# Patient Record
Sex: Male | Born: 1955 | Race: Black or African American | Hispanic: No | Smoking: Current every day smoker
Health system: Southern US, Community
[De-identification: ages and names within clinical notes are randomized; demographics above are authoritative.]

## PROBLEM LIST (undated history)

## (undated) DIAGNOSIS — I639 Cerebral infarction, unspecified: Secondary | ICD-10-CM

## (undated) DIAGNOSIS — I10 Essential (primary) hypertension: Secondary | ICD-10-CM

## (undated) DIAGNOSIS — N182 Chronic kidney disease, stage 2 (mild): Secondary | ICD-10-CM

## (undated) DIAGNOSIS — F1910 Other psychoactive substance abuse, uncomplicated: Secondary | ICD-10-CM

## (undated) DIAGNOSIS — E118 Type 2 diabetes mellitus with unspecified complications: Secondary | ICD-10-CM

## (undated) DIAGNOSIS — I5042 Chronic combined systolic (congestive) and diastolic (congestive) heart failure: Secondary | ICD-10-CM

## (undated) DIAGNOSIS — I428 Other cardiomyopathies: Secondary | ICD-10-CM

## (undated) HISTORY — DX: Chronic kidney disease, stage 2 (mild): N18.2

## (undated) HISTORY — DX: Chronic combined systolic (congestive) and diastolic (congestive) heart failure: I50.42

## (undated) HISTORY — PX: NO PAST SURGERIES: SHX2092

## (undated) HISTORY — DX: Essential (primary) hypertension: I10

## (undated) HISTORY — DX: Other cardiomyopathies: I42.8

---

## 2013-06-17 ENCOUNTER — Inpatient Hospital Stay: Payer: Self-pay | Admitting: Internal Medicine

## 2013-06-17 LAB — CBC
HCT: 46.5 % (ref 40.0–52.0)
HGB: 15.6 g/dL (ref 13.0–18.0)
MCH: 33.3 pg (ref 26.0–34.0)
MCHC: 33.5 g/dL (ref 32.0–36.0)
MCV: 99 fL (ref 80–100)
Platelet: 313 10*3/uL (ref 150–440)
RBC: 4.69 10*6/uL (ref 4.40–5.90)
RDW: 13.3 % (ref 11.5–14.5)
WBC: 4.1 10*3/uL (ref 3.8–10.6)

## 2013-06-17 LAB — COMPREHENSIVE METABOLIC PANEL
ALBUMIN: 3.2 g/dL — AB (ref 3.4–5.0)
ALK PHOS: 118 U/L — AB
ALT: 28 U/L (ref 12–78)
Anion Gap: 6 — ABNORMAL LOW (ref 7–16)
BUN: 8 mg/dL (ref 7–18)
Bilirubin,Total: 0.4 mg/dL (ref 0.2–1.0)
CALCIUM: 9.1 mg/dL (ref 8.5–10.1)
CHLORIDE: 106 mmol/L (ref 98–107)
CO2: 27 mmol/L (ref 21–32)
CREATININE: 0.8 mg/dL (ref 0.60–1.30)
EGFR (Non-African Amer.): >60
Glucose: 191 mg/dL — ABNORMAL HIGH (ref 65–99)
Osmolality: 281 (ref 275–301)
Potassium: 3.9 mmol/L (ref 3.5–5.1)
SGOT(AST): 51 U/L — ABNORMAL HIGH (ref 15–37)
Sodium: 139 mmol/L (ref 136–145)
Total Protein: 7 g/dL (ref 6.4–8.2)

## 2013-06-17 LAB — CK TOTAL AND CKMB (NOT AT ARMC)
CK, TOTAL: 449 U/L — AB
CK, TOTAL: 411 U/L — AB
CK, Total: 407 U/L — ABNORMAL HIGH
CK-MB: 6.2 ng/mL — ABNORMAL HIGH (ref 0.5–3.6)
CK-MB: 6.9 ng/mL — AB (ref 0.5–3.6)
CK-MB: 6.8 ng/mL — ABNORMAL HIGH (ref 0.5–3.6)

## 2013-06-17 LAB — TROPONIN I
TROPONIN-I: 0.22 ng/mL — AB
Troponin-I: 0.23 ng/mL — ABNORMAL HIGH
Troponin-I: 0.22 ng/mL — ABNORMAL HIGH

## 2013-06-17 LAB — PRO B NATRIURETIC PEPTIDE: B-Type Natriuretic Peptide: 3417 pg/mL — ABNORMAL HIGH (ref 0–125)

## 2013-06-18 LAB — LIPID PANEL
CHOLESTEROL: 152 mg/dL (ref 0–200)
HDL Cholesterol: 80 mg/dL — ABNORMAL HIGH (ref 40–60)
Ldl Cholesterol, Calc: 59 mg/dL (ref 0–100)
TRIGLYCERIDES: 67 mg/dL (ref 0–200)
VLDL CHOLESTEROL, CALC: 13 mg/dL (ref 5–40)

## 2013-06-18 LAB — CBC WITH DIFFERENTIAL/PLATELET
Basophil #: 0.1 10*3/uL (ref 0.0–0.1)
Basophil %: 1.3 %
EOS ABS: 0.1 10*3/uL (ref 0.0–0.7)
EOS PCT: 1.4 %
HCT: 46.5 % (ref 40.0–52.0)
HGB: 15.7 g/dL (ref 13.0–18.0)
LYMPHS ABS: 1.5 10*3/uL (ref 1.0–3.6)
Lymphocyte %: 27.1 %
MCH: 33.3 pg (ref 26.0–34.0)
MCHC: 33.7 g/dL (ref 32.0–36.0)
MCV: 99 fL (ref 80–100)
MONO ABS: 0.9 x10 3/mm (ref 0.2–1.0)
Monocyte %: 15.7 %
Neutrophil #: 3.1 10*3/uL (ref 1.4–6.5)
Neutrophil %: 54.5 %
PLATELETS: 345 10*3/uL (ref 150–440)
RBC: 4.7 10*6/uL (ref 4.40–5.90)
RDW: 12.9 % (ref 11.5–14.5)
WBC: 5.6 10*3/uL (ref 3.8–10.6)

## 2013-06-18 LAB — BASIC METABOLIC PANEL
Anion Gap: 6 — ABNORMAL LOW (ref 7–16)
BUN: 16 mg/dL (ref 7–18)
CO2: 30 mmol/L (ref 21–32)
Calcium, Total: 9.5 mg/dL (ref 8.5–10.1)
Chloride: 104 mmol/L (ref 98–107)
Creatinine: 1.08 mg/dL (ref 0.60–1.30)
EGFR (Non-African Amer.): >60
Glucose: 44 mg/dL — ABNORMAL LOW (ref 65–99)
Osmolality: 278 (ref 275–301)
POTASSIUM: 3.2 mmol/L — AB (ref 3.5–5.1)
SODIUM: 140 mmol/L (ref 136–145)

## 2013-06-18 LAB — HEMOGLOBIN A1C: HEMOGLOBIN A1C: 11.9 % — AB (ref 4.2–6.3)

## 2013-06-19 LAB — BASIC METABOLIC PANEL
Anion Gap: 6 — ABNORMAL LOW (ref 7–16)
BUN: 17 mg/dL (ref 7–18)
Calcium, Total: 9 mg/dL (ref 8.5–10.1)
Chloride: 100 mmol/L (ref 98–107)
Co2: 30 mmol/L (ref 21–32)
Creatinine: 0.92 mg/dL (ref 0.60–1.30)
EGFR (African American): >60
Glucose: 138 mg/dL — ABNORMAL HIGH (ref 65–99)
Osmolality: 276 (ref 275–301)
Potassium: 3.9 mmol/L (ref 3.5–5.1)
SODIUM: 136 mmol/L (ref 136–145)

## 2013-06-20 LAB — BASIC METABOLIC PANEL
Anion Gap: 6 — ABNORMAL LOW (ref 7–16)
BUN: 15 mg/dL (ref 7–18)
CHLORIDE: 99 mmol/L (ref 98–107)
CO2: 30 mmol/L (ref 21–32)
CREATININE: 0.86 mg/dL (ref 0.60–1.30)
Calcium, Total: 9 mg/dL (ref 8.5–10.1)
EGFR (Non-African Amer.): >60
Glucose: 145 mg/dL — ABNORMAL HIGH (ref 65–99)
Osmolality: 274 (ref 275–301)
Potassium: 4.3 mmol/L (ref 3.5–5.1)
Sodium: 135 mmol/L — ABNORMAL LOW (ref 136–145)

## 2013-06-20 LAB — MAGNESIUM: Magnesium: 1.5 mg/dL — ABNORMAL LOW

## 2013-06-20 LAB — DIGOXIN LEVEL: Digoxin: 0.57 ng/mL

## 2014-04-27 NOTE — H&P (Signed)
PATIENT NAME:  Shawn [name], Shawn [name] MR#:  [id number] DATE OF BIRTH:  1955/09/06  DATE OF ADMISSION:  06/17/2013  REFERRING PHYSICIAN: Dr. Jasmine December.   FAMILY PHYSICIAN: , [employer].   REASON FOR ADMISSION: New onset congestive heart failure.   HISTORY OF PRESENT ILLNESS: The patient is a 59 year old male with a history of hyperlipidemia, benign hypertension, diabetes, alcohol and tobacco abuse, who presents to the Emergency Room with a 3 to 4 day history of worsening shortness of breath, cough orthopnea, and peripheral edema. In the Emergency Room, the patient was noted to be hypertensive. His EKG suggested lateral ischemia. Chest x-ray suggests pulmonary edema. His troponin is elevated. He denies any previous cardiac history. He does drink alcohol and had at least a pint of alcohol yesterday. Smoked up until this morning. He is now admitted for further evaluation.   PAST MEDICAL HISTORY: 1. Chronic obstructive pulmonary disease/tobacco abuse.  2. Type 2 diabetes, on insulin.  3. Benign hypertension.  4. Hyperlipidemia.  5. Alcohol abuse.   MEDICATIONS: 1. Aspirin 81 mg p.o. daily.  2. Novolin 70/30 insulin 30 units subcutaneous at bedtime.  3. Lipitor 10 mg p.o. daily.  4. Lisinopril 5 mg p.o. daily.   ALLERGIES: No known drug allergies.   SOCIAL HISTORY: The patient has a current history of both alcohol and tobacco abuse.   FAMILY HISTORY: Positive coronary artery disease, diabetes, hypertension, stroke. Negative for prostate or colon cancer.   REVIEW OF SYSTEMS:  CONSTITUTIONAL: No fever or change in weight.  EYES: No blurred or double vision. No glaucoma.  ENT: No tinnitus or hearing loss. No nasal discharge or bleeding. No difficulty swallowing.  RESPIRATORY: The patient has had cough, but denies wheezing or hemoptysis. No painful respirations.  CARDIOVASCULAR: No chest pain or palpitations. No syncope.  GASTROINTESTINAL: No nausea, vomiting, or diarrhea.  No abdominal pain. No change in bowel habits.  GENITOURINARY: No dysuria or hematuria. No incontinence.  ENDOCRINE: No polyuria or polydipsia. No heat or cold intolerance.  HEMATOLOGIC: The patient denies anemia, easy bruising, or bleeding.  LYMPHATIC: No swollen glands.  MUSCULOSKELETAL: The patient denies pain in his neck, back, shoulders, knees, or hips. No gout.  NEUROLOGIC: No numbness or migraines. Denies stroke or seizures.  PSYCHIATRIC: The patient denies anxiety, insomnia or depression.   PHYSICAL EXAMINATION: GENERAL: The patient is in mild respiratory distress.  VITAL SIGNS: Remarkable for a blood pressure of 153/119, heart rate 107, respiratory rate 22, temperature 98, sat 91% on 2 liters of oxygen.  HEENT: Normocephalic, atraumatic. Pupils equally round and reactive to light and accommodation. Extraocular movements are intact. Sclerae are anicteric. Conjunctivae are clear.  OROPHARYNX: Clear.  NECK: Supple without JVD. No adenopathy or thyromegaly is noted.  LUNGS: Revealed decreased breath sounds with basilar rales. No wheezes or rhonchi. No dullness. Respiratory effort is mildly increased.  CARDIAC: Rapid rate with a regular rhythm. Normal S1 and S2. No significant rubs, murmurs or gallops. PMI is nondisplaced. Chest wall is nontender.  ABDOMEN: Soft, nontender, with normoactive bowel sounds. No organomegaly or masses were appreciated. No hernias, bruits were noted.  EXTREMITIES: Revealed 1+ edema, without clubbing or cyanosis. Pulses were 2+ bilaterally.  SKIN: Warm and dry without rash or lesions.  NEUROLOGIC: Cranial nerves II through XII grossly intact. Deep tendon reflexes were symmetric. Motor and sensory exam is nonfocal.   PSYCHIATRIC: Revealed a patient who was alert and oriented to person, place, and time. He was cooperative and used good judgment.   LABORATORY DATA:  EKG revealed sinus tachycardia with lateral T wave inversion, consistent with ischemia. No ST  elevation was noted. Chest x-ray revealed COPD changes with pulmonary edema. His white count was 4.1 with a hemoglobin of 15.6. Glucose 191 with a BUN of 8, creatinine of 0.8 with a GFR of greater than 60. His BNP was 3417 with a troponin of 0.23.   ASSESSMENT: 1. Acute congestive heart failure, presumed systolic.  2. Elevated troponin consistent with non-ST elevation myocardial infarction.  3. Chronic obstructive pulmonary disease/tobacco abuse.  4. Alcohol abuse.  5. Tachycardia.  6. Benign hypertension.  7. Type 2 diabetes.  8. Hyperlipidemia.   PLAN: The patient will be admitted to telemetry as a full code with aspirin, Lovenox, topical nitrates, beta blocker therapy, and IV Lasix. We will continue his lisinopril. We will continue his insulin and add sliding scale insulin as needed. We will follow serial cardiac enzymes and obtain an echocardiogram. We will consult cardiology. CIWA protocol because of his alcohol abuse. Follow-up chest x-ray and labs in the morning including an A1c and a lipid panel. Wean oxygen as tolerated. Further treatment and evaluation will depend upon the patient's progress.   TOTAL TIME SPENT ON THIS PATIENT: 50 minutes.    ____________________________ Leonie Douglas Doy Hutching, MD jds:sg D: 06/17/2013 12:12:21 ET T: 06/17/2013 13:33:49 ET JOB#: [id number]  cc: Leonie Douglas. Doy Hutching, MD, <Dictator> Mallorey Odonell Lennice Sites MD ELECTRONICALLY SIGNED 06/17/2013 16:10

## 2014-04-27 NOTE — Discharge Summary (Signed)
PATIENT NAME:  Shawn [name], Shawn [name] MR#:  [id number] DATE OF BIRTH:  December 01, 1955  DATE OF ADMISSION:  06/17/2013 DATE OF DISCHARGE:  06/20/2013  PRIMARY CARE PHYSICIAN:  [facility].   CARDIOLOGIST: Dionisio David, MD  FINAL DIAGNOSES: 1.  Acute respiratory failure which resolved.  2.  Acute systolic congestive heart failure with severe dilated cardiomyopathy.  3.  Elevated troponin which is demand ischemia.  4.  Accelerated hypertension.  5.  Diabetes.  6.  Hypokalemia.  7.  Hypomagnesemia.  8.  Tobacco abuse.  9.  Alcohol abuse.   MEDICATIONS ON DISCHARGE: Include Lipitor 20 mg half tablet in the morning, aspirin 81 mg daily. One-a-Day multivitamin daily, lisinopril 20 mg daily, Novolin 70/30, 20 units subcutaneous injection twice a day; spironolactone 25 mg daily, digoxin 250 mcg daily, Coreg 6.25 mg twice a day, furosemide 40 mg daily, hydralazine 10 mg 3 times a day.   TREATMENT: Life vest as directed. May remove dressing for cardiac catheterization tomorrow and shower.   DIET: Low-sodium, carbohydrate-controlled diet, regular consistency.   ACTIVITY: As tolerated.   FOLLOWUP: With Dr. Chancy Milroy, cardiology, Friday at 10:00 a.m., 2 to 4 weeks at the [facility]: The patient was admitted 06/17/2013 and discharged 06/20/2013. Came in with new-onset congestive heart failure and was started on IV Lasix. Cardiology consultation was obtained and he was put on CIWA protocol.   LABORATORY, DIAGNOSTIC AND RADIOLOGICAL DATA DURING THE HOSPITAL COURSE: Included a chest x-ray that showed chronic obstructive pulmonary disease, bibasilar atelectasis and/or infiltrates, small bilateral pleural effusions, interstitial edema. CPK 449. Glucose 191, BUN 8, creatinine 0.80, sodium 139, potassium 3.9, chloride 106, CO2 of 27, calcium 9.1. Liver function tests: Alkaline phosphatase 118, ALT 28, AST 51, albumin low at 3.2. BNP 3417, troponin borderline at 0.23. White blood cell count 4.1,  hemoglobin and hematocrit 15.6 and 46.5, platelet count of 313. EKG sinus tachycardia, left atrial enlargement and nonspecific ST-T wave changes laterally. Troponin stayed borderline at 0.22 for the next few readings. Hemoglobin A1c 11.9. LDL 59, HDL 80, triglycerides 67. Repeat chest x-ray showed mild congestive heart failure, increasing size of bilateral pleural effusions, suspected underlying emphysema. Echocardiogram showed an ejection fraction of less than 20%, moderately dilated atrium. Cardiac catheterization: The patient has normal coronaries with severely dilated left ventricle and ejection fraction 10%, Magnesium upon discharge 1.5, potassium 3.5, creatinine 0.86, glucose in the morning 145.   HOSPITAL COURSE PER PROBLEM LIST:  1.  For the patient's acute respiratory failure, the patient was unable to adequately keep oxygen saturations in an acceptable level. The patient needed oxygen supplementation during the hospital course. Upon discharge, pulse oximetry normal range. No need for oxygen upon discharge. Acute respiratory failure had resolved.  2.  Acute systolic congestive heart failure with severe cardiomyopathy, dilated. The patient did have normal coronary arteries via cardiac catheterization. His severely dilated cardiomyopathy is likely secondary to alcohol. He was advised to stop drinking. The patient was diuresed with IV Lasix initially, switched over to oral Lasix. He was started on Coreg. The patient takes lisinopril at home, spironolactone and digoxin and hydralazine was added.  3.  Accelerated hypertension. The patient was started on quite a few new blood pressure medications. Blood pressure upon discharge in the morning was 129/69.  4.  Diabetes. I increased his 70/30 insulin to twice a day, 20 units. His hemoglobin A1c is elevated, poor control as outpatient.  5.  Initial hypokalemia. Since I titrated up his lisinopril  and added spironolactone, he will likely not need potassium  supplementation upon discharge.  6.  Hypomagnesemia, likely secondary to alcohol. This was supplemented prior to discharge.  7.  Tobacco abuse. Smoking cessation counseling was done on admission.  8.  Alcohol abuse. I told him that if he wants to survive he must stop the alcohol. A life vest was prescribed for him prior to going home which should have been fitted prior to going home.   TIME SPENT ON DISCHARGE: 35 minutes.   ____________________________ Tana Conch. Leslye Peer, MD rjw:cs D: 06/20/2013 16:56:53 ET T: 06/20/2013 18:12:30 ET JOB#: [id number]  cc: Tana Conch. Leslye Peer, MD, <Dictator> Dionisio David, MD [place] SIGNED 06/22/2013 16:17

## 2014-04-27 NOTE — Consult Note (Signed)
PATIENT NAME:  Shawn [name], Shawn [name] MR#:  [id number] DATE OF BIRTH:  November 22, 1955  DATE OF CONSULTATION:  06/17/2013  CONSULTING PHYSICIAN:  Dionisio David, MD  INDICATION FOR CONSULTATION: Elevated troponin and chest pain and congestive heart failure.   HISTORY OF PRESENT ILLNESS: This is a 59 year old African American male with a past medical history of hypertension, diabetes, hyperlipidemia, presented to the hospital with few days onset of shortness of breath, orthopnea, PND and swelling of the legs. He was having chest pain today also and, thus, was seen in the Emergency Room where his troponins were elevated. Thus, I was asked to evaluate the patient. The patient denies any chest pain right now. Appears to be very short of breath, sitting up using two pillows.   PAST MEDICAL HISTORY: History of chronic obstructive pulmonary disease, history of EtOH abuse, history of smoking 1 pack per day   SOCIAL HISTORY: He smokes about 1 pack per day as mentioned. Also has a history of drinking 1 pint of beer over the weekend. He is normally followed by Dr. Rosario Jacks.   PHYSICAL EXAMINATION: GENERAL: He is alert and oriented, in mild distress due to shortness of breath.  VITAL SIGNS: Showed respirations of 23. His blood pressure is 120/70. He is afebrile.  NECK:  Reveals 8 cm JVD.  LUNGS: There is crepitation at the bases.  HEART: Tachycardic. Normal S1, S2. No audible murmur.  ABDOMEN: Soft with no tenderness.  EXTREMITIES: 2+ pedal edema.   LABORATORIES AND STUDIES: EKG shows sinus tachycardia at 101 beats per minute, left atrial enlargement, nonspecific ST-T changes. His labs showed troponin of 0.23. BNP of 3417, BUN and creatinine are normal.   ASSESSMENT AND PLAN: The patient has a non-STEMI, congestive heart failure,  hypertension, diabetes, hyperlipidemia, presents now with new onset of congestive heart failure. We will get an echocardiogram. Also we will do a cardiac catheterization to evaluate   coronary artery disease. Agree with current management.   Thank you very much for the referral   ____________________________ Dionisio David, MD sak:sg D: 06/17/2013 12:31:53 ET T: 06/17/2013 13:15:53 ET JOB#: [id number]  cc: Dionisio David, MD, <Dictator> Dionisio David MD ELECTRONICALLY SIGNED 07/18/2013 9:21

## 2014-04-27 NOTE — Discharge Summary (Signed)
PATIENT NAME:  Shawn [name], Shawn [name] MR#:  [id number] DATE OF BIRTH:  [birth year]  DATE OF ADMISSION:  06/17/2013 DATE OF DISCHARGE:  06/20/2013  ADDENDUM   The patient was discharged 06/20/2013. I was informed by the care manager that the patient signed out against medical advice prior to the [facility] arriving. The patient understands the risk that the LifeVest is there to shock him if his heart goes into arrhythmia. The patient will follow up with Dr. Neoma Laming on Thursday at 10 a.m. to try to obtain the LifeVest. We are still waiting approval for the LifeVest from the [place], [place].   ____________________________ Tana Conch. Leslye Peer, MD rjw:gb D: 06/21/2013 15:22:02 ET T: 06/21/2013 23:49:16 ET JOB#: [id number]  cc: Tana Conch. Leslye Peer, MD, <Dictator> [facility] Dionisio David, MD Marisue Brooklyn MD ELECTRONICALLY SIGNED 06/22/2013 16:18

## 2015-07-31 ENCOUNTER — Other Ambulatory Visit: Payer: Self-pay | Admitting: Obstetrics and Gynecology

## 2015-07-31 DIAGNOSIS — I509 Heart failure, unspecified: Secondary | ICD-10-CM

## 2015-08-12 ENCOUNTER — Ambulatory Visit: Payer: Disability Insurance | Attending: Obstetrics and Gynecology

## 2015-08-12 DIAGNOSIS — Z029 Encounter for administrative examinations, unspecified: Secondary | ICD-10-CM | POA: Diagnosis not present

## 2015-08-12 DIAGNOSIS — I509 Heart failure, unspecified: Secondary | ICD-10-CM

## 2015-08-12 MED ORDER — ALBUTEROL SULFATE (2.5 MG/3ML) 0.083% IN NEBU
2.5000 mg | INHALATION_SOLUTION | Freq: Once | RESPIRATORY_TRACT | Status: AC
  Administered 2015-08-12: 2.5 mg via RESPIRATORY_TRACT
  Filled 2015-08-12: qty 3

## 2015-08-21 DIAGNOSIS — I509 Heart failure, unspecified: Secondary | ICD-10-CM

## 2017-01-06 ENCOUNTER — Emergency Department
Admission: EM | Admit: 2017-01-06 | Discharge: 2017-01-06 | Disposition: A | Discharge status: Home / Self Care | Payer: Non-veteran care | Attending: Emergency Medicine | Admitting: Emergency Medicine

## 2017-01-06 ENCOUNTER — Other Ambulatory Visit: Payer: Self-pay

## 2017-01-06 DIAGNOSIS — R112 Nausea with vomiting, unspecified: Secondary | ICD-10-CM | POA: Diagnosis present

## 2017-01-06 DIAGNOSIS — R42 Dizziness and giddiness: Secondary | ICD-10-CM | POA: Diagnosis not present

## 2017-01-06 DIAGNOSIS — W01198A Fall on same level from slipping, tripping and stumbling with subsequent striking against other object, initial encounter: Secondary | ICD-10-CM | POA: Insufficient documentation

## 2017-01-06 DIAGNOSIS — Y998 Other external cause status: Secondary | ICD-10-CM | POA: Diagnosis not present

## 2017-01-06 DIAGNOSIS — I509 Heart failure, unspecified: Secondary | ICD-10-CM | POA: Diagnosis not present

## 2017-01-06 DIAGNOSIS — R739 Hyperglycemia, unspecified: Secondary | ICD-10-CM | POA: Diagnosis not present

## 2017-01-06 DIAGNOSIS — E860 Dehydration: Secondary | ICD-10-CM | POA: Diagnosis not present

## 2017-01-06 DIAGNOSIS — E119 Type 2 diabetes mellitus without complications: Secondary | ICD-10-CM | POA: Insufficient documentation

## 2017-01-06 DIAGNOSIS — F17200 Nicotine dependence, unspecified, uncomplicated: Secondary | ICD-10-CM | POA: Diagnosis not present

## 2017-01-06 DIAGNOSIS — Y929 Unspecified place or not applicable: Secondary | ICD-10-CM | POA: Insufficient documentation

## 2017-01-06 DIAGNOSIS — Y9389 Activity, other specified: Secondary | ICD-10-CM | POA: Insufficient documentation

## 2017-01-06 LAB — URINALYSIS, COMPLETE (UACMP) WITH MICROSCOPIC
Bacteria, UA: NONE SEEN
Bilirubin Urine: NEGATIVE
HGB URINE DIPSTICK: NEGATIVE
KETONES UR: 20 mg/dL — AB
LEUKOCYTES UA: NEGATIVE
Nitrite: NEGATIVE
PH: 5 (ref 5.0–8.0)
Protein, ur: NEGATIVE mg/dL
RBC / HPF: NONE SEEN RBC/hpf (ref 0–5)
Specific Gravity, Urine: 1.023 (ref 1.005–1.030)

## 2017-01-06 LAB — CBC
HCT: 42.7 % (ref 40.0–52.0)
Hemoglobin: 14.8 g/dL (ref 13.0–18.0)
MCH: 32.5 pg (ref 26.0–34.0)
MCHC: 34.7 g/dL (ref 32.0–36.0)
MCV: 93.8 fL (ref 80.0–100.0)
Platelets: 165 10*3/uL (ref 150–440)
RBC: 4.55 MIL/uL (ref 4.40–5.90)
RDW: 13.3 % (ref 11.5–14.5)
WBC: 7.7 10*3/uL (ref 3.8–10.6)

## 2017-01-06 LAB — BASIC METABOLIC PANEL
Anion gap: 11 (ref 5–15)
Anion gap: 10 (ref 5–15)
BUN: 50 mg/dL — AB (ref 6–20)
BUN: 38 mg/dL — AB (ref 6–20)
CALCIUM: 10 mg/dL (ref 8.9–10.3)
CHLORIDE: 91 mmol/L — AB (ref 101–111)
CO2: 36 mmol/L — ABNORMAL HIGH (ref 22–32)
CO2: 33 mmol/L — ABNORMAL HIGH (ref 22–32)
CREATININE: 0.92 mg/dL (ref 0.61–1.24)
Calcium: 9.6 mg/dL (ref 8.9–10.3)
Chloride: 96 mmol/L — ABNORMAL LOW (ref 101–111)
Creatinine, Ser: 1.38 mg/dL — ABNORMAL HIGH (ref 0.61–1.24)
GFR calc Af Amer: >60 mL/min (ref >60)
GFR calc non Af Amer: 54 mL/min — ABNORMAL LOW (ref >60)
Glucose, Bld: 371 mg/dL — ABNORMAL HIGH (ref 65–99)
Glucose, Bld: 151 mg/dL — ABNORMAL HIGH (ref 65–99)
Potassium: 4.3 mmol/L (ref 3.5–5.1)
Potassium: 3.6 mmol/L (ref 3.5–5.1)
SODIUM: 138 mmol/L (ref 135–145)
SODIUM: 139 mmol/L (ref 135–145)

## 2017-01-06 LAB — GLUCOSE, CAPILLARY
GLUCOSE-CAPILLARY: 467 mg/dL — AB (ref 65–99)
GLUCOSE-CAPILLARY: 308 mg/dL — AB (ref 65–99)
Glucose-Capillary: 229 mg/dL — ABNORMAL HIGH (ref 65–99)

## 2017-01-06 LAB — TROPONIN I

## 2017-01-06 MED ORDER — SODIUM CHLORIDE 0.9 % IV BOLUS (SEPSIS)
500.0000 mL | Freq: Once | INTRAVENOUS | Status: AC
  Administered 2017-01-06: 500 mL via INTRAVENOUS

## 2017-01-06 NOTE — ED Provider Notes (Signed)
[facility] Emergency Department Provider Note  ____________________________________________  Time seen: Approximately 9:43 PM  I have reviewed the triage vital signs and the nursing notes.   HISTORY  Chief Complaint Hyperglycemia   HPI Shawn [name] is a 62 y.o. male with a history of CHF, CAD, hypertension, alcohol abuse, smoking, and diabeteswho presents for evaluation of nausea and vomiting. Patient reports that yesterday he felt very nauseated and had 5 or 6 episodes of nonbloody nonbilious emesis. No further episodes of vomiting today. His sugars have been elevated at home since yesterday. This morning he reports feeling very dizzy when he stood up and fell. He hit his head on the wall. No LOC. No headache. He is not on any blood thinners. He is feeling very dehydrated. No fever, no cough or congestion, no dysuria or hematuria, no shortness of breath, no chest pain, no diarrhea. Patient drinks on a daily basis and family thinks that he was vomiting because of alcohol abuse yesterday. Patient is followed at the [place].  Chief Complaint: dehydration, N/V Severity: severe Duration: one day Timing: all day Modifying factors: nothing makes it better or worse Associated signs/symptoms: lightheadedness, fall    Past Medical History:  Diagnosis Date  . CHF (congestive heart failure) ([place])   . Diabetes mellitus without complication ([place])      Prior to Admission medications   Not on File    Allergies Patient has no known allergies.  FH DM HTN Heart disease  Social History Social History   Tobacco Use  . Smoking status: Current Every Day Smoker  Substance Use Topics  . Alcohol use: Yes    Frequency: Never  . Drug use: Not on file    Review of Systems  Constitutional: Negative for fever. + lightheaded Eyes: Negative for visual changes. ENT: Negative for sore throat. Neck: No neck pain  Cardiovascular: Negative for chest  pain. Respiratory: Negative for shortness of breath. Gastrointestinal: Negative for abdominal pain, diarrhea. + N.V Genitourinary: Negative for dysuria. Musculoskeletal: Negative for back pain. Skin: Negative for rash. Neurological: Negative for headaches, weakness or numbness. Psych: No SI or HI  ____________________________________________   PHYSICAL EXAM:  VITAL SIGNS: ED Triage Vitals  Enc Vitals Group     BP 01/06/17 1515 122/64     Pulse Rate 01/06/17 1515 68     Resp 01/06/17 1515 16     Temp 01/06/17 1515 98.1 F (36.7 C)     Temp Source 01/06/17 1515 Oral     SpO2 01/06/17 1515 100 %     Weight 01/06/17 1515 107 lb (48.5 kg)     Height 01/06/17 1515 5\' 11"  (1.803 m)     Head Circumference --      Peak Flow --      Pain Score 01/06/17 1519 0     Pain Loc --      Pain Edu? --      Excl. in [place]? --     Constitutional: Alert and oriented. Well appearing and in no apparent distress. HEENT:      Head: Normocephalic and atraumatic with small abrasion on the R forehead      Eyes: Conjunctivae are normal. Sclera is non-icteric.       Mouth/Throat: Mucous membranes are moist.       Neck: Supple with no signs of meningismus. No c spine ttp Cardiovascular: Regular rate and rhythm. No murmurs, gallops, or rubs. 2+ symmetrical distal pulses are present in all extremities. No  JVD. Respiratory: Normal respiratory effort. Lungs are clear to auscultation bilaterally. No wheezes, crackles, or rhonchi.  Gastrointestinal: Soft, non tender, and non distended with positive bowel sounds. No rebound or guarding. Musculoskeletal: Nontender with normal range of motion in all extremities. No edema, cyanosis, or erythema of extremities. Neurologic: Normal speech and language. Face is symmetric. Moving all extremities. No gross focal neurologic deficits are appreciated. Skin: Skin is warm, dry and intact. No rash noted. Psychiatric: Mood and affect are normal. Speech and behavior are  normal.  ____________________________________________   LABS (all labs ordered are listed, but only abnormal results are displayed)  Labs Reviewed  BASIC METABOLIC PANEL - Abnormal; Notable for the following components:      Result Value   Chloride 91 (*)    CO2 36 (*)    Glucose, Bld 371 (*)    BUN 50 (*)    Creatinine, Ser 1.38 (*)    GFR calc non Af Amer 54 (*)    All other components within normal limits  URINALYSIS, COMPLETE (UACMP) WITH MICROSCOPIC - Abnormal; Notable for the following components:   Color, Urine YELLOW (*)    APPearance HAZY (*)    Glucose, UA >=500 (*)    Ketones, ur 20 (*)    Squamous Epithelial / LPF 0-5 (*)    All other components within normal limits  GLUCOSE, CAPILLARY - Abnormal; Notable for the following components:   Glucose-Capillary 467 (*)    All other components within normal limits  GLUCOSE, CAPILLARY - Abnormal; Notable for the following components:   Glucose-Capillary 308 (*)    All other components within normal limits  GLUCOSE, CAPILLARY - Abnormal; Notable for the following components:   Glucose-Capillary 229 (*)    All other components within normal limits  BASIC METABOLIC PANEL - Abnormal; Notable for the following components:   Chloride 96 (*)    CO2 33 (*)    Glucose, Bld 151 (*)    BUN 38 (*)    All other components within normal limits  CBC  TROPONIN I  CBG MONITORING, ED   ____________________________________________  EKG   ED ECG REPORT I, Rudene Re, the attending physician, personally viewed and interpreted this ECG.  Normal sinus rhythm, rate of 53, normal intervals, normal axis, no ST elevations or depressions, LVH. T-wave inversion seen in 2015 and no longer present ____________________________________________  RADIOLOGY  none ____________________________________________   PROCEDURES  Procedure(s) performed: None Procedures Critical Care performed:   None ____________________________________________   INITIAL IMPRESSION / ASSESSMENT AND PLAN / ED COURSE   62 y.o. male with a history of CHF, CAD, hypertension, alcohol abuse, smoking, and diabeteswho presents for evaluation of nausea and vomiting and elevated glucose in the setting of alcohol abuse. Patient fell this am, GCS 15, per [place] CT Head rule patient does not need CT head. Is not on blood thinners, has no headache, has had no alcohol for greater than 24 hours. Nausea and vomiting preceded the fall and head trauma. Has had no further episodes of vomiting after the head trauma today. Patient looks dry on exam. Labs show hyperglycemia with glucose of 371 and ketonuria, normal CO2 and normal anion gap. Patient has acute kidney injury when compared to labs from 2015. No recent labs done. Patient reports feeling back to baseline after 500 cc. Due to elevated creatinine and clinically patient still looks dry and will give another 500 cc and repeat BMP. Anticipate discharge home.    _________________________ 10:51 PM on 01/06/2017 -----------------------------------------  After 1 L bolus patient's GFR is back to normal, glucose of 151, no signs of DKA. Patient feels back to his baseline. No episodes of vomiting the emergency department. At this time patient is safe for discharge and outpatient follow-up. Discussed return precautions.    As part of my medical decision making, I reviewed the following data within the [facility] notes reviewed and incorporated, Labs reviewed , EKG interpreted , Old EKG reviewed, Notes from prior ED visits and [place] Controlled Substance Database    Pertinent labs & imaging results that were available during my care of the patient were reviewed by me and considered in my medical decision making (see chart for details).    ____________________________________________   FINAL CLINICAL IMPRESSION(S) / ED DIAGNOSES  Final diagnoses:   Hyperglycemia  Dehydration  Non-intractable vomiting with nausea, unspecified vomiting type      NEW MEDICATIONS STARTED DURING THIS VISIT:  ED Discharge Orders    None       Note:  This document was prepared using Dragon voice recognition software and may include unintentional dictation errors.    Rudene Re, MD 01/06/17 2252

## 2017-01-06 NOTE — ED Notes (Signed)
Patient complains of generally not feeling well and states his sugar was up

## 2017-01-06 NOTE — ED Triage Notes (Addendum)
Pt arrives to ED with c/o of multiple falls. Family member states that pt R hands has been shaking x 1 month. States weakness, decrease in eating. States CBG has been elevated at home, in 400's. Wife with husband. States increase in thirst. Wife also states pt has lost 14lbs in 2 weeks.

## 2017-04-27 ENCOUNTER — Inpatient Hospital Stay
Admission: EM | Admit: 2017-04-27 | Discharge: 2017-04-29 | DRG: 637 | Disposition: A | Discharge status: Home Health | Payer: Medicaid Other | Attending: Internal Medicine | Admitting: Internal Medicine

## 2017-04-27 ENCOUNTER — Emergency Department: Payer: Medicaid Other

## 2017-04-27 ENCOUNTER — Other Ambulatory Visit: Payer: Self-pay

## 2017-04-27 DIAGNOSIS — Z8249 Family history of ischemic heart disease and other diseases of the circulatory system: Secondary | ICD-10-CM | POA: Diagnosis not present

## 2017-04-27 DIAGNOSIS — R739 Hyperglycemia, unspecified: Secondary | ICD-10-CM

## 2017-04-27 DIAGNOSIS — N179 Acute kidney failure, unspecified: Secondary | ICD-10-CM | POA: Diagnosis present

## 2017-04-27 DIAGNOSIS — Z833 Family history of diabetes mellitus: Secondary | ICD-10-CM | POA: Diagnosis not present

## 2017-04-27 DIAGNOSIS — E871 Hypo-osmolality and hyponatremia: Secondary | ICD-10-CM | POA: Diagnosis present

## 2017-04-27 DIAGNOSIS — E875 Hyperkalemia: Secondary | ICD-10-CM | POA: Diagnosis present

## 2017-04-27 DIAGNOSIS — I429 Cardiomyopathy, unspecified: Secondary | ICD-10-CM | POA: Diagnosis not present

## 2017-04-27 DIAGNOSIS — Z8673 Personal history of transient ischemic attack (TIA), and cerebral infarction without residual deficits: Secondary | ICD-10-CM

## 2017-04-27 DIAGNOSIS — E43 Unspecified severe protein-calorie malnutrition: Secondary | ICD-10-CM | POA: Diagnosis present

## 2017-04-27 DIAGNOSIS — I110 Hypertensive heart disease with heart failure: Secondary | ICD-10-CM | POA: Diagnosis present

## 2017-04-27 DIAGNOSIS — F1010 Alcohol abuse, uncomplicated: Secondary | ICD-10-CM | POA: Diagnosis present

## 2017-04-27 DIAGNOSIS — Z681 Body mass index (BMI) 19 or less, adult: Secondary | ICD-10-CM

## 2017-04-27 DIAGNOSIS — I420 Dilated cardiomyopathy: Secondary | ICD-10-CM | POA: Diagnosis present

## 2017-04-27 DIAGNOSIS — E1111 Type 2 diabetes mellitus with ketoacidosis with coma: Principal | ICD-10-CM | POA: Diagnosis present

## 2017-04-27 DIAGNOSIS — I10 Essential (primary) hypertension: Secondary | ICD-10-CM | POA: Diagnosis not present

## 2017-04-27 DIAGNOSIS — R4781 Slurred speech: Secondary | ICD-10-CM | POA: Diagnosis present

## 2017-04-27 DIAGNOSIS — R4182 Altered mental status, unspecified: Secondary | ICD-10-CM | POA: Diagnosis not present

## 2017-04-27 DIAGNOSIS — F17210 Nicotine dependence, cigarettes, uncomplicated: Secondary | ICD-10-CM | POA: Diagnosis present

## 2017-04-27 DIAGNOSIS — D7589 Other specified diseases of blood and blood-forming organs: Secondary | ICD-10-CM | POA: Diagnosis present

## 2017-04-27 DIAGNOSIS — I5022 Chronic systolic (congestive) heart failure: Secondary | ICD-10-CM | POA: Diagnosis present

## 2017-04-27 DIAGNOSIS — F1910 Other psychoactive substance abuse, uncomplicated: Secondary | ICD-10-CM | POA: Diagnosis not present

## 2017-04-27 DIAGNOSIS — E860 Dehydration: Secondary | ICD-10-CM | POA: Diagnosis present

## 2017-04-27 DIAGNOSIS — R296 Repeated falls: Secondary | ICD-10-CM | POA: Diagnosis present

## 2017-04-27 DIAGNOSIS — E870 Hyperosmolality and hypernatremia: Secondary | ICD-10-CM | POA: Diagnosis present

## 2017-04-27 DIAGNOSIS — E1110 Type 2 diabetes mellitus with ketoacidosis without coma: Secondary | ICD-10-CM | POA: Diagnosis present

## 2017-04-27 HISTORY — DX: Cerebral infarction, unspecified: I63.9

## 2017-04-27 HISTORY — DX: Type 2 diabetes mellitus with unspecified complications: E11.8

## 2017-04-27 HISTORY — DX: Other psychoactive substance abuse, uncomplicated: F19.10

## 2017-04-27 LAB — URINE DRUG SCREEN, QUALITATIVE (ARMC ONLY)
Amphetamines, Ur Screen: NOT DETECTED
BARBITURATES, UR SCREEN: NOT DETECTED
Benzodiazepine, Ur Scrn: NOT DETECTED
CANNABINOID 50 NG, UR ~~LOC~~: NOT DETECTED
COCAINE METABOLITE, UR ~~LOC~~: NOT DETECTED
MDMA (Ecstasy)Ur Screen: NOT DETECTED
Methadone Scn, Ur: NOT DETECTED
OPIATE, UR SCREEN: NOT DETECTED
PHENCYCLIDINE (PCP) UR S: NOT DETECTED
TRICYCLIC, UR SCREEN: NOT DETECTED

## 2017-04-27 LAB — LACTIC ACID, PLASMA
Lactic Acid, Venous: 2.2 mmol/L (ref 0.5–1.9)
Lactic Acid, Venous: 2 mmol/L (ref 0.5–1.9)

## 2017-04-27 LAB — CBC WITH DIFFERENTIAL/PLATELET
Basophils Absolute: 0 10*3/uL (ref 0–0.1)
Basophils Relative: 0 %
EOS PCT: 0 %
Eosinophils Absolute: 0 10*3/uL (ref 0–0.7)
HCT: 45.1 % (ref 40.0–52.0)
Hemoglobin: 13.9 g/dL (ref 13.0–18.0)
LYMPHS ABS: 0.8 10*3/uL — AB (ref 1.0–3.6)
LYMPHS PCT: 12 %
MCH: 32.1 pg (ref 26.0–34.0)
MCHC: 30.7 g/dL — ABNORMAL LOW (ref 32.0–36.0)
MCV: 104.4 fL — AB (ref 80.0–100.0)
MONO ABS: 0.4 10*3/uL (ref 0.2–1.0)
Monocytes Relative: 6 %
Neutro Abs: 5.7 10*3/uL (ref 1.4–6.5)
Neutrophils Relative %: 82 %
PLATELETS: 333 10*3/uL (ref 150–440)
RBC: 4.32 MIL/uL — AB (ref 4.40–5.90)
RDW: 14.9 % — ABNORMAL HIGH (ref 11.5–14.5)
WBC: 7 10*3/uL (ref 3.8–10.6)

## 2017-04-27 LAB — GLUCOSE, CAPILLARY
Glucose-Capillary: >600 mg/dL (ref 65–99)
Glucose-Capillary: >600 mg/dL (ref 65–99)
Glucose-Capillary: >600 mg/dL (ref 65–99)
Glucose-Capillary: >600 mg/dL (ref 65–99)
Glucose-Capillary: >600 mg/dL (ref 65–99)
Glucose-Capillary: >600 mg/dL (ref 65–99)
Glucose-Capillary: >600 mg/dL (ref 65–99)
Glucose-Capillary: >600 mg/dL (ref 65–99)
Glucose-Capillary: >600 mg/dL (ref 65–99)
Glucose-Capillary: 592 mg/dL (ref 65–99)
Glucose-Capillary: 523 mg/dL (ref 65–99)
Glucose-Capillary: 403 mg/dL — ABNORMAL HIGH (ref 65–99)
Glucose-Capillary: 458 mg/dL — ABNORMAL HIGH (ref 65–99)
Glucose-Capillary: 409 mg/dL — ABNORMAL HIGH (ref 65–99)
Glucose-Capillary: 291 mg/dL — ABNORMAL HIGH (ref 65–99)
Glucose-Capillary: 197 mg/dL — ABNORMAL HIGH (ref 65–99)
Glucose-Capillary: 159 mg/dL — ABNORMAL HIGH (ref 65–99)
Glucose-Capillary: 96 mg/dL (ref 65–99)
Glucose-Capillary: 82 mg/dL (ref 65–99)
Glucose-Capillary: 188 mg/dL — ABNORMAL HIGH (ref 65–99)

## 2017-04-27 LAB — COMPREHENSIVE METABOLIC PANEL
ALBUMIN: 3.3 g/dL — AB (ref 3.5–5.0)
ALK PHOS: 111 U/L (ref 38–126)
ALK PHOS: 113 U/L (ref 38–126)
ALT: 32 U/L (ref 17–63)
ALT: 30 U/L (ref 17–63)
ANION GAP: 25 — AB (ref 5–15)
AST: 79 U/L — ABNORMAL HIGH (ref 15–41)
AST: 59 U/L — AB (ref 15–41)
Albumin: 3.2 g/dL — ABNORMAL LOW (ref 3.5–5.0)
Anion gap: 21 — ABNORMAL HIGH (ref 5–15)
BILIRUBIN TOTAL: 1.2 mg/dL (ref 0.3–1.2)
BILIRUBIN TOTAL: 1.4 mg/dL — AB (ref 0.3–1.2)
BUN: 84 mg/dL — AB (ref 6–20)
BUN: 78 mg/dL — AB (ref 6–20)
CALCIUM: 8.4 mg/dL — AB (ref 8.9–10.3)
CALCIUM: 8.6 mg/dL — AB (ref 8.9–10.3)
CO2: 17 mmol/L — ABNORMAL LOW (ref 22–32)
CO2: 18 mmol/L — ABNORMAL LOW (ref 22–32)
Chloride: 85 mmol/L — ABNORMAL LOW (ref 101–111)
Chloride: 93 mmol/L — ABNORMAL LOW (ref 101–111)
Creatinine, Ser: 2.05 mg/dL — ABNORMAL HIGH (ref 0.61–1.24)
Creatinine, Ser: 1.84 mg/dL — ABNORMAL HIGH (ref 0.61–1.24)
GFR calc Af Amer: 39 mL/min — ABNORMAL LOW (ref >60)
GFR calc Af Amer: 44 mL/min — ABNORMAL LOW (ref >60)
GFR calc non Af Amer: 38 mL/min — ABNORMAL LOW (ref >60)
GFR, EST NON AFRICAN AMERICAN: 34 mL/min — AB (ref >60)
GLUCOSE: 1180 mg/dL — AB (ref 65–99)
Glucose, Bld: 1367 mg/dL (ref 65–99)
POTASSIUM: 6.6 mmol/L — AB (ref 3.5–5.1)
Potassium: 6.6 mmol/L (ref 3.5–5.1)
Sodium: 127 mmol/L — ABNORMAL LOW (ref 135–145)
Sodium: 132 mmol/L — ABNORMAL LOW (ref 135–145)
TOTAL PROTEIN: 5.4 g/dL — AB (ref 6.5–8.1)
TOTAL PROTEIN: 5.4 g/dL — AB (ref 6.5–8.1)

## 2017-04-27 LAB — BASIC METABOLIC PANEL
Anion gap: 17 — ABNORMAL HIGH (ref 5–15)
Anion gap: 11 (ref 5–15)
Anion gap: 4 — ABNORMAL LOW (ref 5–15)
BUN: 73 mg/dL — ABNORMAL HIGH (ref 6–20)
BUN: 64 mg/dL — ABNORMAL HIGH (ref 6–20)
BUN: 52 mg/dL — ABNORMAL HIGH (ref 6–20)
CO2: 22 mmol/L (ref 22–32)
CO2: 27 mmol/L (ref 22–32)
CO2: 32 mmol/L (ref 22–32)
Calcium: 8.6 mg/dL — ABNORMAL LOW (ref 8.9–10.3)
Calcium: 9.1 mg/dL (ref 8.9–10.3)
Calcium: 9.2 mg/dL (ref 8.9–10.3)
Chloride: 99 mmol/L — ABNORMAL LOW (ref 101–111)
Chloride: 107 mmol/L (ref 101–111)
Chloride: 112 mmol/L — ABNORMAL HIGH (ref 101–111)
Creatinine, Ser: 1.92 mg/dL — ABNORMAL HIGH (ref 0.61–1.24)
Creatinine, Ser: 1.36 mg/dL — ABNORMAL HIGH (ref 0.61–1.24)
Creatinine, Ser: 0.9 mg/dL (ref 0.61–1.24)
GFR calc Af Amer: 42 mL/min — ABNORMAL LOW (ref >60)
GFR calc Af Amer: >60 mL/min (ref >60)
GFR calc Af Amer: >60 mL/min (ref >60)
GFR calc non Af Amer: 36 mL/min — ABNORMAL LOW (ref >60)
GFR calc non Af Amer: 55 mL/min — ABNORMAL LOW (ref >60)
GFR calc non Af Amer: >60 mL/min (ref >60)
Glucose, Bld: 946 mg/dL (ref 65–99)
Glucose, Bld: 532 mg/dL (ref 65–99)
Glucose, Bld: 174 mg/dL — ABNORMAL HIGH (ref 65–99)
Potassium: 4.6 mmol/L (ref 3.5–5.1)
Potassium: 4 mmol/L (ref 3.5–5.1)
Potassium: 3.6 mmol/L (ref 3.5–5.1)
Sodium: 138 mmol/L (ref 135–145)
Sodium: 145 mmol/L (ref 135–145)
Sodium: 148 mmol/L — ABNORMAL HIGH (ref 135–145)

## 2017-04-27 LAB — URINALYSIS, ROUTINE W REFLEX MICROSCOPIC
BILIRUBIN URINE: NEGATIVE
Bacteria, UA: NONE SEEN
HGB URINE DIPSTICK: NEGATIVE
Ketones, ur: 20 mg/dL — AB
LEUKOCYTES UA: NEGATIVE
NITRITE: NEGATIVE
Protein, ur: NEGATIVE mg/dL
SPECIFIC GRAVITY, URINE: 1.021 (ref 1.005–1.030)
SQUAMOUS EPITHELIAL / LPF: NONE SEEN (ref 0–5)
WBC, UA: NONE SEEN WBC/hpf (ref 0–5)
pH: 5 (ref 5.0–8.0)

## 2017-04-27 LAB — MAGNESIUM: Magnesium: 3 mg/dL — ABNORMAL HIGH (ref 1.7–2.4)

## 2017-04-27 LAB — BLOOD GAS, VENOUS
Acid-base deficit: 11.3 mmol/L — ABNORMAL HIGH (ref 0.0–2.0)
BICARBONATE: 16.8 mmol/L — AB (ref 20.0–28.0)
O2 Saturation: 74.9 %
PH VEN: 7.18 — AB (ref 7.250–7.430)
PO2 VEN: 51 mmHg — AB (ref 32.0–45.0)
Patient temperature: 37
pCO2, Ven: 45 mmHg (ref 44.0–60.0)

## 2017-04-27 LAB — BETA-HYDROXYBUTYRIC ACID: Beta-Hydroxybutyric Acid: >8 mmol/L — ABNORMAL HIGH (ref 0.05–0.27)

## 2017-04-27 LAB — ETHANOL: Alcohol, Ethyl (B): <10 mg/dL (ref <10)

## 2017-04-27 LAB — MRSA PCR SCREENING: MRSA BY PCR: NEGATIVE

## 2017-04-27 MED ORDER — SODIUM CHLORIDE 0.9 % IV BOLUS: 250.0000 mL | Freq: Once | INTRAVENOUS | Status: DC

## 2017-04-27 MED ORDER — SODIUM CHLORIDE 0.9 % IV SOLN
INTRAVENOUS | Status: DC
  Administered 2017-04-27: 15:00:00 via INTRAVENOUS
  Administered 2017-04-27: 125 mL/h via INTRAVENOUS

## 2017-04-27 MED ORDER — INSULIN ASPART 100 UNIT/ML ~~LOC~~ SOLN
0.0000 [IU] | Freq: Every day | SUBCUTANEOUS | Status: DC
  Filled 2017-04-27: qty 1

## 2017-04-27 MED ORDER — SODIUM CHLORIDE 0.9 % IV SOLN: INTRAVENOUS | Status: AC

## 2017-04-27 MED ORDER — DEXTROSE-NACL 5-0.45 % IV SOLN: INTRAVENOUS | Status: DC

## 2017-04-27 MED ORDER — LACTATED RINGERS IV BOLUS
1000.0000 mL | Freq: Once | INTRAVENOUS | Status: AC
  Administered 2017-04-27: 1000 mL via INTRAVENOUS

## 2017-04-27 MED ORDER — SODIUM CHLORIDE 0.9 % IV SOLN
INTRAVENOUS | Status: DC
  Administered 2017-04-27: 5.4 [IU]/h via INTRAVENOUS
  Administered 2017-04-27: 26.6 [IU]/h via INTRAVENOUS
  Filled 2017-04-27 (×3): qty 1

## 2017-04-27 MED ORDER — SODIUM CHLORIDE 0.9 % IV SOLN
INTRAVENOUS | Status: DC
  Administered 2017-04-27: 03:00:00 via INTRAVENOUS

## 2017-04-27 MED ORDER — SODIUM CHLORIDE 0.9 % IV BOLUS
1000.0000 mL | Freq: Once | INTRAVENOUS | Status: AC
  Administered 2017-04-27: 1000 mL via INTRAVENOUS

## 2017-04-27 MED ORDER — INSULIN GLARGINE 100 UNIT/ML ~~LOC~~ SOLN
15.0000 [IU] | Freq: Every day | SUBCUTANEOUS | Status: DC
  Administered 2017-04-27 – 2017-04-28 (×2): 15 [IU] via SUBCUTANEOUS
  Filled 2017-04-27 (×3): qty 0.15

## 2017-04-27 MED ORDER — POTASSIUM CHLORIDE 10 MEQ/100ML IV SOLN: 10.0000 meq | INTRAVENOUS | Status: DC

## 2017-04-27 MED ORDER — SODIUM CHLORIDE 0.9 % IV SOLN
INTRAVENOUS | Status: DC
  Filled 2017-04-27: qty 1

## 2017-04-27 MED ORDER — DEXTROSE IN LACTATED RINGERS 5 % IV SOLN
INTRAVENOUS | Status: DC
  Administered 2017-04-27: 19:00:00 via INTRAVENOUS

## 2017-04-27 MED ORDER — INSULIN ASPART 100 UNIT/ML ~~LOC~~ SOLN
0.0000 [IU] | Freq: Three times a day (TID) | SUBCUTANEOUS | Status: DC
  Administered 2017-04-28: 3 [IU] via SUBCUTANEOUS
  Administered 2017-04-28 – 2017-04-29 (×3): 2 [IU] via SUBCUTANEOUS
  Filled 2017-04-27 (×4): qty 1

## 2017-04-27 MED ORDER — HEPARIN SODIUM (PORCINE) 5000 UNIT/ML IJ SOLN
5000.0000 [IU] | Freq: Three times a day (TID) | INTRAMUSCULAR | Status: DC
  Administered 2017-04-27 – 2017-04-29 (×7): 5000 [IU] via SUBCUTANEOUS
  Filled 2017-04-27 (×7): qty 1

## 2017-04-27 NOTE — ED Provider Notes (Signed)
[facility] Emergency Department Provider Note  ____________________________________________   First MD Initiated Contact with Patient 04/27/17 0139     (approximate)  I have reviewed the triage vital signs and the nursing notes.   HISTORY  Chief Complaint Hyperglycemia  Level 5 caveat:  history/ROS limited by acute/critical illness  HPI REASON HELZER is a 62 y.o. male with history of insulin-dependent diabetes as well as CHF who presents by EMS for evaluation of high blood sugar as well as altered mental status/confusion.  He has fallen multiple times in the last couple of days but does not seem to have sustained a severe injury.  His wife says he will eat nothing but orange juice and milk.  He became very confused tonight and she was concerned about his relative unresponsiveness so called EMS.  No additional history is available at this time.  The patient is somnolent but will awaken to his name and light touch.  He denies nausea and vomiting and shortness of breath but does report that he has some generalized abdominal pain.  He is unable to provide any additional history or details.  Past Medical History:  Diagnosis Date  . CHF (congestive heart failure) ([place])   . Diabetes mellitus without complication ([place])     There are no active problems to display for this patient.   History reviewed. No pertinent surgical history.  Prior to Admission medications   Not on File    Allergies Patient has no known allergies.  No family history on file.  Social History Social History   Tobacco Use  . Smoking status: Current Every Day Smoker  . Smokeless tobacco: Never Used  Substance Use Topics  . Alcohol use: Not Currently    Frequency: Never  . Drug use: Not on file    Review of Systems Level 5 caveat:  history/ROS limited by acute/critical illness   ____________________________________________   PHYSICAL EXAM:  VITAL SIGNS: ED Triage  Vitals  Enc Vitals Group     BP 04/27/17 0126 (!) 80/49     Pulse Rate 04/27/17 0126 68     Resp 04/27/17 0126 (!) 22     Temp 04/27/17 0127 98.3 F (36.8 C)     Temp Source 04/27/17 0127 Oral     SpO2 04/27/17 0126 91 %     Weight 04/27/17 0129 52.6 kg (116 lb)     Height 04/27/17 0129 1.803 m (5\' 11" )     Head Circumference --      Peak Flow --      Pain Score 04/27/17 0129 0     Pain Loc --      Pain Edu? --      Excl. in [place]? --     Constitutional: Toxic appearance.  Answers questions Eyes: Conjunctivae are normal. PERRL. EOMI. Head: Atraumatic. Nose: No congestion/rhinnorhea.  Dried epistaxis but no evidence of acute injury Mouth/Throat: Mucous membranes are dry Neck: No stridor.  No meningeal signs.   Cardiovascular: Normal rate, regular rhythm. Good peripheral circulation. Grossly normal heart sounds. Respiratory: Increased respiratory rate but no apparent increased effort, lung sounds clear throughout Gastrointestinal: Soft with diffuse tenderness to palpation of the abdomen, no rebound nor guarding Musculoskeletal: No lower extremity tenderness nor edema. No gross deformities of extremities. Neurologic: Somewhat slurred speech, slow to respond, unable to participate in comprehensive neurological exam but moving all 4 extremities Skin:  Skin is warm, dry and intact. No rash noted.   ____________________________________________  LABS (all labs ordered are listed, but only abnormal results are displayed)  Labs Reviewed  GLUCOSE, CAPILLARY - Abnormal; Notable for the following components:      Result Value   Glucose-Capillary >600 (*)    All other components within normal limits  CBC WITH DIFFERENTIAL/PLATELET - Abnormal; Notable for the following components:   RBC 4.32 (*)    MCV 104.4 (*)    MCHC 30.7 (*)    RDW 14.9 (*)    Lymphs Abs 0.8 (*)    All other components within normal limits  LACTIC ACID, PLASMA - Abnormal; Notable for the following components:    Lactic Acid, Venous 2.2 (*)    All other components within normal limits  LACTIC ACID, PLASMA - Abnormal; Notable for the following components:   Lactic Acid, Venous 2.0 (*)    All other components within normal limits  BLOOD GAS, VENOUS - Abnormal; Notable for the following components:   pH, Ven 7.18 (*)    pO2, Ven 51.0 (*)    Bicarbonate 16.8 (*)    Acid-base deficit 11.3 (*)    All other components within normal limits  URINALYSIS, ROUTINE W REFLEX MICROSCOPIC - Abnormal; Notable for the following components:   Color, Urine STRAW (*)    APPearance CLEAR (*)    Glucose, UA >=500 (*)    Ketones, ur 20 (*)    All other components within normal limits  GLUCOSE, CAPILLARY - Abnormal; Notable for the following components:   Glucose-Capillary >600 (*)    All other components within normal limits  BETA-HYDROXYBUTYRIC ACID - Abnormal; Notable for the following components:   Beta-Hydroxybutyric Acid >8.00 (*)    All other components within normal limits  MAGNESIUM - Abnormal; Notable for the following components:   Magnesium 3.0 (*)    All other components within normal limits  COMPREHENSIVE METABOLIC PANEL - Abnormal; Notable for the following components:   Sodium 132 (*)    Potassium 6.6 (*)    Chloride 93 (*)    CO2 18 (*)    Glucose, Bld 1,180 (*)    BUN 78 (*)    Creatinine, Ser 1.84 (*)    Calcium 8.6 (*)    Total Protein 5.4 (*)    Albumin 3.3 (*)    AST 59 (*)    Total Bilirubin 1.4 (*)    GFR calc non Af Amer 38 (*)    GFR calc Af Amer 44 (*)    Anion gap 21 (*)    All other components within normal limits  GLUCOSE, CAPILLARY - Abnormal; Notable for the following components:   Glucose-Capillary >600 (*)    All other components within normal limits  COMPREHENSIVE METABOLIC PANEL - Abnormal; Notable for the following components:   Sodium 127 (*)    Potassium 6.6 (*)    Chloride 85 (*)    CO2 17 (*)    Glucose, Bld 1,367 (*)    BUN 84 (*)    Creatinine, Ser 2.05  (*)    Calcium 8.4 (*)    Total Protein 5.4 (*)    Albumin 3.2 (*)    AST 79 (*)    GFR calc non Af Amer 34 (*)    GFR calc Af Amer 39 (*)    Anion gap 25 (*)    All other components within normal limits  GLUCOSE, CAPILLARY - Abnormal; Notable for the following components:   Glucose-Capillary >600 (*)    All other components within normal  limits  GLUCOSE, CAPILLARY - Abnormal; Notable for the following components:   Glucose-Capillary >600 (*)    All other components within normal limits  GLUCOSE, CAPILLARY - Abnormal; Notable for the following components:   Glucose-Capillary >600 (*)    All other components within normal limits  GLUCOSE, CAPILLARY - Abnormal; Notable for the following components:   Glucose-Capillary >600 (*)    All other components within normal limits  GLUCOSE, CAPILLARY - Abnormal; Notable for the following components:   Glucose-Capillary >600 (*)    All other components within normal limits  MRSA PCR SCREENING  URINE CULTURE  URINE DRUG SCREEN, QUALITATIVE ([facility] ONLY)  ETHANOL  BASIC METABOLIC PANEL  BASIC METABOLIC PANEL  BASIC METABOLIC PANEL  HEMOGLOBIN A1C   ____________________________________________  EKG  ED ECG REPORT I, Hinda Kehr, the attending physician, personally viewed and interpreted this ECG.  Date: 04/27/2017 EKG Time: 1:25 AM Rate: 65 Rhythm: normal sinus rhythm QRS Axis: normal Intervals: normal ST/T Wave abnormalities: Non-specific ST segment / T-wave changes, but no evidence of acute ischemia. Narrative Interpretation: no evidence of acute ischemia   ____________________________________________  RADIOLOGY I, Hinda Kehr, personally viewed and evaluated these images (plain radiographs) as part of my medical decision making, as well as reviewing the written report by the radiologist.  ED MD interpretation: No active lung disease or evidence of pneumonia.  No acute findings on head or cervical spine CTs  Official  radiology report(s): Ct Head Wo Contrast  Result Date: 04/27/2017 CLINICAL DATA:  Difficulty walking and tremors over the past few days. Patient has not been eating. Multiple falls including 3 last evening. EXAM: CT HEAD WITHOUT CONTRAST CT CERVICAL SPINE WITHOUT CONTRAST TECHNIQUE: Multidetector CT imaging of the head and cervical spine was performed following the standard protocol without intravenous contrast. Multiplanar CT image reconstructions of the cervical spine were also generated. COMPARISON:  Report from 08/25/2003 FINDINGS: CT HEAD FINDINGS Brain: Chronic small bifrontal infarcts with encephalomalacia. Age related involutional changes of the brain. No hydrocephalus. No acute large vascular territory infarct, hemorrhage or midline shift. Chronic small vessel ischemic disease of periventricular white matter. No effacement of the basal cisterns or fourth ventricle. Unremarkable cerebellum and brainstem. Vascular: No hyperdense vessel or unexpected calcifications Skull: No acute skull fracture.  No suspicious osseous lesions. Sinuses/Orbits: Clear bilateral mastoids. Intact orbits and globes. No acute sinus disease. Other: None CT CERVICAL SPINE FINDINGS Alignment: Straightening of cervical lordosis which may be due to muscle spasm or patient positioning. Intact craniocervical relationship and atlantodental interval. Skull base and vertebrae: Intact skull base. No cervical spine fracture or listhesis. Soft tissues and spinal canal: No prevertebral fluid or swelling. No visible canal hematoma. Disc levels: Marked disc space narrowing at C4-5 with broad-based central disc bulge. No significant central canal stenosis. There is mild bilateral foraminal stenosis secondary to uncinate spurring. Upper chest: Emphysematous change at the apices with subpleural blebs. Irregular linear opacities at the lung apices more commonly associated with scarring and/or atelectasis. No apparent dominant mass. No effusion or  pneumothorax. Other: None IMPRESSION: 1. Chronic small bifrontal infarcts with encephalomalacia. 2. Chronic small vessel ischemic disease. No acute intracranial abnormality or skull fracture. 3. No acute cervical spine fracture or posttraumatic listhesis. Degenerative disc disease at C5-6 with broad-based central disc bulge and mild bilateral foraminal encroachment from uncinate spurs. Electronically Signed   By: Ashley Royalty M.D.   On: 04/27/2017 02:17   Ct Cervical Spine Wo Contrast  Result Date: 04/27/2017 CLINICAL DATA:  Difficulty  walking and tremors over the past few days. Patient has not been eating. Multiple falls including 3 last evening. EXAM: CT HEAD WITHOUT CONTRAST CT CERVICAL SPINE WITHOUT CONTRAST TECHNIQUE: Multidetector CT imaging of the head and cervical spine was performed following the standard protocol without intravenous contrast. Multiplanar CT image reconstructions of the cervical spine were also generated. COMPARISON:  Report from 08/25/2003 FINDINGS: CT HEAD FINDINGS Brain: Chronic small bifrontal infarcts with encephalomalacia. Age related involutional changes of the brain. No hydrocephalus. No acute large vascular territory infarct, hemorrhage or midline shift. Chronic small vessel ischemic disease of periventricular white matter. No effacement of the basal cisterns or fourth ventricle. Unremarkable cerebellum and brainstem. Vascular: No hyperdense vessel or unexpected calcifications Skull: No acute skull fracture.  No suspicious osseous lesions. Sinuses/Orbits: Clear bilateral mastoids. Intact orbits and globes. No acute sinus disease. Other: None CT CERVICAL SPINE FINDINGS Alignment: Straightening of cervical lordosis which may be due to muscle spasm or patient positioning. Intact craniocervical relationship and atlantodental interval. Skull base and vertebrae: Intact skull base. No cervical spine fracture or listhesis. Soft tissues and spinal canal: No prevertebral fluid or  swelling. No visible canal hematoma. Disc levels: Marked disc space narrowing at C4-5 with broad-based central disc bulge. No significant central canal stenosis. There is mild bilateral foraminal stenosis secondary to uncinate spurring. Upper chest: Emphysematous change at the apices with subpleural blebs. Irregular linear opacities at the lung apices more commonly associated with scarring and/or atelectasis. No apparent dominant mass. No effusion or pneumothorax. Other: None IMPRESSION: 1. Chronic small bifrontal infarcts with encephalomalacia. 2. Chronic small vessel ischemic disease. No acute intracranial abnormality or skull fracture. 3. No acute cervical spine fracture or posttraumatic listhesis. Degenerative disc disease at C5-6 with broad-based central disc bulge and mild bilateral foraminal encroachment from uncinate spurs. Electronically Signed   By: Ashley Royalty M.D.   On: 04/27/2017 02:17   Dg Chest Portable 1 View  Result Date: 04/27/2017 CLINICAL DATA:  Productive cough and weakness EXAM: PORTABLE CHEST 1 VIEW COMPARISON:  06/18/2013 FINDINGS: Shallow inspiration. Normal heart size and pulmonary vascularity. No focal airspace disease or consolidation in the lungs. No blunting of costophrenic angles. No pneumothorax. Mediastinal contours appear intact. IMPRESSION: No active disease. Electronically Signed   By: Lucienne Capers M.D.   On: 04/27/2017 02:01    ____________________________________________   PROCEDURES  Critical Care performed: Yes, see critical care procedure note(s)   Procedure(s) performed:   .Critical Care Performed by: Hinda Kehr, MD Authorized by: Hinda Kehr, MD   Critical care provider statement:    Critical care time (minutes):  45   Critical care time was exclusive of:  Separately billable procedures and treating other patients   Critical care was necessary to treat or prevent imminent or life-threatening deterioration of the following conditions:   Metabolic crisis (DKA)   Critical care was time spent personally by me on the following activities:  Development of treatment plan with patient or surrogate, discussions with consultants, evaluation of patient's response to treatment, examination of patient, obtaining history from patient or surrogate, ordering and performing treatments and interventions, ordering and review of laboratory studies, ordering and review of radiographic studies, pulse oximetry, re-evaluation of patient's condition and review of old charts     ____________________________________________   INITIAL IMPRESSION / [place] / ED COURSE  As part of my medical decision making, I reviewed the following data within the electronic MEDICAL RECORD NUMBER History obtained from family, Nursing notes reviewed and incorporated,  Labs reviewed , EKG interpreted , Old chart reviewed and Discussed with admitting physician     Differential diagnosis includes, but is not limited to, DKA/HHNS, CVA, acute intracranial bleeding, sepsis, ACS.  His initial presentation is definitely most consistent with DKA and he is hypotensive in spite of not having any tachycardia.  I think he is severely volume depleted with a fingerstick blood glucose which is on measurably high.  Given his altered mental status and report of some falls recently I will check an emergent head and C-spine CT scan as well as a chest x-ray to rule out intracranial bleeding or obvious CVA, but I believe this is a metabolic crisis.  He has had no infectious symptoms recently and I do not think he would benefit from empiric antibiotics.  I will start aggressive fluid resuscitation given not just the presumed diagnosis of DKA/HH NS but also because of his hypotension.  He is protecting his airway at this time and I do not feel he requires intubation but we will monitor him carefully.   Clinical Course as of Apr 28 426  Wed Apr 27, 2017  0227 CT Head Wo Contrast [CF]  0227  No evidence of acute findings on CT head nor C-spine  CT Cervical Spine Wo Contrast [CF]  0228 CBC reassuring  CBC with Differential(!) [CF]  0301 Unfortunately still do not have a CMP result.  I do not feel comfortable providing insulin until after I know what the patient's potassium level is.  It appears that there was some issue in the lab and I have asked his nurse to follow-up to make sure they have the samples needed to get the results so that I can continue treatment.  The patient is a [place] patient and will likely require transfer if possible to the [facility].   [CF]  [place] Still awaiting CMP.  Patient remains hypotensive but alert and stable.   [CF]  [phone] Lab results are still not been released but verbally I was told that the patient's potassium is 6.6, creatinine is 2, glucose is still being diluted, and anion gap is 25.  I am initiating insulin treatment and will contact the [facility] for transfer.   [CF]  0418 I updated the patient and wife and explained that he is critically ill with diabetic ketoacidosis.  I explained that for patient with [employer] benefits he would require transfer to the [place] for guarantee of full benefits, but the wife says that she wants him to stay here.  I clarified and she said that that would definitely be best for her and that is her preference.  My secretary is checking with the [facility] to see if they are on diversion but I am going to proceed with admission here.  Additionally, he is quite arguably unstable for transfer given how critically ill he is at this point and I think it is in his best interest to stay because he could have a critical decline during the transfer process.  I have paged the hospitalist service.   [CF]  0424 Talked with Dr. Marcille Blanco, discussed case, he will admit   [CF]  [phone] Verify that the [facility] is on diversion and we should proceed with admission at this facility   [CF]    Clinical Course User Index [CF] Hinda Kehr, MD   In summary, while in the  emergency department the patient received 2 L of normal saline, 1 L of lactated Ringer, and IV insulin apical to begin his  resuscitation and treatment.  His labs were notable for a potassium of 6.6 and a glucose of greater than 1300.  He was critically ill at the time of admission and transfer to the ICU, but no worse than prior and stable for ICU management.    ____________________________________________  FINAL CLINICAL IMPRESSION(S) / ED DIAGNOSES  Final diagnoses:  Diabetic ketoacidosis with coma associated with type 2 diabetes mellitus ([place])  Altered mental status, unspecified altered mental status type     MEDICATIONS GIVEN DURING THIS VISIT:  Medications  sodium chloride 0.9 % bolus 1,000 mL (0 mLs Intravenous Stopped 04/27/17 0230)    And  0.9 %  sodium chloride infusion (0 mL/hr Intravenous Stopping Infusion hung by another clincian 04/27/17 0529)  0.9 %  sodium chloride infusion (has no administration in time range)  0.9 %  sodium chloride infusion ( Intravenous Rate/Dose Verify 04/27/17 0600)  dextrose 5 %-0.45 % sodium chloride infusion (has no administration in time range)  insulin regular (NOVOLIN R,HUMULIN R) 100 Units in sodium chloride 0.9 % 100 mL (1 Units/mL) infusion (16.2 Units/hr Intravenous Rate/Dose Change 04/27/17 0948)  heparin injection 5,000 Units (5,000 Units Subcutaneous Given 04/27/17 0544)  sodium chloride 0.9 % bolus 250 mL (has no administration in time range)  sodium chloride 0.9 % bolus 1,000 mL (0 mLs Intravenous Stopped 04/27/17 0344)  lactated ringers bolus 1,000 mL (0 mLs Intravenous Stopped 04/27/17 0503)     ED Discharge Orders    None       Note:  This document was prepared using Dragon voice recognition software and may include unintentional dictation errors.    Hinda Kehr, MD 04/27/17 [phone]

## 2017-04-27 NOTE — Progress Notes (Signed)
Lab called with a critical K of 6.6. Hinton Dyer, NP notified. No new orders at this time.

## 2017-04-27 NOTE — ED Notes (Signed)
Pt wife Dub Mikes in rm, stating "last few days he has had shakes and difficulty walking, he hasn't been eating, all I can do it get him to drink orange juice." Wife reports pt has had mult falls including 3 last night. Pt has dried blood noted to his nose as well as abrasion to left knee. Wife states pt is an everyday smoker however has not smoked in the last 3 days as well. Wife states pt has been taking his insulin (novolog and lantus) as normal.

## 2017-04-27 NOTE — ED Notes (Addendum)
Lab called about CMP, states 20 more mins for results - EDP aware

## 2017-04-27 NOTE — Care Management (Signed)
RNCM spoke with patient's wife. ED attempted to get patient to [facility] however they did not have any beds.  Patient's wife runs some group homes and is waiting on relief in order to come back to the hospital.  Wife takes patient to [place] every 3 months to see PCP.  Patient gets all of his medications through [place] also.  Glucometer will need to be picked up also from [place]- wife agrees and will reach out to [place] for assistance.

## 2017-04-27 NOTE — Consult Note (Signed)
Name: Shawn [name] MRN: [id number] DOB: Aug 09, 1955    ADMISSION DATE:  04/27/2017 CONSULTATION DATE: 04/27/2017  REFERRING MD : Dr. Marcille Blanco  CHIEF COMPLAINT: Hyperglycemia   BRIEF PATIENT DESCRIPTION:  62 yo male admitted with multiple falls and DKA requiring insulin gtt   SIGNIFICANT EVENTS  04/24-Pt admitted to stepdown unit   STUDIES:  CT Head and Cervical Spine 04/24>>Chronic small bifrontal infarcts with encephalomalacia. Chronic small vessel ischemic disease. No acute intracranial abnormality or skull fracture. No acute cervical spine fracture or posttraumatic listhesis. Degenerative disc disease at C5-6 with broad-based central disc bulge and mild bilateral foraminal encroachment from uncinate spurs.  HISTORY OF PRESENT ILLNESS:   This is a 62 yo male with a PMH of Diabetes Mellitus and CHF.  He presented to [facility] ER via EMS on 04/24 from home with hyperglycemia, poor po intake, and lethargy over the last few days.  According to pts wife he had multiple falls on 04/23. The pt has been receiving his lantus and novolog as prescribed, however he remained hyperglycemic. In the ER lab results ruled pt in for DKA, therefore insulin gtt ordered.  CT Head and Cervical Spine negative for acute intracranial abnormality, skull fracture, and acute cervical spine fracture and CXR negative.  He was subsequently admitted to the stepdown unit by hospitalist team for further workup and treatment.   PAST MEDICAL HISTORY :   has a past medical history of CHF (congestive heart failure) ([place]) and Diabetes mellitus without complication ([place]).  has no past surgical history on file. Prior to Admission medications   Medication Sig Start Date End Date Taking? Authorizing Provider  insulin aspart (NOVOLOG) 100 UNIT/ML injection Inject 5 Units into the skin 3 (three) times daily with meals.   Yes [provider]  insulin glargine (LANTUS) 100 UNIT/ML injection Inject 15 Units into the skin at  bedtime.   Yes [provider]   No Known Allergies  FAMILY HISTORY:  family history is not on file. SOCIAL HISTORY:  reports that he has been smoking.  He has never used smokeless tobacco. He reports that he drank alcohol.  REVIEW OF SYSTEMS: Positives in BOLD Constitutional: poor po intake, fever, chills, weight loss, malaise/fatigue and diaphoresis.  HENT: Negative for hearing loss, ear pain, nosebleeds, congestion, sore throat, neck pain, tinnitus and ear discharge.   Eyes: Negative for blurred vision, double vision, photophobia, pain, discharge and redness.  Respiratory: Negative for cough, hemoptysis, sputum production, shortness of breath, wheezing and stridor.   Cardiovascular: Negative for chest pain, palpitations, orthopnea, claudication, leg swelling and PND.  Gastrointestinal: Negative for heartburn, nausea, vomiting, abdominal pain, diarrhea, constipation, blood in stool and melena.  Genitourinary: Negative for dysuria, urgency, frequency, hematuria and flank pain.  Musculoskeletal: myalgias, back pain, joint pain and falls.  Skin: Negative for itching and rash.  Neurological: Negative for dizziness, tingling, tremors, sensory change, speech change, focal weakness, seizures, loss of consciousness, weakness and headaches.  Endo/Heme/Allergies: Negative for environmental allergies and polydipsia. Does not bruise/bleed easily.  SUBJECTIVE:  No complaints at this time  VITAL SIGNS: Temp:  [98.3 F (36.8 C)] 98.3 F (36.8 C) (04/24 0127) Pulse Rate:  [66-78] 70 (04/24 0440) Resp:  [13-23] 22 (04/24 0440) BP: (77-96)/(49-60) 94/60 (04/24 0440) SpO2:  [91 %-100 %] 95 % (04/24 0440) Weight:  [52.6 kg (116 lb)] 52.6 kg (116 lb) (04/24 0129)  PHYSICAL EXAMINATION: General: well developed male resting in bed, NAD Neuro: lethargic, disoriented to time and situation, follows commands  HEENT: supple, no JVD Cardiovascular: nsr, rrr, no R/G  Lungs: diminished  throughout, even, non labored  Abdomen: +BS x4, soft, non tender, non distended Musculoskeletal: normal bulk and tone, no edema  Skin: intact no rashes or lesions   Recent Labs  Lab 04/27/17 0206  NA 127*  K 6.6*  CL 85*  CO2 17*  BUN 84*  CREATININE 2.05*  GLUCOSE 1,367*   Recent Labs  Lab 04/27/17 0132  HGB 13.9  HCT 45.1  WBC 7.0  PLT 333   Ct Head Wo Contrast  Result Date: 04/27/2017 CLINICAL DATA:  Difficulty walking and tremors over the past few days. Patient has not been eating. Multiple falls including 3 last evening. EXAM: CT HEAD WITHOUT CONTRAST CT CERVICAL SPINE WITHOUT CONTRAST TECHNIQUE: Multidetector CT imaging of the head and cervical spine was performed following the standard protocol without intravenous contrast. Multiplanar CT image reconstructions of the cervical spine were also generated. COMPARISON:  Report from 08/25/2003 FINDINGS: CT HEAD FINDINGS Brain: Chronic small bifrontal infarcts with encephalomalacia. Age related involutional changes of the brain. No hydrocephalus. No acute large vascular territory infarct, hemorrhage or midline shift. Chronic small vessel ischemic disease of periventricular white matter. No effacement of the basal cisterns or fourth ventricle. Unremarkable cerebellum and brainstem. Vascular: No hyperdense vessel or unexpected calcifications Skull: No acute skull fracture.  No suspicious osseous lesions. Sinuses/Orbits: Clear bilateral mastoids. Intact orbits and globes. No acute sinus disease. Other: None CT CERVICAL SPINE FINDINGS Alignment: Straightening of cervical lordosis which may be due to muscle spasm or patient positioning. Intact craniocervical relationship and atlantodental interval. Skull base and vertebrae: Intact skull base. No cervical spine fracture or listhesis. Soft tissues and spinal canal: No prevertebral fluid or swelling. No visible canal hematoma. Disc levels: Marked disc space narrowing at C4-5 with broad-based  central disc bulge. No significant central canal stenosis. There is mild bilateral foraminal stenosis secondary to uncinate spurring. Upper chest: Emphysematous change at the apices with subpleural blebs. Irregular linear opacities at the lung apices more commonly associated with scarring and/or atelectasis. No apparent dominant mass. No effusion or pneumothorax. Other: None IMPRESSION: 1. Chronic small bifrontal infarcts with encephalomalacia. 2. Chronic small vessel ischemic disease. No acute intracranial abnormality or skull fracture. 3. No acute cervical spine fracture or posttraumatic listhesis. Degenerative disc disease at C5-6 with broad-based central disc bulge and mild bilateral foraminal encroachment from uncinate spurs. Electronically Signed   By: Ashley Royalty M.D.   On: 04/27/2017 02:17   Ct Cervical Spine Wo Contrast  Result Date: 04/27/2017 CLINICAL DATA:  Difficulty walking and tremors over the past few days. Patient has not been eating. Multiple falls including 3 last evening. EXAM: CT HEAD WITHOUT CONTRAST CT CERVICAL SPINE WITHOUT CONTRAST TECHNIQUE: Multidetector CT imaging of the head and cervical spine was performed following the standard protocol without intravenous contrast. Multiplanar CT image reconstructions of the cervical spine were also generated. COMPARISON:  Report from 08/25/2003 FINDINGS: CT HEAD FINDINGS Brain: Chronic small bifrontal infarcts with encephalomalacia. Age related involutional changes of the brain. No hydrocephalus. No acute large vascular territory infarct, hemorrhage or midline shift. Chronic small vessel ischemic disease of periventricular white matter. No effacement of the basal cisterns or fourth ventricle. Unremarkable cerebellum and brainstem. Vascular: No hyperdense vessel or unexpected calcifications Skull: No acute skull fracture.  No suspicious osseous lesions. Sinuses/Orbits: Clear bilateral mastoids. Intact orbits and globes. No acute sinus disease.  Other: None CT CERVICAL SPINE FINDINGS Alignment: Straightening of cervical lordosis which  may be due to muscle spasm or patient positioning. Intact craniocervical relationship and atlantodental interval. Skull base and vertebrae: Intact skull base. No cervical spine fracture or listhesis. Soft tissues and spinal canal: No prevertebral fluid or swelling. No visible canal hematoma. Disc levels: Marked disc space narrowing at C4-5 with broad-based central disc bulge. No significant central canal stenosis. There is mild bilateral foraminal stenosis secondary to uncinate spurring. Upper chest: Emphysematous change at the apices with subpleural blebs. Irregular linear opacities at the lung apices more commonly associated with scarring and/or atelectasis. No apparent dominant mass. No effusion or pneumothorax. Other: None IMPRESSION: 1. Chronic small bifrontal infarcts with encephalomalacia. 2. Chronic small vessel ischemic disease. No acute intracranial abnormality or skull fracture. 3. No acute cervical spine fracture or posttraumatic listhesis. Degenerative disc disease at C5-6 with broad-based central disc bulge and mild bilateral foraminal encroachment from uncinate spurs. Electronically Signed   By: Ashley Royalty M.D.   On: 04/27/2017 02:17   Dg Chest Portable 1 View  Result Date: 04/27/2017 CLINICAL DATA:  Productive cough and weakness EXAM: PORTABLE CHEST 1 VIEW COMPARISON:  06/18/2013 FINDINGS: Shallow inspiration. Normal heart size and pulmonary vascularity. No focal airspace disease or consolidation in the lungs. No blunting of costophrenic angles. No pneumothorax. Mediastinal contours appear intact. IMPRESSION: No active disease. Electronically Signed   By: Lucienne Capers M.D.   On: 04/27/2017 02:01    ASSESSMENT / PLAN: Diabetic Ketoacidosis  Hyponatremia and Pseudohyperkalemia in setting of DKA  Acute renal failure secondary to volume depletion  Transaminitis  P: Continue insulin gtt until anion  gap closed and serum CO2 >20 BMP q4hrs and CBG q1hr while on insulin gtt  Continue iv fluids per DKA protocol  Trend CMP Replace electrolytes as indicated  Monitor UOP  Trend lactic acid Trend WBC and monitor fever curve Will check PCT  Follow urine culture  VTE px: subq heparin  Trend CBC Monitor for s/sx of bleeding  Transfuse for hgb <7   Marda Stalker, [place] Pager [phone] (please enter 7 digits) PCCM Consult Pager [phone] (please enter 7 digits)

## 2017-04-27 NOTE — ED Notes (Signed)
EDP aware of pt's symptoms, verbal for 1L fluid bolus

## 2017-04-27 NOTE — ED Triage Notes (Signed)
Per EMS, pt from home with reports of high blood sugar. Per EMS their glucometer read HIGH. EMS states pt's wife stated pt has not been eating as much lately and was lethargic so she gave him orange juice. Pt A&Ox3, disoriented to time. Pt's BP on arrival 82/55, blood sugar check reads high on arrival.

## 2017-04-27 NOTE — Progress Notes (Addendum)
Inpatient Diabetes Program Recommendations  AACE/ADA: New Consensus Statement on Inpatient Glycemic Control (2015)  Target Ranges:  Prepandial:   less than 140 mg/dL      Peak postprandial:   less than 180 mg/dL (1-2 hours)      Critically ill patients:  140 - 180 mg/dL   Results for Shawn [name], Shawn [name] (MRN [id number]) as of 04/27/2017 07:56  Ref. Range 04/27/2017 02:06 04/27/2017 05:29  Glucose Latest Ref Range: 65 - 99 mg/dL 1,367 [facility]) 1,180 [facility])  Results for Shawn [name], Shawn [name] (MRN [id number]) as of 04/27/2017 07:56  Ref. Range 04/27/2017 05:29  CO2 Latest Ref Range: 22 - 32 mmol/L 18 (L)  Results for Shawn [name], Shawn [name] (MRN [id number]) as of 04/27/2017 07:56  Ref. Range 04/27/2017 05:29  Anion gap Latest Ref Range: 5 - 15  21 (H)   Results for Shawn [name], Shawn [name] (MRN [id number]) as of 04/27/2017 07:56  Ref. Range 06/18/2013 04:27  Hemoglobin A1C Latest Ref Range: 4.2 - 6.3 % 11.9 (H)   Review of Glycemic Control  Diabetes history: DM Outpatient Diabetes medications: Lantus 15 units QHS, Novolog 5 units TID with meals Current orders for Inpatient glycemic control: IV insulin drip per DKA protocol  Inpatient Diabetes Program Recommendations:   Insulin - IV drip/GlucoStabilizer: Patient continues to require IV insulin at this time. IV insulin should be continued until glucose improved and acidosis cleared.  Insulin - Basal: Once acidosis is cleared and patient is ready to transtion from IV to SQ insulin, please consider ordering Lantus 15 units Q24H (based on 52 kg x 0.3 units). Correction (SSI): Once acidosis is cleared and patient is ready to transtion from IV to SQ insulin, please consider ordering CBGs Q4H with Novolog 0-9 units Q4H. HgbA1C: Last A1C in the chart 11.9% on 06/18/13.  Current A1C in process.  Thanks, Barnie Alderman, RN, MSN, CDE Diabetes Coordinator Inpatient Diabetes Program [phone] (Team Pager from 8am to 5pm)

## 2017-04-27 NOTE — ED Notes (Signed)
Patient transported to CT 

## 2017-04-27 NOTE — ED Notes (Signed)
Insulin drip arrived from pharmacy, this RN walking it with pt to ICU rm 14.

## 2017-04-27 NOTE — Progress Notes (Signed)
[facility]                                                                                                                                                                                  Patient Demographics   Shawn [name], is a 62 y.o. male, DOB - 03-05-1955, ERX:[id number]  Admit date - 04/27/2017   Admitting Physician Harrie Foreman, MD  Outpatient Primary MD for the patient is System, Pcp Not In   LOS - 0  Subjective: Patient admitted with DKA continues to be on insulin drip. Currently denies any symptom   Review of Systems:   CONSTITUTIONAL: No documented fever. No fatigue, weakness. No weight gain, no weight loss.  EYES: No blurry or double vision.  ENT: No tinnitus. No postnasal drip. No redness of the oropharynx.  RESPIRATORY: No cough, no wheeze, no hemoptysis. No dyspnea.  CARDIOVASCULAR: No chest pain. No orthopnea. No palpitations. No syncope.  GASTROINTESTINAL: No nausea, no vomiting or diarrhea. No abdominal pain. No melena or hematochezia.  GENITOURINARY: No dysuria or hematuria.  ENDOCRINE: No polyuria or nocturia. No heat or cold intolerance.  HEMATOLOGY: No anemia. No bruising. No bleeding.  INTEGUMENTARY: No rashes. No lesions.  MUSCULOSKELETAL: No arthritis. No swelling. No gout.  NEUROLOGIC: No numbness, tingling, or ataxia. No seizure-type activity.  PSYCHIATRIC: No anxiety. No insomnia. No ADD.    Vitals:   Vitals:   04/27/17 0600 04/27/17 0800 04/27/17 0900 04/27/17 1000  BP: (!) 87/56 (!) 86/56 (!) 89/59 96/62  Pulse: 68 67 65 63  Resp: 19  14 16   Temp:  97.9 F (36.6 C)    TempSrc:  Oral    SpO2: 94% 93% 94% 94%  Weight:      Height: 5\' 11"  (1.803 m)       Wt Readings from Last 3 Encounters:  04/27/17 52.6 kg (116 lb)  01/06/17 48.5 kg (107 lb)     Intake/Output Summary (Last 24 hours) at 04/27/2017 1451 Last data filed at 04/27/2017 0800 Gross per 24 hour  Intake 769.88 ml  Output 1100  ml  Net -330.12 ml    Physical Exam:   GENERAL: Pleasant-appearing in no apparent distress.  HEAD, EYES, EARS, NOSE AND THROAT: Atraumatic, normocephalic. Extraocular muscles are intact. Pupils equal and reactive to light. Sclerae anicteric. No conjunctival injection. No oro-pharyngeal erythema.  NECK: Supple. There is no jugular venous distention. No bruits, no lymphadenopathy, no thyromegaly.  HEART: Regular rate and rhythm,. No murmurs, no rubs, no clicks.  LUNGS: Clear to auscultation bilaterally. No rales or rhonchi. No wheezes.  ABDOMEN: Soft, flat, nontender, nondistended. Has good bowel sounds. No hepatosplenomegaly appreciated.  EXTREMITIES: No evidence of any cyanosis, clubbing, or peripheral edema.  +2 pedal and radial pulses bilaterally.  NEUROLOGIC: The patient is alert, awake, and oriented x3 with no focal motor or sensory deficits appreciated bilaterally.  SKIN: Moist and warm with no rashes appreciated.  Psych: Not anxious, depressed LN: No inguinal LN enlargement    Antibiotics   Anti-infectives (From admission, onward)   None      Medications   Scheduled Meds: . heparin  5,000 Units Subcutaneous Q8H   Continuous Infusions: . sodium chloride Stopped (04/27/17 0529)  . sodium chloride 125 mL/hr at 04/27/17 0800  . dextrose 5 % and 0.45% NaCl    . insulin (NOVOLIN-R) infusion 20.6 Units/hr (04/27/17 1401)  . sodium chloride     PRN Meds:.   Data Review:   Micro Results Recent Results (from the past 240 hour(s))  MRSA PCR Screening     Status: None   Collection Time: 04/27/17  5:32 AM  Result Value Ref Range Status   MRSA by PCR NEGATIVE NEGATIVE Final    Comment:        The GeneXpert MRSA Assay (FDA approved for NASAL specimens only), is one component of a comprehensive MRSA colonization surveillance program. It is not intended to diagnose MRSA infection nor to guide or monitor treatment for MRSA infections. Performed at [facility],  [address], [place], [place] [address]     Radiology Reports Ct Head Wo Contrast  Result Date: 04/27/2017 CLINICAL DATA:  Difficulty walking and tremors over the past few days. Patient has not been eating. Multiple falls including 3 last evening. EXAM: CT HEAD WITHOUT CONTRAST CT CERVICAL SPINE WITHOUT CONTRAST TECHNIQUE: Multidetector CT imaging of the head and cervical spine was performed following the standard protocol without intravenous contrast. Multiplanar CT image reconstructions of the cervical spine were also generated. COMPARISON:  Report from 08/25/2003 FINDINGS: CT HEAD FINDINGS Brain: Chronic small bifrontal infarcts with encephalomalacia. Age related involutional changes of the brain. No hydrocephalus. No acute large vascular territory infarct, hemorrhage or midline shift. Chronic small vessel ischemic disease of periventricular white matter. No effacement of the basal cisterns or fourth ventricle. Unremarkable cerebellum and brainstem. Vascular: No hyperdense vessel or unexpected calcifications Skull: No acute skull fracture.  No suspicious osseous lesions. Sinuses/Orbits: Clear bilateral mastoids. Intact orbits and globes. No acute sinus disease. Other: None CT CERVICAL SPINE FINDINGS Alignment: Straightening of cervical lordosis which may be due to muscle spasm or patient positioning. Intact craniocervical relationship and atlantodental interval. Skull base and vertebrae: Intact skull base. No cervical spine fracture or listhesis. Soft tissues and spinal canal: No prevertebral fluid or swelling. No visible canal hematoma. Disc levels: Marked disc space narrowing at C4-5 with broad-based central disc bulge. No significant central canal stenosis. There is mild bilateral foraminal stenosis secondary to uncinate spurring. Upper chest: Emphysematous change at the apices with subpleural blebs. Irregular linear opacities at the lung apices more commonly associated with scarring and/or  atelectasis. No apparent dominant mass. No effusion or pneumothorax. Other: None IMPRESSION: 1. Chronic small bifrontal infarcts with encephalomalacia. 2. Chronic small vessel ischemic disease. No acute intracranial abnormality or skull fracture. 3. No acute cervical spine fracture or posttraumatic listhesis. Degenerative disc disease at C5-6 with broad-based central disc bulge and mild bilateral foraminal encroachment from uncinate spurs. Electronically Signed   By: Ashley Royalty M.D.   On: 04/27/2017 02:17   Ct Cervical Spine Wo Contrast  Result Date: 04/27/2017 CLINICAL DATA:  Difficulty walking and  tremors over the past few days. Patient has not been eating. Multiple falls including 3 last evening. EXAM: CT HEAD WITHOUT CONTRAST CT CERVICAL SPINE WITHOUT CONTRAST TECHNIQUE: Multidetector CT imaging of the head and cervical spine was performed following the standard protocol without intravenous contrast. Multiplanar CT image reconstructions of the cervical spine were also generated. COMPARISON:  Report from 08/25/2003 FINDINGS: CT HEAD FINDINGS Brain: Chronic small bifrontal infarcts with encephalomalacia. Age related involutional changes of the brain. No hydrocephalus. No acute large vascular territory infarct, hemorrhage or midline shift. Chronic small vessel ischemic disease of periventricular white matter. No effacement of the basal cisterns or fourth ventricle. Unremarkable cerebellum and brainstem. Vascular: No hyperdense vessel or unexpected calcifications Skull: No acute skull fracture.  No suspicious osseous lesions. Sinuses/Orbits: Clear bilateral mastoids. Intact orbits and globes. No acute sinus disease. Other: None CT CERVICAL SPINE FINDINGS Alignment: Straightening of cervical lordosis which may be due to muscle spasm or patient positioning. Intact craniocervical relationship and atlantodental interval. Skull base and vertebrae: Intact skull base. No cervical spine fracture or listhesis. Soft  tissues and spinal canal: No prevertebral fluid or swelling. No visible canal hematoma. Disc levels: Marked disc space narrowing at C4-5 with broad-based central disc bulge. No significant central canal stenosis. There is mild bilateral foraminal stenosis secondary to uncinate spurring. Upper chest: Emphysematous change at the apices with subpleural blebs. Irregular linear opacities at the lung apices more commonly associated with scarring and/or atelectasis. No apparent dominant mass. No effusion or pneumothorax. Other: None IMPRESSION: 1. Chronic small bifrontal infarcts with encephalomalacia. 2. Chronic small vessel ischemic disease. No acute intracranial abnormality or skull fracture. 3. No acute cervical spine fracture or posttraumatic listhesis. Degenerative disc disease at C5-6 with broad-based central disc bulge and mild bilateral foraminal encroachment from uncinate spurs. Electronically Signed   By: Ashley Royalty M.D.   On: 04/27/2017 02:17   Dg Chest Portable 1 View  Result Date: 04/27/2017 CLINICAL DATA:  Productive cough and weakness EXAM: PORTABLE CHEST 1 VIEW COMPARISON:  06/18/2013 FINDINGS: Shallow inspiration. Normal heart size and pulmonary vascularity. No focal airspace disease or consolidation in the lungs. No blunting of costophrenic angles. No pneumothorax. Mediastinal contours appear intact. IMPRESSION: No active disease. Electronically Signed   By: Lucienne Capers M.D.   On: 04/27/2017 02:01     CBC Recent Labs  Lab 04/27/17 0132  WBC 7.0  HGB 13.9  HCT 45.1  PLT 333  MCV 104.4*  MCH 32.1  MCHC 30.7*  RDW 14.9*  LYMPHSABS 0.8*  MONOABS 0.4  EOSABS 0.0  BASOSABS 0.0    Chemistries  Recent Labs  Lab 04/26/17 0148 04/27/17 0206 04/27/17 0529 04/27/17 0919 04/27/17 1323  NA  --  127* 132* 138 145  K  --  6.6* 6.6* 4.6 4.0  CL  --  85* 93* 99* 107  CO2  --  17* 18* 22 27  GLUCOSE  --  1,367* 1,180* 946* 532*  BUN  --  84* 78* 73* 64*  CREATININE  --  2.05*  1.84* 1.92* 1.36*  CALCIUM  --  8.4* 8.6* 8.6* 9.1  MG 3.0*  --   --   --   --   AST  --  79* 59*  --   --   ALT  --  32 30  --   --   ALKPHOS  --  111 113  --   --   BILITOT  --  1.2 1.4*  --   --    ------------------------------------------------------------------------------------------------------------------  estimated creatinine clearance is 43 mL/min (A) (by C-G formula based on SCr of 1.36 mg/dL (H)). ------------------------------------------------------------------------------------------------------------------ No results for input(s): HGBA1C in the last 72 hours. ------------------------------------------------------------------------------------------------------------------ No results for input(s): CHOL, HDL, LDLCALC, TRIG, CHOLHDL, LDLDIRECT in the last 72 hours. ------------------------------------------------------------------------------------------------------------------ No results for input(s): TSH, T4TOTAL, T3FREE, THYROIDAB in the last 72 hours.  Invalid input(s): FREET3 ------------------------------------------------------------------------------------------------------------------ No results for input(s): VITAMINB12, FOLATE, FERRITIN, TIBC, IRON, RETICCTPCT in the last 72 hours.  Coagulation profile No results for input(s): INR, PROTIME in the last 168 hours.  No results for input(s): DDIMER in the last 72 hours.  Cardiac Enzymes No results for input(s): CKMB, TROPONINI, MYOGLOBIN in the last 168 hours.  Invalid input(s): CK ------------------------------------------------------------------------------------------------------------------ Invalid input(s): [place]   This is a 62 year old male admitted for DKA. 1.  DKA: In setting of severe alcohol intake Continue insulin drip aggressive IV fluids Start on longer acting insulin once DKA resolved 2.  Heart failure: Currently compensated continue to monitor no evidence of CHF 3.   Alcohol abuse: CIWA scale  4.  Acute kidney injury due to dehydration continue IV fluid  5.  GI prophylaxis: None       Code Status Orders  (From admission, onward)        Start     Ordered   04/27/17 0532  Full code  Continuous     04/27/17 0531    Code Status History    This patient has a current code status but no historical code status.    Advance Directive Documentation     Most Recent Value  Type of Advance Directive  Healthcare Power of Attorney, Living will  Pre-existing out of facility DNR order (yellow form or pink MOST form)  -  "MOST" Form in Place?  -           Consults  intisivist DVT Prophylaxis heparin  Lab Results  Component Value Date   PLT 333 04/27/2017     Time Spent in minutes 45 minutes  Greater than 50% of time spent in care coordination and counseling patient regarding the condition and plan of care.   Dustin Flock M.D on 04/27/2017 at 2:52 PM  Between 7am to 6pm - Pager - [phone]  After 6pm go to [email or web address] - Proofreader  [employer]   Office  [phone]

## 2017-04-27 NOTE — H&P (Signed)
Shawn [name] is an 62 y.o. male.   Chief Complaint: Hyperglycemia HPI: The patient with past medical history of diabetes and heart failure presents to the emergency department via EMS due to his wife's concern for high blood sugar.  For the last 4 days the patient has not been eating.  He wants to drink only orange juice and milk.  He has not had any nausea or vomiting.  The patient's wife states that they had been drinking liquor over the weekend.  Prior to that the patient had gone to his primary care doctor who had increased his insulin.  The wife reports that he rotates his insulin injection sites and that has been using it as directed.  She called EMS because the patient could not hold onto objects and appeared to be slowing and mildly confused.  Laboratory evaluation revealed glucose greater than 1300 as well as increased anion gap consistent with diabetic ketoacidosis.  The patient was started on insulin drip prior to the hospitalist service being called for admission.  Past Medical History:  Diagnosis Date  . CHF (congestive heart failure) ([place])   . Diabetes mellitus without complication ([place])     History reviewed. No pertinent surgical history.  No family history on file. Social History:  reports that he has been smoking.  He has never used smokeless tobacco. He reports that he drank alcohol. His drug history is not on file.  Allergies: No Known Allergies  Medications Prior to Admission  Medication Sig Dispense Refill  . insulin aspart (NOVOLOG) 100 UNIT/ML injection Inject 5 Units into the skin 3 (three) times daily with meals.    . insulin glargine (LANTUS) 100 UNIT/ML injection Inject 15 Units into the skin at bedtime.      Results for orders placed or performed during the hospital encounter of 04/27/17 (from the past 48 hour(s))  Beta-hydroxybutyric acid     Status: Abnormal   Collection Time: 04/26/17  1:48 AM  Result Value Ref Range   Beta-Hydroxybutyric Acid >8.00  (H) 0.05 - 0.27 mmol/L    Comment: RESULT CONFIRMED BY MANUAL DILUTION. JAG Performed at [facility], [address] [place], [place] [address]   Ethanol     Status: None   Collection Time: 04/26/17  1:48 AM  Result Value Ref Range   Alcohol, Ethyl (B) <10 <10 mg/dL    Comment:        LOWEST DETECTABLE LIMIT FOR SERUM ALCOHOL IS 10 mg/dL FOR MEDICAL PURPOSES ONLY Performed at [facility], [address] [place], [place] [address]   Magnesium     Status: Abnormal   Collection Time: 04/26/17  1:48 AM  Result Value Ref Range   Magnesium 3.0 (H) 1.7 - 2.4 mg/dL    Comment: Performed at [facility], [address] [place], [place] [address]  Glucose, capillary     Status: Abnormal   Collection Time: 04/27/17  1:22 AM  Result Value Ref Range   Glucose-Capillary >600 (HH) 65 - 99 mg/dL  CBC with Differential     Status: Abnormal   Collection Time: 04/27/17  1:32 AM  Result Value Ref Range   WBC 7.0 3.8 - 10.6 K/uL   RBC 4.32 (L) 4.40 - 5.90 MIL/uL   Hemoglobin 13.9 13.0 - 18.0 g/dL    Comment: RESULT REPEATED AND VERIFIED   HCT 45.1 40.0 - 52.0 %   MCV 104.4 (H) 80.0 - 100.0 fL   MCH 32.1 26.0 - 34.0 pg  MCHC 30.7 (L) 32.0 - 36.0 g/dL   RDW 14.9 (H) 11.5 - 14.5 %   Platelets 333 150 - 440 K/uL   Neutrophils Relative % 82 %   Neutro Abs 5.7 1.4 - 6.5 K/uL   Lymphocytes Relative 12 %   Lymphs Abs 0.8 (L) 1.0 - 3.6 K/uL   Monocytes Relative 6 %   Monocytes Absolute 0.4 0.2 - 1.0 K/uL   Eosinophils Relative 0 %   Eosinophils Absolute 0.0 0 - 0.7 K/uL   Basophils Relative 0 %   Basophils Absolute 0.0 0 - 0.1 K/uL    Comment: Performed at [facility], [place], [place] [address]  Lactic acid, plasma     Status: Abnormal   Collection Time: 04/27/17  1:48 AM  Result Value Ref Range   Lactic Acid, Venous 2.2 (HH) 0.5 - 1.9 mmol/L    Comment: CRITICAL RESULT CALLED TO, READ BACK BY AND VERIFIED WITH KASEY ROBERTS AT  0500 ON 04/27/17 [place]. Performed at [facility], [address] [place], [place] [address]   Blood gas, venous     Status: Abnormal   Collection Time: 04/27/17  1:48 AM  Result Value Ref Range   pH, Ven 7.18 (LL) 7.250 - 7.430    Comment: CRITICAL RESULT CALLED TO, READ BACK BY AND VERIFIED WITH: DR.FORBACH @ 0240  04/27/2017  JCG    pCO2, Ven 45 44.0 - 60.0 mmHg   pO2, Ven 51.0 (H) 32.0 - 45.0 mmHg   Bicarbonate 16.8 (L) 20.0 - 28.0 mmol/L   Acid-base deficit 11.3 (H) 0.0 - 2.0 mmol/L   O2 Saturation 74.9 %   Patient temperature 37.0    Collection site VENOUS    Sample type VENOUS     Comment: Performed at [facility], [address] [place], [place] [address]  Urinalysis, Routine w reflex microscopic     Status: Abnormal   Collection Time: 04/27/17  1:48 AM  Result Value Ref Range   Color, Urine STRAW (A) YELLOW   APPearance CLEAR (A) CLEAR   Specific Gravity, Urine 1.021 1.005 - 1.030   pH 5.0 5.0 - 8.0   Glucose, UA >=500 (A) NEGATIVE mg/dL   Hgb urine dipstick NEGATIVE NEGATIVE   Bilirubin Urine NEGATIVE NEGATIVE   Ketones, ur 20 (A) NEGATIVE mg/dL   Protein, ur NEGATIVE NEGATIVE mg/dL   Nitrite NEGATIVE NEGATIVE   Leukocytes, UA NEGATIVE NEGATIVE   WBC, UA NONE SEEN 0 - 5 WBC/hpf   Bacteria, UA NONE SEEN NONE SEEN   Squamous Epithelial / LPF NONE SEEN 0 - 5    Comment: Please note change in reference range.   Mucus PRESENT    Hyaline Casts, UA PRESENT     Comment: Performed at [facility], [address] [place], [place] [address]  Urine Drug Screen, Qualitative [facility] only)     Status: None   Collection Time: 04/27/17  1:48 AM  Result Value Ref Range   Tricyclic, Ur Screen NONE DETECTED NONE DETECTED   Amphetamines, Ur Screen NONE DETECTED NONE DETECTED   MDMA (Ecstasy)Ur Screen NONE DETECTED NONE DETECTED   Cocaine Metabolite,Ur [place] NONE DETECTED NONE DETECTED   Opiate, Ur Screen NONE DETECTED NONE DETECTED   Phencyclidine (PCP)  Ur S NONE DETECTED NONE DETECTED   Cannabinoid 50 Ng, Ur [place] NONE DETECTED NONE DETECTED   Barbiturates, Ur Screen NONE DETECTED NONE DETECTED   Benzodiazepine, Ur Scrn NONE DETECTED NONE DETECTED   Methadone  Scn, Ur NONE DETECTED NONE DETECTED    Comment: (NOTE) Tricyclics + metabolites, urine    Cutoff 1000 ng/mL Amphetamines + metabolites, urine  Cutoff 1000 ng/mL MDMA (Ecstasy), urine              Cutoff 500 ng/mL Cocaine Metabolite, urine          Cutoff 300 ng/mL Opiate + metabolites, urine        Cutoff 300 ng/mL Phencyclidine (PCP), urine         Cutoff 25 ng/mL Cannabinoid, urine                 Cutoff 50 ng/mL Barbiturates + metabolites, urine  Cutoff 200 ng/mL Benzodiazepine, urine              Cutoff 200 ng/mL Methadone, urine                   Cutoff 300 ng/mL The urine drug screen provides only a preliminary, unconfirmed analytical test result and should not be used for non-medical purposes. Clinical consideration and professional judgment should be applied to any positive drug screen result due to possible interfering substances. A more specific alternate chemical method must be used in order to obtain a confirmed analytical result. Gas chromatography / mass spectrometry (GC/MS) is the preferred confirmat ory method. Performed at [facility], [address] [place], [place] [address]   Comprehensive metabolic panel     Status: Abnormal   Collection Time: 04/27/17  2:06 AM  Result Value Ref Range   Sodium 127 (L) 135 - 145 mmol/L    Comment:  LYTES REPEATED. JAG   Potassium 6.6 (HH) 3.5 - 5.1 mmol/L    Comment: CRITICAL RESULT CALLED TO, READ BACK BY AND VERIFIED WITH KASEY ROBERTS ON 04/27/17 AT 0405 JAG    Chloride 85 (L) 101 - 111 mmol/L   CO2 17 (L) 22 - 32 mmol/L   Glucose, Bld 1,367 (HH) 65 - 99 mg/dL    Comment: CRITICAL RESULT CALLED TO, READ BACK BY AND VERIFIED WITH DR [facility] ON 04/27/17 AT 0425 JAG RESULTS CONFIRMED BY MANUAL  DILUTION CORRECTED ON 04/24 AT 0525: PREVIOUSLY REPORTED AS 1367 CRITICAL RESULT CALLED TO, READ BACK BY AND VERIFIED WITH DR [facility] ON 04/27/17 AT 0425 JAG    BUN 84 (H) 6 - 20 mg/dL   Creatinine, Ser 2.05 (H) 0.61 - 1.24 mg/dL   Calcium 8.4 (L) 8.9 - 10.3 mg/dL   Total Protein 5.4 (L) 6.5 - 8.1 g/dL   Albumin 3.2 (L) 3.5 - 5.0 g/dL   AST 79 (H) 15 - 41 U/L   ALT 32 17 - 63 U/L   Alkaline Phosphatase 111 38 - 126 U/L   Total Bilirubin 1.2 0.3 - 1.2 mg/dL   GFR calc non Af Amer 34 (L) >60 mL/min   GFR calc Af Amer 39 (L) >60 mL/min    Comment: (NOTE) The eGFR has been calculated using the CKD EPI equation. This calculation has not been validated in all clinical situations. eGFR's persistently <60 mL/min signify possible Chronic Kidney Disease.    Anion gap 25 (H) 5 - 15    Comment: Performed at [facility], [address] [place], [place] [address]  Glucose, capillary     Status: Abnormal   Collection Time: 04/27/17  2:20 AM  Result Value Ref Range   Glucose-Capillary >600 (HH) 65 - 99 mg/dL  Glucose, capillary     Status: Abnormal  Collection Time: 04/27/17  3:23 AM  Result Value Ref Range   Glucose-Capillary >600 (HH) 65 - 99 mg/dL  Glucose, capillary     Status: Abnormal   Collection Time: 04/27/17  5:17 AM  Result Value Ref Range   Glucose-Capillary >600 (HH) 65 - 99 mg/dL  Lactic acid, plasma     Status: Abnormal   Collection Time: 04/27/17  5:29 AM  Result Value Ref Range   Lactic Acid, Venous 2.0 (HH) 0.5 - 1.9 mmol/L    Comment: CRITICAL RESULT CALLED TO, READ BACK BY AND VERIFIED WITH  ANGELA BREHUN AT 3428 04/27/17 SDR Performed at [facility], [address] [place], [place] [address]   Comprehensive metabolic panel     Status: Abnormal   Collection Time: 04/27/17  5:29 AM  Result Value Ref Range   Sodium 132 (L) 135 - 145 mmol/L    Comment: ELECTROLYTES REPEATED SDR   Potassium 6.6 (HH) 3.5 - 5.1 mmol/L    Comment: CRITICAL RESULT  CALLED TO, READ BACK BY AND VERIFIED WITH  SABRINA ELLIOTT AT 0720 04/27/17 SDR    Chloride 93 (L) 101 - 111 mmol/L   CO2 18 (L) 22 - 32 mmol/L   Glucose, Bld 1,180 (HH) 65 - 99 mg/dL    Comment: RESULT CONFIRMED BY MANUAL DILUTION SDR CRITICAL RESULT CALLED TO, READ BACK BY AND VERIFIED WITH  SABRINA ELLIOTT AT 0720 04/27/17 SDR    BUN 78 (H) 6 - 20 mg/dL   Creatinine, Ser 1.84 (H) 0.61 - 1.24 mg/dL   Calcium 8.6 (L) 8.9 - 10.3 mg/dL   Total Protein 5.4 (L) 6.5 - 8.1 g/dL   Albumin 3.3 (L) 3.5 - 5.0 g/dL   AST 59 (H) 15 - 41 U/L   ALT 30 17 - 63 U/L   Alkaline Phosphatase 113 38 - 126 U/L   Total Bilirubin 1.4 (H) 0.3 - 1.2 mg/dL   GFR calc non Af Amer 38 (L) >60 mL/min   GFR calc Af Amer 44 (L) >60 mL/min    Comment: (NOTE) The eGFR has been calculated using the CKD EPI equation. This calculation has not been validated in all clinical situations. eGFR's persistently <60 mL/min signify possible Chronic Kidney Disease.    Anion gap 21 (H) 5 - 15    Comment: Performed at [facility], [address] [place], [place] [address]  MRSA PCR Screening     Status: None   Collection Time: 04/27/17  5:32 AM  Result Value Ref Range   MRSA by PCR NEGATIVE NEGATIVE    Comment:        The GeneXpert MRSA Assay (FDA approved for NASAL specimens only), is one component of a comprehensive MRSA colonization surveillance program. It is not intended to diagnose MRSA infection nor to guide or monitor treatment for MRSA infections. Performed at [facility], [address] [place],  [address]   Glucose, capillary     Status: Abnormal   Collection Time: 04/27/17  6:27 AM  Result Value Ref Range   Glucose-Capillary >600 (HH) 65 - 99 mg/dL   Comment 1 Notify RN   Glucose, capillary     Status: Abnormal   Collection Time: 04/27/17  7:35 AM  Result Value Ref Range   Glucose-Capillary >600 (HH) 65 - 99 mg/dL   Comment 1 Notify RN    Ct Head Wo Contrast  Result  Date: 04/27/2017 CLINICAL DATA:  Difficulty walking and tremors over the past few days.  Patient has not been eating. Multiple falls including 3 last evening. EXAM: CT HEAD WITHOUT CONTRAST CT CERVICAL SPINE WITHOUT CONTRAST TECHNIQUE: Multidetector CT imaging of the head and cervical spine was performed following the standard protocol without intravenous contrast. Multiplanar CT image reconstructions of the cervical spine were also generated. COMPARISON:  Report from 08/25/2003 FINDINGS: CT HEAD FINDINGS Brain: Chronic small bifrontal infarcts with encephalomalacia. Age related involutional changes of the brain. No hydrocephalus. No acute large vascular territory infarct, hemorrhage or midline shift. Chronic small vessel ischemic disease of periventricular white matter. No effacement of the basal cisterns or fourth ventricle. Unremarkable cerebellum and brainstem. Vascular: No hyperdense vessel or unexpected calcifications Skull: No acute skull fracture.  No suspicious osseous lesions. Sinuses/Orbits: Clear bilateral mastoids. Intact orbits and globes. No acute sinus disease. Other: None CT CERVICAL SPINE FINDINGS Alignment: Straightening of cervical lordosis which may be due to muscle spasm or patient positioning. Intact craniocervical relationship and atlantodental interval. Skull base and vertebrae: Intact skull base. No cervical spine fracture or listhesis. Soft tissues and spinal canal: No prevertebral fluid or swelling. No visible canal hematoma. Disc levels: Marked disc space narrowing at C4-5 with broad-based central disc bulge. No significant central canal stenosis. There is mild bilateral foraminal stenosis secondary to uncinate spurring. Upper chest: Emphysematous change at the apices with subpleural blebs. Irregular linear opacities at the lung apices more commonly associated with scarring and/or atelectasis. No apparent dominant mass. No effusion or pneumothorax. Other: None IMPRESSION: 1. Chronic small  bifrontal infarcts with encephalomalacia. 2. Chronic small vessel ischemic disease. No acute intracranial abnormality or skull fracture. 3. No acute cervical spine fracture or posttraumatic listhesis. Degenerative disc disease at C5-6 with broad-based central disc bulge and mild bilateral foraminal encroachment from uncinate spurs. Electronically Signed   By: Ashley Royalty M.D.   On: 04/27/2017 02:17   Ct Cervical Spine Wo Contrast  Result Date: 04/27/2017 CLINICAL DATA:  Difficulty walking and tremors over the past few days. Patient has not been eating. Multiple falls including 3 last evening. EXAM: CT HEAD WITHOUT CONTRAST CT CERVICAL SPINE WITHOUT CONTRAST TECHNIQUE: Multidetector CT imaging of the head and cervical spine was performed following the standard protocol without intravenous contrast. Multiplanar CT image reconstructions of the cervical spine were also generated. COMPARISON:  Report from 08/25/2003 FINDINGS: CT HEAD FINDINGS Brain: Chronic small bifrontal infarcts with encephalomalacia. Age related involutional changes of the brain. No hydrocephalus. No acute large vascular territory infarct, hemorrhage or midline shift. Chronic small vessel ischemic disease of periventricular white matter. No effacement of the basal cisterns or fourth ventricle. Unremarkable cerebellum and brainstem. Vascular: No hyperdense vessel or unexpected calcifications Skull: No acute skull fracture.  No suspicious osseous lesions. Sinuses/Orbits: Clear bilateral mastoids. Intact orbits and globes. No acute sinus disease. Other: None CT CERVICAL SPINE FINDINGS Alignment: Straightening of cervical lordosis which may be due to muscle spasm or patient positioning. Intact craniocervical relationship and atlantodental interval. Skull base and vertebrae: Intact skull base. No cervical spine fracture or listhesis. Soft tissues and spinal canal: No prevertebral fluid or swelling. No visible canal hematoma. Disc levels: Marked disc  space narrowing at C4-5 with broad-based central disc bulge. No significant central canal stenosis. There is mild bilateral foraminal stenosis secondary to uncinate spurring. Upper chest: Emphysematous change at the apices with subpleural blebs. Irregular linear opacities at the lung apices more commonly associated with scarring and/or atelectasis. No apparent dominant mass. No effusion or pneumothorax. Other: None IMPRESSION: 1. Chronic small bifrontal infarcts with  encephalomalacia. 2. Chronic small vessel ischemic disease. No acute intracranial abnormality or skull fracture. 3. No acute cervical spine fracture or posttraumatic listhesis. Degenerative disc disease at C5-6 with broad-based central disc bulge and mild bilateral foraminal encroachment from uncinate spurs. Electronically Signed   By: Ashley Royalty M.D.   On: 04/27/2017 02:17   Dg Chest Portable 1 View  Result Date: 04/27/2017 CLINICAL DATA:  Productive cough and weakness EXAM: PORTABLE CHEST 1 VIEW COMPARISON:  06/18/2013 FINDINGS: Shallow inspiration. Normal heart size and pulmonary vascularity. No focal airspace disease or consolidation in the lungs. No blunting of costophrenic angles. No pneumothorax. Mediastinal contours appear intact. IMPRESSION: No active disease. Electronically Signed   By: Lucienne Capers M.D.   On: 04/27/2017 02:01    Review of Systems  Unable to perform ROS: Medical condition    Blood pressure (!) 87/56, pulse 68, temperature 98.3 F (36.8 C), temperature source Oral, resp. rate 19, height 5' 11" (1.803 m), weight 52.6 kg (116 lb), SpO2 94 %. Physical Exam  Vitals reviewed. Constitutional: He is oriented to person, place, and time. He appears well-developed and well-nourished. No distress.  HENT:  Head: Normocephalic and atraumatic.  Mouth/Throat: Oropharynx is clear and moist.  Eyes: Pupils are equal, round, and reactive to light. Conjunctivae and EOM are normal. No scleral icterus.  Neck: Normal range of  motion. Neck supple. No tracheal deviation present. No thyromegaly present.  Cardiovascular: Normal rate and regular rhythm. Exam reveals no gallop and no friction rub.  No murmur heard. Respiratory: Effort normal and breath sounds normal. No respiratory distress. He has no wheezes.  GI: Soft. Bowel sounds are normal. He exhibits no distension. There is no tenderness.  Genitourinary:  Genitourinary Comments: Deferred  Musculoskeletal: Normal range of motion. He exhibits no edema.  Neurological: He is alert and oriented to person, place, and time. No cranial nerve deficit.  Skin: Skin is warm and dry. No rash noted. No erythema.  Psychiatric:  Patient does not contribute to his own history due to fatigue and/or disinterest.     Assessment/Plan This is a 62 year old male admitted for DKA. 1.  DKA: Continue insulin drip until anion gap closes.  Start basal insulin therapy at that time and allow the patient to eat.  Supplement dextrose once blood sugar is less than 250.  Likely precipitated by alcohol intake. 2.  Heart failure: The patient does not appear to be in exacerbation at this time.  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  The patient receives his care at the [facility] but he is unable to undergo transport at this time due to his critical illness as well as lack of open beds at the nearest [place] facility. 3.  Alcohol abuse: CIWA scale due to abstinence 4.  DVT prophylaxis: Heparin 5.  GI prophylaxis: None The patient is a full code.  Time spent on admission orders and critical care approximately 45 minutes.  Discussed with [facility] telemedicine  Harrie Foreman, MD 04/27/2017, 8:11 AM

## 2017-04-27 NOTE — ED Notes (Addendum)
Lab called about CMP, states 5 more minutes for results - EDP aware

## 2017-04-28 LAB — MAGNESIUM: MAGNESIUM: 2.5 mg/dL — AB (ref 1.7–2.4)

## 2017-04-28 LAB — BASIC METABOLIC PANEL
ANION GAP: 5 (ref 5–15)
BUN: 38 mg/dL — ABNORMAL HIGH (ref 6–20)
CHLORIDE: 110 mmol/L (ref 101–111)
CO2: 31 mmol/L (ref 22–32)
CREATININE: 0.88 mg/dL (ref 0.61–1.24)
Calcium: 9 mg/dL (ref 8.9–10.3)
GFR calc non Af Amer: >60 mL/min (ref >60)
Glucose, Bld: 151 mg/dL — ABNORMAL HIGH (ref 65–99)
POTASSIUM: 4 mmol/L (ref 3.5–5.1)
Sodium: 146 mmol/L — ABNORMAL HIGH (ref 135–145)

## 2017-04-28 LAB — URINE CULTURE
Culture: NO GROWTH
Special Requests: NORMAL

## 2017-04-28 LAB — GLUCOSE, CAPILLARY
Glucose-Capillary: 112 mg/dL — ABNORMAL HIGH (ref 65–99)
Glucose-Capillary: 197 mg/dL — ABNORMAL HIGH (ref 65–99)
Glucose-Capillary: 203 mg/dL — ABNORMAL HIGH (ref 65–99)
Glucose-Capillary: 224 mg/dL — ABNORMAL HIGH (ref 65–99)
Glucose-Capillary: 199 mg/dL — ABNORMAL HIGH (ref 65–99)

## 2017-04-28 LAB — HEMOGLOBIN A1C
HEMOGLOBIN A1C: 13.5 % — AB (ref 4.8–5.6)
Hgb A1c MFr Bld: 15.1 % — ABNORMAL HIGH (ref 4.8–5.6)
MEAN PLASMA GLUCOSE: 340.75 mg/dL
Mean Plasma Glucose: 387 mg/dL

## 2017-04-28 MED ORDER — VITAMIN C 500 MG PO TABS
250.0000 mg | ORAL_TABLET | Freq: Two times a day (BID) | ORAL | Status: DC
  Administered 2017-04-28 – 2017-04-29 (×2): 250 mg via ORAL
  Filled 2017-04-28 (×3): qty 0.5

## 2017-04-28 MED ORDER — LORAZEPAM 1 MG PO TABS: 1.0000 mg | ORAL_TABLET | Freq: Four times a day (QID) | ORAL | Status: DC | PRN

## 2017-04-28 MED ORDER — VITAMIN B-1 100 MG PO TABS
100.0000 mg | ORAL_TABLET | Freq: Every day | ORAL | Status: DC
  Administered 2017-04-28 – 2017-04-29 (×2): 100 mg via ORAL
  Filled 2017-04-28 (×2): qty 1

## 2017-04-28 MED ORDER — PREMIER PROTEIN SHAKE
11.0000 [oz_av] | Freq: Two times a day (BID) | ORAL | Status: DC
  Administered 2017-04-29: 11 [oz_av] via ORAL

## 2017-04-28 MED ORDER — CARVEDILOL 3.125 MG PO TABS
3.1250 mg | ORAL_TABLET | Freq: Two times a day (BID) | ORAL | Status: DC
  Administered 2017-04-28 – 2017-04-29 (×2): 3.125 mg via ORAL
  Filled 2017-04-28 (×2): qty 1

## 2017-04-28 MED ORDER — SODIUM CHLORIDE 0.45 % IV SOLN
INTRAVENOUS | Status: DC
  Administered 2017-04-28 – 2017-04-29 (×2): via INTRAVENOUS

## 2017-04-28 MED ORDER — FOLIC ACID 1 MG PO TABS
1.0000 mg | ORAL_TABLET | Freq: Every day | ORAL | Status: DC
  Administered 2017-04-28 – 2017-04-29 (×2): 1 mg via ORAL
  Filled 2017-04-28 (×2): qty 1

## 2017-04-28 MED ORDER — THIAMINE HCL 100 MG/ML IJ SOLN
100.0000 mg | Freq: Every day | INTRAMUSCULAR | Status: DC
  Administered 2017-04-29: 100 mg via INTRAVENOUS
  Filled 2017-04-28 (×2): qty 1

## 2017-04-28 MED ORDER — ADULT MULTIVITAMIN W/MINERALS CH
1.0000 | ORAL_TABLET | Freq: Every day | ORAL | Status: DC
  Administered 2017-04-28 – 2017-04-29 (×2): 1 via ORAL
  Filled 2017-04-28 (×2): qty 1

## 2017-04-28 MED ORDER — ASPIRIN 81 MG PO CHEW
81.0000 mg | CHEWABLE_TABLET | Freq: Every day | ORAL | Status: DC
  Administered 2017-04-28 – 2017-04-29 (×2): 81 mg via ORAL
  Filled 2017-04-28 (×2): qty 1

## 2017-04-28 MED ORDER — LISINOPRIL 5 MG PO TABS: 5.0000 mg | ORAL_TABLET | Freq: Every day | ORAL | Status: DC

## 2017-04-28 MED ORDER — LORAZEPAM 2 MG/ML IJ SOLN: 1.0000 mg | Freq: Four times a day (QID) | INTRAMUSCULAR | Status: DC | PRN

## 2017-04-28 NOTE — Plan of Care (Signed)

## 2017-04-28 NOTE — Progress Notes (Signed)
Inpatient Diabetes Program Recommendations  AACE/ADA: New Consensus Statement on Inpatient Glycemic Control (2015)  Target Ranges:  Prepandial:   less than 140 mg/dL      Peak postprandial:   less than 180 mg/dL (1-2 hours)      Critically ill patients:  140 - 180 mg/dL   Lab Results  Component Value Date   GLUCAP 197 (H) 04/28/2017   HGBA1C 13.5 (H) 04/28/2017    Review of Glycemic Control Results for CHRISTINO, MCGLINCHEY (MRN [id number]) as of 04/28/2017 14:34  Ref. Range 04/27/2017 21:53 04/28/2017 07:56 04/28/2017 11:45  Glucose-Capillary Latest Ref Range: 65 - 99 mg/dL 188 (H) 112 (H) 197 (H)    Diabetes history: Type 2 DM  Outpatient Diabetes medications: Lantus 15 units daily, Novolog 5 units tid with meals Current orders for Inpatient glycemic control: Lantus 15 units q HS, Novolog sensitive tid with meals and HS  Inpatient Diabetes Program Recommendations:  Patient much more alert today.  Spoke with patient and sisters at bedside regarding DM and A1C.  A1C is 13.5% indicating average blood sugars approximately 346 mg/dL.  Patient states that usually when he checks his blood sugar it is in the 100's however sometimes in the 300's.  He states that he does take his insulin however with further questioning he admits that sometimes he forgets.  Recommended that he write down doses when he takes them to help him remember when he does take his insulin. Of note he is using vial and syringe for insulin dosing.  May have better compliance with insulin pens?  Patient states that he wishes he could use insulin pens.  We discussed his diet and he admits that he eats but only small amounts.  I reminded patient that when blood sugars are high then his cells are not getting what they need which leads to weight loss.  Sisters asked about potential need for supplements.  Will place nutrition consult. He will need close follow up with his PCP and help at home with administration and reminders for insulin  doses.  Will follow.

## 2017-04-28 NOTE — Progress Notes (Addendum)
[place] at [facility]                                                                                                                                                                                  Patient Demographics   Shawn [name], is a 62 y.o. male, DOB - Aug 28, 1955, ZYS:[id number]  Admit date - 04/27/2017   Admitting Physician Harrie Foreman, MD  Outpatient Primary MD for the patient is System, Pcp Not In   LOS - 1  Subjective: Patient admitted with DKA Off insulin drip.  The patient feels weak and drowsy.   Review of Systems:   CONSTITUTIONAL: No documented fever.  Generalized weakness.   EYES: No blurry or double vision.  ENT: No tinnitus. No postnasal drip. RESPIRATORY: No cough, no wheeze, no hemoptysis. No dyspnea.  CARDIOVASCULAR: No chest pain. No orthopnea. No palpitations. No syncope.  GASTROINTESTINAL: No nausea, no vomiting or diarrhea. No abdominal pain. No melena or hematochezia.  GENITOURINARY: No dysuria or hematuria.  ENDOCRINE: No polyuria or nocturia. No heat or cold intolerance.  HEMATOLOGY: No anemia. No bruising. No bleeding.  INTEGUMENTARY: No rashes. No lesions.  MUSCULOSKELETAL: No arthritis. No swelling. No gout.  NEUROLOGIC: No numbness, tingling, or ataxia. No seizure-type activity.  PSYCHIATRIC: No anxiety. No insomnia. No ADD.    Vitals:   Vitals:   04/28/17 0300 04/28/17 0410 04/28/17 1321 04/28/17 1514  BP: 122/72 138/89 (!) 145/93   Pulse: 72 63 67 65  Resp: (!) 21 16 20    Temp:  93.2 F (37.1 C) (!) 97.5 F (36.4 C)   TempSrc:  Oral Oral   SpO2: 96% 99% 98% 95%  Weight:  123 lb 14.4 oz (56.2 kg)    Height:  5\' 11"  (1.803 m)      Wt Readings from Last 3 Encounters:  04/28/17 123 lb 14.4 oz (56.2 kg)  01/06/17 107 lb (48.5 kg)     Intake/Output Summary (Last 24 hours) at 04/28/2017 1516 Last data filed at 04/28/2017 1019 Gross per 24 hour  Intake 192.83 ml  Output 1300 ml  Net -1107.17  ml    Physical Exam:   GENERAL: Pleasant-appearing in no apparent distress.  Drowsy.  Severe malnutrition. HEAD, EYES, EARS, NOSE AND THROAT: Atraumatic, normocephalic. Extraocular muscles are intact. Pupils equal and reactive to light. Sclerae anicteric. No conjunctival injection. No oro-pharyngeal erythema.  NECK: Supple. There is no jugular venous distention. No bruits, no lymphadenopathy, no thyromegaly.  HEART: Regular rate and rhythm,. No murmurs, no rubs, no clicks.  LUNGS: Clear to auscultation bilaterally. No rales or rhonchi. No wheezes.  ABDOMEN: Soft, flat, nontender, nondistended. Has good bowel sounds.  No hepatosplenomegaly appreciated.  EXTREMITIES: No evidence of any cyanosis, clubbing, or peripheral edema.  +2 pedal and radial pulses bilaterally.  NEUROLOGIC: The patient is alert, awake, and oriented x3 with no focal motor or sensory deficits appreciated bilaterally.  SKIN: Moist and warm with no rashes appreciated.  Psych: Not anxious, depressed LN: No inguinal LN enlargement    Antibiotics   Anti-infectives (From admission, onward)   None      Medications   Scheduled Meds: . heparin  5,000 Units Subcutaneous Q8H  . insulin aspart  0-5 Units Subcutaneous QHS  . insulin aspart  0-9 Units Subcutaneous TID WC  . insulin glargine  15 Units Subcutaneous QHS   Continuous Infusions: . sodium chloride     PRN Meds:.   Data Review:   Micro Results Recent Results (from the past 240 hour(s))  Urine Culture     Status: None   Collection Time: 04/27/17  1:48 AM  Result Value Ref Range Status   Specimen Description   Final    URINE, RANDOM Performed at [facility], [address], [place], [place] [address]    Special Requests   Final    Normal Performed at [facility], [address] [place], [place] [address]    Culture   Final    NO GROWTH Performed at [facility], [place] [address], [place], [place] [address]    Report  Status 04/28/2017 FINAL  Final  MRSA PCR Screening     Status: None   Collection Time: 04/27/17  5:32 AM  Result Value Ref Range Status   MRSA by PCR NEGATIVE NEGATIVE Final    Comment:        The GeneXpert MRSA Assay (FDA approved for NASAL specimens only), is one component of a comprehensive MRSA colonization surveillance program. It is not intended to diagnose MRSA infection nor to guide or monitor treatment for MRSA infections. Performed at [facility], [address] [place] [address] [place], [place] [address]     Radiology Reports Ct Head Wo Contrast  Result Date: 04/27/2017 CLINICAL DATA:  Difficulty walking and tremors over the past few days. Patient has not been eating. Multiple falls including 3 last evening. EXAM: CT HEAD WITHOUT CONTRAST CT CERVICAL SPINE WITHOUT CONTRAST TECHNIQUE: Multidetector CT imaging of the head and cervical spine was performed following the standard protocol without intravenous contrast. Multiplanar CT image reconstructions of the cervical spine were also generated. COMPARISON:  Report from 08/25/2003 FINDINGS: CT HEAD FINDINGS Brain: Chronic small bifrontal infarcts with encephalomalacia. Age related involutional changes of the brain. No hydrocephalus. No acute large vascular territory infarct, hemorrhage or midline shift. Chronic small vessel ischemic disease of periventricular white matter. No effacement of the basal cisterns or fourth ventricle. Unremarkable cerebellum and brainstem. Vascular: No hyperdense vessel or unexpected calcifications Skull: No acute skull fracture.  No suspicious osseous lesions. Sinuses/Orbits: Clear bilateral mastoids. Intact orbits and globes. No acute sinus disease. Other: None CT CERVICAL SPINE FINDINGS Alignment: Straightening of cervical lordosis which may be due to muscle spasm or patient positioning. Intact craniocervical relationship and atlantodental interval. Skull base and vertebrae: Intact skull base. No cervical spine  fracture or listhesis. Soft tissues and spinal canal: No prevertebral fluid or swelling. No visible canal hematoma. Disc levels: Marked disc space narrowing at C4-5 with broad-based central disc bulge. No significant central canal stenosis. There is mild bilateral foraminal stenosis secondary to uncinate spurring. Upper chest: Emphysematous change at the apices with subpleural blebs. Irregular  linear opacities at the lung apices more commonly associated with scarring and/or atelectasis. No apparent dominant mass. No effusion or pneumothorax. Other: None IMPRESSION: 1. Chronic small bifrontal infarcts with encephalomalacia. 2. Chronic small vessel ischemic disease. No acute intracranial abnormality or skull fracture. 3. No acute cervical spine fracture or posttraumatic listhesis. Degenerative disc disease at C5-6 with broad-based central disc bulge and mild bilateral foraminal encroachment from uncinate spurs. Electronically Signed   By: Ashley Royalty M.D.   On: 04/27/2017 02:17   Ct Cervical Spine Wo Contrast  Result Date: 04/27/2017 CLINICAL DATA:  Difficulty walking and tremors over the past few days. Patient has not been eating. Multiple falls including 3 last evening. EXAM: CT HEAD WITHOUT CONTRAST CT CERVICAL SPINE WITHOUT CONTRAST TECHNIQUE: Multidetector CT imaging of the head and cervical spine was performed following the standard protocol without intravenous contrast. Multiplanar CT image reconstructions of the cervical spine were also generated. COMPARISON:  Report from 08/25/2003 FINDINGS: CT HEAD FINDINGS Brain: Chronic small bifrontal infarcts with encephalomalacia. Age related involutional changes of the brain. No hydrocephalus. No acute large vascular territory infarct, hemorrhage or midline shift. Chronic small vessel ischemic disease of periventricular white matter. No effacement of the basal cisterns or fourth ventricle. Unremarkable cerebellum and brainstem. Vascular: No hyperdense vessel or  unexpected calcifications Skull: No acute skull fracture.  No suspicious osseous lesions. Sinuses/Orbits: Clear bilateral mastoids. Intact orbits and globes. No acute sinus disease. Other: None CT CERVICAL SPINE FINDINGS Alignment: Straightening of cervical lordosis which may be due to muscle spasm or patient positioning. Intact craniocervical relationship and atlantodental interval. Skull base and vertebrae: Intact skull base. No cervical spine fracture or listhesis. Soft tissues and spinal canal: No prevertebral fluid or swelling. No visible canal hematoma. Disc levels: Marked disc space narrowing at C4-5 with broad-based central disc bulge. No significant central canal stenosis. There is mild bilateral foraminal stenosis secondary to uncinate spurring. Upper chest: Emphysematous change at the apices with subpleural blebs. Irregular linear opacities at the lung apices more commonly associated with scarring and/or atelectasis. No apparent dominant mass. No effusion or pneumothorax. Other: None IMPRESSION: 1. Chronic small bifrontal infarcts with encephalomalacia. 2. Chronic small vessel ischemic disease. No acute intracranial abnormality or skull fracture. 3. No acute cervical spine fracture or posttraumatic listhesis. Degenerative disc disease at C5-6 with broad-based central disc bulge and mild bilateral foraminal encroachment from uncinate spurs. Electronically Signed   By: Ashley Royalty M.D.   On: 04/27/2017 02:17   Dg Chest Portable 1 View  Result Date: 04/27/2017 CLINICAL DATA:  Productive cough and weakness EXAM: PORTABLE CHEST 1 VIEW COMPARISON:  06/18/2013 FINDINGS: Shallow inspiration. Normal heart size and pulmonary vascularity. No focal airspace disease or consolidation in the lungs. No blunting of costophrenic angles. No pneumothorax. Mediastinal contours appear intact. IMPRESSION: No active disease. Electronically Signed   By: Lucienne Capers M.D.   On: 04/27/2017 02:01     CBC Recent Labs   Lab 04/27/17 0132  WBC 7.0  HGB 13.9  HCT 45.1  PLT 333  MCV 104.4*  MCH 32.1  MCHC 30.7*  RDW 14.9*  LYMPHSABS 0.8*  MONOABS 0.4  EOSABS 0.0  BASOSABS 0.0    Chemistries  Recent Labs  Lab 04/26/17 0148  04/27/17 0206 04/27/17 0529 04/27/17 0919 04/27/17 1323 04/27/17 1736 04/28/17 0525  NA  --    < > 127* 132* 138 145 148* 146*  K  --    < > 6.6* 6.6* 4.6 4.0 3.6 4.0  CL  --    < > 85* 93* 99* 107 112* 110  CO2  --    < > 17* 18* 22 27 32 31  GLUCOSE  --    < > 1,367* 1,180* 946* 532* 174* 151*  BUN  --    < > 84* 78* 73* 64* 52* 38*  CREATININE  --    < > 2.05* 1.84* 1.92* 1.36* 0.90 0.88  CALCIUM  --    < > 8.4* 8.6* 8.6* 9.1 9.2 9.0  MG 3.0*  --   --   --   --   --   --  2.5*  AST  --   --  79* 59*  --   --   --   --   ALT  --   --  32 30  --   --   --   --   ALKPHOS  --   --  111 113  --   --   --   --   BILITOT  --   --  1.2 1.4*  --   --   --   --    < > = values in this interval not displayed.   ------------------------------------------------------------------------------------------------------------------ estimated creatinine clearance is 71 mL/min (by C-G formula based on SCr of 0.88 mg/dL). ------------------------------------------------------------------------------------------------------------------ Recent Labs    04/28/17 0526  HGBA1C 13.5*   ------------------------------------------------------------------------------------------------------------------ No results for input(s): CHOL, HDL, LDLCALC, TRIG, CHOLHDL, LDLDIRECT in the last 72 hours. ------------------------------------------------------------------------------------------------------------------ No results for input(s): TSH, T4TOTAL, T3FREE, THYROIDAB in the last 72 hours.  Invalid input(s): FREET3 ------------------------------------------------------------------------------------------------------------------ No results for input(s): VITAMINB12, FOLATE, FERRITIN, TIBC, IRON,  RETICCTPCT in the last 72 hours.  Coagulation profile No results for input(s): INR, PROTIME in the last 168 hours.  No results for input(s): DDIMER in the last 72 hours.  Cardiac Enzymes No results for input(s): CKMB, TROPONINI, MYOGLOBIN in the last 168 hours.  Invalid input(s): CK ------------------------------------------------------------------------------------------------------------------ Invalid input(s): Dawn   This is a 62 year old male admitted for DKA. 1.  DKA: In setting of severe alcohol intake Improved obese insulin drip and aggressive IV fluids Started Lantus 50 units at at bedtime, NovoLog 3 units AC and sliding scale. Sugars controlled.  Hemoglobin A1c 13. 5.  Hypernatremia.  Change to half-normal saline and follow BMP.  2.    Chronic systolic heart failure, EF 20% in 2015.   stable.  Not on any medication, cardiology consult.  Start Coreg and defer cardiology consult for ACE inhibitor or Entresto.  3.  Alcohol abuse: CIWA scale  4.  Acute kidney injury due to dehydration, improving with IV fluid.  Severe malnutrition.  Dietitian consult. Generalized weakness.  PT evaluation suggested home health and PT.    Code Status Orders  (From admission, onward)        Start     Ordered   04/27/17 0532  Full code  Continuous     04/27/17 0531    Code Status History    This patient has a current code status but no historical code status.    Advance Directive Documentation     Most Recent Value  Type of Advance Directive  Healthcare Power of Attorney, Living will  Pre-existing out of facility DNR order (yellow form or pink MOST form)  -  "MOST" Form in Place?  -           Consults  intisivist DVT Prophylaxis heparin  Lab Results  Component Value Date   PLT 333 04/27/2017     Time Spent in minutes 36 minutes  Greater than 50% of time spent in care coordination and counseling patient regarding the condition and plan of  care. Discussed with his wife.  Demetrios Loll M.D on 04/28/2017 at 3:16 PM  Between 7am to 6pm - Pager - [phone]  After 6pm go to [email or web address] - Proofreader  [employer]   Office  [phone]

## 2017-04-28 NOTE — Progress Notes (Signed)
Initial Nutrition Assessment  DOCUMENTATION CODES:   Severe malnutrition in context of chronic illness, Underweight  INTERVENTION:  Will downgrade diet to dysphagia 3 (mechanical soft) in setting of poor dentition and difficulty chewing.  Provide Premier Protein po BID, each supplement provides 160 kcal and 30 grams of protein.  Continue MVI daily, thiamine 100 mg daily, and folic acid 1 mg daily in setting of EtOH abuse.  Provide vitamin C 250 mg BID. Patient is at risk for deficiency.  Had long discussion with patient about diet. Encouraged intake of 3 balanced meals throughout the day. Discussed the plate method and encouraged patient to choose a good source of protein and a complex carbohydrate at each meal. Discussed that adequate calories and protein are needed for weight gain and that by taking insulin his body will be able to use the carbohydrates he eats for energy. Patient seems to understand now that not taking his insulin regularly was partially contributing to his weight loss. Discussed limiting added sugars (soda, candy, sweets) that can cause a spike in his blood sugars and also do not provide the same nutrition as a well-balanced meal.  NUTRITION DIAGNOSIS:   Severe Malnutrition related to chronic illness(CHF, EtOH abuse) as evidenced by severe fat depletion, severe muscle depletion.  GOAL:   Patient will meet greater than or equal to 90% of their needs  MONITOR:   PO intake, Supplement acceptance, Labs, Weight trends, Skin, I & O's  REASON FOR ASSESSMENT:   Consult, Other (Comment)(low BMI) Poor PO, Diet education  ASSESSMENT:   62 year old male with PMHx of DM, CHF, EtOH abuse who was admitted with DKA, hypernatremia, AKI due to dehydration.   -Per chart patient is an everyday smoker.  Met with patient at bedside. He reports he has had a poor appetite lately. He reports that when his blood sugars get too high he gets nauseas and experiences anorexia. He has  had diabetes for about 10 years now (since age 39). He reports initially he was just on metformin, but later he was started on insulin. His insulin dose was recently increased. He does not take insulin regularly. He also reports his blood sugar gets higher when he drinks alcohol. He typically only eats one meal per day. He may have fried chicken and green beans or pinto beans. Also occasionally has macaroni and cheese. He reports that he eats fairly small portions. He avoids milkshakes and other foods that are "too sweet" because they make his blood sugar go "sky high." He has some difficulty chewing in setting of poor dentition and needs soft/chopped foods. He denies any food allergies or intolerances. He is amenable to drinking Premier Protein to help meet his calorie/protein needs.  UBW was 150 lbs. Patient reports he has lost weight over time but is unsure of specific weight history. No weight history in chart to trend.  Meal Completion: pt only had bites of breakfast this morning  Medications reviewed and include: folic acid 1 mg daily, Novolog 0-9 units TID, Novolog 0-5 units QHS, Lantus 15 units QHS, MVI daily, thiamine 100 mg daily, 1/2 NS at 50 mL/hr.  Labs reviewed: CBG 82-409 past 24 hrs, Sodium 146 (trending down), BUN 38 (trending down), HgbA1c 13.5.  NUTRITION - FOCUSED PHYSICAL EXAM:    Most Recent Value  Orbital Region  Severe depletion  Upper Arm Region  Severe depletion  Thoracic and Lumbar Region  Severe depletion  Buccal Region  Severe depletion  Temple Region  Severe depletion  Clavicle Bone Region  Severe depletion  Clavicle and Acromion Bone Region  Severe depletion  Scapular Bone Region  Severe depletion  Dorsal Hand  Moderate depletion  Patellar Region  Severe depletion  Anterior Thigh Region  Severe depletion  Posterior Calf Region  Severe depletion  Edema (RD Assessment)  None  Hair  Reviewed  Eyes  Reviewed  Mouth  Reviewed [poor dentition]  Skin  Reviewed  [scattered ecchymosis]  Nails  Reviewed     Diet Order:  Diet Carb Modified Fluid consistency: Thin; Room service appropriate? Yes Diet - low sodium heart healthy  EDUCATION NEEDS:   Education needs have been addressed  Skin:  Skin Assessment: Reviewed RN Assessment(ecchymosis to arms, legs, shoulders)  Last BM:  PTA (04/24/2017 per chart)  Height:   Ht Readings from Last 1 Encounters:  04/28/17 5' 11"  (1.803 m)    Weight:   Wt Readings from Last 1 Encounters:  04/28/17 123 lb 14.4 oz (56.2 kg)    Ideal Body Weight:  78.2 kg  BMI:  Body mass index is 17.28 kg/m.  Estimated Nutritional Needs:   Kcal:  1680-1960 (MSJ x 1.2-1.4)  Protein:  84-95 grams (1.5-1.7 grams/kg)  Fluid:  1.6-1.9 L/day (30-35 mL/kg)  Willey Blade, MS, RD, LDN Office: [phone] Pager: [phone] After Hours/Weekend Pager: [phone]

## 2017-04-28 NOTE — Evaluation (Signed)
Physical Therapy Evaluation Patient Details Name: Shawn [name] MRN: [id number] DOB: 1955-02-10 Today's Date: 04/28/2017   History of Present Illness  Pt is a 62 yo M with past medical history of diabetes and heart failure presents to the emergency department via EMS due to his wife's concern for high blood sugar. For the last 4days the patient has not been eating. He wants to drink only orange juice and milk. He has not had any nausea or vomiting. The patient's wife states that they hadbeen drinking liquor over the weekend. Prior to that the patient had gone to his primary care doctor who had increased his insulin. The wife reports that he rotates his insulin injection sites and that has been using it as directed. She called EMS because the patient could not hold onto objects and appeared to be slowing and mildly confused. Laboratory evaluation revealed glucose greater than 1300 as well as increased anion gap consistent with diabetic ketoacidosis. The patient was started on insulin drip prior to the hospitalist service being called for admission.  Assessment includes: DKA in setting of severe alcohol intake, heart failure currently compensated, alcohol abuse on CIWA, and AKI due to dehydration.    Clinical Impression  Pt presents with deficits in strength, transfers, mobility, gait, balance, and activity tolerance.  Pt required extra time and effort but no physical assistance with sup to/from sit, rolling, and transfers.  Pt leaned heavily on RW during limited amb with flexed trunk and slow cadence with extensive cues to amb closer to the RW.  Pt is at risk for further functional decline and will benefit from HHPT services upon discharge to safely address above deficits for decreased caregiver assistance and eventual return to PLOF.      Follow Up Recommendations Home health PT;Supervision for mobility/OOB    Equipment Recommendations  Rolling walker with 5" wheels     Recommendations for Other Services       Precautions / Restrictions Precautions Precautions: Fall Restrictions Weight Bearing Restrictions: No      Mobility  Bed Mobility Overal bed mobility: Modified Independent             General bed mobility comments: Extra time and effort but no physical assistance required with sup to/from sit or rolling  Transfers Overall transfer level: Needs assistance Equipment used: Rolling walker (2 wheeled) Transfers: Sit to/from Stand Sit to Stand: Min guard         General transfer comment: Extra time and effort but no physical assistance required with transfers from various height surfaces  Ambulation/Gait Ambulation/Gait assistance: Min guard Ambulation Distance (Feet): 40 Feet Assistive device: Rolling walker (2 wheeled) Gait Pattern/deviations: Trunk flexed;Decreased step length - right;Decreased step length - left;Step-through pattern Gait velocity: Decreased   General Gait Details: Mod to max verbal cues for amb closer to RW with pt ambulating with trunk flexed and with slow, effortful cadence  Stairs            Wheelchair Mobility    Modified Rankin (Stroke Patients Only)       Balance Overall balance assessment: Needs assistance Sitting-balance support: Feet supported;No upper extremity supported Sitting balance-Leahy Scale: Good     Standing balance support: Bilateral upper extremity supported Standing balance-Leahy Scale: Fair                               Pertinent Vitals/Pain Pain Assessment: No/denies pain    Home Living Family/patient expects  to be discharged to:: Private residence Living Arrangements: Spouse/significant other Available Help at Discharge: Available PRN/intermittently;Family Type of Home: House Home Access: Ramped entrance     Home Layout: One level Home Equipment: [place] - single point      Prior Function Level of Independence: Independent         Comments:  Ind amb limited community distances with no fall history, Ind with ADLs     Hand Dominance   Dominant Hand: Right    Extremity/Trunk Assessment   Upper Extremity Assessment Upper Extremity Assessment: Generalized weakness    Lower Extremity Assessment Lower Extremity Assessment: Generalized weakness       Communication   Communication: No difficulties  Cognition Arousal/Alertness: Awake/alert Behavior During Therapy: Flat affect Overall Cognitive Status: Within Functional Limits for tasks assessed                                        General Comments      Exercises     Assessment/Plan    PT Assessment Patient needs continued PT services  PT Problem List Decreased strength;Decreased activity tolerance;Decreased balance;Decreased mobility;Decreased knowledge of use of DME       PT Treatment Interventions DME instruction;Gait training;Functional mobility training;Balance training;Therapeutic exercise;Therapeutic activities;Patient/family education    PT Goals (Current goals can be found in the Care Plan section)  Acute Rehab PT Goals Patient Stated Goal: To be stronger PT Goal Formulation: With patient Time For Goal Achievement: 05/11/17 Potential to Achieve Goals: Good    Frequency Min 2X/week   Barriers to discharge        Co-evaluation               AM-PAC PT "6 Clicks" Daily Activity  Outcome Measure Difficulty turning over in bed (including adjusting bedclothes, sheets and blankets)?: A Little Difficulty moving from lying on back to sitting on the side of the bed? : A Little Difficulty sitting down on and standing up from a chair with arms (e.g., wheelchair, bedside commode, etc,.)?: Unable Help needed moving to and from a bed to chair (including a wheelchair)?: A Little Help needed walking in hospital room?: A Little Help needed climbing 3-5 steps with a railing? : A Little 6 Click Score: 16    End of Session Equipment  Utilized During Treatment: Gait belt Activity Tolerance: Patient limited by fatigue Patient left: in chair;with chair alarm set;with nursing/sitter in room;with call bell/phone within reach Nurse Communication: Mobility status PT Visit Diagnosis: Difficulty in walking, not elsewhere classified (R26.2);Muscle weakness (generalized) (M62.81)    Time: 8242-3536 PT Time Calculation (min) (ACUTE ONLY): 33 min   Charges:   PT Evaluation $PT Eval Low Complexity: 1 Low     PT G Codes:        DRoyetta Asal PT, DPT 04/28/17, 3:21 PM

## 2017-04-29 ENCOUNTER — Inpatient Hospital Stay (HOSPITAL_COMMUNITY)
Admit: 2017-04-29 | Discharge: 2017-04-29 | Disposition: A | Discharge status: Home / Self Care | Payer: Medicaid Other | Attending: Physician Assistant | Admitting: Physician Assistant

## 2017-04-29 ENCOUNTER — Encounter: Payer: Self-pay | Admitting: Physician Assistant

## 2017-04-29 ENCOUNTER — Telehealth: Payer: Self-pay

## 2017-04-29 DIAGNOSIS — E1111 Type 2 diabetes mellitus with ketoacidosis with coma: Principal | ICD-10-CM

## 2017-04-29 DIAGNOSIS — I429 Cardiomyopathy, unspecified: Secondary | ICD-10-CM

## 2017-04-29 DIAGNOSIS — I10 Essential (primary) hypertension: Secondary | ICD-10-CM

## 2017-04-29 DIAGNOSIS — F1910 Other psychoactive substance abuse, uncomplicated: Secondary | ICD-10-CM

## 2017-04-29 DIAGNOSIS — I420 Dilated cardiomyopathy: Secondary | ICD-10-CM

## 2017-04-29 LAB — CBC
HCT: 38.4 % — ABNORMAL LOW (ref 40.0–52.0)
Hemoglobin: 13.2 g/dL (ref 13.0–18.0)
MCH: 32.1 pg (ref 26.0–34.0)
MCHC: 34.4 g/dL (ref 32.0–36.0)
MCV: 93.5 fL (ref 80.0–100.0)
PLATELETS: 216 10*3/uL (ref 150–440)
RBC: 4.11 MIL/uL — ABNORMAL LOW (ref 4.40–5.90)
RDW: 13.7 % (ref 11.5–14.5)
WBC: 6.3 10*3/uL (ref 3.8–10.6)

## 2017-04-29 LAB — BASIC METABOLIC PANEL
ANION GAP: 7 (ref 5–15)
BUN: 18 mg/dL (ref 6–20)
CHLORIDE: 99 mmol/L — AB (ref 101–111)
CO2: 30 mmol/L (ref 22–32)
Calcium: 8.6 mg/dL — ABNORMAL LOW (ref 8.9–10.3)
Creatinine, Ser: 0.64 mg/dL (ref 0.61–1.24)
GFR calc Af Amer: >60 mL/min (ref >60)
GFR calc non Af Amer: >60 mL/min (ref >60)
Glucose, Bld: 172 mg/dL — ABNORMAL HIGH (ref 65–99)
POTASSIUM: 3.8 mmol/L (ref 3.5–5.1)
Sodium: 136 mmol/L (ref 135–145)

## 2017-04-29 LAB — GLUCOSE, CAPILLARY
Glucose-Capillary: 173 mg/dL — ABNORMAL HIGH (ref 65–99)
Glucose-Capillary: 180 mg/dL — ABNORMAL HIGH (ref 65–99)

## 2017-04-29 LAB — ECHOCARDIOGRAM COMPLETE
Height: 71 in
WEIGHTICAEL: 1982.4 [oz_av]

## 2017-04-29 LAB — BRAIN NATRIURETIC PEPTIDE: B NATRIURETIC PEPTIDE 5: 317 pg/mL — AB (ref 0.0–100.0)

## 2017-04-29 LAB — TSH: TSH: 1.705 u[IU]/mL (ref 0.350–4.500)

## 2017-04-29 MED ORDER — CARVEDILOL 3.125 MG PO TABS: 3.1250 mg | ORAL_TABLET | Freq: Two times a day (BID) | ORAL | 1 refills | Status: DC

## 2017-04-29 MED ORDER — GUAIFENESIN-DM 100-10 MG/5ML PO SYRP
5.0000 mL | ORAL_SOLUTION | ORAL | Status: DC | PRN
  Filled 2017-04-29: qty 5

## 2017-04-29 MED ORDER — INSULIN GLARGINE 100 UNIT/ML ~~LOC~~ SOLN
18.0000 [IU] | Freq: Every day | SUBCUTANEOUS | Status: DC
  Filled 2017-04-29: qty 0.18

## 2017-04-29 MED ORDER — ADULT MULTIVITAMIN W/MINERALS CH: 1.0000 | ORAL_TABLET | Freq: Every day | ORAL | 1 refills | Status: DC

## 2017-04-29 MED ORDER — INSULIN ASPART 100 UNIT/ML ~~LOC~~ SOLN
3.0000 [IU] | Freq: Three times a day (TID) | SUBCUTANEOUS | Status: DC
  Administered 2017-04-29 (×2): 3 [IU] via SUBCUTANEOUS
  Filled 2017-04-29 (×2): qty 1

## 2017-04-29 MED ORDER — ASPIRIN 81 MG PO CHEW: 81.0000 mg | CHEWABLE_TABLET | Freq: Every day | ORAL | 1 refills | Status: DC

## 2017-04-29 MED ORDER — THIAMINE HCL 100 MG PO TABS: 100.0000 mg | ORAL_TABLET | Freq: Every day | ORAL | 0 refills | Status: DC

## 2017-04-29 MED ORDER — FOLIC ACID 1 MG PO TABS: 1.0000 mg | ORAL_TABLET | Freq: Every day | ORAL | 1 refills | Status: DC

## 2017-04-29 NOTE — Progress Notes (Signed)
Patient discharged with spouse, IV's removed, catheter in tact. Patient verbalized understanding of education, patient discharged with no complaints.

## 2017-04-29 NOTE — Telephone Encounter (Signed)
TCM....  Patient is being discharged   They saw R. Dunn   They are scheduled to see Dr. Rockey Situ on 5/10  They were seen for chronic systolic CHF/dilated CM  They need to be seen within 1 week  Pt added to wait list   Please call

## 2017-04-29 NOTE — Consult Note (Signed)
Cardiology Consultation:   Patient ID: Shawn [name]; [id number]; Dec 31, 1955   Admit date: 04/27/2017 Date of Consult: 04/29/2017  Primary Care Provider: System, Pcp Not In Primary Cardiologist: New to [facility] - consult by [facility]   Patient Profile:   Shawn [name] is a 62 y.o. male with a hx of chronic systolic CHF/dilated cardiomyopathy, poorly controlled IDDM with DKA, prior strokes, polysubstance abuse, and HTN with ongoing alcohol and tobacco abuse at 1 pack daily for 10 years who is being seen today for the evaluation of chronic systolic CHF at the request of Dr. Bridgett Larsson.  History of Present Illness:   Mr. Oquendo was previously evaluated by Dr. Humphrey Rolls in 06/2013 for elevated troponin with a peak of 0.22 and chest pain. Echo at that time showed an EF of < 20 as detailed below. Cardiac catheterization during that admission showed normal coronaries per discharge summary with severely dilated LV and EF of 10%. He was diuresed and started on Coreg and hydralazine, as well as continued on lisinopril, spironolactone and digoxin. He was apparently discharged with a LifeVest. He reports he had been followed by the [facility] health [facility], though cannot recall when he was last seen by them. He does not know if he has been taking any PO medications at home.   Patient was admitted with DKA with HGB A1c > 15 He has been started on an insulin gtt per IM. Rounding hospitalist on 4/25 placed consult for chronic systolic CHF given prior echo from 06/2013 admission when he was seen by outside cardiology group showed an EF of < 20%. No troponin or BNP has been checked. No TSH. Initial blood sugar of > 1300. Initial potassium of 6.6, now improved to 3.8. Magnesium 3.0-->2.5. UDS negative. Ethanol < 10. CBC from 4/24 stable, showing a macrocytosis. A1c 15.1. CXR negative. CT head with chronic bifrontal infarcts, C-spine not acute. EKG as below. Vitals stable. Weight 123 pounds. IM has started the patient on  Coreg. Low-grade fever this morning of 99. Upon his admission, he was only taking insulin per prior to admission medications. He denies any lower extremity swelling or abdominal distension. He has stable 2-pillow orthopnea. He has a cough productive of yellow sputum. No chest pain.   Past Medical History:  Diagnosis Date  . Chronic systolic CHF (congestive heart failure) ([place])    a. TTE 6/15: EF < 20%, mildly dilated LV, DD, mildly dilated LA, mod dilated RA, mild MR, mild to mod TR, mod increased posterior wall thickness, elevated LA and LVEDP  . Diabetes mellitus with complication ([place])   . Polysubstance abuse ([place])    a. etoh and tobacco  . Stroke [facility])     History reviewed. No pertinent surgical history.   Home Meds: Prior to Admission medications   Medication Sig Start Date End Date Taking? Authorizing Provider  insulin aspart (NOVOLOG) 100 UNIT/ML injection Inject 5 Units into the skin 3 (three) times daily with meals.   Yes [provider]  insulin glargine (LANTUS) 100 UNIT/ML injection Inject 15 Units into the skin at bedtime.   Yes [provider]    Inpatient Medications: Scheduled Meds: . aspirin  81 mg Oral Daily  . carvedilol  3.125 mg Oral BID WC  . folic acid  1 mg Oral Daily  . heparin  5,000 Units Subcutaneous Q8H  . insulin aspart  0-5 Units Subcutaneous QHS  . insulin aspart  0-9 Units Subcutaneous TID WC  . insulin aspart  3 Units Subcutaneous TID WC  . insulin glargine  18 Units Subcutaneous QHS  . multivitamin with minerals  1 tablet Oral Daily  . protein supplement shake  11 oz Oral BID BM  . thiamine  100 mg Oral Daily   Or  . thiamine  100 mg Intravenous Daily  . vitamin C  250 mg Oral BID   Continuous Infusions: . sodium chloride 50 mL/hr at 04/29/17 0516   PRN Meds: LORazepam **OR** LORazepam  Allergies:  No Known Allergies  Social History:   Social History   Socioeconomic History  . Marital status: Single    Spouse  name: Not on file  . Number of children: Not on file  . Years of education: Not on file  . Highest education level: Not on file  Occupational History  . Not on file  Social Needs  . Financial resource strain: Not on file  . Food insecurity:    Worry: Not on file    Inability: Not on file  . Transportation needs:    Medical: Not on file    Non-medical: Not on file  Tobacco Use  . Smoking status: Current Every Day Smoker  . Smokeless tobacco: Never Used  Substance and Sexual Activity  . Alcohol use: Not Currently    Frequency: Never  . Drug use: Not on file  . Sexual activity: Not on file  Lifestyle  . Physical activity:    Days per week: Not on file    Minutes per session: Not on file  . Stress: Not on file  Relationships  . Social connections:    Talks on phone: Not on file    Gets together: Not on file    Attends religious service: Not on file    Active member of club or organization: Not on file    Attends meetings of clubs or organizations: Not on file    Relationship status: Not on file  . Intimate partner violence:    Fear of current or ex partner: Not on file    Emotionally abused: Not on file    Physically abused: Not on file    Forced sexual activity: Not on file  Other Topics Concern  . Not on file  Social History Narrative  . Not on file     Family History:  Family History  Problem Relation Age of Onset  . Congestive Heart Failure Mother   . Diabetes Mother   . Congestive Heart Failure Brother     ROS:  Review of Systems  Constitutional: Positive for malaise/fatigue. Negative for chills, diaphoresis, fever and weight loss.  HENT: Negative for congestion.   Eyes: Negative for discharge and redness.  Respiratory: Positive for cough and sputum production. Negative for hemoptysis, shortness of breath and wheezing.        Yellow sputum  Cardiovascular: Negative for chest pain, palpitations, orthopnea, claudication, leg swelling and PND.    Gastrointestinal: Negative for abdominal pain, blood in stool, heartburn, melena, nausea and vomiting.  Genitourinary: Positive for frequency and urgency. Negative for hematuria.  Musculoskeletal: Negative for falls and myalgias.  Skin: Negative for rash.  Neurological: Positive for weakness. Negative for dizziness, tingling, tremors, sensory change, speech change, focal weakness and loss of consciousness.  Endo/Heme/Allergies: Does not bruise/bleed easily.  Psychiatric/Behavioral: Negative for substance abuse. The patient is not nervous/anxious.   All other systems reviewed and are negative.     Physical Exam/Data:   Vitals:   04/28/17 1321 04/28/17 1514 04/28/17 2020  04/29/17 0517  BP: (!) 145/93  (!) 141/94 138/90  Pulse: 67 65 64 66  Resp: 20  16 16   Temp: (!) 97.5 F (36.4 C)  98.5 F (36.9 C) 99 F (37.2 C)  TempSrc: Oral  Oral Oral  SpO2: 98% 95% 100% 99%  Weight:      Height:        Intake/Output Summary (Last 24 hours) at 04/29/2017 0746 Last data filed at 04/29/2017 3149 Gross per 24 hour  Intake 875 ml  Output 900 ml  Net -25 ml   Filed Weights   04/27/17 0129 04/28/17 0410  Weight: 116 lb (52.6 kg) 123 lb 14.4 oz (56.2 kg)   Body mass index is 17.28 kg/m.   Physical Exam: General: Frail appearing, in no acute distress. Head: Normocephalic, atraumatic, sclera non-icteric, no xanthomas, nares without discharge.  Neck: Negative for carotid bruits. JVD not elevated. Lungs: Clear bilaterally to auscultation without wheezes, rales, or rhonchi. Breathing is unlabored. Heart: RRR with S1 S2. No murmurs, rubs, or gallops appreciated. Abdomen: Soft, non-tender, non-distended with normoactive bowel sounds. No hepatomegaly. No rebound/guarding. No obvious abdominal masses. Msk:  Strength and tone appear normal for age. Extremities: No clubbing or cyanosis. No edema. Distal pedal pulses are 2+ and equal bilaterally. Neuro: Alert and oriented X 3. No facial asymmetry.  No focal deficit. Moves all extremities spontaneously. Psych:  Responds to questions appropriately with a normal affect.   EKG:  The EKG was personally reviewed and demonstrates: NSR, 68 bpm, LVH with early repolarization, possible prior anterior infarct, right atrial enlargement Telemetry:  Telemetry was personally reviewed and demonstrates: not on telemetry   Weights: Filed Weights   04/27/17 0129 04/28/17 0410  Weight: 116 lb (52.6 kg) 123 lb 14.4 oz (56.2 kg)    Relevant CV Studies: TTE 06/2013: Summary:   1. Left ventricular ejection fraction, by visual estimation, is <20%.   2. Elevated left atrial and left ventricular end-diastolic pressures.   3. Restrictive pattern of LV diastolic filling.   4. Mildly dilated left atrium.   5. Moderately dilated right atrium.   6. Mild mitral valve regurgitation.   7. Mildly increased left ventricular internal cavity size.   8. Mild to moderate tricuspid regurgitation.   9. Moderately increased left ventricular posterior wall thickness.   Laboratory Data:  Chemistry Recent Labs  Lab 04/27/17 1736 04/28/17 0525 04/29/17 0332  NA 148* 146* 136  K 3.6 4.0 3.8  CL 112* 110 99*  CO2 32 31 30  GLUCOSE 174* 151* 172*  BUN 52* 38* 18  CREATININE 0.90 0.88 0.64  CALCIUM 9.2 9.0 8.6*  GFRNONAA >60 >60 >60  GFRAA >60 >60 >60  ANIONGAP 4* 5 7    Recent Labs  Lab 04/27/17 0206 04/27/17 0529  PROT 5.4* 5.4*  ALBUMIN 3.2* 3.3*  AST 79* 59*  ALT 32 30  ALKPHOS 111 113  BILITOT 1.2 1.4*   Hematology Recent Labs  Lab 04/27/17 0132  WBC 7.0  RBC 4.32*  HGB 13.9  HCT 45.1  MCV 104.4*  MCH 32.1  MCHC 30.7*  RDW 14.9*  PLT 333   Cardiac EnzymesNo results for input(s): TROPONINI in the last 168 hours. No results for input(s): TROPIPOC in the last 168 hours.  BNPNo results for input(s): BNP, PROBNP in the last 168 hours.  DDimer No results for input(s): DDIMER in the last 168 hours.  Radiology/Studies:  Ct Head Wo  Contrast  Result Date: 04/27/2017 IMPRESSION: 1. Chronic small  bifrontal infarcts with encephalomalacia. 2. Chronic small vessel ischemic disease. No acute intracranial abnormality or skull fracture. 3. No acute cervical spine fracture or posttraumatic listhesis. Degenerative disc disease at C5-6 with broad-based central disc bulge and mild bilateral foraminal encroachment from uncinate spurs. Electronically Signed   By: Ashley Royalty M.D.   On: 04/27/2017 02:17   Ct Cervical Spine Wo Contrast  Result Date: 04/27/2017 IMPRESSION: 1. Chronic small bifrontal infarcts with encephalomalacia. 2. Chronic small vessel ischemic disease. No acute intracranial abnormality or skull fracture. 3. No acute cervical spine fracture or posttraumatic listhesis. Degenerative disc disease at C5-6 with broad-based central disc bulge and mild bilateral foraminal encroachment from uncinate spurs. Electronically Signed   By: Ashley Royalty M.D.   On: 04/27/2017 02:17   Dg Chest Portable 1 View  Result Date: 04/27/2017 IMPRESSION: No active disease. Electronically Signed   By: Lucienne Capers M.D.   On: 04/27/2017 02:01    Assessment and Plan:   1. Chronic systolic CHF/dilated CM: -Normal coronary arteries by [facility] in 06/2013 -He does not appear decompensated at this time -Check an echo, BNP, and TSH -Agree with addition of Coreg -Further evaluation/management pending echo -CHF education -Daily weights with strict Is and Os  2. Poorly controlled IDDM with DKA: -Per IM  3. HTN: -BP reasonably controlled -Continue Coreg as above  4. Hyperkalemia: -Improved -Monitor -Maintain K+ > 4.0  3. Polysubstance abuse: -Cessation advised    For questions or updates, please contact [place] Please consult [email or web address] for contact info under Cardiology/STEMI.   Signed, Christell Faith, PA-C [facility] Pager: [phone] 04/29/2017, 7:46 AM

## 2017-04-29 NOTE — Discharge Instructions (Signed)
Heart healthy and ADA diet. HHPT Alcohol cessation.

## 2017-04-29 NOTE — Progress Notes (Signed)
Inpatient Diabetes Program Recommendations  AACE/ADA: New Consensus Statement on Inpatient Glycemic Control (2015)  Target Ranges:  Prepandial:   less than 140 mg/dL      Peak postprandial:   less than 180 mg/dL (1-2 hours)      Critically ill patients:  140 - 180 mg/dL   Lab Results  Component Value Date   GLUCAP 180 (H) 04/29/2017   HGBA1C 13.5 (H) 04/28/2017    Review of Glycemic ControlResults for ILYAAS, MUSTO (MRN [id number]) as of 04/29/2017 15:14  Ref. Range 04/28/2017 16:37 04/28/2017 20:22 04/28/2017 22:23 04/29/2017 07:50 04/29/2017 11:59  Glucose-Capillary Latest Ref Range: 65 - 99 mg/dL 203 (H) 224 (H) 199 (H) 173 (H) 180 (H)   Diabetes history: Type 2 DM  Outpatient Diabetes medications: Lantus 15 units daily, Novolog 5 units tid with meals Current orders for Inpatient glycemic control: Lantus 15 units q HS, Novolog sensitive tid with meals and HS Inpatient Diabetes Program Recommendations:  Note plans for d/c today.  Discussed with patient and reminded him of importance of taking insulin and checking blood sugars.  Also discussed with RN and asked her to please let wife know as well so that she can support him and remind him to take insulin.   Thanks,  Adah Perl, RN, BC-ADM Inpatient Diabetes Coordinator Pager [phone] (8a-5p)

## 2017-04-29 NOTE — Telephone Encounter (Signed)
Patient currently admitted. TCM call for Monday 4/29.

## 2017-04-29 NOTE — Care Management (Signed)
Physical therapy evaluation completed. Recommending home with home health/physical therapy Sharmon Revere RN representative for [employer] updated, States she will  Contact Veteran's person in [place] to get this authorization stated. Rolling Walker per [facility]. Will update Floydene Flock, [place] representative Discharge to home today per Dr. Haydee Monica RN MSN CCM Care Management [phone]

## 2017-04-29 NOTE — Discharge Summary (Signed)
[facility] NAME: Shawn [name]    MR#:  [id number]  DATE OF BIRTH:  1955-05-15  DATE OF ADMISSION:  04/27/2017   ADMITTING PHYSICIAN: Harrie Foreman, MD  DATE OF DISCHARGE: No discharge date for patient encounter.  PRIMARY CARE PHYSICIAN: System, Pcp Not In   ADMISSION DIAGNOSIS:  Altered mental status, unspecified altered mental status type [R41.82] Diabetic ketoacidosis with coma associated with type 2 diabetes mellitus ([place]) [E11.11] DISCHARGE DIAGNOSIS:  Active Problems:   DKA (diabetic ketoacidoses) ([place])  SECONDARY DIAGNOSIS:   Past Medical History:  Diagnosis Date  . Chronic systolic CHF (congestive heart failure) ([place])    a. TTE 6/15: EF < 20%, mildly dilated LV, DD, mildly dilated LA, mod dilated RA, mild MR, mild to mod TR, mod increased posterior wall thickness, elevated LA and LVEDP  . Diabetes mellitus with complication ([place])   . Polysubstance abuse ([place])    a. etoh and tobacco  . Stroke [facility])    HOSPITAL COURSE:   This is a 62 year old male admitted for DKA. 1. DKA: In setting of severe alcohol intake Improved obese insulin drip and aggressive IV fluids Started Lantus 15 units at at bedtime, NovoLog 3 units AC and sliding scale. Sugars controlled.  Hemoglobin A1c 13. 5.  Hypernatremia.  improved with half-normal saline.   2.   Chronic systolic heart failure, EF 25-30% per echo today.   stable.  Not on any medication, cardiology consult.  Started Coreg and defer cardiology consult for ACE inhibitor or Entresto.  Follow-up with Dr. Rockey Situ as outpatient.  Discussed with Dr. Rockey Situ.  3. Alcohol abuse: CIWA scale  4.  Acute kidney injury due to dehydration, improved with IV fluid.  Severe malnutrition.  Dietitian consult. Generalized weakness.  PT evaluation suggested home health and PT. DISCHARGE CONDITIONS:  The patient will be discharged to home with home health and PT today. CONSULTS  OBTAINED:  Treatment Team:  Minna Merritts, MD DRUG ALLERGIES:  No Known Allergies DISCHARGE MEDICATIONS:   Allergies as of 04/29/2017   No Known Allergies     Medication List    TAKE these medications   aspirin 81 MG chewable tablet Chew 1 tablet (81 mg total) by mouth daily. Start taking on:  04/30/2017   carvedilol 3.125 MG tablet Commonly known as:  COREG Take 1 tablet (3.125 mg total) by mouth 2 (two) times daily with a meal.   folic acid 1 MG tablet Commonly known as:  FOLVITE Take 1 tablet (1 mg total) by mouth daily. Start taking on:  04/30/2017   insulin aspart 100 UNIT/ML injection Commonly known as:  novoLOG Inject 5 Units into the skin 3 (three) times daily with meals.   insulin glargine 100 UNIT/ML injection Commonly known as:  LANTUS Inject 15 Units into the skin at bedtime.   multivitamin with minerals Tabs tablet Take 1 tablet by mouth daily. Start taking on:  04/30/2017   thiamine 100 MG tablet Take 1 tablet (100 mg total) by mouth daily. Start taking on:  04/30/2017            Durable Medical Equipment  (From admission, onward)        Start     Ordered   04/29/17 0720  For home use only DME Walker rolling  Once    Question:  Patient needs a walker to treat with the following condition  Answer:  Weakness   04/29/17 0720  DISCHARGE INSTRUCTIONS:  See AVS.  If you experience worsening of your admission symptoms, develop shortness of breath, life threatening emergency, suicidal or homicidal thoughts you must seek medical attention immediately by calling 911 or calling your MD immediately  if symptoms less severe.  You Must read complete instructions/literature along with all the possible adverse reactions/side effects for all the Medicines you take and that have been prescribed to you. Take any new Medicines after you have completely understood and accpet all the possible adverse reactions/side effects.   Please note  You were  cared for by a hospitalist during your hospital stay. If you have any questions about your discharge medications or the care you received while you were in the hospital after you are discharged, you can call the unit and asked to speak with the hospitalist on call if the hospitalist that took care of you is not available. Once you are discharged, your primary care physician will handle any further medical issues. Please note that NO REFILLS for any discharge medications will be authorized once you are discharged, as it is imperative that you return to your primary care physician (or establish a relationship with a primary care physician if you do not have one) for your aftercare needs so that they can reassess your need for medications and monitor your lab values.    On the day of Discharge:  VITAL SIGNS:  Blood pressure 119/74, pulse 64, temperature 98.9 F (37.2 C), temperature source Oral, resp. rate (!) 21, height 5\' 11"  (1.803 m), weight 123 lb 14.4 oz (56.2 kg), SpO2 98 %. PHYSICAL EXAMINATION:  GENERAL:  62 y.o.-year-old patient lying in the bed with no acute distress.  EYES: Pupils equal, round, reactive to light and accommodation. No scleral icterus. Extraocular muscles intact.  HEENT: Head atraumatic, normocephalic. Oropharynx and nasopharynx clear.  NECK:  Supple, no jugular venous distention. No thyroid enlargement, no tenderness.  LUNGS: Normal breath sounds bilaterally, no wheezing, rales,rhonchi or crepitation. No use of accessory muscles of respiration.  CARDIOVASCULAR: S1, S2 normal. No murmurs, rubs, or gallops.  ABDOMEN: Soft, non-tender, non-distended. Bowel sounds present. No organomegaly or mass.  EXTREMITIES: No pedal edema, cyanosis, or clubbing.  NEUROLOGIC: Cranial nerves II through XII are intact. Muscle strength 5/5 in all extremities. Sensation intact. Gait not checked.  PSYCHIATRIC: The patient is alert and oriented x 3.  SKIN: No obvious rash, lesion, or ulcer.    DATA REVIEW:   CBC Recent Labs  Lab 04/29/17 0332  WBC 6.3  HGB 13.2  HCT 38.4*  PLT 216    Chemistries  Recent Labs  Lab 04/27/17 0529  04/28/17 0525 04/29/17 0332  NA 132*   < > 146* 136  K 6.6*   < > 4.0 3.8  CL 93*   < > 110 99*  CO2 18*   < > 31 30  GLUCOSE 1,180*   < > 151* 172*  BUN 78*   < > 38* 18  CREATININE 1.84*   < > 0.88 0.64  CALCIUM 8.6*   < > 9.0 8.6*  MG  --   --  2.5*  --   AST 59*  --   --   --   ALT 30  --   --   --   ALKPHOS 113  --   --   --   BILITOT 1.4*  --   --   --    < > = values in this interval not displayed.  Microbiology Results  Results for orders placed or performed during the hospital encounter of 04/27/17  Urine Culture     Status: None   Collection Time: 04/27/17  1:48 AM  Result Value Ref Range Status   Specimen Description   Final    URINE, RANDOM Performed at [facility], [address] [place], [place] [address]    Special Requests   Final    Normal Performed at [facility], [address] [place], [place] [address]    Culture   Final    NO GROWTH Performed at [facility], [place] [address], [place], [place] [address]    Report Status 04/28/2017 FINAL  Final  MRSA PCR Screening     Status: None   Collection Time: 04/27/17  5:32 AM  Result Value Ref Range Status   MRSA by PCR NEGATIVE NEGATIVE Final    Comment:        The GeneXpert MRSA Assay (FDA approved for NASAL specimens only), is one component of a comprehensive MRSA colonization surveillance program. It is not intended to diagnose MRSA infection nor to guide or monitor treatment for MRSA infections. Performed at [facility], [address], [place], [place] [address]     RADIOLOGY:  No results found.   Management plans discussed with the patient, family and they are in agreement.  CODE STATUS: Full Code   TOTAL TIME TAKING CARE OF THIS PATIENT: 36 minutes.    Demetrios Loll M.D on 04/29/2017 at 2:07  PM  Between 7am to 6pm - Pager - [phone]  After 6pm go to [email or web address] - Proofreader  [facility]  Office  [phone]  CC: Primary care physician; System, Pcp Not In   Note: This dictation was prepared with Dragon dictation along with smaller phrase technology. Any transcriptional errors that result from this process are unintentional.

## 2017-04-29 NOTE — Progress Notes (Signed)
*  PRELIMINARY RESULTS* Echocardiogram 2D Echocardiogram has been performed.  Shawn [name] Asta Corbridge 04/29/2017, 10:00 AM

## 2017-05-02 NOTE — Telephone Encounter (Signed)
Patient contacted regarding discharge from [facility] on 04/29/17.  Patient understands to follow up with provider Dr. Rockey Situ on 05/13/17 at 2:40PM at [facility]. Patient understands discharge instructions? Yes Patient understands medications and regiment? Yes Patient understands to bring all medications to this visit? Yes  No other questions or concerns at this time.

## 2017-05-03 ENCOUNTER — Telehealth: Payer: Self-pay

## 2017-05-03 NOTE — Telephone Encounter (Signed)
EMMI Follow-up: Question on the report about new medications.  I called and asked to speak with Shawn [name] and wife said they had already received 2 calls from [facility] and appreciated everything we done for him.  No concerns at this time.  I explained there would be another automated call and to let us know if they have any questions upon follow-up.

## 2017-05-06 ENCOUNTER — Telehealth: Payer: Self-pay | Admitting: Licensed Clinical Social Worker

## 2017-05-06 NOTE — Telephone Encounter (Signed)
EMMI flagged patient for answering yes to loss of interest in things and yes to feeling sad/hoepless/anxious/empty. Clinical Education officer, museum (CSW) attempted to contact patient via telephone however he did not answer and a voicemail was left.   [employer], LCSW [phone]

## 2017-05-06 NOTE — Telephone Encounter (Signed)
Clinical Education officer, museum (CSW) received a call back from patient's wife Shawn [name]. Per [name] patient is asleep right now and can't come to the phone. Wife reported that he has been doing good since he left [facility]. 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 Wife reported that patient was in good spirits today. Wife reported that he has been feeling a little down because he can't do the things he use to like ride the lawnmower. Wife reported that patient is not depressed. CSW made wife aware that the [place] has mental health services if needed. Wife verbalized her understanding and thanked CSW for calling.   [employer], LCSW [phone]

## 2017-05-13 ENCOUNTER — Encounter

## 2017-05-13 ENCOUNTER — Encounter: Payer: Self-pay | Admitting: Cardiovascular Disease

## 2017-05-13 ENCOUNTER — Ambulatory Visit (INDEPENDENT_AMBULATORY_CARE_PROVIDER_SITE_OTHER): Payer: Medicaid Other | Admitting: Cardiovascular Disease

## 2017-05-13 VITALS — BP 114/60 | HR 83 | Ht 71.0 in | Wt 126.5 lb

## 2017-05-13 DIAGNOSIS — I428 Other cardiomyopathies: Secondary | ICD-10-CM | POA: Diagnosis not present

## 2017-05-13 DIAGNOSIS — Z794 Long term (current) use of insulin: Secondary | ICD-10-CM

## 2017-05-13 DIAGNOSIS — E1110 Type 2 diabetes mellitus with ketoacidosis without coma: Secondary | ICD-10-CM | POA: Diagnosis not present

## 2017-05-13 DIAGNOSIS — E1122 Type 2 diabetes mellitus with diabetic chronic kidney disease: Secondary | ICD-10-CM | POA: Insufficient documentation

## 2017-05-13 DIAGNOSIS — E118 Type 2 diabetes mellitus with unspecified complications: Secondary | ICD-10-CM | POA: Insufficient documentation

## 2017-05-13 DIAGNOSIS — F1910 Other psychoactive substance abuse, uncomplicated: Secondary | ICD-10-CM

## 2017-05-13 DIAGNOSIS — F17200 Nicotine dependence, unspecified, uncomplicated: Secondary | ICD-10-CM | POA: Diagnosis not present

## 2017-05-13 DIAGNOSIS — E119 Type 2 diabetes mellitus without complications: Secondary | ICD-10-CM | POA: Insufficient documentation

## 2017-05-13 DIAGNOSIS — F1010 Alcohol abuse, uncomplicated: Secondary | ICD-10-CM | POA: Diagnosis not present

## 2017-05-13 MED ORDER — LOSARTAN POTASSIUM 25 MG PO TABS: 25.0000 mg | ORAL_TABLET | Freq: Every day | ORAL | 3 refills | Status: DC

## 2017-05-13 MED ORDER — CARVEDILOL 3.125 MG PO TABS: 3.1250 mg | ORAL_TABLET | Freq: Two times a day (BID) | ORAL | 3 refills | Status: DC

## 2017-05-13 MED ORDER — CETIRIZINE HCL 10 MG PO TABS: 10.0000 mg | ORAL_TABLET | Freq: Every day | ORAL | 1 refills | Status: DC

## 2017-05-13 NOTE — Patient Instructions (Addendum)
Medication Instructions:   Generic of ZYRTEC: cetirazine, allergy  Start losartan 25 mg daily  [facility] 24/26/mg twice a day   Labwork:  No new labs needed  Testing/Procedures:  No further testing at this time   Follow-Up: It was a pleasure seeing you in the office today. Please call us if you have new issues that need to be addressed before your next appt.  [phone]  Your physician wants you to follow-up in: 1 months.  You will receive a reminder letter in the mail two months in advance. If you don't receive a letter, please call our office to schedule the follow-up appointment.  If you need a refill on your cardiac medications before your next appointment, please call your pharmacy.  For educational health videos Log in to : [email or web address] Or : [email or web address], password : [email or web address]

## 2017-05-13 NOTE — Progress Notes (Signed)
Cardiology Office Note  Date:  05/13/2017   ID:  Shawn [name], DOB 1955/08/03, MRN [id number]  PCP:  [facility]   Chief Complaint  Patient presents with  . other    F/u ED CHF no complaints today. Meds reviewed verbally with pt.    HPI:  62 year old gentleman with past medical history of  nonischemic cardiomyopathy, ejection fraction less than 20% dating back to 2015 Prior catheterization June 2015, poorly controlled insulin-dependent diabetes,  prior strokes,  polysubstance abuse including alcohol,  smoking 1 pack/day who continues to smoke,  Recent hospitalization forDKA hemoglobin A1c greater than 15 Started on insulin infusion Who presents to establish care in the [place] office for his cardiomyopathy  Recent hospitalization, no prior cardiac follow-up up to that hospitalization admission In the hospital 04/29/2017 Hospital records reviewed with the patient in detail Some of his medical care through the [place]  used to drink a lot, has not been drinking much recently  Was discharged on low-dose carvedilol  On admission  initial potassium 6.6 magnesium 3.0  He had CT scan Headshowing chronic infarcts  BNP 317, creatinine 0.64, BUN 18 potassium 3.8 TSH 1.7, hemoglobin A1c 13.5  Echocardiogram personally reviewed by myself showing severely depressed ejection fraction, 20%, global hypokinesis Unable to estimate right heart pressures  no significant change in echo compared to study June 2015  In follow-up today reports that he feels well with no complaints No near syncope or syncope Denies alcohol abuse, drug abuse Compliance with his medication Does not feel abdominal bloating, leg swelling, PND orthopnea  EKG personally reviewed by myself on today's visit showing normal sinus rhythm rate 83 bpm LVH   PMH:   has a past medical history of Chronic systolic CHF (congestive heart failure) ([place]), Diabetes mellitus with complication ([place]),  Polysubstance abuse ([place]), and Stroke ([place]).  PSH:   History reviewed. No pertinent surgical history.  Current Outpatient Medications  Medication Sig Dispense Refill  . aspirin 81 MG chewable tablet Chew 1 tablet (81 mg total) by mouth daily. 30 tablet 1  . carvedilol (COREG) 3.125 MG tablet Take 1 tablet (3.125 mg total) by mouth 2 (two) times daily with a meal. 678 tablet 3  . folic acid (FOLVITE) 1 MG tablet Take 1 tablet (1 mg total) by mouth daily. 30 tablet 1  . insulin aspart (NOVOLOG) 100 UNIT/ML injection Inject 5 Units into the skin 3 (three) times daily with meals.    . insulin glargine (LANTUS) 100 UNIT/ML injection Inject 15 Units into the skin at bedtime.    . Multiple Vitamin (MULTIVITAMIN WITH MINERALS) TABS tablet Take 1 tablet by mouth daily. 30 tablet 1  . thiamine 100 MG tablet Take 1 tablet (100 mg total) by mouth daily. 14 tablet 0  . cetirizine (ZYRTEC) 10 MG tablet Take 1 tablet (10 mg total) by mouth daily. 90 tablet 1  . losartan (COZAAR) 25 MG tablet Take 1 tablet (25 mg total) by mouth daily. 90 tablet 3   No current facility-administered medications for this visit.      Allergies:   Patient has no known allergies.   Social History:  The patient  reports that he has been smoking.  He has never used smokeless tobacco. He reports that he drank alcohol.   Family History:   family history includes Congestive Heart Failure in his brother and mother; Diabetes in his mother.    Review of Systems: Review of Systems  Constitutional: Negative.   Respiratory: Negative.  Cardiovascular: Negative.   Gastrointestinal: Negative.   Musculoskeletal: Negative.   Neurological: Negative.   Psychiatric/Behavioral: Negative.   All other systems reviewed and are negative.    PHYSICAL EXAM: VS:  BP 114/60 (BP Location: Left Arm, Patient Position: Sitting, Cuff Size: Normal)   Pulse 83   Ht 5\' 11"  (1.803 m)   Wt 126 lb 8 oz (57.4 kg)   BMI 17.64 kg/m  , BMI Body mass  index is 17.64 kg/m. GEN: very thin,   in no acute distress  HEENT: normal  Neck: no JVD, carotid bruits, or masses Cardiac: RRR; no murmurs, rubs, or gallops,no edema  Respiratory:  clear to auscultation bilaterally, normal work of breathing GI: soft, nontender, nondistended, + BS MS: no deformity or atrophy  Skin: warm and dry, no rash Neuro:  Strength and sensation are intact Psych: euthymic mood, full affect    Recent Labs: 04/27/2017: ALT 30 04/28/2017: Magnesium 2.5 04/29/2017: B Natriuretic Peptide 317.0; BUN 18; Creatinine, Ser 0.64; Hemoglobin 13.2; Platelets 216; Potassium 3.8; Sodium 136; TSH 1.705    Lipid Panel Lab Results  Component Value Date   CHOL 152 06/18/2013   HDL 80 (H) 06/18/2013   LDLCALC 59 06/18/2013   TRIG 67 06/18/2013      Wt Readings from Last 3 Encounters:  05/13/17 126 lb 8 oz (57.4 kg)  04/28/17 123 lb 14.4 oz (56.2 kg)  01/06/17 107 lb (48.5 kg)       ASSESSMENT AND PLAN:  Nonischemic cardiomyopathy Prior catheterization June 2015, no further ischemic work-up needed Coreg 3.125 mg twice daily add losartan 25 mg daily today add spironolactone at a later date In follow-up consider changing losartan to Entresto if blood pressure tolerates losartan  Type 2 diabetes mellitus with complication, with long-term current use of insulin ([place]) - Plan: EKG 12-Lead long history of poor control diabetes He reports numbers are improving now on insulin 5 in the morning 10 units at night Will need close follow-up with primary care, [facility]  Smoker We have encouraged him to continue to work on weaning his cigarettes and smoking cessation. He will continue to work on this and does not want any assistance with chantix.   Polysubstance abuse ([place]) Prior history of alcohol abus cardiac catheterizatione Seen in the hospital, reports that he had cut back Recommended complete alcohol cessation  Follow-up one month for consideration of change of  losartan to [facility]  Medical records reviewed From recent hospitalization  Total encounter time more than 45 minutes  Greater than 50% was spent in counseling and coordination of care with the patient   Orders Placed This Encounter  Procedures  . EKG 12-Lead     Signed, Esmond Plants, M.D., Ph.D. 05/13/2017  [place], [place]

## 2017-06-15 NOTE — Progress Notes (Signed)
Cardiology Office Note  Date:  06/16/2017   ID:  Shawn [name], DOB 1955-06-01, MRN [id number]  PCP:  [facility]   Chief Complaint  Patient presents with  . OTHER    1 month f/u c/o leg stiffness and weakness. Meds reviewed verbally with pt.    HPI:  62 year old gentleman with past medical history of  nonischemic cardiomyopathy, ejection fraction less than 20% dating back to 2015 Prior catheterization June 2015, poorly controlled insulin-dependent diabetes,  prior strokes,  polysubstance abuse including alcohol,  smoking 1 pack/day who continues to smoke,  Recent hospitalization forDKA hemoglobin A1c greater than 15 Started on insulin infusion Who presents for follow-up of his nonischemic dilated cardiomyopathy  In the hospital 04/29/2017 Some of his medical care through the [place]  used to drink a lot,  Was discharged on low-dose carvedilol  On admission  initial potassium 6.6 magnesium 3.0  He had CT scan Head showing chronic infarcts  BNP 317, creatinine 0.64, BUN 18 potassium 3.8 TSH 1.7, hemoglobin A1c 13.5  Echocardiogram  severely depressed ejection fraction, 20%, global hypokinesis Unable to estimate right heart pressures  no significant change in echo compared to study June 2015  Feels well on today's visit No near syncope or syncope Denies alcohol abuse, drug abuse Compliance with his medication, caretaker presents with him today Does not feel abdominal bloating, leg swelling, PND orthopnea  Reports he is on Lasix 40 mg daily. This is added to his medication list  EKG personally reviewed by myself on today's visit showing normal sinus rhythm rate 85 bpm,  LVH   PMH:   has a past medical history of Chronic systolic CHF (congestive heart failure) ([place]), Diabetes mellitus with complication ([place]), Polysubstance abuse ([place]), and Stroke ([place]).  PSH:   History reviewed. No pertinent surgical history.  Current Outpatient Medications   Medication Sig Dispense Refill  . aspirin 81 MG chewable tablet Chew 1 tablet (81 mg total) by mouth daily. 30 tablet 1  . carvedilol (COREG) 3.125 MG tablet Take 1 tablet (3.125 mg total) by mouth 2 (two) times daily with a meal. 180 tablet 3  . cetirizine (ZYRTEC) 10 MG tablet Take 1 tablet (10 mg total) by mouth daily. 90 tablet 1  . folic acid (FOLVITE) 1 MG tablet Take 1 tablet (1 mg total) by mouth daily. 30 tablet 1  . insulin aspart (NOVOLOG) 100 UNIT/ML injection Inject 5 Units into the skin 3 (three) times daily with meals.    . insulin glargine (LANTUS) 100 UNIT/ML injection Inject 15 Units into the skin at bedtime.    Marland Kitchen losartan (COZAAR) 25 MG tablet Take 1 tablet (25 mg total) by mouth daily. 90 tablet 3  . Multiple Vitamin (MULTIVITAMIN WITH MINERALS) TABS tablet Take 1 tablet by mouth daily. 30 tablet 1  . thiamine 100 MG tablet Take 1 tablet (100 mg total) by mouth daily. 14 tablet 0  . furosemide (LASIX) 40 MG tablet Take 1 tablet (40 mg total) by mouth daily. 90 tablet 3   No current facility-administered medications for this visit.      Allergies:   Patient has no known allergies.   Social History:  The patient  reports that he has been smoking.  He has never used smokeless tobacco. He reports that he drank alcohol.   Family History:   family history includes Congestive Heart Failure in his brother and mother; Diabetes in his mother.    Review of Systems: Review of Systems  Constitutional: Negative.   Respiratory: Negative.   Cardiovascular: Negative.   Gastrointestinal: Negative.   Musculoskeletal: Negative.        Weak legs  Neurological: Negative.   Psychiatric/Behavioral: Negative.   All other systems reviewed and are negative.    PHYSICAL EXAM: VS:  BP 134/78 (BP Location: Left Arm, Patient Position: Sitting, Cuff Size: Normal)   Pulse 85   Ht 5\' 11"  (1.803 m)   Wt 129 lb 8 oz (58.7 kg)   BMI 18.06 kg/m  , BMI Body mass index is 18.06 kg/m. GEN:   thin,   in no acute distress , presenting a wheelchair Constitutional:  oriented to person, place, and time. No distress.  HENT:  Head: Normocephalic and atraumatic.  Eyes:  no discharge. No scleral icterus.  Neck: Normal range of motion. Neck supple. No JVD present.  Cardiovascular: Normal rate, regular rhythm, normal heart sounds and intact distal pulses. Exam reveals no gallop and no friction rub. No edema No murmur heard. Pulmonary/Chest: Effort normal and breath sounds normal. No stridor. No respiratory distress.  no wheezes.  no rales.  no tenderness.  Abdominal: Soft.  no distension.  no tenderness.  Musculoskeletal: Normal range of motion.  no  tenderness or deformity.  Neurological:  normal muscle tone. Coordination normal. No atrophy Skin: Skin is warm and dry. No rash noted. not diaphoretic.  Psychiatric:  normal mood and affect. behavior is normal. Thought content normal.     Recent Labs: 04/27/2017: ALT 30 04/28/2017: Magnesium 2.5 04/29/2017: B Natriuretic Peptide 317.0; BUN 18; Creatinine, Ser 0.64; Hemoglobin 13.2; Platelets 216; Potassium 3.8; Sodium 136; TSH 1.705    Lipid Panel Lab Results  Component Value Date   CHOL 152 06/18/2013   HDL 80 (H) 06/18/2013   LDLCALC 59 06/18/2013   TRIG 67 06/18/2013      Wt Readings from Last 3 Encounters:  06/16/17 129 lb 8 oz (58.7 kg)  05/13/17 126 lb 8 oz (57.4 kg)  04/28/17 123 lb 14.4 oz (56.2 kg)       ASSESSMENT AND PLAN:  Nonischemic cardiomyopathy Prior catheterization June 2015, no further ischemic work-up needed Recommended he increase carvedilol up to 6.25 mill grams twice a day Stop the losartan start Entresto 49/51 mg twice a day add spironolactone at a later date lAb work in one month LFTs and BMP  Type 2 diabetes mellitus with complication, with long-term current use of insulin ([place]) -  long history of poorly control diabetes, managed by the [facility] Often eating the wrong foods at home per the  caretaker, chocolate cake etc.  Smoker We have encouraged him to continue to work on weaning his cigarettes and smoking cessation. He will continue to work on this and does not want any assistance with chantix. Still smoking on today's visit  Polysubstance abuse ([place]) Prior history of alcohol, reports that he had cut back Recommended complete alcohol cessation given his cardiomyopathy   Total encounter time more than 25 minutes  Greater than 50% was spent in counseling and coordination of care with the patient   Orders Placed This Encounter  Procedures  . EKG 12-Lead     Signed, Esmond Plants, M.D., Ph.D. 06/16/2017  [place], [place]

## 2017-06-16 ENCOUNTER — Ambulatory Visit (INDEPENDENT_AMBULATORY_CARE_PROVIDER_SITE_OTHER): Payer: Medicaid Other | Admitting: Cardiovascular Disease

## 2017-06-16 ENCOUNTER — Encounter: Payer: Self-pay | Admitting: Cardiovascular Disease

## 2017-06-16 VITALS — BP 134/78 | HR 85 | Ht 71.0 in | Wt 129.5 lb

## 2017-06-16 DIAGNOSIS — F17200 Nicotine dependence, unspecified, uncomplicated: Secondary | ICD-10-CM

## 2017-06-16 DIAGNOSIS — F1010 Alcohol abuse, uncomplicated: Secondary | ICD-10-CM

## 2017-06-16 DIAGNOSIS — F1910 Other psychoactive substance abuse, uncomplicated: Secondary | ICD-10-CM

## 2017-06-16 DIAGNOSIS — E1110 Type 2 diabetes mellitus with ketoacidosis without coma: Secondary | ICD-10-CM

## 2017-06-16 DIAGNOSIS — E118 Type 2 diabetes mellitus with unspecified complications: Secondary | ICD-10-CM

## 2017-06-16 DIAGNOSIS — I428 Other cardiomyopathies: Secondary | ICD-10-CM | POA: Diagnosis not present

## 2017-06-16 DIAGNOSIS — Z794 Long term (current) use of insulin: Secondary | ICD-10-CM

## 2017-06-16 MED ORDER — SACUBITRIL-VALSARTAN 49-51 MG PO TABS: 1.0000 | ORAL_TABLET | Freq: Two times a day (BID) | ORAL | 5 refills | Status: DC

## 2017-06-16 MED ORDER — CARVEDILOL 6.25 MG PO TABS: 6.2500 mg | ORAL_TABLET | Freq: Two times a day (BID) | ORAL | 3 refills | Status: DC

## 2017-06-16 NOTE — Patient Instructions (Addendum)
Medication Instructions:   Please increase the coreg up to 6.25 mg twice a day  Hold the losartan  Start entresto 49/51 mg twice a day  Medication Samples have been provided to the patient.  Drug name: [name]       Strength: 49/51 mg        Qty: 1 box  LOT: [id number]  Exp.Date: 1/21   Labwork:  [facility]: LFTS and BMP in one month  Testing/Procedures:  No further testing at this time   Follow-Up: It was a pleasure seeing you in the office today. Please call us if you have new issues that need to be addressed before your next appt.  [phone]  Your physician wants you to follow-up in: 3 months.  You will receive a reminder letter in the mail two months in advance. If you don't receive a letter, please call our office to schedule the follow-up appointment.  If you need a refill on your cardiac medications before your next appointment, please call your pharmacy.  For educational health videos Log in to : [email or web address] Or : [email or web address], password : [email or web address]

## 2017-06-17 ENCOUNTER — Telehealth: Payer: Self-pay | Admitting: *Deleted

## 2017-06-17 NOTE — Telephone Encounter (Signed)
Pt requiring PA for Entresto 49/51 mg tablet. PA has been submitted through [place]. PA is currently under review and may take up to 24 hours to process.  Tel: [phone] ID# [id number] Review # [id number]

## 2017-06-20 ENCOUNTER — Telehealth: Payer: Self-pay | Admitting: Cardiovascular Disease

## 2017-06-20 NOTE — Telephone Encounter (Signed)
Called to initate a PA for the patient's entresto based on a fax we received from [employer]. I spoke with an [employer] [place] rep at [phone] and they informed me that a PA was started on 06/17/17. I did review the patient's chart further and saw where this had been started. They were able to tell me today that the PA had been approved for the patient's entreso 49-51 mg tablets- good from 06/17/17-06/12/18.  I have called and spoken with [place] and they are aware.

## 2017-06-22 NOTE — Telephone Encounter (Signed)
Pt approved for Entresto 49-51 mg tablet.  06/17/17-06/12/18

## 2017-06-29 ENCOUNTER — Telehealth: Payer: Self-pay | Admitting: Cardiovascular Disease

## 2017-06-29 ENCOUNTER — Other Ambulatory Visit: Payer: Self-pay | Admitting: Cardiovascular Disease

## 2017-06-29 NOTE — Telephone Encounter (Signed)
Please review for refill. Thanks!  

## 2017-06-29 NOTE — Telephone Encounter (Signed)
°*  STAT* If patient is at the pharmacy, call can be transferred to refill team.   1. Which medications need to be refilled? (please list name of each medication and dose if known)       Carvedilol 1.79 mg po BID    Folic Acid 1 mg po q d     Multi vitamin 1 tab po q d     Baclofen 10 mg po PID prn for spasms   2. Which pharmacy/location (including street and city if local pharmacy) is medication to be sent to?     [facility]   3. Do they need a 30 day or 90 day supply? [place]

## 2017-06-29 NOTE — Telephone Encounter (Signed)
Carvedilol was refilled 06/16/2017 Please advise about the other 3 if we are to refill them or if PCP should.  Thanks!   carvedilol (COREG) 6.25 MG tablet 180 tablet 3 06/16/2017    Sig - Route: Take 1 tablet (6.25 mg total) by mouth 2 (two) times daily with a meal. - Oral   Sent to pharmacy as: carvedilol (COREG) 6.25 MG tablet   E-Prescribing Status: Receipt confirmed by pharmacy (06/16/2017 9:49 AM EDT)   [place], [place]

## 2017-06-29 NOTE — Telephone Encounter (Signed)
Spoke with Shawn [name] and made her aware that carvedilol was refilled on 06/013/2019 180 tabs with 3 refills and that the Folic acid, Multi vitamin, and baclofen would have to be refilled by Primary Care.   She was very appreciative of the call.

## 2017-07-14 ENCOUNTER — Other Ambulatory Visit: Payer: Self-pay | Admitting: Cardiovascular Disease

## 2017-07-14 DIAGNOSIS — I428 Other cardiomyopathies: Secondary | ICD-10-CM | POA: Diagnosis not present

## 2017-07-15 ENCOUNTER — Telehealth: Payer: Self-pay | Admitting: Internal Medicine

## 2017-07-15 NOTE — Telephone Encounter (Signed)
Received report that glucose > 500 from 10 AM yesterday.  Will forward to ordering team Zorita Pang, MD Cardiology .

## 2017-07-16 NOTE — Telephone Encounter (Signed)
Needs to establish with local PMD, And follow up with the [facility]

## 2017-07-18 NOTE — Telephone Encounter (Signed)
Called and s/w patient's wife, ok per DPR. Could not find lab work in [employer], academic.  Wife said patient got lab work at [facility] on 07/14/17.  She also said they had a death in the family last week.  Patient's blood sugars yesterday have been wellcontrolled at 130 and 119, She says the patient sometimes sneaks cookies and cakes and sweets.  She tries to tell him he should not do that and will continue to tell him that and have him f[facility] with [facility]CP or [facility]. She also says he complains his penis is sensitive. Advised her to have patient f/u with PCP or [facility] on that as well. She was very Patent attorney.   Called [facility] and they will fax over lab results.

## 2017-07-20 LAB — BASIC METABOLIC PANEL
BUN / CREAT RATIO: 10 (ref 10–24)
BUN: 8 mg/dL (ref 8–27)
CHLORIDE: 94 mmol/L — AB (ref 96–106)
CO2: 25 mmol/L (ref 20–29)
CREATININE: 0.83 mg/dL (ref 0.76–1.27)
Calcium: 9.5 mg/dL (ref 8.6–10.2)
GFR, EST AFRICAN AMERICAN: 110 mL/min/{1.73_m2} (ref >59)
GFR, EST NON AFRICAN AMERICAN: 95 mL/min/{1.73_m2} (ref >59)
Glucose: 600 mg/dL (ref 65–99)
Potassium: 4.4 mmol/L (ref 3.5–5.2)
Sodium: 136 mmol/L (ref 134–144)

## 2017-07-20 LAB — HEPATIC FUNCTION PANEL
ALK PHOS: 112 IU/L (ref 39–117)
ALT: 12 IU/L (ref 0–44)
AST: 18 IU/L (ref 0–40)
Albumin: 4 g/dL (ref 3.6–4.8)
BILIRUBIN, DIRECT: 0.11 mg/dL (ref 0.00–0.40)
Bilirubin Total: 0.2 mg/dL (ref 0.0–1.2)
Total Protein: 6.3 g/dL (ref 6.0–8.5)

## 2017-07-27 NOTE — Telephone Encounter (Signed)
Lab work from 07/14/17 in chart and already addressed by Dr Rockey Situ. Nothing further needed at this time.

## 2017-07-28 ENCOUNTER — Other Ambulatory Visit: Payer: Self-pay

## 2017-07-28 ENCOUNTER — Telehealth: Payer: Self-pay | Admitting: Cardiovascular Disease

## 2017-07-28 MED ORDER — CARVEDILOL 6.25 MG PO TABS: 6.2500 mg | ORAL_TABLET | Freq: Two times a day (BID) | ORAL | 3 refills | Status: DC

## 2017-07-28 NOTE — Telephone Encounter (Signed)
Carvedilol Prescription Fax to [facility] with Attn: Dr. Spero Curb

## 2017-07-28 NOTE — Telephone Encounter (Signed)
Patient spouse calling asking If we can please send the prescription for Carvedilol  For [facility] fills the prescriptions  Please fax this to [phone] attn: Dr Spero Curb

## 2017-09-26 ENCOUNTER — Other Ambulatory Visit: Payer: Self-pay | Admitting: Cardiovascular Disease

## 2017-09-26 DIAGNOSIS — I428 Other cardiomyopathies: Secondary | ICD-10-CM

## 2017-09-26 MED ORDER — SACUBITRIL-VALSARTAN 49-51 MG PO TABS: 1.0000 | ORAL_TABLET | Freq: Two times a day (BID) | ORAL | 5 refills | Status: DC

## 2017-09-26 NOTE — Telephone Encounter (Signed)
Refill sent for Entresto 49-51 mg

## 2017-09-26 NOTE — Telephone Encounter (Signed)
°*  STAT* If patient is at the pharmacy, call can be transferred to refill team.   1. Which medications need to be refilled? (please list name of each medication and dose if known) Entresto   2. Which pharmacy/location (including street and city if local pharmacy) is medication to be sent to? [facility]    3. Do they need a 30 day or 90 day supply? 30 day

## 2017-10-27 ENCOUNTER — Other Ambulatory Visit: Payer: Self-pay | Admitting: Cardiovascular Disease

## 2017-12-03 ENCOUNTER — Emergency Department
Admission: EM | Admit: 2017-12-03 | Discharge: 2017-12-03 | Disposition: A | Discharge status: Home / Self Care | Payer: Medicaid Other | Attending: Emergency Medicine | Admitting: Emergency Medicine

## 2017-12-03 ENCOUNTER — Other Ambulatory Visit: Payer: Self-pay

## 2017-12-03 ENCOUNTER — Encounter: Payer: Self-pay | Admitting: Emergency Medicine

## 2017-12-03 DIAGNOSIS — I5022 Chronic systolic (congestive) heart failure: Secondary | ICD-10-CM | POA: Diagnosis not present

## 2017-12-03 DIAGNOSIS — Z79899 Other long term (current) drug therapy: Secondary | ICD-10-CM | POA: Insufficient documentation

## 2017-12-03 DIAGNOSIS — R609 Edema, unspecified: Secondary | ICD-10-CM | POA: Insufficient documentation

## 2017-12-03 DIAGNOSIS — Z8673 Personal history of transient ischemic attack (TIA), and cerebral infarction without residual deficits: Secondary | ICD-10-CM | POA: Insufficient documentation

## 2017-12-03 DIAGNOSIS — E119 Type 2 diabetes mellitus without complications: Secondary | ICD-10-CM | POA: Diagnosis not present

## 2017-12-03 DIAGNOSIS — R600 Localized edema: Secondary | ICD-10-CM | POA: Diagnosis not present

## 2017-12-03 DIAGNOSIS — Z7982 Long term (current) use of aspirin: Secondary | ICD-10-CM | POA: Diagnosis not present

## 2017-12-03 DIAGNOSIS — Z794 Long term (current) use of insulin: Secondary | ICD-10-CM | POA: Diagnosis not present

## 2017-12-03 DIAGNOSIS — N4829 Other inflammatory disorders of penis: Secondary | ICD-10-CM | POA: Diagnosis present

## 2017-12-03 DIAGNOSIS — F17200 Nicotine dependence, unspecified, uncomplicated: Secondary | ICD-10-CM | POA: Diagnosis not present

## 2017-12-03 MED ORDER — POTASSIUM CHLORIDE ER 10 MEQ PO TBCR: 10.0000 meq | EXTENDED_RELEASE_TABLET | Freq: Every day | ORAL | 0 refills | Status: DC

## 2017-12-03 MED ORDER — FUROSEMIDE 40 MG PO TABS: 40.0000 mg | ORAL_TABLET | Freq: Two times a day (BID) | ORAL | 0 refills | Status: DC

## 2017-12-03 NOTE — Discharge Instructions (Addendum)
You are evaluated for swelling of the penis, and were found to have fluid buildup in the legs as well as the groin, what we call peripheral edema.  I suspect the cause of this is congestive heart failure exacerbation.  As we discussed, I want you to take 40 mg of furosemide twice a day (increased from once per day) for 5 days.  I am adding a potassium supplement while you are on the increased dose of the fluid pill.  Return to the ER for any trouble breathing or shortness of breath, skin rash, redness, any pain around the penis or scrotum or testicles, or any other symptoms concerning to you.

## 2017-12-03 NOTE — ED Provider Notes (Signed)
[facility] Emergency Department Provider Note ____________________________________________   I have reviewed the triage vital signs and the triage nursing note.  HISTORY  Chief Complaint Groin Swelling   Historian Patient  HPI Shawn [name] is a 62 y.o. male with history of CHF, on Lasix 40 mg daily, presents for about 1 to 2 weeks of penis skin swelling.  It is nontender.  He has no groin pain.  He has not noticed leg edema.  No skin rash.  No penile discharge.  No abdominal pain.  No shortness breath or trouble breathing or chest pain.  Symptoms are mild to moderate.  No urinary symptoms.     Past Medical History:  Diagnosis Date  . Chronic systolic CHF (congestive heart failure) ([place])    a. TTE 6/15: EF < 20%, mildly dilated LV, DD, mildly dilated LA, mod dilated RA, mild MR, mild to mod TR, mod increased posterior wall thickness, elevated LA and LVEDP  . Diabetes mellitus with complication ([place])   . Polysubstance abuse ([place])    a. etoh and tobacco  . Stroke [facility])     Patient Active Problem List   Diagnosis Date Noted  . NICM (nonischemic cardiomyopathy) ([place]) 05/13/2017  . Type 2 diabetes mellitus with complication, with long-term current use of insulin ([place]) 05/13/2017  . Smoker 05/13/2017  . Polysubstance abuse ([place]) 05/13/2017  . Alcohol abuse 05/13/2017  . DKA (diabetic ketoacidoses) ([employer]) 04/27/2017    History reviewed. No pertinent surgical history.  Prior to Admission medications   Medication Sig Start Date End Date Taking? Authorizing Provider  aspirin 81 MG chewable tablet Chew 1 tablet (81 mg total) by mouth daily. 04/30/17   Demetrios Loll, MD  carvedilol (COREG) 6.25 MG tablet Take 1 tablet (6.25 mg total) by mouth 2 (two) times daily with a meal. 07/28/17   Gollan, Kathlene November, MD  cetirizine (ZYRTEC) 10 MG tablet TAKE 1 TABLET BY MOUTH DAILY 10/27/17   Minna Merritts, MD  folic acid (FOLVITE) 1 MG tablet Take 1 tablet (1  mg total) by mouth daily. 04/30/17   Demetrios Loll, MD  furosemide (LASIX) 40 MG tablet Take 1 tablet (40 mg total) by mouth daily. 06/16/17   Minna Merritts, MD  furosemide (LASIX) 40 MG tablet Take 1 tablet (40 mg total) by mouth 2 (two) times daily. 12/03/17 12/03/18  Lisa Roca, MD  insulin aspart (NOVOLOG) 100 UNIT/ML injection Inject 5 Units into the skin 3 (three) times daily with meals.    [provider]  insulin glargine (LANTUS) 100 UNIT/ML injection Inject 15 Units into the skin at bedtime.    [provider]  Multiple Vitamin (MULTIVITAMIN WITH MINERALS) TABS tablet Take 1 tablet by mouth daily. 04/30/17   Demetrios Loll, MD  potassium chloride (K-DUR) 10 MEQ tablet Take 1 tablet (10 mEq total) by mouth daily. 12/03/17   Lisa Roca, MD  sacubitril-valsartan (ENTRESTO) 49-51 MG Take 1 tablet by mouth 2 (two) times daily. 09/26/17   Minna Merritts, MD  thiamine 100 MG tablet Take 1 tablet (100 mg total) by mouth daily. 04/30/17   Demetrios Loll, MD    No Known Allergies  Family History  Problem Relation Age of Onset  . Congestive Heart Failure Mother   . Diabetes Mother   . Congestive Heart Failure Brother     Social History Social History   Tobacco Use  . Smoking status: Current Every Day Smoker  . Smokeless tobacco: Never Used  Substance  Use Topics  . Alcohol use: Not Currently    Frequency: Never  . Drug use: Not on file    Review of Systems  Constitutional: Negative for fever. Eyes: Negative for visual changes. ENT: Negative for sore throat. Cardiovascular: Negative for chest pain. Respiratory: Negative for shortness of breath. Gastrointestinal: Negative for abdominal pain, vomiting and diarrhea. Genitourinary: Negative for dysuria. Musculoskeletal: Negative for back pain. Skin: Negative for rash. Neurological: Negative for headache.  ____________________________________________   PHYSICAL EXAM:  VITAL SIGNS: ED Triage Vitals [12/03/17  0929]  Enc Vitals Group     BP (!) 180/99     Pulse Rate 80     Resp 20     Temp 98.5 F (36.9 C)     Temp Source Oral     SpO2 99 %     Weight 141 lb (64 kg)     Height 5\' 11"  (1.803 m)     Head Circumference      Peak Flow      Pain Score 0     Pain Loc      Pain Edu?      Excl. in [place]?      Constitutional: Alert and oriented.  HEENT      Head: Normocephalic and atraumatic.      Eyes: Conjunctivae are normal. Pupils equal and round.       Ears:         Nose: No congestion/rhinnorhea.      Mouth/Throat: Mucous membranes are moist.      Neck: No stridor. Cardiovascular/Chest: Normal rate, regular rhythm.  No murmurs, rubs, or gallops. Respiratory: Normal respiratory effort without tachypnea nor retractions. Breath sounds are clear and equal bilaterally. No wheezes/rales/rhonchi. Gastrointestinal: Soft. No distention, no guarding, no rebound. Nontender.    Genitourinary/rectal: No hernia.  No redness or swelling or tenderness.  No testicular pain.  Edema of the penis shaft.   Musculoskeletal: Nontender with normal range of motion in all extremities. No joint effusions.  No lower extremity tenderness.  1+ lower edema to the left lower extremity, 2+ lower extremity edema to the right lower extremity. Neurologic:  Normal speech and language. No gross or focal neurologic deficits are appreciated. Skin:  Skin is warm, dry and intact. No rash noted. Psychiatric: Mood and affect are normal. Speech and behavior are normal. Patient exhibits appropriate insight and judgment.   ____________________________________________  LABS (pertinent positives/negatives) I, Lisa Roca, MD the attending physician have reviewed the labs noted below.  Labs Reviewed - No data to display  ____________________________________________    EKG I, Lisa Roca, MD, the attending physician have personally viewed and interpreted all  ECGs.  None ____________________________________________  RADIOLOGY   None __________________________________________  PROCEDURES  Procedure(s) performed: None  Procedures  Critical Care performed: None   ____________________________________________  ED COURSE / ASSESSMENT AND PLAN  Pertinent labs & imaging results that were available during my care of the patient were reviewed by me and considered in my medical decision making (see chart for details).     No evidence of balanitis, cellulitis, Fournier's gangrene, hernia, testicular pain or swelling.  Patient actually does have pitting edema to the lower extremities, and edema to the skin of the penis.  As we discussed, I think this is due to CHF exacerbation.  He is not having any indication for need for hospitalization or any additional work-up as his lungs are clear.  We discussed increasing his Lasix for about 5 days and I will also  add a potassium supplement.  He can follow with his primary care doctor.    CONSULTATIONS: None   Patient / Family / Caregiver informed of clinical course, medical decision-making process, and agree with plan.   I discussed return precautions, follow-up instructions, and discharge instructions with patient and/or family.  Discharge Instructions : You are evaluated for swelling of the penis, and were found to have fluid buildup in the legs as well as the groin, what we call peripheral edema.  I suspect the cause of this is congestive heart failure exacerbation.  As we discussed, I want you to take 40 mg of furosemide twice a day (increased from once per day) for 5 days.  I am adding a potassium supplement while you are on the increased dose of the fluid pill.  Return to the ER for any trouble breathing or shortness of breath, skin rash, redness, any pain around the penis or scrotum or testicles, or any other symptoms concerning to  you.   ___________________________________________   FINAL CLINICAL IMPRESSION(S) / ED DIAGNOSES   Final diagnoses:  Edema, peripheral      ___________________________________________         Note: This dictation was prepared with Dragon dictation. Any transcriptional errors that result from this process are unintentional    Lisa Roca, MD 12/03/17 1053

## 2017-12-03 NOTE — ED Triage Notes (Signed)
Patient states he has had penile swelling intermittently 3 times this week. States he has h/o CHF and frequently has leg swelling. No leg swelling reported now.

## 2017-12-15 ENCOUNTER — Inpatient Hospital Stay
Admission: EM | Admit: 2017-12-15 | Discharge: 2017-12-17 | DRG: 638 | Disposition: A | Discharge status: Home / Self Care | Payer: Medicaid Other | Attending: Internal Medicine | Admitting: Internal Medicine

## 2017-12-15 ENCOUNTER — Emergency Department: Payer: Medicaid Other

## 2017-12-15 DIAGNOSIS — Z794 Long term (current) use of insulin: Secondary | ICD-10-CM | POA: Diagnosis not present

## 2017-12-15 DIAGNOSIS — E875 Hyperkalemia: Secondary | ICD-10-CM | POA: Diagnosis present

## 2017-12-15 DIAGNOSIS — Z9114 Patient's other noncompliance with medication regimen: Secondary | ICD-10-CM

## 2017-12-15 DIAGNOSIS — N179 Acute kidney failure, unspecified: Secondary | ICD-10-CM | POA: Diagnosis present

## 2017-12-15 DIAGNOSIS — Z79899 Other long term (current) drug therapy: Secondary | ICD-10-CM

## 2017-12-15 DIAGNOSIS — I5042 Chronic combined systolic (congestive) and diastolic (congestive) heart failure: Secondary | ICD-10-CM | POA: Diagnosis present

## 2017-12-15 DIAGNOSIS — Z833 Family history of diabetes mellitus: Secondary | ICD-10-CM

## 2017-12-15 DIAGNOSIS — Z8249 Family history of ischemic heart disease and other diseases of the circulatory system: Secondary | ICD-10-CM

## 2017-12-15 DIAGNOSIS — Z7982 Long term (current) use of aspirin: Secondary | ICD-10-CM

## 2017-12-15 DIAGNOSIS — E871 Hypo-osmolality and hyponatremia: Secondary | ICD-10-CM | POA: Diagnosis present

## 2017-12-15 DIAGNOSIS — I428 Other cardiomyopathies: Secondary | ICD-10-CM | POA: Diagnosis present

## 2017-12-15 DIAGNOSIS — F17210 Nicotine dependence, cigarettes, uncomplicated: Secondary | ICD-10-CM | POA: Diagnosis present

## 2017-12-15 DIAGNOSIS — F1010 Alcohol abuse, uncomplicated: Secondary | ICD-10-CM | POA: Diagnosis present

## 2017-12-15 DIAGNOSIS — E1110 Type 2 diabetes mellitus with ketoacidosis without coma: Secondary | ICD-10-CM | POA: Diagnosis present

## 2017-12-15 DIAGNOSIS — Z23 Encounter for immunization: Secondary | ICD-10-CM

## 2017-12-15 DIAGNOSIS — Z8673 Personal history of transient ischemic attack (TIA), and cerebral infarction without residual deficits: Secondary | ICD-10-CM

## 2017-12-15 DIAGNOSIS — I110 Hypertensive heart disease with heart failure: Secondary | ICD-10-CM | POA: Diagnosis present

## 2017-12-15 DIAGNOSIS — I5022 Chronic systolic (congestive) heart failure: Secondary | ICD-10-CM | POA: Diagnosis present

## 2017-12-15 DIAGNOSIS — E0810 Diabetes mellitus due to underlying condition with ketoacidosis without coma: Secondary | ICD-10-CM

## 2017-12-15 DIAGNOSIS — I5023 Acute on chronic systolic (congestive) heart failure: Secondary | ICD-10-CM | POA: Diagnosis present

## 2017-12-15 LAB — COMPREHENSIVE METABOLIC PANEL
ALT: 23 U/L (ref 0–44)
ANION GAP: 34 — AB (ref 5–15)
AST: 26 U/L (ref 15–41)
Albumin: 3 g/dL — ABNORMAL LOW (ref 3.5–5.0)
Alkaline Phosphatase: 117 U/L (ref 38–126)
BUN: 92 mg/dL — ABNORMAL HIGH (ref 8–23)
CHLORIDE: 85 mmol/L — AB (ref 98–111)
CO2: 8 mmol/L — ABNORMAL LOW (ref 22–32)
Calcium: 8.6 mg/dL — ABNORMAL LOW (ref 8.9–10.3)
Creatinine, Ser: 2.81 mg/dL — ABNORMAL HIGH (ref 0.61–1.24)
GFR calc Af Amer: 27 mL/min — ABNORMAL LOW (ref >60)
GFR calc non Af Amer: 23 mL/min — ABNORMAL LOW (ref >60)
GLUCOSE: 1188 mg/dL — AB (ref 70–99)
Potassium: 6.8 mmol/L (ref 3.5–5.1)
SODIUM: 127 mmol/L — AB (ref 135–145)
TOTAL PROTEIN: 6.2 g/dL — AB (ref 6.5–8.1)
Total Bilirubin: 1.9 mg/dL — ABNORMAL HIGH (ref 0.3–1.2)

## 2017-12-15 LAB — CBC
HEMATOCRIT: 45.9 % (ref 39.0–52.0)
Hemoglobin: 14.1 g/dL (ref 13.0–17.0)
MCH: 30.5 pg (ref 26.0–34.0)
MCHC: 30.7 g/dL (ref 30.0–36.0)
MCV: 99.4 fL (ref 80.0–100.0)
Platelets: 272 10*3/uL (ref 150–400)
RBC: 4.62 MIL/uL (ref 4.22–5.81)
RDW: 13.2 % (ref 11.5–15.5)
WBC: 12.6 10*3/uL — ABNORMAL HIGH (ref 4.0–10.5)
nRBC: 0 % (ref 0.0–0.2)

## 2017-12-15 LAB — GLUCOSE, CAPILLARY

## 2017-12-15 LAB — BLOOD GAS, VENOUS
ACID-BASE DEFICIT: 20.5 mmol/L — AB (ref 0.0–2.0)
Bicarbonate: 7.3 mmol/L — ABNORMAL LOW (ref 20.0–28.0)
O2 Saturation: 98 %
PCO2 VEN: 23 mmHg — AB (ref 44.0–60.0)
PH VEN: 7.11 — AB (ref 7.250–7.430)
Patient temperature: 37
pO2, Ven: 135 mmHg — ABNORMAL HIGH (ref 32.0–45.0)

## 2017-12-15 LAB — BETA-HYDROXYBUTYRIC ACID: Beta-Hydroxybutyric Acid: >8 mmol/L — ABNORMAL HIGH (ref 0.05–0.27)

## 2017-12-15 MED ORDER — INSULIN ASPART 100 UNIT/ML ~~LOC~~ SOLN
10.0000 [IU] | Freq: Once | SUBCUTANEOUS | Status: AC
  Administered 2017-12-15: 10 [IU] via INTRAVENOUS
  Filled 2017-12-15: qty 1

## 2017-12-15 MED ORDER — SODIUM CHLORIDE 0.9 % IV SOLN
INTRAVENOUS | Status: DC
  Administered 2017-12-16 (×2): via INTRAVENOUS

## 2017-12-15 MED ORDER — SODIUM CHLORIDE 0.9 % IV SOLN
1.0000 g | Freq: Once | INTRAVENOUS | Status: AC
  Administered 2017-12-15: 1 g via INTRAVENOUS
  Filled 2017-12-15: qty 10

## 2017-12-15 MED ORDER — INSULIN REGULAR(HUMAN) IN NACL 100-0.9 UT/100ML-% IV SOLN
INTRAVENOUS | Status: DC
  Administered 2017-12-15: 5.4 [IU]/h via INTRAVENOUS
  Filled 2017-12-15: qty 100

## 2017-12-15 MED ORDER — ENOXAPARIN SODIUM 40 MG/0.4ML ~~LOC~~ SOLN: 40.0000 mg | SUBCUTANEOUS | Status: DC

## 2017-12-15 MED ORDER — INSULIN REGULAR(HUMAN) IN NACL 100-0.9 UT/100ML-% IV SOLN
INTRAVENOUS | Status: DC
  Administered 2017-12-16: 10.8 [IU]/h via INTRAVENOUS
  Administered 2017-12-16: 8.6 [IU]/h via INTRAVENOUS
  Administered 2017-12-16: 21.6 [IU]/h via INTRAVENOUS
  Filled 2017-12-15 (×2): qty 100

## 2017-12-15 MED ORDER — SODIUM ZIRCONIUM CYCLOSILICATE 10 G PO PACK
10.0000 g | PACK | Freq: Once | ORAL | Status: AC
  Administered 2017-12-15: 10 g via ORAL
  Filled 2017-12-15 (×2): qty 1

## 2017-12-15 MED ORDER — SODIUM CHLORIDE 0.9 % IV SOLN: INTRAVENOUS | Status: AC

## 2017-12-15 MED ORDER — SODIUM CHLORIDE 0.9 % IV SOLN
Freq: Once | INTRAVENOUS | Status: AC
  Administered 2017-12-15: 23:00:00 via INTRAVENOUS

## 2017-12-15 MED ORDER — SODIUM CHLORIDE 0.9 % IV SOLN
Freq: Once | INTRAVENOUS | Status: AC
  Administered 2017-12-15: 21:00:00 via INTRAVENOUS

## 2017-12-15 MED ORDER — SODIUM BICARBONATE 8.4 % IV SOLN
50.0000 meq | Freq: Once | INTRAVENOUS | Status: AC
  Administered 2017-12-15: 50 meq via INTRAVENOUS
  Filled 2017-12-15: qty 50

## 2017-12-15 MED ORDER — DEXTROSE-NACL 5-0.45 % IV SOLN
INTRAVENOUS | Status: DC
  Administered 2017-12-16 – 2017-12-17 (×3): via INTRAVENOUS

## 2017-12-15 NOTE — ED Provider Notes (Signed)
[facility] Emergency Department Provider Note       Time seen: ----------------------------------------- 8:49 PM on 12/15/2017 -----------------------------------------   I have reviewed the triage vital signs and the nursing notes.  HISTORY   Chief Complaint Weakness and Hyperglycemia    HPI Shawn [name] is a 62 y.o. male with a history of CHF, diabetes, substance abuse, CVA who presents to the ED for hyperglycemia.  Patient arrives from home reports he has not been taking his diabetes medications because he has been with his wife in the hospital.  He describes nausea, vomiting and diarrhea for the past 2 days.  His glucometer read critical high.  He has not had short or long-acting insulin in several days.  Past Medical History:  Diagnosis Date  . Chronic systolic CHF (congestive heart failure) ([place])    a. TTE 6/15: EF < 20%, mildly dilated LV, DD, mildly dilated LA, mod dilated RA, mild MR, mild to mod TR, mod increased posterior wall thickness, elevated LA and LVEDP  . Diabetes mellitus with complication ([place])   . Polysubstance abuse ([place])    a. etoh and tobacco  . Stroke [facility])     Patient Active Problem List   Diagnosis Date Noted  . NICM (nonischemic cardiomyopathy) ([place]) 05/13/2017  . Type 2 diabetes mellitus with complication, with long-term current use of insulin ([place]) 05/13/2017  . Smoker 05/13/2017  . Polysubstance abuse ([place]) 05/13/2017  . Alcohol abuse 05/13/2017  . DKA (diabetic ketoacidoses) ([place]) 04/27/2017    History reviewed. No pertinent surgical history.  Allergies Patient has no known allergies.  Social History Social History   Tobacco Use  . Smoking status: Current Every Day Smoker  . Smokeless tobacco: Never Used  Substance Use Topics  . Alcohol use: Not Currently    Frequency: Never  . Drug use: Not on file   Review of Systems Constitutional: Negative for fever. Eyes: Positive for vision changes ENT:   Negative for congestion, sore throat Cardiovascular: Negative for chest pain. Respiratory: Negative for shortness of breath. Gastrointestinal: Negative for abdominal pain, positive for vomiting and diarrhea Musculoskeletal: Negative for back pain. Skin: Negative for rash. Neurological: Negative for headaches, positive for weakness  All systems negative/normal/unremarkable except as stated in the HPI  ____________________________________________   PHYSICAL EXAM:  VITAL SIGNS: ED Triage Vitals  Enc Vitals Group     BP 12/15/17 2020 (!) 84/50     Pulse Rate 12/15/17 2020 80     Resp 12/15/17 2020 16     Temp 12/15/17 2020 97.6 F (36.4 C)     Temp Source 12/15/17 2020 Oral     SpO2 12/15/17 2020 98 %     Weight 12/15/17 2020 110 lb (49.9 kg)     Height 12/15/17 2020 5\' 10"  (1.778 m)     Head Circumference --      Peak Flow --      Pain Score 12/15/17 2027 0     Pain Loc --      Pain Edu? --      Excl. in [place]? --    Constitutional: Alert and oriented.  Moderate distress, ill-appearing Eyes: Conjunctivae are normal. Normal extraocular movements. ENT   Head: Normocephalic and atraumatic.   Nose: No congestion/rhinnorhea.   Mouth/Throat: Mucous membranes are moist.   Neck: No stridor. Cardiovascular: Normal rate, regular rhythm. No murmurs, rubs, or gallops. Respiratory: Normal respiratory effort without tachypnea nor retractions. Breath sounds are clear and equal bilaterally. No wheezes/rales/rhonchi. Gastrointestinal:  Soft and nontender. Normal bowel sounds Musculoskeletal: Nontender with normal range of motion in extremities.  Mild edema is noted Neurologic:  Normal speech and language. No gross focal neurologic deficits are appreciated.  Skin:  Skin is warm, dry and intact. No rash noted. Psychiatric: Mood and affect are normal. Speech and behavior are normal.  ____________________________________________  EKG: Interpreted by me.  Sinus rhythm with a rate of  79 bpm, nonspecific ST segment changes, normal axis  ____________________________________________  ED COURSE:  As part of my medical decision making, I reviewed the following data within the [place] History obtained from family if available, nursing notes, old chart and ekg, as well as notes from prior ED visits. Patient presented for hyperglycemia and weakness with possible DKA, we will assess with labs and imaging as indicated at this time.   Procedures ____________________________________________   LABS (pertinent positives/negatives)  Labs Reviewed  GLUCOSE, CAPILLARY - Abnormal; Notable for the following components:      Result Value   Glucose-Capillary >600 (*)    All other components within normal limits  CBC - Abnormal; Notable for the following components:   WBC 12.6 (*)    All other components within normal limits  COMPREHENSIVE METABOLIC PANEL - Abnormal; Notable for the following components:   Sodium 127 (*)    Potassium 6.8 (*)    Chloride 85 (*)    CO2 8 (*)    Glucose, Bld 1,188 (*)    BUN 92 (*)    Creatinine, Ser 2.81 (*)    Calcium 8.6 (*)    Total Protein 6.2 (*)    Albumin 3.0 (*)    Total Bilirubin 1.9 (*)    GFR calc non Af Amer 23 (*)    GFR calc Af Amer 27 (*)    Anion gap 34 (*)    All other components within normal limits  BLOOD GAS, VENOUS - Abnormal; Notable for the following components:   pH, Ven 7.11 (*)    pCO2, Ven 23 (*)    pO2, Ven 135.0 (*)    Bicarbonate 7.3 (*)    Acid-base deficit 20.5 (*)    All other components within normal limits  URINALYSIS, COMPLETE (UACMP) WITH MICROSCOPIC  BETA-HYDROXYBUTYRIC ACID  CBG MONITORING, ED   CRITICAL CARE Performed by: Laurence Aly   Total critical care time: 30 minutes  Critical care time was exclusive of separately billable procedures and treating other patients.  Critical care was necessary to treat or prevent imminent or life-threatening  deterioration.  Critical care was time spent personally by me on the following activities: development of treatment plan with patient and/or surrogate as well as nursing, discussions with consultants, evaluation of patient's response to treatment, examination of patient, obtaining history from patient or surrogate, ordering and performing treatments and interventions, ordering and review of laboratory studies, ordering and review of radiographic studies, pulse oximetry and re-evaluation of patient's condition.   RADIOLOGY Images were viewed by me  Chest x-ray Is unremarkable ____________________________________________  DIFFERENTIAL DIAGNOSIS   Dehydration, electrolyte abnormality, DKA, HH NK, sepsis  FINAL ASSESSMENT AND PLAN  DKA, acute renal failure, hyperkalemia, hyponatremia   Plan: The patient had presented for hyperglycemia and weakness secondary to medication noncompliance. Patient's labs did indicate severe DKA with acidemia and a blood sugar of almost 1200.  He also had hyperkalemia and hyponatremia associated with same. Patient's imaging did not reveal any acute process.  We started him on fluids as well as calcium,  insulin, Lokelma, and sodium bicarb.  He was placed on an insulin drip.  Currently he is awake and alert but remains in critical condition.   Laurence Aly, MD   Note: This note was generated in part or whole with voice recognition software. Voice recognition is usually quite accurate but there are transcription errors that can and very often do occur. I apologize for any typographical errors that were not detected and corrected.     Earleen Newport, MD 12/15/17 2145

## 2017-12-15 NOTE — H&P (Signed)
[facility] [place] NAME: Shawn [name]    MR#:  [id number]  DATE OF BIRTH:  27-Jul-1955  DATE OF ADMISSION:  12/15/2017  PRIMARY CARE PHYSICIAN: [place]   REQUESTING/REFERRING PHYSICIAN: Jimmye Norman, MD  CHIEF COMPLAINT:   Chief Complaint  Patient presents with  . Weakness  . Hyperglycemia    HISTORY OF PRESENT ILLNESS:  Shawn [name]  is a 62 y.o. male who presents with chief complaint as above.  Patient does not provide much information for HPI, but he does state that he has not had his insulin for the last several days.  He states that his wife was in the hospital and he was with her, and therefore did not take his insulin.  He presents today due to elevated glucose that he detected at home.  On arrival here in the ED his glucose is nearly 1200, with an anion gap of 34, and a pH of 7.1.  He is clearly in DKA and hospitalist were called for admission  PAST MEDICAL HISTORY:   Past Medical History:  Diagnosis Date  . Chronic systolic CHF (congestive heart failure) ([place])    a. TTE 6/15: EF < 20%, mildly dilated LV, DD, mildly dilated LA, mod dilated RA, mild MR, mild to mod TR, mod increased posterior wall thickness, elevated LA and LVEDP  . Diabetes mellitus with complication ([place])   . Polysubstance abuse ([place])    a. etoh and tobacco  . Stroke ([place])      PAST SURGICAL HISTORY:   Past Surgical History:  Procedure Laterality Date  . NO PAST SURGERIES       SOCIAL HISTORY:   Social History   Tobacco Use  . Smoking status: Current Every Day Smoker  . Smokeless tobacco: Never Used  Substance Use Topics  . Alcohol use: Not Currently    Frequency: Never     FAMILY HISTORY:   Family History  Problem Relation Age of Onset  . Congestive Heart Failure Mother   . Diabetes Mother   . Congestive Heart Failure Brother      DRUG ALLERGIES:  No Known Allergies  MEDICATIONS AT HOME:   Prior  to Admission medications   Medication Sig Start Date End Date Taking? Authorizing Provider  aspirin 81 MG chewable tablet Chew 1 tablet (81 mg total) by mouth daily. 04/30/17   Demetrios Loll, MD  carvedilol (COREG) 6.25 MG tablet Take 1 tablet (6.25 mg total) by mouth 2 (two) times daily with a meal. 07/28/17   Gollan, Kathlene November, MD  cetirizine (ZYRTEC) 10 MG tablet TAKE 1 TABLET BY MOUTH DAILY 10/27/17   Minna Merritts, MD  folic acid (FOLVITE) 1 MG tablet Take 1 tablet (1 mg total) by mouth daily. 04/30/17   Demetrios Loll, MD  furosemide (LASIX) 40 MG tablet Take 1 tablet (40 mg total) by mouth daily. 06/16/17   Minna Merritts, MD  furosemide (LASIX) 40 MG tablet Take 1 tablet (40 mg total) by mouth 2 (two) times daily. 12/03/17 12/03/18  Lisa Roca, MD  insulin aspart (NOVOLOG) 100 UNIT/ML injection Inject 5 Units into the skin 3 (three) times daily with meals.    [provider]  insulin glargine (LANTUS) 100 UNIT/ML injection Inject 15 Units into the skin at bedtime.    [provider]  Multiple Vitamin (MULTIVITAMIN WITH MINERALS) TABS tablet Take 1 tablet by mouth daily. 04/30/17   Demetrios Loll, MD  potassium chloride (  K-DUR) 10 MEQ tablet Take 1 tablet (10 mEq total) by mouth daily. 12/03/17   Lisa Roca, MD  sacubitril-valsartan (ENTRESTO) 49-51 MG Take 1 tablet by mouth 2 (two) times daily. 09/26/17   Minna Merritts, MD  thiamine 100 MG tablet Take 1 tablet (100 mg total) by mouth daily. 04/30/17   Demetrios Loll, MD    REVIEW OF SYSTEMS:  Review of Systems  Constitutional: Negative for chills, fever, malaise/fatigue and weight loss.  HENT: Negative for ear pain, hearing loss and tinnitus.   Eyes: Negative for blurred vision, double vision, pain and redness.  Respiratory: Negative for cough, hemoptysis and shortness of breath.   Cardiovascular: Negative for chest pain, palpitations, orthopnea and leg swelling.  Gastrointestinal: Negative for abdominal pain,  constipation, diarrhea, nausea and vomiting.  Genitourinary: Negative for dysuria, frequency and hematuria.  Musculoskeletal: Negative for back pain, joint pain and neck pain.  Skin:       No acne, rash, or lesions  Neurological: Negative for dizziness, tremors, focal weakness and weakness.  Endo/Heme/Allergies: Negative for polydipsia. Does not bruise/bleed easily.  Psychiatric/Behavioral: Negative for depression. The patient is not nervous/anxious and does not have insomnia.      VITAL SIGNS:   Vitals:   12/15/17 2020 12/15/17 2100  BP: (!) 84/50 (!) 101/55  Pulse: 80 83  Resp: 16 14  Temp: 97.6 F (36.4 C)   TempSrc: Oral   SpO2: 98% 97%  Weight: 49.9 kg   Height: 5\' 10"  (1.778 m)    Wt Readings from Last 3 Encounters:  12/15/17 49.9 kg  12/03/17 64 kg  06/16/17 58.7 kg    PHYSICAL EXAMINATION:  Physical Exam  Vitals reviewed. Constitutional: He is oriented to person, place, and time. He appears well-developed and well-nourished. No distress.  HENT:  Head: Normocephalic and atraumatic.  Dry mucous membranes  Eyes: Pupils are equal, round, and reactive to light. Conjunctivae and EOM are normal. No scleral icterus.  Neck: Normal range of motion. Neck supple. No JVD present. No thyromegaly present.  Cardiovascular: Normal rate, regular rhythm and intact distal pulses. Exam reveals no gallop and no friction rub.  No murmur heard. Respiratory: Effort normal and breath sounds normal. No respiratory distress. He has no wheezes. He has no rales.  GI: Soft. Bowel sounds are normal. He exhibits no distension. There is no abdominal tenderness.  Musculoskeletal: Normal range of motion.        General: No edema.     Comments: No arthritis, no gout  Lymphadenopathy:    He has no cervical adenopathy.  Neurological: He is alert and oriented to person, place, and time. No cranial nerve deficit.  No dysarthria, no aphasia  Skin: Skin is warm and dry. No rash noted. No erythema.   Psychiatric: He has a normal mood and affect. His behavior is normal. Judgment and thought content normal.    LABORATORY PANEL:   CBC Recent Labs  Lab 12/15/17 2037  WBC 12.6*  HGB 14.1  HCT 45.9  PLT 272   ------------------------------------------------------------------------------------------------------------------  Chemistries  Recent Labs  Lab 12/15/17 2037  NA 127*  K 6.8*  CL 85*  CO2 8*  GLUCOSE 1,188*  BUN 92*  CREATININE 2.81*  CALCIUM 8.6*  AST 26  ALT 23  ALKPHOS 117  BILITOT 1.9*   ------------------------------------------------------------------------------------------------------------------  Cardiac Enzymes No results for input(s): TROPONINI in the last 168 hours. ------------------------------------------------------------------------------------------------------------------  RADIOLOGY:  Dg Chest 1 View  Result Date: 12/15/2017 CLINICAL DATA:  Weakness EXAM: CHEST  1 VIEW COMPARISON:  04/27/2017 FINDINGS: The heart size and mediastinal contours are within normal limits. Both lungs are clear. The visualized skeletal structures are unremarkable. IMPRESSION: No active disease. Electronically Signed   By: Donavan Foil M.D.   On: 12/15/2017 22:17    EKG:   Orders placed or performed during the hospital encounter of 12/15/17  . ED EKG  . ED EKG  . EKG 12-Lead  . EKG 12-Lead    IMPRESSION AND PLAN:  Principal Problem:   DKA (diabetic ketoacidoses) ([place]) -admit to ICU per DKA admission order set with insulin drip and other corresponding supportive orders Active Problems:   Chronic systolic CHF (congestive heart failure) ([place]) -continue home meds  Chart review performed and case discussed with ED provider. Labs, imaging and/or ECG reviewed by provider and discussed with patient/family. Management plans discussed with the patient and/or family.  DVT PROPHYLAXIS: SubQ lovenox   GI PROPHYLAXIS:  None  ADMISSION STATUS: Inpatient      CODE STATUS: Full Code Status History    Date Active Date Inactive Code Status Order ID Comments User Context   04/27/2017 0531 04/29/2017 2118 Full Code [id number]  Harrie Foreman, MD Inpatient      TOTAL CRITICAL CARE TIME TAKING CARE OF THIS PATIENT: 50 minutes.   Zakiyah Diop [place] 12/15/2017, 10:54 PM  [facility]  Office  [phone]  CC: Primary care physician; [place]  Note:  This document was prepared using Systems analyst and may include unintentional dictation errors.

## 2017-12-15 NOTE — Consult Note (Signed)
Name: Shawn [name] MRN: [id number] DOB: 1955-09-05    ADMISSION DATE:  12/15/2017 CONSULTATION DATE: 12/15/2017  REFERRING MD : Dr. Jannifer Franklin   CHIEF COMPLAINT: Weakness and Hyperglycemia  BRIEF PATIENT DESCRIPTION:  62 yo male admitted with DKA secondary to medication noncompliance requiring insulin gtt   SIGNIFICANT EVENTS  12/12-Pt admitted to ICU on insulin gtt   HISTORY OF PRESENT ILLNESS:   This is a 62 yo male with a PMH of Stroke, ETOH Abuse, Tobacco Abuse, Type II Diabetes Mellitus, and Chronic Systolic CHF (EF <40%).  He presented to [facility] ER on 12/12 with hyperglycemia and weakness.  He also endorsed nausea, vomiting, and diarrhea over the past 2 days and glucometer read critical high.  He has not been taking his short or long acting insulin over the past several days because his wife has been in the hospital.  Lab results revealed Na+ 127, K+ 6.8, chloride 85, CO2 8, glucose 1,188, creatinine 2.81, BUN 92, anion gap 34, wbc 12.9, and vbg pH 7.11/pCO2 23.  Lab results ruled pt in for DKA, therefore insulin gtt initiated.  He was subsequently admitted to ICU for additional workup and treatment.   PAST MEDICAL HISTORY :   has a past medical history of Chronic systolic CHF (congestive heart failure) ([place]), Diabetes mellitus with complication ([place]), Polysubstance abuse ([place]), and Stroke ([place]).  has no past surgical history on file. Prior to Admission medications   Medication Sig Start Date End Date Taking? Authorizing Provider  aspirin 81 MG chewable tablet Chew 1 tablet (81 mg total) by mouth daily. 04/30/17   Demetrios Loll, MD  carvedilol (COREG) 6.25 MG tablet Take 1 tablet (6.25 mg total) by mouth 2 (two) times daily with a meal. 07/28/17   Gollan, Kathlene November, MD  cetirizine (ZYRTEC) 10 MG tablet TAKE 1 TABLET BY MOUTH DAILY 10/27/17   Minna Merritts, MD  folic acid (FOLVITE) 1 MG tablet Take 1 tablet (1 mg total) by mouth daily. 04/30/17   Demetrios Loll, MD  furosemide (LASIX)  40 MG tablet Take 1 tablet (40 mg total) by mouth daily. 06/16/17   Minna Merritts, MD  furosemide (LASIX) 40 MG tablet Take 1 tablet (40 mg total) by mouth 2 (two) times daily. 12/03/17 12/03/18  Lisa Roca, MD  insulin aspart (NOVOLOG) 100 UNIT/ML injection Inject 5 Units into the skin 3 (three) times daily with meals.    [provider]  insulin glargine (LANTUS) 100 UNIT/ML injection Inject 15 Units into the skin at bedtime.    [provider]  Multiple Vitamin (MULTIVITAMIN WITH MINERALS) TABS tablet Take 1 tablet by mouth daily. 04/30/17   Demetrios Loll, MD  potassium chloride (K-DUR) 10 MEQ tablet Take 1 tablet (10 mEq total) by mouth daily. 12/03/17   Lisa Roca, MD  sacubitril-valsartan (ENTRESTO) 49-51 MG Take 1 tablet by mouth 2 (two) times daily. 09/26/17   Minna Merritts, MD  thiamine 100 MG tablet Take 1 tablet (100 mg total) by mouth daily. 04/30/17   Demetrios Loll, MD   No Known Allergies  FAMILY HISTORY:  family history includes Congestive Heart Failure in his brother and mother; Diabetes in his mother. SOCIAL HISTORY:  reports that he has been smoking. He has never used smokeless tobacco. He reports previous alcohol use.  REVIEW OF SYSTEMS: Positives in BOLD  Constitutional: Negative for fever, chills, weight loss, malaise/fatigue and diaphoresis.  HENT: Negative for hearing loss, ear pain, nosebleeds, congestion, sore throat, neck pain, tinnitus  and ear discharge.   Eyes: Negative for blurred vision, double vision, photophobia, pain, discharge and redness.  Respiratory: Negative for cough, hemoptysis, sputum production, shortness of breath, wheezing and stridor.   Cardiovascular: Negative for chest pain, palpitations, orthopnea, claudication, leg swelling and PND.  Gastrointestinal: heartburn, nausea, vomiting, abdominal pain, diarrhea, constipation, blood in stool and melena.  Genitourinary: Negative for dysuria, urgency, frequency, hematuria and flank  pain.  Musculoskeletal: Negative for myalgias, back pain, joint pain and falls.  Skin: Negative for itching and rash.  Neurological: Negative for dizziness, tingling, tremors, sensory change, speech change, focal weakness, seizures, loss of consciousness, weakness and headaches.  Endo/Heme/Allergies: hyperkalemia, environmental allergies and polydipsia. Does not bruise/bleed easily.  SUBJECTIVE:  No complaints at this time  VITAL SIGNS: Temp:  [97.6 F (36.4 C)] 97.6 F (36.4 C) (12/12 2020) Pulse Rate:  [80-83] 83 (12/12 2100) Resp:  [14-16] 14 (12/12 2100) BP: (84-101)/(50-55) 101/55 (12/12 2100) SpO2:  [97 %-98 %] 97 % (12/12 2100) Weight:  [49.9 kg] 49.9 kg (12/12 2020)  PHYSICAL EXAMINATION: General: acutely ill appearing male, NAD resting in bed  Neuro: alert and oriented, follows commands HEENT: supple, no JVD Cardiovascular: nsr, rrr, no R/G Lungs: clear throughout, even, non labored  Abdomen: +BS x4, soft, non tender, non distended  Musculoskeletal: normal bulk and tone, no edema  Skin: scabbed over abrasions bilateral lower extremities   Recent Labs  Lab 12/15/17 2037  NA 127*  K 6.8*  CL 85*  CO2 8*  BUN 92*  CREATININE 2.81*  GLUCOSE 1,188*   Recent Labs  Lab 12/15/17 2037  HGB 14.1  HCT 45.9  WBC 12.6*  PLT 272   No results found.  ASSESSMENT / PLAN:  Diabetic Ketoacidosis secondary to medication noncompliance  Continuous telemetry monitoring  Continue insulin gtt until anion gap closed and serum CO2 20 or higher BMP's q4hrs and CBG's q1hr while on insulin gtt Follow hypoglycemic protocol  Educated regarding importance of medication compliance   Acute renal failure likely secondary to dehydration in setting of DKA  Hyperkalemia in setting of DKA  Trend BMP  Replace electrolytes as indicated  Monitor UOP Avoid nephrotoxic medications   Marda Stalker, [place] Pager [phone] (please enter 7 digits) PCCM Consult  Pager [phone] (please enter 7 digits)

## 2017-12-15 NOTE — ED Triage Notes (Signed)
Patient c/o hyperglycemia, weakness, N/V/D X 2 days. Glucometer reading high in triage.

## 2017-12-15 NOTE — Progress Notes (Signed)
eLink Physician-Brief Progress Note Patient Name: DEMARIUS ARCHILA DOB: November 06, 1955 MRN: [id number]   Date of Service  12/15/2017  HPI/Events of Note  DKA as patient missed medications with wife in the hospital  eICU Interventions  On fluids and insulin drip per protocol. Monitor K, no EKG changes     Intervention Category Major Interventions: Hyperglycemia - active titration of insulin therapy;Acid-Base disturbance - evaluation and management  Judd Lien 12/15/2017, 11:58 PM

## 2017-12-15 NOTE — ED Notes (Signed)
Patient from home. Patient has not been taking diabetic medications because he has been with wife at hospital.

## 2017-12-15 NOTE — ED Notes (Signed)
Report given to floor RN Shawna. Patient being transferred to ICU 7.

## 2017-12-16 ENCOUNTER — Encounter: Payer: Self-pay | Admitting: Internal Medicine

## 2017-12-16 LAB — BASIC METABOLIC PANEL
Anion gap: 28 — ABNORMAL HIGH (ref 5–15)
Anion gap: 16 — ABNORMAL HIGH (ref 5–15)
Anion gap: 13 (ref 5–15)
Anion gap: 8 (ref 5–15)
Anion gap: 5 (ref 5–15)
BUN: 91 mg/dL — ABNORMAL HIGH (ref 8–23)
BUN: 90 mg/dL — ABNORMAL HIGH (ref 8–23)
BUN: 87 mg/dL — ABNORMAL HIGH (ref 8–23)
BUN: 79 mg/dL — ABNORMAL HIGH (ref 8–23)
BUN: 74 mg/dL — ABNORMAL HIGH (ref 8–23)
CO2: 13 mmol/L — ABNORMAL LOW (ref 22–32)
CO2: 22 mmol/L (ref 22–32)
CO2: 25 mmol/L (ref 22–32)
CO2: 30 mmol/L (ref 22–32)
CO2: 32 mmol/L (ref 22–32)
Calcium: 8.3 mg/dL — ABNORMAL LOW (ref 8.9–10.3)
Calcium: 8.4 mg/dL — ABNORMAL LOW (ref 8.9–10.3)
Calcium: 8.4 mg/dL — ABNORMAL LOW (ref 8.9–10.3)
Calcium: 8.6 mg/dL — ABNORMAL LOW (ref 8.9–10.3)
Calcium: 8.5 mg/dL — ABNORMAL LOW (ref 8.9–10.3)
Chloride: 90 mmol/L — ABNORMAL LOW (ref 98–111)
Chloride: 96 mmol/L — ABNORMAL LOW (ref 98–111)
Chloride: 100 mmol/L (ref 98–111)
Chloride: 105 mmol/L (ref 98–111)
Chloride: 107 mmol/L (ref 98–111)
Creatinine, Ser: 2.71 mg/dL — ABNORMAL HIGH (ref 0.61–1.24)
Creatinine, Ser: 2.57 mg/dL — ABNORMAL HIGH (ref 0.61–1.24)
Creatinine, Ser: 2.3 mg/dL — ABNORMAL HIGH (ref 0.61–1.24)
Creatinine, Ser: 1.86 mg/dL — ABNORMAL HIGH (ref 0.61–1.24)
Creatinine, Ser: 1.62 mg/dL — ABNORMAL HIGH (ref 0.61–1.24)
GFR calc Af Amer: 28 mL/min — ABNORMAL LOW (ref >60)
GFR calc Af Amer: 30 mL/min — ABNORMAL LOW (ref >60)
GFR calc Af Amer: 34 mL/min — ABNORMAL LOW (ref >60)
GFR calc Af Amer: 44 mL/min — ABNORMAL LOW (ref >60)
GFR calc Af Amer: 52 mL/min — ABNORMAL LOW (ref >60)
GFR calc non Af Amer: 24 mL/min — ABNORMAL LOW (ref >60)
GFR calc non Af Amer: 26 mL/min — ABNORMAL LOW (ref >60)
GFR calc non Af Amer: 30 mL/min — ABNORMAL LOW (ref >60)
GFR calc non Af Amer: 38 mL/min — ABNORMAL LOW (ref >60)
GFR, EST NON AFRICAN AMERICAN: 45 mL/min — AB (ref >60)
GLUCOSE: 853 mg/dL — AB (ref 70–99)
GLUCOSE: 697 mg/dL — AB (ref 70–99)
GLUCOSE: 182 mg/dL — AB (ref 70–99)
Glucose, Bld: 1068 mg/dL (ref 70–99)
Glucose, Bld: 375 mg/dL — ABNORMAL HIGH (ref 70–99)
Potassium: 4.7 mmol/L (ref 3.5–5.1)
Potassium: 4 mmol/L (ref 3.5–5.1)
Potassium: 3.9 mmol/L (ref 3.5–5.1)
Potassium: 3.6 mmol/L (ref 3.5–5.1)
Potassium: 3.2 mmol/L — ABNORMAL LOW (ref 3.5–5.1)
Sodium: 131 mmol/L — ABNORMAL LOW (ref 135–145)
Sodium: 134 mmol/L — ABNORMAL LOW (ref 135–145)
Sodium: 138 mmol/L (ref 135–145)
Sodium: 143 mmol/L (ref 135–145)
Sodium: 144 mmol/L (ref 135–145)

## 2017-12-16 LAB — GLUCOSE, CAPILLARY
GLUCOSE-CAPILLARY: 489 mg/dL — AB (ref 70–99)
GLUCOSE-CAPILLARY: 442 mg/dL — AB (ref 70–99)
GLUCOSE-CAPILLARY: 85 mg/dL (ref 70–99)
GLUCOSE-CAPILLARY: 95 mg/dL (ref 70–99)
Glucose-Capillary: 103 mg/dL — ABNORMAL HIGH (ref 70–99)
Glucose-Capillary: 109 mg/dL — ABNORMAL HIGH (ref 70–99)
Glucose-Capillary: 116 mg/dL — ABNORMAL HIGH (ref 70–99)
Glucose-Capillary: 148 mg/dL — ABNORMAL HIGH (ref 70–99)
Glucose-Capillary: 222 mg/dL — ABNORMAL HIGH (ref 70–99)
Glucose-Capillary: 241 mg/dL — ABNORMAL HIGH (ref 70–99)
Glucose-Capillary: 254 mg/dL — ABNORMAL HIGH (ref 70–99)
Glucose-Capillary: 337 mg/dL — ABNORMAL HIGH (ref 70–99)
Glucose-Capillary: 370 mg/dL — ABNORMAL HIGH (ref 70–99)
Glucose-Capillary: 531 mg/dL (ref 70–99)
Glucose-Capillary: 98 mg/dL (ref 70–99)
Glucose-Capillary: >600 mg/dL (ref 70–99)
Glucose-Capillary: >600 mg/dL (ref 70–99)
Glucose-Capillary: >600 mg/dL (ref 70–99)
Glucose-Capillary: >600 mg/dL (ref 70–99)
Glucose-Capillary: >600 mg/dL (ref 70–99)
Glucose-Capillary: >600 mg/dL (ref 70–99)
Glucose-Capillary: >600 mg/dL (ref 70–99)
Glucose-Capillary: >600 mg/dL (ref 70–99)

## 2017-12-16 LAB — MRSA PCR SCREENING: MRSA by PCR: NEGATIVE

## 2017-12-16 LAB — CBC
HCT: 39.8 % (ref 39.0–52.0)
HEMOGLOBIN: 12.9 g/dL — AB (ref 13.0–17.0)
MCH: 30.6 pg (ref 26.0–34.0)
MCHC: 32.4 g/dL (ref 30.0–36.0)
MCV: 94.5 fL (ref 80.0–100.0)
Platelets: 242 10*3/uL (ref 150–400)
RBC: 4.21 MIL/uL — ABNORMAL LOW (ref 4.22–5.81)
RDW: 13.1 % (ref 11.5–15.5)
WBC: 11.3 10*3/uL — ABNORMAL HIGH (ref 4.0–10.5)
nRBC: 0 % (ref 0.0–0.2)

## 2017-12-16 LAB — MAGNESIUM: Magnesium: 2.7 mg/dL — ABNORMAL HIGH (ref 1.7–2.4)

## 2017-12-16 MED ORDER — INSULIN ASPART 100 UNIT/ML ~~LOC~~ SOLN
0.0000 [IU] | SUBCUTANEOUS | Status: DC
  Administered 2017-12-17: 8 [IU] via SUBCUTANEOUS
  Administered 2017-12-17: 2 [IU] via SUBCUTANEOUS
  Filled 2017-12-16 (×2): qty 1

## 2017-12-16 MED ORDER — INSULIN GLARGINE 100 UNIT/ML ~~LOC~~ SOLN: 15.0000 [IU] | Freq: Every day | SUBCUTANEOUS | Status: DC

## 2017-12-16 MED ORDER — POTASSIUM CHLORIDE 10 MEQ/100ML IV SOLN
10.0000 meq | INTRAVENOUS | Status: AC
  Administered 2017-12-16 (×4): 10 meq via INTRAVENOUS
  Filled 2017-12-16 (×4): qty 100

## 2017-12-16 MED ORDER — ENOXAPARIN SODIUM 30 MG/0.3ML ~~LOC~~ SOLN: 30.0000 mg | SUBCUTANEOUS | Status: DC

## 2017-12-16 MED ORDER — INSULIN ASPART 100 UNIT/ML ~~LOC~~ SOLN: 0.0000 [IU] | Freq: Three times a day (TID) | SUBCUTANEOUS | Status: DC

## 2017-12-16 MED ORDER — SODIUM CHLORIDE 0.9 % IV SOLN: INTRAVENOUS | Status: DC | PRN

## 2017-12-16 MED ORDER — HEPARIN SODIUM (PORCINE) 5000 UNIT/ML IJ SOLN
5000.0000 [IU] | Freq: Three times a day (TID) | INTRAMUSCULAR | Status: DC
  Administered 2017-12-16 – 2017-12-17 (×4): 5000 [IU] via SUBCUTANEOUS
  Filled 2017-12-16 (×4): qty 1

## 2017-12-16 MED ORDER — PANTOPRAZOLE SODIUM 40 MG IV SOLR
40.0000 mg | Freq: Every day | INTRAVENOUS | Status: DC
  Administered 2017-12-16 – 2017-12-17 (×2): 40 mg via INTRAVENOUS
  Filled 2017-12-16 (×2): qty 40

## 2017-12-16 MED ORDER — INSULIN GLARGINE 100 UNIT/ML ~~LOC~~ SOLN
15.0000 [IU] | SUBCUTANEOUS | Status: AC
  Administered 2017-12-16: 15 [IU] via SUBCUTANEOUS
  Filled 2017-12-16: qty 0.15

## 2017-12-16 NOTE — Progress Notes (Signed)
Explained password system for sharing of personal medical information with friends and family.  Pt stated that he "did not" want to set up a password at this time.

## 2017-12-16 NOTE — Care Management (Signed)
RNCM spoke with patient's wife by phone. She just discharged from [facility] yesterday status post knee replacement.  RNCM explained that [facility] forms (refusal and request to transfer to [place]) was left at bedside for her to review. She said he was not himself but she is certain he will not want to go to [place].  She will have her brother in law bring forms home to her and return to CM to send to [facility] [place].

## 2017-12-16 NOTE — Progress Notes (Signed)
PHARMACIST - PHYSICIAN COMMUNICATION  CONCERNING:  Enoxaparin (Lovenox) for DVT Prophylaxis    RECOMMENDATION: Patient was prescribed enoxaprin 40mg  q24 hours for VTE prophylaxis.   Filed Weights   12/15/17 2020 12/16/17 0000  Weight: 110 lb (49.9 kg) 130 lb 1.1 oz (59 kg)    Body mass index is 18.66 kg/m.  Estimated Creatinine Clearance: 28.1 mL/min (A) (by C-G formula based on SCr of 2.3 mg/dL (H)).   Patient is candidate for enoxaparin 30mg  every 24 hours based on CrCl <60ml/min   DESCRIPTION: Pharmacy has adjusted enoxaparin dose.  Patient is now receiving enoxaparin 30mg  every 24 hours.  Pernell Dupre, PharmD, BCPS Clinical Pharmacist 12/16/2017 9:34 AM

## 2017-12-16 NOTE — Progress Notes (Signed)
PHARMACY CONSULT NOTE - FOLLOW UP  Pharmacy Consult for Electrolyte Monitoring and Replacement   Recent Labs: Potassium (mmol/L)  Date Value  12/16/2017 3.2 (L)  06/20/2013 4.3   Magnesium (mg/dL)  Date Value  12/16/2017 2.7 (H)  06/20/2013 1.5 (L)   Calcium (mg/dL)  Date Value  12/16/2017 8.5 (L)   Calcium, Total (mg/dL)  Date Value  06/20/2013 9.0   Albumin (g/dL)  Date Value  12/15/2017 3.0 (L)  07/14/2017 4.0  06/17/2013 3.2 (L)  ]   Assessment: Patient is a 62yo male admitted for DKA. Transitioning off Insulin drip now. Pharmacy consulted for electrolyte management.  Goal of Therapy:  Maintain electrolytes within range  Plan:  Will order KCl 14mEq IV x 4. Recheck labs in AM.   Paulina Fusi, PharmD, BCPS 12/16/2017 7:26 PM

## 2017-12-16 NOTE — Progress Notes (Addendum)
Inpatient Diabetes Program Recommendations  AACE/ADA: New Consensus Statement on Inpatient Glycemic Control (2015)  Target Ranges:  Prepandial:   less than 140 mg/dL      Peak postprandial:   less than 180 mg/dL (1-2 hours)      Critically ill patients:  140 - 180 mg/dL   Results for Shawn [name], Shawn [name] (MRN [id number]) as of 12/16/2017 10:04  Ref. Range 12/15/2017 20:37  Sodium Latest Ref Range: 135 - 145 mmol/L 127 (L)  Potassium Latest Ref Range: 3.5 - 5.1 mmol/L 6.8 (HH)  Chloride Latest Ref Range: 98 - 111 mmol/L 85 (L)  CO2 Latest Ref Range: 22 - 32 mmol/L 8 (L)  Glucose Latest Ref Range: 70 - 99 mg/dL 1,188 (HH)  BUN Latest Ref Range: 8 - 23 mg/dL 92 (H)  Creatinine Latest Ref Range: 0.61 - 1.24 mg/dL 2.81 (H)  Calcium Latest Ref Range: 8.9 - 10.3 mg/dL 8.6 (L)  Anion gap Latest Ref Range: 5 - 15  34 (H)   Results for Shawn [name], Shawn [name] (MRN [id number]) as of 12/16/2017 10:04  Ref. Range 12/16/2017 03:10 12/16/2017 04:15 12/16/2017 05:11 12/16/2017 06:08 12/16/2017 07:12 12/16/2017 08:21 12/16/2017 09:07  Glucose-Capillary Latest Ref Range: 70 - 99 mg/dL >600 (HH) >600 (HH) >600 (HH) >600 (HH) >600 (HH) >600 (HH) 531 (HH)    Admit with: DKA (glucose 1188 mg/dl)   History: DM, CHF, Polysubstance Abuse  Home DM Meds: Lantus 15 units QHS       Novolog 5 units TID  Current Orders: IV Insulin Drip      No Insulin for several days (wife was in hospital and he stayed with her--therefore did not take his insulin).  IV Insulin Drip started at 9:39pm last night.  BMET from 0805am today shows improvement (CO2 up to 25 and Anion Gap down to 13), however, CBGs are still severely elevated.    MD- Once CBGs have stabilized for at least 4 hours (CBGs consistently <180 mg/dl) and BMET shows stable CO2 and stable Anion gap, make sure patient gets his home dose of Lantus at least 1 hour prior to d/c of IV Insulin drip when you are transitioning off the drip (whenever that time  comes).  Please also consider checking current A1c.  Last one on file was 13.5% back on 04/28/2017.    Addendum 3:46pm--Attempted to speak with pt today about not taking his insulin at home and to explain all the treatments we have been giving him to manage and correct his high blood sugars.  Pt was very sleepy and could not keep his eyes open nor could he hold a meaningful conversation with me.  Patient needs to be reminded to take his insulin all the time at home and to not skip doses.  Needs follow up with his PCP as well    --Will follow patient during hospitalization--  Wyn Quaker RN, MSN, CDE Diabetes Coordinator Inpatient Glycemic Control Team Team Pager: [phone] (8a-5p)   CBGs still running 500s as of 9am today.

## 2017-12-16 NOTE — Progress Notes (Signed)
[place] [facility] NAME: Shawn [name]    MR#:  [id number]  DATE OF BIRTH:  07/24/1955  SUBJECTIVE:   patient here with DKA Sleepy not good historian. Patient reports missing insulin dose REVIEW OF SYSTEMS:    Review of Systems  Constitutional: Negative for fever, chills weight loss HENT: Negative for ear pain, nosebleeds, congestion, facial swelling, rhinorrhea, neck pain, neck stiffness and ear discharge.   Respiratory: Negative for cough, shortness of breath, wheezing  Cardiovascular: Negative for chest pain, palpitations and leg swelling.  Gastrointestinal: Negative for heartburn, abdominal pain, vomiting, diarrhea or consitpation Genitourinary: Negative for dysuria, urgency, frequency, hematuria Musculoskeletal: Negative for back pain or joint pain Neurological: Negative for dizziness, seizures, syncope, focal weakness,  numbness and headaches.  Hematological: Does not bruise/bleed easily.  Psychiatric/Behavioral: Negative for hallucinations, confusion, dysphoric mood    Tolerating Diet: npo      DRUG ALLERGIES:  No Known Allergies  VITALS:  Blood pressure 127/68, pulse 75, temperature (!) 97.5 F (36.4 C), temperature source Axillary, resp. rate 14, height 5\' 10"  (1.778 m), weight 59 kg, SpO2 97 %.  PHYSICAL EXAMINATION:  Constitutional: Appears well-developed and well-nourished. No distress. HENT: Normocephalic. Marland Kitchen Oropharynx is clear and moist.  Eyes: Conjunctivae and EOM are normal. PERRLA, no scleral icterus.  Neck: Normal ROM. Neck supple. No JVD. No tracheal deviation. CVS: RRR, S1/S2 +, no murmurs, no gallops, no carotid bruit.  Pulmonary: Effort and breath sounds normal, no stridor, rhonchi, wheezes, rales.  Abdominal: Soft. BS +,  no distension, tenderness, rebound or guarding.  Musculoskeletal: Normal range of motion. No edema and no tenderness.  Neuro: Alert. CN 2-12 grossly intact. No focal  deficits. Skin: Skin is warm and dry. No rash noted. Psychiatric: Sleepy    LABORATORY PANEL:   CBC Recent Labs  Lab 12/16/17 0040  WBC 11.3*  HGB 12.9*  HCT 39.8  PLT 242   ------------------------------------------------------------------------------------------------------------------  Chemistries  Recent Labs  Lab 12/15/17 2037  12/16/17 0805  NA 127*   < > 138  K 6.8*   < > 3.9  CL 85*   < > 100  CO2 8*   < > 25  GLUCOSE 1,188*   < > 697*  BUN 92*   < > 87*  CREATININE 2.81*   < > 2.30*  CALCIUM 8.6*   < > 8.4*  AST 26  --   --   ALT 23  --   --   ALKPHOS 117  --   --   BILITOT 1.9*  --   --    < > = values in this interval not displayed.   ------------------------------------------------------------------------------------------------------------------  Cardiac Enzymes No results for input(s): TROPONINI in the last 168 hours. ------------------------------------------------------------------------------------------------------------------  RADIOLOGY:  Dg Chest 1 View  Result Date: 12/15/2017 CLINICAL DATA:  Weakness EXAM: CHEST  1 VIEW COMPARISON:  04/27/2017 FINDINGS: The heart size and mediastinal contours are within normal limits. Both lungs are clear. The visualized skeletal structures are unremarkable. IMPRESSION: No active disease. Electronically Signed   By: Donavan Foil M.D.   On: 12/15/2017 22:17     ASSESSMENT AND PLAN:    62 year old male with history diabetes and chronic systolic and diastolic heart failure EF 20 to 25% Who presented to the emergency room due to generalized weakness and nausea.  1.  DKA: This is etiology of patient's generalized weakness with nausea and vomiting. Continue DKA protocol per ICU.  2.  Acute kidney  injury in the setting of DKA with dehydration: Continue  IV fluids and repeat BMP Consider nephrology consultation if creatinine has not improved  3.  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 Rockey Situ Patient is euvolemic Lasix and Entresto on hold due to acute kidney injury  4.  Essential hypertension: Restart Coreg  Management plans discussed with the patient and he is in agreement.  CODE STATUS: full  TOTAL TIME TAKING CARE OF THIS PATIENT: 30 minutes.     POSSIBLE D/C 2-3 days, DEPENDING ON CLINICAL CONDITION.   Niyonna Betsill M.D on 12/16/2017 at 10:45 AM  Between 7am to 6pm - Pager - [phone] After 6pm go to [email or web address] - password [email or web address] [facility]  Office  [phone]  CC: Primary care physician; [place]  Note: This dictation was prepared with Dragon dictation along with smaller phrase technology. Any transcriptional errors that result from this process are unintentional.

## 2017-12-17 ENCOUNTER — Other Ambulatory Visit: Payer: Self-pay

## 2017-12-17 LAB — BASIC METABOLIC PANEL
Anion gap: 8 (ref 5–15)
BUN: 63 mg/dL — AB (ref 8–23)
CO2: 30 mmol/L (ref 22–32)
Calcium: 8.7 mg/dL — ABNORMAL LOW (ref 8.9–10.3)
Chloride: 108 mmol/L (ref 98–111)
Creatinine, Ser: 1.33 mg/dL — ABNORMAL HIGH (ref 0.61–1.24)
GFR calc Af Amer: >60 mL/min (ref >60)
GFR calc non Af Amer: 57 mL/min — ABNORMAL LOW (ref >60)
Glucose, Bld: 115 mg/dL — ABNORMAL HIGH (ref 70–99)
Potassium: 4.1 mmol/L (ref 3.5–5.1)
Sodium: 146 mmol/L — ABNORMAL HIGH (ref 135–145)

## 2017-12-17 LAB — PHOSPHORUS: Phosphorus: 1.6 mg/dL — ABNORMAL LOW (ref 2.5–4.6)

## 2017-12-17 LAB — GLUCOSE, CAPILLARY
Glucose-Capillary: 115 mg/dL — ABNORMAL HIGH (ref 70–99)
Glucose-Capillary: 122 mg/dL — ABNORMAL HIGH (ref 70–99)
Glucose-Capillary: 251 mg/dL — ABNORMAL HIGH (ref 70–99)

## 2017-12-17 LAB — HIV ANTIBODY (ROUTINE TESTING W REFLEX): HIV Screen 4th Generation wRfx: NONREACTIVE

## 2017-12-17 LAB — MAGNESIUM: Magnesium: 2.7 mg/dL — ABNORMAL HIGH (ref 1.7–2.4)

## 2017-12-17 LAB — HEMOGLOBIN A1C
Hgb A1c MFr Bld: 12 % — ABNORMAL HIGH (ref 4.8–5.6)
Mean Plasma Glucose: 297.7 mg/dL

## 2017-12-17 MED ORDER — INFLUENZA VAC SPLIT QUAD 0.5 ML IM SUSY
0.5000 mL | PREFILLED_SYRINGE | INTRAMUSCULAR | Status: AC
  Administered 2017-12-17: 13:00:00 0.5 mL via INTRAMUSCULAR
  Filled 2017-12-17: qty 0.5

## 2017-12-17 MED ORDER — POTASSIUM & SODIUM PHOSPHATES 280-160-250 MG PO PACK
2.0000 | PACK | ORAL | Status: DC
  Administered 2017-12-17 (×2): 2 via ORAL
  Filled 2017-12-17 (×4): qty 2

## 2017-12-17 MED ORDER — INFLUENZA VAC SPLIT QUAD 0.5 ML IM SUSY: 0.5000 mL | PREFILLED_SYRINGE | INTRAMUSCULAR | Status: DC

## 2017-12-17 NOTE — Care Management Note (Signed)
Case Management Note  Patient Details  Name: Shawn [name] MRN: [id number] Date of Birth: [birth year]  Subjective/Objective: Patient to be discharged per MD order. Orders in place for home health services. Patient feels like at this time home health is not necessary but he could benefit from a rolling walker. [facility] and brought to bedside. Patient is primary caregiver and feels safe to go home and take care of his spouse who recently had a knee replacement. States that if he changes his mind about home health he is able to get it through the [place]. No further discharge needs. Ines Bloomer RN BSN RNCM [phone]                   Action/Plan:   Expected Discharge Date:  12/17/17               Expected Discharge Plan:     In-House Referral:     Discharge planning Services  CM Consult  Post Acute Care Choice:  Durable Medical Equipment, Home Health Choice offered to:  Patient  DME Arranged:  Walker rolling DME Agency:  [place]:  Patient Refused [place] Agency:     Status of Service:  Completed, signed off  If discussed at [place] of Stay Meetings, dates discussed:    Additional Comments:  Latanya Maudlin, RN 12/17/2017, 11:34 AM

## 2017-12-17 NOTE — Progress Notes (Signed)
Pt being discharged home, discharge instructions reviewed with pt, states understanding, pt with no complaints 

## 2017-12-17 NOTE — Evaluation (Signed)
Clinical/Bedside Swallow Evaluation Patient Details  Name: Shawn [name] MRN: [id number] Date of Birth: [birth year]  Today's Date: 12/17/2017 Time: SLP Start Time (ACUTE ONLY): 1030 SLP Stop Time (ACUTE ONLY): 1044 SLP Time Calculation (min) (ACUTE ONLY): 14 min  Past Medical History:  Past Medical History:  Diagnosis Date  . Chronic systolic CHF (congestive heart failure) ([place])    a. TTE 6/15: EF < 20%, mildly dilated LV, DD, mildly dilated LA, mod dilated RA, mild MR, mild to mod TR, mod increased posterior wall thickness, elevated LA and LVEDP  . Diabetes mellitus with complication ([place])   . Polysubstance abuse ([place])    a. etoh and tobacco  . Stroke [facility])    Past Surgical History:  Past Surgical History:  Procedure Laterality Date  . NO PAST SURGERIES     HPI:      Assessment / Plan / Recommendation Clinical Impression  pt presents with a mild rrisk of aspiration as characterized by an poor intake and slightly drowsy. pt was able to intake dys 3 trials with no overt ssx aspiration. pt was able to intake trials of thin via cup with no coughing, change in vocal quaility or other overt ssx aspiration pt required cues to slow rate of intake, however with large bolus trials, pt did not hve coughing, or vocal quality changes. SLP recommends to upgrade diet to  with thin via cup, no straw. NSG notified. pt struggled to remain alert during evaluation and continued to fall asleep. Pt requested to discontinue trials after thin liquids as he was drowsy. slp educated pt on diet restrictions and small sips vis cup only. Pt verbally agreed and fell asleep.   SLP Visit Diagnosis: Dysphagia, oropharyngeal phase (R13.12)    Aspiration Risk  Mild aspiration risk    Diet Recommendation Dysphagia 3 (Mech soft);Thin liquid   Liquid Administration via: No straw;Cup Medication Administration: Crushed with puree Supervision: Patient able to self feed Compensations: Slow rate;Small  sips/bites;Follow solids with liquid Postural Changes: Seated upright at 90 degrees    Other  Recommendations Oral Care Recommendations: Patient independent with oral care   Follow up Recommendations        Frequency and Duration min 3x week  2 weeks       Prognosis Prognosis for Safe Diet Advancement: Good      Swallow Study   General Date of Onset: 12/17/17 Type of Study: Bedside Swallow Evaluation Diet Prior to this Study: Dysphagia 3 (soft);Nectar-thick liquids Temperature Spikes Noted: No Respiratory Status: Room air History of Recent Intubation: No Behavior/Cognition: Cooperative;Lethargic/Drowsy;Pleasant mood Oral Cavity Assessment: Within Functional Limits Oral Care Completed by SLP: No Oral Cavity - Dentition: Adequate natural dentition Vision: Functional for self-feeding Self-Feeding Abilities: Able to feed self Patient Positioning: Upright in bed Baseline Vocal Quality: Normal Volitional Cough: Strong Volitional Swallow: Able to elicit    Oral/Motor/Sensory Function Overall Oral Motor/Sensory Function: Within functional limits   Ice Chips Ice chips: Within functional limits   Thin Liquid Thin Liquid: Within functional limits Presentation: Cup Other Comments: No STRAW    Nectar Thick Nectar Thick Liquid: Not tested   Honey Thick Honey Thick Liquid: Not tested   Puree Puree: Within functional limits Presentation: Spoon   Solid     Solid: Within functional limits Presentation: Self Fed      Stacie Harris Sauber 12/17/2017,10:49 AM

## 2017-12-17 NOTE — Progress Notes (Signed)
PHARMACY CONSULT NOTE - FOLLOW UP  Pharmacy Consult for Electrolyte Monitoring and Replacement   Recent Labs: Potassium (mmol/L)  Date Value  12/17/2017 4.1  06/20/2013 4.3   Magnesium (mg/dL)  Date Value  12/17/2017 2.7 (H)  06/20/2013 1.5 (L)   Calcium (mg/dL)  Date Value  12/17/2017 8.7 (L)   Calcium, Total (mg/dL)  Date Value  06/20/2013 9.0   Albumin (g/dL)  Date Value  12/15/2017 3.0 (L)  07/14/2017 4.0  06/17/2013 3.2 (L)   Phosphorus (mg/dL)  Date Value  12/17/2017 1.6 (L)  ]   Assessment: Patient is a 62yo male admitted for DKA. Transitioning off Insulin drip now. Pharmacy consulted for electrolyte management. Phos = 1.6  Goal of Therapy:  Maintain electrolytes within range  Plan:  Will order Phos-Nak packets every 4 hours x 4 doses.  Recheck labs in AM.   Olivia Canter, [facility] 12/17/2017 8:12 AM

## 2017-12-17 NOTE — Discharge Summary (Signed)
[place] at [place] NAME: Shawn [name]    MR#:  [id number]  DATE OF BIRTH:  20-Mar-1955  DATE OF ADMISSION:  12/15/2017 ADMITTING PHYSICIAN: Lance Coon, MD  DATE OF DISCHARGE: 12/15/2017  PRIMARY CARE PHYSICIAN: [facility]    ADMISSION DIAGNOSIS:  Acute renal failure, unspecified acute renal failure type ([place]) [N17.9] Diabetic ketoacidosis without coma associated with diabetes mellitus due to underlying condition ([place]) [E08.10]  DISCHARGE DIAGNOSIS:  Principal Problem:   DKA (diabetic ketoacidoses) ([place]) Active Problems:   Chronic systolic CHF (congestive heart failure) ([place])   SECONDARY DIAGNOSIS:   Past Medical History:  Diagnosis Date  . Chronic systolic CHF (congestive heart failure) ([place])    a. TTE 6/15: EF < 20%, mildly dilated LV, DD, mildly dilated LA, mod dilated RA, mild MR, mild to mod TR, mod increased posterior wall thickness, elevated LA and LVEDP  . Diabetes mellitus with complication ([place])   . Polysubstance abuse ([name])    a. etoh and tobacco  . Stroke [facility])     HOSPITAL COURSE:   62 year old male with history diabetes and chronic systolic and diastolic heart failure EF 20 to 25% Who presented to the emergency room due to generalized weakness and nausea.  1.  DKA: This is etiology of patient's generalized weakness with nausea and vomiting. He was on the DKA protocol and it has resolved. He will follow-up with his PCP and continue current outpatient medications.  2.  Acute kidney injury in the setting of DKA with dehydration: Creatinine has improved with IV fluids and treatment for DKA.  I will continue to hold Lasix after discharge.  This can be restarted by his primary care physician.   3.  Chronic systolic and diastolic heart failure ejection fraction 20 to 25% followed by Dr. Rockey Situ Patient is euvolemic He will continue Entresto, however since creatinine is not quite at baseline Lasix  will be on hold till seen by PCP.  4.  Essential hypertension: He may continue Coreg   DISCHARGE CONDITIONS AND DIET:   Stable for discharge on diabetic diet  CONSULTS OBTAINED:    DRUG ALLERGIES:  No Known Allergies  DISCHARGE MEDICATIONS:   Allergies as of 12/17/2017   No Known Allergies     Medication List    STOP taking these medications   furosemide 40 MG tablet Commonly known as:  LASIX     TAKE these medications   aspirin 81 MG chewable tablet Chew 1 tablet (81 mg total) by mouth daily.   carvedilol 6.25 MG tablet Commonly known as:  COREG Take 1 tablet (6.25 mg total) by mouth 2 (two) times daily with a meal.   cetirizine 10 MG tablet Commonly known as:  ZYRTEC TAKE 1 TABLET BY MOUTH DAILY   folic acid 1 MG tablet Commonly known as:  FOLVITE Take 1 tablet (1 mg total) by mouth daily.   insulin aspart 100 UNIT/ML injection Commonly known as:  novoLOG Inject 5 Units into the skin 3 (three) times daily with meals.   insulin glargine 100 UNIT/ML injection Commonly known as:  LANTUS Inject 15 Units into the skin at bedtime.   multivitamin with minerals Tabs tablet Take 1 tablet by mouth daily.   potassium chloride 10 MEQ tablet Commonly known as:  K-DUR Take 1 tablet (10 mEq total) by mouth daily.   sacubitril-valsartan 49-51 MG Commonly known as:  ENTRESTO Take 1 tablet by mouth 2 (two) times daily.   thiamine  100 MG tablet Take 1 tablet (100 mg total) by mouth daily.         Today   CHIEF COMPLAINT:  Patient is doing well without any issues this morning   VITAL SIGNS:  Blood pressure 119/77, pulse 78, temperature 98.7 F (37.1 C), resp. rate 18, height 5\' 11"  (1.803 m), weight 59.4 kg, SpO2 95 %.   REVIEW OF SYSTEMS:  Review of Systems  Constitutional: Negative.  Negative for chills, fever and malaise/fatigue.  HENT: Negative.  Negative for ear discharge, ear pain, hearing loss, nosebleeds and sore throat.   Eyes: Negative.   Negative for blurred vision and pain.  Respiratory: Negative.  Negative for cough, hemoptysis, shortness of breath and wheezing.   Cardiovascular: Negative.  Negative for chest pain, palpitations and leg swelling.  Gastrointestinal: Negative.  Negative for abdominal pain, blood in stool, diarrhea, nausea and vomiting.  Genitourinary: Negative.  Negative for dysuria.  Musculoskeletal: Negative.  Negative for back pain.  Skin: Negative.   Neurological: Negative for dizziness, tremors, speech change, focal weakness, seizures and headaches.  Endo/Heme/Allergies: Negative.  Does not bruise/bleed easily.  Psychiatric/Behavioral: Negative.  Negative for depression, hallucinations and suicidal ideas.     PHYSICAL EXAMINATION:  GENERAL:  62 y.o.-year-old patient lying in the bed with no acute distress.  NECK:  Supple, no jugular venous distention. No thyroid enlargement, no tenderness.  LUNGS: Normal breath sounds bilaterally, no wheezing, rales,rhonchi  No use of accessory muscles of respiration.  CARDIOVASCULAR: S1, S2 normal. No murmurs, rubs, or gallops.  ABDOMEN: Soft, non-tender, non-distended. Bowel sounds present. No organomegaly or mass.  EXTREMITIES: No pedal edema, cyanosis, or clubbing.  PSYCHIATRIC: The patient is alert and oriented x 3.  SKIN: No obvious rash, lesion, or ulcer.   DATA REVIEW:   CBC Recent Labs  Lab 12/16/17 0040  WBC 11.3*  HGB 12.9*  HCT 39.8  PLT 242    Chemistries  Recent Labs  Lab 12/15/17 2037  12/17/17 0527  NA 127*   < > 146*  K 6.8*   < > 4.1  CL 85*   < > 108  CO2 8*   < > 30  GLUCOSE 1,188*   < > 115*  BUN 92*   < > 63*  CREATININE 2.81*   < > 1.33*  CALCIUM 8.6*   < > 8.7*  MG  --    < > 2.7*  AST 26  --   --   ALT 23  --   --   ALKPHOS 117  --   --   BILITOT 1.9*  --   --    < > = values in this interval not displayed.    Cardiac Enzymes No results for input(s): TROPONINI in the last 168 hours.  Microbiology Results   @[email or web address] @  RADIOLOGY:  Dg Chest 1 View  Result Date: 12/15/2017 CLINICAL DATA:  Weakness EXAM: CHEST  1 VIEW COMPARISON:  04/27/2017 FINDINGS: The heart size and mediastinal contours are within normal limits. Both lungs are clear. The visualized skeletal structures are unremarkable. IMPRESSION: No active disease. Electronically Signed   By: Donavan Foil M.D.   On: 12/15/2017 22:17      Allergies as of 12/17/2017   No Known Allergies     Medication List    STOP taking these medications   furosemide 40 MG tablet Commonly known as:  LASIX     TAKE these medications   aspirin 81 MG chewable tablet Chew 1 tablet (81  mg total) by mouth daily.   carvedilol 6.25 MG tablet Commonly known as:  COREG Take 1 tablet (6.25 mg total) by mouth 2 (two) times daily with a meal.   cetirizine 10 MG tablet Commonly known as:  ZYRTEC TAKE 1 TABLET BY MOUTH DAILY   folic acid 1 MG tablet Commonly known as:  FOLVITE Take 1 tablet (1 mg total) by mouth daily.   insulin aspart 100 UNIT/ML injection Commonly known as:  novoLOG Inject 5 Units into the skin 3 (three) times daily with meals.   insulin glargine 100 UNIT/ML injection Commonly known as:  LANTUS Inject 15 Units into the skin at bedtime.   multivitamin with minerals Tabs tablet Take 1 tablet by mouth daily.   potassium chloride 10 MEQ tablet Commonly known as:  K-DUR Take 1 tablet (10 mEq total) by mouth daily.   sacubitril-valsartan 49-51 MG Commonly known as:  ENTRESTO Take 1 tablet by mouth 2 (two) times daily.   thiamine 100 MG tablet Take 1 tablet (100 mg total) by mouth daily.        Management plans discussed with the patient and he is in agreement. Stable for discharge home  Patient should follow up with pcp  CODE STATUS:     Code Status Orders  (From admission, onward)         Start     Ordered   12/15/17 2358  Full code  Continuous     12/15/17 2358        Code Status History    Date  Active Date Inactive Code Status Order ID Comments User Context   04/27/2017 0531 04/29/2017 2118 Full Code [id number]  Harrie Foreman, MD Inpatient      TOTAL TIME TAKING CARE OF THIS PATIENT: 38 minutes.    Note: This dictation was prepared with Dragon dictation along with smaller phrase technology. Any transcriptional errors that result from this process are unintentional.  James Senn M.D on 12/17/2017 at 11:31 AM  Between 7am to 6pm - Pager - [phone] After 6pm go to [email or web address] - password [email or web address] [facility]  Office  [phone]  CC: Primary care physician; [facility] [place]

## 2018-02-09 ENCOUNTER — Ambulatory Visit: Payer: Medicaid Other | Admitting: Physician Assistant

## 2018-03-14 ENCOUNTER — Ambulatory Visit (INDEPENDENT_AMBULATORY_CARE_PROVIDER_SITE_OTHER): Payer: Medicaid Other | Admitting: Nurse Practitioner

## 2018-03-14 ENCOUNTER — Encounter: Payer: Self-pay | Admitting: Nurse Practitioner

## 2018-03-14 VITALS — BP 124/70 | HR 76 | Ht 71.0 in | Wt 142.2 lb

## 2018-03-14 DIAGNOSIS — I5042 Chronic combined systolic (congestive) and diastolic (congestive) heart failure: Secondary | ICD-10-CM

## 2018-03-14 DIAGNOSIS — Z794 Long term (current) use of insulin: Secondary | ICD-10-CM

## 2018-03-14 DIAGNOSIS — E1122 Type 2 diabetes mellitus with diabetic chronic kidney disease: Secondary | ICD-10-CM | POA: Diagnosis not present

## 2018-03-14 DIAGNOSIS — I428 Other cardiomyopathies: Secondary | ICD-10-CM | POA: Diagnosis not present

## 2018-03-14 DIAGNOSIS — I10 Essential (primary) hypertension: Secondary | ICD-10-CM | POA: Diagnosis not present

## 2018-03-14 DIAGNOSIS — Z720 Tobacco use: Secondary | ICD-10-CM | POA: Diagnosis not present

## 2018-03-14 DIAGNOSIS — N182 Chronic kidney disease, stage 2 (mild): Secondary | ICD-10-CM

## 2018-03-14 MED ORDER — SACUBITRIL-VALSARTAN 97-103 MG PO TABS: 1.0000 | ORAL_TABLET | Freq: Two times a day (BID) | ORAL | 11 refills | Status: DC

## 2018-03-14 NOTE — Patient Instructions (Signed)
Medication Instructions:  Your physician has recommended you make the following change in your medication:  1- INCREASE Entresto Take 1 tablet by mouth 2 (two) times daily.  If you need a refill on your cardiac medications before your next appointment, please call your pharmacy.   Lab work: Your physician recommends that you return to clinic for lab work in: 1 week on ___________ @__________AM /PM. Labs included are; BMET. We will contact you with results in 1-2 business days.   If you have labs (blood work) drawn today and your tests are completely normal, you will receive your results only by: Marland Kitchen MyChart Message (if you have MyChart) OR . A paper copy in the mail If you have any lab test that is abnormal or we need to change your treatment, we will call you to review the results.  Testing/Procedures: 1- Echo Echo  Please return to [facility] on ______________ at _______________ AM/PM for an Echocardiogram. Your physician has requested that you have an echocardiogram. Echocardiography is a painless test that uses sound waves to create images of your heart. It provides your doctor with information about the size and shape of your heart and how well your heart's chambers and valves are working. This procedure takes approximately one hour. There are no restrictions for this procedure. Please note; depending on visual quality an IV may need to be placed.    Follow-Up: At [facility], you and your health needs are our priority.  As part of our continuing mission to provide you with exceptional heart care, we have created designated Provider Care Teams.  These Care Teams include your primary Cardiologist (physician) and Advanced Practice Providers (APPs -  Physician Assistants and Nurse Practitioners) who all work together to provide you with the care you need, when you need it. You will need a follow up appointment in 3 months. You may see Shawn Rogue, Shawn [name] orChristopher Sharolyn Douglas,  Shawn [name]

## 2018-03-14 NOTE — Progress Notes (Signed)
Office Visit    Patient Name: Shawn [name] Date of Encounter: 03/14/2018  Primary Care Provider:  [facility] Primary Cardiologist:  Ida Rogue, MD  Chief Complaint    63 y/o ? with a h/o NICM, combined systolic and diastolic congestive heart failure, poorly controlled diabetes with prior admissions for DKA, prior alcohol abuse, ongoing tobacco abuse, hypertension, and stage II chronic kidney disease, who presents for follow-up related to heart failure.  Past Medical History    Past Medical History:  Diagnosis Date  . Chronic combined systolic (congestive) and diastolic (congestive) heart failure ([place])    a. TTE 6/15: EF < 20%, mildly dilated LV, DD, mildly dilated LA, mod dilated RA, mild MR, mild to mod TR, mod increased posterior wall thickness, elevated LA and LVEDP; b. 4/12019 Echo: EF 20-25%, diff HK. Gr1 DD, nl RV fxn.  . CKD (chronic kidney disease), stage II   . Diabetes mellitus with complication ([place])    a. Prior admissions w/ DKA (last 12/2017).  . Essential hypertension   . NICM (nonischemic cardiomyopathy) ([place])    a. 06/2013 Echo: EF<20%; b. 06/2013 Cath: no signif dzs; c. 04/2017 Echo: 20-25%, gr1 DD.  Marland Kitchen Polysubstance abuse ([place])    a. etoh and tobacco  . Stroke [facility])    Past Surgical History:  Procedure Laterality Date  . NO PAST SURGERIES      Allergies  No Known Allergies  History of Present Illness    63 year old male with the above complex past medical history including nonischemic cardiomyopathy and chronic combined systolic and diastolic heart failure with an EF of 20 to 25% by most recent echo in 2019.  He previously underwent diagnostic catheterization in 2015 which was nonobstructive.  Other history includes polysubstance abuse with ongoing tobacco abuse and remote alcohol abuse, prior strokes, poorly controlled diabetes with prior admissions for DKA, hypertension, and stage II chronic kidney disease.  He was last seen in  cardiology clinic in June 2019.  In December 2019 he was admitted to [facility] in the setting of DKA.  He is followed by primary care at the [facility] and was last seen in February.  He says he had lab work at that time and believes that everything looked okay.  Since his hospitalization in December, he reports doing reasonably well from a cardiac standpoint.  He denies chest pain or dyspnea.  He does note mild to moderate bilateral lower extremity swelling which typically worsens throughout the day and is improved in the morning.  His weight fluctuates between 138 and 145 at home.  He goes out to eat for lunch on a daily basis.  When his wife cooks for him, she avoids salt.  He denies palpitations, PND, orthopnea, dizziness, syncope, or early satiety.  His wife had questions about his medicines today.  He is taking both losartan and Entresto.  Home Medications    Prior to Admission medications   Medication Sig Start Date End Date Taking? Authorizing Provider  aspirin 81 MG chewable tablet Chew 1 tablet (81 mg total) by mouth daily. 04/30/17  Yes Demetrios Loll, MD  atorvastatin (LIPITOR) 20 MG tablet Take 10 mg by mouth at bedtime.   Yes [provider]  baclofen (LIORESAL) 10 MG tablet Take 10 mg by mouth 2 (two) times daily as needed for muscle spasms. Muscle spasm   Yes [provider]  carvedilol (COREG) 12.5 MG tablet Take 12.5 mg by mouth 2 (two) times daily with  a meal.   Yes [provider]  cetirizine (ZYRTEC) 10 MG tablet TAKE 1 TABLET BY MOUTH DAILY 10/27/17  Yes Gollan, Kathlene November, MD  furosemide (LASIX) 40 MG tablet Take 40 mg by mouth 2 (two) times daily.   Yes [provider]  hydrALAZINE (APRESOLINE) 100 MG tablet Take 100 mg by mouth 2 (two) times daily.   Yes [provider]  insulin aspart (NOVOLOG) 100 UNIT/ML injection Inject 5 Units into the skin 3 (three) times daily with meals.   Yes [provider]  insulin glargine (LANTUS)  100 UNIT/ML injection Inject 15 Units into the skin at bedtime.   Yes [provider]  losartan (COZAAR) 25 MG tablet Take 25 mg by mouth daily.   Yes [provider]  Multiple Vitamin (MULTIVITAMIN WITH MINERALS) TABS tablet Take 1 tablet by mouth daily. 04/30/17  Yes Demetrios Loll, MD  sacubitril-valsartan (ENTRESTO) 49-51 MG Take 1 tablet by mouth 2 (two) times daily. 09/26/17  Yes Minna Merritts, MD  spironolactone (ALDACTONE) 25 MG tablet Take 25 mg by mouth at bedtime.   Yes [provider]  venlafaxine (EFFEXOR) 37.5 MG tablet Take 37.5 mg by mouth daily.   Yes [provider]    Review of Systems    Mild to moderate lower extremity edema especially after prolonged standing.  He eats out daily.  He denies chest pain, palpitations, PND, orthopnea, dyspnea, dizziness, syncope, or early satiety.  All other systems reviewed and are otherwise negative except as noted above.  Physical Exam    VS:  BP 124/70 (BP Location: Left Arm, Patient Position: Sitting, Cuff Size: Normal)   Pulse 76   Ht 5\' 11"  (1.803 m)   Wt 142 lb 4 oz (64.5 kg)   BMI 19.84 kg/m  , BMI Body mass index is 19.84 kg/m. GEN: Well nourished, well developed, in no acute distress. HEENT: normal. Neck: Supple, no JVD, carotid bruits, or masses. Cardiac: RRR, no murmurs, rubs, or gallops. No clubbing, cyanosis, 1+ bilateral lower extremity edema to the mid calf.  Radials/PT 2+ and equal bilaterally.  Respiratory:  Respirations regular and unlabored, clear to auscultation bilaterally. GI: Soft, nontender, nondistended, BS + x 4. MS: no deformity or atrophy. Skin: warm and dry, no rash. Neuro:  Strength and sensation are intact. Psych: Normal affect.  Accessory Clinical Findings    ECG personally reviewed by me today -regular sinus rhythm, 76, LVH- no acute changes.  Assessment & Plan    1.  Nonischemic cardiomyopathy/chronic combined systolic diastolic congestive heart failure:  Patient with some degree of mild to moderate bilateral lower extremity edema in the setting of dietary indiscretions, eating out every day for lunch.  With the exception of mild lower extremity swelling, he otherwise appears euvolemic on exam.  We discussed the importance of daily weights, sodium restriction, medication compliance, and symptom reporting and he verbalizes understanding.  I suspect that if he reduced his sodium intake during the day, that his swelling will improve and he might actually require less furosemide (currently on 40 twice daily).  He remains on beta-blocker, Entresto, spironolactone, hydralazine and is also on low-dose losartan.  I am going to discontinue losartan and instead increase Entresto to 97/103 twice daily.  I will arrange for a follow-up basic metabolic panel next week.  I have encouraged him to wear knee-high compression socks to assist in management of swelling.  I also advised that he may take an extra half of the Lasix (20  mg) in the a.m. for weight gain greater than 2 pounds overnight.  I will arrange for a follow-up echocardiogram in 2 months following optimization of medical therapy and if EF remains less than 35%, we can look to refer to electrophysiology.  We did discuss this today and he would potentially be interested in ICD therapy if felt to be appropriate.  He remains off of alcohol.  2.  Essential hypertension: Stable.  Adjusting Entresto and discontinuing losartan.  Continue beta-blocker, hydralazine, Lasix, and Spironolactone  3.  Ongoing tobacco abuse: Currently smoking half pack a day.  He does not currently have a plan to quit and is not currently motivated to quit.  Cessation advised.  4.  Stage II chronic kidney disease: Follow-up basic metabolic panel in 1 week given escalation of Entresto therapy and discontinuation of losartan.  5.  Type 2 diabetes mellitus: Followed by the [facility].  Hospitalized for DKA in December.  He remains on insulin therapy.  6.   Disposition: Follow-up basic metabolic panel in 1 week.  Follow-up echo in 2 months and consider EP referral at that time.  Follow-up with Dr. Rockey Situ in 3 months.  Murray Hodgkins, NP 03/14/2018, 10:21 AM

## 2018-03-21 ENCOUNTER — Other Ambulatory Visit: Payer: Self-pay

## 2018-03-21 ENCOUNTER — Emergency Department: Payer: Medicaid Other

## 2018-03-21 ENCOUNTER — Encounter: Payer: Self-pay | Admitting: Pulmonary Disease

## 2018-03-21 ENCOUNTER — Inpatient Hospital Stay
Admission: EM | Admit: 2018-03-21 | Discharge: 2018-03-24 | DRG: 637 | Disposition: A | Discharge status: Home Health | Payer: Medicaid Other | Attending: Family Medicine | Admitting: Family Medicine

## 2018-03-21 ENCOUNTER — Other Ambulatory Visit: Payer: Medicaid Other

## 2018-03-21 DIAGNOSIS — Z8673 Personal history of transient ischemic attack (TIA), and cerebral infarction without residual deficits: Secondary | ICD-10-CM | POA: Diagnosis not present

## 2018-03-21 DIAGNOSIS — N184 Chronic kidney disease, stage 4 (severe): Secondary | ICD-10-CM | POA: Diagnosis present

## 2018-03-21 DIAGNOSIS — E1110 Type 2 diabetes mellitus with ketoacidosis without coma: Secondary | ICD-10-CM | POA: Diagnosis not present

## 2018-03-21 DIAGNOSIS — E878 Other disorders of electrolyte and fluid balance, not elsewhere classified: Secondary | ICD-10-CM | POA: Diagnosis present

## 2018-03-21 DIAGNOSIS — R579 Shock, unspecified: Secondary | ICD-10-CM | POA: Diagnosis not present

## 2018-03-21 DIAGNOSIS — E785 Hyperlipidemia, unspecified: Secondary | ICD-10-CM | POA: Diagnosis present

## 2018-03-21 DIAGNOSIS — I700 Atherosclerosis of aorta: Secondary | ICD-10-CM | POA: Diagnosis present

## 2018-03-21 DIAGNOSIS — G92 Toxic encephalopathy: Secondary | ICD-10-CM | POA: Diagnosis present

## 2018-03-21 DIAGNOSIS — I509 Heart failure, unspecified: Secondary | ICD-10-CM | POA: Diagnosis not present

## 2018-03-21 DIAGNOSIS — E1111 Type 2 diabetes mellitus with ketoacidosis with coma: Secondary | ICD-10-CM | POA: Diagnosis present

## 2018-03-21 DIAGNOSIS — E1122 Type 2 diabetes mellitus with diabetic chronic kidney disease: Secondary | ICD-10-CM | POA: Diagnosis present

## 2018-03-21 DIAGNOSIS — Z7982 Long term (current) use of aspirin: Secondary | ICD-10-CM

## 2018-03-21 DIAGNOSIS — E872 Acidosis, unspecified: Secondary | ICD-10-CM

## 2018-03-21 DIAGNOSIS — Z79899 Other long term (current) drug therapy: Secondary | ICD-10-CM

## 2018-03-21 DIAGNOSIS — I2510 Atherosclerotic heart disease of native coronary artery without angina pectoris: Secondary | ICD-10-CM | POA: Diagnosis present

## 2018-03-21 DIAGNOSIS — F419 Anxiety disorder, unspecified: Secondary | ICD-10-CM | POA: Diagnosis present

## 2018-03-21 DIAGNOSIS — Z833 Family history of diabetes mellitus: Secondary | ICD-10-CM

## 2018-03-21 DIAGNOSIS — I959 Hypotension, unspecified: Secondary | ICD-10-CM | POA: Diagnosis not present

## 2018-03-21 DIAGNOSIS — I130 Hypertensive heart and chronic kidney disease with heart failure and stage 1 through stage 4 chronic kidney disease, or unspecified chronic kidney disease: Secondary | ICD-10-CM | POA: Diagnosis present

## 2018-03-21 DIAGNOSIS — E870 Hyperosmolality and hypernatremia: Secondary | ICD-10-CM | POA: Diagnosis present

## 2018-03-21 DIAGNOSIS — T68XXXA Hypothermia, initial encounter: Secondary | ICD-10-CM

## 2018-03-21 DIAGNOSIS — T383X6A Underdosing of insulin and oral hypoglycemic [antidiabetic] drugs, initial encounter: Secondary | ICD-10-CM | POA: Diagnosis present

## 2018-03-21 DIAGNOSIS — Z8249 Family history of ischemic heart disease and other diseases of the circulatory system: Secondary | ICD-10-CM | POA: Diagnosis not present

## 2018-03-21 DIAGNOSIS — R4182 Altered mental status, unspecified: Secondary | ICD-10-CM

## 2018-03-21 DIAGNOSIS — I5042 Chronic combined systolic (congestive) and diastolic (congestive) heart failure: Secondary | ICD-10-CM | POA: Diagnosis present

## 2018-03-21 DIAGNOSIS — F17210 Nicotine dependence, cigarettes, uncomplicated: Secondary | ICD-10-CM | POA: Diagnosis present

## 2018-03-21 DIAGNOSIS — N179 Acute kidney failure, unspecified: Secondary | ICD-10-CM | POA: Diagnosis not present

## 2018-03-21 DIAGNOSIS — E860 Dehydration: Secondary | ICD-10-CM | POA: Diagnosis present

## 2018-03-21 DIAGNOSIS — Z794 Long term (current) use of insulin: Secondary | ICD-10-CM

## 2018-03-21 DIAGNOSIS — F1020 Alcohol dependence, uncomplicated: Secondary | ICD-10-CM | POA: Diagnosis present

## 2018-03-21 DIAGNOSIS — I5022 Chronic systolic (congestive) heart failure: Secondary | ICD-10-CM | POA: Diagnosis not present

## 2018-03-21 DIAGNOSIS — E875 Hyperkalemia: Secondary | ICD-10-CM | POA: Diagnosis not present

## 2018-03-21 DIAGNOSIS — I428 Other cardiomyopathies: Secondary | ICD-10-CM | POA: Diagnosis present

## 2018-03-21 DIAGNOSIS — E861 Hypovolemia: Secondary | ICD-10-CM | POA: Diagnosis present

## 2018-03-21 DIAGNOSIS — R680 Hypothermia, not associated with low environmental temperature: Secondary | ICD-10-CM | POA: Diagnosis present

## 2018-03-21 DIAGNOSIS — J90 Pleural effusion, not elsewhere classified: Secondary | ICD-10-CM

## 2018-03-21 LAB — BASIC METABOLIC PANEL
Anion gap: 22 — ABNORMAL HIGH (ref 5–15)
Anion gap: 14 (ref 5–15)
Anion gap: 11 (ref 5–15)
Anion gap: 7 (ref 5–15)
BUN: 72 mg/dL — ABNORMAL HIGH (ref 8–23)
BUN: 68 mg/dL — ABNORMAL HIGH (ref 8–23)
BUN: 61 mg/dL — ABNORMAL HIGH (ref 8–23)
BUN: 56 mg/dL — ABNORMAL HIGH (ref 8–23)
CHLORIDE: 108 mmol/L (ref 98–111)
CO2: 18 mmol/L — ABNORMAL LOW (ref 22–32)
CO2: 24 mmol/L (ref 22–32)
CO2: 29 mmol/L (ref 22–32)
CO2: 31 mmol/L (ref 22–32)
Calcium: 8.3 mg/dL — ABNORMAL LOW (ref 8.9–10.3)
Calcium: 8.2 mg/dL — ABNORMAL LOW (ref 8.9–10.3)
Calcium: 8.5 mg/dL — ABNORMAL LOW (ref 8.9–10.3)
Calcium: 8.4 mg/dL — ABNORMAL LOW (ref 8.9–10.3)
Chloride: 112 mmol/L — ABNORMAL HIGH (ref 98–111)
Chloride: 116 mmol/L — ABNORMAL HIGH (ref 98–111)
Chloride: 120 mmol/L — ABNORMAL HIGH (ref 98–111)
Creatinine, Ser: 2.26 mg/dL — ABNORMAL HIGH (ref 0.61–1.24)
Creatinine, Ser: 2.08 mg/dL — ABNORMAL HIGH (ref 0.61–1.24)
Creatinine, Ser: 1.64 mg/dL — ABNORMAL HIGH (ref 0.61–1.24)
Creatinine, Ser: 1.44 mg/dL — ABNORMAL HIGH (ref 0.61–1.24)
GFR calc Af Amer: 39 mL/min — ABNORMAL LOW (ref >60)
GFR calc Af Amer: >60 mL/min (ref >60)
GFR calc non Af Amer: 30 mL/min — ABNORMAL LOW (ref >60)
GFR calc non Af Amer: 33 mL/min — ABNORMAL LOW (ref >60)
GFR calc non Af Amer: 44 mL/min — ABNORMAL LOW (ref >60)
GFR calc non Af Amer: 52 mL/min — ABNORMAL LOW (ref >60)
GFR, EST AFRICAN AMERICAN: 35 mL/min — AB (ref >60)
GFR, EST AFRICAN AMERICAN: 52 mL/min — AB (ref >60)
GLUCOSE: 182 mg/dL — AB (ref 70–99)
Glucose, Bld: 905 mg/dL (ref 70–99)
Glucose, Bld: 704 mg/dL (ref 70–99)
Glucose, Bld: 399 mg/dL — ABNORMAL HIGH (ref 70–99)
Potassium: 3.6 mmol/L (ref 3.5–5.1)
Potassium: 3.5 mmol/L (ref 3.5–5.1)
Potassium: 3.4 mmol/L — ABNORMAL LOW (ref 3.5–5.1)
Potassium: 3.2 mmol/L — ABNORMAL LOW (ref 3.5–5.1)
Sodium: 148 mmol/L — ABNORMAL HIGH (ref 135–145)
Sodium: 150 mmol/L — ABNORMAL HIGH (ref 135–145)
Sodium: 156 mmol/L — ABNORMAL HIGH (ref 135–145)
Sodium: 158 mmol/L — ABNORMAL HIGH (ref 135–145)

## 2018-03-21 LAB — RESPIRATORY PANEL BY PCR
Adenovirus: NOT DETECTED
Bordetella pertussis: NOT DETECTED
Chlamydophila pneumoniae: NOT DETECTED
Coronavirus 229E: NOT DETECTED
Coronavirus HKU1: NOT DETECTED
Coronavirus NL63: NOT DETECTED
Coronavirus OC43: NOT DETECTED
Influenza A: NOT DETECTED
Influenza B: NOT DETECTED
METAPNEUMOVIRUS-RVPPCR: NOT DETECTED
Mycoplasma pneumoniae: NOT DETECTED
Parainfluenza Virus 1: NOT DETECTED
Parainfluenza Virus 2: NOT DETECTED
Parainfluenza Virus 3: NOT DETECTED
Parainfluenza Virus 4: NOT DETECTED
Respiratory Syncytial Virus: NOT DETECTED
Rhinovirus / Enterovirus: NOT DETECTED

## 2018-03-21 LAB — BLOOD GAS, VENOUS
Acid-base deficit: 15.8 mmol/L — ABNORMAL HIGH (ref 0.0–2.0)
BICARBONATE: 10.4 mmol/L — AB (ref 20.0–28.0)
O2 Saturation: 93.6 %
Patient temperature: 37
pCO2, Ven: 26 mmHg — ABNORMAL LOW (ref 44.0–60.0)
pH, Ven: 7.21 — ABNORMAL LOW (ref 7.250–7.430)
pO2, Ven: 84 mmHg — ABNORMAL HIGH (ref 32.0–45.0)

## 2018-03-21 LAB — BETA-HYDROXYBUTYRIC ACID: Beta-Hydroxybutyric Acid: >8 mmol/L — ABNORMAL HIGH (ref 0.05–0.27)

## 2018-03-21 LAB — MRSA PCR SCREENING: MRSA by PCR: NEGATIVE

## 2018-03-21 LAB — GLUCOSE, CAPILLARY
Glucose-Capillary: >600 mg/dL (ref 70–99)
Glucose-Capillary: >600 mg/dL (ref 70–99)
Glucose-Capillary: >600 mg/dL (ref 70–99)
Glucose-Capillary: >600 mg/dL (ref 70–99)
Glucose-Capillary: >600 mg/dL (ref 70–99)
Glucose-Capillary: >600 mg/dL (ref 70–99)
Glucose-Capillary: >600 mg/dL (ref 70–99)
Glucose-Capillary: >600 mg/dL (ref 70–99)
Glucose-Capillary: >600 mg/dL (ref 70–99)
Glucose-Capillary: 559 mg/dL (ref 70–99)
Glucose-Capillary: 523 mg/dL (ref 70–99)
Glucose-Capillary: 433 mg/dL — ABNORMAL HIGH (ref 70–99)
Glucose-Capillary: 393 mg/dL — ABNORMAL HIGH (ref 70–99)
Glucose-Capillary: 333 mg/dL — ABNORMAL HIGH (ref 70–99)
Glucose-Capillary: 285 mg/dL — ABNORMAL HIGH (ref 70–99)
Glucose-Capillary: 240 mg/dL — ABNORMAL HIGH (ref 70–99)
Glucose-Capillary: 191 mg/dL — ABNORMAL HIGH (ref 70–99)
Glucose-Capillary: 161 mg/dL — ABNORMAL HIGH (ref 70–99)

## 2018-03-21 LAB — COMPREHENSIVE METABOLIC PANEL
ALT: 19 U/L (ref 0–44)
AST: 26 U/L (ref 15–41)
Albumin: 2.9 g/dL — ABNORMAL LOW (ref 3.5–5.0)
Alkaline Phosphatase: 108 U/L (ref 38–126)
Anion gap: 30 — ABNORMAL HIGH (ref 5–15)
BUN: 71 mg/dL — ABNORMAL HIGH (ref 8–23)
CO2: 11 mmol/L — ABNORMAL LOW (ref 22–32)
Calcium: 8.5 mg/dL — ABNORMAL LOW (ref 8.9–10.3)
Chloride: 99 mmol/L (ref 98–111)
Creatinine, Ser: 2.54 mg/dL — ABNORMAL HIGH (ref 0.61–1.24)
GFR calc Af Amer: 30 mL/min — ABNORMAL LOW (ref >60)
GFR calc non Af Amer: 26 mL/min — ABNORMAL LOW (ref >60)
Glucose, Bld: 1223 mg/dL (ref 70–99)
POTASSIUM: 5.3 mmol/L — AB (ref 3.5–5.1)
Sodium: 140 mmol/L (ref 135–145)
Total Bilirubin: 1.7 mg/dL — ABNORMAL HIGH (ref 0.3–1.2)
Total Protein: 5.5 g/dL — ABNORMAL LOW (ref 6.5–8.1)

## 2018-03-21 LAB — URINALYSIS, COMPLETE (UACMP) WITH MICROSCOPIC
Bacteria, UA: NONE SEEN
Bilirubin Urine: NEGATIVE
Glucose, UA: >=500 mg/dL — AB
Ketones, ur: 20 mg/dL — AB
Leukocytes,Ua: NEGATIVE
NITRITE: NEGATIVE
Protein, ur: NEGATIVE mg/dL
SPECIFIC GRAVITY, URINE: 1.02 (ref 1.005–1.030)
pH: 5 (ref 5.0–8.0)

## 2018-03-21 LAB — CBC WITH DIFFERENTIAL/PLATELET
Abs Immature Granulocytes: 0.12 10*3/uL — ABNORMAL HIGH (ref 0.00–0.07)
Basophils Absolute: 0 10*3/uL (ref 0.0–0.1)
Basophils Relative: 1 %
Eosinophils Absolute: 0 10*3/uL (ref 0.0–0.5)
Eosinophils Relative: 0 %
HCT: 38.4 % — ABNORMAL LOW (ref 39.0–52.0)
HEMOGLOBIN: 11.7 g/dL — AB (ref 13.0–17.0)
Immature Granulocytes: 2 %
Lymphocytes Relative: 20 %
Lymphs Abs: 1.6 10*3/uL (ref 0.7–4.0)
MCH: 30 pg (ref 26.0–34.0)
MCHC: 30.5 g/dL (ref 30.0–36.0)
MCV: 98.5 fL (ref 80.0–100.0)
Monocytes Absolute: 0.7 10*3/uL (ref 0.1–1.0)
Monocytes Relative: 8 %
NRBC: 0 % (ref 0.0–0.2)
Neutro Abs: 5.5 10*3/uL (ref 1.7–7.7)
Neutrophils Relative %: 69 %
Platelets: 318 10*3/uL (ref 150–400)
RBC: 3.9 MIL/uL — ABNORMAL LOW (ref 4.22–5.81)
RDW: 14 % (ref 11.5–15.5)
WBC: 7.9 10*3/uL (ref 4.0–10.5)

## 2018-03-21 LAB — URINE DRUG SCREEN, QUALITATIVE (ARMC ONLY)
Amphetamines, Ur Screen: NOT DETECTED
Barbiturates, Ur Screen: NOT DETECTED
Benzodiazepine, Ur Scrn: NOT DETECTED
Cannabinoid 50 Ng, Ur ~~LOC~~: NOT DETECTED
Cocaine Metabolite,Ur ~~LOC~~: NOT DETECTED
MDMA (ECSTASY) UR SCREEN: NOT DETECTED
Methadone Scn, Ur: NOT DETECTED
Opiate, Ur Screen: NOT DETECTED
Phencyclidine (PCP) Ur S: NOT DETECTED
TRICYCLIC, UR SCREEN: NOT DETECTED

## 2018-03-21 LAB — PHOSPHORUS: Phosphorus: 3.8 mg/dL (ref 2.5–4.6)

## 2018-03-21 LAB — PROTIME-INR
INR: 1 (ref 0.8–1.2)
Prothrombin Time: 13.4 seconds (ref 11.4–15.2)

## 2018-03-21 LAB — INFLUENZA PANEL BY PCR (TYPE A & B)
INFLAPCR: NEGATIVE
Influenza B By PCR: NEGATIVE

## 2018-03-21 LAB — LACTIC ACID, PLASMA
LACTIC ACID, VENOUS: 1.7 mmol/L (ref 0.5–1.9)
Lactic Acid, Venous: 1.6 mmol/L (ref 0.5–1.9)

## 2018-03-21 LAB — PROCALCITONIN: Procalcitonin: 0.34 ng/mL

## 2018-03-21 LAB — ETHANOL: Alcohol, Ethyl (B): <10 mg/dL (ref <10)

## 2018-03-21 LAB — LIPASE, BLOOD: Lipase: 21 U/L (ref 11–51)

## 2018-03-21 LAB — BRAIN NATRIURETIC PEPTIDE: B Natriuretic Peptide: 374 pg/mL — ABNORMAL HIGH (ref 0.0–100.0)

## 2018-03-21 LAB — MAGNESIUM: MAGNESIUM: 2.9 mg/dL — AB (ref 1.7–2.4)

## 2018-03-21 MED ORDER — LACTATED RINGERS IV SOLN: INTRAVENOUS | Status: DC

## 2018-03-21 MED ORDER — THIAMINE HCL 100 MG/ML IJ SOLN
Freq: Once | INTRAVENOUS | Status: AC
  Administered 2018-03-21: 12:00:00 via INTRAVENOUS
  Filled 2018-03-21: qty 1000

## 2018-03-21 MED ORDER — SODIUM CHLORIDE 0.9 % IV BOLUS (SEPSIS)
1000.0000 mL | Freq: Once | INTRAVENOUS | Status: AC
  Administered 2018-03-21: 1000 mL via INTRAVENOUS

## 2018-03-21 MED ORDER — VANCOMYCIN HCL IN DEXTROSE 750-5 MG/150ML-% IV SOLN: 750.0000 mg | INTRAVENOUS | Status: DC

## 2018-03-21 MED ORDER — ADULT MULTIVITAMIN W/MINERALS CH
1.0000 | ORAL_TABLET | Freq: Every day | ORAL | Status: DC
  Administered 2018-03-22 – 2018-03-24 (×3): 1 via ORAL
  Filled 2018-03-21 (×3): qty 1

## 2018-03-21 MED ORDER — PIPERACILLIN-TAZOBACTAM 3.375 G IVPB 30 MIN: 3.3750 g | Freq: Four times a day (QID) | INTRAVENOUS | Status: DC

## 2018-03-21 MED ORDER — SODIUM CHLORIDE 0.9 % IV BOLUS (SEPSIS): 250.0000 mL | Freq: Once | INTRAVENOUS | Status: DC

## 2018-03-21 MED ORDER — ENOXAPARIN SODIUM 40 MG/0.4ML ~~LOC~~ SOLN
40.0000 mg | Freq: Every day | SUBCUTANEOUS | Status: DC
  Administered 2018-03-22 – 2018-03-24 (×3): 40 mg via SUBCUTANEOUS
  Filled 2018-03-21 (×4): qty 0.4

## 2018-03-21 MED ORDER — KCL IN DEXTROSE-NACL 20-5-0.45 MEQ/L-%-% IV SOLN
INTRAVENOUS | Status: DC
  Administered 2018-03-21: 20:00:00 via INTRAVENOUS
  Filled 2018-03-21 (×2): qty 1000

## 2018-03-21 MED ORDER — ATORVASTATIN CALCIUM 20 MG PO TABS
40.0000 mg | ORAL_TABLET | Freq: Every day | ORAL | Status: DC
  Administered 2018-03-22 – 2018-03-23 (×2): 40 mg via ORAL
  Filled 2018-03-21 (×2): qty 2

## 2018-03-21 MED ORDER — THIAMINE HCL 100 MG/ML IJ SOLN
Freq: Once | INTRAVENOUS | Status: AC
  Administered 2018-03-21: 21:00:00 via INTRAVENOUS
  Filled 2018-03-21: qty 1000

## 2018-03-21 MED ORDER — CHLORHEXIDINE GLUCONATE CLOTH 2 % EX PADS
6.0000 | MEDICATED_PAD | Freq: Once | CUTANEOUS | Status: AC
  Administered 2018-03-22: 6 via TOPICAL

## 2018-03-21 MED ORDER — DEXTROSE-NACL 5-0.45 % IV SOLN: INTRAVENOUS | Status: DC

## 2018-03-21 MED ORDER — ENOXAPARIN SODIUM 40 MG/0.4ML ~~LOC~~ SOLN: 30.0000 mg | SUBCUTANEOUS | Status: DC

## 2018-03-21 MED ORDER — INSULIN REGULAR(HUMAN) IN NACL 100-0.9 UT/100ML-% IV SOLN
INTRAVENOUS | Status: DC
  Administered 2018-03-21: 10.8 [IU]/h via INTRAVENOUS
  Administered 2018-03-21: 15.8 [IU]/h via INTRAVENOUS
  Filled 2018-03-21 (×2): qty 100

## 2018-03-21 MED ORDER — BACLOFEN 10 MG PO TABS
10.0000 mg | ORAL_TABLET | Freq: Two times a day (BID) | ORAL | Status: DC | PRN
  Filled 2018-03-21: qty 1

## 2018-03-21 MED ORDER — VANCOMYCIN HCL 10 G IV SOLR
1750.0000 mg | Freq: Once | INTRAVENOUS | Status: AC
  Administered 2018-03-21: 1750 mg via INTRAVENOUS
  Filled 2018-03-21: qty 1750

## 2018-03-21 MED ORDER — METRONIDAZOLE IN NACL 5-0.79 MG/ML-% IV SOLN
500.0000 mg | Freq: Once | INTRAVENOUS | Status: AC
  Administered 2018-03-21: 500 mg via INTRAVENOUS
  Filled 2018-03-21: qty 100

## 2018-03-21 MED ORDER — SODIUM CHLORIDE 0.9 % IV SOLN
INTRAVENOUS | Status: DC
  Administered 2018-03-21 (×2): via INTRAVENOUS

## 2018-03-21 MED ORDER — HYDROCORTISONE NA SUCCINATE PF 100 MG IJ SOLR
100.0000 mg | Freq: Once | INTRAMUSCULAR | Status: AC
  Administered 2018-03-21: 100 mg via INTRAVENOUS
  Filled 2018-03-21: qty 2

## 2018-03-21 MED ORDER — POTASSIUM CHLORIDE 2 MEQ/ML IV SOLN
INTRAVENOUS | Status: DC
  Administered 2018-03-21: 16:00:00 via INTRAVENOUS
  Filled 2018-03-21 (×2): qty 1000

## 2018-03-21 MED ORDER — NOREPINEPHRINE BITARTRATE 1 MG/ML IV SOLN
0.0000 ug/min | INTRAVENOUS | Status: DC
  Filled 2018-03-21: qty 4

## 2018-03-21 MED ORDER — SODIUM CHLORIDE 0.9 % IV SOLN
2.0000 g | Freq: Once | INTRAVENOUS | Status: AC
  Administered 2018-03-21: 2 g via INTRAVENOUS
  Filled 2018-03-21: qty 2

## 2018-03-21 MED ORDER — ATORVASTATIN CALCIUM 10 MG PO TABS: 10.0000 mg | ORAL_TABLET | Freq: Every day | ORAL | Status: DC

## 2018-03-21 MED ORDER — PIPERACILLIN-TAZOBACTAM 3.375 G IVPB: 3.3750 g | Freq: Three times a day (TID) | INTRAVENOUS | Status: DC

## 2018-03-21 MED ORDER — SODIUM CHLORIDE 0.9 % IV BOLUS
1000.0000 mL | Freq: Once | INTRAVENOUS | Status: AC
  Administered 2018-03-21: 1000 mL via INTRAVENOUS

## 2018-03-21 MED ORDER — SODIUM CHLORIDE 0.9 % IV SOLN: INTRAVENOUS | Status: AC

## 2018-03-21 MED ORDER — INSULIN REGULAR(HUMAN) IN NACL 100-0.9 UT/100ML-% IV SOLN
INTRAVENOUS | Status: DC
  Administered 2018-03-21: 5.4 [IU]/h via INTRAVENOUS
  Filled 2018-03-21: qty 100

## 2018-03-21 MED ORDER — VENLAFAXINE HCL 37.5 MG PO TABS
37.5000 mg | ORAL_TABLET | Freq: Every day | ORAL | Status: DC
  Administered 2018-03-22 – 2018-03-24 (×3): 37.5 mg via ORAL
  Filled 2018-03-21 (×4): qty 1

## 2018-03-21 MED ORDER — VANCOMYCIN HCL IN DEXTROSE 1-5 GM/200ML-% IV SOLN: 1000.0000 mg | Freq: Once | INTRAVENOUS | Status: DC

## 2018-03-21 NOTE — Progress Notes (Signed)
Inpatient Diabetes Program Recommendations  AACE/ADA: New Consensus Statement on Inpatient Glycemic Control (2015)  Target Ranges:  Prepandial:   less than 140 mg/dL      Peak postprandial:   less than 180 mg/dL (1-2 hours)      Critically ill patients:  140 - 180 mg/dL   Lab Results  Component Value Date   GLUCAP >600 (HH) 03/21/2018   HGBA1C 12.0 (H) 12/16/2017    Review of Glycemic Control Results for ARKEL, CARTWRIGHT (MRN [id number]) as of 03/21/2018 13:25  Ref. Range 03/21/2018 03:14 03/21/2018 10:52  Glucose Latest Ref Range: 70 - 99 mg/dL 1,223 (HH) 905 (HH)   Diabetes history: DM 2 Outpatient Diabetes medications:  Lantus 15 units q HS, Novolog 5 units tid with meals Current orders for Inpatient glycemic control:  IV insulin/DKA order set  Inpatient Diabetes Program Recommendations:   Note blood sugar >1000 mg/dL on admit.  Patient had not been taking insulin prior to admit.  He had 2 hospital admissions last year with DKA as well. Patient is currently on IV insulin/DKA order set.  Will follow.   Thanks,  Adah Perl, RN, BC-ADM Inpatient Diabetes Coordinator Pager [phone] (8a-5p)

## 2018-03-21 NOTE — Progress Notes (Addendum)
Patient ID: Shawn [name], male   DOB: May 10, 1955, 63 y.o.   MRN: [id number]  [employer] PROGRESS NOTE  Shawn [name] DXA:[id number] DOB: 1955-05-07 DOA: 03/21/2018 PCP: [facility]  HPI/Subjective: Patient is lying in bed for the last few days becoming more confused as per the daughter at bedside.  He lives with his wife.  Nobody at home are sick.  Patient has not been eating at home.  Objective: Vitals:   03/21/18 1100 03/21/18 1200  BP: (!) 105/59 110/61  Pulse: 70 74  Resp: 20 13  Temp:  (!) 97.4 F (36.3 C)  SpO2: 96% 95%    Filed Weights   03/21/18 0310 03/21/18 0800  Weight: 68 kg 60.3 kg    ROS: Review of Systems  Unable to perform ROS: Acuity of condition  Respiratory: Positive for cough. Negative for shortness of breath.   Cardiovascular: Negative for chest pain.  Gastrointestinal: Negative for abdominal pain.   Exam: Physical Exam  Constitutional: He appears lethargic.  HENT:  Nose: No mucosal edema.  Mouth/Throat: No oropharyngeal exudate or posterior oropharyngeal edema.  Eyes: Pupils are equal, round, and reactive to light. Conjunctivae, EOM and lids are normal.  Neck: No JVD present. Carotid bruit is not present. No edema present. No thyroid mass and no thyromegaly present.  Cardiovascular: S1 normal and S2 normal. Exam reveals no gallop.  No murmur heard. Pulses:      Dorsalis pedis pulses are 2+ on the right side and 2+ on the left side.  Respiratory: No respiratory distress. He has decreased breath sounds in the right lower field and the left lower field. He has no wheezes. He has no rhonchi. He has no rales.  GI: Soft. Bowel sounds are normal. There is no abdominal tenderness.  Musculoskeletal:     Right ankle: He exhibits no swelling.     Left ankle: He exhibits no swelling.  Lymphadenopathy:    He has no cervical adenopathy.  Neurological: He appears lethargic.  Patient moving all of his extremities.  Skin: Skin is  warm. No rash noted. Nails show no clubbing.  Psychiatric:  Patient answers a few questions but goes back to sleep.      Data Reviewed: Basic Metabolic Panel: Recent Labs  Lab 03/21/18 0314 03/21/18 1052  NA 140 148*  K 5.3* 3.6  CL 99 108  CO2 11* 18*  GLUCOSE 1,223* 905*  BUN 71* 72*  CREATININE 2.54* 2.26*  CALCIUM 8.5* 8.3*  MG  --  2.9*  PHOS  --  3.8   Liver Function Tests: Recent Labs  Lab 03/21/18 0314  AST 26  ALT 19  ALKPHOS 108  BILITOT 1.7*  PROT 5.5*  ALBUMIN 2.9*   Recent Labs  Lab 03/21/18 0314  LIPASE 21   CBC: Recent Labs  Lab 03/21/18 0314  WBC 7.9  NEUTROABS 5.5  HGB 11.7*  HCT 38.4*  MCV 98.5  PLT 318   BNP (last 3 results) Recent Labs    04/29/17 0332 03/21/18 0314  BNP 317.0* 374.0*    CBG: Recent Labs  Lab 03/21/18 0723 03/21/18 0831 03/21/18 0937 03/21/18 1056 03/21/18 1200  GLUCAP >600* >600* >600* >600* >600*    Recent Results (from the past 240 hour(s))  Blood Culture (routine x 2)     Status: None (Preliminary result)   Collection Time: 03/21/18  3:14 AM  Result Value Ref Range Status   Specimen Description BLOOD BLOOD LEFT FOREARM  Final  Special Requests   Final    BOTTLES DRAWN AEROBIC AND ANAEROBIC Blood Culture results may not be optimal due to an inadequate volume of blood received in culture bottles   Culture   Final    NO GROWTH < 12 HOURS Performed at [facility], [address] [place], [place] [address]    Report Status PENDING  Incomplete  Blood Culture (routine x 2)     Status: None (Preliminary result)   Collection Time: 03/21/18  3:34 AM  Result Value Ref Range Status   Specimen Description BLOOD BLOOD RIGHT HAND  Final   Special Requests   Final    BOTTLES DRAWN AEROBIC AND ANAEROBIC Blood Culture results may not be optimal due to an excessive volume of blood received in culture bottles   Culture   Final    NO GROWTH < 12 HOURS Performed at [facility], [address] [place], [place] [address]    Report Status PENDING  Incomplete  MRSA PCR Screening     Status: None   Collection Time: 03/21/18  8:15 AM  Result Value Ref Range Status   MRSA by PCR NEGATIVE NEGATIVE Final    Comment:        The GeneXpert MRSA Assay (FDA approved for NASAL specimens only), is one component of a comprehensive MRSA colonization surveillance program. It is not intended to diagnose MRSA infection nor to guide or monitor treatment for MRSA infections. Performed at [facility], [address] [place], [place] [address]      Studies: Ct Head Wo Contrast  Result Date: 03/21/2018 CLINICAL DATA:  Unexplained altered level of consciousness EXAM: CT HEAD WITHOUT CONTRAST TECHNIQUE: Contiguous axial images were obtained from the base of the skull through the vertex without intravenous contrast. COMPARISON:  04/27/2017 FINDINGS: Brain: No evidence of acute infarction, hemorrhage, hydrocephalus, extra-axial collection or mass lesion/mass effect. Bilateral inferior frontal encephalomalacia, posttraumatic appearance. Generalized cerebral volume loss. Vascular: No hyperdense vessel or unexpected calcification. Skull: Normal. Negative for fracture or focal lesion. Sinuses/Orbits: No acute finding. IMPRESSION: 1. No acute finding or change from 11 months prior. 2. Bilateral inferior frontal encephalomalacia, posttraumatic appearance. Electronically Signed   By: Monte Fantasia M.D.   On: 03/21/2018 05:20   Ct Chest Wo Contrast  Result Date: 03/21/2018 CLINICAL DATA:  Altered mental status. Cough. EXAM: CT CHEST WITHOUT CONTRAST TECHNIQUE: Multidetector CT imaging of the chest was performed following the standard protocol without IV contrast. COMPARISON:  None. FINDINGS: Cardiovascular: Aortic and coronary artery calcific atherosclerosis. Mild cardiomegaly. No pericardial effusion. Normal course and caliber of the aorta. Mediastinum/Nodes: No mediastinal, axillary or  hilar lymphadenopathy. Normal thoracic esophageal course. Visualized thyroid is normal. Lungs/Pleura: There is biapical scarring and bibasilar atelectasis. No nodule or mass. No focal consolidation. No pleural effusion or pneumothorax. Upper Abdomen: No acute abnormality. Musculoskeletal: No chest wall mass or suspicious bone lesions identified. IMPRESSION: 1. No acute thoracic abnormality. 2. Mild cardiomegaly with coronary artery calcification and aortic atherosclerosis (ICD10-I70.0). Electronically Signed   By: Ulyses Jarred M.D.   On: 03/21/2018 05:34   Dg Chest Port 1 View  Result Date: 03/21/2018 CLINICAL DATA:  Cough EXAM: PORTABLE CHEST 1 VIEW COMPARISON:  12/15/2017 FINDINGS: Mild cardiomegaly. No focal airspace consolidation. No pleural effusion or pneumothorax. IMPRESSION: No active disease. Electronically Signed   By: Ulyses Jarred M.D.   On: 03/21/2018 04:08    Scheduled Meds: . atorvastatin  40 mg Oral QHS  . enoxaparin (LOVENOX) injection  30 mg Subcutaneous Q24H  . multivitamin with minerals  1 tablet Oral Daily  . venlafaxine  37.5 mg Oral Daily   Continuous Infusions: . sodium chloride 100 mL/hr at 03/21/18 0814  . dextrose 5 % and 0.45% NaCl    . dextrose 5 % and 0.45% NaCl    . insulin 16.2 Units/hr (03/21/18 1246)  . norepinephrine (LEVOPHED) Adult infusion    . sodium chloride      Assessment/Plan:  1. Diabetic ketoacidosis versus hyperosmolar coma.  Anion gap elevated.  Continue insulin drip and IV fluids.  Likely noncompliance with medication.  Recent A1c very elevated 2. Hypotension on presentation, hypothermic on presentation.  Patient was initially started on antibiotics but then they were stopped.  Follow-up cultures.  I ordered influenza which was negative.  Respiratory viral panel pending.  Would recommend starting antibiotics if patient spikes a fever.  CT scan of the chest negative for pneumonia. 3. Acute kidney injury on chronic kidney disease stage III.   Monitor with hydration 4. Hyperkalemia improved with hydration 5. Chronic systolic congestive heart failure with cardiomyopathy.  Continue to monitor fluid status closely. 6. Hyperlipidemia unspecified on atorvastatin 7. Anxiety on Effexor  Code Status:     Code Status Orders  (From admission, onward)         Start     Ordered   03/21/18 0558  Full code  Continuous     03/21/18 0601        Code Status History    Date Active Date Inactive Code Status Order ID Comments User Context   12/15/2017 2358 12/17/2017 1933 Full Code [id number]  Lance Coon, MD Inpatient   04/27/2017 0531 04/29/2017 2118 Full Code [id number]  Harrie Foreman, MD Inpatient     Family Communication: Daughter at the bedside Disposition Plan: To be determined  Consultants:  Critical care specialist  Time spent: 35 minutes  [place]

## 2018-03-21 NOTE — Plan of Care (Signed)
Admitted to ICU 13, ins gtt infusing, lethargic but responsive, oriented to self only, no signs infection, sister at bedside and informed of POC

## 2018-03-21 NOTE — ED Triage Notes (Signed)
Pt arrives via ems due to AMS per wife. EMS stated that wife had stated that pt has not been acting like himself for the past 3 days. EMS pt was A&OX1 and was found to have elevated glucose . On arrival to ED pt is moaning and will answer some questions. Pt becomes more alert with physical stimuli.

## 2018-03-21 NOTE — H&P (Addendum)
[facility] [place] NAME: Shawn [name]    MR#:  [id number]  DATE OF BIRTH:  03/21/1955  DATE OF ADMISSION:  03/21/2018  PRIMARY CARE PHYSICIAN: [facility] [place]   REQUESTING/REFERRING PHYSICIAN: Les Pou, MD CHIEF COMPLAINT:   Chief Complaint  Patient presents with  . Hyperglycemia  . Altered Mental Status    HISTORY OF PRESENT ILLNESS:  Shawn [name]  is a 63 y.o. male with a known history of diabetes mellitus, hypertension, systolic CHF and nonischemic cardiomyopathy, polysubstance abuse and CVA as well as stage II chronic kidney disease who presented to the emergency room with acute onset of hyperglycemia and altered mental status.  He has missed his insulin since Saturday night.  No nausea or vomiting or abdominal pain or diarrhea.  He has not been drinking much fluids per his wife.  He was noted to be shaking so bad when she called EMS.  She stated that he has been having dry cough from dry mouth and therefore is been having polydipsia with associated polyuria.  No dysuria or hematuria or urgency or flank pain.  No wheezing or dyspnea.  No rhinorrhea or nasal congestion or sore throat.  No chest pain or palpitations.  Upon presentation emergency room, he was hypotensive with a blood pressure of 83/53 with a pulse of 69 respiratory of 16 and hypothermia with temperature of 93.3 with a pulse currently of 90% on room air.  Labs revealed blood glucose of 1223 and a sodium of 140 with anion gap of 30, BUN of 71 and creatinine of 2.54 up from 63 and 1.33 on 12/17/2017.  His CO2 was 11 compared to 30 then potassium was 5.3 compared to 4.1 then with a chloride of 99.  His BNP was 374.  Lactic acid level came back 1.7 procalcitonin 0.34 urinalysis was unremarkable and portable chest x-ray showed no acute cardiopulmonary disease.  Chest CT revealed mild cardiomegaly with coronary artery calcification and aortic atherosclerosis with  no acute thoracic abnormality.  Noncontrasted head CT scan revealed bilateral inferior frontal encephalomalacia, posttraumatic appearance with no acute findings otherwise.  The patient was given 3 L bolus of IV normal saline and systolic blood pressure was still 89/54.  He was also given 2 g of IV cefepime, IV vancomycin and Flagyl on an empiric basis and initially 100 mg of hydrocortisone for concern about sepsis before lactic acid and procalcitonin levels were back, given the patient's hypotension as well as hypothermia.  He was placed on [facility].  He will be admitted to an ICU bed for further evaluation and management PAST MEDICAL HISTORY:   Past Medical History:  Diagnosis Date  . Chronic combined systolic (congestive) and diastolic (congestive) heart failure ([place])    a. TTE 6/15: EF < 20%, mildly dilated LV, DD, mildly dilated LA, mod dilated RA, mild MR, mild to mod TR, mod increased posterior wall thickness, elevated LA and LVEDP; b. 4/12019 Echo: EF 20-25%, diff HK. Gr1 DD, nl RV fxn.  . CKD (chronic kidney disease), stage II   . Diabetes mellitus with complication ([place])    a. Prior admissions w/ DKA (last 12/2017).  . Essential hypertension   . NICM (nonischemic cardiomyopathy) ([place])    a. 06/2013 Echo: EF<20%; b. 06/2013 Cath: no signif dzs; c. 04/2017 Echo: 20-25%, gr1 DD.  Marland Kitchen Polysubstance abuse ([place])    a. etoh and tobacco  . Stroke ([place])     PAST SURGICAL HISTORY:  Past Surgical History:  Procedure Laterality Date  . NO PAST SURGERIES      SOCIAL HISTORY:   Social History   Tobacco Use  . Smoking status: Current Every Day Smoker    Packs/day: 0.50    Types: Cigarettes  . Smokeless tobacco: Never Used  Substance Use Topics  . Alcohol use: Not Currently    Frequency: Never    FAMILY HISTORY:   Family History  Problem Relation Age of Onset  . Congestive Heart Failure Mother   . Diabetes Mother   . Congestive Heart Failure Brother     DRUG ALLERGIES:  No  Known Allergies  REVIEW OF SYSTEMS:   ROS As per history of present illness. All pertinent systems were reviewed above. Constitutional,  HEENT, cardiovascular, respiratory, GI, GU, musculoskeletal, neuro, psychiatric, endocrine,  integumentary and hematologic systems were reviewed and are otherwise  negative/unremarkable except for positive findings mentioned above in the HPI.   MEDICATIONS AT HOME:   Prior to Admission medications   Medication Sig Start Date End Date Taking? Authorizing Provider  aspirin 81 MG chewable tablet Chew 1 tablet (81 mg total) by mouth daily. 04/30/17   Demetrios Loll, MD  atorvastatin (LIPITOR) 20 MG tablet Take 10 mg by mouth at bedtime.    [provider]  baclofen (LIORESAL) 10 MG tablet Take 10 mg by mouth 2 (two) times daily as needed for muscle spasms. Muscle spasm    [provider]  carvedilol (COREG) 12.5 MG tablet Take 12.5 mg by mouth 2 (two) times daily with a meal.    [provider]  cetirizine (ZYRTEC) 10 MG tablet TAKE 1 TABLET BY MOUTH DAILY 10/27/17   Minna Merritts, MD  furosemide (LASIX) 40 MG tablet Take 40 mg by mouth 2 (two) times daily.    [provider]  hydrALAZINE (APRESOLINE) 100 MG tablet Take 100 mg by mouth 2 (two) times daily.    [provider]  insulin aspart (NOVOLOG) 100 UNIT/ML injection Inject 5 Units into the skin 3 (three) times daily with meals.    [provider]  insulin glargine (LANTUS) 100 UNIT/ML injection Inject 15 Units into the skin at bedtime.    [provider]  Multiple Vitamin (MULTIVITAMIN WITH MINERALS) TABS tablet Take 1 tablet by mouth daily. 04/30/17   Demetrios Loll, MD  sacubitril-valsartan (ENTRESTO) 97-103 MG Take 1 tablet by mouth 2 (two) times daily. 03/14/18   Theora Gianotti, NP  spironolactone (ALDACTONE) 25 MG tablet Take 25 mg by mouth at bedtime.    [provider]  venlafaxine (EFFEXOR) 37.5 MG tablet Take 37.5 mg by  mouth daily.    [provider]      VITAL SIGNS:  Blood pressure (!) 89/54, pulse 71, temperature (!) 94 F (34.4 C), resp. rate 20, height 5\' 11"  (1.803 m), weight 68 kg, SpO2 99 %.  PHYSICAL EXAMINATION:  Physical Exam  GENERAL:  63 y.o.-year-old African-American male patient lying in the bed with no acute distress.  EYES: Pupils equal, round, reactive to light and accommodation. No scleral icterus. Extraocular muscles intact.  HEENT: Head atraumatic, normocephalic. Oropharynx with dry mucous membrane and tongue and nasopharynx clear.  NECK:  Supple, no jugular venous distention. No thyroid enlargement, no tenderness.  LUNGS: Normal breath sounds bilaterally, no wheezing, rales,rhonchi or crepitation. No use of accessory muscles of respiration.  CARDIOVASCULAR: S1, S2 normal. No murmurs, rubs, or gallops.  ABDOMEN: Soft, nontender, nondistended. Bowel sounds present. No organomegaly or  mass.  EXTREMITIES: No pedal edema, cyanosis, or clubbing.  NEUROLOGIC: Cranial nerves II through XII are intact. Muscle strength 5/5 in all extremities. Sensation intact. Gait not checked.  PSYCHIATRIC: The patient is alert and oriented x 3.  SKIN: No obvious rash, lesion, or ulcer.   LABORATORY PANEL:   CBC Recent Labs  Lab 03/21/18 0314  WBC 7.9  HGB 11.7*  HCT 38.4*  PLT 318   ------------------------------------------------------------------------------------------------------------------  Chemistries  Recent Labs  Lab 03/21/18 0314  NA 140  K 5.3*  CL 99  CO2 11*  GLUCOSE 1,223*  BUN 71*  CREATININE 2.54*  CALCIUM 8.5*  AST 26  ALT 19  ALKPHOS 108  BILITOT 1.7*   ------------------------------------------------------------------------------------------------------------------  Cardiac Enzymes No results for input(s): TROPONINI in the last 168 hours.  ------------------------------------------------------------------------------------------------------------------  RADIOLOGY:  Ct Head Wo Contrast  Result Date: 03/21/2018 CLINICAL DATA:  Unexplained altered level of consciousness EXAM: CT HEAD WITHOUT CONTRAST TECHNIQUE: Contiguous axial images were obtained from the base of the skull through the vertex without intravenous contrast. COMPARISON:  04/27/2017 FINDINGS: Brain: No evidence of acute infarction, hemorrhage, hydrocephalus, extra-axial collection or mass lesion/mass effect. Bilateral inferior frontal encephalomalacia, posttraumatic appearance. Generalized cerebral volume loss. Vascular: No hyperdense vessel or unexpected calcification. Skull: Normal. Negative for fracture or focal lesion. Sinuses/Orbits: No acute finding. IMPRESSION: 1. No acute finding or change from 11 months prior. 2. Bilateral inferior frontal encephalomalacia, posttraumatic appearance. Electronically Signed   By: Monte Fantasia M.D.   On: 03/21/2018 05:20   Ct Chest Wo Contrast  Result Date: 03/21/2018 CLINICAL DATA:  Altered mental status. Cough. EXAM: CT CHEST WITHOUT CONTRAST TECHNIQUE: Multidetector CT imaging of the chest was performed following the standard protocol without IV contrast. COMPARISON:  None. FINDINGS: Cardiovascular: Aortic and coronary artery calcific atherosclerosis. Mild cardiomegaly. No pericardial effusion. Normal course and caliber of the aorta. Mediastinum/Nodes: No mediastinal, axillary or hilar lymphadenopathy. Normal thoracic esophageal course. Visualized thyroid is normal. Lungs/Pleura: There is biapical scarring and bibasilar atelectasis. No nodule or mass. No focal consolidation. No pleural effusion or pneumothorax. Upper Abdomen: No acute abnormality. Musculoskeletal: No chest wall mass or suspicious bone lesions identified. IMPRESSION: 1. No acute thoracic abnormality. 2. Mild cardiomegaly with coronary artery calcification and aortic  atherosclerosis (ICD10-I70.0). Electronically Signed   By: Ulyses Jarred M.D.   On: 03/21/2018 05:34   Dg Chest Port 1 View  Result Date: 03/21/2018 CLINICAL DATA:  Cough EXAM: PORTABLE CHEST 1 VIEW COMPARISON:  12/15/2017 FINDINGS: Mild cardiomegaly. No focal airspace consolidation. No pleural effusion or pneumothorax. IMPRESSION: No active disease. Electronically Signed   By: Ulyses Jarred M.D.   On: 03/21/2018 04:08      IMPRESSION AND PLAN:   1. DKA.  The patient has subsequent acute metabolic encephalopathy secondary to significant hyperglycemia.The patient will be admitted to an ICU bed and will be was placed on IV insulin drip per DKA protocol.  The patient will be aggressively hydrated with IV normal saline.  Will follow serial BMPs.  2.  Hypotension.  This could be related to hypovolemic shock.  His lactic acid and procalcitonin were within normal and his CBC showed no leukocytosis.  The patient is hypothermic however therefore he could be having SIRS.Marland Kitchen  He received 3 L of normal saline and IV cefepime, vancomycin and Flagyl so far and was still hypotensive.  We will start him on IV Levophed drip.  I will empirically continue him on IV vancomycin and Zosyn.  3.  Acute kidney injury.  This is likely secondary to volume depletion and dehydration.  The patient was aggressively hydrated and his BMP will be followed.  4.  Mild hyperkalemia.  This should correct with IV insulin drip.  5.  Dyslipidemia.  Statin therapy will be continued.  6.  Chronic systolic CHF with cardiomyopathy.  No current exacerbation.  He will be monitored for exacerbation with hydration.  7.  DVT prophylaxis.  Subcutaneous Lovenox.   All the records are reviewed and case discussed with ED provider.  I have also discussed this case with the on-call intensivist NP,Shawn [name], and contacted Dr. Lanney Gins with patient's information. Management plans discussed with the patient and his wife and they are in agreement.   All questions were answered.  CODE STATUS: Full code   TOTAL CRITICAL CARE TIME TAKING CARE OF THIS PATIENT: 50 minutes.    Christel Mormon M.D on 03/21/2018 at 6:02 AM  Pager - [phone]  After 6pm go to [email or web address] - Proofreader  [facility]  Office  [phone]  CC: Primary care physician; [place]   Note: This dictation was prepared with Dragon dictation along with smaller phrase technology. Any transcriptional errors that result from this process are unintentional.

## 2018-03-21 NOTE — ED Provider Notes (Addendum)
[facility] Emergency Department Provider Note  ____________________________________________   First MD Initiated Contact with Patient 03/21/18 [phone]     (approximate)  I have reviewed the triage vital signs and the nursing notes.   HISTORY  Chief Complaint Hyperglycemia and Altered Mental Status  Level 5 caveat:  history/ROS limited by acute/critical illness  HPI Shawn [name] is a 63 y.o. male with extensive medical history as listed below  who presents for evaluation of altered mental status and hyperglycemia.  According to the paramedics the wife is on the way to provide additional details, but the patient has not been taking his insulin and has been acting increasingly and confused over at least the last 2 days.  The patient has no current complaints but appears chronically ill.  He is somnolent but awakens to light voice.  He denies chest pain, shortness of breath, and abdominal pain.  He does not appear to have any acute neurological deficits.  His symptoms are severe.  No other details are provided.        Past Medical History:  Diagnosis Date  . Chronic combined systolic (congestive) and diastolic (congestive) heart failure ([place])    a. TTE 6/15: EF < 20%, mildly dilated LV, DD, mildly dilated LA, mod dilated RA, mild MR, mild to mod TR, mod increased posterior wall thickness, elevated LA and LVEDP; b. 4/12019 Echo: EF 20-25%, diff HK. Gr1 DD, nl RV fxn.  . CKD (chronic kidney disease), stage II   . Diabetes mellitus with complication ([place])    a. Prior admissions w/ DKA (last 12/2017).  . Essential hypertension   . NICM (nonischemic cardiomyopathy) ([place])    a. 06/2013 Echo: EF<20%; b. 06/2013 Cath: no signif dzs; c. 04/2017 Echo: 20-25%, gr1 DD.  Marland Kitchen Polysubstance abuse ([place])    a. etoh and tobacco  . Stroke [facility])     Patient Active Problem List   Diagnosis Date Noted  . Chronic systolic CHF (congestive heart failure) ([place]) 12/15/2017  .  NICM (nonischemic cardiomyopathy) ([place]) 05/13/2017  . Type 2 diabetes mellitus with complication, with long-term current use of insulin ([place]) 05/13/2017  . Smoker 05/13/2017  . Polysubstance abuse ([place]) 05/13/2017  . Alcohol abuse 05/13/2017  . DKA (diabetic ketoacidoses) ([place]) 04/27/2017    Past Surgical History:  Procedure Laterality Date  . NO PAST SURGERIES      Prior to Admission medications   Medication Sig Start Date End Date Taking? Authorizing Provider  aspirin 81 MG chewable tablet Chew 1 tablet (81 mg total) by mouth daily. 04/30/17   Demetrios Loll, MD  atorvastatin (LIPITOR) 20 MG tablet Take 10 mg by mouth at bedtime.    [provider]  baclofen (LIORESAL) 10 MG tablet Take 10 mg by mouth 2 (two) times daily as needed for muscle spasms. Muscle spasm    [provider]  carvedilol (COREG) 12.5 MG tablet Take 12.5 mg by mouth 2 (two) times daily with a meal.    [provider]  cetirizine (ZYRTEC) 10 MG tablet TAKE 1 TABLET BY MOUTH DAILY 10/27/17   Minna Merritts, MD  furosemide (LASIX) 40 MG tablet Take 40 mg by mouth 2 (two) times daily.    [provider]  hydrALAZINE (APRESOLINE) 100 MG tablet Take 100 mg by mouth 2 (two) times daily.    [provider]  insulin aspart (NOVOLOG) 100 UNIT/ML injection Inject 5 Units into the skin 3 (three) times daily with meals.  [provider]  insulin glargine (LANTUS) 100 UNIT/ML injection Inject 15 Units into the skin at bedtime.    [provider]  Multiple Vitamin (MULTIVITAMIN WITH MINERALS) TABS tablet Take 1 tablet by mouth daily. 04/30/17   Demetrios Loll, MD  sacubitril-valsartan (ENTRESTO) 97-103 MG Take 1 tablet by mouth 2 (two) times daily. 03/14/18   Theora Gianotti, NP  spironolactone (ALDACTONE) 25 MG tablet Take 25 mg by mouth at bedtime.    [provider]  venlafaxine (EFFEXOR) 37.5 MG tablet Take 37.5 mg by mouth daily.    [provider]    Allergies Patient has no known allergies.  Family History  Problem Relation Age of Onset  . Congestive Heart Failure Mother   . Diabetes Mother   . Congestive Heart Failure Brother     Social History Social History   Tobacco Use  . Smoking status: Current Every Day Smoker    Packs/day: 0.50    Types: Cigarettes  . Smokeless tobacco: Never Used  Substance Use Topics  . Alcohol use: Not Currently    Frequency: Never  . Drug use: Not on file    Review of Systems Level 5 caveat:  history/ROS limited by acute/critical illness ____________________________________________   PHYSICAL EXAM:  VITAL SIGNS: ED Triage Vitals  Enc Vitals Group     BP 03/21/18 0314 (!) 83/53     Pulse Rate 03/21/18 0314 69     Resp 03/21/18 0314 16     Temp 03/21/18 0314 (!) 93.3 F (34.1 C)     Temp Source 03/21/18 0314 Rectal     SpO2 03/21/18 0314 98 %     Weight 03/21/18 0310 68 kg (150 lb)     Height 03/21/18 0310 1.803 m (5\' 11" )     Head Circumference --      Peak Flow --      Pain Score 03/21/18 0310 0     Pain Loc --      Pain Edu? --      Excl. in [place]? --     Constitutional: Awake but altered, unable to provide history.  Appears chronically ill. Eyes: Conjunctivae are normal.  Head: Atraumatic. Nose: No congestion/rhinnorhea. Mouth/Throat: Mucous membranes are dry. Neck: No stridor.  No meningeal signs.   Cardiovascular: Normal rate, regular rhythm. Good peripheral circulation. Grossly normal heart sounds. Respiratory: Normal respiratory effort.  No retractions. Lungs CTAB. Gastrointestinal: Thin body habitus consistent with chronic illness.  Soft and nontender. No distention.  Musculoskeletal: No lower extremity tenderness nor edema. No gross deformities of extremities. Neurologic:  Normal speech and language. No gross focal neurologic deficits are appreciated.  Skin:  Skin is warm, dry and intact. No rash noted. Psychiatric: The patient is confused and  irritable but in no distress.  Unable to write history.  ____________________________________________   LABS (all labs ordered are listed, but only abnormal results are displayed)  Labs Reviewed  GLUCOSE, CAPILLARY - Abnormal; Notable for the following components:      Result Value   Glucose-Capillary >600 (*)    All other components within normal limits  GLUCOSE, CAPILLARY - Abnormal; Notable for the following components:   Glucose-Capillary >600 (*)    All other components within normal limits  COMPREHENSIVE METABOLIC PANEL - Abnormal; Notable for the following components:   Potassium 5.3 (*)    CO2 11 (*)    Glucose, Bld 1,223 (*)    BUN 71 (*)    Creatinine, Ser 2.54 (*)  Calcium 8.5 (*)    Total Protein 5.5 (*)    Albumin 2.9 (*)    Total Bilirubin 1.7 (*)    GFR calc non Af Amer 26 (*)    GFR calc Af Amer 30 (*)    Anion gap 30 (*)    All other components within normal limits  CBC WITH DIFFERENTIAL/PLATELET - Abnormal; Notable for the following components:   RBC 3.90 (*)    Hemoglobin 11.7 (*)    HCT 38.4 (*)    Abs Immature Granulocytes 0.12 (*)    All other components within normal limits  BLOOD GAS, VENOUS - Abnormal; Notable for the following components:   pH, Ven 7.21 (*)    pCO2, Ven 26 (*)    pO2, Ven 84.0 (*)    Bicarbonate 10.4 (*)    Acid-base deficit 15.8 (*)    All other components within normal limits  BRAIN NATRIURETIC PEPTIDE - Abnormal; Notable for the following components:   B Natriuretic Peptide 374.0 (*)    All other components within normal limits  URINALYSIS, COMPLETE (UACMP) WITH MICROSCOPIC - Abnormal; Notable for the following components:   Color, Urine STRAW (*)    APPearance CLEAR (*)    Glucose, UA >=500 (*)    Hgb urine dipstick SMALL (*)    Ketones, ur 20 (*)    All other components within normal limits  CULTURE, BLOOD (ROUTINE X 2)  CULTURE, BLOOD (ROUTINE X 2)  URINE CULTURE  LACTIC ACID, PLASMA  LIPASE, BLOOD   PROCALCITONIN  PROTIME-INR  LACTIC ACID, PLASMA  BETA-HYDROXYBUTYRIC ACID  URINE DRUG SCREEN, QUALITATIVE ([facility] ONLY)  ETHANOL  MAGNESIUM  PHOSPHORUS  BASIC METABOLIC PANEL  BASIC METABOLIC PANEL  BASIC METABOLIC PANEL  BASIC METABOLIC PANEL   ____________________________________________  EKG  ED ECG REPORT I, Hinda Kehr, the attending physician, personally viewed and interpreted this ECG.  Date: 03/21/2018 EKG Time: 3:16 AM Rate: 69 Rhythm: normal sinus rhythm QRS Axis: normal Intervals: normal ST/T Wave abnormalities: Non-specific ST segment / T-wave changes, but no clear evidence of acute ischemia. Narrative Interpretation: no definitive evidence of acute ischemia; does not meet STEMI criteria.   ____________________________________________  RADIOLOGY I, Hinda Kehr, personally viewed and evaluated these images (plain radiographs) as part of my medical decision making, as well as reviewing the written report by the radiologist.  ED MD interpretation: No acute abnormalities on CT head or CT chest.  No evidence of pneumonia or pulmonary edema on chest x-ray.  Official radiology report(s): Ct Head Wo Contrast  Result Date: 03/21/2018 CLINICAL DATA:  Unexplained altered level of consciousness EXAM: CT HEAD WITHOUT CONTRAST TECHNIQUE: Contiguous axial images were obtained from the base of the skull through the vertex without intravenous contrast. COMPARISON:  04/27/2017 FINDINGS: Brain: No evidence of acute infarction, hemorrhage, hydrocephalus, extra-axial collection or mass lesion/mass effect. Bilateral inferior frontal encephalomalacia, posttraumatic appearance. Generalized cerebral volume loss. Vascular: No hyperdense vessel or unexpected calcification. Skull: Normal. Negative for fracture or focal lesion. Sinuses/Orbits: No acute finding. IMPRESSION: 1. No acute finding or change from 11 months prior. 2. Bilateral inferior frontal encephalomalacia, posttraumatic  appearance. Electronically Signed   By: Monte Fantasia M.D.   On: 03/21/2018 05:20   Ct Chest Wo Contrast  Result Date: 03/21/2018 CLINICAL DATA:  Altered mental status. Cough. EXAM: CT CHEST WITHOUT CONTRAST TECHNIQUE: Multidetector CT imaging of the chest was performed following the standard protocol without IV contrast. COMPARISON:  None. FINDINGS: Cardiovascular: Aortic and coronary artery calcific atherosclerosis. Mild  cardiomegaly. No pericardial effusion. Normal course and caliber of the aorta. Mediastinum/Nodes: No mediastinal, axillary or hilar lymphadenopathy. Normal thoracic esophageal course. Visualized thyroid is normal. Lungs/Pleura: There is biapical scarring and bibasilar atelectasis. No nodule or mass. No focal consolidation. No pleural effusion or pneumothorax. Upper Abdomen: No acute abnormality. Musculoskeletal: No chest wall mass or suspicious bone lesions identified. IMPRESSION: 1. No acute thoracic abnormality. 2. Mild cardiomegaly with coronary artery calcification and aortic atherosclerosis (ICD10-I70.0). Electronically Signed   By: Ulyses Jarred M.D.   On: 03/21/2018 05:34   Dg Chest Port 1 View  Result Date: 03/21/2018 CLINICAL DATA:  Cough EXAM: PORTABLE CHEST 1 VIEW COMPARISON:  12/15/2017 FINDINGS: Mild cardiomegaly. No focal airspace consolidation. No pleural effusion or pneumothorax. IMPRESSION: No active disease. Electronically Signed   By: Ulyses Jarred M.D.   On: 03/21/2018 04:08    ____________________________________________   PROCEDURES   Procedure(s) performed (including Critical Care):  .Critical Care Performed by: Hinda Kehr, MD Authorized by: Hinda Kehr, MD   Critical care provider statement:    Critical care time (minutes):  45   Critical care time was exclusive of:  Separately billable procedures and treating other patients   Critical care was necessary to treat or prevent imminent or life-threatening deterioration of the following  conditions:  Metabolic crisis and endocrine crisis   Critical care was time spent personally by me on the following activities:  Development of treatment plan with patient or surrogate, discussions with consultants, evaluation of patient's response to treatment, examination of patient, obtaining history from patient or surrogate, ordering and performing treatments and interventions, ordering and review of laboratory studies, ordering and review of radiographic studies, pulse oximetry, re-evaluation of patient's condition and review of old charts     ____________________________________________   [place] / MDM / [place] / ED COURSE  As part of my medical decision making, I reviewed the following data within the electronic MEDICAL RECORD NUMBER History obtained from family, Nursing notes reviewed and incorporated, Labs reviewed , EKG interpreted , Old chart reviewed, Radiograph reviewed , Discussed with admitting physician (Dr. Lodema Pilot) and Notes from prior ED visits         Differential diagnosis includes, but is not limited to, sepsis, hyperglycemic hyperosmotic nonketotic state/coma, DKA, pneumonia, urinary tract infection, substance abuse, CVA or intracranial hemorrhage.  The patient reports no pain and will orient briefly to answer simple questions but is clearly altered.  He has the appearance of chronic illness but is not in distress but does appear acutely ill.  His blood sugar was greater than 600 on the fingerstick testing.  He is profoundly hypothermic with a body temperature of 93 degrees.  I am going to err on the side of caution and initiate code sepsis including broad-spectrum antibiotics of cefepime 2 g IV, metronidazole 500 mg IV, and vancomycin 1 g IV.  I am ordering 30 mL/kg of fluids because he is also hypotensive.  I did review his medical record and see that he has an ejection fraction of about 20% which is concerning to me but he is hypertensive and appears dry  and hyperglycemic so he needs the volume.  I will talk with his wife when she arrives.  He has no tenderness to palpation of the abdomen but he does occasionally cough so this may be a pneumonia or even an aspiration.  EKG shows no signs of ischemia.  Clinical Course as of Mar 20 604  Tue Mar 21, 2018  0431  I discussed the case with the patient's wife at bedside.  She said that he has been refusing to take his insulin for the last couple of days and that he has been very confused for at least last 24 hours.  He has not had any specific complaints but he has not been making a lot of sense since he stopped taking his insulin.  She said he is not on any chronic steroids and has not had any specific complaints except for mild cough recently.  He became very agitated when I was in the room because of the rectal thermometer probe so I asked the ED tech who is currently sitting with him to take it out.  He calmed down a little bit afterwards.   [CF]  5809 CBC is relatively normal with no leukocytosis.  Lactic acid is normal.  No sign of urinary tract infection but he does have ketones in his urine.  I am suspecting increasingly that this is not infection but this is in fact hyperglycemic hyperosmotic state which is resulting in severe metabolic abnormalities including the hypothermia and altered mental status.  VBG shows some acidosis and I am still awaiting the results of the comprehensive metabolic panel and procalcitonin.  Fluids are running but I am holding off on insulin until I know  how his electrolytes look (specifically the potassium).   [CF]  9833 The patient is still hypotensive in the upper 82N systolic after his fluids.  I am going to give a dose of stress dose steroids of hydrocortisone 100 mg IV.  I spoke with the lab and the comprehensive metabolic panel is being rerun because of highly abnormal values but she did tell me that the potassium is 5.3.  Glucose was too high to measure initially and  is being diluted.  I am going to order the insulin drip since we now know that the potassium was high enough to support starting insulin therapy.  The patient is going to CT scan now for CT head and CT chest to rule out acute intracranial abnormality as well as any pneumonia that was not visible on chest x-ray.   [CF]  0458 Procalcitonin is less than 0.5 which is again reassuring.  This does not appear to be the result of sepsis and is likely all secondary to his hyperglycemic hyperosmotic state.  I have added on magnesium and phosphorus levels and will admit the patient as soon as I get the results of the CT scan back.  Insulin has been ordered per glucose stabilizer protocol.  Procalcitonin: 0.34 [CF]  0527 No evidence of intracranial injury, bleeding, nor CVA.  Unchanged from prior.  CT Head Wo Contrast [CF]  0541 No acute abnormalities on chest CT.  Comprehensive metabolic panel [CF]  0539 Discussing by phone with lab.  The beta hydroxybutyric acid is still being diluted.  Glucose is 1223.  Potassium confirmed at 5.3.  Creatinine is 2.54.  I have paged the hospitalist to discuss I asked the lab to go ahead and release the results manually in the computer so that they are visible while she is awaiting the results of the beta hydroxybutyric acid.   [CF]  7673 Ordering another liter of NS.   [CF]  4193 Paged hospitalist.   [CF]  7902 Anion gap(!): 30 [CF]  0605 of note, I confirmed with the patient's wife prior to her departure that the patient is full code   [CF]    Clinical Course User Index [CF] Karma Greaser,  Tommi Rumps, MD    ____________________________________________  FINAL CLINICAL IMPRESSION(S) / ED DIAGNOSES  Final diagnoses:  Hypothermia, initial encounter  Hypotension, unspecified hypotension type  Heart failure, unspecified HF chronicity, unspecified heart failure type ([place])  Altered mental status, unspecified altered mental status type  Acute renal failure, unspecified acute renal  failure type ([place])  Metabolic acidosis  Diabetic ketoacidosis with coma associated with type 2 diabetes mellitus ([place])     MEDICATIONS GIVEN DURING THIS VISIT:  Medications  sodium chloride 0.9 % bolus 1,000 mL (0 mLs Intravenous Stopped 03/21/18 0418)    And  sodium chloride 0.9 % bolus 1,000 mL (1,000 mLs Intravenous New Bag/Given 03/21/18 0518)    And  sodium chloride 0.9 % bolus 250 mL (has no administration in time range)  metroNIDAZOLE (FLAGYL) IVPB 500 mg (500 mg Intravenous New Bag/Given 03/21/18 0517)  insulin regular, human (MYXREDLIN) 100 units/ 100 mL infusion (5.4 Units/hr Intravenous New Bag/Given 03/21/18 0521)  dextrose 5 %-0.45 % sodium chloride infusion (has no administration in time range)  sodium chloride 0.9 % bolus 1,000 mL (has no administration in time range)  atorvastatin (LIPITOR) tablet 10 mg (has no administration in time range)  venlafaxine (EFFEXOR) tablet 37.5 mg (has no administration in time range)  baclofen (LIORESAL) tablet 10 mg (has no administration in time range)  multivitamin with minerals tablet 1 tablet (has no administration in time range)  0.9 %  sodium chloride infusion (has no administration in time range)  0.9 %  sodium chloride infusion (has no administration in time range)  dextrose 5 %-0.45 % sodium chloride infusion (has no administration in time range)  insulin regular, human (MYXREDLIN) 100 units/ 100 mL infusion (has no administration in time range)  enoxaparin (LOVENOX) injection 40 mg (has no administration in time range)  ceFEPIme (MAXIPIME) 2 g in sodium chloride 0.9 % 100 mL IVPB (0 g Intravenous Stopped 03/21/18 0453)  vancomycin (VANCOCIN) 1,750 mg in sodium chloride 0.9 % 500 mL IVPB (1,750 mg Intravenous New Bag/Given 03/21/18 0357)  hydrocortisone sodium succinate (SOLU-CORTEF) 100 MG injection 100 mg (100 mg Intravenous Given 03/21/18 8527)     ED Discharge Orders    None       Note:  This document was prepared using  Dragon voice recognition software and may include unintentional dictation errors.   Hinda Kehr, MD 03/21/18 7824    Hinda Kehr, MD 03/21/18 [phone]

## 2018-03-21 NOTE — ED Notes (Addendum)
ED TO INPATIENT HANDOFF REPORT  ED Nurse Name and Phone #: Anette Guarneri Name/Age/Gender Shawn [name] 63 y.o. male Room/Bed: ED12A/ED12A  Code Status   Code Status: Full Code  Home/SNF/Other Home {Patient oriented [phone] Is this baseline? No   Triage Complete: Triage complete  Chief Complaint ams  Triage Note Pt arrives via ems due to AMS per wife. EMS stated that wife had stated that pt has not been acting like himself for the past 3 days. EMS pt was A&OX1 and was found to have elevated glucose . On arrival to ED pt is moaning and will answer some questions. Pt becomes more alert with physical stimuli.    Allergies No Known Allergies  Level of Care/Admitting Diagnosis ED Disposition    ED Disposition Condition [facility]: [place] [[id number]] Level of Care: ICU [6] Diagnosis: DKA (diabetic ketoacidoses) [facility]) [[id number]] Admitting Physician: Christel Mormon [[id number]] Attending Physician: Christel Mormon [[id number]] Estimated length of sta y: past midnight tomorrow Certification:: I certify this patient will need inpatient services for at least 2 midnights PT Class (Do Not Modify): Inpatient [101] PT Acc Code (Do Not Modify): Private [1]       B Medical/Surgery History Past Medical History:  Diagnosis Date  . Chronic combined systolic (congestive) and diastolic (congestive) heart failure ([place])    a. TTE 6/15: EF < 20%, mildly dilated LV, DD, mildly dilated LA, mod dilated RA, mild MR, mild to mod TR, mod increased posterior wall thickness, elevated LA and LVEDP; b. 4/12019 Echo: EF 20-25%, diff HK. Gr1 DD, nl RV fxn.  . CKD (chronic kidney disease), stage II   . Diabetes mellitus with complication ([place])    a. Prior admissions w/ DKA (last 12/2017).  . Essential hypertension   . NICM (nonischemic cardiomyopathy) ([place])    a. 06/2013 Echo: EF<20%; b. 06/2013 Cath: no signif dzs; c. 04/2017 Echo: 20-25%, gr1 DD.  Marland Kitchen Polysubstance  abuse ([place])    a. etoh and tobacco  . Stroke [facility])    Past Surgical History:  Procedure Laterality Date  . NO PAST SURGERIES       A IV Location/Drains/Wounds Patient Lines/Drains/Airways Status   Active Line/Drains/Airways    Name:   Placement date:   Placement time:   Site:   Days:   Peripheral IV 03/21/18 Left Wrist   03/21/18    0309    Wrist   less than 1   Peripheral IV 03/21/18 Right Hand   03/21/18    0331    Hand   less than 1   Peripheral IV 03/21/18 Left Forearm   03/21/18    0331    Forearm   less than 1          Intake/Output Last 24 hours  Intake/Output Summary (Last 24 hours) at 03/21/2018 4097 Last data filed at 03/21/2018 3532 Gross per 24 hour  Intake 100 ml  Output 700 ml  Net -600 ml    Labs/Imaging Results for orders placed or performed during the hospital encounter of 03/21/18 (from the past 48 hour(s))  Lactic acid, plasma     Status: None   Collection Time: 03/21/18  3:14 AM  Result Value Ref Range   Lactic Acid, Venous 1.7 0.5 - 1.9 mmol/L    Comment: Performed at [facility], [address] [place], [place] [address]  Comprehensive metabolic panel     Status: Abnormal   Collection Time: 03/21/18  3:14 AM  Result Value Ref Range   Sodium 140 135 - 145 mmol/L    Comment: ELECTROLYTES REPEATED SMA   Potassium 5.3 (H) 3.5 - 5.1 mmol/L   Chloride 99 98 - 111 mmol/L   CO2 11 (L) 22 - 32 mmol/L   Glucose, Bld 1,223 (HH) 70 - 99 mg/dL    Comment: CRITICAL RESULT CALLED TO, READ BACK BY AND VERIFIED WITH  CORY FORBACH AT 0545 03/21/18 SMA RESULT CONFIRMED BY MANUAL DILUTION SMA    BUN 71 (H) 8 - 23 mg/dL   Creatinine, Ser 2.54 (H) 0.61 - 1.24 mg/dL   Calcium 8.5 (L) 8.9 - 10.3 mg/dL   Total Protein 5.5 (L) 6.5 - 8.1 g/dL   Albumin 2.9 (L) 3.5 - 5.0 g/dL   AST 26 15 - 41 U/L   ALT 19 0 - 44 U/L   Alkaline Phosphatase 108 38 - 126 U/L   Total Bilirubin 1.7 (H) 0.3 - 1.2 mg/dL   GFR calc non Af Amer 26 (L) >60 mL/min   GFR calc Af  Amer 30 (L) >60 mL/min   Anion gap 30 (H) 5 - 15    Comment: Performed at [facility], [address] [place], [place] [address]  CBC WITH DIFFERENTIAL     Status: Abnormal   Collection Time: 03/21/18  3:14 AM  Result Value Ref Range   WBC 7.9 4.0 - 10.5 K/uL   RBC 3.90 (L) 4.22 - 5.81 MIL/uL   Hemoglobin 11.7 (L) 13.0 - 17.0 g/dL   HCT 38.4 (L) 39.0 - 52.0 %   MCV 98.5 80.0 - 100.0 fL   MCH 30.0 26.0 - 34.0 pg   MCHC 30.5 30.0 - 36.0 g/dL   RDW 14.0 11.5 - 15.5 %   Platelets 318 150 - 400 K/uL   nRBC 0.0 0.0 - 0.2 %   Neutrophils Relative % 69 %   Neutro Abs 5.5 1.7 - 7.7 K/uL   Lymphocytes Relative 20 %   Lymphs Abs 1.6 0.7 - 4.0 K/uL   Monocytes Relative 8 %   Monocytes Absolute 0.7 0.1 - 1.0 K/uL   Eosinophils Relative 0 %   Eosinophils Absolute 0.0 0.0 - 0.5 K/uL   Basophils Relative 1 %   Basophils Absolute 0.0 0.0 - 0.1 K/uL   Immature Granulocytes 2 %   Abs Immature Granulocytes 0.12 (H) 0.00 - 0.07 K/uL    Comment: Performed at [facility], [address] [place], [place] [address]  Lipase, blood     Status: None   Collection Time: 03/21/18  3:14 AM  Result Value Ref Range   Lipase 21 11 - 51 U/L    Comment: Performed at [facility], [address] [place], [place] [address]  Brain natriuretic peptide - IF patient is dyspneic     Status: Abnormal   Collection Time: 03/21/18  3:14 AM  Result Value Ref Range   B Natriuretic Peptide 374.0 (H) 0.0 - 100.0 pg/mL    Comment: Performed at [facility], [place], [place] [address]  Procalcitonin     Status: None   Collection Time: 03/21/18  3:14 AM  Result Value Ref Range   Procalcitonin 0.34 ng/mL    Comment:        Interpretation: PCT (Procalcitonin) <= 0.5 ng/mL: Systemic infection (sepsis) is not likely. Local bacterial infection is possible. (NOTE)       Sepsis PCT Algorithm  Lower Respiratory Tract                                       Infection PCT Algorithm    ----------------------------     ----------------------------         PCT < 0.25 ng/mL                PCT < 0.10 ng/mL         Strongly encourage             Strongly discourage   discontinuation of antibiotics    initiation of antibiotics    ----------------------------     -----------------------------       PCT 0.25 - 0.50 ng/mL            PCT 0.10 - 0.25 ng/mL               OR       >80% decrease in PCT            Discourage initiation of                                            antibiotics      Encourage discontinuation           of antibiotics    ----------------------------     -----------------------------         PCT >= 0.50 ng/mL              PCT 0.26 - 0.50 ng/mL               AND        <80% decrease in PCT             Encourage initiation of                                             antibiotics       Encourage continuation           of antibiotics    ----------------------------     -----------------------------        PCT >= 0.50 ng/mL                  PCT > 0.50 ng/mL               AND         increase in PCT                  Strongly encourage                                      initiation of antibiotics    Strongly encourage escalation           of antibiotics                                     -----------------------------  PCT <= 0.25 ng/mL                                                 OR                                        > 80% decrease in PCT                                     Discontinue / Do not initiate                                             antibiotics Performed at [facility], [address] [place], [place] [address]   Protime-INR     Status: None   Collection Time: 03/21/18  3:14 AM  Result Value Ref Range   Prothrombin Time 13.4 11.4 - 15.2 seconds   INR 1.0 0.8 - 1.2    Comment: (NOTE) INR goal varies based on device and disease states. Performed  at [facility], [address] [place], [place] [address]   Urinalysis, Complete w Microscopic     Status: Abnormal   Collection Time: 03/21/18  3:14 AM  Result Value Ref Range   Color, Urine STRAW (A) YELLOW   APPearance CLEAR (A) CLEAR   Specific Gravity, Urine 1.020 1.005 - 1.030   pH 5.0 5.0 - 8.0   Glucose, UA >=500 (A) NEGATIVE mg/dL   Hgb urine dipstick SMALL (A) NEGATIVE   Bilirubin Urine NEGATIVE NEGATIVE   Ketones, ur 20 (A) NEGATIVE mg/dL   Protein, ur NEGATIVE NEGATIVE mg/dL   Nitrite NEGATIVE NEGATIVE   Leukocytes,Ua NEGATIVE NEGATIVE   RBC / HPF 0-5 0 - 5 RBC/hpf   WBC, UA 0-5 0 - 5 WBC/hpf   Bacteria, UA NONE SEEN NONE SEEN   Squamous Epithelial / LPF 0-5 0 - 5    Comment: Performed at [facility], [address] [place], [place] [address]  Glucose, capillary     Status: Abnormal   Collection Time: 03/21/18  3:15 AM  Result Value Ref Range   Glucose-Capillary >600 (HH) 70 - 99 mg/dL  Glucose, capillary     Status: Abnormal   Collection Time: 03/21/18  3:16 AM  Result Value Ref Range   Glucose-Capillary >600 (HH) 70 - 99 mg/dL   Comment 1 Notify RN    Comment 2 Repeat Test   Blood gas, venous ([facility], [facility], [facility])     Status: Abnormal   Collection Time: 03/21/18  3:34 AM  Result Value Ref Range   pH, Ven 7.21 (L) 7.250 - 7.430   pCO2, Ven 26 (L) 44.0 - 60.0 mmHg   pO2, Ven 84.0 (H) 32.0 - 45.0 mmHg   Bicarbonate 10.4 (L) 20.0 - 28.0 mmol/L   Acid-base deficit 15.8 (H) 0.0 - 2.0 mmol/L   O2 Saturation 93.6 %   Patient temperature 37.0    Collection site VENOUS    Sample type VENOUS     Comment: Performed at [facility], [address] [place], [place] [address]  Glucose, capillary     Status: Abnormal   Collection Time: 03/21/18  6:14 AM  Result Value Ref Range   Glucose-Capillary >600 (HH) 70 - 99 mg/dL   Ct Head Wo Contrast  Result Date: 03/21/2018 CLINICAL DATA:  Unexplained altered level of consciousness EXAM: CT HEAD  WITHOUT CONTRAST TECHNIQUE: Contiguous axial images were obtained from the base of the skull through the vertex without intravenous contrast. COMPARISON:  04/27/2017 FINDINGS: Brain: No evidence of acute infarction, hemorrhage, hydrocephalus, extra-axial collection or mass lesion/mass effect. Bilateral inferior frontal encephalomalacia, posttraumatic appearance. Generalized cerebral volume loss. Vascular: No hyperdense vessel or unexpected calcification. Skull: Normal. Negative for fracture or focal lesion. Sinuses/Orbits: No acute finding. IMPRESSION: 1. No acute finding or change from 11 months prior. 2. Bilateral inferior frontal encephalomalacia, posttraumatic appearance. Electronically Signed   By: Monte Fantasia M.D.   On: 03/21/2018 05:20   Ct Chest Wo Contrast  Result Date: 03/21/2018 CLINICAL DATA:  Altered mental status. Cough. EXAM: CT CHEST WITHOUT CONTRAST TECHNIQUE: Multidetector CT imaging of the chest was performed following the standard protocol without IV contrast. COMPARISON:  None. FINDINGS: Cardiovascular: Aortic and coronary artery calcific atherosclerosis. Mild cardiomegaly. No pericardial effusion. Normal course and caliber of the aorta. Mediastinum/Nodes: No mediastinal, axillary or hilar lymphadenopathy. Normal thoracic esophageal course. Visualized thyroid is normal. Lungs/Pleura: There is biapical scarring and bibasilar atelectasis. No nodule or mass. No focal consolidation. No pleural effusion or pneumothorax. Upper Abdomen: No acute abnormality. Musculoskeletal: No chest wall mass or suspicious bone lesions identified. IMPRESSION: 1. No acute thoracic abnormality. 2. Mild cardiomegaly with coronary artery calcification and aortic atherosclerosis (ICD10-I70.0). Electronically Signed   By: Ulyses Jarred M.D.   On: 03/21/2018 05:34   Dg Chest Port 1 View  Result Date: 03/21/2018 CLINICAL DATA:  Cough EXAM: PORTABLE CHEST 1 VIEW COMPARISON:  12/15/2017 FINDINGS: Mild cardiomegaly.  No focal airspace consolidation. No pleural effusion or pneumothorax. IMPRESSION: No active disease. Electronically Signed   By: Ulyses Jarred M.D.   On: 03/21/2018 04:08    Pending Labs Unresulted Labs (From admission, onward)    Start     Ordered   03/21/18 7793  Basic metabolic panel  STAT Now then every 4 hours ,   STAT     03/21/18 0601   03/21/18 0455  Magnesium  Add-on,   AD     03/21/18 0454   03/21/18 0455  Phosphorus  Add-on,   AD     03/21/18 0454   03/21/18 0442  Urine Drug Screen, Qualitative ([facility] only)  Add-on,   AD     03/21/18 0441   03/21/18 0442  Ethanol  Add-on,   AD     03/21/18 0441   03/21/18 0336  Beta-hydroxybutyric acid  Add-on,   AD     03/21/18 0335   03/21/18 0335  Urine culture  Add-on,   AD     03/21/18 0335   03/21/18 0324  Lactic acid, plasma  Now then every 2 hours,   STAT     03/21/18 0328   03/21/18 0324  Blood Culture (routine x 2)  BLOOD CULTURE X 2,   STAT     03/21/18 0328          Vitals/Pain   Isolation Precautions No active isolations  Medications Medications  sodium chloride 0.9 % bolus 1,000 mL (0 mLs Intravenous Stopped 03/21/18 0418)    And  sodium chloride 0.9 % bolus 1,000 mL (0 mLs  Intravenous Stopped 03/21/18 0611)    And  sodium chloride 0.9 % bolus 250 mL (has no administration in time range)  dextrose 5 %-0.45 % sodium chloride infusion (has no administration in time range)  sodium chloride 0.9 % bolus 1,000 mL (has no administration in time range)  atorvastatin (LIPITOR) tablet 10 mg (has no administration in time range)  venlafaxine (EFFEXOR) tablet 37.5 mg (has no administration in time range)  baclofen (LIORESAL) tablet 10 mg (has no administration in time range)  multivitamin with minerals tablet 1 tablet (has no administration in time range)  0.9 %  sodium chloride infusion (has no administration in time range)  0.9 %  sodium chloride infusion ( Intravenous New Bag/Given 03/21/18 0622)  dextrose 5 %-0.45 %  sodium chloride infusion (has no administration in time range)  insulin regular, human (MYXREDLIN) 100 units/ 100 mL infusion (10.8 Units/hr Intravenous New Bag/Given 03/21/18 0621)  enoxaparin (LOVENOX) injection 30 mg (has no administration in time range)  norepinephrine (LEVOPHED) 4 mg in dextrose 5 % 250 mL (0.016 mg/mL) infusion (has no administration in time range)  piperacillin-tazobactam (ZOSYN) IVPB 3.375 g (has no administration in time range)  ceFEPIme (MAXIPIME) 2 g in sodium chloride 0.9 % 100 mL IVPB (0 g Intravenous Stopped 03/21/18 0453)  metroNIDAZOLE (FLAGYL) IVPB 500 mg (0 mg Intravenous Stopped 03/21/18 0624)  vancomycin (VANCOCIN) 1,750 mg in sodium chloride 0.9 % 500 mL IVPB (0 mg Intravenous Stopped 03/21/18 0611)  hydrocortisone sodium succinate (SOLU-CORTEF) 100 MG injection 100 mg (100 mg Intravenous Given 03/21/18 0517)    Mobility Pt has AMS and wife stated that pt can normally walk but status is unknown at this time.     Focused Assessments       R Recommendations: See Admitting Provider Note  Report given to:   Additional Notes:  Pt will response to his name being called and will answer some questions.

## 2018-03-21 NOTE — Progress Notes (Signed)
CODE SEPSIS - PHARMACY COMMUNICATION  **Broad Spectrum Antibiotics should be administered within 1 hour of Sepsis diagnosis**  Time Code Sepsis Called/Page Received: 3536  Antibiotics Ordered: vanc/cefepime/flagyl  Time of 1st antibiotic administration: 1443  Additional action taken by pharmacy:   If necessary, Name of Provider/Nurse Contacted:     Tobie Lords ,PharmD Clinical Pharmacist  03/21/2018  4:13 AM

## 2018-03-21 NOTE — Progress Notes (Addendum)
Pharmacy Antibiotic Note  Shawn [name] is a 63 y.o. male admitted on 03/21/2018 with sepsis.  Pharmacy has been consulted for vanc/zosyn dosing.  Plan: Patient received vanc 1.75g IV load and cefepime 2g, and flagyl 500 mg IV x 1 in ED  Vancomycin 750 mg IV Q 24 hrs. Goal AUC 400-550. Expected AUC: 488.1 SCr used: 2.54 mg/dL Cssmin: 14.1 mcg/mL  Will start zosyn 3.375g IV q8h per extended infusion per hospitalist orders Will continue to monitor renal function  Height: 5\' 11"  (180.3 cm) Weight: 150 lb (68 kg) IBW/kg (Calculated) : 75.3  Temp (24hrs), Avg:93.7 F (34.3 C), Min:93.3 F (34.1 C), Max:94 F (34.4 C)  Recent Labs  Lab 03/21/18 0314  WBC 7.9  CREATININE 2.54*  LATICACIDVEN 1.7    Estimated Creatinine Clearance: 29.4 mL/min (A) (by C-G formula based on SCr of 2.54 mg/dL (H)).    No Known Allergies  Thank you for allowing pharmacy to be a part of this patient's care.  Tobie Lords, PharmD, BCPS Clinical Pharmacist 03/21/2018

## 2018-03-21 NOTE — TOC Initial Note (Signed)
Transition of Care [facility]) - Initial/Assessment Note    Patient Details  Name: Shawn [name] MRN: [id number] Date of Birth: Aug 23, 1955  Transition of Care [facility]) CM/SW Contact:    Shelbie Hutching, RN Phone Number: 03/21/2018, 1:58 PM  Clinical Narrative:                 Patient admitted with DKA and altered mental status.  Patient is in the ICU still altered, unable to answer questions, restless in the bed.  Wife is at the bedside.  Patient is from home with wife.  Patient's brother is also at the bedside.  Wife and brother report that they are both concerned about the patient's health and are a source of support for him.  Wife reports that her health has not been good and it could be negatively affecting the patient because he is worried about caring for her.  Wife reports that the patient stopped taking his insulin and stopped eating 2 days ago.  Patient has PCP services with the [facility], wife refuses transfer.  Patient gets some of his medications from the [place] and others from a [place].  Wife reports that at baseline patient is independent and drives.  He was recently discharged from home health services with [facility].  Wife would be agreeable to resuming home health services, she does report that the patient is stuborn.  Medicare approved list provided for choice.   RNCM will follow patient progress.    Expected Discharge Plan: [place]     Patient Goals and CMS Choice   CMS Medicare.gov Compare Post Acute Care list provided to:: Patient Represenative (must comment)(Wife) Choice offered to / list presented to : Spouse  Expected Discharge Plan and Services Expected Discharge Plan: [place] Discharge Planning Services: CM Consult Post Acute Care Choice: [place] arrangements for the past 2 months: Single Family Home                          Prior Living Arrangements/Services Living  arrangements for the past 2 months: Single Family Home Lives with:: Spouse          Need for Family Participation in Patient Care: Yes (Comment) Care giver support system in place?: Yes (comment)      Activities of Daily Living Home Assistive Devices/Equipment: None ADL Screening (condition at time of admission) Patient's cognitive ability adequate to safely complete daily activities?: Yes Is the patient deaf or have difficulty hearing?: No Does the patient have difficulty seeing, even when wearing glasses/contacts?: No Does the patient have difficulty concentrating, remembering, or making decisions?: Yes Patient able to express need for assistance with ADLs?: Yes Does the patient have difficulty dressing or bathing?: No Independently performs ADLs?: Yes (appropriate for developmental age) Does the patient have difficulty walking or climbing stairs?: No Weakness of Legs: None Weakness of Arms/Hands: None  Permission Sought/Granted                  Emotional Assessment Appearance:: Appears older than stated age Attitude/Demeanor/Rapport: Uncooperative, Unable to Assess(altered) Affect (typically observed): Restless   Alcohol / Substance Use: Alcohol Use Psych Involvement: No (comment)  Admission diagnosis:  Metabolic acidosis [W26.3] Hypothermia, initial encounter [T68.XXXA] Hypotension, unspecified hypotension type [I95.9] Acute renal failure, unspecified acute renal failure type ([place]) [N17.9] Altered mental status, unspecified altered mental status type [R41.82] Diabetic ketoacidosis with coma associated with  type 2 diabetes mellitus ([name]) [E11.11] Heart failure, unspecified HF chronicity, unspecified heart failure type [facility]) [I50.9] Patient Active Problem List   Diagnosis Date Noted  . Chronic systolic CHF (congestive heart failure) ([place]) 12/15/2017  . NICM (nonischemic cardiomyopathy) ([place]) 05/13/2017  . Type 2 diabetes mellitus with complication, with long-term  current use of insulin ([place]) 05/13/2017  . Smoker 05/13/2017  . Polysubstance abuse ([place]) 05/13/2017  . Alcohol abuse 05/13/2017  . DKA (diabetic ketoacidoses) ([place]) 04/27/2017   PCP:  Center, [place], [place] - [address] [place] [address] [place] [address] Phone: [phone] Fax: [phone]     Social Determinants of Health (SDOH) Interventions    Readmission Risk Interventions 30 Day Unplanned Readmission Risk Score     ED to Hosp-Admission (Current) from 03/21/2018 in [place] ICU/CCU  30 Day Unplanned Readmission Risk Score (%)  29 Filed at 03/21/2018 1200     This score is the patient's risk of an unplanned readmission within 30 days of being discharged (0 -100%). The score is based on dignosis, age, lab data, medications, orders, and past utilization.   Low:  0-14.9   Medium: 15-21.9   High: 22-29.9   Extreme: 30 and above       Readmission Risk Prevention Plan 12/17/2017  Transportation Screening Complete  PCP or Specialist Appt within 3-5 Days Complete  Home Care Screening Complete  Social Work Consult for [place] Planning/Counseling Patient refused  Palliative Care Screening Patient refused  Medication Review Press photographer) Complete  Some recent data might be hidden

## 2018-03-21 NOTE — Consult Note (Signed)
CRITICAL CARE NOTE      CHIEF COMPLAINT:   Lethargy, DKA   HPI    This is a 63 yo male with hx of alcoholism and illicit drug use, DM , HFrEF 20%, HTN, lifelong smoker, hx of CVA.  Came in to ED in shock with severe hyperglycemia.  I was asked by hospital service to see this patient for critical care consultation.  During interview patient is unresponsive and I was able to get details of history from sister Magda Paganini as well as his wife Dub Mikes.  Dub Mikes reports that patient has been noncompliant with insulin due to obtunded mental state over the last 2 weeks.  She states that he has been refusing to eat, with poor mental status and urinating on himself.  She states that he has had similar episodes in the past and has had to be brought to the hospital and treated for DKA.  She states that he has not been febrile has not had any sick contacts, has not had nausea vomiting or diarrhea, has not had cough.  Empirically on antibiotics but no signs of infection after my evaluation including absence of leukocytosis, clear cultures up-to-date, negative influenza testing, negative drug and alcohol screen, RVP pending.  Blood work is significant for elevated lactate on admission as well as hyperglycemia over 1200 hyperkalemia at 5.3 acute on chronic kidney disease stage IV, high anion gap metabolic acidosis.   PAST MEDICAL HISTORY   Past Medical History:  Diagnosis Date  . Chronic combined systolic (congestive) and diastolic (congestive) heart failure ([place])    a. TTE 6/15: EF < 20%, mildly dilated LV, DD, mildly dilated LA, mod dilated RA, mild MR, mild to mod TR, mod increased posterior wall thickness, elevated LA and LVEDP; b. 4/12019 Echo: EF 20-25%, diff HK. Gr1 DD, nl RV fxn.  . CKD (chronic kidney disease), stage II   . Diabetes  mellitus with complication ([place])    a. Prior admissions w/ DKA (last 12/2017).  . Essential hypertension   . NICM (nonischemic cardiomyopathy) ([place])    a. 06/2013 Echo: EF<20%; b. 06/2013 Cath: no signif dzs; c. 04/2017 Echo: 20-25%, gr1 DD.  Marland Kitchen Polysubstance abuse ([place])    a. etoh and tobacco  . Stroke [facility])      SURGICAL HISTORY   Past Surgical History:  Procedure Laterality Date  . NO PAST SURGERIES       FAMILY HISTORY   Family History  Problem Relation Age of Onset  . Congestive Heart Failure Mother   . Diabetes Mother   . Congestive Heart Failure Brother      SOCIAL HISTORY   Social History   Tobacco Use  . Smoking status: Current Every Day Smoker    Packs/day: 0.50    Types: Cigarettes  . Smokeless tobacco: Never Used  Substance Use Topics  . Alcohol use: Not Currently    Frequency: Never  . Drug use: Not on file     MEDICATIONS   Current Medication:  Current Facility-Administered Medications:  .  0.9 %  sodium chloride infusion, , Intravenous, Continuous, Mansy, Jan A, MD, Last Rate: 100 mL/hr at 03/21/18 0814 .  atorvastatin (LIPITOR) tablet 10 mg, 10 mg, Oral, QHS, Mansy, Jan A, MD .  baclofen (LIORESAL) tablet 10 mg, 10 mg, Oral, BID PRN, Mansy, Jan A, MD .  dextrose 5 %-0.45 % sodium chloride infusion, , Intravenous, Continuous, Hinda Kehr, MD .  dextrose 5 %-0.45 % sodium chloride infusion, , Intravenous, Continuous, Mansy,  Jan A, MD .  enoxaparin (LOVENOX) injection 30 mg, 30 mg, Subcutaneous, Q24H, Mansy, Jan A, MD .  insulin regular, human (MYXREDLIN) 100 units/ 100 mL infusion, , Intravenous, Continuous, Mansy, Jan A, MD, Last Rate: 16.2 mL/hr at 03/21/18 0723 .  multivitamin with minerals tablet 1 tablet, 1 tablet, Oral, Daily, Mansy, Jan A, MD .  norepinephrine (LEVOPHED) 4 mg in dextrose 5 % 250 mL (0.016 mg/mL) infusion, 0-40 mcg/min, Intravenous, Titrated, Mansy, Jan A, MD .  piperacillin-tazobactam (ZOSYN) IVPB 3.375 g, 3.375 g,  Intravenous, Q8H, Mansy, Jan A, MD .  [COMPLETED] sodium chloride 0.9 % bolus 1,000 mL, 1,000 mL, Intravenous, Once, Stopped at 03/21/18 0418 **AND** [COMPLETED] sodium chloride 0.9 % bolus 1,000 mL, 1,000 mL, Intravenous, Once, Stopped at 03/21/18 0611 **AND** sodium chloride 0.9 % bolus 250 mL, 250 mL, Intravenous, Once, Hinda Kehr, MD .  Derrill Memo ON 03/22/2018] vancomycin (VANCOCIN) IVPB 750 mg/150 ml premix, 750 mg, Intravenous, Q24H, Mansy, Jan A, MD .  venlafaxine [facility] tablet 37.5 mg, 37.5 mg, Oral, Daily, Mansy, Arvella Merles, MD    ALLERGIES   Patient has no known allergies.    REVIEW OF SYSTEMS    Unable to obtain ROS due to acute illness  PHYSICAL EXAMINATION   Vitals:   03/21/18 0700 03/21/18 0800  BP: (!) 111/52 (!) 102/53  Pulse: 70 72  Resp: 17 17  Temp:  (!) 36 C  SpO2: 95% 93%    GENERAL: Lethargic HEAD: Normocephalic, atraumatic.  EYES: Pupils equal, round, reactive to light.  No scleral icterus.  MOUTH: Moist mucosal membrane. NECK: Supple. No thyromegaly. No nodules. No JVD.  PULMONARY: Clear to auscultation bilaterally no wheezing no, crackles, no rhonchi CARDIOVASCULAR: S1 and S2. Regular rate and rhythm. No murmurs, rubs, or gallops.  GASTROINTESTINAL: Soft, nontender, non-distended. No masses. Positive bowel sounds. No hepatosplenomegaly.  MUSCULOSKELETAL: No swelling, clubbing, or edema.  NEUROLOGIC: Mild distress due to acute illness SKIN:intact,warm,dry   LABS AND IMAGING     -I personally reviewed most recent blood work, imaging and microbiology - significant findings today are lactic acidosis, hyperkalemia, acute on chronic renal insufficiency stage IV, anemia  LAB RESULTS: Recent Labs  Lab 03/21/18 0314  NA 140  K 5.3*  CL 99  CO2 11*  BUN 71*  CREATININE 2.54*  GLUCOSE 1,223*   Recent Labs  Lab 03/21/18 0314  HGB 11.7*  HCT 38.4*  WBC 7.9  PLT 318     IMAGING RESULTS: Ct Head Wo Contrast  Result Date: 03/21/2018  CLINICAL DATA:  Unexplained altered level of consciousness EXAM: CT HEAD WITHOUT CONTRAST TECHNIQUE: Contiguous axial images were obtained from the base of the skull through the vertex without intravenous contrast. COMPARISON:  04/27/2017 FINDINGS: Brain: No evidence of acute infarction, hemorrhage, hydrocephalus, extra-axial collection or mass lesion/mass effect. Bilateral inferior frontal encephalomalacia, posttraumatic appearance. Generalized cerebral volume loss. Vascular: No hyperdense vessel or unexpected calcification. Skull: Normal. Negative for fracture or focal lesion. Sinuses/Orbits: No acute finding. IMPRESSION: 1. No acute finding or change from 11 months prior. 2. Bilateral inferior frontal encephalomalacia, posttraumatic appearance. Electronically Signed   By: Monte Fantasia M.D.   On: 03/21/2018 05:20   Ct Chest Wo Contrast  Result Date: 03/21/2018 CLINICAL DATA:  Altered mental status. Cough. EXAM: CT CHEST WITHOUT CONTRAST TECHNIQUE: Multidetector CT imaging of the chest was performed following the standard protocol without IV contrast. COMPARISON:  None. FINDINGS: Cardiovascular: Aortic and coronary artery calcific atherosclerosis. Mild cardiomegaly. No pericardial effusion. Normal course and  caliber of the aorta. Mediastinum/Nodes: No mediastinal, axillary or hilar lymphadenopathy. Normal thoracic esophageal course. Visualized thyroid is normal. Lungs/Pleura: There is biapical scarring and bibasilar atelectasis. No nodule or mass. No focal consolidation. No pleural effusion or pneumothorax. Upper Abdomen: No acute abnormality. Musculoskeletal: No chest wall mass or suspicious bone lesions identified. IMPRESSION: 1. No acute thoracic abnormality. 2. Mild cardiomegaly with coronary artery calcification and aortic atherosclerosis (ICD10-I70.0). Electronically Signed   By: Ulyses Jarred M.D.   On: 03/21/2018 05:34   Dg Chest Port 1 View  Result Date: 03/21/2018 CLINICAL DATA:  Cough EXAM:  PORTABLE CHEST 1 VIEW COMPARISON:  12/15/2017 FINDINGS: Mild cardiomegaly. No focal airspace consolidation. No pleural effusion or pneumothorax. IMPRESSION: No active disease. Electronically Signed   By: Ulyses Jarred M.D.   On: 03/21/2018 04:08      ASSESSMENT AND PLAN    -Multidisciplinary rounds held today  Altered mental status with confusion -Likely due to diabetic ketoacidosis    -Starting DKA protocol    -Serial BMPs -Conservative fluid strategy due to severe CHF.    Circulatory shock   -Likely due to hypovolemia secondary to above   -Conservative IV rehydration-most recent TTE April 2019 shows EF of 20 to 25% we will monitor closely for signs of increased oxygen requirement/pulmonary edema   CARDIAC FAILURE- Chronic heart failure with reduced EF at 20 to 25% as well as grade 1 diastolic dysfunction -oxygen as needed -follow up cardiac enzymes as indicated ICU monitoring -BNP 374 on arrival  Renal Failure-most likely due dehydration -follow chem 7 -follow UO -continue Foley Catheter-assess need daily -Baseline creatinine at 0.64 currently at 2.54-AKI stage III    NEUROLOGY - altered mental status with confusion -Likely due to DKA with possible infectious etiology versus noncompliance which seems more likely per history-we will consider lumbar puncture if mentation does not improve post DKA treatment -Banana bag with thiamine for chronic alcoholism/possible Wernicke's encephalopathy  ID -continue empiric IV abx as prescibed -follow up cultures  GI/Nutrition GI PROPHYLAXIS as indicated DIET-->TF's as tolerated Constipation protocol as indicated  ENDO - ICU DKA hypoglycemic\Hyperglycemia protocol -check FSBS per protocol   ELECTROLYTES -follow labs as needed -replace as needed -pharmacy consultation -Monitoring for refeeding syndrome   DVT/GI PRX ordered -SCDs  TRANSFUSIONS AS NEEDED MONITOR FSBS ASSESS the need for LABS as needed   Critical  care provider statement:    Critical care time (minutes):  37   Critical care time was exclusive of:  Separately billable procedures and treating other patients   Critical care was necessary to treat or prevent imminent or life-threatening deterioration of the following conditions:   Altered mental status with lethargy, circulatory shock, DKA, acute on chronic kidney disease stage III, high anion gap metabolic acidosis, multiple comorbid conditions including alcoholism and illicit drug abuse   Critical care was time spent personally by me on the following activities:  Development of treatment plan with patient or surrogate, discussions with consultants, evaluation of patient's response to treatment, examination of patient, obtaining history from patient or surrogate, ordering and performing treatments and interventions, ordering and review of laboratory studies and re-evaluation of patient's condition.  I assumed direction of critical care for this patient from another provider in my specialty: no    This document was prepared using Dragon voice recognition software and may include unintentional dictation errors.    Ottie Glazier, M.D.  Division of [place]

## 2018-03-21 NOTE — ED Notes (Signed)
Report was called ICU charge nurse.

## 2018-03-21 NOTE — ED Notes (Signed)
Pt went to CT

## 2018-03-22 ENCOUNTER — Inpatient Hospital Stay: Payer: Medicaid Other

## 2018-03-22 LAB — BASIC METABOLIC PANEL
Anion gap: 6 (ref 5–15)
Anion gap: 8 (ref 5–15)
Anion gap: 8 (ref 5–15)
Anion gap: 8 (ref 5–15)
Anion gap: 6 (ref 5–15)
BUN: 48 mg/dL — ABNORMAL HIGH (ref 8–23)
BUN: 41 mg/dL — ABNORMAL HIGH (ref 8–23)
BUN: 40 mg/dL — ABNORMAL HIGH (ref 8–23)
BUN: 38 mg/dL — ABNORMAL HIGH (ref 8–23)
BUN: 34 mg/dL — ABNORMAL HIGH (ref 8–23)
CALCIUM: 8.5 mg/dL — AB (ref 8.9–10.3)
CO2: 30 mmol/L (ref 22–32)
CO2: 29 mmol/L (ref 22–32)
CO2: 27 mmol/L (ref 22–32)
CO2: 25 mmol/L (ref 22–32)
CO2: 28 mmol/L (ref 22–32)
CREATININE: 1.14 mg/dL (ref 0.61–1.24)
CREATININE: 1.02 mg/dL (ref 0.61–1.24)
Calcium: 8.5 mg/dL — ABNORMAL LOW (ref 8.9–10.3)
Calcium: 8.4 mg/dL — ABNORMAL LOW (ref 8.9–10.3)
Calcium: 8.2 mg/dL — ABNORMAL LOW (ref 8.9–10.3)
Calcium: 8.2 mg/dL — ABNORMAL LOW (ref 8.9–10.3)
Chloride: 119 mmol/L — ABNORMAL HIGH (ref 98–111)
Chloride: 113 mmol/L — ABNORMAL HIGH (ref 98–111)
Chloride: 115 mmol/L — ABNORMAL HIGH (ref 98–111)
Chloride: 110 mmol/L (ref 98–111)
Chloride: 107 mmol/L (ref 98–111)
Creatinine, Ser: 1.18 mg/dL (ref 0.61–1.24)
Creatinine, Ser: 1.13 mg/dL (ref 0.61–1.24)
Creatinine, Ser: 0.96 mg/dL (ref 0.61–1.24)
GFR calc Af Amer: >60 mL/min (ref >60)
GFR calc non Af Amer: >60 mL/min (ref >60)
GFR calc non Af Amer: >60 mL/min (ref >60)
GFR calc non Af Amer: >60 mL/min (ref >60)
GFR calc non Af Amer: >60 mL/min (ref >60)
Glucose, Bld: 130 mg/dL — ABNORMAL HIGH (ref 70–99)
Glucose, Bld: 224 mg/dL — ABNORMAL HIGH (ref 70–99)
Glucose, Bld: 235 mg/dL — ABNORMAL HIGH (ref 70–99)
Glucose, Bld: 384 mg/dL — ABNORMAL HIGH (ref 70–99)
Glucose, Bld: 356 mg/dL — ABNORMAL HIGH (ref 70–99)
Potassium: 3.7 mmol/L (ref 3.5–5.1)
Potassium: 3.5 mmol/L (ref 3.5–5.1)
Potassium: 3.7 mmol/L (ref 3.5–5.1)
Potassium: 5.1 mmol/L (ref 3.5–5.1)
Potassium: 3.6 mmol/L (ref 3.5–5.1)
Sodium: 155 mmol/L — ABNORMAL HIGH (ref 135–145)
Sodium: 150 mmol/L — ABNORMAL HIGH (ref 135–145)
Sodium: 150 mmol/L — ABNORMAL HIGH (ref 135–145)
Sodium: 143 mmol/L (ref 135–145)
Sodium: 141 mmol/L (ref 135–145)

## 2018-03-22 LAB — URINE CULTURE: Culture: NO GROWTH

## 2018-03-22 LAB — PHOSPHORUS: Phosphorus: 1.6 mg/dL — ABNORMAL LOW (ref 2.5–4.6)

## 2018-03-22 LAB — GLUCOSE, CAPILLARY
Glucose-Capillary: 98 mg/dL (ref 70–99)
Glucose-Capillary: 77 mg/dL (ref 70–99)
Glucose-Capillary: 69 mg/dL — ABNORMAL LOW (ref 70–99)
Glucose-Capillary: 140 mg/dL — ABNORMAL HIGH (ref 70–99)
Glucose-Capillary: 106 mg/dL — ABNORMAL HIGH (ref 70–99)
Glucose-Capillary: 162 mg/dL — ABNORMAL HIGH (ref 70–99)
Glucose-Capillary: 150 mg/dL — ABNORMAL HIGH (ref 70–99)
Glucose-Capillary: 131 mg/dL — ABNORMAL HIGH (ref 70–99)
Glucose-Capillary: 173 mg/dL — ABNORMAL HIGH (ref 70–99)
Glucose-Capillary: 119 mg/dL — ABNORMAL HIGH (ref 70–99)
Glucose-Capillary: 184 mg/dL — ABNORMAL HIGH (ref 70–99)
Glucose-Capillary: 230 mg/dL — ABNORMAL HIGH (ref 70–99)
Glucose-Capillary: 342 mg/dL — ABNORMAL HIGH (ref 70–99)

## 2018-03-22 LAB — CBC
HCT: 38.9 % — ABNORMAL LOW (ref 39.0–52.0)
Hemoglobin: 13.1 g/dL (ref 13.0–17.0)
MCH: 30 pg (ref 26.0–34.0)
MCHC: 33.7 g/dL (ref 30.0–36.0)
MCV: 89.2 fL (ref 80.0–100.0)
Platelets: 308 10*3/uL (ref 150–400)
RBC: 4.36 MIL/uL (ref 4.22–5.81)
RDW: 13.7 % (ref 11.5–15.5)
WBC: 14.1 10*3/uL — ABNORMAL HIGH (ref 4.0–10.5)
nRBC: 0.1 % (ref 0.0–0.2)

## 2018-03-22 LAB — MAGNESIUM: Magnesium: 2.4 mg/dL (ref 1.7–2.4)

## 2018-03-22 MED ORDER — INSULIN ASPART 100 UNIT/ML ~~LOC~~ SOLN: 0.0000 [IU] | Freq: Every day | SUBCUTANEOUS | Status: DC

## 2018-03-22 MED ORDER — DEXTROSE 5 % IV SOLN
INTRAVENOUS | Status: DC
  Administered 2018-03-22 (×3): via INTRAVENOUS

## 2018-03-22 MED ORDER — INSULIN ASPART 100 UNIT/ML ~~LOC~~ SOLN
0.0000 [IU] | Freq: Three times a day (TID) | SUBCUTANEOUS | Status: DC
  Administered 2018-03-22: 3 [IU] via SUBCUTANEOUS
  Administered 2018-03-22: 2 [IU] via SUBCUTANEOUS
  Filled 2018-03-22 (×2): qty 1

## 2018-03-22 MED ORDER — PNEUMOCOCCAL VAC POLYVALENT 25 MCG/0.5ML IJ INJ: 0.5000 mL | INJECTION | INTRAMUSCULAR | Status: DC

## 2018-03-22 MED ORDER — DEXTROSE 50 % IV SOLN: 12.0000 mL | Freq: Once | INTRAVENOUS | Status: DC

## 2018-03-22 MED ORDER — NICOTINE 14 MG/24HR TD PT24
14.0000 mg | MEDICATED_PATCH | Freq: Every day | TRANSDERMAL | Status: DC
  Administered 2018-03-22 – 2018-03-24 (×3): 14 mg via TRANSDERMAL
  Filled 2018-03-22 (×2): qty 1

## 2018-03-22 MED ORDER — INSULIN GLARGINE 100 UNIT/ML ~~LOC~~ SOLN
11.0000 [IU] | SUBCUTANEOUS | Status: DC
  Administered 2018-03-22 – 2018-03-24 (×3): 11 [IU] via SUBCUTANEOUS
  Filled 2018-03-22 (×3): qty 0.11

## 2018-03-22 MED ORDER — INSULIN ASPART 100 UNIT/ML ~~LOC~~ SOLN
0.0000 [IU] | Freq: Three times a day (TID) | SUBCUTANEOUS | Status: DC
  Administered 2018-03-23: 5 [IU] via SUBCUTANEOUS
  Administered 2018-03-23: 18:00:00 2 [IU] via SUBCUTANEOUS
  Administered 2018-03-23 – 2018-03-24 (×3): 3 [IU] via SUBCUTANEOUS
  Filled 2018-03-22 (×5): qty 1

## 2018-03-22 MED ORDER — DEXTROSE 50 % IV SOLN
25.0000 mL | Freq: Once | INTRAVENOUS | Status: AC
  Administered 2018-03-22: 25 mL via INTRAVENOUS

## 2018-03-22 MED ORDER — INSULIN ASPART 100 UNIT/ML ~~LOC~~ SOLN
0.0000 [IU] | Freq: Every day | SUBCUTANEOUS | Status: DC
  Administered 2018-03-22: 4 [IU] via SUBCUTANEOUS
  Administered 2018-03-23: 5 [IU] via SUBCUTANEOUS
  Filled 2018-03-22 (×2): qty 1

## 2018-03-22 MED ORDER — DEXTROSE 50 % IV SOLN
INTRAVENOUS | Status: AC
  Administered 2018-03-22: 25 mL via INTRAVENOUS
  Filled 2018-03-22: qty 50

## 2018-03-22 MED ORDER — POTASSIUM CHLORIDE 10 MEQ/100ML IV SOLN
10.0000 meq | INTRAVENOUS | Status: AC
  Administered 2018-03-22 (×3): 10 meq via INTRAVENOUS
  Filled 2018-03-22 (×3): qty 100

## 2018-03-22 MED ORDER — INSULIN ASPART 100 UNIT/ML ~~LOC~~ SOLN
3.0000 [IU] | Freq: Three times a day (TID) | SUBCUTANEOUS | Status: DC
  Administered 2018-03-22 – 2018-03-24 (×6): 3 [IU] via SUBCUTANEOUS
  Filled 2018-03-22 (×5): qty 1

## 2018-03-22 MED ORDER — SODIUM CHLORIDE 0.45 % IV SOLN
INTRAVENOUS | Status: DC
  Administered 2018-03-23: 01:00:00 via INTRAVENOUS

## 2018-03-22 NOTE — Progress Notes (Signed)
CRITICAL CARE NOTE        SUBJECTIVE FINDINGS & SIGNIFICANT EVENTS   Patient remains critically ill Prognosis is guarded Resting in bed comfortably.  Mentation improved now speaking but inconsistently appropriate   PAST MEDICAL HISTORY   Past Medical History:  Diagnosis Date  . Chronic combined systolic (congestive) and diastolic (congestive) heart failure ([place])    a. TTE 6/15: EF < 20%, mildly dilated LV, DD, mildly dilated LA, mod dilated RA, mild MR, mild to mod TR, mod increased posterior wall thickness, elevated LA and LVEDP; b. 4/12019 Echo: EF 20-25%, diff HK. Gr1 DD, nl RV fxn.  . CKD (chronic kidney disease), stage II   . Diabetes mellitus with complication ([place])    a. Prior admissions w/ DKA (last 12/2017).  . Essential hypertension   . NICM (nonischemic cardiomyopathy) ([place])    a. 06/2013 Echo: EF<20%; b. 06/2013 Cath: no signif dzs; c. 04/2017 Echo: 20-25%, gr1 DD.  Marland Kitchen Polysubstance abuse ([place])    a. etoh and tobacco  . Stroke [facility])      SURGICAL HISTORY   Past Surgical History:  Procedure Laterality Date  . NO PAST SURGERIES       FAMILY HISTORY   Family History  Problem Relation Age of Onset  . Congestive Heart Failure Mother   . Diabetes Mother   . Congestive Heart Failure Brother      SOCIAL HISTORY   Social History   Tobacco Use  . Smoking status: Current Every Day Smoker    Packs/day: 0.50    Types: Cigarettes  . Smokeless tobacco: Never Used  Substance Use Topics  . Alcohol use: Not Currently    Frequency: Never  . Drug use: Not on file     MEDICATIONS   Current Medication:  Current Facility-Administered Medications:  .  atorvastatin (LIPITOR) tablet 40 mg, 40 mg, Oral, QHS, Belia Febo, MD .  baclofen (LIORESAL) tablet 10 mg, 10 mg, Oral, BID PRN,  Mansy, Jan A, MD .  Chlorhexidine Gluconate Cloth 2 % PADS 6 each, 6 each, Topical, Once, Rether Rison, MD .  dextrose 5 % solution, , Intravenous, Continuous, Darel Hong D, NP, Last Rate: 125 mL/hr at 03/22/18 0839 .  dextrose 5 %-0.45 % sodium chloride infusion, , Intravenous, Continuous, Hinda Kehr, MD .  dextrose 5 %-0.45 % sodium chloride infusion, , Intravenous, Continuous, Mansy, Jan A, MD .  enoxaparin (LOVENOX) injection 40 mg, 40 mg, Subcutaneous, Daily, Charlett Nose, RPH, 40 mg at 03/22/18 0804 .  insulin aspart (novoLOG) injection 0-5 Units, 0-5 Units, Subcutaneous, QHS, Everlina Gotts, MD .  insulin aspart (novoLOG) injection 0-9 Units, 0-9 Units, Subcutaneous, TID WC, Arin Vanosdol, MD .  insulin glargine (LANTUS) injection 11 Units, 11 Units, Subcutaneous, Q24H, Ottie Glazier, MD, 11 Units at 03/22/18 0804 .  insulin regular, human (MYXREDLIN) 100 units/ 100 mL infusion, , Intravenous, Continuous, Mansy, Jan A, MD, Last Rate: 2.3 mL/hr at 03/22/18 0640, 2.3 Units/hr at 03/22/18 0640 .  multivitamin with minerals tablet 1 tablet, 1 tablet, Oral, Daily, Mansy, Jan A, MD, 1 tablet at 03/22/18 0805 .  norepinephrine (LEVOPHED) 4 mg in dextrose 5 % 250 mL (0.016 mg/mL) infusion, 0-40 mcg/min, Intravenous, Titrated, Mansy, Jan A, MD .  [COMPLETED] sodium chloride 0.9 % bolus 1,000 mL, 1,000 mL, Intravenous, Once, Stopped at 03/21/18 0418 **AND** [COMPLETED] sodium chloride 0.9 % bolus 1,000 mL, 1,000 mL, Intravenous, Once, Stopped at 03/21/18 0611 **AND** sodium chloride 0.9 % bolus 250 mL, 250  mL, Intravenous, Once, Hinda Kehr, MD .  venlafaxine [facility]) tablet 37.5 mg, 37.5 mg, Oral, Daily, Mansy, Jan A, MD, 37.5 mg at 03/22/18 0804    ALLERGIES   Patient has no known allergies.    REVIEW OF SYSTEMS   Unable to perform ROS due to acute illness  PHYSICAL EXAMINATION   Vitals:   03/22/18 0500 03/22/18 0600  BP: 132/86 111/66  Pulse: 70 72  Resp: 14  16  Temp: 36.5 C   SpO2: 93% 92%    GENERAL:NAD HEAD: Normocephalic, atraumatic.  EYES: Pupils equal, round, reactive to light.  No scleral icterus.  MOUTH: Moist mucosal membrane. NECK: Supple. No thyromegaly. No nodules. No JVD.  PULMONARY: ctab CARDIOVASCULAR: S1 and S2. Regular rate and rhythm. No murmurs, rubs, or gallops.  GASTROINTESTINAL: Soft, nontender, non-distended. No masses. Positive bowel sounds. No hepatosplenomegaly.  MUSCULOSKELETAL: No swelling, clubbing, or edema.  NEUROLOGIC: Mild distress due to acute illness SKIN:intact,warm,dry   LABS AND IMAGING     -I personally reviewed most recent blood work, imaging and microbiology - significant findings today are hyperglycemia, hypernatremia  LAB RESULTS: Recent Labs  Lab 03/21/18 1847 03/21/18 2303 03/22/18 0430  NA 156* 158* 155*  K 3.4* 3.2* 3.7  CL 116* 120* 119*  CO2 29 31 30   BUN 61* 56* 48*  CREATININE 1.64* 1.44* 1.18  GLUCOSE 399* 182* 130*   Recent Labs  Lab 03/21/18 0314 03/22/18 0430  HGB 11.7* 13.1  HCT 38.4* 38.9*  WBC 7.9 14.1*  PLT 318 308     IMAGING RESULTS: Dg Chest Port 1 View  Result Date: 03/22/2018 CLINICAL DATA:  Pleural effusion EXAM: PORTABLE CHEST 1 VIEW COMPARISON:  Yesterday FINDINGS: Normal heart size for technique. Stable mediastinal contours. Interstitial coarsening that correlates with airway thickening on CT from yesterday. No air bronchogram, visible effusion, or pneumothorax. Asymmetric pleural thickening at the right apex. IMPRESSION: 1. Mild airway thickening. 2. No pleural effusion in the frontal projection. Electronically Signed   By: Monte Fantasia M.D.   On: 03/22/2018 06:55      ASSESSMENT AND PLAN    Multidisciplinary rounds held today  Altered mental status with confusion -Likely due to diabetic ketoacidosis    -Starting DKA protocol    -Serial BMPs -Conservative fluid strategy due to severe CHF.    Circulatory shock   -Likely due to  hypovolemia secondary to above   -Conservative IV rehydration-most recent TTE April 2019 shows EF of 20 to 25% we will monitor closely for signs of increased oxygen requirement/pulmonary edema   CARDIAC FAILURE- Chronic heart failure with reduced EF at 20 to 25% as well as grade 1 diastolic dysfunction -oxygen as needed -follow up cardiac enzymes as indicated ICU monitoring -BNP 374 on arrival  Renal Failure-most likely due dehydration -follow chem 7 -follow UO -continue Foley Catheter-assess need daily -Baseline creatinine at 0.64 currently at 2.54-AKI stage III    NEUROLOGY - altered mental status with confusion -Likely due to DKA with possible infectious etiology versus noncompliance which seems more likely per history-we will consider lumbar puncture if mentation does not improve post DKA treatment -Banana bag with thiamine for chronic alcoholism/possible Wernicke's encephalopathy  ID -continue empiric IV abx as prescibed -follow up cultures  GI/Nutrition GI PROPHYLAXIS as indicated DIET-->TF's as tolerated Constipation protocol as indicated  ENDO - ICU DKA hypoglycemic\Hyperglycemia protocol -check FSBS per protocol   ELECTROLYTES -follow labs as needed -replace as needed -pharmacy consultation -Monitoring for refeeding syndrome- phos pending  DVT/GI PRX ordered -SCDs  TRANSFUSIONS AS NEEDED MONITOR FSBS ASSESS the need for LABS as needed   Critical care provider statement:    Critical care time (minutes):  35   Critical care time was exclusive of:  Separately billable procedures and treating other patients   Critical care was necessary to treat or prevent imminent or life-threatening deterioration of the following conditions:  CHF, DKA, lethargy , hypernatremia   Critical care was time spent personally by me on the following activities:  Development of treatment plan with patient or surrogate, discussions with consultants, evaluation of  patient's response to treatment, examination of patient, obtaining history from patient or surrogate, ordering and performing treatments and interventions, ordering and review of laboratory studies and re-evaluation of patient's condition.  I assumed direction of critical care for this patient from another provider in my specialty: no    This document was prepared using Dragon voice recognition software and may include unintentional dictation errors.    Ottie Glazier, M.D.  Division of [place]

## 2018-03-22 NOTE — Progress Notes (Signed)
Inpatient Diabetes Program Recommendations  AACE/ADA: New Consensus Statement on Inpatient Glycemic Control (2015)  Target Ranges:  Prepandial:   less than 140 mg/dL      Peak postprandial:   less than 180 mg/dL (1-2 hours)      Critically ill patients:  140 - 180 mg/dL   Lab Results  Component Value Date   GLUCAP 184 (H) 03/22/2018   HGBA1C 12.0 (H) 12/16/2017    Review of Glycemic Control Results for Shawn [name], Shawn [name] (MRN [id number]) as of 03/22/2018 15:14  Ref. Range 03/22/2018 05:49 03/22/2018 06:34 03/22/2018 07:44 03/22/2018 08:48 03/22/2018 11:44  Glucose-Capillary Latest Ref Range: 70 - 99 mg/dL 162 (H) 150 (H) 173 (H) 119 (H) 184 (H)  Diabetes history: DM 2 Outpatient Diabetes medications:  Lantus 15 units q HS, Novolog 5 units tid with meals Current orders for Inpatient glycemic control:  Lantus 11 units daily, Novolog sensitive tid with meals and HS Inpatient Diabetes Program Recommendations:    Consider adding Novolog meal coverage 3 units tid with meals (hold if patient eats less than 50%).   Spoke with patient's wife.  Patient was sleeping.  She states that patient usually takes insulin and checks blood sugars however the 2 days prior to hospitalization he stopped eating and taking his insulin.  She states that she can administer his insulin when he does not take it, however they were concerned about him taking since he was not eating.  I reminded her that the Novolog is rapid acting and it covers food.  Lantus however is basal, meaning it does not cover food, and needs to be administered every 24 hours.  Wife states that she can help be more involved in making sure that patient is checking his blood sugars and administering insulin. I recommended that she assist him or at least watch him check blood sugars and administer insulin when she is able.  Wife verbalized understanding. Will follow.   Thanks  Adah Perl, RN, BC-ADM Inpatient Diabetes Coordinator Pager  [phone] (8a-5p)

## 2018-03-22 NOTE — Progress Notes (Signed)
[place] [facility] [place] NAME: Shawn [name]    MR#:  [id number]  DATE OF BIRTH:  1955/07/23  SUBJECTIVE:  CHIEF COMPLAINT:   Chief Complaint  Patient presents with  . Hyperglycemia  . Altered Mental Status  Patient continues to be lethargic, improved mental status and discussion with intensivist  REVIEW OF SYSTEMS:  CONSTITUTIONAL: No fever, fatigue or weakness.  EYES: No blurred or double vision.  EARS, NOSE, AND THROAT: No tinnitus or ear pain.  RESPIRATORY: No cough, shortness of breath, wheezing or hemoptysis.  CARDIOVASCULAR: No chest pain, orthopnea, edema.  GASTROINTESTINAL: No nausea, vomiting, diarrhea or abdominal pain.  GENITOURINARY: No dysuria, hematuria.  ENDOCRINE: No polyuria, nocturia,  HEMATOLOGY: No anemia, easy bruising or bleeding SKIN: No rash or lesion. MUSCULOSKELETAL: No joint pain or arthritis.   NEUROLOGIC: No tingling, numbness, weakness.  PSYCHIATRY: No anxiety or depression.   ROS  DRUG ALLERGIES:  No Known Allergies  VITALS:  Blood pressure (!) 146/93, pulse 72, temperature 98.2 F (36.8 C), temperature source Oral, resp. rate 20, height 5\' 7"  (1.702 m), weight 60.3 kg, SpO2 97 %.  PHYSICAL EXAMINATION:  GENERAL:  63 y.o.-year-old patient lying in the bed with no acute distress.  EYES: Pupils equal, round, reactive to light and accommodation. No scleral icterus. Extraocular muscles intact.  HEENT: Head atraumatic, normocephalic. Oropharynx and nasopharynx clear.  NECK:  Supple, no jugular venous distention. No thyroid enlargement, no tenderness.  LUNGS: Normal breath sounds bilaterally, no wheezing, rales,rhonchi or crepitation. No use of accessory muscles of respiration.  CARDIOVASCULAR: S1, S2 normal. No murmurs, rubs, or gallops.  ABDOMEN: Soft, nontender, nondistended. Bowel sounds present. No organomegaly or mass.  EXTREMITIES: No pedal edema, cyanosis, or clubbing.  NEUROLOGIC: Cranial nerves  II through XII are intact. Muscle strength 5/5 in all extremities. Sensation intact. Gait not checked.  PSYCHIATRIC: The patient is alert and oriented x 3.  SKIN: No obvious rash, lesion, or ulcer.   Physical Exam LABORATORY PANEL:   CBC Recent Labs  Lab 03/22/18 0430  WBC 14.1*  HGB 13.1  HCT 38.9*  PLT 308   ------------------------------------------------------------------------------------------------------------------  Chemistries  Recent Labs  Lab 03/21/18 0314  03/22/18 1121 03/22/18 1459  NA 140   < > 150* 150*  K 5.3*   < > 3.5 3.7  CL 99   < > 113* 115*  CO2 11*   < > 29 27  GLUCOSE 1,223*   < > 224* 235*  BUN 71*   < > 41* 40*  CREATININE 2.54*   < > 1.13 1.14  CALCIUM 8.5*   < > 8.5* 8.4*  MG  --    < > 2.4  --   AST 26  --   --   --   ALT 19  --   --   --   ALKPHOS 108  --   --   --   BILITOT 1.7*  --   --   --    < > = values in this interval not displayed.   ------------------------------------------------------------------------------------------------------------------  Cardiac Enzymes No results for input(s): TROPONINI in the last 168 hours. ------------------------------------------------------------------------------------------------------------------  RADIOLOGY:  Ct Head Wo Contrast  Result Date: 03/21/2018 CLINICAL DATA:  Unexplained altered level of consciousness EXAM: CT HEAD WITHOUT CONTRAST TECHNIQUE: Contiguous axial images were obtained from the base of the skull through the vertex without intravenous contrast. COMPARISON:  04/27/2017 FINDINGS: Brain: No evidence of acute infarction, hemorrhage, hydrocephalus, extra-axial collection  or mass lesion/mass effect. Bilateral inferior frontal encephalomalacia, posttraumatic appearance. Generalized cerebral volume loss. Vascular: No hyperdense vessel or unexpected calcification. Skull: Normal. Negative for fracture or focal lesion. Sinuses/Orbits: No acute finding. IMPRESSION: 1. No acute finding  or change from 11 months prior. 2. Bilateral inferior frontal encephalomalacia, posttraumatic appearance. Electronically Signed   By: Monte Fantasia M.D.   On: 03/21/2018 05:20   Ct Chest Wo Contrast  Result Date: 03/21/2018 CLINICAL DATA:  Altered mental status. Cough. EXAM: CT CHEST WITHOUT CONTRAST TECHNIQUE: Multidetector CT imaging of the chest was performed following the standard protocol without IV contrast. COMPARISON:  None. FINDINGS: Cardiovascular: Aortic and coronary artery calcific atherosclerosis. Mild cardiomegaly. No pericardial effusion. Normal course and caliber of the aorta. Mediastinum/Nodes: No mediastinal, axillary or hilar lymphadenopathy. Normal thoracic esophageal course. Visualized thyroid is normal. Lungs/Pleura: There is biapical scarring and bibasilar atelectasis. No nodule or mass. No focal consolidation. No pleural effusion or pneumothorax. Upper Abdomen: No acute abnormality. Musculoskeletal: No chest wall mass or suspicious bone lesions identified. IMPRESSION: 1. No acute thoracic abnormality. 2. Mild cardiomegaly with coronary artery calcification and aortic atherosclerosis (ICD10-I70.0). Electronically Signed   By: Ulyses Jarred M.D.   On: 03/21/2018 05:34   Dg Chest Port 1 View  Result Date: 03/22/2018 CLINICAL DATA:  Pleural effusion EXAM: PORTABLE CHEST 1 VIEW COMPARISON:  Yesterday FINDINGS: Normal heart size for technique. Stable mediastinal contours. Interstitial coarsening that correlates with airway thickening on CT from yesterday. No air bronchogram, visible effusion, or pneumothorax. Asymmetric pleural thickening at the right apex. IMPRESSION: 1. Mild airway thickening. 2. No pleural effusion in the frontal projection. Electronically Signed   By: Monte Fantasia M.D.   On: 03/22/2018 06:55   Dg Chest Port 1 View  Result Date: 03/21/2018 CLINICAL DATA:  Cough EXAM: PORTABLE CHEST 1 VIEW COMPARISON:  12/15/2017 FINDINGS: Mild cardiomegaly. No focal airspace  consolidation. No pleural effusion or pneumothorax. IMPRESSION: No active disease. Electronically Signed   By: Ulyses Jarred M.D.   On: 03/21/2018 04:08    ASSESSMENT AND PLAN:  *Acute diabetic ketoacidosis versus hyperosmolar coma Resolved Likely secondary to noncompliance Treated on our DKA protocol in the ICU, anion gap now close, discussed with intensivist-possible transfer to the floor later today or tomorrow  *Acute toxic metabolic encephalopathy Suspected due to above Resolving Increase nursing care PRN, aspiration/fall/skin care precautions while in house, head of bed 30 degrees, neurochecks per routine, and continue close medical monitoring  *Acute hypotension  Secondary to above Resolved with IV fluids for rehydration, antibiotics were discontinued as this was thought not to be due to bacterial infection, influenza negative, CT chest negative for pneumonia,  *AKI with CKD III Resolved with IV fluids rehydration  *Acute hypernatremia, hyperchloremia  IV fluids for rehydration, BMP in the morning   *Chronic systolic congestive heart failure with cardiomyopathy Stable Compensated Continue statin therapy, strict I&O monitoring, daily weights  *Hyperlipidemia unspecified  Stable  Continue atorvastatin  *Chronic anxiety  Continue Effexor   All the records are reviewed and case discussed with Care Management/Social Workerr. Management plans discussed with the patient, family and they are in agreement.  CODE STATUS: full  TOTAL TIME TAKING CARE OF THIS PATIENT: 35 minutes.    POSSIBLE D/C IN 1-3 DAYS, DEPENDING ON CLINICAL CONDITION.   Avel Peace Shaquasia Caponigro M.D on 03/22/2018   Between 7am to 6pm - Pager - [phone]  After 6pm go to [email or web address] - Proofreader  [employer]  [phone]  CC: Primary care physician; [facility] [place]  Note: This dictation was prepared with Dragon dictation along with smaller phrase  technology. Any transcriptional errors that result from this process are unintentional.

## 2018-03-22 NOTE — TOC Progression Note (Signed)
Transition of Care [facility]) - Progression Note    Patient Details  Name: Shawn [name] MRN: [id number] Date of Birth: [birth year]  Transition of Care [facility] CM/SW Contact  Katrina Stack, RN Phone Number: 03/22/2018, 5:49 PM  Clinical Narrative:   Heads up referral to [facility]. RN and SW    Expected Discharge Plan: [place]    Expected Discharge Plan and Services Expected Discharge Plan: [place] Discharge Planning Services: CM Consult Post Acute Care Choice: [place] arrangements for the past 2 months: Single Family Home                           Social Determinants of Health (SDOH) Interventions    Readmission Risk Interventions 30 Day Unplanned Readmission Risk Score     ED to Hosp-Admission (Current) from 03/21/2018 in [place] ICU/CCU  30 Day Unplanned Readmission Risk Score (%)  27 Filed at 03/22/2018 1600     This score is the patient's risk of an unplanned readmission within 30 days of being discharged (0 -100%). The score is based on dignosis, age, lab data, medications, orders, and past utilization.   Low:  0-14.9   Medium: 15-21.9   High: 22-29.9   Extreme: 30 and above       Readmission Risk Prevention Plan 12/17/2017  Transportation Screening Complete  PCP or Specialist Appt within 3-5 Days Complete  Home Care Screening Complete  Social Work Consult for [place] Planning/Counseling Patient refused  Palliative Care Screening Patient refused  Medication Review Press photographer) Complete  Some recent data might be hidden

## 2018-03-23 ENCOUNTER — Encounter: Payer: Self-pay | Admitting: Pulmonary Disease

## 2018-03-23 LAB — BASIC METABOLIC PANEL
ANION GAP: 6 (ref 5–15)
ANION GAP: 5 (ref 5–15)
Anion gap: 6 (ref 5–15)
Anion gap: 5 (ref 5–15)
BUN: 30 mg/dL — ABNORMAL HIGH (ref 8–23)
BUN: 30 mg/dL — ABNORMAL HIGH (ref 8–23)
BUN: 30 mg/dL — ABNORMAL HIGH (ref 8–23)
BUN: 27 mg/dL — ABNORMAL HIGH (ref 8–23)
CALCIUM: 8.5 mg/dL — AB (ref 8.9–10.3)
CALCIUM: 8.6 mg/dL — AB (ref 8.9–10.3)
CHLORIDE: 107 mmol/L (ref 98–111)
CHLORIDE: 107 mmol/L (ref 98–111)
CO2: 29 mmol/L (ref 22–32)
CO2: 28 mmol/L (ref 22–32)
CO2: 29 mmol/L (ref 22–32)
CO2: 32 mmol/L (ref 22–32)
CREATININE: 0.79 mg/dL (ref 0.61–1.24)
Calcium: 8.5 mg/dL — ABNORMAL LOW (ref 8.9–10.3)
Calcium: 8.5 mg/dL — ABNORMAL LOW (ref 8.9–10.3)
Chloride: 105 mmol/L (ref 98–111)
Chloride: 105 mmol/L (ref 98–111)
Creatinine, Ser: 0.89 mg/dL (ref 0.61–1.24)
Creatinine, Ser: 0.79 mg/dL (ref 0.61–1.24)
Creatinine, Ser: 0.86 mg/dL (ref 0.61–1.24)
GFR calc Af Amer: >60 mL/min (ref >60)
GFR calc Af Amer: >60 mL/min (ref >60)
GFR calc non Af Amer: >60 mL/min (ref >60)
GFR calc non Af Amer: >60 mL/min (ref >60)
GFR calc non Af Amer: >60 mL/min (ref >60)
GFR calc non Af Amer: >60 mL/min (ref >60)
Glucose, Bld: 283 mg/dL — ABNORMAL HIGH (ref 70–99)
Glucose, Bld: 303 mg/dL — ABNORMAL HIGH (ref 70–99)
Glucose, Bld: 253 mg/dL — ABNORMAL HIGH (ref 70–99)
Glucose, Bld: 183 mg/dL — ABNORMAL HIGH (ref 70–99)
POTASSIUM: 3.8 mmol/L (ref 3.5–5.1)
Potassium: 4 mmol/L (ref 3.5–5.1)
Potassium: 3.7 mmol/L (ref 3.5–5.1)
Potassium: 3.8 mmol/L (ref 3.5–5.1)
Sodium: 142 mmol/L (ref 135–145)
Sodium: 140 mmol/L (ref 135–145)
Sodium: 140 mmol/L (ref 135–145)
Sodium: 142 mmol/L (ref 135–145)

## 2018-03-23 LAB — CBC
HCT: 36.9 % — ABNORMAL LOW (ref 39.0–52.0)
Hemoglobin: 12.1 g/dL — ABNORMAL LOW (ref 13.0–17.0)
MCH: 30 pg (ref 26.0–34.0)
MCHC: 32.8 g/dL (ref 30.0–36.0)
MCV: 91.6 fL (ref 80.0–100.0)
Platelets: 251 10*3/uL (ref 150–400)
RBC: 4.03 MIL/uL — ABNORMAL LOW (ref 4.22–5.81)
RDW: 14.1 % (ref 11.5–15.5)
WBC: 8.6 10*3/uL (ref 4.0–10.5)
nRBC: 0 % (ref 0.0–0.2)

## 2018-03-23 LAB — GLUCOSE, CAPILLARY
Glucose-Capillary: >600 mg/dL (ref 70–99)
Glucose-Capillary: 280 mg/dL — ABNORMAL HIGH (ref 70–99)
Glucose-Capillary: 220 mg/dL — ABNORMAL HIGH (ref 70–99)
Glucose-Capillary: 157 mg/dL — ABNORMAL HIGH (ref 70–99)
Glucose-Capillary: 234 mg/dL — ABNORMAL HIGH (ref 70–99)

## 2018-03-23 MED ORDER — FUROSEMIDE 10 MG/ML IJ SOLN
40.0000 mg | Freq: Once | INTRAMUSCULAR | Status: AC
  Administered 2018-03-23: 40 mg via INTRAVENOUS
  Filled 2018-03-23: qty 4

## 2018-03-23 NOTE — Progress Notes (Signed)
Inpatient Diabetes Program Recommendations  AACE/ADA: New Consensus Statement on Inpatient Glycemic Control (2015)  Target Ranges:  Prepandial:   less than 140 mg/dL      Peak postprandial:   less than 180 mg/dL (1-2 hours)      Critically ill patients:  140 - 180 mg/dL   Lab Results  Component Value Date   GLUCAP 280 (H) 03/23/2018   HGBA1C 12.0 (H) 12/16/2017    Review of Glycemic Control Results for Shawn [name], Shawn [name] (MRN [id number]) as of 03/23/2018 08:05  Ref. Range 03/22/2018 08:48 03/22/2018 11:44 03/22/2018 16:06 03/22/2018 22:09 03/23/2018 07:24  Glucose-Capillary Latest Ref Range: 70 - 99 mg/dL 119 (H) 184 (H) 230 (H) 342 (H) 280 (H)  Diabetes history:DM 2 Outpatient Diabetes medications: Lantus 15 units q HS, Novolog 5 units tid with meals Current orders for Inpatient glycemic control: Lantus 11 units daily, Novolog sensitive tid with meals and HS Inpatient Diabetes Program Recommendations:    -Please increase Lantus to 15 units daily.  Also add Novolog meal coverage 3 units tid with meals.   Patient and wife would benefit from Home health RN. Patient will need extra support at home to take care of his diabetes, checking blood sugars and administering insulin.   Thanks,  Adah Perl, RN, BC-ADM Inpatient Diabetes Coordinator Pager [phone] (8a-5p)

## 2018-03-23 NOTE — Progress Notes (Addendum)
[place] at [place] NAME: Shawn [name]    MR#:  [id number]  DATE OF BIRTH:  [birth year]  SUBJECTIVE:  CHIEF COMPLAINT:   Chief Complaint  Patient presents with  . Hyperglycemia  . Altered Mental Status  Patient without complaint, wife at the bedside, doing much better, weaned off insulin drip, for transfer to regular nursing floor later today, physical therapy to see   REVIEW OF SYSTEMS:  CONSTITUTIONAL: No fever, fatigue or weakness.  EYES: No blurred or double vision.  EARS, NOSE, AND THROAT: No tinnitus or ear pain.  RESPIRATORY: No cough, shortness of breath, wheezing or hemoptysis.  CARDIOVASCULAR: No chest pain, orthopnea, edema.  GASTROINTESTINAL: No nausea, vomiting, diarrhea or abdominal pain.  GENITOURINARY: No dysuria, hematuria.  ENDOCRINE: No polyuria, nocturia,  HEMATOLOGY: No anemia, easy bruising or bleeding SKIN: No rash or lesion. MUSCULOSKELETAL: No joint pain or arthritis.   NEUROLOGIC: No tingling, numbness, weakness.  PSYCHIATRY: No anxiety or depression.   ROS  DRUG ALLERGIES:  No Known Allergies  VITALS:  Blood pressure 119/83, pulse 75, temperature 98.2 F (36.8 C), temperature source Oral, resp. rate 20, height 5\' 7"  (1.702 m), weight 60.3 kg, SpO2 99 %.  PHYSICAL EXAMINATION:  GENERAL:  63 y.o.-year-old patient lying in the bed with no acute distress.  EYES: Pupils equal, round, reactive to light and accommodation. No scleral icterus. Extraocular muscles intact.  HEENT: Head atraumatic, normocephalic. Oropharynx and nasopharynx clear.  NECK:  Supple, no jugular venous distention. No thyroid enlargement, no tenderness.  LUNGS: Normal breath sounds bilaterally, no wheezing, rales,rhonchi or crepitation. No use of accessory muscles of respiration.  CARDIOVASCULAR: S1, S2 normal. No murmurs, rubs, or gallops.  ABDOMEN: Soft, nontender, nondistended. Bowel sounds present. No organomegaly or mass.   EXTREMITIES: No pedal edema, cyanosis, or clubbing.  NEUROLOGIC: Cranial nerves II through XII are intact. Muscle strength 5/5 in all extremities. Sensation intact. Gait not checked.  PSYCHIATRIC: The patient is alert and oriented x 3.  SKIN: No obvious rash, lesion, or ulcer.   Physical Exam LABORATORY PANEL:   CBC Recent Labs  Lab 03/23/18 0354  WBC 8.6  HGB 12.1*  HCT 36.9*  PLT 251   ------------------------------------------------------------------------------------------------------------------  Chemistries  Recent Labs  Lab 03/21/18 0314  03/22/18 1121  03/23/18 1103  NA 140   < > 150*   < > 140  K 5.3*   < > 3.5   < > 3.7  CL 99   < > 113*   < > 105  CO2 11*   < > 29   < > 29  GLUCOSE 1,223*   < > 224*   < > 253*  BUN 71*   < > 41*   < > 30*  CREATININE 2.54*   < > 1.13   < > 0.79  CALCIUM 8.5*   < > 8.5*   < > 8.5*  MG  --    < > 2.4  --   --   AST 26  --   --   --   --   ALT 19  --   --   --   --   ALKPHOS 108  --   --   --   --   BILITOT 1.7*  --   --   --   --    < > = values in this interval not displayed.   ------------------------------------------------------------------------------------------------------------------  Cardiac Enzymes No results for input(s): TROPONINI  in the last 168 hours. ------------------------------------------------------------------------------------------------------------------  RADIOLOGY:  Dg Chest Port 1 View  Result Date: 03/22/2018 CLINICAL DATA:  Pleural effusion EXAM: PORTABLE CHEST 1 VIEW COMPARISON:  Yesterday FINDINGS: Normal heart size for technique. Stable mediastinal contours. Interstitial coarsening that correlates with airway thickening on CT from yesterday. No air bronchogram, visible effusion, or pneumothorax. Asymmetric pleural thickening at the right apex. IMPRESSION: 1. Mild airway thickening. 2. No pleural effusion in the frontal projection. Electronically Signed   By: Monte Fantasia M.D.   On:  03/22/2018 06:55    ASSESSMENT AND PLAN:  *Acute diabetic ketoacidosis versus hyperosmolar coma Resolved Likely secondary to noncompliance Treated on our DKA protocol in the ICU, weaned off insulin drip, for transfer to floor later today, diabetic educator while in house   *Acute toxic metabolic encephalopathy Resolved Suspected due to above Physical therapy to see  *Acute hypotension  Solved with IV fluids rehydration Secondary to above antibiotics were discontinued as this was thought not to be due to bacterial infection, influenza negative, CT chest negative for pneumonia  *AKI with CKD III Resolved with IV fluids rehydration  *Acute hypernatremia, hyperchloremia  Resolved with IV fluids for rehydration  *Chronic systolic congestive heart failure with cardiomyopathy Stable Compensated Continue statin therapy, strict I&O monitoring, daily weights  *Hyperlipidemia unspecified  Stable  Continue atorvastatin  *Chronic anxiety  Continue Effexor  Physician at home on tomorrow barring any complications  All the records are reviewed and case discussed with Care Management/Social Workerr. Management plans discussed with the patient, family and they are in agreement.  CODE STATUS: full  TOTAL TIME TAKING CARE OF THIS PATIENT: 35 minutes.    POSSIBLE D/C IN 1-3 DAYS, DEPENDING ON CLINICAL CONDITION.   Avel Peace Janeya Deyo M.D on 03/23/2018   Between 7am to 6pm - Pager - [phone]  After 6pm go to [email or web address] - password [email or web address] [facility]  Office  [phone]  CC: Primary care physician; [place]  Note: This dictation was prepared with Dragon dictation along with smaller phrase technology. Any transcriptional errors that result from this process are unintentional.

## 2018-03-23 NOTE — Evaluation (Signed)
Physical Therapy Evaluation Patient Details Name: Shawn [name] MRN: [id number] DOB: 22-Sep-1955 Today's Date: 03/23/2018   History of Present Illness  64 y/o male here with diabetic ketoacidosis.  Pt transfered out of CCU earlier today as mental status has improved.    Clinical Impression  Pt did relatively well with PT exam this date.  He was able to get to EOB w/o assist and rose to standing with cane and some extra time/cuing to keep weight forward.  He did initially lean back onto bed to maintain balance, but ultimately was able to shift weight to cane and did well with 200 ft of gait training with CGA and regular cuing.  PT instructed him to use walker at home secondary to some unsteadiness, but ultimately he did well and should be able to return home with wife once medically cleared, discharge likely tomorrow.      Follow Up Recommendations Home health PT    Equipment Recommendations  None recommended by PT    Recommendations for Other Services       Precautions / Restrictions Precautions Precautions: Fall Restrictions Weight Bearing Restrictions: No      Mobility  Bed Mobility Overal bed mobility: Modified Independent             General bed mobility comments: Pt slow to get to sitting, but did not require phyiscal asssist  Transfers Overall transfer level: Modified independent Equipment used: Straight cane             General transfer comment: Pt intially leaning backward onto bed but was able to maintain standing without phyiscal assist.  Ambulation/Gait   Gait Distance (Feet): 200 Feet Assistive device: Straight cane       General Gait Details: Pt was able to maintain relatively consistent cadence but did have multiple stagger steps that he was able to self arrest but which prompted PT to suggest he use walker for now and initially when he gets home.  Pt's O2 remained in the 90s t/o the effort on room air.  Stairs            Wheelchair  Mobility    Modified Rankin (Stroke Patients Only)       Balance Overall balance assessment: Modified Independent                                           Pertinent Vitals/Pain Pain Assessment: No/denies pain    Home Living Family/patient expects to be discharged to:: Private residence Living Arrangements: Spouse/significant other Available Help at Discharge: Available PRN/intermittently;Family Type of Home: House Home Access: Stairs to enter(also has a ramp?) Entrance Stairs-Rails: Right Entrance Stairs-Number of Steps: 3 Home Layout: One level Home Equipment: Cane - single point;Walker - 2 wheels      Prior Function Level of Independence: Independent         Comments: Ind amb limited community distances has had 1 fall in the last 6 months     Hand Dominance        Extremity/Trunk Assessment   Upper Extremity Assessment Upper Extremity Assessment: Overall WFL for tasks assessed;Generalized weakness    Lower Extremity Assessment Lower Extremity Assessment: Overall WFL for tasks assessed;Generalized weakness       Communication   Communication: No difficulties  Cognition Arousal/Alertness: Awake/alert Behavior During Therapy: WFL for tasks assessed/performed Overall Cognitive Status: Within Functional Limits for tasks assessed  General Comments      Exercises     Assessment/Plan    PT Assessment Patient needs continued PT services  PT Problem List Decreased strength;Decreased activity tolerance;Decreased range of motion;Decreased mobility;Decreased balance;Decreased coordination;Decreased knowledge of use of DME;Decreased knowledge of precautions;Decreased safety awareness       PT Treatment Interventions DME instruction;Gait training;Stair training;Functional mobility training;Therapeutic activities;Therapeutic exercise;Balance training;Neuromuscular  re-education;Patient/family education    PT Goals (Current goals can be found in the Care Plan section)  Acute Rehab PT Goals Patient Stated Goal: go home PT Goal Formulation: With patient Time For Goal Achievement: 04/06/18 Potential to Achieve Goals: Good    Frequency Min 2X/week   Barriers to discharge        Co-evaluation               AM-PAC PT "6 Clicks" Mobility  Outcome Measure Help needed turning from your back to your side while in a flat bed without using bedrails?: None Help needed moving from lying on your back to sitting on the side of a flat bed without using bedrails?: None Help needed moving to and from a bed to a chair (including a wheelchair)?: None Help needed standing up from a chair using your arms (e.g., wheelchair or bedside chair)?: None Help needed to walk in hospital room?: None Help needed climbing 3-5 steps with a railing? : A Little 6 Click Score: 23    End of Session Equipment Utilized During Treatment: Gait belt Activity Tolerance: Patient limited by fatigue;Patient tolerated treatment well Patient left: with chair alarm set;with call bell/phone within reach;with family/visitor present Nurse Communication: Mobility status PT Visit Diagnosis: Muscle weakness (generalized) (M62.81);Difficulty in walking, not elsewhere classified (R26.2);Unsteadiness on feet (R26.81)    Time: 1415-1440 PT Time Calculation (min) (ACUTE ONLY): 25 min   Charges:   PT Evaluation $PT Eval Low Complexity: 1 Low PT Treatments $Gait Training: 8-22 mins        Kreg Shropshire, DPT 03/23/2018, 3:29 PM

## 2018-03-23 NOTE — Progress Notes (Signed)
Report called to [facility] on 1C, writing RN will DC foley catheter and replace w/condom cath prior to transport.  Patient agreeable to removal of foley.  VSS on room air.  Cardiac monitoring order [facility], no need for telemetry per MD Aleskerov.  Patient w/NSR and agreeable to transfer

## 2018-03-23 NOTE — Plan of Care (Signed)
Pt transferred today from ICU. VSS. A&Ox4. CBG level stable. Ambulated around the nursing station with PT.

## 2018-03-23 NOTE — Progress Notes (Signed)
CRITICAL CARE NOTE        SUBJECTIVE FINDINGS & SIGNIFICANT EVENTS   Mentation back to baseline this am, patient able to answer appropriately without encouragment.  Will plan to downgrade to medical floor.    PAST MEDICAL HISTORY   Past Medical History:  Diagnosis Date  . Chronic combined systolic (congestive) and diastolic (congestive) heart failure ([place])    a. TTE 6/15: EF < 20%, mildly dilated LV, DD, mildly dilated LA, mod dilated RA, mild MR, mild to mod TR, mod increased posterior wall thickness, elevated LA and LVEDP; b. 4/12019 Echo: EF 20-25%, diff HK. Gr1 DD, nl RV fxn.  . CKD (chronic kidney disease), stage II   . Diabetes mellitus with complication ([place])    a. Prior admissions w/ DKA (last 12/2017).  . Essential hypertension   . NICM (nonischemic cardiomyopathy) ([place])    a. 06/2013 Echo: EF<20%; b. 06/2013 Cath: no signif dzs; c. 04/2017 Echo: 20-25%, gr1 DD.  Marland Kitchen Polysubstance abuse ([place])    a. etoh and tobacco  . Stroke [facility])      SURGICAL HISTORY   Past Surgical History:  Procedure Laterality Date  . NO PAST SURGERIES       FAMILY HISTORY   Family History  Problem Relation Age of Onset  . Congestive Heart Failure Mother   . Diabetes Mother   . Congestive Heart Failure Brother      SOCIAL HISTORY   Social History   Tobacco Use  . Smoking status: Current Every Day Smoker    Packs/day: 0.50    Types: Cigarettes  . Smokeless tobacco: Never Used  Substance Use Topics  . Alcohol use: Not Currently    Frequency: Never  . Drug use: Not on file     MEDICATIONS   Current Medication:  Current Facility-Administered Medications:  .  atorvastatin (LIPITOR) tablet 40 mg, 40 mg, Oral, QHS, Matisha Termine, MD, 40 mg at 03/22/18 2219 .  baclofen (LIORESAL) tablet 10 mg, 10 mg, Oral,  BID PRN, Mansy, Jan A, MD .  enoxaparin (LOVENOX) injection 40 mg, 40 mg, Subcutaneous, Daily, Charlett Nose, RPH, 40 mg at 03/23/18 0739 .  insulin aspart (novoLOG) injection 0-5 Units, 0-5 Units, Subcutaneous, QHS, Awilda Bill, NP, 4 Units at 03/22/18 2216 .  insulin aspart (novoLOG) injection 0-9 Units, 0-9 Units, Subcutaneous, TID WC, Awilda Bill, NP, 5 Units at 03/23/18 0739 .  insulin aspart (novoLOG) injection 3 Units, 3 Units, Subcutaneous, TID WC, Awilda Bill, NP, 3 Units at 03/23/18 0739 .  insulin glargine (LANTUS) injection 11 Units, 11 Units, Subcutaneous, Q24H, Ottie Glazier, MD, 11 Units at 03/23/18 0754 .  multivitamin with minerals tablet 1 tablet, 1 tablet, Oral, Daily, Mansy, Jan A, MD, 1 tablet at 03/23/18 0741 .  nicotine (NICODERM CQ - dosed in mg/24 hours) patch 14 mg, 14 mg, Transdermal, Daily, Lanney Gins, Parsa Rickett, MD, 14 mg at 03/23/18 0740 .  pneumococcal 23 valent vaccine (PNU-IMMUNE) injection 0.5 mL, 0.5 mL, Intramuscular, Tomorrow-1000, Darel Hong D, NP .  [COMPLETED] sodium chloride 0.9 % bolus 1,000 mL, 1,000 mL, Intravenous, Once, Stopped at 03/21/18 0418 **AND** [COMPLETED] sodium chloride 0.9 % bolus 1,000 mL, 1,000 mL, Intravenous, Once, Stopped at 03/21/18 0611 **AND** sodium chloride 0.9 % bolus 250 mL, 250 mL, Intravenous, Once, Hinda Kehr, MD .  venlafaxine [facility]) tablet 37.5 mg, 37.5 mg, Oral, Daily, Mansy, Jan A, MD, 37.5 mg at 03/23/18 0741    ALLERGIES   Patient has no known  allergies.    REVIEW OF SYSTEMS    10 system ros neg except as per subjective findigs  PHYSICAL EXAMINATION   Vitals:   03/23/18 0700 03/23/18 0800  BP: 124/74 122/81  Pulse: 69 73  Resp: 14 (!) 21  Temp:  36.6 C  SpO2: 95% 93%    GENERAL:nad HEAD: Normocephalic, atraumatic.  EYES: Pupils equal, round, reactive to light.  No scleral icterus.  MOUTH: Moist mucosal membrane. NECK: Supple. No thyromegaly. No nodules. No JVD.   PULMONARY: ctab CARDIOVASCULAR: S1 and S2. Regular rate and rhythm. No murmurs, rubs, or gallops.  GASTROINTESTINAL: Soft, nontender, non-distended. No masses. Positive bowel sounds. No hepatosplenomegaly.  MUSCULOSKELETAL: No swelling, clubbing, or edema.  NEUROLOGIC: Mild distress due to acute illness SKIN:intact,warm,dry   LABS AND IMAGING     LAB RESULTS: Recent Labs  Lab 03/22/18 2317 03/23/18 0354 03/23/18 0652  NA 141 142 140  K 3.6 3.8 4.0  CL 107 107 107  CO2 28 29 28   BUN 34* 30* 30*  CREATININE 0.96 0.89 0.79  GLUCOSE 356* 283* 303*   Recent Labs  Lab 03/21/18 0314 03/22/18 0430 03/23/18 0354  HGB 11.7* 13.1 12.1*  HCT 38.4* 38.9* 36.9*  WBC 7.9 14.1* 8.6  PLT 318 308 251     IMAGING RESULTS: No results found.    ASSESSMENT AND PLAN   Multidisciplinary rounds held today  Altered mental status with confusion Resolved -likely due to hypernatremia and DKA -Conservative fluid strategy due to severe CHF.   Circulatory shock -resolved -Likely due to hypovolemia secondary to above -ConservativeIV rehydration-most recent TTE April 2019 shows EF of 20 to 25% we will monitor closely for signs of increased oxygen requirement/pulmonary edema   CARDIAC FAILURE- Chronic heart failure with reduced EF at 20 to 25% as well as grade 1 diastolic dysfunction -mildly edematous in all 4 extremeties this am - will give one time dose lasix this am -oxygen as needed   Renal Failure-most likely duedehydration -resolved -follow chem 7 -follow UO -continue Foley Catheter-assess need daily -Baseline creatinine at 0.64 currently at 2.54-AKI stage III    NEUROLOGY -altered mental status with confusion- resolved -Banana bag with thiamine for chronic alcoholism/possible Wernicke's encephalopathy  ID -septic workup negative - stopped antibiotics -follow up cultures  GI/Nutrition GI PROPHYLAXIS as indicated DIET-->TF's as tolerated  Constipation protocol as indicated  ENDO - ICUDKAhypoglycemic\Hyperglycemia protocol -check FSBS per protocol   ELECTROLYTES -follow labs as needed -replace as needed -pharmacy consultation -Monitoring for refeeding syndrome- phos 1.6-hx of alcoholism   DVT/GI PRX ordered -SCDs  TRANSFUSIONS AS NEEDED MONITOR FSBS ASSESS the need for LABS as needed    This document was prepared using Dragon voice recognition software and may include unintentional dictation errors.    Ottie Glazier, M.D.  Division of [place]

## 2018-03-24 ENCOUNTER — Telehealth: Payer: Self-pay | Admitting: Cardiovascular Disease

## 2018-03-24 LAB — GLUCOSE, CAPILLARY
Glucose-Capillary: 196 mg/dL — ABNORMAL HIGH (ref 70–99)
Glucose-Capillary: 226 mg/dL — ABNORMAL HIGH (ref 70–99)
Glucose-Capillary: 228 mg/dL — ABNORMAL HIGH (ref 70–99)

## 2018-03-24 MED ORDER — PHENOL 1.4 % MT LIQD
1.0000 | OROMUCOSAL | Status: DC | PRN
  Filled 2018-03-24: qty 177

## 2018-03-24 MED ORDER — INSULIN GLARGINE 100 UNIT/ML ~~LOC~~ SOLN: 15.0000 [IU] | SUBCUTANEOUS | Status: DC

## 2018-03-24 MED ORDER — ATORVASTATIN CALCIUM 40 MG PO TABS: 40.0000 mg | ORAL_TABLET | Freq: Every day | ORAL | 0 refills | Status: DC

## 2018-03-24 MED ORDER — NICOTINE 14 MG/24HR TD PT24: 14.0000 mg | MEDICATED_PATCH | Freq: Every day | TRANSDERMAL | 0 refills | Status: DC

## 2018-03-24 NOTE — TOC Transition Note (Signed)
Transition of Care [facility]) - CM/SW Discharge Note   Patient Details  Name: DEVIN GANAWAY MRN: [id number] Date of Birth: [birth year]  [facility]) CM/SW Contact:  Shelbie Ammons, RN Phone Number: 03/24/2018, 1:36 PM   Clinical Narrative: Admitted to [facility] with the diagnosis of DKA. Lives with wife.  Sees Dr. Donata Duff the [facility] in [place] Discharge to home today per Dr. Jerelyn Charles.             Patient Goals and CMS Choice   CMS Medicare.gov Compare Post Acute Care list provided to:: Patient Represenative (must comment)(Wife) Choice offered to / list presented to : Spouse  Chose [facility]. Will update Sharmon Revere, [facility] representative.  Will fax orders to Dr. Amparo [place] Office for authorization.  Discharge Placement home                       Discharge Plan and Services   Discharge Planning Services: CM Consult Post Acute Care Choice: [place]                Social Determinants of Health (SDOH) Interventions Lives with supportive wife.     Readmission Risk Interventions Readmission Risk Prevention Plan 12/17/2017  Transportation Screening Complete  PCP or Specialist Appt within 3-5 Days Complete  Home Care Screening Complete  Social Work Consult for [place] Planning/Counseling Patient refused  Palliative Care Screening Patient refused  Medication Review Press [employer]) Complete  Some recent data might be hidden

## 2018-03-24 NOTE — Telephone Encounter (Signed)
Patient wife calling to schedule ov s/p recent hospital admission .  Please advise.

## 2018-03-24 NOTE — Discharge Summary (Signed)
[facility] [place] NAME: Shawn [name]    MR#:  [id number]  DATE OF BIRTH:  [birth year]  DATE OF ADMISSION:  03/21/2018 ADMITTING PHYSICIAN: Harrie Foreman, MD  DATE OF DISCHARGE: No discharge date for patient encounter.  PRIMARY CARE PHYSICIAN: [place]    ADMISSION DIAGNOSIS:  Metabolic acidosis [J28.7] Hypothermia, initial encounter [T68.XXXA] Hypotension, unspecified hypotension type [I95.9] Acute renal failure, unspecified acute renal failure type ([place]) [N17.9] Altered mental status, unspecified altered mental status type [R41.82] Diabetic ketoacidosis with coma associated with type 2 diabetes mellitus ([place]) [E11.11] Heart failure, unspecified HF chronicity, unspecified heart failure type ([place]) [I50.9]  DISCHARGE DIAGNOSIS:  Active Problems:   DKA (diabetic ketoacidoses) ([place])   SECONDARY DIAGNOSIS:   Past Medical History:  Diagnosis Date  . Chronic combined systolic (congestive) and diastolic (congestive) heart failure ([place])    a. TTE 6/15: EF < 20%, mildly dilated LV, DD, mildly dilated LA, mod dilated RA, mild MR, mild to mod TR, mod increased posterior wall thickness, elevated LA and LVEDP; b. 4/12019 Echo: EF 20-25%, diff HK. Gr1 DD, nl RV fxn.  . CKD (chronic kidney disease), stage II   . Diabetes mellitus with complication ([place])    a. Prior admissions w/ DKA (last 12/2017).  . Essential hypertension   . NICM (nonischemic cardiomyopathy) ([place])    a. 06/2013 Echo: EF<20%; b. 06/2013 Cath: no signif dzs; c. 04/2017 Echo: 20-25%, gr1 DD.  Marland Kitchen Polysubstance abuse ([place])    a. etoh and tobacco  . Stroke [facility])     HOSPITAL COURSE:  *Acute diabetic ketoacidosis versus hyperosmolar coma Resolved Likely secondary to noncompliance Treated on our DKA protocol in the ICU, weaned off insulin drip, transition to floor March 23, 2018, educated on the importance of compliance with medical management,  patient did well and subsequently discharged home with outpatient follow-up with primary care provider in 3 to 5 days for reevaluation    *Acute toxic metabolic encephalopathy Resolved Suspected due to above Physical therapy did see patient while in house-recommended home health PT status post discharge  *Acute hypotension  Resolved with IV fluids rehydration Secondary to above antibiotics were discontinued as this was thought not to be due to bacterial infection, influenza negative, CT chest negative for pneumonia  *AKI with CKD III Resolved with IV fluids rehydration  *Acute hypernatremia, hyperchloremia  Resolved with IV fluids for rehydration  *Chronic systolic congestive heart failure with cardiomyopathy Stable Compensated Continued statin therapy, strict I&O monitoring, daily weights, to follow-up with cardiology status post discharge for reevaluation  *Hyperlipidemia unspecified  Stable  Continued atorvastatin  *Chronic anxiety  Continued Effexor  DISCHARGE CONDITIONS:   stable  CONSULTS OBTAINED:    DRUG ALLERGIES:  No Known Allergies  DISCHARGE MEDICATIONS:   Allergies as of 03/24/2018   No Known Allergies     Medication List    TAKE these medications   aspirin 81 MG chewable tablet Chew 1 tablet (81 mg total) by mouth daily.   atorvastatin 40 MG tablet Commonly known as:  LIPITOR Take 1 tablet (40 mg total) by mouth at bedtime. What changed:    medication strength  how much to take   baclofen 10 MG tablet Commonly known as:  LIORESAL Take 10 mg by mouth 2 (two) times daily as needed for muscle spasms. Muscle spasm   carvedilol 12.5 MG tablet Commonly known as:  COREG Take 12.5 mg by mouth 2 (two) times daily with  a meal.   cetirizine 10 MG tablet Commonly known as:  ZYRTEC TAKE 1 TABLET BY MOUTH DAILY   furosemide 40 MG tablet Commonly known as:  LASIX Take 40 mg by mouth 2 (two) times daily.   hydrALAZINE 100 MG  tablet Commonly known as:  APRESOLINE Take 100 mg by mouth 2 (two) times daily.   insulin aspart 100 UNIT/ML injection Commonly known as:  novoLOG Inject 5 Units into the skin 3 (three) times daily with meals.   insulin glargine 100 UNIT/ML injection Commonly known as:  LANTUS Inject 15 Units into the skin at bedtime.   multivitamin with minerals Tabs tablet Take 1 tablet by mouth daily.   nicotine 14 mg/24hr patch Commonly known as:  NICODERM CQ - dosed in mg/24 hours Place 1 patch (14 mg total) onto the skin daily. Start taking on:  March 25, 2018   sacubitril-valsartan 97-103 MG Commonly known as:  ENTRESTO Take 1 tablet by mouth 2 (two) times daily.   spironolactone 25 MG tablet Commonly known as:  ALDACTONE Take 25 mg by mouth at bedtime.   venlafaxine 37.5 MG tablet Commonly known as:  EFFEXOR Take 37.5 mg by mouth daily.        DISCHARGE INSTRUCTIONS:      If you experience worsening of your admission symptoms, develop shortness of breath, life threatening emergency, suicidal or homicidal thoughts you must seek medical attention immediately by calling 911 or calling your MD immediately  if symptoms less severe.  You Must read complete instructions/literature along with all the possible adverse reactions/side effects for all the Medicines you take and that have been prescribed to you. Take any new Medicines after you have completely understood and accept all the possible adverse reactions/side effects.   Please note  You were cared for by a hospitalist during your hospital stay. If you have any questions about your discharge medications or the care you received while you were in the hospital after you are discharged, you can call the unit and asked to speak with the hospitalist on call if the hospitalist that took care of you is not available. Once you are discharged, your primary care physician will handle any further medical issues. Please note that NO REFILLS  for any discharge medications will be authorized once you are discharged, as it is imperative that you return to your primary care physician (or establish a relationship with a primary care physician if you do not have one) for your aftercare needs so that they can reassess your need for medications and monitor your lab values.    Today   CHIEF COMPLAINT:   Chief Complaint  Patient presents with  . Hyperglycemia  . Altered Mental Status    HISTORY OF PRESENT ILLNESS:  63 y.o. male with a known history of diabetes mellitus, hypertension, systolic CHF and nonischemic cardiomyopathy, polysubstance abuse and CVA as well as stage II chronic kidney disease who presented to the emergency room with acute onset of hyperglycemia and altered mental status.  He has missed his insulin since Saturday night.  No nausea or vomiting or abdominal pain or diarrhea.  He has not been drinking much fluids per his wife.  He was noted to be shaking so bad when she called EMS.  She stated that he has been having dry cough from dry mouth and therefore is been having polydipsia with associated polyuria.  No dysuria or hematuria or urgency or flank pain.  No wheezing or dyspnea.  No rhinorrhea  or nasal congestion or sore throat.  No chest pain or palpitations.  Upon presentation emergency room, he was hypotensive with a blood pressure of 83/53 with a pulse of 69 respiratory of 16 and hypothermia with temperature of 93.3 with a pulse currently of 90% on room air.  Labs revealed blood glucose of 1223 and a sodium of 140 with anion gap of 30, BUN of 71 and creatinine of 2.54 up from 63 and 1.33 on 12/17/2017.  His CO2 was 11 compared to 30 then potassium was 5.3 compared to 4.1 then with a chloride of 99.  His BNP was 374.  Lactic acid level came back 1.7 procalcitonin 0.34 urinalysis was unremarkable and portable chest x-ray showed no acute cardiopulmonary disease.  Chest CT revealed mild cardiomegaly with coronary artery  calcification and aortic atherosclerosis with no acute thoracic abnormality.  Noncontrasted head CT scan revealed bilateral inferior frontal encephalomalacia, posttraumatic appearance with no acute findings otherwise.  The patient was given 3 L bolus of IV normal saline and systolic blood pressure was still 89/54.  He was also given 2 g of IV cefepime, IV vancomycin and Flagyl on an empiric basis and initially 100 mg of hydrocortisone for concern about sepsis before lactic acid and procalcitonin levels were back, given the patient's hypotension as well as hypothermia.  He was placed on [facility].  He will be admitted to an ICU bed for further evaluation and management   VITAL SIGNS:  Blood pressure 123/83, pulse 73, temperature 98.4 F (36.9 C), temperature source Oral, resp. rate 17, height 5\' 7"  (1.702 m), weight 60.3 kg, SpO2 95 %.  I/O:    Intake/Output Summary (Last 24 hours) at 03/24/2018 1024 Last data filed at 03/24/2018 0949 Gross per 24 hour  Intake 720 ml  Output 250 ml  Net 470 ml    PHYSICAL EXAMINATION:  GENERAL:  63 y.o.-year-old patient lying in the bed with no acute distress.  EYES: Pupils equal, round, reactive to light and accommodation. No scleral icterus. Extraocular muscles intact.  HEENT: Head atraumatic, normocephalic. Oropharynx and nasopharynx clear.  NECK:  Supple, no jugular venous distention. No thyroid enlargement, no tenderness.  LUNGS: Normal breath sounds bilaterally, no wheezing, rales,rhonchi or crepitation. No use of accessory muscles of respiration.  CARDIOVASCULAR: S1, S2 normal. No murmurs, rubs, or gallops.  ABDOMEN: Soft, non-tender, non-distended. Bowel sounds present. No organomegaly or mass.  EXTREMITIES: No pedal edema, cyanosis, or clubbing.  NEUROLOGIC: Cranial nerves II through XII are intact. Muscle strength 5/5 in all extremities. Sensation intact. Gait not checked.  PSYCHIATRIC: The patient is alert and oriented x 3.  SKIN: No obvious  rash, lesion, or ulcer.   DATA REVIEW:   CBC Recent Labs  Lab 03/23/18 0354  WBC 8.6  HGB 12.1*  HCT 36.9*  PLT 251    Chemistries  Recent Labs  Lab 03/21/18 0314  03/22/18 1121  03/23/18 1515  NA 140   < > 150*   < > 142  K 5.3*   < > 3.5   < > 3.8  CL 99   < > 113*   < > 105  CO2 11*   < > 29   < > 32  GLUCOSE 1,223*   < > 224*   < > 183*  BUN 71*   < > 41*   < > 27*  CREATININE 2.54*   < > 1.13   < > 0.86  CALCIUM 8.5*   < > 8.5*   < >  8.6*  MG  --    < > 2.4  --   --   AST 26  --   --   --   --   ALT 19  --   --   --   --   ALKPHOS 108  --   --   --   --   BILITOT 1.7*  --   --   --   --    < > = values in this interval not displayed.    Cardiac Enzymes No results for input(s): TROPONINI in the last 168 hours.  Microbiology Results  Results for orders placed or performed during the hospital encounter of 03/21/18  Blood Culture (routine x 2)     Status: None (Preliminary result)   Collection Time: 03/21/18  3:14 AM  Result Value Ref Range Status   Specimen Description BLOOD BLOOD LEFT FOREARM  Final   Special Requests   Final    BOTTLES DRAWN AEROBIC AND ANAEROBIC Blood Culture results may not be optimal due to an inadequate volume of blood received in culture bottles   Culture   Final    NO GROWTH 3 DAYS Performed at [facility], [address] [place], [place] [address]    Report Status PENDING  Incomplete  Urine culture     Status: None   Collection Time: 03/21/18  3:14 AM  Result Value Ref Range Status   Specimen Description   Final    URINE, RANDOM Performed at [facility], [address] [place], [place] [address]    Special Requests   Final    NONE Performed at [facility], [address] [place], [place] [address]    Culture   Final    NO GROWTH Performed at [facility], [place] [address], [place], [place] [address]    Report Status 03/22/2018 FINAL  Final  Blood Culture (routine x 2)     Status:  None (Preliminary result)   Collection Time: 03/21/18  3:34 AM  Result Value Ref Range Status   Specimen Description BLOOD BLOOD RIGHT HAND  Final   Special Requests   Final    BOTTLES DRAWN AEROBIC AND ANAEROBIC Blood Culture results may not be optimal due to an excessive volume of blood received in culture bottles   Culture   Final    NO GROWTH 3 DAYS Performed at [facility], [address] [place], [place] [address]    Report Status PENDING  Incomplete  MRSA PCR Screening     Status: None   Collection Time: 03/21/18  8:15 AM  Result Value Ref Range Status   MRSA by PCR NEGATIVE NEGATIVE Final    Comment:        The GeneXpert MRSA Assay (FDA approved for NASAL specimens only), is one component of a comprehensive MRSA colonization surveillance program. It is not intended to diagnose MRSA infection nor to guide or monitor treatment for MRSA infections. Performed at [facility], [address] [place], [place] [address]   Respiratory Panel by PCR     Status: None   Collection Time: 03/21/18 12:28 PM  Result Value Ref Range Status   Adenovirus NOT DETECTED NOT DETECTED Final   Coronavirus 229E NOT DETECTED NOT DETECTED Final    Comment: (NOTE) The Coronavirus on the Respiratory Panel, DOES NOT test for the novel  Coronavirus (2019 nCoV)    Coronavirus HKU1 NOT DETECTED NOT DETECTED Final  Coronavirus NL63 NOT DETECTED NOT DETECTED Final   Coronavirus OC43 NOT DETECTED NOT DETECTED Final   Metapneumovirus NOT DETECTED NOT DETECTED Final   Rhinovirus / Enterovirus NOT DETECTED NOT DETECTED Final   Influenza A NOT DETECTED NOT DETECTED Final   Influenza B NOT DETECTED NOT DETECTED Final   Parainfluenza Virus 1 NOT DETECTED NOT DETECTED Final   Parainfluenza Virus 2 NOT DETECTED NOT DETECTED Final   Parainfluenza Virus 3 NOT DETECTED NOT DETECTED Final   Parainfluenza Virus 4 NOT DETECTED NOT DETECTED Final   Respiratory Syncytial Virus NOT DETECTED  NOT DETECTED Final   Bordetella pertussis NOT DETECTED NOT DETECTED Final   Chlamydophila pneumoniae NOT DETECTED NOT DETECTED Final   Mycoplasma pneumoniae NOT DETECTED NOT DETECTED Final    Comment: Performed at [facility], [place] [address], [place],  [address]    RADIOLOGY:  No results found.  EKG:   Orders placed or performed during the hospital encounter of 03/21/18  . EKG 12-Lead  . EKG 12-Lead  . ED EKG 12-Lead  . ED EKG 12-Lead      Management plans discussed with the patient, family and they are in agreement.  CODE STATUS:     Code Status Orders  (From admission, onward)         Start     Ordered   03/21/18 0558  Full code  Continuous     03/21/18 0601        Code Status History    Date Active Date Inactive Code Status Order ID Comments User Context   12/15/2017 2358 12/17/2017 1933 Full Code [id number]  Lance Coon, MD Inpatient   04/27/2017 0531 04/29/2017 2118 Full Code [id number]  Harrie Foreman, MD Inpatient      TOTAL TIME TAKING CARE OF THIS PATIENT: 40 minutes.    Avel Peace Tira Lafferty M.D on 03/24/2018 at 10:24 AM  Between 7am to 6pm - Pager - [phone]  After 6pm go to [email or web address] - password [email or web address] [place] [facility]  Office  [phone]  CC: Primary care physician; [place]   Note: This dictation was prepared with Dragon dictation along with smaller phrase technology. Any transcriptional errors that result from this process are unintentional.

## 2018-03-24 NOTE — Care Management Important Message (Signed)
Important Message  Patient Details  Name: Shawn [name] MRN: [id number] Date of Birth: 28-May-1955   Medicare Important Message Given:  Yes; discussed per telephone conversation    Shelbie Ammons, RN 03/24/2018, 10:56 AM

## 2018-03-24 NOTE — Telephone Encounter (Signed)
Call to patient to clarify if pt is feeling well post hospital to see if e visit may be appropriate. LMOM.   I also routed questions to provider to get his advise on TCM.

## 2018-03-25 NOTE — Telephone Encounter (Signed)
evisit

## 2018-03-26 LAB — CULTURE, BLOOD (ROUTINE X 2)
Culture: NO GROWTH
Culture: NO GROWTH

## 2018-03-27 ENCOUNTER — Other Ambulatory Visit: Payer: Self-pay

## 2018-03-27 ENCOUNTER — Encounter: Payer: Self-pay | Admitting: Cardiovascular Disease

## 2018-03-27 ENCOUNTER — Telehealth (INDEPENDENT_AMBULATORY_CARE_PROVIDER_SITE_OTHER): Payer: Medicaid Other | Admitting: Cardiovascular Disease

## 2018-03-27 DIAGNOSIS — F17210 Nicotine dependence, cigarettes, uncomplicated: Secondary | ICD-10-CM

## 2018-03-27 DIAGNOSIS — Z794 Long term (current) use of insulin: Secondary | ICD-10-CM | POA: Diagnosis not present

## 2018-03-27 DIAGNOSIS — I5022 Chronic systolic (congestive) heart failure: Secondary | ICD-10-CM | POA: Diagnosis not present

## 2018-03-27 DIAGNOSIS — F17200 Nicotine dependence, unspecified, uncomplicated: Secondary | ICD-10-CM

## 2018-03-27 DIAGNOSIS — E1110 Type 2 diabetes mellitus with ketoacidosis without coma: Secondary | ICD-10-CM | POA: Diagnosis not present

## 2018-03-27 DIAGNOSIS — E118 Type 2 diabetes mellitus with unspecified complications: Secondary | ICD-10-CM

## 2018-03-27 DIAGNOSIS — F1910 Other psychoactive substance abuse, uncomplicated: Secondary | ICD-10-CM

## 2018-03-27 DIAGNOSIS — I428 Other cardiomyopathies: Secondary | ICD-10-CM

## 2018-03-27 DIAGNOSIS — F1010 Alcohol abuse, uncomplicated: Secondary | ICD-10-CM

## 2018-03-27 NOTE — Progress Notes (Signed)
Telephone Visit     Evaluation Performed:  Follow-up visit  This visit type was conducted due to national recommendations for restrictions regarding the COVID-19 Pandemic (e.g. social distancing).  This format is felt to be most appropriate for this patient at this time.  All issues noted in this document were discussed and addressed.  No physical exam was performed (except for noted visual exam findings with Telehealth visits).  See MyChart message from today for the patient's consent to telehealth for [facility]. (obtained verbally)  Date:  03/27/2018   ID:  Shawn [name], DOB 17-Dec-1955, MRN [id number]  Patient Location:  *[address] [place] [address]   Provider location:   [facility], office, [place]   PCP:  [facility] [place]  Cardiologist:  Ida Rogue, MD , [facility] Electrophysiologist:  None   Chief Complaint:  Hospital follow up for DKA, nonischemic cardiomyopathy  History of Present Illness:    Shawn [name] is a 63 y.o. male who presents via audio/video conferencing for a telehealth visit today.    past medical history of  nonischemic cardiomyopathy, ejection fraction less than 20% dating back to 2015 Prior catheterization June 2015, poorly controlled insulin-dependent diabetes,  prior strokes,  polysubstance abuse including alcohol,  smoking 1 pack/day who continues to smoke,  Recent hospitalization forDKA hemoglobin A1c greater than 15 Started on insulin infusion Who presents for follow-up of his nonischemic dilated cardiomyopathy, heart failure  The patient does not symptoms concerning for COVID-19 infection (fever, chills, cough, or new SHORTNESS OF BREATH).    In December 2019 he was admitted to [facility] in the setting of DKA.  He is followed by primary care at the [facility]   Chronic mild to moderate bilateral lower extremity swelling which typically worsens throughout the day and is improved in the morning.   Weight typically fluctuates between 138 and 145 at home based on prior clinic visit March 14, 2018 9 was previously eating out every day for lunch Currently on Lasix 40 twice daily Beta-blocker, Entresto, spironolactone, hydralazine,  Entresto increased on prior clinic visit up to 97/103 twice daily It was recommended he take extra Lasix 20 mg in the morning for weight gain to 3 pounds Echocardiogram recommended in 1 to 2 months Reported he remained off alcohol  Following that visit in our clinic he presented to the hospital with hypoglycemia and altered mental status March 21, 2018 Notes indicating he had not been taking his insulin, was more confused Hypertensive on arrival to the emergency room systolic pressure 83 Creatinine 2.54 BUN 71 He was hypothermic temperature 93 degrees, and with broad-spectrum antibiotics Blood pressure improved with IV fluids Renal failure improved  Creatinine 0.86 BUN 27 on March 23, 2018  At discharge restarted on Coreg 12.5 mg twice daily, Lasix 40 twice daily, hydralazine 100 twice daily Entresto 97 103 twice daily, spironolactone 25  Eating better Legs still weak, slowly getting better Weight 141 pounds, stable, back at baseline Wife giving him his medications, making sure he is compliant Still some dietary indiscretion Drinking wine not hard alcohol, chronic issue   Prior CV studies:   The following studies were reviewed today:  Echocardiogram April 2019 Left ventricle: The cavity size was normal. There was moderate   concentric hypertrophy. Systolic function was severely reduced.   The estimated ejection fraction was in the range of 20% to 25%.   Diffuse hypokinesis. Regional wall motion abnormalities cannot be   excluded. Doppler parameters are consistent  with abnormal left   ventricular relaxation (grade 1 diastolic dysfunction). - Left atrium: The atrium was normal in size. - Right ventricle: Systolic function was normal. - Pulmonary  arteries: Systolic pressure could not be accurately   estimated.   Past Medical History:  Diagnosis Date  . Chronic combined systolic (congestive) and diastolic (congestive) heart failure ([place])    a. TTE 6/15: EF < 20%, mildly dilated LV, DD, mildly dilated LA, mod dilated RA, mild MR, mild to mod TR, mod increased posterior wall thickness, elevated LA and LVEDP; b. 4/12019 Echo: EF 20-25%, diff HK. Gr1 DD, nl RV fxn.  . CKD (chronic kidney disease), stage II   . Diabetes mellitus with complication ([place])    a. Prior admissions w/ DKA (last 12/2017).  . Essential hypertension   . NICM (nonischemic cardiomyopathy) ([place])    a. 06/2013 Echo: EF<20%; b. 06/2013 Cath: no signif dzs; c. 04/2017 Echo: 20-25%, gr1 DD.  Marland Kitchen Polysubstance abuse ([place])    a. etoh and tobacco  . Stroke [facility])    Past Surgical History:  Procedure Laterality Date  . NO PAST SURGERIES       No outpatient medications have been marked as taking for the 03/27/18 encounter (Appointment) with Minna Merritts, MD.     Allergies:   Patient has no known allergies.   Social History   Tobacco Use  . Smoking status: Current Every Day Smoker    Packs/day: 0.50    Types: Cigarettes  . Smokeless tobacco: Never Used  Substance Use Topics  . Alcohol use: Not Currently    Frequency: Never  . Drug use: Not on file     Family Hx: The patient's family history includes Congestive Heart Failure in his brother and mother; Diabetes in his mother.  ROS:   Please see the history of present illness.    Review of Systems  Constitutional: Negative.   Respiratory: Negative.   Cardiovascular: Negative.   Gastrointestinal: Negative.   Musculoskeletal: Negative.        Legs weak  Neurological: Negative.   Psychiatric/Behavioral: Negative.   All other systems reviewed and are negative.   Labs/Other Tests and Data Reviewed:    Recent Labs: 04/29/2017: TSH 1.705 03/21/2018: ALT 19; B Natriuretic Peptide 374.0 03/22/2018: Magnesium  2.4 03/23/2018: BUN 27; Creatinine, Ser 0.86; Hemoglobin 12.1; Platelets 251; Potassium 3.8; Sodium 142   Recent Lipid Panel Lab Results  Component Value Date/Time   CHOL 152 06/18/2013 04:27 AM   TRIG 67 06/18/2013 04:27 AM   HDL 80 (H) 06/18/2013 04:27 AM   LDLCALC 59 06/18/2013 04:27 AM    Wt Readings from Last 3 Encounters:  03/21/18 132 lb 15 oz (60.3 kg)  03/14/18 142 lb 4 oz (64.5 kg)  12/17/17 131 lb (59.4 kg)     Exam:    Vital Signs:  There were no vitals taken for this visit.   Well nourished, well developed male in no acute distress. HEENT: No masses, nontender Lungs: No coughing, wheezing  Abdomen nontender, nondistended Lower extremities no significant lower extremity edema   ASSESSMENT & PLAN:    1.  1. Chronic systolic CHF (congestive heart failure) ([place]) He is back on his medications after recent hospital discharge, feels well with no complaints Recommend he monitor blood pressure heart rate at home She needs to get new batteries for the blood pressure cuff  2. Type 2 diabetes mellitus with complication, with long-term current use of insulin ([name]) Prior noncompliance with insulin Wife  is now tracking his medications more closely, does not know why he did not take his insulin previously leading to hospitalization Reports he is now stronger, doing better  3. Diabetic ketoacidosis without coma associated with type 2 diabetes mellitus ([place]) Recent hospitalization, Hospital records reviewed in detail discussed with patient and wife  4. NICM (nonischemic cardiomyopathy) ([place]) Prior history of alcohol abuse Wife reports he is not drinking hard alcohol but is drinking red wine Recommended alcohol cessation Continue current medications as detailed above  5. Polysubstance abuse ([place]) Recommended smoking cessation, and stopping alcohol  6. Smoker Smoking cessation recommende  7. Alcohol abuse Per the wife sounds like he is drinking red wine, cessation  recommended  COVID-19 Education: The signs and symptoms of COVID-19 were discussed with the patient and how to seek care for testing (follow up with PCP or arrange E-visit).  The importance of social distancing was discussed today.  Patient Risk:   After full review of this patients clinical status, I feel that they are at least moderate risk at this time.  Time:   Today, I have spent 25 minutes with the patient with telehealth technology discussing nonischemic cardiomyopathy, medication compliance strategies, diabetes diet, alcohol cessation, importance of taking diabetes medications, signs and symptoms of DKA.     Medication Adjustments/Labs and Tests Ordered: Current medicines are reviewed at length with the patient today.  Concerns regarding medicines are outlined above.  Tests Ordered: No orders of the defined types were placed in this encounter.  Medication Changes: No orders of the defined types were placed in this encounter.   Disposition:  in 4 week(s) telephone visit  Signed, Ida Rogue, MD  03/27/2018 12:44 PM    [facility] [place], [place], [place]  [address] Phone: [phone]; Fax: [phone]

## 2018-03-27 NOTE — Patient Instructions (Signed)
Medication Instructions:  No changes  If you need a refill on your cardiac medications before your next appointment, please call your pharmacy.    Lab work: No new labs needed   If you have labs (blood work) drawn today and your tests are completely normal, you will receive your results only by: Marland Kitchen MyChart Message (if you have MyChart) OR . A paper copy in the mail If you have any lab test that is abnormal or we need to change your treatment, we will call you to review the results.   Testing/Procedures: No new testing needed   Follow-Up: At [facility], you and your health needs are our priority.  As part of our continuing mission to provide you with exceptional heart care, we have created designated Provider Care Teams.  These Care Teams include your primary Cardiologist (physician) and Advanced Practice Providers (APPs -  Physician Assistants and Nurse Practitioners) who all work together to provide you with the care you need, when you need it.  . You will need a follow up appointment in 1 month evisit (telephone) .   Please call our office 2 months in advance to schedule this appointment.    . Providers on your designated Care Team:   . Murray Hodgkins, NP . Christell Faith, PA-C . Marrianne Mood, PA-C  Any Other Special Instructions Will Be Listed Below (If Applicable).  For educational health videos Log in to : [email or web address] Or : [email or web address], password : [email or web address]

## 2018-03-27 NOTE — Telephone Encounter (Signed)
Patient wife reluctant to do mychart but was sent a link to sign up.  Patient wife calling to check status of scheduling.

## 2018-03-28 NOTE — Telephone Encounter (Signed)
I spoke with the patient's wife, she advised that Dr. Rockey Situ did an "E-visit" with them yesterday.   Will close this encounter.

## 2018-03-29 ENCOUNTER — Telehealth: Payer: Self-pay | Admitting: Cardiovascular Disease

## 2018-03-29 ENCOUNTER — Other Ambulatory Visit: Payer: Self-pay | Admitting: *Deleted

## 2018-03-29 MED ORDER — CARVEDILOL 12.5 MG PO TABS: 12.5000 mg | ORAL_TABLET | Freq: Two times a day (BID) | ORAL | 0 refills | Status: DC

## 2018-03-29 NOTE — Telephone Encounter (Signed)
Requested Prescriptions   Signed Prescriptions Disp Refills  . carvedilol (COREG) 12.5 MG tablet 180 tablet 0    Sig: Take 1 tablet (12.5 mg total) by mouth 2 (two) times daily with a meal.    Authorizing Provider: Minna Merritts    Ordering User: Britt Bottom

## 2018-03-29 NOTE — Telephone Encounter (Signed)
°*  STAT* If patient is at the pharmacy, call can be transferred to refill team.   1. Which medications need to be refilled? (please list name of each medication and dose if known)  Carvedilol 12.5 MG 2 times daily  Patient states that she picked up losartan 25MG  medication yesterday but Dr Rockey Situ ended up discontinuing that medication.  States that pharmacy needs to be made aware that losartan was discontinued because they will not take it back.    2. Which pharmacy/location (including street and city if local pharmacy) is medication to be sent to? [facility]   3. Do they need a 30 day or 90 day supply? 90 day

## 2018-03-30 NOTE — Progress Notes (Signed)
Lm with spouse to call office for scheduling

## 2018-05-02 ENCOUNTER — Telehealth: Payer: Self-pay

## 2018-05-02 NOTE — Telephone Encounter (Signed)
Called patient.  Made sure consent was read a the time of scheduling his first Telehealth visit.  He confirmed that Consent was read to him. Documenting consent.      Virtual Visit Pre-Appointment Phone Call  "(Name), I am calling you today to discuss your upcoming appointment. We are currently trying to limit exposure to the virus that causes COVID-19 by seeing patients at home rather than in the office."  1. "What is the BEST phone number to call the day of the visit?" - include this in appointment notes  2. "Do you have or have access to (through a family member/friend) a smartphone with video capability that we can use for your visit?" a. If yes - list this number in appt notes as "cell" (if different from BEST phone #) and list the appointment type as a VIDEO visit in appointment notes b. If no - list the appointment type as a PHONE visit in appointment notes  3. Confirm consent - "In the setting of the current Covid19 crisis, you are scheduled for a (phone or video) visit with your provider on (date) at (time).  Just as we do with many in-office visits, in order for you to participate in this visit, we must obtain consent.  If you'd like, I can send this to your mychart (if signed up) or email for you to review.  Otherwise, I can obtain your verbal consent now.  All virtual visits are billed to your insurance company just like a normal visit would be.  By agreeing to a virtual visit, we'd like you to understand that the technology does not allow for your provider to perform an examination, and thus may limit your provider's ability to fully assess your condition. If your provider identifies any concerns that need to be evaluated in person, we will make arrangements to do so.  Finally, though the technology is pretty good, we cannot assure that it will always work on either your or our end, and in the setting of a video visit, we may have to convert it to a phone-only visit.  In either  situation, we cannot ensure that we have a secure connection.  Are you willing to proceed?" STAFF: Did the patient verbally acknowledge consent to telehealth visit? Document YES/NO here: YES  4. Advise patient to be prepared - "Two hours prior to your appointment, go ahead and check your blood pressure, pulse, oxygen saturation, and your weight (if you have the equipment to check those) and write them all down. When your visit starts, your provider will ask you for this information. If you have an Apple Watch or Kardia device, please plan to have heart rate information ready on the day of your appointment. Please have a pen and paper handy nearby the day of the visit as well."  5. Give patient instructions for MyChart download to smartphone OR Doximity/Doxy.me as below if video visit (depending on what platform provider is using)  6. Inform patient they will receive a phone call 15 minutes prior to their appointment time (may be from unknown caller ID) so they should be prepared to answer    TELEPHONE CALL NOTE  Shawn [name] has been deemed a candidate for a follow-up tele-health visit to limit community exposure during the Covid-19 pandemic. I spoke with the patient via phone to ensure availability of phone/video source, confirm preferred email & phone number, and discuss instructions and expectations.  I reminded Shawn [name] to be prepared with any vital  sign and/or heart rhythm information that could potentially be obtained via home monitoring, at the time of his visit. I reminded Shawn [name] to expect a phone call prior to his visit.  Janan Ridge, [place] 05/02/2018 11:47 AM   INSTRUCTIONS FOR DOWNLOADING THE MYCHART APP TO SMARTPHONE  - The patient must first make sure to have activated MyChart and know their login information - If Apple, go to [employer] and type in MyChart in the search bar and download the app. If Android, ask patient to go to [employer]  and type in [place] in the search bar and download the app. The app is free but as with any other app downloads, their phone may require them to verify saved payment information or Apple/Android password.  - The patient will need to then log into the app with their MyChart username and password, and select [facility] as their healthcare provider to link the account. When it is time for your visit, go to the MyChart app, find appointments, and click Begin Video Visit. Be sure to Select Allow for your device to access the Microphone and Camera for your visit. You will then be connected, and your provider will be with you shortly.  **If they have any issues connecting, or need assistance please contact MyChart service desk [phone])**  **If using a computer, in order to ensure the best quality for their visit they will need to use either of the following Internet Browsers: [employer], or Google Chrome**  IF USING DOXIMITY or DOXY.ME - The patient will receive a link just prior to their visit by text.     FULL LENGTH CONSENT FOR TELE-HEALTH VISIT   I hereby voluntarily request, consent and authorize [facility] and its employed or contracted physicians, physician assistants, nurse practitioners or other licensed health care professionals (the Practitioner), to provide me with telemedicine health care services (the "Services") as deemed necessary by the treating Practitioner. I acknowledge and consent to receive the Services by the Practitioner via telemedicine. I understand that the telemedicine visit will involve communicating with the Practitioner through live audiovisual communication technology and the disclosure of certain medical information by electronic transmission. I acknowledge that I have been given the opportunity to request an in-person assessment or other available alternative prior to the telemedicine visit and am voluntarily participating in the telemedicine  visit.  I understand that I have the right to withhold or withdraw my consent to the use of telemedicine in the course of my care at any time, without affecting my right to future care or treatment, and that the Practitioner or I may terminate the telemedicine visit at any time. I understand that I have the right to inspect all information obtained and/or recorded in the course of the telemedicine visit and may receive copies of available information for a reasonable fee.  I understand that some of the potential risks of receiving the Services via telemedicine include:  Marland Kitchen Delay or interruption in medical evaluation due to technological equipment failure or disruption; . Information transmitted may not be sufficient (e.g. poor resolution of images) to allow for appropriate medical decision making by the Practitioner; and/or  . In rare instances, security protocols could fail, causing a breach of personal health information.  Furthermore, I acknowledge that it is my responsibility to provide information about my medical history, conditions and care that is complete and accurate to the best of my ability. I acknowledge that Practitioner's advice, recommendations, and/or decision  may be based on factors not within their control, such as incomplete or inaccurate data provided by me or distortions of diagnostic images or specimens that may result from electronic transmissions. I understand that the practice of medicine is not an exact science and that Practitioner makes no warranties or guarantees regarding treatment outcomes. I acknowledge that I will receive a copy of this consent concurrently upon execution via email to the email address I last provided but may also request a printed copy by calling the office of [facility].    I understand that my insurance will be billed for this visit.   I have read or had this consent read to me. . I understand the contents of this consent, which adequately explains  the benefits and risks of the Services being provided via telemedicine.  . I have been provided ample opportunity to ask questions regarding this consent and the Services and have had my questions answered to my satisfaction. . I give my informed consent for the services to be provided through the use of telemedicine in my medical care  By participating in this telemedicine visit I agree to the above.

## 2018-05-02 NOTE — Progress Notes (Signed)
Virtual Visit via Video Note   This visit type was conducted due to national recommendations for restrictions regarding the COVID-19 Pandemic (e.g. social distancing) in an effort to limit this patient's exposure and mitigate transmission in our community.  Due to his co-morbid illnesses, this patient is at least at moderate risk for complications without adequate follow up.  This format is felt to be most appropriate for this patient at this time.  All issues noted in this document were discussed and addressed.  A limited physical exam was performed with this format.  Please refer to the patient's chart for his consent to telehealth for [facility].   I connected with  Shawn [name] on 05/03/18 by a video enabled telemedicine application and verified that I am speaking with the correct person using two identifiers. I discussed the limitations of evaluation and management by telemedicine. The patient expressed understanding and agreed to proceed.   Evaluation Performed:  Follow-up visit  Date:  05/03/2018   ID:  Shawn [name], DOB 11-Dec-1955, MRN [id number]  Patient Location:  [address] [place] [address]   Provider location:   [facility], [place] office  PCP:  [place]  Cardiologist:  Arvid Right [facility]   Chief Complaint: Leg swelling    History of Present Illness:    Shawn [name] is a 63 y.o. male who presents via audio/video conferencing for a telehealth visit today.   The patient does not symptoms concerning for COVID-19 infection (fever, chills, cough, or new SHORTNESS OF BREATH).   Patient has a past medical history of 63 year old gentleman with past medical history of  nonischemic cardiomyopathy, ejection fraction less than 20% dating back to 2015 Prior catheterization June 2015, poorly controlled insulin-dependent diabetes,  prior strokes,  polysubstance abuse including alcohol,  smoking 1 pack/day who  continues to smoke,  Recent hospitalization for DKA hemoglobin A1c greater than 15 Started on insulin infusion Who presents for follow-up of his nonischemic dilated cardiomyopathy  In follow-up today he reports that he continues to have chronic mild leg swelling around his feet Reports swelling has been better on lasix 80 in the morning /40 mg in the afternoon daily  Weight 151 pounds Was 141 pound last month Eats "like a hog" per family  Review of previous notes indicates Chronic mild to moderate bilateral lower extremity swelling which typically worsens throughout the day and is improved in the morning.  Sugars "good" This has been a major issue in the past  Currently with dietary indiscretion Drinking wine not hard alcohol, chronic issue  In December 2019 in the hospital  DKA. He is followed by primary care at the [facility]    hospital with hyperglycemia and altered mental status March 21, 2018 Notes indicating he had not been taking his insulin, was more confused Hypertensive on arrival to the emergency room systolic pressure 83 Creatinine 2.54 BUN 71 He was hypothermic temperature 93 degrees, and with broad-spectrum antibiotics Blood pressure improved with IV fluids Renal failure improved   Prior CV studies:   The following studies were reviewed today: Echo 04/2017 - Left ventricle: The cavity size was normal. There was moderate   concentric hypertrophy. Systolic function was severely reduced.   The estimated ejection fraction was in the range of 20% to 25%.   Diffuse hypokinesis. Regional wall motion abnormalities cannot be   excluded. Doppler parameters are consistent with abnormal left   ventricular relaxation (grade 1 diastolic dysfunction). -  Left atrium: The atrium was normal in size. - Right ventricle: Systolic function was normal. - Pulmonary arteries: Systolic pressure could not be accurately   estimated.  He had CT scan Head showing chronic infarcts   Echocardiogram  severely depressed ejection fraction, 20%, global hypokinesis Unable to estimate right heart pressures  no significant change in echo compared to study June 2015   Past Medical History:  Diagnosis Date  . Chronic combined systolic (congestive) and diastolic (congestive) heart failure ([place])    a. TTE 6/15: EF < 20%, mildly dilated LV, DD, mildly dilated LA, mod dilated RA, mild MR, mild to mod TR, mod increased posterior wall thickness, elevated LA and LVEDP; b. 4/12019 Echo: EF 20-25%, diff HK. Gr1 DD, nl RV fxn.  . CKD (chronic kidney disease), stage II   . Diabetes mellitus with complication ([place])    a. Prior admissions w/ DKA (last 12/2017).  . Essential hypertension   . NICM (nonischemic cardiomyopathy) ([place])    a. 06/2013 Echo: EF<20%; b. 06/2013 Cath: no signif dzs; c. 04/2017 Echo: 20-25%, gr1 DD.  Marland Kitchen Polysubstance abuse ([place])    a. etoh and tobacco  . Stroke [facility])    Past Surgical History:  Procedure Laterality Date  . NO PAST SURGERIES       Current Meds  Medication Sig  . aspirin 81 MG chewable tablet Chew 1 tablet (81 mg total) by mouth daily.  Marland Kitchen atorvastatin (LIPITOR) 40 MG tablet Take 1 tablet (40 mg total) by mouth at bedtime.  . baclofen (LIORESAL) 10 MG tablet Take 10 mg by mouth 2 (two) times daily as needed for muscle spasms. Muscle spasm  . carvedilol (COREG) 12.5 MG tablet Take 1 tablet (12.5 mg total) by mouth 2 (two) times daily with a meal.  . cetirizine (ZYRTEC) 10 MG tablet TAKE 1 TABLET BY MOUTH DAILY  . furosemide (LASIX) 40 MG tablet Take 40 mg by mouth 2 (two) times daily.  . hydrALAZINE (APRESOLINE) 100 MG tablet Take 100 mg by mouth 2 (two) times daily.  . insulin aspart (NOVOLOG) 100 UNIT/ML injection Inject 5 Units into the skin 3 (three) times daily with meals.  . insulin glargine (LANTUS) 100 UNIT/ML injection Inject 15 Units into the skin at bedtime.  . Multiple Vitamin (MULTIVITAMIN WITH MINERALS) TABS tablet Take 1 tablet by mouth  daily.  . nicotine (NICODERM CQ - DOSED IN MG/24 HOURS) 14 mg/24hr patch Place 1 patch (14 mg total) onto the skin daily.  . sacubitril-valsartan (ENTRESTO) 97-103 MG Take 1 tablet by mouth 2 (two) times daily.  Marland Kitchen spironolactone (ALDACTONE) 25 MG tablet Take 25 mg by mouth at bedtime.  Marland Kitchen venlafaxine (EFFEXOR) 37.5 MG tablet Take 37.5 mg by mouth daily.     Allergies:   Patient has no known allergies.   Social History   Tobacco Use  . Smoking status: Current Every Day Smoker    Packs/day: 0.50    Types: Cigarettes  . Smokeless tobacco: Never Used  Substance Use Topics  . Alcohol use: Not Currently    Frequency: Never  . Drug use: Not on file     Current Outpatient Medications on File Prior to Visit  Medication Sig Dispense Refill  . aspirin 81 MG chewable tablet Chew 1 tablet (81 mg total) by mouth daily. 30 tablet 1  . atorvastatin (LIPITOR) 40 MG tablet Take 1 tablet (40 mg total) by mouth at bedtime. 30 tablet 0  . baclofen (LIORESAL) 10 MG tablet Take 10 mg  by mouth 2 (two) times daily as needed for muscle spasms. Muscle spasm    . carvedilol (COREG) 12.5 MG tablet Take 1 tablet (12.5 mg total) by mouth 2 (two) times daily with a meal. 180 tablet 0  . cetirizine (ZYRTEC) 10 MG tablet TAKE 1 TABLET BY MOUTH DAILY 90 tablet 1  . furosemide (LASIX) 40 MG tablet Take 40 mg by mouth 2 (two) times daily.    . hydrALAZINE (APRESOLINE) 100 MG tablet Take 100 mg by mouth 2 (two) times daily.    . insulin aspart (NOVOLOG) 100 UNIT/ML injection Inject 5 Units into the skin 3 (three) times daily with meals.    . insulin glargine (LANTUS) 100 UNIT/ML injection Inject 15 Units into the skin at bedtime.    . Multiple Vitamin (MULTIVITAMIN WITH MINERALS) TABS tablet Take 1 tablet by mouth daily. 30 tablet 1  . nicotine (NICODERM CQ - DOSED IN MG/24 HOURS) 14 mg/24hr patch Place 1 patch (14 mg total) onto the skin daily. 28 patch 0  . sacubitril-valsartan (ENTRESTO) 97-103 MG Take 1 tablet by  mouth 2 (two) times daily. 60 tablet 11  . spironolactone (ALDACTONE) 25 MG tablet Take 25 mg by mouth at bedtime.    Marland Kitchen venlafaxine (EFFEXOR) 37.5 MG tablet Take 37.5 mg by mouth daily.     No current facility-administered medications on file prior to visit.      Family Hx: The patient's family history includes Congestive Heart Failure in his brother and mother; Diabetes in his mother.  ROS:   Please see the history of present illness.    Review of Systems  Constitutional: Negative.   Respiratory: Negative.   Cardiovascular: Positive for leg swelling.  Gastrointestinal: Negative.   Musculoskeletal: Negative.   Neurological: Negative.   Psychiatric/Behavioral: Negative.   All other systems reviewed and are negative.     Labs/Other Tests and Data Reviewed:    Recent Labs: 03/21/2018: ALT 19; B Natriuretic Peptide 374.0 03/22/2018: Magnesium 2.4 03/23/2018: BUN 27; Creatinine, Ser 0.86; Hemoglobin 12.1; Platelets 251; Potassium 3.8; Sodium 142   Recent Lipid Panel Lab Results  Component Value Date/Time   CHOL 152 06/18/2013 04:27 AM   TRIG 67 06/18/2013 04:27 AM   HDL 80 (H) 06/18/2013 04:27 AM   LDLCALC 59 06/18/2013 04:27 AM    Wt Readings from Last 3 Encounters:  03/21/18 132 lb 15 oz (60.3 kg)  03/14/18 142 lb 4 oz (64.5 kg)  12/17/17 131 lb (59.4 kg)     Exam:    Vital Signs: Vital signs may also be detailed in the HPI There were no vitals taken for this visit.  Wt Readings from Last 3 Encounters:  03/21/18 132 lb 15 oz (60.3 kg)  03/14/18 142 lb 4 oz (64.5 kg)  12/17/17 131 lb (59.4 kg)   Temp Readings from Last 3 Encounters:  03/24/18 98.4 F (36.9 C) (Oral)  12/17/17 (!) 97.5 F (36.4 C) (Oral)  12/03/17 97.9 F (36.6 C) (Oral)   BP Readings from Last 3 Encounters:  03/24/18 123/83  03/14/18 124/70  12/17/17 110/83   Pulse Readings from Last 3 Encounters:  03/24/18 73  03/14/18 76  12/17/17 82    120/80/70, pulse 70 resp 16  Well  nourished, well developed male in no acute distress. Constitutional:  oriented to person, place, and time. No distress.    ASSESSMENT & PLAN:    Chronic systolic CHF (congestive heart failure) ([name]) Continue same meds Echo 1 -2 months to help gauge  further medication management  Type 2 diabetes mellitus with complication, with long-term current use of insulin ([name]) Doing better,  Previously poorly controlled, wife reports he is doing better Sugar this morning 150  NICM (nonischemic cardiomyopathy) ([place]) Prior history of heavy alcohol intake Recommend he stop drinking wine  Polysubstance abuse ([place]) Stressed cessation of smoking, alcohol, other drugs of abuse  Alcohol abuse Still drinking wine on a regular basis Discussed with patient and wife that they need to stop wine  Essential hypertension Blood pressure is well controlled on today's visit. No changes made to the medications.  Tobacco abuse We have encouraged him to continue to work on weaning his cigarettes and smoking cessation. He will continue to work on this and does not want any assistance with chantix.   Leg swelling Chronic issue, recommended compression hose   COVID-19 Education: The signs and symptoms of COVID-19 were discussed with the patient and how to seek care for testing (follow up with PCP or arrange E-visit).  The importance of social distancing was discussed today.  Patient Risk:   After full review of this patients clinical status, I feel that they are at least moderate risk at this time.  Time:   Today, I have spent 25 minutes with the patient with telehealth technology discussing the cardiac and medical problems/diagnoses detailed above   10 min spent reviewing the chart prior to patient visit today   Medication Adjustments/Labs and Tests Ordered: Current medicines are reviewed at length with the patient today.  Concerns regarding medicines are outlined above.   Tests Ordered: No tests  ordered   Medication Changes: No changes made   Disposition: Follow-up in 6 months   Signed, Ida Rogue, MD  05/03/2018 11:55 AM    [place] Office [address] [place] [address], [place], [place] [address]

## 2018-05-03 ENCOUNTER — Telehealth (INDEPENDENT_AMBULATORY_CARE_PROVIDER_SITE_OTHER): Payer: Medicaid Other | Admitting: Cardiovascular Disease

## 2018-05-03 ENCOUNTER — Other Ambulatory Visit: Payer: Self-pay

## 2018-05-03 ENCOUNTER — Telehealth: Payer: Medicaid Other | Admitting: Cardiovascular Disease

## 2018-05-03 DIAGNOSIS — F1010 Alcohol abuse, uncomplicated: Secondary | ICD-10-CM

## 2018-05-03 DIAGNOSIS — I5022 Chronic systolic (congestive) heart failure: Secondary | ICD-10-CM

## 2018-05-03 DIAGNOSIS — E1110 Type 2 diabetes mellitus with ketoacidosis without coma: Secondary | ICD-10-CM | POA: Diagnosis not present

## 2018-05-03 DIAGNOSIS — I10 Essential (primary) hypertension: Secondary | ICD-10-CM

## 2018-05-03 DIAGNOSIS — Z794 Long term (current) use of insulin: Secondary | ICD-10-CM

## 2018-05-03 DIAGNOSIS — F1910 Other psychoactive substance abuse, uncomplicated: Secondary | ICD-10-CM

## 2018-05-03 DIAGNOSIS — E118 Type 2 diabetes mellitus with unspecified complications: Secondary | ICD-10-CM

## 2018-05-03 DIAGNOSIS — I428 Other cardiomyopathies: Secondary | ICD-10-CM

## 2018-05-03 DIAGNOSIS — Z720 Tobacco use: Secondary | ICD-10-CM

## 2018-05-03 DIAGNOSIS — F17200 Nicotine dependence, unspecified, uncomplicated: Secondary | ICD-10-CM

## 2018-05-03 MED ORDER — FUROSEMIDE 40 MG PO TABS: ORAL_TABLET | ORAL | 3 refills | Status: DC

## 2018-05-03 NOTE — Patient Instructions (Addendum)
Medication Instructions:  Your physician has recommended you make the following change in your medication:  1. START Furosemide 40 mg take 2 tablets (80 mg) in the morning and take 1 tablet (40 mg) in the PM  If you need a refill on your cardiac medications before your next appointment, please call your pharmacy.    Lab work: No new labs needed   If you have labs (blood work) drawn today and your tests are completely normal, you will receive your results only by: Marland Kitchen MyChart Message (if you have MyChart) OR . A paper copy in the mail If you have any lab test that is abnormal or we need to change your treatment, we will call you to review the results.   Testing/Procedures: No new testing needed   Follow-Up: At [facility], you and your health needs are our priority.  As part of our continuing mission to provide you with exceptional heart care, we have created designated Provider Care Teams.  These Care Teams include your primary Cardiologist (physician) and Advanced Practice Providers (APPs -  Physician Assistants and Nurse Practitioners) who all work together to provide you with the care you need, when you need it.  . You will need a follow up appointment in 6 months .   Please call our office 2 months in advance to schedule this appointment.    . Providers on your designated Care Team:   . Murray Hodgkins, NP . Christell Faith, PA-C . Marrianne Mood, PA-C  Any Other Special Instructions Will Be Listed Below (If Applicable).  For educational health videos Log in to : [email or web address] Or : [email or web address], password : [email or web address]

## 2018-05-09 ENCOUNTER — Telehealth: Payer: Self-pay | Admitting: Cardiovascular Disease

## 2018-05-09 NOTE — Telephone Encounter (Signed)
Needs Friday tele visit to go over echo results

## 2018-05-09 NOTE — Telephone Encounter (Signed)
Pt c/o medication issue:  1. Name of Medication: furosemide    2. How are you currently taking this medication (dosage and times per day)? 80 MG in morning 40 MG at night   3. Are you having a reaction (difficulty breathing--STAT)? swelling  4. What is your medication issue? Patients wife calling. States that the home health nurse would like to know if patient can be switched and try a different diuretic.  Patient is still experiencing lots of swelling.  Patient's BP was 184/98.  Please call to discuss.

## 2018-05-09 NOTE — Telephone Encounter (Signed)
Called and spoke to wife. She asked that I call her back in 15 min because she is not currently at her desk and would like to give me the name of a fluid pill that was suggested for him to take for swelling "since he is immune to the furosemide".   Agreed to call patient back.   Call back to wife. She reports that home health RN Jonelle Sidle was there this am and BP was elevated 184/98. Pt has continuing issue with LE swelling. Denies SOB or chest pain. Current weight 151 lbs.   He is taking Lasix 80 am/40 PM. Swelling is better in am but quickly worsens throughout the day. Wife was concerned about pain with swelling last night and gave him 1/2 pill (150 mg total ) allopurinol last night. She is convinced that it helped and would like his medication changed because "he is immune to the lasix". Per pt, he is having some UOP but "not as much as he normally does."  Made call to Jonelle Sidle, RN with home health.   She suggested change in fluid pill.  I spoke with her about compliance with heart healthy diet. She felt this was an issue and will continue to work on it. RN will go for additional visit this week on Thursday to check progress.  In reviewing Epic, pt is past due for echo with hx of 20-25% EF. I made appt for this Thursday at 3 pm. We reviewed clinic procedure. Verbal screening done. No risk factors identified.   Last BMP 3/19 Cr 0.86, 3/17 Cr 2.54 (AKI/DKA/PNA Sepsis)  Routed to provider to further advise.

## 2018-05-10 NOTE — Telephone Encounter (Signed)
Patient scheduled for 05/12/2018 @ 10:20

## 2018-05-11 ENCOUNTER — Other Ambulatory Visit: Payer: Self-pay

## 2018-05-11 ENCOUNTER — Ambulatory Visit (INDEPENDENT_AMBULATORY_CARE_PROVIDER_SITE_OTHER): Payer: Medicaid Other

## 2018-05-11 DIAGNOSIS — I5042 Chronic combined systolic (congestive) and diastolic (congestive) heart failure: Secondary | ICD-10-CM

## 2018-05-12 ENCOUNTER — Encounter: Payer: Self-pay | Admitting: Cardiovascular Disease

## 2018-05-12 ENCOUNTER — Telehealth (INDEPENDENT_AMBULATORY_CARE_PROVIDER_SITE_OTHER): Payer: Medicaid Other | Admitting: Cardiovascular Disease

## 2018-05-12 VITALS — Ht 71.0 in | Wt 156.0 lb

## 2018-05-12 DIAGNOSIS — I428 Other cardiomyopathies: Secondary | ICD-10-CM

## 2018-05-12 DIAGNOSIS — Z794 Long term (current) use of insulin: Secondary | ICD-10-CM

## 2018-05-12 DIAGNOSIS — I10 Essential (primary) hypertension: Secondary | ICD-10-CM

## 2018-05-12 DIAGNOSIS — I5022 Chronic systolic (congestive) heart failure: Secondary | ICD-10-CM | POA: Diagnosis not present

## 2018-05-12 DIAGNOSIS — F17200 Nicotine dependence, unspecified, uncomplicated: Secondary | ICD-10-CM

## 2018-05-12 DIAGNOSIS — E118 Type 2 diabetes mellitus with unspecified complications: Secondary | ICD-10-CM

## 2018-05-12 DIAGNOSIS — E1110 Type 2 diabetes mellitus with ketoacidosis without coma: Secondary | ICD-10-CM

## 2018-05-12 DIAGNOSIS — F1910 Other psychoactive substance abuse, uncomplicated: Secondary | ICD-10-CM

## 2018-05-12 MED ORDER — POTASSIUM CHLORIDE ER 10 MEQ PO TBCR: EXTENDED_RELEASE_TABLET | ORAL | 3 refills | Status: DC

## 2018-05-12 MED ORDER — METOLAZONE 5 MG PO TABS: ORAL_TABLET | ORAL | 3 refills | Status: DC

## 2018-05-12 NOTE — Patient Instructions (Addendum)
Medication Instructions:  Please take metolazone 5 mg x 1 dose 30 min before morning lasix for weight 150 or higher Take with potassium 2 tablets (20 meq total) x 1 dose   Stay on lasix 80 in the Am and 40 in the afternoon Add potassium 10 meq- take 1 tablet (10 meq) a day  If you need a refill on your cardiac medications before your next appointment, please call your pharmacy.    Lab work: No new labs needed   If you have labs (blood work) drawn today and your tests are completely normal, you will receive your results only by: Marland Kitchen MyChart Message (if you have MyChart) OR . A paper copy in the mail If you have any lab test that is abnormal or we need to change your treatment, we will call you to review the results.   Testing/Procedures: No new testing needed   Follow-Up: At [facility], you and your health needs are our priority.  As part of our continuing mission to provide you with exceptional heart care, we have created designated Provider Care Teams.  These Care Teams include your primary Cardiologist (physician) and Advanced Practice Providers (APPs -  Physician Assistants and Nurse Practitioners) who all work together to provide you with the care you need, when you need it.  . You will need a follow up appointment in 1 month  . Providers on your designated Care Team:   . Murray Hodgkins, NP . Christell Faith, PA-C . Marrianne Mood, PA-C  Any Other Special Instructions Will Be Listed Below (If Applicable).  For educational health videos Log in to : [email or web address] Or : [email or web address], password : [email or web address]

## 2018-05-12 NOTE — Progress Notes (Signed)
Telephone Visit     Evaluation Performed:  Follow-up visit  This visit type was conducted due to national recommendations for restrictions regarding the COVID-19 Pandemic (e.g. social distancing).  This format is felt to be most appropriate for this patient at this time.  All issues noted in this document were discussed and addressed.  No physical exam was performed (except for noted visual exam findings with Telehealth visits).  See MyChart message from today for the patient's consent to telehealth for [facility] (obtained verbally)  Date:  05/12/2018   ID:  Shawn [name], DOB 1955/02/06, MRN [id number]  Patient Location:  *[address] [place] [address]   Provider location:   [facility], office, [place]   PCP:  Center, [place]  Cardiologist:  Shawn Rogue, MD , [facility] Electrophysiologist:  None   Chief Complaint:  Hospital follow up for DKA, nonischemic cardiomyopathy  History of Present Illness:    Shawn [name] is a 63 y.o. male who presents via audio/video conferencing for a telehealth visit today.    past medical history of  nonischemic cardiomyopathy, ejection fraction less than 20% dating back to 2015 Prior catheterization June 2015, poorly controlled insulin-dependent diabetes,  prior strokes,  polysubstance abuse including alcohol,  smoking 1 pack/day who continues to smoke,  Recent hospitalization forDKA hemoglobin A1c greater than 15 Started on insulin infusion Chronic mild to moderate bilateral lower extremity swelling which typically worsens throughout the day and is improved in the morning. Who presents for follow-up of his nonischemic dilated cardiomyopathy, heart failure  The patient does not symptoms concerning for COVID-19 infection (fever, chills, cough, or new SHORTNESS OF BREATH).   Last telemedicine visit March 27, 2018 At that time was eating better Legs weak, slowly getting better Weight 141 pounds,  close to his baseline Wife giving him his medications, making sure he is compliant dietary indiscretion Drinking wine not hard alcohol, chronic issue  We did receive a phone call from visiting nurse blood pressure 180/98 Chronic leg swelling Weight was up to 151 pounds He was taking Lasix 80 in the morning 40 in the evening Swelling better in the morning worse in the evening Nurse reported continued poor diet  Last lab work reviewed creatinine 0.86 BUN 27 On discussion with patient and patient's wife,  "eats like a hog" "pees a lot", feels up 2-3 urine jugs a day Wife reports that last week he cut back on his fluids at her urging Prior to that was drinking too much fluids, lots of soda He shows no restraint Wearing compression hose, she does not feel that it is harder to get the compression hose on the normal  Echocardiogram performed yesterday reviewed personally by myself, images pulled up in the office,  showing persistent ejection fraction 25%, global hypokinesis Unable to accurately estimate right ventricular systolic pressure  Other past medical history reviewed  In December 2019 he was admitted to [facility] regional in the setting of DKA.  He is followed by primary care at the [facility]   Weight typically fluctuates between 138 and 145 at home based on prior clinic visit March 14, 2018 previously eating out every day for lunch  Following that visit in our clinic he presented to the hospital with hypoglycemia and altered mental status March 21, 2018 Notes indicating he had not been taking his insulin, was more confused Hypertensive on arrival to the emergency room systolic pressure 83 Creatinine 2.54 BUN 71 He was hypothermic temperature 93 degrees,  and with broad-spectrum antibiotics Blood pressure improved with IV fluids Renal failure improved   Prior CV studies:   The following studies were reviewed today:  Echocardiogram April 2019 Left ventricle: The cavity size was  normal. There was moderate   concentric hypertrophy. Systolic function was severely reduced.   The estimated ejection fraction was in the range of 20% to 25%.   Diffuse hypokinesis. Regional wall motion abnormalities cannot be   excluded. Doppler parameters are consistent with abnormal left   ventricular relaxation (grade 1 diastolic dysfunction). - Left atrium: The atrium was normal in size. - Right ventricle: Systolic function was normal. - Pulmonary arteries: Systolic pressure could not be accurately   estimated.   Past Medical History:  Diagnosis Date  . Chronic combined systolic (congestive) and diastolic (congestive) heart failure ([place])    a. TTE 6/15: EF < 20%, mildly dilated LV, DD, mildly dilated LA, mod dilated RA, mild MR, mild to mod TR, mod increased posterior wall thickness, elevated LA and LVEDP; b. 4/12019 Echo: EF 20-25%, diff HK. Gr1 DD, nl RV fxn.  . CKD (chronic kidney disease), stage II   . Diabetes mellitus with complication ([place])    a. Prior admissions w/ DKA (last 12/2017).  . Essential hypertension   . NICM (nonischemic cardiomyopathy) ([place])    a. 06/2013 Echo: EF<20%; b. 06/2013 Cath: no signif dzs; c. 04/2017 Echo: 20-25%, gr1 DD.  Marland Kitchen Polysubstance abuse ([place])    a. etoh and tobacco  . Stroke [facility])    Past Surgical History:  Procedure Laterality Date  . NO PAST SURGERIES       Current Meds  Medication Sig  . aspirin 81 MG chewable tablet Chew 1 tablet (81 mg total) by mouth daily.  Marland Kitchen atorvastatin (LIPITOR) 40 MG tablet Take 1 tablet (40 mg total) by mouth at bedtime.  . baclofen (LIORESAL) 10 MG tablet Take 10 mg by mouth 2 (two) times daily as needed for muscle spasms. Muscle spasm  . carvedilol (COREG) 12.5 MG tablet Take 1 tablet (12.5 mg total) by mouth 2 (two) times daily with a meal.  . cetirizine (ZYRTEC) 10 MG tablet TAKE 1 TABLET BY MOUTH DAILY  . furosemide (LASIX) 40 MG tablet Take 2 tablets (80 mg total) by mouth every morning AND 1 tablet (40  mg total) every evening.  . hydrALAZINE (APRESOLINE) 100 MG tablet Take 100 mg by mouth 2 (two) times daily.  . insulin aspart (NOVOLOG) 100 UNIT/ML injection Inject 5 Units into the skin 3 (three) times daily with meals.  . insulin glargine (LANTUS) 100 UNIT/ML injection Inject 15 Units into the skin at bedtime.  . Multiple Vitamin (MULTIVITAMIN WITH MINERALS) TABS tablet Take 1 tablet by mouth daily.  . nicotine (NICODERM CQ - DOSED IN MG/24 HOURS) 14 mg/24hr patch Place 1 patch (14 mg total) onto the skin daily.  . sacubitril-valsartan (ENTRESTO) 97-103 MG Take 1 tablet by mouth 2 (two) times daily.  Marland Kitchen spironolactone (ALDACTONE) 25 MG tablet Take 25 mg by mouth at bedtime.  Marland Kitchen venlafaxine (EFFEXOR) 37.5 MG tablet Take 37.5 mg by mouth daily.     Allergies:   Patient has no known allergies.   Social History   Tobacco Use  . Smoking status: Current Every Day Smoker    Packs/day: 0.50    Types: Cigarettes  . Smokeless tobacco: Never Used  Substance Use Topics  . Alcohol use: Not Currently    Frequency: Never  . Drug use: Not on file  Family Hx: The patient's family history includes Congestive Heart Failure in his brother and mother; Diabetes in his mother.  ROS:   Please see the history of present illness.    Review of Systems  Constitutional: Negative.   Respiratory: Negative.   Cardiovascular: Negative.   Gastrointestinal: Negative.   Musculoskeletal: Negative.        Legs weak  Neurological: Negative.   Psychiatric/Behavioral: Negative.   All other systems reviewed and are negative.   Labs/Other Tests and Data Reviewed:    Recent Labs: 03/21/2018: ALT 19; B Natriuretic Peptide 374.0 03/22/2018: Magnesium 2.4 03/23/2018: BUN 27; Creatinine, Ser 0.86; Hemoglobin 12.1; Platelets 251; Potassium 3.8; Sodium 142   Recent Lipid Panel Lab Results  Component Value Date/Time   CHOL 152 06/18/2013 04:27 AM   TRIG 67 06/18/2013 04:27 AM   HDL 80 (H) 06/18/2013 04:27 AM    LDLCALC 59 06/18/2013 04:27 AM    Wt Readings from Last 3 Encounters:  05/12/18 156 lb (70.8 kg)  03/21/18 132 lb 15 oz (60.3 kg)  03/14/18 142 lb 4 oz (64.5 kg)     Exam:    Vital Signs:  Ht 5\' 11"  (1.803 m)   Wt 156 lb (70.8 kg)   BMI 21.76 kg/m    Well nourished, well developed male in no acute distress. HEENT: No masses, nontender Lungs: No coughing, wheezing  Abdomen nontender, nondistended Lower extremities no significant lower extremity edema   ASSESSMENT & PLAN:    1.  Chronic systolic CHF (congestive heart failure) ([name]) Poor diet, high fluid intake, continues to drink wine Wife trying to get him to comply Baseline weight likely 145 or less currently 151 We have recommended he take metolazone 5 mg prior to Lasix with 20 of potassium for weight 150 or higher -At baseline will stay on Lasix 80 in the morning 40 in the afternoon We will add potassium 10 daily  2. Type 2 diabetes mellitus with complication, with long-term current use of insulin ([name]) Prior noncompliance with insulin Wife with difficulty adjusting his diet, as he is so noncompliant If he misses a meal tends to run low but drinks a lot of soda and junk food  3. Diabetic ketoacidosis without coma associated with type 2 diabetes mellitus ([place]) Previous hospitalization,  She is trying to work on his diet, he is very noncompliant  4. NICM (nonischemic cardiomyopathy) ([place]) Prior history of alcohol abuse  drinking red wine Recommended alcohol cessation Likely contributing to his dilated cardiomyopathy Medication changes as above  5. Polysubstance abuse ([place]) Recommended smoking cessation, and stopping alcohol  6. Smoker Smoking cessation recommended He does not want Chantix  7. Alcohol abuse drinking red wine, cessation recommended We will encourage nurses to work with him  COVID-19 Education: The signs and symptoms of COVID-19 were discussed with the patient and how to seek care for testing  (follow up with PCP or arrange E-visit).  The importance of social distancing was discussed today.  Patient Risk:   After full review of this patients clinical status, I feel that they are at least moderate risk at this time.  Time:   Today, I have spent 25 minutes with the patient with telehealth technology discussing nonischemic cardiomyopathy, medication compliance strategies, diabetes diet, alcohol cessation, importance of taking diabetes medications, signs and symptoms of DKA.     Medication Adjustments/Labs and Tests Ordered: Current medicines are reviewed at length with the patient today.  Concerns regarding medicines are outlined above.   Tests Ordered:  No orders of the defined types were placed in this encounter.  Medication Changes: No orders of the defined types were placed in this encounter.   Disposition:  in 4 week(s) telephone visit  Signed, Shawn Rogue, MD  05/12/2018 10:21 AM    [facility] [place], [name], [place]  [address] Phone: [phone]; Fax: [phone]

## 2018-05-15 ENCOUNTER — Other Ambulatory Visit: Payer: Self-pay | Admitting: Cardiovascular Disease

## 2018-06-14 NOTE — Progress Notes (Deleted)
Telephone Visit     Evaluation Performed:  Follow-up visit  This visit type was conducted due to national recommendations for restrictions regarding the COVID-19 Pandemic (e.g. social distancing).  This format is felt to be most appropriate for this patient at this time.  All issues noted in this document were discussed and addressed.  No physical exam was performed (except for noted visual exam findings with Telehealth visits).  See MyChart message from today for the patient's consent to telehealth for [facility] (obtained verbally)  Date:  06/14/2018   ID:  Shawn [name], DOB [birth year], MRN [id number]  Patient Location:  *[address] [place] [address]   Provider location:   [facility], office, [place]    PCP:  Center, [place]  Cardiologist:  Ida Rogue, MD , [facility] Electrophysiologist:  None   Chief Complaint:  Hospital follow up for DKA, nonischemic cardiomyopathy  History of Present Illness:    Shawn [name] is a 63 y.o. male who presents via audio/video conferencing for a telehealth visit today.    past medical history of  nonischemic cardiomyopathy, ejection fraction less than 20% dating back to 2015 Prior catheterization June 2015, poorly controlled insulin-dependent diabetes,  prior strokes,  polysubstance abuse including alcohol,  smoking 1 pack/day who continues to smoke,  Recent hospitalization forDKA hemoglobin A1c greater than 15 Started on insulin infusion Chronic mild to moderate bilateral lower extremity swelling which typically worsens throughout the day and is improved in the morning. Who presents for follow-up of his nonischemic dilated cardiomyopathy, heart failure  The patient does not symptoms concerning for COVID-19 infection (fever, chills, cough, or new SHORTNESS OF BREATH).   Last telemedicine visit March 27, 2018 At that time was eating better Legs weak, slowly getting better Weight 141 pounds,  close to his baseline Wife giving him his medications, making sure he is compliant dietary indiscretion Drinking wine not hard alcohol, chronic issue  We did receive a phone call from visiting nurse blood pressure 180/98 Chronic leg swelling Weight was up to 151 pounds He was taking Lasix 80 in the morning 40 in the evening Swelling better in the morning worse in the evening Nurse reported continued poor diet  Last lab work reviewed creatinine 0.86 BUN 27 On discussion with patient and patient's wife,  "eats like a hog" "pees a lot", feels up 2-3 urine jugs a day Wife reports that last week he cut back on his fluids at her urging Prior to that was drinking too much fluids, lots of soda He shows no restraint Wearing compression hose, she does not feel that it is harder to get the compression hose on the normal  Echocardiogram performed yesterday reviewed personally by myself, images pulled up in the office,  showing persistent ejection fraction 25%, global hypokinesis Unable to accurately estimate right ventricular systolic pressure  Other past medical history reviewed  In December 2019 he was admitted to [facility] regional in the setting of DKA.  He is followed by primary care at the [facility]   Weight typically fluctuates between 138 and 145 at home based on prior clinic visit March 14, 2018 previously eating out every day for lunch  Following that visit in our clinic he presented to the hospital with hypoglycemia and altered mental status March 21, 2018 Notes indicating he had not been taking his insulin, was more confused Hypertensive on arrival to the emergency room systolic pressure 83 Creatinine 2.54 BUN 71 He was hypothermic temperature 93 degrees,  and with broad-spectrum antibiotics Blood pressure improved with IV fluids Renal failure improved   Prior CV studies:   The following studies were reviewed today:  Echocardiogram April 2019 Left ventricle: The cavity size was  normal. There was moderate   concentric hypertrophy. Systolic function was severely reduced.   The estimated ejection fraction was in the range of 20% to 25%.   Diffuse hypokinesis. Regional wall motion abnormalities cannot be   excluded. Doppler parameters are consistent with abnormal left   ventricular relaxation (grade 1 diastolic dysfunction). - Left atrium: The atrium was normal in size. - Right ventricle: Systolic function was normal. - Pulmonary arteries: Systolic pressure could not be accurately   estimated.   Past Medical History:  Diagnosis Date  . Chronic combined systolic (congestive) and diastolic (congestive) heart failure ([place])    a. TTE 6/15: EF < 20%, mildly dilated LV, DD, mildly dilated LA, mod dilated RA, mild MR, mild to mod TR, mod increased posterior wall thickness, elevated LA and LVEDP; b. 4/12019 Echo: EF 20-25%, diff HK. Gr1 DD, nl RV fxn.  . CKD (chronic kidney disease), stage II   . Diabetes mellitus with complication ([place])    a. Prior admissions w/ DKA (last 12/2017).  . Essential hypertension   . NICM (nonischemic cardiomyopathy) ([place])    a. 06/2013 Echo: EF<20%; b. 06/2013 Cath: no signif dzs; c. 04/2017 Echo: 20-25%, gr1 DD.  Marland Kitchen Polysubstance abuse ([place])    a. etoh and tobacco  . Stroke [facility])    Past Surgical History:  Procedure Laterality Date  . NO PAST SURGERIES       No outpatient medications have been marked as taking for the 06/15/18 encounter (Appointment) with Minna Merritts, MD.     Allergies:   Patient has no known allergies.   Social History   Tobacco Use  . Smoking status: Current Every Day Smoker    Packs/day: 0.50    Types: Cigarettes  . Smokeless tobacco: Never Used  Substance Use Topics  . Alcohol use: Not Currently    Frequency: Never  . Drug use: Not on file     Family Hx: The patient's family history includes Congestive Heart Failure in his brother and mother; Diabetes in his mother.  ROS:   Please see the history  of present illness.    Review of Systems  Constitutional: Negative.   Respiratory: Negative.   Cardiovascular: Negative.   Gastrointestinal: Negative.   Musculoskeletal: Negative.        Legs weak  Neurological: Negative.   Psychiatric/Behavioral: Negative.   All other systems reviewed and are negative.   Labs/Other Tests and Data Reviewed:    Recent Labs: 03/21/2018: ALT 19; B Natriuretic Peptide 374.0 03/22/2018: Magnesium 2.4 03/23/2018: BUN 27; Creatinine, Ser 0.86; Hemoglobin 12.1; Platelets 251; Potassium 3.8; Sodium 142   Recent Lipid Panel Lab Results  Component Value Date/Time   CHOL 152 06/18/2013 04:27 AM   TRIG 67 06/18/2013 04:27 AM   HDL 80 (H) 06/18/2013 04:27 AM   LDLCALC 59 06/18/2013 04:27 AM    Wt Readings from Last 3 Encounters:  05/12/18 156 lb (70.8 kg)  03/21/18 132 lb 15 oz (60.3 kg)  03/14/18 142 lb 4 oz (64.5 kg)     Exam:    Vital Signs:  There were no vitals taken for this visit.   Well nourished, well developed male in no acute distress. HEENT: No masses, nontender Lungs: No coughing, wheezing  Abdomen nontender, nondistended Lower extremities no  significant lower extremity edema   ASSESSMENT & PLAN:    1.  Chronic systolic CHF (congestive heart failure) ([name]) Poor diet, high fluid intake, continues to drink wine Wife trying to get him to comply Baseline weight likely 145 or less currently 151 We have recommended he take metolazone 5 mg prior to Lasix with 20 of potassium for weight 150 or higher -At baseline will stay on Lasix 80 in the morning 40 in the afternoon We will add potassium 10 daily  2. Type 2 diabetes mellitus with complication, with long-term current use of insulin ([name]) Prior noncompliance with insulin Wife with difficulty adjusting his diet, as he is so noncompliant If he misses a meal tends to run low but drinks a lot of soda and junk food  3. Diabetic ketoacidosis without coma associated with type 2 diabetes  mellitus ([place]) Previous hospitalization,  She is trying to work on his diet, he is very noncompliant  4. NICM (nonischemic cardiomyopathy) ([place]) Prior history of alcohol abuse  drinking red wine Recommended alcohol cessation Likely contributing to his dilated cardiomyopathy Medication changes as above  5. Polysubstance abuse ([place]) Recommended smoking cessation, and stopping alcohol  6. Smoker Smoking cessation recommended He does not want Chantix  7. Alcohol abuse drinking red wine, cessation recommended We will encourage nurses to work with him  COVID-19 Education: The signs and symptoms of COVID-19 were discussed with the patient and how to seek care for testing (follow up with PCP or arrange E-visit).  The importance of social distancing was discussed today.  Patient Risk:   After full review of this patients clinical status, I feel that they are at least moderate risk at this time.  Time:   Today, I have spent 25 minutes with the patient with telehealth technology discussing nonischemic cardiomyopathy, medication compliance strategies, diabetes diet, alcohol cessation, importance of taking diabetes medications, signs and symptoms of DKA.     Medication Adjustments/Labs and Tests Ordered: Current medicines are reviewed at length with the patient today.  Concerns regarding medicines are outlined above.   Tests Ordered: No orders of the defined types were placed in this encounter.  Medication Changes: No orders of the defined types were placed in this encounter.   Disposition:  in 4 week(s) telephone visit  Signed, Ida Rogue, MD  06/14/2018 12:57 PM    [facility] [place], [place], [place]  [address] Phone: [phone]; Fax: [phone]

## 2018-06-15 ENCOUNTER — Other Ambulatory Visit: Payer: Self-pay

## 2018-06-15 ENCOUNTER — Telehealth: Payer: Self-pay | Admitting: Cardiovascular Disease

## 2018-06-15 ENCOUNTER — Telehealth: Payer: Medicaid Other | Admitting: Cardiovascular Disease

## 2018-06-15 NOTE — Telephone Encounter (Signed)
Spoke with patients wife per release form and reviewed that Dr. Rockey Situ had a delay in schedule and that we needed to reschedule. She was agreeable to this and rescheduled to next week. She states that he is doing much better and that medication really did help him. She states it took all of that swelling off. Confirmed appointment date and time with instructions to please call if any questions or concerns before then. She verbalized understanding of our conversation and had no further needs at this time.

## 2018-06-20 ENCOUNTER — Telehealth: Payer: Medicaid Other | Admitting: Cardiovascular Disease

## 2018-06-20 ENCOUNTER — Other Ambulatory Visit: Payer: Self-pay

## 2018-06-20 NOTE — Progress Notes (Unsigned)
Did not pick up phone for evisit

## 2018-06-29 ENCOUNTER — Telehealth: Payer: Self-pay | Admitting: Cardiovascular Disease

## 2018-06-29 MED ORDER — POTASSIUM CHLORIDE ER 10 MEQ PO TBCR: EXTENDED_RELEASE_TABLET | ORAL | 0 refills | Status: DC

## 2018-06-29 MED ORDER — SACUBITRIL-VALSARTAN 97-103 MG PO TABS: 1.0000 | ORAL_TABLET | Freq: Two times a day (BID) | ORAL | 0 refills | Status: DC

## 2018-06-29 NOTE — Telephone Encounter (Signed)
°*  STAT* If patient is at the pharmacy, call can be transferred to refill team.   1. Which medications need to be refilled? (please list name of each medication and dose if known) Entresto 97-103 mg  and Potassium Chloride 10 MEQ Tablet  2. Which pharmacy/location (including street and city if local pharmacy) is medication to be sent to? [facility]  [address], [place], [place] [address]   3. Do they need a 30 day or 90 day supply? 90 day

## 2018-06-29 NOTE — Telephone Encounter (Signed)
Requested Prescriptions   Signed Prescriptions Disp Refills  . potassium chloride (K-DUR) 10 MEQ tablet 135 tablet 0    Sig: Take 1 tablet (10 meq) by mouth once daily except on days you take metolazone, take 2 tablets (20 meq) by mouth x 1 dose    Authorizing Provider: Minna Merritts    Ordering User: NEWCOMER MCCLAIN, BRANDY L  . sacubitril-valsartan (ENTRESTO) 97-103 MG 180 tablet 0    Sig: Take 1 tablet by mouth 2 (two) times daily.    Authorizing Provider: Minna Merritts    Ordering User: Raelene Bott, BRANDY L

## 2018-06-30 ENCOUNTER — Telehealth: Payer: Self-pay

## 2018-06-30 NOTE — Telephone Encounter (Signed)
Prior Authorization sent through [place] at [phone] for Entresto 97/103 mg take one tablet twice a day.  Spoke with [place] with [facility] [employer]. Prior Auth # [id number]   Telephone Trans. ID # # [id number] Per Renford Dills with [place] [employer] the prior authorization for [name] has been sent for review and will take up to 24 hours for a response.

## 2018-07-05 ENCOUNTER — Telehealth: Payer: Self-pay | Admitting: Cardiovascular Disease

## 2018-07-05 NOTE — Telephone Encounter (Signed)
Please call to discuss PA for [facility]

## 2018-07-05 NOTE — Telephone Encounter (Addendum)
Spoke with [place] pharmacist tech regarding the PA for Entresto 97-103 mg; the documentation was not correctly taken down for the question regarding angioedema. The answer that was given by me to [facility] representative) was "no" but she put yes therefore the PA for [name] was denied. I will resubmit this PA for Entresto once I have printed the resubmit form tomorrow when I'm in clinic.    I have reached out to the office to check for samples for the patient since he only has four pills left and he takes this twice a day.  I have spoken with the wife regarding the PA denial.

## 2018-07-06 NOTE — Telephone Encounter (Signed)
The patient is about out of the Entresto 97/103 mg and due to a holiday tomorrow, the patient will have no medication through the weekend. We haven't heard back regarding the appeal for the [facility] as of July 06, 2018. We have samples of Entresto 49/51 mg if you feel the patient can double the pills until the approval for Entresto 97/103 mg. Please advise.

## 2018-07-06 NOTE — Telephone Encounter (Signed)
Notified the patients wife Shawn [name]) who is on the patients DPR per [name], NP the patient can take Entresto 49/51 mg tablets, take two tablets twice a day until the patients approval for the Entresto 97-103 mg.  Medication Samples have been provided to the patient.  Drug name: [name]    Strength: 49-51 mg        Qty: 56 LOT: [id number] Exp.Date: 04/2020 Dosing instructions: Take two tablets twice a day.   The patient has been instructed regarding the correct time, dose, and frequency of taking this medication, including desired effects and most common side effects.   [name] 2:17 PM 07/06/2018

## 2018-07-06 NOTE — Telephone Encounter (Signed)
Yes. Pls provide 49/51, 2 tabs BID until he can get the 97/103 dose. Thanks you.

## 2018-07-10 ENCOUNTER — Telehealth: Payer: Self-pay

## 2018-07-10 NOTE — Telephone Encounter (Signed)
ERROR

## 2018-07-10 NOTE — Telephone Encounter (Signed)
Entresto 97-103 mg is approved with the start date of 07-06-2018 to end date of 06-30-2019 per [place] (pharmacy tech) with [employer].  The patient's wife [name]) has been notified as well as Consulting civil engineer.

## 2018-07-10 NOTE — Progress Notes (Signed)
Virtual Visit via Telephone Note   This visit type was conducted due to national recommendations for restrictions regarding the COVID-19 Pandemic (e.g. social distancing) in an effort to limit this patient's exposure and mitigate transmission in our community.  Due to his co-morbid illnesses, this patient is at least at moderate risk for complications without adequate follow up.  This format is felt to be most appropriate for this patient at this time.  The patient did not have access to video technology/had technical difficulties with video requiring transitioning to audio format only (telephone).  All issues noted in this document were discussed and addressed.  No physical exam could be performed with this format.  Please refer to the patient's chart for his  consent to telehealth for [facility].   I connected with  Shawn [name] on 07/11/18 by a video enabled telemedicine application and verified that I am speaking with the correct person using two identifiers. I discussed the limitations of evaluation and management by telemedicine. The patient expressed understanding and agreed to proceed.   Evaluation Performed:  Follow-up visit  Date:  07/11/2018   ID:  Shawn [name], DOB 06/06/1955, MRN [id number]  Patient Location:  [address] [place] [address]   Provider location:   [facility], [place] office  PCP:  [place]  Cardiologist:  [facility]   Chief Complaint: Weight loss   History of Present Illness:    Shawn [name] is a 63 y.o. male who presents via audio/video conferencing for a telehealth visit today.   The patient does not symptoms concerning for COVID-19 infection (fever, chills, cough, or new SHORTNESS OF BREATH).   Patient has a past medical history of nonischemic cardiomyopathy, ejection fraction less than 20% dating back to 2015 Prior catheterization June 2015, poorly controlled insulin-dependent  diabetes,  prior strokes,  polysubstance abuse including alcohol,  smoking 1 pack/day who continues to smoke,  Recent hospitalization for DKA hemoglobin A1c greater than 15 Started on insulin infusion Chronic mild to moderate bilateral lower extremity swelling which typically worsens throughout the day and is improved in the morning. Who presentsfor follow-up of hisnonischemic dilatedcardiomyopathy, heart failure  [facility]ues on [facility] him, has visiting Nurse Typically systolic pressure 858  Stopped drinking wine per the wife  Sugars running better, around 100  No edema,  Walks better, leg strength better On lasix 40 daily, potassium daily Off metolazone  Prior weight 151 pounds then on last telemetry visit 141 pounds March 2020 now down to 135 pounds Wife reports he is eating better  Prior phone call from visiting nurse for hypertension, blood pressure better now   metolazone 5 mg prior to Lasix for weight 150 pounds or higher Has not been using metolazone given weight stable   Prior CV studies:   The following studies were reviewed today:    Past Medical History:  Diagnosis Date  . Chronic combined systolic (congestive) and diastolic (congestive) heart failure ([place])    a. TTE 6/15: EF < 20%, mildly dilated LV, DD, mildly dilated LA, mod dilated RA, mild MR, mild to mod TR, mod increased posterior wall thickness, elevated LA and LVEDP; b. 4/12019 Echo: EF 20-25%, diff HK. Gr1 DD, nl RV fxn.  . CKD (chronic kidney disease), stage II   . Diabetes mellitus with complication ([place])    a. Prior admissions w/ DKA (last 12/2017).  . Essential hypertension   . NICM (nonischemic  cardiomyopathy) ([place])    a. 06/2013 Echo: EF<20%; b. 06/2013 Cath: no signif dzs; c. 04/2017 Echo: 20-25%, gr1 DD.  Marland Kitchen Polysubstance abuse ([place])    a. etoh and tobacco  . Stroke [facility])    Past Surgical History:  Procedure Laterality Date  . NO PAST SURGERIES       No outpatient  medications have been marked as taking for the 07/11/18 encounter (Telemedicine) with Minna Merritts, MD.     Allergies:   Patient has no known allergies.   Social History   Tobacco Use  . Smoking status: Current Every Day Smoker    Packs/day: 0.50    Types: Cigarettes  . Smokeless tobacco: Never Used  Substance Use Topics  . Alcohol use: Not Currently    Frequency: Never  . Drug use: Not on file     Current Outpatient Medications on File Prior to Visit  Medication Sig Dispense Refill  . aspirin 81 MG chewable tablet Chew 1 tablet (81 mg total) by mouth daily. 30 tablet 1  . atorvastatin (LIPITOR) 40 MG tablet Take 1 tablet (40 mg total) by mouth at bedtime. 30 tablet 0  . baclofen (LIORESAL) 10 MG tablet Take 10 mg by mouth 2 (two) times daily as needed for muscle spasms. Muscle spasm    . carvedilol (COREG) 12.5 MG tablet Take 1 tablet (12.5 mg total) by mouth 2 (two) times daily with a meal. 180 tablet 0  . cetirizine (ZYRTEC) 10 MG tablet TAKE 1 TABLET BY MOUTH DAILY 90 tablet 0  . furosemide (LASIX) 40 MG tablet Take 2 tablets (80 mg total) by mouth every morning AND 1 tablet (40 mg total) every evening. 270 tablet 3  . hydrALAZINE (APRESOLINE) 100 MG tablet Take 100 mg by mouth 2 (two) times daily.    . insulin aspart (NOVOLOG) 100 UNIT/ML injection Inject 5 Units into the skin 3 (three) times daily with meals.    . insulin glargine (LANTUS) 100 UNIT/ML injection Inject 15 Units into the skin at bedtime.    . metolazone (ZAROXOLYN) 5 MG tablet Take 1 tablet (5 mg) by mouth x 1 dose, 30 minutes prior to morning lasix on days your weight is 150 lbs or above 10 tablet 3  . Multiple Vitamin (MULTIVITAMIN WITH MINERALS) TABS tablet Take 1 tablet by mouth daily. 30 tablet 1  . nicotine (NICODERM CQ - DOSED IN MG/24 HOURS) 14 mg/24hr patch Place 1 patch (14 mg total) onto the skin daily. 28 patch 0  . potassium chloride (K-DUR) 10 MEQ tablet Take 1 tablet (10 meq) by mouth once daily  except on days you take metolazone, take 2 tablets (20 meq) by mouth x 1 dose 135 tablet 0  . sacubitril-valsartan (ENTRESTO) 97-103 MG Take 1 tablet by mouth 2 (two) times daily. 180 tablet 0  . spironolactone (ALDACTONE) 25 MG tablet Take 25 mg by mouth at bedtime.    Marland Kitchen venlafaxine (EFFEXOR) 37.5 MG tablet Take 37.5 mg by mouth daily.     No current facility-administered medications on file prior to visit.      Family Hx: The patient's family history includes Congestive Heart Failure in his brother and mother; Diabetes in his mother.  ROS:   Please see the history of present illness.    Review of Systems  Constitutional: Negative.   HENT: Negative.   Respiratory: Negative.   Cardiovascular: Negative.   Gastrointestinal: Negative.   Musculoskeletal: Negative.   Neurological: Negative.   Psychiatric/Behavioral: Negative.  All other systems reviewed and are negative.     Labs/Other Tests and Data Reviewed:    Recent Labs: 03/21/2018: ALT 19; B Natriuretic Peptide 374.0 03/22/2018: Magnesium 2.4 03/23/2018: BUN 27; Creatinine, Ser 0.86; Hemoglobin 12.1; Platelets 251; Potassium 3.8; Sodium 142   Recent Lipid Panel Lab Results  Component Value Date/Time   CHOL 152 06/18/2013 04:27 AM   TRIG 67 06/18/2013 04:27 AM   HDL 80 (H) 06/18/2013 04:27 AM   LDLCALC 59 06/18/2013 04:27 AM    Wt Readings from Last 3 Encounters:  05/12/18 156 lb (70.8 kg)  03/21/18 132 lb 15 oz (60.3 kg)  03/14/18 142 lb 4 oz (64.5 kg)     Exam:    Vital Signs: Vital signs may also be detailed in the HPI There were no vitals taken for this visit.  Wt Readings from Last 3 Encounters:  05/12/18 156 lb (70.8 kg)  03/21/18 132 lb 15 oz (60.3 kg)  03/14/18 142 lb 4 oz (64.5 kg)   Temp Readings from Last 3 Encounters:  03/24/18 98.4 F (36.9 C) (Oral)  12/17/17 (!) 97.5 F (36.4 C) (Oral)  12/03/17 97.9 F (36.6 C) (Oral)   BP Readings from Last 3 Encounters:  03/24/18 123/83  03/14/18  124/70  12/17/17 110/83   Pulse Readings from Last 3 Encounters:  03/24/18 73  03/14/18 76  12/17/17 82    120/89 Weight 135 pounds  Well nourished, well developed male in no acute distress. Constitutional:  oriented to person, place, and time. No distress.   ASSESSMENT & PLAN:    Problem List Items Addressed This Visit      Cardiology Problems   Essential hypertension   Chronic systolic CHF (congestive heart failure) ([name]) - Primary   NICM (nonischemic cardiomyopathy) ([place])     Other   Diabetic ketoacidosis without coma associated with type 2 diabetes mellitus ([place])   Type 2 diabetes mellitus with complication, with long-term current use of insulin ([place])   Smoker   Polysubstance abuse ([place])    Other Visit Diagnoses    CKD (chronic kidney disease), stage II           1.  Chronic systolic CHF (congestive heart failure) ([name]) Weight down to 135 pounds, appears to be at his baseline Tolerating current medications, Reports he stopped drinking alcohol No changes to his medications Continue Lasix 40 daily with potassium  2. Type 2 diabetes mellitus with complication, with long-term current use of insulin ([name]) Prior noncompliance with insulin Wife reports he is much more compliance, sugars controlled  3. Diabetic ketoacidosis without coma associated with type 2 diabetes mellitus ([place]) Followed by visiting nurse, no recent hospitalizations Sugars better controlled Medication compliance  4. NICM (nonischemic cardiomyopathy) ([place]) Prior history of alcohol abuse  drinking red wine He has stopped drinking Likely contributing to his dilated cardiomyopathy Continue current medications  5. Polysubstance abuse ([place]) Smoking cessation, stop alcohol  6. Smoker Smoking cessation recommended He does not want Chantix  7. Alcohol abuse Previously drinking red wine, cessation recommended   COVID-19 Education: The signs and symptoms of COVID-19 were discussed with  the patient and how to seek care for testing (follow up with PCP or arrange E-visit).  The importance of social distancing was discussed today.  Patient Risk:   After full review of this patients clinical status, I feel that they are at least moderate risk at this time.  Time:   Today, I have spent 25 minutes with the patient with  telehealth technology discussing the cardiac and medical problems/diagnoses detailed above   10 min spent reviewing the chart prior to patient visit today   Medication Adjustments/Labs and Tests Ordered: Current medicines are reviewed at length with the patient today.  Concerns regarding medicines are outlined above.   Tests Ordered: No tests ordered   Medication Changes: No changes made   Disposition: Follow-up in 6 months   Signed, Ida Rogue, MD  07/11/2018 12:17 PM    [place] Office [address] [place] [address], [place], [place] [address]

## 2018-07-11 ENCOUNTER — Other Ambulatory Visit: Payer: Self-pay

## 2018-07-11 ENCOUNTER — Telehealth (INDEPENDENT_AMBULATORY_CARE_PROVIDER_SITE_OTHER): Payer: Medicaid Other | Admitting: Cardiovascular Disease

## 2018-07-11 DIAGNOSIS — I428 Other cardiomyopathies: Secondary | ICD-10-CM

## 2018-07-11 DIAGNOSIS — F1910 Other psychoactive substance abuse, uncomplicated: Secondary | ICD-10-CM

## 2018-07-11 DIAGNOSIS — E118 Type 2 diabetes mellitus with unspecified complications: Secondary | ICD-10-CM

## 2018-07-11 DIAGNOSIS — N182 Chronic kidney disease, stage 2 (mild): Secondary | ICD-10-CM | POA: Diagnosis not present

## 2018-07-11 DIAGNOSIS — E1122 Type 2 diabetes mellitus with diabetic chronic kidney disease: Secondary | ICD-10-CM | POA: Diagnosis not present

## 2018-07-11 DIAGNOSIS — I10 Essential (primary) hypertension: Secondary | ICD-10-CM

## 2018-07-11 DIAGNOSIS — I130 Hypertensive heart and chronic kidney disease with heart failure and stage 1 through stage 4 chronic kidney disease, or unspecified chronic kidney disease: Secondary | ICD-10-CM

## 2018-07-11 DIAGNOSIS — F1010 Alcohol abuse, uncomplicated: Secondary | ICD-10-CM

## 2018-07-11 DIAGNOSIS — I5022 Chronic systolic (congestive) heart failure: Secondary | ICD-10-CM

## 2018-07-11 DIAGNOSIS — F17200 Nicotine dependence, unspecified, uncomplicated: Secondary | ICD-10-CM

## 2018-07-11 DIAGNOSIS — Z794 Long term (current) use of insulin: Secondary | ICD-10-CM

## 2018-07-11 DIAGNOSIS — E1110 Type 2 diabetes mellitus with ketoacidosis without coma: Secondary | ICD-10-CM

## 2018-07-11 DIAGNOSIS — Z720 Tobacco use: Secondary | ICD-10-CM

## 2018-07-11 NOTE — Patient Instructions (Signed)
Labs as below  Medication Instructions:  No changes  If you need a refill on your cardiac medications before your next appointment, please call your pharmacy.    Lab work: [employer] in hospital lobby or through visiting [place] that comes to his house   If you have labs (blood work) drawn today and your tests are completely normal, you will receive your results only by: Marland Kitchen MyChart Message (if you have MyChart) OR . A paper copy in the mail If you have any lab test that is abnormal or we need to change your treatment, we will call you to review the results.   Testing/Procedures: No new testing needed   Follow-Up: At [facility], you and your health needs are our priority.  As part of our continuing mission to provide you with exceptional heart care, we have created designated Provider Care Teams.  These Care Teams include your primary Cardiologist (physician) and Advanced Practice Providers (APPs -  Physician Assistants and Nurse Practitioners) who all work together to provide you with the care you need, when you need it.  . You will need a follow up appointment in 6 months .   Please call our office 2 months in advance to schedule this appointment.    . Providers on your designated Care Team:   . Murray Hodgkins, NP . Christell Faith, PA-C . Marrianne Mood, PA-C  Any Other Special Instructions Will Be Listed Below (If Applicable).  For educational health videos Log in to : [email or web address] Or : [email or web address], password : [email or web address]

## 2018-07-12 ENCOUNTER — Other Ambulatory Visit
Admission: RE | Admit: 2018-07-12 | Discharge: 2018-07-12 | Disposition: A | Discharge status: Home / Self Care | Payer: Medicaid Other | Source: Ambulatory Visit | Attending: Cardiovascular Disease | Admitting: Cardiovascular Disease

## 2018-07-12 ENCOUNTER — Telehealth: Payer: Self-pay

## 2018-07-12 DIAGNOSIS — I5022 Chronic systolic (congestive) heart failure: Secondary | ICD-10-CM

## 2018-07-12 LAB — BASIC METABOLIC PANEL
Anion gap: 11 (ref 5–15)
BUN: 21 mg/dL (ref 8–23)
CO2: 27 mmol/L (ref 22–32)
Calcium: 9.1 mg/dL (ref 8.9–10.3)
Chloride: 103 mmol/L (ref 98–111)
Creatinine, Ser: 1.16 mg/dL (ref 0.61–1.24)
GFR calc Af Amer: >60 mL/min (ref >60)
GFR calc non Af Amer: >60 mL/min (ref >60)
Glucose, Bld: 257 mg/dL — ABNORMAL HIGH (ref 70–99)
Potassium: 4 mmol/L (ref 3.5–5.1)
Sodium: 141 mmol/L (ref 135–145)

## 2018-07-12 NOTE — Telephone Encounter (Signed)
Spoke with the pt wife. Adv her that Dr.Gollan would like the pt to have labwork. Pt wife sts that she will have the pt go to the [facility] medical mall to have it done. Bmet order in [place]. Adv her that he is to wear a face covering if available. Pt wife verbalized understanding and voiced appreciation for the call.

## 2018-07-19 ENCOUNTER — Other Ambulatory Visit: Payer: Self-pay | Admitting: Cardiovascular Disease

## 2018-07-20 ENCOUNTER — Telehealth: Payer: Self-pay | Admitting: Cardiovascular Disease

## 2018-07-20 NOTE — Telephone Encounter (Signed)
Does not sound like fluid, weight is well below prior numbers (was in the 150s before) Sounds like allergy,  Try pepcid/benadryl Call if no better

## 2018-07-20 NOTE — Telephone Encounter (Signed)
Pt c/o swelling: STAT is pt has developed SOB within 24 hours  1) How much weight have you gained and in what time span? Yes , over month  2) If swelling, where is the swelling located? Face  3) Are you currently taking a fluid pill? yes  4) Are you currently SOB? no  5) Do you have a log of your daily weights (if so, list)? montth ago weighed 131, now 147  6) Have you gained 3 pounds in a day or 5 pounds in a week? yes  7) Have you traveled recently? no  BP is 160 over "something"    Patient wife is calling, states he is on his way to where she is.

## 2018-07-20 NOTE — Telephone Encounter (Signed)
I spoke with the patient's wife, ok per DPR. She is aware of Dr. Donivan Scull recommendations. She voices understanding and is agreeable.

## 2018-07-20 NOTE — Telephone Encounter (Signed)
I spoke with the patient's wife (ok per DPR).  She states that patient woke up this morning with swelling in his face (mainly his jaw area). He is having no lower extremity/ abdominal swelling.   He is currently taking lasix 40 mg once daily and urinating adequately on this dose.  Per Dr. Donivan Scull note- seems that his baseline weight is ~ 135 lbs. He last weighed this about 2 weeks ago. This morning he was 140.3 lbs. BP was 154/84  I inquired if the patient had eaten anything out of his normal last night. She states he had some homemade chocolate pie that he has never had before.   She states his facial swelling has improved, but she wanted to let us know what happened. I advised it almost sounds that he was having an allergic reaction to something, although I cannot be sure.  She has been advised to obtain benadryl for home and have this on hand if needed. She is also aware I will send this to Dr. Rockey Situ to review and advise if needed.   She voices understanding and is agreeable.

## 2018-07-21 NOTE — Telephone Encounter (Signed)
Called patient with normal lab results, spoke with patient's wife.  Wife stated the swelling of his face from yesterday has gone done, patient is feeling fine today.

## 2018-07-26 ENCOUNTER — Telehealth: Payer: Self-pay | Admitting: Cardiovascular Disease

## 2018-07-26 ENCOUNTER — Other Ambulatory Visit: Payer: Self-pay | Admitting: Cardiovascular Disease

## 2018-07-26 NOTE — Telephone Encounter (Signed)
This Rx has been sent to the patients pharmacy via pharmacy request.

## 2018-07-26 NOTE — Telephone Encounter (Signed)
°*  STAT* If patient is at the pharmacy, call can be transferred to refill team.   1. Which medications need to be refilled? (please list name of each medication and dose if known) cetirizine 10 mg daily  2. Which pharmacy/location (including street and city if local pharmacy) is medication to be sent [facility]  3. Do they need a 30 day or 90 day supply? [place]

## 2018-08-07 ENCOUNTER — Other Ambulatory Visit: Payer: Self-pay

## 2018-08-07 ENCOUNTER — Emergency Department: Payer: No Typology Code available for payment source

## 2018-08-07 ENCOUNTER — Inpatient Hospital Stay
Admission: EM | Admit: 2018-08-07 | Discharge: 2018-08-09 | DRG: 638 | Disposition: A | Discharge status: Home / Self Care | Payer: No Typology Code available for payment source | Attending: Internal Medicine | Admitting: Internal Medicine

## 2018-08-07 DIAGNOSIS — Z91138 Patient's unintentional underdosing of medication regimen for other reason: Secondary | ICD-10-CM

## 2018-08-07 DIAGNOSIS — E875 Hyperkalemia: Secondary | ICD-10-CM | POA: Diagnosis not present

## 2018-08-07 DIAGNOSIS — Z833 Family history of diabetes mellitus: Secondary | ICD-10-CM

## 2018-08-07 DIAGNOSIS — Z79899 Other long term (current) drug therapy: Secondary | ICD-10-CM | POA: Diagnosis not present

## 2018-08-07 DIAGNOSIS — I5042 Chronic combined systolic (congestive) and diastolic (congestive) heart failure: Secondary | ICD-10-CM | POA: Diagnosis present

## 2018-08-07 DIAGNOSIS — N183 Chronic kidney disease, stage 3 (moderate): Secondary | ICD-10-CM | POA: Diagnosis present

## 2018-08-07 DIAGNOSIS — Z794 Long term (current) use of insulin: Secondary | ICD-10-CM | POA: Diagnosis not present

## 2018-08-07 DIAGNOSIS — Z8249 Family history of ischemic heart disease and other diseases of the circulatory system: Secondary | ICD-10-CM | POA: Diagnosis not present

## 2018-08-07 DIAGNOSIS — F17210 Nicotine dependence, cigarettes, uncomplicated: Secondary | ICD-10-CM | POA: Diagnosis present

## 2018-08-07 DIAGNOSIS — R531 Weakness: Secondary | ICD-10-CM | POA: Diagnosis present

## 2018-08-07 DIAGNOSIS — Z8673 Personal history of transient ischemic attack (TIA), and cerebral infarction without residual deficits: Secondary | ICD-10-CM | POA: Diagnosis not present

## 2018-08-07 DIAGNOSIS — Z20828 Contact with and (suspected) exposure to other viral communicable diseases: Secondary | ICD-10-CM | POA: Diagnosis not present

## 2018-08-07 DIAGNOSIS — I130 Hypertensive heart and chronic kidney disease with heart failure and stage 1 through stage 4 chronic kidney disease, or unspecified chronic kidney disease: Secondary | ICD-10-CM | POA: Diagnosis present

## 2018-08-07 DIAGNOSIS — E860 Dehydration: Secondary | ICD-10-CM | POA: Diagnosis present

## 2018-08-07 DIAGNOSIS — E1122 Type 2 diabetes mellitus with diabetic chronic kidney disease: Secondary | ICD-10-CM | POA: Diagnosis present

## 2018-08-07 DIAGNOSIS — N179 Acute kidney failure, unspecified: Secondary | ICD-10-CM | POA: Diagnosis not present

## 2018-08-07 DIAGNOSIS — E1165 Type 2 diabetes mellitus with hyperglycemia: Secondary | ICD-10-CM | POA: Diagnosis not present

## 2018-08-07 DIAGNOSIS — E861 Hypovolemia: Secondary | ICD-10-CM | POA: Diagnosis present

## 2018-08-07 DIAGNOSIS — Z7982 Long term (current) use of aspirin: Secondary | ICD-10-CM | POA: Diagnosis not present

## 2018-08-07 DIAGNOSIS — I428 Other cardiomyopathies: Secondary | ICD-10-CM | POA: Diagnosis present

## 2018-08-07 DIAGNOSIS — E1110 Type 2 diabetes mellitus with ketoacidosis without coma: Secondary | ICD-10-CM | POA: Diagnosis not present

## 2018-08-07 DIAGNOSIS — T383X6A Underdosing of insulin and oral hypoglycemic [antidiabetic] drugs, initial encounter: Secondary | ICD-10-CM | POA: Diagnosis present

## 2018-08-07 DIAGNOSIS — E1310 Other specified diabetes mellitus with ketoacidosis without coma: Secondary | ICD-10-CM

## 2018-08-07 DIAGNOSIS — I429 Cardiomyopathy, unspecified: Secondary | ICD-10-CM | POA: Diagnosis not present

## 2018-08-07 LAB — GLUCOSE, CAPILLARY
Glucose-Capillary: 485 mg/dL — ABNORMAL HIGH (ref 70–99)
Glucose-Capillary: 513 mg/dL (ref 70–99)
Glucose-Capillary: >600 mg/dL (ref 70–99)
Glucose-Capillary: >600 mg/dL (ref 70–99)
Glucose-Capillary: >600 mg/dL (ref 70–99)

## 2018-08-07 LAB — COMPREHENSIVE METABOLIC PANEL
ALT: 18 U/L (ref 0–44)
AST: 24 U/L (ref 15–41)
Albumin: 3 g/dL — ABNORMAL LOW (ref 3.5–5.0)
Alkaline Phosphatase: 110 U/L (ref 38–126)
Anion gap: 28 — ABNORMAL HIGH (ref 5–15)
BUN: 79 mg/dL — ABNORMAL HIGH (ref 8–23)
CO2: 11 mmol/L — ABNORMAL LOW (ref 22–32)
Calcium: 8.4 mg/dL — ABNORMAL LOW (ref 8.9–10.3)
Chloride: 92 mmol/L — ABNORMAL LOW (ref 98–111)
Creatinine, Ser: 2.49 mg/dL — ABNORMAL HIGH (ref 0.61–1.24)
GFR calc Af Amer: 31 mL/min — ABNORMAL LOW (ref >60)
GFR calc non Af Amer: 27 mL/min — ABNORMAL LOW (ref >60)
Glucose, Bld: 954 mg/dL (ref 70–99)
Potassium: 5.2 mmol/L — ABNORMAL HIGH (ref 3.5–5.1)
Sodium: 131 mmol/L — ABNORMAL LOW (ref 135–145)
Total Bilirubin: 1.9 mg/dL — ABNORMAL HIGH (ref 0.3–1.2)
Total Protein: 6 g/dL — ABNORMAL LOW (ref 6.5–8.1)

## 2018-08-07 LAB — URINE DRUG SCREEN, QUALITATIVE (ARMC ONLY)
Amphetamines, Ur Screen: NOT DETECTED
Barbiturates, Ur Screen: NOT DETECTED
Benzodiazepine, Ur Scrn: NOT DETECTED
Cannabinoid 50 Ng, Ur ~~LOC~~: NOT DETECTED
Cocaine Metabolite,Ur ~~LOC~~: NOT DETECTED
MDMA (Ecstasy)Ur Screen: NOT DETECTED
Methadone Scn, Ur: NOT DETECTED
Opiate, Ur Screen: NOT DETECTED
Phencyclidine (PCP) Ur S: NOT DETECTED
Tricyclic, Ur Screen: NOT DETECTED

## 2018-08-07 LAB — URINALYSIS, COMPLETE (UACMP) WITH MICROSCOPIC
Bacteria, UA: NONE SEEN
Bilirubin Urine: NEGATIVE
Glucose, UA: >=500 mg/dL — AB
Ketones, ur: 20 mg/dL — AB
Leukocytes,Ua: NEGATIVE
Nitrite: NEGATIVE
Protein, ur: 100 mg/dL — AB
Specific Gravity, Urine: 1.021 (ref 1.005–1.030)
Squamous Epithelial / HPF: NONE SEEN (ref 0–5)
pH: 5 (ref 5.0–8.0)

## 2018-08-07 LAB — CBC
HCT: 36.1 % — ABNORMAL LOW (ref 39.0–52.0)
Hemoglobin: 11.7 g/dL — ABNORMAL LOW (ref 13.0–17.0)
MCH: 29.8 pg (ref 26.0–34.0)
MCHC: 32.4 g/dL (ref 30.0–36.0)
MCV: 91.9 fL (ref 80.0–100.0)
Platelets: 296 10*3/uL (ref 150–400)
RBC: 3.93 MIL/uL — ABNORMAL LOW (ref 4.22–5.81)
RDW: 14.2 % (ref 11.5–15.5)
WBC: 9 10*3/uL (ref 4.0–10.5)
nRBC: 0 % (ref 0.0–0.2)

## 2018-08-07 LAB — SARS CORONAVIRUS 2 BY RT PCR (HOSPITAL ORDER, PERFORMED IN ~~LOC~~ HOSPITAL LAB): SARS Coronavirus 2: NEGATIVE

## 2018-08-07 LAB — BLOOD GAS, VENOUS
Acid-base deficit: 17 mmol/L — ABNORMAL HIGH (ref 0.0–2.0)
Bicarbonate: 9.5 mmol/L — ABNORMAL LOW (ref 20.0–28.0)
O2 Saturation: 86.8 %
Patient temperature: 37
pCO2, Ven: 25 mmHg — ABNORMAL LOW (ref 44.0–60.0)
pH, Ven: 7.19 — CL (ref 7.250–7.430)
pO2, Ven: 66 mmHg — ABNORMAL HIGH (ref 32.0–45.0)

## 2018-08-07 LAB — TROPONIN I (HIGH SENSITIVITY): Troponin I (High Sensitivity): 21 ng/L — ABNORMAL HIGH (ref <18)

## 2018-08-07 MED ORDER — INSULIN REGULAR(HUMAN) IN NACL 100-0.9 UT/100ML-% IV SOLN
INTRAVENOUS | Status: DC
  Administered 2018-08-07: 5.4 [IU]/h via INTRAVENOUS
  Administered 2018-08-08: 15.4 [IU]/h via INTRAVENOUS
  Filled 2018-08-07 (×2): qty 100

## 2018-08-07 MED ORDER — CHLORHEXIDINE GLUCONATE CLOTH 2 % EX PADS
6.0000 | MEDICATED_PAD | Freq: Every day | CUTANEOUS | Status: DC
  Administered 2018-08-08 – 2018-08-09 (×2): 6 via TOPICAL
  Filled 2018-08-07: qty 6

## 2018-08-07 MED ORDER — SODIUM CHLORIDE 0.9 % IV BOLUS
1000.0000 mL | Freq: Once | INTRAVENOUS | Status: AC
  Administered 2018-08-07: 1000 mL via INTRAVENOUS

## 2018-08-07 NOTE — Consult Note (Addendum)
Name: TJ KITCHINGS MRN: [id number] DOB: 17-Jun-1955    ADMISSION DATE:  08/07/2018 CONSULTATION DATE:  08/07/2018  REFERRING MD :  Fritzi Mandes, MD  CHIEF COMPLAINT:  Weakness, hyperglycemia  BRIEF PATIENT DESCRIPTION: 63 yo male admitted with Diabetic ketoacidosis requiring an insulin drip, recent DKA admission 03/2018.  SIGNIFICANT EVENTS / STUDIES:  8/3 > Admitted to Stepdown requiring insulin drip  CULTURES: 8/3 > COVID 19 - negative  HISTORY OF PRESENT ILLNESS:  Mr. Slatten is a 63 yo male with a PMHx of NICM & Combined systolic and diastolic heart failure (EF 25-30% 5/20), ETOH/tobacco abuse, DM (insulin dependent), CVA, CKD stage II (baseline 0.79-1.16) & HTN arrived at ED with complaints of weakness reporting missing several doses of insulin.  Diagnostics significant for: UA- positive ketones/protein, Troponin- 21, glucose- 954, Na+- 131, K+- 5.2, BUN- 79, Cr.-2.49, serum bicarb- 11 & AG- 28. Pt received 1 L NS bolus in ED and was started on an insulin drip.  He reports being so weak he was unable to inject himself with insulin.  He lives with his wife, but she was unable to assist in administering his insulin for unknown reasons. He denied fever/chills, did report diarrhea "3 weeks ago", no vomiting or pain.  He denies drinking alcohol at home, does admit to continuing to smoke cigarettes. Pt admitted to [facility] requiring insulin drip with PCCM consulted for further management.  PAST MEDICAL HISTORY :   has a past medical history of Chronic combined systolic (congestive) and diastolic (congestive) heart failure ([place]), CKD (chronic kidney disease), stage II, Diabetes mellitus with complication ([place]), Essential hypertension, NICM (nonischemic cardiomyopathy) ([place]), Polysubstance abuse ([place]), and Stroke ([place]).  has a past surgical history that includes No past surgeries. Prior to Admission medications   Medication Sig Start Date End Date Taking? Authorizing Provider  aspirin 81  MG chewable tablet Chew 1 tablet (81 mg total) by mouth daily. 04/30/17  Yes Demetrios Loll, MD  atorvastatin (LIPITOR) 40 MG tablet Take 1 tablet (40 mg total) by mouth at bedtime. Patient taking differently: Take 20 mg by mouth at bedtime.  03/24/18  Yes Salary, Avel Peace, MD  B-Complex-C TABS Take 1 tablet by mouth daily.   Yes [provider]  baclofen (LIORESAL) 10 MG tablet Take 10 mg by mouth 2 (two) times daily as needed for muscle spasms. Muscle spasm   Yes [provider]  carvedilol (COREG) 12.5 MG tablet TAKE 1 TABLET BY MOUTH TWICE A DAY WITH A MEAL 07/19/18  Yes Gollan, Kathlene November, MD  furosemide (LASIX) 40 MG tablet Take 40 mg by mouth daily.   Yes [provider]  hydrALAZINE (APRESOLINE) 100 MG tablet Take 100 mg by mouth 3 (three) times daily.    Yes [provider]  insulin aspart (NOVOLOG) 100 UNIT/ML injection Inject 5 Units into the skin 3 (three) times daily with meals.   Yes [provider]  insulin glargine (LANTUS) 100 UNIT/ML injection Inject 15 Units into the skin at bedtime.   Yes [provider]  metolazone (ZAROXOLYN) 5 MG tablet Take 1 tablet (5 mg) by mouth x 1 dose, 30 minutes prior to morning lasix on days your weight is 150 lbs or above Patient taking differently: Take 1 tablet (5 mg) by mouth x 1 dose, 30 minutes prior to morning lasix on days your weight is 150 lbs or above as neeeded 05/12/18  Yes Gollan, Kathlene November, MD  potassium chloride SA (K-DUR) 20 MEQ tablet Take 20  mEq by mouth daily.   Yes [provider]  sacubitril-valsartan (ENTRESTO) 97-103 MG Take 1 tablet by mouth 2 (two) times daily. 06/29/18  Yes Minna Merritts, MD  spironolactone (ALDACTONE) 25 MG tablet Take 25 mg by mouth at bedtime.    [provider]   No Known Allergies  FAMILY HISTORY:  family history includes Congestive Heart Failure in his brother and mother; Diabetes in his mother. SOCIAL HISTORY:  reports that he has  been smoking cigarettes. He has been smoking about 0.50 packs per day. He has never used smokeless tobacco. He reports previous alcohol use.   REVIEW OF SYSTEMS:  Positives in BOLD Constitutional: Negative for fever, chills, weight loss, malaise/fatigue and diaphoresis.  HENT: Negative for hearing loss, ear pain, nosebleeds, congestion, sore throat, neck pain, tinnitus and ear discharge.   Eyes: Negative for blurred vision, double vision, photophobia, pain, discharge and redness.  Respiratory: Negative for cough, hemoptysis, sputum production, shortness of breath, wheezing and stridor.   Cardiovascular: Negative for chest pain, palpitations, orthopnea, claudication, leg swelling and PND.  Gastrointestinal: Negative for heartburn, nausea, vomiting, abdominal pain, diarrhea, constipation, blood in stool and melena.  Genitourinary: Negative for dysuria, urgency, frequency, hematuria and flank pain.  Musculoskeletal: Negative for myalgias, back pain, joint pain and falls.  Skin: Negative for itching and rash.  Neurological: Negative for dizziness, tingling, tremors, sensory change, speech change, focal weakness, seizures, loss of consciousness, generalized weakness and headaches.  Endo/Heme/Allergies: Negative for environmental allergies and polydipsia. Does not bruise/bleed easily.  SUBJECTIVE: "I was so weak these past 2 days I couldn't take my insulin." When asked if his wife was able to help him inject his insulin, he said no he injects himself.  VITAL SIGNS: Temp:  [97.5 F (36.4 C)] 97.5 F (36.4 C) (08/03 1541) Pulse Rate:  [71-74] 73 (08/03 1900) Resp:  [13-20] 15 (08/03 1900) BP: (145-157)/(72-79) 157/79 (08/03 1900) SpO2:  [96 %-99 %] 97 % (08/03 1900)  PHYSICAL EXAMINATION: General:  Pt lying in bed, ill appearing but comfortable Neuro:  Intermittently confused- A&O x 3, no focal sensory/motor deficits, PERRLA HEENT:  Atraumatic, normocephalic, no scleral icterus Cardiovascular:   S1, S2, NSR, no JVD, +2 pitting edema in bilateral feet, +2 palpable pulses Lungs: CTA, normal effort, regular rate Abdomen:  Soft, flat, non tender with active bowel sounds Musculoskeletal:  5/5 in all extremities Skin:  Warm and dry, no rashes/ulcerations/lesions present, non tenting  Recent Labs  Lab 08/07/18 1554  NA 131*  K 5.2*  CL 92*  CO2 11*  BUN 79*  CREATININE 2.49*  GLUCOSE 954*   Recent Labs  Lab 08/07/18 1554  HGB 11.7*  HCT 36.1*  WBC 9.0  PLT 296   Dg Chest Portable 1 View  Result Date: 08/07/2018 CLINICAL DATA:  Lack of appetite, generally not feeling well since Saturday, dehydration, hyperglycemia, history hypertension, diabetes mellitus, CHF, smoker, cardiomyopathy EXAM: PORTABLE CHEST 1 VIEW COMPARISON:  Portable exam 1603 hours compared to 03/22/2018 FINDINGS: Normal heart size, mediastinal contours, and pulmonary vascularity. Bronchitic changes with minimal biapical scarring. Lungs otherwise clear. No infiltrate, pleural effusion or pneumothorax. No acute osseous findings. IMPRESSION: Bronchitic changes and minimal biapical scarring. No acute abnormalities. Electronically Signed   By: Lavonia Dana M.D.   On: 08/07/2018 16:11    ASSESSMENT / PLAN:  Diabetic Ketoacidosis secondary to poorly controlled diabetes mellitus Hx: DM transition off Insulin drip per DKA Gluco stabilizer protocol - Q 1 hour CBG - monitor BMP Q  4 h, replace electrolytes PRN - IVF resuscitation via DKA protocol - A1C pending - Diabetes Coordinator consulted, appreciate input  Acute Renal Failure secondary to hypovolemia & metabolic acidosis in setting of DKA Hx: CKD Stage II - Q 4 BMP - Strict I&O's - Hold home lasix dose  - avoid nephrotoxic agents  Elevated Troponin secondary to suspected demand ischemia Hypertension Hx: HTN, Combined CHF, NICM - Trend Troponin - Continuous cardiac monitoring - Continue home dose carvedilol - Added hydralazine PRN for SBP > 160 - Home  dose of Entresto ordered to start 8/4 - Recommend restarting home doses of: spironolactone & hydralazine once DKA is resolved   DISPOSITION: Stepdown Goals of Care: FULL CODE VTE Prophylaxis: Heparin SQ Stress Ulcer Prophylaxis: Protonix     Corrin Parker, M.D.  [facility]  Medical Director [place] Director [facility] Cardio-Pulmonary Department

## 2018-08-07 NOTE — ED Notes (Signed)
Attempted to call report

## 2018-08-07 NOTE — ED Notes (Signed)
MD made aware of critical pH  And of anion gap of 28

## 2018-08-07 NOTE — ED Triage Notes (Signed)
PT to ED via EMS from home. Called out d/t lack of appetite and generally not feeling well since Saturday, complains also of dehydration. EMS cbg reads high, hx of CHF and DM

## 2018-08-07 NOTE — ED Notes (Signed)
ED TO INPATIENT HANDOFF REPORT  ED Nurse Name and Phone #: Yomira Flitton [phone]  S Name/Age/Gender Shawn [name] 63 y.o. male Room/Bed: ED09A/ED09A  Code Status   Code Status: Prior  Home/SNF/Other Home Patient oriented to: self, place, time and situation Is this baseline? Yes   Triage Complete: Triage complete  Chief Complaint hyperglycemia  Triage Note PT to ED via EMS from home. Called out d/t lack of appetite and generally not feeling well since Saturday, complains also of dehydration. EMS cbg reads high, hx of CHF and DM   Allergies No Known Allergies  Level of Care/Admitting Diagnosis ED Disposition    ED Disposition Condition [place]: [place] [100120]  Level of Care: ICU [6]  Covid Evaluation: Confirmed COVID Negative  Diagnosis: DKA, type 2 ([place]) [628638]  Admitting Physician: Odessa Fleming  Attending Physician: Odessa Fleming  Estimated length of stay: past midnight tomorrow  Certification:: I certify this patient will need inpatient services for at least 2 midnights  PT Class (Do Not Modify): Inpatient [101]  PT Acc Code (Do Not Modify): Private [1]       B Medical/Surgery History Past Medical History:  Diagnosis Date  . Chronic combined systolic (congestive) and diastolic (congestive) heart failure ([place])    a. TTE 6/15: EF < 20%, mildly dilated LV, DD, mildly dilated LA, mod dilated RA, mild MR, mild to mod TR, mod increased posterior wall thickness, elevated LA and LVEDP; b. 4/12019 Echo: EF 20-25%, diff HK. Gr1 DD, nl RV fxn.  . CKD (chronic kidney disease), stage II   . Diabetes mellitus with complication ([place])    a. Prior admissions w/ DKA (last 12/2017).  . Essential hypertension   . NICM (nonischemic cardiomyopathy) ([place])    a. 06/2013 Echo: EF<20%; b. 06/2013 Cath: no signif dzs; c. 04/2017 Echo: 20-25%, gr1 DD.  Marland Kitchen Polysubstance abuse ([place])    a. etoh and tobacco  . Stroke [facility])    Past  Surgical History:  Procedure Laterality Date  . NO PAST SURGERIES       A IV Location/Drains/Wounds Patient Lines/Drains/Airways Status   Active Line/Drains/Airways    Name:   Placement date:   Placement time:   Site:   Days:   Peripheral IV 08/07/18 Right Antecubital   08/07/18    1552    Antecubital   less than 1   External Urinary Catheter   03/23/18    1315    -   137          Intake/Output Last 24 hours No intake or output data in the 24 hours ending 08/07/18 1925  Labs/Imaging Results for orders placed or performed during the hospital encounter of 08/07/18 (from the past 48 hour(s))  CBC     Status: Abnormal   Collection Time: 08/07/18  3:54 PM  Result Value Ref Range   WBC 9.0 4.0 - 10.5 K/uL   RBC 3.93 (L) 4.22 - 5.81 MIL/uL   Hemoglobin 11.7 (L) 13.0 - 17.0 g/dL   HCT 36.1 (L) 39.0 - 52.0 %   MCV 91.9 80.0 - 100.0 fL   MCH 29.8 26.0 - 34.0 pg   MCHC 32.4 30.0 - 36.0 g/dL   RDW 14.2 11.5 - 15.5 %   Platelets 296 150 - 400 K/uL   nRBC 0.0 0.0 - 0.2 %    Comment: Performed at [facility], [address], [place],  [address]  Comprehensive metabolic  panel     Status: Abnormal   Collection Time: 08/07/18  3:54 PM  Result Value Ref Range   Sodium 131 (L) 135 - 145 mmol/L    Comment: ELECTROLYTES REPEATED.[facility]   Potassium 5.2 (H) 3.5 - 5.1 mmol/L   Chloride 92 (L) 98 - 111 mmol/L   CO2 11 (L) 22 - 32 mmol/L   Glucose, Bld 954 (HH) 70 - 99 mg/dL    Comment: CRITICAL RESULT CALLED TO, READ BACK BY AND VERIFIED WITH Sonika Levins AT 1642 ON 08/07/2018 [place].    BUN 79 (H) 8 - 23 mg/dL   Creatinine, Ser 2.49 (H) 0.61 - 1.24 mg/dL   Calcium 8.4 (L) 8.9 - 10.3 mg/dL   Total Protein 6.0 (L) 6.5 - 8.1 g/dL   Albumin 3.0 (L) 3.5 - 5.0 g/dL   AST 24 15 - 41 U/L   ALT 18 0 - 44 U/L   Alkaline Phosphatase 110 38 - 126 U/L   Total Bilirubin 1.9 (H) 0.3 - 1.2 mg/dL   GFR calc non Af Amer 27 (L) >60 mL/min   GFR calc Af Amer 31 (L) >60 mL/min   Anion gap  28 (H) 5 - 15    Comment: Performed at [facility], [place], [place] [address]  Troponin I (High Sensitivity)     Status: Abnormal   Collection Time: 08/07/18  3:54 PM  Result Value Ref Range   Troponin I (High Sensitivity) 21 (H) <18 ng/L    Comment: (NOTE) Elevated high sensitivity troponin I (hsTnI) values and significant  changes across serial measurements may suggest ACS but many other  chronic and acute conditions are known to elevate hsTnI results.  Refer to the "Links" section for chest pain algorithms and additional  guidance. Performed at [facility], [address] [place], [place] [address]   Blood gas, venous     Status: Abnormal   Collection Time: 08/07/18  3:54 PM  Result Value Ref Range   pH, Ven 7.19 (LL) 7.250 - 7.430    Comment: CRITICAL RESULT CALLED TO, READ BACK BY AND VERIFIED WITH: CRITICAL VALUE 08/07/18,1605,DR.[email or web address]/FD    pCO2, Ven 25 (L) 44.0 - 60.0 mmHg   pO2, Ven 66.0 (H) 32.0 - 45.0 mmHg   Bicarbonate 9.5 (L) 20.0 - 28.0 mmol/L   Acid-base deficit 17.0 (H) 0.0 - 2.0 mmol/L   O2 Saturation 86.8 %   Patient temperature 37.0    Collection site VEIN    Sample type VEIN     Comment: Performed at [facility], [address] [place], [place] [address]  Urinalysis, Complete w Microscopic     Status: Abnormal   Collection Time: 08/07/18  3:54 PM  Result Value Ref Range   Color, Urine STRAW (A) YELLOW   APPearance CLEAR (A) CLEAR   Specific Gravity, Urine 1.021 1.005 - 1.030   pH 5.0 5.0 - 8.0   Glucose, UA >=500 (A) NEGATIVE mg/dL   Hgb urine dipstick SMALL (A) NEGATIVE   Bilirubin Urine NEGATIVE NEGATIVE   Ketones, ur 20 (A) NEGATIVE mg/dL   Protein, ur 100 (A) NEGATIVE mg/dL   Nitrite NEGATIVE NEGATIVE   Leukocytes,Ua NEGATIVE NEGATIVE   RBC / HPF 0-5 0 - 5 RBC/hpf   WBC, UA 0-5 0 - 5 WBC/hpf   Bacteria, UA NONE SEEN NONE SEEN   Squamous Epithelial / LPF NONE SEEN 0 - 5    Comment:  Performed at [facility], [address] [place], [place] [address]  Glucose, capillary     Status: Abnormal   Collection Time: 08/07/18  5:36 PM  Result Value Ref Range   Glucose-Capillary >600 (HH) 70 - 99 mg/dL  SARS Coronavirus 2 [facility] order, Performed in [place] hospital lab)     Status: None   Collection Time: 08/07/18  5:41 PM  Result Value Ref Range   SARS Coronavirus 2 NEGATIVE NEGATIVE    Comment: (NOTE) If result is NEGATIVE SARS-CoV-2 target nucleic acids are NOT DETECTED. The SARS-CoV-2 RNA is generally detectable in upper and lower  respiratory specimens during the acute phase of infection. The lowest  concentration of SARS-CoV-2 viral copies this assay can detect is 250  copies / mL. A negative result does not preclude SARS-CoV-2 infection  and should not be used as the sole basis for treatment or other  patient management decisions.  A negative result may occur with  improper specimen collection / handling, submission of specimen other  than nasopharyngeal swab, presence of viral mutation(s) within the  areas targeted by this assay, and inadequate number of viral copies  (<250 copies / mL). A negative result must be combined with clinical  observations, patient history, and epidemiological information. If result is POSITIVE SARS-CoV-2 target nucleic acids are DETECTED. The SARS-CoV-2 RNA is generally detectable in upper and lower  respiratory specimens dur ing the acute phase of infection.  Positive  results are indicative of active infection with SARS-CoV-2.  Clinical  correlation with patient history and other diagnostic information is  necessary to determine patient infection status.  Positive results do  not rule out bacterial infection or co-infection with other viruses. If result is PRESUMPTIVE POSTIVE SARS-CoV-2 nucleic acids MAY BE PRESENT.   A presumptive positive result was obtained on the submitted specimen  and confirmed on repeat  testing.  While 2019 novel coronavirus  (SARS-CoV-2) nucleic acids may be present in the submitted sample  additional confirmatory testing may be necessary for epidemiological  and / or clinical management purposes  to differentiate between  SARS-CoV-2 and other Sarbecovirus currently known to infect humans.  If clinically indicated additional testing with an alternate test  methodology [phone]) is advised. The SARS-CoV-2 RNA is generally  detectable in upper and lower respiratory sp ecimens during the acute  phase of infection. The expected result is Negative. Fact Sheet for Patients:  StrictlyIdeas.no Fact Sheet for Healthcare Providers: BankingDealers.co.za This test is not yet approved or cleared by the [place] FDA and has been authorized for detection and/or diagnosis of SARS-CoV-2 by FDA under an Emergency Use Authorization (EUA).  This EUA will remain in effect (meaning this test can be used) for the duration of the COVID-19 declaration under Section 564(b)(1) of the Act, 21 U.S.C. section 360bbb-3(b)(1), unless the authorization is terminated or revoked sooner. Performed at [facility], [address] [place], [place] [address]   Glucose, capillary     Status: Abnormal   Collection Time: 08/07/18  6:55 PM  Result Value Ref Range   Glucose-Capillary >600 (HH) 70 - 99 mg/dL   Dg Chest Portable 1 View  Result Date: 08/07/2018 CLINICAL DATA:  Lack of appetite, generally not feeling well since Saturday, dehydration, hyperglycemia, history hypertension, diabetes mellitus, CHF, smoker, cardiomyopathy EXAM: PORTABLE CHEST 1 VIEW COMPARISON:  Portable exam 1603 hours compared to 03/22/2018 FINDINGS: Normal heart size, mediastinal contours, and pulmonary vascularity. Bronchitic changes with minimal biapical scarring. Lungs otherwise clear. No infiltrate, pleural effusion or pneumothorax. No  acute osseous findings.  IMPRESSION: Bronchitic changes and minimal biapical scarring. No acute abnormalities. Electronically Signed   By: Lavonia Dana M.D.   On: 08/07/2018 16:11    Pending Labs [employer] (From admission, onward)    Start     Ordered   Signed and Held  CBC  (heparin)  Once,   R    Comments: Baseline for heparin therapy IF NOT ALREADY DRAWN.  Notify MD if PLT < 100 K.    Signed and Held   Signed and Held  Creatinine, serum  (heparin)  Once,   R    Comments: Baseline for heparin therapy IF NOT ALREADY DRAWN.    Signed and Held   Signed and Held  Basic metabolic panel  Now then every 6 hours,   R     Signed and Held   Signed and Held  Hemoglobin A1c  Add-on,   R     Signed and Held          Vitals/Pain Today's Vitals   08/07/18 1541 08/07/18 1542 08/07/18 1700 08/07/18 1730  BP: (!) 147/75  (!) 148/72 (!) 145/74  Pulse: 71  74 72  Resp: 20  13 15   Temp: (!) 97.5 F (36.4 C)     SpO2: 99%  97% 96%  PainSc:  0-No pain      Isolation Precautions No active isolations  Medications Medications  insulin regular, human (MYXREDLIN) 100 units/ 100 mL infusion (10.8 Units/hr Intravenous Rate/Dose Change 08/07/18 1856)  sodium chloride 0.9 % bolus 1,000 mL (1,000 mLs Intravenous New Bag/Given 08/07/18 1557)    Mobility walks Low fall risk   Focused Assessments Cardiac Assessment Handoff:    Lab Results  Component Value Date   CKTOTAL 407 (H) 06/17/2013   CKMB 6.8 (H) 06/17/2013   TROPONINI <0.03 01/06/2017   No results found for: DDIMER Does the Patient currently have chest pain? No     R Recommendations: See Admitting Provider Note  Report given to:   Additional Notes:

## 2018-08-07 NOTE — ED Notes (Signed)
Pt given cup of water. MD approved

## 2018-08-07 NOTE — ED Provider Notes (Signed)
[facility] Emergency Department Provider Note  Time seen: 3:44 PM  I have reviewed the triage vital signs and the nursing notes.   HISTORY  Chief Complaint Hyperglycemia   HPI Shawn [name] is a 63 y.o. male with a past medical history of CHF, CKD, diabetes, alcohol abuse, prior CVA, presents to the emergency department for dehydration, weakness found to be hyperglycemic.  According to the patient for the past several days he has felt very weak has not been eating or drinking much feels like he was dehydrated.  Wife called EMS for the patient due to generalized weakness.  EMS found the patient be hyperglycemic with a CBG reading "high."  Patient states he thinks he took insulin one time today.  Denies any known fevers.  Denies any cough.  No vomiting or diarrhea.  Does state very slight dysuria.   Past Medical History:  Diagnosis Date  . Chronic combined systolic (congestive) and diastolic (congestive) heart failure ([place])    a. TTE 6/15: EF < 20%, mildly dilated LV, DD, mildly dilated LA, mod dilated RA, mild MR, mild to mod TR, mod increased posterior wall thickness, elevated LA and LVEDP; b. 4/12019 Echo: EF 20-25%, diff HK. Gr1 DD, nl RV fxn.  . CKD (chronic kidney disease), stage II   . Diabetes mellitus with complication ([place])    a. Prior admissions w/ DKA (last 12/2017).  . Essential hypertension   . NICM (nonischemic cardiomyopathy) ([place])    a. 06/2013 Echo: EF<20%; b. 06/2013 Cath: no signif dzs; c. 04/2017 Echo: 20-25%, gr1 DD.  Marland Kitchen Polysubstance abuse ([place])    a. etoh and tobacco  . Stroke [facility])     Patient Active Problem List   Diagnosis Date Noted  . Essential hypertension 05/12/2018  . Chronic systolic CHF (congestive heart failure) ([place]) 12/15/2017  . NICM (nonischemic cardiomyopathy) ([place]) 05/13/2017  . Type 2 diabetes mellitus with complication, with long-term current use of insulin ([place]) 05/13/2017  . Smoker 05/13/2017  . Polysubstance  abuse ([place]) 05/13/2017  . Alcohol abuse 05/13/2017  . Diabetic ketoacidosis without coma associated with type 2 diabetes mellitus ([place]) 04/27/2017    Past Surgical History:  Procedure Laterality Date  . NO PAST SURGERIES      Prior to Admission medications   Medication Sig Start Date End Date Taking? Authorizing Provider  aspirin 81 MG chewable tablet Chew 1 tablet (81 mg total) by mouth daily. 04/30/17   Demetrios Loll, MD  atorvastatin (LIPITOR) 40 MG tablet Take 1 tablet (40 mg total) by mouth at bedtime. 03/24/18   Salary, Avel Peace, MD  baclofen (LIORESAL) 10 MG tablet Take 10 mg by mouth 2 (two) times daily as needed for muscle spasms. Muscle spasm    [provider]  carvedilol (COREG) 12.5 MG tablet TAKE 1 TABLET BY MOUTH TWICE A DAY WITH A MEAL 07/19/18   Gollan, Kathlene November, MD  cetirizine (ZYRTEC) 10 MG tablet TAKE 1 TABLET BY MOUTH DAILY 07/26/18   Minna Merritts, MD  furosemide (LASIX) 40 MG tablet Take 2 tablets (80 mg total) by mouth every morning AND 1 tablet (40 mg total) every evening. 05/03/18   Minna Merritts, MD  hydrALAZINE (APRESOLINE) 100 MG tablet Take 100 mg by mouth 2 (two) times daily.    [provider]  insulin aspart (NOVOLOG) 100 UNIT/ML injection Inject 5 Units into the skin 3 (three) times daily with meals.    [provider]  insulin glargine (LANTUS) 100  UNIT/ML injection Inject 15 Units into the skin at bedtime.    [provider]  metolazone (ZAROXOLYN) 5 MG tablet Take 1 tablet (5 mg) by mouth x 1 dose, 30 minutes prior to morning lasix on days your weight is 150 lbs or above 05/12/18   Minna Merritts, MD  Multiple Vitamin (MULTIVITAMIN WITH MINERALS) TABS tablet Take 1 tablet by mouth daily. 04/30/17   Demetrios Loll, MD  nicotine (NICODERM CQ - DOSED IN MG/24 HOURS) 14 mg/24hr patch Place 1 patch (14 mg total) onto the skin daily. 03/25/18   Salary, Avel Peace, MD  potassium chloride (K-DUR) 10 MEQ tablet Take 1 tablet (10 meq)  by mouth once daily except on days you take metolazone, take 2 tablets (20 meq) by mouth x 1 dose 06/29/18   Gollan, Kathlene November, MD  sacubitril-valsartan (ENTRESTO) 97-103 MG Take 1 tablet by mouth 2 (two) times daily. 06/29/18   Minna Merritts, MD  spironolactone (ALDACTONE) 25 MG tablet Take 25 mg by mouth at bedtime.    [provider]  venlafaxine (EFFEXOR) 37.5 MG tablet Take 37.5 mg by mouth daily.    [provider]    No Known Allergies  Family History  Problem Relation Age of Onset  . Congestive Heart Failure Mother   . Diabetes Mother   . Congestive Heart Failure Brother     Social History Social History   Tobacco Use  . Smoking status: Current Every Day Smoker    Packs/day: 0.50    Types: Cigarettes  . Smokeless tobacco: Never Used  Substance Use Topics  . Alcohol use: Not Currently    Frequency: Never  . Drug use: Not on file    Review of Systems Constitutional: Negative for fever. Cardiovascular: Negative for chest pain. Respiratory: Negative for shortness of breath.  Negative for cough. Gastrointestinal: Negative for abdominal pain, vomiting and diarrhea. Genitourinary: Slight dysuria Musculoskeletal: Negative for musculoskeletal complaints Neurological: Negative for headache All other ROS negative  ____________________________________________   PHYSICAL EXAM:  VITAL SIGNS: ED Triage Vitals  Enc Vitals Group     BP 08/07/18 1541 (!) 147/75     Pulse Rate 08/07/18 1541 71     Resp 08/07/18 1541 20     Temp 08/07/18 1541 (!) 97.5 F (36.4 C)     Temp src --      SpO2 08/07/18 1541 99 %     Weight --      Height --      Head Circumference --      Peak Flow --      Pain Score 08/07/18 1542 0     Pain Loc --      Pain Edu? --      Excl. in [place]? --    Constitutional: Alert and oriented. Well appearing and in no distress. Eyes: Normal exam ENT      Head: Normocephalic and atraumatic      Mouth/Throat: Mucous membranes are  moist. Cardiovascular: Normal rate, regular rhythm. Respiratory: Normal respiratory effort without tachypnea nor retractions. Breath sounds are clear Gastrointestinal: Soft and nontender. No distention.   Musculoskeletal: Nontender with normal range of motion in all extremities. Neurologic:  Normal speech and language. No gross focal neurologic deficits  Skin:  Skin is warm, dry and intact.  Psychiatric: Mood and affect are normal.   ____________________________________________    EKG  EKG viewed and interpreted by myself shows a normal sinus rhythm at 70 bpm with a narrow QRS,  normal axis, normal intervals, no concerning ST changes.  ____________________________________________    RADIOLOGY  Chest x-ray is negative.  ____________________________________________   INITIAL IMPRESSION / ASSESSMENT AND PLAN / ED COURSE  Pertinent labs & imaging results that were available during my care of the patient were reviewed by me and considered in my medical decision making (see chart for details).   Patient presents emergency department with generalized fatigue/weakness concerns for dehydration, found to be hyperglycemic.  Differential would include hyperglycemia, dehydration, DKA, infectious etiology, metabolic or electrolyte abnormality.  We will check labs, IV hydrate, check a VBG, as well as EKG.  Patient agreeable to plan of care.  Patient likely in DKA with pH 7.19 with a blood glucose of 954 and anion gap of 28.  Patient receiving IV fluids.  We will start on insulin infusion.  Patient will require admission to the hospital service for further treatment and work-up.  JAMEEL QUANT was evaluated in Emergency Department on 08/07/2018 for the symptoms described in the history of present illness. He was evaluated in the context of the global COVID-19 pandemic, which necessitated consideration that the patient might be at risk for infection with the SARS-CoV-2 virus that causes COVID-19.  Institutional protocols and algorithms that pertain to the evaluation of patients at risk for COVID-19 are in a state of rapid change based on information released by regulatory bodies including the CDC and federal and state organizations. These policies and algorithms were followed during the patient's care in the ED.  CRITICAL CARE Performed by: Harvest Dark   Total critical care time: 30 minutes  Critical care time was exclusive of separately billable procedures and treating other patients.  Critical care was necessary to treat or prevent imminent or life-threatening deterioration.  Critical care was time spent personally by me on the following activities: development of treatment plan with patient and/or surrogate as well as nursing, discussions with consultants, evaluation of patient's response to treatment, examination of patient, obtaining history from patient or surrogate, ordering and performing treatments and interventions, ordering and review of laboratory studies, ordering and review of radiographic studies, pulse oximetry and re-evaluation of patient's condition.   ____________________________________________   FINAL CLINICAL IMPRESSION(S) / ED DIAGNOSES  Weakness Hyperglycemia Diabetic ketoacidosis   Harvest Dark, MD 08/07/18 [phone]

## 2018-08-07 NOTE — H&P (Signed)
[facility] [place] NAME: Shawn [name]    MR#:  [id number]  DATE OF BIRTH:  May 19, 1955  DATE OF ADMISSION:  08/07/2018  PRIMARY CARE PHYSICIAN: [facility] [place] [facility]   REQUESTING/REFERRING PHYSICIAN:  Dr Shawn [name] CHIEF COMPLAINT:  feeling weak and missed several doses of insulin patient is now in DKA and renal failure  HISTORY OF PRESENT ILLNESS:  Shawn [name]  is a 63 y.o. male with a known history of non-ischemic cardiomyopathy with EF of less than 20% by Echo May 2020, hypertension, CKD stage II with baseline creatinine 1.6, prior history of alcohol abuse comes to the emergency room with generalized weakness. Patient has missed his insulin. He is found to be in DKA. Receiving IV fluids and insulin drip to be started  Patient was found to have glucose of 952. His creatinine is 2.46. With anion gap of 28. Patient has been admitted with DKA type II along with hyperkalemia and acute on chronic renal failure stage III.  No family in the ER  PAST MEDICAL HISTORY:   Past Medical History:  Diagnosis Date  . Chronic combined systolic (congestive) and diastolic (congestive) heart failure ([place])    a. TTE 6/15: EF < 20%, mildly dilated LV, DD, mildly dilated LA, mod dilated RA, mild MR, mild to mod TR, mod increased posterior wall thickness, elevated LA and LVEDP; b. 4/12019 Echo: EF 20-25%, diff HK. Gr1 DD, nl RV fxn.  . CKD (chronic kidney disease), stage II   . Diabetes mellitus with complication ([place])    a. Prior admissions w/ DKA (last 12/2017).  . Essential hypertension   . NICM (nonischemic cardiomyopathy) ([place])    a. 06/2013 Echo: EF<20%; b. 06/2013 Cath: no signif dzs; c. 04/2017 Echo: 20-25%, gr1 DD.  Marland Kitchen Polysubstance abuse ([place])    a. etoh and tobacco  . Stroke ([place])     PAST SURGICAL HISTOIRY:   Past Surgical History:  Procedure Laterality Date  . NO PAST SURGERIES      SOCIAL HISTORY:   Social History    Tobacco Use  . Smoking status: Current Every Day Smoker    Packs/day: 0.50    Types: Cigarettes  . Smokeless tobacco: Never Used  Substance Use Topics  . Alcohol use: Not Currently    Frequency: Never    FAMILY HISTORY:   Family History  Problem Relation Age of Onset  . Congestive Heart Failure Mother   . Diabetes Mother   . Congestive Heart Failure Brother     DRUG ALLERGIES:  No Known Allergies  REVIEW OF SYSTEMS:  Review of Systems  Constitutional: Positive for malaise/fatigue. Negative for chills, fever and weight loss.  HENT: Negative for ear discharge, ear pain and nosebleeds.   Eyes: Negative for blurred vision, pain and discharge.  Respiratory: Positive for shortness of breath. Negative for sputum production, wheezing and stridor.   Cardiovascular: Negative for chest pain, palpitations, orthopnea and PND.  Gastrointestinal: Negative for abdominal pain, diarrhea, nausea and vomiting.  Genitourinary: Negative for frequency and urgency.  Musculoskeletal: Positive for back pain. Negative for joint pain.  Neurological: Positive for focal weakness. Negative for sensory change, speech change and weakness.  Psychiatric/Behavioral: Negative for depression and hallucinations. The patient is not nervous/anxious.      MEDICATIONS AT HOME:   Prior to Admission medications   Medication Sig Start Date End Date Taking? Authorizing Provider  aspirin 81 MG chewable tablet Chew 1 tablet (81 mg total) by  mouth daily. 04/30/17  Yes Demetrios Loll, MD  atorvastatin (LIPITOR) 40 MG tablet Take 1 tablet (40 mg total) by mouth at bedtime. Patient taking differently: Take 20 mg by mouth at bedtime.  03/24/18  Yes Salary, Avel Peace, MD  B-Complex-C TABS Take 1 tablet by mouth daily.   Yes [provider]  baclofen (LIORESAL) 10 MG tablet Take 10 mg by mouth 2 (two) times daily as needed for muscle spasms. Muscle spasm   Yes [provider]  carvedilol (COREG) 12.5 MG tablet  TAKE 1 TABLET BY MOUTH TWICE A DAY WITH A MEAL 07/19/18  Yes Gollan, Kathlene November, MD  furosemide (LASIX) 40 MG tablet Take 40 mg by mouth daily.   Yes [provider]  hydrALAZINE (APRESOLINE) 100 MG tablet Take 100 mg by mouth 3 (three) times daily.    Yes [provider]  insulin aspart (NOVOLOG) 100 UNIT/ML injection Inject 5 Units into the skin 3 (three) times daily with meals.   Yes [provider]  insulin glargine (LANTUS) 100 UNIT/ML injection Inject 15 Units into the skin at bedtime.   Yes [provider]  metolazone (ZAROXOLYN) 5 MG tablet Take 1 tablet (5 mg) by mouth x 1 dose, 30 minutes prior to morning lasix on days your weight is 150 lbs or above Patient taking differently: Take 1 tablet (5 mg) by mouth x 1 dose, 30 minutes prior to morning lasix on days your weight is 150 lbs or above as neeeded 05/12/18  Yes Gollan, Kathlene November, MD  potassium chloride SA (K-DUR) 20 MEQ tablet Take 20 mEq by mouth daily.   Yes [provider]  sacubitril-valsartan (ENTRESTO) 97-103 MG Take 1 tablet by mouth 2 (two) times daily. 06/29/18  Yes Minna Merritts, MD  spironolactone (ALDACTONE) 25 MG tablet Take 25 mg by mouth at bedtime.    [provider]      VITAL SIGNS:  Blood pressure (!) 147/75, pulse 71, temperature (!) 97.5 F (36.4 C), resp. rate 20, SpO2 99 %.  PHYSICAL EXAMINATION:  GENERAL:  63 y.o.-year-old patient lying in the bed with no acute distress. Weak critically ill EYES: Pupils equal, round, reactive to light and accommodation. No scleral icterus. Extraocular muscles intact.  HEENT: Head atraumatic, normocephalic. Oropharynx and nasopharynx clear.  NECK:  Supple, no jugular venous distention. No thyroid enlargement, no tenderness.  LUNGS: Normal breath sounds bilaterally, no wheezing, rales,rhonchi or crepitation. No use of accessory muscles of respiration.  CARDIOVASCULAR: S1, S2 normal. No murmurs, rubs, or gallops.  Tachycardia ABDOMEN: Soft, nontender, nondistended. Bowel sounds present. No organomegaly or mass.  EXTREMITIES: No pedal edema, cyanosis, or clubbing.  NEUROLOGIC: Cranial nerves II through XII are intact. Muscle strength 5/5 in all extremities. Sensation intact. Gait not checked. Generalized weakness PSYCHIATRIC: The patient is alert and oriented x 3.  SKIN: No obvious rash, lesion, or ulcer.   LABORATORY PANEL:   CBC Recent Labs  Lab 08/07/18 1554  WBC 9.0  HGB 11.7*  HCT 36.1*  PLT 296   ------------------------------------------------------------------------------------------------------------------  Chemistries  Recent Labs  Lab 08/07/18 1554  NA 131*  K 5.2*  CL 92*  CO2 11*  GLUCOSE 954*  BUN 79*  CREATININE 2.49*  CALCIUM 8.4*  AST 24  ALT 18  ALKPHOS 110  BILITOT 1.9*   ------------------------------------------------------------------------------------------------------------------  Cardiac Enzymes No results for input(s): TROPONINI in the last 168 hours. ------------------------------------------------------------------------------------------------------------------  RADIOLOGY:  Dg Chest Portable 1 View  Result Date: 08/07/2018 CLINICAL DATA:  Lack of appetite, generally not feeling well since Saturday, dehydration, hyperglycemia, history hypertension, diabetes mellitus, CHF, smoker, cardiomyopathy EXAM: PORTABLE CHEST 1 VIEW COMPARISON:  Portable exam 1603 hours compared to 03/22/2018 FINDINGS: Normal heart size, mediastinal contours, and pulmonary vascularity. Bronchitic changes with minimal biapical scarring. Lungs otherwise clear. No infiltrate, pleural effusion or pneumothorax. No acute osseous findings. IMPRESSION: Bronchitic changes and minimal biapical scarring. No acute abnormalities. Electronically Signed   By: Lavonia Dana M.D.   On: 08/07/2018 16:11    EKG:    IMPRESSION AND PLAN:   Shawn [name]  is a 63 y.o. male with a known history  of non-ischemic cardiomyopathy with EF of less than 20% by Echo May 2020, hypertension, CKD stage II with baseline creatinine 1.6, prior history of alcohol abuse comes to the emergency room with generalized weakness. Patient has missed his insulin. He is found to be in DKA.  1. DKA and type II diabetes. Patient missed insulin since Saturday -admit to ICU -IV fluids and insulin drip -monitor metabolic panel according to DK protocol -replace electrolyte as required  2. Anion gap acidosis secondary to DKA -treatment as above  3. Acute renal failure on chronic kidney disease stage 2/3 -baseline creatinine or a 1.6 -creatinine today at 2.49 with mild elevated potassium -continue IV fluids cautiously given history of ischemic cardiomyopathy -hold Lasix, Metolazone, spironolactone, entresto  4. Hyperkalemia potassium is 5.2 -IV insulin drip and fluids. Check potassium and treat accordingly  5. Non ischemic cardiomyopathy -patient follows with Dr. Rockey Situ -EF of 20 to 25% by last April May 2020 -I'm holding his diuretics given renal failure -cautious IV fluids to avoid pulmonary edema  6. History of CVA in the past -continue aspirin  7. DVT prophylaxis subcu heparin  No family in the ER  All the records are reviewed and case discussed with ED provider.   CODE STATUS: full  TOTAL TIME TAKING CARE OF THIS PATIENT: *50* minutes.    Fritzi Mandes M.D on 08/07/2018 at 5:57 PM  Between 7am to 6pm - Pager - [phone]  After 6pm go to [email or web address] - password [email or web address] [facility]  [facility]  Office  [phone]  CC: Primary care physician; [facility] [place]

## 2018-08-07 NOTE — ED Notes (Signed)
PT bed adjusted and lights turned out

## 2018-08-07 NOTE — ED Notes (Signed)
Pt's wife updated.

## 2018-08-08 LAB — BASIC METABOLIC PANEL
Anion gap: 11 (ref 5–15)
Anion gap: 7 (ref 5–15)
BUN: 65 mg/dL — ABNORMAL HIGH (ref 8–23)
BUN: 60 mg/dL — ABNORMAL HIGH (ref 8–23)
CO2: 21 mmol/L — ABNORMAL LOW (ref 22–32)
CO2: 28 mmol/L (ref 22–32)
Calcium: 8.3 mg/dL — ABNORMAL LOW (ref 8.9–10.3)
Calcium: 8.4 mg/dL — ABNORMAL LOW (ref 8.9–10.3)
Chloride: 104 mmol/L (ref 98–111)
Chloride: 106 mmol/L (ref 98–111)
Creatinine, Ser: 1.74 mg/dL — ABNORMAL HIGH (ref 0.61–1.24)
Creatinine, Ser: 1.41 mg/dL — ABNORMAL HIGH (ref 0.61–1.24)
GFR calc Af Amer: 48 mL/min — ABNORMAL LOW (ref >60)
GFR calc Af Amer: >60 mL/min (ref >60)
GFR calc non Af Amer: 41 mL/min — ABNORMAL LOW (ref >60)
GFR calc non Af Amer: 53 mL/min — ABNORMAL LOW (ref >60)
Glucose, Bld: 425 mg/dL — ABNORMAL HIGH (ref 70–99)
Glucose, Bld: 173 mg/dL — ABNORMAL HIGH (ref 70–99)
Potassium: 3.8 mmol/L (ref 3.5–5.1)
Potassium: 3.5 mmol/L (ref 3.5–5.1)
Sodium: 136 mmol/L (ref 135–145)
Sodium: 141 mmol/L (ref 135–145)

## 2018-08-08 LAB — GLUCOSE, CAPILLARY
Glucose-Capillary: 412 mg/dL — ABNORMAL HIGH (ref 70–99)
Glucose-Capillary: 346 mg/dL — ABNORMAL HIGH (ref 70–99)
Glucose-Capillary: 258 mg/dL — ABNORMAL HIGH (ref 70–99)
Glucose-Capillary: 224 mg/dL — ABNORMAL HIGH (ref 70–99)
Glucose-Capillary: 162 mg/dL — ABNORMAL HIGH (ref 70–99)
Glucose-Capillary: 128 mg/dL — ABNORMAL HIGH (ref 70–99)
Glucose-Capillary: 91 mg/dL (ref 70–99)
Glucose-Capillary: 444 mg/dL — ABNORMAL HIGH (ref 70–99)
Glucose-Capillary: 127 mg/dL — ABNORMAL HIGH (ref 70–99)
Glucose-Capillary: 67 mg/dL — ABNORMAL LOW (ref 70–99)
Glucose-Capillary: 81 mg/dL (ref 70–99)
Glucose-Capillary: 91 mg/dL (ref 70–99)

## 2018-08-08 LAB — CBC
HCT: 31.8 % — ABNORMAL LOW (ref 39.0–52.0)
Hemoglobin: 11.1 g/dL — ABNORMAL LOW (ref 13.0–17.0)
MCH: 29.2 pg (ref 26.0–34.0)
MCHC: 34.9 g/dL (ref 30.0–36.0)
MCV: 83.7 fL (ref 80.0–100.0)
Platelets: 268 10*3/uL (ref 150–400)
RBC: 3.8 MIL/uL — ABNORMAL LOW (ref 4.22–5.81)
RDW: 13.8 % (ref 11.5–15.5)
WBC: 9.6 10*3/uL (ref 4.0–10.5)
nRBC: 0 % (ref 0.0–0.2)

## 2018-08-08 LAB — HEMOGLOBIN A1C
Hgb A1c MFr Bld: 13.5 % — ABNORMAL HIGH (ref 4.8–5.6)
Mean Plasma Glucose: 341 mg/dL

## 2018-08-08 LAB — TROPONIN I (HIGH SENSITIVITY): Troponin I (High Sensitivity): 24 ng/L — ABNORMAL HIGH (ref <18)

## 2018-08-08 LAB — MAGNESIUM: Magnesium: 2.6 mg/dL — ABNORMAL HIGH (ref 1.7–2.4)

## 2018-08-08 LAB — PHOSPHORUS: Phosphorus: 2.9 mg/dL (ref 2.5–4.6)

## 2018-08-08 LAB — ETHANOL: Alcohol, Ethyl (B): <10 mg/dL (ref <10)

## 2018-08-08 LAB — MRSA PCR SCREENING: MRSA by PCR: NEGATIVE

## 2018-08-08 MED ORDER — SODIUM CHLORIDE 0.9 % IV SOLN
INTRAVENOUS | Status: DC
  Administered 2018-08-08: 01:00:00 via INTRAVENOUS

## 2018-08-08 MED ORDER — ATORVASTATIN CALCIUM 20 MG PO TABS
20.0000 mg | ORAL_TABLET | Freq: Every day | ORAL | Status: DC
  Administered 2018-08-08: 20 mg via ORAL
  Filled 2018-08-08: qty 1

## 2018-08-08 MED ORDER — INSULIN ASPART 100 UNIT/ML ~~LOC~~ SOLN
3.0000 [IU] | Freq: Three times a day (TID) | SUBCUTANEOUS | Status: DC
  Administered 2018-08-09 (×2): 3 [IU] via SUBCUTANEOUS
  Filled 2018-08-08 (×2): qty 1

## 2018-08-08 MED ORDER — CARVEDILOL 12.5 MG PO TABS
12.5000 mg | ORAL_TABLET | Freq: Two times a day (BID) | ORAL | Status: DC
  Administered 2018-08-08 – 2018-08-09 (×2): 12.5 mg via ORAL
  Filled 2018-08-08 (×3): qty 1

## 2018-08-08 MED ORDER — HEPARIN SODIUM (PORCINE) 5000 UNIT/ML IJ SOLN
5000.0000 [IU] | Freq: Three times a day (TID) | INTRAMUSCULAR | Status: DC
  Administered 2018-08-08 – 2018-08-09 (×3): 5000 [IU] via SUBCUTANEOUS
  Filled 2018-08-08 (×3): qty 1

## 2018-08-08 MED ORDER — SENNA 8.6 MG PO TABS
1.0000 | ORAL_TABLET | Freq: Two times a day (BID) | ORAL | Status: DC
  Administered 2018-08-08 – 2018-08-09 (×2): 8.6 mg via ORAL
  Filled 2018-08-08 (×3): qty 1

## 2018-08-08 MED ORDER — INSULIN ASPART 100 UNIT/ML ~~LOC~~ SOLN
0.0000 [IU] | Freq: Three times a day (TID) | SUBCUTANEOUS | Status: DC
  Administered 2018-08-08: 1 [IU] via SUBCUTANEOUS
  Administered 2018-08-09: 2 [IU] via SUBCUTANEOUS
  Administered 2018-08-09: 3 [IU] via SUBCUTANEOUS
  Filled 2018-08-08 (×3): qty 1

## 2018-08-08 MED ORDER — INSULIN GLARGINE 100 UNIT/ML ~~LOC~~ SOLN: 15.0000 [IU] | Freq: Every day | SUBCUTANEOUS | Status: DC

## 2018-08-08 MED ORDER — PANTOPRAZOLE SODIUM 40 MG IV SOLR
40.0000 mg | Freq: Every day | INTRAVENOUS | Status: DC
  Administered 2018-08-08 – 2018-08-09 (×2): 40 mg via INTRAVENOUS
  Filled 2018-08-08 (×2): qty 40

## 2018-08-08 MED ORDER — SODIUM CHLORIDE 0.9 % IV SOLN
INTRAVENOUS | Status: DC
  Administered 2018-08-08: 03:00:00 via INTRAVENOUS

## 2018-08-08 MED ORDER — INSULIN REGULAR HUMAN 100 UNIT/ML IJ SOLN: 3.0000 [IU] | Freq: Three times a day (TID) | INTRAMUSCULAR | Status: DC

## 2018-08-08 MED ORDER — FUROSEMIDE 40 MG PO TABS
40.0000 mg | ORAL_TABLET | Freq: Every day | ORAL | Status: DC
  Administered 2018-08-08 – 2018-08-09 (×2): 40 mg via ORAL
  Filled 2018-08-08: qty 2
  Filled 2018-08-08: qty 1

## 2018-08-08 MED ORDER — ONDANSETRON HCL 4 MG/2ML IJ SOLN: 4.0000 mg | Freq: Four times a day (QID) | INTRAMUSCULAR | Status: DC | PRN

## 2018-08-08 MED ORDER — INSULIN GLARGINE 100 UNIT/ML ~~LOC~~ SOLN
15.0000 [IU] | Freq: Every day | SUBCUTANEOUS | Status: DC
  Administered 2018-08-08: 15 [IU] via SUBCUTANEOUS
  Filled 2018-08-08 (×2): qty 0.15

## 2018-08-08 MED ORDER — ONDANSETRON HCL 4 MG PO TABS: 4.0000 mg | ORAL_TABLET | Freq: Four times a day (QID) | ORAL | Status: DC | PRN

## 2018-08-08 MED ORDER — ALBUTEROL SULFATE (2.5 MG/3ML) 0.083% IN NEBU: 2.5000 mg | INHALATION_SOLUTION | RESPIRATORY_TRACT | Status: DC | PRN

## 2018-08-08 MED ORDER — HYDRALAZINE HCL 20 MG/ML IJ SOLN: 10.0000 mg | INTRAMUSCULAR | Status: DC | PRN

## 2018-08-08 MED ORDER — ACETAMINOPHEN 325 MG PO TABS: 650.0000 mg | ORAL_TABLET | Freq: Four times a day (QID) | ORAL | Status: DC | PRN

## 2018-08-08 MED ORDER — INSULIN ASPART 100 UNIT/ML ~~LOC~~ SOLN: 0.0000 [IU] | Freq: Every day | SUBCUTANEOUS | Status: DC

## 2018-08-08 MED ORDER — ASPIRIN 81 MG PO CHEW
81.0000 mg | CHEWABLE_TABLET | Freq: Every day | ORAL | Status: DC
  Administered 2018-08-08 – 2018-08-09 (×2): 81 mg via ORAL
  Filled 2018-08-08 (×2): qty 1

## 2018-08-08 MED ORDER — NICOTINE 21 MG/24HR TD PT24
21.0000 mg | MEDICATED_PATCH | Freq: Every day | TRANSDERMAL | Status: DC
  Administered 2018-08-08 – 2018-08-09 (×2): 21 mg via TRANSDERMAL
  Filled 2018-08-08 (×2): qty 1

## 2018-08-08 MED ORDER — DEXTROSE-NACL 5-0.45 % IV SOLN
INTRAVENOUS | Status: DC
  Administered 2018-08-08: 06:00:00 via INTRAVENOUS

## 2018-08-08 MED ORDER — ACETAMINOPHEN 650 MG RE SUPP: 650.0000 mg | Freq: Four times a day (QID) | RECTAL | Status: DC | PRN

## 2018-08-08 MED ORDER — SACUBITRIL-VALSARTAN 97-103 MG PO TABS
1.0000 | ORAL_TABLET | Freq: Two times a day (BID) | ORAL | Status: DC
  Administered 2018-08-09: 1 via ORAL
  Filled 2018-08-08 (×3): qty 1

## 2018-08-08 NOTE — Progress Notes (Addendum)
Inpatient Diabetes Program Recommendations  AACE/ADA: New Consensus Statement on Inpatient Glycemic Control   Target Ranges:  Prepandial:   less than 140 mg/dL      Peak postprandial:   less than 180 mg/dL (1-2 hours)      Critically ill patients:  140 - 180 mg/dL  Results for Shawn [name], Shawn [name] (MRN [id number]) as of 08/08/2018 07:50  Ref. Range 08/07/2018 23:54 08/08/2018 01:41 08/08/2018 02:46 08/08/2018 03:52 08/08/2018 04:56 08/08/2018 06:03 08/08/2018 07:07  Glucose-Capillary Latest Ref Range: 70 - 99 mg/dL 513 (HH) 412 (H) 346 (H) 258 (H) 224 (H) 162 (H) 128 (H)   Results for Shawn [name], Shawn [name] (MRN [id number]) as of 08/08/2018 07:50  Ref. Range 08/07/2018 15:54  Glucose Latest Ref Range: 70 - 99 mg/dL [facility])   Results for Shawn [name], Shawn [name] (MRN [id number]) as of 08/08/2018 07:50  Ref. Range 12/16/2017 20:13  Hemoglobin A1C Latest Ref Range: 4.8 - 5.6 % 12.0 (H)   Review of Glycemic Control  Outpatient Diabetes medications: Lantus 15 units QHS, Novolog 5 units TID with meals Current orders for Inpatient glycemic control: Lantus 15 units daily, Novolog 0-9 units TID with meals, Novolog 0-5 units QHS  Inpatient Diabetes Program Recommendations:   Insulin - Meal Coverage: Please consider ordering Novolog 3 units TID with meals for meal coverage if patient eats at least 50% of meals.  HgbA1C: Current A1C in process. Last A1C in chart 12% on 12/16/2017.  NOTE: Noted consult for Diabetes Coordinator. Patient was inpatient 03/21/18 to 03/24/18 for DKA and was seen by Inpatient Diabetes Coordinator on 03/21/18. Patient admitted 08/07/18 again with DKA and started on IV insulin which has been transitioned to SQ insulin this morning; received Lantus 15 units at 7:37 am today. Will plan to follow up with patient today.  Addendum 08/08/18@13 :45-Spoke with patient about diabetes and home regimen for diabetes control. Patient reports being followed by [facility] for diabetes management and states that he sees an  Musician at the [place] in [place].  Patient states he is taking Levemir 13 units QHS and Novolog 5 units TID with meals as an outpatient for diabetes control. Patient reports taking DM medications as prescribed usually but notes that he felt so bad for the past 2 days he has not taken any insulin.  Explained to patient importance of taking Lantus even when he is not feeling good or not able to eat. Discussed DKA and how DKA occurs when insulin is not taken.  Patient reports that he checks his own glucose and administers his own insulin injections. Encouraged patient to allow his wife to help him with insulin injections and glucose monitoring if he is not feeling well enough to do it himself.  Patient reports checking glucose at home and states glucose is usually in the 180's mg/dl to low 200's mg/dl.  Discussed glucose and A1C goals. Discussed importance of checking CBGs and maintaining good CBG control to prevent long-term and short-term complications. Explained how hyperglycemia leads to damage within blood vessels which lead to the common complications seen with uncontrolled diabetes. Stressed to the patient the importance of improving glycemic control to prevent further complications from uncontrolled diabetes. Encouraged patient to check glucose 4 times per day (before meals and at bedtime) and to keep a log book of glucose readings and DM medication taken which patient will need to take to doctor appointments. Encouraged patient to make follow up appointment with Endocrinologist at the [facility] and to allow his wife to assist witj DM management  when needed especially to prevent DKA reoccurrence. Patient verbalized understanding of information discussed and reports no further questions at this time related to diabetes.  Thanks, Barnie Alderman, RN, MSN, CDE Diabetes Coordinator Inpatient Diabetes Program [phone] (Team Pager from 8am to 5pm)

## 2018-08-08 NOTE — Progress Notes (Signed)
Report received. Awaiting patient's arrival.

## 2018-08-08 NOTE — Progress Notes (Signed)
Called Ed to receive report. Nurse Arby Barrette informs she will call back. Awaiting call back.

## 2018-08-08 NOTE — Progress Notes (Signed)
[facility] NAME: Shawn [name]    MR#:  [id number]  DATE OF BIRTH:  [birth year]  SUBJECTIVE:  CHIEF COMPLAINT:   Chief Complaint  Patient presents with  . Hyperglycemia  No complaints REVIEW OF SYSTEMS:  Review of Systems  Constitutional: Negative for diaphoresis, fever, malaise/fatigue and weight loss.  HENT: Negative for ear discharge, ear pain, hearing loss, nosebleeds, sore throat and tinnitus.   Eyes: Negative for blurred vision and pain.  Respiratory: Negative for cough, hemoptysis, shortness of breath and wheezing.   Cardiovascular: Negative for chest pain, palpitations, orthopnea and leg swelling.  Gastrointestinal: Negative for abdominal pain, blood in stool, constipation, diarrhea, heartburn, nausea and vomiting.  Genitourinary: Negative for dysuria, frequency and urgency.  Musculoskeletal: Negative for back pain and myalgias.  Skin: Negative for itching and rash.  Neurological: Negative for dizziness, tingling, tremors, focal weakness, seizures, weakness and headaches.  Psychiatric/Behavioral: Negative for depression. The patient is not nervous/anxious.     DRUG ALLERGIES:  No Known Allergies VITALS:  Blood pressure 112/71, pulse 72, temperature 97.8 F (36.6 C), temperature source Oral, resp. rate 19, height 5\' 11"  (1.803 m), weight 63.7 kg, SpO2 99 %. PHYSICAL EXAMINATION:  Physical Exam HENT:     Head: Normocephalic and atraumatic.  Eyes:     Conjunctiva/sclera: Conjunctivae normal.     Pupils: Pupils are equal, round, and reactive to light.  Neck:     Musculoskeletal: Normal range of motion and neck supple.     Thyroid: No thyromegaly.     Trachea: No tracheal deviation.  Cardiovascular:     Rate and Rhythm: Normal rate and regular rhythm.     Heart sounds: Normal heart sounds.  Pulmonary:     Effort: Pulmonary effort is normal. No respiratory distress.     Breath sounds: Normal breath  sounds. No wheezing.  Chest:     Chest wall: No tenderness.  Abdominal:     General: Bowel sounds are normal. There is no distension.     Palpations: Abdomen is soft.     Tenderness: There is no abdominal tenderness.  Musculoskeletal: Normal range of motion.  Skin:    General: Skin is warm and dry.     Findings: No rash.  Neurological:     Mental Status: He is alert and oriented to person, place, and time.     Cranial Nerves: No cranial nerve deficit.    LABORATORY PANEL:  Male CBC Recent Labs  Lab 08/08/18 0144  WBC 9.6  HGB 11.1*  HCT 31.8*  PLT 268   ------------------------------------------------------------------------------------------------------------------ Chemistries  Recent Labs  Lab 08/07/18 1554 08/08/18 0144 08/08/18 0615  NA 131* 136 141  K 5.2* 3.8 3.5  CL 92* 104 106  CO2 11* 21* 28  GLUCOSE 954* 425* 173*  BUN 79* 65* 60*  CREATININE 2.49* 1.74* 1.41*  CALCIUM 8.4* 8.3* 8.4*  MG  --  2.6*  --   AST 24  --   --   ALT 18  --   --   ALKPHOS 110  --   --   BILITOT 1.9*  --   --    RADIOLOGY:  No results found. ASSESSMENT AND PLAN:  Shawn [name]  is a 63 y.o. male with a known history of non-ischemic cardiomyopathy with EF of less than 20% by Echo May 2020, hypertension, CKD stage II with baseline creatinine 1.6, prior history of alcohol  abuse admitted with generalized weakness. Patient has missed his insulin. He is found to be in DKA.  1. DKA and type II diabetes. Patient missed insulin since Saturday - off insulin drip - will start Novolog 3 units TID with meals for meal coverage  2. Anion gap acidosis secondary to DKA -Resolved  3. Acute renal failure on chronic kidney disease stage 2/3 -baseline creatinine or a 1.6 -creatinine today at 1.41 with hydration -continue IV fluids cautiously given history of ischemic cardiomyopathy -hold Lasix, Metolazone, spironolactone, entresto  4. Hyperkalemia -resolved now  5. Non ischemic  cardiomyopathy -patient follows with Dr. Rockey Situ -EF of 20 to 25% by last April May 2020 -Start home dose Lasix 40 mg daily  6. History of CVA in the past -continue aspirin     All the records are reviewed and case discussed with Care Management/Social Worker. Management plans discussed with the patient, family and they are in agreement.  CODE STATUS: Full Code  TOTAL TIME TAKING CARE OF THIS PATIENT: 20 minutes.   More than 50% of the time was spent in counseling/coordination of care: YES  POSSIBLE D/C IN 1-2 DAYS, DEPENDING ON CLINICAL CONDITION.   Max Sane M.D on 08/08/2018 at 5:08 PM  Between 7am to 6pm - Pager - [phone]  After 6pm go to [email or web address] - Proofreader  [facility]  Office  [phone]  CC: Primary care physician; [place]  Note: This dictation was prepared with Dragon dictation along with smaller phrase technology. Any transcriptional errors that result from this process are unintentional.

## 2018-08-09 LAB — BASIC METABOLIC PANEL
Anion gap: 7 (ref 5–15)
BUN: 41 mg/dL — ABNORMAL HIGH (ref 8–23)
CO2: 27 mmol/L (ref 22–32)
Calcium: 8.1 mg/dL — ABNORMAL LOW (ref 8.9–10.3)
Chloride: 105 mmol/L (ref 98–111)
Creatinine, Ser: 0.99 mg/dL (ref 0.61–1.24)
GFR calc Af Amer: >60 mL/min (ref >60)
GFR calc non Af Amer: >60 mL/min (ref >60)
Glucose, Bld: 75 mg/dL (ref 70–99)
Potassium: 3.7 mmol/L (ref 3.5–5.1)
Sodium: 139 mmol/L (ref 135–145)

## 2018-08-09 LAB — CBC
HCT: 35.2 % — ABNORMAL LOW (ref 39.0–52.0)
Hemoglobin: 12.1 g/dL — ABNORMAL LOW (ref 13.0–17.0)
MCH: 29 pg (ref 26.0–34.0)
MCHC: 34.4 g/dL (ref 30.0–36.0)
MCV: 84.4 fL (ref 80.0–100.0)
Platelets: 264 10*3/uL (ref 150–400)
RBC: 4.17 MIL/uL — ABNORMAL LOW (ref 4.22–5.81)
RDW: 14.4 % (ref 11.5–15.5)
WBC: 8.6 10*3/uL (ref 4.0–10.5)
nRBC: 0 % (ref 0.0–0.2)

## 2018-08-09 LAB — GLUCOSE, CAPILLARY
Glucose-Capillary: 181 mg/dL — ABNORMAL HIGH (ref 70–99)
Glucose-Capillary: 222 mg/dL — ABNORMAL HIGH (ref 70–99)

## 2018-08-09 MED ORDER — INSULIN GLARGINE 100 UNIT/ML ~~LOC~~ SOLN
13.0000 [IU] | Freq: Every day | SUBCUTANEOUS | Status: DC
  Administered 2018-08-09: 13 [IU] via SUBCUTANEOUS
  Filled 2018-08-09 (×2): qty 0.13

## 2018-08-09 MED ORDER — ATORVASTATIN CALCIUM 40 MG PO TABS: 20.0000 mg | ORAL_TABLET | Freq: Every day | ORAL | 0 refills | Status: DC

## 2018-08-09 NOTE — Progress Notes (Addendum)
Inpatient Diabetes Program Recommendations  AACE/ADA: New Consensus Statement on Inpatient Glycemic Control   Target Ranges:  Prepandial:   less than 140 mg/dL      Peak postprandial:   less than 180 mg/dL (1-2 hours)      Critically ill patients:  140 - 180 mg/dL  Results for RADWAN, COWLEY (MRN [id number]) as of 08/09/2018 07:35  Ref. Range 08/09/2018 04:49  Glucose Latest Ref Range: 70 - 99 mg/dL 75   Results for NELDON, SHEPARD (MRN [id number]) as of 08/09/2018 07:35  Ref. Range 08/08/2018 07:37 08/08/2018 08:26 08/08/2018 12:17 08/08/2018 16:10 08/08/2018 17:06 08/08/2018 21:40  Glucose-Capillary Latest Ref Range: 70 - 99 mg/dL    Lantus 15 units 91 127 (H)  Novolog 1 unit 67 (L) 81 91  Results for CHARLS, CUSTER (MRN [id number]) as of 08/09/2018 07:35  Ref. Range 08/08/2018 01:44  Hemoglobin A1C Latest Ref Range: 4.8 - 5.6 % 13.5 (H)   Review of Glycemic Control  Outpatient Diabetes medications: Lantus 15 units QHS, Novolog 5 units TID with meals Current orders for Inpatient glycemic control: Lantus 15 units daily, Novolog 0-9 units TID with meals, Novolog 0-5 units QHS, Novolog 3 units TID with meals for meal coverage  Inpatient Diabetes Program Recommendations:   Insulin - Basal: Please consider decreasing Lantus to 13 units daily.  HgbA1C: A1C 13.5% on 08/08/18 indicating an average glucose of 341 mg/dl over the past 2-3 months. Anticipate patient is not taking insulin consistently given A1C results and glucose trends noted inpatient with Lantus 15 units daily.   Thanks, Barnie Alderman, RN, MSN, CDE Diabetes Coordinator Inpatient Diabetes Program [phone] (Team Pager from 8am to 5pm)

## 2018-08-09 NOTE — Progress Notes (Signed)
Pt is d/ced home.  He was admitted because of blood sugars in the 900's.  Pt is back to baseline.  He has received education regarding the need to be compliant with medication.  IVs were removed.  No new medications were ordered.  Reviewed f/u appts.  Pt's wife will transport him home.

## 2018-08-09 NOTE — Discharge Instructions (Signed)
Diabetic Ketoacidosis °Diabetic ketoacidosis is a serious complication of diabetes. This condition develops when there is not enough insulin in the body. Insulin is an hormone that regulates blood sugar levels in the body. Normally, insulin allows glucose to enter the cells in the body. The cells break down glucose for energy. Without enough insulin, the body cannot break down glucose, so it breaks down fats instead. This leads to high blood glucose levels in the body and the production of acids that are called ketones. Ketones are poisonous at high levels. °If diabetic ketoacidosis is not treated, it can cause severe dehydration and can lead to a coma or death. °What are the causes? °This condition develops when a lack of insulin causes the body to break down fats instead of glucose. This may be triggered by: °· Stress on the body. This stress is brought on by an illness. °· Infection. °· Medicines that raise blood glucose levels. °· Not taking diabetes medicine. °· New onset of type 1 diabetes mellitus. °What are the signs or symptoms? °Symptoms of this condition include: °· Fatigue. °· Weight loss. °· Excessive thirst. °· Light-headedness. °· Fruity or sweet-smelling breath. °· Excessive urination. °· Vision changes. °· Confusion or irritability. °· Nausea. °· Vomiting. °· Rapid breathing. °· Abdominal pain. °· Feeling flushed. °How is this diagnosed? °This condition is diagnosed based on your medical history, a physical exam, and blood tests. You may also have a urine test to check for ketones. °How is this treated? °This condition may be treated with: °· Fluid replacement. This may be done to correct dehydration. °· Insulin injections. These may be given through the skin or through an IV tube. °· Electrolyte replacement. Electrolytes are minerals in your blood. Electrolytes such as potassium and sodium may be given in pill form or through an IV tube. °· Antibiotic medicines. These may be prescribed if your  condition was caused by an infection. °Diabetic ketoacidosis is a serious medical condition. You may need emergency treatment in the hospital to monitor your condition. °Follow these instructions at home: °Eating and drinking °· Drink enough fluids to keep your urine clear or pale yellow. °· If you are not able to eat, drink clear fluids in small amounts as you are able. Clear fluids include water, ice chips, fruit juice with water added (diluted), and low-calorie sports drinks. You may also have sugar-free jello or popsicles. °· If you are able to eat, follow your usual diet and drink sugar-free liquids, such as water. °Medicines °· Take over-the-counter and prescription medicines only as told by your health care provider. °· Continue to take insulin and other diabetes medicines as told by your health care provider. °· If you were prescribed an antibiotic, take it as told by your health care provider. Do not stop taking the antibiotic even if you start to feel better. °General instructions ° °· Check your urine for ketones when you are ill and as told by your health care provider. °? If your blood glucose is 240 mg/dL (13.3 mmol/L) or higher, check your urine ketones every 4-6 hours. °· Check your blood glucose every day, as often as told by your health care provider. °? If your blood glucose is high, drink plenty of fluids. This helps to flush out ketones. °? If your blood glucose is above your target for 2 tests in a row, contact your health care provider. °· Carry a medical alert card or wear medical alert jewelry that says that you have diabetes. °· Rest   and exercise only as told by your health care provider. Do not exercise when your blood glucose is high and you have ketones in your urine. °· If you get sick, call your health care provider and begin treatment quickly. Your body often needs extra insulin to fight an illness. Check your blood glucose every 4-6 hours when you are sick. °· Keep all follow-up  visits as told by your health care provider. This is important. °Contact a health care provider if: °· Your blood glucose level is higher than 240 mg/dL (13.3 mmol/L) for 2 days in a row. °· You have moderate or large ketones in your urine. °· You have a fever. °· You cannot eat or drink without vomiting. °· You have been vomiting for more than 2 hours. °· You continue to have symptoms of diabetic ketoacidosis. °· You develop new symptoms. °Get help right away if: °· Your blood glucose monitor reads “high” even when you are taking insulin. °· You faint. °· You have chest pain. °· You have trouble breathing. °· You have sudden trouble speaking or swallowing. °· You have vomiting or diarrhea that gets worse after 3 hours. °· You are unable to stay awake. °· You have trouble thinking. °· You are severely dehydrated. Symptoms of severe dehydration include: °? Extreme thirst. °? Dry mouth. °? Rapid breathing. °These symptoms may represent a serious problem that is an emergency. Do not wait to see if the symptoms will go away. Get medical help right away. Call your local emergency services (911 in the [place] Do not drive yourself to the hospital. °Summary °· Diabetic ketoacidosis is a serious complication of diabetes. This condition develops when there is not enough insulin in the body. °· This condition is diagnosed based on your medical history, a physical exam, and blood tests. You may also have a urine test to check for ketones. °· Diabetic ketoacidosis is a serious medical condition. You may need emergency treatment in the hospital to monitor your condition. °· Contact your health care provider if your blood glucose is higher than 240 mg/dl for 2 days in a row or if you have moderate or large ketones in your urine. °This information is not intended to replace advice given to you by your health care provider. Make sure you discuss any questions you have with your health care provider. °Document Released: 12/19/1999  Document Revised: 02/06/2016 Document Reviewed: 01/26/2016 °Elsevier Patient Education © 2020 Elsevier Inc. ° °

## 2018-08-10 NOTE — Discharge Summary (Signed)
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 Chronic combined systolic (congestive) and diastolic (congestive) heart failure ([place])    a. TTE 6/15: EF < 20%, mildly dilated LV, DD, mildly dilated LA, mod dilated RA, mild MR, mild to mod TR, mod increased posterior wall thickness, elevated LA and LVEDP; b. 4/12019 Echo: EF 20-25%, diff HK. Gr1 DD, nl RV fxn.  . Shawn (chronic kidney disease), stage II   . Diabetes mellitus with complication ([place])    a. Shawn admissions w/ [name] (last 12/2017).  . Essential Shawn [name]   . NICM (nonischemic cardiomyopathy) ([place])    a. 06/2013 Echo: EF<20%; b. 06/2013 Cath: no signif dzs; c. 04/2017 Echo: 20-25%, gr1 DD.; d.5/[name] Echo: 25-30%  . Polysubstance abuse ([place])    a. etoh and tobacco  . Stroke [facility])    HOSPITAL COURSE:  Shawn [name],Shawn [name], Shawn [name], Shawn [name]  1.[name] and type II diabetes: Due to medication noncompliance - resolved with [name] protocol  2.Anion gap acidosis secondary to [name] -Resolved  3.Acute renal failure on chronic kidney disease stage 2/3 -Improved with hydration and holding diuretics  4.Hyperkalemia -resolved now  5.Non ischemic cardiomyopathy -patient follows with Dr. Rockey Situ -EF of 20 to 25% by last  April May [name] -Start home dose Lasix 40 mg at discharge  6.History of CVA in the past -continue aspirin  DISCHARGE CONDITIONS:  stable CONSULTS OBTAINED:   DRUG ALLERGIES:  No Known Allergies DISCHARGE MEDICATIONS:   Allergies as of 8/5/[name]   No Known Allergies     Medication List    TAKE these medications   aspirin 81 MG chewable tablet Chew 1 tablet (81 mg total) by mouth daily.   atorvastatin 40 MG tablet Commonly known as: LIPITOR Take 0.5 tablets (20 mg total) by mouth at bedtime.   B-Complex-C Tabs Take 1 tablet by mouth daily.   baclofen 10 MG tablet Commonly known as: LIORESAL Take 10 mg by mouth 2 (two) times daily as needed for muscle spasms. Muscle spasm   carvedilol 12.5 MG tablet Commonly known as: COREG TAKE 1 TABLET BY MOUTH TWICE A DAY WITH A MEAL   furosemide 40 MG tablet Commonly known as: LASIX Take 40 mg by mouth daily.   hydrALAZINE 100 MG tablet Commonly known as: APRESOLINE Take 100 mg by mouth 3 (three) times daily.   insulin aspart 100 UNIT/ML injection Commonly known as: novoLOG Inject 5 Units into the skin 3 (three) times daily with meals.   insulin glargine 100 UNIT/ML injection Commonly known as: LANTUS Inject 15 Units into the skin at bedtime.   metolazone 5 MG tablet Commonly known as: ZAROXOLYN Take 1 tablet (5 mg) by mouth x 1 dose, 30 minutes Shawn to morning lasix on days your weight is 150 lbs or above What changed: additional instructions  potassium chloride SA 20 MEQ tablet Commonly known as: K-DUR Take 20 mEq by mouth daily.   sacubitril-valsartan 97-103 MG Commonly known as: ENTRESTO Take 1 tablet by mouth 2 (two) times daily.   spironolactone 25 MG tablet Commonly known as: ALDACTONE Take 25 mg by mouth at bedtime.        DISCHARGE INSTRUCTIONS:   DIET:  Regular diet DISCHARGE CONDITION:  Good ACTIVITY:  Activity as tolerated OXYGEN:  Home Oxygen: No.  Oxygen Delivery: room air  DISCHARGE LOCATION:  home   If you experience worsening of your admission symptoms, develop shortness of breath, life threatening emergency, suicidal or homicidal thoughts you must seek medical attention immediately by calling 911 or calling your MD immediately  if symptoms less severe.  You Must read complete instructions/literature along with all the possible adverse reactions/side effects for all the Medicines you take and that have been prescribed to you. Take any new Medicines after you have completely understood and accpet all the possible adverse reactions/side effects.   Please note  You were cared for by a hospitalist during your hospital stay. If you have any questions about your discharge medications or the care you received while you were in the hospital after you are discharged, you can call the unit and asked to speak with the hospitalist on call if the hospitalist that took care of you is not available. Once you are discharged, your primary care physician will handle any further medical issues. Please note that NO REFILLS for any discharge medications will be authorized once you are discharged, as it is imperative that you return to your primary care physician (or establish a relationship with a primary care physician if you do not have one) for your aftercare needs so that they can reassess your need for medications and monitor your lab values.    On the day of Discharge:  VITAL SIGNS:  Blood pressure 138/88, pulse 71, temperature 97.8 F (36.6 C), resp. rate 16, height 5\' 11"  (1.803 m), weight 63.7 kg, SpO2 100 %. PHYSICAL EXAMINATION:  GENERAL:  63 y.o.-year-old patient lying in the bed with no acute distress.  EYES: Pupils equal, round, reactive to light and accommodation. No scleral icterus. Extraocular muscles intact.  HEENT: Head atraumatic, normocephalic. Oropharynx and nasopharynx clear.  NECK:  Supple, no jugular venous distention. No thyroid enlargement, no tenderness.   LUNGS: Normal breath sounds bilaterally, no wheezing, rales,rhonchi or crepitation. No use of accessory muscles of respiration.  CARDIOVASCULAR: S1, S2 normal. No murmurs, rubs, or gallops.  ABDOMEN: Soft, non-tender, non-distended. Bowel sounds present. No organomegaly or mass.  EXTREMITIES: No pedal edema, cyanosis, or clubbing.  NEUROLOGIC: Cranial nerves II through XII are intact. Muscle strength 5/5 in all extremities. Sensation intact. Gait not checked.  PSYCHIATRIC: The patient is alert and oriented x 3.  SKIN: No obvious rash, lesion, or ulcer.  DATA REVIEW:   CBC Recent Labs  Lab 08/09/18 0449  WBC 8.6  HGB 12.1*  HCT 35.2*  PLT 264    Chemistries  Recent Labs  Lab 08/07/18 1554 08/08/18 0144  08/09/18 0449  NA 131* 136   < > 139  K 5.2* 3.8   < > 3.7  CL 92* 104   < > 105  CO2 11* 21*   < > 27  GLUCOSE 954* 425*   < > 75  BUN 79* 65*   < > 41*  CREATININE 2.49* 1.74*   < > 0.99  CALCIUM 8.4* 8.3*   < >  8.1*  MG  --  2.6*  --   --   AST 24  --   --   --   ALT 18  --   --   --   ALKPHOS 110  --   --   --   BILITOT 1.9*  --   --   --    < > = values in this interval not displayed.     Follow-up [place]. Schedule an appointment as soon as possible for a visit in 5 day(s).   Specialty: General Practice Why: The [place] office will call you to schedule an appointment Contact information: [address] [place] [address] [phone]        Minna Merritts, MD. Go on 8/19/[name].   Specialty: Cardiology Why: 10:00 am Contact information: [place] [address] [phone]           Management plans discussed with the patient, nursing and they are in agreement.  CODE STATUS: Shawn   TOTAL TIME TAKING CARE OF THIS PATIENT: 45 minutes.    Max Sane M.D on 8/6/[name] at 2:59 PM  Between 7am to 6pm - Pager - [phone]  After 6pm go to [email or web address] - Proofreader  [facility]  Office  [phone]  CC: Primary care physician; [place]   Note: This dictation was prepared with Dragon dictation along with smaller phrase technology. Any transcriptional errors that result from this process are unintentional.

## 2018-08-23 ENCOUNTER — Ambulatory Visit: Payer: Medicaid Other | Admitting: Nurse Practitioner

## 2018-09-03 NOTE — Progress Notes (Unsigned)
No show

## 2018-09-04 ENCOUNTER — Encounter: Payer: Self-pay | Admitting: Cardiovascular Disease

## 2018-09-04 ENCOUNTER — Telehealth: Payer: No Typology Code available for payment source | Admitting: Cardiovascular Disease

## 2018-09-04 ENCOUNTER — Other Ambulatory Visit: Payer: Self-pay

## 2018-09-04 VITALS — BP 160/89 | HR 83 | Ht 73.0 in | Wt 134.3 lb

## 2018-09-22 ENCOUNTER — Other Ambulatory Visit: Payer: Self-pay

## 2018-09-22 ENCOUNTER — Encounter: Payer: Self-pay | Admitting: Emergency Medicine

## 2018-09-22 ENCOUNTER — Inpatient Hospital Stay
Admission: EM | Admit: 2018-09-22 | Discharge: 2018-09-23 | DRG: 642 | Disposition: A | Discharge status: Home / Self Care | Payer: No Typology Code available for payment source | Attending: Specialist | Admitting: Specialist

## 2018-09-22 DIAGNOSIS — Z8673 Personal history of transient ischemic attack (TIA), and cerebral infarction without residual deficits: Secondary | ICD-10-CM | POA: Diagnosis not present

## 2018-09-22 DIAGNOSIS — I110 Hypertensive heart disease with heart failure: Secondary | ICD-10-CM | POA: Diagnosis present

## 2018-09-22 DIAGNOSIS — Z79899 Other long term (current) drug therapy: Secondary | ICD-10-CM | POA: Diagnosis not present

## 2018-09-22 DIAGNOSIS — I428 Other cardiomyopathies: Secondary | ICD-10-CM | POA: Diagnosis present

## 2018-09-22 DIAGNOSIS — Z794 Long term (current) use of insulin: Secondary | ICD-10-CM | POA: Diagnosis not present

## 2018-09-22 DIAGNOSIS — Z03818 Encounter for observation for suspected exposure to other biological agents ruled out: Secondary | ICD-10-CM | POA: Diagnosis not present

## 2018-09-22 DIAGNOSIS — E785 Hyperlipidemia, unspecified: Secondary | ICD-10-CM | POA: Diagnosis present

## 2018-09-22 DIAGNOSIS — Z8249 Family history of ischemic heart disease and other diseases of the circulatory system: Secondary | ICD-10-CM | POA: Diagnosis not present

## 2018-09-22 DIAGNOSIS — Z20828 Contact with and (suspected) exposure to other viral communicable diseases: Secondary | ICD-10-CM | POA: Diagnosis present

## 2018-09-22 DIAGNOSIS — E871 Hypo-osmolality and hyponatremia: Secondary | ICD-10-CM | POA: Diagnosis not present

## 2018-09-22 DIAGNOSIS — E7251 Non-ketotic hyperglycinemia: Secondary | ICD-10-CM | POA: Diagnosis present

## 2018-09-22 DIAGNOSIS — Z23 Encounter for immunization: Secondary | ICD-10-CM

## 2018-09-22 DIAGNOSIS — Z7982 Long term (current) use of aspirin: Secondary | ICD-10-CM | POA: Diagnosis not present

## 2018-09-22 DIAGNOSIS — N179 Acute kidney failure, unspecified: Secondary | ICD-10-CM | POA: Diagnosis present

## 2018-09-22 DIAGNOSIS — I5042 Chronic combined systolic (congestive) and diastolic (congestive) heart failure: Secondary | ICD-10-CM | POA: Diagnosis not present

## 2018-09-22 DIAGNOSIS — R531 Weakness: Secondary | ICD-10-CM | POA: Diagnosis not present

## 2018-09-22 DIAGNOSIS — Z9119 Patient's noncompliance with other medical treatment and regimen: Secondary | ICD-10-CM

## 2018-09-22 DIAGNOSIS — E162 Hypoglycemia, unspecified: Secondary | ICD-10-CM | POA: Diagnosis present

## 2018-09-22 DIAGNOSIS — E1100 Type 2 diabetes mellitus with hyperosmolarity without nonketotic hyperglycemic-hyperosmolar coma (NKHHC): Secondary | ICD-10-CM | POA: Diagnosis not present

## 2018-09-22 DIAGNOSIS — E878 Other disorders of electrolyte and fluid balance, not elsewhere classified: Secondary | ICD-10-CM | POA: Diagnosis not present

## 2018-09-22 DIAGNOSIS — E1110 Type 2 diabetes mellitus with ketoacidosis without coma: Secondary | ICD-10-CM

## 2018-09-22 DIAGNOSIS — E1165 Type 2 diabetes mellitus with hyperglycemia: Secondary | ICD-10-CM | POA: Diagnosis not present

## 2018-09-22 DIAGNOSIS — E118 Type 2 diabetes mellitus with unspecified complications: Secondary | ICD-10-CM

## 2018-09-22 DIAGNOSIS — E860 Dehydration: Secondary | ICD-10-CM | POA: Diagnosis present

## 2018-09-22 DIAGNOSIS — R0602 Shortness of breath: Secondary | ICD-10-CM | POA: Diagnosis present

## 2018-09-22 DIAGNOSIS — F17210 Nicotine dependence, cigarettes, uncomplicated: Secondary | ICD-10-CM | POA: Diagnosis not present

## 2018-09-22 DIAGNOSIS — I10 Essential (primary) hypertension: Secondary | ICD-10-CM | POA: Diagnosis not present

## 2018-09-22 DIAGNOSIS — Z833 Family history of diabetes mellitus: Secondary | ICD-10-CM

## 2018-09-22 DIAGNOSIS — Z716 Tobacco abuse counseling: Secondary | ICD-10-CM | POA: Diagnosis not present

## 2018-09-22 LAB — CBC
HCT: 38.4 % — ABNORMAL LOW (ref 39.0–52.0)
Hemoglobin: 13 g/dL (ref 13.0–17.0)
MCH: 30 pg (ref 26.0–34.0)
MCHC: 33.9 g/dL (ref 30.0–36.0)
MCV: 88.7 fL (ref 80.0–100.0)
Platelets: 377 10*3/uL (ref 150–400)
RBC: 4.33 MIL/uL (ref 4.22–5.81)
RDW: 13.6 % (ref 11.5–15.5)
WBC: 5.6 10*3/uL (ref 4.0–10.5)
nRBC: 0 % (ref 0.0–0.2)

## 2018-09-22 LAB — GLUCOSE, CAPILLARY
Glucose-Capillary: 109 mg/dL — ABNORMAL HIGH (ref 70–99)
Glucose-Capillary: 207 mg/dL — ABNORMAL HIGH (ref 70–99)
Glucose-Capillary: 260 mg/dL — ABNORMAL HIGH (ref 70–99)
Glucose-Capillary: 353 mg/dL — ABNORMAL HIGH (ref 70–99)
Glucose-Capillary: 443 mg/dL — ABNORMAL HIGH (ref 70–99)
Glucose-Capillary: 490 mg/dL — ABNORMAL HIGH (ref 70–99)
Glucose-Capillary: 81 mg/dL (ref 70–99)
Glucose-Capillary: >600 mg/dL (ref 70–99)
Glucose-Capillary: >600 mg/dL (ref 70–99)

## 2018-09-22 LAB — BLOOD GAS, VENOUS
Acid-Base Excess: 2.5 mmol/L — ABNORMAL HIGH (ref 0.0–2.0)
Bicarbonate: 28.4 mmol/L — ABNORMAL HIGH (ref 20.0–28.0)
O2 Saturation: 77.5 %
Patient temperature: 37
pCO2, Ven: 48 mmHg (ref 44.0–60.0)
pH, Ven: 7.38 (ref 7.250–7.430)
pO2, Ven: 43 mmHg (ref 32.0–45.0)

## 2018-09-22 LAB — COMPREHENSIVE METABOLIC PANEL
ALT: 24 U/L (ref 0–44)
AST: 27 U/L (ref 15–41)
Albumin: 3.3 g/dL — ABNORMAL LOW (ref 3.5–5.0)
Alkaline Phosphatase: 122 U/L (ref 38–126)
Anion gap: 20 — ABNORMAL HIGH (ref 5–15)
BUN: 51 mg/dL — ABNORMAL HIGH (ref 8–23)
CO2: 23 mmol/L (ref 22–32)
Calcium: 9.2 mg/dL (ref 8.9–10.3)
Chloride: 86 mmol/L — ABNORMAL LOW (ref 98–111)
Creatinine, Ser: 1.69 mg/dL — ABNORMAL HIGH (ref 0.61–1.24)
GFR calc Af Amer: 49 mL/min — ABNORMAL LOW (ref >60)
GFR calc non Af Amer: 43 mL/min — ABNORMAL LOW (ref >60)
Glucose, Bld: 717 mg/dL (ref 70–99)
Potassium: 4.9 mmol/L (ref 3.5–5.1)
Sodium: 129 mmol/L — ABNORMAL LOW (ref 135–145)
Total Bilirubin: 1.2 mg/dL (ref 0.3–1.2)
Total Protein: 6.5 g/dL (ref 6.5–8.1)

## 2018-09-22 LAB — URINALYSIS, COMPLETE (UACMP) WITH MICROSCOPIC
Bacteria, UA: NONE SEEN
Bilirubin Urine: NEGATIVE
Glucose, UA: >=500 mg/dL — AB
Hgb urine dipstick: NEGATIVE
Ketones, ur: 20 mg/dL — AB
Leukocytes,Ua: NEGATIVE
Nitrite: NEGATIVE
Protein, ur: 100 mg/dL — AB
Specific Gravity, Urine: 1.018 (ref 1.005–1.030)
Squamous Epithelial / HPF: NONE SEEN (ref 0–5)
pH: 5 (ref 5.0–8.0)

## 2018-09-22 LAB — MRSA PCR SCREENING: MRSA by PCR: NEGATIVE

## 2018-09-22 LAB — SARS CORONAVIRUS 2 BY RT PCR (HOSPITAL ORDER, PERFORMED IN ~~LOC~~ HOSPITAL LAB): SARS Coronavirus 2: NEGATIVE

## 2018-09-22 LAB — LIPASE, BLOOD: Lipase: 29 U/L (ref 11–51)

## 2018-09-22 LAB — TROPONIN I (HIGH SENSITIVITY): Troponin I (High Sensitivity): 15 ng/L (ref <18)

## 2018-09-22 MED ORDER — SODIUM CHLORIDE 0.9 % IV SOLN: INTRAVENOUS | Status: DC

## 2018-09-22 MED ORDER — INSULIN REGULAR(HUMAN) IN NACL 100-0.9 UT/100ML-% IV SOLN
INTRAVENOUS | Status: DC
  Administered 2018-09-22: 5.4 [IU]/h via INTRAVENOUS
  Filled 2018-09-22: qty 100

## 2018-09-22 MED ORDER — HYDRALAZINE HCL 25 MG PO TABS
25.0000 mg | ORAL_TABLET | Freq: Three times a day (TID) | ORAL | Status: DC
  Administered 2018-09-22 – 2018-09-23 (×2): 25 mg via ORAL
  Filled 2018-09-22 (×4): qty 1

## 2018-09-22 MED ORDER — SACUBITRIL-VALSARTAN 97-103 MG PO TABS
1.0000 | ORAL_TABLET | Freq: Two times a day (BID) | ORAL | Status: DC
  Administered 2018-09-22 – 2018-09-23 (×2): 1 via ORAL
  Filled 2018-09-22 (×3): qty 1

## 2018-09-22 MED ORDER — INFLUENZA VAC SPLIT QUAD 0.5 ML IM SUSY
0.5000 mL | PREFILLED_SYRINGE | INTRAMUSCULAR | Status: AC
  Administered 2018-09-23: 0.5 mL via INTRAMUSCULAR
  Filled 2018-09-22: qty 0.5

## 2018-09-22 MED ORDER — HEPARIN SODIUM (PORCINE) 5000 UNIT/ML IJ SOLN
5000.0000 [IU] | Freq: Three times a day (TID) | INTRAMUSCULAR | Status: DC
  Administered 2018-09-22 – 2018-09-23 (×2): 5000 [IU] via SUBCUTANEOUS
  Filled 2018-09-22 (×2): qty 1

## 2018-09-22 MED ORDER — SODIUM CHLORIDE 0.9 % IV BOLUS: 500.0000 mL | Freq: Once | INTRAVENOUS | Status: DC

## 2018-09-22 MED ORDER — ATORVASTATIN CALCIUM 20 MG PO TABS
20.0000 mg | ORAL_TABLET | Freq: Every day | ORAL | Status: DC
  Administered 2018-09-22: 20 mg via ORAL
  Filled 2018-09-22: qty 1

## 2018-09-22 MED ORDER — DEXTROSE-NACL 5-0.45 % IV SOLN: INTRAVENOUS | Status: DC

## 2018-09-22 MED ORDER — BACLOFEN 10 MG PO TABS
10.0000 mg | ORAL_TABLET | Freq: Two times a day (BID) | ORAL | Status: DC | PRN
  Filled 2018-09-22: qty 1

## 2018-09-22 MED ORDER — ASPIRIN 81 MG PO CHEW
81.0000 mg | CHEWABLE_TABLET | Freq: Every day | ORAL | Status: DC
  Administered 2018-09-23: 81 mg via ORAL
  Filled 2018-09-22: qty 1

## 2018-09-22 MED ORDER — SODIUM CHLORIDE 0.9 % IV SOLN
Freq: Once | INTRAVENOUS | Status: AC
  Administered 2018-09-22: 15:00:00 via INTRAVENOUS

## 2018-09-22 MED ORDER — CHLORHEXIDINE GLUCONATE CLOTH 2 % EX PADS
6.0000 | MEDICATED_PAD | Freq: Every day | CUTANEOUS | Status: DC
  Administered 2018-09-22 – 2018-09-23 (×2): 6 via TOPICAL

## 2018-09-22 MED ORDER — CARVEDILOL 3.125 MG PO TABS
3.1250 mg | ORAL_TABLET | Freq: Two times a day (BID) | ORAL | Status: DC
  Administered 2018-09-23: 3.125 mg via ORAL
  Filled 2018-09-22: qty 1

## 2018-09-22 MED ORDER — DOCUSATE SODIUM 100 MG PO CAPS: 100.0000 mg | ORAL_CAPSULE | Freq: Two times a day (BID) | ORAL | Status: DC | PRN

## 2018-09-22 MED ORDER — DEXTROSE 5 % AND 0.45 % NACL IV BOLUS
1000.0000 mL | INTRAVENOUS | Status: DC
  Administered 2018-09-22: 1000 mL via INTRAVENOUS

## 2018-09-22 MED ORDER — SODIUM CHLORIDE 0.9 % IV BOLUS
250.0000 mL | Freq: Once | INTRAVENOUS | Status: AC
  Administered 2018-09-22: 250 mL via INTRAVENOUS

## 2018-09-22 NOTE — Progress Notes (Signed)
Family Meeting Note  Advance Directive:yes  Today a meeting took place with the Patient.   The following clinical team members were present during this meeting:MD  The following were discussed:Patient's diagnosis: Nonketotic hyperglycemia, diabetes, chronic combined systolic and diastolic congestive heart failure with very low EF, hypertension, diabetes, Patient's progosis: Unable to determine and Goals for treatment: Full Code  Additional follow-up to be provided: Intensivist  Time spent during discussion: 16 min.  Vaughan Basta, MD

## 2018-09-22 NOTE — H&P (Signed)
[facility] [place] NAME: Shawn [name]    MR#:  [id number]  DATE OF BIRTH:  [birth year]  DATE OF ADMISSION:  09/22/2018  PRIMARY CARE PHYSICIAN: [facility] [place]   REQUESTING/REFERRING PHYSICIAN: [facility]  CHIEF COMPLAINT:   Chief Complaint  Patient presents with  . Abdominal Pain  . Emesis    HISTORY OF PRESENT ILLNESS: Shawn [name]  is a 63 y.o. 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  Started having nausea and vomiting today.  His blood sugar was very high at home so brought to emergency room.  Noted to have a blood sugar of up to 700 but his bicarb was not low and pH was in normal limit.  So he had nonketotic hyperglycemia in ER started on IV fluids and insulin drip and given to hospitalist service. ER has checked for UTI and COVID-19 and they are negative.  Patient denies any fever or chills.  PAST MEDICAL HISTORY:   Past Medical History:  Diagnosis Date  . Chronic combined systolic (congestive) and diastolic (congestive) heart failure ([place])    a. TTE 6/15: EF < 20%, mildly dilated LV, DD, mildly dilated LA, mod dilated RA, mild MR, mild to mod TR, mod increased posterior wall thickness, elevated LA and LVEDP; b. 4/12019 Echo: EF 20-25%, diff HK. Gr1 DD, nl RV fxn.  . CKD (chronic kidney disease), stage II   . Diabetes mellitus with complication ([place])    a. Prior admissions w/ DKA (last 12/2017).  . Essential hypertension   . NICM (nonischemic cardiomyopathy) ([place])    a. 06/2013 Echo: EF<20%; b. 06/2013 Cath: no signif dzs; c. 04/2017 Echo: 20-25%, gr1 DD.; d.05/2018 Echo: 25-30%  . Polysubstance abuse ([place])    a. etoh and tobacco  . Stroke ([place])     PAST SURGICAL  HISTORY:  Past Surgical History:  Procedure Laterality Date  . NO PAST SURGERIES      SOCIAL HISTORY:  Social History   Tobacco Use  . Smoking status: Current Every Day Smoker    Packs/day: 0.50    Types: Cigarettes  . Smokeless tobacco: Never Used  Substance Use Topics  . Alcohol use: Not Currently    Frequency: Never    FAMILY HISTORY:  Family History  Problem Relation Age of Onset  . Congestive Heart Failure Mother   . Diabetes Mother   . Congestive Heart Failure Brother     DRUG ALLERGIES: No Known Allergies  REVIEW OF SYSTEMS:   CONSTITUTIONAL: No fever, fatigue or weakness.  EYES: No blurred or double vision.  EARS, NOSE, AND THROAT: No tinnitus or ear pain.  RESPIRATORY: No cough, shortness of breath, wheezing or hemoptysis.  CARDIOVASCULAR: No chest pain, orthopnea, edema.  GASTROINTESTINAL: He had nausea, vomiting, no diarrhea or abdominal pain.  GENITOURINARY: No dysuria, hematuria.  ENDOCRINE: No polyuria, nocturia,  HEMATOLOGY: No anemia, easy bruising or bleeding SKIN: No rash or lesion. MUSCULOSKELETAL: No joint pain or arthritis.   NEUROLOGIC: No tingling, numbness, weakness.  PSYCHIATRY: No anxiety or depression.   MEDICATIONS AT HOME:  Prior to Admission medications   Medication Sig Start Date End Date Taking? Authorizing Provider  aspirin 81 MG chewable tablet Chew 1 tablet (81 mg total) by mouth daily. 04/30/17  Yes Demetrios Loll,  MD  atorvastatin (LIPITOR) 40 MG tablet Take 0.5 tablets (20 mg total) by mouth at bedtime. 08/09/18  Yes Max Sane, MD  carvedilol (COREG) 12.5 MG tablet TAKE 1 TABLET BY MOUTH TWICE A DAY WITH A MEAL 07/19/18  Yes Gollan, Kathlene November, MD  furosemide (LASIX) 40 MG tablet Take 40 mg by mouth daily.   Yes [provider]  hydrALAZINE (APRESOLINE) 100 MG tablet Take 100 mg by mouth 3 (three) times daily.    Yes [provider]  insulin aspart (NOVOLOG) 100 UNIT/ML injection Inject 5 Units into the skin 3  (three) times daily with meals.   Yes [provider]  insulin glargine (LANTUS) 100 UNIT/ML injection Inject 15 Units into the skin at bedtime.   Yes [provider]  potassium chloride SA (K-DUR) 20 MEQ tablet Take 20 mEq by mouth daily.   Yes [provider]  sacubitril-valsartan (ENTRESTO) 97-103 MG Take 1 tablet by mouth 2 (two) times daily. 06/29/18  Yes Minna Merritts, MD  spironolactone (ALDACTONE) 25 MG tablet Take 25 mg by mouth at bedtime.   Yes [provider]  B-Complex-C TABS Take 1 tablet by mouth daily.    [provider]  baclofen (LIORESAL) 10 MG tablet Take 10 mg by mouth 2 (two) times daily as needed for muscle spasms. Muscle spasm    [provider]  metolazone (ZAROXOLYN) 5 MG tablet Take 1 tablet (5 mg) by mouth x 1 dose, 30 minutes prior to morning lasix on days your weight is 150 lbs or above Patient taking differently: Take 1 tablet (5 mg) by mouth x 1 dose, 30 minutes prior to morning lasix on days your weight is 150 lbs or above as neeeded 05/12/18   Minna Merritts, MD      PHYSICAL EXAMINATION:   VITAL SIGNS: Blood pressure 118/68, pulse 71, temperature 98.1 F (36.7 C), temperature source Oral, resp. rate 19, height 5\' 11"  (1.803 m), weight 59 kg, SpO2 98 %.  GENERAL:  63 y.o.-year-old patient lying in the bed with no acute distress.  EYES: Pupils equal, round, reactive to light and accommodation. No scleral icterus. Extraocular muscles intact.  HEENT: Head atraumatic, normocephalic. Oropharynx and nasopharynx clear.  NECK:  Supple, no jugular venous distention. No thyroid enlargement, no tenderness.  LUNGS: Normal breath sounds bilaterally, no wheezing, rales,rhonchi or crepitation. No use of accessory muscles of respiration.  CARDIOVASCULAR: S1, S2 normal. No murmurs, rubs, or gallops.  ABDOMEN: Soft, nontender, nondistended. Bowel sounds present. No organomegaly or mass.  EXTREMITIES: No pedal edema,  cyanosis, or clubbing.  NEUROLOGIC: Cranial nerves II through XII are intact. Muscle strength 5/5 in all extremities. Sensation intact. Gait not checked.  PSYCHIATRIC: The patient is alert and oriented x 3.  SKIN: No obvious rash, lesion, or ulcer.   LABORATORY PANEL:   CBC Recent Labs  Lab 09/22/18 1351  WBC 5.6  HGB 13.0  HCT 38.4*  PLT 377  MCV 88.7  MCH 30.0  MCHC 33.9  RDW 13.6   ------------------------------------------------------------------------------------------------------------------  Chemistries  Recent Labs  Lab 09/22/18 1351  NA 129*  K 4.9  CL 86*  CO2 23  GLUCOSE 717*  BUN 51*  CREATININE 1.69*  CALCIUM 9.2  AST 27  ALT 24  ALKPHOS 122  BILITOT 1.2   ------------------------------------------------------------------------------------------------------------------ estimated creatinine clearance is 37.8 mL/min (A) (by C-G formula based on SCr of 1.69 mg/dL (H)). ------------------------------------------------------------------------------------------------------------------ No results for input(s): TSH, T4TOTAL, T3FREE, THYROIDAB in the last 72  hours.  Invalid input(s): FREET3   Coagulation profile No results for input(s): INR, PROTIME in the last 168 hours. ------------------------------------------------------------------------------------------------------------------- No results for input(s): DDIMER in the last 72 hours. -------------------------------------------------------------------------------------------------------------------  Cardiac Enzymes No results for input(s): CKMB, TROPONINI, MYOGLOBIN in the last 168 hours.  Invalid input(s): CK ------------------------------------------------------------------------------------------------------------------ Invalid input(s): POCBNP  ---------------------------------------------------------------------------------------------------------------  Urinalysis    Component Value  Date/Time   COLORURINE YELLOW (A) 09/22/2018 1352   APPEARANCEUR CLEAR (A) 09/22/2018 1352   LABSPEC 1.018 09/22/2018 1352   PHURINE 5.0 09/22/2018 1352   GLUCOSEU >=500 (A) 09/22/2018 1352   HGBUR NEGATIVE 09/22/2018 1352   BILIRUBINUR NEGATIVE 09/22/2018 1352   KETONESUR 20 (A) 09/22/2018 1352   PROTEINUR 100 (A) 09/22/2018 1352   NITRITE NEGATIVE 09/22/2018 1352   LEUKOCYTESUR NEGATIVE 09/22/2018 1352     RADIOLOGY: No results found.  EKG: Orders placed or performed during the hospital encounter of 09/22/18  . EKG 12-Lead  . EKG 12-Lead  . ED EKG  . ED EKG    IMPRESSION AND PLAN:  *Nonketotic hyperglycemia IV insulin drip started by ER. We will admit and monitor in stepdown unit. Until blood sugar is better controlled I will keep him n.p.o. I would not give too much IV fluid because of his ejection fraction of 20%. I will give normal saline at 75 mils per hour. He is given some fluid boluses by ER already.  *Chronic systolic plus diastolic combined congestive heart failure Currently no exacerbation symptoms. Ejection fraction is 20%. Continue his cardiac medications including beta-blocker, Entresto, spironolactone, I would like to hold Lasix for now because he appears slightly dehydrated.  *Acute renal failure Chronic kidney disease as listed in past history but it appears that patient's creatinine was 0.99 and GFR was more than 60 last month. So he does not have CKD. His creatinine is worse now and GFR is 43, with gentle IV hydration we should watch for improvement in renal function.  *Hyperlipidemia Continue statin.  *Active smoking Counseled to quit smoking for 4 minutes and offered nicotine patch.  All the records are reviewed and case discussed with ED provider. Management plans discussed with the patient, family and they are in agreement.  CODE STATUS: Full code Code Status History    Date Active Date Inactive Code Status Order ID Comments User  Context   08/08/2018 0047 08/09/2018 1841 Full Code [id number]  Fritzi Mandes, MD Inpatient   03/21/2018 0602 03/24/2018 1625 Full Code [id number]  Mansy, Arvella Merles, MD ED   12/15/2017 2358 12/17/2017 1933 Full Code [id number]  Lance Coon, MD Inpatient   04/27/2017 0531 04/29/2017 2118 Full Code [id number]  Harrie Foreman, MD Inpatient   Advance Care Planning Activity       TOTAL TIME TAKING CARE OF THIS PATIENT: 50 minutes.    Vaughan Basta M.D on 09/22/2018   Between 7am to 6pm - Pager - [phone]  After 6pm go to [email or web address] - password [email or web address] [facility]  Office  [phone]  CC: Primary care physician; [place]   Note: This dictation was prepared with Dragon dictation along with smaller phrase technology. Any transcriptional errors that result from this process are unintentional.

## 2018-09-22 NOTE — Consult Note (Signed)
Name: Shawn [name] MRN: [id number] DOB: Sep 07, 1955    ADMISSION DATE:  09/22/2018 CONSULTATION DATE: 09/22/2018  REFERRING MD : Dr. Anselm Jungling   CHIEF COMPLAINT: Vomiting   BRIEF PATIENT DESCRIPTION:  63 yo male admitted with acute on chronic renal failure and HHNK requiring insulin gtt   SIGNIFICANT EVENTS/STUDIES:  09/18-Pt admitted to the stepdown unit requiring insulin gtt   HISTORY OF PRESENT ILLNESS:   This is a 63 yo male with a PMH of Stroke, ETOH Abuse, Tobacco Abuse, Essential HTN, Nonischemic Cardiomyopathy, Type II Diabetes Mellitus, DKA, Stage II CKD, Chronic Combined Systolic Diastolic CHF (Echo [id number]: EF 25-30%).  He presented to [facility] ER on 09/18 with c/o nausea, vomiting, and weakness onset of symptoms 4 to 5 days prior to presentation.  Per ER notes pt unable to tolerate food or liquids due to persistent nausea, his last actual meal was several days prior to presentation. Despite poor po intake pts CBG readings at home were in the 400's.  He stated he has been compliant with outpatient medications. Lab results revealed Na+ 129, chloride 86, glucose 717, CO2 23, BUN 51, creatinine 1.69, anion gap 20, and vbg pH 7.38/pCO2 48/bicarb 28.5.  COVID-19 negative, but UA positive for >500 glucose/ketones.  Pt ruled in for HHNK, therefore pt received 250 ml NS bolus and insulin gtt initiated. Pt subsequently admitted to the stepdown unit per hospitalist team for additional workup and treatment.   PAST MEDICAL HISTORY :   has a past medical history of Chronic combined systolic (congestive) and diastolic (congestive) heart failure ([place]), CKD (chronic kidney disease), stage II, Diabetes mellitus with complication ([place]), Essential hypertension, NICM (nonischemic cardiomyopathy) ([place]), Polysubstance abuse ([place]), and Stroke ([place]).  has a past surgical history that includes No past surgeries. Prior to Admission medications   Medication Sig Start Date End Date Taking? Authorizing  Provider  aspirin 81 MG chewable tablet Chew 1 tablet (81 mg total) by mouth daily. 04/30/17  Yes Demetrios Loll, MD  atorvastatin (LIPITOR) 40 MG tablet Take 0.5 tablets (20 mg total) by mouth at bedtime. 08/09/18  Yes Max Sane, MD  carvedilol (COREG) 12.5 MG tablet TAKE 1 TABLET BY MOUTH TWICE A DAY WITH A MEAL 07/19/18  Yes Gollan, Kathlene November, MD  furosemide (LASIX) 40 MG tablet Take 40 mg by mouth daily.   Yes [provider]  hydrALAZINE (APRESOLINE) 100 MG tablet Take 100 mg by mouth 3 (three) times daily.    Yes [provider]  insulin aspart (NOVOLOG) 100 UNIT/ML injection Inject 5 Units into the skin 3 (three) times daily with meals.   Yes [provider]  insulin glargine (LANTUS) 100 UNIT/ML injection Inject 15 Units into the skin at bedtime.   Yes [provider]  potassium chloride SA (K-DUR) 20 MEQ tablet Take 20 mEq by mouth daily.   Yes [provider]  sacubitril-valsartan (ENTRESTO) 97-103 MG Take 1 tablet by mouth 2 (two) times daily. 06/29/18  Yes Minna Merritts, MD  spironolactone (ALDACTONE) 25 MG tablet Take 25 mg by mouth at bedtime.   Yes [provider]  B-Complex-C TABS Take 1 tablet by mouth daily.    [provider]  baclofen (LIORESAL) 10 MG tablet Take 10 mg by mouth 2 (two) times daily as needed for muscle spasms. Muscle spasm    [provider]  metolazone (ZAROXOLYN) 5 MG tablet Take 1 tablet (5 mg) by mouth x 1 dose, 30 minutes prior to morning lasix on days  your weight is 150 lbs or above Patient taking differently: Take 1 tablet (5 mg) by mouth x 1 dose, 30 minutes prior to morning lasix on days your weight is 150 lbs or above as neeeded 05/12/18   Minna Merritts, MD   No Known Allergies  FAMILY HISTORY:  family history includes Congestive Heart Failure in his brother and mother; Diabetes in his mother. SOCIAL HISTORY:  reports that he has been smoking cigarettes. He has been smoking about  0.50 packs per day. He has never used smokeless tobacco. He reports previous alcohol use.  REVIEW OF SYSTEMS: Positives in BOLD  Constitutional: poor po intake, fever, chills, weight loss, malaise/fatigue and diaphoresis.  HENT: Negative for hearing loss, ear pain, nosebleeds, congestion, sore throat, neck pain, tinnitus and ear discharge.   Eyes: Negative for blurred vision, double vision, photophobia, pain, discharge and redness.  Respiratory: Negative for cough, hemoptysis, sputum production, shortness of breath, wheezing and stridor.   Cardiovascular: Negative for chest pain, palpitations, orthopnea, claudication, leg swelling and PND.  Gastrointestinal: heartburn, nausea, vomiting, abdominal pain, diarrhea, constipation, blood in stool and melena.  Genitourinary: Negative for dysuria, urgency, frequency, hematuria and flank pain.  Musculoskeletal: Negative for myalgias, back pain, joint pain and falls.  Skin: Negative for itching and rash.  Neurological: dizziness, tingling, tremors, sensory change, speech change, focal weakness, seizures, loss of consciousness, weakness and headaches.  Endo/Heme/Allergies: hyperglycemia, environmental allergies and polydipsia. Does not bruise/bleed easily.  SUBJECTIVE:   VITAL SIGNS: Temp:  [98.1 F (36.7 C)] 98.1 F (36.7 C) (09/18 1333) Pulse Rate:  [63-73] 73 (09/18 1700) Resp:  [15-18] 17 (09/18 1700) BP: (109-144)/(64-97) 109/64 (09/18 1700) SpO2:  [97 %-100 %] 97 % (09/18 1700) Weight:  [59 kg] 59 kg (09/18 1347)  PHYSICAL EXAMINATION: General: NAD Neuro:  AAOX 3, moves all extremities HEENT: [place]/AT, trachea midline, no JVD Cardiovascular: RRR, no MRG, +2 pulses, no edema Lungs: CTAB, no wheezing Abdomen: NT/ND, +BS X 4 Musculoskeletal:  +ROM, no deformities Skin: warm and dry  Recent Labs  Lab 09/22/18 1351  NA 129*  K 4.9  CL 86*  CO2 23  BUN 51*  CREATININE 1.69*  GLUCOSE 717*   Recent Labs  Lab 09/22/18 1351  HGB 13.0   HCT 38.4*  WBC 5.6  PLT 377   No results found.  ASSESSMENT / PLAN:  Hyperosmolar hyperglycemia non-ketoacidosis  Continue insulin gtt until 6 consecutive CBG readings <180 q1hr CBG's while on insulin gtt  Prn zofran for nausea/vomiting  Follow hypoglycemic protocol   Hypertension-controlled  Hx: Stroke and Chronic combined systolic diastolic CHF Continuous telemetry monitoring  Will check troponin  Continue outpatient cardiac medications   Acute on chronic renal failure  Hyponatremia and hypochloridemia in setting of HHNK Trend BMP  Replace electrolytes as indicated  Monitor UOP Avoid nephrotoxic medications  Gentle fluid rehydration due to CHF hx   Best Practice: Code Status:  Full. Diet: carb modified GI prophylaxis:  PPI. VTE prophylaxis:  SCD's /SQ  heparin.  Magdalene S. Tukov-Yual ANP-BC Pulmonary and [place] Pager [phone] or [phone]  NB: This document was prepared using Dragon voice recognition software and may include unintentional dictation errors.

## 2018-09-22 NOTE — ED Provider Notes (Addendum)
[facility] Emergency Department Provider Note  ____________________________________________   First MD Initiated Contact with Patient 09/22/18 1439     (approximate)  I have reviewed the triage vital signs and the nursing notes.   HISTORY  Chief Complaint Abdominal Pain and Emesis    HPI Shawn [name] is a 63 y.o. male with history of diabetes, systolic heart failure with EF of 30%, polysubstance abuse, here with nausea, vomiting, and weakness.  The patient states his symptoms started approximately 4 to 5 days ago.  He had seafood to eat and began vomiting.  He states that since then, he has had difficulty keeping anything on his stomach.  He said persistent nausea.  Has been essentially unable to eat.  Despite this, he has noticed progressively worsening blood sugars despite not eating.  He has intermittently been giving himself insulin but his sugars have been consistently reading in the 400s+.  He has felt generally weak.  He feels mildly short of breath.  His last actual meal was several days ago.  Endorses mild lightheadedness upon standing.  No fevers or chills.  No recent medication changes.  States his been taking his medications.  He does admit to dietary discretion.  No fever.        Past Medical History:  Diagnosis Date  . Chronic combined systolic (congestive) and diastolic (congestive) heart failure ([place])    a. TTE 6/15: EF < 20%, mildly dilated LV, DD, mildly dilated LA, mod dilated RA, mild MR, mild to mod TR, mod increased posterior wall thickness, elevated LA and LVEDP; b. 4/12019 Echo: EF 20-25%, diff HK. Gr1 DD, nl RV fxn.  . CKD (chronic kidney disease), stage II   . Diabetes mellitus with complication ([place])    a. Prior admissions w/ DKA (last 12/2017).  . Essential hypertension   . NICM (nonischemic cardiomyopathy) ([place])    a. 06/2013 Echo: EF<20%; b. 06/2013 Cath: no signif dzs; c. 04/2017 Echo: 20-25%, gr1 DD.; d.05/2018 Echo: 25-30%   . Polysubstance abuse ([place])    a. etoh and tobacco  . Stroke [facility])     Patient Active Problem List   Diagnosis Date Noted  . DKA, type 2 ([place]) 08/07/2018  . Essential hypertension 05/12/2018  . Chronic systolic CHF (congestive heart failure) ([place]) 12/15/2017  . NICM (nonischemic cardiomyopathy) ([place]) 05/13/2017  . Type 2 diabetes mellitus with complication, with long-term current use of insulin ([place]) 05/13/2017  . Smoker 05/13/2017  . Polysubstance abuse ([place]) 05/13/2017  . Alcohol abuse 05/13/2017  . Diabetic ketoacidosis without coma associated with type 2 diabetes mellitus ([place]) 04/27/2017    Past Surgical History:  Procedure Laterality Date  . NO PAST SURGERIES      Prior to Admission medications   Medication Sig Start Date End Date Taking? Authorizing Provider  aspirin 81 MG chewable tablet Chew 1 tablet (81 mg total) by mouth daily. 04/30/17  Yes Demetrios Loll, MD  atorvastatin (LIPITOR) 40 MG tablet Take 0.5 tablets (20 mg total) by mouth at bedtime. 08/09/18  Yes Max Sane, MD  carvedilol (COREG) 12.5 MG tablet TAKE 1 TABLET BY MOUTH TWICE A DAY WITH A MEAL 07/19/18  Yes Gollan, Kathlene November, MD  furosemide (LASIX) 40 MG tablet Take 40 mg by mouth daily.   Yes [provider]  hydrALAZINE (APRESOLINE) 100 MG tablet Take 100 mg by mouth 3 (three) times daily.    Yes [provider]  insulin aspart (NOVOLOG) 100 UNIT/ML injection Inject 5 Units  into the skin 3 (three) times daily with meals.   Yes [provider]  insulin glargine (LANTUS) 100 UNIT/ML injection Inject 15 Units into the skin at bedtime.   Yes [provider]  potassium chloride SA (K-DUR) 20 MEQ tablet Take 20 mEq by mouth daily.   Yes [provider]  sacubitril-valsartan (ENTRESTO) 97-103 MG Take 1 tablet by mouth 2 (two) times daily. 06/29/18  Yes Minna Merritts, MD  spironolactone (ALDACTONE) 25 MG tablet Take 25 mg by mouth at bedtime.   Yes [provider]   B-Complex-C TABS Take 1 tablet by mouth daily.    [provider]  baclofen (LIORESAL) 10 MG tablet Take 10 mg by mouth 2 (two) times daily as needed for muscle spasms. Muscle spasm    [provider]  metolazone (ZAROXOLYN) 5 MG tablet Take 1 tablet (5 mg) by mouth x 1 dose, 30 minutes prior to morning lasix on days your weight is 150 lbs or above Patient taking differently: Take 1 tablet (5 mg) by mouth x 1 dose, 30 minutes prior to morning lasix on days your weight is 150 lbs or above as neeeded 05/12/18   Minna Merritts, MD    Allergies Patient has no known allergies.  Family History  Problem Relation Age of Onset  . Congestive Heart Failure Mother   . Diabetes Mother   . Congestive Heart Failure Brother     Social History Social History   Tobacco Use  . Smoking status: Current Every Day Smoker    Packs/day: 0.50    Types: Cigarettes  . Smokeless tobacco: Never Used  Substance Use Topics  . Alcohol use: Not Currently    Frequency: Never  . Drug use: Not on file    Review of Systems  Review of Systems  Constitutional: Positive for fatigue. Negative for chills and fever.  HENT: Negative for sore throat.   Respiratory: Negative for shortness of breath.   Cardiovascular: Negative for chest pain.  Gastrointestinal: Positive for abdominal pain, nausea and vomiting.  Genitourinary: Negative for flank pain.  Musculoskeletal: Negative for neck pain.  Skin: Negative for rash and wound.  Allergic/Immunologic: Negative for immunocompromised state.  Neurological: Positive for weakness and light-headedness. Negative for numbness.  Hematological: Does not bruise/bleed easily.     ____________________________________________  PHYSICAL EXAM:      VITAL SIGNS: ED Triage Vitals  Enc Vitals Group     BP 09/22/18 1333 112/70     Pulse Rate 09/22/18 1333 71     Resp 09/22/18 1333 16     Temp 09/22/18 1333 98.1 F (36.7 C)     Temp Source 09/22/18 1333 Oral      SpO2 09/22/18 1333 98 %     Weight 09/22/18 1347 130 lb (59 kg)     Height 09/22/18 1347 5\' 11"  (1.803 m)     Head Circumference --      Peak Flow --      Pain Score 09/22/18 1347 7     Pain Loc --      Pain Edu? --      Excl. in [place]? --      Physical Exam Vitals signs and nursing note reviewed.  Constitutional:      General: He is not in acute distress.    Appearance: He is well-developed.  HENT:     Head: Normocephalic and atraumatic.     Comments: Dry mucous membranes Eyes:     Conjunctiva/sclera: Conjunctivae  normal.  Neck:     Musculoskeletal: Neck supple.  Cardiovascular:     Rate and Rhythm: Normal rate and regular rhythm.     Heart sounds: Normal heart sounds. No murmur. No friction rub.  Pulmonary:     Effort: Pulmonary effort is normal. No respiratory distress.     Breath sounds: Normal breath sounds. No wheezing or rales.  Abdominal:     General: There is no distension.     Palpations: Abdomen is soft.     Tenderness: There is no abdominal tenderness.     Comments: Mildly hyperactive bowel sounds.  No guarding or rebound.  Skin:    General: Skin is warm.     Capillary Refill: Capillary refill takes less than 2 seconds.  Neurological:     Mental Status: He is alert and oriented to person, place, and time.     Motor: No abnormal muscle tone.       ____________________________________________   LABS (all labs ordered are listed, but only abnormal results are displayed)  Labs Reviewed  COMPREHENSIVE METABOLIC PANEL - Abnormal; Notable for the following components:      Result Value   Sodium 129 (*)    Chloride 86 (*)    Glucose, Bld 717 (*)    BUN 51 (*)    Creatinine, Ser 1.69 (*)    Albumin 3.3 (*)    GFR calc non Af Amer 43 (*)    GFR calc Af Amer 49 (*)    Anion gap 20 (*)    All other components within normal limits  CBC - Abnormal; Notable for the following components:   HCT 38.4 (*)    All other components within normal limits   URINALYSIS, COMPLETE (UACMP) WITH MICROSCOPIC - Abnormal; Notable for the following components:   Color, Urine YELLOW (*)    APPearance CLEAR (*)    Glucose, UA >=500 (*)    Ketones, ur 20 (*)    Protein, ur 100 (*)    All other components within normal limits  BLOOD GAS, VENOUS - Abnormal; Notable for the following components:   Bicarbonate 28.4 (*)    Acid-Base Excess 2.5 (*)    All other components within normal limits  GLUCOSE, CAPILLARY - Abnormal; Notable for the following components:   Glucose-Capillary >600 (*)    All other components within normal limits  GLUCOSE, CAPILLARY - Abnormal; Notable for the following components:   Glucose-Capillary >600 (*)    All other components within normal limits  GLUCOSE, CAPILLARY - Abnormal; Notable for the following components:   Glucose-Capillary 490 (*)    All other components within normal limits  SARS CORONAVIRUS 2 (HOSPITAL ORDER, [facility])  LIPASE, BLOOD    ____________________________________________  EKG: Normal sinus rhythm, VR 73. PR 162, QRS 116, QTc 480. Non-specific ST changes, no ST elevations.   EKG Interpretation  Date/Time:  Friday September 22 2018 13:48:21 EDT Ventricular Rate:  73 PR Interval:  162 QRS Duration: 116 QT Interval:  436 QTC Calculation: 480 R Axis:   71 Text Interpretation:  Normal sinus rhythm Possible Left atrial enlargement Left ventricular hypertrophy with QRS widening Nonspecific ST abnormality Prolonged QT Abnormal ECG When compared with ECG of 07-Aug-2018 15:46, PREVIOUS ECG IS PRESENT ----------unconfirmed---------- Confirmed by OVERREAD, NOT ([id number]), editor Helene Kelp ([id number]) on 09/25/2018 12:27:54 PM       ________________________________________  RADIOLOGY All imaging, including plain films, CT scans, and ultrasounds, independently reviewed by me, and interpretations  confirmed via formal radiology reads.  ED MD interpretation:   Normal sinus  rhythm, LVH.  J-point elevation.  No acute ST or T-segment elevations or depressions.  Official radiology report(s): No results found.  ____________________________________________  PROCEDURES   Procedure(s) performed (including Critical Care):  .Critical Care Performed by: Duffy Bruce, MD Authorized by: Duffy Bruce, MD   Critical care provider statement:    Critical care time (minutes):  35   Critical care time was exclusive of:  Separately billable procedures and treating other patients and teaching time   Critical care was necessary to treat or prevent imminent or life-threatening deterioration of the following conditions:  Cardiac failure, circulatory failure and metabolic crisis   Critical care was time spent personally by me on the following activities:  Development of treatment plan with patient or surrogate, discussions with consultants, evaluation of patient's response to treatment, examination of patient, obtaining history from patient or surrogate, ordering and performing treatments and interventions, ordering and review of laboratory studies, ordering and review of radiographic studies, pulse oximetry, re-evaluation of patient's condition and review of old charts   I assumed direction of critical care for this patient from another provider in my specialty: no      ____________________________________________  INITIAL IMPRESSION / MDM / [place] / ED COURSE  As part of my medical decision making, I reviewed the following data within the [place] was evaluated in Emergency Department on 09/22/2018 for the symptoms described in the history of present illness. He was evaluated in the context of the global COVID-19 pandemic, which necessitated consideration that the patient might be at risk for infection with the SARS-CoV-2 virus that causes COVID-19. Institutional protocols and algorithms that pertain to the  evaluation of patients at risk for COVID-19 are in a state of rapid change based on information released by regulatory bodies including the CDC and federal and state organizations. These policies and algorithms were followed during the patient's care in the ED.  Some ED evaluations and interventions may be delayed as a result of limited staffing during the pandemic.*   Clinical Course as of Sep 22 1747  Fri Sep 21, 2268  2975 63 year old male here with generalized weakness, nausea, vomiting.  Lab work she has suspected early DKA with glucose 717, anion gap 20.  BUN and creatinine are both elevated, likely secondary to prerenal AKI.  Will be cautious with fluids in the setting of cardiomyopathy with systolic heart failure.  20 ketones noted in urine.  However, bicarb is normal, making severe DKA unlikely and patient is mentating well.  I have added on a blood gas, will start on glucose stabilizer, and cautious fluids.   [CI]  [place].   [CI]  1537 No abd TTP on exam, no fever, do not feel CT imaging needed at this time.   [CI]    Clinical Course User Index [CI] Duffy Bruce, MD    Medical Decision Making:   ____________________________________________  FINAL CLINICAL IMPRESSION(S) / ED DIAGNOSES  Final diagnoses:  Diabetic ketoacidosis without coma associated with type 2 diabetes mellitus ([place])     MEDICATIONS GIVEN DURING THIS VISIT:  Medications  dextrose 5 %-0.45 % sodium chloride infusion (has no administration in time range)  insulin regular, human (MYXREDLIN) 100 units/ 100 mL infusion (8.6 Units/hr Intravenous Rate/Dose Change 09/22/18 1721)  0.9 %  sodium chloride infusion ( Intravenous Rate/Dose Verify 09/22/18 1728)  sodium chloride 0.9 % bolus 250 mL (0 mLs Intravenous Stopped 09/22/18 1530)     ED Discharge Orders    None       Note:  This document was prepared using Dragon voice recognition software and may include unintentional dictation  errors.   Duffy Bruce, MD 09/22/18 Darci Current    Duffy Bruce, MD 10/17/18 Erskin Burnet    Duffy Bruce, MD 11/01/18 2044

## 2018-09-22 NOTE — ED Triage Notes (Signed)
Patient states he thinks he ate some bad food on Sunday. Reports several episodes of vomiting on Sunday. States he has had intermittent abdominal pain since then. States he ate a late dinner on Wednesday and went to bed and Thursday morning woke up with worsening abdominal pain and 1 episode of vomiting. Patient describes pain as "burning" and states he can "taste acid". Denies history of acid reflux.

## 2018-09-23 DIAGNOSIS — E7251 Non-ketotic hyperglycinemia: Secondary | ICD-10-CM | POA: Diagnosis not present

## 2018-09-23 LAB — BASIC METABOLIC PANEL
Anion gap: 8 (ref 5–15)
Anion gap: 8 (ref 5–15)
Anion gap: 9 (ref 5–15)
BUN: 45 mg/dL — ABNORMAL HIGH (ref 8–23)
BUN: 46 mg/dL — ABNORMAL HIGH (ref 8–23)
BUN: 46 mg/dL — ABNORMAL HIGH (ref 8–23)
CO2: 28 mmol/L (ref 22–32)
CO2: 29 mmol/L (ref 22–32)
CO2: 30 mmol/L (ref 22–32)
Calcium: 8.7 mg/dL — ABNORMAL LOW (ref 8.9–10.3)
Calcium: 8.7 mg/dL — ABNORMAL LOW (ref 8.9–10.3)
Calcium: 8.8 mg/dL — ABNORMAL LOW (ref 8.9–10.3)
Chloride: 97 mmol/L — ABNORMAL LOW (ref 98–111)
Chloride: 98 mmol/L (ref 98–111)
Chloride: 98 mmol/L (ref 98–111)
Creatinine, Ser: 1.15 mg/dL (ref 0.61–1.24)
Creatinine, Ser: 1.22 mg/dL (ref 0.61–1.24)
Creatinine, Ser: 1.22 mg/dL (ref 0.61–1.24)
GFR calc Af Amer: >60 mL/min (ref >60)
GFR calc Af Amer: >60 mL/min (ref >60)
GFR calc Af Amer: >60 mL/min (ref >60)
GFR calc non Af Amer: >60 mL/min (ref >60)
GFR calc non Af Amer: >60 mL/min (ref >60)
GFR calc non Af Amer: >60 mL/min (ref >60)
Glucose, Bld: 123 mg/dL — ABNORMAL HIGH (ref 70–99)
Glucose, Bld: 148 mg/dL — ABNORMAL HIGH (ref 70–99)
Glucose, Bld: 83 mg/dL (ref 70–99)
Potassium: 4 mmol/L (ref 3.5–5.1)
Potassium: 4.1 mmol/L (ref 3.5–5.1)
Potassium: 4.1 mmol/L (ref 3.5–5.1)
Sodium: 134 mmol/L — ABNORMAL LOW (ref 135–145)
Sodium: 135 mmol/L (ref 135–145)
Sodium: 136 mmol/L (ref 135–145)

## 2018-09-23 LAB — CBC
HCT: 32.6 % — ABNORMAL LOW (ref 39.0–52.0)
Hemoglobin: 11.1 g/dL — ABNORMAL LOW (ref 13.0–17.0)
MCH: 29.9 pg (ref 26.0–34.0)
MCHC: 34 g/dL (ref 30.0–36.0)
MCV: 87.9 fL (ref 80.0–100.0)
Platelets: 343 10*3/uL (ref 150–400)
RBC: 3.71 MIL/uL — ABNORMAL LOW (ref 4.22–5.81)
RDW: 13.5 % (ref 11.5–15.5)
WBC: 7.5 10*3/uL (ref 4.0–10.5)
nRBC: 0 % (ref 0.0–0.2)

## 2018-09-23 LAB — GLUCOSE, CAPILLARY
Glucose-Capillary: 105 mg/dL — ABNORMAL HIGH (ref 70–99)
Glucose-Capillary: 126 mg/dL — ABNORMAL HIGH (ref 70–99)
Glucose-Capillary: 175 mg/dL — ABNORMAL HIGH (ref 70–99)
Glucose-Capillary: 242 mg/dL — ABNORMAL HIGH (ref 70–99)
Glucose-Capillary: 75 mg/dL (ref 70–99)
Glucose-Capillary: 87 mg/dL (ref 70–99)

## 2018-09-23 LAB — MAGNESIUM: Magnesium: 2.1 mg/dL (ref 1.7–2.4)

## 2018-09-23 LAB — HEMOGLOBIN A1C
Hgb A1c MFr Bld: 12.4 % — ABNORMAL HIGH (ref 4.8–5.6)
Mean Plasma Glucose: 309.18 mg/dL

## 2018-09-23 LAB — PHOSPHORUS: Phosphorus: 2.7 mg/dL (ref 2.5–4.6)

## 2018-09-23 MED ORDER — INSULIN ASPART 100 UNIT/ML ~~LOC~~ SOLN
0.0000 [IU] | SUBCUTANEOUS | Status: DC
  Administered 2018-09-23: 5 [IU] via SUBCUTANEOUS
  Administered 2018-09-23: 3 [IU] via SUBCUTANEOUS
  Administered 2018-09-23: 2 [IU] via SUBCUTANEOUS
  Filled 2018-09-23 (×3): qty 1

## 2018-09-23 MED ORDER — INSULIN GLARGINE 100 UNIT/ML ~~LOC~~ SOLN
8.0000 [IU] | Freq: Every day | SUBCUTANEOUS | Status: DC
  Administered 2018-09-23: 8 [IU] via SUBCUTANEOUS
  Filled 2018-09-23 (×2): qty 0.08

## 2018-09-23 MED ORDER — SODIUM CHLORIDE 0.9 % IV SOLN
Freq: Once | INTRAVENOUS | Status: AC
  Administered 2018-09-23: 05:00:00 via INTRAVENOUS

## 2018-09-23 NOTE — Discharge Summary (Signed)
[facility] NAME: Shawn [name]    MR#:  [id number]  DATE OF BIRTH:  [birth year]  DATE OF ADMISSION:  09/22/2018 ADMITTING PHYSICIAN: Vaughan Basta, MD  DATE OF DISCHARGE: 09/23/2018  PRIMARY CARE PHYSICIAN: [facility]    ADMISSION DIAGNOSIS:  Diabetic ketoacidosis without coma associated with type 2 diabetes mellitus ([place]) [E11.10]  DISCHARGE DIAGNOSIS:  Principal Problem:   Nonketotic hyperglycinemia ([place]) Active Problems:   Nonketotic hypoglycemia   SECONDARY DIAGNOSIS:   Past Medical History:  Diagnosis Date  . Chronic combined systolic (congestive) and diastolic (congestive) heart failure ([place])    a. TTE 6/15: EF < 20%, mildly dilated LV, DD, mildly dilated LA, mod dilated RA, mild MR, mild to mod TR, mod increased posterior wall thickness, elevated LA and LVEDP; b. 4/12019 Echo: EF 20-25%, diff HK. Gr1 DD, nl RV fxn.  . CKD (chronic kidney disease), stage II   . Diabetes mellitus with complication ([place])    a. Prior admissions w/ DKA (last 12/2017).  . Essential hypertension   . NICM (nonischemic cardiomyopathy) ([place])    a. 06/2013 Echo: EF<20%; b. 06/2013 Cath: no signif dzs; c. 04/2017 Echo: 20-25%, gr1 DD.; d.05/2018 Echo: 25-30%  . Polysubstance abuse ([place])    a. etoh and tobacco  . Stroke [facility])     HOSPITAL COURSE:   63 year old male with past medical history of chronic combined systolic diastolic CHF, CKD stage II, diabetes, hypertension, nonischemic cardiomyopathy EF of 20%, polysubstance abuse, history of CVA who presented to the hospital due to severe hyperglycemia nonketotic hyperosmolar state.  1. Non-ketototic hyperglycemia-secondary to patient's noncompliance.  Patient was apparently drinking a lot of sugary beverages prior to coming to the hospital.  Blood sugars were over 600. - Patient was admitted to the hospital given IV insulin and also IV fluids. -After getting aggressive IV  fluids and insulin patient's blood sugars have significantly improved.  He has no nausea or vomiting and tolerating p.o. well.  Patient was strongly advised to abstain from drinking sugary beverages and being compliant with his insulin.  2.  Uncontrolled diabetes with hyperglycemia- due to patient's noncompliance.  Patient's A1c was 12. - Patient was strongly advised to follow a diabetic regimen and be compliant with his insulin. -At present patient will be discharged back on his Lantus and NovoLog with meals.  [facility]  3.  Acute on chronic renal failure- secondary to the hyperglycemia and mild dehydration. -Improved and resolved with IV fluid hydration.  Creatinine is close to baseline now.  4.  Chronic combined systolic/diastolic CHF. -Clinically well in the hospital patient was not in congestive heart failure.  He will continue his maintenance meds as shown below.  5.  Hyperlipidemia-patient will continue his atorvastatin.  DISCHARGE CONDITIONS:   Full code  CONSULTS OBTAINED:  Treatment Team:  [facility], [facility], MD  DRUG ALLERGIES:  No Known Allergies  DISCHARGE MEDICATIONS:   Allergies as of 09/23/2018   No Known Allergies     Medication List    TAKE these medications   aspirin 81 MG chewable tablet Chew 1 tablet (81 mg total) by mouth daily.   atorvastatin 40 MG tablet Commonly known as: LIPITOR Take 0.5 tablets (20 mg total) by mouth at bedtime.   B-Complex-C Tabs Take 1 tablet by mouth daily.   baclofen 10 MG tablet Commonly known as: LIORESAL Take 10 mg by mouth  2 (two) times daily as needed for muscle spasms. Muscle spasm   carvedilol 12.5 MG tablet Commonly known as: COREG TAKE 1 TABLET BY MOUTH TWICE A DAY WITH A MEAL   furosemide 40 MG tablet Commonly known as: LASIX Take 40 mg by mouth daily.   hydrALAZINE 100 MG tablet Commonly known as: APRESOLINE Take 100 mg by mouth 3 (three)  times daily.   insulin aspart 100 UNIT/ML injection Commonly known as: novoLOG Inject 5 Units into the skin 3 (three) times daily with meals.   insulin glargine 100 UNIT/ML injection Commonly known as: LANTUS Inject 15 Units into the skin at bedtime.   metolazone 5 MG tablet Commonly known as: ZAROXOLYN Take 1 tablet (5 mg) by mouth x 1 dose, 30 minutes prior to morning lasix on days your weight is 150 lbs or above What changed: additional instructions   potassium chloride SA 20 MEQ tablet Commonly known as: K-DUR Take 20 mEq by mouth daily.   sacubitril-valsartan 97-103 MG Commonly known as: ENTRESTO Take 1 tablet by mouth 2 (two) times daily.   spironolactone 25 MG tablet Commonly known as: ALDACTONE Take 25 mg by mouth at bedtime.         DISCHARGE INSTRUCTIONS:   DIET:  Cardiac diet and Diabetic diet  DISCHARGE CONDITION:  Stable  ACTIVITY:  Activity as tolerated  OXYGEN:  Home Oxygen: No.   Oxygen Delivery: room air  DISCHARGE LOCATION:  home   If you experience worsening of your admission symptoms, develop shortness of breath, life threatening emergency, suicidal or homicidal thoughts you must seek medical attention immediately by calling 911 or calling your MD immediately  if symptoms less severe.  You Must read complete instructions/literature along with all the possible adverse reactions/side effects for all the Medicines you take and that have been prescribed to you. Take any new Medicines after you have completely understood and accpet all the possible adverse reactions/side effects.   Please note  You were cared for by a hospitalist during your hospital stay. If you have any questions about your discharge medications or the care you received while you were in the hospital after you are discharged, you can call the unit and asked to speak with the hospitalist on call if the hospitalist that took care of you is not available. Once you are discharged,  your primary care physician will handle any further medical issues. Please note that NO REFILLS for any discharge medications will be authorized once you are discharged, as it is imperative that you return to your primary care physician (or establish a relationship with a primary care physician if you do not have one) for your aftercare needs so that they can reassess your need for medications and monitor your lab values.     Today   Nausea or vomiting, blood sugar significantly improved.  No other acute complaints presently.  Patient wants to go home.  VITAL SIGNS:  Blood pressure 98/60, pulse 67, temperature 98 F (36.7 C), temperature source Oral, resp. rate 14, height 5\' 11"  (1.803 m), weight 59 kg, SpO2 95 %.  I/O:    Intake/Output Summary (Last 24 hours) at 09/23/2018 1350 Last data filed at 09/23/2018 0800 Gross per 24 hour  Intake 779.48 ml  Output 675 ml  Net 104.48 ml    PHYSICAL EXAMINATION:  GENERAL:  63 y.o.-year-old patient lying in the bed with no acute distress.  EYES: Pupils equal, round, reactive to light and accommodation. No scleral icterus. Extraocular muscles  intact.  HEENT: Head atraumatic, normocephalic. Oropharynx and nasopharynx clear.  NECK:  Supple, no jugular venous distention. No thyroid enlargement, no tenderness.  LUNGS: Normal breath sounds bilaterally, no wheezing, rales,rhonchi. No use of accessory muscles of respiration.  CARDIOVASCULAR: S1, S2 normal. No murmurs, rubs, or gallops.  ABDOMEN: Soft, non-tender, non-distended. Bowel sounds present. No organomegaly or mass.  EXTREMITIES: No pedal edema, cyanosis, or clubbing.  NEUROLOGIC: Cranial nerves II through XII are intact. No focal motor or sensory defecits b/l.  PSYCHIATRIC: The patient is alert and oriented x 3.  SKIN: No obvious rash, lesion, or ulcer.   DATA REVIEW:   CBC Recent Labs  Lab 09/23/18 0427  WBC 7.5  HGB 11.1*  HCT 32.6*  PLT 343    Chemistries  Recent Labs  Lab  09/22/18 1351 09/23/18 0016  09/23/18 0427  NA 129* 136   < > 135  K 4.9 4.0   < > 4.1  CL 86* 98   < > 98  CO2 23 30   < > 29  GLUCOSE 717* 83   < > 148*  BUN 51* 45*   < > 46*  CREATININE 1.69* 1.22   < > 1.22  CALCIUM 9.2 8.8*   < > 8.7*  MG  --  2.1  --   --   AST 27  --   --   --   ALT 24  --   --   --   ALKPHOS 122  --   --   --   BILITOT 1.2  --   --   --    < > = values in this interval not displayed.    Cardiac Enzymes No results for input(s): TROPONINI in the last 168 hours.  Microbiology Results  Results for orders placed or performed during the hospital encounter of 09/22/18  SARS Coronavirus 2 [facility] order, Performed in [facility] hospital lab) Nasopharyngeal Nasopharyngeal Swab     Status: None   Collection Time: 09/22/18  3:10 PM   Specimen: Nasopharyngeal Swab  Result Value Ref Range Status   SARS Coronavirus 2 NEGATIVE NEGATIVE Final    Comment: (NOTE) If result is NEGATIVE SARS-CoV-2 target nucleic acids are NOT DETECTED. The SARS-CoV-2 RNA is generally detectable in upper and lower  respiratory specimens during the acute phase of infection. The lowest  concentration of SARS-CoV-2 viral copies this assay can detect is 250  copies / mL. A negative result does not preclude SARS-CoV-2 infection  and should not be used as the sole basis for treatment or other  patient management decisions.  A negative result may occur with  improper specimen collection / handling, submission of specimen other  than nasopharyngeal swab, presence of viral mutation(s) within the  areas targeted by this assay, and inadequate number of viral copies  (<250 copies / mL). A negative result must be combined with clinical  observations, patient history, and epidemiological information. If result is POSITIVE SARS-CoV-2 target nucleic acids are DETECTED. The SARS-CoV-2 RNA is generally detectable in upper and lower  respiratory specimens dur ing the acute phase of infection.   Positive  results are indicative of active infection with SARS-CoV-2.  Clinical  correlation with patient history and other diagnostic information is  necessary to determine patient infection status.  Positive results do  not rule out bacterial infection or co-infection with other viruses. If result is PRESUMPTIVE POSTIVE SARS-CoV-2 nucleic acids MAY BE PRESENT.   A presumptive positive result was obtained  on the submitted specimen  and confirmed on repeat testing.  While 2019 novel coronavirus  (SARS-CoV-2) nucleic acids may be present in the submitted sample  additional confirmatory testing may be necessary for epidemiological  and / or clinical management purposes  to differentiate between  SARS-CoV-2 and other Sarbecovirus currently known to infect humans.  If clinically indicated additional testing with an alternate test  methodology [phone]) is advised. The SARS-CoV-2 RNA is generally  detectable in upper and lower respiratory sp ecimens during the acute  phase of infection. The expected result is Negative. Fact Sheet for Patients:  StrictlyIdeas.no Fact Sheet for Healthcare Providers: BankingDealers.co.za This test is not yet approved or cleared by the [place] FDA and has been authorized for detection and/or diagnosis of SARS-CoV-2 by FDA under an Emergency Use Authorization (EUA).  This EUA will remain in effect (meaning this test can be used) for the duration of the COVID-19 declaration under Section 564(b)(1) of the Act, 21 U.S.C. section 360bbb-3(b)(1), unless the authorization is terminated or revoked sooner. Performed at [facility], [address] [place], [place] [address]   MRSA PCR Screening     Status: None   Collection Time: 09/22/18  7:41 PM   Specimen: Nasopharyngeal  Result Value Ref Range Status   MRSA by PCR NEGATIVE NEGATIVE Final    Comment:        The GeneXpert MRSA Assay (FDA approved  for NASAL specimens only), is one component of a comprehensive MRSA colonization surveillance program. It is not intended to diagnose MRSA infection nor to guide or monitor treatment for MRSA infections. Performed at [facility], [address], [place], [place] [address]     RADIOLOGY:  No results found.    Management plans discussed with the patient, family and they are in agreement.  CODE STATUS:     Code Status Orders  (From admission, onward)         Start     Ordered   09/22/18 1947  Full code  Continuous     09/22/18 1946          TOTAL TIME TAKING CARE OF THIS PATIENT: 40 minutes.    Henreitta Leber M.D on 09/23/2018 at 1:50 PM  Between 7am to 6pm - Pager - [phone]  After 6pm go to [email or web address] - [facility]  Office  [phone]  CC: Primary care physician; [facility], [place]

## 2018-09-23 NOTE — Progress Notes (Signed)
Pt was ambulated around circle, no complaints of dizziness, vs stable. Pt returned to room sitting in chair and eating lunch

## 2018-09-23 NOTE — Progress Notes (Addendum)
Inpatient Diabetes Program Recommendations  AACE/ADA: New Consensus Statement on Inpatient Glycemic Control (2015)  Target Ranges:  Prepandial:   less than 140 mg/dL      Peak postprandial:   less than 180 mg/dL (1-2 hours)      Critically ill patients:  140 - 180 mg/dL   Lab Results  Component Value Date   GLUCAP 175 (H) 09/23/2018   HGBA1C 13.5 (H) 08/08/2018    Review of Glycemic Control Results for REAL, VANDENBOSCH (MRN [id number]) as of 09/23/2018 08:57  Ref. Range 09/23/2018 03:54 09/23/2018 07:44  Glucose-Capillary Latest Ref Range: 70 - 99 mg/dL 126 (H) 175 (H)   Outpatient Diabetes medications: Lantus 15 units QHS, Novolog 5 units TID with meals Current orders for Inpatient glycemic control: Lantus 8 units QHS, Novolog 0-15 units TID with meals  Inpatient Diabetes Program Recommendations:   Insulin - Meal Coverage: Please consider ordering Novolog 3 units TID with meals for meal coverage if patient eats at least 50% of meals.  HgbA1C: Current A1C in process. Last A1C in chart 13.5% on 08/2018.  NOTE: Noted consult for Diabetes Coordinator. Patient was inpatient DKA and was seen by Inpatient Diabetes Coordinator on 03/21/18 & 08/08/2018.   Spoke with patient regarding diabetes management. Patient is followed by endocrinologist through the [facility] in [place]; Patient has an appointment in the next month. Denies missing doses and verified home medications.  Reviewed patient's current A1c of 12.4%. Explained what a A1c is and what it measures. Also reviewed goal A1c with patient, importance of good glucose control @ home, and blood sugar goals. Reviewed patho of DM, need for insulin, role of pancreas, admitting CBG, vascular changes and commorbidities.  Patient reports checking CBGs at home ranging 250-350 mg/dL. Admits to drinking large amount of sugary beverages. Counseled on the need to be mindful of carb intake and the impact this has to overall glycemic control. Additionally,  reviewed when to call the ENDO.  Patient states, "This is something I think I need to get better at." Reviewed alternatives, enocuraged to be mindful and stressed the importance of taking medications at prescribed. Patient has no further questions at this time.    Thanks, Bronson Curb, MSN, RNC-OB Diabetes Coordinator [phone] (8a-5p)

## 2018-09-23 NOTE — Progress Notes (Signed)
Pt has remained alert and oriented with no c/o pain. Pt has remained on RA, SpO2 > 90%, lung sounds clear to auscultation, NDN. Pt has remained in NSR on cardiac monitor. BP soft, HR WNL. Pt and wife have been provided with the discharge AVS and education materials regarding proper dietary choices with DM management. IVs x 2 removed. Pt with orders to discharge to home.

## 2018-09-26 ENCOUNTER — Ambulatory Visit (INDEPENDENT_AMBULATORY_CARE_PROVIDER_SITE_OTHER): Payer: Medicaid Other | Admitting: Cardiovascular Disease

## 2018-09-26 ENCOUNTER — Encounter: Payer: Self-pay | Admitting: Cardiovascular Disease

## 2018-09-26 ENCOUNTER — Other Ambulatory Visit: Payer: Self-pay

## 2018-09-26 VITALS — BP 124/66 | HR 83 | Ht 71.0 in | Wt 129.0 lb

## 2018-09-26 DIAGNOSIS — E118 Type 2 diabetes mellitus with unspecified complications: Secondary | ICD-10-CM | POA: Diagnosis not present

## 2018-09-26 DIAGNOSIS — I5022 Chronic systolic (congestive) heart failure: Secondary | ICD-10-CM

## 2018-09-26 DIAGNOSIS — I10 Essential (primary) hypertension: Secondary | ICD-10-CM | POA: Diagnosis not present

## 2018-09-26 DIAGNOSIS — Z794 Long term (current) use of insulin: Secondary | ICD-10-CM | POA: Diagnosis not present

## 2018-09-26 MED ORDER — ATORVASTATIN CALCIUM 40 MG PO TABS: 20.0000 mg | ORAL_TABLET | Freq: Every day | ORAL | 1 refills | Status: DC

## 2018-09-26 MED ORDER — POTASSIUM CHLORIDE CRYS ER 20 MEQ PO TBCR: 20.0000 meq | EXTENDED_RELEASE_TABLET | Freq: Every day | ORAL | 1 refills | Status: DC

## 2018-09-26 MED ORDER — FUROSEMIDE 40 MG PO TABS: 40.0000 mg | ORAL_TABLET | Freq: Every day | ORAL | 1 refills | Status: DC

## 2018-09-26 MED ORDER — CARVEDILOL 12.5 MG PO TABS: ORAL_TABLET | ORAL | 1 refills | Status: DC

## 2018-09-26 NOTE — Patient Instructions (Addendum)
Referral to endocrinology at [facility] Call:    Medication Instructions:  No changes  If you need a refill on your cardiac medications before your next appointment, please call your pharmacy.    Lab work: No new labs needed   If you have labs (blood work) drawn today and your tests are completely normal, you will receive your results only by: Marland Kitchen MyChart Message (if you have MyChart) OR . A paper copy in the mail If you have any lab test that is abnormal or we need to change your treatment, we will call you to review the results.   Testing/Procedures: No new testing needed   Follow-Up: At [facility], you and your health needs are our priority.  As part of our continuing mission to provide you with exceptional heart care, we have created designated Provider Care Teams.  These Care Teams include your primary Cardiologist (physician) and Advanced Practice Providers (APPs -  Physician Assistants and Nurse Practitioners) who all work together to provide you with the care you need, when you need it.  . You will need a follow up appointment in 6 months .   Please call our office 2 months in advance to schedule this appointment.    . Providers on your designated Care Team:   . Murray Hodgkins, NP . Christell Faith, PA-C . Marrianne Mood, PA-C  Any Other Special Instructions Will Be Listed Below (If Applicable).  For educational health videos Log in to : [email or web address] Or : [email or web address], password : [email or web address]

## 2018-09-26 NOTE — Progress Notes (Signed)
Cardiology Office Note  Date:  09/26/2018   ID:  Shawn [name], DOB [birth year], MRN [id number]  PCP:  [facility]   Chief Complaint  Patient presents with  . Other    ED follow up. Patient c/o swelling in legs and feet.  Patient denies chest pain and SOB at this time. Meds reviewed verbally with patient.     HPI:  63 year old gentleman with past medical history of  nonischemic cardiomyopathy, ejection fraction less than 20% dating back to 2015 Prior catheterization June 2015, poorly controlled insulin-dependent diabetes,  prior strokes,  polysubstance abuse including alcohol,  smoking 1 pack/day who continues to smoke,  Recent hospitalization forDKA hemoglobin A1c greater than 15 Started on insulin infusion Chronic mild to moderate bilateral lower extremity swelling which typically worsens throughout the day and is improved in the morning. Who presents for follow-up of his nonischemic dilated cardiomyopathy, heart failure  On lasix 40 in the Am Denies significant shortness of breath, no leg edema  [place], had IV fluids Feels better  Weight down 129 pounds, was 151 in early 2020 Last year was 129 Etiology of weight loss unclear, eating less  Lab work reviewed with him HBA1C 12.4 Smoking, 10 a day, coming down  EKG personally reviewed by myself on todays visit NSR sinus rhythm rate 83 bpm  Other past medical history reviewed Echocardiogram performed yesterday reviewed personally by myself, images pulled up in the office,  showing persistent ejection fraction 25%, global hypokinesis Unable to accurately estimate right ventricular systolic pressure   In December 2019 he was admitted to [facility] in the setting of DKA.  He is followed by primary care at the [facility]   Weight typically fluctuates between 138 and 145 at home based on prior clinic visit March 14, 2018 previously eating out every day for  lunch  Following that visit in our clinic he presented to the hospital with hypoglycemia and altered mental status March 21, 2018 Notes indicating he had not been taking his insulin, was more confused Hypertensive on arrival to the emergency room systolic pressure 83 Creatinine 2.54 BUN 71 He was hypothermic temperature 93 degrees, and with broad-spectrum antibiotics Blood pressure improved with IV fluids Renal failure improved   PMH:   has a past medical history of Chronic combined systolic (congestive) and diastolic (congestive) heart failure ([place]), CKD (chronic kidney disease), stage II, Diabetes mellitus with complication ([place]), Essential hypertension, NICM (nonischemic cardiomyopathy) ([place]), Polysubstance abuse ([place]), and Stroke ([place]).  PSH:    Past Surgical History:  Procedure Laterality Date  . NO PAST SURGERIES      Current Outpatient Medications  Medication Sig Dispense Refill  . aspirin 81 MG chewable tablet Chew 1 tablet (81 mg total) by mouth daily. 30 tablet 1  . atorvastatin (LIPITOR) 40 MG tablet Take 0.5 tablets (20 mg total) by mouth at bedtime. 30 tablet 0  . B-Complex-C TABS Take 1 tablet by mouth daily.    . baclofen (LIORESAL) 10 MG tablet Take 10 mg by mouth 2 (two) times daily as needed for muscle spasms. Muscle spasm    . carvedilol (COREG) 12.5 MG tablet TAKE 1 TABLET BY MOUTH TWICE A DAY WITH A MEAL 180 tablet 0  . furosemide (LASIX) 40 MG tablet Take 40 mg by mouth daily.    . hydrALAZINE (APRESOLINE) 100 MG tablet Take 100 mg by mouth 3 (three) times daily.     . insulin aspart (  NOVOLOG) 100 UNIT/ML injection Inject 5 Units into the skin 3 (three) times daily with meals.    . insulin glargine (LANTUS) 100 UNIT/ML injection Inject 15 Units into the skin at bedtime.    . metolazone (ZAROXOLYN) 5 MG tablet Take 1 tablet (5 mg) by mouth x 1 dose, 30 minutes prior to morning lasix on days your weight is 150 lbs or above (Patient taking differently: Take 1 tablet  (5 mg) by mouth x 1 dose, 30 minutes prior to morning lasix on days your weight is 150 lbs or above as neeeded) 10 tablet 3  . potassium chloride SA (K-DUR) 20 MEQ tablet Take 20 mEq by mouth daily.    . sacubitril-valsartan (ENTRESTO) 97-103 MG Take 1 tablet by mouth 2 (two) times daily. 180 tablet 0  . spironolactone (ALDACTONE) 25 MG tablet Take 25 mg by mouth at bedtime.     No current facility-administered medications for this visit.      Allergies:   Patient has no known allergies.   Social History:  The patient  reports that he has been smoking cigarettes. He has been smoking about 0.50 packs per day. He has never used smokeless tobacco. He reports previous alcohol use.   Family History:   family history includes Congestive Heart Failure in his brother and mother; Diabetes in his mother.    Review of Systems: Review of Systems  Constitutional: Negative.   HENT: Negative.   Respiratory: Negative.   Cardiovascular: Negative.   Gastrointestinal: Negative.   Musculoskeletal: Negative.   Neurological: Negative for tingling.  Psychiatric/Behavioral: Negative.   All other systems reviewed and are negative.   PHYSICAL EXAM: VS:  BP 124/66 (BP Location: Left Arm, Patient Position: Sitting, Cuff Size: Normal)   Pulse 83   Ht 5\' 11"  (1.803 m)   Wt 129 lb (58.5 kg)   BMI 17.99 kg/m  , BMI Body mass index is 17.99 kg/m. Constitutional:  oriented to person, place, and time. No distress.  HENT:  Head: Grossly normal Eyes:  no discharge. No scleral icterus.  Neck: No JVD, no carotid bruits  Cardiovascular: Regular rate and rhythm, no murmurs appreciated Pulmonary/Chest: Clear to auscultation bilaterally, no wheezes or rails Abdominal: Soft.  no distension.  no tenderness.  Musculoskeletal: Normal range of motion Neurological:  normal muscle tone. Coordination normal. No atrophy Skin: Skin warm and dry Psychiatric: normal affect, pleasant  Recent Labs: 03/21/2018: B  Natriuretic Peptide 374.0 09/22/2018: ALT 24 09/23/2018: BUN 46; Creatinine, Ser 1.22; Hemoglobin 11.1; Magnesium 2.1; Platelets 343; Potassium 4.1; Sodium 135    Lipid Panel Lab Results  Component Value Date   CHOL 152 06/18/2013   HDL 80 (H) 06/18/2013   LDLCALC 59 06/18/2013   TRIG 67 06/18/2013      Wt Readings from Last 3 Encounters:  09/26/18 129 lb (58.5 kg)  09/22/18 130 lb (59 kg)  09/04/18 134 lb 5 oz (60.9 kg)      ASSESSMENT AND PLAN:  Nonischemic cardiomyopathy Prior catheterization June 2015, no further ischemic work-up needed Continue current meds Denies having any anginal symptoms  Type 2 diabetes mellitus with complication, with long-term current use of insulin ([place]) -  long history of poorly control diabetes, managed by the [facility] Will refer locally, endocrinology  Smoker We have encouraged him to continue to work on weaning his cigarettes and smoking cessation. He will continue to work on this and does not want any assistance with chantix.   Polysubstance abuse ([place]) Prior history  of alcohol, reports that he stopped   Total encounter time more than 25 minutes  Greater than 50% was spent in counseling and coordination of care with the patient   No orders of the defined types were placed in this encounter.    Signed, Esmond Plants, M.D., Ph.D. 09/26/2018  [place], [place]

## 2018-10-05 ENCOUNTER — Telehealth: Payer: Self-pay | Admitting: Cardiovascular Disease

## 2018-10-05 NOTE — Telephone Encounter (Signed)
Returned call to patient, spoke to wife. Gave them number for [facility] endocrinology.

## 2018-10-05 NOTE — Telephone Encounter (Signed)
Patient spouse calling Patient was referred by Dr Rockey Situ to [facility] but they have yet to hear anything for scheduling  Please advise on status

## 2018-10-06 ENCOUNTER — Telehealth: Payer: Self-pay | Admitting: Cardiovascular Disease

## 2018-10-06 NOTE — Telephone Encounter (Signed)
Patient spouse calling Wanted to make office aware they are unable to get blood sugar down  They are going to the ER now

## 2018-10-09 NOTE — Telephone Encounter (Signed)
Returned call to wife.  Per wife Pt refused to go to ER on Friday.  His BS started in the 600's, but per wife he drank water and took his insulin and finally came down to low 100's.  Wife states blood sugars were good over the weekend.  Blood sugar 135 this AM.  Pt/wife waiting for approval from medicaid before they can get into the [facility] clinic.  Advised wife to call office if any further needs.

## 2018-10-11 ENCOUNTER — Telehealth: Payer: Medicaid Other | Admitting: Cardiovascular Disease

## 2018-11-02 DIAGNOSIS — E782 Mixed hyperlipidemia: Secondary | ICD-10-CM | POA: Diagnosis not present

## 2018-11-02 DIAGNOSIS — I10 Essential (primary) hypertension: Secondary | ICD-10-CM | POA: Diagnosis not present

## 2018-11-02 DIAGNOSIS — E1165 Type 2 diabetes mellitus with hyperglycemia: Secondary | ICD-10-CM | POA: Diagnosis not present

## 2018-11-02 DIAGNOSIS — Z720 Tobacco use: Secondary | ICD-10-CM | POA: Diagnosis not present

## 2018-12-04 DIAGNOSIS — E782 Mixed hyperlipidemia: Secondary | ICD-10-CM | POA: Diagnosis not present

## 2018-12-04 DIAGNOSIS — Z720 Tobacco use: Secondary | ICD-10-CM | POA: Diagnosis not present

## 2018-12-04 DIAGNOSIS — E1165 Type 2 diabetes mellitus with hyperglycemia: Secondary | ICD-10-CM | POA: Diagnosis not present

## 2018-12-04 DIAGNOSIS — I10 Essential (primary) hypertension: Secondary | ICD-10-CM | POA: Diagnosis not present

## 2018-12-18 DIAGNOSIS — S80812A Abrasion, left lower leg, initial encounter: Secondary | ICD-10-CM | POA: Diagnosis not present

## 2018-12-18 DIAGNOSIS — K529 Noninfective gastroenteritis and colitis, unspecified: Secondary | ICD-10-CM | POA: Diagnosis not present

## 2018-12-18 DIAGNOSIS — L089 Local infection of the skin and subcutaneous tissue, unspecified: Secondary | ICD-10-CM | POA: Diagnosis not present

## 2018-12-18 DIAGNOSIS — R195 Other fecal abnormalities: Secondary | ICD-10-CM | POA: Diagnosis not present

## 2019-01-03 DIAGNOSIS — Z794 Long term (current) use of insulin: Secondary | ICD-10-CM | POA: Diagnosis not present

## 2019-01-03 DIAGNOSIS — E1165 Type 2 diabetes mellitus with hyperglycemia: Secondary | ICD-10-CM | POA: Diagnosis not present

## 2019-01-03 DIAGNOSIS — I872 Venous insufficiency (chronic) (peripheral): Secondary | ICD-10-CM | POA: Diagnosis not present

## 2019-01-03 DIAGNOSIS — I509 Heart failure, unspecified: Secondary | ICD-10-CM | POA: Diagnosis not present

## 2019-01-03 DIAGNOSIS — E782 Mixed hyperlipidemia: Secondary | ICD-10-CM | POA: Diagnosis not present

## 2019-01-03 DIAGNOSIS — E119 Type 2 diabetes mellitus without complications: Secondary | ICD-10-CM | POA: Diagnosis not present

## 2019-01-03 DIAGNOSIS — Z720 Tobacco use: Secondary | ICD-10-CM | POA: Diagnosis not present

## 2019-01-03 DIAGNOSIS — I10 Essential (primary) hypertension: Secondary | ICD-10-CM | POA: Diagnosis not present

## 2019-01-03 DIAGNOSIS — R21 Rash and other nonspecific skin eruption: Secondary | ICD-10-CM | POA: Diagnosis not present

## 2019-01-08 DIAGNOSIS — R109 Unspecified abdominal pain: Secondary | ICD-10-CM | POA: Diagnosis not present

## 2019-01-08 DIAGNOSIS — R140 Abdominal distension (gaseous): Secondary | ICD-10-CM | POA: Diagnosis not present

## 2019-01-08 DIAGNOSIS — R110 Nausea: Secondary | ICD-10-CM | POA: Diagnosis not present

## 2019-01-10 DIAGNOSIS — E119 Type 2 diabetes mellitus without complications: Secondary | ICD-10-CM | POA: Diagnosis not present

## 2019-01-10 DIAGNOSIS — R140 Abdominal distension (gaseous): Secondary | ICD-10-CM | POA: Diagnosis not present

## 2019-01-10 DIAGNOSIS — I872 Venous insufficiency (chronic) (peripheral): Secondary | ICD-10-CM | POA: Diagnosis not present

## 2019-01-10 DIAGNOSIS — R110 Nausea: Secondary | ICD-10-CM | POA: Diagnosis not present

## 2019-01-11 ENCOUNTER — Other Ambulatory Visit: Payer: Self-pay | Admitting: Family Medicine

## 2019-01-11 DIAGNOSIS — R110 Nausea: Secondary | ICD-10-CM

## 2019-01-11 DIAGNOSIS — R140 Abdominal distension (gaseous): Secondary | ICD-10-CM

## 2019-01-12 ENCOUNTER — Ambulatory Visit
Admission: RE | Admit: 2019-01-12 | Discharge: 2019-01-12 | Disposition: A | Discharge status: Home / Self Care | Payer: Medicaid Other | Source: Ambulatory Visit | Attending: Family Medicine | Admitting: Family Medicine

## 2019-01-12 ENCOUNTER — Other Ambulatory Visit: Payer: Self-pay

## 2019-01-12 DIAGNOSIS — R110 Nausea: Secondary | ICD-10-CM | POA: Diagnosis not present

## 2019-01-12 DIAGNOSIS — R188 Other ascites: Secondary | ICD-10-CM | POA: Diagnosis not present

## 2019-01-12 DIAGNOSIS — R140 Abdominal distension (gaseous): Secondary | ICD-10-CM | POA: Insufficient documentation

## 2019-01-12 MED ORDER — IOHEXOL 300 MG/ML  SOLN
80.0000 mL | Freq: Once | INTRAMUSCULAR | Status: AC | PRN
  Administered 2019-01-12: 80 mL via INTRAVENOUS

## 2019-01-17 DIAGNOSIS — R140 Abdominal distension (gaseous): Secondary | ICD-10-CM | POA: Diagnosis not present

## 2019-01-17 DIAGNOSIS — R110 Nausea: Secondary | ICD-10-CM | POA: Diagnosis not present

## 2019-01-17 DIAGNOSIS — R188 Other ascites: Secondary | ICD-10-CM | POA: Diagnosis not present

## 2019-01-17 DIAGNOSIS — R635 Abnormal weight gain: Secondary | ICD-10-CM | POA: Diagnosis not present

## 2019-01-18 ENCOUNTER — Encounter: Payer: Self-pay | Admitting: Emergency Medicine

## 2019-01-18 ENCOUNTER — Emergency Department: Payer: No Typology Code available for payment source

## 2019-01-18 ENCOUNTER — Ambulatory Visit (INDEPENDENT_AMBULATORY_CARE_PROVIDER_SITE_OTHER): Payer: Medicaid Other | Admitting: Physician Assistant

## 2019-01-18 ENCOUNTER — Inpatient Hospital Stay
Admission: EM | Admit: 2019-01-18 | Discharge: 2019-01-20 | DRG: 291 | Disposition: A | Discharge status: Home / Self Care | Payer: No Typology Code available for payment source | Attending: Family Medicine | Admitting: Family Medicine

## 2019-01-18 ENCOUNTER — Encounter: Payer: Self-pay | Admitting: Physician Assistant

## 2019-01-18 ENCOUNTER — Other Ambulatory Visit: Payer: Self-pay

## 2019-01-18 VITALS — BP 110/64 | Ht 71.0 in | Wt 170.0 lb

## 2019-01-18 DIAGNOSIS — Z20822 Contact with and (suspected) exposure to covid-19: Secondary | ICD-10-CM | POA: Diagnosis present

## 2019-01-18 DIAGNOSIS — E785 Hyperlipidemia, unspecified: Secondary | ICD-10-CM | POA: Diagnosis present

## 2019-01-18 DIAGNOSIS — R609 Edema, unspecified: Secondary | ICD-10-CM

## 2019-01-18 DIAGNOSIS — I420 Dilated cardiomyopathy: Secondary | ICD-10-CM

## 2019-01-18 DIAGNOSIS — E118 Type 2 diabetes mellitus with unspecified complications: Secondary | ICD-10-CM

## 2019-01-18 DIAGNOSIS — E119 Type 2 diabetes mellitus without complications: Secondary | ICD-10-CM

## 2019-01-18 DIAGNOSIS — D631 Anemia in chronic kidney disease: Secondary | ICD-10-CM | POA: Diagnosis present

## 2019-01-18 DIAGNOSIS — I509 Heart failure, unspecified: Secondary | ICD-10-CM

## 2019-01-18 DIAGNOSIS — Z79899 Other long term (current) drug therapy: Secondary | ICD-10-CM | POA: Diagnosis not present

## 2019-01-18 DIAGNOSIS — Z794 Long term (current) use of insulin: Secondary | ICD-10-CM

## 2019-01-18 DIAGNOSIS — Z8673 Personal history of transient ischemic attack (TIA), and cerebral infarction without residual deficits: Secondary | ICD-10-CM | POA: Diagnosis not present

## 2019-01-18 DIAGNOSIS — Z8249 Family history of ischemic heart disease and other diseases of the circulatory system: Secondary | ICD-10-CM

## 2019-01-18 DIAGNOSIS — I5043 Acute on chronic combined systolic (congestive) and diastolic (congestive) heart failure: Secondary | ICD-10-CM | POA: Diagnosis present

## 2019-01-18 DIAGNOSIS — Z7982 Long term (current) use of aspirin: Secondary | ICD-10-CM | POA: Diagnosis not present

## 2019-01-18 DIAGNOSIS — Z720 Tobacco use: Secondary | ICD-10-CM

## 2019-01-18 DIAGNOSIS — I130 Hypertensive heart and chronic kidney disease with heart failure and stage 1 through stage 4 chronic kidney disease, or unspecified chronic kidney disease: Secondary | ICD-10-CM | POA: Diagnosis present

## 2019-01-18 DIAGNOSIS — E1165 Type 2 diabetes mellitus with hyperglycemia: Secondary | ICD-10-CM | POA: Diagnosis present

## 2019-01-18 DIAGNOSIS — N183 Chronic kidney disease, stage 3 unspecified: Secondary | ICD-10-CM | POA: Diagnosis present

## 2019-01-18 DIAGNOSIS — Z833 Family history of diabetes mellitus: Secondary | ICD-10-CM | POA: Diagnosis not present

## 2019-01-18 DIAGNOSIS — E1122 Type 2 diabetes mellitus with diabetic chronic kidney disease: Secondary | ICD-10-CM | POA: Diagnosis present

## 2019-01-18 DIAGNOSIS — F17210 Nicotine dependence, cigarettes, uncomplicated: Secondary | ICD-10-CM | POA: Diagnosis present

## 2019-01-18 DIAGNOSIS — R0602 Shortness of breath: Secondary | ICD-10-CM | POA: Diagnosis present

## 2019-01-18 DIAGNOSIS — I10 Essential (primary) hypertension: Secondary | ICD-10-CM | POA: Diagnosis not present

## 2019-01-18 DIAGNOSIS — N1832 Chronic kidney disease, stage 3b: Secondary | ICD-10-CM | POA: Diagnosis not present

## 2019-01-18 DIAGNOSIS — I255 Ischemic cardiomyopathy: Secondary | ICD-10-CM | POA: Diagnosis present

## 2019-01-18 DIAGNOSIS — E1121 Type 2 diabetes mellitus with diabetic nephropathy: Secondary | ICD-10-CM | POA: Diagnosis not present

## 2019-01-18 DIAGNOSIS — I428 Other cardiomyopathies: Secondary | ICD-10-CM

## 2019-01-18 DIAGNOSIS — I429 Cardiomyopathy, unspecified: Secondary | ICD-10-CM

## 2019-01-18 DIAGNOSIS — Z9111 Patient's noncompliance with dietary regimen: Secondary | ICD-10-CM | POA: Diagnosis not present

## 2019-01-18 DIAGNOSIS — N179 Acute kidney failure, unspecified: Secondary | ICD-10-CM

## 2019-01-18 DIAGNOSIS — I5022 Chronic systolic (congestive) heart failure: Secondary | ICD-10-CM | POA: Diagnosis not present

## 2019-01-18 DIAGNOSIS — Z9119 Patient's noncompliance with other medical treatment and regimen: Secondary | ICD-10-CM | POA: Diagnosis not present

## 2019-01-18 DIAGNOSIS — Z03818 Encounter for observation for suspected exposure to other biological agents ruled out: Secondary | ICD-10-CM | POA: Diagnosis not present

## 2019-01-18 LAB — HEPATIC FUNCTION PANEL
ALT: 10 U/L (ref 0–44)
AST: 23 U/L (ref 15–41)
Albumin: 3 g/dL — ABNORMAL LOW (ref 3.5–5.0)
Alkaline Phosphatase: 90 U/L (ref 38–126)
Bilirubin, Direct: <0.1 mg/dL (ref 0.0–0.2)
Total Bilirubin: 0.8 mg/dL (ref 0.3–1.2)
Total Protein: 6.4 g/dL — ABNORMAL LOW (ref 6.5–8.1)

## 2019-01-18 LAB — GLUCOSE, CAPILLARY
Glucose-Capillary: 511 mg/dL (ref 70–99)
Glucose-Capillary: 428 mg/dL — ABNORMAL HIGH (ref 70–99)

## 2019-01-18 LAB — BASIC METABOLIC PANEL WITH GFR
Anion gap: 12 (ref 5–15)
BUN: 44 mg/dL — ABNORMAL HIGH (ref 8–23)
CO2: 27 mmol/L (ref 22–32)
Calcium: 8.8 mg/dL — ABNORMAL LOW (ref 8.9–10.3)
Chloride: 99 mmol/L (ref 98–111)
Creatinine, Ser: 2.93 mg/dL — ABNORMAL HIGH (ref 0.61–1.24)
GFR calc Af Amer: 25 mL/min — ABNORMAL LOW
GFR calc non Af Amer: 22 mL/min — ABNORMAL LOW
Glucose, Bld: 414 mg/dL — ABNORMAL HIGH (ref 70–99)
Potassium: 4.9 mmol/L (ref 3.5–5.1)
Sodium: 138 mmol/L (ref 135–145)

## 2019-01-18 LAB — HEMOGLOBIN A1C
Hgb A1c MFr Bld: 11 % — ABNORMAL HIGH (ref 4.8–5.6)
Mean Plasma Glucose: 269 mg/dL

## 2019-01-18 LAB — CBC
HCT: 35.1 % — ABNORMAL LOW (ref 39.0–52.0)
Hemoglobin: 11 g/dL — ABNORMAL LOW (ref 13.0–17.0)
MCH: 27.7 pg (ref 26.0–34.0)
MCHC: 31.3 g/dL (ref 30.0–36.0)
MCV: 88.4 fL (ref 80.0–100.0)
Platelets: 439 10*3/uL — ABNORMAL HIGH (ref 150–400)
RBC: 3.97 MIL/uL — ABNORMAL LOW (ref 4.22–5.81)
RDW: 14.5 % (ref 11.5–15.5)
WBC: 6.2 10*3/uL (ref 4.0–10.5)
nRBC: 0 % (ref 0.0–0.2)

## 2019-01-18 LAB — BRAIN NATRIURETIC PEPTIDE
B Natriuretic Peptide: 221 pg/mL — ABNORMAL HIGH (ref 0.0–100.0)
B Natriuretic Peptide: 283 pg/mL — ABNORMAL HIGH (ref 0.0–100.0)

## 2019-01-18 MED ORDER — INSULIN ASPART 100 UNIT/ML ~~LOC~~ SOLN
0.0000 [IU] | Freq: Three times a day (TID) | SUBCUTANEOUS | Status: DC
  Administered 2019-01-19: 3 [IU] via SUBCUTANEOUS
  Administered 2019-01-19: 2 [IU] via SUBCUTANEOUS
  Administered 2019-01-20: 8 [IU] via SUBCUTANEOUS
  Administered 2019-01-20: 15 [IU] via SUBCUTANEOUS
  Filled 2019-01-18 (×4): qty 1

## 2019-01-18 MED ORDER — SODIUM CHLORIDE 0.9% FLUSH: 3.0000 mL | Freq: Once | INTRAVENOUS | Status: DC

## 2019-01-18 MED ORDER — INSULIN ASPART 100 UNIT/ML ~~LOC~~ SOLN
5.0000 [IU] | Freq: Three times a day (TID) | SUBCUTANEOUS | Status: DC
  Administered 2019-01-19 – 2019-01-20 (×5): 5 [IU] via SUBCUTANEOUS
  Filled 2019-01-18 (×5): qty 1

## 2019-01-18 MED ORDER — INSULIN GLARGINE 100 UNIT/ML ~~LOC~~ SOLN
15.0000 [IU] | Freq: Every day | SUBCUTANEOUS | Status: DC
  Filled 2019-01-18: qty 0.15

## 2019-01-18 MED ORDER — ONDANSETRON HCL 4 MG/2ML IJ SOLN: 4.0000 mg | Freq: Four times a day (QID) | INTRAMUSCULAR | Status: DC | PRN

## 2019-01-18 MED ORDER — INSULIN ASPART 100 UNIT/ML ~~LOC~~ SOLN
25.0000 [IU] | Freq: Once | SUBCUTANEOUS | Status: AC
  Administered 2019-01-18: 25 [IU] via SUBCUTANEOUS
  Filled 2019-01-18: qty 1

## 2019-01-18 MED ORDER — ACETAMINOPHEN 325 MG PO TABS: 650.0000 mg | ORAL_TABLET | ORAL | Status: DC | PRN

## 2019-01-18 MED ORDER — CARVEDILOL 12.5 MG PO TABS
12.5000 mg | ORAL_TABLET | Freq: Two times a day (BID) | ORAL | Status: DC
  Administered 2019-01-18 – 2019-01-20 (×4): 12.5 mg via ORAL
  Filled 2019-01-18 (×4): qty 1

## 2019-01-18 MED ORDER — SODIUM CHLORIDE 0.9 % IV SOLN: 250.0000 mL | INTRAVENOUS | Status: DC | PRN

## 2019-01-18 MED ORDER — INSULIN ASPART 100 UNIT/ML ~~LOC~~ SOLN
15.0000 [IU] | Freq: Once | SUBCUTANEOUS | Status: AC
  Administered 2019-01-19: 15 [IU] via SUBCUTANEOUS
  Filled 2019-01-18: qty 1

## 2019-01-18 MED ORDER — HEPARIN SODIUM (PORCINE) 5000 UNIT/ML IJ SOLN
5000.0000 [IU] | Freq: Three times a day (TID) | INTRAMUSCULAR | Status: DC
  Administered 2019-01-18 – 2019-01-19 (×3): 5000 [IU] via SUBCUTANEOUS
  Filled 2019-01-18 (×3): qty 1

## 2019-01-18 MED ORDER — HYDRALAZINE HCL 50 MG PO TABS
100.0000 mg | ORAL_TABLET | Freq: Three times a day (TID) | ORAL | Status: DC
  Administered 2019-01-18 – 2019-01-20 (×6): 100 mg via ORAL
  Filled 2019-01-18 (×6): qty 2

## 2019-01-18 MED ORDER — ATORVASTATIN CALCIUM 20 MG PO TABS
20.0000 mg | ORAL_TABLET | Freq: Every day | ORAL | Status: DC
  Administered 2019-01-18 – 2019-01-19 (×2): 20 mg via ORAL
  Filled 2019-01-18 (×2): qty 1

## 2019-01-18 MED ORDER — SODIUM CHLORIDE 0.9% FLUSH: 3.0000 mL | INTRAVENOUS | Status: DC | PRN

## 2019-01-18 MED ORDER — INSULIN GLARGINE 100 UNIT/ML ~~LOC~~ SOLN
20.0000 [IU] | Freq: Every day | SUBCUTANEOUS | Status: DC
  Administered 2019-01-18: 20 [IU] via SUBCUTANEOUS
  Filled 2019-01-18 (×3): qty 0.2

## 2019-01-18 MED ORDER — SODIUM CHLORIDE 0.9% FLUSH
3.0000 mL | Freq: Two times a day (BID) | INTRAVENOUS | Status: DC
  Administered 2019-01-19 – 2019-01-20 (×2): 3 mL via INTRAVENOUS

## 2019-01-18 MED ORDER — FUROSEMIDE 10 MG/ML IJ SOLN: 40.0000 mg | Freq: Two times a day (BID) | INTRAMUSCULAR | Status: DC

## 2019-01-18 MED ORDER — INSULIN ASPART 100 UNIT/ML ~~LOC~~ SOLN: 0.0000 [IU] | Freq: Every day | SUBCUTANEOUS | Status: DC

## 2019-01-18 MED ORDER — FUROSEMIDE 10 MG/ML IJ SOLN
4.0000 mg/h | INTRAVENOUS | Status: DC
  Administered 2019-01-18: 4 mg/h via INTRAVENOUS
  Filled 2019-01-18: qty 25

## 2019-01-18 MED ORDER — FUROSEMIDE 10 MG/ML IJ SOLN
40.0000 mg | Freq: Once | INTRAMUSCULAR | Status: AC
  Administered 2019-01-18: 40 mg via INTRAVENOUS
  Filled 2019-01-18: qty 4

## 2019-01-18 NOTE — ED Notes (Signed)
Report given to Lovena Le, RN on Telemetry.

## 2019-01-18 NOTE — Progress Notes (Signed)
Attending Note Patient seen and examined, agree with detailed note above,  Patient presentation and plan discussed on rounds.  Cardiology consult placed by Dr.Sona Posey Pronto for acute on chronic systolic and diastolic CHF  EKG lab work, chest x-ray, echocardiogram reviewed independently by myself  [name] is a 64 year old gentleman with prior history nonischemic cardiomyopathy ejection fraction 25 to 30%, poorly controlled diabetes type 2, prior strokes, polysubstance abuse prior alcohol, continued smoker 10 cigarettes/day, chronic lower extremity edema, presents to the hospital after being referred from the cardiology clinic for acute on chronic systolic CHF  He reports over the past 3 to 4 weeks he has had worsening leg swelling and abdominal distention.  Eats out fast food almost every day, including contact fried chicken, sal's, [employer].  High fluid intake including G-0 and diet soda.  Reports compliance with his Lasix but states it was just not working well, minimal urine output  Denies any chest pain, He has had PND/orthopnea  Clinic weight typically high 130s to 145, prior office note September 2020 weight 130 Current weight 169.5 pounds today  On examination : alert oriented, no JVD, lungs clear to auscultation bilaterally, heart sounds regular normal S1-S2 no murmurs appreciated, abdomen soft nontender no significant lower extremity edema.  Musculoskeletal exam with good range of motion, neurologic exam grossly nonfocal  Lab work reviewed glucose 511, normal LFTs, albumin 3.0, Creatinine elevated 2.93 BUN 44, sodium 138, BNP 221 hemoglobin 11  EKG personally reviewed by myself showing Normal sinus rhythm rate 82 bpm nonspecific ST abnormality inferior leads III, aVF, V6  A/P: Acute on chronic systolic/diastolic CHF Currently not in respiratory distress Weight markedly above his baseline Diet noncompliance, eats out at fast food places daily Lots of Gatorade and diet  soda.  Chronic issue previously discussed with him in clinic --Worsening renal failure possibly cardiorenal syndrome --Agree with Lasix infusion with close monitoring of renal function  Hypertension Poorly controlled, 150s up to 170s over 100 Exacerbated by fluid overload Scheduled to receive his carvedilol, hydralazine ACE inhibitor, ARB, Entresto on hold Nitrates can be added if additional blood pressure control needed  Poorly controlled diabetes with complications Glucose levels 400-500 Reports he has missed insulin twice today To be delivered on the floor Hemoglobin A1c 12.4 suggests long history of poorly controlled diabetes.  He does admit to poor diet, fast food daily  [place] fried chicken soda cup next To his bedside  Dilated cardiomyopathy Dating back over 6 years, cardiac catheterization at that time with no significant coronary disease Ejection fraction typically 25% Last evaluated by echocardiogram May 2020 Could consider repeat study to confirm no significant change or LV thrombus    Long discussion with him concerning his diet, need to avoid eating out, need to moderate his fluid intake Greater than 50% was spent in counseling and coordination of care with patient Total encounter time 110 minutes or more   Signed: Esmond Plants  M.D., Ph.D. [facility]

## 2019-01-18 NOTE — Consult Note (Signed)
Cardiology Consultation:   Patient ID: Shawn [name] MRN: [id number]; DOB: Mar 17, 1955  Admit date: 01/18/2019 Date of Consult: 01/18/2019  Primary Care Provider: [place] Primary Cardiologist: Ida Rogue, MD  Primary Electrophysiologist:  None    Patient Profile:   Shawn [name] is a 64 y.o. male with a hx of HFrEF/NICM (EF 25-30%), poorly controlled DM2, prior CVAs, polysubstance abuse including alcohol, current tobacco use (10 cigarettes/day), chronic mild to moderate bilateral LEE, and seen today after presenting to the ED from clinic with request for consult placed by Dr. Joan Mayans for volume overload with AKI.  History of Present Illness:   Shawn [name] is a 64 year old male with PMH as above and seen today in clinic for follow-up of nonischemic cardiomyopathy with recommendation to present from clinic to the ED.  He underwent prior cardiac catheterization 06/2013 with recommendation for medical therapy.  He has a history of poorly controlled insulin-dependent diabetes and prior strokes, polysubstance abuse including alcohol, and current tobacco abuse. He has had a history of chronic mild to moderate bilateral lower extremity edema, which typically worsened throughout the day. When last seen in the office, he was taking Lasix 25m qAM.  His weight usually fluctuated between 138-145 pounds at home and with last clinic weight 129lbs (09/26/2018).  He had a noted poor diet, frequently eating out.  Over the holidays, the patient reports he ate and drank more than usual as he was more hungry than usual. He denies any alcohol use.  He was not adding any salt to his food.  He was still smoking approximately 10 cigarettes / day. He reports that he noted his weight rapidly increasing over the last few weeks, estimating at least 30-40 pounds of weight gain with clinic weight today 170 pounds and previous clinic weight 129 pounds. Also, over the last three weeks, he noted  progressive LEE. Initially, he noted bilateral weeping edema; however, he reports today that this has significantly improved over the last week or so. He also noted a full body "itchy rash" that began ~3 weeks ago, and which has been improving slowly since its onset and after seeing his PCP as outlined below.  No associated chest pain, palpitations, or racing heart rate.  No recent presyncope or syncope.  No associated orthopnea, early satiety, SOB, DOE, or PND.  He states that he initially went to urgent care for these symptoms as outlined above in mid December 2020.  He was seen mainly for his full body abdominal rash and given antibiotics, which improved his symptoms but did not resolve them completely.  He then saw his PCP on 01/03/2019 for his ongoing symptoms and LEE/weight gain.  Per review of this documentation, his rash has been ongoing for 2 weeks by that time and described as itchy bumps for which he was using Benadryl and [place] cream without relief.  It was thought that the rash was dermatitis and triamcinolone cream and antibiotic was prescribed.  It was recommended he continue his Lasix and elevate his legs. On 01/12/2019, he underwent CT abdomen pelvis that showed small bilateral pleural effusions, moderate ascites, and anascara.  Bibasilar atelectasis was also noted and infiltrate not excluded.  In addition, aortic atherosclerosis was noted.  It was thought that gastroparesis was leading to some of his issues given his uncontrolled DM2, and he was then referred to GI. It was also recommended he take an additional half of tablet of lasix for the next 3-4 days to help with his  abdominal swelling. He saw GI 1/14 (today) with immediate referral to Cardiology and subsequent recommendation to present directly to the ED. Of note, ReDS vest was attempted in clinic but unable to get a clear read or results.   He reports today that (before his most recent increase in Lasix), he was taking his Lasix 80 mg  in the morning and Lasix 40 mg at night. It is unclear when this dose increase occurred, given he denies self increasing his diuretic. With his last PCP increase, he has been taking 27m BID with subsequent decreased urine output. On exam today, the patient continues to note bilateral lower extremity edema and abdominal distention with physical exam showing diffuse 2-3+ pitting bilateral lower extremity edema up to the level of the hip and significant abdominal distention.  He continues to deny any orthopnea, early satiety, or paroxysmal nocturnal dyspnea.  He states today that "this does not feel like when I have fluid on my lungs." No chest pain, palpitations, or racing heart rate. He reports poor urine output, despite taking his Lasix. No s/sx consistent with acute bleeding. No recent changes in bowel movements. EKG showed NSR, 83bpm, poor R wave progression in inferior and precordial leads, LVH criteria met in aVL. In the ED, sodium 138, potassium 4.9, glucose 414, creatinine 2.93, BUN 44, calcium 8.8, BNP 221.0, hemoglobin 11.0, RBC 3.97, hematocrit 35.1. CXR with small b/l pleural effusions and associated basilar atelectasis.    Heart Pathway Score:     Past Medical History:  Diagnosis Date  . Chronic combined systolic (congestive) and diastolic (congestive) heart failure ([place]    a. TTE 6/15: EF < 20%, mildly dilated LV, DD, mildly dilated LA, mod dilated RA, mild MR, mild to mod TR, mod increased posterior wall thickness, elevated LA and LVEDP; b. 4/12019 Echo: EF 20-25%, diff HK. Gr1 DD, nl RV fxn.  . CKD (chronic kidney disease), stage II   . Diabetes mellitus with complication ([place]    a. Prior admissions w/ DKA (last 12/2017).  . Essential hypertension   . NICM (nonischemic cardiomyopathy) ([place]    a. 06/2013 Echo: EF<20%; b. 06/2013 Cath: no signif dzs; c. 04/2017 Echo: 20-25%, gr1 DD.; d.05/2018 Echo: 25-30%  . Polysubstance abuse ([facility]    a. etoh and tobacco  . Stroke ([facility]     Past  Surgical History:  Procedure Laterality Date  . NO PAST SURGERIES       Home Medications:  Prior to Admission medications   Medication Sig Start Date End Date Taking? Authorizing Provider  aspirin 81 MG chewable tablet Chew 1 tablet (81 mg total) by mouth daily. 04/30/17  Yes CDemetrios Loll MD  atorvastatin (LIPITOR) 40 MG tablet Take 0.5 tablets (20 mg total) by mouth at bedtime. 09/26/18  Yes GMinna Merritts MD  B-Complex-C TABS Take 1 tablet by mouth daily.   Yes [provider]  baclofen (LIORESAL) 10 MG tablet Take 10 mg by mouth 2 (two) times daily as needed for muscle spasms. Muscle spasm   Yes [provider]  carvedilol (COREG) 12.5 MG tablet TAKE 1 TABLET BY MOUTH TWICE A DAY WITH A MEAL 09/26/18  Yes Gollan, TKathlene November MD  cetirizine (ZYRTEC) 10 MG tablet Take 10 mg by mouth daily.   Yes [provider]  furosemide (LASIX) 40 MG tablet Take 1 tablet (40 mg total) by mouth daily. Patient taking differently: Takes 80 mg am and 40 mg pm qd. 09/26/18  Yes Gollan, TKathlene November MD  hydrALAZINE (APRESOLINE) 100 MG tablet Take 100 mg by mouth 3 (three) times daily.    Yes [provider]  insulin aspart (NOVOLOG) 100 UNIT/ML injection Inject 5 Units into the skin 3 (three) times daily with meals.   Yes [provider]  insulin glargine (LANTUS) 100 UNIT/ML injection Inject 15 Units into the skin at bedtime.   Yes [provider]  metolazone (ZAROXOLYN) 5 MG tablet Take 1 tablet (5 mg) by mouth x 1 dose, 30 minutes prior to morning lasix on days your weight is 150 lbs or above Patient taking differently: Take 1/2 tablet (5 mg) by mouth x 1 dose, 30 minutes prior to morning lasix on days your weight is 150 lbs or above as neeeded QOD 05/12/18  Yes Gollan, Kathlene November, MD  omeprazole (PRILOSEC) 20 MG capsule Take 20 mg by mouth daily. 12/18/18  Yes [provider]  potassium chloride SA (K-DUR) 20 MEQ tablet Take 1 tablet (20 mEq total) by  mouth daily. 09/26/18  Yes Gollan, Kathlene November, MD  sacubitril-valsartan (ENTRESTO) 97-103 MG Take 1 tablet by mouth 2 (two) times daily. 06/29/18  Yes Minna Merritts, MD  spironolactone (ALDACTONE) 25 MG tablet Take 25 mg by mouth at bedtime.   Yes [provider]    Inpatient Medications: Scheduled Meds: . atorvastatin  20 mg Oral QHS  . carvedilol  12.5 mg Oral BID WC  . heparin  5,000 Units Subcutaneous Q8H  . hydrALAZINE  100 mg Oral TID  . [START ON 01/19/2019] insulin aspart  0-15 Units Subcutaneous TID WC  . insulin aspart  0-5 Units Subcutaneous QHS  . insulin aspart  25 Units Subcutaneous Once  . [START ON 01/19/2019] insulin aspart  5 Units Subcutaneous TID WC  . insulin glargine  15 Units Subcutaneous QHS  . sodium chloride flush  3 mL Intravenous Q12H   Continuous Infusions: . sodium chloride    . furosemide (LASIX) infusion     PRN Meds: sodium chloride, acetaminophen, ondansetron (ZOFRAN) IV, sodium chloride flush  Allergies:   No Known Allergies  Social History:   Social History   Socioeconomic History  . Marital status: Single    Spouse name: Not on file  . Number of children: Not on file  . Years of education: Not on file  . Highest education level: Not on file  Occupational History  . Not on file  Tobacco Use  . Smoking status: Current Every Day Smoker    Packs/day: 0.50    Types: Cigarettes  . Smokeless tobacco: Never Used  Substance and Sexual Activity  . Alcohol use: Not Currently  . Drug use: Not on file  . Sexual activity: Not Currently  Other Topics Concern  . Not on file  Social History Narrative  . Not on file   Social Determinants of Health   Financial Resource Strain:   . Difficulty of Paying Living Expenses: Not on file  Food Insecurity:   . Worried About Charity fundraiser in the Last Year: Not on file  . Ran Out of Food in the Last Year: Not on file  Transportation Needs:   . Lack of Transportation (Medical): Not on  file  . Lack of Transportation (Non-Medical): Not on file  Physical Activity:   . Days of Exercise per Week: Not on file  . Minutes of Exercise per Session: Not on file  Stress:   . Feeling of Stress : Not on file  Social Connections:   .  Frequency of Communication with Friends and Family: Not on file  . Frequency of Social Gatherings with Friends and Family: Not on file  . Attends Religious Services: Not on file  . Active Member of Clubs or Organizations: Not on file  . Attends Archivist Meetings: Not on file  . Marital Status: Not on file  Intimate Partner Violence:   . Fear of Current or Ex-Partner: Not on file  . Emotionally Abused: Not on file  . Physically Abused: Not on file  . Sexually Abused: Not on file    Family History:    Family History  Problem Relation Age of Onset  . Congestive Heart Failure Mother   . Diabetes Mother   . Congestive Heart Failure Brother      ROS:  Please see the history of present illness.  Review of Systems  Respiratory: Negative for cough and shortness of breath.   Cardiovascular: Positive for leg swelling. Negative for chest pain, palpitations and orthopnea.  Gastrointestinal: Positive for abdominal pain. Negative for blood in stool, constipation, diarrhea and melena.  Genitourinary: Negative for dysuria and hematuria.  Musculoskeletal: Negative for falls.  Neurological: Negative for dizziness and loss of consciousness.  All other systems reviewed and are negative.   All other ROS reviewed and negative.     Physical Exam/Data:   Vitals:   01/18/19 1545 01/18/19 1600 01/18/19 1630 01/18/19 1719  BP:  (!) 156/96 (!) 157/106 (!) 174/109  Pulse: 78  81 78  Resp: 15 20 20 18   Temp:      TempSrc:      SpO2: 95%  95% 100%  Weight:      Height:       No intake or output data in the 24 hours ending 01/18/19 1726 Last 3 Weights 01/18/2019 01/18/2019 09/26/2018  Weight (lbs) 170 lb 170 lb 129 lb  Weight (kg) 77.111 kg 77.111  kg 58.514 kg     Body mass index is 23.71 kg/m.  General:  Well nourished, well developed, in no acute distress HEENT: normal Lymph: no adenopathy Neck: no JVD Endocrine:  No thryomegaly Vascular: No carotid bruits; FA pulses 2+ bilaterally without bruits  Cardiac:  normal S1, S2; RRR; no murmur Lungs:  clear to auscultation bilaterally, no wheezing, rhonchi or rales  Abd: +abdominal distention, firm  Ext: 2-3+ bilateral lower extremity pitting edema up through tibia to level of thigh and extending all the way to the hip Musculoskeletal:  No deformities, BUE and BLE strength normal and equal Skin: warm and dry  Neuro:  CNs 2-12 intact, no focal abnormalities noted Psych:  Normal affect   EKG:  The EKG was personally reviewed and demonstrates:   EKG showed NSR, 83bpm, poor R wave progression in inferior and precordial leads, LVH criteria met in aVL. Telemetry:  Telemetry was personally reviewed and demonstrates:  Not yet on telemetry  Relevant CV Studies: Echo 05/11/2018 1. The left ventricle has severely reduced systolic function, with an ejection fraction of 25-30%. The cavity size was mildly dilated. There is mildly increased left ventricular wall thickness. Left ventricular diastolic Doppler parameters are  consistent with pseudonormalization. Left ventrical global hypokinesis without regional wall motion abnormalities. 2. The right ventricle has normal systolic function. The cavity was normal. There is no increase in right ventricular wall thickness.Unable to estimate RVSP 3. Left atrial size was mildly dilated. 4. Mitral valve regurgitation is mild to moderate  Laboratory Data:  High Sensitivity Troponin:  No results for input(s):  TROPONINIHS in the last 720 hours.   Chemistry Recent Labs  Lab 01/18/19 1147  NA 138  K 4.9  CL 99  CO2 27  GLUCOSE 414*  BUN 44*  CREATININE 2.93*  CALCIUM 8.8*  GFRNONAA 22*  GFRAA 25*  ANIONGAP 12    Recent Labs  Lab  01/18/19 1339  PROT 6.4*  ALBUMIN 3.0*  AST 23  ALT 10  ALKPHOS 90  BILITOT 0.8   Hematology Recent Labs  Lab 01/18/19 1147  WBC 6.2  RBC 3.97*  HGB 11.0*  HCT 35.1*  MCV 88.4  MCH 27.7  MCHC 31.3  RDW 14.5  PLT 439*   BNP Recent Labs  Lab 01/18/19 1147  BNP 221.0*    DDimer No results for input(s): DDIMER in the last 168 hours.   Radiology/Studies:  DG Chest 2 View  Result Date: 01/18/2019 CLINICAL DATA:  Pt presents to ED from [facility] for fluid in his abdomen and behind his lungs. Pt states 37yrhx of CHF. Pt denies CP, SOB, denies abdominal pain. Pt states abdominal swelling and BLE edema at this time;Stroke, CKD, CHF, smoker EXAM: CHEST - 2 VIEW COMPARISON:  08/07/2018 FINDINGS: Cardiac silhouette normal in size. No mediastinal or hilar masses or evidence of adenopathy. Small pleural effusions. Mild opacity at the lung bases consistent with atelectasis. Minor scarring at the apices, stable. Lungs otherwise clear. No pneumothorax. Skeletal structures are intact. IMPRESSION: 1. Small pleural effusions with associated basilar atelectasis. 2. No evidence of pneumonia or pulmonary edema. Electronically Signed   By: DLajean ManesM.D.   On: 01/18/2019 12:35   {   Assessment and Plan:   Bilateral LEE --Reports progressive bilateral lower extremity edema and abdominal distention.  He also reports a 30-40lb weight increase with increased appetite and no corresponding orthopnea. Over the last month, he has seen urgent care and his PCP with prescribed antibiotics, triamcinolone cream, and increased in lasix (either self increased or by outside provider) providing minimal relief.  In addition, he reports decreased urine output.  CT scan performed as outlined above. ReDS vest attempted today and unable to supply reading.  --Given his progressive LEE despite outpatient increased diuresis with subsequent poor urine output and history suspicious for worsening renal function, he was sent  from clinic to ED for close monitoring of renal function before proceeding with IV diuresis. Subsequent labs in the ED showed increased Cr and BUN. Given his current renal function, recommend consult nephrology.   HFrEF (EF 25-30%), NICM  --No anginal symptoms. Prior catheterization in 06/2013 with recommendation for medical management. 05/2018 echo as above with EF 25-30%, mild LVH, global LV hypokinesis without regional wall motion abnormalities, mild LAE, and mild to moderate MR.  --Bilateral LEE and abdominal distention with worsening renal function and recommendation to consult nephrology as above. Despite bilateral LEE, abdominal distention, and weight gain, he denies early satiety, orthopnea, SOB, and DOE. Sent from clinic to ED due to suspicion for worsening renal function.  --Recommend diuresis with very close monitoring of renal function and consult nephrology.   DM2 --[facility] Most recent 12/2018 labs do show slight improvement in Hgb A1C from 12.4 to 11.6. SSI, per IM.   Current Smoker, History of Alcohol Use --Cessation advised.   For questions or updates, please contact [place] consult [email or web address] for contact info under     Signed, JArvil Chaco PA-C  01/18/2019 5:26 PM

## 2019-01-18 NOTE — H&P (Addendum)
[place] [employer] NAME: Shawn [name]    MR#:  [id number]  DATE OF BIRTH:  02-26-1955  DATE OF ADMISSION:  01/18/2019  PRIMARY CARE PHYSICIAN: [facility] [place]   REQUESTING/REFERRING PHYSICIAN: Dr monks  Patient coming from :Home   CHIEF COMPLAINT:  I have gained a lot of weight since Christmas. Legs are swollen and my abdomen is swollen.   HISTORY OF PRESENT ILLNESS:  Shawn [name]  is a 64 y.o. male with a known history of ischemic cardiomyopathy EF of 25 to 30%, poorly controlled type II diabetes, prior CVA, polysubstance abuse including alcohol in the past with current tobacco abuse comes in from [facility] cardiology office with volume overload/weight gain of about 30 pounds and significant leg edema and abdominal tightness.  Patient's baseline weight he tells me is  around 140 he was measured about 170 pounds in the ER (per pt)  He has significant leg edema abdominal tightness. Denies shortness of breath or chest pain.  Patient reports he has eat a lot of salty food made by family members during the holiday. He has been experiencing increased weight gain for last three weeks.   ED course  in the ER patient received 40 mg of IV Lasix. He is setting more than 92% on room air. Patient is being admitted for volume overload with acute on chronic congestive heart failure, combined systolic diastolic non-ischemic cardiomyopathy  PAST MEDICAL HISTORY:   Past Medical History:  Diagnosis Date  . Chronic combined systolic (congestive) and diastolic (congestive) heart failure ([place])    a. TTE 6/15: EF < 20%, mildly dilated LV, DD, mildly dilated LA, mod dilated RA, mild MR, mild to mod TR, mod increased posterior wall thickness, elevated LA and LVEDP; b. 4/12019 Echo: EF 20-25%, diff HK. Gr1 DD, nl RV fxn.  . CKD (chronic kidney disease), stage II   . Diabetes mellitus with complication ([place])    a. Prior admissions w/ DKA (last  12/2017).  . Essential hypertension   . NICM (nonischemic cardiomyopathy) ([place])    a. 06/2013 Echo: EF<20%; b. 06/2013 Cath: no signif dzs; c. 04/2017 Echo: 20-25%, gr1 DD.; d.05/2018 Echo: 25-30%  . Polysubstance abuse ([place])    a. etoh and tobacco  . Stroke ([place])     PAST SURGICAL HISTOIRY:   Past Surgical History:  Procedure Laterality Date  . NO PAST SURGERIES      SOCIAL HISTORY:   Social History   Tobacco Use  . Smoking status: Current Every Day Smoker    Packs/day: 0.50    Types: Cigarettes  . Smokeless tobacco: Never Used  Substance Use Topics  . Alcohol use: Not Currently    FAMILY HISTORY:   Family History  Problem Relation Age of Onset  . Congestive Heart Failure Mother   . Diabetes Mother   . Congestive Heart Failure Brother     DRUG ALLERGIES:  No Known Allergies  REVIEW OF SYSTEMS:  Review of Systems  Constitutional: Negative for chills, fever and weight loss.  HENT: Negative for ear discharge, ear pain and nosebleeds.   Eyes: Negative for blurred vision, pain and discharge.  Respiratory: Positive for shortness of breath. Negative for sputum production, wheezing and stridor.   Cardiovascular: Positive for leg swelling. Negative for chest pain, palpitations, orthopnea and PND.  Gastrointestinal: Negative for abdominal pain, diarrhea, nausea and vomiting.       Abdominal tightness due to weight gain  Genitourinary: Negative for  frequency and urgency.  Musculoskeletal: Negative for back pain and joint pain.  Neurological: Positive for weakness. Negative for sensory change, speech change and focal weakness.  Psychiatric/Behavioral: Negative for depression and hallucinations. The patient is not nervous/anxious.      MEDICATIONS AT HOME:   Prior to Admission medications   Medication Sig Start Date End Date Taking? Authorizing Provider  aspirin 81 MG chewable tablet Chew 1 tablet (81 mg total) by mouth daily. 04/30/17   Demetrios Loll, MD  atorvastatin  (LIPITOR) 40 MG tablet Take 0.5 tablets (20 mg total) by mouth at bedtime. 09/26/18   Minna Merritts, MD  B-Complex-C TABS Take 1 tablet by mouth daily.    [provider]  baclofen (LIORESAL) 10 MG tablet Take 10 mg by mouth 2 (two) times daily as needed for muscle spasms. Muscle spasm    [provider]  carvedilol (COREG) 12.5 MG tablet TAKE 1 TABLET BY MOUTH TWICE A DAY WITH A MEAL 09/26/18   Gollan, Kathlene November, MD  furosemide (LASIX) 40 MG tablet Take 1 tablet (40 mg total) by mouth daily. Patient taking differently: Takes 80 mg am and 40 mg pm qd. 09/26/18   Minna Merritts, MD  hydrALAZINE (APRESOLINE) 100 MG tablet Take 100 mg by mouth 3 (three) times daily.     [provider]  insulin aspart (NOVOLOG) 100 UNIT/ML injection Inject 5 Units into the skin 3 (three) times daily with meals.    [provider]  insulin glargine (LANTUS) 100 UNIT/ML injection Inject 15 Units into the skin at bedtime.    [provider]  metolazone (ZAROXOLYN) 5 MG tablet Take 1 tablet (5 mg) by mouth x 1 dose, 30 minutes prior to morning lasix on days your weight is 150 lbs or above Patient taking differently: Take 1/2 tablet (5 mg) by mouth x 1 dose, 30 minutes prior to morning lasix on days your weight is 150 lbs or above as neeeded QOD 05/12/18   Gollan, Kathlene November, MD  potassium chloride SA (K-DUR) 20 MEQ tablet Take 1 tablet (20 mEq total) by mouth daily. 09/26/18   Minna Merritts, MD  sacubitril-valsartan (ENTRESTO) 97-103 MG Take 1 tablet by mouth 2 (two) times daily. 06/29/18   Minna Merritts, MD  spironolactone (ALDACTONE) 25 MG tablet Take 25 mg by mouth at bedtime.    [provider]      VITAL SIGNS:  Blood pressure (!) 158/84, pulse 78, temperature 97.7 F (36.5 C), temperature source Oral, resp. rate 19, height 5\' 11"  (1.803 m), weight 77.1 kg, SpO2 94 %.  PHYSICAL EXAMINATION:  GENERAL:  64 y.o.-year-old patient lying in the bed with no  acute distress.  EYES: Pupils equal, round, reactive to light and accommodation. No scleral icterus.  HEENT: Head atraumatic, normocephalic. Oropharynx and nasopharynx clear.  NECK:  Supple, no jugular venous distention. No thyroid enlargement, no tenderness.  LUNGS: Normal breath sounds bilaterally, no wheezing, rales,rhonchi or crepitation. No use of accessory muscles of respiration.  CARDIOVASCULAR: S1, S2 normal. No murmurs, rubs, or gallops.  ABDOMEN: Soft, nontender, nondistended. Bowel sounds present. No organomegaly or mass. Abdominal tightness due to edema EXTREMITIES: 4+ pitting edema bilateral lower extremity up to the thigh NEUROLOGIC: Cranial nerves II through XII are intact. Muscle strength 5/5 in all extremities. Sensation intact. Gait not checked.  PSYCHIATRIC: The patient is alert and oriented x 3.  SKIN: No obvious rash, lesion, or ulcer.   LABORATORY PANEL:   CBC Recent  Labs  Lab 01/18/19 1147  WBC 6.2  HGB 11.0*  HCT 35.1*  PLT 439*   ------------------------------------------------------------------------------------------------------------------  Chemistries  Recent Labs  Lab 01/18/19 1147 01/18/19 1339  NA 138  --   K 4.9  --   CL 99  --   CO2 27  --   GLUCOSE 414*  --   BUN 44*  --   CREATININE 2.93*  --   CALCIUM 8.8*  --   AST  --  23  ALT  --  10  ALKPHOS  --  90  BILITOT  --  0.8   ------------------------------------------------------------------------------------------------------------------  Cardiac Enzymes No results for input(s): TROPONINI in the last 168 hours. ------------------------------------------------------------------------------------------------------------------  RADIOLOGY:  DG Chest 2 View  Result Date: 01/18/2019 CLINICAL DATA:  Pt presents to ED from [facility] for fluid in his abdomen and behind his lungs. Pt states 36yr hx of CHF. Pt denies CP, SOB, denies abdominal pain. Pt states abdominal swelling and BLE edema at  this time;Stroke, CKD, CHF, smoker EXAM: CHEST - 2 VIEW COMPARISON:  08/07/2018 FINDINGS: Cardiac silhouette normal in size. No mediastinal or hilar masses or evidence of adenopathy. Small pleural effusions. Mild opacity at the lung bases consistent with atelectasis. Minor scarring at the apices, stable. Lungs otherwise clear. No pneumothorax. Skeletal structures are intact. IMPRESSION: 1. Small pleural effusions with associated basilar atelectasis. 2. No evidence of pneumonia or pulmonary edema. Electronically Signed   By: Lajean Manes M.D.   On: 01/18/2019 12:35    EKG:    IMPRESSION AND PLAN:   Shawn [name]  is a 64 y.o. male with a known history of ischemic cardiomyopathy EF of 25 to 30%, poorly controlled type II diabetes, prior CVA, polysubstance abuse including alcohol in the past with current tobacco abuse comes in from [facility] cardiology office with volume overload/weight gain of about 30 pounds and significant leg edema and abdominal tightness.  1. Acute on chronic combined systolic diastolic congestive heart failure with significant weight gain of about 30 pounds over the holidays due to dietary discretion -admit to telemetry -IV Lasix 40 mg BID-- consider IV Lasix drip. I have asked Dr. Juleen China to see patient -continue Coreg and hydralazine -hold Enteresto and [place] [facility] cardiology Dr. Rockey Situ to see patient -dietary consultation placed  2. Acute on chronic kidney disease stage III -baseline creatinine 1.2 -came in with creatinine of 2.93 suspected due to CHF systolic with low volume and perfusion -IV Lasix monitor input output -avoid nephrotoxic agents -nephrology consultation with Dr. Juleen China  3. Poorly controlled type II diabetes with hyperglycemia with CKD stage III -last A1c was 15 -sliding scale insulin and Lantus -adjust Lantus according to patient's sugar  4. Hyperlipidemia continue statins  5. Tobacoo abuse patient advised smoking  cessation.    Family Communication :pt Consults : cardiology, nephrology  code Status : full code DVT prophylaxis : heparin admission status: inpatient  TOTAL TIME TAKING CARE OF THIS PATIENT: *55 minutes.    Fritzi Mandes M.D on 01/18/2019 at 3:22 PM  Between 7am to 6pm - Pager - [phone]  After 6pm go to [email or web address] - password [email or web address] [facility]    CC: Primary care physician; [facility] [place]

## 2019-01-18 NOTE — Consult Note (Signed)
[facility]  CONSULT NOTE    Date: 01/18/2019                  Patient Name:  Shawn [name]  MRN: [id number]  DOB: Mar 24, 1955  Age / Sex: 64 y.o., male         PCP: [place]                 Service Requesting Consult: Dr. Posey Pronto                 Reason for Consult: Acute renal failure            History of Present Illness: Shawn [name] states that he has been gaining weight since Christmas. Patient states he has gained over 30 pounds since then. He has been taking his diuretics which include furosemide, metolazone and spironlactone.   Patient states he has been eating salty foods.    Medications: Outpatient medications: Medications Prior to Admission  Medication Sig Dispense Refill Last Dose  . aspirin 81 MG chewable tablet Chew 1 tablet (81 mg total) by mouth daily. 30 tablet 1 01/18/2019 at 0800  . atorvastatin (LIPITOR) 40 MG tablet Take 0.5 tablets (20 mg total) by mouth at bedtime. 45 tablet 1 01/17/2019 at Unknown time  . B-Complex-C TABS Take 1 tablet by mouth daily.   01/18/2019 at 0800  . baclofen (LIORESAL) 10 MG tablet Take 10 mg by mouth 2 (two) times daily as needed for muscle spasms. Muscle spasm   prn at prn  . carvedilol (COREG) 12.5 MG tablet TAKE 1 TABLET BY MOUTH TWICE A DAY WITH A MEAL 180 tablet 1 01/18/2019 at 0800  . cetirizine (ZYRTEC) 10 MG tablet Take 10 mg by mouth daily.   01/18/2019 at 0800  . furosemide (LASIX) 40 MG tablet Take 1 tablet (40 mg total) by mouth daily. (Patient taking differently: Takes 80 mg am and 40 mg pm qd.) 90 tablet 1 01/18/2019 at 0800  . hydrALAZINE (APRESOLINE) 100 MG tablet Take 100 mg by mouth 3 (three) times daily.    01/18/2019 at 0800  . insulin aspart (NOVOLOG) 100 UNIT/ML injection Inject 5 Units into the skin 3 (three) times daily with meals.   01/18/2019 at 0800  . insulin glargine (LANTUS) 100 UNIT/ML injection Inject 15 Units into the skin at bedtime.   01/17/2019 at  Unknown time  . metolazone (ZAROXOLYN) 5 MG tablet Take 1 tablet (5 mg) by mouth x 1 dose, 30 minutes prior to morning lasix on days your weight is 150 lbs or above (Patient taking differently: Take 1/2 tablet (5 mg) by mouth x 1 dose, 30 minutes prior to morning lasix on days your weight is 150 lbs or above as neeeded QOD) 10 tablet 3 prn at prn  . omeprazole (PRILOSEC) 20 MG capsule Take 20 mg by mouth daily.   01/18/2019 at 0800  . potassium chloride SA (K-DUR) 20 MEQ tablet Take 1 tablet (20 mEq total) by mouth daily. 90 tablet 1 01/18/2019 at 0800  . sacubitril-valsartan (ENTRESTO) 97-103 MG Take 1 tablet by mouth 2 (two) times daily. 180 tablet 0 01/18/2019 at 0800  . spironolactone (ALDACTONE) 25 MG tablet Take 25 mg by mouth at bedtime.   01/17/2019 at Unknown time    Current medications: Current Facility-Administered Medications  Medication Dose Route Frequency Provider Last Rate Last Admin  . 0.9 %  sodium chloride infusion  250 mL Intravenous PRN Posey Pronto,  Sona, MD      . acetaminophen (TYLENOL) tablet 650 mg  650 mg Oral Q4H PRN Fritzi Mandes, MD      . atorvastatin (LIPITOR) tablet 20 mg  20 mg Oral QHS Fritzi Mandes, MD      . carvedilol (COREG) tablet 12.5 mg  12.5 mg Oral BID WC Fritzi Mandes, MD   12.5 mg at 01/18/19 1744  . furosemide (LASIX) 250 mg in dextrose 5 % 250 mL (1 mg/mL) infusion  4 mg/hr Intravenous Continuous Dayona Shaheen, MD 4 mL/hr at 01/18/19 1816 4 mg/hr at 01/18/19 1816  . heparin injection 5,000 Units  5,000 Units Subcutaneous Q8H Fritzi Mandes, MD   5,000 Units at 01/18/19 1744  . hydrALAZINE (APRESOLINE) tablet 100 mg  100 mg Oral TID Fritzi Mandes, MD   100 mg at 01/18/19 1744  . [START ON 01/19/2019] insulin aspart (novoLOG) injection 0-15 Units  0-15 Units Subcutaneous TID WC Fritzi Mandes, MD      . insulin aspart (novoLOG) injection 0-5 Units  0-5 Units Subcutaneous QHS Fritzi Mandes, MD      . Derrill Memo ON 01/19/2019] insulin aspart (novoLOG) injection 5 Units  5 Units  Subcutaneous TID WC Fritzi Mandes, MD      . insulin glargine (LANTUS) injection 20 Units  20 Units Subcutaneous QHS Fritzi Mandes, MD      . ondansetron [facility]) injection 4 mg  4 mg Intravenous Q6H PRN Fritzi Mandes, MD      . sodium chloride flush (NS) 0.9 % injection 3 mL  3 mL Intravenous Q12H Fritzi Mandes, MD      . sodium chloride flush (NS) 0.9 % injection 3 mL  3 mL Intravenous PRN Fritzi Mandes, MD          Allergies: No Known Allergies    Past Medical History: Past Medical History:  Diagnosis Date  . Chronic combined systolic (congestive) and diastolic (congestive) heart failure ([place])    a. TTE 6/15: EF < 20%, mildly dilated LV, DD, mildly dilated LA, mod dilated RA, mild MR, mild to mod TR, mod increased posterior wall thickness, elevated LA and LVEDP; b. 4/12019 Echo: EF 20-25%, diff HK. Gr1 DD, nl RV fxn.  . CKD (chronic kidney disease), stage II   . Diabetes mellitus with complication ([place])    a. Prior admissions w/ DKA (last 12/2017).  . Essential hypertension   . NICM (nonischemic cardiomyopathy) ([place])    a. 06/2013 Echo: EF<20%; b. 06/2013 Cath: no signif dzs; c. 04/2017 Echo: 20-25%, gr1 DD.; d.05/2018 Echo: 25-30%  . Polysubstance abuse ([place])    a. etoh and tobacco  . Stroke [facility])      Past Surgical History: Past Surgical History:  Procedure Laterality Date  . NO PAST SURGERIES       Family History: Family History  Problem Relation Age of Onset  . Congestive Heart Failure Mother   . Diabetes Mother   . Congestive Heart Failure Brother      Social History: Social History   Socioeconomic History  . Marital status: Single    Spouse name: Not on file  . Number of children: Not on file  . Years of education: Not on file  . Highest education level: Not on file  Occupational History  . Not on file  Tobacco Use  . Smoking status: Current Every Day Smoker    Packs/day: 0.50    Types: Cigarettes  . Smokeless tobacco: Never Used  Substance and Sexual Activity   .  Alcohol use: Not Currently  . Drug use: Not on file  . Sexual activity: Not Currently  Other Topics Concern  . Not on file  Social History Narrative  . Not on file   Social Determinants of Health   Financial Resource Strain:   . Difficulty of Paying Living Expenses: Not on file  Food Insecurity:   . Worried About Charity fundraiser in the Last Year: Not on file  . Ran Out of Food in the Last Year: Not on file  Transportation Needs:   . Lack of Transportation (Medical): Not on file  . Lack of Transportation (Non-Medical): Not on file  Physical Activity:   . Days of Exercise per Week: Not on file  . Minutes of Exercise per Session: Not on file  Stress:   . Feeling of Stress : Not on file  Social Connections:   . Frequency of Communication with Friends and Family: Not on file  . Frequency of Social Gatherings with Friends and Family: Not on file  . Attends Religious Services: Not on file  . Active Member of Clubs or Organizations: Not on file  . Attends Archivist Meetings: Not on file  . Marital Status: Not on file  Intimate Partner Violence:   . Fear of Current or Ex-Partner: Not on file  . Emotionally Abused: Not on file  . Physically Abused: Not on file  . Sexually Abused: Not on file     Review of Systems: Review of Systems  Constitutional: Negative.  Negative for chills, diaphoresis, fever, malaise/fatigue and weight loss.  HENT: Negative.  Negative for congestion, ear discharge, ear pain, hearing loss, nosebleeds, sinus pain, sore throat and tinnitus.   Eyes: Negative.  Negative for blurred vision, double vision, photophobia, pain, discharge and redness.  Respiratory: Positive for cough, shortness of breath and wheezing. Negative for hemoptysis, sputum production and stridor.   Cardiovascular: Positive for leg swelling and PND. Negative for chest pain, palpitations, orthopnea and claudication.  Gastrointestinal: Negative.  Negative for abdominal pain,  blood in stool, constipation, diarrhea, heartburn, melena, nausea and vomiting.  Genitourinary: Negative.  Negative for dysuria, flank pain, frequency, hematuria and urgency.  Musculoskeletal: Negative.  Negative for back pain, falls, joint pain, myalgias and neck pain.  Skin: Negative.  Negative for itching and rash.  Neurological: Negative.  Negative for dizziness, tingling, tremors, sensory change, speech change, focal weakness, seizures, loss of consciousness, weakness and headaches.  Endo/Heme/Allergies: Negative.  Negative for environmental allergies and polydipsia. Does not bruise/bleed easily.  Psychiatric/Behavioral: Negative.  Negative for depression, hallucinations, memory loss, substance abuse and suicidal ideas. The patient is not nervous/anxious and does not have insomnia.     Vital Signs: Blood pressure 111/60, pulse 79, temperature 97.7 F (36.5 C), temperature source Oral, resp. rate 18, height 5\' 11"  (1.803 m), weight 77.1 kg, SpO2 100 %.  Weight trends: Filed Weights   01/18/19 1145 01/18/19 1716  Weight: 77.1 kg 77.1 kg    Physical Exam: General: NAD,   Head: Normocephalic, atraumatic. Moist oral mucosal membranes  Eyes: Anicteric, PERRL  Neck: Supple, trachea midline  Lungs:  Bilateral basilar crackle  Heart: Regular rate and rhythm  Abdomen:  +distended  Extremities:  + peripheral edema.  Neurologic: Nonfocal, moving all four extremities  Skin: No lesions         Lab results: Basic Metabolic Panel: Recent Labs  Lab 01/18/19 1147  NA 138  K 4.9  CL 99  CO2 27  GLUCOSE 414*  BUN 44*  CREATININE 2.93*  CALCIUM 8.8*    Liver Function Tests: Recent Labs  Lab 01/18/19 1339  AST 23  ALT 10  ALKPHOS 90  BILITOT 0.8  PROT 6.4*  ALBUMIN 3.0*   No results for input(s): LIPASE, AMYLASE in the last 168 hours. No results for input(s): AMMONIA in the last 168 hours.  CBC: Recent Labs  Lab 01/18/19 1147  WBC 6.2  HGB 11.0*  HCT 35.1*  MCV  88.4  PLT 439*    Cardiac Enzymes: No results for input(s): CKTOTAL, CKMB, CKMBINDEX, TROPONINI in the last 168 hours.  BNP: Invalid input(s): POCBNP  CBG: Recent Labs  Lab 01/18/19 [id number]    Microbiology: Results for orders placed or performed during the hospital encounter of 09/22/18  SARS Coronavirus 2 [facility] order, Performed in [facility] hospital lab) Nasopharyngeal Nasopharyngeal Swab     Status: None   Collection Time: 09/22/18  3:10 PM   Specimen: Nasopharyngeal Swab  Result Value Ref Range Status   SARS Coronavirus 2 NEGATIVE NEGATIVE Final    Comment: (NOTE) If result is NEGATIVE SARS-CoV-2 target nucleic acids are NOT DETECTED. The SARS-CoV-2 RNA is generally detectable in upper and lower  respiratory specimens during the acute phase of infection. The lowest  concentration of SARS-CoV-2 viral copies this assay can detect is 250  copies / mL. A negative result does not preclude SARS-CoV-2 infection  and should not be used as the sole basis for treatment or other  patient management decisions.  A negative result may occur with  improper specimen collection / handling, submission of specimen other  than nasopharyngeal swab, presence of viral mutation(s) within the  areas targeted by this assay, and inadequate number of viral copies  (<250 copies / mL). A negative result must be combined with clinical  observations, patient history, and epidemiological information. If result is POSITIVE SARS-CoV-2 target nucleic acids are DETECTED. The SARS-CoV-2 RNA is generally detectable in upper and lower  respiratory specimens dur ing the acute phase of infection.  Positive  results are indicative of active infection with SARS-CoV-2.  Clinical  correlation with patient history and other diagnostic information is  necessary to determine patient infection status.  Positive results do  not rule out bacterial infection or co-infection with other viruses. If result  is PRESUMPTIVE POSTIVE SARS-CoV-2 nucleic acids MAY BE PRESENT.   A presumptive positive result was obtained on the submitted specimen  and confirmed on repeat testing.  While 2019 novel coronavirus  (SARS-CoV-2) nucleic acids may be present in the submitted sample  additional confirmatory testing may be necessary for epidemiological  and / or clinical management purposes  to differentiate between  SARS-CoV-2 and other Sarbecovirus currently known to infect humans.  If clinically indicated additional testing with an alternate test  methodology [id number]) is advised. The SARS-CoV-2 RNA is generally  detectable in upper and lower respiratory sp ecimens during the acute  phase of infection. The expected result is Negative. Fact Sheet for Patients:  StrictlyIdeas.no Fact Sheet for Healthcare Providers: BankingDealers.co.za This test is not yet approved or cleared by the [place] FDA and has been authorized for detection and/or diagnosis of SARS-CoV-2 by FDA under an Emergency Use Authorization (EUA).  This EUA will remain in effect (meaning this test can be used) for the duration of the COVID-19 declaration under Section 564(b)(1) of the Act, 21 U.S.C. section 360bbb-3(b)(1), unless the authorization is terminated or revoked sooner. Performed at [facility], [phone]  [address] [place], [place] [address]   MRSA PCR Screening     Status: None   Collection Time: 09/22/18  7:41 PM   Specimen: Nasopharyngeal  Result Value Ref Range Status   MRSA by PCR NEGATIVE NEGATIVE Final    Comment:        The GeneXpert MRSA Assay (FDA approved for NASAL specimens only), is one component of a comprehensive MRSA colonization surveillance program. It is not intended to diagnose MRSA infection nor to guide or monitor treatment for MRSA infections. Performed at [facility], [address] [place], [place] [address]      Coagulation Studies: No results for input(s): LABPROT, INR in the last 72 hours.  Urinalysis: No results for input(s): COLORURINE, LABSPEC, PHURINE, GLUCOSEU, HGBUR, BILIRUBINUR, KETONESUR, PROTEINUR, UROBILINOGEN, NITRITE, LEUKOCYTESUR in the last 72 hours.  Invalid input(s): APPERANCEUR    Imaging: DG Chest 2 View  Result Date: 01/18/2019 CLINICAL DATA:  Pt presents to ED from [facility] for fluid in his abdomen and behind his lungs. Pt states 73yr hx of CHF. Pt denies CP, SOB, denies abdominal pain. Pt states abdominal swelling and BLE edema at this time;Stroke, CKD, CHF, smoker EXAM: CHEST - 2 VIEW COMPARISON:  08/07/2018 FINDINGS: Cardiac silhouette normal in size. No mediastinal or hilar masses or evidence of adenopathy. Small pleural effusions. Mild opacity at the lung bases consistent with atelectasis. Minor scarring at the apices, stable. Lungs otherwise clear. No pneumothorax. Skeletal structures are intact. IMPRESSION: 1. Small pleural effusions with associated basilar atelectasis. 2. No evidence of pneumonia or pulmonary edema. Electronically Signed   By: Lajean Manes M.D.   On: 01/18/2019 12:35      Assessment & Plan: Mr. CHADEN BATTIS is a 64 y.o. black male with congestive heart failure, CVA, hypertension, diabetes mellitus type II insulin dependent, who was admitted to [facility] on 01/18/2019 for Fluid retention [R60.9] Congestive heart failure with cardiomyopathy ([place]) [I50.9, I42.9] AKI (acute kidney injury) ([place]) [N17.9]   1. Acute renal failure: baseline creatinine 1.4,  GFR greater than 60 on 01/03/2019. Most likely with underlying diabetic nephropathy with proteinuria and glycosuria.  Acute renal failure secondary to cardiorenal syndrome  2. Acute exacerbation of systolic congestive heart failure: home regimen of furosemide 40mg  bid, spironolactone and metolazone.   3. Hypertension: home regimen of Entresto.   Plan  - start furosemide gtt - check urine studies.        LOS: 0 Leane Loring 1/14/20216:59 PM

## 2019-01-18 NOTE — ED Provider Notes (Signed)
[facility] Emergency Department Provider Note  ____________________________________________   First MD Initiated Contact with Patient 01/18/19 1235     (approximate)  I have reviewed the triage vital signs and the nursing notes.  History  Chief Complaint Abdominal Swelling and Congestive Heart Failure    HPI Shawn [name] is a 64 y.o. male with hx of NICM (EF 25-30%), DM2, prior alcohol use, tobacco use who presents to the ED, referred from the heart failure clinic for likely admission due to significant increased fluid retention, weight gain.  Patient states over the last 2 to 3 weeks he has developed significant fluid retention particularly in his abdomen and his legs.  His edema has extended from his lower extremities all the way up to his mid thighs.  He reports a 30 to 40 pound weight gain.  He reports compliance with his medication, including his Lasix, but feels his urinary response is not as robust after taking.  He denies any chest pain, shortness of breath, orthopnea.  He denies any abdominal pain.  No fevers, cough, nausea, vomiting.  Denies any history of liver disease.   Past Medical Hx Past Medical History:  Diagnosis Date  . Chronic combined systolic (congestive) and diastolic (congestive) heart failure ([place])    a. TTE 6/15: EF < 20%, mildly dilated LV, DD, mildly dilated LA, mod dilated RA, mild MR, mild to mod TR, mod increased posterior wall thickness, elevated LA and LVEDP; b. 4/12019 Echo: EF 20-25%, diff HK. Gr1 DD, nl RV fxn.  . CKD (chronic kidney disease), stage II   . Diabetes mellitus with complication ([place])    a. Prior admissions w/ DKA (last 12/2017).  . Essential hypertension   . NICM (nonischemic cardiomyopathy) ([place])    a. 06/2013 Echo: EF<20%; b. 06/2013 Cath: no signif dzs; c. 04/2017 Echo: 20-25%, gr1 DD.; d.05/2018 Echo: 25-30%  . Polysubstance abuse ([place])    a. etoh and tobacco  . Stroke [facility])     Problem  List Patient Active Problem List   Diagnosis Date Noted  . Nonketotic hyperglycinemia ([place]) 09/22/2018  . Nonketotic hypoglycemia 09/22/2018  . DKA, type 2 ([place]) 08/07/2018  . Essential hypertension 05/12/2018  . Chronic systolic CHF (congestive heart failure) ([place]) 12/15/2017  . NICM (nonischemic cardiomyopathy) ([place]) 05/13/2017  . Type 2 diabetes mellitus with complication, with long-term current use of insulin ([place]) 05/13/2017  . Smoker 05/13/2017  . Polysubstance abuse ([place]) 05/13/2017  . Alcohol abuse 05/13/2017  . Diabetic ketoacidosis without coma associated with type 2 diabetes mellitus ([place]) 04/27/2017    Past Surgical Hx Past Surgical History:  Procedure Laterality Date  . NO PAST SURGERIES      Medications Prior to Admission medications   Medication Sig Start Date End Date Taking? Authorizing Provider  aspirin 81 MG chewable tablet Chew 1 tablet (81 mg total) by mouth daily. 04/30/17   Demetrios Loll, MD  atorvastatin (LIPITOR) 40 MG tablet Take 0.5 tablets (20 mg total) by mouth at bedtime. 09/26/18   Minna Merritts, MD  B-Complex-C TABS Take 1 tablet by mouth daily.    [provider]  baclofen (LIORESAL) 10 MG tablet Take 10 mg by mouth 2 (two) times daily as needed for muscle spasms. Muscle spasm    [provider]  carvedilol (COREG) 12.5 MG tablet TAKE 1 TABLET BY MOUTH TWICE A DAY WITH A MEAL 09/26/18   Minna Merritts, MD  furosemide (LASIX) 40 MG tablet Take 1 tablet (40  mg total) by mouth daily. Patient taking differently: Takes 80 mg am and 40 mg pm qd. 09/26/18   Minna Merritts, MD  hydrALAZINE (APRESOLINE) 100 MG tablet Take 100 mg by mouth 3 (three) times daily.     [provider]  insulin aspart (NOVOLOG) 100 UNIT/ML injection Inject 5 Units into the skin 3 (three) times daily with meals.    [provider]  insulin glargine (LANTUS) 100 UNIT/ML injection Inject 15 Units into the skin at bedtime.    [provider]  metolazone (ZAROXOLYN) 5 MG tablet Take 1 tablet (5 mg) by mouth x 1 dose, 30 minutes prior to morning lasix on days your weight is 150 lbs or above Patient taking differently: Take 1/2 tablet (5 mg) by mouth x 1 dose, 30 minutes prior to morning lasix on days your weight is 150 lbs or above as neeeded QOD 05/12/18   Gollan, Kathlene November, MD  potassium chloride SA (K-DUR) 20 MEQ tablet Take 1 tablet (20 mEq total) by mouth daily. 09/26/18   Minna Merritts, MD  sacubitril-valsartan (ENTRESTO) 97-103 MG Take 1 tablet by mouth 2 (two) times daily. 06/29/18   Minna Merritts, MD  spironolactone (ALDACTONE) 25 MG tablet Take 25 mg by mouth at bedtime.    [provider]    Allergies Patient has no known allergies.  Family Hx Family History  Problem Relation Age of Onset  . Congestive Heart Failure Mother   . Diabetes Mother   . Congestive Heart Failure Brother     Social Hx Social History   Tobacco Use  . Smoking status: Current Every Day Smoker    Packs/day: 0.50    Types: Cigarettes  . Smokeless tobacco: Never Used  Substance Use Topics  . Alcohol use: Not Currently  . Drug use: Not on file     Review of Systems  Constitutional: Negative for fever, chills. Eyes: Negative for visual changes. ENT: Negative for sore throat. Cardiovascular: Negative for chest pain. Respiratory: Negative for shortness of breath. Gastrointestinal: Negative for nausea, vomiting. + abdominal swelling Genitourinary: Negative for dysuria. Musculoskeletal: ++ for leg swelling. Skin: Negative for rash. Neurological: Negative for for headaches.   Physical Exam  Vital Signs: ED Triage Vitals  Enc Vitals Group     BP 01/18/19 1144 137/79     Pulse Rate 01/18/19 1144 83     Resp 01/18/19 1144 18     Temp 01/18/19 1144 97.7 F (36.5 C)     Temp Source 01/18/19 1144 Oral     SpO2 01/18/19 1144 98 %     Weight 01/18/19 1145 170 lb (77.1 kg)     Height 01/18/19 1145 5\' 11"  (1.803 m)      Head Circumference --      Peak Flow --      Pain Score 01/18/19 1145 0     Pain Loc --      Pain Edu? --      Excl. in [place]? --     Constitutional: Alert and oriented.  Head: Normocephalic. Atraumatic. Eyes: Conjunctivae clear. Sclera anicteric. Nose: No congestion. No rhinorrhea. Mouth/Throat: Wearing mask.  Neck: No stridor.   Cardiovascular: Normal rate, regular rhythm. Extremities well perfused. Respiratory: Normal respiratory effort.  Lungs CTAB. Gastrointestinal: Soft. Moderate ascites/distention. Non-tender.  Musculoskeletal: Bilateral lower extremity pitting edema to the level of the mid thigh. Neurologic:  Normal speech and language. No gross focal neurologic deficits are appreciated.  Skin: Skin is warm,  dry and intact. No rash noted. Psychiatric: Mood and affect are appropriate for situation.  EKG  Personally reviewed.   Rate: 82 Rhythm: sinus Axis: normal Intervals: WNL No acute ischemic changes No STEMI    Radiology  CXR: IMPRESSION:  1. Small pleural effusions with associated basilar atelectasis.  2. No evidence of pneumonia or pulmonary edema.    Procedures  Procedure(s) performed (including critical care):  Procedures   Initial Impression / Assessment and Plan / ED Course  64 y.o. male who presents to the ED for fluid retention, as above.  On exam, he does have impressive pitting edema to the level of the mid thigh, as well as abdominal distention with presumed ascites.  No tenderness on exam to suggest SBP, additionally he is afebrile, no leukocytosis.  Labs reveal AKI, creatinine 2.93 from prior. Added on HFP to evaluate his albumin level, which could be contributing.  Does appear by exam that he would likely benefit from aggressive IV diuresis with careful monitoring of his kidney function.  We will give a dose of IV Lasix here and plan to admit.  [facility].  Discussed with hospitalist for  admission.   Final Clinical Impression(s) / ED Diagnosis  Final diagnoses:  Fluid retention  AKI (acute kidney injury) ([place])       Note:  This document was prepared using Dragon voice recognition software and may include unintentional dictation errors.   Lilia Pro., MD 01/18/19 [phone]

## 2019-01-18 NOTE — Patient Instructions (Signed)
Patient sent to ED.

## 2019-01-18 NOTE — Progress Notes (Signed)
Office Visit    Patient Name: Shawn [name] Date of Encounter: 01/18/2019  Primary Care Provider:  [facility] [place] Primary Cardiologist:  Ida Rogue, MD  Chief Complaint    64 yo male with PMH  Of NICM (EF 25-30%), poorly controlled DM2, prior CVAs, polysubstance abuse including alcohol, current tobacco use (1 pk/day), chronic mild to moderate bilateral LEE, and seen today for follow-up of NICM.  Past Medical History    Past Medical History:  Diagnosis Date  . Chronic combined systolic (congestive) and diastolic (congestive) heart failure ([place])    a. TTE 6/15: EF < 20%, mildly dilated LV, DD, mildly dilated LA, mod dilated RA, mild MR, mild to mod TR, mod increased posterior wall thickness, elevated LA and LVEDP; b. 4/12019 Echo: EF 20-25%, diff HK. Gr1 DD, nl RV fxn.  . CKD (chronic kidney disease), stage II   . Diabetes mellitus with complication ([place])    a. Prior admissions w/ DKA (last 12/2017).  . Essential hypertension   . NICM (nonischemic cardiomyopathy) ([place])    a. 06/2013 Echo: EF<20%; b. 06/2013 Cath: no signif dzs; c. 04/2017 Echo: 20-25%, gr1 DD.; d.05/2018 Echo: 25-30%  . Polysubstance abuse ([place])    a. etoh and tobacco  . Stroke [facility])    Past Surgical History:  Procedure Laterality Date  . NO PAST SURGERIES      Allergies  No Known Allergies  History of Present Illness    Shawn [name] is a 64 year old male with PMH as above and seen today in clinic for follow-up of nonischemic cardiomyopathy with recommendation to present from clinic to the ED. He underwent prior cardiac catheterization 06/2013 with recommendation for medical therapy. He has a history of poorly controlled insulin-dependent diabetes and prior strokes, polysubstance abuse including alcohol, and current tobacco abuse. He has had a history of chronic mild to moderate bilateral lower extremity edema, which typically worsened throughout the day. When last seen in the office, he  was taking Lasix 93m qAM. His weight usually fluctuated between 138-145 pounds at homeand withlastclinic weight 129lbs (09/26/2018). He had a noted poor diet, frequently eating out.  Over the holidays, the patient reports he ate and drank more than usual as he was more hungry than usual. He denies any alcohol use.He was not adding any salt to his food. Hewas stillsmoking approximately 10 cigarettes / day.He reports that he noted his weight rapidly increasing over the last few weeks, estimating at least 30-40 pounds of weight gain with clinic weight today 170 pounds and previous clinic weight 129 pounds. Also, overthe last three weeks, he noted progressive LEE. Initially, he noted bilateral weeping edema; however, he reports today that this has significantly improved over the last week or so. He also noted a full body "itchy rash" that began ~3 weeks ago, and which has been improving slowly since its onset and after seeing his PCP as outlined below.Noassociated chest pain, palpitations, or racing heart rate. No recent presyncope or syncope. No associated orthopnea, early satiety, SOB, DOE, or PND.  He states that he initially went to urgent care for these symptoms as outlined above in mid December 2020. He was seen mainly for his full body abdominal rash and given antibiotics, which improved his symptoms but did not resolve them completely. He then saw his PCP on 01/03/2019 for his ongoing symptoms and LEE/weight gain. Per review of this documentation, his rash has been ongoing for 2 weeks by that time and described as itchy  bumps for which he was using Benadryl and [place] cream without relief. It was thought that the rash was dermatitis and triamcinolone cream and antibiotic was prescribed. It was recommended he continue his Lasix and elevate his legs. On 01/12/2019, he underwent CT abdomen pelvis that showed small bilateral pleural effusions, moderate ascites, and anascara. Bibasilar  atelectasis was also noted and infiltrate not excluded. In addition, aortic atherosclerosis was noted. It was thought that gastroparesis was leading to some of his issues given his uncontrolled DM2, and he was then referred to GI. It was also recommended he take an additional half of tablet of lasix for the next 3-4 days to help with his abdominal swelling.He saw GI 1/14 (today) with immediate referral to Cardiology.   He reports today that(before his most recent increase in Lasix),hewastaking his Lasix 80 mg in the morning andLasix 40 mg at night. It is unclear when this dose increase occurred, given he denies self increasing his diuretic. With his last PCP increase, he has been taking 17m BID with subsequent decreased urine output.On exam today, the patient continues to note bilateral lower extremity edema and abdominal distention with physical exam showing diffuse 2-3+ pitting bilateral lower extremity edema up to the level of the hip and significant abdominal distention. He continues to deny any orthopnea, early satiety, or paroxysmal nocturnal dyspnea. He states today that "this does not feel like when I have fluid on my lungs." Nochest pain, palpitations, or racing heart rate.He reports poor urine output, despite taking his Lasix. No s/sx consistent with acute bleeding. No recent changes in bowel movements. EKG showed NSR, 83bpm, poor R wave progression in inferior and precordial leads, LVH criteria met in aVL. Of note, ReDS vest was attempted in clinic but unable to get a clear read or results. Due to the above, recommendation was to present directly to the ED.  Home Medications    Prior to Admission medications   Medication Sig Start Date End Date Taking? Authorizing Provider  aspirin 81 MG chewable tablet Chew 1 tablet (81 mg total) by mouth daily. 04/30/17  Yes CDemetrios Loll MD  atorvastatin (LIPITOR) 40 MG tablet Take 0.5 tablets (20 mg total) by mouth at bedtime. 09/26/18  Yes GMinna Merritts MD  B-Complex-C TABS Take 1 tablet by mouth daily.   Yes [provider]  baclofen (LIORESAL) 10 MG tablet Take 10 mg by mouth 2 (two) times daily as needed for muscle spasms. Muscle spasm   Yes [provider]  carvedilol (COREG) 12.5 MG tablet TAKE 1 TABLET BY MOUTH TWICE A DAY WITH A MEAL 09/26/18  Yes Gollan, TKathlene November MD  furosemide (LASIX) 40 MG tablet Take 1 tablet (40 mg total) by mouth daily. Patient taking differently: Takes 80 mg am and 40 mg pm qd. 09/26/18  Yes Gollan, TKathlene November MD  hydrALAZINE (APRESOLINE) 100 MG tablet Take 100 mg by mouth 3 (three) times daily.    Yes [provider]  insulin aspart (NOVOLOG) 100 UNIT/ML injection Inject 5 Units into the skin 3 (three) times daily with meals.   Yes [provider]  insulin glargine (LANTUS) 100 UNIT/ML injection Inject 15 Units into the skin at bedtime.   Yes [provider]  metolazone (ZAROXOLYN) 5 MG tablet Take 1 tablet (5 mg) by mouth x 1 dose, 30 minutes prior to morning lasix on days your weight is 150 lbs or above Patient taking differently: Take 1/2 tablet (5 mg) by mouth x 1 dose, 30  minutes prior to morning lasix on days your weight is 150 lbs or above as neeeded QOD 05/12/18  Yes Gollan, Kathlene November, MD  potassium chloride SA (K-DUR) 20 MEQ tablet Take 1 tablet (20 mEq total) by mouth daily. 09/26/18  Yes Gollan, Kathlene November, MD  sacubitril-valsartan (ENTRESTO) 97-103 MG Take 1 tablet by mouth 2 (two) times daily. 06/29/18  Yes Minna Merritts, MD  spironolactone (ALDACTONE) 25 MG tablet Take 25 mg by mouth at bedtime.   Yes [provider]    Review of Systems    He denies chest pain, palpitations, dyspnea, pnd, orthopnea, n, v, dizziness, syncope, or early satiety. He reports progressive bilateral LEE x3 weeks, abdominal distention, previous full body rash and bilateral weeping LEE, increased weight, and increased appetite.  All other systems reviewed and are  otherwise negative except as noted above.  Physical Exam    VS:  BP 110/64 (BP Location: Left Arm, Patient Position: Sitting, Cuff Size: Normal)   Ht '5\' 11"'$  (1.803 m)   Wt 170 lb (77.1 kg)   SpO2 94%   BMI 23.71 kg/m  , BMI Body mass index is 23.71 kg/m. GEN: Well nourished, well developed, in no acute distress. HEENT: normal. Neck: Supple, no JVD, carotid bruits, or masses. Cardiac: RRR, no murmurs, rubs, or gallops. No clubbing, cyanosis.  Radials/DP/PT 2+ and equal bilaterally. 3+ bilateral pitting edema up extending from tibia up through thigh and to the level of the hip hip.  Respiratory:  Respirations regular and unlabored. Bibasilar reduced breath sounds. GI: Firm, distended, abdominal vessels prominent MS: no deformity or atrophy.  3+ bilateral pitting edema up extending from tibia up through thigh and to the level of the hip hip. Skin: warm and dry, no rash. Neuro:  Strength and sensation are intact. Psych: Normal affect.  Accessory Clinical Findings    ECG personally reviewed by me today - NSR, 83bpm, poor R wave progression in inferior and precordial leads, LVH criteria met in aVL - no acute changes.  Filed Weights   01/18/19 1007  Weight: 170 lb (77.1 kg)     Echo 05/11/2018  1. The left ventricle has severely reduced systolic function, with an ejection fraction of 25-30%. The cavity size was mildly dilated. There is mildly increased left ventricular wall thickness. Left ventricular diastolic Doppler parameters are  consistent with pseudonormalization. Left ventrical global hypokinesis without regional wall motion abnormalities.  2. The right ventricle has normal systolic function. The cavity was normal. There is no increase in right ventricular wall thickness.Unable to estimate RVSP  3. Left atrial size was mildly dilated.  4. Mitral valve regurgitation is mild to moderate  Last labs 09/2018: Sodium 135, potassium 4.1, creatinine 1.22, BUN 46, WBC 7.5, hemoglobin  11.1, hematocrit 32.6, hemoglobin A1c 12.4, TSH 1.705  [facility] Labs 12/2018: Glucose 417, sodium 140, potassium 4.0, creatinine 1.4, BUN 32, AST 14, ALT 8, albumin 3.3; Hgb A1C 11.6, Hgb 10.3, HCT 32.9, RBC 3.60   Assessment & Plan    Bilateral LEE --Reports progressive bilateral lower extremity edema and abdominal distention.  Denies SOB or DOE despite bilateral LEE, abdominal distention, and weight gain of approximately 30-40lbs. Also denies orthopnea. Does note a full body rash, which is resolving. Over the last month, he has seen urgent care and his PCP with prescribed antibiotics, triamcinolone cream, and increase in lasix providing minimal relief.  In addition, he reports decreased urine output.  CT scan performed as outlined above. ReDS vest attempted today  and unable to supply reading. Given his progressive LEE despite outpatient increased diuresis (though unclear when the increase of diuresis occurred) with subsequent poor urine output and no recheck of renal function, recommend present to the ED to recheck his renal function with a BMET before proceeding with IV diuresis and consult nephrology if needed.   NICM, HFrEF (25-30%)  --No anginal symptoms. Prior catheterization in 06/2013 with recommendation for medical management. 05/2018 echo as above with EF 25-30%, mild LVH, global LV hypokinesis without regional wall motion abnormalities, mild LAE, and mild to moderate MR. Despite escalation of outpatient diuresis, he remains volume overloaded on exam today and reports no urine output; therefore, he has failed outpatient diuresis. As above, present to the ED for IV diuresis with close monitoring of renal function.   DM2, insulin dependent --Poorly controlled and followed by [facility]. Most recent 12/2018 labs do show slight improvement in Hgb A1C from 12.4 to 11.6.  Current Smoker, History of Alcohol Use --Denies any recent alcohol use. Continues to smoke ~10 cigarettes / day.  Cessation advised.   Disposition: Present to the ED for further evaluation.   Arvil Chaco, PA-C 01/18/2019, 10:26 AM

## 2019-01-18 NOTE — Progress Notes (Signed)
Dr. Posey Pronto aware of elevated BP and CBG. Order for 25 units of novolog now and she will place sliding scale orders for later.

## 2019-01-18 NOTE — Progress Notes (Addendum)
Inpatient Diabetes Program Recommendations  AACE/ADA: New Consensus Statement on Inpatient Glycemic Control (2015)  Target Ranges:  Prepandial:   less than 140 mg/dL      Peak postprandial:   less than 180 mg/dL (1-2 hours)      Critically ill patients:  140 - 180 mg/dL   Lab Results  Component Value Date   GLUCAP 242 (H) 09/23/2018   HGBA1C 12.4 (H) 09/23/2018      Diabetes history: DM2 Outpatient Diabetes medications: Lantus 12 daily + Novolog 5 units TID    with meals-Per [facility] note on 01/03/19                                              Current orders for Inpatient glycemic control: None   Inpatient Diabetes Program Recommendations:     -Please consider Lantus 15 units daily + Novolog 0-9 TID -Current A1C  Thank you, Reche Dixon, RN, BSN Diabetes Coordinator Inpatient Diabetes Program [phone] (team pager from 8a-5p)

## 2019-01-18 NOTE — ED Notes (Addendum)
Called lab to notify about add-on hepatic panel to previously collected specimen.

## 2019-01-18 NOTE — ED Triage Notes (Signed)
Pt presents to ED from [facility] for fluid in his abdomen and behind his lungs. Pt states 65yr hx of CHF. Pt denies CP, SOB, denies abdominal pain. Pt states abdominal swelling and BLE edema at this time.

## 2019-01-19 ENCOUNTER — Inpatient Hospital Stay: Payer: No Typology Code available for payment source

## 2019-01-19 LAB — BASIC METABOLIC PANEL
Anion gap: 9 (ref 5–15)
BUN: 44 mg/dL — ABNORMAL HIGH (ref 8–23)
CO2: 31 mmol/L (ref 22–32)
Calcium: 8.8 mg/dL — ABNORMAL LOW (ref 8.9–10.3)
Chloride: 100 mmol/L (ref 98–111)
Creatinine, Ser: 2.31 mg/dL — ABNORMAL HIGH (ref 0.61–1.24)
GFR calc Af Amer: 34 mL/min — ABNORMAL LOW (ref >60)
GFR calc non Af Amer: 29 mL/min — ABNORMAL LOW (ref >60)
Glucose, Bld: 149 mg/dL — ABNORMAL HIGH (ref 70–99)
Potassium: 3.9 mmol/L (ref 3.5–5.1)
Sodium: 140 mmol/L (ref 135–145)

## 2019-01-19 LAB — URINALYSIS, ROUTINE W REFLEX MICROSCOPIC
Bacteria, UA: NONE SEEN
Bilirubin Urine: NEGATIVE
Glucose, UA: >=500 mg/dL — AB
Hgb urine dipstick: NEGATIVE
Ketones, ur: NEGATIVE mg/dL
Leukocytes,Ua: NEGATIVE
Nitrite: NEGATIVE
Protein, ur: 30 mg/dL — AB
Specific Gravity, Urine: 1.007 (ref 1.005–1.030)
Squamous Epithelial / HPF: NONE SEEN (ref 0–5)
pH: 6 (ref 5.0–8.0)

## 2019-01-19 LAB — GLUCOSE, CAPILLARY
Glucose-Capillary: 107 mg/dL — ABNORMAL HIGH (ref 70–99)
Glucose-Capillary: 122 mg/dL — ABNORMAL HIGH (ref 70–99)
Glucose-Capillary: 178 mg/dL — ABNORMAL HIGH (ref 70–99)
Glucose-Capillary: 94 mg/dL (ref 70–99)

## 2019-01-19 LAB — PROTEIN / CREATININE RATIO, URINE
Creatinine, Urine: 48 mg/dL
Protein Creatinine Ratio: 0.9 mg/mg{Cre} — ABNORMAL HIGH (ref 0.00–0.15)
Total Protein, Urine: 43 mg/dL

## 2019-01-19 LAB — SARS CORONAVIRUS 2 (TAT 6-24 HRS): SARS Coronavirus 2: NEGATIVE

## 2019-01-19 MED ORDER — ENOXAPARIN SODIUM 40 MG/0.4ML ~~LOC~~ SOLN
40.0000 mg | SUBCUTANEOUS | Status: DC
  Filled 2019-01-19: qty 0.4

## 2019-01-19 MED ORDER — SPIRONOLACTONE 25 MG PO TABS: 25.0000 mg | ORAL_TABLET | Freq: Every day | ORAL | Status: DC

## 2019-01-19 MED ORDER — ENOXAPARIN SODIUM 30 MG/0.3ML ~~LOC~~ SOLN: 30.0000 mg | SUBCUTANEOUS | Status: DC

## 2019-01-19 MED ORDER — POTASSIUM CHLORIDE CRYS ER 10 MEQ PO TBCR
10.0000 meq | EXTENDED_RELEASE_TABLET | Freq: Every day | ORAL | Status: DC
  Administered 2019-01-19 – 2019-01-20 (×2): 10 meq via ORAL
  Filled 2019-01-19 (×2): qty 1

## 2019-01-19 MED ORDER — ASPIRIN 81 MG PO CHEW
81.0000 mg | CHEWABLE_TABLET | Freq: Every day | ORAL | Status: DC
  Administered 2019-01-19 – 2019-01-20 (×2): 81 mg via ORAL
  Filled 2019-01-19 (×2): qty 1

## 2019-01-19 NOTE — Progress Notes (Signed)
PROGRESS NOTE    Shawn [name]  [id number] DOB: 1955/04/29 DOA: 01/18/2019 PCP: [facility]      Brief Narrative:  Shawn [name] is a 64 y.o. M with sCHF EF 20-25%, DM, HTN and hx stroke who presented with abdominal fullness and dyspnea and leg swelling, progressive over about 3 weeks.  Patient notes dry weight is around 140 pounds, but that since Christmas, he has gained about 25 pounds, associated with significant leg edema, abdominal fullness, and feeling orthopnea and chest heaviness.  In the ER, respirations and pulse ox normal, but BNP 283, chest x-ray small pleural effusions, and markedly fluid overloaded on exam.  Started on IV Lasix and the hospital service were asked to evaluate for further management.      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  Started on IV Lasix drip Net -1.7 L yesterday, creatinine improved, potassium stable -Continue Lasix drip -Start K supplement -Strict I/Os, daily weights, telemetry  -Daily monitoring renal function -Consult Cardiology, appreciate cares   -Hold home furosemide and metolazone while on Lasix drip   Acute kidney injury Baseline creatinine is now around 1-1.2.  On admission it was 2.9 mg/dL.  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  This is improved overnight with diuresis. -Daily BMP -Appreciate nephrology assistance -Hold Entresto -Resume spironolactone  Diabetes Glucose is markedly elevated at admission.  Hemoglobin A1c 11%.  Poorly controlled. Glucose better this morning -Continue Lantus -Continue sliding scale corrections -Continue atorvastatin, aspirin  Hypertension Blood pressure soft -Continue carvedilol -Hold hydralazine, Entresto -Resume spiro   Anemia Normocytic, probably from chronic disease, no evidence of clinical bleeding.        Disposition: The patient was admitted with a severe exacerbation of his chronic congestive heart failure.   He is 25 pounds over his dry weight, and has begun to diurese.  I will discharge when he is closer to his dry weight, but has demonstrated adequate diuresis on oral diuretics.        MDM: The below labs and imaging reports were reviewed and summarized above.  Medication management as above.  This is a severe exacerbation of his chronic disease, requiring Lasix drip.      DVT prophylaxis: Lovenox, dose reduced Code Status: FULL Family Communication:     Consultants:   Cardiology  Nephrology  Procedures:   1/14 chest x-ray-small effusions  Antimicrobials:       Subjective: Patient is feeling somewhat better, his swelling is still there but improved.  He still feels like he has a lot of fluid in his abdomen.  No chest pain, palpitations, fever, cough.  Objective: Vitals:   01/18/19 1719 01/18/19 1857 01/18/19 1919 01/19/19 0334  BP: (!) 174/109 111/60 (!) 122/59 113/69  Pulse: 78 79 81 73  Resp: 18  16 18   Temp:   97.6 F (36.4 C) 98.3 F (36.8 C)  TempSrc:   Oral Oral  SpO2: 100%  96% 100%  Weight:    76 kg  Height:        Intake/Output Summary (Last 24 hours) at 01/19/2019 0826 Last data filed at 01/19/2019 0612 Gross per 24 hour  Intake 47.67 ml  Output 1775 ml  Net -1727.33 ml   Filed Weights   01/18/19 1145 01/18/19 1716 01/19/19 0334  Weight: 77.1 kg 77.1 kg 76 kg    Examination: General appearance: Thin adult male, alert  and in no acute distress.  Sitting up in bed HEENT: Anicteric, conjunctiva pink, lids and lashes normal. No nasal deformity, discharge, epistaxis.  Lips moist, dentition normal, oropharynx moist, no oral lesions.   Skin: Warm and dry.  No jaundice.  No suspicious rashes or lesions.  He does have a lacy reticular rash on most of his abdome and legs, this seems to be post inflammatory hyperpigmentation, or venous  stasis changes Cardiac: RRR, nl S1-S2, no murmurs appreciated.  Capillary refill is brisk.  JVP elevated.  2+ LE edema.  Radial pulses 2+ and symmetric. Respiratory: Normal respiratory rate and rhythm.  Crackles at bilateral bases, no wheezing. Abdomen: Abdomen soft.  Pitting edema in the back.  No TTP or guarding.  Mild cardiac ascites, no distension, hepatosplenomegaly.   MSK: No deformities or effusions. Neuro: Awake and alert.  EOMI, moves all extremities. Speech fluent.    Psych: Sensorium intact and responding to questions, attention normal. Affect normal.  Judgment and insight appear normal.    Data Reviewed: I have personally reviewed following labs and imaging studies:  CBC: Recent Labs  Lab 01/18/19 1147  WBC 6.2  HGB 11.0*  HCT 35.1*  MCV 88.4  PLT 123456*   Basic Metabolic Panel: Recent Labs  Lab 01/18/19 1147 01/19/19 0625  NA 138 140  K 4.9 3.9  CL 99 100  CO2 27 31  GLUCOSE 414* 149*  BUN 44* 44*  CREATININE 2.93* 2.31*  CALCIUM 8.8* 8.8*   GFR: Estimated Creatinine Clearance: 35.3 mL/min (A) (by C-G formula based on SCr of 2.31 mg/dL (H)). Liver Function Tests: Recent Labs  Lab 01/18/19 1339  AST 23  ALT 10  ALKPHOS 90  BILITOT 0.8  PROT 6.4*  ALBUMIN 3.0*   No results for input(s): LIPASE, AMYLASE in the last 168 hours. No results for input(s): AMMONIA in the last 168 hours. Coagulation Profile: No results for input(s): INR, PROTIME in the last 168 hours. Cardiac Enzymes: No results for input(s): CKTOTAL, CKMB, CKMBINDEX, TROPONINI in the last 168 hours. BNP (last 3 results) No results for input(s): PROBNP in the last 8760 hours. HbA1C: Recent Labs    01/18/19 1734  HGBA1C 11.0*   CBG: Recent Labs  Lab 01/18/19 1716 01/18/19 2106  GLUCAP 511* 428*   Lipid Profile: No results for input(s): CHOL, HDL, LDLCALC, TRIG, CHOLHDL, LDLDIRECT in the last 72 hours. Thyroid Function Tests: No results for input(s): TSH, T4TOTAL, FREET4,  T3FREE, THYROIDAB in the last 72 hours. Anemia Panel: No results for input(s): VITAMINB12, FOLATE, FERRITIN, TIBC, IRON, RETICCTPCT in the last 72 hours. Urine analysis:    Component Value Date/Time   COLORURINE YELLOW (A) 01/19/2019 0100   APPEARANCEUR CLEAR (A) 01/19/2019 0100   LABSPEC 1.007 01/19/2019 0100   PHURINE 6.0 01/19/2019 0100   GLUCOSEU >=500 (A) 01/19/2019 0100   HGBUR NEGATIVE 01/19/2019 0100   BILIRUBINUR NEGATIVE 01/19/2019 0100   KETONESUR NEGATIVE 01/19/2019 0100   PROTEINUR 30 (A) 01/19/2019 0100   NITRITE NEGATIVE 01/19/2019 0100   LEUKOCYTESUR NEGATIVE 01/19/2019 0100   Sepsis Labs: @LABRCNTIP (procalcitonin:4,lacticacidven:4)  ) Recent Results (from the past 240 hour(s))  SARS CORONAVIRUS 2 (TAT 6-24 HRS) Nasopharyngeal Nasopharyngeal Swab     Status: None   Collection Time: 01/18/19  3:52 PM   Specimen: Nasopharyngeal Swab  Result Value Ref Range Status   SARS Coronavirus 2 NEGATIVE NEGATIVE Final    Comment: (NOTE) SARS-CoV-2 target nucleic acids are NOT DETECTED. The SARS-CoV-2 RNA is generally detectable in upper  and lower respiratory specimens during the acute phase of infection. Negative results do not preclude SARS-CoV-2 infection, do not rule out co-infections with other pathogens, and should not be used as the sole basis for treatment or other patient management decisions. Negative results must be combined with clinical observations, patient history, and epidemiological information. The expected result is Negative. Fact Sheet for Patients: SugarRoll.be Fact Sheet for Healthcare Providers: https://www.woods-mathews.com/ This test is not yet approved or cleared by the [place] FDA and  has been authorized for detection and/or diagnosis of SARS-CoV-2 by FDA under an Emergency Use Authorization (EUA). This EUA will remain  in effect (meaning this test can be used) for the duration of the COVID-19  declaration under Section 56 4(b)(1) of the Act, 21 U.S.C. section 360bbb-3(b)(1), unless the authorization is terminated or revoked sooner. Performed at [facility], [place] [address], [place], [place] [address]          Radiology Studies: DG Chest 2 View  Result Date: 01/18/2019 CLINICAL DATA:  Pt [facility] from [facility] for fluid in his abdomen and behind his lungs. Pt states 61yr hx of CHF. Pt denies CP, SOB, denies abdominal pain. Pt states abdominal swelling and BLE edema at this time;Stroke, CKD, CHF, smoker EXAM: CHEST - 2 VIEW COMPARISON:  08/07/2018 FINDINGS: Cardiac silhouette normal in size. No mediastinal or hilar masses or evidence of adenopathy. Small pleural effusions. Mild opacity at the lung bases consistent with atelectasis. Minor scarring at the apices, stable. Lungs otherwise clear. No pneumothorax. Skeletal structures are intact. IMPRESSION: 1. Small pleural effusions with associated basilar atelectasis. 2. No evidence of pneumonia or pulmonary edema. Electronically Signed   By: Lajean Manes M.D.   On: 01/18/2019 12:35        Scheduled Meds: . atorvastatin  20 mg Oral QHS  . carvedilol  12.5 mg Oral BID WC  . heparin  5,000 Units Subcutaneous Q8H  . hydrALAZINE  100 mg Oral TID  . insulin aspart  0-15 Units Subcutaneous TID WC  . insulin aspart  0-5 Units Subcutaneous QHS  . insulin aspart  5 Units Subcutaneous TID WC  . insulin glargine  20 Units Subcutaneous QHS  . sodium chloride flush  3 mL Intravenous Q12H   Continuous Infusions: . sodium chloride    . furosemide (LASIX) infusion 4 mg/hr (01/19/19 0612)     LOS: 1 day    Time spent: 35 minutes    Edwin Dada, MD [facility] 01/19/2019, 8:26 AM     Please page though [place] or Epic secure chat:  For [employer], Adult nurse

## 2019-01-19 NOTE — Progress Notes (Signed)
PT Cancellation Note  Patient Details Name: Shawn [name] MRN: [id number] DOB: 1955-04-08   Cancelled Treatment:    Reason Eval/Treat Not Completed: (Consult received and chart reviewed. Patient resting in bed with covers over head, but verbally responsive to therapist.  Does uncover head with continued conversation, but declines participation with therapy at this time.  Reports busy day with "people in and out of here all day long".  Requests therapist re-attempt at later time/date as appropriate.)  Of note, patient reports living with wife at baseline, single-story home with 3 steps to enter; ambulatory with [facility] as primary mobility.  No home O2.   Ke[place]on Garden H. Owens Shark, PT, DPT, NCS 01/19/19, 2:54 PM [phone]

## 2019-01-19 NOTE — Progress Notes (Signed)
Inpatient Diabetes Program Recommendations  AACE/ADA: New Consensus Statement on Inpatient Glycemic Control (2015)  Target Ranges:  Prepandial:   less than 140 mg/dL      Peak postprandial:   less than 180 mg/dL (1-2 hours)      Critically ill patients:  140 - 180 mg/dL   Lab Results  Component Value Date   GLUCAP 122 (H) 01/19/2019   HGBA1C 11.0 (H) 01/18/2019    Review of Glycemic Control  Results for Shawn [name], Shawn [name] (MRN [id number]) as of 01/19/2019 15:13  Ref. Range 01/19/2019 08:29 01/19/2019 11:56  Glucose-Capillary Latest Ref Range: 70 - 99 mg/dL 107 (H) 122 (H)    Diabetes history: DM2 Outpatient Diabetes medications: Lantus 30 units daily + Novolog 5 units TID with meals Current orders for Inpatient glycemic control: Lantus 20 daily + 0-9 TID + 0-5 HS + novolog 5 units TID with meals  Note:  Reviewed patient's current A1c of 11% (Average BS of 315 mg/dl).   Explained what a A1c is and what it measures. Also reviewed goal A1c with patient, importance of good glucose control @ home, and blood sugar goals.  He states his A1C is usually 7-8%.  He admits to drinking sugary drinks occasionally and he like sweets.  Encouraged patient to not drink any sugary drinks.  Reviewed plate method.  He checks his blood sugar 3 times a day.  No difficulties obtaining medications and he rotates sites.  He states he is current with Dr. Honor Junes.    Thank you, Reche Dixon, RN, BSN Diabetes Coordinator Inpatient Diabetes Program [phone] (team pager from 8a-5p)

## 2019-01-19 NOTE — Plan of Care (Signed)
  Problem: Education: Goal: Individualized Educational Video(s) Outcome: Progressing CHF videos completed.  Pt receptive to education and voices intent for compliance with dietary changes.   Problem: Cardiac: Goal: Ability to achieve and maintain adequate cardiopulmonary perfusion will improve Outcome: Progressing Diuresing but taut edema remains trunk down.

## 2019-01-19 NOTE — Progress Notes (Signed)
Progress Note  Patient Name: Shawn [name] Date of Encounter: 01/19/2019  Primary Cardiologist: Rockey Situ  Subjective   Documented UOP of 1.7 L since admission. Weight 77.1-->76 kg. Renal function improving on Lasix gtt. LEE and abdominal distension improving. No chest pain or SOB.   Inpatient Medications    Scheduled Meds: . aspirin  81 mg Oral Daily  . atorvastatin  20 mg Oral QHS  . carvedilol  12.5 mg Oral BID WC  . enoxaparin (LOVENOX) injection  40 mg Subcutaneous Q24H  . hydrALAZINE  100 mg Oral TID  . insulin aspart  0-15 Units Subcutaneous TID WC  . insulin aspart  0-5 Units Subcutaneous QHS  . insulin aspart  5 Units Subcutaneous TID WC  . insulin glargine  20 Units Subcutaneous QHS  . potassium chloride  10 mEq Oral Daily  . sodium chloride flush  3 mL Intravenous Q12H  . spironolactone  25 mg Oral QHS   Continuous Infusions: . sodium chloride    . furosemide (LASIX) infusion 4 mg/hr (01/19/19 0612)   PRN Meds: sodium chloride, acetaminophen, ondansetron (ZOFRAN) IV, sodium chloride flush   Vital Signs    Vitals:   01/18/19 1857 01/18/19 1919 01/19/19 0334 01/19/19 0827  BP: 111/60 (!) 122/59 113/69 123/85  Pulse: 79 81 73 69  Resp:  16 18 19   Temp:  97.6 F (36.4 C) 98.3 F (36.8 C) 98.3 F (36.8 C)  TempSrc:  Oral Oral Oral  SpO2:  96% 100% 98%  Weight:   76 kg   Height:        Intake/Output Summary (Last 24 hours) at 01/19/2019 0928 Last data filed at 01/19/2019 0612 Gross per 24 hour  Intake 47.67 ml  Output 1775 ml  Net -1727.33 ml   Filed Weights   01/18/19 1145 01/18/19 1716 01/19/19 0334  Weight: 77.1 kg 77.1 kg 76 kg    Telemetry    SR - Personally Reviewed  ECG    No new tracings - Personally Reviewed  Physical Exam   GEN: No acute distress.   Neck: JVD elevated ~ 10 cm. Cardiac: RRR, no murmurs, rubs, or gallops.  Respiratory: Clear to auscultation bilaterally.  GI: Soft, nontender, distended.   MS: 2+ pitting  lower extremity edema; No deformity. Neuro:  Alert and oriented x 3; Nonfocal.  Psych: Normal affect.  Labs    Chemistry Recent Labs  Lab 01/18/19 1147 01/18/19 1339 01/19/19 0625  NA 138  --  140  K 4.9  --  3.9  CL 99  --  100  CO2 27  --  31  GLUCOSE 414*  --  149*  BUN 44*  --  44*  CREATININE 2.93*  --  2.31*  CALCIUM 8.8*  --  8.8*  PROT  --  6.4*  --   ALBUMIN  --  3.0*  --   AST  --  23  --   ALT  --  10  --   ALKPHOS  --  90  --   BILITOT  --  0.8  --   GFRNONAA 22*  --  29*  GFRAA 25*  --  34*  ANIONGAP 12  --  9     Hematology Recent Labs  Lab 01/18/19 1147  WBC 6.2  RBC 3.97*  HGB 11.0*  HCT 35.1*  MCV 88.4  MCH 27.7  MCHC 31.3  RDW 14.5  PLT 439*    Cardiac EnzymesNo results for input(s): TROPONINI in  the last 168 hours. No results for input(s): TROPIPOC in the last 168 hours.   BNP Recent Labs  Lab 01/18/19 1147 01/18/19 1734  BNP 221.0* 283.0*     DDimer No results for input(s): DDIMER in the last 168 hours.   Radiology    DG Chest 2 View  Result Date: 01/18/2019 IMPRESSION: 1. Small pleural effusions with associated basilar atelectasis. 2. No evidence of pneumonia or pulmonary edema. Electronically Signed   By: Lajean Manes M.D.   On: 01/18/2019 12:35    Cardiac Studies   2D Echo 05/2018: 1. The left ventricle has severely reduced systolic function, with an ejection fraction of 25-30%. The cavity size was mildly dilated. There is mildly increased left ventricular wall thickness. Left ventricular diastolic Doppler parameters are  consistent with pseudonormalization. Left ventrical global hypokinesis without regional wall motion abnormalities.  2. The right ventricle has normal systolic function. The cavity was normal. There is no increase in right ventricular wall thickness.Unable to estimate RVSP  3. Left atrial size was mildly dilated.  4. Mitral valve regurgitation is mild to moderate  Patient Profile     64 y.o. male with  history of Chronic combined systolic and diastolic CHF, NICM, CVA, DM2, HTN, alcohol and tobacco use, and dietary indiscretion who was admitted from the office with volume overload.   Assessment & Plan    1. Acute on chronic combined systolic and diastolic CHF/NICM/dilated CM: -He remains volume overloaded with a current weight of .weight 167.8 pounds with a dry weight around 130-145 pounds -Continue IV Lasix gtt with KCl repletion, will need several more days of IV diuresis  -Continue Coreg -Hold spironolactone in the setting of AKI, resume as renal function improves -Look to add losartan vs Entresto prior to discharge as renal function improves -Daily weights -Strict I/O -CHF education   2. AKI: -Likely from vascular congestion  -Improving with IV diuresis  -No indication for milrinone at this time  3. HTN: -Blood pressure well controlled -Continue Coreg and hydralazine -Hold spironolactone in the setting of AKI   For questions or updates, please contact [place] Please consult [email or web address] for contact info under Cardiology/STEMI.    Signed, Christell Faith, PA-C [facility] Pager: [phone] 01/19/2019, 9:28 AM

## 2019-01-19 NOTE — Plan of Care (Signed)
Nutrition Education Note  RD consulted for nutrition education regarding new onset CHF.  RD provided "Low Sodium Nutrition Therapy" handout from the Academy of [employer] Dietetics. Reviewed patient's dietary recall. Provided examples on ways to decrease sodium intake in diet. Discouraged intake of processed foods and use of salt shaker. Encouraged fresh fruits and vegetables as well as whole grain sources of carbohydrates to maximize fiber intake.   RD discussed why it is important for patient to adhere to diet recommendations, and emphasized the role of fluids, foods to avoid, and importance of weighing self daily. Teach back method used.  Expect good compliance.  Body mass index is 23.37 kg/m. Pt meets criteria for normal based on current BMI.  Current diet order is 2g Na, patient is consuming approximately 90% of meals at this time. Labs and medications reviewed. No further nutrition interventions warranted at this time. RD contact information provided. If additional nutrition issues arise, please re-consult RD.   Lajuan Lines, RD, [place] Clinical Nutrition Office Telephone [phone] After Hours/Weekend Pager: [phone]

## 2019-01-19 NOTE — Progress Notes (Signed)
PHARMACIST - PHYSICIAN COMMUNICATION  CONCERNING:  Enoxaparin (Lovenox) for DVT Prophylaxis    RECOMMENDATION: Patient was prescribed enoxaprin 30mg  q24 hours for VTE prophylaxis.   Filed Weights   01/18/19 1145 01/18/19 1716 01/19/19 0334  Weight: 170 lb (77.1 kg) 169 lb 14.4 oz (77.1 kg) 167 lb 8.8 oz (76 kg)    Body mass index is 23.37 kg/m.  Estimated Creatinine Clearance: 35.3 mL/min (A) (by C-G formula based on SCr of 2.31 mg/dL (H)).  Patient is candidate for enoxaparin 30mg  every 24 hours based on CrCl  > 20mL/min and Weight > 57 kg for men.   DESCRIPTION: Pharmacy has adjusted enoxaparin dose per [facility] policy.  Patient is now receiving enoxaparin 40mg  every 24 hours.   Sarely Stracener L, RPh 01/19/2019 8:40 AM

## 2019-01-19 NOTE — Progress Notes (Signed)
[facility]  ROUNDING NOTE   Subjective:   Patient states he is feeling better.   UOP 1733mL On furosemide gtt 4mg /hr  Creatinine 2.31 (2.93)  Objective:  Vital signs in last 24 hours:  Temp:  [97.6 F (36.4 C)-98.3 F (36.8 C)] 98.3 F (36.8 C) (01/15 0827) Pulse Rate:  [69-83] 69 (01/15 0827) Resp:  [14-20] 19 (01/15 0827) BP: (110-177)/(59-118) 123/85 (01/15 0827) SpO2:  [94 %-100 %] 98 % (01/15 0827) Weight:  [76 kg-77.1 kg] 76 kg (01/15 0334)  Weight change:  Filed Weights   01/18/19 1145 01/18/19 1716 01/19/19 0334  Weight: 77.1 kg 77.1 kg 76 kg    Intake/Output: I/O last 3 completed shifts: In: 47.7 [I.V.:47.7] Out: 1775 [Urine:1775]   Intake/Output this shift:  No intake/output data recorded.  Physical Exam: General: NAD,   Head: Normocephalic, atraumatic. Moist oral mucosal membranes  Eyes: Anicteric, PERRL  Neck: Supple, trachea midline  Lungs:  Bilateral basilar crackles  Heart: Regular rate and rhythm  Abdomen:  +distended  Extremities:  trace peripheral edema.  Neurologic: Nonfocal, moving all four extremities  Skin: No lesions         Basic Metabolic Panel: Recent Labs  Lab 01/18/19 1147 01/19/19 0625  NA 138 140  K 4.9 3.9  CL 99 100  CO2 27 31  GLUCOSE 414* 149*  BUN 44* 44*  CREATININE 2.93* 2.31*  CALCIUM 8.8* 8.8*    Liver Function Tests: Recent Labs  Lab 01/18/19 1339  AST 23  ALT 10  ALKPHOS 90  BILITOT 0.8  PROT 6.4*  ALBUMIN 3.0*   No results for input(s): LIPASE, AMYLASE in the last 168 hours. No results for input(s): AMMONIA in the last 168 hours.  CBC: Recent Labs  Lab 01/18/19 1147  WBC 6.2  HGB 11.0*  HCT 35.1*  MCV 88.4  PLT 439*    Cardiac Enzymes: No results for input(s): CKTOTAL, CKMB, CKMBINDEX, TROPONINI in the last 168 hours.  BNP: Invalid input(s): POCBNP  CBG: Recent Labs  Lab 01/18/19 1716 01/18/19 2106 01/19/19 0829  GLUCAP 511* 428* 107*     Microbiology: Results for orders placed or performed during the hospital encounter of 01/18/19  SARS CORONAVIRUS 2 (TAT 6-24 HRS) Nasopharyngeal Nasopharyngeal Swab     Status: None   Collection Time: 01/18/19  3:52 PM   Specimen: Nasopharyngeal Swab  Result Value Ref Range Status   SARS Coronavirus 2 NEGATIVE NEGATIVE Final    Comment: (NOTE) SARS-CoV-2 target nucleic acids are NOT DETECTED. The SARS-CoV-2 RNA is generally detectable in upper and lower respiratory specimens during the acute phase of infection. Negative results do not preclude SARS-CoV-2 infection, do not rule out co-infections with other pathogens, and should not be used as the sole basis for treatment or other patient management decisions. Negative results must be combined with clinical observations, patient history, and epidemiological information. The expected result is Negative. Fact Sheet for Patients: SugarRoll.be Fact Sheet for Healthcare Providers: https://www.woods-mathews.com/ This test is not yet approved or cleared by the [place] FDA and  has been authorized for detection and/or diagnosis of SARS-CoV-2 by FDA under an Emergency Use Authorization (EUA). This EUA will remain  in effect (meaning this test can be used) for the duration of the COVID-19 declaration under Section 56 4(b)(1) of the Act, 21 U.S.C. section 360bbb-3(b)(1), unless the authorization is terminated or revoked sooner. Performed at [facility], [place] [address] [place], [place] [address]     Coagulation Studies:  No results for input(s): LABPROT, INR in the last 72 hours.  Urinalysis: Recent Labs    01/19/19 0100  COLORURINE YELLOW*  LABSPEC 1.007  PHURINE 6.0  GLUCOSEU >=500*  HGBUR NEGATIVE  BILIRUBINUR NEGATIVE  KETONESUR NEGATIVE  PROTEINUR 30*  NITRITE NEGATIVE  LEUKOCYTESUR NEGATIVE      Imaging: DG Chest 2 View  Result Date: 01/18/2019 CLINICAL DATA:   Pt presents to ED from [facility] for fluid in his abdomen and behind his lungs. Pt states 10yr hx of CHF. Pt denies CP, SOB, denies abdominal pain. Pt states abdominal swelling and BLE edema at this time;Stroke, CKD, CHF, smoker EXAM: CHEST - 2 VIEW COMPARISON:  08/07/2018 FINDINGS: Cardiac silhouette normal in size. No mediastinal or hilar masses or evidence of adenopathy. Small pleural effusions. Mild opacity at the lung bases consistent with atelectasis. Minor scarring at the apices, stable. Lungs otherwise clear. No pneumothorax. Skeletal structures are intact. IMPRESSION: 1. Small pleural effusions with associated basilar atelectasis. 2. No evidence of pneumonia or pulmonary edema. Electronically Signed   By: Lajean Manes M.D.   On: 01/18/2019 12:35     Medications:   . sodium chloride    . furosemide (LASIX) infusion 4 mg/hr (01/19/19 0612)   . aspirin  81 mg Oral Daily  . atorvastatin  20 mg Oral QHS  . carvedilol  12.5 mg Oral BID WC  . enoxaparin (LOVENOX) injection  40 mg Subcutaneous Q24H  . hydrALAZINE  100 mg Oral TID  . insulin aspart  0-15 Units Subcutaneous TID WC  . insulin aspart  0-5 Units Subcutaneous QHS  . insulin aspart  5 Units Subcutaneous TID WC  . insulin glargine  20 Units Subcutaneous QHS  . potassium chloride  10 mEq Oral Daily  . sodium chloride flush  3 mL Intravenous Q12H  . spironolactone  25 mg Oral QHS   sodium chloride, acetaminophen, ondansetron (ZOFRAN) IV, sodium chloride flush  Assessment/ Plan:  Shawn [name] is a 64 y.o. black male with congestive heart failure, CVA, hypertension, diabetes mellitus type II insulin dependent, who was admitted to [facility] on 01/18/2019 for acute exacerbation of systolic congestive heart failure  1. Acute renal failure: baseline creatinine 1.4,  GFR greater than 60 on 01/03/2019. Most likely with underlying diabetic nephropathy with proteinuria and glycosuria.  Acute renal failure secondary to cardiorenal  syndrome - Pending renal ultrasound  2. Acute exacerbation of systolic congestive heart failure: echocardiogram on 05/11/2018 EF 25-30%.  home diuretic regimen of furosemide 40mg  bid, spironolactone and metolazone.   - Furosemide gtt - restarted spironolactone today  3. Hypertension: holding Entresto  - carvedilol, hydralazine and diuretics as above.    LOS: 1 Amiliana Foutz 1/15/20219:38 AM

## 2019-01-20 DIAGNOSIS — I10 Essential (primary) hypertension: Secondary | ICD-10-CM

## 2019-01-20 DIAGNOSIS — I429 Cardiomyopathy, unspecified: Secondary | ICD-10-CM

## 2019-01-20 LAB — BASIC METABOLIC PANEL
Anion gap: 12 (ref 5–15)
BUN: 40 mg/dL — ABNORMAL HIGH (ref 8–23)
CO2: 30 mmol/L (ref 22–32)
Calcium: 8.8 mg/dL — ABNORMAL LOW (ref 8.9–10.3)
Chloride: 95 mmol/L — ABNORMAL LOW (ref 98–111)
Creatinine, Ser: 1.75 mg/dL — ABNORMAL HIGH (ref 0.61–1.24)
GFR calc Af Amer: 47 mL/min — ABNORMAL LOW (ref >60)
GFR calc non Af Amer: 41 mL/min — ABNORMAL LOW (ref >60)
Glucose, Bld: 322 mg/dL — ABNORMAL HIGH (ref 70–99)
Potassium: 3.9 mmol/L (ref 3.5–5.1)
Sodium: 137 mmol/L (ref 135–145)

## 2019-01-20 LAB — GLUCOSE, CAPILLARY
Glucose-Capillary: 365 mg/dL — ABNORMAL HIGH (ref 70–99)
Glucose-Capillary: 284 mg/dL — ABNORMAL HIGH (ref 70–99)

## 2019-01-20 MED ORDER — TORSEMIDE 20 MG PO TABS
40.0000 mg | ORAL_TABLET | Freq: Two times a day (BID) | ORAL | Status: DC
  Administered 2019-01-20: 40 mg via ORAL
  Filled 2019-01-20: qty 2

## 2019-01-20 MED ORDER — TORSEMIDE 20 MG PO TABS: 40.0000 mg | ORAL_TABLET | Freq: Two times a day (BID) | ORAL | 3 refills | Status: DC

## 2019-01-20 MED ORDER — CARVEDILOL 25 MG PO TABS: 25.0000 mg | ORAL_TABLET | Freq: Two times a day (BID) | ORAL | Status: DC

## 2019-01-20 MED ORDER — TORSEMIDE 20 MG PO TABS: 40.0000 mg | ORAL_TABLET | Freq: Two times a day (BID) | ORAL | Status: DC

## 2019-01-20 MED ORDER — CARVEDILOL 25 MG PO TABS: 25.0000 mg | ORAL_TABLET | Freq: Two times a day (BID) | ORAL | 3 refills | Status: DC

## 2019-01-20 NOTE — Progress Notes (Signed)
Patient discharged to home. Tele and IV d/c'd. Patient verbalizes understanding of discharge instructions. 

## 2019-01-20 NOTE — Progress Notes (Signed)
[facility]  ROUNDING NOTE   Subjective:   Patient states he is feeling better.  States that he is able to walk around nursing station Currently on room air Denies any acute shortness of breath Still has some lower extremity edema  Objective:  Vital signs in last 24 hours:  Temp:  [98.3 F (36.8 C)] 98.3 F (36.8 C) (01/16 0746) Pulse Rate:  [73-90] 90 (01/16 0746) Resp:  [20] 20 (01/16 0746) BP: (110-152)/(80-88) 151/81 (01/16 0746) SpO2:  [93 %-95 %] 94 % (01/16 0746) Weight:  [72.7 kg] 72.7 kg (01/16 0411)  Weight change: -4.399 kg Filed Weights   01/18/19 1716 01/19/19 0334 01/20/19 0411  Weight: 77.1 kg 76 kg 72.7 kg    Intake/Output: I/O last 3 completed shifts: In: 527.9 [P.O.:480; I.V.:47.9] Out: 2725 [Urine:2725]   Intake/Output this shift:  Total I/O In: -  Out: 600 [Urine:600]  Physical Exam: General: NAD,   Head: Normocephalic, atraumatic. Moist oral mucosal membranes  Eyes: Anicteric,   Neck: Supple, trachea midline  Lungs:  Bilateral basilar crackles  Heart: Regular rate and rhythm  Abdomen:  +distended  Extremities:  2+ peripheral edema.  Neurologic: Nonfocal, moving all four extremities  Skin: No lesions         Basic Metabolic Panel: Recent Labs  Lab 01/18/19 1147 01/19/19 0625 01/20/19 0537  NA 138 140 137  K 4.9 3.9 3.9  CL 99 100 95*  CO2 27 31 30   GLUCOSE 414* 149* 322*  BUN 44* 44* 40*  CREATININE 2.93* 2.31* 1.75*  CALCIUM 8.8* 8.8* 8.8*    Liver Function Tests: Recent Labs  Lab 01/18/19 1339  AST 23  ALT 10  ALKPHOS 90  BILITOT 0.8  PROT 6.4*  ALBUMIN 3.0*   No results for input(s): LIPASE, AMYLASE in the last 168 hours. No results for input(s): AMMONIA in the last 168 hours.  CBC: Recent Labs  Lab 01/18/19 1147  WBC 6.2  HGB 11.0*  HCT 35.1*  MCV 88.4  PLT 439*    Cardiac Enzymes: No results for input(s): CKTOTAL, CKMB, CKMBINDEX, TROPONINI in the last 168 hours.  BNP: Invalid  input(s): POCBNP  CBG: Recent Labs  Lab 01/19/19 1156 01/19/19 1732 01/19/19 2042 01/20/19 0747 01/20/19 1139  GLUCAP 122* 178* 94 365* 284*    Microbiology: Results for orders placed or performed during the hospital encounter of 01/18/19  SARS CORONAVIRUS 2 (TAT 6-24 HRS) Nasopharyngeal Nasopharyngeal Swab     Status: None   Collection Time: 01/18/19  3:52 PM   Specimen: Nasopharyngeal Swab  Result Value Ref Range Status   SARS Coronavirus 2 NEGATIVE NEGATIVE Final    Comment: (NOTE) SARS-CoV-2 target nucleic acids are NOT DETECTED. The SARS-CoV-2 RNA is generally detectable in upper and lower respiratory specimens during the acute phase of infection. Negative results do not preclude SARS-CoV-2 infection, do not rule out co-infections with other pathogens, and should not be used as the sole basis for treatment or other patient management decisions. Negative results must be combined with clinical observations, patient history, and epidemiological information. The expected result is Negative. Fact Sheet for Patients: SugarRoll.be Fact Sheet for Healthcare Providers: https://www.woods-mathews.com/ This test is not yet approved or cleared by the [place] FDA and  has been authorized for detection and/or diagnosis of SARS-CoV-2 by FDA under an Emergency Use Authorization (EUA). This EUA will remain  in effect (meaning this test can be used) for the duration of the COVID-19 declaration under Section 56 4(b)(1) of  the Act, 21 U.S.C. section 360bbb-3(b)(1), unless the authorization is terminated or revoked sooner. Performed at [facility], [place] [address] [place], [place] [address]     Coagulation Studies: No results for input(s): LABPROT, INR in the last 72 hours.  Urinalysis: Recent Labs    01/19/19 0100  COLORURINE YELLOW*  LABSPEC 1.007  PHURINE 6.0  GLUCOSEU >=500*  HGBUR NEGATIVE  BILIRUBINUR NEGATIVE   KETONESUR NEGATIVE  PROTEINUR 30*  NITRITE NEGATIVE  LEUKOCYTESUR NEGATIVE      Imaging: US RENAL  Result Date: 01/19/2019 CLINICAL DATA:  Acute renal failure. EXAM: RENAL / URINARY TRACT ULTRASOUND COMPLETE COMPARISON:  CT 01/12/2019. FINDINGS: Right Kidney: Renal measurements: 10.3 x 5.3 x 6.0 cm = volume: 173 mL. Increased echogenicity. No mass or hydronephrosis visualized. Left Kidney: Renal measurements: 10.7 x 6.0 x 9.3 cm = volume: 179 mL. Increased echogenicity. No mass or hydronephrosis visualized. Bladder: Bladder appears distended. Other: Moderate ascites. IMPRESSION: 1. Increased renal echogenicity consistent with chronic medical renal disease. No hydronephrosis. 2.  Bladder slightly distended. 3.  Moderate ascites. Electronically Signed   By: Pritchett   On: 01/19/2019 10:53     Medications:   . sodium chloride     . aspirin  81 mg Oral Daily  . atorvastatin  20 mg Oral QHS  . carvedilol  25 mg Oral BID WC  . enoxaparin (LOVENOX) injection  40 mg Subcutaneous Q24H  . hydrALAZINE  100 mg Oral TID  . insulin aspart  0-15 Units Subcutaneous TID WC  . insulin aspart  0-5 Units Subcutaneous QHS  . insulin aspart  5 Units Subcutaneous TID WC  . insulin glargine  20 Units Subcutaneous QHS  . potassium chloride  10 mEq Oral Daily  . sodium chloride flush  3 mL Intravenous Q12H  . torsemide  40 mg Oral BID   sodium chloride, acetaminophen, ondansetron (ZOFRAN) IV, sodium chloride flush  Assessment/ Plan:  Shawn [name] is a 64 y.o. black male with congestive heart failure, CVA, hypertension, diabetes mellitus type II insulin dependent, who was admitted to [facility] [place] on 01/18/2019 for acute exacerbation of systolic congestive heart failure  1. Acute renal failure: baseline creatinine 1.4,  GFR greater than 60 on 01/03/2019. Most likely with underlying diabetic nephropathy with proteinuria and glycosuria.  Acute renal failure secondary to cardiorenal  syndrome -Serum creatinine has improved to 1.75 -We will arrange outpatient follow-up.  -Patient gave his wife's phone number Shawn [name].  [phone]  2. Acute exacerbation of systolic congestive heart failure: echocardiogram on 05/11/2018 EF 25-30%.  home diuretic regimen of furosemide 40mg  bid, spironolactone and metolazone.   -Discharged on spironolactone and torsemide 40 mg twice a day     LOS: 2 Bricyn Labrada 1/16/20215:31 PM

## 2019-01-20 NOTE — Progress Notes (Signed)
Progress Note  Patient Name: Shawn [name] Date of Encounter: 01/20/2019  Primary Cardiologist: Ida Rogue, MD   Subjective   Feels fine.  Lower extremity edema has improved significantly.  Breathing is normal.  Inpatient Medications    Scheduled Meds: . aspirin  81 mg Oral Daily  . atorvastatin  20 mg Oral QHS  . carvedilol  12.5 mg Oral BID WC  . enoxaparin (LOVENOX) injection  40 mg Subcutaneous Q24H  . hydrALAZINE  100 mg Oral TID  . insulin aspart  0-15 Units Subcutaneous TID WC  . insulin aspart  0-5 Units Subcutaneous QHS  . insulin aspart  5 Units Subcutaneous TID WC  . insulin glargine  20 Units Subcutaneous QHS  . potassium chloride  10 mEq Oral Daily  . sodium chloride flush  3 mL Intravenous Q12H  . torsemide  40 mg Oral BID   Continuous Infusions: . sodium chloride     PRN Meds: sodium chloride, acetaminophen, ondansetron (ZOFRAN) IV, sodium chloride flush   Vital Signs    Vitals:   01/19/19 1744 01/19/19 1944 01/20/19 0411 01/20/19 0746  BP: (!) 151/88 110/80 (!) 152/87 (!) 151/81  Pulse:  73 80 90  Resp:  20 20 20   Temp:  98.3 F (36.8 C) 98.3 F (36.8 C) 98.3 F (36.8 C)  TempSrc:  Oral Oral   SpO2:  95% 93% 94%  Weight:   72.7 kg   Height:        Intake/Output Summary (Last 24 hours) at 01/20/2019 1140 Last data filed at 01/20/2019 0416 Gross per 24 hour  Intake 243 ml  Output 1450 ml  Net -1207 ml   Last 3 Weights 01/20/2019 01/19/2019 01/18/2019  Weight (lbs) 160 lb 4.8 oz 167 lb 8.8 oz 169 lb 14.4 oz  Weight (kg) 72.712 kg 76 kg 77.066 kg      Telemetry    Sinus rhythm, heart rate 90s occasional PVCs.- Personally Reviewed  ECG    No new ECG obtained today  Physical Exam   GEN: No acute distress.   Neck: No JVD Cardiac: RRR, no murmurs, rubs, or gallops.  Respiratory: Clear to auscultation bilaterally.  Decreased breath sounds at bases GI: Soft, nontender, non-distended  MS: 1+ edema; No deformity. Neuro:   Nonfocal  Psych: Normal affect   Labs    High Sensitivity Troponin:  No results for input(s): TROPONINIHS in the last 720 hours.    Chemistry Recent Labs  Lab 01/18/19 1147 01/18/19 1339 01/19/19 0625 01/20/19 0537  NA 138  --  140 137  K 4.9  --  3.9 3.9  CL 99  --  100 95*  CO2 27  --  31 30  GLUCOSE 414*  --  149* 322*  BUN 44*  --  44* 40*  CREATININE 2.93*  --  2.31* 1.75*  CALCIUM 8.8*  --  8.8* 8.8*  PROT  --  6.4*  --   --   ALBUMIN  --  3.0*  --   --   AST  --  23  --   --   ALT  --  10  --   --   ALKPHOS  --  90  --   --   BILITOT  --  0.8  --   --   GFRNONAA 22*  --  29* 41*  GFRAA 25*  --  34* 47*  ANIONGAP 12  --  9 12     Hematology Recent Labs  Lab 01/18/19 1147  WBC 6.2  RBC 3.97*  HGB 11.0*  HCT 35.1*  MCV 88.4  MCH 27.7  MCHC 31.3  RDW 14.5  PLT 439*    BNP Recent Labs  Lab 01/18/19 1147 01/18/19 1734  BNP 221.0* 283.0*     DDimer No results for input(s): DDIMER in the last 168 hours.   Radiology    DG Chest 2 View  Result Date: 01/18/2019 CLINICAL DATA:  Pt presents to ED from [facility] for fluid in his abdomen and behind his lungs. Pt states 84yr hx of CHF. Pt denies CP, SOB, denies abdominal pain. Pt states abdominal swelling and BLE edema at this time;Stroke, CKD, CHF, smoker EXAM: CHEST - 2 VIEW COMPARISON:  08/07/2018 FINDINGS: Cardiac silhouette normal in size. No mediastinal or hilar masses or evidence of adenopathy. Small pleural effusions. Mild opacity at the lung bases consistent with atelectasis. Minor scarring at the apices, stable. Lungs otherwise clear. No pneumothorax. Skeletal structures are intact. IMPRESSION: 1. Small pleural effusions with associated basilar atelectasis. 2. No evidence of pneumonia or pulmonary edema. Electronically Signed   By: Lajean Manes M.D.   On: 01/18/2019 12:35   US RENAL  Result Date: 01/19/2019 CLINICAL DATA:  Acute renal failure. EXAM: RENAL / URINARY TRACT ULTRASOUND COMPLETE COMPARISON:   CT 01/12/2019. FINDINGS: Right Kidney: Renal measurements: 10.3 x 5.3 x 6.0 cm = volume: 173 mL. Increased echogenicity. No mass or hydronephrosis visualized. Left Kidney: Renal measurements: 10.7 x 6.0 x 9.3 cm = volume: 179 mL. Increased echogenicity. No mass or hydronephrosis visualized. Bladder: Bladder appears distended. Other: Moderate ascites. IMPRESSION: 1. Increased renal echogenicity consistent with chronic medical renal disease. No hydronephrosis. 2.  Bladder slightly distended. 3.  Moderate ascites. Electronically Signed   By: Marcello Moores  Register   On: 01/19/2019 10:53    Cardiac Studies   2D Echo 05/2018: 1. The left ventricle has severely reduced systolic function, with an ejection fraction of 25-30%. The cavity size was mildly dilated. There is mildly increased left ventricular wall thickness. Left ventricular diastolic Doppler parameters are  consistent with pseudonormalization. Left ventrical global hypokinesis without regional wall motion abnormalities. 2. The right ventricle has normal systolic function. The cavity was normal. There is no increase in right ventricular wall thickness.Unable to estimate RVSP 3. Left atrial size was mildly dilated. 4. Mitral valve regurgitation is mild to moderate  Patient Profile     64 y.o. male with history of nonischemic cardiomyopathy, last EF 25 to 30%, hypertension, alcohol and tobacco use presenting with fluid overload.  Patient was started on Lasix drip with good response.  Assessment & Plan    Good response with IV diuresing.  Has lost 11 pounds over the past 3 days.  Lots of ectopy/bigeminal rhythm noted on telemetry monitor.  Dry weight around 140 pounds.  1. Nonischemic cardiomyopathy, EF 25 to 30% -Start torsemide 40 mg twice daily -Increase Coreg to 25 mg twice daily.  Frequent ectopies on telemetry. -Continue strict ins and outs and daily weights -Holding PTA Entresto for now in light of renal function -Replete  electrolytes  2.  Hypertension -Coreg, torsemide, hydralazine 100 mg 3 times daily.  3.  Renal dysfunction -Improving with diuresing -Continue to monitor    Signed, Kate Sable, MD  01/20/2019, 11:40 AM

## 2019-01-20 NOTE — Progress Notes (Signed)
Physical Therapy Evaluation Patient Details Name: Shawn [name] MRN: [id number] DOB: 12-Aug-1955 Today's Date: 01/20/2019   History of Present Illness  Shawn [name]  is a 64 y.o. male with a known history of ischemic cardiomyopathy EF of 25 to 30%, poorly controlled type II diabetes, prior CVA, polysubstance abuse including alcohol in the past with current tobacco abuse comes in from [facility] [place] cardiology office with volume overload/weight gain of about 30 pounds and significant leg edema and abdominal tightness  Clinical Impression  Patient presents with decreased gait speed with RW and WFL  BLE strength. Patient has mild  balance deficit with dynamic standing and would benefit from RW to use with gait . Patient has no skilled PT needs at this time and will be DC fro PT.    Follow Up Recommendations No PT follow up    Equipment Recommendations  Rolling walker with 5" wheels    Recommendations for Other Services       Precautions / Restrictions Precautions Precautions: None      Mobility  Bed Mobility Overal bed mobility: Independent                Transfers Overall transfer level: Modified independent Equipment used: Rolling walker (2 wheeled)             General transfer comment: needs VC for safety  Ambulation/Gait Ambulation/Gait assistance: Modified independent (Device/Increase time) Gait Distance (Feet): 300 Feet Assistive device: Rolling walker (2 wheeled) Gait Pattern/deviations: Step-to pattern     General Gait Details: decreased gait speed  Stairs            Wheelchair Mobility    Modified Rankin (Stroke Patients Only)       Balance Overall balance assessment: Modified Independent  Decreased SLS and tandem stand                                            Pertinent Vitals/Pain Pain Assessment: No/denies pain    Home Living Family/patient expects to be discharged to:: Private residence Living Arrangements:  Spouse/significant other Available Help at Discharge: Family Type of Home: House Home Access: Stairs to enter Entrance Stairs-Rails: None Technical brewer of Steps: 3 Home Layout: One level        Prior Function Level of Independence: Independent with assistive device(s)               Hand Dominance   Dominant Hand: Left    Extremity/Trunk Assessment   Upper Extremity Assessment Upper Extremity Assessment: Overall WFL for tasks assessed    Lower Extremity Assessment Lower Extremity Assessment: Overall WFL for tasks assessed(3+/5 BLE)       Communication   Communication: No difficulties  Cognition Arousal/Alertness: Awake/alert Behavior During Therapy: WFL for tasks assessed/performed Overall Cognitive Status: Within Functional Limits for tasks assessed                                        General Comments      Exercises     Assessment/Plan    PT Assessment Patent does not need any further PT services  PT Problem List         PT Treatment Interventions      PT Goals (Current goals can be found in the Care Plan section)  Acute Rehab PT Goals Patient Stated Goal: to go home PT Goal Formulation: All assessment and education complete, DC therapy    Frequency     Barriers to discharge        Co-evaluation               AM-PAC PT "6 Clicks" Mobility  Outcome Measure Help needed turning from your back to your side while in a flat bed without using bedrails?: None Help needed moving from lying on your back to sitting on the side of a flat bed without using bedrails?: None Help needed moving to and from a bed to a chair (including a wheelchair)?: None Help needed standing up from a chair using your arms (e.g., wheelchair or bedside chair)?: None Help needed to walk in hospital room?: A Little Help needed climbing 3-5 steps with a railing? : A Little 6 Click Score: 22    End of Session Equipment Utilized During  Treatment: Gait belt Activity Tolerance: Patient tolerated treatment well Patient left: in bed;with bed alarm set Nurse Communication: Mobility status PT Visit Diagnosis: Muscle weakness (generalized) (M62.81);Difficulty in walking, not elsewhere classified (R26.2)    Time: [id number] PT Time Calculation (min) (ACUTE ONLY): 25 min   Charges:   PT Evaluation $PT Eval Low Complexity: 1 Low PT Treatments $Gait Training: 8-22 mins          Alanson Puls, PT DPT 01/20/2019, 10:37 AM

## 2019-01-20 NOTE — Discharge Summary (Signed)
Physician Discharge Summary  Shawn [name] [id number] DOB: 1955-05-24 DOA: 01/18/2019  PCP: [facility]  Admit date: 01/18/2019 Discharge date: 01/20/2019  Admitted From: Home  Disposition:  Home   Recommendations for Outpatient Follow-up:  1. Follow up with Heart Failure clinic in 10 days 2. Please obtain BMP in 7-10 days and restart Entresto and spironolactone if able      Home Health: None  Equipment/Devices: Rolling walker  Discharge Condition: Good  CODE STATUS: FULL Diet recommendation: Low sodium  Brief/Interim Summary: [name] is a 63 y.o. M with sCHF EF 20-25%, DM, HTN and hx stroke who presented with abdominal fullness and dyspnea and leg swelling, progressive over about 3 weeks.  Patient notes dry weight is around 140 pounds, but that since Christmas, he has gained about 25 pounds, associated with significant leg edema, abdominal fullness, and feeling orthopnea and chest heaviness.  In the ER, respirations and pulse ox normal, but BNP 283, chest x-ray small pleural effusions, and markedly fluid overloaded on exam.  Started on IV Lasix and the hospital service were asked to evaluate for further management.     PRINCIPAL HOSPITAL DIAGNOSIS: Acute on chronic systolic and diastolic CHF    Discharge Diagnoses:   Acute on chronic systolic and diastolic CHF Patient presented with 15 pound weight gain, severe fluid overload, acute kidney injury due to congestive nephropathy.    Started on IV Lasix drip.  Net negative 3.3L and swelling resolved clinically. Creatinine improving towards baseline. Patient eager for discharge, has close CHF clinic follow up.    Continue new torsemide 20 BID, increased dose carvedilol, and continue spironolactone, hydralazine.  Entresto and spironolactone held at discharge due to renal insufficiency.  Dietary counseling and weight paramaters counseled extensively.    Acute kidney injury Baseline creatinine  around 1-1.2.  On admission it was 2.9 mg/dL.  This was a congestive nephropathy that was resolving nicely with diuresis.    Check labs in 1 week.   Diabetes Hemoglobin A1c 11%.  Poorly controlled.  Hypertension Continue spironolactone, hydralazine, carvedilol.  Anemia Normocytic, probably from chronic kidney disease, no evidence of clinical bleeding.          Discharge Instructions  Discharge Instructions    (HEART FAILURE PATIENTS) Call MD:  Anytime you have any of the following symptoms: 1) 3 pound weight gain in 24 hours or 5 pounds in 1 week 2) shortness of breath, with or without a dry hacking cough 3) swelling in the hands, feet or stomach 4) if you have to sleep on extra pillows at night in order to breathe.   Complete by: As directed    Diet - low sodium heart healthy   Complete by: As directed    Discharge instructions   Complete by: As directed    From Dr. Loleta Books: You were admitted with fluid overload from congestive heart failure. You were treated with a Lasix drip and your swelling got better.  You should make the following adjustments to your medicines: STOP taking Lasix Instead, take torsemide (which is a different diuretic, the one Dr. Gwenyth Ober' partner told you about) Take torsemide 20 mg twice daily (in the morning and once at night) every day   WEIGH YOURSELF every day If you gain more than 3 lbs in a day or more than 5 lbs from your "dry weight" (the weight when you have all the extra water off), then call Dr. Gwenyth Ober' office asap, day or night.   For now, don't  take your Entresto or your metolazone. Ask the heart failure clinic when you see them on Jan 26 (see appointment below) if you should restart any of them Have the heart failure clinic check your lab work and kidney function    INCREASE your dose of carvedilol/Coreg --> instead of taking 12.5 mg twice daily, take Coreg 25 mg twice daily  Take your medicine list to the Heart failure  clinic and also to your primary care doctors office and make sure your list matches theirs.    Lastly, resume your diabetes medicines as before.  Resume your cholesterol (atorvastatin) medicine and hydralazine (blood pressure medicine) as before and resume your spironolactone/Aldactone as before.   Increase activity slowly   Complete by: As directed      Allergies as of 01/20/2019   No Known Allergies     Medication List    STOP taking these medications   furosemide 40 MG tablet Commonly known as: LASIX   metolazone 5 MG tablet Commonly known as: ZAROXOLYN   sacubitril-valsartan 97-103 MG Commonly known as: ENTRESTO     TAKE these medications   aspirin 81 MG chewable tablet Chew 1 tablet (81 mg total) by mouth daily.   atorvastatin 40 MG tablet Commonly known as: LIPITOR Take 0.5 tablets (20 mg total) by mouth at bedtime.   B-Complex-C Tabs Take 1 tablet by mouth daily.   baclofen 10 MG tablet Commonly known as: LIORESAL Take 10 mg by mouth 2 (two) times daily as needed for muscle spasms. Muscle spasm   carvedilol 25 MG tablet Commonly known as: COREG Take 1 tablet (25 mg total) by mouth 2 (two) times daily with a meal. What changed:   medication strength  how much to take  how to take this  when to take this  additional instructions   cetirizine 10 MG tablet Commonly known as: ZYRTEC Take 10 mg by mouth daily.   hydrALAZINE 100 MG tablet Commonly known as: APRESOLINE Take 100 mg by mouth 3 (three) times daily.   insulin aspart 100 UNIT/ML injection Commonly known as: novoLOG Inject 5 Units into the skin 3 (three) times daily with meals.   insulin glargine 100 UNIT/ML injection Commonly known as: LANTUS Inject 15 Units into the skin at bedtime.   omeprazole 20 MG capsule Commonly known as: PRILOSEC Take 20 mg by mouth daily.   potassium chloride SA 20 MEQ tablet Commonly known as: KLOR-CON Take 1 tablet (20 mEq total) by mouth daily.    spironolactone 25 MG tablet Commonly known as: ALDACTONE Take 25 mg by mouth at bedtime.   torsemide 20 MG tablet Commonly known as: DEMADEX Take 2 tablets (40 mg total) by mouth 2 (two) times daily.            Durable Medical Equipment  (From admission, onward)         Start     Ordered   01/20/19 1223  DME Walker  Once    Question Answer Comment  Walker: With 5 Inch Wheels   Patient needs a walker to treat with the following condition CHF (congestive heart failure) ([place])      01/20/19 1227         Follow-up Information    [facility] Follow up on 01/30/2019.   Specialty: Cardiology Why: at 2:00pm. Enter through the [place] entrance Contact information: [place],  [place]. Schedule an appointment as soon as possible for a visit in 1 week(s).   Specialty: General Practice Contact information: [address] [place] [address] [phone]          No Known Allergies  Consultations:  Cardiology  Nephrology   Procedures/Studies: DG Chest 2 View  Result Date: 01/18/2019 CLINICAL DATA:  Pt presents to ED from [facility] for fluid in his abdomen and behind his lungs. Pt states 34yr hx of CHF. Pt denies CP, SOB, denies abdominal pain. Pt states abdominal swelling and BLE edema at this time;Stroke, CKD, CHF, smoker EXAM: CHEST - 2 VIEW COMPARISON:  08/07/2018 FINDINGS: Cardiac silhouette normal in size. No mediastinal or hilar masses or evidence of adenopathy. Small pleural effusions. Mild opacity at the lung bases consistent with atelectasis. Minor scarring at the apices, stable. Lungs otherwise clear. No pneumothorax. Skeletal structures are intact. IMPRESSION: 1. Small pleural effusions with associated basilar atelectasis. 2. No evidence of pneumonia or pulmonary edema. Electronically Signed   By: Lajean Manes M.D.   On:  01/18/2019 12:35   CT ABDOMEN PELVIS W CONTRAST  Result Date: 01/12/2019 CLINICAL DATA:  64 year old male with abdominal bloating and nausea. EXAM: CT ABDOMEN AND PELVIS WITH CONTRAST TECHNIQUE: Multidetector CT imaging of the abdomen and pelvis was performed using the standard protocol following bolus administration of intravenous contrast. CONTRAST:  50mL OMNIPAQUE IOHEXOL 300 MG/ML  SOLN COMPARISON:  None. FINDINGS: Lower chest: Partially visualized small bilateral pleural effusions. There is associated partial compressive atelectasis of the lung bases. Pneumonia is not excluded. Clinical correlation is recommended. There is mild cardiomegaly. No intra-abdominal free air. There is moderate ascites. Hepatobiliary: The liver is unremarkable. No intrahepatic biliary ductal dilatation. The gallbladder is contracted. No calcified gallstone. Pancreas: Unremarkable. No pancreatic ductal dilatation or surrounding inflammatory changes. Spleen: Normal in size without focal abnormality. Adrenals/Urinary Tract: The adrenal glands are unremarkable. There is no hydronephrosis on either side. There is symmetric enhancement and excretion of contrast by both kidneys. The urinary bladder is grossly unremarkable. Stomach/Bowel: There is moderate stool throughout the colon. There is no bowel obstruction. The appendix is normal. Vascular/Lymphatic: Moderate aortoiliac atherosclerotic disease. The IVC is unremarkable. No portal venous gas. Reproductive: The prostate and seminal vesicles are grossly unremarkable. Other: Diffuse subcutaneous edema and anasarca. Musculoskeletal: No acute or significant osseous findings. IMPRESSION: 1. Small bilateral pleural effusions, moderate ascites, and anasarca. 2. Bibasilar atelectasis.  Infiltrate not excluded. 3. No bowel obstruction. Normal appendix. 4. Aortic Atherosclerosis (ICD10-I70.0). Electronically Signed   By: Anner Crete M.D.   On: 01/12/2019 19:36   US RENAL  Result Date:  01/19/2019 CLINICAL DATA:  Acute renal failure. EXAM: RENAL / URINARY TRACT ULTRASOUND COMPLETE COMPARISON:  CT 01/12/2019. FINDINGS: Right Kidney: Renal measurements: 10.3 x 5.3 x 6.0 cm = volume: 173 mL. Increased echogenicity. No mass or hydronephrosis visualized. Left Kidney: Renal measurements: 10.7 x 6.0 x 9.3 cm = volume: 179 mL. Increased echogenicity. No mass or hydronephrosis visualized. Bladder: Bladder appears distended. Other: Moderate ascites. IMPRESSION: 1. Increased renal echogenicity consistent with chronic medical renal disease. No hydronephrosis. 2.  Bladder slightly distended. 3.  Moderate ascites. Electronically Signed   By: Marcello Moores  Register   On: 01/19/2019 10:53      Subjective: Patient feeling well.  Swelling gone. Abdomen feels good.  No confusion, headache, chest pain.  Urine output good.  Discharge Exam: Vitals:   01/20/19 0411 01/20/19 0746  BP: (!) 152/87 (!) 151/81  Pulse: 80 90  Resp: 20 20  Temp: 98.3 F (36.8 C) 98.3 F (36.8 C)  SpO2: 93% 94%   Vitals:   01/19/19 1744 01/19/19 1944 01/20/19 0411 01/20/19 0746  BP: (!) 151/88 110/80 (!) 152/87 (!) 151/81  Pulse:  73 80 90  Resp:  20 20 20   Temp:  98.3 F (36.8 C) 98.3 F (36.8 C) 98.3 F (36.8 C)  TempSrc:  Oral Oral   SpO2:  95% 93% 94%  Weight:   72.7 kg   Height:        General: Pt is alert, awake, not in acute distress Cardiovascular: RRR, nl S1-S2, no murmurs appreciated.   No LE edema.   Respiratory: Normal respiratory rate and rhythm.  CTAB without rales or wheezes. Abdominal: Abdomen soft and non-tender.  No distension or HSM.   Neuro/Psych: Strength symmetric in upper and lower extremities.  Judgment and insight appear normal.   The results of significant diagnostics from this hospitalization (including imaging, microbiology, ancillary and laboratory) are listed below for reference.     Microbiology: Recent Results (from the past 240 hour(s))  SARS CORONAVIRUS 2 (TAT 6-24 HRS)  Nasopharyngeal Nasopharyngeal Swab     Status: None   Collection Time: 01/18/19  3:52 PM   Specimen: Nasopharyngeal Swab  Result Value Ref Range Status   SARS Coronavirus 2 NEGATIVE NEGATIVE Final    Comment: (NOTE) SARS-CoV-2 target nucleic acids are NOT DETECTED. The SARS-CoV-2 RNA is generally detectable in upper and lower respiratory specimens during the acute phase of infection. Negative results do not preclude SARS-CoV-2 infection, do not rule out co-infections with other pathogens, and should not be used as the sole basis for treatment or other patient management decisions. Negative results must be combined with clinical observations, patient history, and epidemiological information. The expected result is Negative. Fact Sheet for Patients: SugarRoll.be Fact Sheet for Healthcare Providers: https://www.woods-mathews.com/ This test is not yet approved or cleared by the [place] FDA and  has been authorized for detection and/or diagnosis of SARS-CoV-2 by FDA under an Emergency Use Authorization (EUA). This EUA will remain  in effect (meaning this test can be used) for the duration of the COVID-19 declaration under Section 56 4(b)(1) of the Act, 21 U.S.C. section 360bbb-3(b)(1), unless the authorization is terminated or revoked sooner. Performed at [facility], [place] [address], [place], [place] [address]      Labs: BNP (last 3 results) Recent Labs    03/21/18 0314 01/18/19 1147 01/18/19 1734  BNP 374.0* 221.0* [id number]*   Basic Metabolic Panel: Recent Labs  Lab 01/18/19 1147 01/19/19 0625 01/20/19 0537  NA 138 140 137  K 4.9 3.9 3.9  CL 99 100 95*  CO2 27 31 30   GLUCOSE 414* 149* 322*  BUN 44* 44* 40*  CREATININE 2.93* 2.31* 1.75*  CALCIUM 8.8* 8.8* 8.8*   Liver Function Tests: Recent Labs  Lab 01/18/19 1339  AST 23  ALT 10  ALKPHOS 90  BILITOT 0.8  PROT 6.4*  ALBUMIN 3.0*   No results for input(s):  LIPASE, AMYLASE in the last 168 hours. No results for input(s): AMMONIA in the last 168 hours. CBC: Recent Labs  Lab 01/18/19 1147  WBC 6.2  HGB 11.0*  HCT 35.1*  MCV 88.4  PLT 439*   Cardiac Enzymes: No results for input(s): CKTOTAL, CKMB, CKMBINDEX, TROPONINI in the last 168 hours. BNP: Invalid input(s): POCBNP CBG: Recent Labs  Lab 01/19/19 1156 01/19/19 1732 01/19/19 2042 01/20/19 0747 01/20/19 Coffee  122* 178* 94 365* 284*   D-Dimer No results for input(s): DDIMER in the last 72 hours. Hgb A1c Recent Labs    01/18/19 1734  HGBA1C 11.0*   Lipid Profile No results for input(s): CHOL, HDL, LDLCALC, TRIG, CHOLHDL, LDLDIRECT in the last 72 hours. Thyroid function studies No results for input(s): TSH, T4TOTAL, T3FREE, THYROIDAB in the last 72 hours.  Invalid input(s): FREET3 Anemia work up No results for input(s): VITAMINB12, FOLATE, FERRITIN, TIBC, IRON, RETICCTPCT in the last 72 hours. Urinalysis    Component Value Date/Time   COLORURINE YELLOW (A) 01/19/2019 0100   APPEARANCEUR CLEAR (A) 01/19/2019 0100   LABSPEC 1.007 01/19/2019 0100   PHURINE 6.0 01/19/2019 0100   GLUCOSEU >=500 (A) 01/19/2019 0100   HGBUR NEGATIVE 01/19/2019 0100   BILIRUBINUR NEGATIVE 01/19/2019 0100   KETONESUR NEGATIVE 01/19/2019 0100   PROTEINUR 30 (A) 01/19/2019 0100   NITRITE NEGATIVE 01/19/2019 0100   LEUKOCYTESUR NEGATIVE 01/19/2019 0100   Sepsis Labs Invalid input(s): PROCALCITONIN,  WBC,  LACTICIDVEN Microbiology Recent Results (from the past 240 hour(s))  SARS CORONAVIRUS 2 (TAT 6-24 HRS) Nasopharyngeal Nasopharyngeal Swab     Status: None   Collection Time: 01/18/19  3:52 PM   Specimen: Nasopharyngeal Swab  Result Value Ref Range Status   SARS Coronavirus 2 NEGATIVE NEGATIVE Final    Comment: (NOTE) SARS-CoV-2 target nucleic acids are NOT DETECTED. The SARS-CoV-2 RNA is generally detectable in upper and lower respiratory specimens during the acute phase of  infection. Negative results do not preclude SARS-CoV-2 infection, do not rule out co-infections with other pathogens, and should not be used as the sole basis for treatment or other patient management decisions. Negative results must be combined with clinical observations, patient history, and epidemiological information. The expected result is Negative. Fact Sheet for Patients: SugarRoll.be Fact Sheet for Healthcare Providers: https://www.woods-mathews.com/ This test is not yet approved or cleared by the [place] FDA and  has been authorized for detection and/or diagnosis of SARS-CoV-2 by FDA under an Emergency Use Authorization (EUA). This EUA will remain  in effect (meaning this test can be used) for the duration of the COVID-19 declaration under Section 56 4(b)(1) of the Act, 21 U.S.C. section 360bbb-3(b)(1), unless the authorization is terminated or revoked sooner. Performed at [facility], [place] [address], [place], [place] [address]      Time coordinating discharge: 25 minutes The [place] controlled substances registry was reviewed for this patient        SIGNED:   Edwin Dada, MD  [facility] 01/20/2019, 12:32 PM

## 2019-01-22 ENCOUNTER — Telehealth: Payer: Self-pay | Admitting: Family

## 2019-01-22 NOTE — Telephone Encounter (Signed)
Spoke with patient and his wife since we received a referral from the hospital for a New Patient appt. They confirmed their appt with [place] for 1/26 at 2pm. Patient stated he is doing well, following low sodium diet, and has no problems with meds or medical complaints at this time. He knows now to call us if he starts developing issues.    Alyse Low, [place]

## 2019-01-29 NOTE — Progress Notes (Signed)
Patient ID: Shawn [name], male    DOB: Apr 21, 1955, 64 y.o.   MRN: [id number]  HPI  Shawn [name] is a 64 y/o male with a history of DM, HTN, CKD, stroke, current tobacco use and chronic heart failure.   Echo report from 05/11/2018 reviewed and showed an EF of 25-30% along with mild/moderate Shawn.   Admitted 01/18/19 due to acute on chronic HF. Cardiology and nephrology consults obtained. Initially given IV lasix gtt and then transitioned to oral diuretics. Shawn [name] was stopped due to renal function. Discharged after 2 days.   He presents today for his initial visit with a chief complaint of minimal fatigue upon moderate exertion. He describes this as chronic in nature having been present for several years although does feel worse since his entresto was stopped due to worsening renal function. He has associated leg weakness and thigh myalgia along with this. He denies any dizziness, difficulty sleeping, abdominal distention, palpitations, pedal edema, chest pain, shortness of breath, cough or weight gain.   Past Medical History:  Diagnosis Date  . Chronic combined systolic (congestive) and diastolic (congestive) heart failure ([place])    a. TTE 6/15: EF < 20%, mildly dilated LV, DD, mildly dilated LA, mod dilated RA, mild Shawn, mild to mod TR, mod increased posterior wall thickness, elevated LA and LVEDP; b. 4/12019 Echo: EF 20-25%, diff HK. Gr1 DD, nl RV fxn.  . CKD (chronic kidney disease), stage II   . Diabetes mellitus with complication ([place])    a. Prior admissions w/ DKA (last 12/2017).  . Essential hypertension   . NICM (nonischemic cardiomyopathy) ([place])    a. 06/2013 Echo: EF<20%; b. 06/2013 Cath: no signif dzs; c. 04/2017 Echo: 20-25%, gr1 DD.; d.05/2018 Echo: 25-30%  . Polysubstance abuse ([place])    a. etoh and tobacco  . Stroke [facility])    Past Surgical History:  Procedure Laterality Date  . NO PAST SURGERIES     Family History  Problem Relation Age of Onset  . Congestive Heart Failure  Mother   . Diabetes Mother   . Congestive Heart Failure Brother    Social History   Tobacco Use  . Smoking status: Current Every Day Smoker    Packs/day: 0.50    Types: Cigarettes  . Smokeless tobacco: Never Used  Substance Use Topics  . Alcohol use: Not Currently   No Known Allergies Prior to Admission medications   Medication Sig Start Date End Date Taking? Authorizing Provider  aspirin 81 MG chewable tablet Chew 1 tablet (81 mg total) by mouth daily. 04/30/17  Yes Demetrios Loll, MD  atorvastatin (LIPITOR) 40 MG tablet Take 0.5 tablets (20 mg total) by mouth at bedtime. 09/26/18  Yes Minna Merritts, MD  B-Complex-C TABS Take 1 tablet by mouth daily.   Yes [provider]  baclofen (LIORESAL) 10 MG tablet Take 10 mg by mouth 2 (two) times daily as needed for muscle spasms. Muscle spasm   Yes [provider]  carvedilol (COREG) 25 MG tablet Take 1 tablet (25 mg total) by mouth 2 (two) times daily with a meal. 01/20/19  Yes Danford, Suann Larry, MD  cetirizine (ZYRTEC) 10 MG tablet Take 10 mg by mouth daily.   Yes [provider]  hydrALAZINE (APRESOLINE) 100 MG tablet Take 100 mg by mouth 3 (three) times daily.    Yes [provider]  insulin aspart (NOVOLOG) 100 UNIT/ML injection Inject 5 Units into the skin 3 (three) times daily with meals.  Yes [provider]  insulin glargine (LANTUS) 100 UNIT/ML injection Inject 15 Units into the skin at bedtime.   Yes [provider]  omeprazole (PRILOSEC) 20 MG capsule Take 20 mg by mouth daily. 12/18/18  Yes [provider]  potassium chloride SA (K-DUR) 20 MEQ tablet Take 1 tablet (20 mEq total) by mouth daily. 09/26/18  Yes Minna Merritts, MD  spironolactone (ALDACTONE) 25 MG tablet Take 25 mg by mouth at bedtime.   Yes [provider]  torsemide (DEMADEX) 20 MG tablet Take 2 tablets (40 mg total) by mouth 2 (two) times daily. 01/20/19  Yes Danford, Suann Larry, MD     Review of Systems  Constitutional: Positive for fatigue. Negative for appetite change.  HENT: Negative for congestion, postnasal drip and sore throat.   Eyes: Negative.   Respiratory: Negative for cough, chest tightness and shortness of breath.   Cardiovascular: Negative for chest pain, palpitations and leg swelling.  Gastrointestinal: Negative for abdominal distention and abdominal pain.  Endocrine: Negative.   Genitourinary: Negative.   Musculoskeletal: Positive for myalgias (thighs are sore). Negative for back pain and neck pain.  Skin: Negative.   Allergic/Immunologic: Negative.   Neurological: Positive for weakness (legs). Negative for dizziness and light-headedness.  Hematological: Negative for adenopathy. Does not bruise/bleed easily.  Psychiatric/Behavioral: Negative for dysphoric mood and sleep disturbance (sleeping on 2 pillows). The patient is not nervous/anxious.    Vitals:   01/30/19 1353  BP: (!) 152/84  Pulse: 80  Resp: 18  SpO2: 98%  Weight: 148 lb 6.4 oz (67.3 kg)  Height: 5\' 11"  (1.803 m)   Wt Readings from Last 3 Encounters:  01/30/19 148 lb 6.4 oz (67.3 kg)  01/20/19 160 lb 4.8 oz (72.7 kg)  01/18/19 170 lb (77.1 kg)   Lab Results  Component Value Date   CREATININE 1.75 (H) 01/20/2019   CREATININE 2.31 (H) 01/19/2019   CREATININE 2.93 (H) 01/18/2019    Physical Exam Vitals and nursing note reviewed.  Constitutional:      Appearance: Normal appearance.  HENT:     Head: Normocephalic and atraumatic.  Cardiovascular:     Rate and Rhythm: Normal rate and regular rhythm.  Pulmonary:     Effort: Pulmonary effort is normal.     Breath sounds: Normal breath sounds. No wheezing or rales.  Abdominal:     General: Abdomen is flat. There is no distension.     Palpations: Abdomen is soft.  Musculoskeletal:        General: No tenderness.     Cervical back: Normal range of motion and neck supple.     Right lower leg: No edema.     Left lower leg: No  edema.  Skin:    General: Skin is warm and dry.  Neurological:     General: No focal deficit present.     Mental Status: He is alert and oriented to person, place, and time.  Psychiatric:        Mood and Affect: Mood normal.        Behavior: Behavior normal.        Thought Content: Thought content normal.     Assessment & Plan:  1: Chronic heart failure with reduced ejection fraction- - NYHA class II - euvolemic today - weighing daily and says that his weight since discharge has been stable. Instructed him to call for an overnight weight gain of >2 pounds or a weekly weight gain of >5 pounds - not adding  salt and his wife is reading food labels closely for sodium content; reviewed the importance of closely following a low sodium diet; written dietary information and low sodium cookbook were given to the patient - patient and wife say he felt better while on entresto which was stopped during recent admission due to worsening renal function - will check BMP today to see if entresto can be resumed - saw cardiology Shawn [name] ) 01/18/19 - BNP 01/18/19 was 283.0 - received his flu vaccine for this season  2: HTN- - BP mildly elevated today - sees PCP at the [place] in 3 days - BMP 01/20/19 reviewed and showed sodium 137, potassium 3.9, creatinine 1.75 and GFR 47  3: DM- - saw endocrinology Shawn [name]) 01/03/2019  - A1c 01/18/19 was 11.0% - home glucose this morning was 83 fasting - sees nephrology in the next few weeks  4: Tobacco use- - smoking 1/2 ppd of cigarettes - cessation discussed for 3 minutes with him  Medication bottles were reviewed.   Return in 6 weeks or sooner for any questions/problems before then.

## 2019-01-30 ENCOUNTER — Encounter: Payer: Self-pay | Admitting: Family

## 2019-01-30 ENCOUNTER — Other Ambulatory Visit: Payer: Self-pay

## 2019-01-30 ENCOUNTER — Ambulatory Visit: Payer: No Typology Code available for payment source | Attending: Family | Admitting: Family

## 2019-01-30 VITALS — BP 152/84 | HR 80 | Resp 18 | Ht 71.0 in | Wt 148.4 lb

## 2019-01-30 DIAGNOSIS — Z794 Long term (current) use of insulin: Secondary | ICD-10-CM | POA: Insufficient documentation

## 2019-01-30 DIAGNOSIS — Z7982 Long term (current) use of aspirin: Secondary | ICD-10-CM | POA: Diagnosis not present

## 2019-01-30 DIAGNOSIS — I5042 Chronic combined systolic (congestive) and diastolic (congestive) heart failure: Secondary | ICD-10-CM | POA: Insufficient documentation

## 2019-01-30 DIAGNOSIS — E1122 Type 2 diabetes mellitus with diabetic chronic kidney disease: Secondary | ICD-10-CM | POA: Insufficient documentation

## 2019-01-30 DIAGNOSIS — I130 Hypertensive heart and chronic kidney disease with heart failure and stage 1 through stage 4 chronic kidney disease, or unspecified chronic kidney disease: Secondary | ICD-10-CM | POA: Diagnosis not present

## 2019-01-30 DIAGNOSIS — Z79899 Other long term (current) drug therapy: Secondary | ICD-10-CM | POA: Insufficient documentation

## 2019-01-30 DIAGNOSIS — R531 Weakness: Secondary | ICD-10-CM | POA: Insufficient documentation

## 2019-01-30 DIAGNOSIS — M791 Myalgia, unspecified site: Secondary | ICD-10-CM | POA: Diagnosis not present

## 2019-01-30 DIAGNOSIS — Z8249 Family history of ischemic heart disease and other diseases of the circulatory system: Secondary | ICD-10-CM | POA: Insufficient documentation

## 2019-01-30 DIAGNOSIS — Z8673 Personal history of transient ischemic attack (TIA), and cerebral infarction without residual deficits: Secondary | ICD-10-CM | POA: Diagnosis not present

## 2019-01-30 DIAGNOSIS — F17210 Nicotine dependence, cigarettes, uncomplicated: Secondary | ICD-10-CM | POA: Diagnosis not present

## 2019-01-30 DIAGNOSIS — Z833 Family history of diabetes mellitus: Secondary | ICD-10-CM | POA: Diagnosis not present

## 2019-01-30 DIAGNOSIS — E118 Type 2 diabetes mellitus with unspecified complications: Secondary | ICD-10-CM

## 2019-01-30 DIAGNOSIS — N182 Chronic kidney disease, stage 2 (mild): Secondary | ICD-10-CM | POA: Diagnosis not present

## 2019-01-30 DIAGNOSIS — I10 Essential (primary) hypertension: Secondary | ICD-10-CM

## 2019-01-30 DIAGNOSIS — I5022 Chronic systolic (congestive) heart failure: Secondary | ICD-10-CM

## 2019-01-30 LAB — BASIC METABOLIC PANEL
Anion gap: 6 (ref 5–15)
BUN: 31 mg/dL — ABNORMAL HIGH (ref 8–23)
CO2: 38 mmol/L — ABNORMAL HIGH (ref 22–32)
Calcium: 8.9 mg/dL (ref 8.9–10.3)
Chloride: 94 mmol/L — ABNORMAL LOW (ref 98–111)
Creatinine, Ser: 2.06 mg/dL — ABNORMAL HIGH (ref 0.61–1.24)
GFR calc Af Amer: 39 mL/min — ABNORMAL LOW (ref >60)
GFR calc non Af Amer: 34 mL/min — ABNORMAL LOW (ref >60)
Glucose, Bld: 263 mg/dL — ABNORMAL HIGH (ref 70–99)
Potassium: 4 mmol/L (ref 3.5–5.1)
Sodium: 138 mmol/L (ref 135–145)

## 2019-01-30 NOTE — Patient Instructions (Signed)
Continue weighing daily and call for an overnight weight gain of > 2 pounds or a weekly weight gain of >5 pounds. 

## 2019-01-31 ENCOUNTER — Telehealth: Payer: Self-pay | Admitting: Family

## 2019-01-31 MED ORDER — TORSEMIDE 20 MG PO TABS: 20.0000 mg | ORAL_TABLET | Freq: Two times a day (BID) | ORAL | Status: DC

## 2019-01-31 NOTE — Telephone Encounter (Signed)
Spoke with patient's wife regarding BMP results obtained from yesterday. Renal function has worsened some since hospital discharge. Creatinine is 2.06 with GFR 39. Has nephrology appointment scheduled in ~ 2 weeks.   Advised his wife to NOT start entresto back until he sees nephrology. Will decrease his torsemide to 20mg  BID (was taking 40mg  BID) as he has no edema and has lost >20 pounds.   Emphasized that he needed to weigh daily and let us know if he gains >2 pounds overnight or >5 pounds in one week. Patient's wife verbalized understanding.

## 2019-02-07 ENCOUNTER — Ambulatory Visit: Payer: No Typology Code available for payment source | Attending: Internal Medicine

## 2019-02-07 DIAGNOSIS — Z20822 Contact with and (suspected) exposure to covid-19: Secondary | ICD-10-CM

## 2019-02-08 DIAGNOSIS — Z794 Long term (current) use of insulin: Secondary | ICD-10-CM | POA: Diagnosis not present

## 2019-02-08 DIAGNOSIS — I10 Essential (primary) hypertension: Secondary | ICD-10-CM | POA: Diagnosis not present

## 2019-02-08 DIAGNOSIS — E782 Mixed hyperlipidemia: Secondary | ICD-10-CM | POA: Diagnosis not present

## 2019-02-08 DIAGNOSIS — E1165 Type 2 diabetes mellitus with hyperglycemia: Secondary | ICD-10-CM | POA: Diagnosis not present

## 2019-02-09 LAB — NOVEL CORONAVIRUS, NAA: SARS-CoV-2, NAA: NOT DETECTED

## 2019-03-05 NOTE — Progress Notes (Signed)
Patient ID: Shawn [name], male    DOB: [birth year], 64 y.o.   MRN: [id number]  HPI  Shawn [name] is a 64 y/o male with a history of DM, HTN, CKD, stroke, current tobacco use and chronic heart failure.   Echo report from 05/11/2018 reviewed and showed an EF of 25-30% along with mild/moderate Shawn.   Admitted 01/18/19 due to acute on chronic HF. Cardiology and nephrology consults obtained. Initially given IV lasix gtt and then transitioned to oral diuretics. Shawn [name] was stopped due to renal function. Discharged after 2 days.   He presents today for a follow-up visit with a chief complaint of minimal fatigue upon moderate exertion. He describes this as chronic in nature having been present for several years. He has associated worsening abdominal distention, constipation, leg weakness and weight gain along with this. He denies any difficulty sleeping, dizziness, palpitations, pedal edema, chest pain, shortness of breath or cough.   Past Medical History:  Diagnosis Date  . Chronic combined systolic (congestive) and diastolic (congestive) heart failure ([place])    a. TTE 6/15: EF < 20%, mildly dilated LV, DD, mildly dilated LA, mod dilated RA, mild Shawn, mild to mod TR, mod increased posterior wall thickness, elevated LA and LVEDP; b. 4/12019 Echo: EF 20-25%, diff HK. Gr1 DD, nl RV fxn.  . CKD (chronic kidney disease), stage II   . Diabetes mellitus with complication ([place])    a. Prior admissions w/ DKA (last 12/2017).  . Essential hypertension   . NICM (nonischemic cardiomyopathy) ([place])    a. 06/2013 Echo: EF<20%; b. 06/2013 Cath: no signif dzs; c. 04/2017 Echo: 20-25%, gr1 DD.; d.05/2018 Echo: 25-30%  . Polysubstance abuse ([place])    a. etoh and tobacco  . Stroke [facility])    Past Surgical History:  Procedure Laterality Date  . NO PAST SURGERIES     Family History  Problem Relation Age of Onset  . Congestive Heart Failure Mother   . Diabetes Mother   . Congestive Heart Failure Brother    Social  History   Tobacco Use  . Smoking status: Current Every Day Smoker    Packs/day: 0.50    Types: Cigarettes  . Smokeless tobacco: Never Used  Substance Use Topics  . Alcohol use: Not Currently   No Known Allergies  Prior to Admission medications   Medication Sig Start Date End Date Taking? Authorizing Provider  aspirin 81 MG chewable tablet Chew 1 tablet (81 mg total) by mouth daily. 04/30/17  Yes Demetrios Loll, MD  atorvastatin (LIPITOR) 40 MG tablet Take 0.5 tablets (20 mg total) by mouth at bedtime. 09/26/18  Yes Minna Merritts, MD  B-Complex-C TABS Take 1 tablet by mouth daily.   Yes [provider]  baclofen (LIORESAL) 10 MG tablet Take 10 mg by mouth 2 (two) times daily as needed for muscle spasms. Muscle spasm   Yes [provider]  carvedilol (COREG) 25 MG tablet Take 1 tablet (25 mg total) by mouth 2 (two) times daily with a meal. 01/20/19  Yes Danford, Suann Larry, MD  cetirizine (ZYRTEC) 10 MG tablet Take 10 mg by mouth daily.   Yes [provider]  hydrALAZINE (APRESOLINE) 100 MG tablet Take 100 mg by mouth 3 (three) times daily.    Yes [provider]  insulin aspart (NOVOLOG) 100 UNIT/ML injection Inject 5 Units into the skin 3 (three) times daily with meals.   Yes [provider]  insulin glargine (LANTUS) 100 UNIT/ML injection Inject 15  Units into the skin at bedtime.   Yes [provider]  omeprazole (PRILOSEC) 20 MG capsule Take 20 mg by mouth daily. 12/18/18  Yes [provider]  potassium chloride SA (K-DUR) 20 MEQ tablet Take 1 tablet (20 mEq total) by mouth daily. 09/26/18  Yes Minna Merritts, MD  spironolactone (ALDACTONE) 25 MG tablet Take 25 mg by mouth at bedtime.   Yes [provider]  torsemide (DEMADEX) 20 MG tablet Take 1 tablet (20 mg total) by mouth 2 (two) times daily. 01/31/19  Yes Alisa Graff, FNP    Review of Systems  Constitutional: Positive for fatigue. Negative for appetite  change.  HENT: Negative for congestion, postnasal drip and sore throat.   Eyes: Negative.   Respiratory: Negative for cough, chest tightness and shortness of breath.   Cardiovascular: Negative for chest pain, palpitations and leg swelling.  Gastrointestinal: Positive for abdominal distention (worsening) and constipation. Negative for abdominal pain.  Endocrine: Negative.   Genitourinary: Negative.   Musculoskeletal: Positive for myalgias (right thigh sore). Negative for back pain and neck pain.  Skin: Negative.   Allergic/Immunologic: Negative.   Neurological: Positive for weakness (legs). Negative for dizziness and light-headedness.  Hematological: Negative for adenopathy. Does not bruise/bleed easily.  Psychiatric/Behavioral: Negative for dysphoric mood and sleep disturbance (sleeping on 2 pillows). The patient is not nervous/anxious.    Vitals:   03/06/19 1246  BP: 119/77  Pulse: 86  Resp: 16  SpO2: 99%  Weight: 155 lb (70.3 kg)  Height: 5\' 11"  (1.803 m)   Wt Readings from Last 3 Encounters:  03/06/19 155 lb (70.3 kg)  01/30/19 148 lb 6.4 oz (67.3 kg)  01/20/19 160 lb 4.8 oz (72.7 kg)   Lab Results  Component Value Date   CREATININE 2.06 (H) 01/30/2019   CREATININE 1.75 (H) 01/20/2019   CREATININE 2.31 (H) 01/19/2019    Physical Exam Vitals and nursing note reviewed.  Constitutional:      Appearance: Normal appearance.  HENT:     Head: Normocephalic and atraumatic.  Cardiovascular:     Rate and Rhythm: Normal rate and regular rhythm.  Pulmonary:     Effort: Pulmonary effort is normal.     Breath sounds: Normal breath sounds. No wheezing or rales.  Abdominal:     General: There is distension.     Tenderness: There is no abdominal tenderness.     Comments: Firm  Musculoskeletal:        General: No tenderness.     Cervical back: Normal range of motion and neck supple.     Right lower leg: No edema.     Left lower leg: No edema.  Skin:    General: Skin is warm  and dry.  Neurological:     General: No focal deficit present.     Mental Status: He is alert and oriented to person, place, and time.  Psychiatric:        Mood and Affect: Mood normal.        Behavior: Behavior normal.        Thought Content: Thought content normal.     Assessment & Plan:  1: Acute on Chronic heart failure with reduced ejection fraction- - NYHA class II - euvolemic today - weighing daily and says that his weight has gradually risen. Reminded to call for an overnight weight gain of >2 pounds or a weekly weight gain of >5 pounds - weight up 7 pounds from last visit here 1 month ago -  will send for 80mg  IV lasix/ 19meq PO potassium but patient can't do it until tomorrow morning - will get BMP/ BNP tomorrow as well - not adding salt and his wife is reading food labels closely for sodium content - entresto has been resumed by nephrology - consider adding farxiga - saw cardiology Mickle Plumb ) 01/18/19 - BNP 01/18/19 was 283.0 - received his flu vaccine for this season  2: HTN- - BP looks good today - saw PCP at the [place] a couple of weeks ago - BMP 02/12/19 reviewed and showed sodium 137, potassium 4.0, creatinine 1.39 and GFR 63  3: DM- - saw endocrinology Pasty Arch) 02/08/19 - A1c 01/18/19 was 11.0% - home glucose this morning was 104 - saw nephrology([place]) 02/12/19 & returns 03/13/19  4: Tobacco use- - smoking 1/2 ppd of cigarettes - cessation discussed for 3 minutes with him  5: Constipation- - will try daily miralax to see if this helps - follow-up with PCP if this doesn't help  Patient did not bring his medications nor a list. Each medication was verbally reviewed with the patient and he was encouraged to bring the bottles to every visit to confirm accuracy of list.  Medication bottles were reviewed.

## 2019-03-06 ENCOUNTER — Ambulatory Visit: Payer: Medicaid Other | Attending: Family | Admitting: Family

## 2019-03-06 ENCOUNTER — Other Ambulatory Visit: Payer: Self-pay

## 2019-03-06 ENCOUNTER — Other Ambulatory Visit: Payer: Self-pay | Admitting: Family

## 2019-03-06 ENCOUNTER — Encounter: Payer: Self-pay | Admitting: Family

## 2019-03-06 VITALS — BP 119/77 | HR 86 | Resp 16 | Ht 71.0 in | Wt 155.0 lb

## 2019-03-06 DIAGNOSIS — R531 Weakness: Secondary | ICD-10-CM | POA: Insufficient documentation

## 2019-03-06 DIAGNOSIS — E118 Type 2 diabetes mellitus with unspecified complications: Secondary | ICD-10-CM

## 2019-03-06 DIAGNOSIS — I10 Essential (primary) hypertension: Secondary | ICD-10-CM

## 2019-03-06 DIAGNOSIS — F17210 Nicotine dependence, cigarettes, uncomplicated: Secondary | ICD-10-CM | POA: Insufficient documentation

## 2019-03-06 DIAGNOSIS — I130 Hypertensive heart and chronic kidney disease with heart failure and stage 1 through stage 4 chronic kidney disease, or unspecified chronic kidney disease: Secondary | ICD-10-CM | POA: Insufficient documentation

## 2019-03-06 DIAGNOSIS — Z8249 Family history of ischemic heart disease and other diseases of the circulatory system: Secondary | ICD-10-CM | POA: Insufficient documentation

## 2019-03-06 DIAGNOSIS — I5023 Acute on chronic systolic (congestive) heart failure: Secondary | ICD-10-CM

## 2019-03-06 DIAGNOSIS — R5383 Other fatigue: Secondary | ICD-10-CM | POA: Insufficient documentation

## 2019-03-06 DIAGNOSIS — Z8673 Personal history of transient ischemic attack (TIA), and cerebral infarction without residual deficits: Secondary | ICD-10-CM | POA: Insufficient documentation

## 2019-03-06 DIAGNOSIS — Z720 Tobacco use: Secondary | ICD-10-CM

## 2019-03-06 DIAGNOSIS — R140 Abdominal distension (gaseous): Secondary | ICD-10-CM | POA: Insufficient documentation

## 2019-03-06 DIAGNOSIS — Z79899 Other long term (current) drug therapy: Secondary | ICD-10-CM | POA: Diagnosis not present

## 2019-03-06 DIAGNOSIS — K5900 Constipation, unspecified: Secondary | ICD-10-CM | POA: Diagnosis not present

## 2019-03-06 DIAGNOSIS — Z7982 Long term (current) use of aspirin: Secondary | ICD-10-CM | POA: Insufficient documentation

## 2019-03-06 DIAGNOSIS — E1122 Type 2 diabetes mellitus with diabetic chronic kidney disease: Secondary | ICD-10-CM | POA: Diagnosis not present

## 2019-03-06 DIAGNOSIS — Z794 Long term (current) use of insulin: Secondary | ICD-10-CM | POA: Diagnosis not present

## 2019-03-06 DIAGNOSIS — N182 Chronic kidney disease, stage 2 (mild): Secondary | ICD-10-CM | POA: Insufficient documentation

## 2019-03-06 MED ORDER — POLYETHYLENE GLYCOL 3350 17 G PO PACK: 17.0000 g | PACK | Freq: Every day | ORAL | 0 refills | Status: DC

## 2019-03-06 NOTE — Patient Instructions (Signed)
Continue weighing daily and call for an overnight weight gain of > 2 pounds or a weekly weight gain of >5 pounds. 

## 2019-03-07 ENCOUNTER — Ambulatory Visit
Admission: RE | Admit: 2019-03-07 | Discharge: 2019-03-07 | Disposition: A | Discharge status: Home / Self Care | Payer: Medicaid Other | Source: Ambulatory Visit | Attending: Family | Admitting: Family

## 2019-03-07 ENCOUNTER — Other Ambulatory Visit: Payer: Self-pay

## 2019-03-07 ENCOUNTER — Telehealth: Payer: Self-pay | Admitting: Family

## 2019-03-07 DIAGNOSIS — I5023 Acute on chronic systolic (congestive) heart failure: Secondary | ICD-10-CM | POA: Insufficient documentation

## 2019-03-07 LAB — BASIC METABOLIC PANEL
Anion gap: 16 — ABNORMAL HIGH (ref 5–15)
BUN: 47 mg/dL — ABNORMAL HIGH (ref 8–23)
CO2: 24 mmol/L (ref 22–32)
Calcium: 8.8 mg/dL — ABNORMAL LOW (ref 8.9–10.3)
Chloride: 91 mmol/L — ABNORMAL LOW (ref 98–111)
Creatinine, Ser: 1.74 mg/dL — ABNORMAL HIGH (ref 0.61–1.24)
GFR calc Af Amer: 48 mL/min — ABNORMAL LOW (ref >60)
GFR calc non Af Amer: 41 mL/min — ABNORMAL LOW (ref >60)
Glucose, Bld: 666 mg/dL (ref 70–99)
Potassium: 4 mmol/L (ref 3.5–5.1)
Sodium: 131 mmol/L — ABNORMAL LOW (ref 135–145)

## 2019-03-07 LAB — BRAIN NATRIURETIC PEPTIDE: B Natriuretic Peptide: 394 pg/mL — ABNORMAL HIGH (ref 0.0–100.0)

## 2019-03-07 MED ORDER — POTASSIUM CHLORIDE CRYS ER 20 MEQ PO TBCR
EXTENDED_RELEASE_TABLET | ORAL | Status: AC
  Administered 2019-03-07: 40 meq via ORAL
  Filled 2019-03-07: qty 2

## 2019-03-07 MED ORDER — FUROSEMIDE 10 MG/ML IJ SOLN
INTRAMUSCULAR | Status: AC
  Administered 2019-03-07: 80 mg via INTRAVENOUS
  Filled 2019-03-07: qty 8

## 2019-03-07 MED ORDER — POTASSIUM CHLORIDE CRYS ER 20 MEQ PO TBCR: 40.0000 meq | EXTENDED_RELEASE_TABLET | Freq: Once | ORAL | Status: AC

## 2019-03-07 MED ORDER — FUROSEMIDE 10 MG/ML IJ SOLN: 80.0000 mg | Freq: Once | INTRAMUSCULAR | Status: AC

## 2019-03-07 NOTE — OR Nursing (Signed)
This RN spoke with Darylene Price, NP at HF clinic to make aware of glucose 666 at this time. Per Otila Kluver pt is to receive lasix and K po and go home and recheck, medicate and make PCP aware.   Per pt he did not take lantus or novolog last night. PT had watermelon and pancakes this am. PT took 25 U novolog this am. Otila Kluver made aware

## 2019-03-07 NOTE — Telephone Encounter (Signed)
Patient at same day surgery to get 80mg  IV lasix/ 32meq potassium. [place] called with critical glucose of 666. Patient told her that last night, his glucose was in the 70's so he didn't take any of his lantus or his evening novolog.   This morning, his wife said his glucose at home was in the 250's and he took 25 units of novolog. No complaints related to his hyperglycemia.   Advised [place] to go ahead and given him the IV lasix/ oral potassium and tell him to monitor his glucose carefully at home. Should it not start to decline in a few hours, he needs to call his PCP or endocrinologist.

## 2019-03-07 NOTE — OR Nursing (Signed)
Pt informed and encouraged to be seen in ED for hyperglycemia,. Pt and spouse refuse. PT is A&OX4, appears to be in no distress. Per spouse pt will be checking glucose at home regularly throughout the day and will notify PCP. Pt and spouse understand when needed to go to ER if glucose is continuing to rise and pt becomes symptomatic.   This RN attempted contact with PCP ([facility], Dr. Spero Curb) and left message with secretary of pt noted glucose.

## 2019-03-08 ENCOUNTER — Telehealth: Payer: Self-pay | Admitting: Family

## 2019-03-08 NOTE — Telephone Encounter (Signed)
Called patient yesterday @ 2:00pm and he said his glucose was down to 389.   Called patient this morning and he said that his glucose was in the 150's this morning. Advised patient to continue to monitor his glucose levels and follow up with PCP or endocrinologist.

## 2019-03-15 DIAGNOSIS — E782 Mixed hyperlipidemia: Secondary | ICD-10-CM | POA: Diagnosis not present

## 2019-03-15 DIAGNOSIS — Z720 Tobacco use: Secondary | ICD-10-CM | POA: Diagnosis not present

## 2019-03-15 DIAGNOSIS — I10 Essential (primary) hypertension: Secondary | ICD-10-CM | POA: Diagnosis not present

## 2019-03-15 DIAGNOSIS — E1165 Type 2 diabetes mellitus with hyperglycemia: Secondary | ICD-10-CM | POA: Diagnosis not present

## 2019-03-15 DIAGNOSIS — Z794 Long term (current) use of insulin: Secondary | ICD-10-CM | POA: Diagnosis not present

## 2019-03-20 ENCOUNTER — Ambulatory Visit: Payer: No Typology Code available for payment source | Admitting: Family

## 2019-03-20 NOTE — Progress Notes (Signed)
Patient ID: Shawn [name], male    DOB: Mar 07, 1955, 64 y.o.   MRN: [id number]  HPI  Mr Knoble is a 64 y/o male with a history of DM, HTN, CKD, stroke, current tobacco use and chronic heart failure.   Echo report from 05/11/2018 reviewed and showed an EF of 25-30% along with mild/moderate MR.   Admitted 01/18/19 due to acute on chronic HF. Cardiology and nephrology consults obtained. Initially given IV lasix gtt and then transitioned to oral diuretics. [name] was stopped due to renal function. Discharged after 2 days.   He presents today for a follow-up visit with a chief complaint of minimal fatigue upon moderate exertion. He describes this as chronic in nature having been present for several years. He has associated leg weakness, abdominal distention, constipation and slight weight gain along with this. He denies any difficulty sleeping, dizziness, palpitations, pedal edema, chest pain, shortness of breath or cough.   Received 80mg  IV lasix/ 77meq PO potassium since he was here last.  Has received his 1st COVID vaccine.   Past Medical History:  Diagnosis Date  . Chronic combined systolic (congestive) and diastolic (congestive) heart failure ([place])    a. TTE 6/15: EF < 20%, mildly dilated LV, DD, mildly dilated LA, mod dilated RA, mild MR, mild to mod TR, mod increased posterior wall thickness, elevated LA and LVEDP; b. 4/12019 Echo: EF 20-25%, diff HK. Gr1 DD, nl RV fxn.  . CKD (chronic kidney disease), stage II   . Diabetes mellitus with complication ([place])    a. Prior admissions w/ DKA (last 12/2017).  . Essential hypertension   . NICM (nonischemic cardiomyopathy) ([place])    a. 06/2013 Echo: EF<20%; b. 06/2013 Cath: no signif dzs; c. 04/2017 Echo: 20-25%, gr1 DD.; d.05/2018 Echo: 25-30%  . Polysubstance abuse ([place])    a. etoh and tobacco  . Stroke [facility])    Past Surgical History:  Procedure Laterality Date  . NO PAST SURGERIES     Family History  Problem Relation Age of Onset   . Congestive Heart Failure Mother   . Diabetes Mother   . Congestive Heart Failure Brother    Social History   Tobacco Use  . Smoking status: Current Every Day Smoker    Packs/day: 0.50    Types: Cigarettes  . Smokeless tobacco: Never Used  Substance Use Topics  . Alcohol use: Not Currently   No Known Allergies  Prior to Admission medications   Medication Sig Start Date End Date Taking? Authorizing Provider  aspirin 81 MG chewable tablet Chew 1 tablet (81 mg total) by mouth daily. 04/30/17  Yes Demetrios Loll, MD  atorvastatin (LIPITOR) 40 MG tablet Take 0.5 tablets (20 mg total) by mouth at bedtime. 09/26/18  Yes Minna Merritts, MD  B-Complex-C TABS Take 1 tablet by mouth daily.   Yes [provider]  baclofen (LIORESAL) 10 MG tablet Take 10 mg by mouth 2 (two) times daily as needed for muscle spasms. Muscle spasm   Yes [provider]  carvedilol (COREG) 25 MG tablet Take 1 tablet (25 mg total) by mouth 2 (two) times daily with a meal. 01/20/19  Yes Danford, Suann Larry, MD  cetirizine (ZYRTEC) 10 MG tablet Take 10 mg by mouth daily.   Yes [provider]  hydrALAZINE (APRESOLINE) 100 MG tablet Take 100 mg by mouth 3 (three) times daily.    Yes [provider]  insulin aspart (NOVOLOG) 100 UNIT/ML injection Inject 5 Units into the skin  3 (three) times daily with meals.   Yes [provider]  insulin glargine (LANTUS) 100 UNIT/ML injection Inject 15 Units into the skin at bedtime.   Yes [provider]  omeprazole (PRILOSEC) 20 MG capsule Take 20 mg by mouth daily. 12/18/18  Yes [provider]  polyethylene glycol (MIRALAX / GLYCOLAX) 17 g packet Take 17 g by mouth daily. 03/06/19  Yes Dajanee Voorheis, Otila Kluver A, FNP  potassium chloride SA (K-DUR) 20 MEQ tablet Take 1 tablet (20 mEq total) by mouth daily. 09/26/18  Yes Gollan, Kathlene November, MD  sacubitril-valsartan (ENTRESTO) 97-103 MG Take 1 tablet by mouth 2 (two) times daily.   Yes  [provider]  spironolactone (ALDACTONE) 25 MG tablet Take 25 mg by mouth at bedtime.   Yes [provider]  torsemide (DEMADEX) 20 MG tablet Take 1 tablet (20 mg total) by mouth 2 (two) times daily. 01/31/19  Yes Alisa Graff, FNP     Review of Systems  Constitutional: Positive for fatigue. Negative for appetite change.  HENT: Negative for congestion, postnasal drip and sore throat.   Eyes: Negative.   Respiratory: Negative for cough, chest tightness and shortness of breath.   Cardiovascular: Negative for chest pain, palpitations and leg swelling.  Gastrointestinal: Positive for abdominal distention (better) and constipation. Negative for abdominal pain.  Endocrine: Negative.   Genitourinary: Negative.   Musculoskeletal: Negative for back pain, myalgias and neck pain.  Skin: Negative.   Allergic/Immunologic: Negative.   Neurological: Positive for weakness (legs). Negative for dizziness and light-headedness.  Hematological: Negative for adenopathy. Does not bruise/bleed easily.  Psychiatric/Behavioral: Negative for dysphoric mood and sleep disturbance (sleeping on 2 pillows). The patient is not nervous/anxious.    Vitals:   03/21/19 0938  BP: 90/73  Pulse: 73  Resp: 18  SpO2: 99%  Weight: 153 lb 6 oz (69.6 kg)  Height: 5\' 11"  (1.803 m)   Wt Readings from Last 3 Encounters:  03/21/19 153 lb 6 oz (69.6 kg)  03/06/19 155 lb (70.3 kg)  01/30/19 148 lb 6.4 oz (67.3 kg)   Lab Results  Component Value Date   CREATININE 1.74 (H) 03/07/2019   CREATININE 2.06 (H) 01/30/2019   CREATININE 1.75 (H) 01/20/2019    Physical Exam Vitals and nursing note reviewed.  Constitutional:      Appearance: Normal appearance.  HENT:     Head: Normocephalic and atraumatic.  Cardiovascular:     Rate and Rhythm: Normal rate and regular rhythm.  Pulmonary:     Effort: Pulmonary effort is normal.     Breath sounds: Normal breath sounds. No wheezing or rales.  Abdominal:      General: There is distension.     Tenderness: There is no abdominal tenderness.  Musculoskeletal:        General: No tenderness.     Cervical back: Normal range of motion and neck supple.     Right lower leg: No edema.     Left lower leg: No edema.  Skin:    General: Skin is warm and dry.  Neurological:     General: No focal deficit present.     Mental Status: He is alert and oriented to person, place, and time.  Psychiatric:        Mood and Affect: Mood normal.        Behavior: Behavior normal.        Thought Content: Thought content normal.     Assessment & Plan:  1: Chronic heart failure  with reduced ejection fraction- - NYHA class II - euvolemic today - weighing daily and says that his weight has gradually risen. Reminded to call for an overnight weight gain of >2 pounds or a weekly weight gain of >5 pounds - weight down 1.4 pounds from last visit here 2 weeks ago - received 80mg  IV lasix/ 22meq PO potassium 2 weeks ago - will add farxiga 10mg  daily; 30 day coupon given to patient  - will check BMP at his next visit - not adding salt and his wife is reading food labels closely for sodium content - saw cardiology Mickle Plumb ) 01/18/19 - BNP 03/07/19 was 394.0 - received his flu vaccine for this season  2: HTN- - BP on the low side today but he is without any dizziness - saw PCP at the [place] about a month ago - BMP 03/07/19 reviewed and showed sodium 131, potassium 4.0, creatinine 1.74 and GFR 48  3: DM- - saw endocrinology Pasty Arch) 03/15/19 - A1c 01/18/19 was 11.0% - home glucose yesterday morning was 120 - saw nephrology(Kolluru) 03/13/19  4: Tobacco use- - smoking 1/2 ppd of cigarettes - cessation discussed for 3 minutes with him    Patient did not bring his medications nor a list. Each medication was verbally reviewed with the patient and he was encouraged to bring the bottles to every visit to confirm accuracy of list.  Return in 2 weeks or sooner for any  questions/problems before then.

## 2019-03-21 ENCOUNTER — Ambulatory Visit: Payer: Medicaid Other | Attending: Family | Admitting: Family

## 2019-03-21 ENCOUNTER — Encounter: Payer: Self-pay | Admitting: Family

## 2019-03-21 ENCOUNTER — Other Ambulatory Visit: Payer: Self-pay

## 2019-03-21 VITALS — BP 90/73 | HR 73 | Resp 18 | Ht 71.0 in | Wt 153.4 lb

## 2019-03-21 DIAGNOSIS — I10 Essential (primary) hypertension: Secondary | ICD-10-CM

## 2019-03-21 DIAGNOSIS — N182 Chronic kidney disease, stage 2 (mild): Secondary | ICD-10-CM | POA: Insufficient documentation

## 2019-03-21 DIAGNOSIS — Z794 Long term (current) use of insulin: Secondary | ICD-10-CM | POA: Diagnosis not present

## 2019-03-21 DIAGNOSIS — Z8673 Personal history of transient ischemic attack (TIA), and cerebral infarction without residual deficits: Secondary | ICD-10-CM | POA: Insufficient documentation

## 2019-03-21 DIAGNOSIS — I5042 Chronic combined systolic (congestive) and diastolic (congestive) heart failure: Secondary | ICD-10-CM | POA: Diagnosis present

## 2019-03-21 DIAGNOSIS — Z720 Tobacco use: Secondary | ICD-10-CM

## 2019-03-21 DIAGNOSIS — E1122 Type 2 diabetes mellitus with diabetic chronic kidney disease: Secondary | ICD-10-CM | POA: Diagnosis not present

## 2019-03-21 DIAGNOSIS — I130 Hypertensive heart and chronic kidney disease with heart failure and stage 1 through stage 4 chronic kidney disease, or unspecified chronic kidney disease: Secondary | ICD-10-CM | POA: Diagnosis not present

## 2019-03-21 DIAGNOSIS — Z7982 Long term (current) use of aspirin: Secondary | ICD-10-CM | POA: Diagnosis not present

## 2019-03-21 DIAGNOSIS — I5022 Chronic systolic (congestive) heart failure: Secondary | ICD-10-CM

## 2019-03-21 DIAGNOSIS — Z8249 Family history of ischemic heart disease and other diseases of the circulatory system: Secondary | ICD-10-CM | POA: Insufficient documentation

## 2019-03-21 DIAGNOSIS — F17210 Nicotine dependence, cigarettes, uncomplicated: Secondary | ICD-10-CM | POA: Diagnosis not present

## 2019-03-21 DIAGNOSIS — E118 Type 2 diabetes mellitus with unspecified complications: Secondary | ICD-10-CM

## 2019-03-21 DIAGNOSIS — Z79899 Other long term (current) drug therapy: Secondary | ICD-10-CM | POA: Diagnosis not present

## 2019-03-21 MED ORDER — FARXIGA 10 MG PO TABS: 10.0000 mg | ORAL_TABLET | Freq: Every day | ORAL | 5 refills | Status: DC

## 2019-03-21 NOTE — Patient Instructions (Addendum)
Continue weighing daily and call for an overnight weight gain of > 2 pounds or a weekly weight gain of >5 pounds.  Begin taking farxiga once daily

## 2019-03-29 ENCOUNTER — Other Ambulatory Visit: Payer: Self-pay

## 2019-03-29 ENCOUNTER — Telehealth: Payer: Self-pay | Admitting: Cardiovascular Disease

## 2019-03-29 MED ORDER — SACUBITRIL-VALSARTAN 97-103 MG PO TABS: 1.0000 | ORAL_TABLET | Freq: Two times a day (BID) | ORAL | 3 refills | Status: DC

## 2019-03-29 NOTE — Telephone Encounter (Signed)
Can you please review. I can not tell if Darylene Price, NP or Dr. Rockey Situ put him on this medication.

## 2019-03-29 NOTE — Telephone Encounter (Signed)
*  STAT* If patient is at the pharmacy, call can be transferred to refill team.   1. Which medications need to be refilled? (please list name of each medication and dose if known)  Entresto 97-103 mg po BID  2. Which pharmacy/location (including street and city if local pharmacy) is medication to be sent to? [facility]   3. Do they need a 30 day or 90 day supply?  [place]

## 2019-03-29 NOTE — Telephone Encounter (Signed)
Dr. Rockey Situ prescribed this for him so we can send in refill.Thanks

## 2019-03-30 ENCOUNTER — Other Ambulatory Visit: Payer: Self-pay

## 2019-03-30 ENCOUNTER — Emergency Department
Admission: EM | Admit: 2019-03-30 | Discharge: 2019-03-30 | Disposition: A | Discharge status: Home / Self Care | Payer: No Typology Code available for payment source | Attending: Emergency Medicine | Admitting: Emergency Medicine

## 2019-03-30 DIAGNOSIS — F17210 Nicotine dependence, cigarettes, uncomplicated: Secondary | ICD-10-CM | POA: Diagnosis not present

## 2019-03-30 DIAGNOSIS — N1832 Chronic kidney disease, stage 3b: Secondary | ICD-10-CM | POA: Diagnosis not present

## 2019-03-30 DIAGNOSIS — E1122 Type 2 diabetes mellitus with diabetic chronic kidney disease: Secondary | ICD-10-CM | POA: Diagnosis not present

## 2019-03-30 DIAGNOSIS — R188 Other ascites: Secondary | ICD-10-CM | POA: Diagnosis not present

## 2019-03-30 DIAGNOSIS — I130 Hypertensive heart and chronic kidney disease with heart failure and stage 1 through stage 4 chronic kidney disease, or unspecified chronic kidney disease: Secondary | ICD-10-CM | POA: Insufficient documentation

## 2019-03-30 DIAGNOSIS — K310 Acute dilatation of stomach: Secondary | ICD-10-CM | POA: Diagnosis present

## 2019-03-30 DIAGNOSIS — I5042 Chronic combined systolic (congestive) and diastolic (congestive) heart failure: Secondary | ICD-10-CM | POA: Diagnosis not present

## 2019-03-30 LAB — COMPREHENSIVE METABOLIC PANEL
ALT: 6 U/L (ref 0–44)
AST: 16 U/L (ref 15–41)
Albumin: 3.3 g/dL — ABNORMAL LOW (ref 3.5–5.0)
Alkaline Phosphatase: 92 U/L (ref 38–126)
Anion gap: 11 (ref 5–15)
BUN: 36 mg/dL — ABNORMAL HIGH (ref 8–23)
CO2: 32 mmol/L (ref 22–32)
Calcium: 9 mg/dL (ref 8.9–10.3)
Chloride: 95 mmol/L — ABNORMAL LOW (ref 98–111)
Creatinine, Ser: 1.44 mg/dL — ABNORMAL HIGH (ref 0.61–1.24)
GFR calc Af Amer: 60 mL/min — ABNORMAL LOW (ref >60)
GFR calc non Af Amer: 52 mL/min — ABNORMAL LOW (ref >60)
Glucose, Bld: 152 mg/dL — ABNORMAL HIGH (ref 70–99)
Potassium: 4 mmol/L (ref 3.5–5.1)
Sodium: 138 mmol/L (ref 135–145)
Total Bilirubin: 0.5 mg/dL (ref 0.3–1.2)
Total Protein: 6.8 g/dL (ref 6.5–8.1)

## 2019-03-30 LAB — CBC
HCT: 36 % — ABNORMAL LOW (ref 39.0–52.0)
Hemoglobin: 11.3 g/dL — ABNORMAL LOW (ref 13.0–17.0)
MCH: 26 pg (ref 26.0–34.0)
MCHC: 31.4 g/dL (ref 30.0–36.0)
MCV: 82.8 fL (ref 80.0–100.0)
Platelets: 457 10*3/uL — ABNORMAL HIGH (ref 150–400)
RBC: 4.35 MIL/uL (ref 4.22–5.81)
RDW: 16.3 % — ABNORMAL HIGH (ref 11.5–15.5)
WBC: 5.2 10*3/uL (ref 4.0–10.5)
nRBC: 0 % (ref 0.0–0.2)

## 2019-03-30 LAB — LIPASE, BLOOD: Lipase: 21 U/L (ref 11–51)

## 2019-03-30 MED ORDER — SPIRONOLACTONE 50 MG PO TABS: 50.0000 mg | ORAL_TABLET | Freq: Every day | ORAL | 1 refills | Status: DC

## 2019-03-30 MED ORDER — SODIUM CHLORIDE 0.9% FLUSH: 3.0000 mL | Freq: Once | INTRAVENOUS | Status: DC

## 2019-03-30 NOTE — ED Provider Notes (Signed)
[facility] Emergency Department Provider Note       Time seen: ----------------------------------------- 1:52 PM on 03/30/2019 -----------------------------------------   I have reviewed the triage vital signs and the nursing notes.  HISTORY Chief Complaint Abdominal Distention    HPI Shawn [name] is a 64 y.o. male with a history of CHF, CKD, diabetes, hypertension, cardiomyopathy, substance abuse, CVA who presents to the ED for swelling for the past month.  His main complaint is abdominal distention.  He reports he is not currently taking Lasix but is taking his other diuretics.  Past Medical History:  Diagnosis Date  . Chronic combined systolic (congestive) and diastolic (congestive) heart failure ([place])    a. TTE 6/15: EF < 20%, mildly dilated LV, DD, mildly dilated LA, mod dilated RA, mild MR, mild to mod TR, mod increased posterior wall thickness, elevated LA and LVEDP; b. 4/12019 Echo: EF 20-25%, diff HK. Gr1 DD, nl RV fxn.  . CKD (chronic kidney disease), stage II   . Diabetes mellitus with complication ([place])    a. Prior admissions w/ DKA (last 12/2017).  . Essential hypertension   . NICM (nonischemic cardiomyopathy) ([place])    a. 06/2013 Echo: EF<20%; b. 06/2013 Cath: no signif dzs; c. 04/2017 Echo: 20-25%, gr1 DD.; d.05/2018 Echo: 25-30%  . Polysubstance abuse ([place])    a. etoh and tobacco  . Stroke [facility])     Patient Active Problem List   Diagnosis Date Noted  . Congestive heart failure with cardiomyopathy ([place]) 01/18/2019  . AKI (acute kidney injury) ([place])   . Fluid retention   . Nonketotic hyperglycinemia ([place]) 09/22/2018  . Nonketotic hypoglycemia 09/22/2018  . DKA, type 2 ([place]) 08/07/2018  . Essential hypertension 05/12/2018  . Chronic systolic CHF (congestive heart failure) ([place]) 12/15/2017  . NICM (nonischemic cardiomyopathy) ([place]) 05/13/2017  . Type 2 diabetes mellitus with stage 3b chronic kidney disease, with long-term current  use of insulin ([place]) 05/13/2017  . Smoker 05/13/2017  . Polysubstance abuse ([place]) 05/13/2017  . Alcohol abuse 05/13/2017  . Diabetic ketoacidosis without coma associated with type 2 diabetes mellitus ([place]) 04/27/2017    Past Surgical History:  Procedure Laterality Date  . NO PAST SURGERIES      Allergies Patient has no known allergies.  Social History Social History   Tobacco Use  . Smoking status: Current Every Day Smoker    Packs/day: 0.50    Types: Cigarettes  . Smokeless tobacco: Never Used  Substance Use Topics  . Alcohol use: Not Currently  . Drug use: Not on file    Review of Systems Constitutional: Negative for fever. Cardiovascular: Negative for chest pain. Respiratory: Negative for shortness of breath. Gastrointestinal: Positive for abdominal distention Musculoskeletal: Negative for back pain. Skin: Negative for rash. Neurological: Negative for headaches, focal weakness or numbness.  All systems negative/normal/unremarkable except as stated in the HPI  ____________________________________________   PHYSICAL EXAM:  VITAL SIGNS: ED Triage Vitals  Enc Vitals Group     BP 03/30/19 0912 (!) 93/51     Pulse Rate 03/30/19 0912 70     Resp 03/30/19 0912 15     Temp 03/30/19 0912 98.8 F (37.1 C)     Temp Source 03/30/19 0912 Oral     SpO2 03/30/19 0912 100 %     Weight 03/30/19 0925 135 lb (61.2 kg)     Height 03/30/19 0913 5\' 11"  (1.803 m)     Head Circumference --      Peak Flow --  Pain Score 03/30/19 0924 0     Pain Loc --      Pain Edu? --      Excl. in [place]? --     Constitutional: Alert and oriented. Well appearing and in no distress. Eyes: Conjunctivae are normal. Normal extraocular movements. Cardiovascular: Normal rate, regular rhythm. No murmurs, rubs, or gallops. Respiratory: Normal respiratory effort without tachypnea nor retractions. Breath sounds are clear and equal bilaterally. No wheezes/rales/rhonchi. Gastrointestinal: Abdominal  distention with ascites, nontender, normal bowel sounds Musculoskeletal: Nontender with normal range of motion in extremities. No lower extremity tenderness nor edema. Neurologic:  Normal speech and language. No gross focal neurologic deficits are appreciated.  Skin:  Skin is warm, dry and intact. No rash noted. Psychiatric: Mood and affect are normal. Speech and behavior are normal.  ____________________________________________  EKG: Interpreted by me.  Sinus rhythm with rate of 70 bpm, LVH with repolarization abnormality, normal QT  ____________________________________________  ED COURSE:  As part of my medical decision making, I reviewed the following data within the [place] History obtained from family if available, nursing notes, old chart and ekg, as well as notes from prior ED visits. Patient presented for ascites, we will assess with labs as indicated at this time.   Procedures  [name] was evaluated in Emergency Department on 03/30/2019 for the symptoms described in the history of present illness. He was evaluated in the context of the global COVID-19 pandemic, which necessitated consideration that the patient might be at risk for infection with the SARS-CoV-2 virus that causes COVID-19. Institutional protocols and algorithms that pertain to the evaluation of patients at risk for COVID-19 are in a state of rapid change based on information released by regulatory bodies including the CDC and federal and state organizations. These policies and algorithms were followed during the patient's care in the ED.  ____________________________________________   LABS (pertinent positives/negatives)  Labs Reviewed  COMPREHENSIVE METABOLIC PANEL - Abnormal; Notable for the following components:      Result Value   [place] 95 (*)    Glucose, Bld 152 (*)    BUN 36 (*)    Creatinine, Ser 1.44 (*)    Albumin 3.3 (*)    GFR calc non Af Amer 52 (*)    GFR calc Af  Amer 60 (*)    All other components within normal limits  CBC - Abnormal; Notable for the following components:   Hemoglobin 11.3 (*)    HCT 36.0 (*)    RDW 16.3 (*)    Platelets 457 (*)    All other components within normal limits  LIPASE, BLOOD  URINALYSIS, COMPLETE (UACMP) WITH MICROSCOPIC  BRAIN NATRIURETIC PEPTIDE   ____________________________________________   DIFFERENTIAL DIAGNOSIS   Ascites, CHF, electrolyte abnormality, renal failure  FINAL ASSESSMENT AND PLAN  Ascites   Plan: The patient had presented for abdominal distention which seems to be from ascites. Patient's labs were grossly unremarkable.  Patient is on spironolactone, I will increase this dose and he is scheduled for close outpatient follow-up with the heart failure clinic.   Laurence Aly, MD    Note: This note was generated in part or whole with voice recognition software. Voice recognition is usually quite accurate but there are transcription errors that can and very often do occur. I apologize for any typographical errors that were not detected and corrected.     Earleen Newport, MD 03/30/19 [phone]

## 2019-03-30 NOTE — ED Triage Notes (Signed)
Pt c/o abd distention with Bl LE swelling for the past month. States he has a hx of CHF.pt is in NAD. Respirations WNL

## 2019-03-30 NOTE — Discharge Instructions (Addendum)
You need to double your spironolactone dose that you take at night from 25 mg to 50 mg

## 2019-04-02 NOTE — Progress Notes (Signed)
Patient ID: Shawn [name], male    DOB: [birth year], 64 y.o.   MRN: [id number]  HPI  Mr Lahaie is a 64 y/o male with a history of DM, HTN, CKD, stroke, current tobacco use and chronic heart failure.   Echo report from 05/11/2018 reviewed and showed an EF of 25-30% along with mild/moderate MR.   Was in the ED 03/30/19 due to abdominal distention. Spironolactone increased due to ascites and he was released. Admitted 01/18/19 due to acute on chronic HF. Cardiology and nephrology consults obtained. Initially given IV lasix gtt and then transitioned to oral diuretics. [name] was stopped due to renal function. Discharged after 2 days.   He presents today for a follow-up visit with a chief complaint of minimal fatigue upon moderate exertion. He describes this as chronic in nature having been present for several years. He has associated leg weakness along with this. He denies any difficulty sleeping, abdominal distention, palpitations, pedal edema, chest pain, shortness of breath, cough, dizziness or weight gain.   Has recently had spironolactone increased to 50mg  daily after his ED visit and says that he feels "great" since his medication was increased. Has noticed that since his medication was increased that his BP has declined.   Has received his 1st COVID vaccine.   Past Medical History:  Diagnosis Date  . Chronic combined systolic (congestive) and diastolic (congestive) heart failure ([place])    a. TTE 6/15: EF < 20%, mildly dilated LV, DD, mildly dilated LA, mod dilated RA, mild MR, mild to mod TR, mod increased posterior wall thickness, elevated LA and LVEDP; b. 4/12019 Echo: EF 20-25%, diff HK. Gr1 DD, nl RV fxn.  . CKD (chronic kidney disease), stage II   . Diabetes mellitus with complication ([place])    a. Prior admissions w/ DKA (last 12/2017).  . Essential hypertension   . NICM (nonischemic cardiomyopathy) ([place])    a. 06/2013 Echo: EF<20%; b. 06/2013 Cath: no signif dzs; c. 04/2017 Echo:  20-25%, gr1 DD.; d.05/2018 Echo: 25-30%  . Polysubstance abuse ([place])    a. etoh and tobacco  . Stroke [facility])    Past Surgical History:  Procedure Laterality Date  . NO PAST SURGERIES     Family History  Problem Relation Age of Onset  . Congestive Heart Failure Mother   . Diabetes Mother   . Congestive Heart Failure Brother    Social History   Tobacco Use  . Smoking status: Current Every Day Smoker    Packs/day: 0.50    Types: Cigarettes  . Smokeless tobacco: Never Used  Substance Use Topics  . Alcohol use: Not Currently   No Known Allergies  Prior to Admission medications   Medication Sig Start Date End Date Taking? Authorizing Provider  aspirin 81 MG chewable tablet Chew 1 tablet (81 mg total) by mouth daily. 04/30/17  Yes Demetrios Loll, MD  atorvastatin (LIPITOR) 40 MG tablet Take 0.5 tablets (20 mg total) by mouth at bedtime. 09/26/18  Yes Minna Merritts, MD  B-Complex-C TABS Take 1 tablet by mouth daily.   Yes [provider]  baclofen (LIORESAL) 10 MG tablet Take 10 mg by mouth 2 (two) times daily as needed for muscle spasms. Muscle spasm   Yes [provider]  carvedilol (COREG) 25 MG tablet Take 1 tablet (25 mg total) by mouth 2 (two) times daily with a meal. 01/20/19  Yes Danford, Suann Larry, MD  cetirizine (ZYRTEC) 10 MG tablet Take 10 mg by mouth daily.  Yes [provider]  dapagliflozin propanediol (FARXIGA) 10 MG TABS tablet Take 10 mg by mouth daily before breakfast. 03/21/19  Yes Darylene Price A, FNP  hydrALAZINE (APRESOLINE) 100 MG tablet Take 100 mg by mouth 3 (three) times daily.    Yes [provider]  insulin aspart (NOVOLOG) 100 UNIT/ML injection Inject 5 Units into the skin 3 (three) times daily with meals.   Yes [provider]  insulin glargine (LANTUS) 100 UNIT/ML injection Inject 15 Units into the skin at bedtime.   Yes [provider]  omeprazole (PRILOSEC) 20 MG capsule Take 20 mg by mouth daily.  12/18/18  Yes [provider]  polyethylene glycol (MIRALAX / GLYCOLAX) 17 g packet Take 17 g by mouth daily. 03/06/19  Yes Janine Reller, Otila Kluver A, FNP  potassium chloride SA (K-DUR) 20 MEQ tablet Take 1 tablet (20 mEq total) by mouth daily. 09/26/18  Yes Gollan, Kathlene November, MD  sacubitril-valsartan (ENTRESTO) 97-103 MG Take 1 tablet by mouth 2 (two) times daily. 03/29/19  Yes Minna Merritts, MD  spironolactone (ALDACTONE) 50 MG tablet Take 1 tablet (50 mg total) by mouth at bedtime. 03/30/19  Yes Earleen Newport, MD  torsemide (DEMADEX) 20 MG tablet Take 1 tablet (20 mg total) by mouth 2 (two) times daily. 01/31/19  Yes Alisa Graff, FNP    Review of Systems  Constitutional: Positive for fatigue. Negative for appetite change.  HENT: Positive for sneezing. Negative for congestion, postnasal drip and sore throat.   Eyes: Negative.   Respiratory: Negative for cough, chest tightness and shortness of breath.   Cardiovascular: Negative for chest pain, palpitations and leg swelling.  Gastrointestinal: Positive for constipation. Negative for abdominal distention and abdominal pain.  Endocrine: Negative.   Genitourinary: Negative.   Musculoskeletal: Negative for back pain, myalgias and neck pain.  Skin: Negative.   Allergic/Immunologic: Negative.   Neurological: Positive for weakness (legs). Negative for dizziness and light-headedness.  Hematological: Negative for adenopathy. Does not bruise/bleed easily.  Psychiatric/Behavioral: Negative for dysphoric mood and sleep disturbance (sleeping on 2 pillows). The patient is not nervous/anxious.      Vitals:   04/03/19 0936  BP: (!) 88/66  Pulse: 91  Resp: 16  SpO2: 100%  Weight: 135 lb 2 oz (61.3 kg)  Height: 5\' 11"  (1.803 m)   Wt Readings from Last 3 Encounters:  04/03/19 135 lb 2 oz (61.3 kg)  03/30/19 135 lb (61.2 kg)  03/21/19 153 lb 6 oz (69.6 kg)   Lab Results  Component Value Date   CREATININE 1.44 (H) 03/30/2019   CREATININE  1.74 (H) 03/07/2019   CREATININE 2.06 (H) 01/30/2019    Physical Exam Vitals and nursing note reviewed.  Constitutional:      Appearance: Normal appearance.  HENT:     Head: Normocephalic and atraumatic.  Cardiovascular:     Rate and Rhythm: Normal rate and regular rhythm.  Pulmonary:     Effort: Pulmonary effort is normal.     Breath sounds: Normal breath sounds. No wheezing or rales.  Abdominal:     General: There is no distension.     Palpations: Abdomen is soft.     Tenderness: There is no abdominal tenderness.  Musculoskeletal:        General: No tenderness.     Cervical back: Normal range of motion and neck supple.     Right lower leg: No edema.     Left lower leg: No edema.  Skin:    General:  Skin is warm and dry.  Neurological:     General: No focal deficit present.     Mental Status: He is alert and oriented to person, place, and time.  Psychiatric:        Mood and Affect: Mood normal.        Behavior: Behavior normal.        Thought Content: Thought content normal.     Assessment & Plan:  1: Chronic heart failure with reduced ejection fraction- - NYHA class II - euvolemic today - weighing daily and says that his weight has diminished since spironolactone was increased. Reminded to call for an overnight weight gain of >2 pounds or a weekly weight gain of >5 pounds - weight 18 pounds from last visit here 2 weeks ago - not adding salt and his wife is reading food labels closely for sodium content - saw cardiology Mickle Plumb ) 01/18/19 & return 04/24/19 - spironolactone has been increased since ED visit 4 days ago & says that he feels "great" - BNP 03/07/19 was 394.0  2: HTN- - BP low so will decrease hydralazine to 50mg  TID from 100mg  TID - saw PCP at the [place] about a month ago; wife to continue checking BP at home and will let us know if BP doesn't improve - BMP 03/30/19 reviewed and showed sodium 138, potassium 4.0, creatinine 1.44 and GFR 60  3: DM- - saw  endocrinology Pasty Arch) 03/15/19 - A1c 01/18/19 was 11.0% - home glucose yesterday morning was 200 - saw nephrology(K[place]uru) 03/13/19  4: Tobacco use- - smoking 1/2 ppd of cigarettes - cessation discussed for 3 minutes with him    Patient did not bring his medications nor a list. Each medication was verbally reviewed with the patient and he was encouraged to bring the bottles to every visit to confirm accuracy of list.  Return in 6 weeks or sooner for any questions/problems before then. He and his wife are both due to receive their 2nd COVID vaccines in the next couple of weeks and prefer to not have an office visit around those dates.

## 2019-04-03 ENCOUNTER — Other Ambulatory Visit: Payer: Self-pay

## 2019-04-03 ENCOUNTER — Encounter: Payer: Self-pay | Admitting: Family

## 2019-04-03 ENCOUNTER — Ambulatory Visit: Payer: Medicaid Other | Attending: Family | Admitting: Family

## 2019-04-03 VITALS — BP 88/66 | HR 91 | Resp 16 | Ht 71.0 in | Wt 135.1 lb

## 2019-04-03 DIAGNOSIS — Z8673 Personal history of transient ischemic attack (TIA), and cerebral infarction without residual deficits: Secondary | ICD-10-CM | POA: Insufficient documentation

## 2019-04-03 DIAGNOSIS — E1122 Type 2 diabetes mellitus with diabetic chronic kidney disease: Secondary | ICD-10-CM | POA: Insufficient documentation

## 2019-04-03 DIAGNOSIS — I130 Hypertensive heart and chronic kidney disease with heart failure and stage 1 through stage 4 chronic kidney disease, or unspecified chronic kidney disease: Secondary | ICD-10-CM | POA: Diagnosis not present

## 2019-04-03 DIAGNOSIS — R5383 Other fatigue: Secondary | ICD-10-CM | POA: Insufficient documentation

## 2019-04-03 DIAGNOSIS — I5022 Chronic systolic (congestive) heart failure: Secondary | ICD-10-CM

## 2019-04-03 DIAGNOSIS — R531 Weakness: Secondary | ICD-10-CM | POA: Insufficient documentation

## 2019-04-03 DIAGNOSIS — Z794 Long term (current) use of insulin: Secondary | ICD-10-CM

## 2019-04-03 DIAGNOSIS — I5042 Chronic combined systolic (congestive) and diastolic (congestive) heart failure: Secondary | ICD-10-CM | POA: Diagnosis not present

## 2019-04-03 DIAGNOSIS — Z79899 Other long term (current) drug therapy: Secondary | ICD-10-CM | POA: Diagnosis not present

## 2019-04-03 DIAGNOSIS — N182 Chronic kidney disease, stage 2 (mild): Secondary | ICD-10-CM | POA: Diagnosis not present

## 2019-04-03 DIAGNOSIS — R188 Other ascites: Secondary | ICD-10-CM | POA: Insufficient documentation

## 2019-04-03 DIAGNOSIS — Z7982 Long term (current) use of aspirin: Secondary | ICD-10-CM | POA: Diagnosis not present

## 2019-04-03 DIAGNOSIS — I10 Essential (primary) hypertension: Secondary | ICD-10-CM

## 2019-04-03 DIAGNOSIS — F17210 Nicotine dependence, cigarettes, uncomplicated: Secondary | ICD-10-CM | POA: Insufficient documentation

## 2019-04-03 DIAGNOSIS — Z720 Tobacco use: Secondary | ICD-10-CM

## 2019-04-03 DIAGNOSIS — Z8249 Family history of ischemic heart disease and other diseases of the circulatory system: Secondary | ICD-10-CM | POA: Diagnosis not present

## 2019-04-03 DIAGNOSIS — E118 Type 2 diabetes mellitus with unspecified complications: Secondary | ICD-10-CM

## 2019-04-03 NOTE — Patient Instructions (Addendum)
Continue weighing daily and call for an overnight weight gain of > 2 pounds or a weekly weight gain of >5 pounds.   Decrease hydralazine to 1/2 tablet three times a day. (50mg  three times/ day)

## 2019-04-12 ENCOUNTER — Telehealth: Payer: Self-pay | Admitting: Cardiovascular Disease

## 2019-04-12 NOTE — Telephone Encounter (Signed)
Pt c/o BP issue: STAT if pt c/o blurred vision, one-sided weakness or slurred speech  1. What are your last 5 BP readings? 115/85 this morning 89/52 now   2. Are you having any other symptoms (ex. Dizziness, headache, blurred vision, passed out)? Weak tired lightheaded   3. What is your BP issue? Low bp hasnt been this low but wife stopped giving the hydralazine tid as patient bp dropping today 89/52 is abnormally low

## 2019-04-12 NOTE — Telephone Encounter (Signed)
Spoke with patients wife per release form and she states that his blood pressure has been really low. She reports that [facility] did decrease his hydralazine from 100 mg three times a day to 50 mg three times a day. She tried calling their office but they are not in. So she stopped giving him the hydralazine. Blood pressures were as follows:   Yesterday Morning it was 89/57 HR 70 12:30 PM 84/49 HR 74 1:30 PM 103/55 HR 75 3:00 PM 103/63 HR 73 Today it has been: 150/76 HR 76 105/52 HR 72  Instructed her to continue holding the hydralazine, scheduled appointment with APP next week, and requested that she keep a log of his blood pressure readings to bring to appointment. We also discussed hydration during this increasing heat and she has been trying to push the fluids. Confirmed appointment for next week and she was thankful for the time. She verbalized understanding of our conversation, agreement with plan, and had no further questions at this time.

## 2019-04-13 ENCOUNTER — Telehealth: Payer: Self-pay | Admitting: Family

## 2019-04-13 NOTE — Telephone Encounter (Signed)
Called patient and spoke to patient and wife regarding his low BP readings. Saw that wife had called patient's cardiologist and received recommendations and a follow-up appointment.   Hydralazine has been completely stopped and his BP this morning was 111/71 and patient says that he feels "much stronger". Wife is going to continue checking patient's BP and take BP readings to his cardiology appointment next week.   Advised patient and wife to call back should any other questions/concerns arise.

## 2019-04-18 NOTE — Progress Notes (Signed)
Office Visit    Patient Name: Shawn [name] Date of Encounter: 04/18/2019  Primary Care Provider:  [facility] Primary Cardiologist:  Shawn Rogue, [name] Electrophysiologist:  None   Chief Complaint    Shawn [name] is a 64 y.o. male with a hx of f HFrEF, DM2, CKD, CVA, tobacco abuse presents today for low blood pressure readings.   Past Medical History    Past Medical History:  Diagnosis Date  . Chronic combined systolic (congestive) and diastolic (congestive) heart failure ([place])    a. TTE 6/15: EF < 20%, mildly dilated LV, DD, mildly dilated LA, mod dilated RA, mild MR, mild to mod TR, mod increased posterior wall thickness, elevated LA and LVEDP; b. 4/12019 Echo: EF 20-25%, diff HK. Gr1 DD, nl RV fxn.  . CKD (chronic kidney disease), stage II   . Diabetes mellitus with complication ([place])    a. Prior admissions w/ DKA (last 12/2017).  . Essential hypertension   . NICM (nonischemic cardiomyopathy) ([place])    a. 06/2013 Echo: EF<20%; b. 06/2013 Cath: no signif dzs; c. 04/2017 Echo: 20-25%, gr1 DD.; d.05/2018 Echo: 25-30%  . Polysubstance abuse ([place])    a. etoh and tobacco  . Stroke [facility])    Past Surgical History:  Procedure Laterality Date  . NO PAST SURGERIES      Allergies  No Known Allergies  History of Present Illness    Shawn [name] is a 64 y.o. male with a hx of HFrEF, DM2, CKD, CVA, tobacco abuse. He was last seen 04/03/19 by Shawn Price, NP in the heart failure clinic.  Echo 05/11/18 with LVEF 25-30% and mild-moderate MR.   Admitted 01/18/19 for heart failure exacerbation. Entrest was stopped during this admission due to decline in renal function. Seen in the ED 03/30/19 due to abdominal distention and his Spironolactone was increased due to ascites.   At visit 04/03/19 with Shawn Price, NP his blood pressure was low at 88/56. His Hydralazine was reduced to 50mg  TID. Called the office 04/12/19 noting low blood pressures. His wife had  stopped Hydralazine.   His wife is present with him for visit today.  Brings a log of home blood pressures.  Blood pressures seem to be lowest in the morning and higher throughout the day per her report.  Blood pressures range from 84/49-130 5/80.  Overall majority of systolic blood pressure readings are in the low 100s.  Additional blood pressure readings available low.  He reports feeling fatigued, worn out.  Tells me he feels like he is going to the bathroom "all the time "on his increased dose of diuretics.  Is eating regular meals throughout the day.  Blood pressure log:   Monday, 04/16/2019 6:20 AM 128/82 heart rate 73  9:54 AM 87/52 heart rate 71   1:07 PM 95/56 heart rate 72  6 PM 152/70 heart rate 72  Tuesday, 04/17/2019 7:50 AM 107/74 heart rate 78  12:13 PM 129/72 heart rate 77  4 3 PM 109/62 heart rate 84 bpm  8:10 PM 102/64 heart rate 84  EKGs/Labs/Other Studies Reviewed:   The following studies were reviewed today:  Echo 05/11/2018  1. The left ventricle has severely reduced systolic function, with an ejection fraction of 25-30%. The cavity size was mildly dilated. There is mildly increased left ventricular wall thickness. Left ventricular diastolic Doppler parameters are  consistent with pseudonormalization. Left ventrical global hypokinesis without regional wall motion abnormalities.  2. The right ventricle has  normal systolic function. The cavity was normal. There is no increase in right ventricular wall thickness.Unable to estimate RVSP  3. Left atrial size was mildly dilated.  4. Mitral valve regurgitation is mild to moderate  EKG:  EKG is ordered today.  The ekg ordered today demonstrates SR 76 bpm with  LVH with repolarization abnormality.   Recent Labs: 09/23/2018: Magnesium 2.1 03/07/2019: B Natriuretic Peptide 394.0 03/30/2019: ALT 6; BUN 36; Creatinine, Ser 1.44; Hemoglobin 11.3; Platelets 457; Potassium 4.0; Sodium 138  Recent Lipid Panel    Component  Value Date/Time   CHOL 152 06/18/2013 0427   TRIG 67 06/18/2013 0427   HDL 80 (H) 06/18/2013 0427   VLDL 13 06/18/2013 0427   LDLCALC 59 06/18/2013 0427    Home Medications   No outpatient medications have been marked as taking for the 04/20/19 encounter (Appointment) with Shawn Dubonnet, NP.    Review of Systems      Review of Systems  Constitution: Positive for malaise/fatigue. Negative for chills and fever.  Cardiovascular: Negative for chest pain, dyspnea on exertion, leg swelling, near-syncope, orthopnea, palpitations and syncope.  Respiratory: Negative for cough, shortness of breath and wheezing.   Gastrointestinal: Negative for nausea and vomiting.  Neurological: Positive for weakness. Negative for dizziness and light-headedness.   All other systems reviewed and are otherwise negative except as noted above.  Physical Exam    VS:  There were no vitals taken for this visit. , BMI There is no height or weight on file to calculate BMI. GEN: Thin, well developed, in no acute distress. HEENT: normal. Neck: Supple, no JVD, carotid bruits, or masses. Cardiac: RRR, no murmurs, rubs, or gallops. No clubbing, cyanosis, edema.  Radials/DP/PT 2+ and equal bilaterally.  Respiratory:  Respirations regular and unlabored, clear to auscultation bilaterally. GI: Soft, nontender, nondistended, BS + x 4. MS: No deformity or atrophy. Skin: Warm and dry, no rash. Neuro:  Strength and sensation are intact. Psych: Normal affect.   Assessment & Plan    1. HFrEF/NICM -NYHA III.  He is dry appearing on exam-likely over diuresed with subsequent hypotension.  He is down 23 pounds from 1 month prior.  He is down 5 pounds from 2 weeks ago.    His spironolactone was increased during recent ED visit 03/30/2019.  Reduce spironolactone to previous dose of 25 mg daily.    He will remain on torsemide 20 mg twice daily, Entresto 97-23 mg twice daily, KDur 9mEq daily.    Reduce carvedilol to 12.5 mg  twice daily in setting of hypotension and fatigue.  His hydralazine has been previously decreased and subsequently discontinued due to hypotension and would recommend remaining off this medication.    Educated to report weight gain of 2 pounds overnight or 5 pounds in 1 week.  BMP recommended. He declined lab draw after initial stick unsuccessful. Will plan to review his labs at upcoming appt with nephrology. Concern that potassium or renal abnormality could be contributory to his fatigue.   Will call Tuesday to assess response to medications, may require further down-titration of Entresto or diuretics.   2. HTN - Hx of hypertension now with hypotension. Initial BP 72/48. BP 88/48 on repeat. He appears over-diuresed on exam. Changes to antihypertensive agents, as above.  Continue monitoring home blood pressure and will call Tuesday to reassess.  3. DM2 - Follows with endocrinology. 03/15/19 A1c 11.4.  Was prescribed Farxiga by heart failure clinic but not yet started.  We reviewed the indication  for this medication in the setting of diabetes and heart failure.   4. CKD - Follows with nephrology. Careful titration of antihypertensives and diuretics. Has upcoming appointment next Thursday.    5. Tobacco abuse - Smoking cessation encouraged. Recommend utilization of 1800QUITNOW.  6. Hx of CVA - Continue aspirin, statin. Reports no recurrent symptoms.   Disposition: Reduce dose Spironolactone and Coreg. BMP was recommended, but as first stick was unsuccessful he and his wife refused further attempts. I will call Tuesday to check on response to reduces dose medication. Will request labs from nephrology after upcoming visit next Thursday.  Follow up with Shawn Price, NP in 4 weeks as scheduled. Follow up in 2 month(s) with Dr. Rockey Situ or APP.   Shawn Dubonnet, NP 04/18/2019, 8:50 AM

## 2019-04-20 ENCOUNTER — Ambulatory Visit (INDEPENDENT_AMBULATORY_CARE_PROVIDER_SITE_OTHER): Payer: Medicaid Other | Admitting: Family

## 2019-04-20 ENCOUNTER — Encounter: Payer: Self-pay | Admitting: Family

## 2019-04-20 ENCOUNTER — Other Ambulatory Visit: Payer: Self-pay

## 2019-04-20 VITALS — BP 88/48 | HR 76 | Ht 71.0 in | Wt 130.0 lb

## 2019-04-20 DIAGNOSIS — I5022 Chronic systolic (congestive) heart failure: Secondary | ICD-10-CM

## 2019-04-20 MED ORDER — SPIRONOLACTONE 25 MG PO TABS: 25.0000 mg | ORAL_TABLET | Freq: Every day | ORAL | 3 refills | Status: DC

## 2019-04-20 MED ORDER — CARVEDILOL 25 MG PO TABS: 12.5000 mg | ORAL_TABLET | Freq: Two times a day (BID) | ORAL | 3 refills | Status: DC

## 2019-04-20 NOTE — Patient Instructions (Addendum)
Medication Instructions:  Your physician has recommended you make the following change in your medication:   Reduce Spironolactone to 25mg  daily Reduce Coreg to 1/2 tablet (12.5 mg total) twice daily  *If you need a refill on your cardiac medications before your next appointment, please call your pharmacy*   Lab Work: Your physician recommends that you return for lab work today: BMP  If you have labs (blood work) drawn today and your tests are completely normal, you will receive your results only by: Marland Kitchen MyChart Message (if you have MyChart) OR . A paper copy in the mail If you have any lab test that is abnormal or we need to change your treatment, we will call you to review the results.   Testing/Procedures: Your EKG today shows sinus rhythm with stable findings.   Follow-Up: At [facility], you and your health needs are our priority.  As part of our continuing mission to provide you with exceptional heart care, we have created designated Provider Care Teams.  These Care Teams include your primary Cardiologist (physician) and Advanced Practice Providers (APPs -  Physician Assistants and Nurse Practitioners) who all work together to provide you with the care you need, when you need it.  We recommend signing up for the patient portal called "MyChart".  Sign up information is provided on this After Visit Summary.  MyChart is used to connect with patients for Virtual Visits (Telemedicine).  Patients are able to view lab/test results, encounter notes, upcoming appointments, etc.  Non-urgent messages can be sent to your provider as well.   To learn more about what you can do with MyChart, go to [email or web address].    Your next appointment:   In 4 weeks with Darylene Price, NP as scheduled on 05/15/19.  With Dr. Rockey Situ or APP in 2 months.    Other Instructions Call for weight gain of 3lb overnight or 5 lbs in 1 week.

## 2019-04-23 ENCOUNTER — Other Ambulatory Visit: Payer: Self-pay

## 2019-04-23 ENCOUNTER — Inpatient Hospital Stay
Admission: EM | Admit: 2019-04-23 | Discharge: 2019-04-26 | DRG: 637 | Disposition: A | Discharge status: Home / Self Care | Payer: No Typology Code available for payment source | Attending: Internal Medicine | Admitting: Internal Medicine

## 2019-04-23 DIAGNOSIS — A0472 Enterocolitis due to Clostridium difficile, not specified as recurrent: Secondary | ICD-10-CM | POA: Diagnosis present

## 2019-04-23 DIAGNOSIS — E860 Dehydration: Secondary | ICD-10-CM

## 2019-04-23 DIAGNOSIS — R64 Cachexia: Secondary | ICD-10-CM | POA: Diagnosis present

## 2019-04-23 DIAGNOSIS — N189 Chronic kidney disease, unspecified: Secondary | ICD-10-CM | POA: Diagnosis present

## 2019-04-23 DIAGNOSIS — I959 Hypotension, unspecified: Secondary | ICD-10-CM | POA: Diagnosis not present

## 2019-04-23 DIAGNOSIS — I428 Other cardiomyopathies: Secondary | ICD-10-CM

## 2019-04-23 DIAGNOSIS — N1832 Chronic kidney disease, stage 3b: Secondary | ICD-10-CM

## 2019-04-23 DIAGNOSIS — E43 Unspecified severe protein-calorie malnutrition: Secondary | ICD-10-CM | POA: Diagnosis not present

## 2019-04-23 DIAGNOSIS — R197 Diarrhea, unspecified: Secondary | ICD-10-CM | POA: Diagnosis not present

## 2019-04-23 DIAGNOSIS — Z794 Long term (current) use of insulin: Secondary | ICD-10-CM

## 2019-04-23 DIAGNOSIS — I10 Essential (primary) hypertension: Secondary | ICD-10-CM | POA: Diagnosis present

## 2019-04-23 DIAGNOSIS — E871 Hypo-osmolality and hyponatremia: Secondary | ICD-10-CM | POA: Diagnosis not present

## 2019-04-23 DIAGNOSIS — F1910 Other psychoactive substance abuse, uncomplicated: Secondary | ICD-10-CM | POA: Diagnosis present

## 2019-04-23 DIAGNOSIS — E875 Hyperkalemia: Secondary | ICD-10-CM | POA: Diagnosis present

## 2019-04-23 DIAGNOSIS — E1122 Type 2 diabetes mellitus with diabetic chronic kidney disease: Secondary | ICD-10-CM | POA: Diagnosis present

## 2019-04-23 DIAGNOSIS — K521 Toxic gastroenteritis and colitis: Secondary | ICD-10-CM | POA: Diagnosis present

## 2019-04-23 DIAGNOSIS — Z7982 Long term (current) use of aspirin: Secondary | ICD-10-CM

## 2019-04-23 DIAGNOSIS — I081 Rheumatic disorders of both mitral and tricuspid valves: Secondary | ICD-10-CM | POA: Diagnosis present

## 2019-04-23 DIAGNOSIS — E162 Hypoglycemia, unspecified: Secondary | ICD-10-CM | POA: Diagnosis not present

## 2019-04-23 DIAGNOSIS — Z681 Body mass index (BMI) 19 or less, adult: Secondary | ICD-10-CM

## 2019-04-23 DIAGNOSIS — F1010 Alcohol abuse, uncomplicated: Secondary | ICD-10-CM | POA: Diagnosis present

## 2019-04-23 DIAGNOSIS — I5042 Chronic combined systolic (congestive) and diastolic (congestive) heart failure: Secondary | ICD-10-CM | POA: Diagnosis present

## 2019-04-23 DIAGNOSIS — Z20822 Contact with and (suspected) exposure to covid-19: Secondary | ICD-10-CM | POA: Diagnosis not present

## 2019-04-23 DIAGNOSIS — R000 Tachycardia, unspecified: Secondary | ICD-10-CM | POA: Diagnosis not present

## 2019-04-23 DIAGNOSIS — Z79899 Other long term (current) drug therapy: Secondary | ICD-10-CM

## 2019-04-23 DIAGNOSIS — R531 Weakness: Secondary | ICD-10-CM | POA: Diagnosis not present

## 2019-04-23 DIAGNOSIS — E861 Hypovolemia: Secondary | ICD-10-CM | POA: Diagnosis present

## 2019-04-23 DIAGNOSIS — T50B95A Adverse effect of other viral vaccines, initial encounter: Secondary | ICD-10-CM | POA: Diagnosis present

## 2019-04-23 DIAGNOSIS — E1100 Type 2 diabetes mellitus with hyperosmolarity without nonketotic hyperglycemic-hyperosmolar coma (NKHHC): Secondary | ICD-10-CM | POA: Diagnosis not present

## 2019-04-23 DIAGNOSIS — N170 Acute kidney failure with tubular necrosis: Secondary | ICD-10-CM

## 2019-04-23 DIAGNOSIS — I110 Hypertensive heart disease with heart failure: Secondary | ICD-10-CM | POA: Diagnosis present

## 2019-04-23 DIAGNOSIS — Z8673 Personal history of transient ischemic attack (TIA), and cerebral infarction without residual deficits: Secondary | ICD-10-CM | POA: Diagnosis not present

## 2019-04-23 DIAGNOSIS — T502X5A Adverse effect of carbonic-anhydrase inhibitors, benzothiadiazides and other diuretics, initial encounter: Secondary | ICD-10-CM | POA: Diagnosis present

## 2019-04-23 DIAGNOSIS — F17210 Nicotine dependence, cigarettes, uncomplicated: Secondary | ICD-10-CM | POA: Diagnosis present

## 2019-04-23 DIAGNOSIS — R739 Hyperglycemia, unspecified: Secondary | ICD-10-CM

## 2019-04-23 DIAGNOSIS — E1165 Type 2 diabetes mellitus with hyperglycemia: Secondary | ICD-10-CM | POA: Diagnosis not present

## 2019-04-23 DIAGNOSIS — R0902 Hypoxemia: Secondary | ICD-10-CM | POA: Diagnosis not present

## 2019-04-23 DIAGNOSIS — N179 Acute kidney failure, unspecified: Secondary | ICD-10-CM | POA: Diagnosis present

## 2019-04-23 LAB — BASIC METABOLIC PANEL
Anion gap: 16 — ABNORMAL HIGH (ref 5–15)
Anion gap: 10 (ref 5–15)
Anion gap: 12 (ref 5–15)
Anion gap: 15 (ref 5–15)
BUN: 82 mg/dL — ABNORMAL HIGH (ref 8–23)
BUN: 80 mg/dL — ABNORMAL HIGH (ref 8–23)
BUN: 80 mg/dL — ABNORMAL HIGH (ref 8–23)
BUN: 80 mg/dL — ABNORMAL HIGH (ref 8–23)
CO2: 22 mmol/L (ref 22–32)
CO2: 26 mmol/L (ref 22–32)
CO2: 23 mmol/L (ref 22–32)
CO2: 24 mmol/L (ref 22–32)
Calcium: 9.2 mg/dL (ref 8.9–10.3)
Calcium: 9 mg/dL (ref 8.9–10.3)
Calcium: 9 mg/dL (ref 8.9–10.3)
Calcium: 9.7 mg/dL (ref 8.9–10.3)
Chloride: 91 mmol/L — ABNORMAL LOW (ref 98–111)
Chloride: 92 mmol/L — ABNORMAL LOW (ref 98–111)
Chloride: 91 mmol/L — ABNORMAL LOW (ref 98–111)
Chloride: 92 mmol/L — ABNORMAL LOW (ref 98–111)
Creatinine, Ser: 2.18 mg/dL — ABNORMAL HIGH (ref 0.61–1.24)
Creatinine, Ser: 2.04 mg/dL — ABNORMAL HIGH (ref 0.61–1.24)
Creatinine, Ser: 2.1 mg/dL — ABNORMAL HIGH (ref 0.61–1.24)
Creatinine, Ser: 1.92 mg/dL — ABNORMAL HIGH (ref 0.61–1.24)
GFR calc Af Amer: 36 mL/min — ABNORMAL LOW (ref >60)
GFR calc Af Amer: 39 mL/min — ABNORMAL LOW (ref >60)
GFR calc Af Amer: 38 mL/min — ABNORMAL LOW (ref >60)
GFR calc Af Amer: 42 mL/min — ABNORMAL LOW (ref >60)
GFR calc non Af Amer: 31 mL/min — ABNORMAL LOW (ref >60)
GFR calc non Af Amer: 34 mL/min — ABNORMAL LOW (ref >60)
GFR calc non Af Amer: 33 mL/min — ABNORMAL LOW (ref >60)
GFR calc non Af Amer: 36 mL/min — ABNORMAL LOW (ref >60)
Glucose, Bld: 515 mg/dL (ref 70–99)
Glucose, Bld: 536 mg/dL (ref 70–99)
Glucose, Bld: 526 mg/dL (ref 70–99)
Glucose, Bld: 344 mg/dL — ABNORMAL HIGH (ref 70–99)
Potassium: 5.4 mmol/L — ABNORMAL HIGH (ref 3.5–5.1)
Potassium: 7.1 mmol/L (ref 3.5–5.1)
Potassium: 6.1 mmol/L — ABNORMAL HIGH (ref 3.5–5.1)
Potassium: 5 mmol/L (ref 3.5–5.1)
Sodium: 129 mmol/L — ABNORMAL LOW (ref 135–145)
Sodium: 128 mmol/L — ABNORMAL LOW (ref 135–145)
Sodium: 126 mmol/L — ABNORMAL LOW (ref 135–145)
Sodium: 131 mmol/L — ABNORMAL LOW (ref 135–145)

## 2019-04-23 LAB — GLUCOSE, CAPILLARY
Glucose-Capillary: 110 mg/dL — ABNORMAL HIGH (ref 70–99)
Glucose-Capillary: 114 mg/dL — ABNORMAL HIGH (ref 70–99)
Glucose-Capillary: 167 mg/dL — ABNORMAL HIGH (ref 70–99)
Glucose-Capillary: 258 mg/dL — ABNORMAL HIGH (ref 70–99)
Glucose-Capillary: 324 mg/dL — ABNORMAL HIGH (ref 70–99)
Glucose-Capillary: 471 mg/dL — ABNORMAL HIGH (ref 70–99)
Glucose-Capillary: 494 mg/dL — ABNORMAL HIGH (ref 70–99)
Glucose-Capillary: 525 mg/dL (ref 70–99)
Glucose-Capillary: 87 mg/dL (ref 70–99)
Glucose-Capillary: 98 mg/dL (ref 70–99)

## 2019-04-23 LAB — URINALYSIS, COMPLETE (UACMP) WITH MICROSCOPIC
Bacteria, UA: NONE SEEN
Bilirubin Urine: NEGATIVE
Glucose, UA: >=500 mg/dL — AB
Hgb urine dipstick: NEGATIVE
Ketones, ur: NEGATIVE mg/dL
Leukocytes,Ua: NEGATIVE
Nitrite: NEGATIVE
Protein, ur: 30 mg/dL — AB
Specific Gravity, Urine: 1.008 (ref 1.005–1.030)
pH: 5 (ref 5.0–8.0)

## 2019-04-23 LAB — CBC
HCT: 35.1 % — ABNORMAL LOW (ref 39.0–52.0)
Hemoglobin: 11.2 g/dL — ABNORMAL LOW (ref 13.0–17.0)
MCH: 26.1 pg (ref 26.0–34.0)
MCHC: 31.9 g/dL (ref 30.0–36.0)
MCV: 81.8 fL (ref 80.0–100.0)
Platelets: 406 10*3/uL — ABNORMAL HIGH (ref 150–400)
RBC: 4.29 MIL/uL (ref 4.22–5.81)
RDW: 16.1 % — ABNORMAL HIGH (ref 11.5–15.5)
WBC: 5.2 10*3/uL (ref 4.0–10.5)
nRBC: 0 % (ref 0.0–0.2)

## 2019-04-23 LAB — OSMOLALITY: Osmolality: 317 mOsm/kg — ABNORMAL HIGH (ref 275–295)

## 2019-04-23 MED ORDER — SODIUM CHLORIDE 0.9 % IV BOLUS
1000.0000 mL | Freq: Once | INTRAVENOUS | Status: AC
  Administered 2019-04-23: 1000 mL via INTRAVENOUS

## 2019-04-23 MED ORDER — DEXTROSE 50 % IV SOLN: 0.0000 mL | INTRAVENOUS | Status: DC | PRN

## 2019-04-23 MED ORDER — ASPIRIN 81 MG PO CHEW
81.0000 mg | CHEWABLE_TABLET | Freq: Every day | ORAL | Status: DC
  Administered 2019-04-24 – 2019-04-26 (×3): 81 mg via ORAL
  Filled 2019-04-23 (×3): qty 1

## 2019-04-23 MED ORDER — SODIUM CHLORIDE 0.9 % IV SOLN: 1.0000 g | Freq: Once | INTRAVENOUS | Status: DC

## 2019-04-23 MED ORDER — INSULIN ASPART 100 UNIT/ML ~~LOC~~ SOLN
10.0000 [IU] | Freq: Once | SUBCUTANEOUS | Status: AC
  Administered 2019-04-23: 10 [IU] via SUBCUTANEOUS
  Filled 2019-04-23: qty 1

## 2019-04-23 MED ORDER — DEXTROSE-NACL 5-0.45 % IV SOLN: INTRAVENOUS | Status: DC

## 2019-04-23 MED ORDER — CALCIUM GLUCONATE-NACL 1-0.675 GM/50ML-% IV SOLN
1.0000 g | Freq: Once | INTRAVENOUS | Status: AC
  Administered 2019-04-23: 1000 mg via INTRAVENOUS
  Filled 2019-04-23: qty 50

## 2019-04-23 MED ORDER — SODIUM CHLORIDE 0.9 % IV SOLN: INTRAVENOUS | Status: DC

## 2019-04-23 MED ORDER — HEPARIN SODIUM (PORCINE) 5000 UNIT/ML IJ SOLN
5000.0000 [IU] | Freq: Three times a day (TID) | INTRAMUSCULAR | Status: DC
  Administered 2019-04-23 – 2019-04-25 (×5): 5000 [IU] via SUBCUTANEOUS
  Filled 2019-04-23 (×5): qty 1

## 2019-04-23 MED ORDER — INSULIN GLARGINE 100 UNIT/ML ~~LOC~~ SOLN
10.0000 [IU] | SUBCUTANEOUS | Status: DC
  Administered 2019-04-23 – 2019-04-25 (×3): 10 [IU] via SUBCUTANEOUS
  Filled 2019-04-23 (×5): qty 0.1

## 2019-04-23 MED ORDER — INSULIN REGULAR(HUMAN) IN NACL 100-0.9 UT/100ML-% IV SOLN
INTRAVENOUS | Status: DC
  Administered 2019-04-23: 8.5 [IU]/h via INTRAVENOUS
  Filled 2019-04-23: qty 100

## 2019-04-23 MED ORDER — INSULIN ASPART 100 UNIT/ML ~~LOC~~ SOLN
0.0000 [IU] | Freq: Every day | SUBCUTANEOUS | Status: DC
  Administered 2019-04-25: 3 [IU] via SUBCUTANEOUS
  Filled 2019-04-23: qty 1

## 2019-04-23 MED ORDER — SODIUM ZIRCONIUM CYCLOSILICATE 10 G PO PACK
10.0000 g | PACK | Freq: Every day | ORAL | Status: AC
  Administered 2019-04-24: 10 g via ORAL
  Filled 2019-04-23 (×3): qty 1

## 2019-04-23 MED ORDER — INSULIN ASPART 100 UNIT/ML ~~LOC~~ SOLN
0.0000 [IU] | Freq: Three times a day (TID) | SUBCUTANEOUS | Status: DC
  Administered 2019-04-24 – 2019-04-25 (×4): 2 [IU] via SUBCUTANEOUS
  Administered 2019-04-25: 5 [IU] via SUBCUTANEOUS
  Administered 2019-04-26: 7 [IU] via SUBCUTANEOUS
  Filled 2019-04-23 (×6): qty 1

## 2019-04-23 MED ORDER — BACLOFEN 10 MG PO TABS
10.0000 mg | ORAL_TABLET | Freq: Two times a day (BID) | ORAL | Status: DC | PRN
  Filled 2019-04-23: qty 1

## 2019-04-23 MED ORDER — B COMPLEX-C PO TABS
1.0000 | ORAL_TABLET | Freq: Every day | ORAL | Status: DC
  Administered 2019-04-24 – 2019-04-26 (×3): 1 via ORAL
  Filled 2019-04-23 (×4): qty 1

## 2019-04-23 MED ORDER — ATORVASTATIN CALCIUM 20 MG PO TABS
20.0000 mg | ORAL_TABLET | Freq: Every day | ORAL | Status: DC
  Administered 2019-04-23 – 2019-04-25 (×3): 20 mg via ORAL
  Filled 2019-04-23 (×3): qty 1

## 2019-04-23 MED ORDER — LOPERAMIDE HCL 2 MG PO CAPS: 2.0000 mg | ORAL_CAPSULE | ORAL | Status: DC | PRN

## 2019-04-23 MED ORDER — INSULIN ASPART 100 UNIT/ML ~~LOC~~ SOLN
3.0000 [IU] | Freq: Three times a day (TID) | SUBCUTANEOUS | Status: DC
  Administered 2019-04-24 – 2019-04-26 (×7): 3 [IU] via SUBCUTANEOUS
  Filled 2019-04-23 (×7): qty 1

## 2019-04-23 MED ORDER — INSULIN REGULAR(HUMAN) IN NACL 100-0.9 UT/100ML-% IV SOLN
INTRAVENOUS | Status: DC
  Administered 2019-04-23: 4.8 [IU]/h via INTRAVENOUS

## 2019-04-23 MED ORDER — CARVEDILOL 6.25 MG PO TABS: 12.5000 mg | ORAL_TABLET | Freq: Two times a day (BID) | ORAL | Status: DC

## 2019-04-23 NOTE — ED Notes (Signed)
Date and time results received: 04/23/19 1607PM (use smartphrase ".now" to insert current time)  Test: Glucose Critical Value: 526  Name of Provider Notified: Dr. Kerman Passey  Orders Received? Or Actions Taken?: No new orders at this time

## 2019-04-23 NOTE — ED Notes (Signed)
Date and time results received: 04/23/19 3:28 PM  (use smartphrase ".now" to insert current time)  Test: Glucose Critical Value: 536  Name of Provider Notified: Dr. Jimmye Norman   Orders Received? Or Actions Taken?: No new orders at this time

## 2019-04-23 NOTE — ED Notes (Signed)
Date and time results received: 04/23/19 1208 PM (use smartphrase ".now" to insert current time)  Test: Glucose Critical Value: 515  Name of Provider Notified: Dr. Jimmye Norman  Orders Received? Or Actions Taken?: No new orders at this time

## 2019-04-23 NOTE — ED Notes (Signed)
Date and time results received: 04/23/19 4:08 PM  (use smartphrase ".now" to insert current time)  Test: K+ Critical Value: 6.1  Name of Provider Notified: Dr. Kerman Passey  Orders Received? Or Actions Taken?: Actions Taken: Administration of insulin and calcium gluconate

## 2019-04-23 NOTE — ED Provider Notes (Signed)
[facility] Emergency Department Provider Note       Time seen: ----------------------------------------- 11:06 AM on 04/23/2019 -----------------------------------------   I have reviewed the triage vital signs and the nursing notes.  HISTORY   Chief Complaint Weakness and Hypotension    HPI Shawn [name] is a 64 y.o. male with a history of CHF, CKD, diabetes, polysubstance abuse, CVA who presents to the ED for weakness and low blood pressure, also diarrhea.  Wife took blood pressure at home and it read 60/30.  He was given 400 cc of fluid in route and blood pressure had improved.  Blood glucose is elevated as well on arrival  Past Medical History:  Diagnosis Date  . Chronic combined systolic (congestive) and diastolic (congestive) heart failure ([place])    a. TTE 6/15: EF < 20%, mildly dilated LV, DD, mildly dilated LA, mod dilated RA, mild MR, mild to mod TR, mod increased posterior wall thickness, elevated LA and LVEDP; b. 4/12019 Echo: EF 20-25%, diff HK. Gr1 DD, nl RV fxn.  . CKD (chronic kidney disease), stage II   . Diabetes mellitus with complication ([place])    a. Prior admissions w/ DKA (last 12/2017).  . Essential hypertension   . NICM (nonischemic cardiomyopathy) ([place])    a. 06/2013 Echo: EF<20%; b. 06/2013 Cath: no signif dzs; c. 04/2017 Echo: 20-25%, gr1 DD.; d.05/2018 Echo: 25-30%  . Polysubstance abuse ([place])    a. etoh and tobacco  . Stroke [facility])     Patient Active Problem List   Diagnosis Date Noted  . Congestive heart failure with cardiomyopathy ([place]) 01/18/2019  . AKI (acute kidney injury) ([place])   . Fluid retention   . Nonketotic hyperglycinemia ([place]) 09/22/2018  . Nonketotic hypoglycemia 09/22/2018  . DKA, type 2 ([place]) 08/07/2018  . Essential hypertension 05/12/2018  . Chronic systolic CHF (congestive heart failure) ([place]) 12/15/2017  . NICM (nonischemic cardiomyopathy) ([place]) 05/13/2017  . Type 2 diabetes mellitus with stage 3b  chronic kidney disease, with long-term current use of insulin ([place]) 05/13/2017  . Smoker 05/13/2017  . Polysubstance abuse ([place]) 05/13/2017  . Alcohol abuse 05/13/2017  . Diabetic ketoacidosis without coma associated with type 2 diabetes mellitus ([place]) 04/27/2017    Past Surgical History:  Procedure Laterality Date  . NO PAST SURGERIES      Allergies Patient has no known allergies.  Social History Social History   Tobacco Use  . Smoking status: Current Every Day Smoker    Packs/day: 0.50    Types: Cigarettes  . Smokeless tobacco: Never Used  Substance Use Topics  . Alcohol use: Not Currently  . Drug use: Not on file    Review of Systems Constitutional: Negative for fever. Cardiovascular: Negative for chest pain. Respiratory: Negative for shortness of breath. Gastrointestinal: Negative for abdominal pain, positive for diarrhea Musculoskeletal: Negative for back pain. Skin: Negative for rash. Neurological: Negative for headaches, positive for weakness  All systems negative/normal/unremarkable except as stated in the HPI  ____________________________________________   PHYSICAL EXAM:  VITAL SIGNS: ED Triage Vitals  Enc Vitals Group     BP 04/23/19 1103 (!) 96/58     Pulse Rate 04/23/19 1102 79     Resp 04/23/19 1102 15     Temp 04/23/19 1103 98.6 F (37 C)     Temp Source 04/23/19 1102 Oral     SpO2 04/23/19 1101 95 %     Weight 04/23/19 1104 130 lb (59 kg)     Height 04/23/19 1104 5'  11" (1.803 m)     Head Circumference --      Peak Flow --      Pain Score 04/23/19 1102 0     Pain Loc --      Pain Edu? --      Excl. in [place]? --     Constitutional: Alert and oriented. Well appearing and in no distress. Eyes: Conjunctivae are normal. Normal extraocular movements. ENT      Head: Normocephalic and atraumatic.      Nose: No congestion/rhinnorhea.      Mouth/Throat: Mucous membranes are moist.      Neck: No stridor. Cardiovascular: Normal rate, regular  rhythm. No murmurs, rubs, or gallops. Respiratory: Normal respiratory effort without tachypnea nor retractions. Breath sounds are clear and equal bilaterally. No wheezes/rales/rhonchi. Gastrointestinal: Soft and nontender. Normal bowel sounds Musculoskeletal: Nontender with normal range of motion in extremities. No lower extremity tenderness nor edema. Neurologic:  Normal speech and language. No gross focal neurologic deficits are appreciated.  Skin:  Skin is warm, dry and intact. No rash noted. Psychiatric: Mood and affect are normal. Speech and behavior are normal.  ____________________________________________  EKG: Interpreted by me.  Sinus rhythm rate of 79 bpm, LVH, normal axis, normal QT  ____________________________________________  ED COURSE:  As part of my medical decision making, I reviewed the following data within the [place] History obtained from family if available, nursing notes, old chart and ekg, as well as notes from prior ED visits. Patient presented for weakness and diarrhea, we will assess with labs and imaging as indicated at this time. Clinical Course as of Apr 22 1544  Mon Apr 23, 2019  1417 Patient reports to feeling better after fluids, I will give subcutaneous insulin and repeat basic metabolic panel   [JW]    Clinical Course User Index [JW] Earleen Newport, MD   Procedures  Shawn [name] was evaluated in Emergency Department on 04/23/2019 for the symptoms described in the history of present illness. He was evaluated in the context of the global COVID-19 pandemic, which necessitated consideration that the patient might be at risk for infection with the SARS-CoV-2 virus that causes COVID-19. Institutional protocols and algorithms that pertain to the evaluation of patients at risk for COVID-19 are in a state of rapid change based on information released by regulatory bodies including the CDC and federal and state organizations. These  policies and algorithms were followed during the patient's care in the ED.  ____________________________________________   LABS (pertinent positives/negatives)  Labs Reviewed  BASIC METABOLIC PANEL - Abnormal; Notable for the following components:      Result Value   Sodium 129 (*)    Potassium 5.4 (*)    Chloride 91 (*)    Glucose, Bld 515 (*)    BUN 82 (*)    Creatinine, Ser 2.18 (*)    GFR calc non Af Amer 31 (*)    GFR calc Af Amer 36 (*)    Anion gap 16 (*)    All other components within normal limits  CBC - Abnormal; Notable for the following components:   Hemoglobin 11.2 (*)    HCT 35.1 (*)    RDW 16.1 (*)    Platelets 406 (*)    All other components within normal limits  URINALYSIS, COMPLETE (UACMP) WITH MICROSCOPIC - Abnormal; Notable for the following components:   Color, Urine YELLOW (*)    APPearance CLOUDY (*)    Glucose, UA >=500 (*)  Protein, ur 30 (*)    All other components within normal limits  GLUCOSE, CAPILLARY - Abnormal; Notable for the following components:   Glucose-Capillary 525 (*)    All other components within normal limits  GLUCOSE, CAPILLARY - Abnormal; Notable for the following components:   Glucose-Capillary 471 (*)    All other components within normal limits  BASIC METABOLIC PANEL - Abnormal; Notable for the following components:   Sodium 128 (*)    Potassium 7.1 (*)    Chloride 92 (*)    Glucose, Bld 536 (*)    BUN 80 (*)    Creatinine, Ser 2.04 (*)    GFR calc non Af Amer 34 (*)    GFR calc Af Amer 39 (*)    All other components within normal limits  GLUCOSE, CAPILLARY - Abnormal; Notable for the following components:   Glucose-Capillary 494 (*)    All other components within normal limits  BASIC METABOLIC PANEL  CBG MONITORING, ED  CBG MONITORING, ED  ____________________________________________   DIFFERENTIAL DIAGNOSIS   Dehydration, electrolyte abnormality, infectious diarrhea, sepsis, heart failure, renal  failure  FINAL ASSESSMENT AND PLAN  Dehydration, hyperglycemia   Plan: The patient had presented for weakness and diarrhea. Patient's labs initially revealed some acute kidney injury as well as hyperglycemia but he has been relatively asymptomatic.  His diarrhea has stopped and he has been unable to provide [place] a specimen.  We have given him IV fluids gently as well as subcutaneous insulin.  We are rechecking a basic metabolic panel.  Should his hyperglycemia persist he may require an insulin drip.   Laurence Aly, MD    Note: This note was generated in part or whole with voice recognition software. Voice recognition is usually quite accurate but there are transcription errors that can and very often do occur. I apologize for any typographical errors that were not detected and corrected.     Earleen Newport, MD 04/23/19 [phone]

## 2019-04-23 NOTE — ED Triage Notes (Signed)
Pt to ED via [facility] from home. Per EMS pt started having diarrhea around 2am. Pt wife took BP at home and read 60/30. EMS called and repeat BP 58/32. Pt given 41mL in route with repeat BP 98/72. CBG >500 with EMS.  Upon arrival pt A&Ox4. Pt c/o weakness. Pt denies pain. Pt with hx DM, CHF, HTN.

## 2019-04-23 NOTE — ED Notes (Signed)
Date and time results received: 04/23/19 3:27 PM  (use smartphrase ".now" to insert current time)  Test: Potassium Critical Value: K+ 7.1  Name of Provider Notified: Dr. Jimmye Norman   Orders Received? Or Actions Taken?: No new orders at this time

## 2019-04-23 NOTE — ED Notes (Signed)
This RN at bedside to get repeat BMP per MD orders. This RN used straight stick to obtain sample. Repeat POC CBG performed at this time

## 2019-04-23 NOTE — H&P (Signed)
TRH H&P   Patient Demographics:    Shawn [name], is a 64 y.o. male  MRN: [id number]   DOB - 1955-04-18  Admit Date - 04/23/2019  Outpatient Primary MD for the patient is [facility]  Referring MD: Dr. Kerman Passey  Outpatient Specialists: None  Patient coming from: Home  Chief Complaint  Patient presents with  . Weakness  . Hypotension      HPI:    Shawn [name]  is a 64 y.o. male, with history of nonischemic cardiomyopathy (EF of 30%), uncontrolled diabetes with diabetic nephropathy, CKD stage IIIb, polysubstance abuse and history of CVA who was brought into the ED with increasing weakness, low blood pressure and diarrhea.  When his wife took his blood pressure at home it read 60/30.  He was seen in the 3 weeks back for abdominal ascites and Aldactone dose was increased.  He was then seen in the office 3 days back and appears dry and hypotensive with significant drop in weight (23 pounds in 1 month and 5 pounds in the last 2 weeks).  He complained of fatigue and malaise and systolic blood pressure was reported to be in the 80s.  His Coreg dose was reduced to 12.5 mg daily (from twice daily) and recommended to remain on torsemide, Entresto and potassium supplement.    Patient reports that he took the second dose of COVID-19 vaccine on 4/12 and since then was having about 2 episodes of watery diarrhea daily.  Since yesterday he has had several episodes of watery diarrhea (6-7).  No fevers, abdominal pain, nausea, vomiting or chills.  No blood in stool.  No recent antibiotic use.  Symptoms are associated with weakness and fatigue.  Denies any chest pain, shortness of breath, palpitation or dysuria.  Reports polyuria and increased thirst.  Also reports that he is blood glucose was frequently going into the 300s.  Reports he has quit drinking since past 3 months but  still smokes few cigarettes a day.  No illicit drug use.  In the ED patient was hyperglycemic with blood glucose in the 500s, anion gap of 12.  Had potassium of 7.1.  Given IV insulin and calcium gluconate.  EKG unremarkable.  Subsequently potassium improved to 6.1 but blood glucose still in 500s and started on insulin drip and hospitalist consulted for admission.  Patient hyponatremic at 128 and noted to have acute on chronic kidney disease with worsening creatinine of 2.18 from baseline around 1.4.     Review of systems:    In addition to the HPI above, No Fever-chills, No Headache, No changes with Vision or hearing, No problems swallowing food or Liquids, No Chest pain, Cough or Shortness of Breath, No Abdominal pain, No Nausea or Vommitting, diarrhea++++ No Blood in stool or Urine, No dysuria, No new skin rashes or bruises, No new joints pains-aches,  Generalized weakness +++,  no tingling, numbness in any extremity, Weight loss + Polyuria and polydipsia No significant Mental Stressors.     With Past History of the following :    Past Medical History:  Diagnosis Date  . Chronic combined systolic (congestive) and diastolic (congestive) heart failure ([place])    a. TTE 6/15: EF < 20%, mildly dilated LV, DD, mildly dilated LA, mod dilated RA, mild MR, mild to mod TR, mod increased posterior wall thickness, elevated LA and LVEDP; b. 4/12019 Echo: EF 20-25%, diff HK. Gr1 DD, nl RV fxn.  . CKD (chronic kidney disease), stage II   . Diabetes mellitus with complication ([place])    a. Prior admissions w/ DKA (last 12/2017).  . Essential hypertension   . NICM (nonischemic cardiomyopathy) ([place])    a. 06/2013 Echo: EF<20%; b. 06/2013 Cath: no signif dzs; c. 04/2017 Echo: 20-25%, gr1 DD.; d.05/2018 Echo: 25-30%  . Polysubstance abuse ([place])    a. etoh and tobacco  . Stroke [facility])       Past Surgical History:  Procedure Laterality Date  . NO PAST SURGERIES        Social History:      Social History   Tobacco Use  . Smoking status: Current Every Day Smoker    Packs/day: 0.50    Types: Cigarettes  . Smokeless tobacco: Never Used  Substance Use Topics  . Alcohol use: Not Currently     Lives -Home with wife  Mobility -independent    Family History :     Family History  Problem Relation Age of Onset  . Congestive Heart Failure Mother   . Diabetes Mother   . Congestive Heart Failure Brother      Home Medications:   Prior to Admission medications   Medication Sig Start Date End Date Taking? Authorizing Provider  aspirin 81 MG chewable tablet Chew 1 tablet (81 mg total) by mouth daily. 04/30/17  Yes Demetrios Loll, MD  atorvastatin (LIPITOR) 40 MG tablet Take 0.5 tablets (20 mg total) by mouth at bedtime. 09/26/18  Yes Minna Merritts, MD  B-Complex-C TABS Take 1 tablet by mouth daily.   Yes [provider]  carvedilol (COREG) 25 MG tablet Take 0.5 tablets (12.5 mg total) by mouth 2 (two) times daily with a meal. 04/20/19  Yes Loel Dubonnet, NP  cetirizine (ZYRTEC) 10 MG tablet Take 10 mg by mouth daily.   Yes [provider]  insulin aspart (NOVOLOG) 100 UNIT/ML injection Inject 5 Units into the skin 3 (three) times daily with meals.   Yes [provider]  insulin glargine (LANTUS) 100 UNIT/ML injection Inject 20 Units into the skin at bedtime.    Yes [provider]  omeprazole (PRILOSEC) 20 MG capsule Take 20 mg by mouth daily. 12/18/18  Yes [provider]  potassium chloride SA (K-DUR) 20 MEQ tablet Take 1 tablet (20 mEq total) by mouth daily. 09/26/18  Yes Gollan, Kathlene November, MD  sacubitril-valsartan (ENTRESTO) 97-103 MG Take 1 tablet by mouth 2 (two) times daily. 03/29/19  Yes Minna Merritts, MD  spironolactone (ALDACTONE) 25 MG tablet Take 1 tablet (25 mg total) by mouth at bedtime. 04/20/19  Yes Loel Dubonnet, NP  torsemide (DEMADEX) 20 MG tablet Take 1 tablet (20 mg total) by mouth 2 (two) times daily.  01/31/19  Yes Hackney, Otila Kluver A, FNP  baclofen (LIORESAL) 10 MG tablet Take 10 mg by mouth 2 (two) times daily as needed for muscle spasms. Muscle spasm  [provider]  dapagliflozin propanediol (FARXIGA) 10 MG TABS tablet Take 10 mg by mouth daily before breakfast. Patient not taking: Reported on 04/23/2019 03/21/19   Alisa Graff, FNP  polyethylene glycol (MIRALAX / GLYCOLAX) 17 g packet Take 17 g by mouth daily. 03/06/19   Alisa Graff, FNP     Allergies:    No Known Allergies   Physical Exam:   Vitals  Blood pressure 101/75, pulse 78, temperature 98.6 F (37 C), temperature source Oral, resp. rate 14, height 5\' 11"  (1.803 m), weight 59 kg, SpO2 100 %.   General: Middle-aged male lying in bed appears fatigued, not in distress HEENT: Pupils reactive bilaterally, EOMI, pallor present, no icterus, temporal wasting, moist mucosa, supple neck, no JVD Chest: Clear to auscultation bilateral, no added sound CVS: Normal S1-S2, no murmur rub or gallop GI: Soft, no abdominal distention, bowel sounds present, nontender Musculoskeletal: Warm, no edema CNs: Alert and oriented, nonfocal exam, no tremors   Data Review:    CBC Recent Labs  Lab 04/23/19 1108  WBC 5.2  HGB 11.2*  HCT 35.1*  PLT 406*  MCV 81.8  MCH 26.1  MCHC 31.9  RDW 16.1*   ------------------------------------------------------------------------------------------------------------------  Chemistries  Recent Labs  Lab 04/23/19 1108 04/23/19 1452 04/23/19 1538  NA 129* 128* 126*  K 5.4* 7.1* 6.1*  CL 91* 92* 91*  CO2 22 26 23   GLUCOSE 515* 536* 526*  BUN 82* 80* 80*  CREATININE 2.18* 2.04* 2.10*  CALCIUM 9.2 9.0 9.0   ------------------------------------------------------------------------------------------------------------------ estimated creatinine clearance is 30.4 mL/min (A) (by C-G formula based on SCr of 2.1 mg/dL  (H)). ------------------------------------------------------------------------------------------------------------------ No results for input(s): TSH, T4TOTAL, T3FREE, THYROIDAB in the last 72 hours.  Invalid input(s): FREET3  Coagulation profile No results for input(s): INR, PROTIME in the last 168 hours. ------------------------------------------------------------------------------------------------------------------- No results for input(s): DDIMER in the last 72 hours. -------------------------------------------------------------------------------------------------------------------  Cardiac Enzymes No results for input(s): CKMB, TROPONINI, MYOGLOBIN in the last 168 hours.  Invalid input(s): CK ------------------------------------------------------------------------------------------------------------------    Component Value Date/Time   BNP 394.0 (H) 03/07/2019 0956     ---------------------------------------------------------------------------------------------------------------  Urinalysis    Component Value Date/Time   COLORURINE YELLOW (A) 04/23/2019 1106   APPEARANCEUR CLOUDY (A) 04/23/2019 1106   LABSPEC 1.008 04/23/2019 1106   PHURINE 5.0 04/23/2019 1106   GLUCOSEU >=500 (A) 04/23/2019 1106   HGBUR NEGATIVE 04/23/2019 1106   [place] 04/23/2019 1106   KETONESUR NEGATIVE 04/23/2019 1106   PROTEINUR 30 (A) 04/23/2019 1106   NITRITE NEGATIVE 04/23/2019 1106   LEUKOCYTESUR NEGATIVE 04/23/2019 1106    ----------------------------------------------------------------------------------------------------------------   Imaging Results:    No results found.  My personal review of EKG: Normal sinus rhythm at 79 with ST depression in inferior leads and peaked T waves in anterior leads.  (V1-V4)   Assessment & Plan:   Principal problem Hyperosmolar nonketotic state due to type 2 diabetes mellitus ([place]) Initiated insulin drip in the ED.  Monitor BMET  every 4 hours.  Monitor K and mag.  Gentle IV hydration only.  Continue telemetry monitor. Patient has uncontrolled diabetes with A1c of 9.6.  On Lantus with Premeal aspart and Farxiga at home.   Active Problems: Hyperkalemia Likely in the setting of increased dose of Aldactone recently.  Patient also on Entresto and continues to take potassium supplement.  This is likely contributing to his weakness and fatigue (along with diarrhea).  Received calcium gluconate in the ED along with IV insulin with some improvement.  Placed on telemetry.  Will order Lokelma 10 mg daily x2. Recheck potassium in a.m.  Hold off on Entresto and Aldactone.     NICM (nonischemic cardiomyopathy) ([place]) Appears hypovolemic.  Resume  aspirin and statin.  1 L fluid bolus in the ED.  Still appears quite dry and I will place him on gentle hydration with normal saline at 75 cc/h.  Monitor strict I/O given his severe cardiomyopathy. We will consult his cardiologist as needed.  Hyponatremia Suspect prerenal with hypovolemia and hyperglycemia.  Monitor on telemetry.  Gentle hydration overnight. Patient and his wife report he has not had alcohol in 3 months.     Acute renal failure superimposed on stage 3b chronic kidney disease ([name]) Prerenal secondary to diarrhea, overdiuresis and concomitant use of Entresto, torsemide and increased dose of Aldactone.  Hold off all his medications.  Received IV fluid bolus in the ED. Will maintain on gentle IV hydration.  Avoid nephrotoxins.     Diarrhea Suspect this is adverse reaction immediately following his recent vaccination.  No constitutional symptoms including fever, abdominal pain.  Will hold off on stool studies.  Ordered Imodium as needed.  DVT Prophylaxis: Subcu heparin AM Labs Ordered, also please review Full Orders  Family Communication: Admission, patients condition and plan of care including tests being ordered have been discussed with the patient and his wife at  bedside   Code Status full code  Likely DC to home  Condition fair  Consults called: None  Admission status: Inpatient  Time spent in minutes : 60   Zayah Keilman M.D on 04/23/2019 at 5:25 PM  Between 7am to 7pm - Pager - [phone]. After 7pm go to [email or web address] - password [facility]  [facility] - Office  [phone]

## 2019-04-23 NOTE — ED Notes (Signed)
Waiting on Lantus from pharmacy to begin transition off of IV insulin.

## 2019-04-24 ENCOUNTER — Ambulatory Visit: Payer: Medicaid Other | Admitting: Cardiovascular Disease

## 2019-04-24 ENCOUNTER — Telehealth: Payer: Self-pay

## 2019-04-24 LAB — BASIC METABOLIC PANEL
Anion gap: 9 (ref 5–15)
BUN: 76 mg/dL — ABNORMAL HIGH (ref 8–23)
CO2: 26 mmol/L (ref 22–32)
Calcium: 9.2 mg/dL (ref 8.9–10.3)
Chloride: 99 mmol/L (ref 98–111)
Creatinine, Ser: 1.44 mg/dL — ABNORMAL HIGH (ref 0.61–1.24)
GFR calc Af Amer: 60 mL/min — ABNORMAL LOW (ref >60)
GFR calc non Af Amer: 52 mL/min — ABNORMAL LOW (ref >60)
Glucose, Bld: 134 mg/dL — ABNORMAL HIGH (ref 70–99)
Potassium: 5.2 mmol/L — ABNORMAL HIGH (ref 3.5–5.1)
Sodium: 134 mmol/L — ABNORMAL LOW (ref 135–145)

## 2019-04-24 LAB — HIV ANTIBODY (ROUTINE TESTING W REFLEX): HIV Screen 4th Generation wRfx: NONREACTIVE

## 2019-04-24 LAB — GLUCOSE, CAPILLARY
Glucose-Capillary: 111 mg/dL — ABNORMAL HIGH (ref 70–99)
Glucose-Capillary: 183 mg/dL — ABNORMAL HIGH (ref 70–99)
Glucose-Capillary: 191 mg/dL — ABNORMAL HIGH (ref 70–99)
Glucose-Capillary: 167 mg/dL — ABNORMAL HIGH (ref 70–99)
Glucose-Capillary: 96 mg/dL (ref 70–99)

## 2019-04-24 LAB — SARS CORONAVIRUS 2 (TAT 6-24 HRS): SARS Coronavirus 2: NEGATIVE

## 2019-04-24 MED ORDER — EPINEPHRINE 1 MG/10ML IJ SOSY: PREFILLED_SYRINGE | INTRAMUSCULAR | Status: AC | PRN

## 2019-04-24 MED ORDER — CARVEDILOL 3.125 MG PO TABS
3.1250 mg | ORAL_TABLET | Freq: Two times a day (BID) | ORAL | Status: DC
  Administered 2019-04-24 – 2019-04-26 (×4): 3.125 mg via ORAL
  Filled 2019-04-24 (×4): qty 1

## 2019-04-24 NOTE — Telephone Encounter (Signed)
-----   Message from Rise Mu, PA-C sent at 04/24/2019  3:06 PM EDT ----- Can you get this patient in to see [facility] Monday, 04/30/2019 for continuity of care? He is a hospital follow up related to her last visit.   Thanks!

## 2019-04-24 NOTE — Progress Notes (Signed)
PROGRESS NOTE    Shawn [name]  OZH:[id number] DOB: May 22, 1955 DOA: 04/23/2019 PCP: [facility] (Confirm with patient/family/NH records and if not entered, this HAS to be entered at [facility] point of entry. "No PCP" if truly none.)   Chief Complaint  Patient presents with  . Weakness  . Hypotension    Brief Narrative: 64 year old male with nonischemic cardiomyopathy ((EF of 30%), uncontrolled diabetes lightest with diabetic nephropathy, CKD stage IIIb,  history of CVA who presented to the ED with increasing weakness, diarrhea for past 5 days and hypotension.  Patient reports that he started having diarrhea after receiving second dose of COVID-19 vaccine. 1 month back he was seen in the ED for abdominal distention and his Aldactone dose was increased.  Seen in the cardiology office 3 days prior to admission and noted to have significant weight loss (almost 23 pounds within the month) with findings of low blood pressure.  Coreg dose was reduced to 12.5 mg daily.  Patient remains on a diuretic, Entresto and potassium supplement.  Patient found to have hypotension, blood glucose in the 500s, potassium of 7.1.  Received IV insulin and calcium gluconate.  Also was hyponatremic with acute on chronic kidney disease. Admitted for further management.   Assessment & Plan:  Principal problem  Hyperosmolar nonketotic state due to type 2 diabetes mellitus ([name]) Required insulin drip on admission, CBG improved and off insulin drip.  Placed on Lantus and Premeal aspart.   Active Problems: Hyponatremia with hypotension Prerenal hyponatremia with hypovolemia + hyperglycemia.  Received IV fluid bolus in the ED followed by gentle hydration.  Sodium improved and of fluids.  Resume beta-blocker as blood pressure better..  Remaining home blood pressure meds are held.  Hyperkalemia In the setting of increased dose of Aldactone, continued use of Entresto and potassium supplement. Potassium  improved to 5.2 with IV insulin and Lokelma.  Continue to hold Entresto, potassium supplement and Aldactone. Stable on telemetry.       NICM (nonischemic cardiomyopathy) ([place]) Hypovolemic on presentation due to overdiuresis and diarrhea..  Continue aspirin and statin.    Now improving with gentle hydration.  Continue strict I's/O and daily weight. All home meds (beta-blocker, diuretic, Entresto) were held due to hypotension and AKI.  Cardiology consulted on medication adjustment.      Acute renal failure superimposed on stage 3b chronic kidney disease ([name]) Prerenal secondary to diarrhea, overdiuresis and concomitant use of Entresto, torsemide and increased dose of Aldactone.    Off all these medications and avoid NSAIDs.  Renal function improved with IV hydration.     Diarrhea Likely adverse reaction to recent COVID-19 vaccine.  No signs of infection.  Resolved since admission.  Generalized weakness PT evaluation  History of alcohol use Patient and his wife report that he has quit for almost 3 months.  DVT Prophylaxis: Subcu heparin     Code Status full code  Family communication: Wife involved in care   Status is: Inpatient  Remains inpatient appropriate because:Persistent severe electrolyte disturbances.  Still having generalized weakness and hyperkalemia.  Needs to be monitored overnight for stable blood pressure and repeat be made for stable potassium and renal function   Dispo: The patient is from: Home              Anticipated d/c is to: Home              Anticipated d/c date is: 1 day  Patient currently is not medically stable to d/c.        Consultants:  Cardiology Procedures: None     Subjective: Reports feeling weak but better since yesterday.  No further diarrhea.  Objective: Vitals:   04/24/19 0830 04/24/19 0941 04/24/19 0949 04/24/19 1155  BP: 115/74  106/78 112/72  Pulse: 67  68 67  Resp: 10  19 18   Temp:   97.8 F  (36.6 C) 98.5 F (36.9 C)  TempSrc:   Oral Oral  SpO2: 96%  100% 100%  Weight:  59 kg    Height:  5\' 11"  (1.803 m)      Intake/Output Summary (Last 24 hours) at 04/24/2019 1411 Last data filed at 04/24/2019 1330 Gross per 24 hour  Intake 240 ml  Output 375 ml  Net -135 ml   Filed Weights   04/23/19 1104 04/24/19 0941  Weight: 59 kg 59 kg    Examination:  General: Not in distress, fatigued HEENT: Moist mucosa, supple neck Chest: Clear to auscultation bilaterally CVs: Normal S1-S2, no murmurs GI: Soft, nondistended, nontender Musculoskeletal: Warm, no edema   Data Reviewed: I have personally reviewed following labs and imaging studies  CBC: Recent Labs  Lab 04/23/19 1108  WBC 5.2  HGB 11.2*  HCT 35.1*  MCV 81.8  PLT 406*    Basic Metabolic Panel: Recent Labs  Lab 04/23/19 1108 04/23/19 1452 04/23/19 1538 04/23/19 1755 04/24/19 0600  NA 129* 128* 126* 131* 134*  K 5.4* 7.1* 6.1* 5.0 5.2*  CL 91* 92* 91* 92* 99  CO2 22 26 23 24 26   GLUCOSE 515* 536* 526* 344* 134*  BUN 82* 80* 80* 80* 76*  CREATININE 2.18* 2.04* 2.10* 1.92* 1.44*  CALCIUM 9.2 9.0 9.0 9.7 9.2    GFR: Estimated Creatinine Clearance: 44.4 mL/min (A) (by C-G formula based on SCr of 1.44 mg/dL (H)).  Liver Function Tests: No results for input(s): AST, ALT, ALKPHOS, BILITOT, PROT, ALBUMIN in the last 168 hours.  CBG: Recent Labs  Lab 04/23/19 2257 04/23/19 2355 04/24/19 0101 04/24/19 0847 04/24/19 1156  GLUCAP 98 87 111* 183* 191*     Recent Results (from the past 240 hour(s))  SARS CORONAVIRUS 2 (TAT 6-24 HRS) Nasopharyngeal Nasopharyngeal Swab     Status: None   Collection Time: 04/23/19  5:55 PM   Specimen: Nasopharyngeal Swab  Result Value Ref Range Status   SARS Coronavirus 2 NEGATIVE NEGATIVE Final    Comment: (NOTE) SARS-CoV-2 target nucleic acids are NOT DETECTED. The SARS-CoV-2 RNA is generally detectable in upper and lower respiratory specimens during the acute  phase of infection. Negative results do not preclude SARS-CoV-2 infection, do not rule out co-infections with other pathogens, and should not be used as the sole basis for treatment or other patient management decisions. Negative results must be combined with clinical observations, patient history, and epidemiological information. The expected result is Negative. Fact Sheet for Patients: SugarRoll.be Fact Sheet for Healthcare Providers: https://www.woods-mathews.com/ This test is not yet approved or cleared by the [place] FDA and  has been authorized for detection and/or diagnosis of SARS-CoV-2 by FDA under an Emergency Use Authorization (EUA). This EUA will remain  in effect (meaning this test can be used) for the duration of the COVID-19 declaration under Section 56 4(b)(1) of the Act, 21 U.S.C. section 360bbb-3(b)(1), unless the authorization is terminated or revoked sooner. Performed at [facility], [place] [address] [place], [place] [address]  Radiology Studies: No results found.      Scheduled Meds: . aspirin  81 mg Oral Daily  . atorvastatin  20 mg Oral QHS  . B-complex with vitamin C  1 tablet Oral Daily  . heparin  5,000 Units Subcutaneous Q8H  . insulin aspart  0-5 Units Subcutaneous QHS  . insulin aspart  0-9 Units Subcutaneous TID WC  . insulin aspart  3 Units Subcutaneous TID WC  . insulin glargine  10 Units Subcutaneous Q24H  . sodium zirconium cyclosilicate  10 g Oral Daily   Continuous Infusions:   LOS: 1 day    Time spent: 25 minutes    Arriah Wadle, MD [facility]   To contact the attending provider between 7A-7P or the covering provider during after hours 7P-7A, please log into the web site [email or web address] and access using universal [place] password for that web site. If you do not have the password, please call the hospital operator.  04/24/2019, 2:11 PM

## 2019-04-24 NOTE — Telephone Encounter (Signed)
Call attempted to schedule for Monday. No answer, no vm.

## 2019-04-24 NOTE — Plan of Care (Signed)
  Problem: Education: Goal: Knowledge of General Education information will improve Description: Including pain rating scale, medication(s)/side effects and non-pharmacologic comfort measures Outcome: Progressing   Problem: Health Behavior/Discharge Planning: Goal: Ability to manage health-related needs will improve Outcome: Progressing   Problem: Clinical Measurements: Goal: Ability to maintain clinical measurements within normal limits will improve Outcome: Not Progressing Note: BUN is most recently elevated at 76. Will continue to monitor renal function labs for the remainder of the shift. Wenda Low [facility]

## 2019-04-24 NOTE — ED Notes (Signed)
Ready bed @ 0827, Patient going to room 248

## 2019-04-24 NOTE — Progress Notes (Addendum)
Inpatient Diabetes Program Recommendations  AACE/ADA: New Consensus Statement on Inpatient Glycemic Control (2015)  Target Ranges:  Prepandial:   less than 140 mg/dL      Peak postprandial:   less than 180 mg/dL (1-2 hours)      Critically ill patients:  140 - 180 mg/dL   Results for LINVILLE, DECAROLIS (MRN [id number]) as of 04/24/2019 06:53  Ref. Range 04/23/2019 11:08 04/23/2019 14:07 04/23/2019 15:41 04/23/2019 16:58 04/23/2019 18:09 04/23/2019 19:40 04/23/2019 20:53 04/23/2019 22:05 04/23/2019 22:57 04/23/2019 23:55 04/24/2019 01:01  Glucose-Capillary Latest Ref Range: 70 - 99 mg/dL 525 (HH) 471 (H) 494 (H) 324 (H) 258 (H) 167 (H) 114 (H) 110 (H) 98 87 111 (H)   Results for KREGG, CIHLAR (MRN [id number]) as of 04/24/2019 06:53  Ref. Range 04/24/2019 06:00  Glucose Latest Ref Range: 70 - 99 mg/dL 134 (H)   To ED with Weakness/ Hypotension/ Hyperglycemia  History: DM, CHF, CKD, Polysubstance abuse, CVA   Home DM Meds: Lantus 20 units QHS         Novolog 5 units TID       Farxiga 10 mg Daily (not taking the [place])   Current Orders: Lantus 10 units Daily      Novolog Sensitive Correction Scale/ SSI (0-9 units) TID AC + HS      Novolog 3 units TID with meals      CBG 525 on arrival to the ED. Given 1L NS IVF bolus in the ED. Novolog 10 units X 1 dose ordered.     Pt got IV Insulin Drip from 4pm until 1am last PM. Given 10 units Lantus at 11pm for transition to SQ. Now getting Lantus, Novolog SSI, and Novolog Meal Coverage this AM.  Endocrinologist: Malissa Hippo, NP with [facility] seen in office 03/15/2019--Was given the following instructions: Continue Novolog 5 units with meals Increase Lantus to 25 units QAM Start Trulicity 7.82 mg Qweek--Bydureon was substituted for insurance reasons  Lab glucose 134 mg/dl this AM.    Addendum 12pm--Met w/ pt at bedside.  Pt confirmed home doses of Insulin.  Told me that Malissa Hippo, NP at [facility] just started him  on the [facility] but that he hasn't taken the 1st dose yet.  Has access to all meds and supplies at home.  Has CBG meter at home.  Did not have any Diabetes questions for me at this time.  Strongly encouraged consistent follow up with the provider [facility] after d/c given A1c in January was 11%.       --Will follow patient during hospitalization--  Wyn Quaker RN, MSN, CDE Diabetes Coordinator Inpatient Glycemic Control Team Team Pager: [phone] (8a-5p)

## 2019-04-24 NOTE — ED Notes (Signed)
ALL CODE DOCUMENTATION DONE BETWEEN 0511 on 04/24/19 to 0625 on 04/24/19 IS INCORRECT DOCUMENTATION DONE ON WRONG PT

## 2019-04-24 NOTE — Telephone Encounter (Signed)
Spoke f-t-f with Shawn Faith, PA who reported that pt was aware of appt Monday at 0930 with Laurann [place], NP.   Will still need TCM call post discharge.

## 2019-04-25 ENCOUNTER — Ambulatory Visit: Payer: Medicaid Other | Admitting: Family

## 2019-04-25 DIAGNOSIS — F1910 Other psychoactive substance abuse, uncomplicated: Secondary | ICD-10-CM

## 2019-04-25 DIAGNOSIS — E43 Unspecified severe protein-calorie malnutrition: Secondary | ICD-10-CM

## 2019-04-25 DIAGNOSIS — I10 Essential (primary) hypertension: Secondary | ICD-10-CM

## 2019-04-25 DIAGNOSIS — E162 Hypoglycemia, unspecified: Secondary | ICD-10-CM

## 2019-04-25 LAB — GLUCOSE, CAPILLARY
Glucose-Capillary: 119 mg/dL — ABNORMAL HIGH (ref 70–99)
Glucose-Capillary: 171 mg/dL — ABNORMAL HIGH (ref 70–99)
Glucose-Capillary: 252 mg/dL — ABNORMAL HIGH (ref 70–99)
Glucose-Capillary: 258 mg/dL — ABNORMAL HIGH (ref 70–99)

## 2019-04-25 LAB — BASIC METABOLIC PANEL
Anion gap: 7 (ref 5–15)
BUN: 58 mg/dL — ABNORMAL HIGH (ref 8–23)
CO2: 28 mmol/L (ref 22–32)
Calcium: 9.4 mg/dL (ref 8.9–10.3)
Chloride: 99 mmol/L (ref 98–111)
Creatinine, Ser: 1.04 mg/dL (ref 0.61–1.24)
GFR calc Af Amer: >60 mL/min (ref >60)
GFR calc non Af Amer: >60 mL/min (ref >60)
Glucose, Bld: 104 mg/dL — ABNORMAL HIGH (ref 70–99)
Potassium: 5.1 mmol/L (ref 3.5–5.1)
Sodium: 134 mmol/L — ABNORMAL LOW (ref 135–145)

## 2019-04-25 MED ORDER — ADULT MULTIVITAMIN W/MINERALS CH
1.0000 | ORAL_TABLET | Freq: Every day | ORAL | Status: DC
  Administered 2019-04-26: 1 via ORAL
  Filled 2019-04-25: qty 1

## 2019-04-25 MED ORDER — ENSURE ENLIVE PO LIQD
237.0000 mL | Freq: Three times a day (TID) | ORAL | Status: DC
  Administered 2019-04-25 – 2019-04-26 (×3): 237 mL via ORAL

## 2019-04-25 MED ORDER — SODIUM POLYSTYRENE SULFONATE 15 GM/60ML PO SUSP
15.0000 g | Freq: Once | ORAL | Status: AC
  Administered 2019-04-25: 15 g via ORAL
  Filled 2019-04-25: qty 60

## 2019-04-25 NOTE — Progress Notes (Signed)
Initial Nutrition Assessment  DOCUMENTATION CODES:   Severe malnutrition in context of chronic illness  INTERVENTION:   Ensure Enlive po TID, each supplement provides 350 kcal and 20 grams of protein  MVI daily   Liberalize diet   NUTRITION DIAGNOSIS:   Severe Malnutrition related to chronic illness(CHF, DM, etoh abuse) as evidenced by severe fat depletion, severe muscle depletion.  GOAL:   Patient will meet greater than or equal to 90% of their needs  MONITOR:   PO intake, Supplement acceptance, Labs, Weight trends, Skin, I & O's  REASON FOR ASSESSMENT:   Consult Assessment of nutrition requirement/status  ASSESSMENT:   64 y.o. male past medical history of nonischemic cardiomyopathy with an EF of 30%, uncontrolled diabetes mellitus type 2, diabetic nephropathy, chronic kidney disease stage IIIb, etoh and substance abuse and CVA who presents in with weakness and diarrhea accompanied by hypotension that started after COVID 19 vaccine   Met with pt in room today. Pt is unhappy today, he was hoping to be able to go home and found out he is not. Pt reports fairly good appetite and oral intake at baseline. RD suspects pt with poor appetite and oral intake at baseline r/t etoh abuse. Pt reports that he does not really like the hospital food but he will eat it to avoid "starving to death". Pt documented to be eating only sips and bites yesterday but reports eating 90% of his lunch today. Pt does report that he enjoys chocolate Ensure; RD will order. RD will liberalize the heart healthy portion of pt's diet as this is restrictive of protein. Pt reports his UBW is ~140lbs. Per chart, pt's weight is documented anywhere from 125lbs-172lbs. It does appear that pt has lost some weight over the past several months. Pt weighed 148lbs in January. Per chart, this would be a 20lb(14%) weight loss in 3  months if the weights are correct; this would be significant.   Medications reviewed and  include: aspirin, B-complex with C, heparin, insulin, MVI  Labs reviewed: Na 134(L), BUN 58(H) cbgs- 119, 171 x 24 hrs AIC 11.0(H)- 1/14  NUTRITION - FOCUSED PHYSICAL EXAM:    Most Recent Value  Orbital Region  Moderate depletion  Upper Arm Region  Severe depletion  Thoracic and Lumbar Region  Severe depletion  Buccal Region  Moderate depletion  Temple Region  Severe depletion  Clavicle Bone Region  Severe depletion  Clavicle and Acromion Bone Region  Severe depletion  Scapular Bone Region  Severe depletion  Dorsal Hand  Severe depletion  Patellar Region  Severe depletion  Anterior Thigh Region  Severe depletion  Posterior Calf Region  Severe depletion  Edema (RD Assessment)  None  Hair  Reviewed  Eyes  Reviewed  Mouth  Reviewed  Skin  Reviewed  Nails  Reviewed     Diet Order:   Diet Order            Diet Carb Modified Fluid consistency: Thin; Room service appropriate? Yes  Diet effective now             EDUCATION NEEDS:   Education needs have been addressed  Skin:  Skin Assessment: Reviewed RN Assessment  Last BM:  4/19  Height:   Ht Readings from Last 1 Encounters:  04/24/19 _0  (1.803 m)    Weight:   Wt Readings from Last 1 Encounters:  04/25/19 57 kg    Ideal Body Weight:  78 kg  BMI:  Body mass index is 17.52 kg/m.  Estimated Nutritional Needs:   Kcal:  1800-2100kcal/day  Protein:  90-105g/day  Fluid:  1.7L/day  Koleen Distance MS, RD, LDN Please refer to [facility] for RD and/or RD on-call/weekend/after hours pager

## 2019-04-25 NOTE — Telephone Encounter (Signed)
Patient currently admitted at this time. 

## 2019-04-25 NOTE — Progress Notes (Addendum)
[facility] PROGRESS NOTE    Progress Note  Shawn [name]  WLN:[id number] DOB: 28-Mar-1955 DOA: 04/23/2019 PCP: [place]     Brief Narrative:   Shawn [name] is an 64 y.o. 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, chronic kidney disease stage IIIb and a CVA who presents in with weakness and diarrhea accompanied by hypotension that started after: Vaccine vaccination, he was found in [place] on admission.  Assessment/Plan:   Hyperosmolar nonketotic state/diabetes mellitus type 2: On admission requiring insulin drip now has been transitioned to long-acting insulin plus sliding scale insulin. Blood glucose fairly controlled continue long-acting insulin plus sliding scale.  Hypovolemic hyponatremia: In the setting of hyperglycemia and hypovolemia he was started on IV fluids and his sodium returned to baseline. Continue current home regimen.  Hyperkalemia: In the setting of Aldactone Entresto and potassium supplements. His Aldactone was recently increased as an outpatient. His potassium is still ranging around 5 we will give him a dose of Kayexalate. Recheck a basic metabolic panel in the morning.  NICM (nonischemic cardiomyopathy) ([name])/Chronic systolic heart failure: Hypovolemi/c admission due to overdiuresis and diarrhea. His blood pressure is fairly controlled, His beta-blocker was resumed. Continue to hold Entresto and Aldactone due to borderline potassium level.  Acute kidney injury superimposed on chronic kidney disease stage IIIb: Likely prerenal azotemia in the setting of Entresto diuretic and Aldactone use. He was started on IV fluid and his creatinine has returned to baseline.  Diarrhea: Likely in adverse reaction of COVID-19 vaccines he has remained afebrile, no further diarrhea this has resolved.  Generalized weakness: PT evaluation is  pending.  History of alcohol abuse:  Severe protein caloric malnutrition: Noted.  DVT prophylaxis: heparin Family Communication:none Status is: Inpatient  Remains inpatient appropriate because:Ongoing diagnostic testing needed not appropriate for outpatient work up   Dispo: The patient is from: Home              Anticipated d/c is to: Home              Anticipated d/c date is: 1 day              Patient currently is not medically stable to d/c.  Code Status:     Code Status Orders  (From admission, onward)           Start     Ordered   04/23/19 1719  Full code  Continuous     04/23/19 1721           Code Status History     Date Active Date Inactive Code Status Order ID Comments User Context   01/18/2019 1715 01/20/2019 2033 Full Code [id number]  Fritzi Mandes, MD Inpatient   09/22/2018 1946 09/23/2018 1917 Full Code [id number]  Vaughan Basta, MD Inpatient   08/08/2018 0047 08/09/2018 1841 Full Code [id number]  Fritzi Mandes, MD Inpatient   03/21/2018 0602 03/24/2018 1625 Full Code [id number]  Mansy, Arvella Merles, MD ED   12/15/2017 2358 12/17/2017 1933 Full Code [id number]  Lance Coon, MD Inpatient   04/27/2017 0531 04/29/2017 2118 Full Code [id number]  Harrie Foreman, MD Inpatient   Advance Care Planning Activity      Advance Directive Documentation      Most Recent Value  Type of Advance Directive  Healthcare Power of Attorney, Living will  Pre-existing out of facility DNR order (yellow form or pink MOST form)  --  "  MOST" Form in Place?  --         IV Access:   Peripheral IV   Procedures and diagnostic studies:   No results found.   Medical Consultants:   None.  Anti-Infectives:   None  Subjective:    Shawn [name] patient was ready to go home in a good mood but accepted the decision to monitor for an additional day he has no further complaints except for that he does not like the heparin shots.  Objective:    Vitals:   04/24/19  1704 04/24/19 1926 04/25/19 0417 04/25/19 0736  BP: 131/88 138/86 129/79 120/82  Pulse: 65 74 66 73  Resp: 18 19 19 17   Temp: 98.4 F (36.9 C) 98.4 F (36.9 C) 98.3 F (36.8 C) 98.4 F (36.9 C)  TempSrc:  Oral Oral Oral  SpO2: 100% 100% 100% 100%  Weight:   57 kg   Height:       SpO2: 100 %   Intake/Output Summary (Last 24 hours) at 04/25/2019 0942 Last data filed at 04/25/2019 0424 Gross per 24 hour  Intake 720 ml  Output 1925 ml  Net -1205 ml   Filed Weights   04/23/19 1104 04/24/19 0941 04/25/19 0417  Weight: 59 kg 59 kg 57 kg    Exam: General exam: In no acute distress, cachectic Respiratory system: Good air movement and clear to auscultation. Cardiovascular system: S1 & S2 heard, RRR.  Gastrointestinal system: Abdomen is nondistended, soft and nontender.  Central nervous system: Alert and oriented. No focal neurological deficits. Extremities: No pedal edema. Skin: Hyperpigmented lower extremities.  Data Reviewed:    Labs: Basic Metabolic Panel: Recent Labs  Lab 04/23/19 1452 04/23/19 1452 04/23/19 1538 04/23/19 1538 04/23/19 1755 04/23/19 1755 04/24/19 0600 04/25/19 0556  NA 128*  --  126*  --  131*  --  134* 134*  K 7.1*   < > 6.1*   < > 5.0   < > 5.2* 5.1  CL 92*  --  91*  --  92*  --  99 99  CO2 26  --  23  --  24  --  26 28  GLUCOSE 536*  --  526*  --  344*  --  134* 104*  BUN 80*  --  80*  --  80*  --  76* 58*  CREATININE 2.04*  --  2.10*  --  1.92*  --  1.44* 1.04  CALCIUM 9.0  --  9.0  --  9.7  --  9.2 9.4   < > = values in this interval not displayed.   GFR Estimated Creatinine Clearance: 59.4 mL/min (by C-G formula based on SCr of 1.04 mg/dL). Liver Function Tests: No results for input(s): AST, ALT, ALKPHOS, BILITOT, PROT, ALBUMIN in the last 168 hours. No results for input(s): LIPASE, AMYLASE in the last 168 hours. No results for input(s): AMMONIA in the last 168 hours. Coagulation profile No results for input(s): INR, PROTIME in the  last 168 hours. COVID-19 Labs  No results for input(s): DDIMER, FERRITIN, LDH, CRP in the last 72 hours.  Lab Results  Component Value Date   SARSCOV2NAA NEGATIVE 04/23/2019   [place] Not Detected 02/07/2019   [place] NEGATIVE 01/18/2019   [place] NEGATIVE 09/22/2018    CBC: Recent Labs  Lab 04/23/19 1108  WBC 5.2  HGB 11.2*  HCT 35.1*  MCV 81.8  PLT 406*   Cardiac Enzymes: No results for input(s): CKTOTAL, CKMB, CKMBINDEX, TROPONINI in  the last 168 hours. BNP (last 3 results) No results for input(s): PROBNP in the last 8760 hours. CBG: Recent Labs  Lab 04/24/19 0847 04/24/19 1156 04/24/19 1703 04/24/19 2121 04/25/19 0738  GLUCAP 183* 191* 167* 96 119*   D-Dimer: No results for input(s): DDIMER in the last 72 hours. Hgb A1c: No results for input(s): HGBA1C in the last 72 hours. Lipid Profile: No results for input(s): CHOL, HDL, LDLCALC, TRIG, CHOLHDL, LDLDIRECT in the last 72 hours. Thyroid function studies: No results for input(s): TSH, T4TOTAL, T3FREE, THYROIDAB in the last 72 hours.  Invalid input(s): FREET3 Anemia work up: No results for input(s): VITAMINB12, FOLATE, FERRITIN, TIBC, IRON, RETICCTPCT in the last 72 hours. Sepsis Labs: Recent Labs  Lab 04/23/19 1108  WBC 5.2   Microbiology Recent Results (from the past 240 hour(s))  SARS CORONAVIRUS 2 (TAT 6-24 HRS) Nasopharyngeal Nasopharyngeal Swab     Status: None   Collection Time: 04/23/19  5:55 PM   Specimen: Nasopharyngeal Swab  Result Value Ref Range Status   SARS Coronavirus 2 NEGATIVE NEGATIVE Final    Comment: (NOTE) SARS-CoV-2 target nucleic acids are NOT DETECTED. The SARS-CoV-2 RNA is generally detectable in upper and lower respiratory specimens during the acute phase of infection. Negative results do not preclude SARS-CoV-2 infection, do not rule out co-infections with other pathogens, and should not be used as the sole basis for treatment or other patient management  decisions. Negative results must be combined with clinical observations, patient history, and epidemiological information. The expected result is Negative. Fact Sheet for Patients: SugarRoll.be Fact Sheet for Healthcare Providers: https://www.woods-mathews.com/ This test is not yet approved or cleared by the [place] FDA and  has been authorized for detection and/or diagnosis of SARS-CoV-2 by FDA under an Emergency Use Authorization (EUA). This EUA will remain  in effect (meaning this test can be used) for the duration of the COVID-19 declaration under Section 56 4(b)(1) of the Act, 21 U.S.C. section 360bbb-3(b)(1), unless the authorization is terminated or revoked sooner. Performed at [facility], [place] [address], [place], [place] [address]      Medications:    aspirin  81 mg Oral Daily   atorvastatin  20 mg Oral QHS   B-complex with vitamin C  1 tablet Oral Daily   carvedilol  3.125 mg Oral BID WC   heparin  5,000 Units Subcutaneous Q8H   insulin aspart  0-5 Units Subcutaneous QHS   insulin aspart  0-9 Units Subcutaneous TID WC   insulin aspart  3 Units Subcutaneous TID WC   insulin glargine  10 Units Subcutaneous Q24H   sodium zirconium cyclosilicate  10 g Oral Daily   Continuous Infusions:     LOS: 2 days   Charlynne Cousins  [facility]  04/25/2019, 9:42 AM

## 2019-04-25 NOTE — Progress Notes (Signed)
Medications administered by [place] Nurse were supervised by this RN Courtni Balash A Khamarion Bjelland

## 2019-04-26 LAB — BASIC METABOLIC PANEL
Anion gap: 10 (ref 5–15)
BUN: 48 mg/dL — ABNORMAL HIGH (ref 8–23)
CO2: 27 mmol/L (ref 22–32)
Calcium: 9.1 mg/dL (ref 8.9–10.3)
Chloride: 97 mmol/L — ABNORMAL LOW (ref 98–111)
Creatinine, Ser: 0.96 mg/dL (ref 0.61–1.24)
GFR calc Af Amer: >60 mL/min (ref >60)
GFR calc non Af Amer: >60 mL/min (ref >60)
Glucose, Bld: 257 mg/dL — ABNORMAL HIGH (ref 70–99)
Potassium: 4.5 mmol/L (ref 3.5–5.1)
Sodium: 134 mmol/L — ABNORMAL LOW (ref 135–145)

## 2019-04-26 LAB — GLUCOSE, CAPILLARY
Glucose-Capillary: 304 mg/dL — ABNORMAL HIGH (ref 70–99)
Glucose-Capillary: 332 mg/dL — ABNORMAL HIGH (ref 70–99)

## 2019-04-26 MED ORDER — SPIRONOLACTONE 25 MG PO TABS: 12.5000 mg | ORAL_TABLET | Freq: Every day | ORAL | 3 refills | Status: DC

## 2019-04-26 NOTE — Plan of Care (Signed)
  Problem: Education: Goal: Knowledge of General Education information will improve Description: Including pain rating scale, medication(s)/side effects and non-pharmacologic comfort measures Outcome: Progressing Note: Patient wants to go home ASAP. Awaiting physician to see patient this AM. Will continue to monitor for possible discharge. Wenda Low [facility]

## 2019-04-26 NOTE — Progress Notes (Signed)
Inpatient Diabetes Program Recommendations  AACE/ADA: New Consensus Statement on Inpatient Glycemic Control (2015)  Target Ranges:  Prepandial:   less than 140 mg/dL      Peak postprandial:   less than 180 mg/dL (1-2 hours)      Critically ill patients:  140 - 180 mg/dL   Lab Results  Component Value Date   GLUCAP 304 (H) 04/26/2019   HGBA1C 11.0 (H) 01/18/2019    Review of Glycemic Control Results for Shawn [name], Shawn [name] (MRN [id number]) as of 04/26/2019 09:39  Ref. Range 04/25/2019 07:38 04/25/2019 11:41 04/25/2019 16:27 04/25/2019 21:26 04/26/2019 08:13  Glucose-Capillary Latest Ref Range: 70 - 99 mg/dL 119 (H) 171 (H) 252 (H) 258 (H) 304 (H)   History: DM, CHF, CKD, Polysubstance abuse, CVA   Home DM Meds: Lantus 20 units QHS                             Novolog 5 units TID                             Farxiga 10 mg Daily (not taking the [place])   Current Orders: Lantus 10 units Daily                            Novolog Sensitive Correction Scale/ SSI (0-9 units) TID AC + HS                            Novolog 3 units TID with meals  Inpatient Diabetes Program Recommendations:    With addition of nutritional supplements, CBGs elevated. -Increase Novolog meal coverage to 5 units tid if eats 50% -Increase Lantus to 15 units qd  Thank you, Nani Gasser. Nathan Stallworth, RN, MSN, CDE  Diabetes Coordinator Inpatient Glycemic Control Team Team Pager [phone] (8am-5pm) 04/26/2019 9:39 AM

## 2019-04-26 NOTE — Discharge Instructions (Signed)
Weakness Weakness is a lack of strength. You may feel weak all over your body (generalized), or you may feel weak in one specific part of your body (focal). Common causes of weakness include:  Infection and immune system disorders.  Physical exhaustion.  Internal bleeding or other blood loss that results in a lack of red blood cells (anemia).  Dehydration.  An imbalance in mineral (electrolyte) levels, such as potassium.  Heart disease, circulation problems, or stroke. Other causes include:  Some medicines or cancer treatment.  Stress, anxiety, or depression.  Nervous system disorders.  Thyroid disorders.  Loss of muscle strength because of age or inactivity.  Poor sleep quality or sleep disorders. The cause of your weakness may not be known. Some causes of weakness can be serious, so it is important to see your health care provider. Follow these instructions at home: Activity  Rest as needed.  Try to get enough sleep. Most adults need 7-8 hours of quality sleep each night. Talk to your health care provider about how much sleep you need each night.  Do exercises, such as arm curls and leg raises, for 30 minutes at least 2 days a week or as told by your health care provider. This helps build muscle strength.  Consider working with a physical therapist or trainer who can develop an exercise plan to help you gain muscle strength. General instructions   Take over-the-counter and prescription medicines only as told by your health care provider.  Eat a healthy, well-balanced diet. This includes: ? Proteins to build muscles, such as lean meats and fish. ? Fresh fruits and vegetables. ? Carbohydrates to boost energy, such as whole grains.  Drink enough fluid to keep your urine pale yellow.  Keep all follow-up visits as told by your health care provider. This is important. Contact a health care provider if your weakness:  Does not improve or gets worse.  Affects your  ability to think clearly.  Affects your ability to do your normal daily activities. Get help right away if you:  Develop sudden weakness, especially on one side of your face or body.  Have chest pain.  Have trouble breathing or shortness of breath.  Have problems with your vision.  Have trouble talking or swallowing.  Have trouble standing or walking.  Are light-headed or lose consciousness. Summary  Weakness is a lack of strength. You may feel weak all over your body or just in one specific part of your body.  Weakness can be caused by a variety of things. In some cases, the cause may be unknown.  Rest as needed, and try to get enough sleep. Most adults need 7-8 hours of quality sleep each night.  Eat a healthy, well-balanced diet. This information is not intended to replace advice given to you by your health care provider. Make sure you discuss any questions you have with your health care provider. Document Revised: 07/27/2017 Document Reviewed: 07/27/2017 Elsevier Patient Education  [place].

## 2019-04-26 NOTE — Discharge Summary (Signed)
Physician Discharge Summary  Shawn [name] QAS:[id number] DOB: Jul 12, 1955 DOA: 04/23/2019  [facility]P: [facility]  Admit date: 04/23/2019 Discharge date: 04/26/2019  Admitted From: Home Disposition:  Home  Recommendations for Outpatient Follow-up:  1. Follow up with PCP in 1-2 weeks 2. Please obtain BMP/CBC in one week   Home Health:No Equipment/Devices:None  Discharge Condition:Stable CODE STATUS:Full Diet recommendation: Heart Healthy   Brief/Interim Summary: 64 y.o. 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, chronic kidney disease stage IIIb and a CVA who presents in with weakness and diarrhea accompanied by hypotension that started after: Vaccine vaccination, he was found in [place] on admission  Discharge Diagnoses:  Active Problems:   NICM (nonischemic cardiomyopathy) ([name])   Polysubstance abuse ([place])   Alcohol abuse   Essential hypertension   Nonketotic hypoglycemia   Hyperosmolar non-ketotic state due to type 2 diabetes mellitus ([place])   Hyperglycemia due to type 2 diabetes mellitus ([name])   Hyperkalemia   Acute renal failure superimposed on stage 3b chronic kidney disease ([name])   Diarrhea   Protein-calorie malnutrition, severe ([name])  [place]/diabetes mellitus type 2: Started on IV insulin drip once his blood glucose is controlled he was transitioned to long-acting insulin plus sliding scale question likely due to noncompliance.  Hypovolemic hyponatremia: The setting of hyperglycemia and hypovolemia resolved with IV fluid hydration.  Hyperkalemia:  in the setting of Aldactone, Entresto and potassium supplement these were held his Aldactone was recently increased as an outpatient will go back to his previous dose he was given oral Kayexalate and his potassium returned to baseline.  Chronic systolic heart failure/nonischemic cardiomyopathy: Hypovolemic on admission  likely due to overdiuresis and diarrhea in the setting of hyperglycemia his antihypertensive medications were held these were resumed as an outpatient follow-up with cardiology as an outpatient.  Acute kidney injury superimposed on chronic kidney disease stage IIIb: Likely due to hyperglycemia his diuretics and antihypertensive medications were held this resolved with IV fluid hydration.  Diarrhea: Likely an adverse reaction of COVID-19 vaccine he has remained afebrile no further diarrhea in house.  Generalized weakness: Physical therapy evaluated the patient and recommended no further home health PT.  History of alcohol abuse: Counseled.  Discharge Instructions  Discharge Instructions    Diet - low sodium heart healthy   Complete by: As directed    Increase activity slowly   Complete by: As directed      Allergies as of 04/26/2019   No Known Allergies     Medication List    TAKE these medications   aspirin 81 MG chewable tablet Chew 1 tablet (81 mg total) by mouth daily.   atorvastatin 40 MG tablet Commonly known as: LIPITOR Take 0.5 tablets (20 mg total) by mouth at bedtime.   B-Complex-C Tabs Take 1 tablet by mouth daily.   baclofen 10 MG tablet Commonly known as: LIORESAL Take 10 mg by mouth 2 (two) times daily as needed for muscle spasms. Muscle spasm   carvedilol 25 MG tablet Commonly known as: COREG Take 0.5 tablets (12.5 mg total) by mouth 2 (two) times daily with a meal.   cetirizine 10 MG tablet Commonly known as: ZYRTEC Take 10 mg by mouth daily.   Farxiga 10 MG Tabs tablet Generic drug: dapagliflozin propanediol Take 10 mg by mouth daily before breakfast.   insulin aspart 100 UNIT/ML injection Commonly known as: novoLOG Inject 5 Units into the skin 3 (three) times daily  with meals.   insulin glargine 100 UNIT/ML injection Commonly known as: LANTUS Inject 20 Units into the skin at bedtime.   omeprazole 20 MG capsule Commonly known as:  PRILOSEC Take 20 mg by mouth daily.   polyethylene glycol 17 g packet Commonly known as: MIRALAX / GLYCOLAX Take 17 g by mouth daily.   potassium chloride SA 20 MEQ tablet Commonly known as: KLOR-CON Take 1 tablet (20 mEq total) by mouth daily.   sacubitril-valsartan 97-103 MG Commonly known as: ENTRESTO Take 1 tablet by mouth 2 (two) times daily.   spironolactone 25 MG tablet Commonly known as: ALDACTONE Take 1 tablet (25 mg total) by mouth at bedtime.   torsemide 20 MG tablet Commonly known as: DEMADEX Take 1 tablet (20 mg total) by mouth 2 (two) times daily.      Follow-up Information    Loel Dubonnet, NP Follow up on 04/30/2019.   Specialty: Cardiology Why: Appointment time 9:30 AM. Please arrive 20-25 minutes early to allow for screening, check-in, and rooming. This is at Dr. Donivan Scull office.  Contact information: [place] [address] [phone]          No Known Allergies  Consultations:  None   Procedures/Studies:  No results found.   Subjective: No complaints.  Discharge Exam: Vitals:   04/26/19 0326 04/26/19 0733  BP: 133/82 (!) 157/91  Pulse: 74 70  Resp: 18 18  Temp: 98.2 F (36.8 C) 98 F (36.7 C)  SpO2: 100% 100%   Vitals:   04/25/19 1519 04/25/19 1959 04/26/19 0326 04/26/19 0733  BP: 137/80 (!) 148/88 133/82 (!) 157/91  Pulse: 77 74 74 70  Resp: 18 (!) 21 18 18   Temp: 98 F (36.7 C) 98.6 F (37 C) 98.2 F (36.8 C) 98 F (36.7 C)  TempSrc: Oral Oral Oral Oral  SpO2: 100% 100% 100% 100%  Weight:   57.2 kg   Height:        General: Pt is alert, awake, not in acute distress Cardiovascular: RRR, S1/S2 +, no rubs, no gallops Respiratory: CTA bilaterally, no wheezing, no rhonchi Abdominal: Soft, NT, ND, bowel sounds + Extremities: no edema, no cyanosis    The results of significant diagnostics from this hospitalization (including imaging, microbiology, ancillary and laboratory) are listed  below for reference.     Microbiology: Recent Results (from the past 240 hour(s))  SARS CORONAVIRUS 2 (TAT 6-24 HRS) Nasopharyngeal Nasopharyngeal Swab     Status: None   Collection Time: 04/23/19  5:55 PM   Specimen: Nasopharyngeal Swab  Result Value Ref Range Status   SARS Coronavirus 2 NEGATIVE NEGATIVE Final    Comment: (NOTE) SARS-CoV-2 target nucleic acids are NOT DETECTED. The SARS-CoV-2 RNA is generally detectable in upper and lower respiratory specimens during the acute phase of infection. Negative results do not preclude SARS-CoV-2 infection, do not rule out co-infections with other pathogens, and should not be used as the sole basis for treatment or other patient management decisions. Negative results must be combined with clinical observations, patient history, and epidemiological information. The expected result is Negative. Fact Sheet for Patients: SugarRoll.be Fact Sheet for Healthcare Providers: https://www.woods-mathews.com/ This test is not yet approved or cleared by the [place] FDA and  has been authorized for detection and/or diagnosis of SARS-CoV-2 by FDA under an Emergency Use Authorization (EUA). This EUA will remain  in effect (meaning this test can be used) for the duration of the COVID-19 declaration under Section 56  4(b)(1) of the Act, 21 U.S.C. section 360bbb-3(b)(1), unless the authorization is terminated or revoked sooner. Performed at [facility], [place] [address], [place], [place] [address]      Labs: BNP (last 3 results) Recent Labs    01/18/19 1147 01/18/19 1734 03/07/19 0956  BNP 221.0* 283.0* 174.9*   Basic Metabolic Panel: Recent Labs  Lab 04/23/19 1538 04/23/19 1755 04/24/19 0600 04/25/19 0556 04/26/19 0416  NA 126* 131* 134* 134* 134*  K 6.1* 5.0 5.2* 5.1 4.5  CL 91* 92* 99 99 97*  CO2 23 24 26 28 27   GLUCOSE 526* 344* 134* 104* 257*  BUN 80* 80* 76* 58* 48*  CREATININE  2.10* 1.92* 1.44* 1.04 0.96  CALCIUM 9.0 9.7 9.2 9.4 9.1   Liver Function Tests: No results for input(s): AST, ALT, ALKPHOS, BILITOT, PROT, ALBUMIN in the last 168 hours. No results for input(s): LIPASE, AMYLASE in the last 168 hours. No results for input(s): AMMONIA in the last 168 hours. CBC: Recent Labs  Lab 04/23/19 1108  WBC 5.2  HGB 11.2*  HCT 35.1*  MCV 81.8  PLT 406*   Cardiac Enzymes: No results for input(s): CKTOTAL, CKMB, CKMBINDEX, TROPONINI in the last 168 hours. BNP: Invalid input(s): POCBNP CBG: Recent Labs  Lab 04/25/19 0738 04/25/19 1141 04/25/19 1627 04/25/19 2126 04/26/19 0813  GLUCAP 119* 171* 252* 258* 304*   D-Dimer No results for input(s): DDIMER in the last 72 hours. Hgb A1c No results for input(s): HGBA1C in the last 72 hours. Lipid Profile No results for input(s): CHOL, HDL, LDLCALC, TRIG, CHOLHDL, LDLDIRECT in the last 72 hours. Thyroid function studies No results for input(s): TSH, T4TOTAL, T3FREE, THYROIDAB in the last 72 hours.  Invalid input(s): FREET3 Anemia work up No results for input(s): VITAMINB12, FOLATE, FERRITIN, TIBC, IRON, RETICCTPCT in the last 72 hours. Urinalysis    Component Value Date/Time   COLORURINE YELLOW (A) 04/23/2019 1106   APPEARANCEUR CLOUDY (A) 04/23/2019 1106   LABSPEC 1.008 04/23/2019 1106   PHURINE 5.0 04/23/2019 1106   GLUCOSEU >=500 (A) 04/23/2019 1106   HGBUR NEGATIVE 04/23/2019 1106   BILIRUBINUR NEGATIVE 04/23/2019 1106   [place] 04/23/2019 1106   PROTEINUR 30 (A) 04/23/2019 1106   NITRITE NEGATIVE 04/23/2019 1106   LEUKOCYTESUR NEGATIVE 04/23/2019 1106   Sepsis Labs Invalid input(s): PROCALCITONIN,  WBC,  LACTICIDVEN Microbiology Recent Results (from the past 240 hour(s))  SARS CORONAVIRUS 2 (TAT 6-24 HRS) Nasopharyngeal Nasopharyngeal Swab     Status: None   Collection Time: 04/23/19  5:55 PM   Specimen: Nasopharyngeal Swab  Result Value Ref Range Status   SARS Coronavirus 2  NEGATIVE NEGATIVE Final    Comment: (NOTE) SARS-CoV-2 target nucleic acids are NOT DETECTED. The SARS-CoV-2 RNA is generally detectable in upper and lower respiratory specimens during the acute phase of infection. Negative results do not preclude SARS-CoV-2 infection, do not rule out co-infections with other pathogens, and should not be used as the sole basis for treatment or other patient management decisions. Negative results must be combined with clinical observations, patient history, and epidemiological information. The expected result is Negative. Fact Sheet for Patients: SugarRoll.be Fact Sheet for Healthcare Providers: https://www.woods-mathews.com/ This test is not yet approved or cleared by the [place] FDA and  has been authorized for detection and/or diagnosis of SARS-CoV-2 by FDA under an Emergency Use Authorization (EUA). This EUA will remain  in effect (meaning this test can be used) for the duration of the COVID-19 declaration under  Section 56 4(b)(1) of the Act, 21 U.S.C. section 360bbb-3(b)(1), unless the authorization is terminated or revoked sooner. Performed at [facility], [place] [address], [place], [place] [address]      Time coordinating discharge: Over 40 minutes  SIGNED:   Charlynne Cousins, MD  [facility] 04/26/2019, 11:23 AM Pager   If 7PM-7AM, please contact night-coverage [email or web address] Password [email or web address]

## 2019-04-26 NOTE — Plan of Care (Signed)
  Problem: Education: Goal: Knowledge of General Education information will improve Description: Including pain rating scale, medication(s)/side effects and non-pharmacologic comfort measures Outcome: Progressing   Problem: Clinical Measurements: Goal: Diagnostic test results will improve Outcome: Progressing   

## 2019-04-26 NOTE — TOC Progression Note (Addendum)
Transition of Care [facility]) - Progression Note    Patient Details  Name: TOBBY FAWCETT MRN: [id number] Date of Birth: [birth year]  Transition of Care [facility]) CM/SW [place], RN Phone Number: 04/26/2019, 8:59 AM  Clinical Narrative:      Referred patient to Heart Failure Clinic.  He has appointment 5/11.  No further TOC needs at this time, please re-consult for new needs.         Expected Discharge Plan and Services                                                 Social Determinants of Health (SDOH) Interventions    Readmission Risk Interventions Readmission Risk Prevention Plan 12/17/2017  Transportation Screening Complete  PCP or Specialist Appt within 3-5 Days Complete  Home Care Screening Complete  Social Work Consult for [place] Planning/Counseling Patient refused  Palliative Care Screening Patient refused  Medication Review Press photographer) Complete  Some recent data might be hidden

## 2019-04-26 NOTE — Telephone Encounter (Signed)
Patient contacted regarding discharge from [facility] on 4/22.  Patient understands to follow up with provider Laurann [place], NP on 4/26 at 0930 at [facility]. Patient understands discharge instructions? yes Patient understands medications and regiment? yes Patient understands to bring all medications to this visit? yes

## 2019-04-27 IMAGING — DX DG CHEST 1V
1 series · 1 of 1 positions shown · non-contrast
Comparison: 04/27/2017

CLINICAL DATA: Weakness

EXAM:
CHEST  1 VIEW

[chest ap]
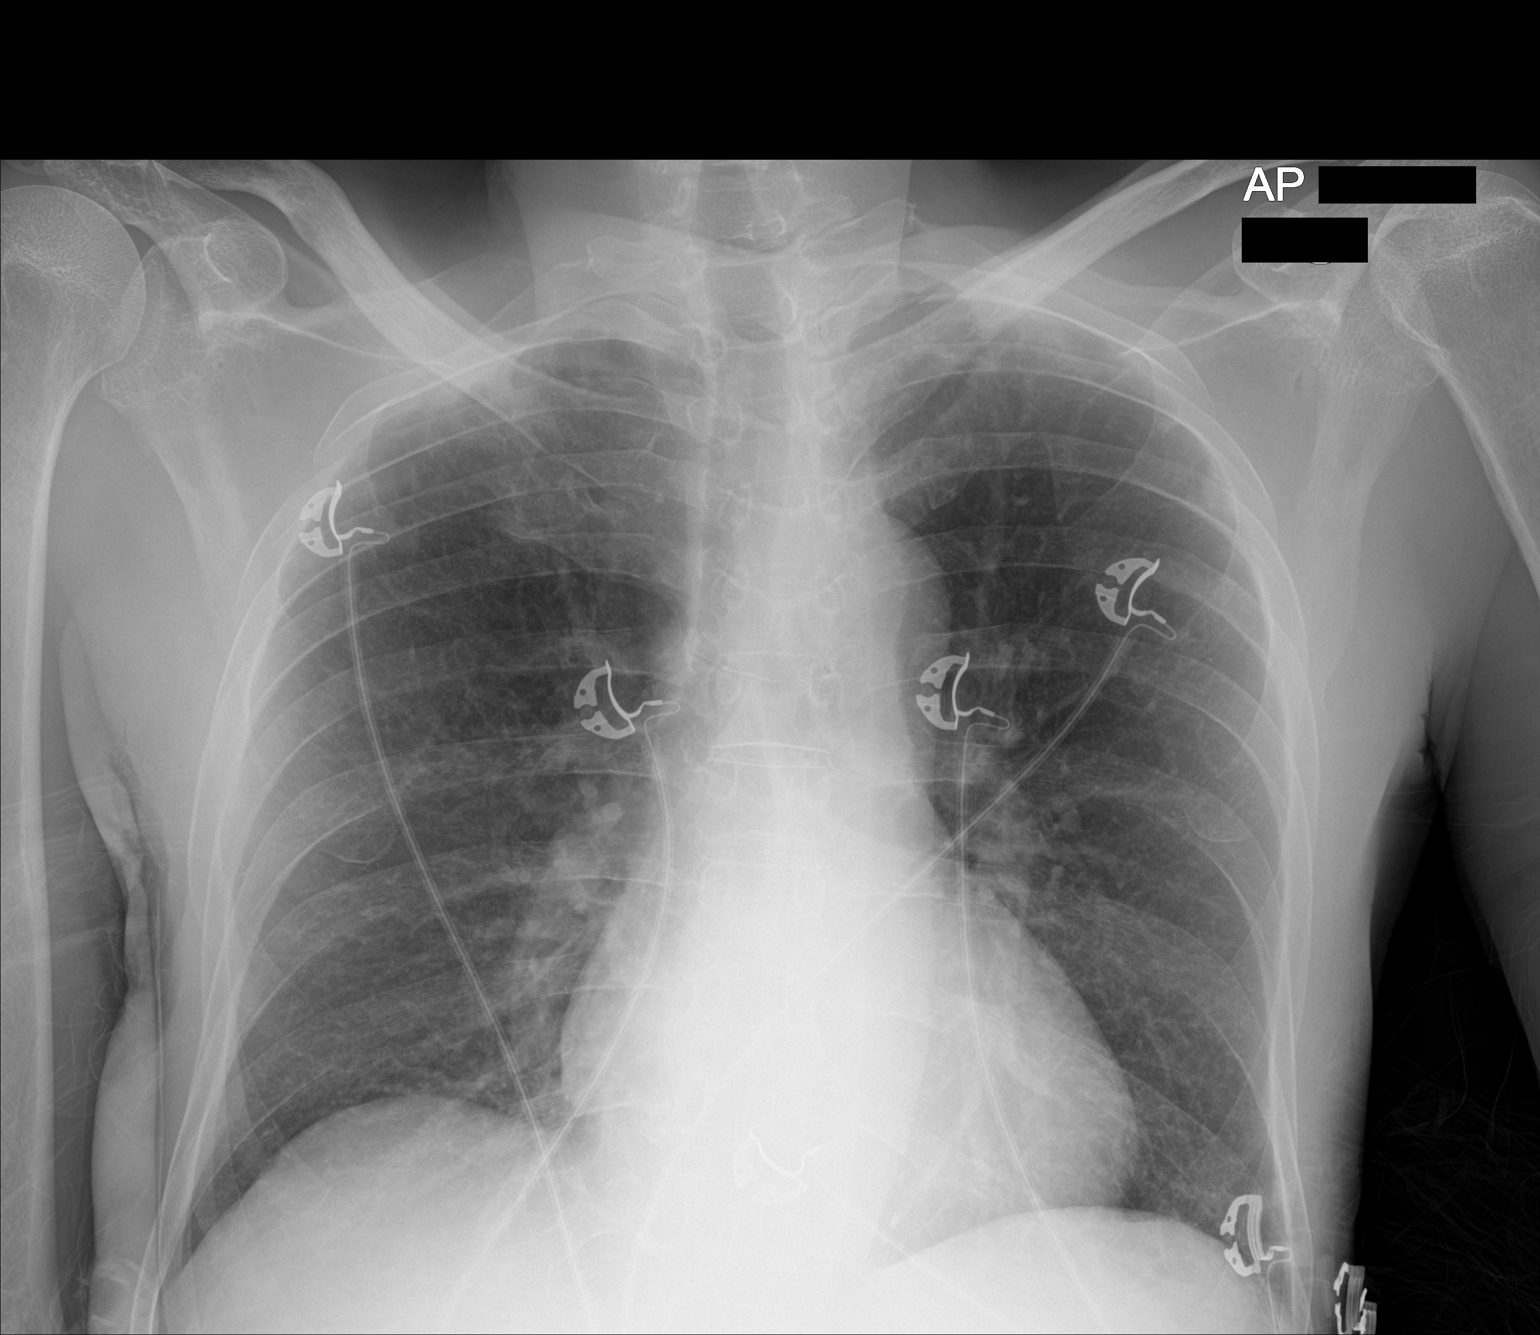

[1 of 1 positions shown; findings below may reference images not displayed]

FINDINGS: The heart size and mediastinal contours are within normal limits.
Both lungs are clear. The visualized skeletal structures are
unremarkable.
IMPRESSION: No active disease.

## 2019-04-30 ENCOUNTER — Other Ambulatory Visit: Payer: Self-pay

## 2019-04-30 ENCOUNTER — Observation Stay
Admission: EM | Admit: 2019-04-30 | Discharge: 2019-05-01 | Disposition: A | Discharge status: Home / Self Care | Payer: No Typology Code available for payment source | Attending: Student | Admitting: Student

## 2019-04-30 ENCOUNTER — Ambulatory Visit (INDEPENDENT_AMBULATORY_CARE_PROVIDER_SITE_OTHER): Payer: Medicaid Other | Admitting: Family

## 2019-04-30 ENCOUNTER — Encounter: Payer: Self-pay | Admitting: Emergency Medicine

## 2019-04-30 ENCOUNTER — Encounter: Payer: Self-pay | Admitting: Family

## 2019-04-30 VITALS — HR 81 | Ht 71.0 in | Wt 132.0 lb

## 2019-04-30 DIAGNOSIS — Z681 Body mass index (BMI) 19 or less, adult: Secondary | ICD-10-CM | POA: Diagnosis not present

## 2019-04-30 DIAGNOSIS — Z79899 Other long term (current) drug therapy: Secondary | ICD-10-CM | POA: Diagnosis not present

## 2019-04-30 DIAGNOSIS — Z794 Long term (current) use of insulin: Secondary | ICD-10-CM | POA: Diagnosis not present

## 2019-04-30 DIAGNOSIS — I5022 Chronic systolic (congestive) heart failure: Secondary | ICD-10-CM | POA: Diagnosis present

## 2019-04-30 DIAGNOSIS — I959 Hypotension, unspecified: Secondary | ICD-10-CM | POA: Diagnosis not present

## 2019-04-30 DIAGNOSIS — I639 Cerebral infarction, unspecified: Secondary | ICD-10-CM | POA: Diagnosis present

## 2019-04-30 DIAGNOSIS — Z7982 Long term (current) use of aspirin: Secondary | ICD-10-CM | POA: Insufficient documentation

## 2019-04-30 DIAGNOSIS — M6281 Muscle weakness (generalized): Secondary | ICD-10-CM | POA: Diagnosis not present

## 2019-04-30 DIAGNOSIS — I9589 Other hypotension: Secondary | ICD-10-CM | POA: Diagnosis not present

## 2019-04-30 DIAGNOSIS — I5042 Chronic combined systolic (congestive) and diastolic (congestive) heart failure: Secondary | ICD-10-CM | POA: Insufficient documentation

## 2019-04-30 DIAGNOSIS — F1010 Alcohol abuse, uncomplicated: Secondary | ICD-10-CM | POA: Insufficient documentation

## 2019-04-30 DIAGNOSIS — E1100 Type 2 diabetes mellitus with hyperosmolarity without nonketotic hyperglycemic-hyperosmolar coma (NKHHC): Secondary | ICD-10-CM | POA: Insufficient documentation

## 2019-04-30 DIAGNOSIS — Z20822 Contact with and (suspected) exposure to covid-19: Secondary | ICD-10-CM | POA: Insufficient documentation

## 2019-04-30 DIAGNOSIS — E1122 Type 2 diabetes mellitus with diabetic chronic kidney disease: Secondary | ICD-10-CM | POA: Insufficient documentation

## 2019-04-30 DIAGNOSIS — E860 Dehydration: Secondary | ICD-10-CM | POA: Diagnosis not present

## 2019-04-30 DIAGNOSIS — E1110 Type 2 diabetes mellitus with ketoacidosis without coma: Secondary | ICD-10-CM | POA: Diagnosis not present

## 2019-04-30 DIAGNOSIS — I428 Other cardiomyopathies: Secondary | ICD-10-CM | POA: Diagnosis not present

## 2019-04-30 DIAGNOSIS — N179 Acute kidney failure, unspecified: Secondary | ICD-10-CM

## 2019-04-30 DIAGNOSIS — F17210 Nicotine dependence, cigarettes, uncomplicated: Secondary | ICD-10-CM | POA: Diagnosis not present

## 2019-04-30 DIAGNOSIS — Z833 Family history of diabetes mellitus: Secondary | ICD-10-CM | POA: Insufficient documentation

## 2019-04-30 DIAGNOSIS — E119 Type 2 diabetes mellitus without complications: Secondary | ICD-10-CM

## 2019-04-30 DIAGNOSIS — Z8249 Family history of ischemic heart disease and other diseases of the circulatory system: Secondary | ICD-10-CM | POA: Insufficient documentation

## 2019-04-30 DIAGNOSIS — K219 Gastro-esophageal reflux disease without esophagitis: Secondary | ICD-10-CM | POA: Diagnosis not present

## 2019-04-30 DIAGNOSIS — E43 Unspecified severe protein-calorie malnutrition: Secondary | ICD-10-CM | POA: Insufficient documentation

## 2019-04-30 DIAGNOSIS — E861 Hypovolemia: Secondary | ICD-10-CM

## 2019-04-30 DIAGNOSIS — I130 Hypertensive heart and chronic kidney disease with heart failure and stage 1 through stage 4 chronic kidney disease, or unspecified chronic kidney disease: Secondary | ICD-10-CM | POA: Diagnosis not present

## 2019-04-30 DIAGNOSIS — R7989 Other specified abnormal findings of blood chemistry: Secondary | ICD-10-CM | POA: Diagnosis present

## 2019-04-30 DIAGNOSIS — I10 Essential (primary) hypertension: Secondary | ICD-10-CM | POA: Diagnosis present

## 2019-04-30 DIAGNOSIS — Z8673 Personal history of transient ischemic attack (TIA), and cerebral infarction without residual deficits: Secondary | ICD-10-CM | POA: Insufficient documentation

## 2019-04-30 DIAGNOSIS — N1832 Chronic kidney disease, stage 3b: Secondary | ICD-10-CM | POA: Diagnosis not present

## 2019-04-30 DIAGNOSIS — F17200 Nicotine dependence, unspecified, uncomplicated: Secondary | ICD-10-CM | POA: Diagnosis present

## 2019-04-30 DIAGNOSIS — E1121 Type 2 diabetes mellitus with diabetic nephropathy: Secondary | ICD-10-CM

## 2019-04-30 DIAGNOSIS — N189 Chronic kidney disease, unspecified: Secondary | ICD-10-CM | POA: Diagnosis present

## 2019-04-30 DIAGNOSIS — I5023 Acute on chronic systolic (congestive) heart failure: Secondary | ICD-10-CM | POA: Diagnosis present

## 2019-04-30 LAB — CBC WITH DIFFERENTIAL/PLATELET
Abs Immature Granulocytes: 0.02 10*3/uL (ref 0.00–0.07)
Basophils Absolute: 0.1 10*3/uL (ref 0.0–0.1)
Basophils Relative: 2 %
Eosinophils Absolute: 0.2 10*3/uL (ref 0.0–0.5)
Eosinophils Relative: 3 %
HCT: 34.6 % — ABNORMAL LOW (ref 39.0–52.0)
Hemoglobin: 10.8 g/dL — ABNORMAL LOW (ref 13.0–17.0)
Immature Granulocytes: 0 %
Lymphocytes Relative: 29 %
Lymphs Abs: 1.8 10*3/uL (ref 0.7–4.0)
MCH: 26 pg (ref 26.0–34.0)
MCHC: 31.2 g/dL (ref 30.0–36.0)
MCV: 83.2 fL (ref 80.0–100.0)
Monocytes Absolute: 0.5 10*3/uL (ref 0.1–1.0)
Monocytes Relative: 7 %
Neutro Abs: 3.7 10*3/uL (ref 1.7–7.7)
Neutrophils Relative %: 59 %
Platelets: 490 10*3/uL — ABNORMAL HIGH (ref 150–400)
RBC: 4.16 MIL/uL — ABNORMAL LOW (ref 4.22–5.81)
RDW: 16.9 % — ABNORMAL HIGH (ref 11.5–15.5)
WBC: 6.3 10*3/uL (ref 4.0–10.5)
nRBC: 0 % (ref 0.0–0.2)

## 2019-04-30 LAB — GLUCOSE, CAPILLARY
Glucose-Capillary: 251 mg/dL — ABNORMAL HIGH (ref 70–99)
Glucose-Capillary: 257 mg/dL — ABNORMAL HIGH (ref 70–99)
Glucose-Capillary: 231 mg/dL — ABNORMAL HIGH (ref 70–99)
Glucose-Capillary: 231 mg/dL — ABNORMAL HIGH (ref 70–99)

## 2019-04-30 LAB — COMPREHENSIVE METABOLIC PANEL
ALT: 13 U/L (ref 0–44)
AST: 23 U/L (ref 15–41)
Albumin: 3.9 g/dL (ref 3.5–5.0)
Alkaline Phosphatase: 131 U/L — ABNORMAL HIGH (ref 38–126)
Anion gap: 11 (ref 5–15)
BUN: 65 mg/dL — ABNORMAL HIGH (ref 8–23)
CO2: 22 mmol/L (ref 22–32)
Calcium: 9.5 mg/dL (ref 8.9–10.3)
Chloride: 100 mmol/L (ref 98–111)
Creatinine, Ser: 1.97 mg/dL — ABNORMAL HIGH (ref 0.61–1.24)
GFR calc Af Amer: 41 mL/min — ABNORMAL LOW (ref >60)
GFR calc non Af Amer: 35 mL/min — ABNORMAL LOW (ref >60)
Glucose, Bld: 244 mg/dL — ABNORMAL HIGH (ref 70–99)
Potassium: 4.2 mmol/L (ref 3.5–5.1)
Sodium: 133 mmol/L — ABNORMAL LOW (ref 135–145)
Total Bilirubin: 0.6 mg/dL (ref 0.3–1.2)
Total Protein: 7.9 g/dL (ref 6.5–8.1)

## 2019-04-30 LAB — APTT: aPTT: 26 seconds (ref 24–36)

## 2019-04-30 LAB — LACTIC ACID, PLASMA
Lactic Acid, Venous: 4.2 mmol/L (ref 0.5–1.9)
Lactic Acid, Venous: 3.1 mmol/L (ref 0.5–1.9)

## 2019-04-30 LAB — PROTIME-INR
INR: 1.1 (ref 0.8–1.2)
Prothrombin Time: 13.6 seconds (ref 11.4–15.2)

## 2019-04-30 LAB — RESPIRATORY PANEL BY RT PCR (FLU A&B, COVID)
Influenza A by PCR: NEGATIVE
Influenza B by PCR: NEGATIVE
SARS Coronavirus 2 by RT PCR: NEGATIVE

## 2019-04-30 LAB — BRAIN NATRIURETIC PEPTIDE: B Natriuretic Peptide: 155 pg/mL — ABNORMAL HIGH (ref 0.0–100.0)

## 2019-04-30 MED ORDER — ACETAMINOPHEN 325 MG PO TABS: 650.0000 mg | ORAL_TABLET | Freq: Four times a day (QID) | ORAL | Status: DC | PRN

## 2019-04-30 MED ORDER — ONDANSETRON HCL 4 MG/2ML IJ SOLN: 4.0000 mg | Freq: Four times a day (QID) | INTRAMUSCULAR | Status: DC | PRN

## 2019-04-30 MED ORDER — ONDANSETRON HCL 4 MG PO TABS: 4.0000 mg | ORAL_TABLET | Freq: Four times a day (QID) | ORAL | Status: DC | PRN

## 2019-04-30 MED ORDER — ENSURE ENLIVE PO LIQD
237.0000 mL | Freq: Three times a day (TID) | ORAL | Status: DC
  Administered 2019-04-30 – 2019-05-01 (×3): 237 mL via ORAL

## 2019-04-30 MED ORDER — ASPIRIN 81 MG PO CHEW
81.0000 mg | CHEWABLE_TABLET | Freq: Every day | ORAL | Status: DC
  Administered 2019-05-01: 81 mg via ORAL
  Filled 2019-04-30: qty 1

## 2019-04-30 MED ORDER — NICOTINE 21 MG/24HR TD PT24
21.0000 mg | MEDICATED_PATCH | Freq: Every day | TRANSDERMAL | Status: DC
  Administered 2019-04-30 – 2019-05-01 (×2): 21 mg via TRANSDERMAL
  Filled 2019-04-30 (×2): qty 1

## 2019-04-30 MED ORDER — ACETAMINOPHEN 650 MG RE SUPP: 650.0000 mg | Freq: Four times a day (QID) | RECTAL | Status: DC | PRN

## 2019-04-30 MED ORDER — ENOXAPARIN SODIUM 40 MG/0.4ML ~~LOC~~ SOLN
40.0000 mg | SUBCUTANEOUS | Status: DC
  Filled 2019-04-30: qty 0.4

## 2019-04-30 MED ORDER — LORATADINE 10 MG PO TABS
10.0000 mg | ORAL_TABLET | Freq: Every day | ORAL | Status: DC
  Administered 2019-05-01: 10 mg via ORAL
  Filled 2019-04-30: qty 1

## 2019-04-30 MED ORDER — PANTOPRAZOLE SODIUM 40 MG PO TBEC
40.0000 mg | DELAYED_RELEASE_TABLET | Freq: Every day | ORAL | Status: DC
  Administered 2019-05-01: 40 mg via ORAL
  Filled 2019-04-30: qty 1

## 2019-04-30 MED ORDER — B-COMPLEX-C PO TABS: 1.0000 | ORAL_TABLET | Freq: Every day | ORAL | Status: DC

## 2019-04-30 MED ORDER — SODIUM CHLORIDE 0.9 % IV BOLUS
250.0000 mL | Freq: Once | INTRAVENOUS | Status: AC
  Administered 2019-04-30: 250 mL via INTRAVENOUS

## 2019-04-30 MED ORDER — INSULIN GLARGINE 100 UNIT/ML ~~LOC~~ SOLN
15.0000 [IU] | Freq: Every day | SUBCUTANEOUS | Status: DC
  Administered 2019-04-30: 15 [IU] via SUBCUTANEOUS
  Filled 2019-04-30 (×2): qty 0.15

## 2019-04-30 MED ORDER — INSULIN ASPART 100 UNIT/ML ~~LOC~~ SOLN
0.0000 [IU] | SUBCUTANEOUS | Status: DC
  Administered 2019-04-30: 3 [IU] via SUBCUTANEOUS
  Administered 2019-04-30: 5 [IU] via SUBCUTANEOUS
  Administered 2019-04-30 – 2019-05-01 (×2): 3 [IU] via SUBCUTANEOUS
  Administered 2019-05-01: 1 [IU] via SUBCUTANEOUS
  Administered 2019-05-01: 5 [IU] via SUBCUTANEOUS
  Filled 2019-04-30 (×6): qty 1

## 2019-04-30 NOTE — Progress Notes (Addendum)
     Shawn [name] is a 64 y.o. male with a hx of HFrEF, DM2, CKD, CVA, tobacco abuse presents today for hospital follow up. Echo 05/11/18 with LVEF 25-30% and mild-moderate MR.    Admitted 01/18/19 for HF exacerbation. Entrest was stopped during this admission due to decline in renal function. Seen in the ED 03/30/19 due to abdominal distention and his Spironolactone was increased due to ascites.    At visit 04/03/19 with Darylene Price, NP his blood pressure was low at 88/56 - Hydralazine was reduced to 50mg  TID. Called the office 04/12/19 noting low blood pressures. His wife had stopped Hydralazine.   Office visit 04/20/2019 he was noted to be markedly over diuresed and weak.  Home BP readings were 84/49 - 135/80. Majority of SBP at home low 100s.  His spironolactone & carvedilol doses were reduced.  BMP was recommended but as first it was unsuccessful he and his wife declined further attempts.    Subsequently presented to the ED 04/23/2019 for weakness and diarrhea.  He was admitted to [facility] 04/23/2019-04/26/2019 with HONK (hyperosmolar non-ketotic state due to DM2), hypovolemia, hyperkalemia. Cardiology service was 'curbsided' during this admission with recommendation for reduction in HF therapies to prevent recurrent hypotension, hypovolemia. He was discharged on Farxiga, Entresto 97-103 BID, Spironolactone 25mg  QHS, Torsemide 20mg  BID.   He has wife presented to the office for scheduled clinic visit.  Tells me he felt better for a few days after discharge but started yesterday started declining.  He again looks remarkably weak, fatigued, hypovolemic.  As staff could not get a systolic blood pressure higher than the 50s I wanted to see him during the rooming process. He was given water during brief time in office &subsequently had small amount of emesis. Per his wife's report he did eat breakfast and take his medications (including insulin). He had spells of delayed responsiveness and concern that  hypotension and hyperglycemia affecting mentation.   Vitals this AM at home: BP 100/64 HR 80 Glucose 'high'  Recommended urgent evaluation in the ED in lieu of office visit for safety. Systolic BP prior to leaving office 64 by my manual reading. He was escorted by myself and Alvis Lemmings, RN to the ED. Office manager walked his wife to the ED - she was understandably very overwhelmed and tearful.  Loel Dubonnet, NP  --- Vitals from home as provided by wife:  Date BP HR CBG  4/22 at 2:35 PM 130/81 86   4/22 at 8:49 PM 118/73 84 290  4/23 at 3:09 PM 130/73 75 331  4/23 at 9:15 PM 100/66 76 182  4/24 at 9:00 AM 130/80 74 224  4/25 at 9:20 AM 127/70 73 High  4/25 at 1:30 PM 89/52 86 382  4/25 at 7:30 PM 112/62 72 142  4/25 at 10:50 PM 127/68 76 328  4/26 at 7:10 AM 100/64 80 High

## 2019-04-30 NOTE — ED Notes (Signed)
Pt being taken to 2c-223 by EDT Jebb

## 2019-04-30 NOTE — ED Notes (Signed)
Wife called and updated on pt's room assignment.

## 2019-04-30 NOTE — ED Notes (Signed)
Pt's wife updated on plan of care and next steps.

## 2019-04-30 NOTE — ED Notes (Signed)
Md at bedside

## 2019-04-30 NOTE — H&P (Signed)
History and Physical    Shawn [name] PPI:[id number] DOB: 19-Apr-1955 DOA: 04/30/2019  Referring MD/NP/PA:   [facility]P: [facility] [place]   Patient coming from:  The patient is coming from home.  At baseline, pt is independent for most of ADL.        Chief Complaint: Generalized weakness and hypotension  HPI: Shawn [name] is a 64 y.o. male with medical history significant of CHF with EF 25-30%, hypertension, diabetes mellitus, stroke, GERD, polysubstance abuse, alcohol abuse (in remission), tobacco abuse, CKD stage III, who presents with generalized weakness and hypotension.  Patient was recently hospitalized from 4/19-4/22 due to [facility].  Patient has been having generalized weakness, lightheadedness and feeling like he was going to pass out, but did not since yesterday. He was seen in cardiology clinic today and was found to have hypotension with Bp at 50s. they attempted to give him some p.o. water where he started to have a choking episode and he said he was just trying to prevent himself from throwing it up.  At baseline, his blood pressures are in the low 100s normally. According to patient's wife patient has not been taking hydralazine but has been on the spironolactone and torsemide. Patient does not have chest pain, shortness breath, cough, fever or chills.  Currently patient does not have nausea, vomiting, diarrhea or abdominal pain.  No symptoms of UTI or unilateral weakness.  His blood pressure is 84/62 in ED, which improved to 103/63 after giving 250 cc normal saline.    ED Course: pt was found to have lactic acid 4.2, INR 1.1, pending COVID-19 PCR, PTT 26, BNP 155, worsening renal function, temperature normal, heart rate 70s, oxygen sat 92 to 100% on room air.  Patient is placed to progressive bed for observation.  Review of Systems:   General: no fevers, chills, no body weight gain, has fatigue HEENT: no blurry vision, hearing changes or sore throat Respiratory: no  dyspnea, coughing, wheezing CV: no chest pain, no palpitations GI: Currently no nausea, vomiting, abdominal pain, diarrhea, constipation GU: no dysuria, burning on urination, increased urinary frequency, hematuria  Ext: no leg edema Neuro: no unilateral weakness, numbness, or tingling, no vision change or hearing loss.  Has lightheadedness Skin: no rash, no skin tear. MSK: No muscle spasm, no deformity, no limitation of range of movement in spin Heme: No easy bruising.  Travel history: No recent long distant travel.  Allergy: No Known Allergies  Past Medical History:  Diagnosis Date  . Chronic combined systolic (congestive) and diastolic (congestive) heart failure ([place])    a. TTE 6/15: EF < 20%, mildly dilated LV, DD, mildly dilated LA, mod dilated RA, mild MR, mild to mod TR, mod increased posterior wall thickness, elevated LA and LVEDP; b. 4/12019 Echo: EF 20-25%, diff HK. Gr1 DD, nl RV fxn.  . CKD (chronic kidney disease), stage II   . Diabetes mellitus with complication ([place])    a. Prior admissions w/ DKA (last 12/2017).  . Essential hypertension   . NICM (nonischemic cardiomyopathy) ([place])    a. 06/2013 Echo: EF<20%; b. 06/2013 Cath: no signif dzs; c. 04/2017 Echo: 20-25%, gr1 DD.; d.05/2018 Echo: 25-30%  . Polysubstance abuse ([place])    a. etoh and tobacco  . Stroke [facility])     Past Surgical History:  Procedure Laterality Date  . NO PAST SURGERIES      Social History:  reports that he has been smoking cigarettes. He has been smoking about 0.50 packs per  day. He has never used smokeless tobacco. He reports previous alcohol use. No history on file for drug.  Family History:  Family History  Problem Relation Age of Onset  . Congestive Heart Failure Mother   . Diabetes Mother   . Congestive Heart Failure Brother      Prior to Admission medications   Medication Sig Start Date End Date Taking? Authorizing Provider  aspirin 81 MG chewable tablet Chew 1 tablet (81 mg total) by mouth  daily. 04/30/17  Yes Demetrios Loll, MD  B-Complex-C TABS Take 1 tablet by mouth daily.   Yes [provider]  cetirizine (ZYRTEC) 10 MG tablet Take 10 mg by mouth daily.   Yes [provider]  dapagliflozin propanediol (FARXIGA) 10 MG TABS tablet Take 10 mg by mouth daily before breakfast. 03/21/19  Yes Darylene Price A, FNP  hydrALAZINE (APRESOLINE) 50 MG tablet Take 50 mg by mouth 3 (three) times daily.   Yes [provider]  torsemide (DEMADEX) 20 MG tablet Take 1 tablet (20 mg total) by mouth 2 (two) times daily. 01/31/19  Yes Hackney, Otila Kluver A, FNP  insulin aspart (NOVOLOG) 100 UNIT/ML injection Inject 5 Units into the skin 3 (three) times daily with meals.    [provider]  insulin glargine (LANTUS) 100 UNIT/ML injection Inject 20 Units into the skin at bedtime.     [provider]  omeprazole (PRILOSEC) 20 MG capsule Take 20 mg by mouth daily. 12/18/18   [provider]  polyethylene glycol (MIRALAX / GLYCOLAX) 17 g packet Take 17 g by mouth daily. 03/06/19   Alisa Graff, FNP  sacubitril-valsartan (ENTRESTO) 97-103 MG Take 1 tablet by mouth 2 (two) times daily. 03/29/19   Minna Merritts, MD  spironolactone (ALDACTONE) 25 MG tablet Take 0.5 tablets (12.5 mg total) by mouth at bedtime. 04/26/19   Charlynne Cousins, MD    Physical Exam: Vitals:   04/30/19 1030 04/30/19 1100 04/30/19 1130 04/30/19 1200  BP: 103/63 108/64 112/72 112/68  Pulse: 70 71 70 71  Resp: 13 10 18 11   Temp:      TempSrc:      SpO2: 100% 100% 100% 100%  Weight:      Height:       General: Not in acute distress.  Dry mucous membrane HEENT:       Eyes: PERRL, EOMI, no scleral icterus.       ENT: No discharge from the ears and nose, no pharynx injection, no tonsillar enlargement.        Neck: No JVD, no bruit, no mass felt. Heme: No neck lymph node enlargement. Cardiac: S1/S2, RRR, No murmurs, No gallops or rubs. Respiratory: No rales, wheezing, rhonchi or  rubs. GI: Soft, nondistended, nontender, no rebound pain, no organomegaly, BS present. GU: No hematuria Ext: No pitting leg edema bilaterally. 2+DP/PT pulse bilaterally. Musculoskeletal: No joint deformities, No joint redness or warmth, no limitation of ROM in spin. Skin: No rashes.  Neuro: Alert, oriented X3, cranial nerves II-XII grossly intact, moves all extremities normally. Psych: Patient is not psychotic, no suicidal or hemocidal ideation.  Labs on Admission: I have personally reviewed following labs and imaging studies  CBC: Recent Labs  Lab 04/30/19 0950  WBC 6.3  NEUTROABS 3.7  HGB 10.8*  HCT 34.6*  MCV 83.2  PLT 937*   Basic Metabolic Panel: Recent Labs  Lab 04/23/19 1755 04/24/19 0600 04/25/19 0556 04/26/19 0416 04/30/19 0950  NA 131* 134* 134* 134* 133*  K 5.0 5.2* 5.1 4.5 4.2  CL 92* 99 99 97* 100  CO2 24 26 28 27 22   GLUCOSE 344* 134* 104* 257* 244*  BUN 80* 76* 58* 48* 65*  CREATININE 1.92* 1.44* 1.04 0.96 1.97*  CALCIUM 9.7 9.2 9.4 9.1 9.5   GFR: Estimated Creatinine Clearance: 32.9 mL/min (A) (by C-G formula based on SCr of 1.97 mg/dL (H)). Liver Function Tests: Recent Labs  Lab 04/30/19 0950  AST 23  ALT 13  ALKPHOS 131*  BILITOT 0.6  PROT 7.9  ALBUMIN 3.9   No results for input(s): LIPASE, AMYLASE in the last 168 hours. No results for input(s): AMMONIA in the last 168 hours. Coagulation Profile: Recent Labs  Lab 04/30/19 0950  INR 1.1   Cardiac Enzymes: No results for input(s): CKTOTAL, CKMB, CKMBINDEX, TROPONINI in the last 168 hours. BNP (last 3 results) No results for input(s): PROBNP in the last 8760 hours. HbA1C: No results for input(s): HGBA1C in the last 72 hours. CBG: Recent Labs  Lab 04/25/19 1627 04/25/19 2126 04/26/19 0813 04/26/19 1128 04/30/19 0945  GLUCAP 252* 258* 304* 332* 251*   Lipid Profile: No results for input(s): CHOL, HDL, LDLCALC, TRIG, CHOLHDL, LDLDIRECT in the last 72 hours. Thyroid Function  Tests: No results for input(s): TSH, T4TOTAL, FREET4, T3FREE, THYROIDAB in the last 72 hours. Anemia Panel: No results for input(s): VITAMINB12, FOLATE, FERRITIN, TIBC, IRON, RETICCTPCT in the last 72 hours. Urine analysis:    Component Value Date/Time   COLORURINE YELLOW (A) 04/23/2019 1106   APPEARANCEUR CLOUDY (A) 04/23/2019 1106   LABSPEC 1.008 04/23/2019 1106   PHURINE 5.0 04/23/2019 1106   GLUCOSEU >=500 (A) 04/23/2019 1106   HGBUR NEGATIVE 04/23/2019 1106   BILIRUBINUR NEGATIVE 04/23/2019 1106   KETONESUR NEGATIVE 04/23/2019 1106   PROTEINUR 30 (A) 04/23/2019 1106   NITRITE NEGATIVE 04/23/2019 1106   LEUKOCYTESUR NEGATIVE 04/23/2019 1106   Sepsis Labs: @LABRCNTIP (procalcitonin:4,lacticidven:4) ) Recent Results (from the past 240 hour(s))  SARS CORONAVIRUS 2 (TAT 6-24 HRS) Nasopharyngeal Nasopharyngeal Swab     Status: None   Collection Time: 04/23/19  5:55 PM   Specimen: Nasopharyngeal Swab  Result Value Ref Range Status   SARS Coronavirus 2 NEGATIVE NEGATIVE Final    Comment: (NOTE) SARS-CoV-2 target nucleic acids are NOT DETECTED. The SARS-CoV-2 RNA is generally detectable in upper and lower respiratory specimens during the acute phase of infection. Negative results do not preclude SARS-CoV-2 infection, do not rule out co-infections with other pathogens, and should not be used as the sole basis for treatment or other patient management decisions. Negative results must be combined with clinical observations, patient history, and epidemiological information. The expected result is Negative. Fact Sheet for Patients: SugarRoll.be Fact Sheet for Healthcare Providers: https://www.woods-mathews.com/ This test is not yet approved or cleared by the [place] FDA and  has been authorized for detection and/or diagnosis of SARS-CoV-2 by FDA under an Emergency Use Authorization (EUA). This EUA will remain  in effect (meaning this  test can be used) for the duration of the COVID-19 declaration under Section 56 4(b)(1) of the Act, 21 U.S.C. section 360bbb-3(b)(1), unless the authorization is terminated or revoked sooner. Performed at [facility], [place] [address] [place], [place] [address]      Radiological Exams on Admission: No results found.   EKG: Independently reviewed.  Sinus rhythm, QTC 465, T wave inversion in lateral leads, early R wave progression  Assessment/Plan Principal Problem:   Hypotension Active Problems:   Type 2  diabetes mellitus with stage 3b chronic kidney disease, with long-term current use of insulin ([name])   Smoker   Chronic systolic CHF (congestive heart failure) ([name])   Essential hypertension   Acute renal failure superimposed on stage 3b chronic kidney disease ([name])   Stroke ([name])   GERD (gastroesophageal reflux disease)   Elevated lactic acid level   Hypotension: Most likely due to overdiuresis and the continuation of hypertensive medications.  No signs of infection.  Temperature normal.  Patient responded to IV fluid quickly.  Blood pressure improved from 84/62 to 112/68 after giving 250 cc normal saline bolus  -Placed on MedSurg bed for observation. -will monitor blood pressure closely -Give IV fluid bolus as needed -Hold blood pressure medications and diuretics  Type 2 diabetes mellitus with stage 3b chronic kidney disease, with long-term current use of insulin ([place]): Most recent A1c 11.0, poorly controled. Patient is taking NovoLog, [place] and Lantus at home -will decrease Lantus dose from 20 and 15 unit daily -SSI  Smoker: -nicotine patch  Chronic systolic CHF (congestive heart failure) ([place]): 2D echo on 577/20 showed EF of 25-30%.  Patient does not have leg edema.  No DVT.  Patient seems to be dry clinically. -Hold Entresto, spironolactone and torsemide  Essential hypertension -Hold all blood pressure medications  Acute renal failure superimposed on stage  3b chronic kidney disease ([place]): Baseline creatinine 0.96 on 04/26/2019.  His creatinine is 1.97, BUN 65.  Likely due to dehydration.  ATN is also possible given hypotension. -Hold diuretics and Entresto -Patient received 250 cc normal saline bolus  Stroke ([place]) -on ASA  GERD (gastroesophageal reflux disease) -Protonix  Elevated lactic acid level: Lactic acid 4.2.  Most likely due to hypotension and hypoperfusion -Trend lactic acid level -Patient received 250 cc normal saline bolus, will not give more IV fluids due to low EF of 25 to 30%   DVT ppx: SQ Lovenox Code Status: Full code Family Communication: not done, no family member is at bed side.    Disposition Plan:  Anticipate discharge back to previous home environment Consults called:  none Admission status: Med-surg bed for obs   Status is: Observation The patient remains OBS appropriate and will d/c before 2 midnights. Dispo: The patient is from: Home              Anticipated d/c is to: Home              Anticipated d/c date is: 1 day              Patient currently is not medically stable to d/c.           Date of Service 04/30/2019    Ivor Costa [facility]   If 7PM-7AM, please contact night-coverage [email or web address] 04/30/2019, 12:32 PM

## 2019-04-30 NOTE — ED Triage Notes (Signed)
From heart failure clinic for hypotension bp 70K systolic sitting in chair. No pain. Has had vomiting.

## 2019-04-30 NOTE — ED Notes (Signed)
Date and time results received: 04/30/19 1105 (use smartphrase ".now" to insert current time)  Test: lactic Critical Value: 4.2  Name of Provider Notified: Jari Pigg  Orders Received? Or Actions Taken?: Orders Received - See Orders for details

## 2019-04-30 NOTE — ED Provider Notes (Signed)
[facility] Emergency Department Provider Note  ____________________________________________   First MD Initiated Contact with Patient 04/30/19 [phone]     (approximate)  I have reviewed the triage vital signs and the nursing notes.   HISTORY  Chief Complaint Hypotension    HPI Shawn [name] is a 64 y.o. male with heart failure with EF less than 20%, CKD, diabetes, who comes in with low blood pressures.  On last visit patient's blood pressure was 88/56 on March 30 and they reduce patient's hydralazine.  Patient then had the hydralazine stopped on 4/8 due to low blood pressures.  At baseline at home the majority of his blood pressures are in the low 100s.  According to patient's wife patient has not been taking hydralazine but has been on the spironolactone and the torsemide.  Recently discharged to the hospital few days ago.  Yesterday he noted increasing weakness and feeling like he was going to pass out.  This was more severe today.  When he was at the cardiology clinic appointment they noted his blood pressures were significantly low in the 50s and they attempted to give him some p.o. water where he started to have a choking episode and he said he was just trying to prevent himself from throwing it up.  Patient states that he feels dry in nature.  Denies any shortness of breath, chest pain, swelling.       Past Medical History:  Diagnosis Date  . Chronic combined systolic (congestive) and diastolic (congestive) heart failure ([place])    a. TTE 6/15: EF < 20%, mildly dilated LV, DD, mildly dilated LA, mod dilated RA, mild MR, mild to mod TR, mod increased posterior wall thickness, elevated LA and LVEDP; b. 4/12019 Echo: EF 20-25%, diff HK. Gr1 DD, nl RV fxn.  . CKD (chronic kidney disease), stage II   . Diabetes mellitus with complication ([place])    a. Prior admissions w/ DKA (last 12/2017).  . Essential hypertension   . NICM (nonischemic cardiomyopathy)  ([place])    a. 06/2013 Echo: EF<20%; b. 06/2013 Cath: no signif dzs; c. 04/2017 Echo: 20-25%, gr1 DD.; d.05/2018 Echo: 25-30%  . Polysubstance abuse ([place])    a. etoh and tobacco  . Stroke [facility])     Patient Active Problem List   Diagnosis Date Noted  . Protein-calorie malnutrition, severe ([place]) 04/25/2019  . Hyperosmolar non-ketotic state due to type 2 diabetes mellitus ([place]) 04/23/2019  . Hyperglycemia due to type 2 diabetes mellitus ([place]) 04/23/2019  . Hyperkalemia 04/23/2019  . Acute renal failure superimposed on stage 3b chronic kidney disease ([place]) 04/23/2019  . Diarrhea 04/23/2019  . Congestive heart failure with cardiomyopathy ([place]) 01/18/2019  . AKI (acute kidney injury) ([place])   . Fluid retention   . Nonketotic hyperglycinemia ([place]) 09/22/2018  . Nonketotic hypoglycemia 09/22/2018  . DKA, type 2 ([place]) 08/07/2018  . Essential hypertension 05/12/2018  . Chronic systolic CHF (congestive heart failure) ([place]) 12/15/2017  . NICM (nonischemic cardiomyopathy) ([place]) 05/13/2017  . Type 2 diabetes mellitus with stage 3b chronic kidney disease, with long-term current use of insulin ([place]) 05/13/2017  . Smoker 05/13/2017  . Polysubstance abuse ([place]) 05/13/2017  . Alcohol abuse 05/13/2017  . Diabetic ketoacidosis without coma associated with type 2 diabetes mellitus ([place]) 04/27/2017    Past Surgical History:  Procedure Laterality Date  . NO PAST SURGERIES      Prior to Admission medications   Medication Sig Start Date End Date Taking? Authorizing Provider  aspirin  81 MG chewable tablet Chew 1 tablet (81 mg total) by mouth daily. 04/30/17   Demetrios Loll, MD  B-Complex-C TABS Take 1 tablet by mouth daily.    [provider]  cetirizine (ZYRTEC) 10 MG tablet Take 10 mg by mouth daily.    [provider]  dapagliflozin propanediol (FARXIGA) 10 MG TABS tablet Take 10 mg by mouth daily before breakfast. 03/21/19   Alisa Graff, FNP  insulin aspart (NOVOLOG) 100 UNIT/ML injection  Inject 5 Units into the skin 3 (three) times daily with meals.    [provider]  insulin glargine (LANTUS) 100 UNIT/ML injection Inject 20 Units into the skin at bedtime.     [provider]  omeprazole (PRILOSEC) 20 MG capsule Take 20 mg by mouth daily. 12/18/18   [provider]  polyethylene glycol (MIRALAX / GLYCOLAX) 17 g packet Take 17 g by mouth daily. 03/06/19   Alisa Graff, FNP  sacubitril-valsartan (ENTRESTO) 97-103 MG Take 1 tablet by mouth 2 (two) times daily. 03/29/19   Minna Merritts, MD  spironolactone (ALDACTONE) 25 MG tablet Take 0.5 tablets (12.5 mg total) by mouth at bedtime. 04/26/19   Charlynne Cousins, MD  torsemide (DEMADEX) 20 MG tablet Take 1 tablet (20 mg total) by mouth 2 (two) times daily. 01/31/19   Alisa Graff, FNP    Allergies Patient has no known allergies.  Family History  Problem Relation Age of Onset  . Congestive Heart Failure Mother   . Diabetes Mother   . Congestive Heart Failure Brother     Social History Social History   Tobacco Use  . Smoking status: Current Every Day Smoker    Packs/day: 0.50    Types: Cigarettes  . Smokeless tobacco: Never Used  Substance Use Topics  . Alcohol use: Not Currently  . Drug use: Not on file      Review of Systems Constitutional: No fever/chills positive dehydration and lightheadedness Eyes: No visual changes. ENT: No sore throat. Cardiovascular: Denies chest pain. Respiratory: Denies shortness of breath. Gastrointestinal: No abdominal pain.  No nausea, no vomiting.  No diarrhea.  No constipation.  Positive choking event Genitourinary: Negative for dysuria. Musculoskeletal: Negative for back pain. Skin: Negative for rash. Neurological: Negative for headaches, focal weakness or numbness. All other ROS negative ____________________________________________   PHYSICAL EXAM:  Blood pressure 103/63, pulse 70, temperature 97.6 F (36.4 C), temperature source Oral,  resp. rate 13, height 5\' 11"  (1.803 m), weight 59.9 kg, SpO2 100 %.  Constitutional: Alert and oriented. Well appearing and in no acute distress. Eyes: Conjunctivae are normal. EOMI. Head: Atraumatic. Nose: No congestion/rhinnorhea. Mouth/Throat: Mucous membranes are dry Neck: No stridor. Trachea Midline. FROM Cardiovascular: Normal rate, regular rhythm. Grossly normal heart sounds.  Good peripheral circulation. Respiratory: Normal respiratory effort.  No retractions. Lungs CTAB. Gastrointestinal: Soft and nontender. No distention. No abdominal bruits.  Musculoskeletal: No lower extremity tenderness nor edema.  No joint effusions. Neurologic:  Normal speech and language. No gross focal neurologic deficits are appreciated.  Skin:  Skin is warm, dry and intact. No rash noted. Psychiatric: Mood and affect are normal. Speech and behavior are normal. GU: Deferred   ____________________________________________   LABS (all labs ordered are listed, but only abnormal results are displayed)  Labs Reviewed  GLUCOSE, CAPILLARY - Abnormal; Notable for the following components:      Result Value   Glucose-Capillary 251 (*)    All other components within normal limits  CBC WITH DIFFERENTIAL/PLATELET -  Abnormal; Notable for the following components:   RBC 4.16 (*)    Hemoglobin 10.8 (*)    HCT 34.6 (*)    RDW 16.9 (*)    Platelets 490 (*)    All other components within normal limits  COMPREHENSIVE METABOLIC PANEL - Abnormal; Notable for the following components:   Sodium 133 (*)    Glucose, Bld 244 (*)    BUN 65 (*)    Creatinine, Ser 1.97 (*)    Alkaline Phosphatase 131 (*)    GFR calc non Af Amer 35 (*)    GFR calc Af Amer 41 (*)    All other components within normal limits  BRAIN NATRIURETIC PEPTIDE - Abnormal; Notable for the following components:   B Natriuretic Peptide 155.0 (*)    All other components within normal limits  LACTIC ACID, PLASMA - Abnormal; Notable for the following  components:   Lactic Acid, Venous 4.2 (*)    All other components within normal limits  RESPIRATORY PANEL BY RT PCR (FLU A&B, COVID)  PROTIME-INR  APTT  LACTIC ACID, PLASMA   ____________________________________________   ED ECG REPORT I, Vanessa Reader, the attending physician, personally viewed and interpreted this ECG.  Normal sinus rate of 73, no st elevation, twi in 1, AVL, V6, normal intervals.  ____________________________________________  INITIAL IMPRESSION / ASSESSMENT AND PLAN / ED COURSE  KOLTEN RYBACK was evaluated in Emergency Department on 04/30/2019 for the symptoms described in the history of present illness. He was evaluated in the context of the global COVID-19 pandemic, which necessitated consideration that the patient might be at risk for infection with the SARS-CoV-2 virus that causes COVID-19. Institutional protocols and algorithms that pertain to the evaluation of patients at risk for COVID-19 are in a state of rapid change based on information released by regulatory bodies including the CDC and federal and state organizations. These policies and algorithms were followed during the patient's care in the ED.     Patient is a 64 year old with heart failure who comes in with hypotension and near LOC secondary to low blood pressures.  I suspect that this is more likely related to medications and dehydration.  Will get labs to evaluate for electrolyte abnormalities, AKI.  No chest pain or shortness of breath to suggest ACS or PE.  Will get EKG to evaluate for arrhythmia.  Patient's labs notable for elevated kidney dysfunction.  And patient looks extremely dry on exam.  Patient given 250 cc of fluid.  Patient's lactate was significantly elevated but I do not think this is secondary to sepsis.  I do not want to give full fluid resuscitation due to risk of fluid overload due to his EF of 20 to 25%.  Patient feeling much better after the  250 cc of fluid.  Even these new  abnormalities will discuss with hospital team for admission. Will repeat lactate.       ____________________________________________   FINAL CLINICAL IMPRESSION(S) / ED DIAGNOSES   Final diagnoses:  AKI (acute kidney injury) ([place])  Hypotension, unspecified hypotension type  Dehydration      MEDICATIONS GIVEN DURING THIS VISIT:  Medications  sodium chloride 0.9 % bolus 250 mL (250 mLs Intravenous New Bag/Given 04/30/19 1016)     ED Discharge Orders    None       Note:  This document was prepared using Dragon voice recognition software and may include unintentional dictation errors.   Vanessa [place], MD 04/30/19 1122

## 2019-05-01 DIAGNOSIS — R7989 Other specified abnormal findings of blood chemistry: Secondary | ICD-10-CM

## 2019-05-01 DIAGNOSIS — E861 Hypovolemia: Secondary | ICD-10-CM

## 2019-05-01 DIAGNOSIS — E1165 Type 2 diabetes mellitus with hyperglycemia: Secondary | ICD-10-CM

## 2019-05-01 DIAGNOSIS — I10 Essential (primary) hypertension: Secondary | ICD-10-CM

## 2019-05-01 DIAGNOSIS — N179 Acute kidney failure, unspecified: Secondary | ICD-10-CM

## 2019-05-01 DIAGNOSIS — I5022 Chronic systolic (congestive) heart failure: Secondary | ICD-10-CM | POA: Diagnosis not present

## 2019-05-01 DIAGNOSIS — F17200 Nicotine dependence, unspecified, uncomplicated: Secondary | ICD-10-CM

## 2019-05-01 DIAGNOSIS — E43 Unspecified severe protein-calorie malnutrition: Secondary | ICD-10-CM

## 2019-05-01 DIAGNOSIS — I9589 Other hypotension: Secondary | ICD-10-CM

## 2019-05-01 LAB — LACTIC ACID, PLASMA
Lactic Acid, Venous: 1.5 mmol/L (ref 0.5–1.9)
Lactic Acid, Venous: 2.6 mmol/L (ref 0.5–1.9)

## 2019-05-01 LAB — CBC
HCT: 30.6 % — ABNORMAL LOW (ref 39.0–52.0)
Hemoglobin: 10.2 g/dL — ABNORMAL LOW (ref 13.0–17.0)
MCH: 27 pg (ref 26.0–34.0)
MCHC: 33.3 g/dL (ref 30.0–36.0)
MCV: 81 fL (ref 80.0–100.0)
Platelets: 443 10*3/uL — ABNORMAL HIGH (ref 150–400)
RBC: 3.78 MIL/uL — ABNORMAL LOW (ref 4.22–5.81)
RDW: 16.6 % — ABNORMAL HIGH (ref 11.5–15.5)
WBC: 6.1 10*3/uL (ref 4.0–10.5)
nRBC: 0 % (ref 0.0–0.2)

## 2019-05-01 LAB — BASIC METABOLIC PANEL
Anion gap: 8 (ref 5–15)
BUN: 63 mg/dL — ABNORMAL HIGH (ref 8–23)
CO2: 26 mmol/L (ref 22–32)
Calcium: 9.5 mg/dL (ref 8.9–10.3)
Chloride: 102 mmol/L (ref 98–111)
Creatinine, Ser: 1.27 mg/dL — ABNORMAL HIGH (ref 0.61–1.24)
GFR calc Af Amer: >60 mL/min (ref >60)
GFR calc non Af Amer: >60 mL/min (ref >60)
Glucose, Bld: 132 mg/dL — ABNORMAL HIGH (ref 70–99)
Potassium: 4.3 mmol/L (ref 3.5–5.1)
Sodium: 136 mmol/L (ref 135–145)

## 2019-05-01 LAB — GLUCOSE, CAPILLARY
Glucose-Capillary: 110 mg/dL — ABNORMAL HIGH (ref 70–99)
Glucose-Capillary: 137 mg/dL — ABNORMAL HIGH (ref 70–99)
Glucose-Capillary: 207 mg/dL — ABNORMAL HIGH (ref 70–99)
Glucose-Capillary: 260 mg/dL — ABNORMAL HIGH (ref 70–99)

## 2019-05-01 MED ORDER — SACUBITRIL-VALSARTAN 24-26 MG PO TABS
1.0000 | ORAL_TABLET | Freq: Two times a day (BID) | ORAL | Status: DC
  Administered 2019-05-01: 1 via ORAL
  Filled 2019-05-01 (×2): qty 1

## 2019-05-01 MED ORDER — POLYETHYLENE GLYCOL 3350 17 G PO PACK: 17.0000 g | PACK | Freq: Every day | ORAL | 0 refills | Status: DC | PRN

## 2019-05-01 MED ORDER — TORSEMIDE 20 MG PO TABS: 20.0000 mg | ORAL_TABLET | Freq: Every day | ORAL | 1 refills | Status: DC | PRN

## 2019-05-01 MED ORDER — SACUBITRIL-VALSARTAN 24-26 MG PO TABS: 1.0000 | ORAL_TABLET | Freq: Two times a day (BID) | ORAL | 1 refills | Status: DC

## 2019-05-01 NOTE — Care Management (Signed)
Patient has order for shower chair for discharge.  Notified patient shower chair would no be covered by his insurance.  One can be purchased at medical supply store or [place]

## 2019-05-01 NOTE — Evaluation (Signed)
Physical Therapy Evaluation Patient Details Name: Shawn [name] MRN: [id number] DOB: 1955-10-25 Today's Date: 05/01/2019   History of Present Illness  Per MD notes: Pt is a 64 y.o. male with medical history significant of CHF with EF 25-30%, hypertension, diabetes mellitus, stroke, GERD, polysubstance abuse, alcohol abuse (in remission), tobacco abuse, CKD stage III, who presents with generalized weakness and hypotension.  MD assessment also includes acute on chronic renal failure.    Clinical Impression  Pt pleasant and motivated to participate during the session.  Pt's BP taken in supine at 110/78 and in standing at 107/70 with HR in the 70s for both and with no reported adverse symptoms, nursing notified.  Pt was Ind to Mod Ind with all functional tasks including amb 200+ feet without an AD and ascending and descending 3 steps with lightly touching one rail to simulate SPC use at home.  Pt was steady throughout the session with SpO2, HR, and BP WNL and without adverse symptoms.  Pt reported feeling at his baseline with no further PT needs.  Will complete PT orders at this time but will reassess pt pending a change in status upon receipt of new PT orders.      Follow Up Recommendations No PT follow up    Equipment Recommendations  None recommended by PT    Recommendations for Other Services       Precautions / Restrictions Precautions Precautions: None Restrictions Weight Bearing Restrictions: No      Mobility  Bed Mobility Overal bed mobility: Independent             General bed mobility comments: Good speed and effort with bed mobility tasks  Transfers Overall transfer level: Modified independent Equipment used: None             General transfer comment: Good control and stability with sit to/from stand transfers from multiple height surfaces  Ambulation/Gait Ambulation/Gait assistance: Independent Gait Distance (Feet): 200 Feet Assistive device:  None Gait Pattern/deviations: WFL(Within Functional Limits) Gait velocity: WNL   General Gait Details: Good cadence and stability including during start/stops and with 90 deg turns  Stairs Stairs: Yes Stairs assistance: Modified independent (Device/Increase time) Stair Management: One rail Left;Alternating pattern;Forwards Number of Stairs: 3 General stair comments: Good concentric and eccentric control and stability ascending and descending steps  Wheelchair Mobility    Modified Rankin (Stroke Patients Only)       Balance Overall balance assessment: No apparent balance deficits (not formally assessed)                                           Pertinent Vitals/Pain Pain Assessment: No/denies pain    Home Living Family/patient expects to be discharged to:: Private residence Living Arrangements: Spouse/significant other Available Help at Discharge: Family;Available 24 hours/day Type of Home: House Home Access: Stairs to enter Entrance Stairs-Rails: None Entrance Stairs-Number of Steps: 2 Home Layout: One level Home Equipment: Cane - single point;Shower seat;Grab bars - tub/shower;Walker - 4 wheels      Prior Function Level of Independence: Independent with assistive device(s)         Comments: Pt Ind with amb without an AD limited community distances with occasional SPC use PRN, Ind with ADLs, no fall history     Hand Dominance   Dominant Hand: Left    Extremity/Trunk Assessment   Upper Extremity Assessment Upper Extremity Assessment:  Defer to OT evaluation    Lower Extremity Assessment Lower Extremity Assessment: Overall WFL for tasks assessed       Communication   Communication: No difficulties  Cognition Arousal/Alertness: Awake/alert Behavior During Therapy: WFL for tasks assessed/performed Overall Cognitive Status: Within Functional Limits for tasks assessed                                        General  Comments General comments (skin integrity, edema, etc.): Nurse took orthostatic this AM prior to OT session - MD reveiwed in room c pt and stated border line orthostatic but not concerning. Vitals taken after ~10 mins standing ADLs and in room functional mobility: BP 111/74, MAP 87, HR 80, SpO2 100% on RA    Exercises Other Exercises Other Exercises: Pt educated re: OT role, energy conservation, fall prevention, d/c recommendations Other Exercises: Tooth brushing, toileting, hand washing, sitting/standing balance/tolerance, bed mobility, sup>sit, sit<>stand, in room mobility ~20 ft   Assessment/Plan    PT Assessment Patent does not need any further PT services  PT Problem List         PT Treatment Interventions      PT Goals (Current goals can be found in the Care Plan section)  Acute Rehab PT Goals Patient Stated Goal: To not return to hospital again PT Goal Formulation: All assessment and education complete, DC therapy    Frequency     Barriers to discharge        Co-evaluation               AM-PAC PT "6 Clicks" Mobility  Outcome Measure Help needed turning from your back to your side while in a flat bed without using bedrails?: None Help needed moving from lying on your back to sitting on the side of a flat bed without using bedrails?: None Help needed moving to and from a bed to a chair (including a wheelchair)?: None Help needed standing up from a chair using your arms (e.g., wheelchair or bedside chair)?: None Help needed to walk in hospital room?: None Help needed climbing 3-5 steps with a railing? : None 6 Click Score: 24    End of Session Equipment Utilized During Treatment: Gait belt Activity Tolerance: Patient tolerated treatment well Patient left: in chair;with call bell/phone within reach Nurse Communication: Mobility status PT Visit Diagnosis: Muscle weakness (generalized) (M62.81)    Time: 1540-0867 PT Time Calculation (min) (ACUTE ONLY): 21  min   Charges:   PT Evaluation $PT Eval Low Complexity: 1 Low          D. Royetta Asal PT, DPT 05/01/19, 11:36 AM

## 2019-05-01 NOTE — Discharge Summary (Signed)
Physician Discharge Summary  Shawn [name]:[id number] DOB: [birth year] DOA: 04/30/2019  [facility]P: [facility]  Admit date: 04/30/2019 Discharge date: 05/01/2019  Admitted From: Home. Disposition: Home  Recommendations for Outpatient Follow-up:  1. Follow ups as below. 2. Please obtain CBC/BMP/Mag at follow up 3. Please follow up on the following pending results: None  Home Health: None Equipment/Devices: Shower seat  Discharge Condition: Stable CODE STATUS: Full code  Follow-up Information    [facility] Follow up on 05/15/2019.   Specialty: Cardiology Why: at 10:00am. Enter through the [facility] [place] entrance Contact information: [place] [address] [place], [place] on 08/03/2019.   Specialty: General Practice Why: 10:30appointment please call within a few days to see if you can get in before this date Contact information: [place] [address] [phone]           HPI: Per Dr. Blaine Hamper  Chief Complaint: Generalized weakness and hypotension  HPI: Shawn [name] is a 64 y.o. male with medical history significant of CHF with EF 25-30%, hypertension, diabetes mellitus, stroke, GERD, polysubstance abuse, alcohol abuse (in remission), tobacco abuse, CKD stage III, who presents with generalized weakness and hypotension.  Patient was recently hospitalized from 4/19-4/22 due to [facility].  Patient has been having generalized weakness, lightheadedness and feeling like he was going to pass out, but did not since yesterday. He was seen in cardiology clinic today and was found to have hypotension with Bp at 50s. they attempted to give him some p.o. water where he started to have a choking episode and he said he was just trying to prevent himself from throwing it up. At baseline, his blood pressures are in the low 100s normally. According  to patient's wife patient has not been taking hydralazine but has been on the spironolactone and torsemide. Patient does not have chest pain, shortness breath, cough, fever or chills.  Currently patient does not have nausea, vomiting, diarrhea or abdominal pain.  No symptoms of UTI or unilateral weakness.  His blood pressure is 84/62 in ED, which improved to 103/63 after giving 250 cc normal saline.    ED Course: pt was found to have lactic acid 4.2, INR 1.1, pending COVID-19 PCR, PTT 26, BNP 155, worsening renal function, temperature normal, heart rate 70s, oxygen sat 92 to 100% on room air.  Patient is placed to progressive bed for observation.  Hospital Course: Patient admitted as such.  Hypotension resolved after IV fluid and holding his cardiac medications.  Orthostatic vitals negative.  He felt well and ready to go home and follow-up with his primary care doctor and cardiologist.  Evaluated by PT/OT and no need was identified other than shower seat.  Entresto resumed at lower dose.  Aldactone discontinued.  Torsemide reduced to 20 mg as needed. Patient was encouraged to watch his fluid and sodium intake.  He reports keeping weight log and sending it to his cardiologist at the [place].  He was instructed to take additional torsemide if short of breath, edema or significant weight gain.  He was encouraged to follow-up with his primary care doctor and cardiologist in 1 week.  See individual problem list below for more hospital course.  Discharge Diagnoses:  Hypotension:  Resolved IV fluid and holding cardiac medications.  Orthostatic vitals negative. -Adjusted cardiac meds as above.  DM-2 with hyperglycemia -Discharged on home medications.  Tobacco use disorder -Encourage smoking cessation.  Chronic systolic CHF (congestive heart failure) ([place]): 2D echo on 577/20 showed EF of 25-30%.  Hypovolemic on presentation. -Discharged on low-dose Entresto.  Discontinued Aldactone.  Torsemide 20  mg as needed. -Counseled on fluid and sodium restriction as well as daily weight.  -Encouraged to follow-up with his cardiologist in 1 week.  Essential hypertension: Was hypotensive on presentation.  Normotensive now. -Cardiac meds as above  AKI/azotemia- Baseline Cr 0.96>1.97 (admit)> 1.27. BUN 65> 63. -Adjusted cardiac meds as above. -Recheck at follow-up.  Lactic acidosis: Likely due to hypoperfusion in the setting of hypotension.  No signs and symptoms of infection.  No leukocytosis.  Improved.  History of CVA ([place]) -on ASA  GERD (gastroesophageal reflux disease) -Protonix    Severe malnutrition  Nutrition Problem: Severe Malnutrition Etiology: chronic illness(CHF, etoh abuse)  Signs/Symptoms: severe muscle depletion, severe fat depletion  Interventions: Ensure Enlive (each supplement provides 350kcal and 20 grams of protein), MVI, Liberalize Diet      Discharge Instructions  Discharge Instructions    (HEART FAILURE PATIENTS) Call MD:  Anytime you have any of the following symptoms: 1) 3 pound weight gain in 24 hours or 5 pounds in 1 week 2) shortness of breath, with or without a dry hacking cough 3) swelling in the hands, feet or stomach 4) if you have to sleep on extra pillows at night in order to breathe.   Complete by: As directed    Call MD for:  difficulty breathing, headache or visual disturbances   Complete by: As directed    Call MD for:  extreme fatigue   Complete by: As directed    Call MD for:  persistant dizziness or light-headedness   Complete by: As directed    Diet - low sodium heart healthy   Complete by: As directed    Discharge instructions   Complete by: As directed    It has been a pleasure taking care of you! You were hospitalized with weakness, low blood pressure and dizziness.  We think this is related to your heart/fluid medications including Entresto, torsemide and Aldactone.  We reduced your Entresto and torsemide and stopped your  Aldactone.  Please review your new medication list and the directions before you take your medications.  Please follow-up with your primary care doctor and cardiologist in 1 to 2 weeks, or sooner if needed.  Continue watching your sodium and fluid intake.  Continue checking your weight and keeping your weight log.  Please think about quitting smoking.  This is very important for your health.  There are a lot of options to help you quit smoking.  You may discuss these options with your primary care doctor.  You can also call 1-800-QUIT-NOW [phone]) for free smoking cessation counseling.   Take care,   Increase activity slowly   Complete by: As directed      Allergies as of 05/01/2019   No Known Allergies     Medication List    STOP taking these medications   sacubitril-valsartan 97-103 MG Commonly known as: ENTRESTO Replaced by: sacubitril-valsartan 24-26 MG   spironolactone 25 MG tablet Commonly known as: ALDACTONE     TAKE these medications   aspirin 81 MG chewable tablet Chew 1 tablet (81 mg total) by mouth daily.   B-Complex-C Tabs Take 1 tablet by mouth daily.   cetirizine 10 MG tablet Commonly known as: ZYRTEC Take 10 mg by mouth daily.   Farxiga 10 MG Tabs tablet Generic  drug: dapagliflozin propanediol Take 10 mg by mouth daily before breakfast.   insulin aspart 100 UNIT/ML injection Commonly known as: novoLOG Inject 5 Units into the skin 3 (three) times daily with meals.   insulin glargine 100 UNIT/ML injection Commonly known as: LANTUS Inject 20 Units into the skin at bedtime.   omeprazole 20 MG capsule Commonly known as: PRILOSEC Take 20 mg by mouth daily.   polyethylene glycol 17 g packet Commonly known as: MIRALAX / GLYCOLAX Take 17 g by mouth daily as needed. What changed:   when to take this  reasons to take this   sacubitril-valsartan 24-26 MG Commonly known as: ENTRESTO Take 1 tablet by mouth 2 (two) times daily. Replaces:  sacubitril-valsartan 97-103 MG   torsemide 20 MG tablet Commonly known as: DEMADEX Take 1 tablet (20 mg total) by mouth daily as needed (Shortness of breath, increased edema over 1 pound weight gain in 24 hours). What changed:   when to take this  reasons to take this            Durable Medical Equipment  (From admission, onward)         Start     Ordered   05/01/19 1048  For home use only DME Shower stool  Once     05/01/19 1047          Consultations:  None  Procedures/Studies:    No results found.    Discharge Exam: Vitals:   05/01/19 1129 05/01/19 1155  BP: 128/71 (!) 117/91  Pulse: 73 74  Resp: 16 16  Temp: 98.4 F (36.9 C) 98.3 F (36.8 C)  SpO2: 100% 100%    GENERAL: No acute distress.  Appears well.  HEENT: MMM.  Vision and hearing grossly intact.  NECK: Supple.  No apparent JVD.  RESP: On room air.  No IWOB. Good air movement bilaterally. CVS:  RRR. Heart sounds normal.  ABD/GI/GU: Bowel sounds present. Soft. Non tender.  MSK/EXT:  Moves extremities. No apparent deformity or edema.  Global muscle mass and subcu fat loss. SKIN: no apparent skin lesion or wound NEURO: Awake, alert and oriented appropriately.  No apparent focal neuro deficit. PSYCH: Calm. Normal affect.   The results of significant diagnostics from this hospitalization (including imaging, microbiology, ancillary and laboratory) are listed below for reference.     Microbiology: Recent Results (from the past 240 hour(s))  SARS CORONAVIRUS 2 (TAT 6-24 HRS) Nasopharyngeal Nasopharyngeal Swab     Status: None   Collection Time: 04/23/19  5:55 PM   Specimen: Nasopharyngeal Swab  Result Value Ref Range Status   SARS Coronavirus 2 NEGATIVE NEGATIVE Final    Comment: (NOTE) SARS-CoV-2 target nucleic acids are NOT DETECTED. The SARS-CoV-2 RNA is generally detectable in upper and lower respiratory specimens during the acute phase of infection. Negative results do not preclude  SARS-CoV-2 infection, do not rule out co-infections with other pathogens, and should not be used as the sole basis for treatment or other patient management decisions. Negative results must be combined with clinical observations, patient history, and epidemiological information. The expected result is Negative. Fact Sheet for Patients: SugarRoll.be Fact Sheet for Healthcare Providers: https://www.woods-mathews.com/ This test is not yet approved or cleared by the [place] FDA and  has been authorized for detection and/or diagnosis of SARS-CoV-2 by FDA under an Emergency Use Authorization (EUA). This EUA will remain  in effect (meaning this test can be used) for the duration of the COVID-19 declaration under Section 56 4(b)(1)  of the Act, 21 U.S.C. section 360bbb-3(b)(1), unless the authorization is terminated or revoked sooner. Performed at [facility], [place] [address] [place], [place] [address]   Respiratory Panel by RT PCR (Flu A&B, Covid) - Nasopharyngeal Swab     Status: None   Collection Time: 04/30/19 11:43 AM   Specimen: Nasopharyngeal Swab  Result Value Ref Range Status   SARS Coronavirus 2 by RT PCR NEGATIVE NEGATIVE Final    Comment: (NOTE) SARS-CoV-2 target nucleic acids are NOT DETECTED. The SARS-CoV-2 RNA is generally detectable in upper respiratoy specimens during the acute phase of infection. The lowest concentration of SARS-CoV-2 viral copies this assay can detect is 131 copies/mL. A negative result does not preclude SARS-Cov-2 infection and should not be used as the sole basis for treatment or other patient management decisions. A negative result may occur with  improper specimen collection/handling, submission of specimen other than nasopharyngeal swab, presence of viral mutation(s) within the areas targeted by this assay, and inadequate number of viral copies (<131 copies/mL). A negative result must be combined  with clinical observations, patient history, and epidemiological information. The expected result is Negative. Fact Sheet for Patients:  PinkCheek.be Fact Sheet for Healthcare Providers:  GravelBags.it This test is not yet ap proved or cleared by the [place] FDA and  has been authorized for detection and/or diagnosis of SARS-CoV-2 by FDA under an Emergency Use Authorization (EUA). This EUA will remain  in effect (meaning this test can be used) for the duration of the COVID-19 declaration under Section 564(b)(1) of the Act, 21 U.S.C. section 360bbb-3(b)(1), unless the authorization is terminated or revoked sooner.    Influenza A by PCR NEGATIVE NEGATIVE Final   Influenza B by PCR NEGATIVE NEGATIVE Final    Comment: (NOTE) The Xpert Xpress SARS-CoV-2/FLU/RSV assay is intended as an aid in  the diagnosis of influenza from Nasopharyngeal swab specimens and  should not be used as a sole basis for treatment. Nasal washings and  aspirates are unacceptable for Xpert Xpress SARS-CoV-2/FLU/RSV  testing. Fact Sheet for Patients: PinkCheek.be Fact Sheet for Healthcare Providers: GravelBags.it This test is not yet approved or cleared by the [place] FDA and  has been authorized for detection and/or diagnosis of SARS-CoV-2 by  FDA under an Emergency Use Authorization (EUA). This EUA will remain  in effect (meaning this test can be used) for the duration of the  Covid-19 declaration under Section 564(b)(1) of the Act, 21  U.S.C. section 360bbb-3(b)(1), unless the authorization is  terminated or revoked. Performed at [facility], [address] [place], [place] [address]      Labs: BNP (last 3 results) Recent Labs    01/18/19 1734 03/07/19 0956 04/30/19 0950  BNP 283.0* 394.0* 098.1*   Basic Metabolic Panel: Recent Labs  Lab 04/25/19 0556  04/26/19 0416 04/30/19 0950 05/01/19 0506  NA 134* 134* 133* 136  K 5.1 4.5 4.2 4.3  CL 99 97* 100 102  CO2 28 27 22 26   GLUCOSE 104* 257* 244* 132*  BUN 58* 48* 65* 63*  CREATININE 1.04 0.96 1.97* 1.27*  CALCIUM 9.4 9.1 9.5 9.5   Liver Function Tests: Recent Labs  Lab 04/30/19 0950  AST 23  ALT 13  ALKPHOS 131*  BILITOT 0.6  PROT 7.9  ALBUMIN 3.9   No results for input(s): LIPASE, AMYLASE in the last 168 hours. No results for input(s): AMMONIA in the last 168 hours. CBC: Recent Labs  Lab 04/30/19 0950 05/01/19 0506  WBC 6.3 6.1  NEUTROABS 3.7  --   HGB 10.8* 10.2*  HCT 34.6* 30.6*  MCV 83.2 81.0  PLT 490* 443*   Cardiac Enzymes: No results for input(s): CKTOTAL, CKMB, CKMBINDEX, TROPONINI in the last 168 hours. BNP: Invalid input(s): POCBNP CBG: Recent Labs  Lab 04/30/19 2006 05/01/19 0044 05/01/19 0500 05/01/19 0808 05/01/19 1127  GLUCAP 231* 260* 137* 110* 207*   D-Dimer No results for input(s): DDIMER in the last 72 hours. Hgb A1c No results for input(s): HGBA1C in the last 72 hours. Lipid Profile No results for input(s): CHOL, HDL, LDLCALC, TRIG, CHOLHDL, LDLDIRECT in the last 72 hours. Thyroid function studies No results for input(s): TSH, T4TOTAL, T3FREE, THYROIDAB in the last 72 hours.  Invalid input(s): FREET3 Anemia work up No results for input(s): VITAMINB12, FOLATE, FERRITIN, TIBC, IRON, RETICCTPCT in the last 72 hours. Urinalysis    Component Value Date/Time   COLORURINE YELLOW (A) 04/23/2019 1106   APPEARANCEUR CLOUDY (A) 04/23/2019 1106   LABSPEC 1.008 04/23/2019 1106   PHURINE 5.0 04/23/2019 1106   GLUCOSEU >=500 (A) 04/23/2019 1106   HGBUR NEGATIVE 04/23/2019 1106   BILIRUBINUR NEGATIVE 04/23/2019 1106   KETONESUR NEGATIVE 04/23/2019 1106   PROTEINUR 30 (A) 04/23/2019 1106   NITRITE NEGATIVE 04/23/2019 1106   LEUKOCYTESUR NEGATIVE 04/23/2019 1106   Sepsis Labs Invalid input(s): PROCALCITONIN,  WBC,  LACTICIDVEN   Time  coordinating discharge: 35 minutes  SIGNED:  Mercy Riding, MD  [facility] 05/01/2019, 2:35 PM  If 7PM-7AM, please contact night-coverage [email or web address] Password [email or web address]

## 2019-05-01 NOTE — Evaluation (Signed)
Occupational Therapy Evaluation Patient Details Name: Shawn [name] MRN: [id number] DOB: [birth year] Today's Date: 05/01/2019    History of Present Illness Shawn [name] is a 64 y.o. male with medical history significant of CHF with EF 25-30%, hypertension, diabetes mellitus, stroke, GERD, polysubstance abuse, alcohol abuse (in remission), tobacco abuse, CKD stage III, who presents with generalized weakness and hypotension.   Clinical Impression   Shawn [name] was seen for OT evaluation this date. Prior to hospital admission, pt was MOD I c ADLs using SPC as needed. Pt lives c wife who works full time in a single level home c 2 steps to enter and no rails. Pt presents to acute OT requiring SUPERVISION only c no AD for toileting at standard commode and standing grooming tasks. Vitals taken after ~10 mins standing ADLs and in room functional mobility: BP 111/74, MAP 87, HR 80, SpO2 100% on RA. Pt has no skilled acute OT needs identified and upon hospital discharge, recommend no follow up OT needed. Pt would benefit from tub transfer bench to maximize safety and independence while minimizing falls risk and caregiver burden.      Follow Up Recommendations  No OT follow up;Supervision - Intermittent    Equipment Recommendations  Other (comment)(Tub Producer, television/film/video)    Recommendations for Other Services       Precautions / Restrictions Precautions Precautions: None Restrictions Weight Bearing Restrictions: No      Mobility Bed Mobility Overal bed mobility: Modified Independent             General bed mobility comments: HOB elevated, easily came to sitting   Transfers Overall transfer level: Modified independent               General transfer comment: sit<>stand c no AD, use of R bed rail to control descent at low bed height     Balance Overall balance assessment: Modified Independent(Reaches outside BOS c no LOBs noted and no AD )                                          ADL either performed or assessed with clinical judgement   ADL Overall ADL's : Modified independent                                       General ADL Comments: Grossly SUPERVISION c no AD for tooth brushing standing sink side, toileting standing at commode, and hand washing sink side. Anticipate MOD I using RW for ADLs.      Vision Baseline Vision/History: Wears glasses Wears Glasses: At all times       Perception     Praxis      Pertinent Vitals/Pain Pain Assessment: No/denies pain     Hand Dominance Left   Extremity/Trunk Assessment Upper Extremity Assessment Upper Extremity Assessment: Overall WFL for tasks assessed   Lower Extremity Assessment Lower Extremity Assessment: Overall WFL for tasks assessed       Communication Communication Communication: No difficulties   Cognition Arousal/Alertness: Awake/alert Behavior During Therapy: WFL for tasks assessed/performed Overall Cognitive Status: Within Functional Limits for tasks assessed  General Comments  Nurse took orthostatic this AM prior to OT session - MD reveiwed in room c pt and stated border line orthostatic but not concerning. Vitals taken after ~10 mins standing ADLs and in room functional mobility: BP 111/74, MAP 87, HR 80, SpO2 100% on RA    Exercises Exercises: Other exercises Other Exercises Other Exercises: Pt educated re: OT role, energy conservation, fall prevention, d/c recommendations Other Exercises: Tooth brushing, toileting, hand washing, sitting/standing balance/tolerance, bed mobility, sup>sit, sit<>stand, in room mobility ~20 ft   Shoulder Instructions      Home Living Family/patient expects to be discharged to:: Private residence Living Arrangements: Spouse/significant other Available Help at Discharge: Family(wife works full time, pt goes to her job c her for [employer]) Type of Home:  House Home Access: Stairs to enter Technical brewer of Steps: 2 Entrance Stairs-Rails: None Home Layout: One level     Bathroom Shower/Tub: Teacher, early years/pre: Handicapped height     Home Equipment: [place] - single point;Shower seat;Grab bars - tub/shower          Prior Functioning/Environment Level of Independence: Independent with assistive device(s)        Comments: Pt reports occassional use of SPC PRN, enjoys fishing         OT Problem List: Decreased strength;Decreased activity tolerance      OT Treatment/Interventions:      OT Goals(Current goals can be found in the care plan section) Acute Rehab OT Goals Patient Stated Goal: To not return to hospital again OT Goal Formulation: With patient Time For Goal Achievement: 05/15/19 Potential to Achieve Goals: Good  OT Frequency:     Barriers to D/C:            Co-evaluation              AM-PAC OT "6 Clicks" Daily Activity     Outcome Measure Help from another person eating meals?: None Help from another person taking care of personal grooming?: A Little Help from another person toileting, which includes using toliet, bedpan, or urinal?: A Little Help from another person bathing (including washing, rinsing, drying)?: A Little Help from another person to put on and taking off regular upper body clothing?: None Help from another person to put on and taking off regular lower body clothing?: None 6 Click Score: 21   End of Session Nurse Communication: Other (comment)(Vitals after mobility)  Activity Tolerance: Patient tolerated treatment well Patient left: in bed;with call bell/phone within reach;Other (comment)(care management in room at end of session)  OT Visit Diagnosis: Muscle weakness (generalized) (M62.81)                Time: 1610-9604 OT Time Calculation (min): 24 min  Charges:  OT General Charges $OT Visit: 1 Visit OT Evaluation $OT Eval Low Complexity: 1 Low OT  Treatments $Self Care/Home Management : 8-22 mins  Dessie Coma, M.S. OTR/L  05/01/19, 10:13 AM

## 2019-05-01 NOTE — Progress Notes (Signed)
Initial Nutrition Assessment  DOCUMENTATION CODES:   Severe malnutrition in context of chronic illness  INTERVENTION:   Ensure Enlive po TID, each supplement provides 350 kcal and 20 grams of protein  Recommend MVI daily   Liberalize diet   NUTRITION DIAGNOSIS:   Severe Malnutrition related to chronic illness(CHF, etoh abuse) as evidenced by severe muscle depletion, severe fat depletion.  GOAL:   Patient will meet greater than or equal to 90% of their needs  MONITOR:   PO intake, Supplement acceptance, Labs, Weight trends, Skin, I & O's  REASON FOR ASSESSMENT:   Malnutrition Screening Tool    ASSESSMENT:   64 y.o. male past medical history of nonischemic cardiomyopathy with an EF of 30%, uncontrolled diabetes mellitus type 2, diabetic nephropathy, chronic kidney disease stage IIIb, etoh and substance abuse and CVA who presents in with weakness and hypotension   RD familiar with this patient from recent previous admit. Pt reports good appetite and oral intake at baseline. Pt does not like the hospital food. RD suspects pt with poor appetite and oral intake at baseline r/t etoh abuse. Pt eating 100% of meals in hospital currently. Pt does report that he enjoys chocolate Ensure; RD will order. RD will liberalize the heart healthy portion of pt's diet as this is restrictive of protein. Pt reports his UBW is ~140lbs. Per chart, pt's weight is documented anywhere from 125lbs-172lbs. It does appear that pt has lost some weight over the past several months. Pt weighed 148lbs in January. Per chart, this would be a 20lb(14%) weight loss in 3  months if the weights are correct; this would be significant.   Medications reviewed and include: aspirin, lovenox, insulin, protonix   Labs reviewed: BUN 63(H), creat 1.27(H) Hgb 10.2(L), Hct 30.6(L)  NUTRITION - FOCUSED PHYSICAL EXAM:    Most Recent Value  Orbital Region  Moderate depletion  Upper Arm Region  Severe depletion  Thoracic and  Lumbar Region  Severe depletion  Buccal Region  Moderate depletion  Temple Region  Severe depletion  Clavicle Bone Region  Severe depletion  Clavicle and Acromion Bone Region  Severe depletion  Scapular Bone Region  Severe depletion  Dorsal Hand  Severe depletion  Patellar Region  Severe depletion  Anterior Thigh Region  Severe depletion  Posterior Calf Region  Severe depletion  Edema (RD Assessment)  None  Hair  Reviewed  Eyes  Reviewed  Mouth  Reviewed  Skin  Reviewed  Nails  Reviewed     Diet Order:   Diet Order            Diet heart healthy/carb modified Room service appropriate? Yes; Fluid consistency: Thin  Diet effective now             EDUCATION NEEDS:   Education needs have been addressed  Skin:  Skin Assessment: Reviewed RN Assessment  Last BM:  4/27- type 7  Height:   Ht Readings from Last 1 Encounters:  04/30/19 5\' 11"  (1.803 m)    Weight:   Wt Readings from Last 1 Encounters:  04/30/19 59.9 kg    Ideal Body Weight:  78 kg  BMI:  Body mass index is 18.41 kg/m.  Estimated Nutritional Needs:   Kcal:  1800-2100kcal/day  Protein:  90-105g/day  Fluid:  1.7L/day  Koleen Distance MS, RD, LDN Please refer to [facility] for RD and/or RD on-call/weekend/after hours pager

## 2019-05-01 NOTE — Progress Notes (Signed)
Shawn [name]  A and O x 4. VSS. Pt tolerating diet well. No complaints of pain or nausea. IV removed intact, prescriptions given. Pt voiced understanding of discharge instructions with no further questions. Pt discharged via wheelchair with axillary.    Allergies as of 05/01/2019   No Known Allergies     Medication List    STOP taking these medications   sacubitril-valsartan 97-103 MG Commonly known as: ENTRESTO Replaced by: sacubitril-valsartan 24-26 MG   spironolactone 25 MG tablet Commonly known as: ALDACTONE     TAKE these medications   aspirin 81 MG chewable tablet Chew 1 tablet (81 mg total) by mouth daily.   B-Complex-C Tabs Take 1 tablet by mouth daily.   cetirizine 10 MG tablet Commonly known as: ZYRTEC Take 10 mg by mouth daily.   Farxiga 10 MG Tabs tablet Generic drug: dapagliflozin propanediol Take 10 mg by mouth daily before breakfast.   insulin aspart 100 UNIT/ML injection Commonly known as: novoLOG Inject 5 Units into the skin 3 (three) times daily with meals.   insulin glargine 100 UNIT/ML injection Commonly known as: LANTUS Inject 20 Units into the skin at bedtime.   omeprazole 20 MG capsule Commonly known as: PRILOSEC Take 20 mg by mouth daily.   polyethylene glycol 17 g packet Commonly known as: MIRALAX / GLYCOLAX Take 17 g by mouth daily as needed. What changed:   when to take this  reasons to take this   sacubitril-valsartan 24-26 MG Commonly known as: ENTRESTO Take 1 tablet by mouth 2 (two) times daily. Replaces: sacubitril-valsartan 97-103 MG   torsemide 20 MG tablet Commonly known as: DEMADEX Take 1 tablet (20 mg total) by mouth daily as needed (Shortness of breath, increased edema over 1 pound weight gain in 24 hours). What changed:   when to take this  reasons to take this            Durable Medical Equipment  (From admission, onward)         Start     Ordered   05/01/19 1048  For home use only DME Shower  stool  Once     05/01/19 1047          Vitals:   05/01/19 1129 05/01/19 1155  BP: 128/71 (!) 117/91  Pulse: 73 74  Resp: 16 16  Temp: 98.4 F (36.9 C) 98.3 F (36.8 C)  SpO2: 100% 100%    Shawn [name]

## 2019-05-03 ENCOUNTER — Telehealth: Payer: Self-pay

## 2019-05-03 DIAGNOSIS — H5213 Myopia, bilateral: Secondary | ICD-10-CM | POA: Diagnosis not present

## 2019-05-03 DIAGNOSIS — H524 Presbyopia: Secondary | ICD-10-CM | POA: Diagnosis not present

## 2019-05-03 NOTE — Telephone Encounter (Signed)
Patient contacted regarding discharge from [name] on 05/01/19.  Patient understands to follow up with provider Laurann Montana, NP on 5/3 at [place] at [facility]. Patient understands discharge instructions? yes Patient understands medications and regiment? yes Patient understands to bring all medications to this visit? yes  Spoke to patient and wife. Shawn [name] reports feeling much better since hospital discharge.  This morning 115/63, HR 74.  Wife expresses gratitude to our office for all of our hard work to get him feeling better.   Appt confirmed. No further orders at this time.

## 2019-05-03 NOTE — Telephone Encounter (Signed)
I had a reminder to myself to call him today to check on him, so good timing! Thanks for keeping me updated.   Loel Dubonnet, NP

## 2019-05-03 NOTE — Addendum Note (Signed)
Addended by: Janan Ridge on: 05/03/2019 02:05 PM   Modules accepted: Orders

## 2019-05-07 ENCOUNTER — Encounter: Payer: Self-pay | Admitting: Family

## 2019-05-07 ENCOUNTER — Ambulatory Visit (INDEPENDENT_AMBULATORY_CARE_PROVIDER_SITE_OTHER): Payer: Medicaid Other | Admitting: Family

## 2019-05-07 ENCOUNTER — Other Ambulatory Visit: Payer: Self-pay

## 2019-05-07 VITALS — BP 102/56 | HR 74 | Ht 71.0 in | Wt 128.0 lb

## 2019-05-07 DIAGNOSIS — I959 Hypotension, unspecified: Secondary | ICD-10-CM

## 2019-05-07 DIAGNOSIS — I428 Other cardiomyopathies: Secondary | ICD-10-CM

## 2019-05-07 DIAGNOSIS — I10 Essential (primary) hypertension: Secondary | ICD-10-CM

## 2019-05-07 DIAGNOSIS — Z720 Tobacco use: Secondary | ICD-10-CM

## 2019-05-07 DIAGNOSIS — I5022 Chronic systolic (congestive) heart failure: Secondary | ICD-10-CM | POA: Diagnosis not present

## 2019-05-07 NOTE — Progress Notes (Signed)
Office Visit    Patient Name: Shawn [name] Date of Encounter: 05/07/2019  Primary Care Provider:  [facility] [place] Primary Cardiologist:  Ida Rogue, MD Electrophysiologist:  None   Chief Complaint    JAELON GATLEY is a 64 y.o. male with a hx of HFrEF, DM2, CKD, CVA, tobacco abuse presents today for heart failure follow up.    Past Medical History    Past Medical History:  Diagnosis Date  . Chronic combined systolic (congestive) and diastolic (congestive) heart failure ([place])    a. TTE 6/15: EF < 20%, mildly dilated LV, DD, mildly dilated LA, mod dilated RA, mild MR, mild to mod TR, mod increased posterior wall thickness, elevated LA and LVEDP; b. 4/12019 Echo: EF 20-25%, diff HK. Gr1 DD, nl RV fxn.  . CKD (chronic kidney disease), stage II   . Diabetes mellitus with complication ([place])    a. Prior admissions w/ DKA (last 12/2017).  . Essential hypertension   . NICM (nonischemic cardiomyopathy) ([place])    a. 06/2013 Echo: EF<20%; b. 06/2013 Cath: no signif dzs; c. 04/2017 Echo: 20-25%, gr1 DD.; d.05/2018 Echo: 25-30%  . Polysubstance abuse ([place])    a. etoh and tobacco  . Stroke [facility])    Past Surgical History:  Procedure Laterality Date  . NO PAST SURGERIES      Allergies  No Known Allergies  History of Present Illness    Shawn [name] is a 64 y.o. male with a hx of HFrEF, DM2, CKD, CVA, tobacco abuse. He was last seen 04/30/19.  Echo 05/11/18 with LVEF 25-30% and mild-moderate MR.    Admitted 01/18/19 for heart failure exacerbation. Entrest was stopped during this admission due to decline in renal function. Seen in the ED 03/30/19 due to abdominal distention and his Spironolactone was increased due to ascites.    At visit 04/03/19 with Darylene Price, NP his blood pressure was low at 88/56. His Hydralazine was reduced to 50mg  TID. Called the office 04/12/19 noting low blood pressures. His wife had stopped Hydralazine. Clinic 04/20/19 markedly volume  depleted, Spironolactone and Coreg were reduced.   Subsequently admitted to [facility] 04/23/19-04/26/19 with HONK (hyperosmolar non-ketotic state due to DM2), hypovolemia, hyperkalemia. Cardiology service was 'curbsided' during this admission with recommendation for reduction in HF therapies to prevent recurrent hypotension, hypovolemia. He was discharged on Farxiga, Entresto 97-103 BID, Spironolactone 25mg  QHS, Torsemide 20mg  BID.    Presented for clinic visit 04/30/19 remarkably weak, fatigued, hypovolemic. He was sent to ED for urgent evaluation due to systolic BP in the 83M-19Q. Admitted 04/30/19-05/01/19 treated with IVF. Discharged on reduced dose Entresto, Spironolacton discontinued, Torsemide reduced to PRN.  Presents today with his wife.  He reports feeling much improved.  He is in the best spirits and with the most energy I have seen him.  He reports his fatigue has drastically improved.  Reports no edema, orthopnea, PND.  Has been taking his medications as prescribed at discharge.  His wife assists him to monitor his blood pressure, heart rate, and blood sugar very carefully.  Blood pressure readings when reviewed from home have been 98-120s/60s-70s.  Average blood pressure appears to be 110s over 70s.   Reports he has been drinking Ensure at home to try and gain some of his strength back.  Does endorse following a low-sodium diet.  Does weigh himself daily.   EKGs/Labs/Other Studies Reviewed:   The following studies were reviewed today:  Echo 05/11/2018  1. The left ventricle  has severely reduced systolic function, with an ejection fraction of 25-30%. The cavity size was mildly dilated. There is mildly increased left ventricular wall thickness. Left ventricular diastolic Doppler parameters are  consistent with pseudonormalization. Left ventrical global hypokinesis without regional wall motion abnormalities.  2. The right ventricle has normal systolic function. The cavity was normal. There is no  increase in right ventricular wall thickness.Unable to estimate RVSP  3. Left atrial size was mildly dilated.  4. Mitral valve regurgitation is mild to moderate   EKG:  EKG is ordered today.  The ekg ordered today demonstrates SR 74 bpm with voltage criteria for LVH and no acute ST/T wave changes.   Recent Labs: 09/23/2018: Magnesium 2.1 04/30/2019: ALT 13; B Natriuretic Peptide 155.0 05/01/2019: BUN 63; Creatinine, Ser 1.27; Hemoglobin 10.2; Platelets 443; Potassium 4.3; Sodium 136  Recent Lipid Panel    Component Value Date/Time   CHOL 152 06/18/2013 0427   TRIG 67 06/18/2013 0427   HDL 80 (H) 06/18/2013 0427   VLDL 13 06/18/2013 0427   LDLCALC 59 06/18/2013 0427    Home Medications   Current Meds  Medication Sig  . aspirin 81 MG chewable tablet Chew 1 tablet (81 mg total) by mouth daily.  Marland Kitchen B-Complex-C TABS Take 1 tablet by mouth daily.  . cetirizine (ZYRTEC) 10 MG tablet Take 10 mg by mouth daily.  . dapagliflozin propanediol (FARXIGA) 10 MG TABS tablet Take 10 mg by mouth daily before breakfast.  . insulin aspart (NOVOLOG) 100 UNIT/ML injection Inject 5 Units into the skin 3 (three) times daily with meals.  . insulin glargine (LANTUS) 100 UNIT/ML injection Inject 20 Units into the skin at bedtime.   Marland Kitchen omeprazole (PRILOSEC) 20 MG capsule Take 20 mg by mouth daily.  . polyethylene glycol (MIRALAX / GLYCOLAX) 17 g packet Take 17 g by mouth daily as needed.  . sacubitril-valsartan (ENTRESTO) 24-26 MG Take 1 tablet by mouth 2 (two) times daily.      Review of Systems      Review of Systems  Constitution: Negative for chills, fever and malaise/fatigue.  Cardiovascular: Negative for chest pain, dyspnea on exertion, leg swelling, near-syncope, orthopnea, palpitations and syncope.  Respiratory: Negative for cough, shortness of breath and wheezing.   Gastrointestinal: Negative for nausea and vomiting.  Neurological: Negative for dizziness, light-headedness and weakness.   All  other systems reviewed and are otherwise negative except as noted above.  Physical Exam    VS:  BP (!) 102/56 (BP Location: Left Arm, Patient Position: Sitting)   Pulse 74   Ht 5\' 11"  (1.803 m)   Wt 128 lb (58.1 kg)   BMI 17.85 kg/m  , BMI Body mass index is 17.85 kg/m. GEN: Thin, well developed, in no acute distress. HEENT: normal. Neck: Supple, no JVD, carotid bruits, or masses. Cardiac: RRR, no murmurs, rubs, or gallops. No clubbing, cyanosis, edema.  Radials/PT 2+ and equal bilaterally.  Respiratory:  Respirations regular and unlabored, clear to auscultation bilaterally. GI: Soft, nontender, nondistended, BS + x 4. MS: No deformity or atrophy. Skin: Warm and dry, no rash. Neuro:  Strength and sensation are intact. Psych: Normal affect.  Assessment & Plan    1. HFrEF/NICM - NYHA III-III. Weight down 4 lb from clinic visit one week ago, but has not been taking any diuretics. Drinking Entresto to try to 'gain his strength'. GDMT limited by hypotension and hypovolemia. Coreg, Spironolactone, Hydralazine have been discontinued due to hypotension and hypovolemia - would not recommend resuming  at this time. Continue Entresto at reduced dose 24-26mg  BID (previously on max dose) - he will hold Entresto for SBP <100. Will need to monitor clinical status carefully, hopeful he will continue to tolerate this medication. Continue Torsemide 20mg  PRN for weight gain 3lb overnight or 5lb in 1 week, has not needed since discharge.  Continue Farxiga.   2. DM2 -follows with endocrinology.  03/15/2019 A1c 11.4.  CBGs since most recent discharge have been much improved.  No longer getting 'high' readings a meter.  We discussed that his very elevated sugars were likely due to stress on his body.   3. Tobacco abuse - Smoking cessation encouraged. Recommend utilization of [id number].  4. HTN/hypotension -BP low normal today with no lightheadedness, dizziness, near syncope.  Monitor his blood pressure  carefully at home.  His antihypertensive regimen has been discontinued due to hypotension with the exception of low-dose Entresto 24-26 mg twice daily.  He checks his blood pressure before his doses of Entresto and last asked to hold for [facility] for SBP less than 100.  5. CKD - 05/01/19 renal function BUN 63, creatinine 1.27, GFR >60 while admitted for overdiuresis and dehydration. Has been drinking fluids at home.  Careful titration of diuretics.  6. Hx of CVA -continue aspirin, statin.  Reports no recurrent symptoms.  Disposition: Follow up in 8 week(s) with Dr. Rockey Situ as previously scheduled.    Loel Dubonnet, NP 05/07/2019, 10:18 AM

## 2019-05-07 NOTE — Patient Instructions (Signed)
Medication Instructions:  NO medication changes today.  *If you need a refill on your cardiac medications before your next appointment, please call your pharmacy*  Lab Work: No lab work today.  Testing/Procedures: Your EKG today was stable compared to previous. Good result!   Follow-Up: At [facility], you and your health needs are our priority.  As part of our continuing mission to provide you with exceptional heart care, we have created designated Provider Care Teams.  These Care Teams include your primary Cardiologist (physician) and Advanced Practice Providers (APPs -  Physician Assistants and Nurse Practitioners) who all work together to provide you with the care you need, when you need it.  We recommend signing up for the patient portal called "MyChart".  Sign up information is provided on this After Visit Summary.  MyChart is used to connect with patients for Virtual Visits (Telemedicine).  Patients are able to view lab/test results, encounter notes, upcoming appointments, etc.  Non-urgent messages can be sent to your provider as well.   To learn more about what you can do with MyChart, go to [email or web address].    Your next appointment:   8 week(s)  The format for your next appointment:   In Person  Provider:   With Dr. Rockey Situ as previously scheduled.   Other Instructions   If systolic blood pressure (top number) is less than 100, hold Entresto for that dose.    If you gain 3lbs overnight or 5 lbs in 1 week, take 1 tablet of Torsemide. If weight remains elevated the next day, call our office.    Make position changes slowly.   If your blood pressure is consistently running <105/60, please call our office

## 2019-05-07 NOTE — Progress Notes (Signed)
Error

## 2019-05-08 ENCOUNTER — Telehealth: Payer: Self-pay | Admitting: Family

## 2019-05-08 NOTE — Telephone Encounter (Signed)
Spoke to patient and wife who said he Is doing ok. He is following a low sodium diet, checking weight daily and taking his medications like prescribed. He is having no other symptoms and confirmed his follow up appointment with [place] on 5/11.    Alyse Low, [place]

## 2019-05-14 NOTE — Progress Notes (Signed)
Patient ID: Shawn [name], male    DOB: 12/01/1955, 64 y.o.   MRN: [id number]  HPI  Mr Gingras is a 64 y/o male with a history of DM, HTN, CKD, stroke, current tobacco use and chronic heart failure.   Echo report from 05/11/2018 reviewed and showed an EF of 25-30% along with mild/moderate MR.   Admitted 04/30/19 due to weakness and hypotension. Given IV fluids. Aldactone stopped and torsemide decreased. Discharged the following day. Admitted 04/23/19 due to weakness, diarrhea and hypotension. Placed on IV insulin drip and given IV fluids. Diarrhea was thought to be due to recent COVID vaccine. Discharged after 3 days.  Was in the ED 03/30/19 due to abdominal distention. Spironolactone increased due to ascites and he was released. Admitted 01/18/19 due to acute on chronic HF. Cardiology and nephrology consults obtained. Initially given IV lasix gtt and then transitioned to oral diuretics. [name] was stopped due to renal function. Discharged after 2 days.   He presents today for a follow-up visit with a chief complaint of minimal fatigue upon moderate exertion. He describes this as chronic in nature having been present for several years. He has associated weakness along with this although he does feel like it's improving. He denies any difficulty sleeping, dizziness, abdominal distention, palpitations, pedal edema, chest pain, shortness of breath, cough or weight gain.   Wife is checking his BP 4 times/ day at home and says that it runs from 120-130/ 70-80/s. She says that this morning before taking his medications, his BP was 120/70. Has been unable to get farxiga through his insurance or through the [place].   Past Medical History:  Diagnosis Date  . Chronic combined systolic (congestive) and diastolic (congestive) heart failure ([place])    a. TTE 6/15: EF < 20%, mildly dilated LV, DD, mildly dilated LA, mod dilated RA, mild MR, mild to mod TR, mod increased posterior wall thickness, elevated LA and  LVEDP; b. 4/12019 Echo: EF 20-25%, diff HK. Gr1 DD, nl RV fxn.  . CKD (chronic kidney disease), stage II   . Diabetes mellitus with complication ([place])    a. Prior admissions w/ DKA (last 12/2017).  . Essential hypertension   . NICM (nonischemic cardiomyopathy) ([place])    a. 06/2013 Echo: EF<20%; b. 06/2013 Cath: no signif dzs; c. 04/2017 Echo: 20-25%, gr1 DD.; d.05/2018 Echo: 25-30%  . Polysubstance abuse ([place])    a. etoh and tobacco  . Stroke [facility])    Past Surgical History:  Procedure Laterality Date  . NO PAST SURGERIES     Family History  Problem Relation Age of Onset  . Congestive Heart Failure Mother   . Diabetes Mother   . Congestive Heart Failure Brother    Social History   Tobacco Use  . Smoking status: Current Every Day Smoker    Packs/day: 0.50    Types: Cigarettes  . Smokeless tobacco: Never Used  Substance Use Topics  . Alcohol use: Not Currently   No Known Allergies  Prior to Admission medications   Medication Sig Start Date End Date Taking? Authorizing Provider  aspirin 81 MG chewable tablet Chew 1 tablet (81 mg total) by mouth daily. 04/30/17  Yes Demetrios Loll, MD  B-Complex-C TABS Take 1 tablet by mouth daily.   Yes [provider]  docusate sodium (COLACE) 100 MG capsule Take 100 mg by mouth 2 (two) times daily.   Yes [provider]  insulin aspart (NOVOLOG) 100 UNIT/ML injection Inject 5 Units into the skin  3 (three) times daily with meals.   Yes [provider]  insulin glargine (LANTUS) 100 UNIT/ML injection Inject 15-20 Units into the skin at bedtime.    Yes [provider]  omeprazole (PRILOSEC) 20 MG capsule Take 20 mg by mouth daily. 12/18/18  Yes [provider]  polyethylene glycol (MIRALAX / GLYCOLAX) 17 g packet Take 17 g by mouth daily as needed. 05/01/19  Yes Mercy Riding, MD  sacubitril-valsartan (ENTRESTO) 24-26 MG Take 1 tablet by mouth 2 (two) times daily. 05/01/19  Yes Mercy Riding, MD  torsemide  (DEMADEX) 20 MG tablet Take 20 mg by mouth daily as needed.   Yes [provider]  cetirizine (ZYRTEC) 10 MG tablet Take 10 mg by mouth daily.    [provider]  dapagliflozin propanediol (FARXIGA) 10 MG TABS tablet Take 10 mg by mouth daily before breakfast. Patient not taking: Reported on 05/15/2019 03/21/19   Alisa Graff, FNP    Review of Systems  Constitutional: Positive for fatigue. Negative for appetite change.  HENT: Positive for sneezing. Negative for congestion, postnasal drip and sore throat.   Eyes: Negative.   Respiratory: Negative for cough, chest tightness and shortness of breath.   Cardiovascular: Negative for chest pain, palpitations and leg swelling.  Gastrointestinal: Negative for abdominal distention and abdominal pain.  Endocrine: Negative.   Genitourinary: Negative.   Musculoskeletal: Negative for back pain, myalgias and neck pain.  Skin: Negative.   Allergic/Immunologic: Negative.   Neurological: Positive for weakness (legs are improving). Negative for dizziness and light-headedness.  Hematological: Negative for adenopathy. Does not bruise/bleed easily.  Psychiatric/Behavioral: Negative for dysphoric mood and sleep disturbance (sleeping on 2 pillows). The patient is not nervous/anxious.      Vitals:   05/15/19 0945 05/15/19 1037  BP: (!) 80/64 90/60  Pulse: (!) 58   Resp: 16   SpO2: 91%   Weight: 131 lb (59.4 kg)   Height: 5\' 11"  (1.803 m)    Wt Readings from Last 3 Encounters:  05/15/19 131 lb (59.4 kg)  05/07/19 128 lb (58.1 kg)  04/30/19 132 lb (59.9 kg)     Lab Results  Component Value Date   CREATININE 1.27 (H) 05/01/2019   CREATININE 1.97 (H) 04/30/2019   CREATININE 0.96 04/26/2019     Physical Exam Vitals and nursing note reviewed.  Constitutional:      Appearance: Normal appearance.  HENT:     Head: Normocephalic and atraumatic.  Cardiovascular:     Rate and Rhythm: Normal rate and regular rhythm.  Pulmonary:      Effort: Pulmonary effort is normal.     Breath sounds: Normal breath sounds. No wheezing or rales.  Abdominal:     General: There is no distension.     Palpations: Abdomen is soft.     Tenderness: There is no abdominal tenderness.  Musculoskeletal:        General: No tenderness.     Cervical back: Normal range of motion and neck supple.     Right lower leg: No edema.     Left lower leg: No edema.  Skin:    General: Skin is warm and dry.  Neurological:     General: No focal deficit present.     Mental Status: He is alert and oriented to person, place, and time.  Psychiatric:        Mood and Affect: Mood normal.        Behavior: Behavior normal.  Thought Content: Thought content normal.     Assessment & Plan:  1: Chronic heart failure with reduced ejection fraction- - NYHA class II - euvolemic today - weighing daily; Reminded to call for an overnight weight gain of >2 pounds or a weekly weight gain of >5 pounds - weight down 4 pounds from last visit here 6 weeks ago - not adding salt and his wife is reading food labels closely for sodium content - saw cardiology Gilford Rile) 05/07/19 - will try ordering farxiga 10mg  daily again; PA is required so once that request is received, will send it in - BP too low to titrate entresto - HR too low to initiate beta-blocker  - BNP 03/07/19 was 394.0 - PharmD reconciled medications with the patient and his wife  2: HTN- - BP quite low initially (80/64) and then had improved upon recheck with manual cuff (90/60) - wife is checking BP at home 4 times/ day and it's ranging 120-130's/ 70-80's but she left the log in the vehicle; patient denies dizziness - [facility] PCP at the [facility]  - [facility] 03/30/19 reviewed and showed sodium 138, potassium 4.0, creatinine 1.44 and GFR 60  3: DM- - saw endocrinology Pasty Arch) 03/15/19 - A1c 01/18/19 was 11.0% - home glucose this morning was 240; he's been adjusting his home insulin because he's afraid of it getting  too low - saw nephrology(Kolluru) 03/13/19  4: Tobacco use- - smoking 1/2 ppd of cigarettes - cessation discussed for 3 minutes with him    Patient did not bring his medications nor a list. Each medication was verbally reviewed with the patient and he was encouraged to bring the bottles to every visit to confirm accuracy of list.  Return in 3 months or sooner for any questions/problems before then.

## 2019-05-15 ENCOUNTER — Encounter: Payer: Self-pay | Admitting: Family

## 2019-05-15 ENCOUNTER — Ambulatory Visit: Payer: No Typology Code available for payment source | Attending: Family | Admitting: Family

## 2019-05-15 ENCOUNTER — Other Ambulatory Visit: Payer: Self-pay

## 2019-05-15 VITALS — BP 90/60 | HR 58 | Resp 16 | Ht 71.0 in | Wt 131.0 lb

## 2019-05-15 DIAGNOSIS — Z8673 Personal history of transient ischemic attack (TIA), and cerebral infarction without residual deficits: Secondary | ICD-10-CM | POA: Insufficient documentation

## 2019-05-15 DIAGNOSIS — N182 Chronic kidney disease, stage 2 (mild): Secondary | ICD-10-CM | POA: Diagnosis not present

## 2019-05-15 DIAGNOSIS — E118 Type 2 diabetes mellitus with unspecified complications: Secondary | ICD-10-CM

## 2019-05-15 DIAGNOSIS — Z79899 Other long term (current) drug therapy: Secondary | ICD-10-CM | POA: Insufficient documentation

## 2019-05-15 DIAGNOSIS — I428 Other cardiomyopathies: Secondary | ICD-10-CM | POA: Diagnosis not present

## 2019-05-15 DIAGNOSIS — I5042 Chronic combined systolic (congestive) and diastolic (congestive) heart failure: Secondary | ICD-10-CM | POA: Diagnosis not present

## 2019-05-15 DIAGNOSIS — F17210 Nicotine dependence, cigarettes, uncomplicated: Secondary | ICD-10-CM | POA: Diagnosis not present

## 2019-05-15 DIAGNOSIS — Z720 Tobacco use: Secondary | ICD-10-CM

## 2019-05-15 DIAGNOSIS — Z794 Long term (current) use of insulin: Secondary | ICD-10-CM | POA: Insufficient documentation

## 2019-05-15 DIAGNOSIS — E1122 Type 2 diabetes mellitus with diabetic chronic kidney disease: Secondary | ICD-10-CM | POA: Diagnosis not present

## 2019-05-15 DIAGNOSIS — I130 Hypertensive heart and chronic kidney disease with heart failure and stage 1 through stage 4 chronic kidney disease, or unspecified chronic kidney disease: Secondary | ICD-10-CM | POA: Diagnosis not present

## 2019-05-15 DIAGNOSIS — Z8249 Family history of ischemic heart disease and other diseases of the circulatory system: Secondary | ICD-10-CM | POA: Diagnosis not present

## 2019-05-15 DIAGNOSIS — Z7982 Long term (current) use of aspirin: Secondary | ICD-10-CM | POA: Insufficient documentation

## 2019-05-15 DIAGNOSIS — I509 Heart failure, unspecified: Secondary | ICD-10-CM | POA: Diagnosis present

## 2019-05-15 DIAGNOSIS — Z833 Family history of diabetes mellitus: Secondary | ICD-10-CM | POA: Diagnosis not present

## 2019-05-15 DIAGNOSIS — I10 Essential (primary) hypertension: Secondary | ICD-10-CM

## 2019-05-15 DIAGNOSIS — I5022 Chronic systolic (congestive) heart failure: Secondary | ICD-10-CM

## 2019-05-15 MED ORDER — FARXIGA 10 MG PO TABS: 10.0000 mg | ORAL_TABLET | Freq: Every day | ORAL | 5 refills | Status: DC

## 2019-05-15 NOTE — Progress Notes (Signed)
[place] [facility] [facility] - PHARMACIST COUNSELING NOTE  ADHERENCE ASSESSMENT  Adherence strategy: Utilizes a pillbox at home. Wife helps with medications at home.    Do you ever forget to take your medication? [] Yes (1) [x] No (0)  Do you ever skip doses due to side effects? [] Yes (1) [x] No (0)  Do you have trouble affording your medicines? [] Yes (1) [x] No (0)  Are you ever unable to pick up your medication due to transportation difficulties? [] Yes (1) [x] No (0)  Do you ever stop taking your medications because you don't believe they are helping? [] Yes (1) [x] No (0)  Total score 0    Recommendations given to patient about increasing adherence: None needed  Guideline-Directed Medical Therapy/Evidence Based Medicine  ACE/ARB/ARNI: Entresto 24-26 mg BID Beta Blocker: None Aldosterone Antagonist: None Diuretic: Torsemide 20 mg daily PRN. Patient is taking every day.     SUBJECTIVE  HPI: Patient is a 64 y/o M with PMH as below who presents to CHF clinic for follow-up. Patient was hospitalized 4/26 - 4/27 for hypotension. During hospitalization, Entresto dose was decreased, spironolactone was discontinued, and torsemide was reduced to 20 mg PRN. Other recent healthcare encounters include hospitalization 4/19 - 4/22 for HONK , ED 3/26 for ascites, and hospitalization 1/14 - 1/16 for CHF exacerbation.  Past Medical History:  Diagnosis Date  . Chronic combined systolic (congestive) and diastolic (congestive) heart failure ([place])    a. TTE 6/15: EF < 20%, mildly dilated LV, DD, mildly dilated LA, mod dilated RA, mild MR, mild to mod TR, mod increased posterior wall thickness, elevated LA and LVEDP; b. 4/12019 Echo: EF 20-25%, diff HK. Gr1 DD, nl RV fxn.  . CKD (chronic kidney disease), stage II   . Diabetes mellitus with complication ([place])    a. Prior admissions w/ DKA (last 12/2017).  . Essential hypertension   . NICM (nonischemic cardiomyopathy) ([place])     a. 06/2013 Echo: EF<20%; b. 06/2013 Cath: no signif dzs; c. 04/2017 Echo: 20-25%, gr1 DD.; d.05/2018 Echo: 25-30%  . Polysubstance abuse ([place])    a. etoh and tobacco  . Stroke ([place])       OBJECTIVE   Vital signs: HR 58, BP 80/64, weight (pounds) 131 ECHO: Date 05/11/2018, EF 25-30%, notes: LV global hypokinesis, pseudonormalization  BMP Latest Ref Rng & Units 05/01/2019 04/30/2019 04/26/2019  Glucose 70 - 99 mg/dL 132(H) 244(H) 257(H)  BUN 8 - 23 mg/dL 63(H) 65(H) 48(H)  Creatinine 0.61 - 1.24 mg/dL 1.27(H) 1.97(H) 0.96  BUN/Creat Ratio 10 - 24 - - -  Sodium 135 - 145 mmol/L 136 133(L) 134(L)  Potassium 3.5 - 5.1 mmol/L 4.3 4.2 4.5  Chloride 98 - 111 mmol/L 102 100 97(L)  CO2 22 - 32 mmol/L 26 22 27   Calcium 8.9 - 10.3 mg/dL 9.5 9.5 9.1    ASSESSMENT Patient is well-appearing in no acute distress. He is accompanied today by his wife. Denies shortness of breath / edema. Patient endorses taking medications daily as prescribed. Denies any adverse effects of therapy. Blood pressure soft in clinic today with mildly slow HR. Patient is asymptomatic. He reports not making as much urine since torsemide was decreased. Reports adequate fluid intake.   Patient reports reducing or skipping evening dose of Lantus based on nighttime blood sugar reading. He has a history of nocturnal hypoglycemia in the past and is hesitant to take full dose of Lantus in the evening. Denies recent hypoglycemic episodes. Per patient, BG running high in the  AM before breakfast. Patient was prescribed Bydureon by his endocrinologist (filled 04/17/19) but never started therapy. Reports he was told to hold off until next appointment for instructions on how to use new medication.   PLAN  1). CHF -Entresto 24-26 mg BID -Torsemide 20 mg daily PRN (patient is taking daily) -Dapagliflozin 10 mg daily - therapy stopped d/t lack of insurance coverage -Discussed with provider, will attempt to re-initiate therapy with  dapagliflozin  2). Hypertension -Recently hospitalized for hypotension where Entresto was decreased, spironolactone was d/c, and torsemide was decreased  3). T2DM, uncontrolled -Patient follows with endocrinology at [place] was 11.4% on 03/15/19 which was mildly increased from 11.0% on 01/18/19 -Antidiabetic regimen includes insulin aspart 5 units + SSI TIDAC + insulin glargine 15-20 units HS -He is checking BG TID before meals and in the evening -Counseled patient on taking Lantus as prescribed in the evenings and to notify his endocrinologist if he experiences any hypoglycemic episodes -Patient was on atorvastatin 20 mg daily. Reports this was d/c during recent hospitalization -Patient was on trial of dapagliflozin - but reports insurance would not cover this medication -Bydureon 2 mg q7d. Follow-up at next appointment  4). CKD stage II --Likely secondary to hypertension and diabetes -Scr 1.27, GFR >60, BUN 63, K 4.3, Co2 26 on 05/01/19. Per chart review, kidneys have sustained multiple hits during recent hospitalizations -Follows with nephrology (Dr. Juleen China)  5). Hx of CVA -Aspirin 81 mg daily  -Patient reports atorvastatin 20 mg daily was stopped during recent hospitalization. Un-clear why   Time spent: 15 minutes  [place] Resident 05/15/2019 9:31 AM    Current Outpatient Medications:  .  aspirin 81 MG chewable tablet, Chew 1 tablet (81 mg total) by mouth daily., Disp: 30 tablet, Rfl: 1 .  B-Complex-C TABS, Take 1 tablet by mouth daily., Disp: , Rfl:  .  cetirizine (ZYRTEC) 10 MG tablet, Take 10 mg by mouth daily., Disp: , Rfl:  .  dapagliflozin propanediol (FARXIGA) 10 MG TABS tablet, Take 10 mg by mouth daily before breakfast., Disp: 30 tablet, Rfl: 5 .  insulin aspart (NOVOLOG) 100 UNIT/ML injection, Inject 5 Units into the skin 3 (three) times daily with meals., Disp: , Rfl:  .  insulin glargine (LANTUS) 100 UNIT/ML injection, Inject 20 Units  into the skin at bedtime. , Disp: , Rfl:  .  omeprazole (PRILOSEC) 20 MG capsule, Take 20 mg by mouth daily., Disp: , Rfl:  .  polyethylene glycol (MIRALAX / GLYCOLAX) 17 g packet, Take 17 g by mouth daily as needed., Disp: 30 each, Rfl: 0 .  sacubitril-valsartan (ENTRESTO) 24-26 MG, Take 1 tablet by mouth 2 (two) times daily., Disp: 90 tablet, Rfl: 1   COUNSELING POINTS/CLINICAL PEARLS Entresto (Goal: 97/103 mg twice daily)  Warn male patient to avoid pregnancy during therapy and to report a pregnancy to a physician.  Advise patient to report symptomatic hypotension.  Side effects may include hyperkalemia, cough, dizziness, or renal failure. Torsemide  Side effects may include excessive urination.  Tell patient to report symptoms of ototoxicity.  Instruct patient to report lightheadedness or syncope.  Warn patient to avoid use of nonprescription NSAID products without first discussing it with their healthcare provider.  DRUGS TO AVOID IN HEART FAILURE  Drug or Class Mechanism  Analgesics . NSAIDs . COX-2 inhibitors . Glucocorticoids  Sodium and water retention, increased systemic vascular resistance, decreased response to diuretics   Diabetes Medications . Metformin . Thiazolidinediones o Rosiglitazone (Avandia)  o Pioglitazone (Actos) . DPP4 Inhibitors o Saxagliptin (Onglyza) o Sitagliptin (Januvia)   Lactic acidosis Possible calcium channel blockade   Unknown  Antiarrhythmics . Class I  o Flecainide o Disopyramide . Class III o Sotalol . Other o Dronedarone  Negative inotrope, proarrhythmic   Proarrhythmic, beta blockade  Negative inotrope  Antihypertensives . Alpha Blockers o Doxazosin . Calcium Channel Blockers o Diltiazem o Verapamil o Nifedipine . Central Alpha Adrenergics o Moxonidine . Peripheral Vasodilators o Minoxidil  Increases renin and aldosterone  Negative inotrope    Possible sympathetic withdrawal  Unknown   Anti-infective . Itraconazole . Amphotericin B  Negative inotrope Unknown  Hematologic . Anagrelide . Cilostazol   Possible inhibition of PD IV Inhibition of PD III causing arrhythmias  Neurologic/Psychiatric . Stimulants . Anti-Seizure Drugs o Carbamazepine o Pregabalin . Antidepressants o Tricyclics o Citalopram . Parkinsons o Bromocriptine o Pergolide o Pramipexole . Antipsychotics o Clozapine . Antimigraine o Ergotamine o Methysergide . Appetite suppressants . Bipolar o Lithium  Peripheral alpha and beta agonist activity  Negative inotrope and chronotrope Calcium channel blockade  Negative inotrope, proarrhythmic Dose-dependent QT prolongation  Excessive serotonin activity/valvular damage Excessive serotonin activity/valvular damage Unknown  IgE mediated hypersensitivy, calcium channel blockade  Excessive serotonin activity/valvular damage Excessive serotonin activity/valvular damage Valvular damage  Direct myofibrillar degeneration, adrenergic stimulation  Antimalarials . Chloroquine . Hydroxychloroquine Intracellular inhibition of lysosomal enzymes  Urologic Agents . Alpha Blockers o Doxazosin o Prazosin o Tamsulosin o Terazosin  Increased renin and aldosterone  Adapted from Page RL, et al. "Drugs That May Cause or Exacerbate Heart Failure: A Scientific Statement from the [place]." Circulation 2016; 295:M84-X32. DOI: 10.1161/CIR.0000000000000426   MEDICATION ADHERENCES TIPS AND STRATEGIES 1. Taking medication as prescribed improves patient outcomes in heart failure (reduces hospitalizations, improves symptoms, increases survival) 2. Side effects of medications can be managed by decreasing doses, switching agents, stopping drugs, or adding additional therapy. Please let someone in the [facility] know if you have having bothersome side effects so we can modify your regimen. Do not alter your medication regimen  without talking to [place].  3. Medication reminders can help patients remember to take drugs on time. If you are missing or forgetting doses you can try linking behaviors, using pill boxes, or an electronic reminder like an alarm on your phone or an app. Some people can also get automated phone calls as medication reminders.

## 2019-05-15 NOTE — Patient Instructions (Signed)
Continue weighing daily and call for an overnight weight gain of > 2 pounds or a weekly weight gain of >5 pounds. 

## 2019-05-16 NOTE — Addendum Note (Signed)
Addended by: Janan Ridge on: 05/16/2019 10:27 AM   Modules accepted: Orders

## 2019-05-29 DIAGNOSIS — H524 Presbyopia: Secondary | ICD-10-CM | POA: Diagnosis not present

## 2019-05-29 DIAGNOSIS — H5213 Myopia, bilateral: Secondary | ICD-10-CM | POA: Diagnosis not present

## 2019-05-29 DIAGNOSIS — H52223 Regular astigmatism, bilateral: Secondary | ICD-10-CM | POA: Diagnosis not present

## 2019-06-11 ENCOUNTER — Ambulatory Visit: Admission: RE | Admit: 2019-06-11 | Payer: No Typology Code available for payment source | Source: Ambulatory Visit

## 2019-06-11 ENCOUNTER — Other Ambulatory Visit: Payer: Self-pay | Admitting: Family

## 2019-06-11 ENCOUNTER — Telehealth: Payer: Self-pay | Admitting: Family

## 2019-06-11 DIAGNOSIS — I5023 Acute on chronic systolic (congestive) heart failure: Secondary | ICD-10-CM

## 2019-06-11 NOTE — Progress Notes (Signed)
Patient called to say that his abdomen was getting more swollen.

## 2019-06-11 NOTE — Telephone Encounter (Signed)
Called patient and wife several times to notify them both of his appointment we scheduled today for IV lasix per his and Darylene Price, FNP request. Was told to schedule same day and call them to notify them of appointment time as well as schedule a follow up with [place] for this week and have been unable to reach anyone.    Alyse Low, [place]

## 2019-06-20 ENCOUNTER — Other Ambulatory Visit: Payer: Self-pay

## 2019-06-20 ENCOUNTER — Emergency Department
Admission: EM | Admit: 2019-06-20 | Discharge: 2019-06-20 | Disposition: A | Discharge status: Home / Self Care | Payer: No Typology Code available for payment source | Attending: Emergency Medicine | Admitting: Emergency Medicine

## 2019-06-20 DIAGNOSIS — R1900 Intra-abdominal and pelvic swelling, mass and lump, unspecified site: Secondary | ICD-10-CM | POA: Diagnosis not present

## 2019-06-20 DIAGNOSIS — Z5321 Procedure and treatment not carried out due to patient leaving prior to being seen by health care provider: Secondary | ICD-10-CM | POA: Insufficient documentation

## 2019-06-20 LAB — COMPREHENSIVE METABOLIC PANEL
ALT: 10 U/L (ref 0–44)
AST: 20 U/L (ref 15–41)
Albumin: 3.2 g/dL — ABNORMAL LOW (ref 3.5–5.0)
Alkaline Phosphatase: 117 U/L (ref 38–126)
Anion gap: 11 (ref 5–15)
BUN: 28 mg/dL — ABNORMAL HIGH (ref 8–23)
CO2: 23 mmol/L (ref 22–32)
Calcium: 8.9 mg/dL (ref 8.9–10.3)
Chloride: 106 mmol/L (ref 98–111)
Creatinine, Ser: 1.16 mg/dL (ref 0.61–1.24)
GFR calc Af Amer: >60 mL/min (ref >60)
GFR calc non Af Amer: >60 mL/min (ref >60)
Glucose, Bld: 225 mg/dL — ABNORMAL HIGH (ref 70–99)
Potassium: 3.5 mmol/L (ref 3.5–5.1)
Sodium: 140 mmol/L (ref 135–145)
Total Bilirubin: 0.5 mg/dL (ref 0.3–1.2)
Total Protein: 6.9 g/dL (ref 6.5–8.1)

## 2019-06-20 LAB — CBC
HCT: 35.6 % — ABNORMAL LOW (ref 39.0–52.0)
Hemoglobin: 11.2 g/dL — ABNORMAL LOW (ref 13.0–17.0)
MCH: 26.8 pg (ref 26.0–34.0)
MCHC: 31.5 g/dL (ref 30.0–36.0)
MCV: 85.2 fL (ref 80.0–100.0)
Platelets: 663 10*3/uL — ABNORMAL HIGH (ref 150–400)
RBC: 4.18 MIL/uL — ABNORMAL LOW (ref 4.22–5.81)
RDW: 18.6 % — ABNORMAL HIGH (ref 11.5–15.5)
WBC: 5.8 10*3/uL (ref 4.0–10.5)
nRBC: 0 % (ref 0.0–0.2)

## 2019-06-20 LAB — LIPASE, BLOOD: Lipase: 28 U/L (ref 11–51)

## 2019-06-20 MED ORDER — SODIUM CHLORIDE 0.9% FLUSH: 3.0000 mL | Freq: Once | INTRAVENOUS | Status: DC

## 2019-06-20 NOTE — ED Notes (Signed)
Pt updated in the WR, VS reassessed. 

## 2019-06-20 NOTE — ED Triage Notes (Signed)
Pt arrives via POV for reports of stomach swelling and fluid build up that is causing him to not be able to hold his bowels. Pt reports he had an appt to get the fluid drawn off but his wife passed away and he has not been able to go to his appt. Pt in NAD, skin warm and dry.

## 2019-06-20 NOTE — ED Notes (Signed)
Pt reports leaving to go to the [place] in [place]. Pt son walked in to pick pt up and transport

## 2019-06-21 ENCOUNTER — Telehealth: Payer: Self-pay | Admitting: Emergency Medicine

## 2019-06-25 ENCOUNTER — Telehealth: Payer: Self-pay | Admitting: Cardiovascular Disease

## 2019-06-25 NOTE — Telephone Encounter (Signed)
I spoke with the patient.  He called in today with complaints of fluid in his abdomen and lower extremities.  He states his belly is so tight it is stinging.   When the patient first answered the phone. He was immediately dissatisfied with how long it took me to call him and that "we needed to do something about how I'm feeling." He was very upset that he waited in the ER for 5 hours yesterday without being seen.  He denies any recent change in his medications or diet. He states his weight is up 6-8 lbs since last week.  I advised the patient I was very sorry, but I had seen in the initial message that his wife passed away last week.  I inquired if people were bringing food to him.  He states his family had been bringing him food. He denies missing any of his medications since last week.  The patient says he needs help, he was told by someone that "a needle can be stuck in my belly and the fluid pulled out."  I advised the patient, that it does sound as though he needs be seen in the ER and further evaluated for possible IV treatment of his fluid retention.  I have also advised that we unfortunately do not have any control from the office over wait times in the ER, so he may have to wait. I have advised if he feels he needs urgent attention, he may call EMS for further evaluation.  The patient's family is with him now.   The voiced understanding of returning to the ER, but states he doesn't think he can sit in a hard chair for multiple hours.  He advised he would go back, but will go to another hospital if he has to wait.   He was more calm when the call ended and thanked me for calling him back.

## 2019-06-25 NOTE — Telephone Encounter (Signed)
Patient brother calling  Patients spouse just passed away last week Patient is in a lot of pain, stomach is swelling - as tight as a beach ball States they sat in the emergency room 5 hours and no one helped him  Patient brother is very concerned and needs advice Please call to discuss

## 2019-06-28 ENCOUNTER — Ambulatory Visit (INDEPENDENT_AMBULATORY_CARE_PROVIDER_SITE_OTHER): Payer: Medicaid Other | Admitting: Physician Assistant

## 2019-06-28 ENCOUNTER — Other Ambulatory Visit: Payer: Self-pay

## 2019-06-28 ENCOUNTER — Encounter: Payer: Self-pay | Admitting: Physician Assistant

## 2019-06-28 VITALS — BP 162/90 | HR 77 | Ht 71.0 in | Wt 148.4 lb

## 2019-06-28 DIAGNOSIS — N182 Chronic kidney disease, stage 2 (mild): Secondary | ICD-10-CM

## 2019-06-28 DIAGNOSIS — E118 Type 2 diabetes mellitus with unspecified complications: Secondary | ICD-10-CM

## 2019-06-28 DIAGNOSIS — Z794 Long term (current) use of insulin: Secondary | ICD-10-CM | POA: Diagnosis not present

## 2019-06-28 DIAGNOSIS — I428 Other cardiomyopathies: Secondary | ICD-10-CM

## 2019-06-28 DIAGNOSIS — I10 Essential (primary) hypertension: Secondary | ICD-10-CM

## 2019-06-28 DIAGNOSIS — I5023 Acute on chronic systolic (congestive) heart failure: Secondary | ICD-10-CM | POA: Diagnosis not present

## 2019-06-28 DIAGNOSIS — Z720 Tobacco use: Secondary | ICD-10-CM | POA: Diagnosis not present

## 2019-06-28 NOTE — Progress Notes (Signed)
Office Visit    Patient Name: Shawn [name] Date of Encounter: 07/02/2019  Primary Care Provider:  [place] Primary Cardiologist:  Ida Rogue, MD  Chief Complaint    Chief Complaint  Patient presents with  . office visit    Pt has no complaints. Meds verbally reviewed w/ pt.  64 yo male with PMH  Of NICM (EF 25-30%), poorly controlled DM2, prior CVAs, polysubstance abuse including alcohol, current tobacco use (1 pk/day), chronic mild to moderate bilateral LEE, and seen today for follow-up of NICM.  Past Medical History    Past Medical History:  Diagnosis Date  . Chronic combined systolic (congestive) and diastolic (congestive) heart failure ([place])    a. TTE 6/15: EF < 20%, mildly dilated LV, DD, mildly dilated LA, mod dilated RA, mild MR, mild to mod TR, mod increased posterior wall thickness, elevated LA and LVEDP; b. 4/12019 Echo: EF 20-25%, diff HK. Gr1 DD, nl RV fxn.  . CKD (chronic kidney disease), stage II   . Diabetes mellitus with complication ([place])    a. Prior admissions w/ DKA (last 12/2017).  . Essential hypertension   . NICM (nonischemic cardiomyopathy) ([place])    a. 06/2013 Echo: EF<20%; b. 06/2013 Cath: no signif dzs; c. 04/2017 Echo: 20-25%, gr1 DD.; d.05/2018 Echo: 25-30%  . Polysubstance abuse ([place])    a. etoh and tobacco  . Stroke [facility])    Past Surgical History:  Procedure Laterality Date  . NO PAST SURGERIES      Allergies  No Known Allergies  History of Present Illness    Shawn [name] is a 64 y.o. male with PMH as above. He underwent prior cardiac catheterization 6/2015with recommendation for medical therapy. He has a history of poorly controlled insulin-dependent diabetes and prior strokes, polysubstance abuse including alcohol, and current tobacco abuse.He has had ahistory of chronic mild to moderate bilateral lower extremity edema, which typically worsenedthroughout the day.   Over the holidays he was smoking  approximately 10 cigarettes / day. He estimated 30-40pounds of weight gain and progressive LEE.  He also noted a full body "itchyrash" that began 3 weeks earlier. Per PCP, it was thought that the rash was dermatitis and triamcinolone cream and antibiotic was prescribed. It was recommended he continue his Lasix and elevate his legs.   On 01/12/2019,he underwentCT abdomen pelvis that showed small bilateral pleural effusions, moderate ascites, and anascara. Bibasilar atelectasis was also noted and infiltrate not excluded. In addition, aortic atherosclerosis was noted.   He was admitted 1/14 from the office by myself for heart failure exacerbation after clinic visit 1/14. [name] was stopped due to AKI.  He was seen in the ED 03/2019 for abdominal distention with Spironolactone increased. He was admitted in April for hyperosmolar non-ketotic state due to DM2. Recommendation was for reduction in HF therapies.   When seen at [facility] 4/26, he was weak, fatigued, and hypovolemic. He was sent to the ED for urgent evaluation due to SBP 50-60s. He was admitted and treated with IVF and discharged on reduced dose Entresto with Spironolactone discontinued and Torsemide changed to PRN.   He was seen again in clinic and reported improvement in sx. He was not taking any diuretics. GDMT limited by hypotension and hypovolemia. Coreg, Spironolactone, Hydralazine discontinued and were not recommended to be resumed. He was continued on reduced dose Entresto with recommendation to hold for SBP below 100. He was continued on PRN Torsemide.   Today, he RTC and is  still doing well from a cardiac standpoint. He recently lost his wife, which was shared today. He is joined today by his sister. He reports recent [place] trip for peritoneocentesis after abdominal distention for several days and a long wait in the ED. He reports that he is attempting to get this paperwork to [place]. He declines labs today and reports he will message [place] the  labs from the [place] when he gets home today. He denies any infection or concerning findings from the fluid removed from his abdomen this past Monday (two days ago).   He continues to smoke 1/2 pack per day. He is following with the heart failure clinic. He reports good appetite and energy. He is now walking with a cane. No CP. He does have occasional palpitations with PVCs on EKG but not concerning to him. Recommended repeat labs with patient preference to defer as outlined below. No SOB at rest. He notes some deconditioning and associated DOE, though getting stronger.  He reports improved LEE and abdominal distention / volume status today, though still volume up on exam with abdominal firmness, LEE, and reduced bibasilar breath sounds. He is 20 lbs up from his previous clinic visit, though he reports weight loss today. He is not watching his salt.  He reports that he is taking his Torsemide PRN and Entresto. Discussed that we would like to restart his previous medicatioms, including Coreg, Spironolactone, and hydralazine. In order to restart Spironolactone, we would need to check labs. Coreg and hydralazine could be restarted today. Labs would need checked to titrate Torsemide and Entresto.  Long discussion that his potassium was low on previous labs and recommend that he repeat a BMET so that we can escalate GDMT and adjust diuresis as needed with patient preference to defer given he just had labs by the [place] on Monday and reports they are sending this information to [place]. Explained that the [place] does not always give [place] records quickly with patient stating that he will call once home with the labs. He is planning to travel to [place] to visit his brother for a few days and is eager for this visit. Recommended we receive these labs before he leaves [place] so that he does not have a readmission in [place], especially given he appears volume up today and is hypertensive. He reports he will call once home with labs.    Discussed smoking cessation with patient preference to defer until after his trip to [facility], stating he will quit smoking after that time.   It was again explained that any medication changes could not be performed until labs were obtained from him. BP and volume management recommended.   Home Medications    Prior to Admission medications   Medication Sig Start Date End Date Taking? Authorizing Provider  aspirin 81 MG chewable tablet Chew 1 tablet (81 mg total) by mouth daily. 04/30/17   Demetrios Loll, MD  atorvastatin (LIPITOR) 20 MG tablet Take 20 mg by mouth daily.    [provider]  B-Complex-C TABS Take 1 tablet by mouth daily.    [provider]  cetirizine (ZYRTEC) 10 MG tablet Take 10 mg by mouth daily.    [provider]  dapagliflozin propanediol (FARXIGA) 10 MG TABS tablet Take 10 mg by mouth daily before breakfast. 05/15/19   Alisa Graff, FNP  docusate sodium (COLACE) 100 MG capsule Take 100 mg by mouth 2 (two) times daily.    [provider]  Exenatide  ER 2 MG PEN Inject 2 mg into the skin once a week.    [provider]  insulin aspart (NOVOLOG) 100 UNIT/ML injection Inject 5 Units into the skin 3 (three) times daily with meals.    [provider]  insulin glargine (LANTUS) 100 UNIT/ML injection Inject 15-20 Units into the skin at bedtime.     [provider]  omeprazole (PRILOSEC) 20 MG capsule Take 20 mg by mouth daily. 12/18/18   [provider]  polyethylene glycol (MIRALAX / GLYCOLAX) 17 g packet Take 17 g by mouth daily as needed. 05/01/19   Mercy Riding, MD  sacubitril-valsartan (ENTRESTO) 24-26 MG Take 1 tablet by mouth 2 (two) times daily. 05/01/19   Mercy Riding, MD  torsemide (DEMADEX) 20 MG tablet Take 20 mg by mouth daily as needed.    [provider]    Review of Systems    He denies chest pain, pnd, orthopnea, n, v, dizziness, syncope, edema, weight gain, or early satiety. He reports  DOE due to deconditioning and occasional palpitations that are not new or concerning to him. He reports abdominal firmness, though improved since fluid removal. Describes a pressure on his stomach.  He is gaining strength and walking with a cane. All other systems reviewed and are otherwise negative except as noted above.  Physical Exam    VS:  BP (!) 162/90 (BP Location: Right Arm, Patient Position: Sitting, Cuff Size: Normal)   Pulse 77   Ht 5\' 11"  (1.803 m)   Wt 148 lb 6 oz (67.3 kg)   SpO2 98%   BMI 20.69 kg/m  , BMI Body mass index is 20.69 kg/m. GEN: Thin, in wheelchair, in no acute distress. HEENT: normal. Neck: Supple, no JVD, carotid bruits, or masses. Cardiac: RRR, extrasystole appreciated no murmurs, rubs, or gallops. No clubbing, cyanosis, moderate bilateral bilateral edema.  Radials/DP/PT 2+ and equal bilaterally.  Respiratory:  Respirations regular and unlabored, poor inspiratory effort, reduced breath sounds at the bases. GI: Soft, firm even after fluid removal with patient report of pressure, BS + x 4. MS: no deformity or atrophy. Skin: warm and dry, no rash.  Neuro:  Strength and sensation are intact. Psych: Normal affect.  Accessory Clinical Findings    ECG personally reviewed by me today - SR 77bpm with occasional PVCs, LAD, LVH, poor R wave progression in inferior leads, poor R wave progression in precordial leads, borderline IVCD with QRS 48ms - no acute changes.  VITALS Reviewed today   Temp Readings from Last 3 Encounters:  05/01/19 98.3 F (36.8 C) (Axillary)  04/26/19 98.3 F (36.8 C) (Oral)  03/30/19 98.8 F (37.1 C) (Oral)   BP Readings from Last 3 Encounters:  06/28/19 (!) 162/90  05/15/19 90/60  05/07/19 (!) 102/56   Pulse Readings from Last 3 Encounters:  06/28/19 77  05/15/19 (!) 58  05/07/19 74    Wt Readings from Last 3 Encounters:  06/28/19 148 lb 6 oz (67.3 kg)  05/15/19 131 lb (59.4 kg)  05/07/19 128 lb (58.1 kg)     LABS   reviewed today   Lab Results  Component Value Date   WBC 5.8 06/20/2019   HGB 11.2 (L) 06/20/2019   HCT 35.6 (L) 06/20/2019   MCV 85.2 06/20/2019   PLT 663 (H) 06/20/2019   Lab Results  Component Value Date   CREATININE 1.16 06/20/2019   BUN 28 (H) 06/20/2019   NA 140 06/20/2019   K 3.5 06/20/2019  CL 106 06/20/2019   CO2 23 06/20/2019   Lab Results  Component Value Date   ALT 10 06/20/2019   AST 20 06/20/2019   ALKPHOS 117 06/20/2019   BILITOT 0.5 06/20/2019   Lab Results  Component Value Date   CHOL 152 06/18/2013   HDL 80 (H) 06/18/2013   LDLCALC 59 06/18/2013   TRIG 67 06/18/2013    Lab Results  Component Value Date   HGBA1C 11.0 (H) 01/18/2019   Lab Results  Component Value Date   TSH 1.705 04/29/2017     STUDIES/PROCEDURES reviewed today   TTE 05/11/19 1. The left ventricle has severely reduced systolic function, with an  ejection fraction of 25-30%. The cavity size was mildly dilated. There is  mildly increased left ventricular wall thickness. Left ventricular  diastolic Doppler parameters are  consistent with pseudonormalization. Left ventrical global hypokinesis  without regional wall motion abnormalities.  2. The right ventricle has normal systolic function. The cavity was  normal. There is no increase in right ventricular wall thickness.Unable to  estimate RVSP  3. Left atrial size was mildly dilated.  4. Mitral valve regurgitation is mild to moderate   Assessment & Plan    Acute on chronic HFrEF --Reports improved breathing and volume status after going to the [place] for removal of abdominal fluid. Weight increase of 20 lbs from previous weight (when hypovolemic) and down (20 lbs loss) from clinic visit on 01/18/19 that required direct admission.  --Noted volume overloaded on exam and elevated BP today. Recommendation was for repeat labs and titration of diuresis. [facility] and thus declining repeat labs today.  Given his low K on his recent labs (at least, that we have record of), as well as his history of AKI, I am hesitant to adjust his torsemide or restart his Spironolactone without these repeat labs. He states he has a print out of his [place] labs and will call in with the results with recommendation to instead send a photo - our office can guide him in this process. He states we can get the records from the [place], though it was discussed that this is not always a quick process. Discussed that any medication changes to his diuresis / Torsemide or Spironolactone would need to wait until after we knew his most recent labs. He is taking PRN Torsemide. He does not really watch his salt. He is not taking additional K supplementation.  --His BP is up today in the setting of volume overload. Restart hydralazine and Coreg for now. Monitor BP at home. Low salt diet. Again recommendation again was for repeat BMET today. He assures me he is close to a hospital if needed and if he becomes volume overloaded while visiting his brother in [place].  ?Hypokalemia --On 6/16 labs. As above, declined repeat labs with strong recommendation for repeat labs. He reports he will call in with the [facility] labs from two days earlier. He reports "they stated my labs were fine."  HTN --BP suboptimal today and in the setting of likely volume overload. Recommendation was to adjust antihypertensives / diuretics with recheck of BMET and with patient stating that he already had recent labs and will call once home with those results. BMET deferred as above. Strong recommendation for repeat given low K on recent labs. --Restart Coreg and hydralazine with close monitoring of pressures. Continue current Torsemide on PRN basis. Holding Spironolactone.  Palpitations --Extrasystole appreciated on exam and EKG with PVCs.  Consider that could be secondary to low potassium. Strong recommendation for repeat labs. Deferred as patient will call with [place] labs, though  encouraged to repeat labs for today. No presyncope or syncope.   DM2 --Per endocrinology. Glycemic control recommended. Previous A1C 11.4. States his A1C has been improving. Discussed effects on cardiovascular health.  Tobacco use --Smoking cessation encouraged. He will attempt to quit once back from [place]. He wishes to defer any assistance with smoking for now.   CKD, recent hypokalemia --Repeat BMET recommended and declined.   History of CVA --Continue ASA and statin.  Total time spent with patient today 45 minutes. This includes reviewing records, evaluating the patient, and coordinating care. Also had triage call to try and get labs later the following day without contact. Face-to-face time >50%.    Arvil Chaco, PA-C 07/02/2019

## 2019-06-28 NOTE — Patient Instructions (Signed)
Medication Instructions:  Your physician recommends that you continue on your current medications as directed. Please refer to the Current Medication list given to you today.  *If you need a refill on your cardiac medications before your next appointment, please call your pharmacy*   Lab Work: None ordered If you have labs (blood work) drawn today and your tests are completely normal, you will receive your results only by: Marland Kitchen MyChart Message (if you have MyChart) OR . A paper copy in the mail If you have any lab test that is abnormal or we need to change your treatment, we will call you to review the results.   Testing/Procedures: None ordered   Follow-Up: At [facility], you and your health needs are our priority.  As part of our continuing mission to provide you with exceptional heart care, we have created designated Provider Care Teams.  These Care Teams include your primary Cardiologist (physician) and Advanced Practice Providers (APPs -  Physician Assistants and Nurse Practitioners) who all work together to provide you with the care you need, when you need it.  We recommend signing up for the patient portal called "MyChart".  Sign up information is provided on this After Visit Summary.  MyChart is used to connect with patients for Virtual Visits (Telemedicine).  Patients are able to view lab/test results, encounter notes, upcoming appointments, etc.  Non-urgent messages can be sent to your provider as well.   To learn more about what you can do with MyChart, go to [email or web address].    Your next appointment:   3 month(s)  The format for your next appointment:   In Person  Provider:    You may see Ida Rogue, MD or one of the following Advanced Practice Providers on your designated Care Team:    Murray Hodgkins, NP  Christell Faith, PA-C  Marrianne Mood, PA-C    Other Instructions N/A

## 2019-07-03 ENCOUNTER — Ambulatory Visit: Payer: Medicaid Other | Admitting: Cardiovascular Disease

## 2019-08-13 NOTE — Progress Notes (Signed)
Patient ID: Shawn [name], male    DOB: 1955/04/12, 64 y.o.   MRN: [id number]  HPI  Shawn [name] is a 64 y/o male with a history of DM, HTN, CKD, stroke, current tobacco use and chronic heart failure.   Echo report from 05/11/2018 reviewed and showed an EF of 25-30% along with mild/moderate Shawn.   Admitted 04/30/19 due to weakness and hypotension. Given IV fluids. Aldactone stopped and torsemide decreased. Discharged the following day. Admitted 04/23/19 due to weakness, diarrhea and hypotension. Placed on IV insulin drip and given IV fluids. Diarrhea was thought to be due to recent COVID vaccine. Discharged after 3 days.  Was in the ED 03/30/19 due to abdominal distention. Spironolactone increased due to ascites and he was released.   He presents today for a follow-up visit with a chief complaint of minimal fatigue upon moderate exertion. He describes this as chronic in nature having been present for several years. He has associated leg weakness along with this. He denies any difficulty sleeping, dizziness, abdominal distention, palpitations, pedal edema, chest pain, shortness of breath, cough or weight gain.   Says that he had a paracentesis done at the [place] and that his weight has stabilized. Abdominal swelling is also stable, per his report. Says that the [facility] has recently done lab work and he has a copy of those results at home.   Wife died suddenly 06/27/2019 so his sister is now helping get him to appointments etc.   Past Medical History:  Diagnosis Date  . Chronic combined systolic (congestive) and diastolic (congestive) heart failure ([place])    a. TTE 6/15: EF < 20%, mildly dilated LV, DD, mildly dilated LA, mod dilated RA, mild Shawn, mild to mod TR, mod increased posterior wall thickness, elevated LA and LVEDP; b. 4/12019 Echo: EF 20-25%, diff HK. Gr1 DD, nl RV fxn.  . CKD (chronic kidney disease), stage II   . Diabetes mellitus with complication ([place])    a. Prior admissions w/ DKA (last  12/2017).  . Essential hypertension   . NICM (nonischemic cardiomyopathy) ([place])    a. 26-Jun-2013 Echo: EF<20%; b. 06-26-13 Cath: no signif dzs; c. 04/2017 Echo: 20-25%, gr1 DD.; d.05/2018 Echo: 25-30%  . Polysubstance abuse ([place])    a. etoh and tobacco  . Stroke [facility])    Past Surgical History:  Procedure Laterality Date  . NO PAST SURGERIES     Family History  Problem Relation Age of Onset  . Congestive Heart Failure Mother   . Diabetes Mother   . Congestive Heart Failure Brother    Social History   Tobacco Use  . Smoking status: Current Every Day Smoker    Packs/day: 0.50    Types: Cigarettes  . Smokeless tobacco: Never Used  Substance Use Topics  . Alcohol use: Not Currently   No Known Allergies  Prior to Admission medications   Medication Sig Start Date End Date Taking? Authorizing Provider  aspirin 81 MG chewable tablet Chew 1 tablet (81 mg total) by mouth daily. 04/30/17  Yes Demetrios Loll, MD  atorvastatin (LIPITOR) 20 MG tablet Take 20 mg by mouth daily.   Yes [provider]  B-Complex-C TABS Take 1 tablet by mouth daily.   Yes [provider]  cetirizine (ZYRTEC) 10 MG tablet Take 10 mg by mouth daily.   Yes [provider]  dapagliflozin propanediol (FARXIGA) 10 MG TABS tablet Take 10 mg by mouth daily before breakfast. 05/15/19  Yes Alisa Graff, FNP  docusate sodium (COLACE) 100 MG capsule Take 100 mg by mouth 2 (two) times daily.   Yes [provider]  Exenatide ER 2 MG PEN Inject 2 mg into the skin once a week.   Yes [provider]  insulin aspart (NOVOLOG) 100 UNIT/ML injection Inject 5 Units into the skin 3 (three) times daily with meals.   Yes [provider]  insulin glargine (LANTUS) 100 UNIT/ML injection Inject 15-20 Units into the skin at bedtime.    Yes [provider]  omeprazole (PRILOSEC) 20 MG capsule Take 20 mg by mouth daily. 12/18/18  Yes [provider]  sacubitril-valsartan  (ENTRESTO) 24-26 MG Take 1 tablet by mouth 2 (two) times daily. 05/01/19  Yes Mercy Riding, MD  torsemide (DEMADEX) 20 MG tablet Take 20 mg by mouth daily as needed.   Yes [provider]  polyethylene glycol (MIRALAX / GLYCOLAX) 17 g packet Take 17 g by mouth daily as needed. Patient not taking: Reported on 08/14/2019 05/01/19   Mercy Riding, MD    Review of Systems  Constitutional: Positive for fatigue. Negative for appetite change.  HENT: Negative for congestion, postnasal drip, sneezing and sore throat.   Eyes: Negative.   Respiratory: Negative for cough, chest tightness and shortness of breath.   Cardiovascular: Negative for chest pain, palpitations and leg swelling.  Gastrointestinal: Positive for constipation. Negative for abdominal distention and abdominal pain.  Endocrine: Negative.   Genitourinary: Negative.   Musculoskeletal: Negative for back pain, myalgias and neck pain.  Skin: Negative.   Allergic/Immunologic: Negative.   Neurological: Positive for weakness (legs are improving). Negative for dizziness and light-headedness.  Hematological: Negative for adenopathy. Does not bruise/bleed easily.  Psychiatric/Behavioral: Negative for dysphoric mood and sleep disturbance (sleeping on 2 pillows). The patient is not nervous/anxious.      Vitals:   08/14/19 1012  BP: 122/90  Pulse: 81  Resp: 18  SpO2: 100%  Weight: 151 lb 8 oz (68.7 kg)  Height: 5\' 11"  (1.803 m)   Wt Readings from Last 3 Encounters:  08/14/19 151 lb 8 oz (68.7 kg)  06/28/19 148 lb 6 oz (67.3 kg)  05/15/19 131 lb (59.4 kg)   Lab Results  Component Value Date   CREATININE 1.16 06/20/2019   CREATININE 1.27 (H) 05/01/2019   CREATININE 1.97 (H) 04/30/2019    Physical Exam Vitals and nursing note reviewed.  Constitutional:      Appearance: Normal appearance.  HENT:     Head: Normocephalic and atraumatic.  Cardiovascular:     Rate and Rhythm: Normal rate and regular rhythm.  Pulmonary:      Effort: Pulmonary effort is normal.     Breath sounds: Normal breath sounds. No wheezing or rales.  Abdominal:     General: There is distension.     Tenderness: There is no abdominal tenderness.  Musculoskeletal:        General: No tenderness.     Cervical back: Normal range of motion and neck supple.     Right lower leg: No edema.     Left lower leg: Edema (trace pitting) present.  Skin:    General: Skin is warm and dry.  Neurological:     General: No focal deficit present.     Mental Status: He is alert and oriented to person, place, and time.  Psychiatric:        Mood and Affect: Mood normal.        Behavior: Behavior normal.  Thought Content: Thought content normal.    Assessment & Plan:  1: Chronic heart failure with reduced ejection fraction- - NYHA class II - euvolemic today - weighing daily; Reminded to call for an overnight weight gain of >2 pounds or a weekly weight gain of >5 pounds - weight up 20 pounds from last visit here 3 months ago (patient says that his weight has stabilized after getting paracentesis done) - abdomen distended today but he says that he's constipated; encouraged him to resume miralax - not adding salt  - saw cardiology Mickle Plumb) 06/28/19  - BNP 03/07/19 was 394.0 - reports receiving both COVID vaccines  2: HTN- - BP looks good today - sees PCP at the [place] on 08/16/19; advised to bring copy of lab results to his cardiology appointment in September - BMP 06/20/19 reviewed and showed sodium 140, potassium 3.5, creatinine 1.16 and GFR >60  3: DM- - saw endocrinology Pasty Arch) 03/15/19 - A1c 03/15/19 was 11.4% - home glucose this morning was 236 - saw nephrology([place]) 03/13/19   Patient did not bring his medications nor a list. Each medication was verbally reviewed with the patient and he was encouraged to bring the bottles to every visit to confirm accuracy of list.  Return in 6 months or sooner for any questions/problems before then.

## 2019-08-14 ENCOUNTER — Other Ambulatory Visit: Payer: Self-pay

## 2019-08-14 ENCOUNTER — Ambulatory Visit: Payer: Medicaid Other | Attending: Family | Admitting: Family

## 2019-08-14 ENCOUNTER — Encounter: Payer: Self-pay | Admitting: Family

## 2019-08-14 VITALS — BP 122/90 | HR 81 | Resp 18 | Ht 71.0 in | Wt 151.5 lb

## 2019-08-14 DIAGNOSIS — I5042 Chronic combined systolic (congestive) and diastolic (congestive) heart failure: Secondary | ICD-10-CM | POA: Diagnosis present

## 2019-08-14 DIAGNOSIS — I130 Hypertensive heart and chronic kidney disease with heart failure and stage 1 through stage 4 chronic kidney disease, or unspecified chronic kidney disease: Secondary | ICD-10-CM | POA: Diagnosis not present

## 2019-08-14 DIAGNOSIS — N182 Chronic kidney disease, stage 2 (mild): Secondary | ICD-10-CM | POA: Diagnosis not present

## 2019-08-14 DIAGNOSIS — Z79899 Other long term (current) drug therapy: Secondary | ICD-10-CM | POA: Diagnosis not present

## 2019-08-14 DIAGNOSIS — Z8673 Personal history of transient ischemic attack (TIA), and cerebral infarction without residual deficits: Secondary | ICD-10-CM | POA: Insufficient documentation

## 2019-08-14 DIAGNOSIS — Z8249 Family history of ischemic heart disease and other diseases of the circulatory system: Secondary | ICD-10-CM | POA: Diagnosis not present

## 2019-08-14 DIAGNOSIS — Z7982 Long term (current) use of aspirin: Secondary | ICD-10-CM | POA: Diagnosis not present

## 2019-08-14 DIAGNOSIS — K5900 Constipation, unspecified: Secondary | ICD-10-CM | POA: Diagnosis not present

## 2019-08-14 DIAGNOSIS — E1122 Type 2 diabetes mellitus with diabetic chronic kidney disease: Secondary | ICD-10-CM | POA: Insufficient documentation

## 2019-08-14 DIAGNOSIS — F17210 Nicotine dependence, cigarettes, uncomplicated: Secondary | ICD-10-CM | POA: Insufficient documentation

## 2019-08-14 DIAGNOSIS — Z833 Family history of diabetes mellitus: Secondary | ICD-10-CM | POA: Insufficient documentation

## 2019-08-14 DIAGNOSIS — I5022 Chronic systolic (congestive) heart failure: Secondary | ICD-10-CM

## 2019-08-14 DIAGNOSIS — Z794 Long term (current) use of insulin: Secondary | ICD-10-CM | POA: Insufficient documentation

## 2019-08-14 DIAGNOSIS — I10 Essential (primary) hypertension: Secondary | ICD-10-CM

## 2019-08-14 NOTE — Patient Instructions (Signed)
Continue weighing daily and call for an overnight weight gain of > 2 pounds or a weekly weight gain of >5 pounds. 

## 2019-09-14 ENCOUNTER — Other Ambulatory Visit: Payer: Self-pay | Admitting: Family

## 2019-10-02 ENCOUNTER — Ambulatory Visit: Payer: No Typology Code available for payment source | Admitting: Cardiovascular Disease

## 2019-12-18 IMAGING — DX PORTABLE CHEST - 1 VIEW
2 series · 2 of 2 positions shown · non-contrast
Comparison: Portable exam 7709 hours compared to 03/22/2018

CLINICAL DATA: Lack of appetite, generally not feeling well since
[REDACTED], dehydration, hyperglycemia, history hypertension, diabetes
mellitus, CHF, smoker, cardiomyopathy

EXAM:
PORTABLE CHEST 1 VIEW

[chest ap (1 of 2)]
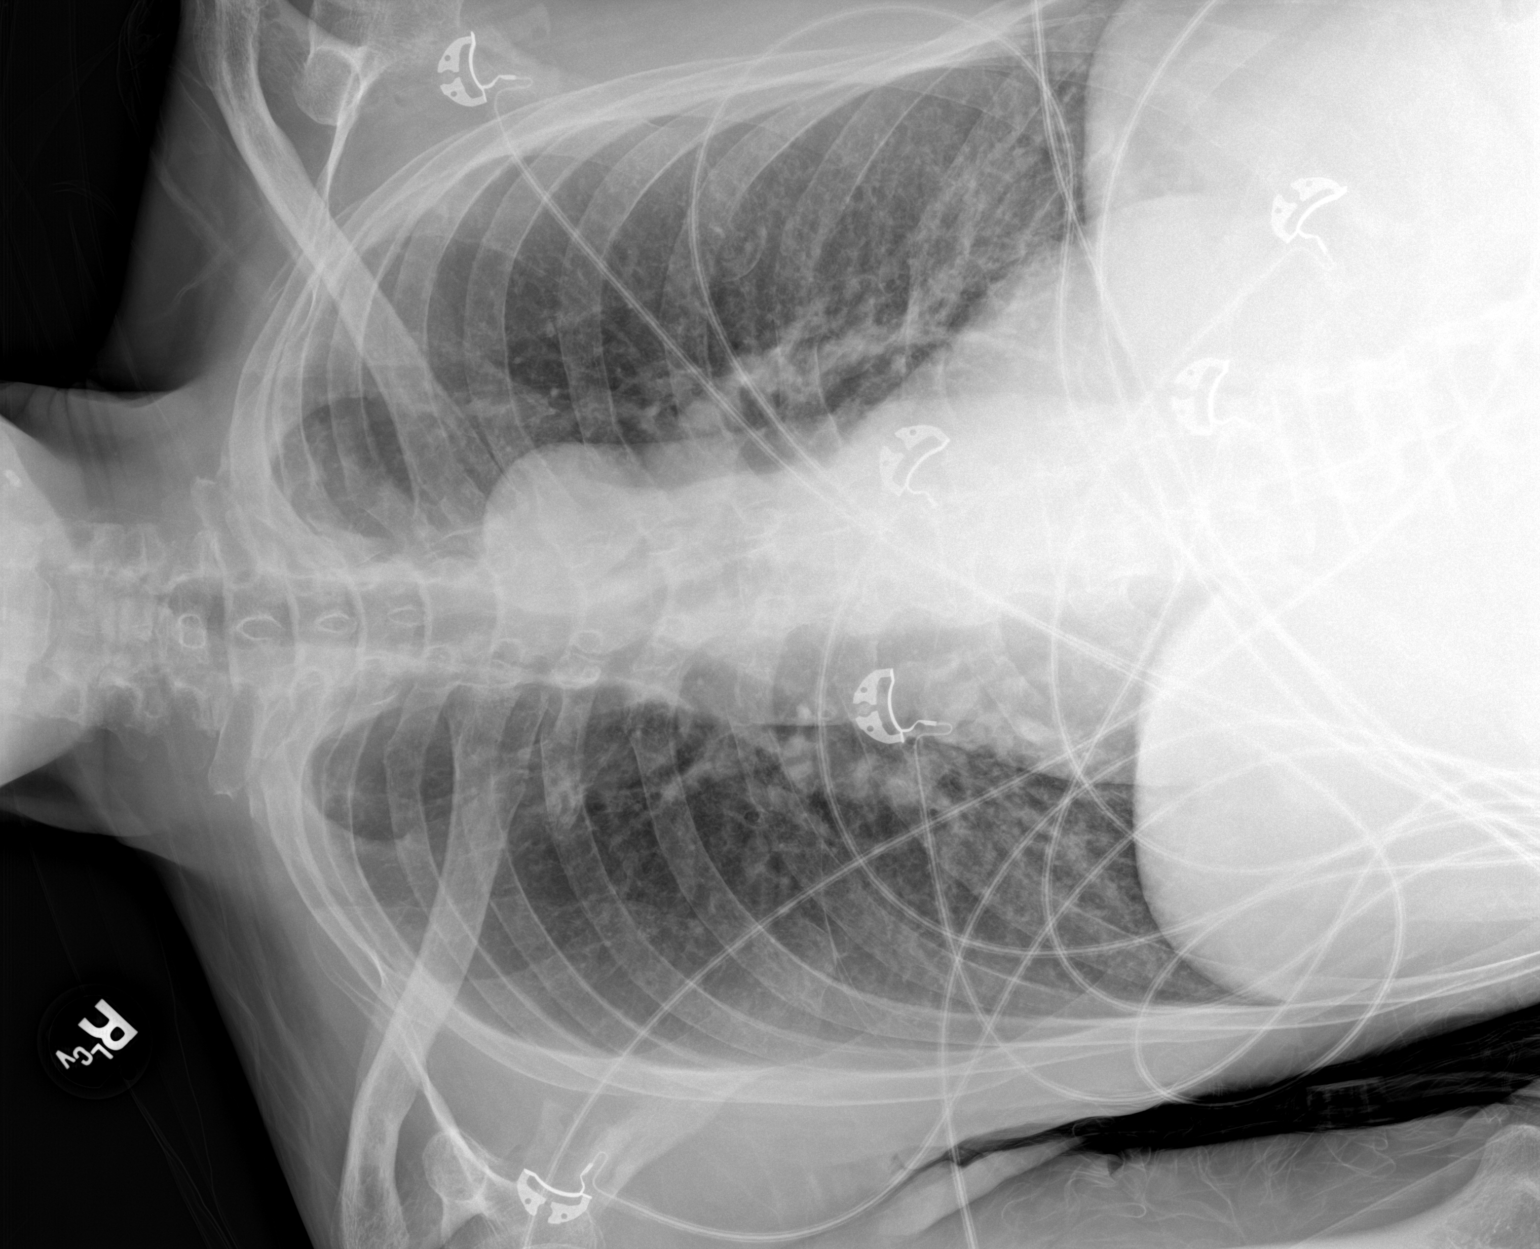

[chest ap (2 of 2)]
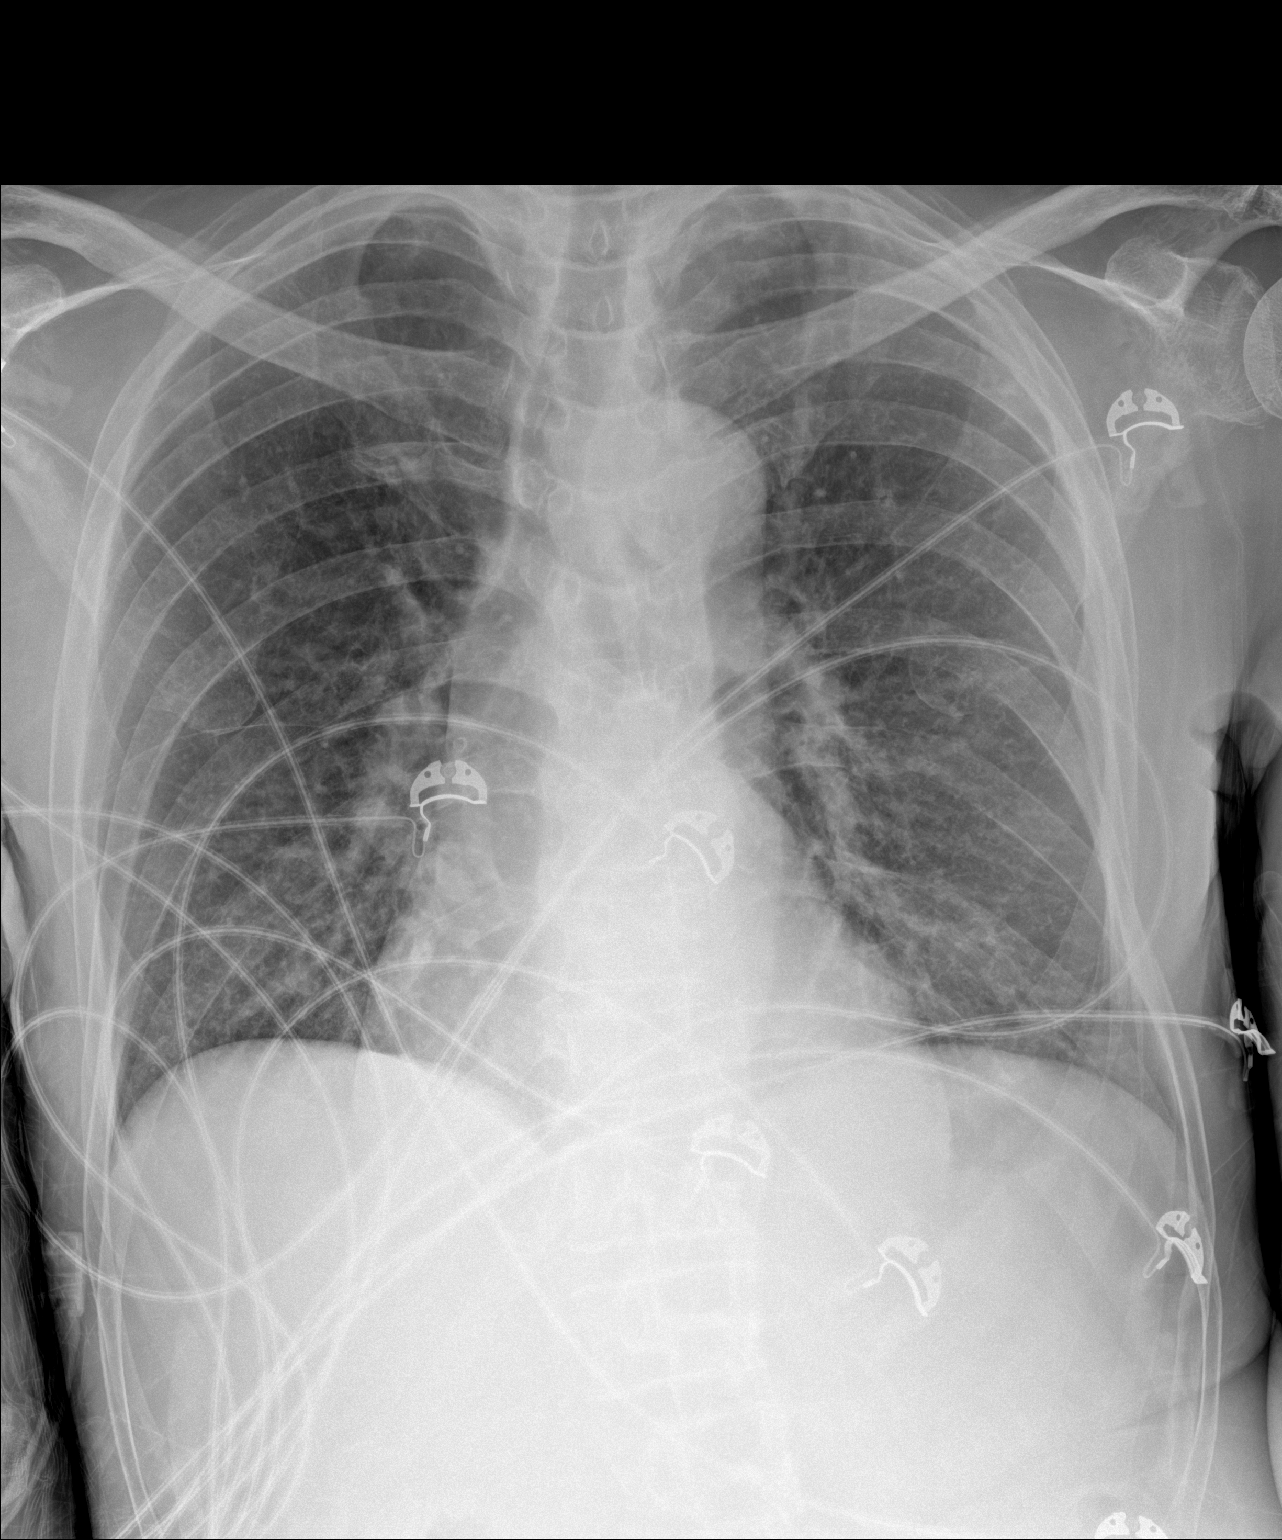

[2 of 2 positions shown; findings below may reference images not displayed]

FINDINGS: Normal heart size, mediastinal contours, and pulmonary vascularity.

Bronchitic changes with minimal biapical scarring.

Lungs otherwise clear.

No infiltrate, pleural effusion or pneumothorax.

No acute osseous findings.
IMPRESSION: Bronchitic changes and minimal biapical scarring.

No acute abnormalities.

## 2020-02-14 ENCOUNTER — Ambulatory Visit: Payer: No Typology Code available for payment source | Admitting: Family

## 2020-02-14 ENCOUNTER — Telehealth: Payer: Self-pay | Admitting: Family

## 2020-02-14 NOTE — Telephone Encounter (Signed)
Patient did not show for his [facility] appointment on 02/14/20. Will attempt to reschedule.

## 2020-02-15 ENCOUNTER — Telehealth: Payer: Self-pay | Admitting: Family

## 2020-02-15 NOTE — Telephone Encounter (Signed)
Unable to reach patient in attempt to reschedule a no show appointment from 2/10.   Katrina Brosh, NT

## 2020-03-16 ENCOUNTER — Other Ambulatory Visit: Payer: Self-pay

## 2020-03-16 ENCOUNTER — Inpatient Hospital Stay
Admission: EM | Admit: 2020-03-16 | Discharge: 2020-03-18 | DRG: 637 | Disposition: A | Discharge status: Home / Self Care | Payer: No Typology Code available for payment source | Attending: Internal Medicine | Admitting: Internal Medicine

## 2020-03-16 ENCOUNTER — Emergency Department: Payer: No Typology Code available for payment source

## 2020-03-16 DIAGNOSIS — E1165 Type 2 diabetes mellitus with hyperglycemia: Secondary | ICD-10-CM | POA: Diagnosis not present

## 2020-03-16 DIAGNOSIS — I10 Essential (primary) hypertension: Secondary | ICD-10-CM | POA: Diagnosis present

## 2020-03-16 DIAGNOSIS — Z833 Family history of diabetes mellitus: Secondary | ICD-10-CM

## 2020-03-16 DIAGNOSIS — E1122 Type 2 diabetes mellitus with diabetic chronic kidney disease: Secondary | ICD-10-CM | POA: Diagnosis present

## 2020-03-16 DIAGNOSIS — R197 Diarrhea, unspecified: Secondary | ICD-10-CM | POA: Diagnosis present

## 2020-03-16 DIAGNOSIS — G9341 Metabolic encephalopathy: Secondary | ICD-10-CM | POA: Diagnosis present

## 2020-03-16 DIAGNOSIS — E785 Hyperlipidemia, unspecified: Secondary | ICD-10-CM | POA: Diagnosis present

## 2020-03-16 DIAGNOSIS — I5042 Chronic combined systolic (congestive) and diastolic (congestive) heart failure: Secondary | ICD-10-CM | POA: Diagnosis present

## 2020-03-16 DIAGNOSIS — I5022 Chronic systolic (congestive) heart failure: Secondary | ICD-10-CM | POA: Diagnosis present

## 2020-03-16 DIAGNOSIS — E119 Type 2 diabetes mellitus without complications: Secondary | ICD-10-CM

## 2020-03-16 DIAGNOSIS — R680 Hypothermia, not associated with low environmental temperature: Secondary | ICD-10-CM | POA: Diagnosis present

## 2020-03-16 DIAGNOSIS — E860 Dehydration: Secondary | ICD-10-CM | POA: Diagnosis present

## 2020-03-16 DIAGNOSIS — Z794 Long term (current) use of insulin: Secondary | ICD-10-CM

## 2020-03-16 DIAGNOSIS — Z9114 Patient's other noncompliance with medication regimen: Secondary | ICD-10-CM

## 2020-03-16 DIAGNOSIS — F1010 Alcohol abuse, uncomplicated: Secondary | ICD-10-CM | POA: Diagnosis not present

## 2020-03-16 DIAGNOSIS — I639 Cerebral infarction, unspecified: Secondary | ICD-10-CM | POA: Diagnosis present

## 2020-03-16 DIAGNOSIS — R410 Disorientation, unspecified: Secondary | ICD-10-CM | POA: Diagnosis not present

## 2020-03-16 DIAGNOSIS — E876 Hypokalemia: Secondary | ICD-10-CM | POA: Diagnosis not present

## 2020-03-16 DIAGNOSIS — F17200 Nicotine dependence, unspecified, uncomplicated: Secondary | ICD-10-CM | POA: Diagnosis not present

## 2020-03-16 DIAGNOSIS — Z20822 Contact with and (suspected) exposure to covid-19: Secondary | ICD-10-CM | POA: Diagnosis present

## 2020-03-16 DIAGNOSIS — K219 Gastro-esophageal reflux disease without esophagitis: Secondary | ICD-10-CM | POA: Diagnosis present

## 2020-03-16 DIAGNOSIS — I130 Hypertensive heart and chronic kidney disease with heart failure and stage 1 through stage 4 chronic kidney disease, or unspecified chronic kidney disease: Secondary | ICD-10-CM | POA: Diagnosis present

## 2020-03-16 DIAGNOSIS — Z7982 Long term (current) use of aspirin: Secondary | ICD-10-CM

## 2020-03-16 DIAGNOSIS — I248 Other forms of acute ischemic heart disease: Secondary | ICD-10-CM | POA: Diagnosis not present

## 2020-03-16 DIAGNOSIS — I5023 Acute on chronic systolic (congestive) heart failure: Secondary | ICD-10-CM | POA: Diagnosis present

## 2020-03-16 DIAGNOSIS — Z79899 Other long term (current) drug therapy: Secondary | ICD-10-CM

## 2020-03-16 DIAGNOSIS — R9389 Abnormal findings on diagnostic imaging of other specified body structures: Secondary | ICD-10-CM | POA: Diagnosis not present

## 2020-03-16 DIAGNOSIS — T68XXXA Hypothermia, initial encounter: Secondary | ICD-10-CM | POA: Diagnosis present

## 2020-03-16 DIAGNOSIS — R778 Other specified abnormalities of plasma proteins: Secondary | ICD-10-CM | POA: Diagnosis present

## 2020-03-16 DIAGNOSIS — N179 Acute kidney failure, unspecified: Secondary | ICD-10-CM | POA: Diagnosis not present

## 2020-03-16 DIAGNOSIS — N1832 Chronic kidney disease, stage 3b: Secondary | ICD-10-CM | POA: Diagnosis present

## 2020-03-16 DIAGNOSIS — R7989 Other specified abnormal findings of blood chemistry: Secondary | ICD-10-CM | POA: Diagnosis present

## 2020-03-16 DIAGNOSIS — A0472 Enterocolitis due to Clostridium difficile, not specified as recurrent: Secondary | ICD-10-CM | POA: Diagnosis present

## 2020-03-16 DIAGNOSIS — F17210 Nicotine dependence, cigarettes, uncomplicated: Secondary | ICD-10-CM | POA: Diagnosis present

## 2020-03-16 DIAGNOSIS — Z8673 Personal history of transient ischemic attack (TIA), and cerebral infarction without residual deficits: Secondary | ICD-10-CM | POA: Diagnosis not present

## 2020-03-16 DIAGNOSIS — N189 Chronic kidney disease, unspecified: Secondary | ICD-10-CM | POA: Diagnosis present

## 2020-03-16 DIAGNOSIS — E1100 Type 2 diabetes mellitus with hyperosmolarity without nonketotic hyperglycemic-hyperosmolar coma (NKHHC): Principal | ICD-10-CM | POA: Diagnosis present

## 2020-03-16 DIAGNOSIS — E1111 Type 2 diabetes mellitus with ketoacidosis with coma: Secondary | ICD-10-CM | POA: Diagnosis not present

## 2020-03-16 DIAGNOSIS — R404 Transient alteration of awareness: Secondary | ICD-10-CM | POA: Diagnosis not present

## 2020-03-16 DIAGNOSIS — I428 Other cardiomyopathies: Secondary | ICD-10-CM | POA: Diagnosis present

## 2020-03-16 DIAGNOSIS — Z8249 Family history of ischemic heart disease and other diseases of the circulatory system: Secondary | ICD-10-CM | POA: Diagnosis not present

## 2020-03-16 LAB — BASIC METABOLIC PANEL
Anion gap: 18 — ABNORMAL HIGH (ref 5–15)
Anion gap: 17 — ABNORMAL HIGH (ref 5–15)
BUN: 82 mg/dL — ABNORMAL HIGH (ref 8–23)
BUN: 77 mg/dL — ABNORMAL HIGH (ref 8–23)
CO2: 23 mmol/L (ref 22–32)
CO2: 25 mmol/L (ref 22–32)
Calcium: 8.4 mg/dL — ABNORMAL LOW (ref 8.9–10.3)
Calcium: 8.7 mg/dL — ABNORMAL LOW (ref 8.9–10.3)
Chloride: 85 mmol/L — ABNORMAL LOW (ref 98–111)
Chloride: 88 mmol/L — ABNORMAL LOW (ref 98–111)
Creatinine, Ser: 2.73 mg/dL — ABNORMAL HIGH (ref 0.61–1.24)
Creatinine, Ser: 2.48 mg/dL — ABNORMAL HIGH (ref 0.61–1.24)
GFR, Estimated: 25 mL/min — ABNORMAL LOW (ref >60)
GFR, Estimated: 28 mL/min — ABNORMAL LOW (ref >60)
Glucose, Bld: 1446 mg/dL (ref 70–99)
Glucose, Bld: 1100 mg/dL (ref 70–99)
Potassium: 3.9 mmol/L (ref 3.5–5.1)
Potassium: 3.6 mmol/L (ref 3.5–5.1)
Sodium: 126 mmol/L — ABNORMAL LOW (ref 135–145)
Sodium: 130 mmol/L — ABNORMAL LOW (ref 135–145)

## 2020-03-16 LAB — CBC WITH DIFFERENTIAL/PLATELET
Abs Immature Granulocytes: 0.03 10*3/uL (ref 0.00–0.07)
Basophils Absolute: 0 10*3/uL (ref 0.0–0.1)
Basophils Relative: 1 %
Eosinophils Absolute: 0 10*3/uL (ref 0.0–0.5)
Eosinophils Relative: 0 %
HCT: 40.9 % (ref 39.0–52.0)
Hemoglobin: 11.4 g/dL — ABNORMAL LOW (ref 13.0–17.0)
Immature Granulocytes: 1 %
Lymphocytes Relative: 10 %
Lymphs Abs: 0.6 10*3/uL — ABNORMAL LOW (ref 0.7–4.0)
MCH: 29 pg (ref 26.0–34.0)
MCHC: 27.9 g/dL — ABNORMAL LOW (ref 30.0–36.0)
MCV: 104.1 fL — ABNORMAL HIGH (ref 80.0–100.0)
Monocytes Absolute: 0.3 10*3/uL (ref 0.1–1.0)
Monocytes Relative: 5 %
Neutro Abs: 5.5 10*3/uL (ref 1.7–7.7)
Neutrophils Relative %: 83 %
Platelets: 356 10*3/uL (ref 150–400)
RBC: 3.93 MIL/uL — ABNORMAL LOW (ref 4.22–5.81)
RDW: 15 % (ref 11.5–15.5)
WBC: 6.5 10*3/uL (ref 4.0–10.5)
nRBC: 0 % (ref 0.0–0.2)

## 2020-03-16 LAB — URINE DRUG SCREEN, QUALITATIVE (ARMC ONLY)
Amphetamines, Ur Screen: NOT DETECTED
Barbiturates, Ur Screen: NOT DETECTED
Benzodiazepine, Ur Scrn: NOT DETECTED
Cannabinoid 50 Ng, Ur ~~LOC~~: NOT DETECTED
Cocaine Metabolite,Ur ~~LOC~~: NOT DETECTED
MDMA (Ecstasy)Ur Screen: NOT DETECTED
Methadone Scn, Ur: NOT DETECTED
Opiate, Ur Screen: NOT DETECTED
Phencyclidine (PCP) Ur S: NOT DETECTED
Tricyclic, Ur Screen: NOT DETECTED

## 2020-03-16 LAB — COMPREHENSIVE METABOLIC PANEL
ALT: 16 U/L (ref 0–44)
AST: 20 U/L (ref 15–41)
Albumin: 3.1 g/dL — ABNORMAL LOW (ref 3.5–5.0)
Alkaline Phosphatase: 108 U/L (ref 38–126)
Anion gap: 19 — ABNORMAL HIGH (ref 5–15)
BUN: 80 mg/dL — ABNORMAL HIGH (ref 8–23)
CO2: 22 mmol/L (ref 22–32)
Calcium: 8.2 mg/dL — ABNORMAL LOW (ref 8.9–10.3)
Chloride: 81 mmol/L — ABNORMAL LOW (ref 98–111)
Creatinine, Ser: 2.67 mg/dL — ABNORMAL HIGH (ref 0.61–1.24)
GFR, Estimated: 26 mL/min — ABNORMAL LOW (ref >60)
Glucose, Bld: 1862 mg/dL (ref 70–99)
Potassium: 4.1 mmol/L (ref 3.5–5.1)
Sodium: 122 mmol/L — ABNORMAL LOW (ref 135–145)
Total Bilirubin: 1.1 mg/dL (ref 0.3–1.2)
Total Protein: 7 g/dL (ref 6.5–8.1)

## 2020-03-16 LAB — BLOOD GAS, VENOUS
Acid-base deficit: 6.6 mmol/L — ABNORMAL HIGH (ref 0.0–2.0)
Bicarbonate: 20.6 mmol/L (ref 20.0–28.0)
O2 Saturation: 81.6 %
Patient temperature: 37
pCO2, Ven: 47 mmHg (ref 44.0–60.0)
pH, Ven: 7.25 (ref 7.250–7.430)
pO2, Ven: 54 mmHg — ABNORMAL HIGH (ref 32.0–45.0)

## 2020-03-16 LAB — CBG MONITORING, ED
Glucose-Capillary: 575 mg/dL (ref 70–99)
Glucose-Capillary: >600 mg/dL (ref 70–99)
Glucose-Capillary: >600 mg/dL (ref 70–99)
Glucose-Capillary: >600 mg/dL (ref 70–99)
Glucose-Capillary: >600 mg/dL (ref 70–99)
Glucose-Capillary: >600 mg/dL (ref 70–99)
Glucose-Capillary: >600 mg/dL (ref 70–99)
Glucose-Capillary: >600 mg/dL (ref 70–99)
Glucose-Capillary: >600 mg/dL (ref 70–99)
Glucose-Capillary: >600 mg/dL (ref 70–99)
Glucose-Capillary: >600 mg/dL (ref 70–99)
Glucose-Capillary: >600 mg/dL (ref 70–99)
Glucose-Capillary: >600 mg/dL (ref 70–99)
Glucose-Capillary: >600 mg/dL (ref 70–99)
Glucose-Capillary: >600 mg/dL (ref 70–99)

## 2020-03-16 LAB — PROCALCITONIN: Procalcitonin: 1.27 ng/mL

## 2020-03-16 LAB — TROPONIN I (HIGH SENSITIVITY)
Troponin I (High Sensitivity): 48 ng/L — ABNORMAL HIGH (ref <18)
Troponin I (High Sensitivity): 50 ng/L — ABNORMAL HIGH (ref <18)
Troponin I (High Sensitivity): 48 ng/L — ABNORMAL HIGH (ref <18)
Troponin I (High Sensitivity): 50 ng/L — ABNORMAL HIGH (ref <18)
Troponin I (High Sensitivity): 57 ng/L — ABNORMAL HIGH (ref <18)

## 2020-03-16 LAB — URINALYSIS, COMPLETE (UACMP) WITH MICROSCOPIC
Bilirubin Urine: NEGATIVE
Glucose, UA: >=500 mg/dL — AB
Ketones, ur: NEGATIVE mg/dL
Leukocytes,Ua: NEGATIVE
Nitrite: NEGATIVE
Protein, ur: 100 mg/dL — AB
Specific Gravity, Urine: 1.021 (ref 1.005–1.030)
Squamous Epithelial / HPF: NONE SEEN (ref 0–5)
pH: 5 (ref 5.0–8.0)

## 2020-03-16 LAB — BRAIN NATRIURETIC PEPTIDE: B Natriuretic Peptide: 1104.6 pg/mL — ABNORMAL HIGH (ref 0.0–100.0)

## 2020-03-16 LAB — MAGNESIUM: Magnesium: 2.3 mg/dL (ref 1.7–2.4)

## 2020-03-16 LAB — BETA-HYDROXYBUTYRIC ACID: Beta-Hydroxybutyric Acid: 4.27 mmol/L — ABNORMAL HIGH (ref 0.05–0.27)

## 2020-03-16 LAB — RESP PANEL BY RT-PCR (FLU A&B, COVID) ARPGX2
Influenza A by PCR: NEGATIVE
Influenza B by PCR: NEGATIVE
SARS Coronavirus 2 by RT PCR: NEGATIVE

## 2020-03-16 LAB — OSMOLALITY: Osmolality: 398 mOsm/kg (ref 275–295)

## 2020-03-16 MED ORDER — LORAZEPAM 2 MG/ML IJ SOLN: 1.0000 mg | INTRAMUSCULAR | Status: DC | PRN

## 2020-03-16 MED ORDER — PANTOPRAZOLE SODIUM 40 MG PO TBEC
40.0000 mg | DELAYED_RELEASE_TABLET | Freq: Every day | ORAL | Status: DC
  Administered 2020-03-17 – 2020-03-18 (×2): 40 mg via ORAL
  Filled 2020-03-16 (×2): qty 1

## 2020-03-16 MED ORDER — INSULIN REGULAR(HUMAN) IN NACL 100-0.9 UT/100ML-% IV SOLN: INTRAVENOUS | Status: DC

## 2020-03-16 MED ORDER — ATORVASTATIN CALCIUM 20 MG PO TABS
20.0000 mg | ORAL_TABLET | Freq: Every day | ORAL | Status: DC
  Administered 2020-03-17 – 2020-03-18 (×2): 20 mg via ORAL
  Filled 2020-03-16 (×2): qty 1

## 2020-03-16 MED ORDER — LACTATED RINGERS IV BOLUS
1000.0000 mL | Freq: Once | INTRAVENOUS | Status: AC
  Administered 2020-03-16: 1000 mL via INTRAVENOUS

## 2020-03-16 MED ORDER — INSULIN REGULAR(HUMAN) IN NACL 100-0.9 UT/100ML-% IV SOLN
INTRAVENOUS | Status: DC
  Administered 2020-03-16 (×2): 7.5 [IU]/h via INTRAVENOUS
  Filled 2020-03-16 (×2): qty 100

## 2020-03-16 MED ORDER — ACETAMINOPHEN 650 MG RE SUPP: 650.0000 mg | Freq: Four times a day (QID) | RECTAL | Status: DC | PRN

## 2020-03-16 MED ORDER — DEXTROSE IN LACTATED RINGERS 5 % IV SOLN: INTRAVENOUS | Status: DC

## 2020-03-16 MED ORDER — DEXTROSE 50 % IV SOLN: 0.0000 mL | INTRAVENOUS | Status: DC | PRN

## 2020-03-16 MED ORDER — ADULT MULTIVITAMIN W/MINERALS CH
1.0000 | ORAL_TABLET | Freq: Every day | ORAL | Status: DC
  Administered 2020-03-17 – 2020-03-18 (×2): 1 via ORAL
  Filled 2020-03-16 (×2): qty 1

## 2020-03-16 MED ORDER — DM-GUAIFENESIN ER 30-600 MG PO TB12: 1.0000 | ORAL_TABLET | Freq: Two times a day (BID) | ORAL | Status: DC | PRN

## 2020-03-16 MED ORDER — LACTATED RINGERS IV SOLN: INTRAVENOUS | Status: DC

## 2020-03-16 MED ORDER — ENOXAPARIN SODIUM 30 MG/0.3ML ~~LOC~~ SOLN
30.0000 mg | SUBCUTANEOUS | Status: DC
  Administered 2020-03-17: 30 mg via SUBCUTANEOUS
  Filled 2020-03-16 (×3): qty 0.3

## 2020-03-16 MED ORDER — HYDRALAZINE HCL 20 MG/ML IJ SOLN: 5.0000 mg | INTRAMUSCULAR | Status: DC | PRN

## 2020-03-16 MED ORDER — THIAMINE HCL 100 MG PO TABS
100.0000 mg | ORAL_TABLET | Freq: Every day | ORAL | Status: DC
  Administered 2020-03-17 – 2020-03-18 (×2): 100 mg via ORAL
  Filled 2020-03-16 (×2): qty 1

## 2020-03-16 MED ORDER — SODIUM CHLORIDE 0.9 % IV SOLN
1.0000 g | Freq: Once | INTRAVENOUS | Status: AC
  Administered 2020-03-16: 1 g via INTRAVENOUS
  Filled 2020-03-16: qty 10

## 2020-03-16 MED ORDER — LACTATED RINGERS IV BOLUS: 20.0000 mL/kg | Freq: Once | INTRAVENOUS | Status: DC

## 2020-03-16 MED ORDER — FOLIC ACID 1 MG PO TABS
1.0000 mg | ORAL_TABLET | Freq: Every day | ORAL | Status: DC
  Administered 2020-03-17 – 2020-03-18 (×2): 1 mg via ORAL
  Filled 2020-03-16 (×2): qty 1

## 2020-03-16 MED ORDER — LORAZEPAM 2 MG/ML IJ SOLN: 0.0000 mg | Freq: Four times a day (QID) | INTRAMUSCULAR | Status: AC

## 2020-03-16 MED ORDER — LORAZEPAM 2 MG/ML IJ SOLN: 0.0000 mg | Freq: Two times a day (BID) | INTRAMUSCULAR | Status: DC

## 2020-03-16 MED ORDER — VITAMIN D3 1.25 MG (50000 UT) PO TABS: 1.0000 | ORAL_TABLET | Freq: Every day | ORAL | Status: DC

## 2020-03-16 MED ORDER — THIAMINE HCL 100 MG/ML IJ SOLN
100.0000 mg | Freq: Every day | INTRAMUSCULAR | Status: DC
  Administered 2020-03-16: 100 mg via INTRAVENOUS
  Filled 2020-03-16: qty 2

## 2020-03-16 MED ORDER — LORAZEPAM 1 MG PO TABS: 1.0000 mg | ORAL_TABLET | ORAL | Status: DC | PRN

## 2020-03-16 MED ORDER — ACETAMINOPHEN 325 MG PO TABS: 650.0000 mg | ORAL_TABLET | Freq: Four times a day (QID) | ORAL | Status: DC | PRN

## 2020-03-16 MED ORDER — NICOTINE 21 MG/24HR TD PT24
21.0000 mg | MEDICATED_PATCH | Freq: Every day | TRANSDERMAL | Status: DC
  Administered 2020-03-17 – 2020-03-18 (×2): 21 mg via TRANSDERMAL
  Filled 2020-03-16 (×2): qty 1

## 2020-03-16 MED ORDER — ONDANSETRON HCL 4 MG/2ML IJ SOLN: 4.0000 mg | Freq: Three times a day (TID) | INTRAMUSCULAR | Status: DC | PRN

## 2020-03-16 MED ORDER — ALBUTEROL SULFATE HFA 108 (90 BASE) MCG/ACT IN AERS
2.0000 | INHALATION_SPRAY | RESPIRATORY_TRACT | Status: DC | PRN
  Filled 2020-03-16: qty 6.7

## 2020-03-16 MED ORDER — POTASSIUM CHLORIDE 10 MEQ/100ML IV SOLN
10.0000 meq | INTRAVENOUS | Status: AC
  Administered 2020-03-16 (×2): 10 meq via INTRAVENOUS
  Filled 2020-03-16 (×2): qty 100

## 2020-03-16 MED ORDER — B COMPLEX-C PO TABS
1.0000 | ORAL_TABLET | Freq: Every day | ORAL | Status: DC
  Administered 2020-03-17 – 2020-03-18 (×2): 1 via ORAL
  Filled 2020-03-16 (×3): qty 1

## 2020-03-16 MED ORDER — ASPIRIN 81 MG PO CHEW
81.0000 mg | CHEWABLE_TABLET | Freq: Every day | ORAL | Status: DC
  Administered 2020-03-17 – 2020-03-18 (×2): 81 mg via ORAL
  Filled 2020-03-16 (×2): qty 1

## 2020-03-16 NOTE — Progress Notes (Signed)
PHARMACIST - PHYSICIAN COMMUNICATION  CONCERNING:  Enoxaparin (Lovenox) for DVT Prophylaxis    RECOMMENDATION: Patient was prescribed enoxaprin '40mg'$  q24 hours for VTE prophylaxis.   Filed Weights   03/16/20 1005  Weight: 60.2 kg (132 lb 11.5 oz)    Body mass index is 18 kg/m.  Estimated Creatinine Clearance: 24.1 mL/min (A) (by C-G formula based on SCr of 2.67 mg/dL (H)).   Patient is candidate for enoxaparin '30mg'$  every 24 hours based on CrCl <16m/min or Weight <45kg  DESCRIPTION: [facility]  Patient is now receiving enoxaparin 30 mg every 24 hours    SSherilyn Banker PharmD Pharmacy Resident  03/16/2020 12:47 PM

## 2020-03-16 NOTE — H&P (Signed)
History and Physical    Shawn [name] [id number] DOB: 11-15-1955 DOA: 03/16/2020  Referring MD/NP/PA:   PCP: [facility] [place]   Patient coming from:  The patient is coming from home.  At baseline, pt is independent for most of ADL.        Chief Complaint: Weakness, confusion, elevated blood sugar  HPI: Shawn [name] is a 65 y.o. male with medical history significant of diabetes mellitus, DKA, HHS, hypertension, hyperlipidemia, stroke, GERD, polysubstance abuse, alcohol abuse, tobacco abuse, CHF with EF of 25-30%, CKD stage IIIb, who presents with elevated blood sugar, weakness and confusion.  Per his sister, pt has generalized weakness recently, he become confused in the past 2 days.  Sister is injecting insulin to patient, but blood sugar is still very elevated.  Patient has diarrhea in the past 2 days, 3-4 times each day with loose stool.  No nausea vomiting, abdominal pain.  Patient has cough with clear mucus production, no shortness of breath or chest pain.  No fever or chills.  No symptoms of UTI.  Patient moves all extremities, no facial droop or slurred speech.  ED Course: pt was found to have blood sugar 1862, beta hydroxybutyric acid 4.27, bicarbonate 22, anion gap 19, ABG with pH 7.25, CO2 47 and O2 54, troponin level 48, 50, BNP 1104, negative UDS, urinalysis negative for UTI and ketone, negative Covid PCR, pseudohyponatremia, worsening renal function, hypothermia with temperature 93, blood pressure 150/75, heart rate is 73, RR 19, oxygen saturation 92-93% on room air.  CT of head is negative for acute intracranial abnormalities.  CT of x-ray showed widespread airspace opacity, particularly in the left lung.  Chest x-ray showed: Widespread airspace opacity throughout much of the left lung, most likely due to multifocal pneumonia. Small left pleural effusion. Right lung clear. Stable cardiac silhouette.  CT-head: Mild atrophy with mild periventricular small  vessel disease. Decreased attenuation in each inferior frontal lobe is consistent with encephalomalacia, a stable finding. Question previous trauma as cause of this appearance. No mass, hemorrhage, or extra-axial fluid collection. No demonstrable acute infarct. There are foci of arterial vascular calcification.  Review of Systems: Could not be reviewed accurately due to altered mental status  Allergy: No Known Allergies  Past Medical History:  Diagnosis Date  . Chronic combined systolic (congestive) and diastolic (congestive) heart failure ([place])    a. TTE 6/15: EF < 20%, mildly dilated LV, DD, mildly dilated LA, mod dilated RA, mild MR, mild to mod TR, mod increased posterior wall thickness, elevated LA and LVEDP; b. 4/12019 Echo: EF 20-25%, diff HK. Gr1 DD, nl RV fxn.  . CKD (chronic kidney disease), stage II   . Diabetes mellitus with complication ([place])    a. Prior admissions w/ DKA (last 12/2017).  . Essential hypertension   . NICM (nonischemic cardiomyopathy) ([place])    a. 06/2013 Echo: EF<20%; b. 06/2013 Cath: no signif dzs; c. 04/2017 Echo: 20-25%, gr1 DD.; d.05/2018 Echo: 25-30%  . Polysubstance abuse ([place])    a. etoh and tobacco  . Stroke [facility])     Past Surgical History:  Procedure Laterality Date  . NO PAST SURGERIES      Social History:  reports that he has been smoking cigarettes. He has been smoking about 0.50 packs per day. He has never used smokeless tobacco. He reports previous alcohol use. No history on file for drug use.  Family History:  Family History  Problem Relation Age of Onset  . Congestive Heart  Failure Mother   . Diabetes Mother   . Congestive Heart Failure Brother      Prior to Admission medications   Medication Sig Start Date End Date Taking? Authorizing Provider  aspirin 81 MG chewable tablet Chew 1 tablet (81 mg total) by mouth daily. 04/30/17   Demetrios Loll, MD  atorvastatin (LIPITOR) 20 MG tablet Take 20 mg by mouth daily.    [provider]   B-Complex-C TABS Take 1 tablet by mouth daily.    [provider]  cetirizine (ZYRTEC) 10 MG tablet Take 10 mg by mouth daily.    [provider]  dapagliflozin propanediol (FARXIGA) 10 MG TABS tablet Take 10 mg by mouth daily before breakfast. 05/15/19   Alisa Graff, FNP  docusate sodium (COLACE) 100 MG capsule Take 100 mg by mouth 2 (two) times daily.    [provider]  ENTRESTO 24-26 MG TAKE 1 TABLET BY MOUTH TWICE A DAY 09/14/19   Darylene Price A, FNP  Exenatide ER 2 MG PEN Inject 2 mg into the skin once a week.    [provider]  insulin aspart (NOVOLOG) 100 UNIT/ML injection Inject 5 Units into the skin 3 (three) times daily with meals.    [provider]  insulin glargine (LANTUS) 100 UNIT/ML injection Inject 15-20 Units into the skin at bedtime.     [provider]  omeprazole (PRILOSEC) 20 MG capsule Take 20 mg by mouth daily. 12/18/18   [provider]  polyethylene glycol (MIRALAX / GLYCOLAX) 17 g packet Take 17 g by mouth daily as needed. Patient not taking: Reported on 08/14/2019 05/01/19   Mercy Riding, MD  torsemide (DEMADEX) 20 MG tablet Take 20 mg by mouth daily as needed.    [provider]    Physical Exam: Vitals:   03/16/20 1300 03/16/20 1500 03/16/20 1518 03/16/20 1600  BP:  (!) 146/76 (!) 146/76 140/70  Pulse:  69 71 69  Resp:  16  17  Temp: (!) 93 F (33.9 C)     TempSrc: Rectal     SpO2:  95%  93%  Weight:      Height:       General: Not in acute distress HEENT:       Eyes: PERRL, EOMI, no scleral icterus.       ENT: No discharge from the ears and nose       Neck: No JVD, no bruit, no mass felt. Heme: No neck lymph node enlargement. Cardiac: S1/S2, RRR, No murmurs, No gallops or rubs. Respiratory: No rales, wheezing, rhonchi or rubs. GI: Soft, nondistended, nontender, no organomegaly, BS present. GU: No hematuria Ext: No pitting leg edema bilaterally. 1+DP/PT pulse  bilaterally. Musculoskeletal: No joint deformities, No joint redness or warmth, no limitation of ROM in spin. Skin: No rashes.  Neuro: Confused, not following command, knows his own name, not orientated to time and place, oriented X3, cranial nerves II-XII grossly intact, moves all extremities  Psych: Patient is not psychotic, no suicidal or hemocidal ideation.  Labs on Admission: I have personally reviewed following labs and imaging studies  CBC: Recent Labs  Lab 03/16/20 1008  WBC 6.5  NEUTROABS 5.5  HGB 11.4*  HCT 40.9  MCV 104.1*  PLT [id number]   Basic Metabolic Panel: Recent Labs  Lab 03/16/20 1008  NA 122*  K 4.1  CL 81*  CO2 22  GLUCOSE 1,862*  BUN 80*  CREATININE 2.67*  CALCIUM 8.2*  MG 2.3  GFR: Estimated Creatinine Clearance: 24.1 mL/min (A) (by C-G formula based on SCr of 2.67 mg/dL (H)). Liver Function Tests: Recent Labs  Lab 03/16/20 1008  AST 20  ALT 16  ALKPHOS 108  BILITOT 1.1  PROT 7.0  ALBUMIN 3.1*   No results for input(s): LIPASE, AMYLASE in the last 168 hours. No results for input(s): AMMONIA in the last 168 hours. Coagulation Profile: No results for input(s): INR, PROTIME in the last 168 hours. Cardiac Enzymes: No results for input(s): CKTOTAL, CKMB, CKMBINDEX, TROPONINI in the last 168 hours. BNP (last 3 results) No results for input(s): PROBNP in the last 8760 hours. HbA1C: No results for input(s): HGBA1C in the last 72 hours. CBG: Recent Labs  Lab 03/16/20 0956 03/16/20 1250 03/16/20 1338 03/16/20 1456 03/16/20 1559  GLUCAP >600* >600* >600* >600* >600*   Lipid Profile: No results for input(s): CHOL, HDL, LDLCALC, TRIG, CHOLHDL, LDLDIRECT in the last 72 hours. Thyroid Function Tests: No results for input(s): TSH, T4TOTAL, FREET4, T3FREE, THYROIDAB in the last 72 hours. Anemia Panel: No results for input(s): VITAMINB12, FOLATE, FERRITIN, TIBC, IRON, RETICCTPCT in the last 72 hours. Urine analysis:    Component Value  Date/Time   COLORURINE YELLOW (A) 03/16/2020 1330   APPEARANCEUR HAZY (A) 03/16/2020 1330   LABSPEC 1.021 03/16/2020 1330   PHURINE 5.0 03/16/2020 1330   GLUCOSEU >=500 (A) 03/16/2020 1330   HGBUR SMALL (A) 03/16/2020 1330   BILIRUBINUR NEGATIVE 03/16/2020 1330   KETONESUR NEGATIVE 03/16/2020 1330   PROTEINUR 100 (A) 03/16/2020 1330   NITRITE NEGATIVE 03/16/2020 1330   LEUKOCYTESUR NEGATIVE 03/16/2020 1330   Sepsis Labs: '@LABRCNTIP'$ (procalcitonin:4,lacticidven:4) ) Recent Results (from the past 240 hour(s))  Resp Panel by RT-PCR (Flu A&B, Covid) Nasopharyngeal Swab     Status: None   Collection Time: 03/16/20 12:54 PM   Specimen: Nasopharyngeal Swab; Nasopharyngeal(NP) swabs in vial transport medium  Result Value Ref Range Status   SARS Coronavirus 2 by RT PCR NEGATIVE NEGATIVE Final    Comment: (NOTE) SARS-CoV-2 target nucleic acids are NOT DETECTED.  The SARS-CoV-2 RNA is generally detectable in upper respiratory specimens during the acute phase of infection. The lowest concentration of SARS-CoV-2 viral copies this assay can detect is 138 copies/mL. A negative result does not preclude SARS-Cov-2 infection and should not be used as the sole basis for treatment or other patient management decisions. A negative result may occur with  improper specimen collection/handling, submission of specimen other than nasopharyngeal swab, presence of viral mutation(s) within the areas targeted by this assay, and inadequate number of viral copies(<138 copies/mL). A negative result must be combined with clinical observations, patient history, and epidemiological information. The expected result is Negative.  Fact Sheet for Patients:  EntrepreneurPulse.com.au  Fact Sheet for Healthcare Providers:  IncredibleEmployment.be  This test is no t yet approved or cleared by the [place] FDA and  has been authorized for detection and/or diagnosis of  SARS-CoV-2 by FDA under an Emergency Use Authorization (EUA). This EUA will remain  in effect (meaning this test can be used) for the duration of the COVID-19 declaration under Section 564(b)(1) of the Act, 21 U.S.C.section 360bbb-3(b)(1), unless the authorization is terminated  or revoked sooner.       Influenza A by PCR NEGATIVE NEGATIVE Final   Influenza B by PCR NEGATIVE NEGATIVE Final    Comment: (NOTE) The Xpert Xpress SARS-CoV-2/FLU/RSV plus assay is intended as an aid in the diagnosis of influenza from Nasopharyngeal swab specimens and should not be used  as a sole basis for treatment. Nasal washings and aspirates are unacceptable for Xpert Xpress SARS-CoV-2/FLU/RSV testing.  Fact Sheet for Patients: EntrepreneurPulse.com.au  Fact Sheet for Healthcare Providers: IncredibleEmployment.be  This test is not yet approved or cleared by the [place] FDA and has been authorized for detection and/or diagnosis of SARS-CoV-2 by FDA under an Emergency Use Authorization (EUA). This EUA will remain in effect (meaning this test can be used) for the duration of the COVID-19 declaration under Section 564(b)(1) of the Act, 21 U.S.C. section 360bbb-3(b)(1), unless the authorization is terminated or revoked.  Performed at [facility], [address] [place], [place] [address]      Radiological Exams on Admission: CT Head Wo Contrast  Result Date: 03/16/2020 CLINICAL DATA:  Altered mental status/confusion EXAM: CT HEAD WITHOUT CONTRAST TECHNIQUE: Contiguous axial images were obtained from the base of the skull through the vertex without intravenous contrast. COMPARISON:  March 21, 2018 FINDINGS: Brain: There is mild diffuse atrophy, stable. There is no appreciable intracranial mass, hemorrhage, extra-axial fluid collection, or midline shift. Decreased attenuation is noted in each inferior frontal lobe, a stable finding. There is mild  decreased attenuation in the centra semiovale bilaterally. No acute appearing infarct evident. Vascular: No hyperdense vessel. There is calcification in each carotid siphon region. Skull: The bony calvarium appears intact. Sinuses/Orbits: Visualized paranasal sinuses are clear. Visualized orbits appear symmetric bilaterally. Other: Visualized mastoid air cells are clear. IMPRESSION: Mild atrophy with mild periventricular small vessel disease. Decreased attenuation in each inferior frontal lobe is consistent with encephalomalacia, a stable finding. Question previous trauma as cause of this appearance. No mass, hemorrhage, or extra-axial fluid collection. No demonstrable acute infarct. There are foci of arterial vascular calcification. Electronically Signed   By: Lowella Grip III M.D.   On: 03/16/2020 11:18   DG Chest Portable 1 View  Result Date: 03/16/2020 CLINICAL DATA:  Hypoxia EXAM: PORTABLE CHEST 1 VIEW COMPARISON:  January 18, 2019 FINDINGS: There is airspace opacity throughout much of the left lung with small left pleural effusion. The right lung is clear. Heart is upper normal in size with pulmonary vascularity normal. No adenopathy. There is degenerative type change in each shoulder. IMPRESSION: Widespread airspace opacity throughout much of the left lung, most likely due to multifocal pneumonia. Small left pleural effusion. Right lung clear. Stable cardiac silhouette. Electronically Signed   By: Lowella Grip III M.D.   On: 03/16/2020 10:29     EKG: I have personally reviewed.  Sinus rhythm, QTC 473, mild T wave inversion in inferior leads  Assessment/Plan Principal Problem:   Hyperosmolar hyperglycemic state (HHS) ([place]) Active Problems:   Type 2 diabetes mellitus with stage 3b chronic kidney disease, with long-term current use of insulin ([name])   Smoker   Alcohol abuse   Chronic systolic CHF (congestive heart failure) ([name])   Essential hypertension   Acute renal failure  superimposed on stage 3b chronic kidney disease ([name])   Diarrhea   Stroke ([name])   GERD (gastroesophageal reflux disease)   Acute metabolic encephalopathy   Elevated troponin   HLD (hyperlipidemia)   Abnormal CXR   Hypothermia   Hyperosmolar hyperglycemic state (HHS) ([place]): Blood sugar 1862.  Patient is confused with altered mental status.  Unclear etiology.  Patient sister states that she has been giving insulin to patient.  May be related to diarrhea and dehydration.  Patient may have early stage of DKA, anion gap 19, beta hydroxybutyric acid 4.27, but VBG with pH 7.25, urinalysis  negative for ketones.  Patient has CHF with EF of 25-30%.  BNP is elevated 1104, limiting aggressive IV fluid resuscitation.  Need to be treated carefully with IV fluid to avoid CHF exacerbation  - Admit to stepdown  - 1L of LR bolus - insulin gtt with BMP q4h - IVF: LR at 75 cc/h, will switch to D5-LR at 75 cc/h when CBG<250 - replete K as needed - Zofran prn nausea  - NPO  - consult to diabetic educator and case manager  Type 2 diabetes mellitus with stage 3b chronic kidney disease, with long-term current use of insulin ([place]): Recent A1c 11.0, poorly controlled.  Patient is on NovoLog, exenatide, Farxiga, Lantus at home, not sure if patient is taking medication correctly -Consult diabetic educator  Smoker -Nicotine patch  Alcohol abuse -CIWA protocol  Chronic systolic CHF (congestive heart failure) ([place]): 2D echo on 05/11/2018 showed EF 25-30%.  BNP is elevated at 1104, but patient does not have leg edema, does not seem to have CHF exacerbation. -Hold diuretics due to HHS -hold Entresto due to renal function  Essential hypertension -IV hydralazine as needed -Hold home diuretics, torsemide and spironolactone -Hold Entresto due to worsening renal function  Acute renal failure superimposed on stage 3b chronic kidney disease ([place]): Baseline creatinine 1.16 on 06/20/2019.  His creatinine is at 2.67, BUN  80, likely due to dehydration and prerenal, and continuation of diuretics and Entresto -Hold diuretics and Entresto -IV fluid as above  Diarrhea -Check C. difficile PCR and GI pathogen panel  Stroke ([name]) -Continue aspirin, Lipitor  GERD (gastroesophageal reflux disease) -Protonix  Acute metabolic encephalopathy: Likely multifactorial etiology, including HHS, worsening renal function.  CT head is negative for acute intracranial abnormalities.  No focal neuro deficit on examination. -Frequent neuro check -check UDS  Elevated troponin: No chest pain troponin level 48, 50, 48, in the setting of worsening CKD-3b.  Likely due to demand ischemia and decreased clearance of troponin. -Trend troponin -Aspirin, Lipitor -Check A1c, FLP  HLD (hyperlipidemia) -Lipitor  Abnormal CXR: Chest x-ray showed multifocal infiltration particularly on the left side.  Patient does not have leukocytosis.  Clinically does not seem to have pneumonia.  May be due to pulmonary edema given very low EF 25-30%.  Patient received 1 dose of Rocephin in ED.  Will hold off antibiotics now.  Check procalcitonin level -Blood cultures ordered by EDP -Follow-up procalcitonin level -As needed albuterol  Hypothermia: Body temperature 93 -Bair hugger         DVT ppx:   SQ Lovenox Code Status: Full code per her sister Family Communication:   Yes, patient's sister    at bed side Disposition Plan:  Anticipate discharge back to previous environment Consults called: None Admission status and Level of care: Stepdown:  as inpt     Status is: Inpatient  Remains inpatient appropriate because:Inpatient level of care appropriate due to severity of illness   Dispo: The patient is from: Home              Anticipated d/c is to: Home              Patient currently is not medically stable to d/c.   Difficult to place patient No         Date of Service 03/16/2020    [facility]   If  7PM-7AM, please contact night-coverage [email or web address] 03/16/2020, 4:36 PM

## 2020-03-16 NOTE — ED Triage Notes (Addendum)
Pt arrives via EMS from home after having increased CBG- pt has not been feeling well the past week- pt was given 30 units novolog by family which is double his normal dose- fire department checked his CBG and was still read "high"- pt oriented x2 and alert to voice- pt given 534m LR by EMS

## 2020-03-16 NOTE — ED Notes (Signed)
Provider notified of critical glucose value of 1100.

## 2020-03-16 NOTE — ED Provider Notes (Signed)
[facility] Emergency Department Provider Note ____________________________________________   Event Date/Time   First MD Initiated Contact with Patient 03/16/20 1001     (approximate)  I have reviewed the triage vital signs and the nursing notes.  HISTORY  Chief Complaint Hyperglycemia   HPI Shawn [name] is a 65 y.o. malewho presents to the ED for evaluation of hyperglycemia and confusion.  Chart review indicates history of reduced ejection fraction and CHF.  CKD, HTN, HLD and DM on multiple oral agents and insulin.  EMS reports being called to the house of the patient due to "high" blood glucose readings and increased confusion over the past 1-2 days. EMS did have a "high" blood glucose reading for them, so they started a 500 cc fluid bolus.  They do report that patient's sister provided him with 30 units of subcutaneous short acting insulin prior to transport.  Patient presents to the ED confused and hypoglycemic, unable to provide any relevant history due to his confusion.  Additional history is later obtained from the sister who eventually arrives to the ED. she does describe a few days of cough and sputum production, as well as multiple days of "high" blood glucose readings at home.  Past Medical History:  Diagnosis Date  . Chronic combined systolic (congestive) and diastolic (congestive) heart failure ([place])    a. TTE 6/15: EF < 20%, mildly dilated LV, DD, mildly dilated LA, mod dilated RA, mild MR, mild to mod TR, mod increased posterior wall thickness, elevated LA and LVEDP; b. 4/12019 Echo: EF 20-25%, diff HK. Gr1 DD, nl RV fxn.  . CKD (chronic kidney disease), stage II   . Diabetes mellitus with complication ([place])    a. Prior admissions w/ DKA (last 12/2017).  . Essential hypertension   . NICM (nonischemic cardiomyopathy) ([place])    a. 06/2013 Echo: EF<20%; b. 06/2013 Cath: no signif dzs; c. 04/2017 Echo: 20-25%, gr1 DD.; d.05/2018 Echo: 25-30%   . Polysubstance abuse ([place])    a. etoh and tobacco  . Stroke [facility])     Patient Active Problem List   Diagnosis Date Noted  . Hyperosmolar hyperglycemic state (HHS) ([place]) 03/16/2020  . Acute metabolic encephalopathy 0000000  . Elevated troponin 03/16/2020  . HLD (hyperlipidemia) 03/16/2020  . Abnormal CXR 03/16/2020  . Hypothermia 03/16/2020  . Hypotension 04/30/2019  . Stroke ([place])   . GERD (gastroesophageal reflux disease)   . Elevated lactic acid level   . Dehydration   . Protein-calorie malnutrition, severe ([place]) 04/25/2019  . Hyperosmolar non-ketotic state due to type 2 diabetes mellitus ([place]) 04/23/2019  . Hyperglycemia due to type 2 diabetes mellitus ([place]) 04/23/2019  . Hyperkalemia 04/23/2019  . Acute renal failure superimposed on stage 3b chronic kidney disease ([place]) 04/23/2019  . Diarrhea 04/23/2019  . Congestive heart failure with cardiomyopathy ([place]) 01/18/2019  . AKI (acute kidney injury) ([place])   . Fluid retention   . Nonketotic hyperglycinemia ([place]) 09/22/2018  . Nonketotic hypoglycemia 09/22/2018  . DKA, type 2 ([place]) 08/07/2018  . Essential hypertension 05/12/2018  . Chronic systolic CHF (congestive heart failure) ([place]) 12/15/2017  . NICM (nonischemic cardiomyopathy) ([place]) 05/13/2017  . Type 2 diabetes mellitus with stage 3b chronic kidney disease, with long-term current use of insulin ([place]) 05/13/2017  . Smoker 05/13/2017  . Polysubstance abuse ([place]) 05/13/2017  . Alcohol abuse 05/13/2017  . Diabetic ketoacidosis without coma associated with type 2 diabetes mellitus ([place]) 04/27/2017    Past Surgical History:  Procedure Laterality Date  .  NO PAST SURGERIES      Prior to Admission medications   Medication Sig Start Date End Date Taking? Authorizing Provider  aspirin 81 MG chewable tablet Chew 1 tablet (81 mg total) by mouth daily. 04/30/17  Yes Demetrios Loll, MD  atorvastatin (LIPITOR) 20 MG tablet Take 20 mg by mouth daily.   Yes [provider]   B-Complex-C TABS Take 1 tablet by mouth daily.   Yes [provider]  Cholecalciferol (VITAMIN D3) 1.25 MG (50000 UT) TABS Take 1 tablet by mouth daily.   Yes [provider]  dapagliflozin propanediol (FARXIGA) 10 MG TABS tablet Take 10 mg by mouth daily before breakfast. 05/15/19  Yes Alisa Graff, FNP  ENTRESTO 24-26 MG TAKE 1 TABLET BY MOUTH TWICE A DAY 09/14/19  Yes Darylene Price A, FNP  insulin aspart (NOVOLOG) 100 UNIT/ML injection Inject 5 Units into the skin 3 (three) times daily with meals.   Yes [provider]  insulin glargine (LANTUS) 100 UNIT/ML injection Inject 15-20 Units into the skin at bedtime.    Yes [provider]  omeprazole (PRILOSEC) 20 MG capsule Take 20 mg by mouth daily. 12/18/18  Yes [provider]  spironolactone (ALDACTONE) 25 MG tablet Take 25 mg by mouth daily. 03/04/20  Yes [provider]  torsemide (DEMADEX) 20 MG tablet Take 20 mg by mouth daily as needed.   Yes [provider]  docusate sodium (COLACE) 100 MG capsule Take 100 mg by mouth 2 (two) times daily. Patient not taking: No sig reported    [provider]  Exenatide ER 2 MG PEN Inject 2 mg into the skin once a week.    [provider]  polyethylene glycol (MIRALAX / GLYCOLAX) 17 g packet Take 17 g by mouth daily as needed. Patient not taking: No sig reported 05/01/19   Mercy Riding, MD    Allergies Patient has no known allergies.  Family History  Problem Relation Age of Onset  . Congestive Heart Failure Mother   . Diabetes Mother   . Congestive Heart Failure Brother     Social History Social History   Tobacco Use  . Smoking status: Current Every Day Smoker    Packs/day: 0.50    Types: Cigarettes  . Smokeless tobacco: Never Used  Vaping Use  . Vaping Use: Never used  Substance Use Topics  . Alcohol use: Not Currently    Review of Systems  Unable to be accurately assessed due to patient's altered  mentation.  ____________________________________________   PHYSICAL EXAM:  VITAL SIGNS: Vitals:   03/16/20 1500 03/16/20 1518  BP: (!) 146/76 (!) 146/76  Pulse: 69 71  Resp: 16   Temp:    SpO2: 95%      Constitutional: Alert and disoriented.  Keeps eyes closed, but opens them to gentle vocal stimulation, follows commands in all 4 extremities.  Answer some questions appropriately, but often answers inappropriately and seems confused. Eyes: Conjunctivae are normal. PERRL. EOMI. Head: Atraumatic. Nose: No congestion/rhinnorhea. Mouth/Throat: Mucous membranes are dry.  Oropharynx non-erythematous. Neck: No stridor. No cervical spine tenderness to palpation. Cardiovascular: Normal rate, regular rhythm. Grossly normal heart sounds.  Good peripheral circulation. Respiratory: Minimal tachypnea to the low 20s, no further evidence of distress.  Sats are in the low 90s while supine, improving with semirecumbent positioning.  Faint bibasilar crackles. Gastrointestinal: Soft , nondistended. No CVA tenderness. Tender throughout without peritoneal features. Musculoskeletal: No lower extremity tenderness nor edema.  No joint effusions.  No signs of acute trauma. Neurologic: No gross focal neurologic deficits are appreciated.  Cranial nerves intact. Skin:  Skin is warm, dry and intact. No rash noted. Psychiatric: Mood and affect are difficult to assess  ____________________________________________   LABS (all labs ordered are listed, but only abnormal results are displayed)  Labs Reviewed  COMPREHENSIVE METABOLIC PANEL - Abnormal; Notable for the following components:      Result Value   Sodium 122 (*)    Chloride 81 (*)    Glucose, Bld 1,862 (*)    BUN 80 (*)    Creatinine, Ser 2.67 (*)    Calcium 8.2 (*)    Albumin 3.1 (*)    GFR, Estimated 26 (*)    Anion gap 19 (*)    All other components within normal limits  CBC WITH DIFFERENTIAL/PLATELET - Abnormal; Notable for the following  components:   RBC 3.93 (*)    Hemoglobin 11.4 (*)    MCV 104.1 (*)    MCHC 27.9 (*)    Lymphs Abs 0.6 (*)    All other components within normal limits  URINALYSIS, COMPLETE (UACMP) WITH MICROSCOPIC - Abnormal; Notable for the following components:   Color, Urine YELLOW (*)    APPearance HAZY (*)    Glucose, UA >=500 (*)    Hgb urine dipstick SMALL (*)    Protein, ur 100 (*)    Bacteria, UA RARE (*)    All other components within normal limits  BRAIN NATRIURETIC PEPTIDE - Abnormal; Notable for the following components:   B Natriuretic Peptide 1,104.6 (*)    All other components within normal limits  BLOOD GAS, VENOUS - Abnormal; Notable for the following components:   pO2, Ven 54.0 (*)    Acid-base deficit 6.6 (*)    All other components within normal limits  BETA-HYDROXYBUTYRIC ACID - Abnormal; Notable for the following components:   Beta-Hydroxybutyric Acid 4.27 (*)    All other components within normal limits  OSMOLALITY - Abnormal; Notable for the following components:   Osmolality 398 (*)    All other components within normal limits  CBG MONITORING, ED - Abnormal; Notable for the following components:   Glucose-Capillary >600 (*)    All other components within normal limits  CBG MONITORING, ED - Abnormal; Notable for the following components:   Glucose-Capillary >600 (*)    All other components within normal limits  CBG MONITORING, ED - Abnormal; Notable for the following components:   Glucose-Capillary >600 (*)    All other components within normal limits  CBG MONITORING, ED - Abnormal; Notable for the following components:   Glucose-Capillary >600 (*)    All other components within normal limits  TROPONIN I (HIGH SENSITIVITY) - Abnormal; Notable for the following components:   Troponin I (High Sensitivity) 48 (*)    All other components within normal limits  TROPONIN I (HIGH SENSITIVITY) - Abnormal; Notable for the following components:   Troponin I (High  Sensitivity) 50 (*)    All other components within normal limits  RESP PANEL BY RT-PCR (FLU A&B, COVID) ARPGX2  CULTURE, BLOOD (SINGLE)  C DIFFICILE QUICK SCREEN W PCR REFLEX  GASTROINTESTINAL PANEL BY PCR, STOOL (REPLACES STOOL CULTURE)  MAGNESIUM  URINE DRUG SCREEN, QUALITATIVE ([facility] ONLY)  BASIC METABOLIC PANEL  BASIC METABOLIC PANEL  BASIC METABOLIC PANEL  BASIC METABOLIC PANEL  PROCALCITONIN  TROPONIN I (HIGH SENSITIVITY)   ____________________________________________  12 Lead EKG  , Rate of 73 bpm.  Normal axis and intervals.  Stigmata of LVH.  Inferior and lateral T wave inversions without STEMI criteria. ____________________________________________  RADIOLOGY  ED MD interpretation: CT head reviewed by me without evidence of acute intracranial pathology. CXR reviewed by me with diffuse left-sided airspace opacities  Official radiology report(s): CT Head Wo Contrast  Result Date: 03/16/2020 CLINICAL DATA:  Altered mental status/confusion EXAM: CT HEAD WITHOUT CONTRAST TECHNIQUE: Contiguous axial images were obtained from the base of the skull through the vertex without intravenous contrast. COMPARISON:  March 21, 2018 FINDINGS: Brain: There is mild diffuse atrophy, stable. There is no appreciable intracranial mass, hemorrhage, extra-axial fluid collection, or midline shift. Decreased attenuation is noted in each inferior frontal lobe, a stable finding. There is mild decreased attenuation in the centra semiovale bilaterally. No acute appearing infarct evident. Vascular: No hyperdense vessel. There is calcification in each carotid siphon region. Skull: The bony calvarium appears intact. Sinuses/Orbits: Visualized paranasal sinuses are clear. Visualized orbits appear symmetric bilaterally. Other: Visualized mastoid air cells are clear. IMPRESSION: Mild atrophy with mild periventricular small vessel disease. Decreased attenuation in each inferior frontal lobe is consistent with  encephalomalacia, a stable finding. Question previous trauma as cause of this appearance. No mass, hemorrhage, or extra-axial fluid collection. No demonstrable acute infarct. There are foci of arterial vascular calcification. Electronically Signed   By: Lowella Grip III M.D.   On: 03/16/2020 11:18   DG Chest Portable 1 View  Result Date: 03/16/2020 CLINICAL DATA:  Hypoxia EXAM: PORTABLE CHEST 1 VIEW COMPARISON:  January 18, 2019 FINDINGS: There is airspace opacity throughout much of the left lung with small left pleural effusion. The right lung is clear. Heart is upper normal in size with pulmonary vascularity normal. No adenopathy. There is degenerative type change in each shoulder. IMPRESSION: Widespread airspace opacity throughout much of the left lung, most likely due to multifocal pneumonia. Small left pleural effusion. Right lung clear. Stable cardiac silhouette. Electronically Signed   By: Lowella Grip III M.D.   On: 03/16/2020 10:29    ____________________________________________   PROCEDURES and INTERVENTIONS  Procedure(s) performed (including Critical Care):  .1-3 Lead EKG Interpretation Performed by: Vladimir Crofts, MD Authorized by: Vladimir Crofts, MD     Interpretation: normal     ECG rate:  70   ECG rate assessment: normal     Rhythm: sinus rhythm     Ectopy: none     Conduction: normal      Medications  insulin regular, human (MYXREDLIN) 100 units/ 100 mL infusion (7.5 Units/hr Intravenous New Bag/Given 03/16/20 1220)  lactated ringers infusion ( Intravenous Rate/Dose Change 03/16/20 1453)  dextrose 5 % in lactated ringers infusion (has no administration in time range)  dextrose 50 % solution 0-50 mL (has no administration in time range)  potassium chloride 10 mEq in 100 mL IVPB (10 mEq Intravenous New Bag/Given 03/16/20 1519)  ondansetron (ZOFRAN) injection 4 mg (has no administration in time range)  acetaminophen (TYLENOL) tablet 650 mg (has no administration in  time range)  acetaminophen (TYLENOL) suppository 650 mg (has no administration in time range)  hydrALAZINE (APRESOLINE) injection 5 mg (has no administration in time range)  nicotine (NICODERM CQ - dosed in mg/24 hours) patch 21 mg (has no administration in time range)  LORazepam (ATIVAN) tablet 1-4 mg (has no administration in time range)    Or  LORazepam (ATIVAN) injection 1-4 mg (has no administration in time range)  thiamine tablet 100 mg (has no administration in time range)    Or  thiamine (B-1)  injection 100 mg (has no administration in time range)  folic acid (FOLVITE) tablet 1 mg (0 mg Oral Hold 03/16/20 1526)  multivitamin with minerals tablet 1 tablet (0 tablets Oral Hold 03/16/20 1527)  LORazepam (ATIVAN) injection 0-4 mg (0 mg Intravenous Not Given 03/16/20 1528)    Followed by  LORazepam (ATIVAN) injection 0-4 mg (has no administration in time range)  enoxaparin (LOVENOX) injection 30 mg (has no administration in time range)  albuterol (VENTOLIN HFA) 108 (90 Base) MCG/ACT inhaler 2 puff (has no administration in time range)  dextromethorphan-guaiFENesin (MUCINEX DM) 30-600 MG per 12 hr tablet 1 tablet (has no administration in time range)  aspirin chewable tablet 81 mg (0 mg Oral Hold 03/16/20 1527)  atorvastatin (LIPITOR) tablet 20 mg (0 mg Oral Hold 03/16/20 1527)  pantoprazole (PROTONIX) EC tablet 40 mg (0 mg Oral Hold 03/16/20 1527)  B-complex with vitamin C tablet 1 tablet (0 tablets Oral Hold 03/16/20 1528)  lactated ringers bolus 1,000 mL (0 mLs Intravenous Stopped 03/16/20 1201)  cefTRIAXone (ROCEPHIN) 1 g in sodium chloride 0.9 % 100 mL IVPB (0 g Intravenous Stopped 03/16/20 1301)    ____________________________________________   MDM / ED COURSE   65 year old male with reduced ejection fraction and diabetes present to the ED confused with evidence of HHS, possibly precipitated by CAP, and requiring medical admission.  Hemodynamically stable and not hypoxic on room air.   Exam demonstrates nonfocal confusion and some inappropriate responses, but no distress.  He has no focal neurovascular deficits or any signs of trauma.  Does have abdominal tenderness throughout without focal features or peritoneal features.  Work-up with evidence of HHS, more so than DKA.  VBG demonstrates no acidosis and his anion gap is not been impressive considering his remarkable hyperglycemia.  Initiated gentle fluid resuscitation with a single liter of LR, being hesitant due to his ejection fraction of 20%.  Started insulin drip to treat HHS.  His CXR is concerning for pneumonia on the left side, as a possible precipitator of his HHS, and so blood culture was drawn and Rocephin was provided to treat this.  Patient will be admitted to hospitalist for further work-up and management.  Clinical Course as of 03/16/20 1539  Sun Mar 16, 2020  1048 CXR reviewed with opacities throughout the left lung fields.  Reassessed the patient, and sister is now at the bedside.  She reports that patient has had "high" blood glucose levels for the past couple days that she has been trying to manage at home, and has only gotten them as low as the 400s yesterday.  Sister reports concerned that patient has been more confused and not doing his typical routines.  [DS]    Clinical Course User Index [DS] Vladimir Crofts, MD    ____________________________________________   FINAL CLINICAL IMPRESSION(S) / ED DIAGNOSES  Final diagnoses:  Hyperosmolar hyperglycemic state (HHS) ([place])  Dehydration  Confusion     ED Discharge Orders    None       Sinia Antosh   Note:  This document was prepared using Dragon voice recognition software and may include unintentional dictation errors.   Vladimir Crofts, MD 03/16/20 [phone]

## 2020-03-16 NOTE — ED Notes (Signed)
Repeat green top sent to lab

## 2020-03-17 ENCOUNTER — Other Ambulatory Visit: Payer: Self-pay

## 2020-03-17 DIAGNOSIS — G9341 Metabolic encephalopathy: Secondary | ICD-10-CM

## 2020-03-17 DIAGNOSIS — E1165 Type 2 diabetes mellitus with hyperglycemia: Secondary | ICD-10-CM

## 2020-03-17 DIAGNOSIS — I5022 Chronic systolic (congestive) heart failure: Secondary | ICD-10-CM | POA: Diagnosis not present

## 2020-03-17 DIAGNOSIS — E1100 Type 2 diabetes mellitus with hyperosmolarity without nonketotic hyperglycemic-hyperosmolar coma (NKHHC): Principal | ICD-10-CM

## 2020-03-17 DIAGNOSIS — E876 Hypokalemia: Secondary | ICD-10-CM

## 2020-03-17 DIAGNOSIS — R9389 Abnormal findings on diagnostic imaging of other specified body structures: Secondary | ICD-10-CM | POA: Diagnosis not present

## 2020-03-17 LAB — BASIC METABOLIC PANEL
Anion gap: 14 (ref 5–15)
Anion gap: 13 (ref 5–15)
BUN: 74 mg/dL — ABNORMAL HIGH (ref 8–23)
BUN: 70 mg/dL — ABNORMAL HIGH (ref 8–23)
CO2: 28 mmol/L (ref 22–32)
CO2: 31 mmol/L (ref 22–32)
Calcium: 9 mg/dL (ref 8.9–10.3)
Calcium: 9.1 mg/dL (ref 8.9–10.3)
Chloride: 94 mmol/L — ABNORMAL LOW (ref 98–111)
Chloride: 96 mmol/L — ABNORMAL LOW (ref 98–111)
Creatinine, Ser: 2.22 mg/dL — ABNORMAL HIGH (ref 0.61–1.24)
Creatinine, Ser: 1.89 mg/dL — ABNORMAL HIGH (ref 0.61–1.24)
GFR, Estimated: 32 mL/min — ABNORMAL LOW (ref >60)
GFR, Estimated: 39 mL/min — ABNORMAL LOW (ref >60)
Glucose, Bld: 683 mg/dL (ref 70–99)
Glucose, Bld: 273 mg/dL — ABNORMAL HIGH (ref 70–99)
Potassium: 3.2 mmol/L — ABNORMAL LOW (ref 3.5–5.1)
Potassium: 3 mmol/L — ABNORMAL LOW (ref 3.5–5.1)
Sodium: 136 mmol/L (ref 135–145)
Sodium: 140 mmol/L (ref 135–145)

## 2020-03-17 LAB — CBG MONITORING, ED
Glucose-Capillary: 107 mg/dL — ABNORMAL HIGH (ref 70–99)
Glucose-Capillary: 129 mg/dL — ABNORMAL HIGH (ref 70–99)
Glucose-Capillary: 134 mg/dL — ABNORMAL HIGH (ref 70–99)
Glucose-Capillary: 146 mg/dL — ABNORMAL HIGH (ref 70–99)
Glucose-Capillary: 185 mg/dL — ABNORMAL HIGH (ref 70–99)
Glucose-Capillary: 262 mg/dL — ABNORMAL HIGH (ref 70–99)
Glucose-Capillary: 332 mg/dL — ABNORMAL HIGH (ref 70–99)
Glucose-Capillary: 342 mg/dL — ABNORMAL HIGH (ref 70–99)
Glucose-Capillary: 430 mg/dL — ABNORMAL HIGH (ref 70–99)
Glucose-Capillary: 453 mg/dL — ABNORMAL HIGH (ref 70–99)
Glucose-Capillary: 508 mg/dL (ref 70–99)

## 2020-03-17 LAB — GLUCOSE, CAPILLARY
Glucose-Capillary: 90 mg/dL (ref 70–99)
Glucose-Capillary: 287 mg/dL — ABNORMAL HIGH (ref 70–99)
Glucose-Capillary: 102 mg/dL — ABNORMAL HIGH (ref 70–99)

## 2020-03-17 LAB — LIPID PANEL
Cholesterol: 197 mg/dL (ref 0–200)
HDL: 47 mg/dL (ref >40)
LDL Cholesterol: 136 mg/dL — ABNORMAL HIGH (ref 0–99)
Total CHOL/HDL Ratio: 4.2 RATIO
Triglycerides: 72 mg/dL (ref <150)
VLDL: 14 mg/dL (ref 0–40)

## 2020-03-17 LAB — HEMOGLOBIN A1C
Hgb A1c MFr Bld: 12.6 % — ABNORMAL HIGH (ref 4.8–5.6)
Mean Plasma Glucose: 315 mg/dL

## 2020-03-17 LAB — MAGNESIUM: Magnesium: 2.2 mg/dL (ref 1.7–2.4)

## 2020-03-17 LAB — PHOSPHORUS: Phosphorus: 4.8 mg/dL — ABNORMAL HIGH (ref 2.5–4.6)

## 2020-03-17 MED ORDER — INSULIN ASPART 100 UNIT/ML ~~LOC~~ SOLN
0.0000 [IU] | SUBCUTANEOUS | Status: DC
  Administered 2020-03-17: 5 [IU] via SUBCUTANEOUS
  Filled 2020-03-17: qty 1

## 2020-03-17 MED ORDER — INSULIN ASPART 100 UNIT/ML ~~LOC~~ SOLN: 0.0000 [IU] | Freq: Every day | SUBCUTANEOUS | Status: DC

## 2020-03-17 MED ORDER — INSULIN ASPART 100 UNIT/ML ~~LOC~~ SOLN
0.0000 [IU] | Freq: Three times a day (TID) | SUBCUTANEOUS | Status: DC
  Administered 2020-03-18: 5 [IU] via SUBCUTANEOUS
  Administered 2020-03-18: 9 [IU] via SUBCUTANEOUS
  Filled 2020-03-17 (×2): qty 1

## 2020-03-17 MED ORDER — INSULIN GLARGINE 100 UNIT/ML ~~LOC~~ SOLN
15.0000 [IU] | Freq: Every day | SUBCUTANEOUS | Status: DC
  Administered 2020-03-17 – 2020-03-18 (×2): 15 [IU] via SUBCUTANEOUS
  Filled 2020-03-17 (×2): qty 0.15

## 2020-03-17 MED ORDER — ENOXAPARIN SODIUM 40 MG/0.4ML ~~LOC~~ SOLN: 40.0000 mg | SUBCUTANEOUS | Status: DC

## 2020-03-17 MED ORDER — POTASSIUM CHLORIDE 10 MEQ/100ML IV SOLN
10.0000 meq | INTRAVENOUS | Status: AC
  Administered 2020-03-17 (×6): 10 meq via INTRAVENOUS
  Filled 2020-03-17 (×5): qty 100

## 2020-03-17 MED ORDER — INSULIN ASPART 100 UNIT/ML ~~LOC~~ SOLN: 0.0000 [IU] | Freq: Three times a day (TID) | SUBCUTANEOUS | Status: DC

## 2020-03-17 MED ORDER — INSULIN GLARGINE 100 UNIT/ML ~~LOC~~ SOLN: 15.0000 [IU] | Freq: Every day | SUBCUTANEOUS | 11 refills | Status: DC

## 2020-03-17 MED ORDER — INSULIN ASPART 100 UNIT/ML ~~LOC~~ SOLN: 0.0000 [IU] | SUBCUTANEOUS | Status: DC

## 2020-03-17 MED ORDER — POTASSIUM CHLORIDE CRYS ER 20 MEQ PO TBCR
40.0000 meq | EXTENDED_RELEASE_TABLET | Freq: Once | ORAL | Status: AC
  Administered 2020-03-17: 40 meq via ORAL
  Filled 2020-03-17: qty 2

## 2020-03-17 NOTE — ED Notes (Signed)
Informed RN bed assigned 1206

## 2020-03-17 NOTE — Progress Notes (Addendum)
Met w/ pt at bedside this afternoon.  Discussed with patient diagnosis of HHS (pathophysiology), treatment of HHS, lab results, and transition plan to SQ insulin regimen.  Explained to pt that he received 15 units Lantus earlier this AM and then the IV Insulin Drip was stopped.  CBG at 1pm was 90.    Pt told me he drank 3 glasses on tea the day before he was admitted and that he thought it was "diet tea" but that now he thinks it may have been regular, sugary tea.  I discussed with pt that 3 glasses of sweet tea likely was not enough sugar to drive his glucose to 1,862 mg/dl.  Pt admits to eating poorly at times.  Did state that he does not have a problem taking his insulin and that his sister (whom he lives with) also gives him insulin as well.  Pt could not recall what his last A1c was but did confirm his PCP is with [facility] and stated they send him his meds.  Discussed with pt that we have a current A1c in process and will be able to see if elevated CBGs have been an ongoing issue for him.  Pt states he has all his meds and CBG meter supplies at home.  Discussed with pt the importance of taking meds and trying to stick to diabetes-friendly diet.  Reminded pt that extremely elevated glucose levels can lead to severe long-term complications and death if not treated quickly.  Called pt's sister listed in Demographics with pt's permission.  Asked sister to bring pt's cell phone and glasses per pt request.  Reviewed the same above info with sister.  Pt's sister confirmed pt has meds and that she helps him with his insulin sometimes.  Sister told me pt often "sneaks" foods he shouldn't eat.  Sister confirmed pt is usually good about taking his insulin and that she does assist him with the insulin.  But sister did also tell me that it's possible pt has been non-adherent with his insulin of late.  Discussed with sister that pt told me about the 3 glasses of sweet tea he drank and how I explained this one  incident likely did not lead to a glucose of 1,862 mg/dl.  Sister stated understanding.  Also discussed with sister that we are waiting on a current A1c level.  Offered sister time to ask questions but she stated she did not have any current questions.  Just wanted to know when pt could possibly be discharged.  Discussed with sister that if CBGs remain stable pt may be able to go home tomorrow since Diabetes was his issue for admission, however, the decision for when to d/c home is ultimately up to the physician.    --Will follow patient during hospitalization--  Wyn Quaker RN, MSN, CDE Diabetes Coordinator Inpatient Glycemic Control Team Team Pager: [phone] (8a-5p)

## 2020-03-17 NOTE — ED Notes (Signed)
Pt's external catheter noted to be kinked and soiled linens. Pt's linens changed and external catheter taped in place.

## 2020-03-17 NOTE — Progress Notes (Signed)
Inpatient Diabetes Program Recommendations  AACE/ADA: New Consensus Statement on Inpatient Glycemic Control (2015)  Target Ranges:  Prepandial:   less than 140 mg/dL      Peak postprandial:   less than 180 mg/dL (1-2 hours)      Critically ill patients:  140 - 180 mg/dL   Results for Shawn [name], Shawn [name] (MRN [id number]) as of 03/17/2020 06:52  Ref. Range 03/16/2020 10:08  Sodium Latest Ref Range: 135 - 145 mmol/L 122 (L)  Potassium Latest Ref Range: 3.5 - 5.1 mmol/L 4.1  Chloride Latest Ref Range: 98 - 111 mmol/L 81 (L)  CO2 Latest Ref Range: 22 - 32 mmol/L 22  Glucose Latest Ref Range: 70 - 99 mg/dL 1,862 (HH)  BUN Latest Ref Range: 8 - 23 mg/dL 80 (H)  Creatinine Latest Ref Range: 0.61 - 1.24 mg/dL 2.67 (H)  Calcium Latest Ref Range: 8.9 - 10.3 mg/dL 8.2 (L)  Anion gap Latest Ref Range: 5 - 15  19 (H)   Results for Shawn [name], Shawn [name] (MRN [id number]) as of 03/17/2020 06:52  Ref. Range 03/16/2020 10:08  Beta-Hydroxybutyric Acid Latest Ref Range: 0.05 - 0.27 mmol/L 4.27 (H)   Results for Shawn [name], Shawn [name] (MRN [id number]) as of 03/17/2020 06:52  Ref. Range 03/16/2020 09:56 03/16/2020 12:50 03/16/2020 13:38 03/16/2020 14:56 03/16/2020 15:59 03/16/2020 17:19 03/16/2020 18:48 03/16/2020 19:37 03/16/2020 20:14 03/16/2020 20:45 03/16/2020 21:18 03/16/2020 21:54 03/16/2020 22:28 03/16/2020 23:13 03/16/2020 23:55 03/17/2020 00:29 03/17/2020 01:06 03/17/2020 01:40 03/17/2020 02:12 03/17/2020 03:07 03/17/2020 04:25 03/17/2020 05:15 03/17/2020 06:23  Glucose-Capillary Latest Ref Range: 70 - 99 mg/dL >600 (HH)   >600 (HH)  IV Insulin Drip Started '@12'$ :18pm >600 (HH) >600 (HH) >600 (HH) >600 (HH) >600 (HH) >600 (HH) >600 (HH) >600 (HH) >600 (HH) >600 (HH) >600 (HH) >600 (HH) 575 (HH) 508 (HH) 453 (H) 430 (H) 342 (H) 332 (H) 262 (H) 185 (H) 134 (H)    Admit with: Hyperosmolar hyperglycemic state  History: DM, DKA, HHS, CVA,Polysubstance Abuse, CHF, CKD  Home DM Meds: Lantus 15-20 units QHS       Novolog 5 units  TID       Faxiga 10 mg Daily  Current Orders: IV Insulin Drip  [facility]    MD- Note 4:20am BMET shows the following:  Glucose 273 mg/dl/ Anion Gap= 13/ CO2 level= 31  CBGs finally below 200 by 5am today  Once patient has at least 4 consecutive CBGs <200, may consider transition to SQ Insulin given CO2 level and Anion Gap are WNL this AM  When pt is ready to transition to SQ Insulin, please consider: 1. Start Lantus 15 units Daily (make sure to give 1st dose Lantus at least 1 hour prior to d/c of the IV Insulin Drip) 2. Start Novolog Sensitive Correction Scale/ SSI (0-9 units) Q4 hours    --Will follow patient during hospitalization--  Wyn Quaker RN, MSN, CDE Diabetes Coordinator Inpatient Glycemic Control Team Team Pager: [phone] (8a-5p)

## 2020-03-17 NOTE — ED Notes (Signed)
Provider notified of critical glucose of 683.

## 2020-03-17 NOTE — Progress Notes (Signed)
1        [facility] NAME: Shawn [name]    MR#:  [id number]  DATE OF BIRTH:  [birth year]  SUBJECTIVE:  CHIEF COMPLAINT:   Chief Complaint  Patient presents with  . Hyperglycemia  Admits to missing some doses of insulin and dietary discretion at times.  Feeling better, denies having this kind of uncontrolled blood sugar requiring hospitalization in the past REVIEW OF SYSTEMS:  Review of Systems  Constitutional: Negative for diaphoresis, fever, malaise/fatigue and weight loss.  HENT: Negative for ear discharge, ear pain, hearing loss, nosebleeds, sore throat and tinnitus.   Eyes: Negative for blurred vision and pain.  Respiratory: Negative for cough, hemoptysis, shortness of breath and wheezing.   Cardiovascular: Negative for chest pain, palpitations, orthopnea and leg swelling.  Gastrointestinal: Negative for abdominal pain, blood in stool, constipation, diarrhea, heartburn, nausea and vomiting.  Genitourinary: Negative for dysuria, frequency and urgency.  Musculoskeletal: Negative for back pain and myalgias.  Skin: Negative for itching and rash.  Neurological: Negative for dizziness, tingling, tremors, focal weakness, seizures, weakness and headaches.  Psychiatric/Behavioral: Negative for depression. The patient is not nervous/anxious.    DRUG ALLERGIES:  No Known Allergies VITALS:  Blood pressure 119/88, pulse 70, temperature 98.2 F (36.8 C), temperature source Oral, resp. rate 20, height 6' (1.829 m), weight 60.2 kg, SpO2 100 %. PHYSICAL EXAMINATION:  Physical Exam HENT:     Head: Normocephalic and atraumatic.  Eyes:     Conjunctiva/sclera: Conjunctivae normal.     Pupils: Pupils are equal, round, and reactive to light.  Neck:     Thyroid: No thyromegaly.     Trachea: No tracheal deviation.  Cardiovascular:     Rate and Rhythm: Normal rate and regular rhythm.     Heart sounds: Normal heart sounds.  Pulmonary:     Effort: Pulmonary  effort is normal. No respiratory distress.     Breath sounds: Normal breath sounds. No wheezing.  Chest:     Chest wall: No tenderness.  Abdominal:     General: Bowel sounds are normal. There is no distension.     Palpations: Abdomen is soft.     Tenderness: There is no abdominal tenderness.  Musculoskeletal:        General: Normal range of motion.     Cervical back: Normal range of motion and neck supple.  Skin:    General: Skin is warm and dry.     Findings: No rash.  Neurological:     Mental Status: He is alert and oriented to person, place, and time.     Cranial Nerves: No cranial nerve deficit.    LABORATORY PANEL:  Male CBC Recent Labs  Lab 03/16/20 1008  WBC 6.5  HGB 11.4*  HCT 40.9  PLT 356   ------------------------------------------------------------------------------------------------------------------ Chemistries  Recent Labs  Lab 03/16/20 1008 03/16/20 1550 03/17/20 0420  NA 122*   < > 140  K 4.1   < > 3.0*  CL 81*   < > 96*  CO2 22   < > 31  GLUCOSE 1,862*   < > 273*  BUN 80*   < > 70*  CREATININE 2.67*   < > 1.89*  CALCIUM 8.2*   < > 9.1  MG 2.3  --  2.2  AST 20  --   --   ALT 16  --   --   ALKPHOS 108  --   --   BILITOT 1.1  --   --    < > =  values in this interval not displayed.   RADIOLOGY:  No results found. ASSESSMENT AND PLAN:  65 y.o. male with medical history significant of diabetes mellitus, DKA, HHS, hypertension, hyperlipidemia, stroke, GERD, polysubstance abuse, alcohol abuse, tobacco abuse, CHF with EF of 25-30%, CKD stage IIIb, who presents with elevated blood sugar, weakness and confusion  Hyperosmolar hyperglycemic state: POA Now resolved.  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 Last A1c of 11.0 suggestive of poorly controlled  diabetes  Essential hypertension Blood pressure well controlled we will hold off restarting home medication for now  Chronic systolic CHF 2D echo on [id number] showed EF of 25 to 30% Well compensated at this time  Alcohol and tobacco abuse Counseled for cessation CIWA protocol Nicotine patch  Acute on chronic kidney disease stage IIIb Baseline creatinine of 1.16 Creatinine on admission was 2.67 likely from dehydration, prerenal state Holding diuretics and Entresto Creatinine improving with IV hydration  Diarrhea -Admitting physician has ordered C. difficile PCR and GI pathogen panel Patient did not report any bowel movement for me while here in the emergency department  Stroke [facility]) -Continue aspirin, Lipitor  GERD (gastroesophageal reflux disease) -Protonix  Acute metabolic encephalopathy: multifactorial etiology, including HHS, worsening renal function.  CT head is negative for acute intracranial abnormalities.  No focal neuro deficit on examination. -Frequent neuro check - UDS negative for any substance of abuse  Elevated troponin:  Due to demand ischemia.  No MI  HLD (hyperlipidemia) -Lipitor  Abnormal CXR: Chest x-ray showed multifocal infiltration particularly on the left side.  Patient does not have leukocytosis.  Clinically does not seem to have pneumonia.  May be due to pulmonary edema given very low EF 25-30%.  Patient received 1 dose of Rocephin in ED.  Will hold off antibiotics now.  Normal procalcitonin level  Hypothermia: Body temperature 93, present on admission -Bair hugger.  Now resolved  Body mass index is 18 kg/m.  Net IO Since Admission: 1,864.91 mL [03/17/20 1525]      Status is: Inpatient  Remains inpatient appropriate because:Persistent severe electrolyte disturbances   Dispo: The patient is from: Home              Anticipated d/c is to: Home              Patient currently is not medically stable to d/c.   Difficult to place  patient No   DVT prophylaxis:       enoxaparin (LOVENOX) injection 40 mg Start: 03/18/20 2200     Family Communication: (Specify name, relationship & date discussed. NO "discussed with patient")   All the records are reviewed and case discussed with Care Management/Social Worker. Management plans discussed with the patient, family and they are in agreement.  CODE STATUS: Full Code Level of care: Med-Surg  TOTAL TIME TAKING CARE OF THIS PATIENT: 35 minutes.   More than 50% of the time was spent in counseling/coordination of care: YES  POSSIBLE D/C IN 1 DAYS, DEPENDING ON CLINICAL CONDITION.   Max Sane M.D on 03/17/2020 at 3:25 PM  [facility]   CC: Primary care physician; Center, [place]  Note: This dictation was prepared with Dragon dictation along with smaller phrase technology. Any transcriptional errors that result from this process are unintentional.

## 2020-03-18 DIAGNOSIS — R410 Disorientation, unspecified: Secondary | ICD-10-CM | POA: Diagnosis not present

## 2020-03-18 DIAGNOSIS — E1100 Type 2 diabetes mellitus with hyperosmolarity without nonketotic hyperglycemic-hyperosmolar coma (NKHHC): Secondary | ICD-10-CM | POA: Diagnosis not present

## 2020-03-18 DIAGNOSIS — R9389 Abnormal findings on diagnostic imaging of other specified body structures: Secondary | ICD-10-CM | POA: Diagnosis not present

## 2020-03-18 DIAGNOSIS — E860 Dehydration: Secondary | ICD-10-CM

## 2020-03-18 LAB — CBC
HCT: 35.6 % — ABNORMAL LOW (ref 39.0–52.0)
Hemoglobin: 11.8 g/dL — ABNORMAL LOW (ref 13.0–17.0)
MCH: 28.9 pg (ref 26.0–34.0)
MCHC: 33.1 g/dL (ref 30.0–36.0)
MCV: 87.3 fL (ref 80.0–100.0)
Platelets: 315 10*3/uL (ref 150–400)
RBC: 4.08 MIL/uL — ABNORMAL LOW (ref 4.22–5.81)
RDW: 14.3 % (ref 11.5–15.5)
WBC: 11.8 10*3/uL — ABNORMAL HIGH (ref 4.0–10.5)
nRBC: 0 % (ref 0.0–0.2)

## 2020-03-18 LAB — BASIC METABOLIC PANEL
Anion gap: 7 (ref 5–15)
BUN: 52 mg/dL — ABNORMAL HIGH (ref 8–23)
CO2: 31 mmol/L (ref 22–32)
Calcium: 8.8 mg/dL — ABNORMAL LOW (ref 8.9–10.3)
Chloride: 98 mmol/L (ref 98–111)
Creatinine, Ser: 1.35 mg/dL — ABNORMAL HIGH (ref 0.61–1.24)
GFR, Estimated: 59 mL/min — ABNORMAL LOW (ref >60)
Glucose, Bld: 295 mg/dL — ABNORMAL HIGH (ref 70–99)
Potassium: 5.2 mmol/L — ABNORMAL HIGH (ref 3.5–5.1)
Sodium: 136 mmol/L (ref 135–145)

## 2020-03-18 LAB — GLUCOSE, CAPILLARY
Glucose-Capillary: 368 mg/dL — ABNORMAL HIGH (ref 70–99)
Glucose-Capillary: 295 mg/dL — ABNORMAL HIGH (ref 70–99)

## 2020-03-18 MED ORDER — INSULIN ASPART 100 UNIT/ML ~~LOC~~ SOLN
3.0000 [IU] | Freq: Three times a day (TID) | SUBCUTANEOUS | Status: DC
  Administered 2020-03-18: 3 [IU] via SUBCUTANEOUS
  Filled 2020-03-18: qty 1

## 2020-03-18 MED ORDER — INSULIN GLARGINE 100 UNIT/ML ~~LOC~~ SOLN: 20.0000 [IU] | Freq: Every day | SUBCUTANEOUS | 11 refills | Status: DC

## 2020-03-18 MED ORDER — INSULIN GLARGINE 100 UNIT/ML ~~LOC~~ SOLN
20.0000 [IU] | Freq: Every day | SUBCUTANEOUS | Status: DC
  Filled 2020-03-18: qty 0.2

## 2020-03-18 NOTE — Progress Notes (Addendum)
Inpatient Diabetes Program Recommendations  AACE/ADA: New Consensus Statement on Inpatient Glycemic Control (2015)  Target Ranges:  Prepandial:   less than 140 mg/dL      Peak postprandial:   less than 180 mg/dL (1-2 hours)      Critically ill patients:  140 - 180 mg/dL   Results for Shawn [name], Shawn [name] (MRN [id number]) as of 03/18/2020 08:21  Ref. Range 03/17/2020 09:39 03/17/2020 12:56 03/17/2020 16:32 03/17/2020 20:54  Glucose-Capillary Latest Ref Range: 70 - 99 mg/dL 107 (H)  15 units LANTUS '@8'$ :46am 90 287 (H)  5 units NOVOLOG  102 (H)   Results for Shawn [name], Shawn [name] (MRN [id number]) as of 03/18/2020 08:21  Ref. Range 03/18/2020 07:52  Glucose-Capillary Latest Ref Range: 70 - 99 mg/dL 368 (H)    Home DM Meds: Lantus 15-20 units QHS                             Novolog 5 units TID                             Faxiga 10 mg Daily  Current Orders: Lantus 15 units Daily      Novolog Sensitive Correction Scale/ SSI (0-9 units) TID AC + HS   [facility]    MD- Unsure why CBG so high this AM??  Did pt eat prior to CBG being checked??  May consider the following:  1. Increase Lantus to 20 units Daily this AM  2. Start Novolog 3 units TID for meal coverage Hold if pt eats <50% of meal, Hold if pt NPO     --Will follow patient during hospitalization--  Wyn Quaker RN, MSN, CDE Diabetes Coordinator Inpatient Glycemic Control Team Team Pager: [phone] (8a-5p)

## 2020-03-18 NOTE — Discharge Instructions (Signed)
Diabetes Mellitus and Nutrition, Adult When you have diabetes, or diabetes mellitus, it is very important to have healthy eating habits because your blood sugar (glucose) levels are greatly affected by what you eat and drink. Eating healthy foods in the right amounts, at about the same times every day, can help you:  Control your blood glucose.  Lower your risk of heart disease.  Improve your blood pressure.  Reach or maintain a healthy weight. What can affect my meal plan? Every person with diabetes is different, and each person has different needs for a meal plan. Your health care provider may recommend that you work with a dietitian to make a meal plan that is best for you. Your meal plan may vary depending on factors such as:  The calories you need.  The medicines you take.  Your weight.  Your blood glucose, blood pressure, and cholesterol levels.  Your activity level.  Other health conditions you have, such as heart or kidney disease. How do carbohydrates affect me? Carbohydrates, also called carbs, affect your blood glucose level more than any other type of food. Eating carbs naturally raises the amount of glucose in your blood. Carb counting is a method for keeping track of how many carbs you eat. Counting carbs is important to keep your blood glucose at a healthy level, especially if you use insulin or take certain oral diabetes medicines. It is important to know how many carbs you can safely have in each meal. This is different for every person. Your dietitian can help you calculate how many carbs you should have at each meal and for each snack. How does alcohol affect me? Alcohol can cause a sudden decrease in blood glucose (hypoglycemia), especially if you use insulin or take certain oral diabetes medicines. Hypoglycemia can be a life-threatening condition. Symptoms of hypoglycemia, such as sleepiness, dizziness, and confusion, are similar to symptoms of having too much  alcohol.  Do not drink alcohol if: ? Your health care provider tells you not to drink. ? You are pregnant, may be pregnant, or are planning to become pregnant.  If you drink alcohol: ? Do not drink on an empty stomach. ? Limit how much you use to:  0-1 drink a day for women.  0-2 drinks a day for men. ? Be aware of how much alcohol is in your drink. In the [place] one drink equals one 12 oz bottle of beer (355 mL), one 5 oz glass of wine (148 mL), or one 1 oz glass of hard liquor (44 mL). ? Keep yourself hydrated with water, diet soda, or unsweetened iced tea.  Keep in mind that regular soda, juice, and other mixers may contain a lot of sugar and must be counted as carbs. What are tips for following this plan? Reading food labels  Start by checking the serving size on the "Nutrition Facts" label of packaged foods and drinks. The amount of calories, carbs, fats, and other nutrients listed on the label is based on one serving of the item. Many items contain more than one serving per package.  Check the total grams (g) of carbs in one serving. You can calculate the number of servings of carbs in one serving by dividing the total carbs by 15. For example, if a food has 30 g of total carbs per serving, it would be equal to 2 servings of carbs.  Check the number of grams (g) of saturated fats and trans fats in one serving. Choose foods that have   a low amount or none of these fats.  Check the number of milligrams (mg) of salt (sodium) in one serving. Most people should limit total sodium intake to less than 2,300 mg per day.  Always check the nutrition information of foods labeled as "low-fat" or "nonfat." These foods may be higher in added sugar or refined carbs and should be avoided.  Talk to your dietitian to identify your daily goals for nutrients listed on the label. Shopping  Avoid buying canned, pre-made, or processed foods. These foods tend to be high in fat, sodium, and added  sugar.  Shop around the outside edge of the grocery store. This is where you will most often find fresh fruits and vegetables, bulk grains, fresh meats, and fresh dairy. Cooking  Use low-heat cooking methods, such as baking, instead of high-heat cooking methods like deep frying.  Cook using healthy oils, such as olive, canola, or sunflower oil.  Avoid cooking with butter, cream, or high-fat meats. Meal planning  Eat meals and snacks regularly, preferably at the same times every day. Avoid going long periods of time without eating.  Eat foods that are high in fiber, such as fresh fruits, vegetables, beans, and whole grains. Talk with your dietitian about how many servings of carbs you can eat at each meal.  Eat 4-6 oz (112-168 g) of lean protein each day, such as lean meat, chicken, fish, eggs, or tofu. One ounce (oz) of lean protein is equal to: ? 1 oz (28 g) of meat, chicken, or fish. ? 1 egg. ?  cup (62 g) of tofu.  Eat some foods each day that contain healthy fats, such as avocado, nuts, seeds, and fish.   What foods should I eat? Fruits Berries. Apples. Oranges. Peaches. Apricots. Plums. Grapes. Mango. Papaya. Pomegranate. Kiwi. Cherries. Vegetables Lettuce. Spinach. Leafy greens, including kale, chard, collard greens, and mustard greens. Beets. Cauliflower. Cabbage. Broccoli. Carrots. Green beans. Tomatoes. Peppers. Onions. Cucumbers. Brussels sprouts. Grains Whole grains, such as whole-wheat or whole-grain bread, crackers, tortillas, cereal, and pasta. Unsweetened oatmeal. Quinoa. Brown or wild rice. Meats and other proteins Seafood. Poultry without skin. Lean cuts of poultry and beef. Tofu. Nuts. Seeds. Dairy Low-fat or fat-free dairy products such as milk, yogurt, and cheese. The items listed above may not be a complete list of foods and beverages you can eat. Contact a dietitian for more information. What foods should I avoid? Fruits Fruits canned with  syrup. Vegetables Canned vegetables. Frozen vegetables with butter or cream sauce. Grains Refined white flour and flour products such as bread, pasta, snack foods, and cereals. Avoid all processed foods. Meats and other proteins Fatty cuts of meat. Poultry with skin. Breaded or fried meats. Processed meat. Avoid saturated fats. Dairy Full-fat yogurt, cheese, or milk. Beverages Sweetened drinks, such as soda or iced tea. The items listed above may not be a complete list of foods and beverages you should avoid. Contact a dietitian for more information. Questions to ask a health care provider  Do I need to meet with a diabetes educator?  Do I need to meet with a dietitian?  What number can I call if I have questions?  When are the best times to check my blood glucose? Where to find more information:  American Diabetes Association: diabetes.org  Academy of Nutrition and Dietetics: www.eatright.org  National Institute of Diabetes and Digestive and Kidney Diseases: www.niddk.nih.gov  Association of Diabetes Care and Education Specialists: www.diabeteseducator.org Summary  It is important to have healthy eating   habits because your blood sugar (glucose) levels are greatly affected by what you eat and drink.  A healthy meal plan will help you control your blood glucose and maintain a healthy lifestyle.  Your health care provider may recommend that you work with a dietitian to make a meal plan that is best for you.  Keep in mind that carbohydrates (carbs) and alcohol have immediate effects on your blood glucose levels. It is important to count carbs and to use alcohol carefully. This information is not intended to replace advice given to you by your health care provider. Make sure you discuss any questions you have with your health care provider. Document Revised: 11/28/2018 Document Reviewed: 11/28/2018 Elsevier Patient Education  2021 Elsevier Inc.  

## 2020-03-18 NOTE — Plan of Care (Signed)

## 2020-03-18 NOTE — Progress Notes (Signed)
Shawn [name] to be D/C'd Home per MD order.  Discussed prescriptions and follow up appointments with the patient. Prescriptions were eprescribed, medication list explained in detail. Pt verbalized understanding. Sister at bedside to transport pt home.  Allergies as of 03/18/2020   No Known Allergies     Medication List    STOP taking these medications   docusate sodium 100 MG capsule Commonly known as: COLACE   polyethylene glycol 17 g packet Commonly known as: MIRALAX / GLYCOLAX     TAKE these medications   aspirin 81 MG chewable tablet Chew 1 tablet (81 mg total) by mouth daily.   atorvastatin 20 MG tablet Commonly known as: LIPITOR Take 20 mg by mouth daily.   B-Complex-C Tabs Take 1 tablet by mouth daily.   Entresto 24-26 MG Generic drug: sacubitril-valsartan TAKE 1 TABLET BY MOUTH TWICE A DAY   Exenatide ER 2 MG Pen Inject 2 mg into the skin once a week.   Farxiga 10 MG Tabs tablet Generic drug: dapagliflozin propanediol Take 10 mg by mouth daily before breakfast.   insulin aspart 100 UNIT/ML injection Commonly known as: novoLOG Inject 5 Units into the skin 3 (three) times daily with meals.   insulin glargine 100 UNIT/ML injection Commonly known as: LANTUS Inject 0.2 mLs (20 Units total) into the skin at bedtime. What changed: how much to take   omeprazole 20 MG capsule Commonly known as: PRILOSEC Take 20 mg by mouth daily.   spironolactone 25 MG tablet Commonly known as: ALDACTONE Take 25 mg by mouth daily.   torsemide 20 MG tablet Commonly known as: DEMADEX Take 20 mg by mouth daily as needed.   Vitamin D3 1.25 MG (50000 UT) Tabs Take 1 tablet by mouth daily.       Vitals:   03/18/20 0800 03/18/20 1334  BP: 138/84 (!) 132/91  Pulse: 74 71  Resp: 16 16  Temp: 98.2 F (36.8 C) 98 F (36.7 C)  SpO2: 98% 100%    Skin clean, dry and intact without evidence of skin break down, no evidence of skin tears noted. IV catheter discontinued  intact. Site without signs and symptoms of complications. Dressing and pressure applied. Pt denies pain at this time. No complaints noted.  An After Visit Summary was printed and given to the patient. Patient escorted via [place], and D/C home via private auto.  Rolley Sims

## 2020-03-19 NOTE — Discharge Summary (Signed)
[facility] [place] NAME: Shawn [name]    MR#:  [id number]  DATE OF BIRTH:  May 26, 1955  DATE OF ADMISSION:  03/16/2020   ADMITTING PHYSICIAN: Ivor Costa, MD  DATE OF DISCHARGE: 03/18/2020  3:16 PM  PRIMARY CARE PHYSICIAN: [facility]   ADMISSION DIAGNOSIS:  Dehydration [E86.0] Confusion [R41.0] Hyperosmolar hyperglycemic state (HHS) ([place]) [E11.00, E11.65] DISCHARGE DIAGNOSIS:  Principal Problem:   Hyperosmolar hyperglycemic state (HHS) ([place]) Active Problems:   Type 2 diabetes mellitus with stage 3b chronic kidney disease, with long-term current use of insulin ([name])   Smoker   Alcohol abuse   Chronic systolic CHF (congestive heart failure) ([name])   Essential hypertension   Acute renal failure superimposed on stage 3b chronic kidney disease ([name])   Diarrhea   Stroke ([name])   GERD (gastroesophageal reflux disease)   Acute metabolic encephalopathy   Elevated troponin   HLD (hyperlipidemia)   Abnormal CXR   Hypothermia   Confusion  SECONDARY DIAGNOSIS:   Past Medical History:  Diagnosis Date  . Chronic combined systolic (congestive) and diastolic (congestive) heart failure ([place])    a. TTE 6/15: EF < 20%, mildly dilated LV, DD, mildly dilated LA, mod dilated RA, mild MR, mild to mod TR, mod increased posterior wall thickness, elevated LA and LVEDP; b. 4/12019 Echo: EF 20-25%, diff HK. Gr1 DD, nl RV fxn.  . CKD (chronic kidney disease), stage II   . Diabetes mellitus with complication ([place])    a. Prior admissions w/ DKA (last 12/2017).  . Essential hypertension   . NICM (nonischemic cardiomyopathy) ([place])    a. 06/2013 Echo: EF<20%; b. 06/2013 Cath: no signif dzs; c. 04/2017 Echo: 20-25%, gr1 DD.; d.05/2018 Echo: 25-30%  . Polysubstance abuse ([place])    a. etoh and tobacco  . Stroke [facility])    HOSPITAL COURSE:  65 y.o.malewith medical history significant ofdiabetes mellitus, DKA, HHS, hypertension, hyperlipidemia, stroke, GERD,  polysubstance abuse, alcohol abuse, tobacco abuse, CHF with EF of 25-30%, CKD stage IIIb, admitted for elevated blood sugar, weakness and confusion  Hyperosmolar hyperglycemic state: POA Now resolved. Initially required insulin drip and transition to SQ  Hypokalemia Repleted  Type II DM with stage IIIb CKD with long-term current use of insulin Last A1c of 11.0 suggestive of poorly controlled diabetes  Essential hypertension Chronic systolic CHF 2D echo on [id number] showed EF of 25 to 30% Well compensated at this time  Alcohol and tobacco abuse Counseled for cessation  Acute on chronic kidney disease stage IIIb Baseline creatinine of 1.16 Creatinine on admission was 2.67 likely from dehydration, prerenal state Holding diuretics and Entresto Creatinine improved with IV hydration and back to baseline  Diarrhea reported on admission but none since then Patient had no bm while in the hospital  Stroke [facility]) -Continue aspirin, Lipitor  GERD (gastroesophageal reflux disease) -Protonix  Acute metabolic encephalopathy: multifactorial etiology, includingHHS,worsening renal function.CT head is negative for acute intracranial abnormalities. No focal neuro deficit on examination. - UDS negative for any substance of abuse. Back to baseline at DC  Elevated troponin: Due to demand ischemia.  No MI  HLD (hyperlipidemia) -Lipitor  Abnormal [id number] x-ray showed multifocal infiltration particularly on the left side.Patient does not have leukocytosis. Clinically does not seem to have pneumonia. May be due to pulmonary edema given very low EF 25-30%.Patient received 1 dose of Rocephin in ED. no further antibiotics given.  Normal procalcitonin level  Hypothermia: Body temperature 93, present on admission   Now  resolved    DISCHARGE CONDITIONS:  stable CONSULTS OBTAINED:   DRUG ALLERGIES:  No Known Allergies DISCHARGE MEDICATIONS:   Allergies as of  03/18/2020   No Known Allergies     Medication List    STOP taking these medications   docusate sodium 100 MG capsule Commonly known as: COLACE   polyethylene glycol 17 g packet Commonly known as: MIRALAX / GLYCOLAX     TAKE these medications   aspirin 81 MG chewable tablet Chew 1 tablet (81 mg total) by mouth daily.   atorvastatin 20 MG tablet Commonly known as: LIPITOR Take 20 mg by mouth daily.   B-Complex-C Tabs Take 1 tablet by mouth daily.   Entresto 24-26 MG Generic drug: sacubitril-valsartan TAKE 1 TABLET BY MOUTH TWICE A DAY   Exenatide ER 2 MG Pen Inject 2 mg into the skin once a week.   Farxiga 10 MG Tabs tablet Generic drug: dapagliflozin propanediol Take 10 mg by mouth daily before breakfast.   insulin aspart 100 UNIT/ML injection Commonly known as: novoLOG Inject 5 Units into the skin 3 (three) times daily with meals.   insulin glargine 100 UNIT/ML injection Commonly known as: LANTUS Inject 0.2 mLs (20 Units total) into the skin at bedtime. What changed: how much to take   omeprazole 20 MG capsule Commonly known as: PRILOSEC Take 20 mg by mouth daily.   spironolactone 25 MG tablet Commonly known as: ALDACTONE Take 25 mg by mouth daily.   torsemide 20 MG tablet Commonly known as: DEMADEX Take 20 mg by mouth daily as needed.   Vitamin D3 1.25 MG (50000 UT) Tabs Take 1 tablet by mouth daily.      DISCHARGE INSTRUCTIONS:   DIET:  Diabetic diet DISCHARGE CONDITION:  Stable ACTIVITY:  Activity as tolerated OXYGEN:  Home Oxygen: No.  Oxygen Delivery: room air DISCHARGE LOCATION:  home   If you experience worsening of your admission symptoms, develop shortness of breath, life threatening emergency, suicidal or homicidal thoughts you must seek medical attention immediately by calling 911 or calling your MD immediately  if symptoms less severe.  You Must read complete instructions/literature along with all the possible adverse  reactions/side effects for all the Medicines you take and that have been prescribed to you. Take any new Medicines after you have completely understood and accpet all the possible adverse reactions/side effects.   Please note  You were cared for by a hospitalist during your hospital stay. If you have any questions about your discharge medications or the care you received while you were in the hospital after you are discharged, you can call the unit and asked to speak with the hospitalist on call if the hospitalist that took care of you is not available. Once you are discharged, your primary care physician will handle any further medical issues. Please note that NO REFILLS for any discharge medications will be authorized once you are discharged, as it is imperative that you return to your primary care physician (or establish a relationship with a primary care physician if you do not have one) for your aftercare needs so that they can reassess your need for medications and monitor your lab values.    On the day of Discharge:  VITAL SIGNS:  Blood pressure (!) 132/91, pulse 71, temperature 98 F (36.7 C), temperature source Oral, resp. rate 16, height 6' (1.829 m), weight 60.2 kg, SpO2 100 %. PHYSICAL EXAMINATION:  GENERAL:  65 y.o.-year-old patient lying in the bed with no acute  distress.  EYES: Pupils equal, round, reactive to light and accommodation. No scleral icterus. Extraocular muscles intact.  HEENT: Head atraumatic, normocephalic. Oropharynx and nasopharynx clear.  NECK:  Supple, no jugular venous distention. No thyroid enlargement, no tenderness.  LUNGS: Normal breath sounds bilaterally, no wheezing, rales,rhonchi or crepitation. No use of accessory muscles of respiration.  CARDIOVASCULAR: S1, S2 normal. No murmurs, rubs, or gallops.  ABDOMEN: Soft, non-tender, non-distended. Bowel sounds present. No organomegaly or mass.  EXTREMITIES: No pedal edema, cyanosis, or clubbing.  NEUROLOGIC:  Cranial nerves II through XII are intact. Muscle strength 5/5 in all extremities. Sensation intact. Gait not checked.  PSYCHIATRIC: The patient is alert and oriented x 3.  SKIN: No obvious rash, lesion, or ulcer.  DATA REVIEW:   CBC Recent Labs  Lab 03/18/20 0554  WBC 11.8*  HGB 11.8*  HCT 35.6*  PLT 315    Chemistries  Recent Labs  Lab 03/16/20 1008 03/16/20 1550 03/17/20 0420 03/18/20 0554  NA 122*   < > 140 136  K 4.1   < > 3.0* 5.2*  CL 81*   < > 96* 98  CO2 22   < > 31 31  GLUCOSE 1,862*   < > 273* 295*  BUN 80*   < > 70* 52*  CREATININE 2.67*   < > 1.89* 1.35*  CALCIUM 8.2*   < > 9.1 8.8*  MG 2.3  --  2.2  --   AST 20  --   --   --   ALT 16  --   --   --   ALKPHOS 108  --   --   --   BILITOT 1.1  --   --   --    < > = values in this interval not displayed.     Outpatient follow-up  Follow-up [place] in 1 week.   Specialty: General Practice Why: Administrator, Civil Service information: [address] [place] [address] [phone]        Minna Merritts, MD. Go on 03/28/2020.   Specialty: Cardiology Why: 10:30am appointment Contact information: [place] [address] [phone]        Lavonia Dana, MD. Go on 03/24/2020.   Specialty: Nephrology Why: 12 appointment Contact information: [address] [place] [address] [phone]               58 Day Unplanned Readmission Risk Score   Flowsheet Row ED to Hosp-Admission (Discharged) from 03/16/2020 in [place]  30 Day Unplanned Readmission Risk Score (%) 30.48 Filed at 03/18/2020 1200     This score is the patient's risk of an unplanned readmission within 30 days of being discharged (0 -100%). The score is based on dignosis, age, lab data, medications, orders, and past utilization.   Low:  0-14.9   Medium: 15-21.9   High: 22-29.9   Extreme: 30 and above          Management plans discussed with the patient, family and they are in agreement.  CODE STATUS: Prior   TOTAL TIME TAKING CARE OF THIS PATIENT: 45 minutes.    Max Sane M.D on 03/19/2020 at 7:47 PM  [facility]   CC: Primary care physician; [facility], [place]   Note: This dictation was prepared with Dragon dictation along with smaller phrase technology. Any transcriptional errors that result from this process are unintentional.

## 2020-03-21 ENCOUNTER — Other Ambulatory Visit: Payer: Self-pay | Admitting: Family

## 2020-03-21 LAB — CULTURE, BLOOD (SINGLE)
Culture: NO GROWTH
Special Requests: ADEQUATE

## 2020-03-28 ENCOUNTER — Ambulatory Visit: Payer: No Typology Code available for payment source | Admitting: Physician Assistant

## 2020-04-08 ENCOUNTER — Ambulatory Visit: Payer: Medicaid Other | Admitting: Physician Assistant

## 2020-04-16 ENCOUNTER — Ambulatory Visit: Payer: No Typology Code available for payment source | Admitting: Family

## 2020-04-16 ENCOUNTER — Telehealth: Payer: Self-pay | Admitting: Family

## 2020-04-16 NOTE — Telephone Encounter (Signed)
Patients home phone is no longer working so I left vm on patients cellphone regarding his no show CHF Clinic appointment for today and to let him know I rescheduled it for him for 5/3.   Enjoli Tidd, NT

## 2020-04-16 NOTE — Progress Notes (Deleted)
Patient ID: Shawn [name], male    DOB: 05-Nov-1955, 65 y.o.   MRN: [id number]  HPI  Mr Jannusch is a 65 y/o male with a history of DM, HTN, CKD, stroke, current tobacco use and chronic heart failure.   Echo report from 05/11/2018 reviewed and showed an EF of 25-30% along with mild/moderate MR.   Admitted 03/16/20 due to    He presents today for a follow-up visit with a chief complaint of   Past Medical History:  Diagnosis Date  . Chronic combined systolic (congestive) and diastolic (congestive) heart failure ([place])    a. TTE 6/15: EF < 20%, mildly dilated LV, DD, mildly dilated LA, mod dilated RA, mild MR, mild to mod TR, mod increased posterior wall thickness, elevated LA and LVEDP; b. 4/12019 Echo: EF 20-25%, diff HK. Gr1 DD, nl RV fxn.  . CKD (chronic kidney disease), stage II   . Diabetes mellitus with complication ([place])    a. Prior admissions w/ DKA (last 12/2017).  . Essential hypertension   . NICM (nonischemic cardiomyopathy) ([place])    a. 06/2013 Echo: EF<20%; b. 06/2013 Cath: no signif dzs; c. 04/2017 Echo: 20-25%, gr1 DD.; d.05/2018 Echo: 25-30%  . Polysubstance abuse ([place])    a. etoh and tobacco  . Stroke [facility])    Past Surgical History:  Procedure Laterality Date  . NO PAST SURGERIES     Family History  Problem Relation Age of Onset  . Congestive Heart Failure Mother   . Diabetes Mother   . Congestive Heart Failure Brother    Social History   Tobacco Use  . Smoking status: Current Every Day Smoker    Packs/day: 0.50    Types: Cigarettes  . Smokeless tobacco: Never Used  Substance Use Topics  . Alcohol use: Not Currently   No Known Allergies    Review of Systems  Constitutional: Positive for fatigue. Negative for appetite change.  HENT: Negative for congestion, postnasal drip, sneezing and sore throat.   Eyes: Negative.   Respiratory: Negative for cough, chest tightness and shortness of breath.   Cardiovascular: Negative for chest pain, palpitations and  leg swelling.  Gastrointestinal: Positive for constipation. Negative for abdominal distention and abdominal pain.  Endocrine: Negative.   Genitourinary: Negative.   Musculoskeletal: Negative for back pain, myalgias and neck pain.  Skin: Negative.   Allergic/Immunologic: Negative.   Neurological: Positive for weakness (legs are improving). Negative for dizziness and light-headedness.  Hematological: Negative for adenopathy. Does not bruise/bleed easily.  Psychiatric/Behavioral: Negative for dysphoric mood and sleep disturbance (sleeping on 2 pillows). The patient is not nervous/anxious.        Physical Exam Vitals and nursing note reviewed.  Constitutional:      Appearance: Normal appearance.  HENT:     Head: Normocephalic and atraumatic.  Cardiovascular:     Rate and Rhythm: Normal rate and regular rhythm.  Pulmonary:     Effort: Pulmonary effort is normal.     Breath sounds: Normal breath sounds. No wheezing or rales.  Abdominal:     General: There is distension.     Tenderness: There is no abdominal tenderness.  Musculoskeletal:        General: No tenderness.     Cervical back: Normal range of motion and neck supple.     Right lower leg: No edema.     Left lower leg: Edema (trace pitting) present.  Skin:    General: Skin is warm and dry.  Neurological:  General: No focal deficit present.     Mental Status: He is alert and oriented to person, place, and time.  Psychiatric:        Mood and Affect: Mood normal.        Behavior: Behavior normal.        Thought Content: Thought content normal.    Assessment & Plan:  1: Chronic heart failure with reduced ejection fraction- - NYHA class II - euvolemic today - weighing daily; Reminded to call for an overnight weight gain of >2 pounds or a weekly weight gain of >5 pounds - weight 151.8 pounds from last visit here 8 months ago  - not adding salt  - saw cardiology Mickle Plumb) 06/28/19  - BNP 03/16/20 was 1104.6 - reports  receiving both COVID vaccines  2: HTN- - BP  - sees PCP at the [place] on 08/16/19 - BMP 03/18/20 reviewed and showed sodium 136, potassium 5.2, creatinine 1.35 and GFR 59  3: DM- - saw endocrinology Pasty Arch) 03/15/19 - A1c 03/17/20 was 12.6% - home glucose this morning was  - saw nephrology([place]) 03/13/19   Patient did not bring his medications nor a list. Each medication was verbally reviewed with the patient and he was encouraged to bring the bottles to every visit to confirm accuracy of list.

## 2020-04-16 NOTE — Telephone Encounter (Signed)
Patient did not show for his Heart Failure Clinic appointment on 04/16/20 Will attempt to reschedule.  

## 2020-05-02 ENCOUNTER — Telehealth: Payer: Self-pay | Admitting: Family

## 2020-05-02 NOTE — Telephone Encounter (Signed)
Spoke to patient who asked me to cancel all future appointments as he has gone back to the [place] for care and no longer wants to be seen by [place].   Evamarie Raetz, NT

## 2020-05-06 ENCOUNTER — Ambulatory Visit: Payer: Medicaid Other | Admitting: Family

## 2020-05-19 ENCOUNTER — Other Ambulatory Visit: Payer: Self-pay | Admitting: Family

## 2021-01-05 ENCOUNTER — Inpatient Hospital Stay
Admission: EM | Admit: 2021-01-05 | Discharge: 2021-01-09 | DRG: 637 | Disposition: A | Discharge status: Skilled Nursing Facility | Payer: Medicaid Other | Attending: Internal Medicine | Admitting: Internal Medicine

## 2021-01-05 ENCOUNTER — Encounter: Payer: Self-pay | Admitting: Pharmacy Technician

## 2021-01-05 ENCOUNTER — Emergency Department: Payer: Medicaid Other

## 2021-01-05 DIAGNOSIS — Z79899 Other long term (current) drug therapy: Secondary | ICD-10-CM | POA: Diagnosis not present

## 2021-01-05 DIAGNOSIS — E1100 Type 2 diabetes mellitus with hyperosmolarity without nonketotic hyperglycemic-hyperosmolar coma (NKHHC): Secondary | ICD-10-CM | POA: Diagnosis not present

## 2021-01-05 DIAGNOSIS — D631 Anemia in chronic kidney disease: Secondary | ICD-10-CM | POA: Diagnosis present

## 2021-01-05 DIAGNOSIS — R739 Hyperglycemia, unspecified: Secondary | ICD-10-CM

## 2021-01-05 DIAGNOSIS — I428 Other cardiomyopathies: Secondary | ICD-10-CM | POA: Diagnosis present

## 2021-01-05 DIAGNOSIS — E1110 Type 2 diabetes mellitus with ketoacidosis without coma: Secondary | ICD-10-CM | POA: Diagnosis not present

## 2021-01-05 DIAGNOSIS — Z7984 Long term (current) use of oral hypoglycemic drugs: Secondary | ICD-10-CM | POA: Diagnosis not present

## 2021-01-05 DIAGNOSIS — Z7982 Long term (current) use of aspirin: Secondary | ICD-10-CM

## 2021-01-05 DIAGNOSIS — E8729 Other acidosis: Secondary | ICD-10-CM | POA: Diagnosis not present

## 2021-01-05 DIAGNOSIS — E875 Hyperkalemia: Secondary | ICD-10-CM

## 2021-01-05 DIAGNOSIS — Z794 Long term (current) use of insulin: Secondary | ICD-10-CM | POA: Diagnosis not present

## 2021-01-05 DIAGNOSIS — Z789 Other specified health status: Secondary | ICD-10-CM | POA: Diagnosis not present

## 2021-01-05 DIAGNOSIS — E872 Acidosis, unspecified: Secondary | ICD-10-CM | POA: Diagnosis present

## 2021-01-05 DIAGNOSIS — K219 Gastro-esophageal reflux disease without esophagitis: Secondary | ICD-10-CM | POA: Diagnosis present

## 2021-01-05 DIAGNOSIS — I10 Essential (primary) hypertension: Secondary | ICD-10-CM | POA: Diagnosis present

## 2021-01-05 DIAGNOSIS — Z8249 Family history of ischemic heart disease and other diseases of the circulatory system: Secondary | ICD-10-CM | POA: Diagnosis not present

## 2021-01-05 DIAGNOSIS — G9341 Metabolic encephalopathy: Secondary | ICD-10-CM | POA: Diagnosis not present

## 2021-01-05 DIAGNOSIS — Z8673 Personal history of transient ischemic attack (TIA), and cerebral infarction without residual deficits: Secondary | ICD-10-CM

## 2021-01-05 DIAGNOSIS — I5023 Acute on chronic systolic (congestive) heart failure: Secondary | ICD-10-CM | POA: Diagnosis present

## 2021-01-05 DIAGNOSIS — F17200 Nicotine dependence, unspecified, uncomplicated: Secondary | ICD-10-CM | POA: Diagnosis present

## 2021-01-05 DIAGNOSIS — N179 Acute kidney failure, unspecified: Secondary | ICD-10-CM | POA: Diagnosis present

## 2021-01-05 DIAGNOSIS — F17210 Nicotine dependence, cigarettes, uncomplicated: Secondary | ICD-10-CM | POA: Diagnosis present

## 2021-01-05 DIAGNOSIS — R110 Nausea: Secondary | ICD-10-CM | POA: Diagnosis not present

## 2021-01-05 DIAGNOSIS — R7401 Elevation of levels of liver transaminase levels: Secondary | ICD-10-CM | POA: Diagnosis not present

## 2021-01-05 DIAGNOSIS — Z9114 Patient's other noncompliance with medication regimen: Secondary | ICD-10-CM

## 2021-01-05 DIAGNOSIS — R001 Bradycardia, unspecified: Secondary | ICD-10-CM | POA: Diagnosis not present

## 2021-01-05 DIAGNOSIS — E1122 Type 2 diabetes mellitus with diabetic chronic kidney disease: Secondary | ICD-10-CM | POA: Diagnosis not present

## 2021-01-05 DIAGNOSIS — Z515 Encounter for palliative care: Secondary | ICD-10-CM | POA: Diagnosis not present

## 2021-01-05 DIAGNOSIS — F1910 Other psychoactive substance abuse, uncomplicated: Secondary | ICD-10-CM | POA: Diagnosis present

## 2021-01-05 DIAGNOSIS — E1165 Type 2 diabetes mellitus with hyperglycemia: Secondary | ICD-10-CM | POA: Diagnosis not present

## 2021-01-05 DIAGNOSIS — E876 Hypokalemia: Secondary | ICD-10-CM | POA: Diagnosis present

## 2021-01-05 DIAGNOSIS — I130 Hypertensive heart and chronic kidney disease with heart failure and stage 1 through stage 4 chronic kidney disease, or unspecified chronic kidney disease: Secondary | ICD-10-CM | POA: Diagnosis present

## 2021-01-05 DIAGNOSIS — Z833 Family history of diabetes mellitus: Secondary | ICD-10-CM

## 2021-01-05 DIAGNOSIS — I5022 Chronic systolic (congestive) heart failure: Secondary | ICD-10-CM | POA: Diagnosis not present

## 2021-01-05 DIAGNOSIS — I5042 Chronic combined systolic (congestive) and diastolic (congestive) heart failure: Secondary | ICD-10-CM | POA: Diagnosis present

## 2021-01-05 DIAGNOSIS — I959 Hypotension, unspecified: Secondary | ICD-10-CM | POA: Diagnosis not present

## 2021-01-05 DIAGNOSIS — Z20822 Contact with and (suspected) exposure to covid-19: Secondary | ICD-10-CM | POA: Diagnosis present

## 2021-01-05 DIAGNOSIS — N1832 Chronic kidney disease, stage 3b: Secondary | ICD-10-CM | POA: Diagnosis present

## 2021-01-05 DIAGNOSIS — E119 Type 2 diabetes mellitus without complications: Secondary | ICD-10-CM

## 2021-01-05 DIAGNOSIS — R4182 Altered mental status, unspecified: Secondary | ICD-10-CM | POA: Diagnosis not present

## 2021-01-05 DIAGNOSIS — R531 Weakness: Secondary | ICD-10-CM | POA: Diagnosis not present

## 2021-01-05 LAB — BLOOD GAS, VENOUS
Acid-base deficit: 19.7 mmol/L — ABNORMAL HIGH (ref 0.0–2.0)
Bicarbonate: 8.1 mmol/L — ABNORMAL LOW (ref 20.0–28.0)
O2 Saturation: 84.1 %
Patient temperature: 37
pCO2, Ven: 25 mmHg — ABNORMAL LOW (ref 44.0–60.0)
pH, Ven: 7.12 — CL (ref 7.250–7.430)
pO2, Ven: 66 mmHg — ABNORMAL HIGH (ref 32.0–45.0)

## 2021-01-05 LAB — COMPREHENSIVE METABOLIC PANEL
ALT: 33 U/L (ref 0–44)
AST: 50 U/L — ABNORMAL HIGH (ref 15–41)
Albumin: 2.8 g/dL — ABNORMAL LOW (ref 3.5–5.0)
Alkaline Phosphatase: 134 U/L — ABNORMAL HIGH (ref 38–126)
Anion gap: 36 — ABNORMAL HIGH (ref 5–15)
BUN: 93 mg/dL — ABNORMAL HIGH (ref 8–23)
CO2: 8 mmol/L — ABNORMAL LOW (ref 22–32)
Calcium: 8.5 mg/dL — ABNORMAL LOW (ref 8.9–10.3)
Chloride: 87 mmol/L — ABNORMAL LOW (ref 98–111)
Creatinine, Ser: 2.43 mg/dL — ABNORMAL HIGH (ref 0.61–1.24)
GFR, Estimated: 29 mL/min — ABNORMAL LOW (ref >60)
Glucose, Bld: 1016 mg/dL (ref 70–99)
Potassium: 5.5 mmol/L — ABNORMAL HIGH (ref 3.5–5.1)
Sodium: 131 mmol/L — ABNORMAL LOW (ref 135–145)
Total Bilirubin: 2.9 mg/dL — ABNORMAL HIGH (ref 0.3–1.2)
Total Protein: 6.4 g/dL — ABNORMAL LOW (ref 6.5–8.1)

## 2021-01-05 LAB — CBG MONITORING, ED
Glucose-Capillary: >600 mg/dL (ref 70–99)
Glucose-Capillary: >600 mg/dL (ref 70–99)
Glucose-Capillary: >600 mg/dL (ref 70–99)
Glucose-Capillary: >600 mg/dL (ref 70–99)
Glucose-Capillary: >600 mg/dL (ref 70–99)
Glucose-Capillary: >600 mg/dL (ref 70–99)

## 2021-01-05 LAB — BASIC METABOLIC PANEL
Anion gap: 30 — ABNORMAL HIGH (ref 5–15)
BUN: 95 mg/dL — ABNORMAL HIGH (ref 8–23)
CO2: 13 mmol/L — ABNORMAL LOW (ref 22–32)
Calcium: 8.5 mg/dL — ABNORMAL LOW (ref 8.9–10.3)
Chloride: 90 mmol/L — ABNORMAL LOW (ref 98–111)
Creatinine, Ser: 2.55 mg/dL — ABNORMAL HIGH (ref 0.61–1.24)
GFR, Estimated: 27 mL/min — ABNORMAL LOW (ref >60)
Glucose, Bld: 885 mg/dL (ref 70–99)
Potassium: 4.4 mmol/L (ref 3.5–5.1)
Sodium: 133 mmol/L — ABNORMAL LOW (ref 135–145)

## 2021-01-05 LAB — URINALYSIS, COMPLETE (UACMP) WITH MICROSCOPIC
Bilirubin Urine: NEGATIVE
Glucose, UA: >=500 mg/dL — AB
Ketones, ur: 20 mg/dL — AB
Leukocytes,Ua: NEGATIVE
Nitrite: NEGATIVE
Protein, ur: NEGATIVE mg/dL
Specific Gravity, Urine: 1.018 (ref 1.005–1.030)
pH: 5 (ref 5.0–8.0)

## 2021-01-05 LAB — LACTIC ACID, PLASMA
Lactic Acid, Venous: 1.8 mmol/L (ref 0.5–1.9)
Lactic Acid, Venous: 1.7 mmol/L (ref 0.5–1.9)

## 2021-01-05 LAB — CBC WITH DIFFERENTIAL/PLATELET
Abs Immature Granulocytes: 0.03 10*3/uL (ref 0.00–0.07)
Basophils Absolute: 0 10*3/uL (ref 0.0–0.1)
Basophils Relative: 0 %
Eosinophils Absolute: 0.2 10*3/uL (ref 0.0–0.5)
Eosinophils Relative: 2 %
HCT: 36.6 % — ABNORMAL LOW (ref 39.0–52.0)
Hemoglobin: 11.2 g/dL — ABNORMAL LOW (ref 13.0–17.0)
Immature Granulocytes: 0 %
Lymphocytes Relative: 8 %
Lymphs Abs: 0.7 10*3/uL (ref 0.7–4.0)
MCH: 30.9 pg (ref 26.0–34.0)
MCHC: 30.6 g/dL (ref 30.0–36.0)
MCV: 101.1 fL — ABNORMAL HIGH (ref 80.0–100.0)
Monocytes Absolute: 0.4 10*3/uL (ref 0.1–1.0)
Monocytes Relative: 5 %
Neutro Abs: 7.6 10*3/uL (ref 1.7–7.7)
Neutrophils Relative %: 85 %
Platelets: 298 10*3/uL (ref 150–400)
RBC: 3.62 MIL/uL — ABNORMAL LOW (ref 4.22–5.81)
RDW: 15.1 % (ref 11.5–15.5)
WBC: 9 10*3/uL (ref 4.0–10.5)
nRBC: 0 % (ref 0.0–0.2)

## 2021-01-05 LAB — RESP PANEL BY RT-PCR (FLU A&B, COVID) ARPGX2
Influenza A by PCR: NEGATIVE
Influenza B by PCR: NEGATIVE
SARS Coronavirus 2 by RT PCR: NEGATIVE

## 2021-01-05 LAB — OSMOLALITY: Osmolality: 385 mOsm/kg (ref 275–295)

## 2021-01-05 LAB — BETA-HYDROXYBUTYRIC ACID: Beta-Hydroxybutyric Acid: >8 mmol/L — ABNORMAL HIGH (ref 0.05–0.27)

## 2021-01-05 MED ORDER — LACTATED RINGERS IV BOLUS
1000.0000 mL | Freq: Once | INTRAVENOUS | Status: AC
  Administered 2021-01-05: 1000 mL via INTRAVENOUS

## 2021-01-05 MED ORDER — INSULIN REGULAR(HUMAN) IN NACL 100-0.9 UT/100ML-% IV SOLN: INTRAVENOUS | Status: DC

## 2021-01-05 MED ORDER — ACETAMINOPHEN 650 MG RE SUPP: 650.0000 mg | Freq: Four times a day (QID) | RECTAL | Status: DC | PRN

## 2021-01-05 MED ORDER — DEXTROSE 50 % IV SOLN: 0.0000 mL | INTRAVENOUS | Status: DC | PRN

## 2021-01-05 MED ORDER — ONDANSETRON HCL 4 MG/2ML IJ SOLN: 4.0000 mg | Freq: Four times a day (QID) | INTRAMUSCULAR | Status: DC | PRN

## 2021-01-05 MED ORDER — PANTOPRAZOLE SODIUM 40 MG IV SOLR
40.0000 mg | INTRAVENOUS | Status: DC
  Administered 2021-01-05 – 2021-01-08 (×4): 40 mg via INTRAVENOUS
  Filled 2021-01-05 (×5): qty 40

## 2021-01-05 MED ORDER — ENOXAPARIN SODIUM 30 MG/0.3ML IJ SOSY
30.0000 mg | PREFILLED_SYRINGE | INTRAMUSCULAR | Status: DC
  Administered 2021-01-05: 30 mg via SUBCUTANEOUS
  Filled 2021-01-05: qty 0.3

## 2021-01-05 MED ORDER — ONDANSETRON HCL 4 MG PO TABS: 4.0000 mg | ORAL_TABLET | Freq: Four times a day (QID) | ORAL | Status: DC | PRN

## 2021-01-05 MED ORDER — INSULIN REGULAR(HUMAN) IN NACL 100-0.9 UT/100ML-% IV SOLN
INTRAVENOUS | Status: DC
  Administered 2021-01-05: 7.5 [IU]/h via INTRAVENOUS
  Filled 2021-01-05: qty 100

## 2021-01-05 MED ORDER — LACTATED RINGERS IV SOLN: INTRAVENOUS | Status: DC

## 2021-01-05 MED ORDER — DEXTROSE IN LACTATED RINGERS 5 % IV SOLN: INTRAVENOUS | Status: DC

## 2021-01-05 MED ORDER — ACETAMINOPHEN 325 MG PO TABS
650.0000 mg | ORAL_TABLET | Freq: Four times a day (QID) | ORAL | Status: DC | PRN
  Administered 2021-01-07: 650 mg via ORAL
  Filled 2021-01-05: qty 2

## 2021-01-05 MED ORDER — INSULIN REGULAR(HUMAN) IN NACL 100-0.9 UT/100ML-% IV SOLN
INTRAVENOUS | Status: DC
  Administered 2021-01-05: 7.5 [IU]/h via INTRAVENOUS
  Administered 2021-01-06: 13 [IU]/h via INTRAVENOUS
  Filled 2021-01-05: qty 100

## 2021-01-05 MED ORDER — POLYETHYLENE GLYCOL 3350 17 G PO PACK: 17.0000 g | PACK | Freq: Every day | ORAL | Status: DC | PRN

## 2021-01-05 MED ORDER — ASPIRIN 81 MG PO CHEW
81.0000 mg | CHEWABLE_TABLET | Freq: Every day | ORAL | Status: DC
  Administered 2021-01-06 – 2021-01-09 (×4): 81 mg via ORAL
  Filled 2021-01-05 (×4): qty 1

## 2021-01-05 MED ORDER — ATORVASTATIN CALCIUM 20 MG PO TABS
20.0000 mg | ORAL_TABLET | Freq: Every day | ORAL | Status: DC
  Administered 2021-01-06 – 2021-01-09 (×4): 20 mg via ORAL
  Filled 2021-01-05 (×4): qty 1

## 2021-01-05 MED ORDER — ENOXAPARIN SODIUM 40 MG/0.4ML IJ SOSY: 40.0000 mg | PREFILLED_SYRINGE | INTRAMUSCULAR | Status: DC

## 2021-01-05 MED ORDER — OXYCODONE HCL 5 MG PO TABS: 5.0000 mg | ORAL_TABLET | ORAL | Status: DC | PRN

## 2021-01-05 NOTE — ED Triage Notes (Signed)
Pt bib ems from home with weakness, nausea. Family reports pt altered, decreased PO intake and has laid in bed X4 days covered in urine and feces. Family reports pt has not had any medications X4 days.  HR 74 NSR with prolonged qtc.  550 CBG 93/57 20g RFA with 500cc NS en route.

## 2021-01-05 NOTE — ED Notes (Signed)
Pt placed on Bair Hugger 

## 2021-01-05 NOTE — ED Provider Notes (Signed)
[facility] Provider Note    Event Date/Time   First MD Initiated Contact with Patient 01/05/21 1401     (approximate)   History   No chief complaint on file.   HPI  Shawn [name] is a 66 y.o. male presents to the ED for evaluation of weakness, nausea.  I review [facility] documentation from office visit on 11/9. History of HTN, CKD, CHF, DM.  Patient presents to the ED via EMS from home for evaluation of increasing weakness, dizziness, emesis and poor appetite.  They report he has been eating less and laying up in bed without ambulation for a few days.  He has not been taking his medications and EMS found him soiled with urine and feces.  EMS reports soft pressures on arrival improving with IV fluids.  Here in the ED, patient is confused and repetitive.  Denies any pain and repeatedly asking for his feet to be covered by blanket. History somewhat limited   Physical Exam   Triage Vital Signs: ED Triage Vitals  Enc Vitals Group     BP 01/05/21 1405 (!) 102/59     Pulse Rate 01/05/21 1407 71     Resp 01/05/21 1407 14     Temp 01/05/21 1415 (!) 94 F (34.4 C)     Temp Source 01/05/21 1415 Rectal     SpO2 01/05/21 1407 100 %     Weight --      Height --      Head Circumference --      Peak Flow --      Pain Score 01/05/21 1407 0     Pain Loc --      Pain Edu? --      Excl. in [place]? --     Most recent vital signs: Vitals:   01/05/21 1430 01/05/21 1500  BP: (!) 95/59 (!) 98/57  Pulse: 68 66  Resp: 16 18  Temp:    SpO2: 97% 99%    General: Awake, no distress.  Confused and slow to respond.  Repetitive. CV:  Good peripheral perfusion.  Resp:  Normal effort.  Abd:  No distention or tenderness. Other:  Cranial nerves II through XII intact 5/5 strength and sensation in all 4 extremities  ED Results / Procedures / Treatments   Labs (all labs ordered are listed, but only abnormal results are displayed) Labs Reviewed  CBC WITH  DIFFERENTIAL/PLATELET - Abnormal; Notable for the following components:      Result Value   RBC 3.62 (*)    Hemoglobin 11.2 (*)    HCT 36.6 (*)    MCV 101.1 (*)    All other components within normal limits  URINE CULTURE  CULTURE, BLOOD (SINGLE)  RESP PANEL BY RT-PCR (FLU A&B, COVID) ARPGX2  LACTIC ACID, PLASMA  LACTIC ACID, PLASMA  COMPREHENSIVE METABOLIC PANEL  URINALYSIS, COMPLETE (UACMP) WITH MICROSCOPIC  BLOOD GAS, VENOUS  BETA-HYDROXYBUTYRIC ACID    EKG Sinus rhythm, rate of 71 bpm.  Axis and intervals.  No STEMI.  1 PVC.  RADIOLOGY 1 view CXR reviewed by me with mild interstitial edema.  Official radiology report(s): DG Chest Port 1 View  Result Date: 01/05/2021 CLINICAL DATA:  Nausea. EXAM: PORTABLE CHEST 1 VIEW COMPARISON:  March 16, 2020 FINDINGS: The heart size and mediastinal contours are stable. The aorta is tortuous. Mild increased pulmonary interstitium is identified bilaterally. The lung volumes are low. There is no pleural effusion or focal pneumonia. The visualized skeletal  structures are unremarkable. IMPRESSION: Mild interstitial edema. Electronically Signed   By: Abelardo Diesel M.D.   On: 01/05/2021 15:06    PROCEDURES and INTERVENTIONS:  .Critical Care Performed by: Vladimir Crofts, MD Authorized by: Vladimir Crofts, MD   Critical care provider statement:    Critical care time (minutes):  30   Critical care time was exclusive of:  Separately billable procedures and treating other patients   Critical care was necessary to treat or prevent imminent or life-threatening deterioration of the following conditions:  CNS failure or compromise and dehydration   Critical care was time spent personally by me on the following activities:  Development of treatment plan with patient or surrogate, discussions with consultants, evaluation of patient's response to treatment, examination of patient, ordering and review of laboratory studies, ordering and review of radiographic  studies, ordering and performing treatments and interventions, pulse oximetry, re-evaluation of patient's condition and review of old charts  Medications  lactated ringers bolus 1,000 mL (1,000 mLs Intravenous New Bag/Given 01/05/21 1444)     IMPRESSION / MDM / [place] / ED COURSE  I reviewed the triage vital signs and the nursing notes.  66 year old male presents to the ED with altered mentation and signs of dehydration, concerning for hyperglycemic metabolic crisis.  He presents GCS 13, but neurologically intact without focal deficits, no signs of trauma and no signs of vascular deficits.  Awaiting CT head to evaluate for ICH, low suspicion for CVA.  He appears dry and his fingerstick is greater than 500, clinical syndrome is concerning for DKA versus HHS primarily.  He will be signed out to oncoming provider and anticipate will require medical admission.  Clinical Course as of 01/05/21 1530  Mon Jan 05, 2021  1528 Reassessed and discussed with sister who is now at the bedside.  Patient is still confused and unable to answer any relevant questions.  She confirms that he has just been "laid up" in bed for the past few days, not eating, not drinking and not taking his medications.  No known falls, syncopal episodes or acute events.  She reports that she called 911 today because "enough is enough." [DS]    Clinical Course User Index [DS] Vladimir Crofts, MD     FINAL CLINICAL IMPRESSION(S) / ED DIAGNOSES   Final diagnoses:  Hyperglycemia  Altered mental status, unspecified altered mental status type     Rx / DC Orders   ED Discharge Orders     None        Note:  This document was prepared using Dragon voice recognition software and may include unintentional dictation errors.   Vladimir Crofts, MD 01/05/21 1534

## 2021-01-05 NOTE — ED Notes (Signed)
Pt had BM gown, linen, and pad changed.

## 2021-01-05 NOTE — ED Provider Notes (Addendum)
----------------------------------------- °  4:36 PM on 01/05/2021 -----------------------------------------  I took over care of this patient from Dr. Tamala Julian.  Work-up reveals glucose over 1000, pH 7.12, anion gap 36.  I initiated an insulin infusion and fluid bolus for treatment of DKA/HHS.    I consulted Dr. Lanney Gins from the ICU and discussed the case with him.  He advised that based on the patient's mental status and vital signs he would be appropriate for admission to the progressive unit under the hospitalist.  I then consulted Dr. Dione Plover from the hospitalist service and discussed the case with him; he accepted the patient as an admission.    Arta Silence, MD 01/05/21 1637    Arta Silence, MD 01/05/21 [phone]

## 2021-01-05 NOTE — ED Notes (Signed)
Pt had large BM. Bed changed, linen changed gown changed and pt placed in brief

## 2021-01-05 NOTE — H&P (Addendum)
[facility] History and Physical  RUDRANSH BELLANCA JOI:[id number] DOB: [birth year] DOA: 01/05/2021  Referring physician: Dr. Cherylann Banas PCP: [facility]   Chief Complaint: confusion  HPI: Shawn [name] is a 66 y.o. male with hx of poorly controlled type 2 diabetes with multiple episodes of hyperglycemic crises, chronic systolic CHF, polysubstance use disorder, hypertension, who presents via EMS for confusion.  Patient unable to participate in interview in any significant manner due to altered mental status.  Per ED signout patient was brought from home after sister called EMS reporting that he had been in bed for 4 days without really getting up and not eating or drinking or taking any of his medications.  In the ED initial vital signs notable for temperature of 94 F, blood pressures of 90s over 50s, remainder of vitals unremarkable.  Lab work-up notable for CMP showing glucose of 1016, creatinine 2.4, potassium 5.5, CO2 of 8, anion gap 36, BUN 93, AST 50, T bili 2.9; CBC showed mild anemia of 11.2 but otherwise normal, lactic acid was normal at 1.8, COVID test was negative.  CT head without contrast was obtained due to altered mental status and had no acute findings.  Chest x-ray showed mild interstitial edema.  Case was discussed with ICU however was felt to be stable enough for progressive care.  He was given IV fluids, started on an insulin drip, and admitted for further management.  Review of Systems:  Pertinent positives and negative per HPI, all others reviewed and negative  Past Medical History:  Diagnosis Date   Chronic combined systolic (congestive) and diastolic (congestive) heart failure ([place])    a. TTE 6/15: EF < 20%, mildly dilated LV, DD, mildly dilated LA, mod dilated RA, mild MR, mild to mod TR, mod increased posterior wall thickness, elevated LA and LVEDP; b. 4/12019 Echo: EF 20-25%, diff HK. Gr1 DD, nl RV fxn.   CKD (chronic kidney disease), stage II     Diabetes mellitus with complication ([place])    a. Prior admissions w/ DKA (last 12/2017).   Essential hypertension    NICM (nonischemic cardiomyopathy) ([place])    a. 06/2013 Echo: EF<20%; b. 06/2013 Cath: no signif dzs; c. 04/2017 Echo: 20-25%, gr1 DD.; d.05/2018 Echo: 25-30%   Polysubstance abuse ([place])    a. etoh and tobacco   Stroke [facility])    Past Surgical History:  Procedure Laterality Date   NO PAST SURGERIES     Social History:  reports that he has been smoking cigarettes. He has been smoking an average of .5 packs per day. He has never used smokeless tobacco. He reports that he does not currently use alcohol. No history on file for drug use.  No Known Allergies  Family History  Problem Relation Age of Onset   Congestive Heart Failure Mother    Diabetes Mother    Congestive Heart Failure Brother      Prior to Admission medications   Medication Sig Start Date End Date Taking? Authorizing Provider  aspirin 81 MG chewable tablet Chew 1 tablet (81 mg total) by mouth daily. 04/30/17   Demetrios Loll, MD  atorvastatin (LIPITOR) 20 MG tablet Take 20 mg by mouth daily.    [provider]  B-Complex-C TABS Take 1 tablet by mouth daily.    [provider]  Cholecalciferol (VITAMIN D3) 1.25 MG (50000 UT) TABS Take 1 tablet by mouth daily.    [provider]  dapagliflozin propanediol (FARXIGA) 10 MG TABS tablet Take 1  tablet (10 mg total) by mouth daily before breakfast. MUST KEEP APPT FOR FURTHER REFILLS 03/21/20   Alisa Graff, FNP  ENTRESTO 24-26 MG TAKE 1 TABLET BY MOUTH TWICE A DAY 09/14/19   Darylene Price A, FNP  Exenatide ER 2 MG PEN Inject 2 mg into the skin once a week.    [provider]  insulin aspart (NOVOLOG) 100 UNIT/ML injection Inject 5 Units into the skin 3 (three) times daily with meals.    [provider]  insulin glargine (LANTUS) 100 UNIT/ML injection Inject 0.2 mLs (20 Units total) into the skin at bedtime. 03/18/20   Max Sane,  MD  omeprazole (PRILOSEC) 20 MG capsule Take 20 mg by mouth daily. 12/18/18   [provider]  spironolactone (ALDACTONE) 25 MG tablet Take 25 mg by mouth daily. 03/04/20   [provider]  torsemide (DEMADEX) 20 MG tablet Take 20 mg by mouth daily as needed.    [provider]   Physical Exam: Vitals:   01/05/21 1430 01/05/21 1500 01/05/21 1615 01/05/21 1630  BP: (!) 95/59 (!) 98/57 (!) 103/55 (!) 100/57  Pulse: 68 66 67 66  Resp: 16 18 19 18   Temp:    (!) 97.5 F (36.4 C)  TempSrc:    Oral  SpO2: 97% 99% 97% 97%    Wt Readings from Last 3 Encounters:  03/16/20 60.2 kg  08/14/19 68.7 kg  06/28/19 67.3 kg     General:  Appears ill, disoriented Eyes: PERRL, normal lids, irises & conjunctiva ENT: grossly normal hearing, lips & tongue Neck: no masses Cardiovascular: RRR, no m/r/g. No LE edema. Telemetry: SR, no arrhythmias  Respiratory: CTA bilaterally, no w/r/r. Normal respiratory effort. Abdomen: soft, ntnd Skin: no rash or induration seen on limited exam Musculoskeletal: grossly normal tone BUE/BLE Psychiatric: responds to questions mostly by just repeating them back, arousable but quickly goes back to sleep Neurologic: grossly non-focal.          Labs on Admission:  Basic Metabolic Panel: Recent Labs  Lab 01/05/21 1424  NA 131*  K 5.5*  CL 87*  CO2 8*  GLUCOSE 1,016*  BUN 93*  CREATININE 2.43*  CALCIUM 8.5*   Liver Function Tests: Recent Labs  Lab 01/05/21 1424  AST 50*  ALT 33  ALKPHOS 134*  BILITOT 2.9*  PROT 6.4*  ALBUMIN 2.8*   No results for input(s): LIPASE, AMYLASE in the last 168 hours. No results for input(s): AMMONIA in the last 168 hours. CBC: Recent Labs  Lab 01/05/21 1424  WBC 9.0  NEUTROABS 7.6  HGB 11.2*  HCT 36.6*  MCV 101.1*  PLT 298   Cardiac Enzymes: No results for input(s): CKTOTAL, CKMB, CKMBINDEX, TROPONINI in the last 168 hours.  BNP (last 3 results) Recent Labs    03/16/20 1008  BNP  1,104.6*    ProBNP (last 3 results) No results for input(s): PROBNP in the last 8760 hours.  CBG: Recent Labs  Lab 01/05/21 1624 01/05/21 1725  GLUCAP >600* >600*    Radiological Exams on Admission: CT HEAD WO CONTRAST (5MM)  Result Date: 01/05/2021 CLINICAL DATA:  Altered mental status. EXAM: CT HEAD WITHOUT CONTRAST TECHNIQUE: Contiguous axial images were obtained from the base of the skull through the vertex without intravenous contrast. COMPARISON:  March 16, 2020. FINDINGS: Brain: No evidence of acute infarction, hemorrhage, hydrocephalus, extra-axial collection or mass lesion/mass effect. Vascular: No hyperdense vessel or unexpected calcification. Skull: Normal. Negative for fracture or focal lesion. Sinuses/Orbits: Right  ethmoid sinusitis is noted. Other: None. IMPRESSION: No acute intracranial abnormality seen.  Right ethmoid sinusitis. Electronically Signed   By: Marijo Conception M.D.   On: 01/05/2021 15:44   DG Chest Port 1 View  Result Date: 01/05/2021 CLINICAL DATA:  Nausea. EXAM: PORTABLE CHEST 1 VIEW COMPARISON:  March 16, 2020 FINDINGS: The heart size and mediastinal contours are stable. The aorta is tortuous. Mild increased pulmonary interstitium is identified bilaterally. The lung volumes are low. There is no pleural effusion or focal pneumonia. The visualized skeletal structures are unremarkable. IMPRESSION: Mild interstitial edema. Electronically Signed   By: Abelardo Diesel M.D.   On: 01/05/2021 15:06    EKG: Independently reviewed.  Sinus rhythm, prolonged QTC at 538, no acute ischemic changes.  Assessment/Plan Principal Problem:   Hyperosmolar hyperglycemic state (HHS) ([place]) Active Problems:   NICM (nonischemic cardiomyopathy) ([place])   Type 2 diabetes mellitus with stage 3b chronic kidney disease, with long-term current use of insulin ([name])   Smoker   Polysubstance abuse ([name])   Chronic systolic CHF (congestive heart failure) ([place])   Essential hypertension   AKI  (acute kidney injury) ([place])  KA FLAMMER is a 66 y.o. male with hx of poorly controlled type 2 diabetes with multiple episodes of hyperglycemic crises, chronic systolic CHF, polysubstance use disorder, hypertension, who presents via EMS for confusion and found to be in HHS.  #HHS #Anion gap metabolic acidosis #Altered mental status Unclear etiology though suspect driven largely by nonadherence to home medication regimen. - Insulin and LR drips per HHS protocol - Monitor electrolytes closely and correct as needed - Hold home Farxiga, insulin regimen - N.p.o. - Check A1c  #AKI #Hyperkalemia Secondary to HHS, receiving IV fluid resuscitation and trending. - Hold home Entresto, spironolactone, torsemide  #Transaminitis #Hyperbilirubinemia Bilirubin 2.9, AST 50, ALT normal.  Does not have any abnormalities at baseline on review of chart.  Etiology unclear, may be related to significant dehydration, will trend CMP but may need further work-up pending course.  #Chronic medical problems History of CVA-continue aspirin and atorvastatin  CHF-hold spironolactone, torsemide, Entresto in setting of AKI  GERD-continue home PPI  Code Status: Full Code, presumed DVT Prophylaxis: Lovenox Family Communication: None Disposition Plan: Inpatient, Stepdown unit   Time spent: 71 min  Clarnce Flock MD/MPH [facility]  Note:  This document was prepared using Systems analyst and may include unintentional dictation errors.

## 2021-01-06 ENCOUNTER — Encounter: Payer: Self-pay | Admitting: Internal Medicine

## 2021-01-06 ENCOUNTER — Other Ambulatory Visit: Payer: Self-pay

## 2021-01-06 DIAGNOSIS — E1100 Type 2 diabetes mellitus with hyperosmolarity without nonketotic hyperglycemic-hyperosmolar coma (NKHHC): Principal | ICD-10-CM

## 2021-01-06 LAB — CBG MONITORING, ED
Glucose-Capillary: 124 mg/dL — ABNORMAL HIGH (ref 70–99)
Glucose-Capillary: 140 mg/dL — ABNORMAL HIGH (ref 70–99)
Glucose-Capillary: 159 mg/dL — ABNORMAL HIGH (ref 70–99)
Glucose-Capillary: 176 mg/dL — ABNORMAL HIGH (ref 70–99)
Glucose-Capillary: 179 mg/dL — ABNORMAL HIGH (ref 70–99)
Glucose-Capillary: 199 mg/dL — ABNORMAL HIGH (ref 70–99)
Glucose-Capillary: 268 mg/dL — ABNORMAL HIGH (ref 70–99)
Glucose-Capillary: 322 mg/dL — ABNORMAL HIGH (ref 70–99)
Glucose-Capillary: 404 mg/dL — ABNORMAL HIGH (ref 70–99)
Glucose-Capillary: 407 mg/dL — ABNORMAL HIGH (ref 70–99)
Glucose-Capillary: 475 mg/dL — ABNORMAL HIGH (ref 70–99)
Glucose-Capillary: 482 mg/dL — ABNORMAL HIGH (ref 70–99)
Glucose-Capillary: 514 mg/dL (ref 70–99)
Glucose-Capillary: 545 mg/dL (ref 70–99)
Glucose-Capillary: 562 mg/dL (ref 70–99)
Glucose-Capillary: >600 mg/dL (ref 70–99)

## 2021-01-06 LAB — HEMOGLOBIN A1C
Hgb A1c MFr Bld: 14.2 % — ABNORMAL HIGH (ref 4.8–5.6)
Mean Plasma Glucose: 361 mg/dL

## 2021-01-06 LAB — BASIC METABOLIC PANEL
Anion gap: 15 (ref 5–15)
BUN: 94 mg/dL — ABNORMAL HIGH (ref 8–23)
CO2: 28 mmol/L (ref 22–32)
Calcium: 8.9 mg/dL (ref 8.9–10.3)
Chloride: 97 mmol/L — ABNORMAL LOW (ref 98–111)
Creatinine, Ser: 2.26 mg/dL — ABNORMAL HIGH (ref 0.61–1.24)
GFR, Estimated: 32 mL/min — ABNORMAL LOW (ref >60)
Glucose, Bld: 411 mg/dL — ABNORMAL HIGH (ref 70–99)
Potassium: 3.4 mmol/L — ABNORMAL LOW (ref 3.5–5.1)
Sodium: 140 mmol/L (ref 135–145)

## 2021-01-06 LAB — GLUCOSE, CAPILLARY: Glucose-Capillary: 176 mg/dL — ABNORMAL HIGH (ref 70–99)

## 2021-01-06 MED ORDER — INSULIN ASPART 100 UNIT/ML IJ SOLN: 0.0000 [IU] | Freq: Every day | INTRAMUSCULAR | Status: DC

## 2021-01-06 MED ORDER — POTASSIUM CHLORIDE CRYS ER 20 MEQ PO TBCR
20.0000 meq | EXTENDED_RELEASE_TABLET | Freq: Two times a day (BID) | ORAL | Status: AC
  Administered 2021-01-06 – 2021-01-07 (×4): 20 meq via ORAL
  Filled 2021-01-06 (×4): qty 1

## 2021-01-06 MED ORDER — INSULIN DETEMIR 100 UNIT/ML ~~LOC~~ SOLN
15.0000 [IU] | Freq: Two times a day (BID) | SUBCUTANEOUS | Status: DC
  Administered 2021-01-06: 15 [IU] via SUBCUTANEOUS
  Filled 2021-01-06 (×2): qty 0.15

## 2021-01-06 MED ORDER — INSULIN DETEMIR 100 UNIT/ML ~~LOC~~ SOLN
15.0000 [IU] | Freq: Every day | SUBCUTANEOUS | Status: DC
  Administered 2021-01-07 – 2021-01-09 (×3): 15 [IU] via SUBCUTANEOUS
  Filled 2021-01-06 (×4): qty 0.15

## 2021-01-06 MED ORDER — ENOXAPARIN SODIUM 40 MG/0.4ML IJ SOSY
40.0000 mg | PREFILLED_SYRINGE | INTRAMUSCULAR | Status: DC
  Administered 2021-01-06 – 2021-01-08 (×3): 40 mg via SUBCUTANEOUS
  Filled 2021-01-06 (×3): qty 0.4

## 2021-01-06 MED ORDER — INSULIN ASPART 100 UNIT/ML IJ SOLN
3.0000 [IU] | Freq: Three times a day (TID) | INTRAMUSCULAR | Status: DC
  Administered 2021-01-07 – 2021-01-09 (×6): 3 [IU] via SUBCUTANEOUS
  Filled 2021-01-06 (×7): qty 1

## 2021-01-06 MED ORDER — LACTATED RINGERS IV SOLN: INTRAVENOUS | Status: DC

## 2021-01-06 MED ORDER — INSULIN ASPART 100 UNIT/ML IJ SOLN
0.0000 [IU] | Freq: Three times a day (TID) | INTRAMUSCULAR | Status: DC
  Administered 2021-01-07: 3 [IU] via SUBCUTANEOUS
  Administered 2021-01-07 – 2021-01-08 (×2): 2 [IU] via SUBCUTANEOUS
  Administered 2021-01-08: 5 [IU] via SUBCUTANEOUS
  Administered 2021-01-08: 8 [IU] via SUBCUTANEOUS
  Administered 2021-01-09: 5 [IU] via SUBCUTANEOUS
  Administered 2021-01-09: 3 [IU] via SUBCUTANEOUS
  Filled 2021-01-06 (×7): qty 1

## 2021-01-06 NOTE — ED Notes (Signed)
Pt provided water for PO challenge. Pt able to drink without N/v provider aware.

## 2021-01-06 NOTE — ED Notes (Signed)
Pt had multiple episodes of diarrhea, brief and chuck changes. Pt coughing continues to denies N/V Did not eat dinner states that he cant eat the hospital food and he has no appetite

## 2021-01-06 NOTE — Plan of Care (Signed)

## 2021-01-06 NOTE — Progress Notes (Signed)
PROGRESS NOTE    Shawn [name]  HBZ:[id number] DOB: [birth year] DOA: 01/05/2021 PCP: [facility]    Brief Narrative:  66 year old with history of poorly controlled type 2 diabetes with multiple episodes of hyperglycemic crisis, chronic systolic heart failure, polysubstance use disorder, hypertension presented to ER with confusion.  Sister called EMS because he was in the bed for 4 days without really getting up and not eating or drinking or taking any of his medications.  In the emergency room, hemodynamically stable.  CMP with blood glucose of 1016, creatinine 2.4, CO2 8 with anion gap of 36.  Fluid resuscitation is started.  Treated with IV insulin and admitted to the hospital.   Assessment & Plan:   Principal Problem:   Hyperosmolar hyperglycemic state (HHS) ([place]) Active Problems:   NICM (nonischemic cardiomyopathy) ([place])   Type 2 diabetes mellitus with stage 3b chronic kidney disease, with long-term current use of insulin ([name])   Smoker   Polysubstance abuse ([name])   Chronic systolic CHF (congestive heart failure) ([name])   Essential hypertension   AKI (acute kidney injury) ([place])  Hyperosmolar hyperglycemic state in a patient with poorly controlled type 2 diabetes Noncompliance to medications, recent known A1c of 12.6, repeat pending. Patient treated with IV fluids and IV insulin with adequate stabilization.  Blood sugars are less than 200.  Anion gap closed.  Patient able to eat soft diet. -Start subcu insulin, Levemir 15 units twice a day, uses 20 units at home.  Keep on low-dose mealtime insulin.  Keep on sliding scale insulin.  Will uptitrate as needed.  Repeat A1c pending. Continue counseling and monitoring.  Patient still not very keen to conversation.  We will follow-up.  Hypokalemia: Replace and monitor levels.  Acute kidney injury with history of chronic kidney disease stage IIIb: Historically reported creatinine 1.4-2.2.  Treated with IV fluids.  At about  baseline.  Recheck tomorrow morning.  Acute metabolic encephalopathy: Due to #1.  Improving.  Chronic systolic heart failure: Ejection fraction 20% 2 years ago. Currently euvolemic.  Holding Entresto, Aldactone and torsemide.   DVT prophylaxis: enoxaparin (LOVENOX) injection 30 mg Start: 01/05/21 2200   Code Status: Full code Family Communication: None at the bedside Disposition Plan: Status is: Inpatient  Remains inpatient appropriate because: IV insulin.         Consultants:  None  Procedures:  None  Antimicrobials:  None   Subjective: Patient seen and examined in the emergency room.  He will answer basic questions and closes eyes.  Denies any complaints.  Stated he is hungry and denied any nausea vomiting.  He was not very forthcoming with interview.  Objective: Vitals:   01/06/21 0800 01/06/21 1000 01/06/21 1100 01/06/21 1130  BP: 99/63 104/68 139/79 122/73  Pulse: 60 (!) 57 60 (!) 59  Resp: 17 14 17    Temp:      TempSrc:      SpO2: 91% 96% 97% 94%  Weight:      Height:        Intake/Output Summary (Last 24 hours) at 01/06/2021 1336 Last data filed at 01/05/2021 1638 Gross per 24 hour  Intake 1000 ml  Output --  Net 1000 ml   Filed Weights   01/05/21 2100  Weight: 70 kg    Examination:  General: Looks fairly comfortable at rest.  On room air. Flat affect.  Not keen to conversation. Cardiovascular: S1-S2 normal.  Regular rate rhythm. Respiratory: Bilateral clear.  No added sounds. Gastrointestinal: Soft.  Nontender.  Bowel sound present. Ext: No edema or swelling.  No cyanosis.    Data Reviewed: I have personally reviewed following labs and imaging studies  CBC: Recent Labs  Lab 01/05/21 1424  WBC 9.0  NEUTROABS 7.6  HGB 11.2*  HCT 36.6*  MCV 101.1*  PLT 956   Basic Metabolic Panel: Recent Labs  Lab 01/05/21 1424 01/05/21 1938 01/06/21 0448  NA 131* 133* 140  K 5.5* 4.4 3.4*  CL 87* 90* 97*  CO2 8* 13* 28  GLUCOSE 1,016*  885* 411*  BUN 93* 95* 94*  CREATININE 2.43* 2.55* 2.26*  CALCIUM 8.5* 8.5* 8.9   GFR: Estimated Creatinine Clearance: 30.9 mL/min (A) (by C-G formula based on SCr of 2.26 mg/dL (H)). Liver Function Tests: Recent Labs  Lab 01/05/21 1424  AST 50*  ALT 33  ALKPHOS 134*  BILITOT 2.9*  PROT 6.4*  ALBUMIN 2.8*   No results for input(s): LIPASE, AMYLASE in the last 168 hours. No results for input(s): AMMONIA in the last 168 hours. Coagulation Profile: No results for input(s): INR, PROTIME in the last 168 hours. Cardiac Enzymes: No results for input(s): CKTOTAL, CKMB, CKMBINDEX, TROPONINI in the last 168 hours. BNP (last 3 results) No results for input(s): PROBNP in the last 8760 hours. HbA1C: No results for input(s): HGBA1C in the last 72 hours. CBG: Recent Labs  Lab 01/06/21 0728 01/06/21 0841 01/06/21 0931 01/06/21 1105 01/06/21 1155  GLUCAP 268* 199* 179* 159* 176*   Lipid Profile: No results for input(s): CHOL, HDL, LDLCALC, TRIG, CHOLHDL, LDLDIRECT in the last 72 hours. Thyroid Function Tests: No results for input(s): TSH, T4TOTAL, FREET4, T3FREE, THYROIDAB in the last 72 hours. Anemia Panel: No results for input(s): VITAMINB12, FOLATE, FERRITIN, TIBC, IRON, RETICCTPCT in the last 72 hours. Sepsis Labs: Recent Labs  Lab 01/05/21 1424 01/05/21 1938  LATICACIDVEN 1.8 1.7    Recent Results (from the past 240 hour(s))  Blood culture (single)     Status: None (Preliminary result)   Collection Time: 01/05/21  2:25 PM   Specimen: BLOOD  Result Value Ref Range Status   Specimen Description BLOOD RIGHT FA  Final   Special Requests   Final    BOTTLES DRAWN AEROBIC AND ANAEROBIC Blood Culture results may not be optimal due to an excessive volume of blood received in culture bottles   Culture   Final    NO GROWTH < 24 HOURS Performed at [facility], [address], [place], [place] [address]    Report Status PENDING  Incomplete  Resp Panel by RT-PCR (Flu  A&B, Covid) Nasopharyngeal Swab     Status: None   Collection Time: 01/05/21  2:50 PM   Specimen: Nasopharyngeal Swab; Nasopharyngeal(NP) swabs in vial transport medium  Result Value Ref Range Status   SARS Coronavirus 2 by RT PCR NEGATIVE NEGATIVE Final    Comment: (NOTE) SARS-CoV-2 target nucleic acids are NOT DETECTED.  The SARS-CoV-2 RNA is generally detectable in upper respiratory specimens during the acute phase of infection. The lowest concentration of SARS-CoV-2 viral copies this assay can detect is 138 copies/mL. A negative result does not preclude SARS-Cov-2 infection and should not be used as the sole basis for treatment or other patient management decisions. A negative result may occur with  improper specimen collection/handling, submission of specimen other than nasopharyngeal swab, presence of viral mutation(s) within the areas targeted by this assay, and inadequate number of viral copies(<138 copies/mL). A negative result must be combined with clinical observations,  patient history, and epidemiological information. The expected result is Negative.  Fact Sheet for Patients:  EntrepreneurPulse.com.au  Fact Sheet for Healthcare Providers:  IncredibleEmployment.be  This test is no t yet approved or cleared by the [place] FDA and  has been authorized for detection and/or diagnosis of SARS-CoV-2 by FDA under an Emergency Use Authorization (EUA). This EUA will remain  in effect (meaning this test can be used) for the duration of the COVID-19 declaration under Section 564(b)(1) of the Act, 21 U.S.C.section 360bbb-3(b)(1), unless the authorization is terminated  or revoked sooner.       Influenza A by PCR NEGATIVE NEGATIVE Final   Influenza B by PCR NEGATIVE NEGATIVE Final    Comment: (NOTE) The Xpert Xpress SARS-CoV-2/FLU/RSV plus assay is intended as an aid in the diagnosis of influenza from Nasopharyngeal swab specimens  and should not be used as a sole basis for treatment. Nasal washings and aspirates are unacceptable for Xpert Xpress SARS-CoV-2/FLU/RSV testing.  Fact Sheet for Patients: EntrepreneurPulse.com.au  Fact Sheet for Healthcare Providers: IncredibleEmployment.be  This test is not yet approved or cleared by the [place] FDA and has been authorized for detection and/or diagnosis of SARS-CoV-2 by FDA under an Emergency Use Authorization (EUA). This EUA will remain in effect (meaning this test can be used) for the duration of the COVID-19 declaration under Section 564(b)(1) of the Act, 21 U.S.C. section 360bbb-3(b)(1), unless the authorization is terminated or revoked.  Performed at [facility], [address], [place], [place] [address]          Radiology Studies: CT HEAD WO CONTRAST (5MM)  Result Date: 01/05/2021 CLINICAL DATA:  Altered mental status. EXAM: CT HEAD WITHOUT CONTRAST TECHNIQUE: Contiguous axial images were obtained from the base of the skull through the vertex without intravenous contrast. COMPARISON:  March 16, 2020. FINDINGS: Brain: No evidence of acute infarction, hemorrhage, hydrocephalus, extra-axial collection or mass lesion/mass effect. Vascular: No hyperdense vessel or unexpected calcification. Skull: Normal. Negative for fracture or focal lesion. Sinuses/Orbits: Right ethmoid sinusitis is noted. Other: None. IMPRESSION: No acute intracranial abnormality seen.  Right ethmoid sinusitis. Electronically Signed   By: Marijo Conception M.D.   On: 01/05/2021 15:44   DG Chest Port 1 View  Result Date: 01/05/2021 CLINICAL DATA:  Nausea. EXAM: PORTABLE CHEST 1 VIEW COMPARISON:  March 16, 2020 FINDINGS: The heart size and mediastinal contours are stable. The aorta is tortuous. Mild increased pulmonary interstitium is identified bilaterally. The lung volumes are low. There is no pleural effusion or focal pneumonia. The visualized  skeletal structures are unremarkable. IMPRESSION: Mild interstitial edema. Electronically Signed   By: Abelardo Diesel M.D.   On: 01/05/2021 15:06        Scheduled Meds:  aspirin  81 mg Oral Daily   atorvastatin  20 mg Oral Daily   enoxaparin (LOVENOX) injection  30 mg Subcutaneous Q24H   insulin aspart  0-15 Units Subcutaneous TID WC   insulin aspart  0-5 Units Subcutaneous QHS   insulin aspart  3 Units Subcutaneous TID WC   insulin detemir  15 Units Subcutaneous BID   pantoprazole (PROTONIX) IV  40 mg Intravenous Q24H   potassium chloride  20 mEq Oral BID   Continuous Infusions:  dextrose 5% lactated ringers 125 mL/hr at 01/06/21 0849   lactated ringers Stopped (01/06/21 0849)     LOS: 1 day    Time spent: 35 minutes    Barb Merino, MD [facility] Pager [phone]

## 2021-01-06 NOTE — Progress Notes (Signed)
Inpatient Diabetes Program Recommendations  AACE/ADA: New Consensus Statement on Inpatient Glycemic Control (2015)  Target Ranges:  Prepandial:   less than 140 mg/dL      Peak postprandial:   less than 180 mg/dL (1-2 hours)      Critically ill patients:  140 - 180 mg/dL   Lab Results  Component Value Date   GLUCAP 268 (H) 01/06/2021   HGBA1C 12.6 (H) 03/17/2020    Review of Glycemic Control  Home DM Meds: Lantus 15-20 units QHS (Not taking)                             Novolog 5 units TID                             Faxiga 10 mg Daily (not taking)                   Current orders for Inpatient glycemic control: IV insulin  Inpatient Diabetes Program Recommendations:   When transitioning to SQ insulin, overlap insulin drip after basal given x 2 hrs. And cover CBG with Novolog correction when IV insulin discontinued. -Semglee 15 units -Novolog 0-9 units tid + hs 0-5 units if eating If not eating 0-9 units q 4 hrs.  Noted patient per chart has not taken his medications for approx 4 days.  Thank you, Nani Gasser. Yittel Emrich, RN, MSN, CDE  Diabetes Coordinator Inpatient Glycemic Control Team Team Pager [phone] (8am-5pm) 01/06/2021 8:32 AM

## 2021-01-07 DIAGNOSIS — R739 Hyperglycemia, unspecified: Secondary | ICD-10-CM

## 2021-01-07 DIAGNOSIS — F1910 Other psychoactive substance abuse, uncomplicated: Secondary | ICD-10-CM

## 2021-01-07 DIAGNOSIS — N179 Acute kidney failure, unspecified: Secondary | ICD-10-CM

## 2021-01-07 LAB — BASIC METABOLIC PANEL
Anion gap: 10 (ref 5–15)
BUN: 81 mg/dL — ABNORMAL HIGH (ref 8–23)
CO2: 31 mmol/L (ref 22–32)
Calcium: 9 mg/dL (ref 8.9–10.3)
Chloride: 96 mmol/L — ABNORMAL LOW (ref 98–111)
Creatinine, Ser: 1.57 mg/dL — ABNORMAL HIGH (ref 0.61–1.24)
GFR, Estimated: 49 mL/min — ABNORMAL LOW (ref >60)
Glucose, Bld: 82 mg/dL (ref 70–99)
Potassium: 4 mmol/L (ref 3.5–5.1)
Sodium: 137 mmol/L (ref 135–145)

## 2021-01-07 LAB — CBC WITH DIFFERENTIAL/PLATELET
Abs Immature Granulocytes: 0.01 10*3/uL (ref 0.00–0.07)
Basophils Absolute: 0 10*3/uL (ref 0.0–0.1)
Basophils Relative: 0 %
Eosinophils Absolute: 0 10*3/uL (ref 0.0–0.5)
Eosinophils Relative: 1 %
HCT: 36.3 % — ABNORMAL LOW (ref 39.0–52.0)
Hemoglobin: 12.4 g/dL — ABNORMAL LOW (ref 13.0–17.0)
Immature Granulocytes: 0 %
Lymphocytes Relative: 12 %
Lymphs Abs: 0.7 10*3/uL (ref 0.7–4.0)
MCH: 30.5 pg (ref 26.0–34.0)
MCHC: 34.2 g/dL (ref 30.0–36.0)
MCV: 89.2 fL (ref 80.0–100.0)
Monocytes Absolute: 0.4 10*3/uL (ref 0.1–1.0)
Monocytes Relative: 7 %
Neutro Abs: 4.3 10*3/uL (ref 1.7–7.7)
Neutrophils Relative %: 80 %
Platelets: 274 10*3/uL (ref 150–400)
RBC: 4.07 MIL/uL — ABNORMAL LOW (ref 4.22–5.81)
RDW: 14.6 % (ref 11.5–15.5)
WBC: 5.4 10*3/uL (ref 4.0–10.5)
nRBC: 0 % (ref 0.0–0.2)

## 2021-01-07 LAB — URINE CULTURE

## 2021-01-07 LAB — PHOSPHORUS: Phosphorus: 2.6 mg/dL (ref 2.5–4.6)

## 2021-01-07 LAB — GLUCOSE, CAPILLARY
Glucose-Capillary: 163 mg/dL — ABNORMAL HIGH (ref 70–99)
Glucose-Capillary: 143 mg/dL — ABNORMAL HIGH (ref 70–99)
Glucose-Capillary: 116 mg/dL — ABNORMAL HIGH (ref 70–99)
Glucose-Capillary: 89 mg/dL (ref 70–99)

## 2021-01-07 LAB — MAGNESIUM: Magnesium: 3.1 mg/dL — ABNORMAL HIGH (ref 1.7–2.4)

## 2021-01-07 MED ORDER — GUAIFENESIN-DM 100-10 MG/5ML PO SYRP
5.0000 mL | ORAL_SOLUTION | ORAL | Status: DC | PRN
  Administered 2021-01-07 – 2021-01-09 (×3): 5 mL via ORAL
  Filled 2021-01-07 (×3): qty 5

## 2021-01-07 NOTE — Evaluation (Addendum)
Occupational Therapy Evaluation Patient Details Name: Shawn [name] MRN: [id number] DOB: 1955/11/30 Today's Date: 01/07/2021   History of Present Illness Per MD Notes: Shawn [name] is a 66 y.o. male with hx of poorly controlled type 2 diabetes with multiple episodes of hyperglycemic crises, chronic systolic CHF, polysubstance use disorder, hypertension, who presents via EMS with confusion. In the ED initial vital signs notable for temperature of 94 F, blood pressures of 90s over 50s. CMP with blood glucose of 1016, creatinine 2.4, CO2 8 with anion gap of 36.  Fluid resuscitation is started.  Treated with IV insulin and admitted for further management.   Clinical Impression   [name] was seen for OT evaluation this date. Prior to hospital admission, pt was independent with ADL management. Pt lives alone in a 1 level home. He provides limited information regarding home set-up/PLOF 2/2 lethargy during session. Currently pt demonstrates impairments as described below (See OT problem list) which functionally limit his ability to perform ADL/self-care tasks. Pt currently requires MOD A for bed mobility. Functional mobility deferred 2/2 pt lethargy during session. He requires SET-UP of meal tray items and is able to self-feed with cueing for attending to breakfast tray items. Anticipate MOD-MAX A for LB ADL management.  Pt would benefit from skilled OT services to address noted impairments and functional limitations (see below for any additional details) in order to maximize safety and independence while minimizing falls risk and caregiver burden. Upon hospital discharge, recommend STR to maximize pt safety and return to PLOF.        Recommendations for follow up therapy are one component of a multi-disciplinary discharge planning process, led by the attending physician.  Recommendations may be updated based on patient status, additional functional criteria and insurance authorization.    Follow Up Recommendations  Skilled nursing-short term rehab (<3 hours/day)    Assistance Recommended at Discharge Intermittent Supervision/Assistance  Patient can return home with the following Assistance with feeding;Help with stairs or ramp for entrance;Assist for transportation;Assistance with cooking/housework;Two people to help with walking and/or transfers;A lot of help with walking and/or transfers;A lot of help with bathing/dressing/bathroom;Direct supervision/assist for medications management    Functional Status Assessment  Patient has had a recent decline in their functional status and demonstrates the ability to make significant improvements in function in a reasonable and predictable amount of time.  Equipment Recommendations  BSC/3in1    Recommendations for Other Services       Precautions / Restrictions Precautions Precautions: Fall Restrictions Weight Bearing Restrictions: No      Mobility Bed Mobility Overal bed mobility: Needs Assistance Bed Mobility: Supine to Sit;Sit to Supine     Supine to sit: Mod assist Sit to supine: Mod assist        Transfers                   General transfer comment: Deferred. Pt requests return to bed as soon as he is seated upright.      Balance Overall balance assessment: Needs assistance Sitting-balance support: Feet supported;Bilateral upper extremity supported Sitting balance-Leahy Scale: Poor Sitting balance - Comments: Unable to maintain static sitting balance at EOB without support.                                   ADL either performed or assessed with clinical judgement   ADL Overall ADL's : Needs assistance/impaired  General ADL Comments: Pt functionally limited by generalized weakness, decreased activity tolerance, and increased lethargy t/o session. He requires MOD A to come to sitting at EOB. SET UP of meal tray items for  bed-level self-feeding. Anticipate MOD-MAX A for LB ADL management.     Vision Patient Visual Report: No change from baseline       Perception     Praxis      Pertinent Vitals/Pain Pain Assessment: No/denies pain     Hand Dominance Right   Extremity/Trunk Assessment Upper Extremity Assessment Upper Extremity Assessment: Generalized weakness   Lower Extremity Assessment Lower Extremity Assessment: Generalized weakness   Cervical / Trunk Assessment Cervical / Trunk Assessment: Normal   Communication Communication Communication: No difficulties   Cognition Arousal/Alertness: Lethargic Behavior During Therapy: Flat affect Overall Cognitive Status: No family/caregiver present to determine baseline cognitive functioning                                 General Comments: Pt is A&O x4, but has difficulty following single-step VCs. He remains lethargic t/o session. Requires increased multimodal prompting to complete functional tasks. Tends to trail off mid sentence. Is easily redirectable to task/topic at hand.     General Comments       Exercises Other Exercises Other Exercises: Pt educated on role of OT in acute setting, safe use of AE/DME for ADL management, and routines modifications to support safety and functional indep. upon hospital DC. OT facilitates UB dressing, set up of meal tray, bed mobility with education on safety and compensatory strategies provided t/o session.   Shoulder Instructions      Home Living Family/patient expects to be discharged to:: Private residence   Available Help at Discharge: Family;Available 24 hours/day Type of Home: House Home Access: Stairs to enter [employer] of Steps: 2 Entrance Stairs-Rails: None Home Layout: One level     Bathroom Shower/Tub: Teacher, early years/pre: Handicapped height     Home Equipment: Cane - single point;Rollator (4 wheels);Grab bars - tub/shower           Prior Functioning/Environment Prior Level of Function : Independent/Modified Independent             Mobility Comments: Pt reports using 4WW for functional mobility. Denies falls. ADLs Comments: Pt endorses being independent with ADL management. Performs bathing, dressing, independently.        OT Problem List: Decreased strength;Decreased coordination;Decreased range of motion;Decreased cognition;Decreased activity tolerance;Decreased safety awareness;Impaired balance (sitting and/or standing);Decreased knowledge of use of DME or AE      OT Treatment/Interventions: Self-care/ADL training;Therapeutic exercise;Therapeutic activities;DME and/or AE instruction;Patient/family education;Balance training;Energy conservation    OT Goals(Current goals can be found in the care plan section) Acute Rehab OT Goals Patient Stated Goal: To go back to sleep. OT Goal Formulation: With patient Time For Goal Achievement: 01/21/21 Potential to Achieve Goals: Good  OT Frequency: Min 2X/week    Co-evaluation              AM-PAC OT "6 Clicks" Daily Activity     Outcome Measure Help from another person eating meals?: A Little Help from another person taking care of personal grooming?: A Little Help from another person toileting, which includes using toliet, bedpan, or urinal?: A Lot Help from another person bathing (including washing, rinsing, drying)?: A Lot Help from another person to put on and taking off regular upper body clothing?: A Little  Help from another person to put on and taking off regular lower body clothing?: A Lot 6 Click Score: 15   End of Session Equipment Utilized During Treatment: Gait belt;Rolling walker (2 wheels)  Activity Tolerance: Patient limited by lethargy Patient left: in bed;with call bell/phone within reach;with bed alarm set  OT Visit Diagnosis: Muscle weakness (generalized) (M62.81)                Time: 3582-5189 OT Time Calculation (min): 32  min Charges:  OT General Charges $OT Visit: 1 Visit OT Evaluation $OT Eval Moderate Complexity: 1 Mod OT Treatments $Self Care/Home Management : 23-37 mins  Shara Blazing, M.S., OTR/L Feeding Team - [facility] Ascom: [phone] 01/07/21, 11:16 AM

## 2021-01-07 NOTE — Consult Note (Addendum)
Consultation Note Date: 01/07/2021   Patient Name: Shawn [name]  DOB: 04/05/1955  MRN: [id number]  Age / Sex: 66 y.o., male  PCP: [facility] Referring Physician: Barb Merino, MD  Reason for Consultation: Establishing goals of care  HPI/Patient Profile: 66 y.o. male  with past medical history of DM2 with multiple episodes of hyperglycemic crisis, chronic systolic HF, polysubstance use disorder, HTN, and current smoker admitted on 01/05/2021 with confusion and lethargy x4 days.   Patient is being treated for hyperosmolar hyperglycemic state, hypokalemia, acute kidney injury, acute metabolic encephalopathy, and chronic systolic heart failure.  Clinical Assessment and Goals of Care: I have reviewed medical records including EPIC notes, labs and imaging, assessed the patient and then met with patient at bedside to discuss diagnosis prognosis, GOC, EOL wishes, disposition and options.  I introduced Palliative Medicine as specialized medical care for people living with serious illness. It focuses on providing relief from the symptoms and stress of a serious illness. The goal is to improve quality of life for both the patient and the family.  I attempted to discuss a brief life review of the patient.  Patient shared he is unable to get any rest to the hospital would just like to sleep.  Patient denies pain, anxiety, agitation, or other symptoms at this time.  I was unable to speak with him regarding his functional and nutritional status prior to admission since the patient continued to repeat that he just wanted to be left alone.  I attempted to elicit values and goals of care important to the patient.  He said he just wanted to sleep and to be left alone.  I asked if I could speak with his sister regarding his care he nodded yes before turning his head away and appearing to get back to  sleep.  I attempted to speak with his sister via telephone.  No answer.  HIPAA appropriate voicemail left.  I attempted to speak with his brother over the telephone.  No answer.  HIPAA appropriate voicemail left.  Palliative medicine team will continue to follow the patient throughout his hospitalization.  I will await phone call returned from family members and round on the patient again tomorrow.  Primary Decision Maker PATIENT  Code Status/Advance Care Planning: Full code  Prognosis:   Unable to determine  Discharge Planning: To Be Determined  Primary Diagnoses: Present on Admission:  Smoker  Polysubstance abuse ([place])  Chronic systolic CHF (congestive heart failure) ([name])  Essential hypertension  AKI (acute kidney injury) ([place])  Hyperosmolar hyperglycemic state (HHS) ([place])   Physical Exam Vitals and nursing note reviewed.  Constitutional:      General: He is not in acute distress.    Appearance: He is not ill-appearing.  HENT:     Head: Normocephalic and atraumatic.     Mouth/Throat:     Mouth: Mucous membranes are dry.  Cardiovascular:     Rate and Rhythm: Normal rate.     Pulses: Normal pulses.  Pulmonary:  Effort: Pulmonary effort is normal.  Abdominal:     Palpations: Abdomen is soft.  Musculoskeletal:        General: Normal range of motion.     Cervical back: Normal range of motion.  Skin:    General: Skin is warm and dry.  Neurological:     Mental Status: He is alert. Mental status is at baseline.  Psychiatric:        Mood and Affect: Mood normal.        Thought Content: Thought content normal.        Judgment: Judgment normal.    Vital Signs: BP 94/64 (BP Location: Left Arm)    Pulse 73    Temp 98.5 F (36.9 C)    Resp 17    Ht 5' 7"  (1.702 m)    Wt 70 kg    SpO2 92%    BMI 24.17 kg/m  Pain Scale: 0-10 POSS *See Group Information*: 1-Acceptable,Awake and alert Pain Score: 0-No pain SpO2: SpO2: 92 % O2 Device:SpO2: 92 % O2 Flow Rate: .    Palliative Assessment/Data: 50%     I discussed this patient's plan of care with patient.  Thank you for this consult. Palliative medicine will continue to follow and assist holistically.   Time Total: 30 minutes Greater than 50%  of this time was spent counseling and coordinating care related to the above assessment and plan.  Signed by: Jordan Hawks, DNP, FNP-BC Palliative Medicine    Please contact Palliative Medicine Team phone at [phone] for questions and concerns.  For individual provider: See Shea Evans

## 2021-01-07 NOTE — Progress Notes (Signed)
Inpatient Diabetes Program Recommendations  AACE/ADA: New Consensus Statement on Inpatient Glycemic Control (2015)  Target Ranges:  Prepandial:   less than 140 mg/dL      Peak postprandial:   less than 180 mg/dL (1-2 hours)      Critically ill patients:  140 - 180 mg/dL   Lab Results  Component Value Date   GLUCAP 163 (H) 01/07/2021   HGBA1C 14.2 (H) 01/05/2021    Review of Glycemic Control  Inpatient Diabetes Program Recommendations:   Spoke with patient regarding diabetes management. Patient is well known to our team. Discussed importance of taking prescribed insulin, risks of not administering prescribed insulin, checking CBGs and hypoglycemia protocol. Patient acknowledged understanding and states plans to get support for his drinking problem and plans to take his medication as prescribed. Patient "lost" his glucometer and currently uses his sister's glucose meter. Needs on discharge: -CBG meter & supplies [phone] -Lantus solostar pen [phone] -Novolog flexpen [id number]  Thank you, Nani Gasser. Montserrath Madding, RN, MSN, CDE  Diabetes Coordinator Inpatient Glycemic Control Team Team Pager [phone] (8am-5pm) 01/07/2021 10:24 AM

## 2021-01-07 NOTE — Progress Notes (Signed)
Physical Therapy Evaluation Patient Details Name: Shawn [name] MRN: [id number] DOB: 14-Feb-1955 Today's Date: 01/07/2021  History of Present Illness  Per MD Notes: Pt is a 66 y.o. male with hx of poorly controlled type 2 diabetes with multiple episodes of hyperglycemic crises, chronic systolic CHF, polysubstance use disorder, hypertension, who presents via EMS with confusion. Pt noted to have blood glucose of 1016. Fluid resuscitation is started.  Treated with IV insulin and admitted for further management.  MD assessment includes: Hyperosmolar hyperglycemic state, hypokalemia, AKI, and acute metabolic encephalopathy.   Clinical Impression  Pt somewhat lethargic but with extra time and encouragement put forth fair effort during the session.  Pt required physical assistance with both bed mobility tasks as well as with sit to/from stand transfers and while in standing quickly buckled requiring heavy assist for safe return to sitting at the EOB.  Pt's SpO2, HR, and BP were all WNL during the session with no adverse symptoms reported by the pt.  Pt is at a very high risk for falls and would not be safe to return to his prior living situation at this time. Pt will benefit from PT services in a SNF setting upon discharge to safely address deficits listed in patient problem list for decreased caregiver assistance and eventual return to PLOF.      Recommendations for follow up therapy are one component of a multi-disciplinary discharge planning process, led by the attending physician.  Recommendations may be updated based on patient status, additional functional criteria and insurance authorization.  Follow Up Recommendations Skilled nursing-short term rehab (<3 hours/day)    Assistance Recommended at Discharge Frequent or constant Supervision/Assistance  Patient can return home with the following  Two people to help with walking and/or transfers;Two people to help with  bathing/dressing/bathroom;Direct supervision/assist for medications management;Help with stairs or ramp for entrance;Assist for transportation;Direct supervision/assist for financial management;Assistance with cooking/housework    Equipment Recommendations Other (comment) (TBD at next venue of care)  Recommendations for Other Services       Functional Status Assessment Patient has had a recent decline in their functional status and demonstrates the ability to make significant improvements in function in a reasonable and predictable amount of time.     Precautions / Restrictions Precautions Precautions: Fall Restrictions Weight Bearing Restrictions: No Other Position/Activity Restrictions: Buckles in standing      Mobility  Bed Mobility Overal bed mobility: Needs Assistance Bed Mobility: Supine to Sit;Sit to Supine     Supine to sit: Mod assist Sit to supine: Mod assist   General bed mobility comments: Mod A for BLE and trunk management    Transfers Overall transfer level: Needs assistance Equipment used: Rolling walker (2 wheels) Transfers: Sit to/from Stand Sit to Stand: Mod assist;From elevated surface           General transfer comment: Mod verbal cues for hand placement and to initiate transfer attempt    Ambulation/Gait               General Gait Details: Unable: pt able to stand with assist but quickly buckled while in standing requiring heavy assist to return to EOB safely  Stairs            Wheelchair Mobility    Modified Rankin (Stroke Patients Only)       Balance Overall balance assessment: Needs assistance Sitting-balance support: Feet supported;Bilateral upper extremity supported Sitting balance-Leahy Scale: Poor Sitting balance - Comments: Unable to maintain static sitting balance at EOB without  support.   Standing balance support: Bilateral upper extremity supported;During functional activity Standing balance-Leahy Scale: Poor                                Pertinent Vitals/Pain Pain Assessment: No/denies pain    Home Living Family/patient expects to be discharged to:: Private residence Living Arrangements: Alone Available Help at Discharge: Family;Available PRN/intermittently Type of Home: House Home Access: Stairs to enter Entrance Stairs-Rails: None Entrance Stairs-Number of Steps: 2   Home Layout: One level Home Equipment: Cane - single point;Rollator (4 wheels);Grab bars - tub/shower Additional Comments: Pt stated sister is available to assist intermittently; Pt confused and difficult to engage with difficulty providing history.  Above history as well as PLOF taken from a combination of chart review and patient    Prior Function Prior Level of Function : Independent/Modified Independent             Mobility Comments: Pt reports using 4WW for functional mobility. Denies falls. ADLs Comments: Pt endorses being independent with ADL management. Performs bathing, dressing, independently.     Hand Dominance        Extremity/Trunk Assessment   Upper Extremity Assessment Upper Extremity Assessment: Generalized weakness    Lower Extremity Assessment Lower Extremity Assessment: Generalized weakness       Communication   Communication: Other (comment) (Pt with limited expressive communication, mostly yes/no answers to questions)  Cognition Arousal/Alertness: Lethargic Behavior During Therapy: Flat affect Overall Cognitive Status: No family/caregiver present to determine baseline cognitive functioning                                          General Comments      Exercises Other Exercises Other Exercises: Bed mobility training with multiple rolling L/R and sup to/from sit Other Exercises: Static sitting balance and core strengthening at EOB   Assessment/Plan    PT Assessment Patient needs continued PT services  PT Problem List Decreased strength;Decreased  activity tolerance;Decreased balance;Decreased mobility;Decreased cognition;Decreased knowledge of use of DME       PT Treatment Interventions DME instruction;Gait training;Stair training;Functional mobility training;Therapeutic activities;Therapeutic exercise;Balance training;Patient/family education    PT Goals (Current goals can be found in the Care Plan section)  Acute Rehab PT Goals PT Goal Formulation: Patient unable to participate in goal setting Time For Goal Achievement: 01/20/21 Potential to Achieve Goals: Fair    Frequency Min 2X/week     Co-evaluation               AM-PAC PT "6 Clicks" Mobility  Outcome Measure Help needed turning from your back to your side while in a flat bed without using bedrails?: A Lot Help needed moving from lying on your back to sitting on the side of a flat bed without using bedrails?: A Lot Help needed moving to and from a bed to a chair (including a wheelchair)?: Total Help needed standing up from a chair using your arms (e.g., wheelchair or bedside chair)?: A Lot Help needed to walk in hospital room?: Total Help needed climbing 3-5 steps with a railing? : Total 6 Click Score: 9    End of Session Equipment Utilized During Treatment: Gait belt Activity Tolerance: Patient tolerated treatment well Patient left: in bed;with call bell/phone within reach;with bed alarm set Nurse Communication: Mobility status PT Visit Diagnosis: Unsteadiness on  feet (R26.81);Difficulty in walking, not elsewhere classified (R26.2);Muscle weakness (generalized) (M62.81)    Time: 4944-9675 PT Time Calculation (min) (ACUTE ONLY): 25 min   Charges:   PT Evaluation $PT Eval Moderate Complexity: 1 Mod PT Treatments $Therapeutic Activity: 8-22 mins       D. Royetta Asal PT, DPT 01/07/21, 3:59 PM

## 2021-01-07 NOTE — Progress Notes (Signed)
PROGRESS NOTE    Shawn [name]  CNO:[id number] DOB: Mar 06, 1955 DOA: 01/05/2021 PCP: [facility]    Brief Narrative:  66 year old with history of poorly controlled type 2 diabetes with multiple episodes of hyperglycemic crisis, chronic systolic heart failure, polysubstance use disorder, hypertension presented to ER with confusion.  Sister called EMS because he was in the bed for 4 days without really getting up and not eating or drinking or taking any of his medications.  In the emergency room, hemodynamically stable.  CMP with blood glucose of 1016, creatinine 2.4, CO2 8 with anion gap of 36.  Fluid resuscitation is started.  Treated with IV insulin and admitted to the hospital.   Assessment & Plan:   Principal Problem:   Hyperosmolar hyperglycemic state (HHS) ([place]) Active Problems:   NICM (nonischemic cardiomyopathy) ([place])   Type 2 diabetes mellitus with stage 3b chronic kidney disease, with long-term current use of insulin ([name])   Smoker   Polysubstance abuse ([name])   Chronic systolic CHF (congestive heart failure) ([name])   Essential hypertension   AKI (acute kidney injury) ([place])  Hyperosmolar hyperglycemic state in a patient with poorly controlled type 2 diabetes Noncompliance to medications, recent known A1c 14.2.  Treated with IV insulin.  He stabilized.  Appetite is still poor. Resumed subcutaneous insulin.  Will uptitrate while he is in the hospital.  Discharge disposition pending.  Hypokalemia: Replaced with improvement.  Acute kidney injury with history of chronic kidney disease stage IIIb: Historically reported creatinine 1.4-2.2.  Treated with IV fluids.  Improved to 1.5.  Historically his baseline.  Discontinue IV fluids.  Acute metabolic encephalopathy: Due to #1.  Improving.  Chronic systolic heart failure: Ejection fraction 20% 2 years ago. Currently euvolemic.  Holding Entresto, Aldactone and torsemide.  Blood pressures are soft to start any heart  failure therapies.  Will monitor.   DVT prophylaxis: enoxaparin (LOVENOX) injection 40 mg Start: 01/06/21 2200   Code Status: Full code Family Communication: Sister on the phone 1/3 Disposition Plan: Status is: Inpatient  Remains inpatient appropriate because: Significantly elevated blood sugars, mobility pending.         Consultants:  None  Procedures:  None  Antimicrobials:  None   Subjective:  Patient seen and examined.  As usual he is with flat affect and is not very interested in talking.  His sister tells [place] that this is his baseline.  Blood sugars are acceptable.  Overnight low-grade temperature 100 recorded.  Patient himself denies any complaints.  Will check urine cultures.  Objective: Vitals:   01/06/21 2137 01/07/21 0507 01/07/21 0813 01/07/21 1013  BP: 96/64 103/62 94/64   Pulse: 62 70 73   Resp: 16 16 17    Temp: 97.7 F (36.5 C) 98.9 F (37.2 C) 100.3 F (37.9 C) 98.5 F (36.9 C)  TempSrc: Oral Oral    SpO2: 93% 96% 92%   Weight:      Height:        Intake/Output Summary (Last 24 hours) at 01/07/2021 1225 Last data filed at 01/07/2021 0900 Gross per 24 hour  Intake 3347.7 ml  Output 400 ml  Net 2947.7 ml   Filed Weights   01/05/21 2100  Weight: 70 kg    Examination:  General: Looks fairly comfortable at rest.  On room air. Flat affect.  Not keen to conversation. He is not very forthcoming for interview. Cardiovascular: S1-S2 normal.  Regular rate rhythm. Respiratory: Bilateral clear.  No added sounds. Gastrointestinal: Soft.  Nontender.  Bowel sound present. Ext: No edema or swelling.  No cyanosis.    Data Reviewed: I have personally reviewed following labs and imaging studies  CBC: Recent Labs  Lab 01/05/21 1424 01/07/21 0416  WBC 9.0 5.4  NEUTROABS 7.6 4.3  HGB 11.2* 12.4*  HCT 36.6* 36.3*  MCV 101.1* 89.2  PLT 298 366   Basic Metabolic Panel: Recent Labs  Lab 01/05/21 1424 01/05/21 1938 01/06/21 0448  01/07/21 0416  NA 131* 133* 140 137  K 5.5* 4.4 3.4* 4.0  CL 87* 90* 97* 96*  CO2 8* 13* 28 31  GLUCOSE 1,016* 885* 411* 82  BUN 93* 95* 94* 81*  CREATININE 2.43* 2.55* 2.26* 1.57*  CALCIUM 8.5* 8.5* 8.9 9.0  MG  --   --   --  3.1*  PHOS  --   --   --  2.6   GFR: Estimated Creatinine Clearance: 44.4 mL/min (A) (by C-G formula based on SCr of 1.57 mg/dL (H)). Liver Function Tests: Recent Labs  Lab 01/05/21 1424  AST 50*  ALT 33  ALKPHOS 134*  BILITOT 2.9*  PROT 6.4*  ALBUMIN 2.8*   No results for input(s): LIPASE, AMYLASE in the last 168 hours. No results for input(s): AMMONIA in the last 168 hours. Coagulation Profile: No results for input(s): INR, PROTIME in the last 168 hours. Cardiac Enzymes: No results for input(s): CKTOTAL, CKMB, CKMBINDEX, TROPONINI in the last 168 hours. BNP (last 3 results) No results for input(s): PROBNP in the last 8760 hours. HbA1C: Recent Labs    01/05/21 1938  HGBA1C 14.2*   CBG: Recent Labs  Lab 01/06/21 1448 01/06/21 1625 01/06/21 2133 01/07/21 0816 01/07/21 1141  GLUCAP 140* 124* 176* 163* 143*   Lipid Profile: No results for input(s): CHOL, HDL, LDLCALC, TRIG, CHOLHDL, LDLDIRECT in the last 72 hours. Thyroid Function Tests: No results for input(s): TSH, T4TOTAL, FREET4, T3FREE, THYROIDAB in the last 72 hours. Anemia Panel: No results for input(s): VITAMINB12, FOLATE, FERRITIN, TIBC, IRON, RETICCTPCT in the last 72 hours. Sepsis Labs: Recent Labs  Lab 01/05/21 1424 01/05/21 1938  LATICACIDVEN 1.8 1.7    Recent Results (from the past 240 hour(s))  Blood culture (single)     Status: None (Preliminary result)   Collection Time: 01/05/21  2:25 PM   Specimen: BLOOD  Result Value Ref Range Status   Specimen Description BLOOD RIGHT FA  Final   Special Requests   Final    BOTTLES DRAWN AEROBIC AND ANAEROBIC Blood Culture results may not be optimal due to an excessive volume of blood received in culture bottles   Culture    Final    NO GROWTH 2 DAYS Performed at [facility], [address], [place], [place] [address]    Report Status PENDING  Incomplete  Resp Panel by RT-PCR (Flu A&B, Covid) Nasopharyngeal Swab     Status: None   Collection Time: 01/05/21  2:50 PM   Specimen: Nasopharyngeal Swab; Nasopharyngeal(NP) swabs in vial transport medium  Result Value Ref Range Status   SARS Coronavirus 2 by RT PCR NEGATIVE NEGATIVE Final    Comment: (NOTE) SARS-CoV-2 target nucleic acids are NOT DETECTED.  The SARS-CoV-2 RNA is generally detectable in upper respiratory specimens during the acute phase of infection. The lowest concentration of SARS-CoV-2 viral copies this assay can detect is 138 copies/mL. A negative result does not preclude SARS-Cov-2 infection and should not be used as the sole basis for treatment or other patient management decisions. A negative result  may occur with  improper specimen collection/handling, submission of specimen other than nasopharyngeal swab, presence of viral mutation(s) within the areas targeted by this assay, and inadequate number of viral copies(<138 copies/mL). A negative result must be combined with clinical observations, patient history, and epidemiological information. The expected result is Negative.  Fact Sheet for Patients:  EntrepreneurPulse.com.au  Fact Sheet for Healthcare Providers:  IncredibleEmployment.be  This test is no t yet approved or cleared by the [place] FDA and  has been authorized for detection and/or diagnosis of SARS-CoV-2 by FDA under an Emergency Use Authorization (EUA). This EUA will remain  in effect (meaning this test can be used) for the duration of the COVID-19 declaration under Section 564(b)(1) of the Act, 21 U.S.C.section 360bbb-3(b)(1), unless the authorization is terminated  or revoked sooner.       Influenza A by PCR NEGATIVE NEGATIVE Final   Influenza B by PCR NEGATIVE  NEGATIVE Final    Comment: (NOTE) The Xpert Xpress SARS-CoV-2/FLU/RSV plus assay is intended as an aid in the diagnosis of influenza from Nasopharyngeal swab specimens and should not be used as a sole basis for treatment. Nasal washings and aspirates are unacceptable for Xpert Xpress SARS-CoV-2/FLU/RSV testing.  Fact Sheet for Patients: EntrepreneurPulse.com.au  Fact Sheet for Healthcare Providers: IncredibleEmployment.be  This test is not yet approved or cleared by the [place] FDA and has been authorized for detection and/or diagnosis of SARS-CoV-2 by FDA under an Emergency Use Authorization (EUA). This EUA will remain in effect (meaning this test can be used) for the duration of the COVID-19 declaration under Section 564(b)(1) of the Act, 21 U.S.C. section 360bbb-3(b)(1), unless the authorization is terminated or revoked.  Performed at [facility], [address], [place], [place] [address]   Urine Culture     Status: Abnormal   Collection Time: 01/05/21 10:37 PM   Specimen: Urine, Random  Result Value Ref Range Status   Specimen Description   Final    URINE, RANDOM Performed at [facility], [address] [place], [place] [address]    Special Requests   Final    NONE Performed at [facility], [address] [place],  [address]    Culture MULTIPLE SPECIES PRESENT, SUGGEST RECOLLECTION (A)  Final   Report Status 01/07/2021 FINAL  Final         Radiology Studies: CT HEAD WO CONTRAST (5MM)  Result Date: 01/05/2021 CLINICAL DATA:  Altered mental status. EXAM: CT HEAD WITHOUT CONTRAST TECHNIQUE: Contiguous axial images were obtained from the base of the skull through the vertex without intravenous contrast. COMPARISON:  March 16, 2020. FINDINGS: Brain: No evidence of acute infarction, hemorrhage, hydrocephalus, extra-axial collection or mass lesion/mass effect. Vascular: No hyperdense vessel  or unexpected calcification. Skull: Normal. Negative for fracture or focal lesion. Sinuses/Orbits: Right ethmoid sinusitis is noted. Other: None. IMPRESSION: No acute intracranial abnormality seen.  Right ethmoid sinusitis. Electronically Signed   By: Marijo Conception M.D.   On: 01/05/2021 15:44   DG Chest Port 1 View  Result Date: 01/05/2021 CLINICAL DATA:  Nausea. EXAM: PORTABLE CHEST 1 VIEW COMPARISON:  March 16, 2020 FINDINGS: The heart size and mediastinal contours are stable. The aorta is tortuous. Mild increased pulmonary interstitium is identified bilaterally. The lung volumes are low. There is no pleural effusion or focal pneumonia. The visualized skeletal structures are unremarkable. IMPRESSION: Mild interstitial edema. Electronically Signed   By: Abelardo Diesel M.D.   On: 01/05/2021 15:06  Scheduled Meds:  aspirin  81 mg Oral Daily   atorvastatin  20 mg Oral Daily   enoxaparin (LOVENOX) injection  40 mg Subcutaneous Q24H   insulin aspart  0-15 Units Subcutaneous TID WC   insulin aspart  0-5 Units Subcutaneous QHS   insulin aspart  3 Units Subcutaneous TID WC   insulin detemir  15 Units Subcutaneous Daily   pantoprazole (PROTONIX) IV  40 mg Intravenous Q24H   potassium chloride  20 mEq Oral BID   Continuous Infusions:     LOS: 2 days    Time spent: 30 minutes    Barb Merino, MD [facility] Pager [phone]

## 2021-01-07 NOTE — Progress Notes (Signed)
PT Cancellation Note  Patient Details Name: Shawn [name] MRN: [id number] DOB: Nov 10, 1955   Cancelled Treatment:    Reason Eval/Treat Not Completed: Other (comment): Per OT, pt lethargic and OT recommended holding PT evaluation until PM.  Will attempt to see pt at a future date/time as medically appropriate.     Linus Salmons PT, DPT 01/07/21, 10:05 AM

## 2021-01-08 LAB — GLUCOSE, CAPILLARY
Glucose-Capillary: 125 mg/dL — ABNORMAL HIGH (ref 70–99)
Glucose-Capillary: 147 mg/dL — ABNORMAL HIGH (ref 70–99)
Glucose-Capillary: 203 mg/dL — ABNORMAL HIGH (ref 70–99)
Glucose-Capillary: 257 mg/dL — ABNORMAL HIGH (ref 70–99)
Glucose-Capillary: 52 mg/dL — ABNORMAL LOW (ref 70–99)
Glucose-Capillary: 56 mg/dL — ABNORMAL LOW (ref 70–99)
Glucose-Capillary: 74 mg/dL (ref 70–99)

## 2021-01-08 LAB — BASIC METABOLIC PANEL
Anion gap: 10 (ref 5–15)
BUN: 78 mg/dL — ABNORMAL HIGH (ref 8–23)
CO2: 32 mmol/L (ref 22–32)
Calcium: 8.8 mg/dL — ABNORMAL LOW (ref 8.9–10.3)
Chloride: 96 mmol/L — ABNORMAL LOW (ref 98–111)
Creatinine, Ser: 1.31 mg/dL — ABNORMAL HIGH (ref 0.61–1.24)
GFR, Estimated: >60 mL/min (ref >60)
Glucose, Bld: 134 mg/dL — ABNORMAL HIGH (ref 70–99)
Potassium: 4.2 mmol/L (ref 3.5–5.1)
Sodium: 138 mmol/L (ref 135–145)

## 2021-01-08 MED ORDER — POTASSIUM CHLORIDE CRYS ER 20 MEQ PO TBCR
20.0000 meq | EXTENDED_RELEASE_TABLET | Freq: Every day | ORAL | Status: DC
  Administered 2021-01-09: 20 meq via ORAL
  Filled 2021-01-08: qty 1

## 2021-01-08 MED ORDER — GLUCERNA SHAKE PO LIQD
237.0000 mL | Freq: Three times a day (TID) | ORAL | Status: DC
  Administered 2021-01-08 – 2021-01-09 (×4): 237 mL via ORAL

## 2021-01-08 MED ORDER — CARVEDILOL 6.25 MG PO TABS
6.2500 mg | ORAL_TABLET | Freq: Two times a day (BID) | ORAL | Status: DC
  Administered 2021-01-08 – 2021-01-09 (×2): 6.25 mg via ORAL
  Filled 2021-01-08 (×2): qty 1

## 2021-01-08 MED ORDER — SPIRONOLACTONE 25 MG PO TABS
25.0000 mg | ORAL_TABLET | Freq: Every day | ORAL | Status: DC
  Administered 2021-01-08 – 2021-01-09 (×2): 25 mg via ORAL
  Filled 2021-01-08 (×2): qty 1

## 2021-01-08 MED ORDER — DAPAGLIFLOZIN PROPANEDIOL 10 MG PO TABS
10.0000 mg | ORAL_TABLET | Freq: Every day | ORAL | Status: DC
  Administered 2021-01-09: 10 mg via ORAL
  Filled 2021-01-08: qty 1

## 2021-01-08 MED ORDER — ADULT MULTIVITAMIN W/MINERALS CH
1.0000 | ORAL_TABLET | Freq: Every day | ORAL | Status: DC
  Administered 2021-01-09: 1 via ORAL
  Filled 2021-01-08: qty 1

## 2021-01-08 MED ORDER — SACUBITRIL-VALSARTAN 24-26 MG PO TABS
1.0000 | ORAL_TABLET | Freq: Two times a day (BID) | ORAL | Status: DC
  Administered 2021-01-08 – 2021-01-09 (×3): 1 via ORAL
  Filled 2021-01-08 (×6): qty 1

## 2021-01-08 NOTE — TOC Initial Note (Signed)
Transition of Care [facility]) - Initial/Assessment Note    Patient Details  Name: Shawn [name] MRN: [id number] Date of Birth: 09/20/1955  Transition of Care [facility]) CM/SW Contact:    Beverly Sessions, RN Phone Number: 01/08/2021, 1:23 PM  Clinical Narrative:                 Admitted OFB:PZWCHENIDPOE hyperglycemic state Admitted from:From alone [facility] - patient either drives himself, or sister leslie transports  Current home health/prior home health/DME: RW and cane Therapy recommending SNF.  Patient in agreement and request that I peak with his sisters Magda Paganini and [name].  They were together when I called.  They are both in agreement to SNF.  If [facility] not and option their first choice is [place] sent for signature Bed search initaited   Expected Discharge Plan: Skilled Nursing Facility Barriers to Discharge: Continued Medical Work up, SNF Pending Medicaid   Patient Goals and CMS Choice        Expected Discharge Plan and Services Expected Discharge Plan: [place]   Discharge Planning Services: CM Consult                                          Prior Living Arrangements/Services   Lives with:: Self Patient language and need for interpreter reviewed:: Yes Do you feel safe going back to the place where you live?: Yes      Need for Family Participation in Patient Care: Yes (Comment) Care giver support system in place?: Yes (comment) Current home services: DME Criminal Activity/Legal Involvement Pertinent to Current Situation/Hospitalization: No - Comment as needed  Activities of Daily Living Home Assistive Devices/Equipment: Cane (specify quad or straight) ADL Screening (condition at time of admission) Patient's cognitive ability adequate to safely complete daily activities?: Yes Is the patient deaf or have difficulty hearing?: No Does the patient have difficulty seeing, even when wearing  glasses/contacts?: Yes Does the patient have difficulty concentrating, remembering, or making decisions?: No Patient able to express need for assistance with ADLs?: Yes Does the patient have difficulty dressing or bathing?: No Independently performs ADLs?: Yes (appropriate for developmental age) Does the patient have difficulty walking or climbing stairs?: Yes Weakness of Legs: Both Weakness of Arms/Hands: Both  Permission Sought/Granted      Share Information with NAME: Magda Paganini and [name]           Emotional Assessment       Orientation: : Oriented to Self, Oriented to Place Alcohol / Substance Use: Alcohol Use, Tobacco Use Psych Involvement: No (comment)  Admission diagnosis:  Hyperglycemia [R73.9] Altered mental status, unspecified altered mental status type [R41.82] Diabetic ketoacidosis without coma associated with type 2 diabetes mellitus ([place]) [E11.10] Hyperosmolar hyperglycemic state (HHS) ([place]) [E11.00] Patient Active Problem List   Diagnosis Date Noted   Confusion    Hyperosmolar hyperglycemic state (HHS) ([place]) 31/54/0086   Acute metabolic encephalopathy 76/19/5093   Elevated troponin 03/16/2020   HLD (hyperlipidemia) 03/16/2020   Abnormal CXR 03/16/2020   Hypothermia 03/16/2020   Hypotension 04/30/2019   Stroke ([place])    GERD (gastroesophageal reflux disease)    Elevated lactic acid level    Dehydration    Protein-calorie malnutrition, severe ([place]) 04/25/2019   Hyperosmolar non-ketotic state due to type 2 diabetes mellitus ([place]) 04/23/2019   Hyperglycemia due to type 2 diabetes mellitus ([place]) 04/23/2019  Hyperkalemia 04/23/2019   Acute renal failure superimposed on stage 3b chronic kidney disease ([place]) 04/23/2019   Diarrhea 04/23/2019   Congestive heart failure with cardiomyopathy ([place]) 01/18/2019   AKI (acute kidney injury) ([place])    Fluid retention    Nonketotic hyperglycinemia ([place]) 09/22/2018   Nonketotic hypoglycemia 09/22/2018   DKA, type 2 ([place])  08/07/2018   Essential hypertension 07/37/1062   Chronic systolic CHF (congestive heart failure) ([place]) 12/15/2017   NICM (nonischemic cardiomyopathy) ([place]) 05/13/2017   Type 2 diabetes mellitus with stage 3b chronic kidney disease, with long-term current use of insulin ([place]) 05/13/2017   Smoker 05/13/2017   Polysubstance abuse ([place]) 05/13/2017   Alcohol abuse 05/13/2017   Diabetic ketoacidosis without coma associated with type 2 diabetes mellitus ([place]) 04/27/2017   PCP:  [facility], [place], [place] [address] Phone: [phone] Fax: [phone]     Social Determinants of Health (SDOH) Interventions    Readmission Risk Interventions Readmission Risk Prevention Plan 01/08/2021  Transportation Screening Complete  Social Work Consult for [place] Planning/Counseling Complete  Palliative Care Screening Not Applicable  Medication Review Press photographer) Complete  Some recent data might be hidden

## 2021-01-08 NOTE — Progress Notes (Signed)
Inpatient Diabetes Program Recommendations  AACE/ADA: New Consensus Statement on Inpatient Glycemic Control (2015)  Target Ranges:  Prepandial:   less than 140 mg/dL      Peak postprandial:   less than 180 mg/dL (1-2 hours)      Critically ill patients:  140 - 180 mg/dL   Lab Results  Component Value Date   GLUCAP 257 (H) 01/08/2021   HGBA1C 14.2 (H) 01/05/2021    Review of Glycemic Control  Latest Reference Range & Units 01/08/21 07:39 01/08/21 11:07  Glucose-Capillary 70 - 99 mg/dL 203 (H) 257 (H)  (H): Data is abnormally high  Inpatient Diabetes Program Recommendations:    Novolog 5 units mctid if eats at least 50%  Will continue to follow while inpatient.  Thank you, Reche Dixon, MSN, RN Diabetes Coordinator Inpatient Diabetes Program [phone] (team pager from 8a-5p)

## 2021-01-08 NOTE — Progress Notes (Signed)
PT Cancellation Note  Patient Details Name: Shawn [name] MRN: [id number] DOB: 1955/02/08   Cancelled Treatment:    Reason Eval/Treat Not Completed: Other (comment). Second attempt made. Pt initially agreeable to participate. With cuing and education on exercises to perform, pt closing eyes and not participating. Encouraged and educated on benefits of exercise for functional mobility. Pt continuing to not respond. Pt left with all needs in reach. Will re-attempt at later time/date as able.   Salem Caster. Fairly IV, PT, DPT Physical Therapist- [facility]  01/08/2021, 3:34 PM

## 2021-01-08 NOTE — NC FL2 (Signed)
[place] LEVEL OF CARE SCREENING TOOL     IDENTIFICATION  Patient Name: Shawn [name] Birthdate: September 25, 1955 Sex: male Admission Date (Current Location): 01/05/2021  [facility] and [place] Number:  Engineering geologist and Address:         Provider Number: [id number]  Attending Physician Name and Address:  Barb Merino, MD  Relative Name and Phone Number:       Current Level of Care: Hospital Recommended Level of Care: [place] Prior Approval Number:    Date Approved/Denied:   PASRR Number: [id number]  Discharge Plan: SNF    Current Diagnoses: Patient Active Problem List   Diagnosis Date Noted   Confusion    Hyperosmolar hyperglycemic state (HHS) ([place]) 38/10/1749   Acute metabolic encephalopathy 02/58/5277   Elevated troponin 03/16/2020   HLD (hyperlipidemia) 03/16/2020   Abnormal CXR 03/16/2020   Hypothermia 03/16/2020   Hypotension 04/30/2019   Stroke ([place])    GERD (gastroesophageal reflux disease)    Elevated lactic acid level    Dehydration    Protein-calorie malnutrition, severe ([place]) 04/25/2019   Hyperosmolar non-ketotic state due to type 2 diabetes mellitus ([place]) 04/23/2019   Hyperglycemia due to type 2 diabetes mellitus ([place]) 04/23/2019   Hyperkalemia 04/23/2019   Acute renal failure superimposed on stage 3b chronic kidney disease ([place]) 04/23/2019   Diarrhea 04/23/2019   Congestive heart failure with cardiomyopathy ([place]) 01/18/2019   AKI (acute kidney injury) ([place])    Fluid retention    Nonketotic hyperglycinemia ([place]) 09/22/2018   Nonketotic hypoglycemia 09/22/2018   DKA, type 2 ([place]) 08/07/2018   Essential hypertension 82/42/3536   Chronic systolic CHF (congestive heart failure) ([place]) 12/15/2017   NICM (nonischemic cardiomyopathy) ([place]) 05/13/2017   Type 2 diabetes mellitus with stage 3b chronic kidney disease, with long-term current use of insulin ([place]) 05/13/2017   Smoker 05/13/2017   Polysubstance abuse  ([place]) 05/13/2017   Alcohol abuse 05/13/2017   Diabetic ketoacidosis without coma associated with type 2 diabetes mellitus ([place]) 04/27/2017    Orientation RESPIRATION BLADDER Height & Weight     Self, Place  Normal Incontinent, External catheter Weight: 70 kg Height:  5\' 7"  (170.2 cm)  BEHAVIORAL SYMPTOMS/MOOD NEUROLOGICAL BOWEL NUTRITION STATUS      Incontinent Diet (Carb modified)  AMBULATORY STATUS COMMUNICATION OF NEEDS Skin   Extensive Assist Verbally Normal                       Personal Care Assistance Level of Assistance              Functional Limitations Info             SPECIAL CARE FACTORS FREQUENCY  OT (By licensed OT), PT (By licensed PT)                    Contractures Contractures Info: Not present    Additional Factors Info  Code Status, Allergies Code Status Info: Full Allergies Info: Pravastatin, Simvastatin           Current Medications (01/08/2021):  This is the current hospital active medication list Current Facility-Administered Medications  Medication Dose Route Frequency Provider Last Rate Last Admin   acetaminophen (TYLENOL) tablet 650 mg  650 mg Oral Q6H PRN Clarnce Flock, MD   650 mg at 01/07/21 1443   Or   acetaminophen (TYLENOL) suppository 650 mg  650 mg Rectal Q6H PRN Clarnce Flock, MD  aspirin chewable tablet 81 mg  81 mg Oral Daily Clarnce Flock, MD   81 mg at 01/08/21 8592   atorvastatin (LIPITOR) tablet 20 mg  20 mg Oral Daily Clarnce Flock, MD   20 mg at 01/08/21 9244   carvedilol (COREG) tablet 6.25 mg  6.25 mg Oral BID WC Barb Merino, MD       Derrill Memo ON 01/09/2021] dapagliflozin propanediol (FARXIGA) tablet 10 mg  10 mg Oral QAC breakfast Barb Merino, MD       dextrose 50 % solution 0-50 mL  0-50 mL Intravenous PRN Clarnce Flock, MD       enoxaparin (LOVENOX) injection 40 mg  40 mg Subcutaneous Q24H Rito Ehrlich A, RPH   40 mg at 01/07/21 2145   guaiFENesin-dextromethorphan  (ROBITUSSIN DM) 100-10 MG/5ML syrup 5 mL  5 mL Oral Q4H PRN Barb Merino, MD   5 mL at 01/08/21 0436   insulin aspart (novoLOG) injection 0-15 Units  0-15 Units Subcutaneous TID WC Barb Merino, MD   8 Units at 01/08/21 1224   insulin aspart (novoLOG) injection 0-5 Units  0-5 Units Subcutaneous QHS Barb Merino, MD       insulin aspart (novoLOG) injection 3 Units  3 Units Subcutaneous TID WC Barb Merino, MD   3 Units at 01/08/21 1223   insulin detemir (LEVEMIR) injection 15 Units  15 Units Subcutaneous Daily Barb Merino, MD   15 Units at 01/08/21 0848   ondansetron (ZOFRAN) tablet 4 mg  4 mg Oral Q6H PRN Clarnce Flock, MD       Or   ondansetron [facility]) injection 4 mg  4 mg Intravenous Q6H PRN Clarnce Flock, MD       oxyCODONE (Oxy IR/ROXICODONE) immediate release tablet 5 mg  5 mg Oral Q4H PRN Clarnce Flock, MD       pantoprazole (PROTONIX) injection 40 mg  40 mg Intravenous Q24H Clarnce Flock, MD   40 mg at 01/07/21 1717   polyethylene glycol (MIRALAX / GLYCOLAX) packet 17 g  17 g Oral Daily PRN Clarnce Flock, MD       [START ON 01/09/2021] potassium chloride SA (KLOR-CON M) CR tablet 20 mEq  20 mEq Oral Daily Ghimire, Dante Gang, MD       sacubitril-valsartan (ENTRESTO) 24-26 mg per tablet  1 tablet Oral BID Barb Merino, MD   1 tablet at 01/08/21 1223   spironolactone (ALDACTONE) tablet 25 mg  25 mg Oral Daily Barb Merino, MD   25 mg at 01/08/21 1223     Discharge Medications: Please see discharge summary for a list of discharge medications.  Relevant Imaging Results:  Relevant Lab Results:   Additional Information ss [id number]  Beverly Sessions, RN

## 2021-01-08 NOTE — Progress Notes (Signed)
Patient attempted to sit up on the side of the bed for dinner. He required assistance go from lying to sitting. Once on the side of the bed he was unable to remain seated but instead would "fall back" in the bed. Patient is very weak. Patient refused PT today. I encouraged him not to refuse PT in the future

## 2021-01-08 NOTE — TOC Progression Note (Signed)
Transition of Care [facility]) - Progression Note    Patient Details  Name: Shawn [name] MRN: [id number] Date of Birth: 09/01/1955  Transition [facility]) CM/SW Contact  Beverly Sessions, RN Phone Number: 01/08/2021, 4:31 PM  Clinical Narrative:     Bed offers presented to sister [name].  She has selected Ardis Rowan at [place] notified and to start auth  Expected Discharge Plan: [place] Barriers to Discharge: Continued Medical Work up, SNF Pending Medicaid  Expected Discharge Plan and Services Expected Discharge Plan: [place]   Discharge Planning Services: CM Consult                                           Social Determinants of Health (SDOH) Interventions    Readmission Risk Interventions Readmission Risk Prevention Plan 01/08/2021  Transportation Screening Complete  Social Work Consult for [place] Planning/Counseling [place] Not Applicable  Medication Review Press photographer) Complete  Some recent data might be hidden

## 2021-01-08 NOTE — Progress Notes (Signed)
PROGRESS NOTE    Shawn [name]  FFM:[id number] DOB: 11/24/1955 DOA: 01/05/2021 PCP: [facility]    Brief Narrative:  66 year old with history of poorly controlled type 2 diabetes with multiple episodes of hyperglycemic crisis, chronic systolic heart failure, polysubstance use disorder, hypertension presented to ER with confusion.  Sister called EMS because he was in the bed for 4 days without really getting up and not eating or drinking or taking any of his medications.  In the emergency room, hemodynamically stable.  CMP with blood glucose of 1016, creatinine 2.4, CO2 8 with anion gap of 36.  Fluid resuscitation is started.  Treated with IV insulin and admitted to the hospital.   Assessment & Plan:   Principal Problem:   Hyperosmolar hyperglycemic state (HHS) ([place]) Active Problems:   NICM (nonischemic cardiomyopathy) ([place])   Type 2 diabetes mellitus with stage 3b chronic kidney disease, with long-term current use of insulin ([name])   Smoker   Polysubstance abuse ([name])   Chronic systolic CHF (congestive heart failure) ([name])   Essential hypertension   AKI (acute kidney injury) ([place])  Hyperosmolar hyperglycemic state in a patient with poorly controlled type 2 diabetes Noncompliance to medications, A1c 14.2.  Treated with IV insulin.  Resume home dose of insulin.  Since she was not taking insulin at home, will continue similar dose.  Hypokalemia: Replaced with improvement.  Acute kidney injury with history of chronic kidney disease stage IIIb: Historically reported creatinine 1.4-2.2.  Treated with IV fluids.  Improved to 1.3..  Historically his baseline.  Discontinue IV fluids.  Acute metabolic encephalopathy: Due to #1.  Improving.  Chronic systolic heart failure: Ejection fraction 20% , 2 years ago. Currently euvolemic. Blood pressures adequately improved.  Resume Entresto, Aldactone and beta-blockers.  Will monitor response to treatment and tolerance to medications  while in the hospital today.    DVT prophylaxis: enoxaparin (LOVENOX) injection 40 mg Start: 01/06/21 2200   Code Status: Full code Family Communication: None. Disposition Plan: Status is: Inpatient  Remains inpatient appropriate because:  Medically improved.  Will need SNF placement.    Consultants:  Palliative care.  Procedures:  None  Antimicrobials:  None   Subjective:  Patient seen and examined.  With flat affect but more interactive today.  He tells me he is not taking medication as he supposed to.  He is agreeable to go to rehab.  He tells me he may be able to manage to go home but he is agreeable to control his blood sugars, adjust his insulin so that he can do it at home.  Objective: Vitals:   01/07/21 1503 01/07/21 1944 01/08/21 0507 01/08/21 0738  BP: 122/81 113/77 125/71 135/86  Pulse: 83 69 74 73  Resp: 20 18 16 18   Temp: 98.6 F (37 C) 98.2 F (36.8 C) 99.2 F (37.3 C) 98 F (36.7 C)  TempSrc: Oral   Oral  SpO2: 94% 94% 97% 95%  Weight:      Height:        Intake/Output Summary (Last 24 hours) at 01/08/2021 1051 Last data filed at 01/08/2021 0615 Gross per 24 hour  Intake 480 ml  Output 600 ml  Net -120 ml   Filed Weights   01/05/21 2100  Weight: 70 kg    Examination:  General: Looks fairly comfortable at rest.  On room air. Flat affect.  Conversation but only answers what is asked. Cardiovascular: S1-S2 normal.  Regular rate rhythm. Respiratory: Bilateral clear.  No added  sounds. Gastrointestinal: Soft.  Nontender.  Bowel sound present. Ext: No edema or swelling.  No cyanosis.    Data Reviewed: I have personally reviewed following labs and imaging studies  CBC: Recent Labs  Lab 01/05/21 1424 01/07/21 0416  WBC 9.0 5.4  NEUTROABS 7.6 4.3  HGB 11.2* 12.4*  HCT 36.6* 36.3*  MCV 101.1* 89.2  PLT 298 774   Basic Metabolic Panel: Recent Labs  Lab 01/05/21 1424 01/05/21 1938 01/06/21 0448 01/07/21 0416 01/08/21 0511  NA  131* 133* 140 137 138  K 5.5* 4.4 3.4* 4.0 4.2  CL 87* 90* 97* 96* 96*  CO2 8* 13* 28 31 32  GLUCOSE 1,016* 885* 411* 82 134*  BUN 93* 95* 94* 81* 78*  CREATININE 2.43* 2.55* 2.26* 1.57* 1.31*  CALCIUM 8.5* 8.5* 8.9 9.0 8.8*  MG  --   --   --  3.1*  --   PHOS  --   --   --  2.6  --    GFR: Estimated Creatinine Clearance: 53.3 mL/min (A) (by C-G formula based on SCr of 1.31 mg/dL (H)). Liver Function Tests: Recent Labs  Lab 01/05/21 1424  AST 50*  ALT 33  ALKPHOS 134*  BILITOT 2.9*  PROT 6.4*  ALBUMIN 2.8*   No results for input(s): LIPASE, AMYLASE in the last 168 hours. No results for input(s): AMMONIA in the last 168 hours. Coagulation Profile: No results for input(s): INR, PROTIME in the last 168 hours. Cardiac Enzymes: No results for input(s): CKTOTAL, CKMB, CKMBINDEX, TROPONINI in the last 168 hours. BNP (last 3 results) No results for input(s): PROBNP in the last 8760 hours. HbA1C: Recent Labs    01/05/21 1938  HGBA1C 14.2*   CBG: Recent Labs  Lab 01/07/21 0816 01/07/21 1141 01/07/21 1617 01/07/21 2104 01/08/21 0739  GLUCAP 163* 143* 116* 89 203*   Lipid Profile: No results for input(s): CHOL, HDL, LDLCALC, TRIG, CHOLHDL, LDLDIRECT in the last 72 hours. Thyroid Function Tests: No results for input(s): TSH, T4TOTAL, FREET4, T3FREE, THYROIDAB in the last 72 hours. Anemia Panel: No results for input(s): VITAMINB12, FOLATE, FERRITIN, TIBC, IRON, RETICCTPCT in the last 72 hours. Sepsis Labs: Recent Labs  Lab 01/05/21 1424 01/05/21 1938  LATICACIDVEN 1.8 1.7    Recent Results (from the past 240 hour(s))  Blood culture (single)     Status: None (Preliminary result)   Collection Time: 01/05/21  2:25 PM   Specimen: BLOOD  Result Value Ref Range Status   Specimen Description BLOOD RIGHT FA  Final   Special Requests   Final    BOTTLES DRAWN AEROBIC AND ANAEROBIC Blood Culture results may not be optimal due to an excessive volume of blood received in  culture bottles   Culture   Final    NO GROWTH 3 DAYS Performed at [facility], [address] [place], [place] [address]    Report Status PENDING  Incomplete  Resp Panel by RT-PCR (Flu A&B, Covid) Nasopharyngeal Swab     Status: None   Collection Time: 01/05/21  2:50 PM   Specimen: Nasopharyngeal Swab; Nasopharyngeal(NP) swabs in vial transport medium  Result Value Ref Range Status   SARS Coronavirus 2 by RT PCR NEGATIVE NEGATIVE Final    Comment: (NOTE) SARS-CoV-2 target nucleic acids are NOT DETECTED.  The SARS-CoV-2 RNA is generally detectable in upper respiratory specimens during the acute phase of infection. The lowest concentration of SARS-CoV-2 viral copies this assay can detect is 138 copies/mL. A negative result does not  preclude SARS-Cov-2 infection and should not be used as the sole basis for treatment or other patient management decisions. A negative result may occur with  improper specimen collection/handling, submission of specimen other than nasopharyngeal swab, presence of viral mutation(s) within the areas targeted by this assay, and inadequate number of viral copies(<138 copies/mL). A negative result must be combined with clinical observations, patient history, and epidemiological information. The expected result is Negative.  Fact Sheet for Patients:  EntrepreneurPulse.com.au  Fact Sheet for Healthcare Providers:  IncredibleEmployment.be  This test is no t yet approved or cleared by the [place] FDA and  has been authorized for detection and/or diagnosis of SARS-CoV-2 by FDA under an Emergency Use Authorization (EUA). This EUA will remain  in effect (meaning this test can be used) for the duration of the COVID-19 declaration under Section 564(b)(1) of the Act, 21 U.S.C.section 360bbb-3(b)(1), unless the authorization is terminated  or revoked sooner.       Influenza A by PCR NEGATIVE NEGATIVE Final    Influenza B by PCR NEGATIVE NEGATIVE Final    Comment: (NOTE) The Xpert Xpress SARS-CoV-2/FLU/RSV plus assay is intended as an aid in the diagnosis of influenza from Nasopharyngeal swab specimens and should not be used as a sole basis for treatment. Nasal washings and aspirates are unacceptable for Xpert Xpress SARS-CoV-2/FLU/RSV testing.  Fact Sheet for Patients: EntrepreneurPulse.com.au  Fact Sheet for Healthcare Providers: IncredibleEmployment.be  This test is not yet approved or cleared by the [place] FDA and has been authorized for detection and/or diagnosis of SARS-CoV-2 by FDA under an Emergency Use Authorization (EUA). This EUA will remain in effect (meaning this test can be used) for the duration of the COVID-19 declaration under Section 564(b)(1) of the Act, 21 U.S.C. section 360bbb-3(b)(1), unless the authorization is terminated or revoked.  Performed at [facility], [address], [place], [place] [address]   Urine Culture     Status: Abnormal   Collection Time: 01/05/21 10:37 PM   Specimen: Urine, Random  Result Value Ref Range Status   Specimen Description   Final    URINE, RANDOM Performed at [facility], [address], [place], [place] [address]    Special Requests   Final    NONE Performed at [facility], [address] [place], [place] [address]    Culture MULTIPLE SPECIES PRESENT, SUGGEST RECOLLECTION (A)  Final   Report Status 01/07/2021 FINAL  Final         Radiology Studies: No results found.      Scheduled Meds:  aspirin  81 mg Oral Daily   atorvastatin  20 mg Oral Daily   carvedilol  6.25 mg Oral BID WC   [START ON 01/09/2021] dapagliflozin propanediol  10 mg Oral QAC breakfast   enoxaparin (LOVENOX) injection  40 mg Subcutaneous Q24H   insulin aspart  0-15 Units Subcutaneous TID WC   insulin aspart  0-5 Units Subcutaneous QHS   insulin aspart  3 Units  Subcutaneous TID WC   insulin detemir  15 Units Subcutaneous Daily   pantoprazole (PROTONIX) IV  40 mg Intravenous Q24H   [START ON 01/09/2021] potassium chloride SA  20 mEq Oral Daily   sacubitril-valsartan  1 tablet Oral BID   spironolactone  25 mg Oral Daily   Continuous Infusions:     LOS: 3 days    Time spent: 35 minutes    Barb Merino, MD [facility] Pager [phone]

## 2021-01-08 NOTE — Progress Notes (Signed)
PT Cancellation Note  Patient Details Name: Shawn [name] MRN: [id number] DOB: [birth year]   Cancelled Treatment:    Reason Eval/Treat Not Completed: Patient declined, no reason specified. Pt received alert in bed. Pt declining mobility on this time. Pt encouraged and educatde on importance of OOB mobility for maintaining strength/mobility. Even tailoring treatment to pt tolerance. Pt further declines. Will re-attempt at later time/date as able.    Salem Caster. Fairly IV, PT, DPT Physical Therapist- [facility]  01/08/2021, 11:33 AM

## 2021-01-08 NOTE — Progress Notes (Signed)
Initial Nutrition Assessment  DOCUMENTATION CODES:   Severe malnutrition in context of chronic illness  INTERVENTION:   Glucerna Shake po TID, each supplement provides 220 kcal and 10 grams of protein  MVI po daily   Mechanical soft diet   Pt at high refeed risk; recommend monitor potassium, magnesium and phosphorus labs daily until stable  NUTRITION DIAGNOSIS:   Severe Malnutrition related to chronic illness (uncontrolled DM, sub abuse) as evidenced by severe muscle depletion, severe fat depletion.  GOAL:   Patient will meet greater than or equal to 90% of their needs  MONITOR:   PO intake, Supplement acceptance, Labs, Weight trends, Skin, I & O's  REASON FOR ASSESSMENT:   Consult Assessment of nutrition requirement/status  ASSESSMENT:   66 y.o. male with past medical history of DM2 with multiple episodes of hyperglycemic crisis, chronic systolic HF, polysubstance use disorder, HTN, NICM and current smoker who is admitted with HHS.  Met with pt in room today. Pt is well known to this RD from multiple previous admits. Pt reports poor appetite and oral intake for several days pta. Pt with poor appetite and oral intake at baseline. Pt reports that he likes chocolate Ensure and he drinks this at home. Pt's lunch tray was sitting on his side table with only a few bites taken from it. RD will add supplements and MVI to help pt meet his estimated needs. RD will add mechanical soft diet in [facility]. Pt is likely at refeed risk. Per chart, pt with weight gain pta.   Medications reviewed and include: aspirin, lovenox, insulin, MVI, protonix, KCl, aldactone   Labs reviewed: K 4.2 wnl, BUN 78(H), creat 1.31(H) P 2.6 wnl, Mg 3.1(H)- 1/4 Cbgs- 257, 203 x 24 hrs AIC 14.2(H)- 1/2  NUTRITION - FOCUSED PHYSICAL EXAM:  Flowsheet Row Most Recent Value  Orbital Region Severe depletion  Upper Arm Region Severe depletion  Thoracic and Lumbar Region Severe depletion  Buccal Region  Severe depletion  Temple Region Severe depletion  Clavicle Bone Region Severe depletion  Clavicle and Acromion Bone Region Severe depletion  Scapular Bone Region Severe depletion  Dorsal Hand Severe depletion  Patellar Region Severe depletion  Anterior Thigh Region Severe depletion  Posterior Calf Region Severe depletion  Edema (RD Assessment) None  Hair Reviewed  Eyes Reviewed  Mouth Reviewed  Skin Reviewed  Nails Reviewed   Diet Order:   Diet Order             Diet Carb Modified Fluid consistency: Thin; Room service appropriate? Yes  Diet effective now                  EDUCATION NEEDS:   Education needs have been addressed  Skin:  Skin Assessment: Reviewed RN Assessment  Last BM:  1/4- type 7  Height:   Ht Readings from Last 1 Encounters:  01/05/21 _0  (1.702 m)    Weight:   Wt Readings from Last 1 Encounters:  01/05/21 70 kg    Ideal Body Weight:  67.2 kg  BMI:  Body mass index is 24.17 kg/m.  Estimated Nutritional Needs:   Kcal:  1900-2200kcal/day  Protein:  95-110g/day  Fluid:  2.0-2.3L/day  Koleen Distance MS, RD, LDN Please refer to [facility] for RD and/or RD on-call/weekend/after hours pager

## 2021-01-09 DIAGNOSIS — Z789 Other specified health status: Secondary | ICD-10-CM

## 2021-01-09 DIAGNOSIS — R4182 Altered mental status, unspecified: Secondary | ICD-10-CM

## 2021-01-09 DIAGNOSIS — I5022 Chronic systolic (congestive) heart failure: Secondary | ICD-10-CM

## 2021-01-09 DIAGNOSIS — Z515 Encounter for palliative care: Secondary | ICD-10-CM

## 2021-01-09 DIAGNOSIS — F17200 Nicotine dependence, unspecified, uncomplicated: Secondary | ICD-10-CM

## 2021-01-09 DIAGNOSIS — N1832 Chronic kidney disease, stage 3b: Secondary | ICD-10-CM

## 2021-01-09 DIAGNOSIS — E1110 Type 2 diabetes mellitus with ketoacidosis without coma: Secondary | ICD-10-CM

## 2021-01-09 DIAGNOSIS — E1122 Type 2 diabetes mellitus with diabetic chronic kidney disease: Secondary | ICD-10-CM

## 2021-01-09 DIAGNOSIS — Z794 Long term (current) use of insulin: Secondary | ICD-10-CM

## 2021-01-09 LAB — BASIC METABOLIC PANEL
Anion gap: 7 (ref 5–15)
BUN: 72 mg/dL — ABNORMAL HIGH (ref 8–23)
CO2: 31 mmol/L (ref 22–32)
Calcium: 8.8 mg/dL — ABNORMAL LOW (ref 8.9–10.3)
Chloride: 97 mmol/L — ABNORMAL LOW (ref 98–111)
Creatinine, Ser: 0.89 mg/dL (ref 0.61–1.24)
GFR, Estimated: >60 mL/min (ref >60)
Glucose, Bld: 170 mg/dL — ABNORMAL HIGH (ref 70–99)
Potassium: 4.7 mmol/L (ref 3.5–5.1)
Sodium: 135 mmol/L (ref 135–145)

## 2021-01-09 LAB — GLUCOSE, CAPILLARY
Glucose-Capillary: 116 mg/dL — ABNORMAL HIGH (ref 70–99)
Glucose-Capillary: 178 mg/dL — ABNORMAL HIGH (ref 70–99)
Glucose-Capillary: 220 mg/dL — ABNORMAL HIGH (ref 70–99)

## 2021-01-09 LAB — CORTISOL-AM, BLOOD: Cortisol - AM: 17.1 ug/dL (ref 6.7–22.6)

## 2021-01-09 MED ORDER — INSULIN DETEMIR 100 UNIT/ML ~~LOC~~ SOLN: 15.0000 [IU] | Freq: Every day | SUBCUTANEOUS | 11 refills | Status: DC

## 2021-01-09 MED ORDER — ACETAMINOPHEN 325 MG PO TABS: 650.0000 mg | ORAL_TABLET | Freq: Four times a day (QID) | ORAL | Status: DC | PRN

## 2021-01-09 MED ORDER — GLUCERNA SHAKE PO LIQD: 237.0000 mL | Freq: Three times a day (TID) | ORAL | 0 refills | Status: DC

## 2021-01-09 MED ORDER — TORSEMIDE 20 MG PO TABS: 20.0000 mg | ORAL_TABLET | Freq: Every day | ORAL | 0 refills | Status: DC

## 2021-01-09 MED ORDER — INSULIN ASPART 100 UNIT/ML IJ SOLN: 4.0000 [IU] | Freq: Three times a day (TID) | INTRAMUSCULAR | 11 refills | Status: DC

## 2021-01-09 MED ORDER — ADULT MULTIVITAMIN W/MINERALS CH: 1.0000 | ORAL_TABLET | Freq: Every day | ORAL | Status: DC

## 2021-01-09 MED ORDER — INSULIN ASPART 100 UNIT/ML IJ SOLN: 0.0000 [IU] | Freq: Three times a day (TID) | INTRAMUSCULAR | 11 refills | Status: DC

## 2021-01-09 MED ORDER — INSULIN ASPART 100 UNIT/ML IJ SOLN
0.0000 [IU] | Freq: Every day | INTRAMUSCULAR | 11 refills | Status: DC
Start: 2021-01-09 — End: 2021-01-21

## 2021-01-09 NOTE — Progress Notes (Addendum)
°                                                   °  Palliative Care Progress Note, Assessment & Plan   Patient Name: Shawn [name]       Date: 01/09/2021 DOB: 08-03-1955  Age: 66 y.o. MRN#: [id number] Attending Physician: Barb Merino, MD Primary Care Physician: [facility] Admit Date: 01/05/2021  Reason for Consultation/Follow-up: Establishing goals of care  HPI: Patient is a 66 year old male with a past medical history significant for diabetes type 2, polysubstance abuse, current smoker, HTN, and chronic systolic heart failure admitted on 01/05/2021 with complaints of confusion x4 days.  His sisters are his historians as patient is mildly confused.  During his hospitalization patient was treated for hyperosmolar hyperglycemic state.  Blood sugars have improved.  Plan is for patient to be admitted to SNF for rehab.  Patient has excepted bed at [facility]  Palliative medicine team was consulted to discuss goals of care.  To date patient has been unwilling to speak with a PMT provider and no family has returned phone calls despite HIPAA appropriate voicemail was left.  Plan of Care: I have reviewed medical records including EPIC notes, labs and imaging, and spoke with patient's sister Magda Paganini over the phone to discuss diagnosis prognosis, [place], EOL wishes, disposition, and options.  I introduced Palliative Medicine as specialized medical care for people living with serious illness. It focuses on providing relief from the symptoms and stress of a serious illness. The goal is to improve quality of life for both the patient and the family.  We discussed a brief life review of the patient.  Magda Paganini shares the patient is a difficult personality and is mostly private. She shares he is not one to talk a lot and can be very demanding.   As  far as functional and nutritional status patient was independent with ADLs at home prior to admission.   Advance directives, concepts specific to code status, artificial feeding and hydration, and rehospitalization were considered and discussed.  Given patient's unwillingness to speak with me during his hospitalization, I recommended to [facility] that palliative outpatient follow the patient after discharge.  She was in agreement.  Outpatient palliative services described in detail.  Discussed with patient/family the importance of continued conversation with family and the medical providers regarding overall plan of care and treatment options, ensuring decisions are within the context of the patients values and GOCs.    Questions and concerns were addressed. The family was encouraged to call with questions or concerns.   Care plan was discussed with patient's sister Magda Paganini  Code Status: Full code  Prognosis:  Unable to determine  Discharge Planning: [facility] for rehab with Palliative care service follow-up  Recommendations/Plan: Continue code status and ACP discussions with Palliative Outpatient - referral placed            Total Time 15 minutes  Greater than 50%  of this time was spent counseling and coordinating care related to the above assessment and plan.  Thank you for allowing the Palliative Medicine Team to assist in the care of this patient.  [place] Ilsa Iha, FNP-BC Palliative Medicine Team Team Phone # [phone]

## 2021-01-09 NOTE — Progress Notes (Signed)
Occupational Therapy Treatment Patient Details Name: Shawn [name] MRN: [id number] DOB: [birth year] Today's Date: 01/09/2021   History of present illness Per MD Notes: Pt is a 66 y.o. male with hx of poorly controlled type 2 diabetes with multiple episodes of hyperglycemic crises, chronic systolic CHF, polysubstance use disorder, hypertension, who presents via EMS with confusion. Pt noted to have blood glucose of 1016. Fluid resuscitation is started.  Treated with IV insulin and admitted for further management.  MD assessment includes: Hyperosmolar hyperglycemic state, hypokalemia, AKI, and acute metabolic encephalopathy.   OT comments  Shawn [name] was seen for OT tx this date. He presents seated in room recliner. He denies pain, but states he is eager to return to bed. Pt continues to be functionally limited by generalized weakness & decreased activity tolerance. He demos improved cognition and alertness from past OT session. He is able to perform STS with MIN A to come to full standing from room recliner. SPT to bed with close CGA and cueing for sequencing. Pt notably more impulsive this date. He performs sit>sup t/f with supervision for safety. Education provided on safe transfer technique, falls prevention, and energy conservation provided t/o session. Pt making good progress toward goals and continues to benefit from skilled OT services to maximize return to PLOF and minimize risk of future falls, injury, caregiver burden, and readmission. Will continue to follow POC. Discharge recommendation remains appropriate.     Recommendations for follow up therapy are one component of a multi-disciplinary discharge planning process, led by the attending physician.  Recommendations may be updated based on patient status, additional functional criteria and insurance authorization.    Follow Up Recommendations  Skilled nursing-short term rehab (<3 hours/day)    Assistance Recommended at Discharge  Intermittent Supervision/Assistance  Patient can return home with the following  Assistance with feeding;Help with stairs or ramp for entrance;Assist for transportation;Assistance with cooking/housework;Two people to help with walking and/or transfers;A lot of help with walking and/or transfers;A lot of help with bathing/dressing/bathroom;Direct supervision/assist for medications management   Equipment Recommendations  BSC/3in1    Recommendations for Other Services      Precautions / Restrictions Precautions Precautions: Fall Restrictions Weight Bearing Restrictions: No Other Position/Activity Restrictions: Very impulsive       Mobility Bed Mobility Overal bed mobility: Needs Assistance Bed Mobility: Sit to Supine     Supine to sit: Supervision;HOB elevated Sit to supine: Supervision   General bed mobility comments: Pt supervision level for bed mobility this date.    Transfers Overall transfer level: Needs assistance Equipment used: Rolling walker (2 wheels) Transfers: Sit to/from Stand Sit to Stand: Min assist   Step pivot transfers: Min guard       General transfer comment: MIN A w/ cueing for hand/foot placement and sequencing for STS from room recliner.     Balance Overall balance assessment: Needs assistance Sitting-balance support: Feet supported;Bilateral upper extremity supported Sitting balance-Leahy Scale: Fair Sitting balance - Comments: Maintains static balance sitting EOB with supervision   Standing balance support: Reliant on assistive device for balance;During functional activity;Bilateral upper extremity supported Standing balance-Leahy Scale: Poor Standing balance comment: Reliant on AD for support                           ADL either performed or assessed with clinical judgement   ADL Overall ADL's : Needs assistance/impaired  General ADL Comments: Pt continues to be  functionally limited by generalized weakness & decreased activity tolerance, He demos improved cognition and alertness from past OT session. He is able to perform STS with MIN A to come to full standing from room recliner. SPT to bed with close CGA and cueing for sequencing. Pt notably more impulsive this date. He performs sit>sup t/f with supervision for safety.    Extremity/Trunk Assessment Upper Extremity Assessment Upper Extremity Assessment: Generalized weakness   Lower Extremity Assessment Lower Extremity Assessment: Generalized weakness        Vision Ability to See in Adequate Light: 1 Impaired Patient Visual Report: No change from baseline     Perception     Praxis      Cognition Arousal/Alertness: Awake/alert Behavior During Therapy: Flat affect;Impulsive Overall Cognitive Status: No family/caregiver present to determine baseline cognitive functioning                                 General Comments: Conversational and agreeable to OT session. Improved level of alertness from previous session.          Exercises General Exercises - Lower Extremity Long Arc Quad: AROM;Seated;Strengthening;Both;5 reps Hip Flexion/Marching: AROM;Seated;Strengthening;Both;10 reps Toe Raises: AROM;Seated;Strengthening;Both;10 reps Heel Raises: AROM;Seated;Strengthening;Both;10 reps Other Exercises Other Exercises: OT facilitates bed/functional mobility with pt education on safe transfer techniques, falls prevention strategies, and energy conservation t/o session. Pt educated on typical STR course/therapy expectations.   Shoulder Instructions       General Comments HR trending in mid 70's throughout mobility.    Pertinent Vitals/ Pain       Pain Assessment: No/denies pain  Home Living Family/patient expects to be discharged to:: Private residence                                        Prior Functioning/Environment              Frequency  Min  2X/week        Progress Toward Goals  OT Goals(current goals can now be found in the care plan section)  Progress towards OT goals: Progressing toward goals  Acute Rehab OT Goals Patient Stated Goal: To go back to bed. OT Goal Formulation: With patient Time For Goal Achievement: 01/21/21 Potential to Achieve Goals: Good  Plan Discharge plan remains appropriate;Frequency remains appropriate    Co-evaluation                 AM-PAC OT "6 Clicks" Daily Activity     Outcome Measure   Help from another person eating meals?: A Little Help from another person taking care of personal grooming?: A Little Help from another person toileting, which includes using toliet, bedpan, or urinal?: A Lot Help from another person bathing (including washing, rinsing, drying)?: A Lot Help from another person to put on and taking off regular upper body clothing?: A Little Help from another person to put on and taking off regular lower body clothing?: A Lot 6 Click Score: 15    End of Session Equipment Utilized During Treatment: Gait belt;Rolling walker (2 wheels)  OT Visit Diagnosis: Muscle weakness (generalized) (M62.81)   Activity Tolerance Patient limited by lethargy   Patient Left in bed;with call bell/phone within reach;with bed alarm set   Nurse Communication  Time: 8295-6213 OT Time Calculation (min): 10 min  Charges: OT General Charges $OT Visit: 1 Visit OT Treatments $Self Care/Home Management : 8-22 mins  Shara Blazing, M.S., OTR/L Feeding Team - [facility] Ascom: [phone] 01/09/21, 3:25 PM

## 2021-01-09 NOTE — Progress Notes (Signed)
Physical Therapy Treatment Patient Details Name: Shawn [name] MRN: [id number] DOB: 1955/10/17 Today's Date: 01/09/2021   History of Present Illness Per MD Notes: Pt is a 66 y.o. male with hx of poorly controlled type 2 diabetes with multiple episodes of hyperglycemic crises, chronic systolic CHF, polysubstance use disorder, hypertension, who presents via EMS with confusion. Pt noted to have blood glucose of 1016. Fluid resuscitation is started.  Treated with IV insulin and admitted for further management.  MD assessment includes: Hyperosmolar hyperglycemic state, hypokalemia, AKI, and acute metabolic encephalopathy.    PT Comments    Pt received in bed agreeable to PT. More conversant today, adamant on getting OOB. HR trending in mid 70's BPM at rest and with mobility. Pt very impulsive today and unsafe with mobility, endorsing need for urgent BM leading to unsafe standing to RW with minguard and poor use of RW and step tranfers to [facility] with poor eccentric control.  Able to stand with minguard and performing perihygiene himself with minguard for steadying. Tolerating ambulating in room to recliner with RW however is requiring max cuing and minA on RW for keeping RW closer to BOS, prevent bumping into obstacles in room and for maintaining feet inside BOS of RW with turning. Pt has poor carryover with cuing and is very unsafe with OOB mobility with poor safety awareness and use of AD despite max multimodal cuing. Pt able to sit in recliner with poor eccentric control tolerating minimal LE therex requiring max cues and encouragement to participate. Pt educated on improved safety awareness and use of RW to reduce risk of falls with upright mobility. RN notified of session and assist level needed for safe transfers. Pt left in recliner with chair alarm engaged and educated to not get up without hospital staff verbalizing understanding. Pt will continue to benefit from STR due to poor safety awareness and  poor use of AD to safely transfer and ambulate. Pt remains at high risk for falls.   Recommendations for follow up therapy are one component of a multi-disciplinary discharge planning process, led by the attending physician.  Recommendations may be updated based on patient status, additional functional criteria and insurance authorization.  Follow Up Recommendations  Skilled nursing-short term rehab (<3 hours/day)     Assistance Recommended at Discharge Frequent or constant Supervision/Assistance  Patient can return home with the following Two people to help with walking and/or transfers;Two people to help with bathing/dressing/bathroom;Direct supervision/assist for medications management;Help with stairs or ramp for entrance;Assist for transportation;Direct supervision/assist for financial management;Assistance with cooking/housework   Equipment Recommendations       Recommendations for Other Services       Precautions / Restrictions Precautions Precautions: Fall Restrictions Weight Bearing Restrictions: No Other Position/Activity Restrictions: Very impulsive     Mobility  Bed Mobility Overal bed mobility: Needs Assistance Bed Mobility: Supine to Sit     Supine to sit: Supervision;HOB elevated     General bed mobility comments: in recliner post session. Use of bed rails and HOB elevated. Patient Response: Cooperative;Impulsive  Transfers Overall transfer level: Needs assistance Equipment used: Rolling walker (2 wheels) Transfers: Sit to/from Stand;Bed to chair/wheelchair/BSC Sit to Stand: Min guard     Step pivot transfers: Min guard     General transfer comment: Very impulsive, quick motions due to urgent need for BM. Poor sequencing of RW and poor hand placement with poor eccentric control.    Ambulation/Gait Ambulation/Gait assistance: Min assist;Min guard Gait Distance (Feet): 20 Feet Assistive device: Rolling  walker (2 wheels) Gait Pattern/deviations:  Step-through pattern;Narrow base of support;Trunk flexed       General Gait Details: No buckling of LE's today, but keeps RW far outside BOS, turns with feet outside RW BOS, knocking into objects in room despite cuing for decreasing speed and for RW proximity. MinA on RW in attempts to improve safety.   Stairs             Wheelchair Mobility    Modified Rankin (Stroke Patients Only)       Balance Overall balance assessment: Needs assistance Sitting-balance support: Feet supported;Bilateral upper extremity supported Sitting balance-Leahy Scale: Fair Sitting balance - Comments: Maintains EOB with supervision   Standing balance support: Bilateral upper extremity supported;During functional activity Standing balance-Leahy Scale: Poor Standing balance comment: Reliant on AD for support                            Cognition Arousal/Alertness: Awake/alert Behavior During Therapy: Flat affect;Impulsive Overall Cognitive Status: No family/caregiver present to determine baseline cognitive functioning                                 General Comments: More conversant today, agreeable to participate.        Exercises General Exercises - Lower Extremity Long Arc Quad: AROM;Seated;Strengthening;Both;5 reps Hip Flexion/Marching: AROM;Seated;Strengthening;Both;10 reps Toe Raises: AROM;Seated;Strengthening;Both;10 reps Heel Raises: AROM;Seated;Strengthening;Both;10 reps Other Exercises Other Exercises: safe use of AD to prevent risk of falls.    General Comments General comments (skin integrity, edema, etc.): HR trending in mid 70's throughout mobility.      Pertinent Vitals/Pain Pain Assessment: No/denies pain    Home Living                          Prior Function            PT Goals (current goals can now be found in the care plan section) Acute Rehab PT Goals PT Goal Formulation: Patient unable to participate in goal setting     Frequency    Min 2X/week      PT Plan Current plan remains appropriate    Co-evaluation              AM-PAC PT "6 Clicks" Mobility   Outcome Measure  Help needed turning from your back to your side while in a flat bed without using bedrails?: A Lot Help needed moving from lying on your back to sitting on the side of a flat bed without using bedrails?: A Lot Help needed moving to and from a bed to a chair (including a wheelchair)?: A Little Help needed standing up from a chair using your arms (e.g., wheelchair or bedside chair)?: A Little Help needed to walk in hospital room?: A Little Help needed climbing 3-5 steps with a railing? : A Lot 6 Click Score: 15    End of Session Equipment Utilized During Treatment: Gait belt Activity Tolerance: Patient tolerated treatment well Patient left: in chair;with chair alarm set;with call bell/phone within reach Nurse Communication: Mobility status PT Visit Diagnosis: Unsteadiness on feet (R26.81);Difficulty in walking, not elsewhere classified (R26.2);Muscle weakness (generalized) (M62.81)     Time: [id number] PT Time Calculation (min) (ACUTE ONLY): 16 min  Charges:  $Therapeutic Exercise: 8-22 mins  Salem Caster. Fairly IV, PT, DPT Physical Therapist- [facility]  01/09/2021, 12:14 PM

## 2021-01-09 NOTE — Progress Notes (Signed)
[facility]) Hospital Liaison Note  Notified by Oleh Genin, LCSW [facility] manager of patient/family request for [facility] Palliative services at facility after discharge.  [facility] hospital liaison will follow patient for discharge disposition.  Please call with any hospice or outpatient palliative care related questions.  Thank you for the opportunity to participate in this patient's care.  Nadene Rubins, RN, BSN [place] [phone]

## 2021-01-09 NOTE — TOC Progression Note (Addendum)
Transition of Care [facility]) - Progression Note    Patient Details  Name: Shawn [name] MRN: [id number] Date of Birth: 10-Jun-1955  Transition of Care [facility]) CM/SW [place], LCSW Phone Number: 01/09/2021, 9:07 AM  Clinical Narrative:    CSW notified Bobbie with [facility] of need for OP Palliative services at DC per [facility] consult. Reached out to [place] at [facility] and asked to be updated when she gets [place].   Expected Discharge Plan: [place] Barriers to Discharge: Continued Medical Work up, SNF Pending Medicaid  Expected Discharge Plan and Services Expected Discharge Plan: [place]   Discharge Planning Services: CM Consult                                           Social Determinants of Health (SDOH) Interventions    Readmission Risk Interventions Readmission Risk Prevention Plan 01/08/2021  Transportation Screening Complete  Social Work Consult for [place] Planning/Counseling [place] Not Applicable  Medication Review Press photographer) Complete  Some recent data might be hidden

## 2021-01-09 NOTE — TOC Transition Note (Addendum)
Transition of Care [facility]) - CM/SW Discharge Note   Patient Details  Name: Shawn [name] MRN: [id number] Date of Birth: Dec 23, 1955  Transition of [facility]) CM/SW Contact:  Magnus Ivan, LCSW Phone Number: 01/09/2021, 1:30 PM   Clinical Narrative:    Discharge to [facility] today. Room 506A. Confirmed with Admissions Worker Ebony Hail. Updated MD, RN, and patient's sister [name]. She requested RN call her when EMS picks patient up. Asked RN to call report. EMS paperwork completed. Will call for EMS when RN is ready.  2:00- Called for [facility]. Patient is 6th on the list.   2:10- Call from Numidia at [facility] who confirmed they will be able to take patient today. She says he will now be  next to be picked up by EMS. Notified RN.  Final next level of care: Skilled Nursing Facility Barriers to Discharge: Barriers Resolved   Patient Goals and CMS Choice Patient states their goals for this hospitalization and ongoing recovery are:: SNF CMS Medicare.gov Compare Post Acute Care list provided to:: Patient Represenative (must comment) Choice offered to / list presented to : Sibling  Discharge Placement              Patient chooses bed at: [facility] Patient to be transferred to facility by: [facility] Name of family member notified: Ametita Patient and family notified of of transfer: 01/09/21  Discharge Plan and Services   Discharge Planning Services: CM Consult                                 Social Determinants of Health ([place]) Interventions     Readmission Risk Interventions Readmission Risk Prevention Plan 01/08/2021  Transportation Screening Complete  Social Work Consult for [place] Planning/Counseling [place] Not Applicable  Medication Review Press photographer) Complete  Some recent data might be hidden

## 2021-01-09 NOTE — Progress Notes (Signed)
PROGRESS NOTE    Shawn [name]  MEQ:[id number] DOB: 1955-07-02 DOA: 01/05/2021 PCP: [facility]    Brief Narrative:  66 year old with history of poorly controlled type 2 diabetes with multiple episodes of hyperglycemic crisis, chronic systolic heart failure, polysubstance use disorder, hypertension presented to ER with confusion.  Sister called EMS because he was in the bed for 4 days without really getting up and not eating or drinking or taking any of his medications.  In the emergency room, hemodynamically stable.  CMP with blood glucose of 1016, creatinine 2.4, CO2 8 with anion gap of 36.  Fluid resuscitation is started.  Treated with IV insulin and admitted to the hospital.   Assessment & Plan:   Principal Problem:   Hyperosmolar hyperglycemic state (HHS) ([place]) Active Problems:   NICM (nonischemic cardiomyopathy) ([place])   Type 2 diabetes mellitus with stage 3b chronic kidney disease, with long-term current use of insulin ([name])   Smoker   Polysubstance abuse ([name])   Chronic systolic CHF (congestive heart failure) ([name])   Essential hypertension   AKI (acute kidney injury) ([place])  Hyperosmolar hyperglycemic state in a patient with poorly controlled type 2 diabetes Noncompliance to medications, A1c 14.2.  Treated with IV insulin.  Resumed home dose of insulin.  Since he was not taking insulin at home, will continue similar dose.  Hypokalemia: Replaced with improvement.  Acute kidney injury with history of chronic kidney disease stage IIIb: Historically reported creatinine 1.4-2.2.  Treated with IV fluids.  Improved to 1.3..   Renal functions normalized.  Acute metabolic encephalopathy: Due to #1.  Improved.  Chronic systolic heart failure: Ejection fraction 20% , 2 years ago. Currently euvolemic. Blood pressures adequately improved.  Tolerating Entresto, Aldactone and beta-blockers.   Currently euvolemic.  Will add torsemide as needed on discharge.     DVT  prophylaxis: enoxaparin (LOVENOX) injection 40 mg Start: 01/06/21 2200   Code Status: Full code Family Communication: None. Disposition Plan: Status is: Inpatient  Remains inpatient appropriate because:  Medically improved.  Will need SNF placement.    Consultants:  Palliative care.  Procedures:  None  Antimicrobials:  None   Subjective:  Patient seen and examined.  Today he was more interactive than usual.  He finished all his meals.  Blood sugars are acceptable.  Agreeable to go to rehab.  Objective: Vitals:   01/08/21 2033 01/08/21 2207 01/09/21 0330 01/09/21 0820  BP: 112/70 104/73 117/75 116/78  Pulse: 75 70 73 72  Resp: 18  20 18   Temp: 98.2 F (36.8 C)  97.7 F (36.5 C) 98.5 F (36.9 C)  TempSrc:      SpO2: 96%  99% 98%  Weight:      Height:        Intake/Output Summary (Last 24 hours) at 01/09/2021 1152 Last data filed at 01/09/2021 1022 Gross per 24 hour  Intake 240 ml  Output 600 ml  Net -360 ml   Filed Weights   01/05/21 2100  Weight: 70 kg    Examination:  General: Looks fairly comfortable at rest.  On room air. Cardiovascular: S1-S2 normal.  Regular rate rhythm. Respiratory: Bilateral clear.  No added sounds. Gastrointestinal: Soft.  Nontender.  Bowel sound present. Ext: No edema or swelling.  No cyanosis. Alert awake and oriented x2-3.  He is with some flat affect but more interactive today.  No focal deficits.    Data Reviewed: I have personally reviewed following labs and imaging studies  CBC: Recent Labs  Lab 01/05/21 1424 01/07/21 0416  WBC 9.0 5.4  NEUTROABS 7.6 4.3  HGB 11.2* 12.4*  HCT 36.6* 36.3*  MCV 101.1* 89.2  PLT 298 893   Basic Metabolic Panel: Recent Labs  Lab 01/05/21 1938 01/06/21 0448 01/07/21 0416 01/08/21 0511 01/09/21 0542  NA 133* 140 137 138 135  K 4.4 3.4* 4.0 4.2 4.7  CL 90* 97* 96* 96* 97*  CO2 13* 28 31 32 31  GLUCOSE 885* 411* 82 134* 170*  BUN 95* 94* 81* 78* 72*  CREATININE 2.55* 2.26*  1.57* 1.31* 0.89  CALCIUM 8.5* 8.9 9.0 8.8* 8.8*  MG  --   --  3.1*  --   --   PHOS  --   --  2.6  --   --    GFR: Estimated Creatinine Clearance: 78.4 mL/min (by C-G formula based on SCr of 0.89 mg/dL). Liver Function Tests: Recent Labs  Lab 01/05/21 1424  AST 50*  ALT 33  ALKPHOS 134*  BILITOT 2.9*  PROT 6.4*  ALBUMIN 2.8*   No results for input(s): LIPASE, AMYLASE in the last 168 hours. No results for input(s): AMMONIA in the last 168 hours. Coagulation Profile: No results for input(s): INR, PROTIME in the last 168 hours. Cardiac Enzymes: No results for input(s): CKTOTAL, CKMB, CKMBINDEX, TROPONINI in the last 168 hours. BNP (last 3 results) No results for input(s): PROBNP in the last 8760 hours. HbA1C: No results for input(s): HGBA1C in the last 72 hours.  CBG: Recent Labs  Lab 01/08/21 2201 01/08/21 2235 01/08/21 2344 01/09/21 0046 01/09/21 0804  GLUCAP 56* 74 125* 116* 220*   Lipid Profile: No results for input(s): CHOL, HDL, LDLCALC, TRIG, CHOLHDL, LDLDIRECT in the last 72 hours. Thyroid Function Tests: No results for input(s): TSH, T4TOTAL, FREET4, T3FREE, THYROIDAB in the last 72 hours. Anemia Panel: No results for input(s): VITAMINB12, FOLATE, FERRITIN, TIBC, IRON, RETICCTPCT in the last 72 hours. Sepsis Labs: Recent Labs  Lab 01/05/21 1424 01/05/21 1938  LATICACIDVEN 1.8 1.7    Recent Results (from the past 240 hour(s))  Blood culture (single)     Status: None (Preliminary result)   Collection Time: 01/05/21  2:25 PM   Specimen: BLOOD  Result Value Ref Range Status   Specimen Description BLOOD RIGHT FA  Final   Special Requests   Final    BOTTLES DRAWN AEROBIC AND ANAEROBIC Blood Culture results may not be optimal due to an excessive volume of blood received in culture bottles   Culture   Final    NO GROWTH 4 DAYS Performed at [facility], [address] [place], [place] [address]    Report Status PENDING  Incomplete  Resp Panel  by RT-PCR (Flu A&B, Covid) Nasopharyngeal Swab     Status: None   Collection Time: 01/05/21  2:50 PM   Specimen: Nasopharyngeal Swab; Nasopharyngeal(NP) swabs in vial transport medium  Result Value Ref Range Status   SARS Coronavirus 2 by RT PCR NEGATIVE NEGATIVE Final    Comment: (NOTE) SARS-CoV-2 target nucleic acids are NOT DETECTED.  The SARS-CoV-2 RNA is generally detectable in upper respiratory specimens during the acute phase of infection. The lowest concentration of SARS-CoV-2 viral copies this assay can detect is 138 copies/mL. A negative result does not preclude SARS-Cov-2 infection and should not be used as the sole basis for treatment or other patient management decisions. A negative result may occur with  improper specimen collection/handling, submission of specimen other than nasopharyngeal swab, presence of viral  mutation(s) within the areas targeted by this assay, and inadequate number of viral copies(<138 copies/mL). A negative result must be combined with clinical observations, patient history, and epidemiological information. The expected result is Negative.  Fact Sheet for Patients:  EntrepreneurPulse.com.au  Fact Sheet for Healthcare Providers:  IncredibleEmployment.be  This test is no t yet approved or cleared by the [place] FDA and  has been authorized for detection and/or diagnosis of SARS-CoV-2 by FDA under an Emergency Use Authorization (EUA). This EUA will remain  in effect (meaning this test can be used) for the duration of the COVID-19 declaration under Section 564(b)(1) of the Act, 21 U.S.C.section 360bbb-3(b)(1), unless the authorization is terminated  or revoked sooner.       Influenza A by PCR NEGATIVE NEGATIVE Final   Influenza B by PCR NEGATIVE NEGATIVE Final    Comment: (NOTE) The Xpert Xpress SARS-CoV-2/FLU/RSV plus assay is intended as an aid in the diagnosis of influenza from Nasopharyngeal swab  specimens and should not be used as a sole basis for treatment. Nasal washings and aspirates are unacceptable for Xpert Xpress SARS-CoV-2/FLU/RSV testing.  Fact Sheet for Patients: EntrepreneurPulse.com.au  Fact Sheet for Healthcare Providers: IncredibleEmployment.be  This test is not yet approved or cleared by the [place] FDA and has been authorized for detection and/or diagnosis of SARS-CoV-2 by FDA under an Emergency Use Authorization (EUA). This EUA will remain in effect (meaning this test can be used) for the duration of the COVID-19 declaration under Section 564(b)(1) of the Act, 21 U.S.C. section 360bbb-3(b)(1), unless the authorization is terminated or revoked.  Performed at [facility], [address], [place], [place] [address]   Urine Culture     Status: Abnormal   Collection Time: 01/05/21 10:37 PM   Specimen: Urine, Random  Result Value Ref Range Status   Specimen Description   Final    URINE, RANDOM Performed at [facility], [address], [place], [place] [address]    Special Requests   Final    NONE Performed at [facility], [address] [place], [place] [address]    Culture MULTIPLE SPECIES PRESENT, SUGGEST RECOLLECTION (A)  Final   Report Status 01/07/2021 FINAL  Final  Urine Culture     Status: None (Preliminary result)   Collection Time: 01/07/21  2:57 PM   Specimen: Urine, Random  Result Value Ref Range Status   Specimen Description   Final    URINE, RANDOM Performed at [facility], [address] [place], [place] [address]    Special Requests   Final    NONE Performed at [facility], [address], [place], [place] [address]    Culture   Final    CULTURE REINCUBATED FOR BETTER GROWTH Performed at [facility], [place] [address] [place], [place] [address]    Report Status PENDING  Incomplete         Radiology Studies: No results  found.      Scheduled Meds:  aspirin  81 mg Oral Daily   atorvastatin  20 mg Oral Daily   carvedilol  6.25 mg Oral BID WC   dapagliflozin propanediol  10 mg Oral QAC breakfast   enoxaparin (LOVENOX) injection  40 mg Subcutaneous Q24H   feeding supplement (GLUCERNA SHAKE)  237 mL Oral TID BM   insulin aspart  0-15 Units Subcutaneous TID WC   insulin aspart  0-5 Units Subcutaneous QHS   insulin aspart  3 Units Subcutaneous TID  WC   insulin detemir  15 Units Subcutaneous Daily   multivitamin with minerals  1 tablet Oral Daily   pantoprazole (PROTONIX) IV  40 mg Intravenous Q24H   potassium chloride SA  20 mEq Oral Daily   sacubitril-valsartan  1 tablet Oral BID   spironolactone  25 mg Oral Daily   Continuous Infusions:     LOS: 4 days    Time spent: 35 minutes    Barb Merino, MD [facility] Pager [phone]

## 2021-01-09 NOTE — Progress Notes (Signed)
Pt discharged per MD order. IV removed. Discharge paperwork sent with pt to facility. RN attempted to call report with no success x2. Case manager gave RNs number to facility and facility to call for report when ready.

## 2021-01-09 NOTE — Progress Notes (Addendum)
2133 CBG: 52, 8 oz of orange juice and peanut butter crackers given and glucerna given, notified provider. Provider order to recheck CBG in 30 mins and then every hour x2. Patient feeling weak more than normal but better after eating and having bowel movement  2201:  CBG: 56 another 8 oz, peanut butter crackers and protein shake given.   2235:  CBG: 74  2344: CBG: 125  0046: 116  Patient rested well throughout the night. Robitussin given for cough

## 2021-01-09 NOTE — Discharge Summary (Signed)
Physician Discharge Summary  Shawn GIENGER QQV:[id number] DOB: [birth year] DOA: 01/05/2021  PCP: [facility]  Admit date: 01/05/2021 Discharge date: 01/09/2021  Admitted From: Home Disposition: Skilled nursing facility  Recommendations for Outpatient Follow-up:  Follow up with PCP in 1-2 weeks Please obtain BMP/CBC in one week Schedule follow-up with cardiology after discharge from nursing home.   Home Health: N/A Equipment/Devices: N/A  Discharge Condition: Stable CODE STATUS: Full code Diet recommendation: Low-salt and low-carb diet  Discharge summary: 66 year old with history of poorly controlled type 2 diabetes with multiple episodes of hyperglycemic crisis, chronic systolic heart failure, polysubstance use disorder, hypertension presented to ER with confusion.  Sister called EMS because he was in the bed for 4 days without really getting up and not eating or drinking or taking any of his medications.  In the emergency room, hemodynamically stable.  CMP with blood glucose of 1016, creatinine 2.4, CO2 8 with anion gap of 36.  Treated with IV insulin and admitted to the hospital.   Hyperosmolar hyperglycemic state in a patient with poorly controlled type 2 diabetes Noncompliance to medications, A1c 14.2.  Treated with IV insulin.  Patient was not compliant to insulin.  His blood sugars are better on his home doses. Will continue long-acting insulin 15 units a day, prandial insulin 4 units with meals and keep on sliding scale insulin. Could be uptitrated at a skilled nursing facility.  Once he is discharged from SNF, he may be able to maintain on 1 time long-acting insulin and three times a day prandial insulin.   Hypokalemia: Replaced with improvement.  Acute kidney injury with history of chronic kidney disease stage IIIb: Historically reported creatinine 1.4-2.2.  Treated with IV fluids.  Improved to 1.3..   Renal functions normalized.   Acute metabolic  encephalopathy: Due to #1.  Improved.  Chronic systolic heart failure: Ejection fraction 20% , 2 years ago. Currently euvolemic. Blood pressures adequately improved.  Tolerating Entresto, Aldactone and beta-blockers.   Currently euvolemic.  Will add torsemide as needed on discharge. To use torsemide as needed for weight gain, leg swelling or shortness of breath.  Likely does not need to use it.  Discharge Diagnoses:  Principal Problem:   Hyperosmolar hyperglycemic state (HHS) ([place]) Active Problems:   NICM (nonischemic cardiomyopathy) ([place])   Type 2 diabetes mellitus with stage 3b chronic kidney disease, with long-term current use of insulin ([name])   Smoker   Polysubstance abuse ([place])   Chronic systolic CHF (congestive heart failure) ([place])   Essential hypertension   AKI (acute kidney injury) [facility])    Discharge Instructions  Discharge Instructions     Call MD for:  difficulty breathing, headache or visual disturbances   Complete by: As directed    Call MD for:  persistant dizziness or light-headedness   Complete by: As directed    Call MD for:  persistant nausea and vomiting   Complete by: As directed    Diet - low sodium heart healthy   Complete by: As directed    Diet Carb Modified   Complete by: As directed    Increase activity slowly   Complete by: As directed       Allergies as of 01/09/2021       Reactions   Pravastatin    Other reaction(s): Muscle pain   Simvastatin    Other reaction(s): Muscle pain        Medication List     STOP taking these medications    insulin  glargine 100 UNIT/ML injection Commonly known as: LANTUS       TAKE these medications    acetaminophen 325 MG tablet Commonly known as: TYLENOL Take 2 tablets (650 mg total) by mouth every 6 (six) hours as needed for mild pain (or Fever >/= 101).   aspirin 81 MG chewable tablet Chew 1 tablet (81 mg total) by mouth daily.   atorvastatin 20 MG tablet Commonly known as: LIPITOR Take  20 mg by mouth daily.   B-Complex-C Tabs Take 1 tablet by mouth daily.   carvedilol 6.25 MG tablet Commonly known as: COREG Take 6.25 mg by mouth 2 (two) times daily with a meal.   dapagliflozin propanediol 10 MG Tabs tablet Commonly known as: Farxiga Take 1 tablet (10 mg total) by mouth daily before breakfast. MUST KEEP APPT FOR FURTHER REFILLS   Entresto 24-26 MG Generic drug: sacubitril-valsartan TAKE 1 TABLET BY MOUTH TWICE A DAY   feeding supplement (GLUCERNA SHAKE) Liqd Take 237 mLs by mouth 3 (three) times daily between meals.   insulin aspart 100 UNIT/ML injection Commonly known as: novoLOG Inject 0-15 Units into the skin 3 (three) times daily with meals.   insulin aspart 100 UNIT/ML injection Commonly known as: novoLOG Inject 0-5 Units into the skin at bedtime.   insulin aspart 100 UNIT/ML injection Commonly known as: novoLOG Inject 4 Units into the skin 3 (three) times daily with meals.   insulin detemir 100 UNIT/ML injection Commonly known as: LEVEMIR Inject 0.15 mLs (15 Units total) into the skin daily. Start taking on: January 10, 2021   multivitamin with minerals Tabs tablet Take 1 tablet by mouth daily. Start taking on: January 10, 2021   omeprazole 20 MG capsule Commonly known as: PRILOSEC Take 20 mg by mouth daily.   potassium chloride SA 20 MEQ tablet Commonly known as: KLOR-CON M Take 20 mEq by mouth daily.   spironolactone 25 MG tablet Commonly known as: ALDACTONE Take 25 mg by mouth daily.   torsemide 20 MG tablet Commonly known as: DEMADEX Take 1 tablet (20 mg total) by mouth daily. What changed:  when to take this reasons to take this   Vitamin D3 1.25 MG (50000 UT) Tabs Take 1 tablet by mouth daily.        Contact information for after-discharge care     [place] .   Service: Skilled Nursing Contact information: [address] [place]  [place] [phone]                    Allergies  Allergen Reactions   Pravastatin     Other reaction(s): Muscle pain   Simvastatin     Other reaction(s): Muscle pain    Consultations: None   Procedures/Studies: CT HEAD WO CONTRAST (5MM)  Result Date: 01/05/2021 CLINICAL DATA:  Altered mental status. EXAM: CT HEAD WITHOUT CONTRAST TECHNIQUE: Contiguous axial images were obtained from the base of the skull through the vertex without intravenous contrast. COMPARISON:  March 16, 2020. FINDINGS: Brain: No evidence of acute infarction, hemorrhage, hydrocephalus, extra-axial collection or mass lesion/mass effect. Vascular: No hyperdense vessel or unexpected calcification. Skull: Normal. Negative for fracture or focal lesion. Sinuses/Orbits: Right ethmoid sinusitis is noted. Other: None. IMPRESSION: No acute intracranial abnormality seen.  Right ethmoid sinusitis. Electronically Signed   By: Marijo Conception M.D.   On: 01/05/2021 15:44   DG Chest Port 1 View  Result Date: 01/05/2021 CLINICAL DATA:  Nausea. EXAM: PORTABLE CHEST 1 VIEW COMPARISON:  March 16, 2020 FINDINGS: The heart size and mediastinal contours are stable. The aorta is tortuous. Mild increased pulmonary interstitium is identified bilaterally. The lung volumes are low. There is no pleural effusion or focal pneumonia. The visualized skeletal structures are unremarkable. IMPRESSION: Mild interstitial edema. Electronically Signed   By: Abelardo Diesel M.D.   On: 01/05/2021 15:06   (Echo, Carotid, EGD, Colonoscopy, ERCP)    Subjective: Patient seen and examined.  No overnight events.  Blood sugars acceptable.  Appetite has improved.  Agreeable to go to rehab.  He tells me that he is getting rehab and going home after rehab and he will take all his medications.   Discharge Exam: Vitals:   01/09/21 0330 01/09/21 0820  BP: 117/75 116/78  Pulse: 73 72  Resp: 20 18  Temp: 97.7 F (36.5 C) 98.5 F (36.9 C)  SpO2: 99% 98%    Vitals:   01/08/21 2033 01/08/21 2207 01/09/21 0330 01/09/21 0820  BP: 112/70 104/73 117/75 116/78  Pulse: 75 70 73 72  Resp: 18  20 18   Temp: 98.2 F (36.8 C)  97.7 F (36.5 C) 98.5 F (36.9 C)  TempSrc:      SpO2: 96%  99% 98%  Weight:      Height:        General: Pt is alert, awake, not in acute distress Thin and frail. Cardiovascular: RRR, S1/S2 +, no rubs, no gallops Respiratory: CTA bilaterally, no wheezing, no rhonchi Abdominal: Soft, NT, ND, bowel sounds + Extremities: no edema, no cyanosis    The results of significant diagnostics from this hospitalization (including imaging, microbiology, ancillary and laboratory) are listed below for reference.     Microbiology: Recent Results (from the past 240 hour(s))  Blood culture (single)     Status: None (Preliminary result)   Collection Time: 01/05/21  2:25 PM   Specimen: BLOOD  Result Value Ref Range Status   Specimen Description BLOOD RIGHT FA  Final   Special Requests   Final    BOTTLES DRAWN AEROBIC AND ANAEROBIC Blood Culture results may not be optimal due to an excessive volume of blood received in culture bottles   Culture   Final    NO GROWTH 4 DAYS Performed at [facility], [address], [place], [place] [address]    Report Status PENDING  Incomplete  Resp Panel by RT-PCR (Flu A&B, Covid) Nasopharyngeal Swab     Status: None   Collection Time: 01/05/21  2:50 PM   Specimen: Nasopharyngeal Swab; Nasopharyngeal(NP) swabs in vial transport medium  Result Value Ref Range Status   SARS Coronavirus 2 by RT PCR NEGATIVE NEGATIVE Final    Comment: (NOTE) SARS-CoV-2 target nucleic acids are NOT DETECTED.  The SARS-CoV-2 RNA is generally detectable in upper respiratory specimens during the acute phase of infection. The lowest concentration of SARS-CoV-2 viral copies this assay can detect is 138 copies/mL. A negative result does not preclude SARS-Cov-2 infection and should not be used as the sole  basis for treatment or other patient management decisions. A negative result may occur with  improper specimen collection/handling, submission of specimen other than nasopharyngeal swab, presence of viral mutation(s) within the areas targeted by this assay, and inadequate number of viral copies(<138 copies/mL). A negative result must be combined with clinical observations, patient history, and epidemiological information. The expected result is Negative.  Fact Sheet for Patients:  EntrepreneurPulse.com.au  Fact Sheet for Healthcare Providers:  IncredibleEmployment.be  This test is no t yet approved or cleared by the [place] and  has been authorized for detection and/or diagnosis of SARS-CoV-2 by FDA under an Emergency Use Authorization (EUA). This EUA will remain  in effect (meaning this test can be used) for the duration of the COVID-19 declaration under Section 564(b)(1) of the Act, 21 U.S.C.section 360bbb-3(b)(1), unless the authorization is terminated  or revoked sooner.       Influenza A by PCR NEGATIVE NEGATIVE Final   Influenza B by PCR NEGATIVE NEGATIVE Final    Comment: (NOTE) The Xpert Xpress SARS-CoV-2/FLU/RSV plus assay is intended as an aid in the diagnosis of influenza from Nasopharyngeal swab specimens and should not be used as a sole basis for treatment. Nasal washings and aspirates are unacceptable for Xpert Xpress SARS-CoV-2/FLU/RSV testing.  Fact Sheet for Patients: EntrepreneurPulse.com.au  Fact Sheet for Healthcare Providers: IncredibleEmployment.be  This test is not yet approved or cleared by the [place] FDA and has been authorized for detection and/or diagnosis of SARS-CoV-2 by FDA under an Emergency Use Authorization (EUA). This EUA will remain in effect (meaning this test can be used) for the duration of the COVID-19 declaration under Section 564(b)(1) of the Act,  21 U.S.C. section 360bbb-3(b)(1), unless the authorization is terminated or revoked.  Performed at [facility], [address], [place], [place] [address]   Urine Culture     Status: Abnormal   Collection Time: 01/05/21 10:37 PM   Specimen: Urine, Random  Result Value Ref Range Status   Specimen Description   Final    URINE, RANDOM Performed at [facility], [address], [place], [place] [address]    Special Requests   Final    NONE Performed at [facility], [address] [place], [place] [address]    Culture MULTIPLE SPECIES PRESENT, SUGGEST RECOLLECTION (A)  Final   Report Status 01/07/2021 FINAL  Final  Urine Culture     Status: Abnormal (Preliminary result)   Collection Time: 01/07/21  2:57 PM   Specimen: Urine, Random  Result Value Ref Range Status   Specimen Description   Final    URINE, RANDOM Performed at [facility], [address], [place],  [place] [address]    Special Requests   Final    NONE Performed at [facility], [address] [place], [place] [address]    Culture (A)  Final    10,000 COLONIES/mL ENTEROCOCCUS FAECALIS SUSCEPTIBILITIES TO FOLLOW Performed at [facility], [place] [address] [place], [place] [address]    Report Status PENDING  Incomplete     Labs: BNP (last 3 results) Recent Labs    03/16/20 1008  BNP 5,361.4*   Basic Metabolic Panel: Recent Labs  Lab 01/05/21 1938 01/06/21 0448 01/07/21 0416 01/08/21 0511 01/09/21 0542  NA 133* 140 137 138 135  K 4.4 3.4* 4.0 4.2 4.7  CL 90* 97* 96* 96* 97*  CO2 13* 28 31 32 31  GLUCOSE 885* 411* 82 134* 170*  BUN 95* 94* 81* 78* 72*  CREATININE 2.55* 2.26* 1.57* 1.31* 0.89  CALCIUM 8.5* 8.9 9.0 8.8* 8.8*  MG  --   --  3.1*  --   --   PHOS  --   --  2.6  --   --    Liver Function Tests: Recent Labs  Lab 01/05/21 1424  AST 50*  ALT 33  ALKPHOS 134*  BILITOT 2.9*  PROT 6.4*  ALBUMIN 2.8*  No results for input(s):  LIPASE, AMYLASE in the last 168 hours. No results for input(s): AMMONIA in the last 168 hours. CBC: Recent Labs  Lab 01/05/21 1424 01/07/21 0416  WBC 9.0 5.4  NEUTROABS 7.6 4.3  HGB 11.2* 12.4*  HCT 36.6* 36.3*  MCV 101.1* 89.2  PLT 298 274   Cardiac Enzymes: No results for input(s): CKTOTAL, CKMB, CKMBINDEX, TROPONINI in the last 168 hours. BNP: Invalid input(s): POCBNP CBG: Recent Labs  Lab 01/08/21 2235 01/08/21 2344 01/09/21 0046 01/09/21 0804 01/09/21 1134  GLUCAP 74 125* 116* 220* 178*   D-Dimer No results for input(s): DDIMER in the last 72 hours. Hgb A1c No results for input(s): HGBA1C in the last 72 hours. Lipid Profile No results for input(s): CHOL, HDL, LDLCALC, TRIG, CHOLHDL, LDLDIRECT in the last 72 hours. Thyroid function studies No results for input(s): TSH, T4TOTAL, T3FREE, THYROIDAB in the last 72 hours.  Invalid input(s): FREET3 Anemia work up No results for input(s): VITAMINB12, FOLATE, FERRITIN, TIBC, IRON, RETICCTPCT in the last 72 hours. Urinalysis    Component Value Date/Time   COLORURINE STRAW (A) 01/05/2021 2237   APPEARANCEUR CLEAR (A) 01/05/2021 2237   LABSPEC 1.018 01/05/2021 2237   PHURINE 5.0 01/05/2021 2237   GLUCOSEU >=500 (A) 01/05/2021 2237   HGBUR SMALL (A) 01/05/2021 2237   BILIRUBINUR NEGATIVE 01/05/2021 2237   KETONESUR 20 (A) 01/05/2021 2237   PROTEINUR NEGATIVE 01/05/2021 2237   NITRITE NEGATIVE 01/05/2021 2237   LEUKOCYTESUR NEGATIVE 01/05/2021 2237   Sepsis Labs Invalid input(s): PROCALCITONIN,  WBC,  LACTICIDVEN Microbiology Recent Results (from the past 240 hour(s))  Blood culture (single)     Status: None (Preliminary result)   Collection Time: 01/05/21  2:25 PM   Specimen: BLOOD  Result Value Ref Range Status   Specimen Description BLOOD RIGHT FA  Final   Special Requests   Final    BOTTLES DRAWN AEROBIC AND ANAEROBIC Blood Culture results may not be optimal due to an excessive volume of blood received in  culture bottles   Culture   Final    NO GROWTH 4 DAYS Performed at [facility], [address], [place], [place] [address]    Report Status PENDING  Incomplete  Resp Panel by RT-PCR (Flu A&B, Covid) Nasopharyngeal Swab     Status: None   Collection Time: 01/05/21  2:50 PM   Specimen: Nasopharyngeal Swab; Nasopharyngeal(NP) swabs in vial transport medium  Result Value Ref Range Status   SARS Coronavirus 2 by RT PCR NEGATIVE NEGATIVE Final    Comment: (NOTE) SARS-CoV-2 target nucleic acids are NOT DETECTED.  The SARS-CoV-2 RNA is generally detectable in upper respiratory specimens during the acute phase of infection. The lowest concentration of SARS-CoV-2 viral copies this assay can detect is 138 copies/mL. A negative result does not preclude SARS-Cov-2 infection and should not be used as the sole basis for treatment or other patient management decisions. A negative result may occur with  improper specimen collection/handling, submission of specimen other than nasopharyngeal swab, presence of viral mutation(s) within the areas targeted by this assay, and inadequate number of viral copies(<138 copies/mL). A negative result must be combined with clinical observations, patient history, and epidemiological information. The expected result is Negative.  Fact Sheet for Patients:  EntrepreneurPulse.com.au  Fact Sheet for Healthcare Providers:  IncredibleEmployment.be  This test is no t yet approved or cleared by the [place] FDA and  has been authorized for detection and/or diagnosis of SARS-CoV-2 by FDA under an Emergency  Use Authorization (EUA). This EUA will remain  in effect (meaning this test can be used) for the duration of the COVID-19 declaration under Section 564(b)(1) of the Act, 21 U.S.C.section 360bbb-3(b)(1), unless the authorization is terminated  or revoked sooner.       Influenza A by PCR NEGATIVE NEGATIVE Final    Influenza B by PCR NEGATIVE NEGATIVE Final    Comment: (NOTE) The Xpert Xpress SARS-CoV-2/FLU/RSV plus assay is intended as an aid in the diagnosis of influenza from Nasopharyngeal swab specimens and should not be used as a sole basis for treatment. Nasal washings and aspirates are unacceptable for Xpert Xpress SARS-CoV-2/FLU/RSV testing.  Fact Sheet for Patients: EntrepreneurPulse.com.au  Fact Sheet for Healthcare Providers: IncredibleEmployment.be  This test is not yet approved or cleared by the [place] FDA and has been authorized for detection and/or diagnosis of SARS-CoV-2 by FDA under an Emergency Use Authorization (EUA). This EUA will remain in effect (meaning this test can be used) for the duration of the COVID-19 declaration under Section 564(b)(1) of the Act, 21 U.S.C. section 360bbb-3(b)(1), unless the authorization is terminated or revoked.  Performed at [facility], [address] [place], [place] [address]   Urine Culture     Status: Abnormal   Collection Time: 01/05/21 10:37 PM   Specimen: Urine, Random  Result Value Ref Range Status   Specimen Description   Final    URINE, RANDOM Performed at [facility], [address], [place], [place] [address]    Special Requests   Final    NONE Performed at [facility], [address] [place], [place] [address]    Culture MULTIPLE SPECIES PRESENT, SUGGEST RECOLLECTION (A)  Final   Report Status 01/07/2021 FINAL  Final  Urine Culture     Status: Abnormal (Preliminary result)   Collection Time: 01/07/21  2:57 PM   Specimen: Urine, Random  Result Value Ref Range Status   Specimen Description   Final    URINE, RANDOM Performed at [facility], [address] [place], [place] [address]    Special Requests   Final    NONE Performed at [facility], [address], [place], [place] [address]    Culture (A)  Final    10,000  COLONIES/mL ENTEROCOCCUS FAECALIS SUSCEPTIBILITIES TO FOLLOW Performed at [facility], [place] [address] [place], [place] [address]    Report Status PENDING  Incomplete     Time coordinating discharge: 45 minutes  SIGNED:   Barb Merino, MD  [facility] 01/09/2021, 12:59 PM

## 2021-01-10 LAB — URINE CULTURE: Culture: 10000 — AB

## 2021-01-10 LAB — CULTURE, BLOOD (SINGLE): Culture: NO GROWTH

## 2021-01-21 ENCOUNTER — Encounter (HOSPITAL_COMMUNITY): Payer: Self-pay | Admitting: Family Medicine

## 2021-01-21 ENCOUNTER — Inpatient Hospital Stay (HOSPITAL_COMMUNITY)
Admission: EM | Admit: 2021-01-21 | Discharge: 2021-01-25 | DRG: 640 | Disposition: A | Discharge status: Home Health | Payer: No Typology Code available for payment source | Attending: Family Medicine | Admitting: Family Medicine

## 2021-01-21 ENCOUNTER — Emergency Department (HOSPITAL_COMMUNITY): Payer: No Typology Code available for payment source

## 2021-01-21 ENCOUNTER — Other Ambulatory Visit: Payer: Self-pay

## 2021-01-21 DIAGNOSIS — R410 Disorientation, unspecified: Secondary | ICD-10-CM

## 2021-01-21 DIAGNOSIS — I130 Hypertensive heart and chronic kidney disease with heart failure and stage 1 through stage 4 chronic kidney disease, or unspecified chronic kidney disease: Secondary | ICD-10-CM | POA: Diagnosis present

## 2021-01-21 DIAGNOSIS — Z833 Family history of diabetes mellitus: Secondary | ICD-10-CM

## 2021-01-21 DIAGNOSIS — E11649 Type 2 diabetes mellitus with hypoglycemia without coma: Secondary | ICD-10-CM | POA: Diagnosis present

## 2021-01-21 DIAGNOSIS — Z8673 Personal history of transient ischemic attack (TIA), and cerebral infarction without residual deficits: Secondary | ICD-10-CM

## 2021-01-21 DIAGNOSIS — E871 Hypo-osmolality and hyponatremia: Secondary | ICD-10-CM | POA: Diagnosis present

## 2021-01-21 DIAGNOSIS — Z794 Long term (current) use of insulin: Secondary | ICD-10-CM

## 2021-01-21 DIAGNOSIS — N1832 Chronic kidney disease, stage 3b: Secondary | ICD-10-CM | POA: Diagnosis present

## 2021-01-21 DIAGNOSIS — S0990XA Unspecified injury of head, initial encounter: Secondary | ICD-10-CM | POA: Diagnosis not present

## 2021-01-21 DIAGNOSIS — G9341 Metabolic encephalopathy: Secondary | ICD-10-CM | POA: Diagnosis present

## 2021-01-21 DIAGNOSIS — R4182 Altered mental status, unspecified: Secondary | ICD-10-CM | POA: Diagnosis not present

## 2021-01-21 DIAGNOSIS — Z23 Encounter for immunization: Secondary | ICD-10-CM

## 2021-01-21 DIAGNOSIS — I5042 Chronic combined systolic (congestive) and diastolic (congestive) heart failure: Secondary | ICD-10-CM | POA: Diagnosis present

## 2021-01-21 DIAGNOSIS — Z888 Allergy status to other drugs, medicaments and biological substances status: Secondary | ICD-10-CM

## 2021-01-21 DIAGNOSIS — I10 Essential (primary) hypertension: Secondary | ICD-10-CM | POA: Diagnosis present

## 2021-01-21 DIAGNOSIS — W19XXXA Unspecified fall, initial encounter: Secondary | ICD-10-CM | POA: Diagnosis not present

## 2021-01-21 DIAGNOSIS — N390 Urinary tract infection, site not specified: Secondary | ICD-10-CM | POA: Diagnosis present

## 2021-01-21 DIAGNOSIS — I5022 Chronic systolic (congestive) heart failure: Secondary | ICD-10-CM | POA: Diagnosis present

## 2021-01-21 DIAGNOSIS — E785 Hyperlipidemia, unspecified: Secondary | ICD-10-CM | POA: Diagnosis present

## 2021-01-21 DIAGNOSIS — Z8249 Family history of ischemic heart disease and other diseases of the circulatory system: Secondary | ICD-10-CM

## 2021-01-21 DIAGNOSIS — E162 Hypoglycemia, unspecified: Secondary | ICD-10-CM

## 2021-01-21 DIAGNOSIS — E872 Acidosis, unspecified: Secondary | ICD-10-CM | POA: Diagnosis present

## 2021-01-21 DIAGNOSIS — E119 Type 2 diabetes mellitus without complications: Secondary | ICD-10-CM

## 2021-01-21 DIAGNOSIS — Z79899 Other long term (current) drug therapy: Secondary | ICD-10-CM

## 2021-01-21 DIAGNOSIS — Z7982 Long term (current) use of aspirin: Secondary | ICD-10-CM

## 2021-01-21 DIAGNOSIS — F1910 Other psychoactive substance abuse, uncomplicated: Secondary | ICD-10-CM | POA: Diagnosis present

## 2021-01-21 DIAGNOSIS — E1122 Type 2 diabetes mellitus with diabetic chronic kidney disease: Secondary | ICD-10-CM | POA: Diagnosis present

## 2021-01-21 DIAGNOSIS — I428 Other cardiomyopathies: Secondary | ICD-10-CM

## 2021-01-21 DIAGNOSIS — I5023 Acute on chronic systolic (congestive) heart failure: Secondary | ICD-10-CM | POA: Diagnosis present

## 2021-01-21 DIAGNOSIS — K219 Gastro-esophageal reflux disease without esophagitis: Secondary | ICD-10-CM | POA: Diagnosis present

## 2021-01-21 DIAGNOSIS — E860 Dehydration: Principal | ICD-10-CM | POA: Diagnosis present

## 2021-01-21 DIAGNOSIS — F1010 Alcohol abuse, uncomplicated: Secondary | ICD-10-CM | POA: Diagnosis present

## 2021-01-21 DIAGNOSIS — N179 Acute kidney failure, unspecified: Secondary | ICD-10-CM | POA: Diagnosis present

## 2021-01-21 DIAGNOSIS — Z20822 Contact with and (suspected) exposure to covid-19: Secondary | ICD-10-CM | POA: Diagnosis present

## 2021-01-21 LAB — CBC WITH DIFFERENTIAL/PLATELET
Abs Immature Granulocytes: 0.03 10*3/uL (ref 0.00–0.07)
Basophils Absolute: 0 10*3/uL (ref 0.0–0.1)
Basophils Relative: 0 %
Eosinophils Absolute: 0 10*3/uL (ref 0.0–0.5)
Eosinophils Relative: 0 %
HCT: 38.1 % — ABNORMAL LOW (ref 39.0–52.0)
Hemoglobin: 12.6 g/dL — ABNORMAL LOW (ref 13.0–17.0)
Immature Granulocytes: 0 %
Lymphocytes Relative: 9 %
Lymphs Abs: 0.8 10*3/uL (ref 0.7–4.0)
MCH: 31.7 pg (ref 26.0–34.0)
MCHC: 33.1 g/dL (ref 30.0–36.0)
MCV: 96 fL (ref 80.0–100.0)
Monocytes Absolute: 0.3 10*3/uL (ref 0.1–1.0)
Monocytes Relative: 4 %
Neutro Abs: 7.3 10*3/uL (ref 1.7–7.7)
Neutrophils Relative %: 87 %
Platelets: 506 10*3/uL — ABNORMAL HIGH (ref 150–400)
RBC: 3.97 MIL/uL — ABNORMAL LOW (ref 4.22–5.81)
RDW: 14.3 % (ref 11.5–15.5)
WBC: 8.5 10*3/uL (ref 4.0–10.5)
nRBC: 0 % (ref 0.0–0.2)

## 2021-01-21 LAB — LACTIC ACID, PLASMA
Lactic Acid, Venous: 2.3 mmol/L (ref 0.5–1.9)
Lactic Acid, Venous: 1.4 mmol/L (ref 0.5–1.9)

## 2021-01-21 LAB — URINALYSIS, MICROSCOPIC (REFLEX)

## 2021-01-21 LAB — URINALYSIS, ROUTINE W REFLEX MICROSCOPIC
Bilirubin Urine: NEGATIVE
Glucose, UA: >=500 mg/dL — AB
Ketones, ur: NEGATIVE mg/dL
Leukocytes,Ua: NEGATIVE
Nitrite: NEGATIVE
Protein, ur: 100 mg/dL — AB
Specific Gravity, Urine: 1.015 (ref 1.005–1.030)
pH: 7 (ref 5.0–8.0)

## 2021-01-21 LAB — COMPREHENSIVE METABOLIC PANEL
ALT: 40 U/L (ref 0–44)
AST: 39 U/L (ref 15–41)
Albumin: 2.8 g/dL — ABNORMAL LOW (ref 3.5–5.0)
Alkaline Phosphatase: 214 U/L — ABNORMAL HIGH (ref 38–126)
Anion gap: 11 (ref 5–15)
BUN: 38 mg/dL — ABNORMAL HIGH (ref 8–23)
CO2: 26 mmol/L (ref 22–32)
Calcium: 8.6 mg/dL — ABNORMAL LOW (ref 8.9–10.3)
Chloride: 93 mmol/L — ABNORMAL LOW (ref 98–111)
Creatinine, Ser: 1.04 mg/dL (ref 0.61–1.24)
GFR, Estimated: >60 mL/min (ref >60)
Glucose, Bld: 144 mg/dL — ABNORMAL HIGH (ref 70–99)
Potassium: 4.3 mmol/L (ref 3.5–5.1)
Sodium: 130 mmol/L — ABNORMAL LOW (ref 135–145)
Total Bilirubin: 0.7 mg/dL (ref 0.3–1.2)
Total Protein: 6.7 g/dL (ref 6.5–8.1)

## 2021-01-21 LAB — PROTIME-INR
INR: 1 (ref 0.8–1.2)
Prothrombin Time: 13.4 seconds (ref 11.4–15.2)

## 2021-01-21 LAB — HIV ANTIBODY (ROUTINE TESTING W REFLEX): HIV Screen 4th Generation wRfx: NONREACTIVE

## 2021-01-21 LAB — RAPID URINE DRUG SCREEN, HOSP PERFORMED
Amphetamines: NOT DETECTED
Barbiturates: NOT DETECTED
Benzodiazepines: NOT DETECTED
Cocaine: NOT DETECTED
Opiates: NOT DETECTED
Tetrahydrocannabinol: NOT DETECTED

## 2021-01-21 LAB — TSH: TSH: 5.451 u[IU]/mL — ABNORMAL HIGH (ref 0.350–4.500)

## 2021-01-21 LAB — CBG MONITORING, ED: Glucose-Capillary: 149 mg/dL — ABNORMAL HIGH (ref 70–99)

## 2021-01-21 LAB — RESP PANEL BY RT-PCR (FLU A&B, COVID) ARPGX2
Influenza A by PCR: NEGATIVE
Influenza B by PCR: NEGATIVE
SARS Coronavirus 2 by RT PCR: NEGATIVE

## 2021-01-21 LAB — MAGNESIUM: Magnesium: 2.5 mg/dL — ABNORMAL HIGH (ref 1.7–2.4)

## 2021-01-21 LAB — PROCALCITONIN: Procalcitonin: 0.66 ng/mL

## 2021-01-21 LAB — PHOSPHORUS: Phosphorus: 2.6 mg/dL (ref 2.5–4.6)

## 2021-01-21 LAB — APTT: aPTT: 31 seconds (ref 24–36)

## 2021-01-21 MED ORDER — CHLORHEXIDINE GLUCONATE CLOTH 2 % EX PADS
6.0000 | MEDICATED_PAD | Freq: Every day | CUTANEOUS | Status: DC
  Administered 2021-01-22 – 2021-01-23 (×2): 6 via TOPICAL

## 2021-01-21 MED ORDER — ENOXAPARIN SODIUM 40 MG/0.4ML IJ SOSY
40.0000 mg | PREFILLED_SYRINGE | Freq: Every day | INTRAMUSCULAR | Status: DC
  Administered 2021-01-22: 40 mg via SUBCUTANEOUS
  Filled 2021-01-21 (×5): qty 0.4

## 2021-01-21 MED ORDER — POLYVINYL ALCOHOL 1.4 % OP SOLN
1.0000 [drp] | OPHTHALMIC | Status: DC | PRN
  Administered 2021-01-21: 1 [drp] via OPHTHALMIC
  Filled 2021-01-21: qty 15

## 2021-01-21 MED ORDER — SODIUM CHLORIDE 0.9 % IV SOLN
1.0000 g | INTRAVENOUS | Status: AC
  Administered 2021-01-22 – 2021-01-24 (×3): 1 g via INTRAVENOUS
  Filled 2021-01-21 (×3): qty 10

## 2021-01-21 MED ORDER — SODIUM CHLORIDE 0.9 % IV BOLUS
1000.0000 mL | Freq: Once | INTRAVENOUS | Status: AC
  Administered 2021-01-21: 1000 mL via INTRAVENOUS

## 2021-01-21 MED ORDER — DOCUSATE SODIUM 100 MG PO CAPS
100.0000 mg | ORAL_CAPSULE | Freq: Two times a day (BID) | ORAL | Status: DC
  Administered 2021-01-21 – 2021-01-25 (×8): 100 mg via ORAL
  Filled 2021-01-21 (×9): qty 1

## 2021-01-21 MED ORDER — DEXTROSE 50 % IV SOLN: 0.0000 mL | INTRAVENOUS | Status: DC | PRN

## 2021-01-21 MED ORDER — INSULIN REGULAR(HUMAN) IN NACL 100-0.9 UT/100ML-% IV SOLN: INTRAVENOUS | Status: DC

## 2021-01-21 MED ORDER — CARVEDILOL 3.125 MG PO TABS
6.2500 mg | ORAL_TABLET | Freq: Two times a day (BID) | ORAL | Status: DC
  Administered 2021-01-21 – 2021-01-25 (×8): 6.25 mg via ORAL
  Filled 2021-01-21 (×8): qty 2

## 2021-01-21 MED ORDER — ACETAMINOPHEN 325 MG PO TABS
650.0000 mg | ORAL_TABLET | Freq: Four times a day (QID) | ORAL | Status: DC | PRN
  Administered 2021-01-21 – 2021-01-22 (×2): 650 mg via ORAL
  Filled 2021-01-21 (×2): qty 2

## 2021-01-21 MED ORDER — SODIUM CHLORIDE 0.9 % IV BOLUS: 2000.0000 mL | Freq: Once | INTRAVENOUS | Status: DC

## 2021-01-21 MED ORDER — ONDANSETRON HCL 4 MG PO TABS: 4.0000 mg | ORAL_TABLET | Freq: Four times a day (QID) | ORAL | Status: DC | PRN

## 2021-01-21 MED ORDER — LACTATED RINGERS IV SOLN: INTRAVENOUS | Status: DC

## 2021-01-21 MED ORDER — DEXTROSE IN LACTATED RINGERS 5 % IV SOLN: INTRAVENOUS | Status: DC

## 2021-01-21 MED ORDER — ASPIRIN 81 MG PO CHEW
81.0000 mg | CHEWABLE_TABLET | Freq: Every day | ORAL | Status: DC
  Administered 2021-01-22 – 2021-01-25 (×4): 81 mg via ORAL
  Filled 2021-01-21 (×4): qty 1

## 2021-01-21 MED ORDER — SODIUM CHLORIDE 0.9 % IV SOLN
2.0000 g | Freq: Once | INTRAVENOUS | Status: AC
  Administered 2021-01-21: 2 g via INTRAVENOUS
  Filled 2021-01-21: qty 20

## 2021-01-21 MED ORDER — SPIRONOLACTONE 25 MG PO TABS
25.0000 mg | ORAL_TABLET | Freq: Every day | ORAL | Status: DC
  Administered 2021-01-22: 25 mg via ORAL
  Filled 2021-01-21: qty 1

## 2021-01-21 MED ORDER — ONDANSETRON HCL 4 MG/2ML IJ SOLN: 4.0000 mg | Freq: Four times a day (QID) | INTRAMUSCULAR | Status: DC | PRN

## 2021-01-21 MED ORDER — PANTOPRAZOLE SODIUM 40 MG PO TBEC
40.0000 mg | DELAYED_RELEASE_TABLET | Freq: Every day | ORAL | Status: DC
  Administered 2021-01-22 – 2021-01-25 (×4): 40 mg via ORAL
  Filled 2021-01-21 (×4): qty 1

## 2021-01-21 NOTE — H&P (Addendum)
History and Physical    Shawn [name] MPN:[id number] DOB: [birth year] DOA: 01/21/2021  PCP: [facility] [place]   Patient coming from: [facility] ( SNF)  I have personally briefly reviewed patient's old medical records in [place]  Chief Complaint: Altered mental status.  HPI: Shawn [name] is a 66 y.o. male with PMH significant for essential hypertension, nonischemic cardiomyopathy, polysubstance abuse, history of stroke, with no residual deficits, chronic combined systolic and diastolic heart failure, sent from [facility] with altered mentation.  Patient was found to be confused since morning, he was found to have blood sugar of 65 and was given glucagon, his blood sugar has improved but he still continued to remain confused.  Nursing home staff denied any falls or trauma, head injury, nausea, vomiting, diarrhea. Patient reports having increased urinary discomfort, pain and burning, denies any sick contact or fever or chills.  ED Course: He was hemodynamically stable except hypotension. HR 71, RR 16, BP 100/72, temp 98.5, SPO2 100% on room air Labs include: Sodium 130, potassium 4.3, chloride 93, bicarb 26, glucose 144, BUN 38, creatinine 1.04, calcium 8.6, anion gap 11, alkaline phosphatase 214, albumin 2.8, AST 39, ALT 40, total protein 6.7, lactic acid 2.3, procalcitonin 0.66, WBC 8.5, hemoglobin 12.6, hematocrit 38.1, MCV 96.0, platelet 506, PT 13.4, INR 1.0, APTT 31, TSH 5.4, COVID-negative, influenza negative, UA hemoglobin moderate, nitrites negative, leukocytes negative. CT head: Chronic changes with no acute intracranial process identified. Chest x-ray no acute abnormality noted.  Review of Systems:  Review of Systems  Constitutional: Negative.   HENT: Negative.    Eyes: Negative.   Respiratory: Negative.    Cardiovascular: Negative.   Gastrointestinal: Negative.   Genitourinary:  Positive for frequency and urgency.  Musculoskeletal: Negative.    Skin: Negative.   Neurological:        Confusion/altered mentation  Endo/Heme/Allergies: Negative.   Psychiatric/Behavioral: Negative.      Past Medical History:  Diagnosis Date   Chronic combined systolic (congestive) and diastolic (congestive) heart failure ([place])    a. TTE 6/15: EF < 20%, mildly dilated LV, DD, mildly dilated LA, mod dilated RA, mild MR, mild to mod TR, mod increased posterior wall thickness, elevated LA and LVEDP; b. 4/12019 Echo: EF 20-25%, diff HK. Gr1 DD, nl RV fxn.   CKD (chronic kidney disease), stage II    Diabetes mellitus with complication ([place])    a. Prior admissions w/ DKA (last 12/2017).   Essential hypertension    NICM (nonischemic cardiomyopathy) ([place])    a. 06/2013 Echo: EF<20%; b. 06/2013 Cath: no signif dzs; c. 04/2017 Echo: 20-25%, gr1 DD.; d.05/2018 Echo: 25-30%   Polysubstance abuse ([place])    a. etoh and tobacco   Stroke [facility])     Past Surgical History:  Procedure Laterality Date   NO PAST SURGERIES       reports that he has been smoking cigarettes. He has been smoking an average of .5 packs per day. He has never used smokeless tobacco. He reports that he does not currently use alcohol. No history on file for drug use.  Allergies  Allergen Reactions   Pravastatin     Other reaction(s): Muscle pain   Simvastatin     Other reaction(s): Muscle pain    Family History  Problem Relation Age of Onset   Congestive Heart Failure Mother    Diabetes Mother    Congestive Heart Failure Brother    Family history reviewed and not pertinent.  Prior to Admission medications   Medication Sig Start Date End Date Taking? Authorizing Provider  acetaminophen (TYLENOL) 325 MG tablet Take 2 tablets (650 mg total) by mouth every 6 (six) hours as needed for mild pain (or Fever >/= 101). 01/09/21  Yes Barb Merino, MD  aspirin 81 MG chewable tablet Chew 1 tablet (81 mg total) by mouth daily. 04/30/17  Yes Demetrios Loll, MD  B-Complex-C TABS Take 1 tablet by mouth  daily.   Yes [provider]  carvedilol (COREG) 6.25 MG tablet Take 6.25 mg by mouth 2 (two) times daily with a meal.   Yes [provider]  Cholecalciferol (VITAMIN D3) 1.25 MG (50000 UT) TABS Take 1 tablet by mouth once a week. Mon   Yes [provider]  dapagliflozin propanediol (FARXIGA) 10 MG TABS tablet Take 1 tablet (10 mg total) by mouth daily before breakfast. MUST KEEP APPT FOR FURTHER REFILLS 03/21/20  Yes Alisa Graff, FNP  ENTRESTO 24-26 MG TAKE 1 TABLET BY MOUTH TWICE A DAY 09/14/19  Yes Hackney, Tina A, FNP  feeding supplement, GLUCERNA SHAKE, (GLUCERNA SHAKE) LIQD Take 237 mLs by mouth 3 (three) times daily between meals. 01/09/21  Yes Barb Merino, MD  insulin aspart (NOVOLOG) 100 UNIT/ML injection Inject 0-15 Units into the skin 3 (three) times daily with meals. 01/09/21  Yes Ghimire, Dante Gang, MD  insulin detemir (LEVEMIR) 100 UNIT/ML injection Inject 0.15 mLs (15 Units total) into the skin daily. Patient taking differently: Inject 15 Units into the skin at bedtime. 01/10/21  Yes Barb Merino, MD  Multiple Vitamin (MULTIVITAMIN WITH MINERALS) TABS tablet Take 1 tablet by mouth daily. 01/10/21  Yes Barb Merino, MD  omeprazole (PRILOSEC) 20 MG capsule Take 20 mg by mouth daily. 12/18/18  Yes [provider]  potassium chloride SA (KLOR-CON M) 20 MEQ tablet Take 20 mEq by mouth daily.   Yes [provider]  spironolactone (ALDACTONE) 25 MG tablet Take 25 mg by mouth daily. 03/04/20  Yes [provider]  torsemide (DEMADEX) 20 MG tablet Take 1 tablet (20 mg total) by mouth daily. 01/09/21 02/08/21 Yes Barb Merino, MD    Physical Exam: Vitals:   01/21/21 1330 01/21/21 1400 01/21/21 1448 01/21/21 1449  BP: 104/71 101/73  100/72  Pulse:   69 71  Resp:   18 16  Temp:   98.5 F (36.9 C) 98.5 F (36.9 C)  TempSrc:   Axillary   SpO2:   100% 100%  Weight:      Height:        Constitutional: Appears comfortable, deconditioned, not  in any acute distress. Vitals:   01/21/21 1330 01/21/21 1400 01/21/21 1448 01/21/21 1449  BP: 104/71 101/73  100/72  Pulse:   69 71  Resp:   18 16  Temp:   98.5 F (36.9 C) 98.5 F (36.9 C)  TempSrc:   Axillary   SpO2:   100% 100%  Weight:      Height:       Eyes: PERRL, lids and conjunctivae normal ENMT: Mucous membranes are moist.  Posterior pharynx without exudate..Normal dentition.  Neck: normal, supple, no masses, no thyromegaly Respiratory: Clear to auscultation bilaterally, no wheezing, no crackles.  No accessory muscle use. Cardiovascular: S1-S2 heard.  Regular rate and rhythm, no murmur. Abdomen: Abdomen is soft, nontender, nondistended, BS+ Musculoskeletal:  Good ROM, no contractures. Normal muscle tone.  Skin: no rashes, lesions, ulcers. No induration Neurologic: CN 2-12 grossly intact. Sensation intact, DTR normal. Strength 5/5  in all 4.  Psychiatric: Normal judgment and insight. Alert and oriented x 3. Normal mood.     Labs on Admission: I have personally reviewed following labs and imaging studies  CBC: Recent Labs  Lab 01/21/21 0921  WBC 8.5  NEUTROABS 7.3  HGB 12.6*  HCT 38.1*  MCV 96.0  PLT 638*   Basic Metabolic Panel: Recent Labs  Lab 01/21/21 0921  NA 130*  K 4.3  CL 93*  CO2 26  GLUCOSE 144*  BUN 38*  CREATININE 1.04  CALCIUM 8.6*   GFR: Estimated Creatinine Clearance: 67.1 mL/min (by C-G formula based on SCr of 1.04 mg/dL). Liver Function Tests: Recent Labs  Lab 01/21/21 0921  AST 39  ALT 40  ALKPHOS 214*  BILITOT 0.7  PROT 6.7  ALBUMIN 2.8*   No results for input(s): LIPASE, AMYLASE in the last 168 hours. No results for input(s): AMMONIA in the last 168 hours. Coagulation Profile: Recent Labs  Lab 01/21/21 0921  INR 1.0   Cardiac Enzymes: No results for input(s): CKTOTAL, CKMB, CKMBINDEX, TROPONINI in the last 168 hours. BNP (last 3 results) No results for input(s): PROBNP in the last 8760 hours. HbA1C: No results  for input(s): HGBA1C in the last 72 hours. CBG: Recent Labs  Lab 01/21/21 0830  GLUCAP 149*   Lipid Profile: No results for input(s): CHOL, HDL, LDLCALC, TRIG, CHOLHDL, LDLDIRECT in the last 72 hours. Thyroid Function Tests: Recent Labs    01/21/21 0921  TSH 5.451*   Anemia Panel: No results for input(s): VITAMINB12, FOLATE, FERRITIN, TIBC, IRON, RETICCTPCT in the last 72 hours. Urine analysis:    Component Value Date/Time   COLORURINE YELLOW 01/21/2021 1020   APPEARANCEUR CLEAR 01/21/2021 1020   LABSPEC 1.015 01/21/2021 1020   PHURINE 7.0 01/21/2021 1020   GLUCOSEU >=500 (A) 01/21/2021 1020   HGBUR MODERATE (A) 01/21/2021 1020   BILIRUBINUR NEGATIVE 01/21/2021 1020   KETONESUR NEGATIVE 01/21/2021 1020   PROTEINUR 100 (A) 01/21/2021 1020   NITRITE NEGATIVE 01/21/2021 1020   LEUKOCYTESUR NEGATIVE 01/21/2021 1020    Radiological Exams on Admission: CT Head Wo Contrast  Result Date: 01/21/2021 CLINICAL DATA:  Dizziness EXAM: CT HEAD WITHOUT CONTRAST TECHNIQUE: Contiguous axial images were obtained from the base of the skull through the vertex without intravenous contrast. RADIATION DOSE REDUCTION: This exam was performed according to the departmental dose-optimization program which includes automated exposure control, adjustment of the mA and/or kV according to patient size and/or use of iterative reconstruction technique. COMPARISON:  CT head 01/05/2021 FINDINGS: Brain: No acute intracranial hemorrhage, mass effect, or herniation. No extra-axial fluid collections. No evidence of acute territorial infarct. No hydrocephalus. Mild cortical volume loss. Mild patchy hypodensities in the periventricular and subcortical white matter, likely secondary to chronic microvascular ischemic changes. Vascular: No hyperdense vessel or unexpected calcification. Skull: Normal. Negative for fracture or focal lesion. Sinuses/Orbits: No acute finding. Other: None. IMPRESSION: Chronic changes with no  acute intracranial process identified. Electronically Signed   By: Ofilia Neas M.D.   On: 01/21/2021 09:17   DG Chest Port 1 View  Result Date: 01/21/2021 CLINICAL DATA:  Concern for sepsis EXAM: PORTABLE CHEST 1 VIEW COMPARISON:  Chest x-ray dated January 05, 2021 FINDINGS: Cardiac and mediastinal contours are unchanged. Mild interstitial opacities, likely due to underlying emphysema. No focal consolidation. No large pleural effusion or pneumothorax. IMPRESSION: No acute lung opacity. Electronically Signed   By: Yetta Glassman M.D.   On: 01/21/2021 09:35    EKG: Independently reviewed.  Normal sinus rhythm, no ST-T wave changes.  Assessment/Plan Principal Problem:   Acute metabolic encephalopathy Active Problems:   NICM (nonischemic cardiomyopathy) ([name])   Type 2 diabetes mellitus with stage 3b chronic kidney disease, with long-term current use of insulin ([name])   Polysubstance abuse ([name])   Alcohol abuse   Chronic systolic CHF (congestive heart failure) ([name])   Essential hypertension   GERD (gastroesophageal reflux disease)   HLD (hyperlipidemia)    Acute metabolic encephalopathy: Suspect multifactorial, could be hyponatremia, hypoglycemia, UTI, dehydration. Patient was found hypoglycemic, given glucagon,  blood sugar has improved. Patient reported increased urinary frequency and burning, continue IV ceftriaxone. UA is unremarkable.  Follow-up urine culture.  Continue IV hydration. CT head: No acute abnormality noted.  Chronic microvascular changes  Hyponatremia: Could be secondary to dehydration. Continue gentle NS hydration. Continue to monitor serum sodium  Diabetes mellitus with hypoglycemia : Hold insulin for now. Continue to monitor fingersticks. Blood sugar has improved and remains controlled.  Essential hypertension: Continue carvedilol. Hold Entresto, spironolactone and torsemide given hypotension.  CKD stage IIIa: Serum creatinine at baseline.   Avoid  nephrotoxic medications.  Dehydration: Resolved with IV hydration.  UTI: UA unremarkable but patient reports significant burning and frequency. Continue ceftriaxone for 3 days, follow-up urine culture  Chronic combined systolic and diastolic heart failure: Last echocardiogram LVEF 25 to 30%. Resume Entresto, spironolactone, Farxiga and torsemide when blood pressure improves. Obtain repeat echocardiogram.  Lactic acidosis: Could be secondary to dehydration, resolved with IV hydration. Sepsis ruled out.  DVT prophylaxis: Lovenox Code Status: Full code. Family Communication: No family at bed side. Disposition Plan:   Consults called:  None Admission status:Observation   Shawna Clamp MD [facility]  If 7PM-7AM, please contact night-coverage   01/21/2021, 4:25 PM

## 2021-01-21 NOTE — ED Provider Notes (Signed)
[facility] Provider Note   CSN: [id number] Arrival date & time: 01/21/21  0815     History  Chief Complaint  Patient presents with   Altered Mental Status    Shawn [name] is a 66 y.o. male.  Patient has a history of diabetes and a stroke.  This morning the patient was altered and his glucose was low at the nursing home.  They gave him an IM dose of glucose that seemed to help.  Patient is still confused in the emergency department but seems to be getting better.  The history is provided by the patient, the nursing home and medical records.  Altered Mental Status Presenting symptoms: behavior changes   Severity:  Moderate Most recent episode:  Today Episode history:  Continuous Timing:  Constant Progression:  Waxing and waning Chronicity:  New Context: alcohol use   Associated symptoms: no abdominal pain       Home Medications Prior to Admission medications   Medication Sig Start Date End Date Taking? Authorizing Provider  acetaminophen (TYLENOL) 325 MG tablet Take 2 tablets (650 mg total) by mouth every 6 (six) hours as needed for mild pain (or Fever >/= 101). 01/09/21  Yes Barb Merino, MD  aspirin 81 MG chewable tablet Chew 1 tablet (81 mg total) by mouth daily. 04/30/17  Yes Demetrios Loll, MD  B-Complex-C TABS Take 1 tablet by mouth daily.   Yes [provider]  carvedilol (COREG) 6.25 MG tablet Take 6.25 mg by mouth 2 (two) times daily with a meal.   Yes [provider]  Cholecalciferol (VITAMIN D3) 1.25 MG (50000 UT) TABS Take 1 tablet by mouth once a week. Mon   Yes [provider]  dapagliflozin propanediol (FARXIGA) 10 MG TABS tablet Take 1 tablet (10 mg total) by mouth daily before breakfast. MUST KEEP APPT FOR FURTHER REFILLS 03/21/20  Yes Alisa Graff, FNP  ENTRESTO 24-26 MG TAKE 1 TABLET BY MOUTH TWICE A DAY 09/14/19  Yes Hackney, Tina A, FNP  feeding supplement, GLUCERNA SHAKE, (GLUCERNA SHAKE) LIQD Take 237  mLs by mouth 3 (three) times daily between meals. 01/09/21  Yes Barb Merino, MD  insulin aspart (NOVOLOG) 100 UNIT/ML injection Inject 0-15 Units into the skin 3 (three) times daily with meals. 01/09/21  Yes Ghimire, Dante Gang, MD  insulin detemir (LEVEMIR) 100 UNIT/ML injection Inject 0.15 mLs (15 Units total) into the skin daily. Patient taking differently: Inject 15 Units into the skin at bedtime. 01/10/21  Yes Barb Merino, MD  Multiple Vitamin (MULTIVITAMIN WITH MINERALS) TABS tablet Take 1 tablet by mouth daily. 01/10/21  Yes Barb Merino, MD  omeprazole (PRILOSEC) 20 MG capsule Take 20 mg by mouth daily. 12/18/18  Yes [provider]  potassium chloride SA (KLOR-CON M) 20 MEQ tablet Take 20 mEq by mouth daily.   Yes [provider]  spironolactone (ALDACTONE) 25 MG tablet Take 25 mg by mouth daily. 03/04/20  Yes [provider]  torsemide (DEMADEX) 20 MG tablet Take 1 tablet (20 mg total) by mouth daily. 01/09/21 02/08/21 Yes Barb Merino, MD      Allergies    Pravastatin and Simvastatin    Review of Systems   Review of Systems  Unable to perform ROS: Mental status change  Gastrointestinal:  Negative for abdominal pain.   Physical Exam Updated Vital Signs BP 97/71    Pulse 67    Temp 98.4 F (36.9 C) (Rectal)    Resp 15    Ht  5\' 7"  (1.702 m)    Wt 68 kg    SpO2 99%    BMI 23.49 kg/m  Physical Exam Vitals and nursing note reviewed.  Constitutional:      Appearance: He is well-developed.  HENT:     Head: Normocephalic.     Nose: Nose normal.  Eyes:     General: No scleral icterus.    Conjunctiva/sclera: Conjunctivae normal.  Neck:     Thyroid: No thyromegaly.  Cardiovascular:     Rate and Rhythm: Normal rate and regular rhythm.     Heart sounds: No murmur heard.   No friction rub. No gallop.  Pulmonary:     Breath sounds: No stridor. No wheezing or rales.  Chest:     Chest wall: No tenderness.  Abdominal:     General: There is no distension.      Tenderness: There is no abdominal tenderness. There is no rebound.  Musculoskeletal:        General: Normal range of motion.     Cervical back: Neck supple.  Lymphadenopathy:     Cervical: No cervical adenopathy.  Skin:    Findings: No erythema or rash.  Neurological:     Mental Status: He is alert.     Motor: No abnormal muscle tone.     Coordination: Coordination normal.     Comments: Oriented to person but confused  Psychiatric:        Behavior: Behavior normal.    ED Results / Procedures / Treatments   Labs (all labs ordered are listed, but only abnormal results are displayed) Labs Reviewed  LACTIC ACID, PLASMA - Abnormal; Notable for the following components:      Result Value   Lactic Acid, Venous 2.3 (*)    All other components within normal limits  COMPREHENSIVE METABOLIC PANEL - Abnormal; Notable for the following components:   Sodium 130 (*)    Chloride 93 (*)    Glucose, Bld 144 (*)    BUN 38 (*)    Calcium 8.6 (*)    Albumin 2.8 (*)    Alkaline Phosphatase 214 (*)    All other components within normal limits  CBC WITH DIFFERENTIAL/PLATELET - Abnormal; Notable for the following components:   RBC 3.97 (*)    Hemoglobin 12.6 (*)    HCT 38.1 (*)    Platelets 506 (*)    All other components within normal limits  URINALYSIS, ROUTINE W REFLEX MICROSCOPIC - Abnormal; Notable for the following components:   Glucose, UA >=500 (*)    Hgb urine dipstick MODERATE (*)    Protein, ur 100 (*)    All other components within normal limits  URINALYSIS, MICROSCOPIC (REFLEX) - Abnormal; Notable for the following components:   Bacteria, UA RARE (*)    Non Squamous Epithelial PRESENT (*)    All other components within normal limits  CBG MONITORING, ED - Abnormal; Notable for the following components:   Glucose-Capillary 149 (*)    All other components within normal limits  RESP PANEL BY RT-PCR (FLU A&B, COVID) ARPGX2  CULTURE, BLOOD (ROUTINE X 2)  CULTURE, BLOOD (ROUTINE X  2)  URINE CULTURE  LACTIC ACID, PLASMA  PROTIME-INR  APTT  RAPID URINE DRUG SCREEN, HOSP PERFORMED  HIV ANTIBODY (ROUTINE TESTING W REFLEX)  CBC  CREATININE, SERUM  TSH  PROCALCITONIN  I-STAT CHEM 8, ED    EKG EKG Interpretation  Date/Time:  Wednesday January 21 2021 08:39:51 EST Ventricular Rate:  75 PR Interval:  191 QRS Duration: 116 QT Interval:  431 QTC Calculation: 482 R Axis:   68 Text Interpretation: Sinus rhythm Nonspecific intraventricular conduction delay Abnormal T, consider ischemia, diffuse leads Baseline wander in lead(s) V3 V5 V6 Confirmed by Milton Ferguson [phone]) on 01/21/2021 8:48:12 AM  Radiology CT Head Wo Contrast  Result Date: 01/21/2021 CLINICAL DATA:  Dizziness EXAM: CT HEAD WITHOUT CONTRAST TECHNIQUE: Contiguous axial images were obtained from the base of the skull through the vertex without intravenous contrast. RADIATION DOSE REDUCTION: This exam was performed according to the departmental dose-optimization program which includes automated exposure control, adjustment of the mA and/or kV according to patient size and/or use of iterative reconstruction technique. COMPARISON:  CT head 01/05/2021 FINDINGS: Brain: No acute intracranial hemorrhage, mass effect, or herniation. No extra-axial fluid collections. No evidence of acute territorial infarct. No hydrocephalus. Mild cortical volume loss. Mild patchy hypodensities in the periventricular and subcortical white matter, likely secondary to chronic microvascular ischemic changes. Vascular: No hyperdense vessel or unexpected calcification. Skull: Normal. Negative for fracture or focal lesion. Sinuses/Orbits: No acute finding. Other: None. IMPRESSION: Chronic changes with no acute intracranial process identified. Electronically Signed   By: Ofilia Neas M.D.   On: 01/21/2021 09:17   DG Chest Port 1 View  Result Date: 01/21/2021 CLINICAL DATA:  Concern for sepsis EXAM: PORTABLE CHEST 1 VIEW COMPARISON:  Chest  x-ray dated January 05, 2021 FINDINGS: Cardiac and mediastinal contours are unchanged. Mild interstitial opacities, likely due to underlying emphysema. No focal consolidation. No large pleural effusion or pneumothorax. IMPRESSION: No acute lung opacity. Electronically Signed   By: Yetta Glassman M.D.   On: 01/21/2021 09:35    Procedures Procedures    Medications Ordered in ED Medications  acetaminophen (TYLENOL) tablet 650 mg (has no administration in time range)  aspirin chewable tablet 81 mg (has no administration in time range)  carvedilol (COREG) tablet 6.25 mg (has no administration in time range)  spironolactone (ALDACTONE) tablet 25 mg (has no administration in time range)  pantoprazole (PROTONIX) EC tablet 40 mg (has no administration in time range)  enoxaparin (LOVENOX) injection 40 mg (has no administration in time range)  docusate sodium (COLACE) capsule 100 mg (has no administration in time range)  ondansetron (ZOFRAN) tablet 4 mg (has no administration in time range)    Or  ondansetron (ZOFRAN) injection 4 mg (has no administration in time range)  cefTRIAXone (ROCEPHIN) 2 g in sodium chloride 0.9 % 100 mL IVPB (0 g Intravenous Stopped 01/21/21 1036)  sodium chloride 0.9 % bolus 1,000 mL (0 mLs Intravenous Stopped 01/21/21 1036)  sodium chloride 0.9 % bolus 1,000 mL (1,000 mLs Intravenous New Bag/Given 01/21/21 1122)    ED Course/ Medical Decision Making/ A&P                           Medical Decision Making Amount and/or Complexity of Data Reviewed Labs: ordered. Radiology: ordered. ECG/medicine tests: ordered.  Risk Decision regarding hospitalization.   Altered mental status with dehydration and hypoglycemia.  Patient will be admitted to medicine    This patient presents to the ED for concern of altered mental status, this involves an extensive number of treatment options, and is a complaint that carries with it a high risk of complications and morbidity.  The  differential diagnosis includes hypoglycemia, stroke, dehydration   Co morbidities that complicate the patient evaluation  Diabetes and stroke   Additional history obtained:  Additional history obtained from  nursing home External records from outside source obtained and reviewed including hospital records   Lab Tests:  I Ordered, and personally interpreted labs.  The pertinent results include: CBC shows low hemoglobin 12.4, chemistries show mild AKI and low sodium, glucose 144   Imaging Studies ordered:  I ordered imaging studies including CT head I independently visualized and interpreted imaging which showed no acute disease I agree with the radiologist interpretation   Cardiac Monitoring:  The patient was maintained on a cardiac monitor.  I personally viewed and interpreted the cardiac monitored which showed an underlying rhythm of: Normal sinus rhythm   Medicines ordered and prescription drug management:  I ordered medication including normal saline for dehydration Reevaluation of the patient after these medicines showed that the patient improved I have reviewed the patients home medicines and have made adjustments as needed   Test Considered:  MRI   Critical Interventions:  No available   Consultations Obtained:  I requested consultation with the hospital,  and discussed lab and imaging findings as well as pertinent plan - they recommend: It   Problem List / ED Course:  Altered mental status and dehydration and hypoglycemia episode   Reevaluation:  After the interventions noted above, I reevaluated the patient and found that they have :improved   Social Determinants of Health:  Lives in nursing   Dispostion:  After consideration of the diagnostic results and the patients response to treatment, I feel that the patent would benefit from admission for observation and treatment of altered mental status dehydration.         Final Clinical  Impression(s) / ED Diagnoses Final diagnoses:  Confusion  Hypoglycemia    Rx / DC Orders ED Discharge Orders     None         Milton Ferguson, MD 01/22/21 1213

## 2021-01-21 NOTE — ED Triage Notes (Signed)
Patient brought in via EMS from [facility]. Per facility patient is "far from his baseline". Patient had hypoglycemic episode this morning where they gave IM glucose. Patient alert at this time, does not seem to want to be bothered

## 2021-01-21 NOTE — Progress Notes (Signed)
Pt arrived to unit 300 at 1500 via bed by ED staff. Pt has periods of confusion but is aware of where he is. Pt c/o eye pain. MD made aware. No new orders at this time. Pt resting comfortably with bed in lowest position and call light  and bedside table within reach.

## 2021-01-21 NOTE — Progress Notes (Signed)
Pt c/o chest pain that comes and goes. MD made aware. EKG ordered.

## 2021-01-21 NOTE — Sepsis Progress Note (Signed)
eLink is monitoring this Code Sepsis. °

## 2021-01-22 ENCOUNTER — Other Ambulatory Visit (HOSPITAL_COMMUNITY): Payer: Self-pay | Admitting: *Deleted

## 2021-01-22 ENCOUNTER — Observation Stay (HOSPITAL_COMMUNITY): Payer: No Typology Code available for payment source

## 2021-01-22 DIAGNOSIS — E860 Dehydration: Secondary | ICD-10-CM | POA: Diagnosis present

## 2021-01-22 DIAGNOSIS — E11649 Type 2 diabetes mellitus with hypoglycemia without coma: Secondary | ICD-10-CM | POA: Diagnosis present

## 2021-01-22 DIAGNOSIS — E785 Hyperlipidemia, unspecified: Secondary | ICD-10-CM | POA: Diagnosis present

## 2021-01-22 DIAGNOSIS — Z8673 Personal history of transient ischemic attack (TIA), and cerebral infarction without residual deficits: Secondary | ICD-10-CM | POA: Diagnosis not present

## 2021-01-22 DIAGNOSIS — Z79899 Other long term (current) drug therapy: Secondary | ICD-10-CM | POA: Diagnosis not present

## 2021-01-22 DIAGNOSIS — R079 Chest pain, unspecified: Secondary | ICD-10-CM | POA: Diagnosis not present

## 2021-01-22 DIAGNOSIS — I130 Hypertensive heart and chronic kidney disease with heart failure and stage 1 through stage 4 chronic kidney disease, or unspecified chronic kidney disease: Secondary | ICD-10-CM | POA: Diagnosis present

## 2021-01-22 DIAGNOSIS — G9341 Metabolic encephalopathy: Secondary | ICD-10-CM | POA: Diagnosis present

## 2021-01-22 DIAGNOSIS — N1832 Chronic kidney disease, stage 3b: Secondary | ICD-10-CM | POA: Diagnosis present

## 2021-01-22 DIAGNOSIS — N179 Acute kidney failure, unspecified: Secondary | ICD-10-CM | POA: Diagnosis present

## 2021-01-22 DIAGNOSIS — I5022 Chronic systolic (congestive) heart failure: Secondary | ICD-10-CM | POA: Diagnosis not present

## 2021-01-22 DIAGNOSIS — F1910 Other psychoactive substance abuse, uncomplicated: Secondary | ICD-10-CM | POA: Diagnosis present

## 2021-01-22 DIAGNOSIS — Z7982 Long term (current) use of aspirin: Secondary | ICD-10-CM | POA: Diagnosis not present

## 2021-01-22 DIAGNOSIS — E1122 Type 2 diabetes mellitus with diabetic chronic kidney disease: Secondary | ICD-10-CM | POA: Diagnosis present

## 2021-01-22 DIAGNOSIS — E872 Acidosis, unspecified: Secondary | ICD-10-CM | POA: Diagnosis present

## 2021-01-22 DIAGNOSIS — Z20822 Contact with and (suspected) exposure to covid-19: Secondary | ICD-10-CM | POA: Diagnosis present

## 2021-01-22 DIAGNOSIS — F1010 Alcohol abuse, uncomplicated: Secondary | ICD-10-CM | POA: Diagnosis present

## 2021-01-22 DIAGNOSIS — Z833 Family history of diabetes mellitus: Secondary | ICD-10-CM | POA: Diagnosis not present

## 2021-01-22 DIAGNOSIS — N390 Urinary tract infection, site not specified: Secondary | ICD-10-CM | POA: Diagnosis present

## 2021-01-22 DIAGNOSIS — I428 Other cardiomyopathies: Secondary | ICD-10-CM | POA: Diagnosis present

## 2021-01-22 DIAGNOSIS — I5042 Chronic combined systolic (congestive) and diastolic (congestive) heart failure: Secondary | ICD-10-CM | POA: Diagnosis present

## 2021-01-22 DIAGNOSIS — Z888 Allergy status to other drugs, medicaments and biological substances status: Secondary | ICD-10-CM | POA: Diagnosis not present

## 2021-01-22 DIAGNOSIS — R410 Disorientation, unspecified: Secondary | ICD-10-CM | POA: Diagnosis present

## 2021-01-22 DIAGNOSIS — E871 Hypo-osmolality and hyponatremia: Secondary | ICD-10-CM | POA: Diagnosis present

## 2021-01-22 DIAGNOSIS — Z8249 Family history of ischemic heart disease and other diseases of the circulatory system: Secondary | ICD-10-CM | POA: Diagnosis not present

## 2021-01-22 DIAGNOSIS — K219 Gastro-esophageal reflux disease without esophagitis: Secondary | ICD-10-CM | POA: Diagnosis present

## 2021-01-22 DIAGNOSIS — Z23 Encounter for immunization: Secondary | ICD-10-CM | POA: Diagnosis not present

## 2021-01-22 LAB — COMPREHENSIVE METABOLIC PANEL
ALT: 37 U/L (ref 0–44)
AST: 53 U/L — ABNORMAL HIGH (ref 15–41)
Albumin: 2.4 g/dL — ABNORMAL LOW (ref 3.5–5.0)
Alkaline Phosphatase: 194 U/L — ABNORMAL HIGH (ref 38–126)
Anion gap: 12 (ref 5–15)
BUN: 39 mg/dL — ABNORMAL HIGH (ref 8–23)
CO2: 24 mmol/L (ref 22–32)
Calcium: 8.2 mg/dL — ABNORMAL LOW (ref 8.9–10.3)
Chloride: 95 mmol/L — ABNORMAL LOW (ref 98–111)
Creatinine, Ser: 1.09 mg/dL (ref 0.61–1.24)
GFR, Estimated: >60 mL/min (ref >60)
Glucose, Bld: 336 mg/dL — ABNORMAL HIGH (ref 70–99)
Potassium: 5 mmol/L (ref 3.5–5.1)
Sodium: 131 mmol/L — ABNORMAL LOW (ref 135–145)
Total Bilirubin: 1 mg/dL (ref 0.3–1.2)
Total Protein: 5.7 g/dL — ABNORMAL LOW (ref 6.5–8.1)

## 2021-01-22 LAB — ECHOCARDIOGRAM COMPLETE
AR max vel: 2.76 cm2
AV Area VTI: 2.47 cm2
AV Area mean vel: 2.54 cm2
AV Mean grad: 3 mmHg
AV Peak grad: 5.3 mmHg
Ao pk vel: 1.15 m/s
Area-P 1/2: 5.58 cm2
Calc EF: 45 %
Height: 67 in
MV VTI: 2.62 cm2
S' Lateral: 3.8 cm
Single Plane A2C EF: 52 %
Single Plane A4C EF: 44.3 %
Weight: 2400 oz

## 2021-01-22 LAB — GLUCOSE, CAPILLARY
Glucose-Capillary: 516 mg/dL (ref 70–99)
Glucose-Capillary: 559 mg/dL (ref 70–99)

## 2021-01-22 LAB — CBC
HCT: 33.5 % — ABNORMAL LOW (ref 39.0–52.0)
Hemoglobin: 11.1 g/dL — ABNORMAL LOW (ref 13.0–17.0)
MCH: 31.7 pg (ref 26.0–34.0)
MCHC: 33.1 g/dL (ref 30.0–36.0)
MCV: 95.7 fL (ref 80.0–100.0)
Platelets: 472 10*3/uL — ABNORMAL HIGH (ref 150–400)
RBC: 3.5 MIL/uL — ABNORMAL LOW (ref 4.22–5.81)
RDW: 14.4 % (ref 11.5–15.5)
WBC: 6.1 10*3/uL (ref 4.0–10.5)
nRBC: 0 % (ref 0.0–0.2)

## 2021-01-22 LAB — URINE CULTURE: Culture: NO GROWTH

## 2021-01-22 MED ORDER — INSULIN ASPART 100 UNIT/ML IJ SOLN: 0.0000 [IU] | Freq: Three times a day (TID) | INTRAMUSCULAR | Status: DC

## 2021-01-22 MED ORDER — SODIUM CHLORIDE 0.9 % IV SOLN: INTRAVENOUS | Status: AC

## 2021-01-22 MED ORDER — INSULIN ASPART 100 UNIT/ML IJ SOLN
12.0000 [IU] | Freq: Once | INTRAMUSCULAR | Status: AC
  Administered 2021-01-22: 12 [IU] via SUBCUTANEOUS

## 2021-01-22 MED ORDER — TORSEMIDE 20 MG PO TABS
20.0000 mg | ORAL_TABLET | Freq: Every day | ORAL | Status: DC
  Administered 2021-01-22: 20 mg via ORAL
  Filled 2021-01-22 (×2): qty 1

## 2021-01-22 NOTE — Progress Notes (Signed)
PROGRESS NOTE    Shawn [name]  DVV:[id number] DOB: 04-08-1955 DOA: 01/21/2021 PCP: [place]   Brief Narrative: This 66 years old male with PMH significant for essential hypertension, nonischemic cardiomyopathy, polysubstance abuse, history of stroke with no residual deficits, chronic combined systolic and diastolic heart failure sent from [facility] with altered mentation.  Patient was found to be confused since morning, he was found to have blood sugar of 65 and was given glucagon,  his blood sugar is improved but he still continued to remain confused.  Nursing home staff denied any falls or trauma,  head injury, nausea, vomiting, diarrhea.  Patient reports having increased urinary discomfort, pain and burning, denies any sick contacts,  fever or chills. Patient is admitted for acute metabolic encephalopathy found to be multifactorial from UTI, hyponatremia and dehydration.   Assessment & Plan:   Principal Problem:   Acute metabolic encephalopathy Active Problems:   NICM (nonischemic cardiomyopathy) ([name])   Type 2 diabetes mellitus with stage 3b chronic kidney disease, with long-term current use of insulin ([name])   Polysubstance abuse ([name])   Alcohol abuse   Chronic systolic CHF (congestive heart failure) ([name])   Essential hypertension   GERD (gastroesophageal reflux disease)   HLD (hyperlipidemia)  Acute metabolic encephalopathy: > Improving Suspect multifactorial, could be from hyponatremia, hypoglycemia, UTI, dehydration. Patient was found hypoglycemic, given glucagon,  blood sugar has improved. Patient reported increased urinary frequency and burning, continue IV ceftriaxone. UA is unremarkable.  Follow-up urine culture.  Continue IV hydration. CT head: No acute abnormality noted.  Chronic microvascular changes.   Hyponatremia: Could be secondary to dehydration. Continue gentle NS hydration. Continue to monitor serum sodium 130>131   Diabetes mellitus  with hypoglycemia : Hold insulin for now. Continue to monitor fingersticks. Blood sugar has improved and remains controlled. Start regular insulin sliding scale.   Essential hypertension: Continue carvedilol. Held [place], spironolactone and torsemide given hypotension. Resume Entresto, spironolactone and torsemide.    CKD stage IIIa: Serum creatinine at baseline.   Avoid nephrotoxic medications.   Dehydration: Resolved with IV hydration.   UTI: UA unremarkable but patient reports significant burning and frequency. Continue ceftriaxone for 3 days, follow-up urine culture   Chronic combined systolic and diastolic heart failure: Last echocardiogram LVEF 25 to 30%. Resume Entresto, spironolactone, Farxiga and torsemide when blood pressure improves. Repeat echocardiogram shows LVEF 40 to 45%.   Lactic acidosis: Could be secondary to dehydration, resolved with IV hydration. Sepsis ruled out.   DVT prophylaxis: Lovenox. Code Status: Full code. Family Communication: No family at bed side. Disposition Plan:   Status is: Observation  The patient remains OBS appropriate and will d/c before 2 midnights.   Admitted for altered mental status, found to have UTI requiring IV antibiotics.  PT and OT evaluation pending.   Consultants:   None  Procedures: None  Antimicrobials:   Anti-infectives (From admission, onward)    Start     Dose/Rate Route Frequency Ordered Stop   01/22/21 1000  cefTRIAXone (ROCEPHIN) 1 g in sodium chloride 0.9 % 100 mL IVPB        1 g 200 mL/hr over 30 Minutes Intravenous Every 24 hours 01/21/21 1234 01/25/21 0959   01/21/21 0900  cefTRIAXone (ROCEPHIN) 2 g in sodium chloride 0.9 % 100 mL IVPB        2 g 200 mL/hr over 30 Minutes Intravenous  Once 01/21/21 0845 01/21/21 1036        Subjective: Patient was seen  and examined at bedside.  Overnight events noted.  Patient reports feeling better.  He still feels weak and tired.  Denies any chest pain  or shortness of breath.  Objective: Vitals:   01/21/21 1449 01/21/21 2009 01/22/21 0510 01/22/21 1315  BP: 100/72 98/69 100/65 120/72  Pulse: 71 76 75 75  Resp: 16 20 20 16   Temp: 98.5 F (36.9 C) 98.2 F (36.8 C) 98 F (36.7 C) 98.5 F (36.9 C)  TempSrc:    Oral  SpO2: 100% 97% 95% 100%  Weight:      Height:        Intake/Output Summary (Last 24 hours) at 01/22/2021 1347 Last data filed at 01/22/2021 0900 Gross per 24 hour  Intake 240 ml  Output 1000 ml  Net -760 ml   Filed Weights   01/21/21 0823  Weight: 68 kg    Examination:  General exam: Appears comfortable, not in any acute distress.  Deconditioned Respiratory system: Clear to auscultation bilaterally, respiratory effort normal, RR 15 Cardiovascular system: S1-S2 heard, regular rate and rhythm, no murmur. Gastrointestinal system: Abdomen is soft, nontender, nondistended, BS+ Central nervous system: Alert and oriented x 2. No focal neurological deficits. Extremities: No edema, no cyanosis, no clubbing. Skin: No rashes, lesions or ulcers Psychiatry: Judgement and insight appear normal. Mood & affect appropriate.     Data Reviewed: I have personally reviewed following labs and imaging studies  CBC: Recent Labs  Lab 01/21/21 0921 01/22/21 0613  WBC 8.5 6.1  NEUTROABS 7.3  --   HGB 12.6* 11.1*  HCT 38.1* 33.5*  MCV 96.0 95.7  PLT 506* 324*   Basic Metabolic Panel: Recent Labs  Lab 01/21/21 0921 01/22/21 0613  NA 130* 131*  K 4.3 5.0  CL 93* 95*  CO2 26 24  GLUCOSE 144* 336*  BUN 38* 39*  CREATININE 1.04 1.09  CALCIUM 8.6* 8.2*  MG 2.5*  --   PHOS 2.6  --    GFR: Estimated Creatinine Clearance: 64 mL/min (by C-G formula based on SCr of 1.09 mg/dL). Liver Function Tests: Recent Labs  Lab 01/21/21 0921 01/22/21 0613  AST 39 53*  ALT 40 37  ALKPHOS 214* 194*  BILITOT 0.7 1.0  PROT 6.7 5.7*  ALBUMIN 2.8* 2.4*   No results for input(s): LIPASE, AMYLASE in the last 168 hours. No  results for input(s): AMMONIA in the last 168 hours. Coagulation Profile: Recent Labs  Lab 01/21/21 0921  INR 1.0   Cardiac Enzymes: No results for input(s): CKTOTAL, CKMB, CKMBINDEX, TROPONINI in the last 168 hours. BNP (last 3 results) No results for input(s): PROBNP in the last 8760 hours. HbA1C: No results for input(s): HGBA1C in the last 72 hours. CBG: Recent Labs  Lab 01/21/21 0830  GLUCAP 149*   Lipid Profile: No results for input(s): CHOL, HDL, LDLCALC, TRIG, CHOLHDL, LDLDIRECT in the last 72 hours. Thyroid Function Tests: Recent Labs    01/21/21 0921  TSH 5.451*   Anemia Panel: No results for input(s): VITAMINB12, FOLATE, FERRITIN, TIBC, IRON, RETICCTPCT in the last 72 hours. Sepsis Labs: Recent Labs  Lab 01/21/21 0921 01/21/21 0946 01/21/21 1159  PROCALCITON 0.66  --   --   LATICACIDVEN  --  2.3* 1.4    Recent Results (from the past 240 hour(s))  Resp Panel by RT-PCR (Flu A&B, Covid) Nasopharyngeal Swab     Status: None   Collection Time: 01/21/21  9:19 AM   Specimen: Nasopharyngeal Swab; Nasopharyngeal(NP) swabs in vial transport medium  Result Value Ref Range Status   SARS Coronavirus 2 by RT PCR NEGATIVE NEGATIVE Final    Comment: (NOTE) SARS-CoV-2 target nucleic acids are NOT DETECTED.  The SARS-CoV-2 RNA is generally detectable in upper respiratory specimens during the acute phase of infection. The lowest concentration of SARS-CoV-2 viral copies this assay can detect is 138 copies/mL. A negative result does not preclude SARS-Cov-2 infection and should not be used as the sole basis for treatment or other patient management decisions. A negative result may occur with  improper specimen collection/handling, submission of specimen other than nasopharyngeal swab, presence of viral mutation(s) within the areas targeted by this assay, and inadequate number of viral copies(<138 copies/mL). A negative result must be combined with clinical observations,  patient history, and epidemiological information. The expected result is Negative.  Fact Sheet for Patients:  EntrepreneurPulse.com.au  Fact Sheet for Healthcare Providers:  IncredibleEmployment.be  This test is no t yet approved or cleared by the [place] FDA and  has been authorized for detection and/or diagnosis of SARS-CoV-2 by FDA under an Emergency Use Authorization (EUA). This EUA will remain  in effect (meaning this test can be used) for the duration of the COVID-19 declaration under Section 564(b)(1) of the Act, 21 U.S.C.section 360bbb-3(b)(1), unless the authorization is terminated  or revoked sooner.       Influenza A by PCR NEGATIVE NEGATIVE Final   Influenza B by PCR NEGATIVE NEGATIVE Final    Comment: (NOTE) The Xpert Xpress SARS-CoV-2/FLU/RSV plus assay is intended as an aid in the diagnosis of influenza from Nasopharyngeal swab specimens and should not be used as a sole basis for treatment. Nasal washings and aspirates are unacceptable for Xpert Xpress SARS-CoV-2/FLU/RSV testing.  Fact Sheet for Patients: EntrepreneurPulse.com.au  Fact Sheet for Healthcare Providers: IncredibleEmployment.be  This test is not yet approved or cleared by the [place] FDA and has been authorized for detection and/or diagnosis of SARS-CoV-2 by FDA under an Emergency Use Authorization (EUA). This EUA will remain in effect (meaning this test can be used) for the duration of the COVID-19 declaration under Section 564(b)(1) of the Act, 21 U.S.C. section 360bbb-3(b)(1), unless the authorization is terminated or revoked.  Performed at [facility], [address], [place], [place] [address]   Blood Culture (routine x 2)     Status: None (Preliminary result)   Collection Time: 01/21/21  9:47 AM   Specimen: BLOOD RIGHT WRIST  Result Value Ref Range Status   Specimen Description BLOOD RIGHT WRIST  Final    Special Requests   Final    BOTTLES DRAWN AEROBIC ONLY Blood Culture adequate volume   Culture   Final    NO GROWTH < 24 HOURS Performed at [facility], [address], [place], [place] [address]    Report Status PENDING  Incomplete  Blood Culture (routine x 2)     Status: None (Preliminary result)   Collection Time: 01/21/21  9:47 AM   Specimen: BLOOD LEFT FOREARM  Result Value Ref Range Status   Specimen Description BLOOD LEFT FOREARM  Final   Special Requests   Final    BOTTLES DRAWN AEROBIC AND ANAEROBIC Blood Culture adequate volume   Culture   Final    NO GROWTH < 24 HOURS Performed at [facility], [address] [place], [place] [address]    Report Status PENDING  Incomplete    Radiology Studies: CT Head Wo Contrast  Result Date: 01/21/2021 CLINICAL DATA:  Dizziness EXAM: CT HEAD WITHOUT CONTRAST  TECHNIQUE: Contiguous axial images were obtained from the base of the skull through the vertex without intravenous contrast. RADIATION DOSE REDUCTION: This exam was performed according to the departmental dose-optimization program which includes automated exposure control, adjustment of the mA and/or kV according to patient size and/or use of iterative reconstruction technique. COMPARISON:  CT head 01/05/2021 FINDINGS: Brain: No acute intracranial hemorrhage, mass effect, or herniation. No extra-axial fluid collections. No evidence of acute territorial infarct. No hydrocephalus. Mild cortical volume loss. Mild patchy hypodensities in the periventricular and subcortical white matter, likely secondary to chronic microvascular ischemic changes. Vascular: No hyperdense vessel or unexpected calcification. Skull: Normal. Negative for fracture or focal lesion. Sinuses/Orbits: No acute finding. Other: None. IMPRESSION: Chronic changes with no acute intracranial process identified. Electronically Signed   By: Ofilia Neas M.D.   On: 01/21/2021 09:17   DG Chest Port 1 View  Result Date:  01/21/2021 CLINICAL DATA:  Concern for sepsis EXAM: PORTABLE CHEST 1 VIEW COMPARISON:  Chest x-ray dated January 05, 2021 FINDINGS: Cardiac and mediastinal contours are unchanged. Mild interstitial opacities, likely due to underlying emphysema. No focal consolidation. No large pleural effusion or pneumothorax. IMPRESSION: No acute lung opacity. Electronically Signed   By: Yetta Glassman M.D.   On: 01/21/2021 09:35   ECHOCARDIOGRAM COMPLETE  Result Date: 01/22/2021    ECHOCARDIOGRAM REPORT   Patient Name:   SHAKUR LEMBO Date of Exam: 01/22/2021 Medical Rec #:  [id number]           Height:       67.0 in Accession #:    [id number]          Weight:       150.0 lb Date of Birth:  [birth year]            BSA:          1.790 m Patient Age:    20 years            BP:           100/65 mmHg Patient Gender: M                   HR:           75 bpm. Exam Location:  [facility] Procedure: 2D Echo, Cardiac Doppler and Color Doppler Indications:    Chest Pain  History:        Patient has prior history of Echocardiogram examinations, most                 recent 05/11/2018. Cardiomyopathy and CHF, Stroke; Risk                 Factors:Hypertension, Diabetes and Current Smoker.  Sonographer:    Wenda Low Referring Phys: [place]  1. Left ventricular ejection fraction, by estimation, is 40 to 45%. The left ventricle has mildly decreased function. The left ventricle demonstrates global hypokinesis. There is severe left ventricular hypertrophy. Left ventricular diastolic parameters  are consistent with Grade I diastolic dysfunction (impaired relaxation) Shiny speckled appearing myocardium with severe LVH, mild valve thickening, small pericardial effusion. Findings could suggest cardiac amyolidosis.  2. Right ventricular systolic function is normal. The right ventricular size is normal. Tricuspid regurgitation signal is inadequate for assessing PA pressure.  3. A small pericardial effusion is present.  The pericardial effusion is circumferential.  4. The mitral valve is normal in structure. No evidence of mitral valve regurgitation. No evidence of mitral stenosis.  5.  The aortic valve is tricuspid. There is mild calcification of the aortic valve. There is mild thickening of the aortic valve. Aortic valve regurgitation is not visualized. No aortic stenosis is present.  6. The inferior vena cava is normal in size with greater than 50% respiratory variability, suggesting right atrial pressure of 3 mmHg. FINDINGS  Left Ventricle: Shiny speckled appearing myocardium with severe LVH, mild valve thickening, small pericardial effusion. Findings could suggest cardiac amyolidosis. Left ventricular ejection fraction, by estimation, is 40 to 45%. The left ventricle has mildly decreased function. The left ventricle demonstrates global hypokinesis. The left ventricular internal cavity size was normal in size. There is severe left ventricular hypertrophy. Left ventricular diastolic parameters are consistent with Grade I diastolic dysfunction (impaired relaxation). Normal left ventricular filling pressure. Right Ventricle: The right ventricular size is normal. No increase in right ventricular wall thickness. Right ventricular systolic function is normal. Tricuspid regurgitation signal is inadequate for assessing PA pressure. Left Atrium: Left atrial size was normal in size. Right Atrium: Right atrial size was normal in size. Pericardium: A small pericardial effusion is present. The pericardial effusion is circumferential. Mitral Valve: The mitral valve is normal in structure. There is mild thickening of the mitral valve leaflet(s). There is mild calcification of the mitral valve leaflet(s). Mild mitral annular calcification. No evidence of mitral valve regurgitation. No evidence of mitral valve stenosis. MV peak gradient, 3.7 mmHg. The mean mitral valve gradient is 1.0 mmHg. Tricuspid Valve: The tricuspid valve is normal in  structure. Tricuspid valve regurgitation is mild . No evidence of tricuspid stenosis. Aortic Valve: The aortic valve is tricuspid. There is mild calcification of the aortic valve. There is mild thickening of the aortic valve. There is mild aortic valve annular calcification. Aortic valve regurgitation is not visualized. No aortic stenosis  is present. Aortic valve mean gradient measures 3.0 mmHg. Aortic valve peak gradient measures 5.3 mmHg. Aortic valve area, by VTI measures 2.47 cm. Pulmonic Valve: The pulmonic valve was not well visualized. Pulmonic valve regurgitation is not visualized. No evidence of pulmonic stenosis. Aorta: The aortic root is normal in size and structure. Venous: The inferior vena cava is normal in size with greater than 50% respiratory variability, suggesting right atrial pressure of 3 mmHg. IAS/Shunts: The interatrial septum was not well visualized. Additional Comments: Mild ascites is present.  LEFT VENTRICLE PLAX 2D LVIDd:         4.60 cm      Diastology LVIDs:         3.80 cm      LV e' medial:    5.66 cm/s LV PW:         1.50 cm      LV E/e' medial:  9.1 LV IVS:        1.40 cm      LV e' lateral:   7.18 cm/s LVOT diam:     2.00 cm      LV E/e' lateral: 7.2 LV SV:         53 LV SV Index:   30 LVOT Area:     3.14 cm  LV Volumes (MOD) LV vol d, MOD A2C: 74.4 ml LV vol d, MOD A4C: 105.0 ml LV vol s, MOD A2C: 35.7 ml LV vol s, MOD A4C: 58.5 ml LV SV MOD A2C:     38.7 ml LV SV MOD A4C:     105.0 ml LV SV MOD BP:      40.1 ml RIGHT VENTRICLE RV  Basal diam:  2.95 cm RV Mid diam:    2.30 cm RV S prime:     17.40 cm/s TAPSE (M-mode): 2.4 cm LEFT ATRIUM             Index        RIGHT ATRIUM           Index LA diam:        3.70 cm 2.07 cm/m   RA Area:     14.60 cm LA Vol (A2C):   40.9 ml 22.86 ml/m  RA Volume:   30.60 ml  17.10 ml/m LA Vol (A4C):   27.7 ml 15.48 ml/m LA Biplane Vol: 35.1 ml 19.61 ml/m  AORTIC VALVE                    PULMONIC VALVE AV Area (Vmax):    2.76 cm     PV Vmax:        0.79 m/s AV Area (Vmean):   2.54 cm     PV Peak grad:  2.5 mmHg AV Area (VTI):     2.47 cm AV Vmax:           115.00 cm/s AV Vmean:          76.000 cm/s AV VTI:            0.216 m AV Peak Grad:      5.3 mmHg AV Mean Grad:      3.0 mmHg LVOT Vmax:         101.00 cm/s LVOT Vmean:        61.400 cm/s LVOT VTI:          0.170 m LVOT/AV VTI ratio: 0.79  AORTA Ao Root diam: 3.30 cm Ao Asc diam:  3.20 cm MITRAL VALVE MV Area (PHT): 5.58 cm    SHUNTS MV Area VTI:   2.62 cm    Systemic VTI:  0.17 m MV Peak grad:  3.7 mmHg    Systemic Diam: 2.00 cm MV Mean grad:  1.0 mmHg MV Vmax:       0.96 m/s MV Vmean:      49.1 cm/s MV Decel Time: 136 msec MV E velocity: 51.70 cm/s MV A velocity: 66.70 cm/s MV E/A ratio:  0.78 Carlyle Dolly MD Electronically signed by Carlyle Dolly MD Signature Date/Time: 01/22/2021/1:38:26 PM    Final     Scheduled Meds:  aspirin  81 mg Oral Daily   carvedilol  6.25 mg Oral BID WC   Chlorhexidine Gluconate Cloth  6 each Topical Daily   docusate sodium  100 mg Oral BID   enoxaparin (LOVENOX) injection  40 mg Subcutaneous Daily   pantoprazole  40 mg Oral Daily   spironolactone  25 mg Oral Daily   Continuous Infusions:  cefTRIAXone (ROCEPHIN)  IV 1 g (01/22/21 0907)     LOS: 0 days    Time spent: 35 mins    Brissia Delisa, MD [facility]   If 7PM-7AM, please contact night-coverage

## 2021-01-22 NOTE — Plan of Care (Signed)
°  Problem: Acute Rehab PT Goals(only PT should resolve) Goal: Pt Will Go Supine/Side To Sit Outcome: Progressing Flowsheets (Taken 01/22/2021 1609) Pt will go Supine/Side to Sit: with modified independence Goal: Patient Will Transfer Sit To/From Stand Outcome: Progressing Flowsheets (Taken 01/22/2021 1609) Patient will transfer sit to/from stand:  with modified independence  with supervision Goal: Pt Will Transfer Bed To Chair/Chair To Bed Outcome: Progressing Flowsheets (Taken 01/22/2021 1609) Pt will Transfer Bed to Chair/Chair to Bed:  with modified independence  with supervision Goal: Pt Will Ambulate Outcome: Progressing Flowsheets (Taken 01/22/2021 1609) Pt will Ambulate:  > 125 feet  with modified independence  with supervision  with rolling walker  with least restrictive assistive device   4:10 PM, 01/22/21 Lonell Grandchild, MPT Physical Therapist with [facility] [phone] office [phone] mobile phone

## 2021-01-22 NOTE — Progress Notes (Incomplete)
*  PRELIMINARY RESULTS* Echocardiogram 2D Echocardiogram has been performed.  Shawn [name] 01/22/2021, 12:30 PM

## 2021-01-22 NOTE — Progress Notes (Addendum)
Pt cbg 516. MD notified. Given 12 units sliding scale insulin. Will recheck cbg at 0000 and continue to monitor pt.

## 2021-01-22 NOTE — Evaluation (Signed)
Physical Therapy Evaluation Patient Details Name: DONNAVAN COVAULT MRN: [id number] DOB: 1955-06-28 Today's Date: 01/22/2021  History of Present Illness  GREGGORY SAFRANEK is a 66 y.o. male with PMH significant for essential hypertension, nonischemic cardiomyopathy, polysubstance abuse, history of stroke, with no residual deficits, chronic combined systolic and diastolic heart failure, sent from [facility] with altered mentation.  Patient was found to be confused since morning, he was found to have blood sugar of 65 and was given glucagon, his blood sugar has improved but he still continued to remain confused.  Nursing home staff denied any falls or trauma, head injury, nausea, vomiting, diarrhea. Patient reports having increased urinary discomfort, pain and burning, denies any sick contact or fever or chills.   Clinical Impression  Patient demonstrates slightly labored movement for sitting up bedside, has to lean on armrest of chair for support during transfer to chair without AD, safer using RW and demonstrates good return for ambulation in room and hallway without loss of balance.  Patient tolerated sitting up in chair after therapy - nursing staff aware.  Patient will benefit from continued skilled physical therapy in hospital and recommended venue below to increase strength, balance, endurance for safe ADLs and gait.         Recommendations for follow up therapy are one component of a multi-disciplinary discharge planning process, led by the attending physician.  Recommendations may be updated based on patient status, additional functional criteria and insurance authorization.  Follow Up Recommendations Home health PT    Assistance Recommended at Discharge Intermittent Supervision/Assistance  Patient can return home with the following  A little help with walking and/or transfers;A little help with bathing/dressing/bathroom;Help with stairs or ramp for entrance    Equipment  Recommendations None recommended by PT  Recommendations for Other Services       Functional Status Assessment Patient has had a recent decline in their functional status and demonstrates the ability to make significant improvements in function in a reasonable and predictable amount of time.     Precautions / Restrictions Precautions Precautions: Fall Restrictions Weight Bearing Restrictions: No      Mobility  Bed Mobility Overal bed mobility: Needs Assistance Bed Mobility: Supine to Sit     Supine to sit: Supervision     General bed mobility comments: increased time with labored movement    Transfers Overall transfer level: Needs assistance Equipment used: Rolling walker (2 wheels) Transfers: Sit to/from Stand Sit to Stand: Supervision   Step pivot transfers: Supervision       General transfer comment: has to lean on armrest of chair without AD, safer using RW    Ambulation/Gait Ambulation/Gait assistance: Supervision, Min guard Gait Distance (Feet): 80 Feet Assistive device: Rolling walker (2 wheels) Gait Pattern/deviations: Decreased step length - right, Decreased step length - left, Decreased stride length, Trunk flexed Gait velocity: decreased     General Gait Details: slightly labored cadence without loss of balance, increased time for making turns and limited mostly due to c/o fatigue  Stairs            Wheelchair Mobility    Modified Rankin (Stroke Patients Only)       Balance Overall balance assessment: Needs assistance Sitting-balance support: Feet supported, No upper extremity supported Sitting balance-Leahy Scale: Good Sitting balance - Comments: seated at EOB   Standing balance support: During functional activity, No upper extremity supported Standing balance-Leahy Scale: Poor Standing balance comment: fair/good using RW  Pertinent Vitals/Pain Pain Assessment Pain Assessment: No/denies pain     Home Living Family/patient expects to be discharged to:: Private residence Living Arrangements: Alone Available Help at Discharge: Family;Available PRN/intermittently Type of Home: House Home Access: Stairs to enter Entrance Stairs-Rails: None Entrance Stairs-Number of Steps: 2   Home Layout: One level Home Equipment: Cane - single point;Rollator (4 wheels);Grab bars - tub/shower      Prior Function Prior Level of Function : Independent/Modified Independent             Mobility Comments: community ambulator using SPC, does not drive ADLs Comments: assisted for community ADLs by family     Hand Dominance   Dominant Hand: Right    Extremity/Trunk Assessment   Upper Extremity Assessment Upper Extremity Assessment: Overall WFL for tasks assessed    Lower Extremity Assessment Lower Extremity Assessment: Generalized weakness    Cervical / Trunk Assessment Cervical / Trunk Assessment: Normal  Communication   Communication: No difficulties  Cognition Arousal/Alertness: Awake/alert Behavior During Therapy: WFL for tasks assessed/performed Overall Cognitive Status: Within Functional Limits for tasks assessed                                          General Comments      Exercises     Assessment/Plan    PT Assessment Patient needs continued PT services  PT Problem List Decreased strength;Decreased activity tolerance;Decreased balance;Decreased mobility       PT Treatment Interventions DME instruction;Gait training;Stair training;Functional mobility training;Therapeutic activities;Therapeutic exercise;Balance training;Patient/family education    PT Goals (Current goals can be found in the Care Plan section)  Acute Rehab PT Goals Patient Stated Goal: return home with family to assist PT Goal Formulation: With patient Time For Goal Achievement: 01/26/21 Potential to Achieve Goals: Good    Frequency Min 3X/week     Co-evaluation                AM-PAC PT "6 Clicks" Mobility  Outcome Measure Help needed turning from your back to your side while in a flat bed without using bedrails?: None Help needed moving from lying on your back to sitting on the side of a flat bed without using bedrails?: A Little Help needed moving to and from a bed to a chair (including a wheelchair)?: A Little Help needed standing up from a chair using your arms (e.g., wheelchair or bedside chair)?: A Little Help needed to walk in hospital room?: A Little Help needed climbing 3-5 steps with a railing? : A Little 6 Click Score: 19    End of Session   Activity Tolerance: Patient tolerated treatment well;Patient limited by fatigue Patient left: in chair;with call bell/phone within reach;with chair alarm set Nurse Communication: Mobility status PT Visit Diagnosis: Unsteadiness on feet (R26.81);Other abnormalities of gait and mobility (R26.89);Muscle weakness (generalized) (M62.81)    Time: 6644-0347 PT Time Calculation (min) (ACUTE ONLY): 23 min   Charges:   PT Evaluation $PT Eval Moderate Complexity: 1 Mod PT Treatments $Therapeutic Activity: 23-37 mins        4:08 PM, 01/22/21 Lonell Grandchild, MPT Physical Therapist with [facility] [phone] office [phone] mobile phone

## 2021-01-23 DIAGNOSIS — G9341 Metabolic encephalopathy: Secondary | ICD-10-CM

## 2021-01-23 DIAGNOSIS — I428 Other cardiomyopathies: Secondary | ICD-10-CM

## 2021-01-23 DIAGNOSIS — I5022 Chronic systolic (congestive) heart failure: Secondary | ICD-10-CM

## 2021-01-23 LAB — GLUCOSE, CAPILLARY
Glucose-Capillary: 129 mg/dL — ABNORMAL HIGH (ref 70–99)
Glucose-Capillary: 13 mg/dL — CL (ref 70–99)
Glucose-Capillary: 263 mg/dL — ABNORMAL HIGH (ref 70–99)
Glucose-Capillary: 346 mg/dL — ABNORMAL HIGH (ref 70–99)
Glucose-Capillary: 80 mg/dL (ref 70–99)
Glucose-Capillary: 82 mg/dL (ref 70–99)
Glucose-Capillary: 85 mg/dL (ref 70–99)

## 2021-01-23 LAB — CBC
HCT: 31.5 % — ABNORMAL LOW (ref 39.0–52.0)
Hemoglobin: 10.2 g/dL — ABNORMAL LOW (ref 13.0–17.0)
MCH: 31 pg (ref 26.0–34.0)
MCHC: 32.4 g/dL (ref 30.0–36.0)
MCV: 95.7 fL (ref 80.0–100.0)
Platelets: 484 10*3/uL — ABNORMAL HIGH (ref 150–400)
RBC: 3.29 MIL/uL — ABNORMAL LOW (ref 4.22–5.81)
RDW: 14.3 % (ref 11.5–15.5)
WBC: 5.4 10*3/uL (ref 4.0–10.5)
nRBC: 0 % (ref 0.0–0.2)

## 2021-01-23 LAB — BASIC METABOLIC PANEL
Anion gap: 13 (ref 5–15)
BUN: 51 mg/dL — ABNORMAL HIGH (ref 8–23)
CO2: 22 mmol/L (ref 22–32)
Calcium: 8.2 mg/dL — ABNORMAL LOW (ref 8.9–10.3)
Chloride: 92 mmol/L — ABNORMAL LOW (ref 98–111)
Creatinine, Ser: 1.58 mg/dL — ABNORMAL HIGH (ref 0.61–1.24)
GFR, Estimated: 49 mL/min — ABNORMAL LOW (ref >60)
Glucose, Bld: 517 mg/dL (ref 70–99)
Potassium: 5 mmol/L (ref 3.5–5.1)
Sodium: 127 mmol/L — ABNORMAL LOW (ref 135–145)

## 2021-01-23 LAB — PHOSPHORUS: Phosphorus: 4.4 mg/dL (ref 2.5–4.6)

## 2021-01-23 LAB — MAGNESIUM: Magnesium: 2.5 mg/dL — ABNORMAL HIGH (ref 1.7–2.4)

## 2021-01-23 LAB — LACTIC ACID, PLASMA: Lactic Acid, Venous: 3.2 mmol/L (ref 0.5–1.9)

## 2021-01-23 MED ORDER — DEXTROSE 50 % IV SOLN: 25.0000 g | INTRAVENOUS | Status: AC

## 2021-01-23 MED ORDER — INSULIN ASPART 100 UNIT/ML IJ SOLN: 0.0000 [IU] | Freq: Every day | INTRAMUSCULAR | Status: DC

## 2021-01-23 MED ORDER — SODIUM CHLORIDE 1 G PO TABS
1.0000 g | ORAL_TABLET | Freq: Three times a day (TID) | ORAL | Status: DC
  Administered 2021-01-23 – 2021-01-25 (×6): 1 g via ORAL
  Filled 2021-01-23 (×6): qty 1

## 2021-01-23 MED ORDER — INSULIN DETEMIR 100 UNIT/ML ~~LOC~~ SOLN
10.0000 [IU] | Freq: Every day | SUBCUTANEOUS | Status: DC
  Administered 2021-01-23 – 2021-01-25 (×3): 10 [IU] via SUBCUTANEOUS
  Filled 2021-01-23 (×4): qty 0.1

## 2021-01-23 MED ORDER — DEXTROSE 50 % IV SOLN
INTRAVENOUS | Status: AC
  Administered 2021-01-23: 50 mL
  Filled 2021-01-23: qty 50

## 2021-01-23 MED ORDER — INSULIN ASPART 100 UNIT/ML IJ SOLN
0.0000 [IU] | Freq: Three times a day (TID) | INTRAMUSCULAR | Status: DC
  Administered 2021-01-23: 1 [IU] via SUBCUTANEOUS
  Administered 2021-01-23: 5 [IU] via SUBCUTANEOUS
  Administered 2021-01-23: 7 [IU] via SUBCUTANEOUS
  Administered 2021-01-24: 5 [IU] via SUBCUTANEOUS
  Administered 2021-01-24: 7 [IU] via SUBCUTANEOUS
  Administered 2021-01-25: 3 [IU] via SUBCUTANEOUS
  Administered 2021-01-25: 2 [IU] via SUBCUTANEOUS

## 2021-01-23 MED ORDER — SODIUM CHLORIDE 0.9 % IV BOLUS
500.0000 mL | Freq: Once | INTRAVENOUS | Status: AC
  Administered 2021-01-23: 500 mL via INTRAVENOUS

## 2021-01-23 MED ORDER — INSULIN ASPART 100 UNIT/ML IJ SOLN
12.0000 [IU] | Freq: Once | INTRAMUSCULAR | Status: AC
  Administered 2021-01-23: 12 [IU] via SUBCUTANEOUS

## 2021-01-23 NOTE — Progress Notes (Signed)
Rechecked pt cbg at 2358. Cbg was 559. MD aware. Ordered BMP for lab conformation. Will continue to monitor.

## 2021-01-23 NOTE — Progress Notes (Signed)
Writer in patient's room with NT to assess patient. Patient noted to be acting different and not making sense when talking. Also noted sweating to gown. NT checked BS was 13. Hypoglycemia protocols orders placed, D50 given, MD notified. No new orders obtained. Rechecked BS 80 and gave graham crackers with peanut butter and orange juice. Will continue to monitor.

## 2021-01-23 NOTE — Progress Notes (Signed)
Date and time results received: 01/23/21 1430 (use smartphrase ".now" to insert current time)  Test: lactic acid Critical Value: 3.2  Name of Provider Notified: Dr. Dwyane Dee  Orders Received? Or Actions Taken?: awaiting new orders   Deirdre Pippins, RN

## 2021-01-23 NOTE — TOC Initial Note (Signed)
Transition of Care [facility]) - Initial/Assessment Note    Patient Details  Name: Shawn [name] MRN: [id number] Date of Birth: [birth year]  Transition of Care [facility]) CM/SW Contact:    Ihor Gully, LCSW Phone Number: 01/23/2021, 3:14 PM  Clinical Narrative:                 Patient admitted from [place]. Was in rehab beginning 1/6 and was scheduled to d/c on 1/18 when he was hospitalized. Sisters, Magda Paganini and [place] have arranged for care through the [place] for three days per week. Almieta indicates that she will contact [facility] to identify if they can provide care two days per week. Almeita indicates that patient will d/c home with her until the services through the [place] start.   Expected Discharge Plan: [place] Barriers to Discharge: Continued Medical Work up   Patient Goals and CMS Choice        Expected Discharge Plan and Services Expected Discharge Plan: [place]                                              Prior Living Arrangements/Services     Patient language and need for interpreter reviewed:: Yes        Need for Family Participation in Patient Care: Yes (Comment) Care giver support system in place?: Yes (comment)   Criminal Activity/Legal Involvement Pertinent to Current Situation/Hospitalization: No - Comment as needed  Activities of Daily Living Home Assistive Devices/Equipment: Cane (specify quad or straight) ADL Screening (condition at time of admission) Patient's cognitive ability adequate to safely complete daily activities?: No Is the patient deaf or have difficulty hearing?: No Does the patient have difficulty seeing, even when wearing glasses/contacts?: Yes Does the patient have difficulty concentrating, remembering, or making decisions?: No Patient able to express need for assistance with ADLs?: Yes Does the patient have difficulty dressing or bathing?: Yes Independently performs ADLs?:  No Communication: Independent Dressing (OT): Needs assistance Is this a change from baseline?: Pre-admission baseline Grooming: Needs assistance Is this a change from baseline?: Pre-admission baseline Feeding: Independent Bathing: Needs assistance Is this a change from baseline?: Pre-admission baseline Toileting: Needs assistance Is this a change from baseline?: Pre-admission baseline In/Out Bed: Independent with device (comment) Walks in Home: Independent with device (comment) Does the patient have difficulty walking or climbing stairs?: Yes Weakness of Legs: Both Weakness of Arms/Hands: Both  Permission Sought/Granted Permission sought to share information with : Family Supports    Share Information with NAME: Magda Paganini and Set designer, sisters           Emotional Assessment         Alcohol / Substance Use: Not Applicable Psych Involvement: No (comment)  Admission diagnosis:  Confusion [R41.0] Hypoglycemia [F63.8] Acute metabolic encephalopathy [G66.59] Patient Active Problem List   Diagnosis Date Noted   Confusion    Hyperosmolar hyperglycemic state (HHS) ([place]) 93/57/0177   Acute metabolic encephalopathy 93/90/3009   Elevated troponin 03/16/2020   HLD (hyperlipidemia) 03/16/2020   Abnormal CXR 03/16/2020   Hypothermia 03/16/2020   Hypotension 04/30/2019   Stroke ([name])    GERD (gastroesophageal reflux disease)    Elevated lactic acid level    Dehydration    Protein-calorie malnutrition, severe ([place]) 04/25/2019   Hyperosmolar non-ketotic state due to type 2 diabetes mellitus ([place]) 04/23/2019  Hyperglycemia due to type 2 diabetes mellitus ([place]) 04/23/2019   Hyperkalemia 04/23/2019   Acute renal failure superimposed on stage 3b chronic kidney disease ([place]) 04/23/2019   Diarrhea 04/23/2019   Congestive heart failure with cardiomyopathy ([place]) 01/18/2019   AKI (acute kidney injury) ([place])    Fluid retention    Nonketotic hyperglycinemia ([place]) 09/22/2018   Nonketotic  hypoglycemia 09/22/2018   DKA, type 2 ([place]) 08/07/2018   Essential hypertension 13/68/5992   Chronic systolic CHF (congestive heart failure) ([place]) 12/15/2017   NICM (nonischemic cardiomyopathy) ([place]) 05/13/2017   Type 2 diabetes mellitus with stage 3b chronic kidney disease, with long-term current use of insulin ([place]) 05/13/2017   Smoker 05/13/2017   Polysubstance abuse ([place]) 05/13/2017   Alcohol abuse 05/13/2017   Diabetic ketoacidosis without coma associated with type 2 diabetes mellitus ([place]) 04/27/2017   PCP:  [facility] [place] [place] [address] Phone: [phone] Fax: [phone]     Social Determinants of Health (SDOH) Interventions    Readmission Risk Interventions Readmission Risk Prevention Plan 01/08/2021  Transportation Screening Complete  Social Work Consult for [place] Planning/Counseling Complete  Palliative Care Screening Not Applicable  Medication Review Press photographer) Complete  Some recent data might be hidden

## 2021-01-23 NOTE — Progress Notes (Signed)
Inpatient Diabetes Program Recommendations  AACE/ADA: New Consensus Statement on Inpatient Glycemic Control   Target Ranges:  Prepandial:   less than 140 mg/dL      Peak postprandial:   less than 180 mg/dL (1-2 hours)      Critically ill patients:  140 - 180 mg/dL    Latest Reference Range & Units 01/21/21 08:30 01/22/21 22:00 01/22/21 23:58 01/23/21 4:17 01/23/21 07:37  Glucose-Capillary 70 - 99 mg/dL 149 (H) 516 (HH)  Novolog 12 units 559 (HH)   Novolog 12 units 263 (H)    Latest Reference Range & Units 01/22/21 06:13 01/23/21 01:00  Glucose 70 - 99 mg/dL 336 (H) 517 (HH)   Review of Glycemic Control  Diabetes history: DM2 Outpatient Diabetes medications: Levemir 15 units QHS, Novolog 0-15 units TID with meals, Farxiga 10 mg daily Current orders for Inpatient glycemic control: Novolog 0-9 units TID with meals  Inpatient Diabetes Program Recommendations:    Insulin: Please consider ordering Levemir 10 units Q24H and adding Novolog 0-5 units QHS. If post prandial glucose is consistently elevated, please consider ordering Novolog 3 units TID with meals for meal coverage if patient eats at least 50% of meals.  NOTE: Patient recently inpatient 01/05/21-01/09/21 and inpatient diabetes coordinator spoke with patient on 01/07/21 during that admission. Patient brought in via EMS from [facility] with AMS and hypoglycemia which was treated with glucagon IM.  No basal insulin given since patient arrived to hospital on 01/21/21 and glucose up to 559 mg/dl at 23:58 on 01/22/21. Glucose 263 mg/dl at 7:37 am today. Recommend ordering Levemir and Novolog bedtime correction scale. May also need meal coverage insulin if post prandial glucose is consistently elevated.   Thanks, Barnie Alderman, RN, MSN, CDE Diabetes Coordinator Inpatient Diabetes Program [phone] (Team Pager from 8am to 5pm)

## 2021-01-23 NOTE — Consult Note (Signed)
Cardiology Consultation:  Patient ID: Shawn [name] MRN: [id number]; DOB: Nov 17, 1955  Admit date: 01/21/2021 Date of Consult: 01/23/2021  Primary Care Provider: [facility] Primary Cardiologist: Ida Rogue, MD  Primary Electrophysiologist:  None   Patient Profile:  Shawn [name] is a 66 y.o. male with a hx of diabetes, nonischemic cardiomyopathy, polysubstance abuse, prior stroke who is being seen today for the evaluation of abnormal echocardiogram/systolic heart failure at the request of Shawna Clamp, MD.  History of Present Illness:  Shawn [name] was admitted to the hospital on 01/21/2021 with acute confusion concerning for metabolic encephalopathy.  This was thought to be related to possible hypoglycemia as well as UTI.  He has been admitted to the medical ward and is recovering.  At the time my examination his mental status is improved considerably.  He denies any chest pain or trouble breathing.  There is no concern for acute congestive heart failure.  Blood sugars have been difficult to control but he is working with hospital medicine on this.  Regarding his cardiac history he has a known history of a nonischemic cardiomyopathy.  Ejection fraction was down to 25-30% in the past.  Left heart catheterization performed at the [facility] in 2015 showed no evidence of CAD.  An echocardiogram was obtained yesterday as part of his work-up for altered mental status.  That shows improvement to his ejection fraction of 40-45% with severe LVH concerning for possible cardiac amyloidosis.  Cardiology was asked to assess this.  He reports no cardiac symptoms.  Current heart failure regimen includes Coreg, Entresto and Aldactone.  Also on Farxiga.  He takes torsemide 20 mg daily.  He seems to be doing well from a heart failure standpoint.  Echocardiogram shows LV function has improved.  Again he is without cardiac complaints.  Heart Pathway Score:       Past Medical History: Past  Medical History:  Diagnosis Date   Chronic combined systolic (congestive) and diastolic (congestive) heart failure ([place])    a. TTE 6/15: EF < 20%, mildly dilated LV, DD, mildly dilated LA, mod dilated RA, mild MR, mild to mod TR, mod increased posterior wall thickness, elevated LA and LVEDP; b. 4/12019 Echo: EF 20-25%, diff HK. Gr1 DD, nl RV fxn.   CKD (chronic kidney disease), stage II    Diabetes mellitus with complication ([place])    a. Prior admissions w/ DKA (last 12/2017).   Essential hypertension    NICM (nonischemic cardiomyopathy) ([place])    a. 06/2013 Echo: EF<20%; b. 06/2013 Cath: no signif dzs; c. 04/2017 Echo: 20-25%, gr1 DD.; d.05/2018 Echo: 25-30%   Polysubstance abuse ([place])    a. etoh and tobacco   Stroke [facility])     Past Surgical History: Past Surgical History:  Procedure Laterality Date   NO PAST SURGERIES       Home Medications:  Prior to Admission medications   Medication Sig Start Date End Date Taking? Authorizing Provider  acetaminophen (TYLENOL) 325 MG tablet Take 2 tablets (650 mg total) by mouth every 6 (six) hours as needed for mild pain (or Fever >/= 101). 01/09/21  Yes Barb Merino, MD  aspirin 81 MG chewable tablet Chew 1 tablet (81 mg total) by mouth daily. 04/30/17  Yes Demetrios Loll, MD  B-Complex-C TABS Take 1 tablet by mouth daily.   Yes [provider]  carvedilol (COREG) 6.25 MG tablet Take 6.25 mg by mouth 2 (two) times daily with a meal.   Yes [provider]  Cholecalciferol (VITAMIN D3) 1.25 MG (50000 UT) TABS Take 1 tablet by mouth once a week. Mon   Yes [provider]  dapagliflozin propanediol (FARXIGA) 10 MG TABS tablet Take 1 tablet (10 mg total) by mouth daily before breakfast. MUST KEEP APPT FOR FURTHER REFILLS 03/21/20  Yes Alisa Graff, FNP  ENTRESTO 24-26 MG TAKE 1 TABLET BY MOUTH TWICE A DAY 09/14/19  Yes Hackney, Tina A, FNP  feeding supplement, GLUCERNA SHAKE, (GLUCERNA SHAKE) LIQD Take 237 mLs by mouth 3 (three) times  daily between meals. 01/09/21  Yes Barb Merino, MD  insulin aspart (NOVOLOG) 100 UNIT/ML injection Inject 0-15 Units into the skin 3 (three) times daily with meals. 01/09/21  Yes Ghimire, Dante Gang, MD  insulin detemir (LEVEMIR) 100 UNIT/ML injection Inject 0.15 mLs (15 Units total) into the skin daily. Patient taking differently: Inject 15 Units into the skin at bedtime. 01/10/21  Yes Barb Merino, MD  Multiple Vitamin (MULTIVITAMIN WITH MINERALS) TABS tablet Take 1 tablet by mouth daily. 01/10/21  Yes Barb Merino, MD  omeprazole (PRILOSEC) 20 MG capsule Take 20 mg by mouth daily. 12/18/18  Yes [provider]  potassium chloride SA (KLOR-CON M) 20 MEQ tablet Take 20 mEq by mouth daily.   Yes [provider]  spironolactone (ALDACTONE) 25 MG tablet Take 25 mg by mouth daily. 03/04/20  Yes [provider]  torsemide (DEMADEX) 20 MG tablet Take 1 tablet (20 mg total) by mouth daily. 01/09/21 02/08/21 Yes Barb Merino, MD    Inpatient Medications: Scheduled Meds:  aspirin  81 mg Oral Daily   carvedilol  6.25 mg Oral BID WC   Chlorhexidine Gluconate Cloth  6 each Topical Daily   docusate sodium  100 mg Oral BID   enoxaparin (LOVENOX) injection  40 mg Subcutaneous Daily   insulin aspart  0-5 Units Subcutaneous QHS   insulin aspart  0-9 Units Subcutaneous TID WC   insulin detemir  10 Units Subcutaneous Daily   pantoprazole  40 mg Oral Daily   Continuous Infusions:  cefTRIAXone (ROCEPHIN)  IV 1 g (01/23/21 1033)   PRN Meds: acetaminophen, ondansetron **OR** ondansetron (ZOFRAN) IV, polyvinyl alcohol  Allergies:    Allergies  Allergen Reactions   Pravastatin     Other reaction(s): Muscle pain   Simvastatin     Other reaction(s): Muscle pain    Social History:   Social History   Socioeconomic History   Marital status: Widowed    Spouse name: Not on file   Number of children: Not on file   Years of education: Not on file   Highest education level: Not on file   Occupational History   Not on file  Tobacco Use   Smoking status: Every Day    Packs/day: 0.50    Types: Cigarettes   Smokeless tobacco: Never  Vaping Use   Vaping Use: Never used  Substance and Sexual Activity   Alcohol use: Not Currently   Drug use: Not on file   Sexual activity: Not Currently  Other Topics Concern   Not on file  Social History Narrative   Not on file   Social Determinants of Health   Financial Resource Strain: Not on file  Food Insecurity: Not on file  Transportation Needs: Not on file  Physical Activity: Not on file  Stress: Not on file  Social Connections: Not on file  Intimate Partner Violence: Not on file     Family History:    Family History  Problem Relation Age  of Onset   Congestive Heart Failure Mother    Diabetes Mother    Congestive Heart Failure Brother      ROS:  All other ROS reviewed and negative. Pertinent positives noted in the HPI.     Physical Exam/Data:   Vitals:   01/22/21 2114 01/23/21 0448 01/23/21 0814 01/23/21 0923  BP: 139/74 (!) 152/79  (!) 139/91  Pulse: 70 70  68  Resp: 17 16    Temp: 98.4 F (36.9 C) 98.2 F (36.8 C)    TempSrc:  Oral    SpO2: 99% 100% 96%   Weight:      Height:        Intake/Output Summary (Last 24 hours) at 01/23/2021 1056 Last data filed at 01/23/2021 1039 Gross per 24 hour  Intake 1565.07 ml  Output 800 ml  Net 765.07 ml    Last 3 Weights 01/21/2021 01/05/2021 03/16/2020  Weight (lbs) 150 lb 154 lb 5.2 oz 132 lb 11.5 oz  Weight (kg) 68.04 kg 70 kg 60.2 kg    Body mass index is 23.49 kg/m.  General: Well nourished, well developed, in no acute distress Head: Atraumatic, normal size  Eyes: PEERLA, EOMI  Neck: Supple, no JVD Endocrine: No thryomegaly Cardiac: Normal S1, S2; RRR; no murmurs, rubs, or gallops Lungs: Clear to auscultation bilaterally, no wheezing, rhonchi or rales  Abd: Soft, nontender, no hepatomegaly  Ext: No edema, pulses 2+ Musculoskeletal: No deformities, BUE  and BLE strength normal and equal Skin: Warm and dry, no rashes   Neuro: Alert and oriented to person, place, time, and situation, CNII-XII grossly intact, no focal deficits  Psych: Normal mood and affect   EKG:  The EKG was personally reviewed and demonstrates: Sinus rhythm, heart rate 75, nonspecific ST-T changes Telemetry:  Telemetry was personally reviewed and demonstrates: Sinus rhythm 60 to 70 bpm  Relevant CV Studies: TTE 01/22/2021  1. Left ventricular ejection fraction, by estimation, is 40 to 45%. The  left ventricle has mildly decreased function. The left ventricle  demonstrates global hypokinesis. There is severe left ventricular  hypertrophy. Left ventricular diastolic parameters   are consistent with Grade I diastolic dysfunction (impaired relaxation)  Shiny speckled appearing myocardium with severe LVH, mild valve  thickening, small pericardial effusion. Findings could suggest cardiac  amyolidosis.   2. Right ventricular systolic function is normal. The right ventricular  size is normal. Tricuspid regurgitation signal is inadequate for assessing  PA pressure.   3. A small pericardial effusion is present. The pericardial effusion is  circumferential.   4. The mitral valve is normal in structure. No evidence of mitral valve  regurgitation. No evidence of mitral stenosis.   5. The aortic valve is tricuspid. There is mild calcification of the  aortic valve. There is mild thickening of the aortic valve. Aortic valve  regurgitation is not visualized. No aortic stenosis is present.   6. The inferior vena cava is normal in size with greater than 50%  respiratory variability, suggesting right atrial pressure of 3 mmHg.   Laboratory Data: High Sensitivity Troponin:  No results for input(s): TROPONINIHS in the last 720 hours.   Cardiac EnzymesNo results for input(s): TROPONINI in the last 168 hours. No results for input(s): TROPIPOC in the last 168 hours.  Chemistry Recent Labs   Lab 01/21/21 0921 01/22/21 0613 01/23/21 0100  NA 130* 131* 127*  K 4.3 5.0 5.0  CL 93* 95* 92*  CO2 26 24 22   GLUCOSE 144* 336* 517*  BUN 38* 39* 51*  CREATININE 1.04 1.09 1.58*  CALCIUM 8.6* 8.2* 8.2*  GFRNONAA >60 >60 49*  ANIONGAP 11 12 13     Recent Labs  Lab 01/21/21 0921 01/22/21 0613  PROT 6.7 5.7*  ALBUMIN 2.8* 2.4*  AST 39 53*  ALT 40 37  ALKPHOS 214* 194*  BILITOT 0.7 1.0   Hematology Recent Labs  Lab 01/21/21 0921 01/22/21 0613 01/23/21 0100  WBC 8.5 6.1 5.4  RBC 3.97* 3.50* 3.29*  HGB 12.6* 11.1* 10.2*  HCT 38.1* 33.5* 31.5*  MCV 96.0 95.7 95.7  MCH 31.7 31.7 31.0  MCHC 33.1 33.1 32.4  RDW 14.3 14.4 14.3  PLT 506* 472* 484*   BNPNo results for input(s): BNP, PROBNP in the last 168 hours.  DDimer No results for input(s): DDIMER in the last 168 hours.  Radiology/Studies:  CT Head Wo Contrast  Result Date: 01/21/2021 CLINICAL DATA:  Dizziness EXAM: CT HEAD WITHOUT CONTRAST TECHNIQUE: Contiguous axial images were obtained from the base of the skull through the vertex without intravenous contrast. RADIATION DOSE REDUCTION: This exam was performed according to the departmental dose-optimization program which includes automated exposure control, adjustment of the mA and/or kV according to patient size and/or use of iterative reconstruction technique. COMPARISON:  CT head 01/05/2021 FINDINGS: Brain: No acute intracranial hemorrhage, mass effect, or herniation. No extra-axial fluid collections. No evidence of acute territorial infarct. No hydrocephalus. Mild cortical volume loss. Mild patchy hypodensities in the periventricular and subcortical white matter, likely secondary to chronic microvascular ischemic changes. Vascular: No hyperdense vessel or unexpected calcification. Skull: Normal. Negative for fracture or focal lesion. Sinuses/Orbits: No acute finding. Other: None. IMPRESSION: Chronic changes with no acute intracranial process identified. Electronically  Signed   By: Ofilia Neas M.D.   On: 01/21/2021 09:17   DG Chest Port 1 View  Result Date: 01/21/2021 CLINICAL DATA:  Concern for sepsis EXAM: PORTABLE CHEST 1 VIEW COMPARISON:  Chest x-ray dated January 05, 2021 FINDINGS: Cardiac and mediastinal contours are unchanged. Mild interstitial opacities, likely due to underlying emphysema. No focal consolidation. No large pleural effusion or pneumothorax. IMPRESSION: No acute lung opacity. Electronically Signed   By: Yetta Glassman M.D.   On: 01/21/2021 09:35   ECHOCARDIOGRAM COMPLETE  Result Date: 01/22/2021    ECHOCARDIOGRAM REPORT   Patient Name:   BLANCHE GALLIEN Date of Exam: 01/22/2021 Medical Rec #:  [id number]           Height:       67.0 in Accession #:    [id number]          Weight:       150.0 lb Date of Birth:  January 21, 1955            BSA:          1.790 m Patient Age:    62 years            BP:           100/65 mmHg Patient Gender: M                   HR:           75 bpm. Exam Location:  [facility] Procedure: 2D Echo, Cardiac Doppler and Color Doppler Indications:    Chest Pain  History:        Patient has prior history of Echocardiogram examinations, most                 recent  05/11/2018. Cardiomyopathy and CHF, Stroke; Risk                 Factors:Hypertension, Diabetes and Current Smoker.  Sonographer:    Wenda Low Referring Phys: [place]  1. Left ventricular ejection fraction, by estimation, is 40 to 45%. The left ventricle has mildly decreased function. The left ventricle demonstrates global hypokinesis. There is severe left ventricular hypertrophy. Left ventricular diastolic parameters  are consistent with Grade I diastolic dysfunction (impaired relaxation) Shiny speckled appearing myocardium with severe LVH, mild valve thickening, small pericardial effusion. Findings could suggest cardiac amyolidosis.  2. Right ventricular systolic function is normal. The right ventricular size is normal. Tricuspid  regurgitation signal is inadequate for assessing PA pressure.  3. A small pericardial effusion is present. The pericardial effusion is circumferential.  4. The mitral valve is normal in structure. No evidence of mitral valve regurgitation. No evidence of mitral stenosis.  5. The aortic valve is tricuspid. There is mild calcification of the aortic valve. There is mild thickening of the aortic valve. Aortic valve regurgitation is not visualized. No aortic stenosis is present.  6. The inferior vena cava is normal in size with greater than 50% respiratory variability, suggesting right atrial pressure of 3 mmHg. FINDINGS  Left Ventricle: Shiny speckled appearing myocardium with severe LVH, mild valve thickening, small pericardial effusion. Findings could suggest cardiac amyolidosis. Left ventricular ejection fraction, by estimation, is 40 to 45%. The left ventricle has mildly decreased function. The left ventricle demonstrates global hypokinesis. The left ventricular internal cavity size was normal in size. There is severe left ventricular hypertrophy. Left ventricular diastolic parameters are consistent with Grade I diastolic dysfunction (impaired relaxation). Normal left ventricular filling pressure. Right Ventricle: The right ventricular size is normal. No increase in right ventricular wall thickness. Right ventricular systolic function is normal. Tricuspid regurgitation signal is inadequate for assessing PA pressure. Left Atrium: Left atrial size was normal in size. Right Atrium: Right atrial size was normal in size. Pericardium: A small pericardial effusion is present. The pericardial effusion is circumferential. Mitral Valve: The mitral valve is normal in structure. There is mild thickening of the mitral valve leaflet(s). There is mild calcification of the mitral valve leaflet(s). Mild mitral annular calcification. No evidence of mitral valve regurgitation. No evidence of mitral valve stenosis. MV peak gradient,  3.7 mmHg. The mean mitral valve gradient is 1.0 mmHg. Tricuspid Valve: The tricuspid valve is normal in structure. Tricuspid valve regurgitation is mild . No evidence of tricuspid stenosis. Aortic Valve: The aortic valve is tricuspid. There is mild calcification of the aortic valve. There is mild thickening of the aortic valve. There is mild aortic valve annular calcification. Aortic valve regurgitation is not visualized. No aortic stenosis  is present. Aortic valve mean gradient measures 3.0 mmHg. Aortic valve peak gradient measures 5.3 mmHg. Aortic valve area, by VTI measures 2.47 cm. Pulmonic Valve: The pulmonic valve was not well visualized. Pulmonic valve regurgitation is not visualized. No evidence of pulmonic stenosis. Aorta: The aortic root is normal in size and structure. Venous: The inferior vena cava is normal in size with greater than 50% respiratory variability, suggesting right atrial pressure of 3 mmHg. IAS/Shunts: The interatrial septum was not well visualized. Additional Comments: Mild ascites is present.  LEFT VENTRICLE PLAX 2D LVIDd:         4.60 cm      Diastology LVIDs:         3.80 cm  LV e' medial:    5.66 cm/s LV PW:         1.50 cm      LV E/e' medial:  9.1 LV IVS:        1.40 cm      LV e' lateral:   7.18 cm/s LVOT diam:     2.00 cm      LV E/e' lateral: 7.2 LV SV:         53 LV SV Index:   30 LVOT Area:     3.14 cm  LV Volumes (MOD) LV vol d, MOD A2C: 74.4 ml LV vol d, MOD A4C: 105.0 ml LV vol s, MOD A2C: 35.7 ml LV vol s, MOD A4C: 58.5 ml LV SV MOD A2C:     38.7 ml LV SV MOD A4C:     105.0 ml LV SV MOD BP:      40.1 ml RIGHT VENTRICLE RV Basal diam:  2.95 cm RV Mid diam:    2.30 cm RV S prime:     17.40 cm/s TAPSE (M-mode): 2.4 cm LEFT ATRIUM             Index        RIGHT ATRIUM           Index LA diam:        3.70 cm 2.07 cm/m   RA Area:     14.60 cm LA Vol (A2C):   40.9 ml 22.86 ml/m  RA Volume:   30.60 ml  17.10 ml/m LA Vol (A4C):   27.7 ml 15.48 ml/m LA Biplane Vol: 35.1  ml 19.61 ml/m  AORTIC VALVE                    PULMONIC VALVE AV Area (Vmax):    2.76 cm     PV Vmax:       0.79 m/s AV Area (Vmean):   2.54 cm     PV Peak grad:  2.5 mmHg AV Area (VTI):     2.47 cm AV Vmax:           115.00 cm/s AV Vmean:          76.000 cm/s AV VTI:            0.216 m AV Peak Grad:      5.3 mmHg AV Mean Grad:      3.0 mmHg LVOT Vmax:         101.00 cm/s LVOT Vmean:        61.400 cm/s LVOT VTI:          0.170 m LVOT/AV VTI ratio: 0.79  AORTA Ao Root diam: 3.30 cm Ao Asc diam:  3.20 cm MITRAL VALVE MV Area (PHT): 5.58 cm    SHUNTS MV Area VTI:   2.62 cm    Systemic VTI:  0.17 m MV Peak grad:  3.7 mmHg    Systemic Diam: 2.00 cm MV Mean grad:  1.0 mmHg MV Vmax:       0.96 m/s MV Vmean:      49.1 cm/s MV Decel Time: 136 msec MV E velocity: 51.70 cm/s MV A velocity: 66.70 cm/s MV E/A ratio:  0.78 Carlyle Dolly MD Electronically signed by Carlyle Dolly MD Signature Date/Time: 01/22/2021/1:38:26 PM    Final     Assessment and Plan:   #Chronic systolic heart failure, EF 40-45% #Severe LVH #Non-ischemic Cardiomyopathy  -Longstanding history of a nonischemic cardiomyopathy.  Left heart catheterization in  2015 at the [facility] showed no evidence of CAD.  His ejection fraction was 25-30% but it has improved to 40-45% on this admission. -He is without heart failure complaints. -There is mention of possible cardiac amyloidosis.  He does have severe LVH on echo but tissue Doppler inconsistent with cardiac amyloidosis.  I believe the simplest thing to do would be to consider an outpatient PYP scan.  He can pursue this at the [facility].  That is who he follows with. -From a heart failure standpoint he is medically stable.  We will continue Coreg 6.25 mg twice daily.  Entresto and Aldactone have been discontinued due to AKI.  Please restart these as you are able.  Also restart home [place]. -Current diuretic regimen includes torsemide 20 mg daily.  Can restart this as you see fit.  He is currently  euvolemic.  [facility] will sign off.   Medication Recommendations:  As above.  Other recommendations (labs, testing, etc):  Consider outpatient PYP.  Follow up as an outpatient:  Mr. Louro follows with the [facility]. Can follow there as outpatient vs. [place].   For questions or updates, please contact [place] Please consult [email or web address] for contact info under   Signed, [place] T. Audie Box, MD, [place]  01/23/2021 10:56 AM

## 2021-01-23 NOTE — Progress Notes (Signed)
PROGRESS NOTE    Shawn [name]  GBT:[id number] DOB: 07-19-1955 DOA: 01/21/2021 PCP: [facility]   Brief Narrative: This 66 years old male with PMH significant for essential hypertension, nonischemic cardiomyopathy, polysubstance abuse, history of stroke with no residual deficits, chronic combined systolic and diastolic heart failure sent from [facility] with altered mentation.  Patient was found to be confused since morning, he was found to have blood sugar of 65 and was given glucagon,  his blood sugar is improved but he still continued to remain confused.  Nursing home staff denied any falls or trauma,  head injury, nausea, vomiting, diarrhea.  Patient reports having increased urinary discomfort, pain and burning, denies any sick contacts,  fever or chills. Patient was admitted for acute metabolic encephalopathy found to be multifactorial from UTI, hyponatremia and dehydration.  Echocardiogram shows EF 40 to 45% which is in fact improved from prior EF of 25 to 30%.  Cardiology consulted recommended to continue current medications, resume heart failure medications when able to tolerate.   Assessment & Plan:   Principal Problem:   Acute metabolic encephalopathy Active Problems:   NICM (nonischemic cardiomyopathy) ([name])   Type 2 diabetes mellitus with stage 3b chronic kidney disease, with long-term current use of insulin ([name])   Polysubstance abuse ([name])   Alcohol abuse   Chronic systolic CHF (congestive heart failure) ([name])   Essential hypertension   GERD (gastroesophageal reflux disease)   HLD (hyperlipidemia)  Acute metabolic encephalopathy: > Improved. Suspect multifactorial, could be from hyponatremia, hypoglycemia, UTI, dehydration. Patient was found hypoglycemic, given glucagon,  blood sugar has improved. Patient reported increased urinary frequency and burning, continue IV ceftriaxone. UA is unremarkable.  Urine culture no growth.  Continue IV hydration. CT  head: No acute abnormality noted.  Chronic microvascular changes.   Hyponatremia: Could be secondary to dehydration. Continue gentle NS hydration. Continue to monitor serum sodium 130>131>127  Diabetes mellitus with hypoglycemia : Continue to monitor fingersticks. Blood sugar has improved and remains controlled. Start Levemir 10 units at bedtime and continue regular insulin sliding scale.    Essential hypertension: Continue carvedilol. Held [place], spironolactone and torsemide given hypotension. Resume Entresto, spironolactone and torsemide.     CKD stage IIIa: Serum creatinine slightly up to 1.58. Avoid nephrotoxic medications. Continue to monitor serum creatinine.   Dehydration: Resolved with IV hydration.   UTI: UA unremarkable but patient reports significant burning and frequency. Continue ceftriaxone for 3 days, urine culture no growth.   Chronic combined systolic and diastolic heart failure: Last echocardiogram LVEF 25 to 30%. Resume Entresto, spironolactone, Farxiga and torsemide when blood pressure improves. Repeat echocardiogram shows LVEF 40 to 45%.   Lactic acidosis: Could be secondary to dehydration, resolved with IV hydration. Sepsis ruled out.   DVT prophylaxis: Lovenox. Code Status: Full code. Family Communication: No family at bed side. Disposition Plan:   Status is: Inpatient  Remains inpatient appropriate because:  Admitted for altered mental status, found to have UTI requiring IV antibiotics.  PT and OT evaluation pending.   Consultants:   None  Procedures: None  Antimicrobials:   Anti-infectives (From admission, onward)    Start     Dose/Rate Route Frequency Ordered Stop   01/22/21 1000  cefTRIAXone (ROCEPHIN) 1 g in sodium chloride 0.9 % 100 mL IVPB        1 g 200 mL/hr over 30 Minutes Intravenous Every 24 hours 01/21/21 1234 01/25/21 0959   01/21/21 0900  cefTRIAXone (ROCEPHIN) 2 g in sodium  chloride 0.9 % 100 mL IVPB        2  g 200 mL/hr over 30 Minutes Intravenous  Once 01/21/21 0845 01/21/21 1036        Subjective: Patient was seen and examined at bedside.  Overnight events noted.   Patient reports feeling much better.He still feels very weak and tired.  He denies any chest pain or shortness of breath.    Objective: Vitals:   01/23/21 0448 01/23/21 0814 01/23/21 0923 01/23/21 1306  BP: (!) 152/79  (!) 139/91 124/84  Pulse: 70  68 65  Resp: 16   16  Temp: 98.2 F (36.8 C)   97.7 F (36.5 C)  TempSrc: Oral   Oral  SpO2: 100% 96%  100%  Weight:      Height:        Intake/Output Summary (Last 24 hours) at 01/23/2021 1319 Last data filed at 01/23/2021 1039 Gross per 24 hour  Intake 1325.07 ml  Output 550 ml  Net 775.07 ml   Filed Weights   01/21/21 0823  Weight: 68 kg    Examination:  General exam: Appears comfortable, deconditioned, not in any acute distress. Respiratory system: Clear to auscultation bilaterally, respiratory effort normal, RR 12 Cardiovascular system: S1-S2 heard, regular rate and rhythm, no murmur. Gastrointestinal system: Abdomen is soft, nontender, nondistended, BS+ Central nervous system: Alert and oriented x 2. No focal neurological deficits. Extremities: No edema, no cyanosis, no clubbing. Skin: No rashes, lesions or ulcers Psychiatry: Judgement and insight appear normal. Mood & affect appropriate.     Data Reviewed: I have personally reviewed following labs and imaging studies  CBC: Recent Labs  Lab 01/21/21 0921 01/22/21 0613 01/23/21 0100  WBC 8.5 6.1 5.4  NEUTROABS 7.3  --   --   HGB 12.6* 11.1* 10.2*  HCT 38.1* 33.5* 31.5*  MCV 96.0 95.7 95.7  PLT 506* 472* 338*   Basic Metabolic Panel: Recent Labs  Lab 01/21/21 0921 01/22/21 0613 01/23/21 0100  NA 130* 131* 127*  K 4.3 5.0 5.0  CL 93* 95* 92*  CO2 26 24 22   GLUCOSE 144* 336* 517*  BUN 38* 39* 51*  CREATININE 1.04 1.09 1.58*  CALCIUM 8.6* 8.2* 8.2*  MG 2.5*  --  2.5*  PHOS 2.6  --  4.4    GFR: Estimated Creatinine Clearance: 44.2 mL/min (A) (by C-G formula based on SCr of 1.58 mg/dL (H)). Liver Function Tests: Recent Labs  Lab 01/21/21 0921 01/22/21 0613  AST 39 53*  ALT 40 37  ALKPHOS 214* 194*  BILITOT 0.7 1.0  PROT 6.7 5.7*  ALBUMIN 2.8* 2.4*   No results for input(s): LIPASE, AMYLASE in the last 168 hours. No results for input(s): AMMONIA in the last 168 hours. Coagulation Profile: Recent Labs  Lab 01/21/21 0921  INR 1.0   Cardiac Enzymes: No results for input(s): CKTOTAL, CKMB, CKMBINDEX, TROPONINI in the last 168 hours. BNP (last 3 results) No results for input(s): PROBNP in the last 8760 hours. HbA1C: No results for input(s): HGBA1C in the last 72 hours. CBG: Recent Labs  Lab 01/21/21 0830 01/22/21 2200 01/22/21 2358 01/23/21 0737 01/23/21 1123  GLUCAP 149* 516* 559* 263* 129*   Lipid Profile: No results for input(s): CHOL, HDL, LDLCALC, TRIG, CHOLHDL, LDLDIRECT in the last 72 hours. Thyroid Function Tests: Recent Labs    01/21/21 0921  TSH 5.451*   Anemia Panel: No results for input(s): VITAMINB12, FOLATE, FERRITIN, TIBC, IRON, RETICCTPCT in the last 72 hours. Sepsis  Labs: Recent Labs  Lab 01/21/21 0921 01/21/21 0946 01/21/21 1159  PROCALCITON 0.66  --   --   LATICACIDVEN  --  2.3* 1.4    Recent Results (from the past 240 hour(s))  Resp Panel by RT-PCR (Flu A&B, Covid) Nasopharyngeal Swab     Status: None   Collection Time: 01/21/21  9:19 AM   Specimen: Nasopharyngeal Swab; Nasopharyngeal(NP) swabs in vial transport medium  Result Value Ref Range Status   SARS Coronavirus 2 by RT PCR NEGATIVE NEGATIVE Final    Comment: (NOTE) SARS-CoV-2 target nucleic acids are NOT DETECTED.  The SARS-CoV-2 RNA is generally detectable in upper respiratory specimens during the acute phase of infection. The lowest concentration of SARS-CoV-2 viral copies this assay can detect is 138 copies/mL. A negative result does not preclude  SARS-Cov-2 infection and should not be used as the sole basis for treatment or other patient management decisions. A negative result may occur with  improper specimen collection/handling, submission of specimen other than nasopharyngeal swab, presence of viral mutation(s) within the areas targeted by this assay, and inadequate number of viral copies(<138 copies/mL). A negative result must be combined with clinical observations, patient history, and epidemiological information. The expected result is Negative.  Fact Sheet for Patients:  EntrepreneurPulse.com.au  Fact Sheet for Healthcare Providers:  IncredibleEmployment.be  This test is no t yet approved or cleared by the [place] FDA and  has been authorized for detection and/or diagnosis of SARS-CoV-2 by FDA under an Emergency Use Authorization (EUA). This EUA will remain  in effect (meaning this test can be used) for the duration of the COVID-19 declaration under Section 564(b)(1) of the Act, 21 U.S.C.section 360bbb-3(b)(1), unless the authorization is terminated  or revoked sooner.       Influenza A by PCR NEGATIVE NEGATIVE Final   Influenza B by PCR NEGATIVE NEGATIVE Final    Comment: (NOTE) The Xpert Xpress SARS-CoV-2/FLU/RSV plus assay is intended as an aid in the diagnosis of influenza from Nasopharyngeal swab specimens and should not be used as a sole basis for treatment. Nasal washings and aspirates are unacceptable for Xpert Xpress SARS-CoV-2/FLU/RSV testing.  Fact Sheet for Patients: EntrepreneurPulse.com.au  Fact Sheet for Healthcare Providers: IncredibleEmployment.be  This test is not yet approved or cleared by the [place] FDA and has been authorized for detection and/or diagnosis of SARS-CoV-2 by FDA under an Emergency Use Authorization (EUA). This EUA will remain in effect (meaning this test can be used) for the duration of  the COVID-19 declaration under Section 564(b)(1) of the Act, 21 U.S.C. section 360bbb-3(b)(1), unless the authorization is terminated or revoked.  Performed at [facility], [address], [place], [place] [address]   Blood Culture (routine x 2)     Status: None (Preliminary result)   Collection Time: 01/21/21  9:47 AM   Specimen: BLOOD RIGHT WRIST  Result Value Ref Range Status   Specimen Description BLOOD RIGHT WRIST  Final   Special Requests   Final    BOTTLES DRAWN AEROBIC ONLY Blood Culture adequate volume   Culture   Final    NO GROWTH 2 DAYS Performed at [facility], [address], [place], [place] [address]    Report Status PENDING  Incomplete  Blood Culture (routine x 2)     Status: None (Preliminary result)   Collection Time: 01/21/21  9:47 AM   Specimen: BLOOD LEFT FOREARM  Result Value Ref Range Status   Specimen Description BLOOD LEFT FOREARM  Final  Special Requests   Final    BOTTLES DRAWN AEROBIC AND ANAEROBIC Blood Culture adequate volume   Culture   Final    NO GROWTH 2 DAYS Performed at [facility], [address], [place], [place] [address]    Report Status PENDING  Incomplete  Urine Culture     Status: None   Collection Time: 01/21/21 10:21 AM   Specimen: Urine, Catheterized  Result Value Ref Range Status   Specimen Description   Final    URINE, CATHETERIZED Performed at [facility], [address], [place], [place] [address]    Special Requests   Final    NONE Performed at [facility], [address] [place], [place] [address]    Culture   Final    NO GROWTH Performed at [facility], [place] [address] [place], [place] [address]    Report Status 01/22/2021 FINAL  Final    Radiology Studies: ECHOCARDIOGRAM COMPLETE  Result Date: 01/22/2021    ECHOCARDIOGRAM REPORT   Patient Name:   Shawn [name] Date of Exam: 01/22/2021 Medical Rec #:  [id number]           Height:       67.0 in Accession #:    [id number]          Weight:       150.0  lb Date of Birth:  November 18, 1955            BSA:          1.790 m Patient Age:    8 years            BP:           100/65 mmHg Patient Gender: M                   HR:           75 bpm. Exam Location:  [facility] Procedure: 2D Echo, Cardiac Doppler and Color Doppler Indications:    Chest Pain  History:        Patient has prior history of Echocardiogram examinations, most                 recent 05/11/2018. Cardiomyopathy and CHF, Stroke; Risk                 Factors:Hypertension, Diabetes and Current Smoker.  Sonographer:    Wenda Low Referring Phys: [place]  1. Left ventricular ejection fraction, by estimation, is 40 to 45%. The left ventricle has mildly decreased function. The left ventricle demonstrates global hypokinesis. There is severe left ventricular hypertrophy. Left ventricular diastolic parameters  are consistent with Grade I diastolic dysfunction (impaired relaxation) Shiny speckled appearing myocardium with severe LVH, mild valve thickening, small pericardial effusion. Findings could suggest cardiac amyolidosis.  2. Right ventricular systolic function is normal. The right ventricular size is normal. Tricuspid regurgitation signal is inadequate for assessing PA pressure.  3. A small pericardial effusion is present. The pericardial effusion is circumferential.  4. The mitral valve is normal in structure. No evidence of mitral valve regurgitation. No evidence of mitral stenosis.  5. The aortic valve is tricuspid. There is mild calcification of the aortic valve. There is mild thickening of the aortic valve. Aortic valve regurgitation is not visualized. No aortic stenosis is present.  6. The inferior vena cava is normal in size with greater than 50% respiratory variability, suggesting right atrial pressure of 3 mmHg. FINDINGS  Left Ventricle:  Shiny speckled appearing myocardium with severe LVH, mild valve thickening, small pericardial effusion. Findings could suggest cardiac  amyolidosis. Left ventricular ejection fraction, by estimation, is 40 to 45%. The left ventricle has mildly decreased function. The left ventricle demonstrates global hypokinesis. The left ventricular internal cavity size was normal in size. There is severe left ventricular hypertrophy. Left ventricular diastolic parameters are consistent with Grade I diastolic dysfunction (impaired relaxation). Normal left ventricular filling pressure. Right Ventricle: The right ventricular size is normal. No increase in right ventricular wall thickness. Right ventricular systolic function is normal. Tricuspid regurgitation signal is inadequate for assessing PA pressure. Left Atrium: Left atrial size was normal in size. Right Atrium: Right atrial size was normal in size. Pericardium: A small pericardial effusion is present. The pericardial effusion is circumferential. Mitral Valve: The mitral valve is normal in structure. There is mild thickening of the mitral valve leaflet(s). There is mild calcification of the mitral valve leaflet(s). Mild mitral annular calcification. No evidence of mitral valve regurgitation. No evidence of mitral valve stenosis. MV peak gradient, 3.7 mmHg. The mean mitral valve gradient is 1.0 mmHg. Tricuspid Valve: The tricuspid valve is normal in structure. Tricuspid valve regurgitation is mild . No evidence of tricuspid stenosis. Aortic Valve: The aortic valve is tricuspid. There is mild calcification of the aortic valve. There is mild thickening of the aortic valve. There is mild aortic valve annular calcification. Aortic valve regurgitation is not visualized. No aortic stenosis  is present. Aortic valve mean gradient measures 3.0 mmHg. Aortic valve peak gradient measures 5.3 mmHg. Aortic valve area, by VTI measures 2.47 cm. Pulmonic Valve: The pulmonic valve was not well visualized. Pulmonic valve regurgitation is not visualized. No evidence of pulmonic stenosis. Aorta: The aortic root is normal in size  and structure. Venous: The inferior vena cava is normal in size with greater than 50% respiratory variability, suggesting right atrial pressure of 3 mmHg. IAS/Shunts: The interatrial septum was not well visualized. Additional Comments: Mild ascites is present.  LEFT VENTRICLE PLAX 2D LVIDd:         4.60 cm      Diastology LVIDs:         3.80 cm      LV e' medial:    5.66 cm/s LV PW:         1.50 cm      LV E/e' medial:  9.1 LV IVS:        1.40 cm      LV e' lateral:   7.18 cm/s LVOT diam:     2.00 cm      LV E/e' lateral: 7.2 LV SV:         53 LV SV Index:   30 LVOT Area:     3.14 cm  LV Volumes (MOD) LV vol d, MOD A2C: 74.4 ml LV vol d, MOD A4C: 105.0 ml LV vol s, MOD A2C: 35.7 ml LV vol s, MOD A4C: 58.5 ml LV SV MOD A2C:     38.7 ml LV SV MOD A4C:     105.0 ml LV SV MOD BP:      40.1 ml RIGHT VENTRICLE RV Basal diam:  2.95 cm RV Mid diam:    2.30 cm RV S prime:     17.40 cm/s TAPSE (M-mode): 2.4 cm LEFT ATRIUM             Index        RIGHT ATRIUM  Index LA diam:        3.70 cm 2.07 cm/m   RA Area:     14.60 cm LA Vol (A2C):   40.9 ml 22.86 ml/m  RA Volume:   30.60 ml  17.10 ml/m LA Vol (A4C):   27.7 ml 15.48 ml/m LA Biplane Vol: 35.1 ml 19.61 ml/m  AORTIC VALVE                    PULMONIC VALVE AV Area (Vmax):    2.76 cm     PV Vmax:       0.79 m/s AV Area (Vmean):   2.54 cm     PV Peak grad:  2.5 mmHg AV Area (VTI):     2.47 cm AV Vmax:           115.00 cm/s AV Vmean:          76.000 cm/s AV VTI:            0.216 m AV Peak Grad:      5.3 mmHg AV Mean Grad:      3.0 mmHg LVOT Vmax:         101.00 cm/s LVOT Vmean:        61.400 cm/s LVOT VTI:          0.170 m LVOT/AV VTI ratio: 0.79  AORTA Ao Root diam: 3.30 cm Ao Asc diam:  3.20 cm MITRAL VALVE MV Area (PHT): 5.58 cm    SHUNTS MV Area VTI:   2.62 cm    Systemic VTI:  0.17 m MV Peak grad:  3.7 mmHg    Systemic Diam: 2.00 cm MV Mean grad:  1.0 mmHg MV Vmax:       0.96 m/s MV Vmean:      49.1 cm/s MV Decel Time: 136 msec MV E velocity: 51.70  cm/s MV A velocity: 66.70 cm/s MV E/A ratio:  0.78 Carlyle Dolly MD Electronically signed by Carlyle Dolly MD Signature Date/Time: 01/22/2021/1:38:26 PM    Final     Scheduled Meds:  aspirin  81 mg Oral Daily   carvedilol  6.25 mg Oral BID WC   Chlorhexidine Gluconate Cloth  6 each Topical Daily   docusate sodium  100 mg Oral BID   enoxaparin (LOVENOX) injection  40 mg Subcutaneous Daily   insulin aspart  0-5 Units Subcutaneous QHS   insulin aspart  0-9 Units Subcutaneous TID WC   insulin detemir  10 Units Subcutaneous Daily   pantoprazole  40 mg Oral Daily   sodium chloride  1 g Oral TID WC   Continuous Infusions:  cefTRIAXone (ROCEPHIN)  IV 1 g (01/23/21 1033)   sodium chloride       LOS: 1 day    Time spent: 35 mins    Adalae Baysinger, MD [facility]   If 7PM-7AM, please contact night-coverage

## 2021-01-24 LAB — CBC
HCT: 32.6 % — ABNORMAL LOW (ref 39.0–52.0)
Hemoglobin: 11.1 g/dL — ABNORMAL LOW (ref 13.0–17.0)
MCH: 32.1 pg (ref 26.0–34.0)
MCHC: 34 g/dL (ref 30.0–36.0)
MCV: 94.2 fL (ref 80.0–100.0)
Platelets: 399 10*3/uL (ref 150–400)
RBC: 3.46 MIL/uL — ABNORMAL LOW (ref 4.22–5.81)
RDW: 14.1 % (ref 11.5–15.5)
WBC: 5.4 10*3/uL (ref 4.0–10.5)
nRBC: 0 % (ref 0.0–0.2)

## 2021-01-24 LAB — BASIC METABOLIC PANEL
Anion gap: 6 (ref 5–15)
BUN: 38 mg/dL — ABNORMAL HIGH (ref 8–23)
CO2: 26 mmol/L (ref 22–32)
Calcium: 8 mg/dL — ABNORMAL LOW (ref 8.9–10.3)
Chloride: 95 mmol/L — ABNORMAL LOW (ref 98–111)
Creatinine, Ser: 1 mg/dL (ref 0.61–1.24)
GFR, Estimated: >60 mL/min (ref >60)
Glucose, Bld: 252 mg/dL — ABNORMAL HIGH (ref 70–99)
Potassium: 4.4 mmol/L (ref 3.5–5.1)
Sodium: 127 mmol/L — ABNORMAL LOW (ref 135–145)

## 2021-01-24 LAB — GLUCOSE, CAPILLARY
Glucose-Capillary: 239 mg/dL — ABNORMAL HIGH (ref 70–99)
Glucose-Capillary: 333 mg/dL — ABNORMAL HIGH (ref 70–99)
Glucose-Capillary: 274 mg/dL — ABNORMAL HIGH (ref 70–99)
Glucose-Capillary: 114 mg/dL — ABNORMAL HIGH (ref 70–99)
Glucose-Capillary: 70 mg/dL (ref 70–99)

## 2021-01-24 LAB — MAGNESIUM: Magnesium: 2.3 mg/dL (ref 1.7–2.4)

## 2021-01-24 LAB — LACTIC ACID, PLASMA: Lactic Acid, Venous: 0.9 mmol/L (ref 0.5–1.9)

## 2021-01-24 LAB — PHOSPHORUS: Phosphorus: 2.1 mg/dL — ABNORMAL LOW (ref 2.5–4.6)

## 2021-01-24 MED ORDER — SPIRONOLACTONE 25 MG PO TABS
25.0000 mg | ORAL_TABLET | Freq: Every day | ORAL | Status: DC
  Administered 2021-01-24 – 2021-01-25 (×2): 25 mg via ORAL
  Filled 2021-01-24 (×2): qty 1

## 2021-01-24 MED ORDER — SACUBITRIL-VALSARTAN 24-26 MG PO TABS
1.0000 | ORAL_TABLET | Freq: Two times a day (BID) | ORAL | Status: DC
  Administered 2021-01-24 – 2021-01-25 (×3): 1 via ORAL
  Filled 2021-01-24 (×3): qty 1

## 2021-01-24 MED ORDER — DAPAGLIFLOZIN PROPANEDIOL 10 MG PO TABS
10.0000 mg | ORAL_TABLET | Freq: Every day | ORAL | Status: DC
  Administered 2021-01-24 – 2021-01-25 (×2): 10 mg via ORAL
  Filled 2021-01-24 (×3): qty 1

## 2021-01-24 NOTE — Progress Notes (Signed)
Rechecked BS at [phone]. Patient has been stable overnight VSS.

## 2021-01-24 NOTE — Progress Notes (Signed)
PROGRESS NOTE    Shawn [name]  XTK:[id number] DOB: 1955-07-15 DOA: 01/21/2021 PCP: [place]   Brief Narrative: This 66 years old male with PMH significant for essential hypertension, nonischemic cardiomyopathy, polysubstance abuse, history of stroke with no residual deficits, chronic combined systolic and diastolic heart failure sent from [facility] with altered mentation.  Patient was found to be confused since morning, he was found to have blood sugar of 65 and was given glucagon,  his blood sugar is improved but he still continued to remain confused.  Nursing home staff denied any falls or trauma,  head injury, nausea, vomiting, diarrhea.  Patient reports having increased urinary discomfort, pain and burning, denies any sick contacts,  fever or chills. Patient was admitted for acute metabolic encephalopathy found to be multifactorial from UTI, hyponatremia and dehydration.  Echocardiogram shows EF 40 to 45% which is in fact improved from prior EF of 25 to 30%.  Cardiology consulted,  recommended to continue current medications, resume heart failure medications when able to tolerate.  Assessment & Plan:   Principal Problem:   Acute metabolic encephalopathy Active Problems:   NICM (nonischemic cardiomyopathy) ([name])   Type 2 diabetes mellitus with stage 3b chronic kidney disease, with long-term current use of insulin ([name])   Polysubstance abuse ([name])   Alcohol abuse   Chronic systolic CHF (congestive heart failure) ([name])   Essential hypertension   GERD (gastroesophageal reflux disease)   HLD (hyperlipidemia)  Acute metabolic encephalopathy: > Improved. Suspect multifactorial, could be from hyponatremia, hypoglycemia, UTI, dehydration. Patient was found hypoglycemic, given glucagon,  blood sugar has improved. Patient reported increased urinary frequency and burning, continue IV ceftriaxone. UA is unremarkable.  Urine culture no growth.  Continue IV hydration. CT  head: No acute abnormality noted.  Chronic microvascular changes.   Hyponatremia: Could be secondary to dehydration. Continue gentle NS hydration. Continue to monitor serum sodium 130>131>127>127  Diabetes mellitus with hypoglycemia : Continue to monitor finger sticks. Blood sugar has improved and remains controlled. Overnight his blood sugar has dropped to 13 requiring D50. Discontinue Levemir, continue sliding scale.   Essential hypertension: Continue carvedilol. Resumed Entresto, spironolactone and torsemide.     CKD stage IIIa: Serum creatinine slightly up to 1.58.>1.00 Avoid nephrotoxic medications. Continue to monitor serum creatinine.   Dehydration: Resolved with IV hydration.   UTI: UA unremarkable but patient reports significant burning and frequency. Continue ceftriaxone for 3 days, urine culture no growth.   Chronic combined systolic and diastolic heart failure: Last echocardiogram LVEF 25 to 30%. Resumed Entresto, spironolactone, Farxiga and torsemide Repeat echocardiogram shows LVEF 40 to 45%.   Lactic acidosis: Could be secondary to dehydration, resolved with IV hydration. Sepsis ruled out.   DVT prophylaxis: Lovenox. Code Status: Full code. Family Communication: No family at bed side. Disposition Plan:   Status is: Inpatient  Remains inpatient appropriate because:  Admitted for altered mental status, found to have UTI requiring IV antibiotics.  PT and OT evaluation pending.   Consultants:   None  Procedures: None  Antimicrobials:   Anti-infectives (From admission, onward)    Start     Dose/Rate Route Frequency Ordered Stop   01/22/21 1000  cefTRIAXone (ROCEPHIN) 1 g in sodium chloride 0.9 % 100 mL IVPB        1 g 200 mL/hr over 30 Minutes Intravenous Every 24 hours 01/21/21 1234 01/24/21 0959   01/21/21 0900  cefTRIAXone (ROCEPHIN) 2 g in sodium chloride 0.9 % 100 mL IVPB  2 g 200 mL/hr over 30 Minutes Intravenous  Once 01/21/21 0845  01/21/21 1036        Subjective: Patient was seen and examined at bedside.  Overnight events noted.   He reports feeling much better.  He still feels very weak and tired. Overnight his blood sugar has dropped to 13 requiring D50. He denies any chest pain or shortness of breath.    Objective: Vitals:   01/23/21 0923 01/23/21 1306 01/23/21 2153 01/24/21 0632  BP: (!) 139/91 124/84 (!) 156/99 120/79  Pulse: 68 65 92 74  Resp:  16 20 17   Temp:  97.7 F (36.5 C) (!) 97.5 F (36.4 C) 98.6 F (37 C)  TempSrc:  Oral Oral   SpO2:  100% 98% 97%  Weight:      Height:        Intake/Output Summary (Last 24 hours) at 01/24/2021 1217 Last data filed at 01/24/2021 0929 Gross per 24 hour  Intake 1082.73 ml  Output 700 ml  Net 382.73 ml   Filed Weights   01/21/21 0823  Weight: 68 kg    Examination:  General exam: Appears comfortable, deconditioned, not in any acute distress. Respiratory system: Clear to auscultation bilaterally, respiratory effort normal, RR 12 Cardiovascular system: S1-S2 heard, regular rate and rhythm, no murmur. Gastrointestinal system: Abdomen is soft, nontender, nondistended, BS+ Central nervous system: Alert and oriented x 2. No focal neurological deficits. Extremities: No edema, no cyanosis, no clubbing. Skin: No rashes, lesions or ulcers Psychiatry: Judgement and insight appear normal. Mood & affect appropriate.     Data Reviewed: I have personally reviewed following labs and imaging studies  CBC: Recent Labs  Lab 01/21/21 0921 01/22/21 0613 01/23/21 0100 01/24/21 0543  WBC 8.5 6.1 5.4 5.4  NEUTROABS 7.3  --   --   --   HGB 12.6* 11.1* 10.2* 11.1*  HCT 38.1* 33.5* 31.5* 32.6*  MCV 96.0 95.7 95.7 94.2  PLT 506* 472* 484* 166   Basic Metabolic Panel: Recent Labs  Lab 01/21/21 0921 01/22/21 0613 01/23/21 0100 01/24/21 0543  NA 130* 131* 127* 127*  K 4.3 5.0 5.0 4.4  CL 93* 95* 92* 95*  CO2 26 24 22 26   GLUCOSE 144* 336* 517* 252*  BUN  38* 39* 51* 38*  CREATININE 1.04 1.09 1.58* 1.00  CALCIUM 8.6* 8.2* 8.2* 8.0*  MG 2.5*  --  2.5* 2.3  PHOS 2.6  --  4.4 2.1*   GFR: Estimated Creatinine Clearance: 69.8 mL/min (by C-G formula based on SCr of 1 mg/dL). Liver Function Tests: Recent Labs  Lab 01/21/21 0921 01/22/21 0613  AST 39 53*  ALT 40 37  ALKPHOS 214* 194*  BILITOT 0.7 1.0  PROT 6.7 5.7*  ALBUMIN 2.8* 2.4*   No results for input(s): LIPASE, AMYLASE in the last 168 hours. No results for input(s): AMMONIA in the last 168 hours. Coagulation Profile: Recent Labs  Lab 01/21/21 0921  INR 1.0   Cardiac Enzymes: No results for input(s): CKTOTAL, CKMB, CKMBINDEX, TROPONINI in the last 168 hours. BNP (last 3 results) No results for input(s): PROBNP in the last 8760 hours. HbA1C: No results for input(s): HGBA1C in the last 72 hours. CBG: Recent Labs  Lab 01/23/21 2236 01/23/21 2357 01/24/21 0453 01/24/21 0827 01/24/21 1208  GLUCAP 82 85 239* 333* 274*   Lipid Profile: No results for input(s): CHOL, HDL, LDLCALC, TRIG, CHOLHDL, LDLDIRECT in the last 72 hours. Thyroid Function Tests: No results for input(s): TSH, T4TOTAL, FREET4, T3FREE,  THYROIDAB in the last 72 hours.  Anemia Panel: No results for input(s): VITAMINB12, FOLATE, FERRITIN, TIBC, IRON, RETICCTPCT in the last 72 hours. Sepsis Labs: Recent Labs  Lab 01/21/21 0921 01/21/21 0946 01/21/21 1159 01/23/21 1333 01/24/21 0856  PROCALCITON 0.66  --   --   --   --   LATICACIDVEN  --  2.3* 1.4 3.2* 0.9    Recent Results (from the past 240 hour(s))  Resp Panel by RT-PCR (Flu A&B, Covid) Nasopharyngeal Swab     Status: None   Collection Time: 01/21/21  9:19 AM   Specimen: Nasopharyngeal Swab; Nasopharyngeal(NP) swabs in vial transport medium  Result Value Ref Range Status   SARS Coronavirus 2 by RT PCR NEGATIVE NEGATIVE Final    Comment: (NOTE) SARS-CoV-2 target nucleic acids are NOT DETECTED.  The SARS-CoV-2 RNA is generally detectable in  upper respiratory specimens during the acute phase of infection. The lowest concentration of SARS-CoV-2 viral copies this assay can detect is 138 copies/mL. A negative result does not preclude SARS-Cov-2 infection and should not be used as the sole basis for treatment or other patient management decisions. A negative result may occur with  improper specimen collection/handling, submission of specimen other than nasopharyngeal swab, presence of viral mutation(s) within the areas targeted by this assay, and inadequate number of viral copies(<138 copies/mL). A negative result must be combined with clinical observations, patient history, and epidemiological information. The expected result is Negative.  Fact Sheet for Patients:  EntrepreneurPulse.com.au  Fact Sheet for Healthcare Providers:  IncredibleEmployment.be  This test is no t yet approved or cleared by the [place] FDA and  has been authorized for detection and/or diagnosis of SARS-CoV-2 by FDA under an Emergency Use Authorization (EUA). This EUA will remain  in effect (meaning this test can be used) for the duration of the COVID-19 declaration under Section 564(b)(1) of the Act, 21 U.S.C.section 360bbb-3(b)(1), unless the authorization is terminated  or revoked sooner.       Influenza A by PCR NEGATIVE NEGATIVE Final   Influenza B by PCR NEGATIVE NEGATIVE Final    Comment: (NOTE) The Xpert Xpress SARS-CoV-2/FLU/RSV plus assay is intended as an aid in the diagnosis of influenza from Nasopharyngeal swab specimens and should not be used as a sole basis for treatment. Nasal washings and aspirates are unacceptable for Xpert Xpress SARS-CoV-2/FLU/RSV testing.  Fact Sheet for Patients: EntrepreneurPulse.com.au  Fact Sheet for Healthcare Providers: IncredibleEmployment.be  This test is not yet approved or cleared by the [place] FDA and has been  authorized for detection and/or diagnosis of SARS-CoV-2 by FDA under an Emergency Use Authorization (EUA). This EUA will remain in effect (meaning this test can be used) for the duration of the COVID-19 declaration under Section 564(b)(1) of the Act, 21 U.S.C. section 360bbb-3(b)(1), unless the authorization is terminated or revoked.  Performed at [facility], [address] [place], [place] [address]   Blood Culture (routine x 2)     Status: None (Preliminary result)   Collection Time: 01/21/21  9:47 AM   Specimen: BLOOD RIGHT WRIST  Result Value Ref Range Status   Specimen Description BLOOD RIGHT WRIST  Final   Special Requests   Final    BOTTLES DRAWN AEROBIC ONLY Blood Culture adequate volume   Culture   Final    NO GROWTH 3 DAYS Performed at [facility], [address], [place],  [address]    Report Status PENDING  Incomplete  Blood Culture (routine x 2)  Status: None (Preliminary result)   Collection Time: 01/21/21  9:47 AM   Specimen: BLOOD LEFT FOREARM  Result Value Ref Range Status   Specimen Description BLOOD LEFT FOREARM  Final   Special Requests   Final    BOTTLES DRAWN AEROBIC AND ANAEROBIC Blood Culture adequate volume   Culture   Final    NO GROWTH 3 DAYS Performed at [facility], [address], [place], [place] [address]    Report Status PENDING  Incomplete  Urine Culture     Status: None   Collection Time: 01/21/21 10:21 AM   Specimen: Urine, Catheterized  Result Value Ref Range Status   Specimen Description   Final    URINE, CATHETERIZED Performed at [facility], [address] [place], [place] [address]    Special Requests   Final    NONE Performed at [facility], [address] [place], [place] [address]    Culture   Final    NO GROWTH Performed at [facility], [place] [address] [place], [place] [address]    Report Status 01/22/2021 FINAL  Final    Radiology Studies: ECHOCARDIOGRAM COMPLETE  Result Date: 01/22/2021     ECHOCARDIOGRAM REPORT   Patient Name:   SHAMMOND ARAVE Date of Exam: 01/22/2021 Medical Rec #:  [id number]           Height:       67.0 in Accession #:    [id number]          Weight:       150.0 lb Date of Birth:  Aug 30, 1955            BSA:          1.790 m Patient Age:    14 years            BP:           100/65 mmHg Patient Gender: M                   HR:           75 bpm. Exam Location:  [facility] Procedure: 2D Echo, Cardiac Doppler and Color Doppler Indications:    Chest Pain  History:        Patient has prior history of Echocardiogram examinations, most                 recent 05/11/2018. Cardiomyopathy and CHF, Stroke; Risk                 Factors:Hypertension, Diabetes and Current Smoker.  Sonographer:    Wenda Low Referring Phys: [place]  1. Left ventricular ejection fraction, by estimation, is 40 to 45%. The left ventricle has mildly decreased function. The left ventricle demonstrates global hypokinesis. There is severe left ventricular hypertrophy. Left ventricular diastolic parameters  are consistent with Grade I diastolic dysfunction (impaired relaxation) Shiny speckled appearing myocardium with severe LVH, mild valve thickening, small pericardial effusion. Findings could suggest cardiac amyolidosis.  2. Right ventricular systolic function is normal. The right ventricular size is normal. Tricuspid regurgitation signal is inadequate for assessing PA pressure.  3. A small pericardial effusion is present. The pericardial effusion is circumferential.  4. The mitral valve is normal in structure. No evidence of mitral valve regurgitation. No evidence of mitral stenosis.  5. The aortic valve is tricuspid. There is mild calcification of the aortic valve. There is mild thickening of the aortic valve. Aortic valve regurgitation  is not visualized. No aortic stenosis is present.  6. The inferior vena cava is normal in size with greater than 50% respiratory variability, suggesting  right atrial pressure of 3 mmHg. FINDINGS  Left Ventricle: Shiny speckled appearing myocardium with severe LVH, mild valve thickening, small pericardial effusion. Findings could suggest cardiac amyolidosis. Left ventricular ejection fraction, by estimation, is 40 to 45%. The left ventricle has mildly decreased function. The left ventricle demonstrates global hypokinesis. The left ventricular internal cavity size was normal in size. There is severe left ventricular hypertrophy. Left ventricular diastolic parameters are consistent with Grade I diastolic dysfunction (impaired relaxation). Normal left ventricular filling pressure. Right Ventricle: The right ventricular size is normal. No increase in right ventricular wall thickness. Right ventricular systolic function is normal. Tricuspid regurgitation signal is inadequate for assessing PA pressure. Left Atrium: Left atrial size was normal in size. Right Atrium: Right atrial size was normal in size. Pericardium: A small pericardial effusion is present. The pericardial effusion is circumferential. Mitral Valve: The mitral valve is normal in structure. There is mild thickening of the mitral valve leaflet(s). There is mild calcification of the mitral valve leaflet(s). Mild mitral annular calcification. No evidence of mitral valve regurgitation. No evidence of mitral valve stenosis. MV peak gradient, 3.7 mmHg. The mean mitral valve gradient is 1.0 mmHg. Tricuspid Valve: The tricuspid valve is normal in structure. Tricuspid valve regurgitation is mild . No evidence of tricuspid stenosis. Aortic Valve: The aortic valve is tricuspid. There is mild calcification of the aortic valve. There is mild thickening of the aortic valve. There is mild aortic valve annular calcification. Aortic valve regurgitation is not visualized. No aortic stenosis  is present. Aortic valve mean gradient measures 3.0 mmHg. Aortic valve peak gradient measures 5.3 mmHg. Aortic valve area, by VTI measures  2.47 cm. Pulmonic Valve: The pulmonic valve was not well visualized. Pulmonic valve regurgitation is not visualized. No evidence of pulmonic stenosis. Aorta: The aortic root is normal in size and structure. Venous: The inferior vena cava is normal in size with greater than 50% respiratory variability, suggesting right atrial pressure of 3 mmHg. IAS/Shunts: The interatrial septum was not well visualized. Additional Comments: Mild ascites is present.  LEFT VENTRICLE PLAX 2D LVIDd:         4.60 cm      Diastology LVIDs:         3.80 cm      LV e' medial:    5.66 cm/s LV PW:         1.50 cm      LV E/e' medial:  9.1 LV IVS:        1.40 cm      LV e' lateral:   7.18 cm/s LVOT diam:     2.00 cm      LV E/e' lateral: 7.2 LV SV:         53 LV SV Index:   30 LVOT Area:     3.14 cm  LV Volumes (MOD) LV vol d, MOD A2C: 74.4 ml LV vol d, MOD A4C: 105.0 ml LV vol s, MOD A2C: 35.7 ml LV vol s, MOD A4C: 58.5 ml LV SV MOD A2C:     38.7 ml LV SV MOD A4C:     105.0 ml LV SV MOD BP:      40.1 ml RIGHT VENTRICLE RV Basal diam:  2.95 cm RV Mid diam:    2.30 cm RV S prime:     17.40 cm/s TAPSE (  M-mode): 2.4 cm LEFT ATRIUM             Index        RIGHT ATRIUM           Index LA diam:        3.70 cm 2.07 cm/m   RA Area:     14.60 cm LA Vol (A2C):   40.9 ml 22.86 ml/m  RA Volume:   30.60 ml  17.10 ml/m LA Vol (A4C):   27.7 ml 15.48 ml/m LA Biplane Vol: 35.1 ml 19.61 ml/m  AORTIC VALVE                    PULMONIC VALVE AV Area (Vmax):    2.76 cm     PV Vmax:       0.79 m/s AV Area (Vmean):   2.54 cm     PV Peak grad:  2.5 mmHg AV Area (VTI):     2.47 cm AV Vmax:           115.00 cm/s AV Vmean:          76.000 cm/s AV VTI:            0.216 m AV Peak Grad:      5.3 mmHg AV Mean Grad:      3.0 mmHg LVOT Vmax:         101.00 cm/s LVOT Vmean:        61.400 cm/s LVOT VTI:          0.170 m LVOT/AV VTI ratio: 0.79  AORTA Ao Root diam: 3.30 cm Ao Asc diam:  3.20 cm MITRAL VALVE MV Area (PHT): 5.58 cm    SHUNTS MV Area VTI:   2.62 cm     Systemic VTI:  0.17 m MV Peak grad:  3.7 mmHg    Systemic Diam: 2.00 cm MV Mean grad:  1.0 mmHg MV Vmax:       0.96 m/s MV Vmean:      49.1 cm/s MV Decel Time: 136 msec MV E velocity: 51.70 cm/s MV A velocity: 66.70 cm/s MV E/A ratio:  0.78 Carlyle Dolly MD Electronically signed by Carlyle Dolly MD Signature Date/Time: 01/22/2021/1:38:26 PM    Final     Scheduled Meds:  aspirin  81 mg Oral Daily   carvedilol  6.25 mg Oral BID WC   Chlorhexidine Gluconate Cloth  6 each Topical Daily   dapagliflozin propanediol  10 mg Oral QAC breakfast   docusate sodium  100 mg Oral BID   enoxaparin (LOVENOX) injection  40 mg Subcutaneous Daily   insulin aspart  0-5 Units Subcutaneous QHS   insulin aspart  0-9 Units Subcutaneous TID WC   insulin detemir  10 Units Subcutaneous Daily   pantoprazole  40 mg Oral Daily   sacubitril-valsartan  1 tablet Oral BID   sodium chloride  1 g Oral TID WC   spironolactone  25 mg Oral Daily   Continuous Infusions:     LOS: 2 days    Time spent: 35 mins    Demerius Podolak, MD [facility]   If 7PM-7AM, please contact night-coverage

## 2021-01-25 DIAGNOSIS — E860 Dehydration: Secondary | ICD-10-CM | POA: Diagnosis not present

## 2021-01-25 LAB — CBC
HCT: 32.3 % — ABNORMAL LOW (ref 39.0–52.0)
Hemoglobin: 10.7 g/dL — ABNORMAL LOW (ref 13.0–17.0)
MCH: 30.7 pg (ref 26.0–34.0)
MCHC: 33.1 g/dL (ref 30.0–36.0)
MCV: 92.6 fL (ref 80.0–100.0)
Platelets: 373 10*3/uL (ref 150–400)
RBC: 3.49 MIL/uL — ABNORMAL LOW (ref 4.22–5.81)
RDW: 14 % (ref 11.5–15.5)
WBC: 3.8 10*3/uL — ABNORMAL LOW (ref 4.0–10.5)
nRBC: 0 % (ref 0.0–0.2)

## 2021-01-25 LAB — MAGNESIUM: Magnesium: 2.4 mg/dL (ref 1.7–2.4)

## 2021-01-25 LAB — BASIC METABOLIC PANEL
Anion gap: 6 (ref 5–15)
BUN: 29 mg/dL — ABNORMAL HIGH (ref 8–23)
CO2: 25 mmol/L (ref 22–32)
Calcium: 8.4 mg/dL — ABNORMAL LOW (ref 8.9–10.3)
Chloride: 97 mmol/L — ABNORMAL LOW (ref 98–111)
Creatinine, Ser: 0.81 mg/dL (ref 0.61–1.24)
GFR, Estimated: >60 mL/min (ref >60)
Glucose, Bld: 185 mg/dL — ABNORMAL HIGH (ref 70–99)
Potassium: 4.3 mmol/L (ref 3.5–5.1)
Sodium: 128 mmol/L — ABNORMAL LOW (ref 135–145)

## 2021-01-25 LAB — GLUCOSE, CAPILLARY
Glucose-Capillary: 249 mg/dL — ABNORMAL HIGH (ref 70–99)
Glucose-Capillary: 184 mg/dL — ABNORMAL HIGH (ref 70–99)
Glucose-Capillary: 80 mg/dL (ref 70–99)

## 2021-01-25 LAB — PHOSPHORUS: Phosphorus: 1.5 mg/dL — ABNORMAL LOW (ref 2.5–4.6)

## 2021-01-25 MED ORDER — SODIUM PHOSPHATES 45 MMOLE/15ML IV SOLN
30.0000 mmol | Freq: Once | INTRAVENOUS | Status: AC
  Administered 2021-01-25: 30 mmol via INTRAVENOUS
  Filled 2021-01-25: qty 10

## 2021-01-25 MED ORDER — INFLUENZA VAC SPLIT QUAD 0.5 ML IM SUSY
0.5000 mL | PREFILLED_SYRINGE | INTRAMUSCULAR | Status: AC
  Administered 2021-01-25: 0.5 mL via INTRAMUSCULAR
  Filled 2021-01-25: qty 0.5

## 2021-01-25 MED ORDER — SODIUM CHLORIDE 1 G PO TABS: 1.0000 g | ORAL_TABLET | Freq: Three times a day (TID) | ORAL | 0 refills | Status: DC

## 2021-01-25 MED ORDER — INSULIN DETEMIR 100 UNIT/ML ~~LOC~~ SOLN: 7.0000 [IU] | Freq: Every day | SUBCUTANEOUS | 1 refills | Status: DC

## 2021-01-25 NOTE — Discharge Summary (Signed)
Physician Discharge Summary  Shawn [name] [name] DOB: [birth year] DOA: 01/21/2021  PCP: [facility]  Admit date: 01/21/2021  Discharge date: 01/25/2021  Admitted From: Home.  Disposition:  Home.  Recommendations for Outpatient Follow-up:  Follow up with PCP in 1-2 weeks. Please obtain BMP/CBC in one week. Advised to resume all home medications. Advised to reduce Levemir to 7 units daily with sliding scale. Advised to take sodium chloride tablets 3 times daily for 3 days.  Home Health: None Equipment/Devices: None  Discharge Condition: Stable CODE STATUS: Full code Diet recommendation: Heart Healthy   Brief Summary / Hospital Course: This 66 years old male with PMH significant for essential hypertension, nonischemic cardiomyopathy, polysubstance abuse, history of stroke with no residual deficits, chronic combined systolic and diastolic heart failure sent from [facility] with altered mentation.  Patient was found to be confused since morning, he was found to have blood sugar of 65 and was given glucagon,  his blood sugar has improved but he still continued to remain confused.  Nursing home staff denied any falls or trauma,  head injury, nausea, vomiting, diarrhea.  Patient reports having increased urinary discomfort, pain and burning, denies any sick contacts,  fever or chills. Patient was admitted for acute metabolic encephalopathy found to be multifactorial from UTI, hyponatremia and dehydration.  Echocardiogram showed EF 40 to 45% which is in fact improved from prior EF of 25 to 30%.  Cardiology consulted,  recommended to continue current medications, resume heart failure medications when able to tolerate.  Patient continued to remain alert and awake and following commands.  Altered mentation has resolved.  Patient completed 3 days of IV antibiotics.  Urine cultures no growth. Advised to reduce Levemir to 7 units given recurrent hypoglycemia.  Patient feels  better and want to be discharged.  Patient is being discharged home.  Home health services been arranged.     He was managed for below problems.   Discharge Diagnoses:  Principal Problem:   Acute metabolic encephalopathy Active Problems:   NICM (nonischemic cardiomyopathy) ([name])   Type 2 diabetes mellitus with stage 3b chronic kidney disease, with long-term current use of insulin ([name])   Polysubstance abuse ([name])   Alcohol abuse   Chronic systolic CHF (congestive heart failure) ([name])   Essential hypertension   GERD (gastroesophageal reflux disease)   HLD (hyperlipidemia)  Acute metabolic encephalopathy: > Improved. Suspect multifactorial, could be from hyponatremia, hypoglycemia, UTI, dehydration. Patient was found hypoglycemic, given glucagon,  blood sugar has improved. Patient reported increased urinary frequency and burning, continue IV ceftriaxone. UA is unremarkable.  Urine culture no growth.  Continue IV hydration. CT head: No acute abnormality noted.  Chronic microvascular changes.   Hyponatremia: Continue sodium chloride tablets TID x 2 days. Continue to monitor serum sodium 130>131>127>127 >128   Diabetes mellitus with hypoglycemia : Continue to monitor finger sticks. Blood sugar has improved and remains controlled. Reduce Levemir to 7 units , continue sliding scale.   Essential hypertension: Continue carvedilol. Resumed Entresto, spironolactone and torsemide.      CKD stage IIIa: Serum creatinine slightly up to 1.58.>1.00 Avoid nephrotoxic medications. Continue to monitor serum creatinine.   Dehydration: Resolved with IV hydration.   UTI: UA unremarkable but patient reports significant burning and frequency. Continue ceftriaxone for 3 days, urine culture no growth.   Chronic combined systolic and diastolic heart failure: Last echocardiogram LVEF 25 to 30%. Resumed Entresto, spironolactone, Farxiga and torsemide Repeat echocardiogram shows LVEF 40 to  45%.  Lactic acidosis: Could be secondary to dehydration, resolved with IV hydration. Sepsis ruled out.      Discharge Instructions  Discharge Instructions     Call MD for:  difficulty breathing, headache or visual disturbances   Complete by: As directed    Call MD for:  persistant dizziness or light-headedness   Complete by: As directed    Call MD for:  persistant nausea and vomiting   Complete by: As directed    Diet - low sodium heart healthy   Complete by: As directed    Diet Carb Modified   Complete by: As directed    Discharge instructions   Complete by: As directed    Advised to follow-up with primary care physician in 1 week. Advised to resume all home medications. Advised to reduce Levemir to 7 units daily with sliding scale. Advised to take sodium chloride tablets 3 times daily for 3 days.   Increase activity slowly   Complete by: As directed       Allergies as of 01/25/2021       Reactions   Pravastatin    Other reaction(s): Muscle pain   Simvastatin    Other reaction(s): Muscle pain        Medication List     TAKE these medications    acetaminophen 325 MG tablet Commonly known as: TYLENOL Take 2 tablets (650 mg total) by mouth every 6 (six) hours as needed for mild pain (or Fever >/= 101).   aspirin 81 MG chewable tablet Chew 1 tablet (81 mg total) by mouth daily.   B-Complex-C Tabs Take 1 tablet by mouth daily.   carvedilol 6.25 MG tablet Commonly known as: COREG Take 6.25 mg by mouth 2 (two) times daily with a meal.   dapagliflozin propanediol 10 MG Tabs tablet Commonly known as: Farxiga Take 1 tablet (10 mg total) by mouth daily before breakfast. MUST KEEP APPT FOR FURTHER REFILLS   Entresto 24-26 MG Generic drug: sacubitril-valsartan TAKE 1 TABLET BY MOUTH TWICE A DAY   feeding supplement (GLUCERNA SHAKE) Liqd Take 237 mLs by mouth 3 (three) times daily between meals.   insulin aspart 100 UNIT/ML injection Commonly known as:  novoLOG Inject 0-15 Units into the skin 3 (three) times daily with meals.   insulin detemir 100 UNIT/ML injection Commonly known as: LEVEMIR Inject 0.07 mLs (7 Units total) into the skin daily. What changed: how much to take   multivitamin with minerals Tabs tablet Take 1 tablet by mouth daily.   omeprazole 20 MG capsule Commonly known as: PRILOSEC Take 20 mg by mouth daily.   potassium chloride SA 20 MEQ tablet Commonly known as: KLOR-CON M Take 20 mEq by mouth daily.   sodium chloride 1 g tablet Take 1 tablet (1 g total) by mouth 3 (three) times daily with meals.   spironolactone 25 MG tablet Commonly known as: ALDACTONE Take 25 mg by mouth daily.   torsemide 20 MG tablet Commonly known as: DEMADEX Take 1 tablet (20 mg total) by mouth daily.   Vitamin D3 1.25 MG (50000 UT) Tabs Take 1 tablet by mouth once a week. [place] Follow up in 1 week(s).   Specialty: General Practice Contact information: [place] [address] [phone]         Minna Merritts, MD .   Specialty: Cardiology Contact information: [place]  [place] [address] [phone]                Allergies  Allergen Reactions   Pravastatin     Other reaction(s): Muscle pain   Simvastatin     Other reaction(s): Muscle pain    Consultations: None   Procedures/Studies: CT Head Wo Contrast  Result Date: 01/21/2021 CLINICAL DATA:  Dizziness EXAM: CT HEAD WITHOUT CONTRAST TECHNIQUE: Contiguous axial images were obtained from the base of the skull through the vertex without intravenous contrast. RADIATION DOSE REDUCTION: This exam was performed according to the departmental dose-optimization program which includes automated exposure control, adjustment of the mA and/or kV according to patient size and/or use of iterative reconstruction technique. COMPARISON:  CT head 01/05/2021 FINDINGS: Brain:  No acute intracranial hemorrhage, mass effect, or herniation. No extra-axial fluid collections. No evidence of acute territorial infarct. No hydrocephalus. Mild cortical volume loss. Mild patchy hypodensities in the periventricular and subcortical white matter, likely secondary to chronic microvascular ischemic changes. Vascular: No hyperdense vessel or unexpected calcification. Skull: Normal. Negative for fracture or focal lesion. Sinuses/Orbits: No acute finding. Other: None. IMPRESSION: Chronic changes with no acute intracranial process identified. Electronically Signed   By: Ofilia Neas M.D.   On: 01/21/2021 09:17   CT HEAD WO CONTRAST (5MM)  Result Date: 01/05/2021 CLINICAL DATA:  Altered mental status. EXAM: CT HEAD WITHOUT CONTRAST TECHNIQUE: Contiguous axial images were obtained from the base of the skull through the vertex without intravenous contrast. COMPARISON:  March 16, 2020. FINDINGS: Brain: No evidence of acute infarction, hemorrhage, hydrocephalus, extra-axial collection or mass lesion/mass effect. Vascular: No hyperdense vessel or unexpected calcification. Skull: Normal. Negative for fracture or focal lesion. Sinuses/Orbits: Right ethmoid sinusitis is noted. Other: None. IMPRESSION: No acute intracranial abnormality seen.  Right ethmoid sinusitis. Electronically Signed   By: Marijo Conception M.D.   On: 01/05/2021 15:44   DG Chest Port 1 View  Result Date: 01/21/2021 CLINICAL DATA:  Concern for sepsis EXAM: PORTABLE CHEST 1 VIEW COMPARISON:  Chest x-ray dated January 05, 2021 FINDINGS: Cardiac and mediastinal contours are unchanged. Mild interstitial opacities, likely due to underlying emphysema. No focal consolidation. No large pleural effusion or pneumothorax. IMPRESSION: No acute lung opacity. Electronically Signed   By: Yetta Glassman M.D.   On: 01/21/2021 09:35   DG Chest Port 1 View  Result Date: 01/05/2021 CLINICAL DATA:  Nausea. EXAM: PORTABLE CHEST 1 VIEW COMPARISON:  March 16, 2020 FINDINGS: The heart size and mediastinal contours are stable. The aorta is tortuous. Mild increased pulmonary interstitium is identified bilaterally. The lung volumes are low. There is no pleural effusion or focal pneumonia. The visualized skeletal structures are unremarkable. IMPRESSION: Mild interstitial edema. Electronically Signed   By: Abelardo Diesel M.D.   On: 01/05/2021 15:06   ECHOCARDIOGRAM COMPLETE  Result Date: 01/22/2021    ECHOCARDIOGRAM REPORT   Patient Name:   Shawn [name] Date of Exam: 01/22/2021 Medical Rec #:  [id number]           Height:       67.0 in Accession #:    [id number]          Weight:       150.0 lb Date of Birth:  04-23-1955            BSA:          1.790 m Patient Age:    57 years            BP:  100/65 mmHg Patient Gender: M                   HR:           75 bpm. Exam Location:  [facility] Procedure: 2D Echo, Cardiac Doppler and Color Doppler Indications:    Chest Pain  History:        Patient has prior history of Echocardiogram examinations, most                 recent 05/11/2018. Cardiomyopathy and CHF, Stroke; Risk                 Factors:Hypertension, Diabetes and Current Smoker.  Sonographer:    Wenda Low Referring Phys: [place]  1. Left ventricular ejection fraction, by estimation, is 40 to 45%. The left ventricle has mildly decreased function. The left ventricle demonstrates global hypokinesis. There is severe left ventricular hypertrophy. Left ventricular diastolic parameters  are consistent with Grade I diastolic dysfunction (impaired relaxation) Shiny speckled appearing myocardium with severe LVH, mild valve thickening, small pericardial effusion. Findings could suggest cardiac amyolidosis.  2. Right ventricular systolic function is normal. The right ventricular size is normal. Tricuspid regurgitation signal is inadequate for assessing PA pressure.  3. A small pericardial effusion is present. The pericardial effusion is  circumferential.  4. The mitral valve is normal in structure. No evidence of mitral valve regurgitation. No evidence of mitral stenosis.  5. The aortic valve is tricuspid. There is mild calcification of the aortic valve. There is mild thickening of the aortic valve. Aortic valve regurgitation is not visualized. No aortic stenosis is present.  6. The inferior vena cava is normal in size with greater than 50% respiratory variability, suggesting right atrial pressure of 3 mmHg. FINDINGS  Left Ventricle: Shiny speckled appearing myocardium with severe LVH, mild valve thickening, small pericardial effusion. Findings could suggest cardiac amyolidosis. Left ventricular ejection fraction, by estimation, is 40 to 45%. The left ventricle has mildly decreased function. The left ventricle demonstrates global hypokinesis. The left ventricular internal cavity size was normal in size. There is severe left ventricular hypertrophy. Left ventricular diastolic parameters are consistent with Grade I diastolic dysfunction (impaired relaxation). Normal left ventricular filling pressure. Right Ventricle: The right ventricular size is normal. No increase in right ventricular wall thickness. Right ventricular systolic function is normal. Tricuspid regurgitation signal is inadequate for assessing PA pressure. Left Atrium: Left atrial size was normal in size. Right Atrium: Right atrial size was normal in size. Pericardium: A small pericardial effusion is present. The pericardial effusion is circumferential. Mitral Valve: The mitral valve is normal in structure. There is mild thickening of the mitral valve leaflet(s). There is mild calcification of the mitral valve leaflet(s). Mild mitral annular calcification. No evidence of mitral valve regurgitation. No evidence of mitral valve stenosis. MV peak gradient, 3.7 mmHg. The mean mitral valve gradient is 1.0 mmHg. Tricuspid Valve: The tricuspid valve is normal in structure. Tricuspid valve  regurgitation is mild . No evidence of tricuspid stenosis. Aortic Valve: The aortic valve is tricuspid. There is mild calcification of the aortic valve. There is mild thickening of the aortic valve. There is mild aortic valve annular calcification. Aortic valve regurgitation is not visualized. No aortic stenosis  is present. Aortic valve mean gradient measures 3.0 mmHg. Aortic valve peak gradient measures 5.3 mmHg. Aortic valve area, by VTI measures 2.47 cm. Pulmonic Valve: The pulmonic valve was not well visualized. Pulmonic valve regurgitation is not  visualized. No evidence of pulmonic stenosis. Aorta: The aortic root is normal in size and structure. Venous: The inferior vena cava is normal in size with greater than 50% respiratory variability, suggesting right atrial pressure of 3 mmHg. IAS/Shunts: The interatrial septum was not well visualized. Additional Comments: Mild ascites is present.  LEFT VENTRICLE PLAX 2D LVIDd:         4.60 cm      Diastology LVIDs:         3.80 cm      LV e' medial:    5.66 cm/s LV PW:         1.50 cm      LV E/e' medial:  9.1 LV IVS:        1.40 cm      LV e' lateral:   7.18 cm/s LVOT diam:     2.00 cm      LV E/e' lateral: 7.2 LV SV:         53 LV SV Index:   30 LVOT Area:     3.14 cm  LV Volumes (MOD) LV vol d, MOD A2C: 74.4 ml LV vol d, MOD A4C: 105.0 ml LV vol s, MOD A2C: 35.7 ml LV vol s, MOD A4C: 58.5 ml LV SV MOD A2C:     38.7 ml LV SV MOD A4C:     105.0 ml LV SV MOD BP:      40.1 ml RIGHT VENTRICLE RV Basal diam:  2.95 cm RV Mid diam:    2.30 cm RV S prime:     17.40 cm/s TAPSE (M-mode): 2.4 cm LEFT ATRIUM             Index        RIGHT ATRIUM           Index LA diam:        3.70 cm 2.07 cm/m   RA Area:     14.60 cm LA Vol (A2C):   40.9 ml 22.86 ml/m  RA Volume:   30.60 ml  17.10 ml/m LA Vol (A4C):   27.7 ml 15.48 ml/m LA Biplane Vol: 35.1 ml 19.61 ml/m  AORTIC VALVE                    PULMONIC VALVE AV Area (Vmax):    2.76 cm     PV Vmax:       0.79 m/s AV Area  (Vmean):   2.54 cm     PV Peak grad:  2.5 mmHg AV Area (VTI):     2.47 cm AV Vmax:           115.00 cm/s AV Vmean:          76.000 cm/s AV VTI:            0.216 m AV Peak Grad:      5.3 mmHg AV Mean Grad:      3.0 mmHg LVOT Vmax:         101.00 cm/s LVOT Vmean:        61.400 cm/s LVOT VTI:          0.170 m LVOT/AV VTI ratio: 0.79  AORTA Ao Root diam: 3.30 cm Ao Asc diam:  3.20 cm MITRAL VALVE MV Area (PHT): 5.58 cm    SHUNTS MV Area VTI:   2.62 cm    Systemic VTI:  0.17 m MV Peak grad:  3.7 mmHg    Systemic Diam: 2.00 cm MV Mean grad:  1.0 mmHg MV Vmax:       0.96 m/s MV Vmean:      49.1 cm/s MV Decel Time: 136 msec MV E velocity: 51.70 cm/s MV A velocity: 66.70 cm/s MV E/A ratio:  0.78 Carlyle Dolly MD Electronically signed by Carlyle Dolly MD Signature Date/Time: 01/22/2021/1:38:26 PM    Final      Subjective: Patient was seen and examined at bedside.  Overnight events noted.  Patient reports feeling much improved.   Patient wants to be discharged.  Patient being discharged home. Home Health services been arranged.  Discharge Exam: Vitals:   01/24/21 2139 01/25/21 1424  BP: 123/88 103/71  Pulse: 78 73  Resp: 18 18  Temp: 98.7 F (37.1 C) 98.9 F (37.2 C)  SpO2: 100% 97%   Vitals:   01/24/21 0632 01/24/21 1422 01/24/21 2139 01/25/21 1424  BP: 120/79 90/68 123/88 103/71  Pulse: 74 73 78 73  Resp: 17 16 18 18   Temp: 98.6 F (37 C) 98.3 F (36.8 C) 98.7 F (37.1 C) 98.9 F (37.2 C)  TempSrc:  Oral Oral Oral  SpO2: 97% 98% 100% 97%  Weight:      Height:        General: Pt is alert, awake, not in acute distress Cardiovascular: RRR, S1/S2 +, no rubs, no gallops Respiratory: CTA bilaterally, no wheezing, no rhonchi Abdominal: Soft, NT, ND, bowel sounds + Extremities: no edema, no cyanosis    The results of significant diagnostics from this hospitalization (including imaging, microbiology, ancillary and laboratory) are listed below for reference.     Microbiology: Recent  Results (from the past 240 hour(s))  Resp Panel by RT-PCR (Flu A&B, Covid) Nasopharyngeal Swab     Status: None   Collection Time: 01/21/21  9:19 AM   Specimen: Nasopharyngeal Swab; Nasopharyngeal(NP) swabs in vial transport medium  Result Value Ref Range Status   SARS Coronavirus 2 by RT PCR NEGATIVE NEGATIVE Final    Comment: (NOTE) SARS-CoV-2 target nucleic acids are NOT DETECTED.  The SARS-CoV-2 RNA is generally detectable in upper respiratory specimens during the acute phase of infection. The lowest concentration of SARS-CoV-2 viral copies this assay can detect is 138 copies/mL. A negative result does not preclude SARS-Cov-2 infection and should not be used as the sole basis for treatment or other patient management decisions. A negative result may occur with  improper specimen collection/handling, submission of specimen other than nasopharyngeal swab, presence of viral mutation(s) within the areas targeted by this assay, and inadequate number of viral copies(<138 copies/mL). A negative result must be combined with clinical observations, patient history, and epidemiological information. The expected result is Negative.  Fact Sheet for Patients:  EntrepreneurPulse.com.au  Fact Sheet for Healthcare Providers:  IncredibleEmployment.be  This test is no t yet approved or cleared by the [place] FDA and  has been authorized for detection and/or diagnosis of SARS-CoV-2 by FDA under an Emergency Use Authorization (EUA). This EUA will remain  in effect (meaning this test can be used) for the duration of the COVID-19 declaration under Section 564(b)(1) of the Act, 21 U.S.C.section 360bbb-3(b)(1), unless the authorization is terminated  or revoked sooner.       Influenza A by PCR NEGATIVE NEGATIVE Final   Influenza B by PCR NEGATIVE NEGATIVE Final    Comment: (NOTE) The Xpert Xpress SARS-CoV-2/FLU/RSV plus assay is intended as an aid in the  diagnosis of influenza from Nasopharyngeal swab specimens and should not be used as a sole basis for treatment.  Nasal washings and aspirates are unacceptable for Xpert Xpress SARS-CoV-2/FLU/RSV testing.  Fact Sheet for Patients: EntrepreneurPulse.com.au  Fact Sheet for Healthcare Providers: IncredibleEmployment.be  This test is not yet approved or cleared by the [place] FDA and has been authorized for detection and/or diagnosis of SARS-CoV-2 by FDA under an Emergency Use Authorization (EUA). This EUA will remain in effect (meaning this test can be used) for the duration of the COVID-19 declaration under Section 564(b)(1) of the Act, 21 U.S.C. section 360bbb-3(b)(1), unless the authorization is terminated or revoked.  Performed at [facility], [address] [place], [place] [address]   Blood Culture (routine x 2)     Status: None (Preliminary result)   Collection Time: 01/21/21  9:47 AM   Specimen: BLOOD RIGHT WRIST  Result Value Ref Range Status   Specimen Description BLOOD RIGHT WRIST  Final   Special Requests   Final    BOTTLES DRAWN AEROBIC ONLY Blood Culture adequate volume   Culture   Final    NO GROWTH 4 DAYS Performed at [facility], [address], [place], [place] [address]    Report Status PENDING  Incomplete  Blood Culture (routine x 2)     Status: None (Preliminary result)   Collection Time: 01/21/21  9:47 AM   Specimen: BLOOD LEFT FOREARM  Result Value Ref Range Status   Specimen Description BLOOD LEFT FOREARM  Final   Special Requests   Final    BOTTLES DRAWN AEROBIC AND ANAEROBIC Blood Culture adequate volume   Culture   Final    NO GROWTH 4 DAYS Performed at [facility], [address], [place], [place] [address]    Report Status PENDING  Incomplete  Urine Culture     Status: None   Collection Time: 01/21/21 10:21 AM   Specimen: Urine, Catheterized  Result Value Ref Range Status   Specimen Description   Final     URINE, CATHETERIZED Performed at [facility], [address] [place], [place] [address]    Special Requests   Final    NONE Performed at [facility], [address], [place], [place] [address]    Culture   Final    NO GROWTH Performed at [facility], [place] [address], [place], [place] [address]    Report Status 01/22/2021 FINAL  Final     Labs: BNP (last 3 results) Recent Labs    03/16/20 1008  BNP 6,789.3*   Basic Metabolic Panel: Recent Labs  Lab 01/21/21 0921 01/22/21 0613 01/23/21 0100 01/24/21 0543 01/25/21 0549  NA 130* 131* 127* 127* 128*  K 4.3 5.0 5.0 4.4 4.3  CL 93* 95* 92* 95* 97*  CO2 26 24 22 26 25   GLUCOSE 144* 336* 517* 252* 185*  BUN 38* 39* 51* 38* 29*  CREATININE 1.04 1.09 1.58* 1.00 0.81  CALCIUM 8.6* 8.2* 8.2* 8.0* 8.4*  MG 2.5*  --  2.5* 2.3 2.4  PHOS 2.6  --  4.4 2.1* 1.5*   Liver Function Tests: Recent Labs  Lab 01/21/21 0921 01/22/21 0613  AST 39 53*  ALT 40 37  ALKPHOS 214* 194*  BILITOT 0.7 1.0  PROT 6.7 5.7*  ALBUMIN 2.8* 2.4*   No results for input(s): LIPASE, AMYLASE in the last 168 hours. No results for input(s): AMMONIA in the last 168 hours. CBC: Recent Labs  Lab 01/21/21 0921 01/22/21 0613 01/23/21 0100 01/24/21 0543 01/25/21 0549  WBC 8.5 6.1 5.4 5.4 3.8*  NEUTROABS 7.3  --   --   --   --  HGB 12.6* 11.1* 10.2* 11.1* 10.7*  HCT 38.1* 33.5* 31.5* 32.6* 32.3*  MCV 96.0 95.7 95.7 94.2 92.6  PLT 506* 472* 484* 399 373   Cardiac Enzymes: No results for input(s): CKTOTAL, CKMB, CKMBINDEX, TROPONINI in the last 168 hours. BNP: Invalid input(s): POCBNP CBG: Recent Labs  Lab 01/24/21 1208 01/24/21 1643 01/24/21 2040 01/25/21 0750 01/25/21 1135  GLUCAP 274* 114* 70 249* 184*   D-Dimer No results for input(s): DDIMER in the last 72 hours. Hgb A1c No results for input(s): HGBA1C in the last 72 hours. Lipid Profile No results for input(s): CHOL, HDL, LDLCALC, TRIG, CHOLHDL, LDLDIRECT in the last 72  hours. Thyroid function studies No results for input(s): TSH, T4TOTAL, T3FREE, THYROIDAB in the last 72 hours.  Invalid input(s): FREET3 Anemia work up No results for input(s): VITAMINB12, FOLATE, FERRITIN, TIBC, IRON, RETICCTPCT in the last 72 hours. Urinalysis    Component Value Date/Time   COLORURINE YELLOW 01/21/2021 1020   APPEARANCEUR CLEAR 01/21/2021 1020   LABSPEC 1.015 01/21/2021 1020   PHURINE 7.0 01/21/2021 1020   GLUCOSEU >=500 (A) 01/21/2021 1020   HGBUR MODERATE (A) 01/21/2021 1020   BILIRUBINUR NEGATIVE 01/21/2021 1020   KETONESUR NEGATIVE 01/21/2021 1020   PROTEINUR 100 (A) 01/21/2021 1020   NITRITE NEGATIVE 01/21/2021 1020   LEUKOCYTESUR NEGATIVE 01/21/2021 1020   Sepsis Labs Invalid input(s): PROCALCITONIN,  WBC,  LACTICIDVEN Microbiology Recent Results (from the past 240 hour(s))  Resp Panel by RT-PCR (Flu A&B, Covid) Nasopharyngeal Swab     Status: None   Collection Time: 01/21/21  9:19 AM   Specimen: Nasopharyngeal Swab; Nasopharyngeal(NP) swabs in vial transport medium  Result Value Ref Range Status   SARS Coronavirus 2 by RT PCR NEGATIVE NEGATIVE Final    Comment: (NOTE) SARS-CoV-2 target nucleic acids are NOT DETECTED.  The SARS-CoV-2 RNA is generally detectable in upper respiratory specimens during the acute phase of infection. The lowest concentration of SARS-CoV-2 viral copies this assay can detect is 138 copies/mL. A negative result does not preclude SARS-Cov-2 infection and should not be used as the sole basis for treatment or other patient management decisions. A negative result may occur with  improper specimen collection/handling, submission of specimen other than nasopharyngeal swab, presence of viral mutation(s) within the areas targeted by this assay, and inadequate number of viral copies(<138 copies/mL). A negative result must be combined with clinical observations, patient history, and epidemiological information. The expected result  is Negative.  Fact Sheet for Patients:  EntrepreneurPulse.com.au  Fact Sheet for Healthcare Providers:  IncredibleEmployment.be  This test is no t yet approved or cleared by the [place] FDA and  has been authorized for detection and/or diagnosis of SARS-CoV-2 by FDA under an Emergency Use Authorization (EUA). This EUA will remain  in effect (meaning this test can be used) for the duration of the COVID-19 declaration under Section 564(b)(1) of the Act, 21 U.S.C.section 360bbb-3(b)(1), unless the authorization is terminated  or revoked sooner.       Influenza A by PCR NEGATIVE NEGATIVE Final   Influenza B by PCR NEGATIVE NEGATIVE Final    Comment: (NOTE) The Xpert Xpress SARS-CoV-2/FLU/RSV plus assay is intended as an aid in the diagnosis of influenza from Nasopharyngeal swab specimens and should not be used as a sole basis for treatment. Nasal washings and aspirates are unacceptable for Xpert Xpress SARS-CoV-2/FLU/RSV testing.  Fact Sheet for Patients: EntrepreneurPulse.com.au  Fact Sheet for Healthcare Providers: IncredibleEmployment.be  This test is not yet approved or cleared by the  [place] Architectural technologist and has been authorized for detection and/or diagnosis of SARS-CoV-2 by FDA under an Print production planner (EUA). This EUA will remain in effect (meaning this test can be used) for the duration of the COVID-19 declaration under Section 564(b)(1) of the Act, 21 U.S.C. section 360bbb-3(b)(1), unless the authorization is terminated or revoked.  Performed at [facility], [address] [place], [place] [address]   Blood Culture (routine x 2)     Status: None (Preliminary result)   Collection Time: 01/21/21  9:47 AM   Specimen: BLOOD RIGHT WRIST  Result Value Ref Range Status   Specimen Description BLOOD RIGHT WRIST  Final   Special Requests   Final    BOTTLES DRAWN AEROBIC ONLY Blood Culture  adequate volume   Culture   Final    NO GROWTH 4 DAYS Performed at [facility], [address], [place], [place] [address]    Report Status PENDING  Incomplete  Blood Culture (routine x 2)     Status: None (Preliminary result)   Collection Time: 01/21/21  9:47 AM   Specimen: BLOOD LEFT FOREARM  Result Value Ref Range Status   Specimen Description BLOOD LEFT FOREARM  Final   Special Requests   Final    BOTTLES DRAWN AEROBIC AND ANAEROBIC Blood Culture adequate volume   Culture   Final    NO GROWTH 4 DAYS Performed at [facility], [address], [place], [place] [address]    Report Status PENDING  Incomplete  Urine Culture     Status: None   Collection Time: 01/21/21 10:21 AM   Specimen: Urine, Catheterized  Result Value Ref Range Status   Specimen Description   Final    URINE, CATHETERIZED Performed at [facility], [address], [place], [place] [address]    Special Requests   Final    NONE Performed at [facility], [address], [place], [place] [address]    Culture   Final    NO GROWTH Performed at [facility], [place] [address] [place], [place] [address]    Report Status 01/22/2021 FINAL  Final     Time coordinating discharge: Over 30 minutes  SIGNED:   Shawna Clamp, MD  [facility] 01/25/2021, 3:40 PM Pager   If 7PM-7AM, please contact night-coverage

## 2021-01-25 NOTE — Discharge Instructions (Signed)
Advised to follow-up with primary care physician in 1 week. Advised to resume all home medications. Advised to reduce Levemir to 7 units daily with sliding scale. Advised to take sodium chloride tablets 3 times daily for 3 days.

## 2021-01-26 ENCOUNTER — Telehealth: Payer: Self-pay

## 2021-01-26 LAB — CULTURE, BLOOD (ROUTINE X 2)
Culture: NO GROWTH
Culture: NO GROWTH
Special Requests: ADEQUATE
Special Requests: ADEQUATE

## 2021-01-26 NOTE — Telephone Encounter (Signed)
Transition Care Management Unsuccessful Follow-up Telephone Call  Date of discharge and from where:  01/25/2021-[facility]   Attempts:  1st Attempt  Reason for unsuccessful TCM follow-up call:  Unable to leave message

## 2021-01-27 NOTE — Telephone Encounter (Signed)
Not [facility] PCP.

## 2021-02-17 DIAGNOSIS — I130 Hypertensive heart and chronic kidney disease with heart failure and stage 1 through stage 4 chronic kidney disease, or unspecified chronic kidney disease: Secondary | ICD-10-CM | POA: Insufficient documentation

## 2021-02-17 DIAGNOSIS — M79671 Pain in right foot: Secondary | ICD-10-CM | POA: Insufficient documentation

## 2021-02-17 DIAGNOSIS — I509 Heart failure, unspecified: Secondary | ICD-10-CM | POA: Diagnosis not present

## 2021-02-17 DIAGNOSIS — L03116 Cellulitis of left lower limb: Secondary | ICD-10-CM | POA: Insufficient documentation

## 2021-02-17 DIAGNOSIS — N189 Chronic kidney disease, unspecified: Secondary | ICD-10-CM | POA: Insufficient documentation

## 2021-02-17 DIAGNOSIS — R7309 Other abnormal glucose: Secondary | ICD-10-CM | POA: Diagnosis not present

## 2021-02-17 DIAGNOSIS — M79672 Pain in left foot: Secondary | ICD-10-CM | POA: Diagnosis present

## 2021-02-17 LAB — COMPREHENSIVE METABOLIC PANEL
ALT: 30 U/L (ref 0–44)
AST: 35 U/L (ref 15–41)
Albumin: 2.8 g/dL — ABNORMAL LOW (ref 3.5–5.0)
Alkaline Phosphatase: 192 U/L — ABNORMAL HIGH (ref 38–126)
Anion gap: 12 (ref 5–15)
BUN: 36 mg/dL — ABNORMAL HIGH (ref 8–23)
CO2: 27 mmol/L (ref 22–32)
Calcium: 8.8 mg/dL — ABNORMAL LOW (ref 8.9–10.3)
Chloride: 96 mmol/L — ABNORMAL LOW (ref 98–111)
Creatinine, Ser: 0.99 mg/dL (ref 0.61–1.24)
GFR, Estimated: >60 mL/min (ref >60)
Glucose, Bld: 395 mg/dL — ABNORMAL HIGH (ref 70–99)
Potassium: 3.7 mmol/L (ref 3.5–5.1)
Sodium: 135 mmol/L (ref 135–145)
Total Bilirubin: 0.4 mg/dL (ref 0.3–1.2)
Total Protein: 6.3 g/dL — ABNORMAL LOW (ref 6.5–8.1)

## 2021-02-17 LAB — CBC WITH DIFFERENTIAL/PLATELET
Abs Immature Granulocytes: 0 10*3/uL (ref 0.00–0.07)
Basophils Absolute: 0 10*3/uL (ref 0.0–0.1)
Basophils Relative: 1 %
Eosinophils Absolute: 0.1 10*3/uL (ref 0.0–0.5)
Eosinophils Relative: 2 %
HCT: 33 % — ABNORMAL LOW (ref 39.0–52.0)
Hemoglobin: 10.6 g/dL — ABNORMAL LOW (ref 13.0–17.0)
Immature Granulocytes: 0 %
Lymphocytes Relative: 17 %
Lymphs Abs: 0.9 10*3/uL (ref 0.7–4.0)
MCH: 31.2 pg (ref 26.0–34.0)
MCHC: 32.1 g/dL (ref 30.0–36.0)
MCV: 97.1 fL (ref 80.0–100.0)
Monocytes Absolute: 0.5 10*3/uL (ref 0.1–1.0)
Monocytes Relative: 9 %
Neutro Abs: 3.7 10*3/uL (ref 1.7–7.7)
Neutrophils Relative %: 71 %
Platelets: 458 10*3/uL — ABNORMAL HIGH (ref 150–400)
RBC: 3.4 MIL/uL — ABNORMAL LOW (ref 4.22–5.81)
RDW: 15.5 % (ref 11.5–15.5)
WBC: 5.1 10*3/uL (ref 4.0–10.5)
nRBC: 0 % (ref 0.0–0.2)

## 2021-02-17 NOTE — ED Triage Notes (Signed)
Pt states he noticed that his feet were cracked and bleeding. Pt is diabetic and has neuropathy. States that he takes a fluid pill but that his feet are leaking and painful "stinging, Burning"

## 2021-02-18 ENCOUNTER — Emergency Department: Payer: No Typology Code available for payment source

## 2021-02-18 ENCOUNTER — Emergency Department
Admission: EM | Admit: 2021-02-18 | Discharge: 2021-02-18 | Disposition: A | Discharge status: Home / Self Care | Payer: No Typology Code available for payment source | Attending: Emergency Medicine | Admitting: Emergency Medicine

## 2021-02-18 DIAGNOSIS — M79672 Pain in left foot: Secondary | ICD-10-CM

## 2021-02-18 DIAGNOSIS — L03116 Cellulitis of left lower limb: Secondary | ICD-10-CM

## 2021-02-18 DIAGNOSIS — M79671 Pain in right foot: Secondary | ICD-10-CM

## 2021-02-18 LAB — CBG MONITORING, ED
Glucose-Capillary: 295 mg/dL — ABNORMAL HIGH (ref 70–99)
Glucose-Capillary: 230 mg/dL — ABNORMAL HIGH (ref 70–99)

## 2021-02-18 MED ORDER — CEPHALEXIN 500 MG PO CAPS
500.0000 mg | ORAL_CAPSULE | Freq: Once | ORAL | Status: AC
  Administered 2021-02-18: 500 mg via ORAL
  Filled 2021-02-18: qty 1

## 2021-02-18 MED ORDER — CEPHALEXIN 500 MG PO CAPS: 500.0000 mg | ORAL_CAPSULE | Freq: Three times a day (TID) | ORAL | 0 refills | Status: AC

## 2021-02-18 MED ORDER — INSULIN ASPART 100 UNIT/ML IJ SOLN
10.0000 [IU] | Freq: Once | INTRAMUSCULAR | Status: DC
  Filled 2021-02-18: qty 1

## 2021-02-18 MED ORDER — ACETAMINOPHEN 500 MG PO TABS
1000.0000 mg | ORAL_TABLET | Freq: Once | ORAL | Status: AC
  Administered 2021-02-18: 1000 mg via ORAL
  Filled 2021-02-18: qty 2

## 2021-02-18 MED ORDER — FUROSEMIDE 10 MG/ML IJ SOLN
80.0000 mg | Freq: Once | INTRAMUSCULAR | Status: AC
  Administered 2021-02-18: 80 mg via INTRAVENOUS
  Filled 2021-02-18: qty 8

## 2021-02-18 MED ORDER — OXYCODONE HCL 5 MG PO TABS
5.0000 mg | ORAL_TABLET | Freq: Once | ORAL | Status: AC
Start: 2021-02-18 — End: 2021-02-18
  Administered 2021-02-18: 5 mg via ORAL
  Filled 2021-02-18: qty 1

## 2021-02-18 MED ORDER — INSULIN ASPART 100 UNIT/ML IJ SOLN
5.0000 [IU] | Freq: Once | INTRAMUSCULAR | Status: AC
  Administered 2021-02-18: 5 [IU] via INTRAVENOUS

## 2021-02-18 NOTE — ED Notes (Signed)
Spoke w/ pt's sister per file info.  On the way to pick up this pt

## 2021-02-18 NOTE — ED Provider Notes (Signed)
[facility] Provider Note    Event Date/Time   First MD Initiated Contact with Patient 02/18/21 0030     (approximate)   History   Foot Pain   HPI  Shawn [name] is a 66 y.o. male who presents to the ED for evaluation of Foot Pain   I reviewed DC summary from 1/22 where patient was admitted from his SNF for a metabolic encephalopathy.  He has a history of nonischemic cardiomyopathy, polysubstance abuse, HTN, stroke without residual deficit and CHF.  CKD.  Patient presents to the ED for evaluation of weeping fluid and burning sensation to his bilateral feet.  He reports having neuropathy at baseline, but his feet are burning and stinging diffusely bilaterally more than normal.  He reports they look more swollen than typical despite his adherence to his diuretics.  He denies any chest pain, shortness of breath, falls, injuries or syncopal episodes.  Denies any nausea, fevers or feeling sick overall.  No recent antibiotics.  Physical Exam   Triage Vital Signs: ED Triage Vitals  Enc Vitals Group     BP 02/17/21 2009 (!) 158/92     Pulse Rate 02/17/21 2009 70     Resp 02/17/21 2009 17     Temp 02/17/21 2009 97.6 F (36.4 C)     Temp Source 02/17/21 2009 Oral     SpO2 02/17/21 2009 100 %     Weight --      Height --      Head Circumference --      Peak Flow --      Pain Score 02/17/21 2017 8     Pain Loc --      Pain Edu? --      Excl. in [place]? --     Most recent vital signs: Vitals:   02/18/21 0130 02/18/21 0230  BP: 131/71 131/82  Pulse: 68 72  Resp:  15  Temp:    SpO2: 100% 98%    General: Awake, no distress.  Pleasant and conversational in full sentences.  Lying flat. CV:  Good peripheral perfusion. RRR Resp:  Normal effort.  CTA B Abd:  No distention.  Soft and benign MSK:  Obviously edematous bilateral feet with pitting edema to the lower legs up to the knees.  Couple fluid-filled blisters, clear fluid without purulence.   Blister to the medial aspect of the right foot has slightly unroofed, causing 2 cm area of erythema pictured below. Between his left third and fourth toes, does have a diabetic wound Neuro:  No focal deficits appreciated. Other:         ED Results / Procedures / Treatments   Labs (all labs ordered are listed, but only abnormal results are displayed) Labs Reviewed  COMPREHENSIVE METABOLIC PANEL - Abnormal; Notable for the following components:      Result Value   Chloride 96 (*)    Glucose, Bld 395 (*)    BUN 36 (*)    Calcium 8.8 (*)    Total Protein 6.3 (*)    Albumin 2.8 (*)    Alkaline Phosphatase 192 (*)    All other components within normal limits  CBC WITH DIFFERENTIAL/PLATELET - Abnormal; Notable for the following components:   RBC 3.40 (*)    Hemoglobin 10.6 (*)    HCT 33.0 (*)    Platelets 458 (*)    All other components within normal limits  CBG MONITORING, ED - Abnormal; Notable for the following components:  Glucose-Capillary 295 (*)    All other components within normal limits  CBG MONITORING, ED - Abnormal; Notable for the following components:   Glucose-Capillary 230 (*)    All other components within normal limits    EKG   RADIOLOGY Plain films of the bilateral feet reviewed by me without evidence of bony injury or bony erosion to suggest osteomyelitis  Official radiology report(s): DG Foot 2 Views Left  Result Date: 02/18/2021 CLINICAL DATA:  Diabetes.  Evaluate for osteomyelitis. EXAM: LEFT FOOT - 2 VIEW COMPARISON:  Right foot radiograph dated 02/18/2021. FINDINGS: There is no acute fracture or dislocation. The bones are osteopenic. No bone erosion or periosteal elevation to suggest acute osteomyelitis. No significant arthritic changes. There is diffuse soft tissue swelling of the foot. No radiopaque foreign object or soft tissue gas. IMPRESSION: 1. No acute fracture or dislocation. No evidence of acute osteomyelitis by radiograph. 2. Soft tissue  swelling. Electronically Signed   By: Anner Crete M.D.   On: 02/18/2021 02:39   DG Foot 2 Views Right  Result Date: 02/18/2021 CLINICAL DATA:  Wound.  Evaluate for evidence of osteomyelitis. EXAM: RIGHT FOOT - 2 VIEW COMPARISON:  None. FINDINGS: There is no acute fracture or dislocation. No significant arthritic changes. No bone erosion or periosteal elevation. Diffuse soft tissue swelling of the foot. No radiopaque foreign object or soft tissue gas. IMPRESSION: 1. No acute osseous pathology. 2. Diffuse soft tissue swelling of the foot. Electronically Signed   By: Anner Crete M.D.   On: 02/18/2021 02:43    PROCEDURES and INTERVENTIONS:  .1-3 Lead EKG Interpretation Performed by: Vladimir Crofts, MD Authorized by: Vladimir Crofts, MD     Interpretation: normal     ECG rate:  70   ECG rate assessment: normal     Rhythm: sinus rhythm     Ectopy: none     Conduction: normal    Medications  furosemide (LASIX) injection 80 mg (80 mg Intravenous Given 02/18/21 0115)  acetaminophen (TYLENOL) tablet 1,000 mg (1,000 mg Oral Given 02/18/21 0108)  oxyCODONE (Oxy IR/ROXICODONE) immediate release tablet 5 mg (5 mg Oral Given 02/18/21 0108)  cephALEXin (KEFLEX) capsule 500 mg (500 mg Oral Given 02/18/21 0109)  insulin aspart (novoLOG) injection 5 Units (5 Units Intravenous Given 02/18/21 0121)     IMPRESSION / MDM / ASSESSMENT AND PLAN / ED COURSE  I reviewed the triage vital signs and the nursing notes.  66 year old male presents to the ED with acute on chronic bilateral foot pain, likely due to volume overload, possibly due to cellulitis to the left sided toes, ultimately suitable for outpatient management with the initiation of antibiotics.  A little hypertensive on arrival this resolved with analgesia and Lasix otherwise normal vitals on room air.  His feet are pictured above, mostly look volume overloaded.  Does have what appears to be a superficial/cellulitic wound between toes 2 and 3 on the  left.  Plain films of the feet do not show signs of trauma, bony injury such as osteomyelitis.  Blood work with hyperglycemia without acidosis to suggest DKA.  CBC without leukocytosis to suggest sepsis.  He was provided small dose of IV insulin with improvement of his hyperglycemia as well as IV Lasix with good urinary output and improvement of his swelling and pain.  Started the patient on a course of Keflex to address cellulitis.  I considered observation for this patient considering his medical complexity, hyperglycemia and signs of peripheral volume overload, but he ultimately  requested discharge and I think a trial of outpatient management is reasonable.  Return precautions for the ED discussed.  Clinical Course as of 02/18/21 0401  Wed Feb 18, 2021  0359 Reassessed.  Patient reports feeling a lot better.  We discussed volume overload and possible cellulitis between his toes, and few days of antibiotics to address this.  He agrees he has diuretics at home and we discussed adherence to them and preventing volume overload and CHF exacerbations.  We discussed return precautions for the ED. [DS]    Clinical Course User Index [DS] Vladimir Crofts, MD     FINAL CLINICAL IMPRESSION(S) / ED DIAGNOSES   Final diagnoses:  Foot pain, right  Foot pain, left  Cellulitis of left lower extremity     Rx / DC Orders   ED Discharge Orders          Ordered    cephALEXin (KEFLEX) 500 MG capsule  3 times daily        02/18/21 0401             Note:  This document was prepared using Dragon voice recognition software and may include unintentional dictation errors.   Vladimir Crofts, MD 02/18/21 [phone]

## 2021-02-19 ENCOUNTER — Telehealth: Payer: Self-pay

## 2021-02-19 NOTE — Telephone Encounter (Signed)
Transition Care Management Follow-up Telephone Call Date of discharge and from where: 02/18/2021-[facility] How have you been since you were released from the hospital? Pt stated he is ok but his foot is leaking fluid but he has someone coming to help him.  Any questions or concerns? No  Items Reviewed: Did the pt receive and understand the discharge instructions provided? Yes  Medications obtained and verified? Yes  Other? No  Any new allergies since your discharge? No  Dietary orders reviewed? No Do you have support at home? Yes   Home Care and Equipment/Supplies: Were home health services ordered? not applicable If so, what is the name of the agency? N/A  Has the agency set up a time to come to the patient's home? not applicable Were any new equipment or medical supplies ordered?  No What is the name of the medical supply agency? N/A Were you able to get the supplies/equipment? not applicable Do you have any questions related to the use of the equipment or supplies? No  Functional Questionnaire: (I = Independent and D = Dependent) ADLs: I  Bathing/Dressing- I  Meal Prep- I  Eating- I  Maintaining continence- I  Transferring/Ambulation- I  Managing Meds- I  Follow up appointments reviewed:  PCP Hospital f/u appt confirmed? No   Specialist Hospital f/u appt confirmed? Yes  Scheduled to see [facility] on 02/25/2021. Are transportation arrangements needed? No  If their condition worsens, is the pt aware to call PCP or go to the Emergency Dept.? Yes Was the patient provided with contact information for the PCP's office or ED? Yes Was to pt encouraged to call back with questions or concerns? Yes

## 2021-05-11 ENCOUNTER — Inpatient Hospital Stay
Admission: EM | Admit: 2021-05-11 | Discharge: 2021-05-14 | DRG: 637 | Disposition: A | Discharge status: Home Health | Payer: No Typology Code available for payment source | Attending: Hospitalist | Admitting: Hospitalist

## 2021-05-11 ENCOUNTER — Other Ambulatory Visit: Payer: Self-pay

## 2021-05-11 ENCOUNTER — Inpatient Hospital Stay: Payer: No Typology Code available for payment source

## 2021-05-11 DIAGNOSIS — E1122 Type 2 diabetes mellitus with diabetic chronic kidney disease: Secondary | ICD-10-CM | POA: Diagnosis present

## 2021-05-11 DIAGNOSIS — D75839 Thrombocytosis, unspecified: Secondary | ICD-10-CM | POA: Diagnosis present

## 2021-05-11 DIAGNOSIS — Z91148 Patient's other noncompliance with medication regimen for other reason: Secondary | ICD-10-CM

## 2021-05-11 DIAGNOSIS — E1110 Type 2 diabetes mellitus with ketoacidosis without coma: Principal | ICD-10-CM | POA: Diagnosis present

## 2021-05-11 DIAGNOSIS — N1832 Chronic kidney disease, stage 3b: Secondary | ICD-10-CM | POA: Diagnosis present

## 2021-05-11 DIAGNOSIS — F17200 Nicotine dependence, unspecified, uncomplicated: Secondary | ICD-10-CM | POA: Diagnosis present

## 2021-05-11 DIAGNOSIS — I5022 Chronic systolic (congestive) heart failure: Secondary | ICD-10-CM | POA: Diagnosis present

## 2021-05-11 DIAGNOSIS — N390 Urinary tract infection, site not specified: Secondary | ICD-10-CM | POA: Diagnosis present

## 2021-05-11 DIAGNOSIS — I130 Hypertensive heart and chronic kidney disease with heart failure and stage 1 through stage 4 chronic kidney disease, or unspecified chronic kidney disease: Secondary | ICD-10-CM | POA: Diagnosis present

## 2021-05-11 DIAGNOSIS — Z8249 Family history of ischemic heart disease and other diseases of the circulatory system: Secondary | ICD-10-CM | POA: Diagnosis not present

## 2021-05-11 DIAGNOSIS — Z8673 Personal history of transient ischemic attack (TIA), and cerebral infarction without residual deficits: Secondary | ICD-10-CM | POA: Diagnosis not present

## 2021-05-11 DIAGNOSIS — E43 Unspecified severe protein-calorie malnutrition: Secondary | ICD-10-CM | POA: Diagnosis present

## 2021-05-11 DIAGNOSIS — I639 Cerebral infarction, unspecified: Secondary | ICD-10-CM | POA: Diagnosis present

## 2021-05-11 DIAGNOSIS — E1010 Type 1 diabetes mellitus with ketoacidosis without coma: Secondary | ICD-10-CM

## 2021-05-11 DIAGNOSIS — I5042 Chronic combined systolic (congestive) and diastolic (congestive) heart failure: Secondary | ICD-10-CM | POA: Diagnosis present

## 2021-05-11 DIAGNOSIS — Z79899 Other long term (current) drug therapy: Secondary | ICD-10-CM | POA: Diagnosis not present

## 2021-05-11 DIAGNOSIS — I10 Essential (primary) hypertension: Secondary | ICD-10-CM | POA: Diagnosis present

## 2021-05-11 DIAGNOSIS — I428 Other cardiomyopathies: Secondary | ICD-10-CM | POA: Diagnosis present

## 2021-05-11 DIAGNOSIS — R5381 Other malaise: Secondary | ICD-10-CM | POA: Diagnosis not present

## 2021-05-11 DIAGNOSIS — B1920 Unspecified viral hepatitis C without hepatic coma: Secondary | ICD-10-CM | POA: Diagnosis present

## 2021-05-11 DIAGNOSIS — F1910 Other psychoactive substance abuse, uncomplicated: Secondary | ICD-10-CM | POA: Diagnosis present

## 2021-05-11 DIAGNOSIS — I5023 Acute on chronic systolic (congestive) heart failure: Secondary | ICD-10-CM | POA: Diagnosis present

## 2021-05-11 DIAGNOSIS — Z7982 Long term (current) use of aspirin: Secondary | ICD-10-CM

## 2021-05-11 DIAGNOSIS — Z794 Long term (current) use of insulin: Secondary | ICD-10-CM

## 2021-05-11 DIAGNOSIS — N179 Acute kidney failure, unspecified: Secondary | ICD-10-CM | POA: Diagnosis present

## 2021-05-11 DIAGNOSIS — E860 Dehydration: Secondary | ICD-10-CM | POA: Diagnosis present

## 2021-05-11 DIAGNOSIS — Z833 Family history of diabetes mellitus: Secondary | ICD-10-CM

## 2021-05-11 LAB — URINALYSIS, ROUTINE W REFLEX MICROSCOPIC
Bilirubin Urine: NEGATIVE
Glucose, UA: >=500 mg/dL — AB
Ketones, ur: 20 mg/dL — AB
Nitrite: NEGATIVE
Protein, ur: 30 mg/dL — AB
Specific Gravity, Urine: 1.016 (ref 1.005–1.030)
WBC, UA: >50 WBC/hpf — ABNORMAL HIGH (ref 0–5)
pH: 5 (ref 5.0–8.0)

## 2021-05-11 LAB — CBG MONITORING, ED
Glucose-Capillary: >600 mg/dL (ref 70–99)
Glucose-Capillary: >600 mg/dL (ref 70–99)
Glucose-Capillary: >600 mg/dL (ref 70–99)
Glucose-Capillary: 566 mg/dL (ref 70–99)
Glucose-Capillary: 575 mg/dL (ref 70–99)
Glucose-Capillary: 520 mg/dL (ref 70–99)
Glucose-Capillary: 440 mg/dL — ABNORMAL HIGH (ref 70–99)
Glucose-Capillary: 384 mg/dL — ABNORMAL HIGH (ref 70–99)
Glucose-Capillary: 309 mg/dL — ABNORMAL HIGH (ref 70–99)

## 2021-05-11 LAB — BASIC METABOLIC PANEL
Anion gap: 25 — ABNORMAL HIGH (ref 5–15)
Anion gap: 18 — ABNORMAL HIGH (ref 5–15)
Anion gap: 10 (ref 5–15)
BUN: 82 mg/dL — ABNORMAL HIGH (ref 8–23)
BUN: 75 mg/dL — ABNORMAL HIGH (ref 8–23)
BUN: 71 mg/dL — ABNORMAL HIGH (ref 8–23)
CO2: 12 mmol/L — ABNORMAL LOW (ref 22–32)
CO2: 19 mmol/L — ABNORMAL LOW (ref 22–32)
CO2: 24 mmol/L (ref 22–32)
Calcium: 9 mg/dL (ref 8.9–10.3)
Calcium: 9.1 mg/dL (ref 8.9–10.3)
Calcium: 9.5 mg/dL (ref 8.9–10.3)
Chloride: 88 mmol/L — ABNORMAL LOW (ref 98–111)
Chloride: 93 mmol/L — ABNORMAL LOW (ref 98–111)
Chloride: 97 mmol/L — ABNORMAL LOW (ref 98–111)
Creatinine, Ser: 3.5 mg/dL — ABNORMAL HIGH (ref 0.61–1.24)
Creatinine, Ser: 3.07 mg/dL — ABNORMAL HIGH (ref 0.61–1.24)
Creatinine, Ser: 2.79 mg/dL — ABNORMAL HIGH (ref 0.61–1.24)
GFR, Estimated: 19 mL/min — ABNORMAL LOW (ref >60)
GFR, Estimated: 22 mL/min — ABNORMAL LOW (ref >60)
GFR, Estimated: 25 mL/min — ABNORMAL LOW (ref >60)
Glucose, Bld: 1052 mg/dL (ref 70–99)
Glucose, Bld: 758 mg/dL (ref 70–99)
Glucose, Bld: 463 mg/dL — ABNORMAL HIGH (ref 70–99)
Potassium: 4.3 mmol/L (ref 3.5–5.1)
Potassium: 4.1 mmol/L (ref 3.5–5.1)
Potassium: 3.9 mmol/L (ref 3.5–5.1)
Sodium: 125 mmol/L — ABNORMAL LOW (ref 135–145)
Sodium: 130 mmol/L — ABNORMAL LOW (ref 135–145)
Sodium: 131 mmol/L — ABNORMAL LOW (ref 135–145)

## 2021-05-11 LAB — URINE DRUG SCREEN, QUALITATIVE (ARMC ONLY)
Amphetamines, Ur Screen: NOT DETECTED
Barbiturates, Ur Screen: NOT DETECTED
Benzodiazepine, Ur Scrn: NOT DETECTED
Cannabinoid 50 Ng, Ur ~~LOC~~: NOT DETECTED
Cocaine Metabolite,Ur ~~LOC~~: NOT DETECTED
MDMA (Ecstasy)Ur Screen: NOT DETECTED
Methadone Scn, Ur: NOT DETECTED
Opiate, Ur Screen: NOT DETECTED
Phencyclidine (PCP) Ur S: NOT DETECTED
Tricyclic, Ur Screen: NOT DETECTED

## 2021-05-11 LAB — BETA-HYDROXYBUTYRIC ACID: Beta-Hydroxybutyric Acid: 3.05 mmol/L — ABNORMAL HIGH (ref 0.05–0.27)

## 2021-05-11 LAB — CBC
HCT: 29.7 % — ABNORMAL LOW (ref 39.0–52.0)
Hemoglobin: 9.1 g/dL — ABNORMAL LOW (ref 13.0–17.0)
MCH: 28.9 pg (ref 26.0–34.0)
MCHC: 30.6 g/dL (ref 30.0–36.0)
MCV: 94.3 fL (ref 80.0–100.0)
Platelets: 570 10*3/uL — ABNORMAL HIGH (ref 150–400)
RBC: 3.15 MIL/uL — ABNORMAL LOW (ref 4.22–5.81)
RDW: 13.7 % (ref 11.5–15.5)
WBC: 6.1 10*3/uL (ref 4.0–10.5)
nRBC: 0 % (ref 0.0–0.2)

## 2021-05-11 LAB — BLOOD GAS, VENOUS
Acid-base deficit: 11.4 mmol/L — ABNORMAL HIGH (ref 0.0–2.0)
Bicarbonate: 14.2 mmol/L — ABNORMAL LOW (ref 20.0–28.0)
O2 Saturation: 96.8 %
Patient temperature: 37
pCO2, Ven: 31 mmHg — ABNORMAL LOW (ref 44–60)
pH, Ven: 7.27 (ref 7.25–7.43)
pO2, Ven: 76 mmHg — ABNORMAL HIGH (ref 32–45)

## 2021-05-11 MED ORDER — POTASSIUM CHLORIDE 2 MEQ/ML IV SOLN
INTRAVENOUS | Status: DC
  Filled 2021-05-11 (×5): qty 1000

## 2021-05-11 MED ORDER — CARVEDILOL 6.25 MG PO TABS
6.2500 mg | ORAL_TABLET | Freq: Two times a day (BID) | ORAL | Status: DC
  Administered 2021-05-12 – 2021-05-14 (×4): 6.25 mg via ORAL
  Filled 2021-05-11 (×4): qty 1

## 2021-05-11 MED ORDER — DEXTROSE 50 % IV SOLN: 0.0000 mL | INTRAVENOUS | Status: DC | PRN

## 2021-05-11 MED ORDER — INSULIN REGULAR(HUMAN) IN NACL 100-0.9 UT/100ML-% IV SOLN
INTRAVENOUS | Status: AC
  Administered 2021-05-12: 4.6 [IU]/h via INTRAVENOUS
  Filled 2021-05-11: qty 100

## 2021-05-11 MED ORDER — HEPARIN SODIUM (PORCINE) 5000 UNIT/ML IJ SOLN
5000.0000 [IU] | Freq: Three times a day (TID) | INTRAMUSCULAR | Status: DC
  Administered 2021-05-11 – 2021-05-14 (×5): 5000 [IU] via SUBCUTANEOUS
  Filled 2021-05-11 (×8): qty 1

## 2021-05-11 MED ORDER — DEXTROSE IN LACTATED RINGERS 5 % IV SOLN: INTRAVENOUS | Status: DC

## 2021-05-11 MED ORDER — POTASSIUM CHLORIDE 10 MEQ/100ML IV SOLN
10.0000 meq | INTRAVENOUS | Status: AC
  Administered 2021-05-11 (×2): 10 meq via INTRAVENOUS
  Filled 2021-05-11: qty 100

## 2021-05-11 MED ORDER — LACTATED RINGERS IV BOLUS
20.0000 mL/kg | Freq: Once | INTRAVENOUS | Status: AC
  Administered 2021-05-11: 1180 mL via INTRAVENOUS

## 2021-05-11 MED ORDER — INSULIN REGULAR(HUMAN) IN NACL 100-0.9 UT/100ML-% IV SOLN
INTRAVENOUS | Status: DC
  Administered 2021-05-11: 7.5 [IU]/h via INTRAVENOUS
  Filled 2021-05-11: qty 100

## 2021-05-11 MED ORDER — SODIUM CHLORIDE 0.9 % IV BOLUS
1000.0000 mL | Freq: Once | INTRAVENOUS | Status: AC
  Administered 2021-05-11: 1000 mL via INTRAVENOUS

## 2021-05-11 MED ORDER — SODIUM CHLORIDE 0.9 % IV SOLN
1.0000 g | Freq: Once | INTRAVENOUS | Status: AC
  Administered 2021-05-11: 1 g via INTRAVENOUS
  Filled 2021-05-11: qty 10

## 2021-05-11 MED ORDER — POTASSIUM CHLORIDE 10 MEQ/100ML IV SOLN
10.0000 meq | INTRAVENOUS | Status: AC
  Administered 2021-05-11: 10 meq via INTRAVENOUS

## 2021-05-11 MED ORDER — ASPIRIN 81 MG PO CHEW
81.0000 mg | CHEWABLE_TABLET | Freq: Every day | ORAL | Status: DC
  Administered 2021-05-12 – 2021-05-14 (×3): 81 mg via ORAL
  Filled 2021-05-11 (×3): qty 1

## 2021-05-11 MED ORDER — NICOTINE 7 MG/24HR TD PT24
7.0000 mg | MEDICATED_PATCH | Freq: Every day | TRANSDERMAL | Status: DC
  Administered 2021-05-11 – 2021-05-14 (×4): 7 mg via TRANSDERMAL
  Filled 2021-05-11 (×4): qty 1

## 2021-05-11 MED ORDER — ATORVASTATIN CALCIUM 20 MG PO TABS
40.0000 mg | ORAL_TABLET | Freq: Every day | ORAL | Status: DC
  Administered 2021-05-12 – 2021-05-14 (×3): 40 mg via ORAL
  Filled 2021-05-11 (×3): qty 2

## 2021-05-11 MED ORDER — SODIUM CHLORIDE 0.9 % IV SOLN
1.0000 g | INTRAVENOUS | Status: DC
  Administered 2021-05-12: 1 g via INTRAVENOUS
  Filled 2021-05-11: qty 10

## 2021-05-11 NOTE — Progress Notes (Addendum)
Inpatient Diabetes Program Recommendations ? ?AACE/ADA: New Consensus Statement on Inpatient Glycemic Control (2015) ? ?Target Ranges:  Prepandial:   less than 140 mg/dL ?     Peak postprandial:   less than 180 mg/dL (1-2 hours) ?     Critically ill patients:  140 - 180 mg/dL  ? ? Latest Reference Range & Units 05/11/21 12:00  ?Sodium 135 - 145 mmol/L 125 (L)  ?Potassium 3.5 - 5.1 mmol/L 4.3  ?Chloride 98 - 111 mmol/L 88 (L)  ?CO2 22 - 32 mmol/L 12 (L)  ?Glucose 70 - 99 mg/dL 1,052 (HH)  ?BUN 8 - 23 mg/dL 82 (H)  ?Creatinine 0.61 - 1.24 mg/dL 3.50 (H)  ?Calcium 8.9 - 10.3 mg/dL 9.0  ?Anion gap 5 - 15  25 (H)  ? ? ? ?Admit with: DKA ? ?History: DM, CHF, Polysubstance Abuse ? ?Home DM Meds: Farxiga 10 mg daily ?       Jardiance 12.5 mg daily ?       Novolog 6 units TID with meals ?       Semglee 25 units daily ?       ([facility]21) ? ?Current Orders: IV Insulin Drip ? ? ? ?Note IV Insulin Drip started at 1:28pm today ? ?Unsure of precipitating event? ? ?Most recent A1c level on file was 11.2% (taken 04/10/2021) (see [place] records from 05/06/2021) ? ?Has been counseled by the Inpatient Diabetes team March 2022 and January 2023 during these previous admissions ? ?Will follow ? ? ?--Will follow patient during hospitalization-- ? ?Wyn Quaker RN, MSN, CDE ?Diabetes Coordinator ?Inpatient Glycemic Control Team ?Team Pager: [phone] (8a-5p) ? ? ? ? ? ? ? ? ? ? ?

## 2021-05-11 NOTE — ED Notes (Signed)
Pt currently receiving IVF from EMS ?

## 2021-05-11 NOTE — ED Triage Notes (Signed)
Pt to ED [facility] from home for hyperglycemia. Per ems CBG >600. Received 800 NS with ems. VSS per EMS .  ?20g R FA EMS.  ?

## 2021-05-11 NOTE — H&P (Signed)
?History and Physical  ? ? ?Shawn [name]:[id number] DOB: 1955-11-29 DOA: 05/11/2021 ? ?[facility]P: [facility] [place]  ?Patient coming from: home ? ? ?Chief Complaint: poor appetite ? ?HPI: Shawn [name] is a 66 y.o. male with medical history significant for poorly controlled t2dm, ckd 3b, hfref, smoking, alcohol abuse, hypertension, prior CVA, malnutrition, who presents with the above. ? ?Patient reports several days of poor appetite. Has not been taking most of his medications including insulin. Denies abdominal or other pain. No headache. No fevers. Denies cough or shortness of breath. Denies chest pain. No diarrhea.  ? ?ED Course:  ? ?Labs consistent w/ hhs/dka overlap, with aki. Treated with 2 L IV bolus and started on insulin gtt. Patient reports feeling improved at the time of my evaluation. ? ? ?Review of Systems: As per HPI otherwise 10 point review of systems negative.  ? ? ?Past Medical History:  ?Diagnosis Date  ? Chronic combined systolic (congestive) and diastolic (congestive) heart failure ([name])   ? a. TTE 6/15: EF < 20%, mildly dilated LV, DD, mildly dilated LA, mod dilated RA, mild MR, mild to mod TR, mod increased posterior wall thickness, elevated LA and LVEDP; b. 4/12019 Echo: EF 20-25%, diff HK. Gr1 DD, nl RV fxn.  ? CKD (chronic kidney disease), stage II   ? Diabetes mellitus with complication ([place])   ? a. Prior admissions w/ DKA (last 12/2017).  ? Essential hypertension   ? NICM (nonischemic cardiomyopathy) ([place])   ? a. 06/2013 Echo: EF<20%; b. 06/2013 Cath: no signif dzs; c. 04/2017 Echo: 20-25%, gr1 DD.; d.05/2018 Echo: 25-30%  ? Polysubstance abuse ([place])   ? a. etoh and tobacco  ? Stroke [facility])   ? ? ?Past Surgical History:  ?Procedure Laterality Date  ? NO PAST SURGERIES    ? ? ? reports that he has been smoking cigarettes. He has been smoking an average of .5 packs per day. He has never used smokeless tobacco. He reports that he does not currently use alcohol. No history on file  for drug use. ? ?Allergies  ?Allergen Reactions  ? Pravastatin   ?  Other reaction(s): Muscle pain  ? Simvastatin   ?  Other reaction(s): Muscle pain  ? ? ?Family History  ?Problem Relation Age of Onset  ? Congestive Heart Failure Mother   ? Diabetes Mother   ? Congestive Heart Failure Brother   ? ? ?Prior to Admission medications   ?Medication Sig Start Date End Date Taking? Authorizing Provider  ?acetaminophen (TYLENOL) 325 MG tablet Take 2 tablets (650 mg total) by mouth every 6 (six) hours as needed for mild pain (or Fever >/= 101). 01/09/21   Barb Merino, MD  ?aspirin 81 MG chewable tablet Chew 1 tablet (81 mg total) by mouth daily. 04/30/17   Demetrios Loll, MD  ?atorvastatin (LIPITOR) 40 MG tablet TAKE ONE TABLET BY MOUTH AT BEDTIME FOR CHOLESTEROL 02/16/21   [provider]  ?B-Complex-C TABS Take 1 tablet by mouth daily.    [provider]  ?carvedilol (COREG) 6.25 MG tablet Take 6.25 mg by mouth 2 (two) times daily with a meal.    [provider]  ?Cholecalciferol (VITAMIN D3) 1.25 MG (50000 UT) TABS Take 1 tablet by mouth once a week. Mon    [provider]  ?dapagliflozin propanediol (FARXIGA) 10 MG TABS tablet Take 1 tablet (10 mg total) by mouth daily before breakfast. MUST KEEP APPT FOR FURTHER REFILLS 03/21/20   Alisa Graff,  FNP  ?empagliflozin (JARDIANCE) 25 MG TABS tablet TAKE ONE-HALF TABLET BY MOUTH EVERY MORNING FOR DIABETES AND HEART FAILURE 02/16/21   [provider]  ?[name] 24-26 MG TAKE 1 TABLET BY MOUTH TWICE A DAY 09/14/19   Alisa Graff, FNP  ?feeding supplement, GLUCERNA SHAKE, (GLUCERNA SHAKE) LIQD Take 237 mLs by mouth 3 (three) times daily between meals. 01/09/21   Barb Merino, MD  ?insulin aspart (NOVOLOG) 100 UNIT/ML injection Inject 0-15 Units into the skin 3 (three) times daily with meals. 01/09/21   Barb Merino, MD  ?insulin detemir (LEVEMIR) 100 UNIT/ML injection Inject 0.07 mLs (7 Units total) into the skin daily. 01/25/21    Shawna Clamp, MD  ?Multiple Vitamin (MULTIVITAMIN WITH MINERALS) TABS tablet Take 1 tablet by mouth daily. 01/10/21   Barb Merino, MD  ?omeprazole (PRILOSEC) 20 MG capsule Take 20 mg by mouth daily. 12/18/18   [provider]  ?potassium chloride SA (KLOR-CON M) 20 MEQ tablet Take 20 mEq by mouth daily.    [provider]  ?sodium chloride 1 g tablet Take 1 tablet (1 g total) by mouth 3 (three) times daily with meals. 01/25/21   Shawna Clamp, MD  ?spironolactone (ALDACTONE) 25 MG tablet Take 25 mg by mouth daily. 03/04/20   [provider]  ?torsemide (DEMADEX) 20 MG tablet Take 1 tablet (20 mg total) by mouth daily. 01/09/21 02/08/21  Barb Merino, MD  ? ? ?Physical Exam: ?Vitals:  ? 05/11/21 1159 05/11/21 1200 05/11/21 1201 05/11/21 1300  ?BP:   (!) 117/58 128/82  ?Pulse:   66 64  ?Resp: 18   16  ?Temp:   97.7 ?F (36.5 ?C) 97.8 ?F (36.6 ?C)  ?TempSrc:   Oral Oral  ?SpO2:   97% 100%  ?Weight:  59 kg    ?Height:  5\' 7"  (1.702 m)    ? ? ?Constitutional: No acute distress, chronically ill appearing ?Head: Atraumatic ?Eyes: Conjunctiva clear ?ENM: dry mucous membranes. Normal dentition.  ?Neck: Supple ?Respiratory: Clear to auscultation bilaterally, no wheezing/rales/rhonchi. Normal respiratory effort. No accessory muscle use. Marland Kitchen ?Cardiovascular: Regular rate and rhythm. Soft systolic murmur ?Abdomen: Non-tender, mildly distended. No masses. No rebound or guarding. Positive bowel sounds. ?Musculoskeletal: No joint deformity upper and lower extremities. Normal ROM, no contractures. decreased muscle tone.  ?Skin: superficial ulcer dosrum of left foot no surrounding erythema/edema ?Extremities: No peripheral edema. Palpable peripheral pulses. ?Neurologic: Alert, moving all 4 extremities. ?Psychiatric: Normal insight and judgement. ? ? ?Labs on Admission: I have personally reviewed following labs and imaging studies ? ?CBC: ?Recent Labs  ?Lab 05/11/21 ?1200  ?WBC 6.1  ?HGB 9.1*  ?HCT 29.7*  ?MCV  94.3  ?PLT 570*  ? ?Basic Metabolic Panel: ?Recent Labs  ?Lab 05/11/21 ?1200  ?NA 125*  ?K 4.3  ?CL 88*  ?CO2 12*  ?GLUCOSE 1,052*  ?BUN 82*  ?CREATININE 3.50*  ?CALCIUM 9.0  ? ?GFR: ?Estimated Creatinine Clearance: 17.8 mL/min (A) (by C-G formula based on SCr of 3.5 mg/dL (H)). ?Liver Function Tests: ?No results for input(s): AST, ALT, ALKPHOS, BILITOT, PROT, ALBUMIN in the last 168 hours. ?No results for input(s): LIPASE, AMYLASE in the last 168 hours. ?No results for input(s): AMMONIA in the last 168 hours. ?Coagulation Profile: ?No results for input(s): INR, PROTIME in the last 168 hours. ?Cardiac Enzymes: ?No results for input(s): CKTOTAL, CKMB, CKMBINDEX, TROPONINI in the last 168 hours. ?BNP (last 3 results) ?No results for input(s): PROBNP in the last 8760 hours. ?HbA1C: ?No results for input(s):  HGBA1C in the last 72 hours. ?CBG: ?Recent Labs  ?Lab 05/11/21 ?1204  ?GLUCAP >600*  ? ?Lipid Profile: ?No results for input(s): CHOL, HDL, LDLCALC, TRIG, CHOLHDL, LDLDIRECT in the last 72 hours. ?Thyroid Function Tests: ?No results for input(s): TSH, T4TOTAL, FREET4, T3FREE, THYROIDAB in the last 72 hours. ?Anemia Panel: ?No results for input(s): VITAMINB12, FOLATE, FERRITIN, TIBC, IRON, RETICCTPCT in the last 72 hours. ?Urine analysis: ?   ?Component Value Date/Time  ? COLORURINE YELLOW (A) 05/11/2021 1200  ? APPEARANCEUR CLOUDY (A) 05/11/2021 1200  ? LABSPEC 1.016 05/11/2021 1200  ? PHURINE 5.0 05/11/2021 1200  ? GLUCOSEU >=500 (A) 05/11/2021 1200  ? HGBUR SMALL (A) 05/11/2021 1200  ? BILIRUBINUR NEGATIVE 05/11/2021 1200  ? KETONESUR 20 (A) 05/11/2021 1200  ? PROTEINUR 30 (A) 05/11/2021 1200  ? NITRITE NEGATIVE 05/11/2021 1200  ? LEUKOCYTESUR LARGE (A) 05/11/2021 1200  ? ? ?Radiological Exams on Admission: ?No results found. ? ? ?Assessment/Plan ?Principal Problem: ?  DKA (diabetic ketoacidosis) ([place]) ?Active Problems: ?  Chronic systolic CHF (congestive heart failure) ([place]) ?  NICM (nonischemic cardiomyopathy)  ([place]) ?  Smoker ?  Polysubstance abuse ([place]) ?  Essential hypertension ?  AKI (acute kidney injury) ([place]) ?  Protein-calorie malnutrition, severe ([place]) ?  Stroke [facility]) ?  ?# DKA ?Several days of malaise and p

## 2021-05-11 NOTE — ED Notes (Signed)
Cbg reads HI ?

## 2021-05-11 NOTE — ED Notes (Signed)
Lab called to say BMP had to be redrawn, Light green top sent to lab ?

## 2021-05-11 NOTE — ED Provider Notes (Signed)
? ?[facility] ?Provider Note ? ? ? Event Date/Time  ? First MD Initiated Contact with Patient 05/11/21 1248   ?  (approximate) ? ?History  ? ?Chief Complaint: Hyperglycemia ? ?HPI ? ?Shawn [name] is a 66 y.o. male with a past medical history of CHF, diabetes, polysubstance abuse, presents to the emergency department for high blood sugar and generalized weakness.  Cording to the patient 2 to 3 days ago he began feeling very weak and fatigued.  States he has not been able to eat or drink anything recently.  States he was taking insulin twice daily but he has not been taking insulin while he has not been eating.  EMS states upon arrival patient has soiled himself and urinated on himself.  Blood glucose greater than 600 on fingerstick patient has received 800 cc of normal saline by EMS prior to arrival.  Patient denies any abdominal pain denies any vomiting just states no appetite. ? ?Physical Exam  ? ?Triage Vital Signs: ?ED Triage Vitals  ?Enc Vitals Group  ?   BP 05/11/21 1201 (!) 117/58  ?   Pulse Rate 05/11/21 1201 66  ?   Resp 05/11/21 1159 18  ?   Temp 05/11/21 1201 97.7 ?F (36.5 ?C)  ?   Temp Source 05/11/21 1201 Oral  ?   SpO2 05/11/21 1201 97 %  ?   Weight 05/11/21 1200 130 lb (59 kg)  ?   Height 05/11/21 1200 5\' 7"  (1.702 m)  ?   Head Circumference --   ?   Peak Flow --   ?   Pain Score 05/11/21 1200 0  ?   Pain Loc --   ?   Pain Edu? --   ?   Excl. in [place]? --   ? ? ?Most recent vital signs: ?Vitals:  ? 05/11/21 1159 05/11/21 1201  ?BP:  (!) 117/58  ?Pulse:  66  ?Resp: 18   ?Temp:  97.7 ?F (36.5 ?C)  ?SpO2:  97%  ? ? ?General: Awake, no distress.  ?CV:  Good peripheral perfusion.  Regular rate and rhythm  ?Resp:  Normal effort.  Equal breath sounds bilaterally.  ?Abd:  No distention.  Soft, nontender.  No rebound or guarding. ? ? ?ED Results / Procedures / Treatments  ? ?MEDICATIONS ORDERED IN ED: ?Medications  ?sodium chloride 0.9 % bolus 1,000 mL (has no administration in time  range)  ?lactated ringers bolus 1,180 mL (has no administration in time range)  ?insulin regular, human (MYXREDLIN) 100 units/ 100 mL infusion (has no administration in time range)  ?dextrose 5 % in lactated ringers infusion (has no administration in time range)  ?dextrose 50 % solution 0-50 mL (has no administration in time range)  ?potassium chloride 10 mEq in 100 mL IVPB (has no administration in time range)  ? ? ? ?IMPRESSION / MDM / ASSESSMENT AND PLAN / ED COURSE  ?I reviewed the triage vital signs and the nursing notes. ? ?Patient presents emergency department for generalized weakness fatigue patient has urinated on himself states he has not been eating or drinking.  Patient's lab work shows significant hyperglycemia with a glucose of 1052 with pseudohyponatremia and renal insufficiency.  Patient's urinalysis also shows greater than 50 white cells with white blood cell clumps consistent with urinary tract infection we will send urine culture and begin dosing IV Rocephin.  CBC shows a hemoglobin of 9.1 not significantly changed from historical values.  Normal WBC.  We will obtain  a VBG.  Given the patient's significant hyperglycemia with an elevated anion gap and a low bicarb most consistent with DKA we will start the patient on insulin infusion, we will begin IV hydration we will treat with antibiotics for the urinary tract infection.  Patient will require admission to the hospital service once his ER work-up is been completed. ? ?VBG pH is 7.27.  Patient on a insulin drip.  Patient admitted to the hospitalist service for further work-up and treatment. ? ?CRITICAL CARE ?Performed by: Harvest Dark ? ? ?Total critical care time: 30 minutes ? ?Critical care time was exclusive of separately billable procedures and treating other patients. ? ?Critical care was necessary to treat or prevent imminent or life-threatening deterioration. ? ?Critical care was time spent personally by me on the following activities:  development of treatment plan with patient and/or surrogate as well as nursing, discussions with consultants, evaluation of patient's response to treatment, examination of patient, obtaining history from patient or surrogate, ordering and performing treatments and interventions, ordering and review of laboratory studies, ordering and review of radiographic studies, pulse oximetry and re-evaluation of patient's condition. ? ?FINAL CLINICAL IMPRESSION(S) / ED DIAGNOSES  ? ?Diabetic ketoacidosis ?Urinary tract infection ? ?Note:  This document was prepared using Dragon voice recognition software and may include unintentional dictation errors. ?  ?Harvest Dark, MD ?05/11/21 1351 ? ?

## 2021-05-12 ENCOUNTER — Encounter: Payer: Self-pay | Admitting: Internal Medicine

## 2021-05-12 DIAGNOSIS — E1110 Type 2 diabetes mellitus with ketoacidosis without coma: Principal | ICD-10-CM

## 2021-05-12 DIAGNOSIS — R5381 Other malaise: Secondary | ICD-10-CM

## 2021-05-12 DIAGNOSIS — N179 Acute kidney failure, unspecified: Secondary | ICD-10-CM

## 2021-05-12 LAB — GLUCOSE, CAPILLARY
Glucose-Capillary: 99 mg/dL (ref 70–99)
Glucose-Capillary: 154 mg/dL — ABNORMAL HIGH (ref 70–99)

## 2021-05-12 LAB — BETA-HYDROXYBUTYRIC ACID
Beta-Hydroxybutyric Acid: 0.06 mmol/L (ref 0.05–0.27)
Beta-Hydroxybutyric Acid: 0.08 mmol/L (ref 0.05–0.27)

## 2021-05-12 LAB — BASIC METABOLIC PANEL
Anion gap: 8 (ref 5–15)
Anion gap: 7 (ref 5–15)
BUN: 69 mg/dL — ABNORMAL HIGH (ref 8–23)
BUN: 61 mg/dL — ABNORMAL HIGH (ref 8–23)
CO2: 27 mmol/L (ref 22–32)
CO2: 25 mmol/L (ref 22–32)
Calcium: 9.4 mg/dL (ref 8.9–10.3)
Calcium: 9 mg/dL (ref 8.9–10.3)
Chloride: 101 mmol/L (ref 98–111)
Chloride: 104 mmol/L (ref 98–111)
Creatinine, Ser: 2.39 mg/dL — ABNORMAL HIGH (ref 0.61–1.24)
Creatinine, Ser: 2.18 mg/dL — ABNORMAL HIGH (ref 0.61–1.24)
GFR, Estimated: 30 mL/min — ABNORMAL LOW (ref >60)
GFR, Estimated: 33 mL/min — ABNORMAL LOW (ref >60)
Glucose, Bld: 151 mg/dL — ABNORMAL HIGH (ref 70–99)
Glucose, Bld: 163 mg/dL — ABNORMAL HIGH (ref 70–99)
Potassium: 4.2 mmol/L (ref 3.5–5.1)
Potassium: 4.2 mmol/L (ref 3.5–5.1)
Sodium: 136 mmol/L (ref 135–145)
Sodium: 136 mmol/L (ref 135–145)

## 2021-05-12 LAB — HEMOGLOBIN A1C
Hgb A1c MFr Bld: 12.7 % — ABNORMAL HIGH (ref 4.8–5.6)
Mean Plasma Glucose: 318 mg/dL

## 2021-05-12 LAB — URINE CULTURE

## 2021-05-12 LAB — CBG MONITORING, ED
Glucose-Capillary: 122 mg/dL — ABNORMAL HIGH (ref 70–99)
Glucose-Capillary: 133 mg/dL — ABNORMAL HIGH (ref 70–99)
Glucose-Capillary: 134 mg/dL — ABNORMAL HIGH (ref 70–99)
Glucose-Capillary: 150 mg/dL — ABNORMAL HIGH (ref 70–99)
Glucose-Capillary: 166 mg/dL — ABNORMAL HIGH (ref 70–99)
Glucose-Capillary: 182 mg/dL — ABNORMAL HIGH (ref 70–99)
Glucose-Capillary: 244 mg/dL — ABNORMAL HIGH (ref 70–99)
Glucose-Capillary: 248 mg/dL — ABNORMAL HIGH (ref 70–99)

## 2021-05-12 MED ORDER — INSULIN ASPART 100 UNIT/ML IJ SOLN
0.0000 [IU] | Freq: Three times a day (TID) | INTRAMUSCULAR | Status: DC
  Administered 2021-05-12: 5 [IU] via SUBCUTANEOUS
  Administered 2021-05-13: 15 [IU] via SUBCUTANEOUS
  Administered 2021-05-13: 11 [IU] via SUBCUTANEOUS
  Administered 2021-05-13: 5 [IU] via SUBCUTANEOUS
  Administered 2021-05-14: 2 [IU] via SUBCUTANEOUS
  Filled 2021-05-12 (×5): qty 1

## 2021-05-12 MED ORDER — INSULIN DETEMIR 100 UNIT/ML ~~LOC~~ SOLN
7.0000 [IU] | SUBCUTANEOUS | Status: DC
  Administered 2021-05-12: 7 [IU] via SUBCUTANEOUS
  Filled 2021-05-12 (×2): qty 0.07

## 2021-05-12 MED ORDER — SODIUM CHLORIDE 0.9 % IV BOLUS
1000.0000 mL | Freq: Once | INTRAVENOUS | Status: AC
  Administered 2021-05-12: 1000 mL via INTRAVENOUS

## 2021-05-12 MED ORDER — LACTATED RINGERS IV SOLN: INTRAVENOUS | Status: DC

## 2021-05-12 MED ORDER — INSULIN ASPART 100 UNIT/ML IJ SOLN: 0.0000 [IU] | Freq: Every day | INTRAMUSCULAR | Status: DC

## 2021-05-12 MED ORDER — INSULIN ASPART 100 UNIT/ML IJ SOLN
4.0000 [IU] | Freq: Three times a day (TID) | INTRAMUSCULAR | Status: DC
  Administered 2021-05-12 – 2021-05-14 (×5): 4 [IU] via SUBCUTANEOUS
  Filled 2021-05-12 (×5): qty 1

## 2021-05-12 NOTE — Progress Notes (Signed)
?   05/12/21 1600  ?Clinical Encounter Type  ?Visited With Patient  ?Visit Type Initial  ?Referral From Nurse  ? ?Chaplain responded to request for Advance Directive. Paperwork given and procedure explained.  ?

## 2021-05-12 NOTE — Progress Notes (Signed)
? ?      CROSS COVER NOTE ? ?NAME: Shawn [name] ?MRN: [id number] ?DOB : 05/02/1955 ? ? ? ?Notified by nursing they received ENDOTOOL alert that patient is to be transitioned off of insulin infusion. ? ?Chart reviewed.  Gap is closed and beta-hydroxybutyrate level normalizing.   ? ?Will resume Levemir at 7U daily as previously prescribed. 4U of Mealtime Novolog ordered with SSI. qHS SSI ordered. ? ?Cardiac/Diabetic diet started.  Insulin infusion to be stopped 2 hrs after administration of basal insulin administration.    ? ?Initiating accuchecks QAC and QHS. ? ?Neomia Glass MHA, MSN, FNP-BC ?Nurse Practitioner ?[facility] ?[place] ?Pager [phone] ? ?

## 2021-05-12 NOTE — Progress Notes (Signed)
?PROGRESS NOTE ? ? ?HPI was taken from Dr. Si Raider: ?Shawn [name] is a 66 y.o. male with medical history significant for poorly controlled t2dm, ckd 3b, hfref, smoking, alcohol abuse, hypertension, prior CVA, malnutrition, who presents with the above. ?  ?Patient reports several days of poor appetite. Has not been taking most of his medications including insulin. Denies abdominal or other pain. No headache. No fevers. Denies cough or shortness of breath. Denies chest pain. No diarrhea.  ?  ?ED Course:  ?  ?Labs consistent w/ hhs/dka overlap, with aki. Treated with 2 L IV bolus and started on insulin gtt. Patient reports feeling improved at the time of my evaluation. ?  ? ? ?Shawn [name]  OEV:[id number] DOB: 05-Feb-1955 DOA: 05/11/2021 ?[facility]P: [facility] [place]  ? ?Assessment & Plan: ?  ?Principal Problem: ?  DKA (diabetic ketoacidosis) ([place]) ?Active Problems: ?  Chronic systolic CHF (congestive heart failure) ([place]) ?  NICM (nonischemic cardiomyopathy) ([place]) ?  Smoker ?  Polysubstance abuse ([place]) ?  Essential hypertension ?  AKI (acute kidney injury) ([place]) ?  Protein-calorie malnutrition, severe ([place]) ?  Stroke [facility]) ? ?Assessment and Plan: ?DKA: anion gap is closed. D/c IV insulin drip. Continue on levemir, SSI w/ accuchecks  ?  ?Likely AKI on WEX:HBZJIRCVE stage IIIb. Cr is labile. Avoid nephrotoxic meds  ?  ?UTI: urine cx is pending. Continue on IV rocephin ?  ?Severe protein calorie malnutrition: nutrition consulted ?  ?DM2: likely poorly controlled. Continue on levemir, SSI w/ accuchecks  ?  ?HCV: will need outpatient f/u w/ PCP  ?  ?Debility: OT/PT consulted. Concern for pt's ability to care for himsef ?  ?HTN: continue on coreg  ?  ?Chronic systolic CHF: EF 93-81%. Appears compensated. Continue on coreg & holding aldactone, entresto, & torsemide  ? ?Polysubstance abuse: smoking, alcohol. Polysubstance abuse cessation counseling  ? ?Thrombocytosis: etiology unclear. Will continue to monitor  ?   ? ? ? ? ? ?DVT prophylaxis: heparin  ?Code Status:  full  ?Family Communication:  ?Disposition Plan: depends on PT/OT recs  ? ?Level of care: tele med  ? ?Status is: Inpatient ?Remains inpatient appropriate because: severity of illness, weaned off of insulin drip today  ? ? ? ?Consultants:  ? ? ?Procedures:  ? ?Antimicrobials:  ? ? ?Subjective: ?Pt c/o fatigue  ? ?Objective: ?Vitals:  ? 05/12/21 1000 05/12/21 1100 05/12/21 1145 05/12/21 1428  ?BP: 100/76 100/72 104/74 (!) 80/52  ?Pulse: 65 65 66 66  ?Resp: 13 14 17 19   ?Temp:  98.1 ?F (36.7 ?C) 98 ?F (36.7 ?C) 98 ?F (36.7 ?C)  ?TempSrc:  Oral Oral   ?SpO2: 98% 98% 96% 96%  ?Weight:      ?Height:      ? ? ?Intake/Output Summary (Last 24 hours) at 05/12/2021 1603 ?Last data filed at 05/12/2021 1541 ?Gross per 24 hour  ?Intake 4952.29 ml  ?Output --  ?Net 4952.29 ml  ? ?Filed Weights  ? 05/11/21 1200  ?Weight: 59 kg  ? ? ?Examination: ? ?General exam: Appears calm but uncomfortable  ?Respiratory system: Clear to auscultation. Respiratory effort normal. ?Cardiovascular system: S1 & S2 +. No rubs, gallops or clicks.  ?Gastrointestinal system: Abdomen is nondistended, soft and nontender. Normal bowel sounds heard. ?Central nervous system: Alert and oriented. Moves all extremities  ?Psychiatry: Judgement and insight appear normal. Flat mood and affect  ? ? ? ?Data Reviewed: I have personally reviewed following labs and imaging studies ? ?CBC: ?Recent Labs  ?  Lab 05/11/21 ?1200  ?WBC 6.1  ?HGB 9.1*  ?HCT 29.7*  ?MCV 94.3  ?PLT 570*  ? ?Basic Metabolic Panel: ?Recent Labs  ?Lab 05/11/21 ?1200 05/11/21 ?1702 05/11/21 ?2051 05/12/21 ?0435 05/12/21 ?1510  ?NA 125* 130* 131* 136 136  ?K 4.3 4.1 3.9 4.2 4.2  ?CL 88* 93* 97* 101 104  ?CO2 12* 19* 24 27 25   ?GLUCOSE 1,052* 758* 463* 151* 163*  ?BUN 82* 75* 71* 69* 61*  ?CREATININE 3.50* 3.07* 2.79* 2.39* 2.18*  ?CALCIUM 9.0 9.1 9.5 9.4 9.0  ? ?GFR: ?Estimated Creatinine Clearance: 28.6 mL/min (A) (by C-G formula based on SCr of 2.18  mg/dL (H)). ?Liver Function Tests: ?No results for input(s): AST, ALT, ALKPHOS, BILITOT, PROT, ALBUMIN in the last 168 hours. ?No results for input(s): LIPASE, AMYLASE in the last 168 hours. ?No results for input(s): AMMONIA in the last 168 hours. ?Coagulation Profile: ?No results for input(s): INR, PROTIME in the last 168 hours. ?Cardiac Enzymes: ?No results for input(s): CKTOTAL, CKMB, CKMBINDEX, TROPONINI in the last 168 hours. ?BNP (last 3 results) ?No results for input(s): PROBNP in the last 8760 hours. ?HbA1C: ?No results for input(s): HGBA1C in the last 72 hours. ?CBG: ?Recent Labs  ?Lab 05/12/21 ?0355 05/12/21 ?9735 05/12/21 ?3299 05/12/21 ?2426 05/12/21 ?1107  ?GLUCAP 150* 133* 122* 134* 248*  ? ?Lipid Profile: ?No results for input(s): CHOL, HDL, LDLCALC, TRIG, CHOLHDL, LDLDIRECT in the last 72 hours. ?Thyroid Function Tests: ?No results for input(s): TSH, T4TOTAL, FREET4, T3FREE, THYROIDAB in the last 72 hours. ?Anemia Panel: ?No results for input(s): VITAMINB12, FOLATE, FERRITIN, TIBC, IRON, RETICCTPCT in the last 72 hours. ?Sepsis Labs: ?No results for input(s): PROCALCITON, LATICACIDVEN in the last 168 hours. ? ?Recent Results (from the past 240 hour(s))  ?Urine Culture     Status: Abnormal  ? Collection Time: 05/11/21 12:00 PM  ? Specimen: Urine, Clean Catch  ?Result Value Ref Range Status  ? Specimen Description   Final  ?  URINE, CLEAN CATCH ?Performed at [facility], [address] [place], [place] [address] ?  ? Special Requests   Final  ?  NONE ?Performed at [facility], [address] [place], [place] [address] ?  ? Culture MULTIPLE SPECIES PRESENT, SUGGEST RECOLLECTION (A)  Final  ? Report Status 05/12/2021 FINAL  Final  ?Culture, blood (Routine X 2) w Reflex to ID Panel     Status: None (Preliminary result)  ? Collection Time: 05/11/21  2:26 PM  ? Specimen: BLOOD  ?Result Value Ref Range Status  ? Specimen Description BLOOD RIGHT ANTECUBITAL  Final  ? Special Requests    Final  ?  BOTTLES DRAWN AEROBIC AND ANAEROBIC Blood Culture results may not be optimal due to an inadequate volume of blood received in culture bottles  ? Culture   Final  ?  NO GROWTH < 24 HOURS ?Performed at [facility], [address], [place], [place] [address] ?  ? Report Status PENDING  Incomplete  ?Culture, blood (Routine X 2) w Reflex to ID Panel     Status: None (Preliminary result)  ? Collection Time: 05/11/21  2:31 PM  ? Specimen: BLOOD  ?Result Value Ref Range Status  ? Specimen Description BLOOD BLOOD LEFT FOREARM  Final  ? Special Requests   Final  ?  BOTTLES DRAWN AEROBIC AND ANAEROBIC Blood Culture results may not be optimal due to an inadequate volume of blood received in culture bottles  ? Culture   Final  ?  NO GROWTH < 24 HOURS ?Performed at [facility], [address] [place], [place] [address] ?  ? Report Status PENDING  Incomplete  ?  ? ? ? ? ? ?Radiology Studies: ?Portable chest x-ray (1 view) ? ?Result Date: 05/11/2021 ?CLINICAL DATA:  Hypoglycemia. EXAM: PORTABLE CHEST 1 VIEW COMPARISON:  01/21/2021 FINDINGS: 1433 hours. The lungs are clear without focal pneumonia, edema, pneumothorax or pleural effusion. Biapical pleuroparenchymal scarring again noted. Cardiopericardial silhouette is at upper limits of normal for size. The visualized bony structures of the thorax are unremarkable. Telemetry leads overlie the chest. IMPRESSION: No active disease. Electronically Signed   By: Misty Stanley M.D.   On: 05/11/2021 14:37   ? ? ? ? ? ?Scheduled Meds: ? aspirin  81 mg Oral Daily  ? atorvastatin  40 mg Oral Daily  ? carvedilol  6.25 mg Oral BID WC  ? heparin  5,000 Units Subcutaneous Q8H  ? insulin aspart  0-15 Units Subcutaneous TID WC  ? insulin aspart  0-5 Units Subcutaneous QHS  ? insulin aspart  4 Units Subcutaneous TID WC  ? insulin detemir  7 Units Subcutaneous Q24H  ? nicotine  7 mg Transdermal Daily  ? ?Continuous Infusions: ? cefTRIAXone (ROCEPHIN)  IV Stopped  (05/12/21 1432)  ? lactated ringers 100 mL/hr at 05/12/21 1600  ? ? ? LOS: 1 day  ? ? ?Time spent: 35 mins  ? ? ? ?Wyvonnia Dusky, MD ?[facility] ?Pager [phone] ? ?If 7PM-7AM, please contact night-

## 2021-05-12 NOTE — Progress Notes (Signed)
Patient arrived to floor alert and interactive. Telemetry initiated, vitals taken. MD alerted about BP 80/52, MAP 61. Patients pended blood pressures from ED also low. MD ordered NS bolus x 1.  ?

## 2021-05-12 NOTE — Progress Notes (Addendum)
Inpatient Diabetes Program Recommendations ? ?AACE/ADA: New Consensus Statement on Inpatient Glycemic Control (2015) ? ?Target Ranges:  Prepandial:   less than 140 mg/dL ?     Peak postprandial:   less than 180 mg/dL (1-2 hours) ?     Critically ill patients:  140 - 180 mg/dL  ? ? Latest Reference Range & Units 05/11/21 20:50 05/11/21 21:40 05/11/21 22:48 05/12/21 00:07 05/12/21 01:07 05/12/21 02:47 05/12/21 03:55 05/12/21 05:10 05/12/21 06:27  ?Glucose-Capillary 70 - 99 mg/dL 440 (H) 384 (H) 309 (H) 244 (H) 182 (H) 166 (H) 150 (H) 133 (H) 122 (H)  ?(H): Data is abnormally high ? ? ?Admit with: DKA ?  ?History: DM, CHF, Polysubstance Abuse ?  ?Home DM Meds: Farxiga 10 mg daily ?                             Jardiance 12.5 mg daily ?                             Novolog 6 units TID with meals ?                             Semglee 25 units daily ?                             (See [facility]21) ?  ?Current Orders: IV Insulin Drip ?     Levemir 7 units Q24 hours ?     Novolog Moderate Correction Scale/ SSI (0-15 units) TID AC + HS ?         Novolog 4 units TID with meals ? ? ? ? ? ?Note pt will transition to SQ Insulin this AM ? ?Current A1c Pending ? ?Most recent A1c level on file was 11.2% (taken 04/10/2021) (see [place] records from 05/06/2021) ?  ?Has been counseled by the Inpatient Diabetes team March 2022 and January 2023 during these previous admissions ? ? ?Addendum 10:20am--Met w/ pt down in the ED.  Pt was sleepy but able to awake and answer my questions when I talked w/ him.  I am familiar with this patient from previous admissions and personally talked with pt in March 2022.  Pt told me he lives with his sister but he takes all his meds independently.  [facility]es to the [facility] care and states he has no issues with transportation.  Tried to verify Home insulin regimen with pt.  Pt stated he takes 2 insulins (one at night-- "the long one" per pt's words) and 1 with meals ("the short  one").  Pt could not remember the names of the insulins but with me naming several different basal insulins, pt was able to recall the basal insulin was Lantus--Stated he takes 11 units of "the long".  I tried to ascertain how much of the Novolog pt is supposed to be taking--pt kept stating "11 units"--when I asked him to clarify since he already stated the long-acting insulin was 11 units, pt kept repeating "11 units" but then said, "wait, No, I think it's 6 units--yeah, yeah, that sounds right".  States he has a CBG meter at home--unsure how often or of he is checking at all?  Has access to all meds through the [place].  Unsure if pt is taking  meds as prescribed?  Pt's sister told me during previous admission (March 2022) that pt does take his insulin (so she thinks) but that pt often has many dietary indiscretions.   ?  ? ?--Will follow patient during hospitalization-- ? ?Wyn Quaker RN, MSN, CDE ?Diabetes Coordinator ?Inpatient Glycemic Control Team ?Team Pager: [phone] (8a-5p) ? ?  ?

## 2021-05-13 DIAGNOSIS — E1110 Type 2 diabetes mellitus with ketoacidosis without coma: Secondary | ICD-10-CM | POA: Diagnosis not present

## 2021-05-13 LAB — CBC
HCT: 31.5 % — ABNORMAL LOW (ref 39.0–52.0)
Hemoglobin: 10.5 g/dL — ABNORMAL LOW (ref 13.0–17.0)
MCH: 29.2 pg (ref 26.0–34.0)
MCHC: 33.3 g/dL (ref 30.0–36.0)
MCV: 87.7 fL (ref 80.0–100.0)
Platelets: 550 10*3/uL — ABNORMAL HIGH (ref 150–400)
RBC: 3.59 MIL/uL — ABNORMAL LOW (ref 4.22–5.81)
RDW: 13.1 % (ref 11.5–15.5)
WBC: 5.2 10*3/uL (ref 4.0–10.5)
nRBC: 0 % (ref 0.0–0.2)

## 2021-05-13 LAB — COMPREHENSIVE METABOLIC PANEL
ALT: 21 U/L (ref 0–44)
AST: 63 U/L — ABNORMAL HIGH (ref 15–41)
Albumin: 2.6 g/dL — ABNORMAL LOW (ref 3.5–5.0)
Alkaline Phosphatase: 119 U/L (ref 38–126)
Anion gap: 8 (ref 5–15)
BUN: 53 mg/dL — ABNORMAL HIGH (ref 8–23)
CO2: 25 mmol/L (ref 22–32)
Calcium: 9 mg/dL (ref 8.9–10.3)
Chloride: 102 mmol/L (ref 98–111)
Creatinine, Ser: 1.75 mg/dL — ABNORMAL HIGH (ref 0.61–1.24)
GFR, Estimated: 43 mL/min — ABNORMAL LOW (ref >60)
Glucose, Bld: 341 mg/dL — ABNORMAL HIGH (ref 70–99)
Potassium: 4.4 mmol/L (ref 3.5–5.1)
Sodium: 135 mmol/L (ref 135–145)
Total Bilirubin: 0.6 mg/dL (ref 0.3–1.2)
Total Protein: 6.8 g/dL (ref 6.5–8.1)

## 2021-05-13 LAB — GLUCOSE, CAPILLARY
Glucose-Capillary: 409 mg/dL — ABNORMAL HIGH (ref 70–99)
Glucose-Capillary: 419 mg/dL — ABNORMAL HIGH (ref 70–99)
Glucose-Capillary: 346 mg/dL — ABNORMAL HIGH (ref 70–99)
Glucose-Capillary: 214 mg/dL — ABNORMAL HIGH (ref 70–99)
Glucose-Capillary: 102 mg/dL — ABNORMAL HIGH (ref 70–99)

## 2021-05-13 LAB — HCV RT-PCR, QUANT (NON-GRAPH): Hepatitis C Quantitation: NOT DETECTED IU/mL

## 2021-05-13 LAB — HCV AB W REFLEX TO QUANT PCR: HCV Ab: REACTIVE — AB

## 2021-05-13 MED ORDER — INSULIN DETEMIR 100 UNIT/ML ~~LOC~~ SOLN
20.0000 [IU] | Freq: Every day | SUBCUTANEOUS | Status: DC
  Administered 2021-05-13 – 2021-05-14 (×2): 20 [IU] via SUBCUTANEOUS
  Filled 2021-05-13 (×2): qty 0.2

## 2021-05-13 MED ORDER — ENSURE ENLIVE PO LIQD
237.0000 mL | Freq: Two times a day (BID) | ORAL | Status: DC
  Administered 2021-05-13: 237 mL via ORAL

## 2021-05-13 MED ORDER — GLUCERNA SHAKE PO LIQD
237.0000 mL | Freq: Three times a day (TID) | ORAL | Status: DC
  Administered 2021-05-13 – 2021-05-14 (×3): 237 mL via ORAL

## 2021-05-13 NOTE — Progress Notes (Signed)
?  Progress Note ? ? ?Patient: Shawn [name] WSF:[id number] DOB: [birth year] DOA: 05/11/2021     2 ?DOS: the patient was seen and examined on 05/13/2021 ?  ?Brief hospital course: ?No notes on file ? ?Assessment and Plan: ?DKA: anion gap is closed. off of IV insulin drip.  ?--increase Levemir to 20u daily ?--SSI ?  ?Likely AKI on YFV:CBSWHQPRF stage IIIb.  ?--cont MIVF@75  ?  ?UTI, ruled out ?urine cx pos for multiple species, suggesting chronic colonization ?--d/c ceftriaxone ?  ?Severe protein calorie malnutrition: nutrition consulted ?  ?DM2:  ?Likely insulin non-compliance  ?--A1c 12.7, poorly controlled.   ?  ?HCV: will need outpatient f/u w/ PCP  ?  ?Debility:  ?OT/PT consulted. Cleared to return home. ?  ?HTN: continue on coreg  ?  ?Chronic systolic CHF: EF 16-38%. Appears compensated.  ?Continue on coreg & holding aldactone, entresto, & torsemide  ?  ?Polysubstance abuse: smoking, alcohol. Polysubstance abuse cessation counseling  ?  ?Thrombocytosis: etiology unclear. Will continue to monitor  ? ? ?  ? ?Subjective:  ?Pt reported feeling better.  Good oral intake now. ? ? ?Physical Exam: ? ?Constitutional: NAD, AAOx3 ?HEENT: conjunctivae and lids normal, EOMI ?CV: No cyanosis.   ?RESP: normal respiratory effort, on RA ?Extremities: No effusions, edema in BLE ?SKIN: warm, dry ?Neuro: II - XII grossly intact.   ?Psych: Normal mood and affect.   ? ? ?Data Reviewed: ? ?Family Communication:  ? ?Disposition: ?Status is: Inpatient ? ? Planned Discharge Destination: Home with Home Health ? ? ? ?Time spent: 50 minutes ? ?Author: ?Enzo Bi, MD ?05/13/2021 9:49 PM ? ?For on call review [email or web address].  ?

## 2021-05-13 NOTE — Evaluation (Signed)
Occupational Therapy Evaluation ?Patient Details ?Name: Shawn [name] ?MRN: [id number] ?DOB: 05-Oct-1955 ?Today's Date: 05/13/2021 ? ? ?History of Present Illness Shawn [name] is a 66 y.o. male with medical history significant for poorly controlled t2dm, ckd 3b, hfref, smoking, alcohol abuse, hypertension, prior CVA, malnutrition, who presents with poor appetite and initial glucose 1000, found to be in DKA.  ? ?Clinical Impression ?  ?Shawn [name] was seen for OT evaluation this date. Prior to hospital admission, pt was MOD I for mobility using SPC or walker as needed (unsure 4WW or 2WW). Pt lives with brother/sister in home c 2 STE. Pt presents to acute OT demonstrating impaired ADL performance and functional mobility 2/2 decreased activity tolerance and functional strength/ROM/balance deficits.  ? ?Pt currently requires MOD A don B socks sitting EOB. SUPERVISION + RW for toilet t/f and pericare in standing. SETUP don/doff gown seated on BSC. Pt would benefit from skilled OT to address noted impairments and functional limitations (see below for any additional details). Upon hospital discharge, recommend HHOT to maximize pt safety and return to PLOF.  ? ?Recommendations for follow up therapy are one component of a multi-disciplinary discharge planning process, led by the attending physician.  Recommendations may be updated based on patient status, additional functional criteria and insurance authorization.  ? ?Follow Up Recommendations ? Home health OT  ?  ?Assistance Recommended at Discharge Intermittent Supervision/Assistance  ?Patient can return home with the following A little help with bathing/dressing/bathroom;Help with stairs or ramp for entrance;Assistance with cooking/housework ? ?  ?Functional Status Assessment ? Patient has had a recent decline in their functional status and demonstrates the ability to make significant improvements in function in a reasonable and predictable amount of time.   ?Equipment Recommendations ? BSC/3in1;Other (comment) (2WW)  ?  ?Recommendations for Other Services   ? ? ?  ?Precautions / Restrictions Precautions ?Precautions: Fall ?Restrictions ?Weight Bearing Restrictions: No  ? ?  ? ?Mobility Bed Mobility ?Overal bed mobility: Modified Independent ?  ?  ?  ?  ?  ?  ?  ?  ? ?Transfers ?Overall transfer level: Needs assistance ?Equipment used: Rolling walker (2 wheels) ?Transfers: Sit to/from Stand ?Sit to Stand: Supervision ?  ?  ?  ?  ?  ?General transfer comment: cues for eccentric control ?  ? ?  ?Balance Overall balance assessment: Needs assistance ?Sitting-balance support: No upper extremity supported, Feet supported ?Sitting balance-Leahy Scale: Good ?  ?  ?Standing balance support: Single extremity supported, During functional activity ?Standing balance-Leahy Scale: Fair ?  ?  ?  ?  ?  ?  ?  ?  ?  ?  ?  ?  ?   ? ?ADL either performed or assessed with clinical judgement  ? ?ADL Overall ADL's : Needs assistance/impaired ?  ?  ?  ?  ?  ?  ?  ?  ?  ?  ?  ?  ?  ?  ?  ?  ?  ?  ?  ?General ADL Comments: MOD A don B socks sitting EOB. SUPERVISION + RW for toilet t/f and pericare in standing. SETUP don/doff gown seated on BSC.  ? ? ? ? ?Pertinent Vitals/Pain Pain Assessment ?Pain Assessment: No/denies pain  ? ? ? ?Hand Dominance Right ?  ?Extremity/Trunk Assessment Upper Extremity Assessment ?Upper Extremity Assessment: Overall WFL for tasks assessed ?  ?Lower Extremity Assessment ?Lower Extremity Assessment: Generalized weakness ?  ?  ?  ?Communication Communication ?Communication: No difficulties ?  ?  Cognition Arousal/Alertness: Awake/alert ?Behavior During Therapy: [facility] for tasks assessed/performed ?Overall Cognitive Status: Within Functional Limits for tasks assessed ?  ?  ?  ?  ?  ?  ?  ?  ?  ?  ?  ?  ?  ?  ?  ?  ?  ?  ?  ?   ?   ?   ? ? ?Home Living Family/patient expects to be discharged to:: Private residence ?Living Arrangements: Other relatives (brother, sister,  brother in law) ?Available Help at Discharge: Family;Available PRN/intermittently ?Type of Home: House ?Home Access: Stairs to enter ?Entrance Stairs-Number of Steps: 2 ?Entrance Stairs-Rails: Can reach both ?Home Layout: One level ?  ?  ?  ?  ?  ?  ?  ?Home Equipment: Cane - single point;Rollator (4 wheels);Grab bars - tub/shower ?  ?Additional Comments: unclear if 4WW or 2WW ?  ? ?  ?Prior Functioning/Environment Prior Level of Function : Independent/Modified Independent ?  ?  ?  ?  ?  ?  ?Mobility Comments: uses SPC or walker as needed ?ADLs Comments: assist for LB dressing and IADLs, reprots wearing depends at baseline ?  ? ?  ?  ?OT Problem List: Decreased strength;Decreased range of motion;Decreased activity tolerance;Impaired balance (sitting and/or standing);Decreased safety awareness ?  ?   ?OT Treatment/Interventions: Self-care/ADL training;Therapeutic exercise;Energy conservation;DME and/or AE instruction;Therapeutic activities;Patient/family education;Balance training  ?  ?OT Goals(Current goals can be found in the care plan section) Acute Rehab OT Goals ?Patient Stated Goal: to walk better ?OT Goal Formulation: With patient ?Time For Goal Achievement: 05/27/21 ?Potential to Achieve Goals: Good ?ADL Goals ?Pt Will Perform Grooming: with modified independence;standing ?Pt Will Perform Lower Body Dressing: with set-up;with supervision;sit to/from stand ?Pt Will Transfer to Toilet: with modified independence;ambulating;regular height toilet  ?OT Frequency: Min 2X/week ?  ? ?Co-evaluation   ?  ?  ?  ?  ? ?  ?AM-PAC OT "6 Clicks" Daily Activity     ?Outcome Measure Help from another person eating meals?: None ?Help from another person taking care of personal grooming?: A Little ?Help from another person toileting, which includes using toliet, bedpan, or urinal?: A Little ?Help from another person bathing (including washing, rinsing, drying)?: A Little ?Help from another person to put on and taking off regular  upper body clothing?: None ?Help from another person to put on and taking off regular lower body clothing?: A Lot ?6 Click Score: 19 ?  ?End of Session Equipment Utilized During Treatment: Rolling walker (2 wheels) ? ?Activity Tolerance: Patient tolerated treatment well ?Patient left: in bed;with call bell/phone within reach;with bed alarm set ? ?OT Visit Diagnosis: Other abnormalities of gait and mobility (R26.89)  ?              ?Time: 1219-7588 ?OT Time Calculation (min): 19 min ?Charges:  OT General Charges ?$OT Visit: 1 Visit ?OT Evaluation ?$OT Eval Low Complexity: 1 Low ?OT Treatments ?$Self Care/Home Management : 8-22 mins ? ?Dessie Coma, M.S. OTR/L  ?05/13/21, 10:23 AM  ?ascom [phone] ? ?

## 2021-05-13 NOTE — Evaluation (Signed)
Physical Therapy Evaluation ?Patient Details ?Name: Shawn [name] ?MRN: [id number] ?DOB: 07-07-1955 ?Today's Date: 05/13/2021 ? ?History of Present Illness ? Shawn [name] is a 66 y.o. male with medical history significant for poorly controlled IIDM, ckd 3b, hfref, smoking, alcohol abuse, hypertension, prior CVA, malnutrition, who presents with poor appetite and initial glucose 1000, found to be in DKA.  ?Clinical Impression ? Pt laying in bed in NAD, but not particularly eager to get up and do much - with moderate cuing/encouragement he did agree it'd be good to get up out of bed, do some ambulation.  He was reliant on the walker but able to ambulate ~75 ft with slow but safe effort, also negotiated up/down 2 steps with b/l rails but no direct assist.  Pt reports he feels a little weaker than his baseline, but good about being able to home home with family, interested in continuing HHPT at discharge.   ?   ? ?Recommendations for follow up therapy are one component of a multi-disciplinary discharge planning process, led by the attending physician.  Recommendations may be updated based on patient status, additional functional criteria and insurance authorization. ? ?Follow Up Recommendations Home health PT ? ?  ?Assistance Recommended at Discharge Intermittent Supervision/Assistance  ?Patient can return home with the following ? Assist for transportation;Assistance with cooking/housework;A little help with bathing/dressing/bathroom ? ?  ?Equipment Recommendations None recommended by PT  ?Recommendations for Other Services ?    ?  ?Functional Status Assessment Patient has had a recent decline in their functional status and demonstrates the ability to make significant improvements in function in a reasonable and predictable amount of time.  ? ?  ?Precautions / Restrictions Precautions ?Precautions: Fall ?Restrictions ?Weight Bearing Restrictions: No  ? ?  ? ?Mobility ? Bed Mobility ?Overal bed mobility:  Modified Independent ?  ?  ?  ?  ?  ?  ?General bed mobility comments: able to transition supine to sit w/o assist ?  ? ?Transfers ?Overall transfer level: Needs assistance ?Equipment used: Rolling walker (2 wheels) ?Transfers: Sit to/from Stand ?Sit to Stand: Supervision ?  ?  ?  ?  ?  ?General transfer comment: minimal cuing for UE use and positioning, able to control descent ?  ? ?Ambulation/Gait ?Ambulation/Gait assistance: Min guard ?Gait Distance (Feet): 100 Feet ?Assistive device: Rolling walker (2 wheels) ?  ?  ?  ?  ?General Gait Details: Pt able to maintain slow but steady cadence with no LOBs but consistent c/o general stiffness.  Pt did fatigue though O2 remained in the high 90s on room air and HR generally stayed below 90. ? ?Stairs ?  ?  ?  ?  ?  ? ?Wheelchair Mobility ?  ? ?Modified Rankin (Stroke Patients Only) ?  ? ?  ? ?Balance Overall balance assessment: Needs assistance ?Sitting-balance support: No upper extremity supported, Feet supported ?Sitting balance-Leahy Scale: Good ?  ?  ?Standing balance support: Bilateral upper extremity supported ?Standing balance-Leahy Scale: Fair ?Standing balance comment: Pt did not have any LOBs but there was some hesitancy with weight shifts and definite need for UE support with ambulation and stairs ?  ?  ?  ?  ?  ?  ?  ?  ?  ?  ?  ?   ? ? ? ?Pertinent Vitals/Pain Pain Assessment ?Pain Assessment:  (did report some short lived suprapubic pain while walking)  ? ? ?Home Living Family/patient expects to be discharged to:: Private residence ?Living Arrangements: Other relatives (  siblings) ?Available Help at Discharge: Family;Available PRN/intermittently ?Type of Home: House ?Home Access: Stairs to enter ?Entrance Stairs-Rails: Can reach both ?Entrance Stairs-Number of Steps: 2 ?  ?Home Layout: One level ?Home Equipment: Cane - single point;Rollator (4 wheels);Grab bars - tub/shower ?   ?  ?Prior Function Prior Level of Function : Independent/Modified Independent ?   ?  ?  ?  ?  ?  ?Mobility Comments: uses SPC or walker as needed ?ADLs Comments: assist for LB dressing and IADLs, reports wearing depends at baseline ?  ? ? ?Hand Dominance  ?   ? ?  ?Extremity/Trunk Assessment  ? Upper Extremity Assessment ?Upper Extremity Assessment: Overall WFL for tasks assessed ?  ? ?Lower Extremity Assessment ?Lower Extremity Assessment: Generalized weakness ?  ? ?   ?Communication  ? Communication: No difficulties  ?Cognition Arousal/Alertness: Awake/alert ?Behavior During Therapy: [facility] for tasks assessed/performed ?Overall Cognitive Status: Within Functional Limits for tasks assessed ?  ?  ?  ?  ?  ?  ?  ?  ?  ?  ?  ?  ?  ?  ?  ?  ?  ?  ?  ? ?  ?General Comments General comments (skin integrity, edema, etc.): Pt initially not wanting to do much or sit up, but ultimately agreed that it'd be good for him to be up and moving and did so ? ?  ?Exercises    ? ?Assessment/Plan  ?  ?PT Assessment Patient needs continued PT services  ?PT Problem List Decreased strength;Decreased range of motion;Decreased activity tolerance;Decreased balance;Decreased mobility;Decreased knowledge of use of DME;Decreased safety awareness ? ?   ?  ?PT Treatment Interventions DME instruction;Gait training;Stair training;Functional mobility training;Therapeutic activities;Therapeutic exercise;Balance training;Neuromuscular re-education;Patient/family education   ? ?PT Goals (Current goals can be found in the Care Plan section)  ?Acute Rehab PT Goals ?Patient Stated Goal: go home ?PT Goal Formulation: With patient ?Time For Goal Achievement: 05/27/21 ?Potential to Achieve Goals: Good ? ?  ?Frequency Min 2X/week ?  ? ? ?Co-evaluation   ?  ?  ?  ?  ? ? ?  ?AM-PAC PT "6 Clicks" Mobility  ?Outcome Measure Help needed turning from your back to your side while in a flat bed without using bedrails?: None ?Help needed moving from lying on your back to sitting on the side of a flat bed without using bedrails?: None ?Help needed  moving to and from a bed to a chair (including a wheelchair)?: None ?Help needed standing up from a chair using your arms (e.g., wheelchair or bedside chair)?: None ?Help needed to walk in hospital room?: A Little ?Help needed climbing 3-5 steps with a railing? : A Little ?6 Click Score: 22 ? ?  ?End of Session Equipment Utilized During Treatment: Gait belt ?Activity Tolerance: Patient limited by fatigue;Patient tolerated treatment well ?Patient left: with chair alarm set;with call bell/phone within reach ?Nurse Communication: Mobility status ?PT Visit Diagnosis: Muscle weakness (generalized) (M62.81);Difficulty in walking, not elsewhere classified (R26.2) ?  ? ?Time: 8185-6314 ?PT Time Calculation (min) (ACUTE ONLY): 26 min ? ? ?Charges:   PT Evaluation ?$PT Eval Low Complexity: 1 Low ?PT Treatments ?$Gait Training: 8-22 mins ?  ?   ? ? ?Kreg Shropshire, DPT ?05/13/2021, 3:29 PM ? ?

## 2021-05-13 NOTE — Progress Notes (Signed)
Initial Nutrition Assessment ? ?DOCUMENTATION CODES:  ? ?Severe malnutrition in context of chronic illness ? ?INTERVENTION:  ?- Liberalize diet from a heart healthy/carb modified to a carb modified diet to provide widest variety of menu options to enhance nutritional adequacy ? ?- Glucerna Shake po TID, each supplement provides 220 kcal and 10 grams of protein ? ?- MVI with minerals daily ? ?NUTRITION DIAGNOSIS:  ? ?Severe Malnutrition related to chronic illness (uncontrolled DM) as evidenced by severe fat depletion, severe muscle depletion. ? ?GOAL:  ? ?Patient will meet greater than or equal to 90% of their needs ? ?MONITOR:  ? ?PO intake, Supplement acceptance, Diet advancement, Labs, Weight trends ? ?REASON FOR ASSESSMENT:  ? ?Consult ?Assessment of nutrition requirement/status ? ?ASSESSMENT:  ? ?Pt admitted with several days of poor appetite leading to DKA. PMH significant for poorly controlled T2DM, CKD 3b, smoking, alcohol abuse, HTN, prior CVA and malnutrition. ? ?Pt endorses decreased appetite and increased thirst within the past week. He states that he typically eats 3 meals per day consisting of eggs, bacon, french toast and oatmeal with cinnamon for breakfast, and then meats and various vegetables; he tries to avoid bread and pasta but when he does he tries to eats whole wheat. Within the past week, he states that he has been eating half of the amount than usual. Pt reports that he sees a Microbiologist through the [place]. They have been concerned about his weight and have been providing cases of Glucerna shakes for him and encouraged him to drink them with each meal.  ? ?Meal completions: ?05/10: 80%-breakfast  ? ?Pt states that he has continued to take his long acting and meal time insulin. He states that he checks his blood sugar prior to meals and 1 hour after meals. He reports that his blood sugars 1 hour after a meal ranges from 200-300 and this is when he takes his meal time insulin.  ? ?Pt states that his  usual weight is 150 lbs and endorses a weight loss of 15 lbs within the last month. Per review of weight history, unable to confirm this weight loss. It appears pt's weight has been trending down however unsure if these are actual or stated weights. Will continue to monitor throughout admission. ? ?Medications:  SSI 0-15 units TID, SSI 0-5 units QHS, SSI 4 units daily TID, levemir 20 units daily ?IV drips: LR @ 84ml/hr ? ?Labs: BUN 53, Cr 1.75, AST 63, GFR 43, HgbA1c 12.7%, CBG's 99-419 x 24 hours ? ?NUTRITION - FOCUSED PHYSICAL EXAM: ? ?Flowsheet Row Most Recent Value  ?Orbital Region Severe depletion  ?Upper Arm Region Severe depletion  ?Thoracic and Lumbar Region Moderate depletion  ?Buccal Region Moderate depletion  ?Temple Region Severe depletion  ?Clavicle Bone Region Severe depletion  ?Clavicle and Acromion Bone Region Severe depletion  ?Scapular Bone Region Moderate depletion  ?Dorsal Hand Severe depletion  ?Patellar Region Severe depletion  ?Anterior Thigh Region Severe depletion  ?Posterior Calf Region Severe depletion  ?Edema (RD Assessment) None  ?Hair Reviewed  ?Eyes Reviewed  ?Mouth Reviewed  ?Skin Reviewed  ?Nails Reviewed  ? ?  ? ? ?Diet Order:   ?Diet Order   ? ?       ?  Diet Carb Modified Fluid consistency: Thin; Room service appropriate? Yes  Diet effective now       ?  ? ?  ?  ? ?  ? ? ?EDUCATION NEEDS:  ? ?Education needs have been addressed ? ?Skin:  Skin Assessment: Reviewed RN Assessment ? ?Last BM:  5/9 (per pt) ? ?Height:  ? ?Ht Readings from Last 1 Encounters:  ?05/11/21 5\' 7"  (1.702 m)  ? ? ?Weight:  ? ?Wt Readings from Last 1 Encounters:  ?05/11/21 59 kg  ? ?BMI:  Body mass index is 20.36 kg/m?. ? ?Estimated Nutritional Needs:  ? ?Kcal:  1700-1900 ? ?Protein:  85-100g ? ?Fluid:  >/=1.7L ? ?Clayborne Dana, RDN, LDN ?Clinical Nutrition ?

## 2021-05-13 NOTE — Progress Notes (Signed)
Pt refused the nighttime dose of heparin. Provided education to the Pt regarding the importance of taking the ordered heparin. The Pt verbalized understanding and still refused.  ?

## 2021-05-13 NOTE — Progress Notes (Signed)
Inpatient Diabetes Program Recommendations ? ?AACE/ADA: New Consensus Statement on Inpatient Glycemic Control (2015) ? ?Target Ranges:  Prepandial:   less than 140 mg/dL ?     Peak postprandial:   less than 180 mg/dL (1-2 hours) ?     Critically ill patients:  140 - 180 mg/dL  ? ?Lab Results  ?Component Value Date  ? GLUCAP 419 (H) 05/13/2021  ? HGBA1C 12.7 (H) 05/11/2021  ? ? ?Review of Glycemic Control ? Latest Reference Range & Units 05/12/21 07:29 05/12/21 11:07 05/12/21 17:20 05/12/21 20:56 05/13/21 07:59 05/13/21 08:11  ?Glucose-Capillary 70 - 99 mg/dL 134 (H) 248 (H) 99 154 (H) 409 (H) 419 (H)  ? ?Diabetes history: DM 2 ?Outpatient Diabetes medications:  ?Novolog 0-15 units tid with meals ?Semglee 25 units daily ?Current orders for Inpatient glycemic control:  ?Novolog moderate tid with meals and HS ?Novolog 4 units tid with meals ?Levemir 20 units daily ? ?Inpatient Diabetes Program Recommendations:   ? ?Agree with increase in basal insulin.  Consider switching to Semglee 25 units daily (this was home medication).  ? ?Thanks,  ?Adah Perl, RN, BC-ADM ?Inpatient Diabetes Coordinator ?Pager [phone]  (8a-5p) ? ? ? ? ?

## 2021-05-14 DIAGNOSIS — E1110 Type 2 diabetes mellitus with ketoacidosis without coma: Secondary | ICD-10-CM | POA: Diagnosis not present

## 2021-05-14 LAB — BASIC METABOLIC PANEL
Anion gap: 4 — ABNORMAL LOW (ref 5–15)
BUN: 48 mg/dL — ABNORMAL HIGH (ref 8–23)
CO2: 28 mmol/L (ref 22–32)
Calcium: 9 mg/dL (ref 8.9–10.3)
Chloride: 103 mmol/L (ref 98–111)
Creatinine, Ser: 1.41 mg/dL — ABNORMAL HIGH (ref 0.61–1.24)
GFR, Estimated: 56 mL/min — ABNORMAL LOW (ref >60)
Glucose, Bld: 94 mg/dL (ref 70–99)
Potassium: 4.4 mmol/L (ref 3.5–5.1)
Sodium: 135 mmol/L (ref 135–145)

## 2021-05-14 LAB — CBC
HCT: 29.9 % — ABNORMAL LOW (ref 39.0–52.0)
Hemoglobin: 9.8 g/dL — ABNORMAL LOW (ref 13.0–17.0)
MCH: 29.3 pg (ref 26.0–34.0)
MCHC: 32.8 g/dL (ref 30.0–36.0)
MCV: 89.3 fL (ref 80.0–100.0)
Platelets: 474 10*3/uL — ABNORMAL HIGH (ref 150–400)
RBC: 3.35 MIL/uL — ABNORMAL LOW (ref 4.22–5.81)
RDW: 13.4 % (ref 11.5–15.5)
WBC: 3.7 10*3/uL — ABNORMAL LOW (ref 4.0–10.5)
nRBC: 0 % (ref 0.0–0.2)

## 2021-05-14 LAB — MAGNESIUM: Magnesium: 2 mg/dL (ref 1.7–2.4)

## 2021-05-14 LAB — GLUCOSE, CAPILLARY: Glucose-Capillary: 124 mg/dL — ABNORMAL HIGH (ref 70–99)

## 2021-05-14 MED ORDER — TORSEMIDE 20 MG PO TABS: ORAL_TABLET | ORAL | 0 refills | Status: DC

## 2021-05-14 MED ORDER — SPIRONOLACTONE 25 MG PO TABS
ORAL_TABLET | ORAL | Status: DC
Start: 2021-05-14 — End: 2021-07-07

## 2021-05-14 MED ORDER — INSULIN DETEMIR 100 UNIT/ML ~~LOC~~ SOLN: 20.0000 [IU] | Freq: Every day | SUBCUTANEOUS | 2 refills | Status: DC

## 2021-05-14 MED ORDER — INSULIN GLARGINE-YFGN 100 UNIT/ML ~~LOC~~ SOPN
20.0000 [IU] | PEN_INJECTOR | Freq: Every day | SUBCUTANEOUS | Status: DC
Start: 2021-05-14 — End: 2022-08-31

## 2021-05-14 NOTE — Discharge Summary (Signed)
? ?Physician Discharge Summary ? ? ?Shawn [name]  male DOB: [birth year]  ?[id number] ? ?[facility]P: [facility] ? ?Admit date: 05/11/2021 ?Discharge date: 05/14/2021 ? ?Admitted From: home ?Disposition:  home ?Sister updated on discharge plans prior to discharge. ?Home Health: Yes ?CODE STATUS: Full code ? ?Discharge Instructions   ? ? Discharge instructions   Complete by: As directed ?  ? Your diabetes is very uncontrolled, A1c is 12.7.  Since you have a hard time figuring out what insulin to take and how much, I am just having you take your long-acting insulin glargine 20 units once a day, which your sister will give it to you.  Don't worry about the short-acting mealtime insulin for now.  As long as you receive glargine 20 units once a day, you should stay out of trouble such as DKA.   ? ?You have acute kidney injury from dehydration from DKA.  Please hold your torsemide and Aldactone until followup with your outpatient doctor. ? ? ?Dr. Enzo Bi ?- ?-  ? ?  ? ?Hospital Course:  ?For full details, please see H&P, progress notes, consult notes and ancillary notes.  ?Briefly,  ?ZYAN COBY is a 66 y.o. male with medical history significant for poorly controlled t2dm, ckd 3b, hfref, smoking, alcohol abuse, hypertension, prior CVA, malnutrition, who presents with poor appetite.  Pt was found to be in DKA. ? ?DKA ?DM2, poorly controlled ?Likely insulin non-compliance  ?--on presentation, gap 25, bicarb 12, BG 1052, pos ketones in urine, elevated beta-hydroxybutyric acid.  Pt received IVF, insulin gtt and then transitioned to subQ insulin.   ?--A1c 12.7, poorly controlled.   ?--Pt appeared to have difficulty figuring out what insulin to take and how much, so may have just not taking any insulin PTA.  Sister is willing to help, but not able to be around to give insulin 4 times per day.  After discussion with sister, I recommend pt just receiving glargine 20u daily, to be given by his sister, and not  worry about mealtime short-acting insulin.  Hopefully with the simple once daily regimen, pt will be complaint and avoid going into DKA, and have better control of BG. ? ?AKI on CKD stage IIIa.  ?--Cr 3.5 on presentation, improved with IVF to 1.41 prior to discharge.   ?  ?UTI, ruled out ?urine cx pos for multiple species, suggesting chronic colonization ?--ceftriaxone started on presentation, not continued. ?  ?Severe protein calorie malnutrition: ?Supplements and Ensure per dietician.  ?  ?Hx of HCV infection  ?--HCV ab pos, PCR quant not detected. ?  ?Debility:  ?OT/PT consulted. Cleared to return home. ?  ?Chronic systolic CHF: EF 35-45%. Appears compensated.  ?Continue on coreg  ?Home Entresto resumed after discharge ?Home aldactone and torsemide held pending outpatient f/u due to AKI. ? ?HTN:  ?Continue on coreg  ?Home Entresto resumed after discharge ?Home aldactone and torsemide held pending outpatient f/u due to AKI. ?  ?Polysubstance abuse: smoking, alcohol. Polysubstance abuse cessation counseling  ?  ?Thrombocytosis:  ?etiology unclear.  ? ? ?Discharge Diagnoses:  ?Principal Problem: ?  DKA (diabetic ketoacidosis) ([place]) ?Active Problems: ?  Chronic systolic CHF (congestive heart failure) ([place]) ?  NICM (nonischemic cardiomyopathy) ([place]) ?  Smoker ?  Polysubstance abuse ([place]) ?  Essential hypertension ?  AKI (acute kidney injury) ([place]) ?  Protein-calorie malnutrition, severe ([place]) ?  Stroke [facility]) ? ? ?30 Day Unplanned Readmission Risk Score   ? ?Flowsheet Row ED to Hosp-Admission (  Current) from 05/11/2021 in [place]  ?30 Day Unplanned Readmission Risk Score (%) 30.59 Filed at 05/14/2021 0400  ? ?  ? ? This score is the patient's risk of an unplanned readmission within 30 days of being discharged (0 -100%). The score is based on dignosis, age, lab data, medications, orders, and past utilization.   ?Low:  0-14.9   Medium: 15-21.9   High: 22-29.9   Extreme: 30 and  above ? ?  ? ?  ? ? ?Discharge Instructions: ? ?Allergies as of 05/14/2021   ? ?   Reactions  ? Pravastatin   ? Other reaction(s): Muscle pain  ? Simvastatin   ? Other reaction(s): Muscle pain  ? ?  ? ?  ?Medication List  ?  ? ?STOP taking these medications   ? ?B-Complex-C Tabs ?  ?dapagliflozin propanediol 10 MG Tabs tablet ?Commonly known as: [place] ?  ?empagliflozin 25 MG Tabs tablet ?Commonly known as: JARDIANCE ?  ?hydrOXYzine 10 MG tablet ?Commonly known as: ATARAX ?  ?insulin aspart 100 UNIT/ML injection ?Commonly known as: novoLOG ?  ?insulin detemir 100 UNIT/ML injection ?Commonly known as: LEVEMIR ?  ?potassium chloride SA 20 MEQ tablet ?Commonly known as: KLOR-CON M ?  ?sodium chloride 1 g tablet ?  ? ?  ? ?TAKE these medications   ? ?acetaminophen 325 MG tablet ?Commonly known as: TYLENOL ?Take 2 tablets (650 mg total) by mouth every 6 (six) hours as needed for mild pain (or Fever >/= 101). ?  ?aspirin 81 MG chewable tablet ?Chew 1 tablet (81 mg total) by mouth daily. ?  ?atorvastatin 40 MG tablet ?Commonly known as: LIPITOR ?TAKE ONE TABLET BY MOUTH AT BEDTIME FOR CHOLESTEROL ?  ?carvedilol 12.5 MG tablet ?Commonly known as: COREG ?Take 12.5 mg by mouth 2 (two) times daily with a meal. ?  ?cholecalciferol 25 MCG (1000 UNIT) tablet ?Commonly known as: VITAMIN D ?Take 2,000 Units by mouth daily. Mon ?  ?feeding supplement (GLUCERNA SHAKE) Liqd ?Take 237 mLs by mouth 3 (three) times daily between meals. ?  ?insulin glargine-yfgn 100 UNIT/ML Pen ?Commonly known as: SEMGLEE ?Inject 20 Units into the skin daily. ?What changed: how much to take ?  ?multivitamin with minerals Tabs tablet ?Take 1 tablet by mouth daily. ?  ?omeprazole 20 MG capsule ?Commonly known as: PRILOSEC ?Take 40 mg by mouth 2 (two) times daily before a meal. ?  ?sacubitril-valsartan 49-51 MG ?Commonly known as: ENTRESTO ?Take 0.5 tablets by mouth daily. ?What changed: Another medication with the same name was removed. Continue taking this  medication, and follow the directions you see here. ?  ?spironolactone 25 MG tablet ?Commonly known as: ALDACTONE ?Hold until outpatient followup due to acute kidney injury. ?What changed:  ?how much to take ?how to take this ?when to take this ?additional instructions ?  ?torsemide 20 MG tablet ?Commonly known as: DEMADEX ?Hold until outpatient followup due to acute kidney injury. ?What changed:  ?how much to take ?how to take this ?when to take this ?additional instructions ?  ? ?  ? ? ? Follow-up Information   ? ? [facility].   ?Specialty: General Practice ?Contact information: ?[address] ?[place] [address] ?[phone] ? ? ?  ?  ? ?  ?  ? ?  ? ? ?Allergies  ?Allergen Reactions  ? Pravastatin   ?  Other reaction(s): Muscle pain  ? Simvastatin   ?  Other reaction(s): Muscle  pain  ? ? ? ?The results of significant diagnostics from this hospitalization (including imaging, microbiology, ancillary and laboratory) are listed below for reference.  ? ?Consultations: ? ? ?Procedures/Studies: ?Portable chest x-ray (1 view) ? ?Result Date: 05/11/2021 ?CLINICAL DATA:  Hypoglycemia. EXAM: PORTABLE CHEST 1 VIEW COMPARISON:  01/21/2021 FINDINGS: 1433 hours. The lungs are clear without focal pneumonia, edema, pneumothorax or pleural effusion. Biapical pleuroparenchymal scarring again noted. Cardiopericardial silhouette is at upper limits of normal for size. The visualized bony structures of the thorax are unremarkable. Telemetry leads overlie the chest. IMPRESSION: No active disease. Electronically Signed   By: Misty Stanley M.D.   On: 05/11/2021 14:37   ? ? ? ?Labs: ?BNP (last 3 results) ?No results for input(s): BNP in the last 8760 hours. ?Basic Metabolic Panel: ?Recent Labs  ?Lab 05/11/21 ?2051 05/12/21 ?0435 05/12/21 ?1510 05/13/21 ?3338 05/14/21 ?0446  ?NA 131* 136 136 135 135  ?K 3.9 4.2 4.2 4.4 4.4  ?CL 97* 101 104 102 103  ?CO2 24 27 25 25 28   ?GLUCOSE 463* 151* 163* 341* 94  ?BUN 71* 69* 61*  53* 48*  ?CREATININE 2.79* 2.39* 2.18* 1.75* 1.41*  ?CALCIUM 9.5 9.4 9.0 9.0 9.0  ?MG  --   --   --   --  2.0  ? ?Liver Function Tests: ?Recent Labs  ?Lab 05/13/21 ?3291  ?AST 63*  ?ALT 21  ?ALKPHOS 119

## 2021-05-14 NOTE — TOC Initial Note (Addendum)
Transition of Care (TOC) - Initial/Assessment Note  ? ? ?Patient Details  ?Name: Shawn [name] ?MRN: [id number] ?Date of Birth: [birth year] ? ?Transition of Care (TOC) CM/SW Contact:    ?Pete Pelt, RN ?Phone Number: ?05/14/2021, 10:23 AM ? ?Clinical Narrative:       Patient is already established with [facility].  They are able to take patient on discharge for services per Floydene Flock.     ? ?Addendum 1113 am patient states he does not need DME      ? ? ?Expected Discharge Plan: [place] ?Barriers to Discharge: Continued Medical Work up ? ? ?Patient Goals and CMS Choice ?  ?  ?  ? ?Expected Discharge Plan and Services ?Expected Discharge Plan: [place] ?  ?Discharge Planning Services: CM Consult ?Post Acute Care Choice: Home Health ?  ?Expected Discharge Date: 05/14/21               ?  ?  ?  ?  ?  ?  ?[place] Agency: [place] ([place]) ?Date HH Agency Contacted: 05/14/21 ?Time [place]: [phone] ?Representative spoke with at [place]: Corene Cornea ? ?Prior Living Arrangements/Services ?  ?  ?Patient language and need for interpreter reviewed:: Yes ?Do you feel safe going back to the place where you live?: Yes      ?Need for Family Participation in Patient Care: Yes (Comment) ?Care giver support system in place?: Yes (comment) ?Current home services: Home PT, Home RN, Home OT ?Criminal Activity/Legal Involvement Pertinent to Current Situation/Hospitalization: No - Comment as needed ? ?Activities of Daily Living ?Home Assistive Devices/Equipment: Kasandra Knudsen (specify quad or straight), Wheelchair ?ADL Screening (condition at time of admission) ?Patient's cognitive ability adequate to safely complete daily activities?: Yes ?Is the patient deaf or have difficulty hearing?: No ?Does the patient have difficulty seeing, even when wearing glasses/contacts?: Yes ?Does the patient have difficulty concentrating, remembering, or making decisions?: No ?Patient able to  express need for assistance with ADLs?: Yes ?Does the patient have difficulty dressing or bathing?: Yes ?Independently performs ADLs?: No ?Communication: Independent ?Dressing (OT): Needs assistance ?Is this a change from baseline?: Change from baseline, expected to last <3days ?Grooming: Needs assistance ?Is this a change from baseline?: Change from baseline, expected to last <3 days ?Feeding: Independent ?Bathing: Needs assistance ?Is this a change from baseline?: Change from baseline, expected to last <3 days ?Toileting: Needs assistance ?Is this a change from baseline?: Change from baseline, expected to last <3 days ?In/Out Bed: Needs assistance ?Is this a change from baseline?: Change from baseline, expected to last <3 days ?Walks in Home: Independent with device (comment) ?Does the patient have difficulty walking or climbing stairs?: Yes ?Weakness of Legs: Both ?Weakness of Arms/Hands: None ? ?Permission Sought/Granted ?Permission sought to share information with : Case Manager ?Permission granted to share information with : Yes, Verbal Permission Granted ?   ? Permission granted to share info w AGENCY: [facility] ?   ?   ? ?Emotional Assessment ?Appearance:: Appears stated age ?Attitude/Demeanor/Rapport: Gracious ?Affect (typically observed): Pleasant ?  ?Alcohol / Substance Use: Not Applicable ?Psych Involvement: No (comment) ? ?Admission diagnosis:  Lower urinary tract infectious disease [N39.0] ?DKA (diabetic ketoacidosis) ([place]) [E11.10] ?Diabetic ketoacidosis without coma associated with type 1 diabetes mellitus ([place]) [E10.10] ?Patient Active Problem List  ? Diagnosis Date Noted  ? DKA (diabetic ketoacidosis) ([place]) 05/11/2021  ? Confusion   ? Hyperosmolar hyperglycemic state (HHS) ([place])  03/16/2020  ? Acute metabolic encephalopathy 35/78/9784  ? Elevated troponin 03/16/2020  ? HLD (hyperlipidemia) 03/16/2020  ? Abnormal CXR 03/16/2020  ? Hypothermia 03/16/2020  ? Hypotension 04/30/2019  ? Stroke  [facility])   ? GERD (gastroesophageal reflux disease)   ? Elevated lactic acid level   ? Dehydration   ? Protein-calorie malnutrition, severe ([place]) 04/25/2019  ? Hyperosmolar non-ketotic state due to type 2 diabetes mellitus ([place]) 04/23/2019  ? Hyperglycemia due to type 2 diabetes mellitus ([place]) 04/23/2019  ? Hyperkalemia 04/23/2019  ? Acute renal failure superimposed on stage 3b chronic kidney disease ([place]) 04/23/2019  ? Diarrhea 04/23/2019  ? Congestive heart failure with cardiomyopathy ([place]) 01/18/2019  ? AKI (acute kidney injury) ([place])   ? Fluid retention   ? Nonketotic hyperglycinemia ([place]) 09/22/2018  ? Nonketotic hypoglycemia 09/22/2018  ? DKA, type 2 ([place]) 08/07/2018  ? Essential hypertension 05/12/2018  ? Chronic systolic CHF (congestive heart failure) ([place]) 12/15/2017  ? NICM (nonischemic cardiomyopathy) ([place]) 05/13/2017  ? Type 2 diabetes mellitus with stage 3b chronic kidney disease, with long-term current use of insulin ([place]) 05/13/2017  ? Smoker 05/13/2017  ? Polysubstance abuse ([place]) 05/13/2017  ? Alcohol abuse 05/13/2017  ? Diabetic ketoacidosis without coma associated with type 2 diabetes mellitus ([place]) 04/27/2017  ? ?PCP:  [facility] [place]:   ?[place] ?[place] [address] ?Phone: [phone] Fax: [phone] ? ? ? ? ?Social Determinants of Health (SDOH) Interventions ?  ? ?Readmission Risk Interventions ? ?  01/08/2021  ?  1:22 PM  ?Readmission Risk Prevention Plan  ?Transportation Screening Complete  ?Social Work Consult for [place] Planning/Counseling Complete  ?Palliative Care Screening Not Applicable  ?Medication Review Press photographer) Complete  ? ? ? ?

## 2021-05-14 NOTE — Progress Notes (Signed)
PT Cancellation Note ? ?Patient Details ?Name: Shawn [name] ?MRN: [id number] ?DOB: 1955/03/22 ? ? ?Cancelled Treatment:    Reason Eval/Treat Not Completed: Other (comment).  Pt notes that he is being d/c today and does not need therapy services at this time.  Will re-attempt in PM if pt is still available. ? ? ?Gwenlyn Saran, PT, DPT ?05/14/21, 1:21 PM ? ?

## 2021-05-16 LAB — CULTURE, BLOOD (ROUTINE X 2)
Culture: NO GROWTH
Culture: NO GROWTH

## 2021-05-22 ENCOUNTER — Ambulatory Visit: Payer: No Typology Code available for payment source | Admitting: Family

## 2021-06-23 ENCOUNTER — Other Ambulatory Visit: Payer: Self-pay

## 2021-06-23 ENCOUNTER — Encounter: Payer: Self-pay | Admitting: Radiology

## 2021-06-23 ENCOUNTER — Emergency Department: Payer: No Typology Code available for payment source

## 2021-06-23 ENCOUNTER — Inpatient Hospital Stay
Admission: EM | Admit: 2021-06-23 | Discharge: 2021-07-07 | DRG: 286 | Disposition: A | Discharge status: Home / Self Care | Payer: No Typology Code available for payment source | Attending: Internal Medicine | Admitting: Internal Medicine

## 2021-06-23 DIAGNOSIS — I5022 Chronic systolic (congestive) heart failure: Secondary | ICD-10-CM | POA: Diagnosis present

## 2021-06-23 DIAGNOSIS — I509 Heart failure, unspecified: Principal | ICD-10-CM

## 2021-06-23 DIAGNOSIS — I5023 Acute on chronic systolic (congestive) heart failure: Secondary | ICD-10-CM | POA: Diagnosis present

## 2021-06-23 DIAGNOSIS — N189 Chronic kidney disease, unspecified: Secondary | ICD-10-CM | POA: Diagnosis present

## 2021-06-23 DIAGNOSIS — F1010 Alcohol abuse, uncomplicated: Secondary | ICD-10-CM | POA: Diagnosis present

## 2021-06-23 DIAGNOSIS — I130 Hypertensive heart and chronic kidney disease with heart failure and stage 1 through stage 4 chronic kidney disease, or unspecified chronic kidney disease: Principal | ICD-10-CM | POA: Diagnosis present

## 2021-06-23 DIAGNOSIS — N1831 Chronic kidney disease, stage 3a: Secondary | ICD-10-CM

## 2021-06-23 DIAGNOSIS — E11649 Type 2 diabetes mellitus with hypoglycemia without coma: Secondary | ICD-10-CM | POA: Diagnosis not present

## 2021-06-23 DIAGNOSIS — D509 Iron deficiency anemia, unspecified: Secondary | ICD-10-CM

## 2021-06-23 DIAGNOSIS — Z833 Family history of diabetes mellitus: Secondary | ICD-10-CM

## 2021-06-23 DIAGNOSIS — F17210 Nicotine dependence, cigarettes, uncomplicated: Secondary | ICD-10-CM | POA: Diagnosis present

## 2021-06-23 DIAGNOSIS — N1832 Chronic kidney disease, stage 3b: Secondary | ICD-10-CM | POA: Diagnosis present

## 2021-06-23 DIAGNOSIS — D631 Anemia in chronic kidney disease: Secondary | ICD-10-CM | POA: Diagnosis present

## 2021-06-23 DIAGNOSIS — N170 Acute kidney failure with tubular necrosis: Secondary | ICD-10-CM | POA: Diagnosis present

## 2021-06-23 DIAGNOSIS — N179 Acute kidney failure, unspecified: Secondary | ICD-10-CM | POA: Diagnosis present

## 2021-06-23 DIAGNOSIS — R601 Generalized edema: Secondary | ICD-10-CM | POA: Diagnosis not present

## 2021-06-23 DIAGNOSIS — E119 Type 2 diabetes mellitus without complications: Secondary | ICD-10-CM

## 2021-06-23 DIAGNOSIS — I5043 Acute on chronic combined systolic (congestive) and diastolic (congestive) heart failure: Secondary | ICD-10-CM

## 2021-06-23 DIAGNOSIS — R339 Retention of urine, unspecified: Secondary | ICD-10-CM | POA: Diagnosis not present

## 2021-06-23 DIAGNOSIS — Z8673 Personal history of transient ischemic attack (TIA), and cerebral infarction without residual deficits: Secondary | ICD-10-CM

## 2021-06-23 DIAGNOSIS — Z8249 Family history of ischemic heart disease and other diseases of the circulatory system: Secondary | ICD-10-CM

## 2021-06-23 DIAGNOSIS — Z888 Allergy status to other drugs, medicaments and biological substances status: Secondary | ICD-10-CM

## 2021-06-23 DIAGNOSIS — Z794 Long term (current) use of insulin: Secondary | ICD-10-CM

## 2021-06-23 DIAGNOSIS — I428 Other cardiomyopathies: Secondary | ICD-10-CM | POA: Diagnosis present

## 2021-06-23 DIAGNOSIS — E1165 Type 2 diabetes mellitus with hyperglycemia: Secondary | ICD-10-CM | POA: Diagnosis not present

## 2021-06-23 DIAGNOSIS — I10 Essential (primary) hypertension: Secondary | ICD-10-CM | POA: Diagnosis present

## 2021-06-23 DIAGNOSIS — E1122 Type 2 diabetes mellitus with diabetic chronic kidney disease: Secondary | ICD-10-CM | POA: Diagnosis present

## 2021-06-23 DIAGNOSIS — Z79899 Other long term (current) drug therapy: Secondary | ICD-10-CM

## 2021-06-23 DIAGNOSIS — R188 Other ascites: Secondary | ICD-10-CM | POA: Diagnosis present

## 2021-06-23 DIAGNOSIS — N5089 Other specified disorders of the male genital organs: Secondary | ICD-10-CM

## 2021-06-23 DIAGNOSIS — Z7982 Long term (current) use of aspirin: Secondary | ICD-10-CM

## 2021-06-23 DIAGNOSIS — J9601 Acute respiratory failure with hypoxia: Secondary | ICD-10-CM | POA: Diagnosis not present

## 2021-06-23 LAB — URINALYSIS, ROUTINE W REFLEX MICROSCOPIC
Bilirubin Urine: NEGATIVE
Glucose, UA: >=500 mg/dL — AB
Ketones, ur: NEGATIVE mg/dL
Nitrite: NEGATIVE
Protein, ur: >=300 mg/dL — AB
Specific Gravity, Urine: 1.022 (ref 1.005–1.030)
Squamous Epithelial / HPF: NONE SEEN (ref 0–5)
WBC, UA: >50 WBC/hpf — ABNORMAL HIGH (ref 0–5)
pH: 5 (ref 5.0–8.0)

## 2021-06-23 LAB — TROPONIN I (HIGH SENSITIVITY)
Troponin I (High Sensitivity): 68 ng/L — ABNORMAL HIGH (ref <18)
Troponin I (High Sensitivity): 60 ng/L — ABNORMAL HIGH (ref <18)

## 2021-06-23 LAB — HEPATIC FUNCTION PANEL
ALT: 21 U/L (ref 0–44)
AST: 36 U/L (ref 15–41)
Albumin: 2.6 g/dL — ABNORMAL LOW (ref 3.5–5.0)
Alkaline Phosphatase: 148 U/L — ABNORMAL HIGH (ref 38–126)
Bilirubin, Direct: <0.1 mg/dL (ref 0.0–0.2)
Total Bilirubin: 0.3 mg/dL (ref 0.3–1.2)
Total Protein: 6.6 g/dL (ref 6.5–8.1)

## 2021-06-23 LAB — CBC
HCT: 32.4 % — ABNORMAL LOW (ref 39.0–52.0)
Hemoglobin: 9.9 g/dL — ABNORMAL LOW (ref 13.0–17.0)
MCH: 28.5 pg (ref 26.0–34.0)
MCHC: 30.6 g/dL (ref 30.0–36.0)
MCV: 93.4 fL (ref 80.0–100.0)
Platelets: 382 10*3/uL (ref 150–400)
RBC: 3.47 MIL/uL — ABNORMAL LOW (ref 4.22–5.81)
RDW: 15.1 % (ref 11.5–15.5)
WBC: 5.7 10*3/uL (ref 4.0–10.5)
nRBC: 0 % (ref 0.0–0.2)

## 2021-06-23 LAB — BASIC METABOLIC PANEL
Anion gap: 8 (ref 5–15)
BUN: 28 mg/dL — ABNORMAL HIGH (ref 8–23)
CO2: 25 mmol/L (ref 22–32)
Calcium: 8.5 mg/dL — ABNORMAL LOW (ref 8.9–10.3)
Chloride: 105 mmol/L (ref 98–111)
Creatinine, Ser: 1.27 mg/dL — ABNORMAL HIGH (ref 0.61–1.24)
GFR, Estimated: >60 mL/min (ref >60)
Glucose, Bld: 387 mg/dL — ABNORMAL HIGH (ref 70–99)
Potassium: 3.5 mmol/L (ref 3.5–5.1)
Sodium: 138 mmol/L (ref 135–145)

## 2021-06-23 LAB — BRAIN NATRIURETIC PEPTIDE: B Natriuretic Peptide: 2973.2 pg/mL — ABNORMAL HIGH (ref 0.0–100.0)

## 2021-06-23 LAB — GLUCOSE, CAPILLARY: Glucose-Capillary: 322 mg/dL — ABNORMAL HIGH (ref 70–99)

## 2021-06-23 MED ORDER — INSULIN ASPART 100 UNIT/ML IJ SOLN
0.0000 [IU] | Freq: Every day | INTRAMUSCULAR | Status: DC
  Administered 2021-06-25: 2 [IU] via SUBCUTANEOUS
  Filled 2021-06-23 (×2): qty 1

## 2021-06-23 MED ORDER — ACETAMINOPHEN 500 MG PO TABS
1000.0000 mg | ORAL_TABLET | Freq: Once | ORAL | Status: AC
Start: 2021-06-23 — End: 2021-06-23
  Administered 2021-06-23: 1000 mg via ORAL
  Filled 2021-06-23: qty 2

## 2021-06-23 MED ORDER — INSULIN ASPART 100 UNIT/ML IJ SOLN
0.0000 [IU] | Freq: Three times a day (TID) | INTRAMUSCULAR | Status: DC
  Administered 2021-06-24: 3 [IU] via SUBCUTANEOUS
  Administered 2021-06-24 (×2): 2 [IU] via SUBCUTANEOUS
  Administered 2021-06-25 (×2): 5 [IU] via SUBCUTANEOUS
  Administered 2021-06-25: 8 [IU] via SUBCUTANEOUS
  Administered 2021-06-26: 3 [IU] via SUBCUTANEOUS
  Administered 2021-06-26 – 2021-06-27 (×2): 5 [IU] via SUBCUTANEOUS
  Administered 2021-06-27 – 2021-06-28 (×2): 2 [IU] via SUBCUTANEOUS
  Administered 2021-06-28 – 2021-06-29 (×2): 3 [IU] via SUBCUTANEOUS
  Administered 2021-06-30 (×3): 5 [IU] via SUBCUTANEOUS
  Administered 2021-07-01: 2 [IU] via SUBCUTANEOUS
  Administered 2021-07-01: 3 [IU] via SUBCUTANEOUS
  Administered 2021-07-02 (×2): 5 [IU] via SUBCUTANEOUS
  Administered 2021-07-02 – 2021-07-03 (×4): 3 [IU] via SUBCUTANEOUS
  Administered 2021-07-04: 15 [IU] via SUBCUTANEOUS
  Administered 2021-07-04: 5 [IU] via SUBCUTANEOUS
  Administered 2021-07-04: 11 [IU] via SUBCUTANEOUS
  Administered 2021-07-05 – 2021-07-06 (×3): 2 [IU] via SUBCUTANEOUS
  Administered 2021-07-07: 8 [IU] via SUBCUTANEOUS
  Administered 2021-07-07: 2 [IU] via SUBCUTANEOUS
  Filled 2021-06-23 (×33): qty 1

## 2021-06-23 MED ORDER — INSULIN GLARGINE-YFGN 100 UNIT/ML ~~LOC~~ SOPN
20.0000 [IU] | PEN_INJECTOR | Freq: Every day | SUBCUTANEOUS | Status: DC
  Filled 2021-06-23: qty 3

## 2021-06-23 MED ORDER — HYDROMORPHONE HCL 1 MG/ML IJ SOLN
0.5000 mg | Freq: Once | INTRAMUSCULAR | Status: AC
  Administered 2021-06-23: 0.5 mg via INTRAVENOUS
  Filled 2021-06-23: qty 0.5

## 2021-06-23 MED ORDER — IOHEXOL 350 MG/ML SOLN
75.0000 mL | Freq: Once | INTRAVENOUS | Status: AC | PRN
  Administered 2021-06-23: 75 mL via INTRAVENOUS

## 2021-06-23 MED ORDER — PANTOPRAZOLE SODIUM 40 MG PO TBEC: 40.0000 mg | DELAYED_RELEASE_TABLET | Freq: Every day | ORAL | Status: DC

## 2021-06-23 MED ORDER — CARVEDILOL 12.5 MG PO TABS
12.5000 mg | ORAL_TABLET | Freq: Two times a day (BID) | ORAL | Status: DC
  Administered 2021-06-23 – 2021-06-29 (×12): 12.5 mg via ORAL
  Filled 2021-06-23 (×2): qty 2
  Filled 2021-06-23: qty 1
  Filled 2021-06-23: qty 2
  Filled 2021-06-23: qty 1
  Filled 2021-06-23: qty 2
  Filled 2021-06-23 (×4): qty 1
  Filled 2021-06-23: qty 2
  Filled 2021-06-23: qty 1

## 2021-06-23 MED ORDER — ATORVASTATIN CALCIUM 20 MG PO TABS
40.0000 mg | ORAL_TABLET | Freq: Once | ORAL | Status: AC
  Administered 2021-06-23: 40 mg via ORAL
  Filled 2021-06-23: qty 2

## 2021-06-23 MED ORDER — FUROSEMIDE 10 MG/ML IJ SOLN
60.0000 mg | Freq: Once | INTRAMUSCULAR | Status: AC
  Administered 2021-06-23: 60 mg via INTRAVENOUS
  Filled 2021-06-23: qty 6

## 2021-06-23 MED ORDER — INSULIN GLARGINE-YFGN 100 UNIT/ML ~~LOC~~ SOLN
20.0000 [IU] | Freq: Every day | SUBCUTANEOUS | Status: DC
  Administered 2021-06-23: 20 [IU] via SUBCUTANEOUS
  Filled 2021-06-23 (×2): qty 0.2

## 2021-06-23 MED ORDER — FUROSEMIDE 10 MG/ML IJ SOLN
80.0000 mg | Freq: Once | INTRAMUSCULAR | Status: AC
  Administered 2021-06-23: 80 mg via INTRAVENOUS
  Filled 2021-06-23: qty 8

## 2021-06-23 MED ORDER — SODIUM CHLORIDE 0.9 % IV SOLN
1.0000 g | Freq: Once | INTRAVENOUS | Status: AC
  Administered 2021-06-23: 1 g via INTRAVENOUS
  Filled 2021-06-23: qty 10

## 2021-06-23 MED ORDER — ASPIRIN 81 MG PO CHEW
81.0000 mg | CHEWABLE_TABLET | Freq: Every day | ORAL | Status: DC
  Administered 2021-06-24 – 2021-07-07 (×14): 81 mg via ORAL
  Filled 2021-06-23 (×14): qty 1

## 2021-06-23 MED ORDER — ENOXAPARIN SODIUM 40 MG/0.4ML IJ SOSY
40.0000 mg | PREFILLED_SYRINGE | INTRAMUSCULAR | Status: DC
  Administered 2021-06-23 – 2021-06-25 (×3): 40 mg via SUBCUTANEOUS
  Filled 2021-06-23 (×2): qty 0.4

## 2021-06-23 NOTE — ED Notes (Signed)
Pt ate 75% of meal tray

## 2021-06-23 NOTE — ED Provider Notes (Signed)
[facility] Provider Note    Event Date/Time   First MD Initiated Contact with Patient 06/23/21 1349     (approximate)   History   Groin Swelling and Leg Swelling   HPI  Shawn [name] is a 66 y.o. male with diabetes, AKI, EF of 40 to 45%, polysubstance abuse, hypertension who comes in with concerns for groin and bilateral leg swelling.  Patient was recently taken off his fluid pill due to kidney dysfunction.  Patient reports that he has had significant swelling that started last night that was sudden onset in nature he is got little swelling in his legs but mostly in his groin with severe pain.  He denies this ever happening previously.  Has not been on any fluid pills for few months now.  He denies any chest pain or falls.  I reviewed patient's discharge summary from 05/14/2021 where patient was admitted for DKA   Physical Exam   Triage Vital Signs: ED Triage Vitals  Enc Vitals Group     BP 06/23/21 1109 (!) 142/97     Pulse Rate 06/23/21 1108 84     Resp 06/23/21 1108 18     Temp 06/23/21 1108 98.4 F (36.9 C)     Temp Source 06/23/21 1108 Oral     SpO2 06/23/21 1108 95 %     Weight 06/23/21 1108 130 lb 1.1 oz (59 kg)     Height 06/23/21 1108 5\' 7"  (1.702 m)     Head Circumference --      Peak Flow --      Pain Score 06/23/21 1108 8     Pain Loc --      Pain Edu? --      Excl. in [place]? --     Most recent vital signs: Vitals:   06/23/21 1108 06/23/21 1109  BP:  (!) 142/97  Pulse: 84 77  Resp: 18 18  Temp: 98.4 F (36.9 C) 98.2 F (36.8 C)  SpO2: 95% 97%     General: Awake, no distress.  CV:  Good peripheral perfusion.  Resp:  Normal effort.  Abd:  No distention.  Other:  Patient has 1+ edema bilateral legs with significant swelling noted in his testicles and his penile region.  He is got no obvious skin changes to suggest Fournier's gangrene.  However he does have a lot of pain in the area ED Results / Procedures / Treatments    Labs (all labs ordered are listed, but only abnormal results are displayed) Labs Reviewed  CBC - Abnormal; Notable for the following components:      Result Value   RBC 3.47 (*)    Hemoglobin 9.9 (*)    HCT 32.4 (*)    All other components within normal limits  BASIC METABOLIC PANEL - Abnormal; Notable for the following components:   Glucose, Bld 387 (*)    BUN 28 (*)    Creatinine, Ser 1.27 (*)    Calcium 8.5 (*)    All other components within normal limits  BRAIN NATRIURETIC PEPTIDE - Abnormal; Notable for the following components:   B Natriuretic Peptide 2,973.2 (*)    All other components within normal limits     EKG  My interpretation of EKG:  Sinus rate of 77 without any ST elevation, T wave inversion in V5 V6 with occasional PVC  RADIOLOGY I have reviewed the xray personally and interpreted and patient has pulmonary edema with small bilateral pleural effusions  PROCEDURES:  Critical Care performed: No  .1-3 Lead EKG Interpretation  Performed by: Vanessa [place], MD Authorized by: Vanessa [place], MD     Interpretation: normal     ECG rate:  80   ECG rate assessment: normal     Rhythm: sinus rhythm     Ectopy: none     Conduction: normal      MEDICATIONS ORDERED IN ED: Medications  HYDROmorphone (DILAUDID) injection 0.5 mg (0.5 mg Intravenous Given 06/23/21 1414)  furosemide (LASIX) injection 80 mg (80 mg Intravenous Given 06/23/21 1414)     IMPRESSION / MDM / ASSESSMENT AND PLAN / ED COURSE  I reviewed the triage vital signs and the nursing notes.   Patient's presentation is most consistent with acute presentation with potential threat to life or bodily function.   Differential includes ACS, heart failure.  Given how sudden onset it was I will also consider the possibility of Fournier's gangrene given he does have a history of diabetes I do not see any obvious skin changes to suggest this but will get CT and ultrasound to further evaluate given his  significant pain.  BNP significantly elevated at almost 3000.  CBC shows stable hemoglobin and no white count elevation.  BMP shows low bit of elevated creatinine but downtrending from prior.  Sugar was elevated at 387 but anion gap is normal so no evidence of DKA   Patient will require admission to the hospital for diuresis.  Patient handed off pending CT and ultrasound.  His troponin was slightly elevated but will get a repeat.  No chest pain we will hold off on heparin at this time given I suspect this is more likely just from fluid overload.  The patient is on the cardiac monitor to evaluate for evidence of arrhythmia and/or significant heart rate changes.      FINAL CLINICAL IMPRESSION(S) / ED DIAGNOSES   Final diagnoses:  Testicle swelling  Acute on chronic congestive heart failure, unspecified heart failure type ([place])     Rx / DC Orders   ED Discharge Orders     None        Note:  This document was prepared using Dragon voice recognition software and may include unintentional dictation errors.   Vanessa Celina, MD 06/23/21 [phone]

## 2021-06-23 NOTE — H&P (Signed)
History and Physical    Shawn [name]:[id number] DOB: Nov 13, 1955 DOA: 06/23/2021  PCP: [facility] [place]  Patient coming from: home  I have personally briefly reviewed patient's old medical records in [place]  Chief Complaint: scrotal swelling  HPI: Shawn [name] is a 66 y.o. male with medical history significant of poorly controlled t2dm, ckd 3b, combined CHF, smoking, alcohol abuse, hypertension, prior CVA, malnutrition who presented scrotal and leg swelling.  Pt reported acute scrotal swelling last night that caused pain and difficulty sitting.  Also admitted to swelling in abdomen and BLE that may have started prior to last night.  No dyspnea, no chest pain.  Denied eating salty foods.    Pt was hospitalized for DKA and AKI and discharged on 05/14/21 with home torsemide and Aldactone held pending PCP followup.  Pt said he hasn't followed up with his PCP because his PCP at [facility] told pt to change doctor, and then a [facility] nurse came to the house and told his sister to throw away his fluid pills.  So pt hasn't been on his diuretics for >1 month.     ED Course: initial vitals: afebrile, pulse 77, BP 142/97, RR 18, sating 97% on room air.  Labs notable for Cr 1.27, BNP 2973, trop 68 and 60.  CXR showed Small bilateral pleural effusions and Mild pulmonary edema.  CT a/p showed Severe fluid overload with large intra-abdominal ascites, and diffuse Anasarca.  US scrotum showed Pelvic and LEFT inguinal ascites, Bilateral hydroceles, and Scrotal edema.  Pt received IV lasix 80 mg in the ED and admitted for observation.   Assessment/Plan Principal Problem:   Anasarca  # Fluid overload and Anasarca --2/2 home torsemide and Aldactone not resumed due to no PCP followup (per pt, PCP wanted him to find another doctor). --BNP 2973, imaging studies showed pulm edema, ascites, scrotal edema.  Also has BLE and pedal edema. --s/p IV lasix 80 mg x1 in the ED Plan: --cont diuresis  with IV lasix 60 mg --Strict I/O --low sodium diet and fluid restriction to 1200 cc.  # Chronic combined CHF --Echo from Jan 2023 showed LVEF 40-45%, grade I DD. Plan: --cont IV diuresis --cont home coreg --hold home Entresto for now  # DM2 --needed sister's help in giving him insulin, so was discharged on glargine 20u daily.  Pt reported compliance with that. Plan: --cont glargine 20u daily --ACHS and SSI  # CKD stage IIIa --Cr 1.27 on presentation, which is baseline. --monitor Cr while diuresing  # UTI, ruled out --ceftriaxone x1 given by ED provider, however, no symptoms of UTI. --will not continue abx  # HTN --cont IV diuresis --cont home coreg --hold home Entresto for now   DVT prophylaxis: Lovenox SQ Code Status: Full code Family Communication:   Disposition Plan: home  Consults called:  Level of care: Med-Surg   Review of Systems: As per HPI otherwise complete review of systems negative.   Past Medical History:  Diagnosis Date   Chronic combined systolic (congestive) and diastolic (congestive) heart failure ([place])    a. TTE 6/15: EF < 20%, mildly dilated LV, DD, mildly dilated LA, mod dilated RA, mild MR, mild to mod TR, mod increased posterior wall thickness, elevated LA and LVEDP; b. 4/12019 Echo: EF 20-25%, diff HK. Gr1 DD, nl RV fxn.   CKD (chronic kidney disease), stage II    Diabetes mellitus with complication ([place])    a. Prior admissions w/ DKA (last 12/2017).  Essential hypertension    NICM (nonischemic cardiomyopathy) ([place])    a. 06/2013 Echo: EF<20%; b. 06/2013 Cath: no signif dzs; c. 04/2017 Echo: 20-25%, gr1 DD.; d.05/2018 Echo: 25-30%   Polysubstance abuse ([place])    a. etoh and tobacco   Stroke [facility])     Past Surgical History:  Procedure Laterality Date   NO PAST SURGERIES       reports that he has been smoking cigarettes. He has been smoking an average of .5 packs per day. He has never used smokeless tobacco. He reports that he does not  currently use alcohol. No history on file for drug use.  Allergies  Allergen Reactions   Pravastatin     Other reaction(s): Muscle pain   Simvastatin     Other reaction(s): Muscle pain    Family History  Problem Relation Age of Onset   Congestive Heart Failure Mother    Diabetes Mother    Congestive Heart Failure Brother     Prior to Admission medications   Medication Sig Start Date End Date Taking? Authorizing Provider  acetaminophen (TYLENOL) 325 MG tablet Take 2 tablets (650 mg total) by mouth every 6 (six) hours as needed for mild pain (or Fever >/= 101). 01/09/21   Barb Merino, MD  aspirin 81 MG chewable tablet Chew 1 tablet (81 mg total) by mouth daily. 04/30/17   Demetrios Loll, MD  atorvastatin (LIPITOR) 40 MG tablet TAKE ONE TABLET BY MOUTH AT BEDTIME FOR CHOLESTEROL 02/16/21   [provider]  carvedilol (COREG) 12.5 MG tablet Take 12.5 mg by mouth 2 (two) times daily with a meal.    [provider]  cholecalciferol (VITAMIN D) 25 MCG (1000 UNIT) tablet Take 2,000 Units by mouth daily. Mon    [provider]  feeding supplement, GLUCERNA SHAKE, (GLUCERNA SHAKE) LIQD Take 237 mLs by mouth 3 (three) times daily between meals. 01/09/21   Barb Merino, MD  insulin glargine-yfgn (SEMGLEE) 100 UNIT/ML Pen Inject 20 Units into the skin daily. 05/14/21   Enzo Bi, MD  Multiple Vitamin (MULTIVITAMIN WITH MINERALS) TABS tablet Take 1 tablet by mouth daily. 01/10/21   Barb Merino, MD  omeprazole (PRILOSEC) 20 MG capsule Take 40 mg by mouth 2 (two) times daily before a meal. 12/18/18   [provider]  sacubitril-valsartan (ENTRESTO) 49-51 MG Take 0.5 tablets by mouth daily.    [provider]  spironolactone (ALDACTONE) 25 MG tablet Hold until outpatient followup due to acute kidney injury. 05/14/21   Enzo Bi, MD  torsemide (DEMADEX) 20 MG tablet Hold until outpatient followup due to acute kidney injury. 05/14/21   Enzo Bi, MD    Physical  Exam: Vitals:   06/23/21 1108 06/23/21 1109 06/23/21 1415  BP:  (!) 142/97 (!) 156/96  Pulse: 84 77 82  Resp: 18 18 17   Temp: 98.4 F (36.9 C) 98.2 F (36.8 C) 98.4 F (36.9 C)  TempSrc: Oral Oral Oral  SpO2: 95% 97% 98%  Weight: 59 kg    Height: 5\' 7"  (1.702 m)      Constitutional: NAD, AAOx3 HEENT: conjunctivae and lids normal, EOMI CV: No cyanosis.   RESP: normal respiratory effort, on RA Extremities: edema in BLE and both feet SKIN: warm, dry Neuro: II - XII grossly intact.   Psych: Normal mood and affect.  Appropriate judgement and reason   Labs on Admission: I have personally reviewed following labs and imaging studies  CBC: Recent Labs  Lab 06/23/21 1110  WBC 5.7  HGB 9.9*  HCT 32.4*  MCV 93.4  PLT 643   Basic Metabolic Panel: Recent Labs  Lab 06/23/21 1110  NA 138  K 3.5  CL 105  CO2 25  GLUCOSE 387*  BUN 28*  CREATININE 1.27*  CALCIUM 8.5*   GFR: Estimated Creatinine Clearance: 48.4 mL/min (A) (by C-G formula based on SCr of 1.27 mg/dL (H)). Liver Function Tests: Recent Labs  Lab 06/23/21 1110  AST 36  ALT 21  ALKPHOS 148*  BILITOT 0.3  PROT 6.6  ALBUMIN 2.6*   No results for input(s): "LIPASE", "AMYLASE" in the last 168 hours. No results for input(s): "AMMONIA" in the last 168 hours. Coagulation Profile: No results for input(s): "INR", "PROTIME" in the last 168 hours. Cardiac Enzymes: No results for input(s): "CKTOTAL", "CKMB", "CKMBINDEX", "TROPONINI" in the last 168 hours. BNP (last 3 results) No results for input(s): "PROBNP" in the last 8760 hours. HbA1C: No results for input(s): "HGBA1C" in the last 72 hours. CBG: No results for input(s): "GLUCAP" in the last 168 hours. Lipid Profile: No results for input(s): "CHOL", "HDL", "LDLCALC", "TRIG", "CHOLHDL", "LDLDIRECT" in the last 72 hours. Thyroid Function Tests: No results for input(s): "TSH", "T4TOTAL", "FREET4", "T3FREE", "THYROIDAB" in the last 72 hours. Anemia Panel: No  results for input(s): "VITAMINB12", "FOLATE", "FERRITIN", "TIBC", "IRON", "RETICCTPCT" in the last 72 hours. Urine analysis:    Component Value Date/Time   COLORURINE YELLOW (A) 06/23/2021 1110   APPEARANCEUR TURBID (A) 06/23/2021 1110   LABSPEC 1.022 06/23/2021 1110   PHURINE 5.0 06/23/2021 1110   GLUCOSEU >=500 (A) 06/23/2021 1110   HGBUR SMALL (A) 06/23/2021 1110   BILIRUBINUR NEGATIVE 06/23/2021 1110   KETONESUR NEGATIVE 06/23/2021 1110   PROTEINUR >=300 (A) 06/23/2021 1110   NITRITE NEGATIVE 06/23/2021 1110   LEUKOCYTESUR LARGE (A) 06/23/2021 1110    Radiological Exams on Admission: US SCROTUM W/DOPPLER  Result Date: 06/23/2021 CLINICAL DATA:  Testicular swelling EXAM: SCROTAL ULTRASOUND DOPPLER ULTRASOUND OF THE TESTICLES TECHNIQUE: Complete ultrasound examination of the testicles, epididymis, and other scrotal structures was performed. Color and spectral Doppler ultrasound were also utilized to evaluate blood flow to the testicles. COMPARISON:  CT AP, concurrent. FINDINGS: Right testicle Measurements: 4.6 x 3.2 x 3.3 cm. No mass or microlithiasis visualized. Left testicle Measurements: 4.5 x 2.7 x 3.0 cm. No mass or microlithiasis visualized. Pulsed Doppler interrogation of both testes demonstrates normal low resistance arterial and venous waveforms bilaterally. Right epididymis:  Normal in size and appearance. Left epididymis: Normal in size and appearance. Tiny epididymal head cyst. Hydrocele:  Bilateral hydroceles. Varicocele:  None visualized. Scrotal edema. LEFT ascitic inguinal hernia. Pelvic ascites, incompletely imaged IMPRESSION: 1. No sonographic or Doppler evidence of testicular torsion 2. Pelvic and LEFT inguinal ascites. Bilateral hydroceles, likely ascitic in origin 3. Scrotal edema, likely anasarca. Electronically Signed   By: Michaelle Birks M.D.   On: 06/23/2021 16:01   CT ABDOMEN PELVIS W CONTRAST  Result Date: 06/23/2021 CLINICAL DATA:  Bilateral groin and leg swelling.  EXAM: CT ABDOMEN AND PELVIS WITH CONTRAST TECHNIQUE: Multidetector CT imaging of the abdomen and pelvis was performed using the standard protocol following bolus administration of intravenous contrast. RADIATION DOSE REDUCTION: This exam was performed according to the departmental dose-optimization program which includes automated exposure control, adjustment of the mA and/or kV according to patient size and/or use of iterative reconstruction technique. CONTRAST:  16mL OMNIPAQUE IOHEXOL 350 MG/ML SOLN COMPARISON:  CT abdomen pelvis dated January 12, 2019. FINDINGS: Lower chest: Cardiomegaly. Small bilateral pleural  effusions with adjacent subsegmental atelectasis in both posterior lower lobes. Hepatobiliary: No focal liver abnormality. Decompressed gallbladder without gallstones or wall thickening. No biliary dilatation. Pancreas: Unremarkable. No pancreatic ductal dilatation or surrounding inflammatory changes. Spleen: Normal in size without focal abnormality. Adrenals/Urinary Tract: Adrenal glands are unremarkable. Kidneys are normal, without renal calculi, focal lesion, or hydronephrosis. Bladder is unremarkable. Stomach/Bowel: Stomach is within normal limits. Appendix appears normal. No evidence of bowel wall thickening, distention, or inflammatory changes. Vascular/Lymphatic: Aortic atherosclerosis. No enlarged abdominal or pelvic lymph nodes. Reproductive: Prostate is unremarkable. Other: Large intra-abdominal ascites, increased compared to prior. No pneumoperitoneum. Small umbilical and bilateral inguinal hernias. Musculoskeletal: Diffuse severe anasarca. No acute or significant osseous findings. IMPRESSION: 1. Severe fluid overload with large intra-abdominal ascites, diffuse anasarca, and small bilateral pleural effusions. 2. Aortic Atherosclerosis (ICD10-I70.0). Electronically Signed   By: Titus Dubin M.D.   On: 06/23/2021 15:59   DG Chest 2 View  Result Date: 06/23/2021 CLINICAL DATA:  Fluid  overload, lower extremity swelling EXAM: CHEST - 2 VIEW COMPARISON:  05/11/2021 chest radiograph. FINDINGS: Stable cardiomediastinal silhouette with borderline mild cardiomegaly. No pneumothorax. Small bilateral pleural effusions. Mild pulmonary edema. Mild bibasilar curvilinear atelectasis. IMPRESSION: Mild congestive heart failure with small bilateral pleural effusions and bibasilar atelectasis. Electronically Signed   By: Ilona Sorrel M.D.   On: 06/23/2021 13:02      Enzo Bi MD [facility]  If 7PM-7AM, please contact night-coverage 06/23/2021, 6:17 PM

## 2021-06-23 NOTE — ED Notes (Signed)
Pt in US

## 2021-06-23 NOTE — ED Triage Notes (Signed)
Pt here with groin and bilateral leg swelling. Pt states this started last night. Pt not able to sit in chair without leaning due to the swelling and pain. Pt was recently taken off his fluid pill due to his kidney function but did not place him on a replacement diuretic.

## 2021-06-23 NOTE — ED Notes (Signed)
Pt has swelling to testicles and bilateral lower extremity

## 2021-06-23 NOTE — ED Notes (Signed)
This RN attempted trop. [place] here to take pt.

## 2021-06-23 NOTE — ED Notes (Signed)
Pt has urinal at bedside 

## 2021-06-24 ENCOUNTER — Encounter: Payer: Self-pay | Admitting: Hospitalist

## 2021-06-24 DIAGNOSIS — F1010 Alcohol abuse, uncomplicated: Secondary | ICD-10-CM | POA: Diagnosis present

## 2021-06-24 DIAGNOSIS — E1122 Type 2 diabetes mellitus with diabetic chronic kidney disease: Secondary | ICD-10-CM | POA: Diagnosis present

## 2021-06-24 DIAGNOSIS — Z833 Family history of diabetes mellitus: Secondary | ICD-10-CM | POA: Diagnosis not present

## 2021-06-24 DIAGNOSIS — N170 Acute kidney failure with tubular necrosis: Secondary | ICD-10-CM | POA: Diagnosis present

## 2021-06-24 DIAGNOSIS — N179 Acute kidney failure, unspecified: Secondary | ICD-10-CM | POA: Diagnosis not present

## 2021-06-24 DIAGNOSIS — I5043 Acute on chronic combined systolic (congestive) and diastolic (congestive) heart failure: Secondary | ICD-10-CM | POA: Diagnosis present

## 2021-06-24 DIAGNOSIS — D631 Anemia in chronic kidney disease: Secondary | ICD-10-CM | POA: Diagnosis present

## 2021-06-24 DIAGNOSIS — R339 Retention of urine, unspecified: Secondary | ICD-10-CM | POA: Diagnosis not present

## 2021-06-24 DIAGNOSIS — E1165 Type 2 diabetes mellitus with hyperglycemia: Secondary | ICD-10-CM | POA: Diagnosis not present

## 2021-06-24 DIAGNOSIS — Z888 Allergy status to other drugs, medicaments and biological substances status: Secondary | ICD-10-CM | POA: Diagnosis not present

## 2021-06-24 DIAGNOSIS — D509 Iron deficiency anemia, unspecified: Secondary | ICD-10-CM | POA: Diagnosis present

## 2021-06-24 DIAGNOSIS — I130 Hypertensive heart and chronic kidney disease with heart failure and stage 1 through stage 4 chronic kidney disease, or unspecified chronic kidney disease: Secondary | ICD-10-CM | POA: Diagnosis present

## 2021-06-24 DIAGNOSIS — Z794 Long term (current) use of insulin: Secondary | ICD-10-CM | POA: Diagnosis not present

## 2021-06-24 DIAGNOSIS — I428 Other cardiomyopathies: Secondary | ICD-10-CM | POA: Diagnosis present

## 2021-06-24 DIAGNOSIS — J9601 Acute respiratory failure with hypoxia: Secondary | ICD-10-CM | POA: Diagnosis not present

## 2021-06-24 DIAGNOSIS — Z79899 Other long term (current) drug therapy: Secondary | ICD-10-CM | POA: Diagnosis not present

## 2021-06-24 DIAGNOSIS — N189 Chronic kidney disease, unspecified: Secondary | ICD-10-CM | POA: Diagnosis not present

## 2021-06-24 DIAGNOSIS — R601 Generalized edema: Secondary | ICD-10-CM | POA: Diagnosis present

## 2021-06-24 DIAGNOSIS — R188 Other ascites: Secondary | ICD-10-CM | POA: Diagnosis present

## 2021-06-24 DIAGNOSIS — N1831 Chronic kidney disease, stage 3a: Secondary | ICD-10-CM | POA: Diagnosis not present

## 2021-06-24 DIAGNOSIS — E11649 Type 2 diabetes mellitus with hypoglycemia without coma: Secondary | ICD-10-CM | POA: Diagnosis not present

## 2021-06-24 DIAGNOSIS — F17210 Nicotine dependence, cigarettes, uncomplicated: Secondary | ICD-10-CM | POA: Diagnosis present

## 2021-06-24 DIAGNOSIS — Z8249 Family history of ischemic heart disease and other diseases of the circulatory system: Secondary | ICD-10-CM | POA: Diagnosis not present

## 2021-06-24 DIAGNOSIS — N1832 Chronic kidney disease, stage 3b: Secondary | ICD-10-CM | POA: Diagnosis present

## 2021-06-24 DIAGNOSIS — I10 Essential (primary) hypertension: Secondary | ICD-10-CM | POA: Diagnosis not present

## 2021-06-24 DIAGNOSIS — Z7982 Long term (current) use of aspirin: Secondary | ICD-10-CM | POA: Diagnosis not present

## 2021-06-24 DIAGNOSIS — Z8673 Personal history of transient ischemic attack (TIA), and cerebral infarction without residual deficits: Secondary | ICD-10-CM | POA: Diagnosis not present

## 2021-06-24 DIAGNOSIS — I5023 Acute on chronic systolic (congestive) heart failure: Secondary | ICD-10-CM | POA: Diagnosis not present

## 2021-06-24 LAB — MAGNESIUM: Magnesium: 2 mg/dL (ref 1.7–2.4)

## 2021-06-24 LAB — GLUCOSE, CAPILLARY
Glucose-Capillary: 171 mg/dL — ABNORMAL HIGH (ref 70–99)
Glucose-Capillary: 146 mg/dL — ABNORMAL HIGH (ref 70–99)
Glucose-Capillary: 125 mg/dL — ABNORMAL HIGH (ref 70–99)
Glucose-Capillary: 109 mg/dL — ABNORMAL HIGH (ref 70–99)

## 2021-06-24 LAB — BASIC METABOLIC PANEL
Anion gap: 5 (ref 5–15)
BUN: 27 mg/dL — ABNORMAL HIGH (ref 8–23)
CO2: 29 mmol/L (ref 22–32)
Calcium: 8.8 mg/dL — ABNORMAL LOW (ref 8.9–10.3)
Chloride: 107 mmol/L (ref 98–111)
Creatinine, Ser: 1.39 mg/dL — ABNORMAL HIGH (ref 0.61–1.24)
GFR, Estimated: 56 mL/min — ABNORMAL LOW (ref >60)
Glucose, Bld: 215 mg/dL — ABNORMAL HIGH (ref 70–99)
Potassium: 4 mmol/L (ref 3.5–5.1)
Sodium: 141 mmol/L (ref 135–145)

## 2021-06-24 LAB — URINE CULTURE

## 2021-06-24 LAB — CBC
HCT: 30.4 % — ABNORMAL LOW (ref 39.0–52.0)
Hemoglobin: 9.5 g/dL — ABNORMAL LOW (ref 13.0–17.0)
MCH: 28.6 pg (ref 26.0–34.0)
MCHC: 31.3 g/dL (ref 30.0–36.0)
MCV: 91.6 fL (ref 80.0–100.0)
Platelets: 365 10*3/uL (ref 150–400)
RBC: 3.32 MIL/uL — ABNORMAL LOW (ref 4.22–5.81)
RDW: 15 % (ref 11.5–15.5)
WBC: 6.6 10*3/uL (ref 4.0–10.5)
nRBC: 0 % (ref 0.0–0.2)

## 2021-06-24 LAB — HEMOGLOBIN A1C
Hgb A1c MFr Bld: 11.9 % — ABNORMAL HIGH (ref 4.8–5.6)
Mean Plasma Glucose: 294.83 mg/dL

## 2021-06-24 MED ORDER — ACETAMINOPHEN 500 MG PO TABS
1000.0000 mg | ORAL_TABLET | Freq: Three times a day (TID) | ORAL | Status: DC | PRN
  Administered 2021-06-25: 1000 mg via ORAL
  Filled 2021-06-24: qty 2

## 2021-06-24 MED ORDER — POLYETHYLENE GLYCOL 3350 17 G PO PACK: 17.0000 g | PACK | Freq: Two times a day (BID) | ORAL | Status: DC | PRN

## 2021-06-24 MED ORDER — TRAMADOL HCL 50 MG PO TABS
50.0000 mg | ORAL_TABLET | Freq: Four times a day (QID) | ORAL | Status: DC | PRN
  Administered 2021-06-24 – 2021-06-29 (×9): 50 mg via ORAL
  Filled 2021-06-24 (×9): qty 1

## 2021-06-24 MED ORDER — OXYCODONE HCL 5 MG PO TABS
5.0000 mg | ORAL_TABLET | Freq: Once | ORAL | Status: AC
  Administered 2021-06-24: 5 mg via ORAL
  Filled 2021-06-24: qty 1

## 2021-06-24 MED ORDER — DOCUSATE SODIUM 100 MG PO CAPS
100.0000 mg | ORAL_CAPSULE | Freq: Two times a day (BID) | ORAL | Status: DC | PRN
  Administered 2021-06-27: 100 mg via ORAL
  Filled 2021-06-24: qty 1

## 2021-06-24 MED ORDER — FUROSEMIDE 10 MG/ML IJ SOLN
60.0000 mg | Freq: Two times a day (BID) | INTRAMUSCULAR | Status: DC
  Administered 2021-06-24 (×2): 60 mg via INTRAVENOUS
  Filled 2021-06-24 (×2): qty 6

## 2021-06-24 NOTE — Progress Notes (Signed)
PROGRESS NOTE    Shawn [name]  KCL:[id number] DOB: 07-Sep-1955 DOA: 06/23/2021 PCP: [facility] [place]  114A/114A-AA   Assessment & Plan:   Principal Problem:   Anasarca   Shawn [name] is a 66 y.o. male with medical history significant of poorly controlled t2dm, ckd 3b, combined CHF, smoking, alcohol abuse, hypertension, prior CVA, malnutrition who presented scrotal and leg swelling.   # Fluid overload and Anasarca --2/2 home torsemide and Aldactone not resumed due to no PCP followup (per pt, PCP wanted him to find another doctor). --BNP 2973, imaging studies showed pulm edema, ascites, scrotal edema.  Also has BLE and pedal edema.  Swelling was severe and limiting his movements. --s/p IV lasix 80 mg x1 in the ED Plan: --cont IV lasix 60 mg BID --Strict I/O --low sodium diet and fluid restriction to 1200 cc.   # Chronic combined CHF --Echo from Jan 2023 showed LVEF 40-45%, grade I DD. Plan: --cont IV lasix 60 mg BID --cont home coreg --hold home Entresto for now   # DM2 --needed sister's help in giving him insulin, so was discharged on glargine 20u daily.  Pt reported compliance with that. Plan: --cont glargine 20u daily --SSI   # CKD stage IIIa --Cr 1.27 on presentation, which is baseline. --monitor Cr while diuresing   # UTI, ruled out --ceftriaxone x1 given by ED provider, however, no symptoms of UTI. --will not continue abx   # HTN --cont IV lasix 60 mg BID --cont home coreg --hold home Entresto for now   DVT prophylaxis: Lovenox SQ Code Status: Full code  Family Communication:  Level of care: Med-Surg Dispo:   The patient is from: home Anticipated d/c is to: home Anticipated d/c date is: 2-3 days Patient currently is not medically ready to d/c due to: IV lasix, severe swelling limiting movement   Subjective and Interval History:  Pt complained of pain in his groin area which limits his movement.   Objective: Vitals:    06/23/21 2100 06/24/21 0451 06/24/21 0815 06/24/21 1547  BP: (!) 151/97 138/85 133/85 120/76  Pulse: 72 69 68 66  Resp: 18 18 16 16   Temp: (!) 97.5 F (36.4 C) 97.8 F (36.6 C)  98.1 F (36.7 C)  TempSrc: Oral Oral  Oral  SpO2: 95% 91% 99% 98%  Weight:      Height:        Intake/Output Summary (Last 24 hours) at 06/24/2021 1743 Last data filed at 06/24/2021 1622 Gross per 24 hour  Intake 477 ml  Output 925 ml  Net -448 ml   Filed Weights   06/23/21 1108  Weight: 59 kg    Examination:   Constitutional: NAD, AAOx3 HEENT: conjunctivae and lids normal, EOMI CV: No cyanosis.   RESP: normal respiratory effort, on RA Extremities: severe edema in BLE and both feet SKIN: warm, dry Neuro: II - XII grossly intact.   Psych: Normal mood and affect.   GU: penile and scrotal swelling   Data Reviewed: I have personally reviewed following labs and imaging studies  CBC: Recent Labs  Lab 06/23/21 1110 06/24/21 0526  WBC 5.7 6.6  HGB 9.9* 9.5*  HCT 32.4* 30.4*  MCV 93.4 91.6  PLT 382 494   Basic Metabolic Panel: Recent Labs  Lab 06/23/21 1110 06/24/21 0526  NA 138 141  K 3.5 4.0  CL 105 107  CO2 25 29  GLUCOSE 387* 215*  BUN 28* 27*  CREATININE 1.27* 1.39*  CALCIUM 8.5*  8.8*  MG  --  2.0   GFR: Estimated Creatinine Clearance: 44.2 mL/min (A) (by C-G formula based on SCr of 1.39 mg/dL (H)). Liver Function Tests: Recent Labs  Lab 06/23/21 1110  AST 36  ALT 21  ALKPHOS 148*  BILITOT 0.3  PROT 6.6  ALBUMIN 2.6*   No results for input(s): "LIPASE", "AMYLASE" in the last 168 hours. No results for input(s): "AMMONIA" in the last 168 hours. Coagulation Profile: No results for input(s): "INR", "PROTIME" in the last 168 hours. Cardiac Enzymes: No results for input(s): "CKTOTAL", "CKMB", "CKMBINDEX", "TROPONINI" in the last 168 hours. BNP (last 3 results) No results for input(s): "PROBNP" in the last 8760 hours. HbA1C: Recent Labs    06/24/21 0526  HGBA1C  11.9*   CBG: Recent Labs  Lab 06/23/21 2102 06/24/21 0815 06/24/21 1216 06/24/21 1553  GLUCAP 322* 171* 146* 125*   Lipid Profile: No results for input(s): "CHOL", "HDL", "LDLCALC", "TRIG", "CHOLHDL", "LDLDIRECT" in the last 72 hours. Thyroid Function Tests: No results for input(s): "TSH", "T4TOTAL", "FREET4", "T3FREE", "THYROIDAB" in the last 72 hours. Anemia Panel: No results for input(s): "VITAMINB12", "FOLATE", "FERRITIN", "TIBC", "IRON", "RETICCTPCT" in the last 72 hours. Sepsis Labs: No results for input(s): "PROCALCITON", "LATICACIDVEN" in the last 168 hours.  No results found for this or any previous visit (from the past 240 hour(s)).    Radiology Studies: US SCROTUM W/DOPPLER  Result Date: 06/23/2021 CLINICAL DATA:  Testicular swelling EXAM: SCROTAL ULTRASOUND DOPPLER ULTRASOUND OF THE TESTICLES TECHNIQUE: Complete ultrasound examination of the testicles, epididymis, and other scrotal structures was performed. Color and spectral Doppler ultrasound were also utilized to evaluate blood flow to the testicles. COMPARISON:  CT AP, concurrent. FINDINGS: Right testicle Measurements: 4.6 x 3.2 x 3.3 cm. No mass or microlithiasis visualized. Left testicle Measurements: 4.5 x 2.7 x 3.0 cm. No mass or microlithiasis visualized. Pulsed Doppler interrogation of both testes demonstrates normal low resistance arterial and venous waveforms bilaterally. Right epididymis:  Normal in size and appearance. Left epididymis: Normal in size and appearance. Tiny epididymal head cyst. Hydrocele:  Bilateral hydroceles. Varicocele:  None visualized. Scrotal edema. LEFT ascitic inguinal hernia. Pelvic ascites, incompletely imaged IMPRESSION: 1. No sonographic or Doppler evidence of testicular torsion 2. Pelvic and LEFT inguinal ascites. Bilateral hydroceles, likely ascitic in origin 3. Scrotal edema, likely anasarca. Electronically Signed   By: Michaelle Birks M.D.   On: 06/23/2021 16:01   CT ABDOMEN PELVIS W  CONTRAST  Result Date: 06/23/2021 CLINICAL DATA:  Bilateral groin and leg swelling. EXAM: CT ABDOMEN AND PELVIS WITH CONTRAST TECHNIQUE: Multidetector CT imaging of the abdomen and pelvis was performed using the standard protocol following bolus administration of intravenous contrast. RADIATION DOSE REDUCTION: This exam was performed according to the departmental dose-optimization program which includes automated exposure control, adjustment of the mA and/or kV according to patient size and/or use of iterative reconstruction technique. CONTRAST:  35mL OMNIPAQUE IOHEXOL 350 MG/ML SOLN COMPARISON:  CT abdomen pelvis dated January 12, 2019. FINDINGS: Lower chest: Cardiomegaly. Small bilateral pleural effusions with adjacent subsegmental atelectasis in both posterior lower lobes. Hepatobiliary: No focal liver abnormality. Decompressed gallbladder without gallstones or wall thickening. No biliary dilatation. Pancreas: Unremarkable. No pancreatic ductal dilatation or surrounding inflammatory changes. Spleen: Normal in size without focal abnormality. Adrenals/Urinary Tract: Adrenal glands are unremarkable. Kidneys are normal, without renal calculi, focal lesion, or hydronephrosis. Bladder is unremarkable. Stomach/Bowel: Stomach is within normal limits. Appendix appears normal. No evidence of bowel wall thickening, distention, or inflammatory changes. Vascular/Lymphatic:  Aortic atherosclerosis. No enlarged abdominal or pelvic lymph nodes. Reproductive: Prostate is unremarkable. Other: Large intra-abdominal ascites, increased compared to prior. No pneumoperitoneum. Small umbilical and bilateral inguinal hernias. Musculoskeletal: Diffuse severe anasarca. No acute or significant osseous findings. IMPRESSION: 1. Severe fluid overload with large intra-abdominal ascites, diffuse anasarca, and small bilateral pleural effusions. 2. Aortic Atherosclerosis (ICD10-I70.0). Electronically Signed   By: Titus Dubin M.D.   On:  06/23/2021 15:59   DG Chest 2 View  Result Date: 06/23/2021 CLINICAL DATA:  Fluid overload, lower extremity swelling EXAM: CHEST - 2 VIEW COMPARISON:  05/11/2021 chest radiograph. FINDINGS: Stable cardiomediastinal silhouette with borderline mild cardiomegaly. No pneumothorax. Small bilateral pleural effusions. Mild pulmonary edema. Mild bibasilar curvilinear atelectasis. IMPRESSION: Mild congestive heart failure with small bilateral pleural effusions and bibasilar atelectasis. Electronically Signed   By: Ilona Sorrel M.D.   On: 06/23/2021 13:02     Scheduled Meds:  aspirin  81 mg Oral Daily   carvedilol  12.5 mg Oral BID WC   enoxaparin (LOVENOX) injection  40 mg Subcutaneous Q24H   furosemide  60 mg Intravenous BID   insulin aspart  0-15 Units Subcutaneous TID WC   insulin aspart  0-5 Units Subcutaneous QHS   insulin glargine-yfgn  20 Units Subcutaneous QHS   Continuous Infusions:   LOS: 0 days     Enzo Bi, MD [facility] If 7PM-7AM, please contact night-coverage 06/24/2021, 5:43 PM

## 2021-06-24 NOTE — TOC Initial Note (Signed)
Transition of Care [facility]) - Initial/Assessment Note    Patient Details  Name: Shawn [name] MRN: [id number] Date of Birth: 25-Sep-1955  Transition of [facility]) CM/SW Contact:    Beverly Sessions, RN Phone Number: 06/24/2021, 3:00 PM  Clinical Narrative:                 Admitted KGY:JEHUDJSH Admitted from: home with sister Ametia - [address] [place] PCP: [facility] [place].  Per patient and sister patient is followed by the [facility], but the provider has notified them they need to find a new provider.  In the mean time patient can still go to the [place].  Sister states she would like for his PCP to be closer.  I have provided her with contact information to his secondary insurance to determine if he has coverage/what providers are in network   Current home health/prior home health/DME:  Home with with [place], RN, PT aide.  Will need resumption orders  Patient has RW, cane in the home  Sisters to transport at discharge   Expected Discharge Plan: [place] Barriers to Discharge: Continued Medical Work up   Patient Goals and CMS Choice        Expected Discharge Plan and Services Expected Discharge Plan: [place]       Living arrangements for the past 2 months: Single Family Home                                      Prior Living Arrangements/Services Living arrangements for the past 2 months: Single Family Home Lives with:: Siblings              Current home services: Home PT, DME, Home RN, Homehealth aide    Activities of Daily Living Home Assistive Devices/Equipment: Environmental consultant (specify type), Eyeglasses ADL Screening (condition at time of admission) Patient's cognitive ability adequate to safely complete daily activities?: Yes Is the patient deaf or have difficulty hearing?: No Does the patient have difficulty seeing, even when wearing glasses/contacts?: No Does the patient have difficulty  concentrating, remembering, or making decisions?: No Patient able to express need for assistance with ADLs?: Yes Does the patient have difficulty dressing or bathing?: No Independently performs ADLs?: Yes (appropriate for developmental age) Does the patient have difficulty walking or climbing stairs?: No Weakness of Legs: None Weakness of Arms/Hands: None  Permission Sought/Granted                  Emotional Assessment              Admission diagnosis:  Anasarca [R60.1] Testicle swelling [N50.89] Acute on chronic congestive heart failure, unspecified heart failure type ([place]) [I50.9] Patient Active Problem List   Diagnosis Date Noted   Anasarca 06/23/2021   DKA (diabetic ketoacidosis) ([place]) 05/11/2021   Confusion    Hyperosmolar hyperglycemic state (HHS) ([place]) 70/26/3785   Acute metabolic encephalopathy 88/50/2774   Elevated troponin 03/16/2020   HLD (hyperlipidemia) 03/16/2020   Abnormal CXR 03/16/2020   Hypothermia 03/16/2020   Hypotension 04/30/2019   Stroke ([place])    GERD (gastroesophageal reflux disease)    Elevated lactic acid level    Dehydration    Protein-calorie malnutrition, severe ([place]) 04/25/2019   Hyperosmolar non-ketotic state due to type 2 diabetes mellitus ([place]) 04/23/2019   Hyperglycemia due to type 2 diabetes mellitus ([place]) 04/23/2019  Hyperkalemia 04/23/2019   Acute renal failure superimposed on stage 3b chronic kidney disease ([place]) 04/23/2019   Diarrhea 04/23/2019   Congestive heart failure with cardiomyopathy ([place]) 01/18/2019   AKI (acute kidney injury) ([place])    Fluid retention    Nonketotic hyperglycinemia ([place]) 09/22/2018   Nonketotic hypoglycemia 09/22/2018   DKA, type 2 ([place]) 08/07/2018   Essential hypertension 63/89/3734   Chronic systolic CHF (congestive heart failure) ([place]) 12/15/2017   NICM (nonischemic cardiomyopathy) ([place]) 05/13/2017   Type 2 diabetes mellitus with stage 3b chronic kidney disease, with long-term current use of  insulin ([place]) 05/13/2017   Smoker 05/13/2017   Polysubstance abuse ([place]) 05/13/2017   Alcohol abuse 05/13/2017   Diabetic ketoacidosis without coma associated with type 2 diabetes mellitus ([place]) 04/27/2017   PCP:  [facility] [place], [place] [address] Phone: [phone] Fax: [phone]     Social Determinants of Health (SDOH) Interventions    Readmission Risk Interventions    06/24/2021    2:57 PM 01/08/2021    1:22 PM  Readmission Risk Prevention Plan  Transportation Screening Complete Complete  Social Work Consult for [place] Planning/Counseling  Complete  Palliative Care Screening  Not Applicable  Medication Review Press photographer) Complete Complete  Palliative Care Screening Not [place] Not Applicable

## 2021-06-25 ENCOUNTER — Inpatient Hospital Stay: Payer: No Typology Code available for payment source

## 2021-06-25 DIAGNOSIS — R601 Generalized edema: Secondary | ICD-10-CM | POA: Diagnosis not present

## 2021-06-25 LAB — BODY FLUID CELL COUNT WITH DIFFERENTIAL
Eos, Fluid: 0 %
Lymphs, Fluid: 79 %
Monocyte-Macrophage-Serous Fluid: 20 %
Neutrophil Count, Fluid: 1 %
Total Nucleated Cell Count, Fluid: 1819 cu mm

## 2021-06-25 LAB — BASIC METABOLIC PANEL
Anion gap: 6 (ref 5–15)
BUN: 32 mg/dL — ABNORMAL HIGH (ref 8–23)
CO2: 25 mmol/L (ref 22–32)
Calcium: 8.7 mg/dL — ABNORMAL LOW (ref 8.9–10.3)
Chloride: 106 mmol/L (ref 98–111)
Creatinine, Ser: 1.78 mg/dL — ABNORMAL HIGH (ref 0.61–1.24)
GFR, Estimated: 42 mL/min — ABNORMAL LOW (ref >60)
Glucose, Bld: 225 mg/dL — ABNORMAL HIGH (ref 70–99)
Potassium: 4.7 mmol/L (ref 3.5–5.1)
Sodium: 137 mmol/L (ref 135–145)

## 2021-06-25 LAB — MAGNESIUM: Magnesium: 2 mg/dL (ref 1.7–2.4)

## 2021-06-25 LAB — GLUCOSE, CAPILLARY
Glucose-Capillary: 230 mg/dL — ABNORMAL HIGH (ref 70–99)
Glucose-Capillary: 272 mg/dL — ABNORMAL HIGH (ref 70–99)
Glucose-Capillary: 234 mg/dL — ABNORMAL HIGH (ref 70–99)
Glucose-Capillary: 215 mg/dL — ABNORMAL HIGH (ref 70–99)

## 2021-06-25 LAB — CBC
HCT: 30.4 % — ABNORMAL LOW (ref 39.0–52.0)
Hemoglobin: 9.6 g/dL — ABNORMAL LOW (ref 13.0–17.0)
MCH: 28.6 pg (ref 26.0–34.0)
MCHC: 31.6 g/dL (ref 30.0–36.0)
MCV: 90.5 fL (ref 80.0–100.0)
Platelets: 351 10*3/uL (ref 150–400)
RBC: 3.36 MIL/uL — ABNORMAL LOW (ref 4.22–5.81)
RDW: 15.1 % (ref 11.5–15.5)
WBC: 5.6 10*3/uL (ref 4.0–10.5)
nRBC: 0 % (ref 0.0–0.2)

## 2021-06-25 MED ORDER — INSULIN GLARGINE-YFGN 100 UNIT/ML ~~LOC~~ SOLN
20.0000 [IU] | Freq: Every day | SUBCUTANEOUS | Status: DC
Start: 2021-06-25 — End: 2021-06-26
  Administered 2021-06-25: 20 [IU] via SUBCUTANEOUS
  Filled 2021-06-25 (×2): qty 0.2

## 2021-06-25 MED ORDER — ALBUMIN HUMAN 25 % IV SOLN
50.0000 g | Freq: Once | INTRAVENOUS | Status: AC
Start: 2021-06-25 — End: 2021-06-25
  Administered 2021-06-25 (×2): 25 g via INTRAVENOUS
  Filled 2021-06-25: qty 200

## 2021-06-25 NOTE — Progress Notes (Signed)
PROGRESS NOTE    Shawn [name]  TJQ:[id number] DOB: 1955/12/25 DOA: 06/23/2021 PCP: [facility]  [id number]   Assessment & Plan:   Principal Problem:   Anasarca   Shawn [name] is a 66 y.o. male with medical history significant of poorly controlled t2dm, ckd 3b, combined CHF, smoking, alcohol abuse, hypertension, prior CVA, malnutrition who presented scrotal and leg swelling.   # Fluid overload and Anasarca --2/2 home torsemide and Aldactone not resumed due to no PCP followup (per pt, PCP wanted him to find another doctor). --BNP 2973, imaging studies showed pulm edema, ascites, scrotal edema.  Also has BLE and pedal edema.  Swelling was severe and limiting his movements. --s/p IV lasix 80 mg x1 in the ED, and IV lasix 60 mg x2, and Cr already went up this morning. Plan: --nephro consult for possible lasix gtt --Strict I/O --low sodium diet and fluid restriction to 1200 cc.   Ascites --no hx of cirrhosis.  Likely due to volume overload --paracentesis today, 5.3L removed, IV albumin 50 g given afterwards  # Chronic combined CHF --Echo from Jan 2023 showed LVEF 40-45%, grade I DD. Plan: --cont home coreg --hold home Entresto for now --Repeat Echo   # DM2 --needed sister's help in giving him insulin, so was discharged on glargine 20u daily.  Pt reported compliance with that. Plan: --cont glargine 20u daily --SSI   # AKI  CKD stage IIIa --Cr 1.27 on presentation, which is baseline. --Cr went up to 1.78 this morning Plan: --nephro consult to help with diuresis   # UTI, ruled out --ceftriaxone x1 given by ED provider, however, no symptoms of UTI. --will not continue abx   # HTN --cont home coreg --hold home Entresto for now   DVT prophylaxis: Lovenox SQ Code Status: Full code  Family Communication:  Level of care: Progressive Dispo:   The patient is from: home Anticipated d/c is to: home Anticipated d/c date is:  undetermined   Subjective and Interval History:  Pt reported urine output not as good as when he was in the ED, however, Cr increased this morning.  Scrotal and penile swelling worse today.  Went for paracentesis today.  Nephro consult today.   Objective: Vitals:   06/25/21 1149 06/25/21 1217 06/25/21 1548 06/25/21 1800  BP: 134/78 134/80 (!) 143/83 (!) 150/98  Pulse: 76 76 87 92  Resp: 16 18 16 19   Temp:  98.8 F (37.1 C) 97.6 F (36.4 C) 98.2 F (36.8 C)  TempSrc:    Oral  SpO2: 97% 100% 94% 92%  Weight:      Height:        Intake/Output Summary (Last 24 hours) at 06/25/2021 1907 Last data filed at 06/25/2021 1652 Gross per 24 hour  Intake 951.76 ml  Output 150 ml  Net 801.76 ml   Filed Weights   06/23/21 1108  Weight: 59 kg    Examination:   Constitutional: NAD, AAOx3 HEENT: conjunctivae and lids normal, EOMI CV: No cyanosis.   RESP: normal respiratory effort, on RA Extremities: edema in BLE and both feet SKIN: warm, dry Neuro: II - XII grossly intact.   Psych: Normal mood and affect.   GU: scrotal and penile swelling worse today   Data Reviewed: I have personally reviewed following labs and imaging studies  CBC: Recent Labs  Lab 06/23/21 1110 06/24/21 0526 06/25/21 0506  WBC 5.7 6.6 5.6  HGB 9.9* 9.5* 9.6*  HCT 32.4* 30.4* 30.4*  MCV 93.4  91.6 90.5  PLT 382 365 295   Basic Metabolic Panel: Recent Labs  Lab 06/23/21 1110 06/24/21 0526 06/25/21 0506  NA 138 141 137  K 3.5 4.0 4.7  CL 105 107 106  CO2 25 29 25   GLUCOSE 387* 215* 225*  BUN 28* 27* 32*  CREATININE 1.27* 1.39* 1.78*  CALCIUM 8.5* 8.8* 8.7*  MG  --  2.0 2.0   GFR: Estimated Creatinine Clearance: 34.5 mL/min (A) (by C-G formula based on SCr of 1.78 mg/dL (H)). Liver Function Tests: Recent Labs  Lab 06/23/21 1110  AST 36  ALT 21  ALKPHOS 148*  BILITOT 0.3  PROT 6.6  ALBUMIN 2.6*   No results for input(s): "LIPASE", "AMYLASE" in the last 168 hours. No results for  input(s): "AMMONIA" in the last 168 hours. Coagulation Profile: No results for input(s): "INR", "PROTIME" in the last 168 hours. Cardiac Enzymes: No results for input(s): "CKTOTAL", "CKMB", "CKMBINDEX", "TROPONINI" in the last 168 hours. BNP (last 3 results) No results for input(s): "PROBNP" in the last 8760 hours. HbA1C: Recent Labs    06/24/21 0526  HGBA1C 11.9*   CBG: Recent Labs  Lab 06/24/21 1553 06/24/21 2127 06/25/21 0818 06/25/21 1216 06/25/21 1550  GLUCAP 125* 109* 230* 272* 234*   Lipid Profile: No results for input(s): "CHOL", "HDL", "LDLCALC", "TRIG", "CHOLHDL", "LDLDIRECT" in the last 72 hours. Thyroid Function Tests: No results for input(s): "TSH", "T4TOTAL", "FREET4", "T3FREE", "THYROIDAB" in the last 72 hours. Anemia Panel: No results for input(s): "VITAMINB12", "FOLATE", "FERRITIN", "TIBC", "IRON", "RETICCTPCT" in the last 72 hours. Sepsis Labs: No results for input(s): "PROCALCITON", "LATICACIDVEN" in the last 168 hours.  Recent Results (from the past 240 hour(s))  Urine Culture     Status: Abnormal   Collection Time: 06/23/21 11:10 AM   Specimen: Urine, Clean Catch  Result Value Ref Range Status   Specimen Description   Final    URINE, CLEAN CATCH Performed at [facility], [address] [place], [place] [address]    Special Requests   Final    NONE Performed at [facility], [address] [place], [place] [address]    Culture MULTIPLE SPECIES PRESENT, SUGGEST RECOLLECTION (A)  Final   Report Status 06/24/2021 FINAL  Final  Body fluid culture w Gram Stain     Status: None (Preliminary result)   Collection Time: 06/25/21 11:29 AM   Specimen: Peritoneal Washings  Result Value Ref Range Status   Specimen Description   Final    PERITONEAL Performed at [facility], [address], [place], [place] [address]    Special Requests   Final    NONE Performed at [facility], [address] [place],  [place] [address]    Gram Stain   Final    NO WBC SEEN NO ORGANISMS SEEN Performed at [facility], [place] [address] [place],  [address]    Culture PENDING  Incomplete   Report Status PENDING  Incomplete      Radiology Studies: US Paracentesis  Result Date: 06/25/2021 INDICATION: Patient with history of CHF admitted with fluid overload and ascites on CT. Request to IR for diagnostic and therapeutic paracentesis. EXAM: ULTRASOUND GUIDED DIAGNOSTIC AND THERAPEUTIC PARACENTESIS MEDICATIONS: 10 mL 1% lidocaine COMPLICATIONS: None immediate. PROCEDURE: Informed written consent was obtained from the patient after a discussion of the risks, benefits and alternatives to treatment. A timeout was performed prior to the initiation of the procedure. Initial ultrasound scanning demonstrates a large amount  of ascites within the right lower abdominal quadrant. The right lower abdomen was prepped and draped in the usual sterile fashion. 1% lidocaine was used for local anesthesia. Following this, a 19 gauge, 7-cm, Yueh catheter was introduced. An ultrasound image was saved for documentation purposes. The paracentesis was performed. The catheter was removed and a dressing was applied. The patient tolerated the procedure well without immediate post procedural complication. FINDINGS: A total of approximately 5.3 L of clear yellow fluid was removed. Samples were sent to the laboratory as requested by the clinical team. IMPRESSION: Successful ultrasound-guided paracentesis yielding 5.3 liters of peritoneal fluid. Read by Candiss Norse, PA-C Electronically Signed   By: Michaelle Birks M.D.   On: 06/25/2021 13:22     Scheduled Meds:  aspirin  81 mg Oral Daily   carvedilol  12.5 mg Oral BID WC   enoxaparin (LOVENOX) injection  40 mg Subcutaneous Q24H   insulin aspart  0-15 Units Subcutaneous TID WC   insulin aspart  0-5 Units Subcutaneous QHS   insulin glargine-yfgn  20 Units Subcutaneous Daily   Continuous  Infusions:   LOS: 1 day     Enzo Bi, MD [facility] If 7PM-7AM, please contact night-coverage 06/25/2021, 7:07 PM

## 2021-06-25 NOTE — Procedures (Signed)
PROCEDURE SUMMARY:  Successful image-guided paracentesis from the right lower abdomen.  Yielded 5.3 liters of clear yellow fluid.  No immediate complications.  EBL < 1 mL Patient tolerated well.   Specimen was sent for labs.  Please see imaging section of Epic for full dictation.  Joaquim Nam PA-C 06/25/2021 11:28 AM

## 2021-06-25 NOTE — Progress Notes (Signed)
Mobility Specialist - Progress Note   06/25/21 1552  Mobility  Activity Off unit     Pt off unit at time of arrival per RN, will attempt at another date and time.  Merrily Brittle Mobility Specialist 06/25/21, 3:53 PM

## 2021-06-25 NOTE — Progress Notes (Signed)
Patient received from 1C. Vital within normal limits. He refused his admission assessment upon arrival to the floor. Reported off to on coming nurse.

## 2021-06-26 ENCOUNTER — Inpatient Hospital Stay: Payer: No Typology Code available for payment source

## 2021-06-26 ENCOUNTER — Inpatient Hospital Stay (HOSPITAL_COMMUNITY)
Admit: 2021-06-26 | Discharge: 2021-06-26 | Disposition: A | Discharge status: Home / Self Care | Payer: No Typology Code available for payment source | Attending: Hospitalist | Admitting: Hospitalist

## 2021-06-26 DIAGNOSIS — R601 Generalized edema: Secondary | ICD-10-CM | POA: Diagnosis not present

## 2021-06-26 LAB — ECHOCARDIOGRAM COMPLETE
AR max vel: 3.2 cm2
AV Area VTI: 2.91 cm2
AV Area mean vel: 2.87 cm2
AV Mean grad: 2 mmHg
AV Peak grad: 4.4 mmHg
Ao pk vel: 1.05 m/s
Area-P 1/2: 7.82 cm2
Calc EF: 31.1 %
Height: 67 in
MV M vel: 5.69 m/s
MV Peak grad: 129.5 mmHg
Radius: 0.5 cm
S' Lateral: 4.65 cm
Single Plane A2C EF: 39.3 %
Single Plane A4C EF: 24.3 %
Weight: 2081.14 oz

## 2021-06-26 LAB — BASIC METABOLIC PANEL
Anion gap: 5 (ref 5–15)
BUN: 37 mg/dL — ABNORMAL HIGH (ref 8–23)
CO2: 26 mmol/L (ref 22–32)
Calcium: 8.6 mg/dL — ABNORMAL LOW (ref 8.9–10.3)
Chloride: 107 mmol/L (ref 98–111)
Creatinine, Ser: 2.46 mg/dL — ABNORMAL HIGH (ref 0.61–1.24)
GFR, Estimated: 28 mL/min — ABNORMAL LOW (ref >60)
Glucose, Bld: 73 mg/dL (ref 70–99)
Potassium: 3.7 mmol/L (ref 3.5–5.1)
Sodium: 138 mmol/L (ref 135–145)

## 2021-06-26 LAB — GLUCOSE, CAPILLARY
Glucose-Capillary: 55 mg/dL — ABNORMAL LOW (ref 70–99)
Glucose-Capillary: 56 mg/dL — ABNORMAL LOW (ref 70–99)
Glucose-Capillary: 60 mg/dL — ABNORMAL LOW (ref 70–99)
Glucose-Capillary: 82 mg/dL (ref 70–99)
Glucose-Capillary: 188 mg/dL — ABNORMAL HIGH (ref 70–99)
Glucose-Capillary: 220 mg/dL — ABNORMAL HIGH (ref 70–99)
Glucose-Capillary: 249 mg/dL — ABNORMAL HIGH (ref 70–99)
Glucose-Capillary: 197 mg/dL — ABNORMAL HIGH (ref 70–99)

## 2021-06-26 LAB — CBC
HCT: 27.9 % — ABNORMAL LOW (ref 39.0–52.0)
Hemoglobin: 8.8 g/dL — ABNORMAL LOW (ref 13.0–17.0)
MCH: 28.5 pg (ref 26.0–34.0)
MCHC: 31.5 g/dL (ref 30.0–36.0)
MCV: 90.3 fL (ref 80.0–100.0)
Platelets: 338 10*3/uL (ref 150–400)
RBC: 3.09 MIL/uL — ABNORMAL LOW (ref 4.22–5.81)
RDW: 14.8 % (ref 11.5–15.5)
WBC: 5.2 10*3/uL (ref 4.0–10.5)
nRBC: 0 % (ref 0.0–0.2)

## 2021-06-26 LAB — MAGNESIUM: Magnesium: 2 mg/dL (ref 1.7–2.4)

## 2021-06-26 MED ORDER — HEPARIN SODIUM (PORCINE) 5000 UNIT/ML IJ SOLN
5000.0000 [IU] | Freq: Three times a day (TID) | INTRAMUSCULAR | Status: DC
  Administered 2021-06-26 – 2021-07-07 (×22): 5000 [IU] via SUBCUTANEOUS
  Filled 2021-06-26 (×28): qty 1

## 2021-06-26 MED ORDER — SPIRONOLACTONE 25 MG PO TABS
25.0000 mg | ORAL_TABLET | Freq: Every day | ORAL | Status: DC
  Administered 2021-06-26 – 2021-06-27 (×2): 25 mg via ORAL
  Filled 2021-06-26 (×2): qty 1

## 2021-06-26 MED ORDER — TORSEMIDE 20 MG PO TABS
20.0000 mg | ORAL_TABLET | Freq: Every day | ORAL | Status: DC
  Administered 2021-06-26 – 2021-06-28 (×3): 20 mg via ORAL
  Filled 2021-06-26 (×3): qty 1

## 2021-06-26 MED ORDER — ENOXAPARIN SODIUM 30 MG/0.3ML IJ SOSY: 30.0000 mg | PREFILLED_SYRINGE | INTRAMUSCULAR | Status: DC

## 2021-06-26 MED ORDER — GLUCOSE 40 % PO GEL
1.0000 | ORAL | Status: AC
  Administered 2021-06-26: 31 g via ORAL
  Filled 2021-06-26: qty 1

## 2021-06-26 MED ORDER — INSULIN GLARGINE-YFGN 100 UNIT/ML ~~LOC~~ SOLN
10.0000 [IU] | Freq: Every day | SUBCUTANEOUS | Status: DC
  Filled 2021-06-26: qty 0.1

## 2021-06-26 MED ORDER — INSULIN GLARGINE-YFGN 100 UNIT/ML ~~LOC~~ SOLN
10.0000 [IU] | Freq: Every day | SUBCUTANEOUS | Status: DC
Start: 2021-06-26 — End: 2021-06-30
  Administered 2021-06-26 – 2021-06-29 (×4): 10 [IU] via SUBCUTANEOUS
  Filled 2021-06-26 (×5): qty 0.1

## 2021-06-26 MED ORDER — INSULIN GLARGINE-YFGN 100 UNIT/ML ~~LOC~~ SOLN
15.0000 [IU] | Freq: Every day | SUBCUTANEOUS | Status: DC
  Filled 2021-06-26: qty 0.15

## 2021-06-26 NOTE — Progress Notes (Signed)
[facility]  ROUNDING NOTE   Subjective:   Shawn [name] is a 66 y.o. AA male with past medical conditions of CVA, diabetes, CHF, hypertension, and chronic kidney disease Iib. Patient presented to ED with lower extremity swelling and scrotal. Patient is admitted for Anasarca [R60.1] Testicle swelling [N50.89] Acute on chronic congestive heart failure, unspecified heart failure type [facility]) [I50.9]  Patient is known to our clinic and is followed by Dr Thedore Mins. He states he was taken off his diuretics and has not taken any. He began noticing the swelling 3-4 days ago. He denies shortness of breath. Denies nausea, vomiting and diarrhea. Denies chest pain.   Labs on ED arrival include glucose 387, BUN 28, and creatinine 1.27. Creatinine has risen to 2.46. CT CT abdomen pelvis shows small volume fluid overload with ascites, diffuse anasarca.  Chest x-ray shows congestive heart failure with small bilateral pleural effusions.  Paracentesis performed with 5.3 L fluid removed   Objective:  Vital signs in last 24 hours:  Temp:  [97.6 F (36.4 C)-98.7 F (37.1 C)] 98.7 F (37.1 C) (06/23 1206) Pulse Rate:  [79-92] 81 (06/23 1206) Resp:  [16-19] 18 (06/23 1206) BP: (132-156)/(79-98) 144/79 (06/23 1206) SpO2:  [91 %-96 %] 93 % (06/23 1206) Weight:  [74.3 kg] 74.3 kg (06/23 1100)  Weight change:  Filed Weights   06/23/21 1108 06/26/21 1100  Weight: 59 kg 74.3 kg    Intake/Output: I/O last 3 completed shifts: In: 1191.8 [P.O.:960; IV Piggyback:231.8] Out: 251 [Urine:250; Stool:1]   Intake/Output this shift:  Total I/O In: 822 [P.O.:822] Out: 75 [Urine:75]  Physical Exam: General: NAD  Head: Normocephalic, atraumatic. Moist oral mucosal membranes  Eyes: Anicteric  Lungs:  Clear to auscultation, normal effort  Heart: Regular rate and rhythm  Abdomen:  Soft, nontender  Extremities:  1+ peripheral edema.  Neurologic: Nonfocal, moving all four extremities  Skin: No  lesions  Access: None    Basic Metabolic Panel: Recent Labs  Lab 06/23/21 1110 06/24/21 0526 06/25/21 0506 06/26/21 0445  NA 138 141 137 138  K 3.5 4.0 4.7 3.7  CL 105 107 106 107  CO2 25 29 25 26   GLUCOSE 387* 215* 225* 73  BUN 28* 27* 32* 37*  CREATININE 1.27* 1.39* 1.78* 2.46*  CALCIUM 8.5* 8.8* 8.7* 8.6*  MG  --  2.0 2.0 2.0    Liver Function Tests: Recent Labs  Lab 06/23/21 1110  AST 36  ALT 21  ALKPHOS 148*  BILITOT 0.3  PROT 6.6  ALBUMIN 2.6*   No results for input(s): "LIPASE", "AMYLASE" in the last 168 hours. No results for input(s): "AMMONIA" in the last 168 hours.  CBC: Recent Labs  Lab 06/23/21 1110 06/24/21 0526 06/25/21 0506 06/26/21 0445  WBC 5.7 6.6 5.6 5.2  HGB 9.9* 9.5* 9.6* 8.8*  HCT 32.4* 30.4* 30.4* 27.9*  MCV 93.4 91.6 90.5 90.3  PLT 382 365 351 338    Cardiac Enzymes: No results for input(s): "CKTOTAL", "CKMB", "CKMBINDEX", "TROPONINI" in the last 168 hours.  BNP: Invalid input(s): "POCBNP"  CBG: Recent Labs  Lab 06/26/21 0801 06/26/21 0817 06/26/21 0837 06/26/21 0923 06/26/21 1203  GLUCAP 56* 55* 60* 82 188*    Microbiology: Results for orders placed or performed during the hospital encounter of 06/23/21  Urine Culture     Status: Abnormal   Collection Time: 06/23/21 11:10 AM   Specimen: Urine, Clean Catch  Result Value Ref Range Status   Specimen Description   Final  URINE, CLEAN CATCH Performed at [facility], [address] [place], [place] [address]    Special Requests   Final    NONE Performed at [facility], [address] [place], [place] [address]    Culture MULTIPLE SPECIES PRESENT, SUGGEST RECOLLECTION (A)  Final   Report Status 06/24/2021 FINAL  Final  Body fluid culture w Gram Stain     Status: None (Preliminary result)   Collection Time: 06/25/21 11:29 AM   Specimen: Peritoneal Washings  Result Value Ref Range Status   Specimen Description   Final     PERITONEAL Performed at [facility], [address], [place], [place] [address]    Special Requests   Final    NONE Performed at [facility], [address] [place], [place] [address]    Gram Stain NO WBC SEEN NO ORGANISMS SEEN   Final   Culture   Final    NO GROWTH < 24 HOURS Performed at [facility], [address], [place], [place] [address]    Report Status PENDING  Incomplete    Coagulation Studies: No results for input(s): "LABPROT", "INR" in the last 72 hours.  Urinalysis: No results for input(s): "COLORURINE", "LABSPEC", "PHURINE", "GLUCOSEU", "HGBUR", "BILIRUBINUR", "KETONESUR", "PROTEINUR", "UROBILINOGEN", "NITRITE", "LEUKOCYTESUR" in the last 72 hours.  Invalid input(s): "APPERANCEUR"    Imaging: US Paracentesis  Result Date: 06/25/2021 INDICATION: Patient with history of CHF admitted with fluid overload and ascites on CT. Request to IR for diagnostic and therapeutic paracentesis. EXAM: ULTRASOUND GUIDED DIAGNOSTIC AND THERAPEUTIC PARACENTESIS MEDICATIONS: 10 mL 1% lidocaine COMPLICATIONS: None immediate. PROCEDURE: Informed written consent was obtained from the patient after a discussion of the risks, benefits and alternatives to treatment. A timeout was performed prior to the initiation of the procedure. Initial ultrasound scanning demonstrates a large amount of ascites within the right lower abdominal quadrant. The right lower abdomen was prepped and draped in the usual sterile fashion. 1% lidocaine was used for local anesthesia. Following this, a 19 gauge, 7-cm, Yueh catheter was introduced. An ultrasound image was saved for documentation purposes. The paracentesis was performed. The catheter was removed and a dressing was applied. The patient tolerated the procedure well without immediate post procedural complication. FINDINGS: A total of approximately 5.3 L of clear yellow fluid was removed. Samples were sent to the laboratory as requested  by the clinical team. IMPRESSION: Successful ultrasound-guided paracentesis yielding 5.3 liters of peritoneal fluid. Read by Lynnette Caffey, PA-C Electronically Signed   By: Roanna Banning M.D.   On: 06/25/2021 13:22     Medications:     aspirin  81 mg Oral Daily   carvedilol  12.5 mg Oral BID WC   heparin injection (subcutaneous)  5,000 Units Subcutaneous Q8H   insulin aspart  0-15 Units Subcutaneous TID WC   spironolactone  25 mg Oral Daily   torsemide  20 mg Oral Daily   acetaminophen, docusate sodium, polyethylene glycol, traMADol  Assessment/ Plan:  Shawn [name] is a 66 y.o.  male with past medical conditions of CVA, diabetes, CHF, hypertension, and chronic kidney disease IIib. Patient presented to ED with lower extremity swelling and scrotal. Patient is admitted for Anasarca [R60.1] Testicle swelling [N50.89] Acute on chronic congestive heart failure, unspecified heart failure type ([name]) [I50.9]   Acute Kidney Injury on chronic kidney disease stage IIIb with baseline creatinine 1.27 06/23/21.  Acute kidney injury secondary to fluid overload and IV contrast exposure on 06/23/21. Renal US  pending. CT abd/pelvis negative for obstruction. Patient received IV Furosemide 60mg . Will restart patient on oral diuretics today, Torsemide 20 mg and Spironolactone 25mg  daily.   Lab Results  Component Value Date   CREATININE 2.46 (H) 06/26/2021   CREATININE 1.78 (H) 06/25/2021   CREATININE 1.39 (H) 06/24/2021    Intake/Output Summary (Last 24 hours) at 06/26/2021 1448 Last data filed at 06/26/2021 1355 Gross per 24 hour  Intake 1293.76 ml  Output 176 ml  Net 1117.76 ml   2. Hypertension with chronic kidney disease. Home regimen includes Carvedilol and entresto. Entresto currently held. Restarting oral diuretics today.   3. 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 Home regimen includes NovoLog and glargine-yfgn.  Most recent hemoglobin A1c is  11.9 on 06/24/21.  Hypoglycemic this morning to 56.  Treated by staff, corrected to 82.    LOS: 2 Gage Treiber 6/23/20232:48 PM

## 2021-06-27 DIAGNOSIS — I5043 Acute on chronic combined systolic (congestive) and diastolic (congestive) heart failure: Secondary | ICD-10-CM

## 2021-06-27 DIAGNOSIS — R601 Generalized edema: Secondary | ICD-10-CM | POA: Diagnosis not present

## 2021-06-27 DIAGNOSIS — N1831 Chronic kidney disease, stage 3a: Secondary | ICD-10-CM

## 2021-06-27 LAB — CBC
HCT: 28.8 % — ABNORMAL LOW (ref 39.0–52.0)
Hemoglobin: 9.1 g/dL — ABNORMAL LOW (ref 13.0–17.0)
MCH: 28.2 pg (ref 26.0–34.0)
MCHC: 31.6 g/dL (ref 30.0–36.0)
MCV: 89.2 fL (ref 80.0–100.0)
Platelets: 336 10*3/uL (ref 150–400)
RBC: 3.23 MIL/uL — ABNORMAL LOW (ref 4.22–5.81)
RDW: 14.9 % (ref 11.5–15.5)
WBC: 4.9 10*3/uL (ref 4.0–10.5)
nRBC: 0 % (ref 0.0–0.2)

## 2021-06-27 LAB — BASIC METABOLIC PANEL
Anion gap: 5 (ref 5–15)
BUN: 43 mg/dL — ABNORMAL HIGH (ref 8–23)
CO2: 25 mmol/L (ref 22–32)
Calcium: 8.5 mg/dL — ABNORMAL LOW (ref 8.9–10.3)
Chloride: 106 mmol/L (ref 98–111)
Creatinine, Ser: 3.3 mg/dL — ABNORMAL HIGH (ref 0.61–1.24)
GFR, Estimated: 20 mL/min — ABNORMAL LOW (ref >60)
Glucose, Bld: 94 mg/dL (ref 70–99)
Potassium: 4 mmol/L (ref 3.5–5.1)
Sodium: 136 mmol/L (ref 135–145)

## 2021-06-27 LAB — FOLATE: Folate: 25 ng/mL (ref >5.9)

## 2021-06-27 LAB — IRON AND TIBC
Iron: 24 ug/dL — ABNORMAL LOW (ref 45–182)
Saturation Ratios: 10 % — ABNORMAL LOW (ref 17.9–39.5)
TIBC: 234 ug/dL — ABNORMAL LOW (ref 250–450)
UIBC: 210 ug/dL

## 2021-06-27 LAB — VITAMIN B12: Vitamin B-12: 475 pg/mL (ref 180–914)

## 2021-06-27 LAB — GLUCOSE, CAPILLARY
Glucose-Capillary: 94 mg/dL (ref 70–99)
Glucose-Capillary: 132 mg/dL — ABNORMAL HIGH (ref 70–99)
Glucose-Capillary: 206 mg/dL — ABNORMAL HIGH (ref 70–99)
Glucose-Capillary: 173 mg/dL — ABNORMAL HIGH (ref 70–99)

## 2021-06-27 LAB — MAGNESIUM: Magnesium: 1.9 mg/dL (ref 1.7–2.4)

## 2021-06-27 LAB — FERRITIN: Ferritin: 59 ng/mL (ref 24–336)

## 2021-06-27 NOTE — Progress Notes (Signed)
[facility]  ROUNDING NOTE   Subjective:   Shawn [name] is a 66 y.o. AA male with past medical conditions of CVA, diabetes, CHF, hypertension, and chronic kidney disease Iib. Patient presented to ED with lower extremity swelling and scrotal. Patient is admitted for Anasarca [R60.1] Testicle swelling [N50.89] Acute on chronic congestive heart failure, unspecified heart failure type [facility]) [I50.9]  Patient is known to our clinic and is followed by Dr Thedore Mins. He states he was taken off his diuretics and has not taken any. He began noticing the swelling 3-4 days ago. He denies shortness of breath. Denies nausea, vomiting and diarrhea. Denies chest pain.   Upon evaluation in the ER patient was found to have glucose 387, BUN 28, and creatinine 1.27. Creatinine has risen to 2.46. CT CT abdomen pelvis shows small volume fluid overload with ascites, diffuse anasarca.  Chest x-ray shows congestive heart failure with small bilateral pleural effusions.  Paracentesis performed with 5.3 L fluid removed   Patient was seen today on second floor Patient main concern in today's visit was his scrotal edema   Objective:  Vital signs in last 24 hours:  Temp:  [98.5 F (36.9 C)-98.9 F (37.2 C)] 98.5 F (36.9 C) (06/24 0747) Pulse Rate:  [75-81] 75 (06/24 0747) Resp:  [18-22] 20 (06/24 0747) BP: (127-144)/(75-111) 144/97 (06/24 0747) SpO2:  [91 %-94 %] 91 % (06/24 0747) Weight:  [73.9 kg-74.3 kg] 73.9 kg (06/24 0601)  Weight change:  Filed Weights   06/23/21 1108 06/26/21 1100 06/27/21 0601  Weight: 59 kg 74.3 kg 73.9 kg    Intake/Output: I/O last 3 completed shifts: In: 1542 [P.O.:1542] Out: 376 [Urine:375; Stool:1]   Intake/Output this shift:  No intake/output data recorded.  Physical Exam: General: NAD  Head: Normocephalic, atraumatic. Moist oral mucosal membranes  Eyes: Anicteric  Lungs:  Clear to auscultation, normal effort  Heart: Regular rate and rhythm  Abdomen:   Soft, nontender  Extremities:  1+ peripheral edema.  Neurologic: Nonfocal, moving all four extremities  Skin: No lesions  Access: None  GU-scrotal edema  Basic Metabolic Panel: Recent Labs  Lab 06/23/21 1110 06/24/21 0526 06/25/21 0506 06/26/21 0445 06/27/21 0701  NA 138 141 137 138 136  K 3.5 4.0 4.7 3.7 4.0  CL 105 107 106 107 106  CO2 25 29 25 26 25   GLUCOSE 387* 215* 225* 73 94  BUN 28* 27* 32* 37* 43*  CREATININE 1.27* 1.39* 1.78* 2.46* 3.30*  CALCIUM 8.5* 8.8* 8.7* 8.6* 8.5*  MG  --  2.0 2.0 2.0 1.9    Liver Function Tests: Recent Labs  Lab 06/23/21 1110  AST 36  ALT 21  ALKPHOS 148*  BILITOT 0.3  PROT 6.6  ALBUMIN 2.6*   No results for input(s): "LIPASE", "AMYLASE" in the last 168 hours. No results for input(s): "AMMONIA" in the last 168 hours.  CBC: Recent Labs  Lab 06/23/21 1110 06/24/21 0526 06/25/21 0506 06/26/21 0445 06/27/21 0701  WBC 5.7 6.6 5.6 5.2 4.9  HGB 9.9* 9.5* 9.6* 8.8* 9.1*  HCT 32.4* 30.4* 30.4* 27.9* 28.8*  MCV 93.4 91.6 90.5 90.3 89.2  PLT 382 365 351 338 336    Cardiac Enzymes: No results for input(s): "CKTOTAL", "CKMB", "CKMBINDEX", "TROPONINI" in the last 168 hours.  BNP: Invalid input(s): "POCBNP"  CBG: Recent Labs  Lab 06/26/21 1203 06/26/21 1557 06/26/21 1625 06/26/21 2034 06/27/21 0749  GLUCAP 188* 249* 220* 197* 94    Microbiology: Results for orders placed or performed  during the hospital encounter of 06/23/21  Urine Culture     Status: Abnormal   Collection Time: 06/23/21 11:10 AM   Specimen: Urine, Clean Catch  Result Value Ref Range Status   Specimen Description   Final    URINE, CLEAN CATCH Performed at [facility], [address], [place], [place] [address]    Special Requests   Final    NONE Performed at [facility], [address] [place], [place] [address]    Culture MULTIPLE SPECIES PRESENT, SUGGEST RECOLLECTION (A)  Final   Report Status 06/24/2021 FINAL  Final   Body fluid culture w Gram Stain     Status: None (Preliminary result)   Collection Time: 06/25/21 11:29 AM   Specimen: Peritoneal Washings  Result Value Ref Range Status   Specimen Description   Final    PERITONEAL Performed at [facility], [address] [place], [place] [address]    Special Requests   Final    NONE Performed at [facility], [address] [place], [place] [address]    Gram Stain NO WBC SEEN NO ORGANISMS SEEN   Final   Culture   Final    NO GROWTH 2 DAYS Performed at [facility], [address], [place], [place] [address]    Report Status PENDING  Incomplete    Coagulation Studies: No results for input(s): "LABPROT", "INR" in the last 72 hours.  Urinalysis: No results for input(s): "COLORURINE", "LABSPEC", "PHURINE", "GLUCOSEU", "HGBUR", "BILIRUBINUR", "KETONESUR", "PROTEINUR", "UROBILINOGEN", "NITRITE", "LEUKOCYTESUR" in the last 72 hours.  Invalid input(s): "APPERANCEUR"    Imaging: ECHOCARDIOGRAM COMPLETE  Result Date: 06/26/2021    ECHOCARDIOGRAM REPORT   Patient Name:   Shawn [name] Date of Exam: 06/26/2021 Medical Rec #:  [id number]           Height:       67.0 in Accession #:    [id number]          Weight:       130.1 lb Date of Birth:  09/11/1955            BSA:          1.684 m Patient Age:    66 years            BP:           143/85 mmHg Patient Gender: M                   HR:           81 bpm. Exam Location:  [facility] Procedure: 2D Echo, Color Doppler, Cardiac Doppler and Strain Analysis Indications:     Anasarca  History:         Patient has prior history of Echocardiogram examinations, most                  recent 01/22/2021. CHF and Non-ischemic cardiomyopathy, CKD,                  stage II; Risk Factors:Hypertension and Diabetes.  Sonographer:     Humphrey Rolls Referring Phys:  [id number] Inetta Fermo LAI Diagnosing Phys: Julien Nordmann MD  Sonographer Comments: Global longitudinal strain was attempted. IMPRESSIONS  1. Left  ventricular ejection fraction, by estimation, is 25 to 30%. The left ventricle has severely decreased function. The left ventricle demonstrates global hypokinesis. The left ventricular internal cavity size was mildly dilated. Left ventricular diastolic parameters are consistent with Grade II  diastolic dysfunction (pseudonormalization). The average left ventricular global longitudinal strain is 9.0 %. The global longitudinal strain is abnormal.  2. Right ventricular systolic function is normal. The right ventricular size is normal. Tricuspid regurgitation signal is inadequate for assessing PA pressure.  3. Left atrial size was mildly dilated.  4. A small pericardial effusion is present. The pericardial effusion is circumferential. There is no evidence of cardiac tamponade. Moderate pleural effusion in the left lateral region.  5. The mitral valve is normal in structure. Moderate mitral valve regurgitation. No evidence of mitral stenosis.  6. The aortic valve is tricuspid. Aortic valve regurgitation is not visualized. No aortic stenosis is present.  7. The inferior vena cava is dilated in size with >50% respiratory variability, suggesting right atrial pressure of 8 mmHg. FINDINGS  Left Ventricle: Left ventricular ejection fraction, by estimation, is 25 to 30%. The left ventricle has severely decreased function. The left ventricle demonstrates global hypokinesis. The average left ventricular global longitudinal strain is 9.0 %. The global longitudinal strain is abnormal. The left ventricular internal cavity size was mildly dilated. There is no left ventricular hypertrophy. Left ventricular diastolic parameters are consistent with Grade II diastolic dysfunction (pseudonormalization). Right Ventricle: The right ventricular size is normal. No increase in right ventricular wall thickness. Right ventricular systolic function is normal. Tricuspid regurgitation signal is inadequate for assessing PA pressure. Left Atrium: Left  atrial size was mildly dilated. Right Atrium: Right atrial size was normal in size. Pericardium: A small pericardial effusion is present. The pericardial effusion is circumferential. There is no evidence of cardiac tamponade. Mitral Valve: The mitral valve is normal in structure. Moderate mitral valve regurgitation. No evidence of mitral valve stenosis. Tricuspid Valve: The tricuspid valve is normal in structure. Tricuspid valve regurgitation is mild . No evidence of tricuspid stenosis. Aortic Valve: The aortic valve is tricuspid. Aortic valve regurgitation is not visualized. No aortic stenosis is present. Aortic valve mean gradient measures 2.0 mmHg. Aortic valve peak gradient measures 4.4 mmHg. Aortic valve area, by VTI measures 2.91 cm. Pulmonic Valve: The pulmonic valve was normal in structure. Pulmonic valve regurgitation is not visualized. No evidence of pulmonic stenosis. Aorta: The aortic root is normal in size and structure. Ascending aorta measurements are within normal limits for age when indexed to body surface area. Venous: The inferior vena cava is dilated in size with greater than 50% respiratory variability, suggesting right atrial pressure of 8 mmHg. IAS/Shunts: No atrial level shunt detected by color flow Doppler. Additional Comments: There is a moderate pleural effusion in the left lateral region.  LEFT VENTRICLE PLAX 2D LVIDd:         5.17 cm      Diastology LVIDs:         4.65 cm      LV e' medial:    2.83 cm/s LV PW:         1.45 cm      LV E/e' medial:  29.5 LV IVS:        1.00 cm      LV e' lateral:   6.85 cm/s LVOT diam:     2.00 cm      LV E/e' lateral: 12.2 LV SV:         56 LV SV Index:   33           2D Longitudinal Strain LVOT Area:     3.14 cm     2D Strain GLS Avg:     9.0 %  LV Volumes (MOD) LV vol d, MOD A2C: 183.0 ml LV vol d, MOD A4C: 152.0 ml LV vol s, MOD A2C: 111.0 ml LV vol s, MOD A4C: 115.0 ml LV SV MOD A2C:     72.0 ml LV SV MOD A4C:     152.0 ml LV SV MOD BP:      52.7 ml  RIGHT VENTRICLE RV Basal diam:  3.13 cm RV S prime:     10.00 cm/s TAPSE (M-mode): 2.5 cm LEFT ATRIUM             Index        RIGHT ATRIUM           Index LA diam:        4.50 cm 2.67 cm/m   RA Area:     15.50 cm LA Vol (A2C):   67.7 ml 40.19 ml/m  RA Volume:   40.80 ml  24.22 ml/m LA Vol (A4C):   73.0 ml 43.34 ml/m LA Biplane Vol: 70.3 ml 41.74 ml/m  AORTIC VALVE                    PULMONIC VALVE AV Area (Vmax):    3.20 cm     PV Vmax:       0.67 m/s AV Area (Vmean):   2.87 cm     PV Peak grad:  1.8 mmHg AV Area (VTI):     2.91 cm AV Vmax:           105.00 cm/s AV Vmean:          74.000 cm/s AV VTI:            0.192 m AV Peak Grad:      4.4 mmHg AV Mean Grad:      2.0 mmHg LVOT Vmax:         107.00 cm/s LVOT Vmean:        67.600 cm/s LVOT VTI:          0.178 m LVOT/AV VTI ratio: 0.93  AORTA Ao Root diam: 3.80 cm MITRAL VALVE MV Area (PHT): 7.82 cm       SHUNTS MV Decel Time: 97 msec        Systemic VTI:  0.18 m MR Peak grad:    129.5 mmHg   Systemic Diam: 2.00 cm MR Mean grad:    79.0 mmHg MR Vmax:         569.00 cm/s MR Vmean:        419.0 cm/s MR PISA:         1.57 cm MR PISA Eff ROA: 6 mm MR PISA Radius:  0.50 cm MV E velocity: 83.60 cm/s MV A velocity: 59.10 cm/s MV E/A ratio:  1.41 Julien Nordmann MD Electronically signed by Julien Nordmann MD Signature Date/Time: 06/26/2021/5:10:11 PM    Final    US RENAL  Result Date: 06/26/2021 CLINICAL DATA:  Acute kidney injury. EXAM: RENAL / URINARY TRACT ULTRASOUND COMPLETE COMPARISON:  Renal ultrasound 01/19/2019; ultrasound for ultrasound-guided paracentesis 06/25/2021; CT abdomen and pelvis 06/23/2021 FINDINGS: Right Kidney: Renal measurements: 10.7 x 6.1 x 6.3 cm = volume: 214 mL. Increased cortical echogenicity is again seen. No mass or hydronephrosis visualized. Left Kidney: Renal measurements: 12.4 x 6.5 x 6.0 cm = volume: 253 mL. Increased cortical echogenicity is again seen. No mass or hydronephrosis visualized. Bladder: Possible mild irregularity  of the bladder wall, however this is diffuse. No definite focal mass. On recent 06/23/2021 CT, no  focal urinary bladder wall thickening is seen. Other: Moderate ascites.  Left pleural fluid is noted. IMPRESSION: 1. There is again bilateral increased cortical echogenicity as can be seen with chronic medical renal disease. No hydronephrosis. 2. Moderate ascites. 3. Left pleural effusion. Bilateral pleural effusions were seen on recent CT. Electronically Signed   By: Neita Garnet M.D.   On: 06/26/2021 16:27   US Paracentesis  Result Date: 06/25/2021 INDICATION: Patient with history of CHF admitted with fluid overload and ascites on CT. Request to IR for diagnostic and therapeutic paracentesis. EXAM: ULTRASOUND GUIDED DIAGNOSTIC AND THERAPEUTIC PARACENTESIS MEDICATIONS: 10 mL 1% lidocaine COMPLICATIONS: None immediate. PROCEDURE: Informed written consent was obtained from the patient after a discussion of the risks, benefits and alternatives to treatment. A timeout was performed prior to the initiation of the procedure. Initial ultrasound scanning demonstrates a large amount of ascites within the right lower abdominal quadrant. The right lower abdomen was prepped and draped in the usual sterile fashion. 1% lidocaine was used for local anesthesia. Following this, a 19 gauge, 7-cm, Yueh catheter was introduced. An ultrasound image was saved for documentation purposes. The paracentesis was performed. The catheter was removed and a dressing was applied. The patient tolerated the procedure well without immediate post procedural complication. FINDINGS: A total of approximately 5.3 L of clear yellow fluid was removed. Samples were sent to the laboratory as requested by the clinical team. IMPRESSION: Successful ultrasound-guided paracentesis yielding 5.3 liters of peritoneal fluid. Read by Lynnette Caffey, PA-C Electronically Signed   By: Roanna Banning M.D.   On: 06/25/2021 13:22     Medications:     aspirin  81 mg  Oral Daily   carvedilol  12.5 mg Oral BID WC   heparin injection (subcutaneous)  5,000 Units Subcutaneous Q8H   insulin aspart  0-15 Units Subcutaneous TID WC   insulin glargine-yfgn  10 Units Subcutaneous QHS   spironolactone  25 mg Oral Daily   torsemide  20 mg Oral Daily   acetaminophen, docusate sodium, polyethylene glycol, traMADol  Assessment/ Plan:  Shawn [name] is a 66 y.o.  male with past medical conditions of CVA, diabetes, CHF, hypertension, and chronic kidney disease IIib. Patient presented to ED with lower extremity swelling and scrotal. Patient is admitted for Anasarca [R60.1] Testicle swelling [N50.89] Acute on chronic congestive heart failure, unspecified heart failure type ([name]) [I50.9]   Acute Kidney Injury on chronic kidney disease stage IIIb with baseline creatinine 1.27 06/23/21.   Acute kidney injury Patient AKI is worsening Patient creatinine at the time of admission was 1.3 and now patient has creatinine of 3.3   Patient has AKI most likely secondary to multiple issues  Patient has AKI secondary to ATN  -IV contrast exposure  -Cardiorenal syndrome as patient EF is less than 25--30%  Possible contribution from obstructive issues as well as patient postvoid residual shows 140 mL of urine  We will ask for Foley placement We will follow I/O's strictly Patient edema is better than before We will continue patient on current diuretics for now    Lab Results  Component Value Date   CREATININE 3.30 (H) 06/27/2021   CREATININE 2.46 (H) 06/26/2021   CREATININE 1.78 (H) 06/25/2021    Intake/Output Summary (Last 24 hours) at 06/27/2021 0857 Last data filed at 06/27/2021 0600 Gross per 24 hour  Intake 960 ml  Output 275 ml  Net 685 ml   2. Hypertension with chronic kidney disease.  Patient blood pressure is near  to goal   3. 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 Home regimen includes NovoLog and  glargine-yfgn.  Most recent hemoglobin A1c is 11.9 on 06/24/21.  Hypoglycemic this morning to 56.  Treated by staff, corrected to 82.   4.Anemia We will ask for anemia work-up   5.A cute urinary retention Patient post void bladder volume was 140 mL We will ask for catheter placement    LOS: 3 Dupree Givler s Demarian Epps 6/24/20238:57 AM

## 2021-06-28 ENCOUNTER — Inpatient Hospital Stay: Payer: No Typology Code available for payment source

## 2021-06-28 DIAGNOSIS — I5023 Acute on chronic systolic (congestive) heart failure: Secondary | ICD-10-CM | POA: Diagnosis not present

## 2021-06-28 DIAGNOSIS — R601 Generalized edema: Secondary | ICD-10-CM | POA: Diagnosis not present

## 2021-06-28 LAB — BASIC METABOLIC PANEL
Anion gap: 5 (ref 5–15)
BUN: 49 mg/dL — ABNORMAL HIGH (ref 8–23)
CO2: 22 mmol/L (ref 22–32)
Calcium: 8.6 mg/dL — ABNORMAL LOW (ref 8.9–10.3)
Chloride: 105 mmol/L (ref 98–111)
Creatinine, Ser: 4.12 mg/dL — ABNORMAL HIGH (ref 0.61–1.24)
GFR, Estimated: 15 mL/min — ABNORMAL LOW (ref >60)
Glucose, Bld: 187 mg/dL — ABNORMAL HIGH (ref 70–99)
Potassium: 4.8 mmol/L (ref 3.5–5.1)
Sodium: 132 mmol/L — ABNORMAL LOW (ref 135–145)

## 2021-06-28 LAB — GLUCOSE, CAPILLARY
Glucose-Capillary: 146 mg/dL — ABNORMAL HIGH (ref 70–99)
Glucose-Capillary: 154 mg/dL — ABNORMAL HIGH (ref 70–99)
Glucose-Capillary: 105 mg/dL — ABNORMAL HIGH (ref 70–99)
Glucose-Capillary: 113 mg/dL — ABNORMAL HIGH (ref 70–99)

## 2021-06-28 LAB — CBC
HCT: 32 % — ABNORMAL LOW (ref 39.0–52.0)
Hemoglobin: 10.1 g/dL — ABNORMAL LOW (ref 13.0–17.0)
MCH: 28.9 pg (ref 26.0–34.0)
MCHC: 31.6 g/dL (ref 30.0–36.0)
MCV: 91.7 fL (ref 80.0–100.0)
Platelets: 343 10*3/uL (ref 150–400)
RBC: 3.49 MIL/uL — ABNORMAL LOW (ref 4.22–5.81)
RDW: 15 % (ref 11.5–15.5)
WBC: 5.8 10*3/uL (ref 4.0–10.5)
nRBC: 0 % (ref 0.0–0.2)

## 2021-06-28 LAB — BODY FLUID CULTURE W GRAM STAIN
Culture: NO GROWTH
Gram Stain: NONE SEEN

## 2021-06-28 LAB — MAGNESIUM: Magnesium: 2 mg/dL (ref 1.7–2.4)

## 2021-06-28 MED ORDER — FUROSEMIDE 10 MG/ML IJ SOLN
8.0000 mg/h | INTRAMUSCULAR | Status: DC
  Administered 2021-06-28 – 2021-06-30 (×2): 4 mg/h via INTRAVENOUS
  Administered 2021-07-01 – 2021-07-06 (×6): 8 mg/h via INTRAVENOUS
  Filled 2021-06-28 (×8): qty 20

## 2021-06-28 MED ORDER — CHLORHEXIDINE GLUCONATE CLOTH 2 % EX PADS
6.0000 | MEDICATED_PAD | Freq: Every day | CUTANEOUS | Status: DC
Start: 2021-06-28 — End: 2021-07-07
  Administered 2021-06-28 – 2021-07-07 (×11): 6 via TOPICAL

## 2021-06-28 MED ORDER — FUROSEMIDE 10 MG/ML IJ SOLN
40.0000 mg | Freq: Once | INTRAMUSCULAR | Status: AC
  Administered 2021-06-28: 40 mg via INTRAVENOUS
  Filled 2021-06-28: qty 4

## 2021-06-28 MED ORDER — SODIUM CHLORIDE 0.9 % IV SOLN
510.0000 mg | Freq: Once | INTRAVENOUS | Status: AC
  Administered 2021-06-28: 510 mg via INTRAVENOUS
  Filled 2021-06-28: qty 17

## 2021-06-28 NOTE — Progress Notes (Signed)
[facility]  ROUNDING NOTE   Subjective:   Shawn [name] is a 66 y.o. AA male with past medical conditions of CVA, diabetes, CHF, hypertension, and chronic kidney disease Iib. Patient presented to ED with lower extremity swelling and scrotal. Patient is admitted for Anasarca [R60.1] Testicle swelling [N50.89] Acute on chronic congestive heart failure, unspecified heart failure type [facility]) [I50.9]  Patient is known to our clinic and is followed by Dr Thedore Mins. He states he was taken off his diuretics and has not taken any. He began noticing the swelling 3-4 days ago. He denies shortness of breath. Denies nausea, vomiting and diarrhea. Denies chest pain.   Upon evaluation in the ER patient was found to have glucose 387, BUN 28, and creatinine 1.27. Creatinine has risen to 2.46. CT CT abdomen pelvis shows small volume fluid overload with ascites, diffuse anasarca.  Chest x-ray shows congestive heart failure with small bilateral pleural effusions.  Paracentesis performed with 5.3 L fluid removed   Patient was seen today on second floor Patient main concern today visit was how was his kidney doing.  I then examined the patient and discussed her lab results.  I discussed the patient that his creatinine has now increased to nearly 4.1.  Educated patient what this means in the terms of kidney function.  Educated patient about need for IV diuresis and possible need for renal placement therapy   Objective:  Vital signs in last 24 hours:  Temp:  [97.8 F (36.6 C)-98.5 F (36.9 C)] 98 F (36.7 C) (06/25 0801) Pulse Rate:  [71-80] 71 (06/25 0801) Resp:  [16-20] 19 (06/25 0801) BP: (130-144)/(88-96) 142/93 (06/25 0801) SpO2:  [87 %-99 %] 91 % (06/25 0801) Weight:  [75.5 kg] 75.5 kg (06/25 0525)  Weight change: 1.225 kg Filed Weights   06/26/21 1100 06/27/21 0601 06/28/21 0525  Weight: 74.3 kg 73.9 kg 75.5 kg    Intake/Output: I/O last 3 completed shifts: In: 400  [P.O.:400] Out: 450 [Urine:450]   Intake/Output this shift:  No intake/output data recorded.  Physical Exam: General: NAD  Head: Normocephalic, atraumatic. Moist oral mucosal membranes  Eyes: Anicteric  Lungs:  Clear to auscultation, normal effort  Heart: Regular rate and rhythm  Abdomen:  Soft, nontender  Extremities:  1+ peripheral edema.  Neurologic: Nonfocal, moving all four extremities  Skin: No lesions  Access: None  GU-scrotal edema  Basic Metabolic Panel: Recent Labs  Lab 06/24/21 0526 06/25/21 0506 06/26/21 0445 06/27/21 0701 06/28/21 0425  NA 141 137 138 136 132*  K 4.0 4.7 3.7 4.0 4.8  CL 107 106 107 106 105  CO2 29 25 26 25 22   GLUCOSE 215* 225* 73 94 187*  BUN 27* 32* 37* 43* 49*  CREATININE 1.39* 1.78* 2.46* 3.30* 4.12*  CALCIUM 8.8* 8.7* 8.6* 8.5* 8.6*  MG 2.0 2.0 2.0 1.9 2.0    Liver Function Tests: Recent Labs  Lab 06/23/21 1110  AST 36  ALT 21  ALKPHOS 148*  BILITOT 0.3  PROT 6.6  ALBUMIN 2.6*   No results for input(s): "LIPASE", "AMYLASE" in the last 168 hours. No results for input(s): "AMMONIA" in the last 168 hours.  CBC: Recent Labs  Lab 06/24/21 0526 06/25/21 0506 06/26/21 0445 06/27/21 0701 06/28/21 0425  WBC 6.6 5.6 5.2 4.9 5.8  HGB 9.5* 9.6* 8.8* 9.1* 10.1*  HCT 30.4* 30.4* 27.9* 28.8* 32.0*  MCV 91.6 90.5 90.3 89.2 91.7  PLT 365 351 338 336 343    Cardiac Enzymes: No  results for input(s): "CKTOTAL", "CKMB", "CKMBINDEX", "TROPONINI" in the last 168 hours.  BNP: Invalid input(s): "POCBNP"  CBG: Recent Labs  Lab 06/27/21 0749 06/27/21 1227 06/27/21 1611 06/27/21 2154 06/28/21 0803  GLUCAP 94 132* 206* 173* 146*    Microbiology: Results for orders placed or performed during the hospital encounter of 06/23/21  Urine Culture     Status: Abnormal   Collection Time: 06/23/21 11:10 AM   Specimen: Urine, Clean Catch  Result Value Ref Range Status   Specimen Description   Final    URINE, CLEAN CATCH Performed  at [facility], [address], [place], [place] [address]    Special Requests   Final    NONE Performed at [facility], [address] [place], [place] [address]    Culture MULTIPLE SPECIES PRESENT, SUGGEST RECOLLECTION (A)  Final   Report Status 06/24/2021 FINAL  Final  Body fluid culture w Gram Stain     Status: None   Collection Time: 06/25/21 11:29 AM   Specimen: Peritoneal Washings  Result Value Ref Range Status   Specimen Description   Final    PERITONEAL Performed at [facility], [address], [place], [place] [address]    Special Requests   Final    NONE Performed at [facility], [address] [place], [place] [address]    Gram Stain NO WBC SEEN NO ORGANISMS SEEN   Final   Culture   Final    NO GROWTH 3 DAYS Performed at [facility], [address] [place], [place] [address]    Report Status 06/28/2021 FINAL  Final    Coagulation Studies: No results for input(s): "LABPROT", "INR" in the last 72 hours.  Urinalysis: No results for input(s): "COLORURINE", "LABSPEC", "PHURINE", "GLUCOSEU", "HGBUR", "BILIRUBINUR", "KETONESUR", "PROTEINUR", "UROBILINOGEN", "NITRITE", "LEUKOCYTESUR" in the last 72 hours.  Invalid input(s): "APPERANCEUR"    Imaging: ECHOCARDIOGRAM COMPLETE  Result Date: 06/26/2021    ECHOCARDIOGRAM REPORT   Patient Name:   Shawn [name] Date of Exam: 06/26/2021 Medical Rec #:  [id number]           Height:       67.0 in Accession #:    [id number]          Weight:       130.1 lb Date of Birth:  08-20-1955            BSA:          1.684 m Patient Age:    66 years            BP:           143/85 mmHg Patient Gender: M                   HR:           81 bpm. Exam Location:  [facility] Procedure: 2D Echo, Color Doppler, Cardiac Doppler and Strain Analysis Indications:     Anasarca  History:         Patient has prior history of Echocardiogram examinations, most                  recent 01/22/2021. CHF and  Non-ischemic cardiomyopathy, CKD,                  stage II; Risk Factors:Hypertension and Diabetes.  Sonographer:     Humphrey Rolls Referring Phys:  [id number] Inetta Fermo LAI Diagnosing Phys: Julien Nordmann MD  Sonographer Comments:  Global longitudinal strain was attempted. IMPRESSIONS  1. Left ventricular ejection fraction, by estimation, is 25 to 30%. The left ventricle has severely decreased function. The left ventricle demonstrates global hypokinesis. The left ventricular internal cavity size was mildly dilated. Left ventricular diastolic parameters are consistent with Grade II diastolic dysfunction (pseudonormalization). The average left ventricular global longitudinal strain is 9.0 %. The global longitudinal strain is abnormal.  2. Right ventricular systolic function is normal. The right ventricular size is normal. Tricuspid regurgitation signal is inadequate for assessing PA pressure.  3. Left atrial size was mildly dilated.  4. A small pericardial effusion is present. The pericardial effusion is circumferential. There is no evidence of cardiac tamponade. Moderate pleural effusion in the left lateral region.  5. The mitral valve is normal in structure. Moderate mitral valve regurgitation. No evidence of mitral stenosis.  6. The aortic valve is tricuspid. Aortic valve regurgitation is not visualized. No aortic stenosis is present.  7. The inferior vena cava is dilated in size with >50% respiratory variability, suggesting right atrial pressure of 8 mmHg. FINDINGS  Left Ventricle: Left ventricular ejection fraction, by estimation, is 25 to 30%. The left ventricle has severely decreased function. The left ventricle demonstrates global hypokinesis. The average left ventricular global longitudinal strain is 9.0 %. The global longitudinal strain is abnormal. The left ventricular internal cavity size was mildly dilated. There is no left ventricular hypertrophy. Left ventricular diastolic parameters are consistent with Grade II  diastolic dysfunction (pseudonormalization). Right Ventricle: The right ventricular size is normal. No increase in right ventricular wall thickness. Right ventricular systolic function is normal. Tricuspid regurgitation signal is inadequate for assessing PA pressure. Left Atrium: Left atrial size was mildly dilated. Right Atrium: Right atrial size was normal in size. Pericardium: A small pericardial effusion is present. The pericardial effusion is circumferential. There is no evidence of cardiac tamponade. Mitral Valve: The mitral valve is normal in structure. Moderate mitral valve regurgitation. No evidence of mitral valve stenosis. Tricuspid Valve: The tricuspid valve is normal in structure. Tricuspid valve regurgitation is mild . No evidence of tricuspid stenosis. Aortic Valve: The aortic valve is tricuspid. Aortic valve regurgitation is not visualized. No aortic stenosis is present. Aortic valve mean gradient measures 2.0 mmHg. Aortic valve peak gradient measures 4.4 mmHg. Aortic valve area, by VTI measures 2.91 cm. Pulmonic Valve: The pulmonic valve was normal in structure. Pulmonic valve regurgitation is not visualized. No evidence of pulmonic stenosis. Aorta: The aortic root is normal in size and structure. Ascending aorta measurements are within normal limits for age when indexed to body surface area. Venous: The inferior vena cava is dilated in size with greater than 50% respiratory variability, suggesting right atrial pressure of 8 mmHg. IAS/Shunts: No atrial level shunt detected by color flow Doppler. Additional Comments: There is a moderate pleural effusion in the left lateral region.  LEFT VENTRICLE PLAX 2D LVIDd:         5.17 cm      Diastology LVIDs:         4.65 cm      LV e' medial:    2.83 cm/s LV PW:         1.45 cm      LV E/e' medial:  29.5 LV IVS:        1.00 cm      LV e' lateral:   6.85 cm/s LVOT diam:     2.00 cm      LV E/e' lateral: 12.2 LV SV:  56 LV SV Index:   33           2D  Longitudinal Strain LVOT Area:     3.14 cm     2D Strain GLS Avg:     9.0 %  LV Volumes (MOD) LV vol d, MOD A2C: 183.0 ml LV vol d, MOD A4C: 152.0 ml LV vol s, MOD A2C: 111.0 ml LV vol s, MOD A4C: 115.0 ml LV SV MOD A2C:     72.0 ml LV SV MOD A4C:     152.0 ml LV SV MOD BP:      52.7 ml RIGHT VENTRICLE RV Basal diam:  3.13 cm RV S prime:     10.00 cm/s TAPSE (M-mode): 2.5 cm LEFT ATRIUM             Index        RIGHT ATRIUM           Index LA diam:        4.50 cm 2.67 cm/m   RA Area:     15.50 cm LA Vol (A2C):   67.7 ml 40.19 ml/m  RA Volume:   40.80 ml  24.22 ml/m LA Vol (A4C):   73.0 ml 43.34 ml/m LA Biplane Vol: 70.3 ml 41.74 ml/m  AORTIC VALVE                    PULMONIC VALVE AV Area (Vmax):    3.20 cm     PV Vmax:       0.67 m/s AV Area (Vmean):   2.87 cm     PV Peak grad:  1.8 mmHg AV Area (VTI):     2.91 cm AV Vmax:           105.00 cm/s AV Vmean:          74.000 cm/s AV VTI:            0.192 m AV Peak Grad:      4.4 mmHg AV Mean Grad:      2.0 mmHg LVOT Vmax:         107.00 cm/s LVOT Vmean:        67.600 cm/s LVOT VTI:          0.178 m LVOT/AV VTI ratio: 0.93  AORTA Ao Root diam: 3.80 cm MITRAL VALVE MV Area (PHT): 7.82 cm       SHUNTS MV Decel Time: 97 msec        Systemic VTI:  0.18 m MR Peak grad:    129.5 mmHg   Systemic Diam: 2.00 cm MR Mean grad:    79.0 mmHg MR Vmax:         569.00 cm/s MR Vmean:        419.0 cm/s MR PISA:         1.57 cm MR PISA Eff ROA: 6 mm MR PISA Radius:  0.50 cm MV E velocity: 83.60 cm/s MV A velocity: 59.10 cm/s MV E/A ratio:  1.41 Julien Nordmann MD Electronically signed by Julien Nordmann MD Signature Date/Time: 06/26/2021/5:10:11 PM    Final    US RENAL  Result Date: 06/26/2021 CLINICAL DATA:  Acute kidney injury. EXAM: RENAL / URINARY TRACT ULTRASOUND COMPLETE COMPARISON:  Renal ultrasound 01/19/2019; ultrasound for ultrasound-guided paracentesis 06/25/2021; CT abdomen and pelvis 06/23/2021 FINDINGS: Right Kidney: Renal measurements: 10.7 x 6.1 x 6.3 cm =  volume: 214 mL. Increased cortical echogenicity is again seen. No mass or hydronephrosis visualized. Left Kidney: Renal  measurements: 12.4 x 6.5 x 6.0 cm = volume: 253 mL. Increased cortical echogenicity is again seen. No mass or hydronephrosis visualized. Bladder: Possible mild irregularity of the bladder wall, however this is diffuse. No definite focal mass. On recent 06/23/2021 CT, no focal urinary bladder wall thickening is seen. Other: Moderate ascites.  Left pleural fluid is noted. IMPRESSION: 1. There is again bilateral increased cortical echogenicity as can be seen with chronic medical renal disease. No hydronephrosis. 2. Moderate ascites. 3. Left pleural effusion. Bilateral pleural effusions were seen on recent CT. Electronically Signed   By: Neita Garnet M.D.   On: 06/26/2021 16:27     Medications:     aspirin  81 mg Oral Daily   carvedilol  12.5 mg Oral BID WC   heparin injection (subcutaneous)  5,000 Units Subcutaneous Q8H   insulin aspart  0-15 Units Subcutaneous TID WC   insulin glargine-yfgn  10 Units Subcutaneous QHS   torsemide  20 mg Oral Daily   acetaminophen, docusate sodium, polyethylene glycol, traMADol  Assessment/ Plan:  Mr. DENISON FARWELL is a 66 y.o.  male with past medical conditions of CVA, diabetes, CHF, hypertension, and chronic kidney disease IIib. Patient presented to ED with lower extremity swelling and scrotal. Patient is admitted for Anasarca [R60.1] Testicle swelling [N50.89] Acute on chronic congestive heart failure, unspecified heart failure type ([name]) [I50.9]   Acute Kidney Injury on chronic kidney disease stage IIIb with baseline creatinine 1.27 06/23/21.   Acute kidney injury Patient AKI is worsening Patient creatinine at the time of admission was 1.3 and now patient has creatinine of 4.1   Patient has AKI most likely secondary to multiple issues  Patient has AKI secondary to ATN  -IV contrast exposure  -Cardiorenal syndrome as patient  EF is less than 25--30%  Possible contribution from obstructive issues as well as patient postvoid residual shows 140 mL of urine  Yesterday and asked for Foley placement but we were unable to place Foley Then we had external catheter  placed Patient creatinine continues to worsen  Patient AKI is worsening I asked for chest x-ray Patient chest x-ray shows patient is in fluid overload Chest x-ray done on June 28, 2021  IMPRESSION: 1. Cardiomegaly, small effusions, and diffuse interstitial opacities. The interstitial opacities are favored represent pulmonary edema. 2. More focal opacity in the right base could represent atelectasis or developing infiltrate. Recommend attention on follow-up.   I started patient on Lasix drip I also requested for indwelling catheter which was later on placed   I discussed with the patient about worsening of his kidney disease and possible need for renal placement therapy   Lab Results  Component Value Date   CREATININE 4.12 (H) 06/28/2021   CREATININE 3.30 (H) 06/27/2021   CREATININE 2.46 (H) 06/26/2021    Intake/Output Summary (Last 24 hours) at 06/28/2021 0932 Last data filed at 06/27/2021 1925 Gross per 24 hour  Intake 400 ml  Output 250 ml  Net 150 ml   2. Hypertension with chronic kidney disease.  Patient blood pressure is near to goal   3. 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 Home regimen includes NovoLog and glargine-yfgn.  Most recent hemoglobin A1c is 11.9 on 06/24/21.  Hypoglycemic this morning to 56.  Treated by staff, corrected to 82.   4.Anemia-Iron deficiency anemia   Latest Reference Range & Units 06/27/21 07:01  Iron 45 - 182 ug/dL 24 (L)  UIBC ug/dL 604  TIBC 540 -  450 ug/dL 086 (L)  Saturation Ratios 17.9 - 39.5 % 10 (L)  Ferritin 24 - 336 ng/mL 59  Folate >5.9 ng/mL 25.0  (L): Data is abnormally low  We will start patient on IV iron   5.A cute urinary retention Patient  post void bladder volume was 140 mL Patient now has catheter in situ  6.  Acute on chronic systolic  CHF We will start patient on IV diuretics Cardiology is following    LOS: 4 Remus Hagedorn s [facility] 6/25/20239:32 AM

## 2021-06-28 NOTE — Consult Note (Signed)
Cardiology Consultation:   Patient ID: Shawn [name] MRN: [id number]; DOB: [birth year]  Admit date: 06/23/2021 Date of Consult: 06/28/2021  PCP:  [facility]   [facility] Providers Cardiologist:  Julien Nordmann, MD   {     Patient Profile:   Shawn [name] is a 66 y.o. male with a hx of acute on chronic systolic heart failure who is being seen 06/28/2021 for the evaluation of anasarca at the request of Dr. Fran Lowes.  History of Present Illness:   Shawn [name] is followed by Dr. Mariah Milling and presented with massive volume overload and has been treated with IV diuresis. His volume overload has improved but he has developed worsening renal function, ?cardiorenal syndrome. He feels better and is still making urine. On arrival his creatinine was 1.4 and today is 4.1. Note the nephrology service involvement. He has not had chest pain. He thinks his swelling has been getting worse the past month or two. He had worsening dyspnea for just a few days prior to arrival which is much improved. He is known to have an EF of 25%.    Past Medical History:  Diagnosis Date   Chronic combined systolic (congestive) and diastolic (congestive) heart failure ([name])    a. TTE 6/15: EF < 20%, mildly dilated LV, DD, mildly dilated LA, mod dilated RA, mild MR, mild to mod TR, mod increased posterior wall thickness, elevated LA and LVEDP; b. 4/12019 Echo: EF 20-25%, diff HK. Gr1 DD, nl RV fxn.   CKD (chronic kidney disease), stage II    Diabetes mellitus with complication ([name])    a. Prior admissions w/ DKA (last 12/2017).   Essential hypertension    NICM (nonischemic cardiomyopathy) ([name])    a. 06/2013 Echo: EF<20%; b. 06/2013 Cath: no signif dzs; c. 04/2017 Echo: 20-25%, gr1 DD.; d.05/2018 Echo: 25-30%   Polysubstance abuse ([name])    a. etoh and tobacco   Stroke [facility])     Past Surgical History:  Procedure Laterality Date   NO PAST SURGERIES       Home Medications:  Prior to Admission  medications   Medication Sig Start Date End Date Taking? Authorizing Provider  aspirin 81 MG chewable tablet Chew 1 tablet (81 mg total) by mouth daily. 04/30/17  Yes Shaune Pollack, MD  atorvastatin (LIPITOR) 40 MG tablet TAKE ONE TABLET BY MOUTH AT BEDTIME FOR CHOLESTEROL 02/16/21  Yes [provider]  carvedilol (COREG) 12.5 MG tablet Take 12.5 mg by mouth 2 (two) times daily with a meal.   Yes [provider]  cholecalciferol (VITAMIN D) 25 MCG (1000 UNIT) tablet Take 2,000 Units by mouth daily. Mon   Yes [provider]  hydrOXYzine (ATARAX) 10 MG tablet Take 10 mg by mouth 4 (four) times daily - after meals and at bedtime. 02/18/21  Yes [provider]  insulin aspart (NOVOLOG FLEXPEN) 100 UNIT/ML FlexPen Inject 6 Units into the skin 3 (three) times daily with meals. 03/25/21  Yes [provider]  insulin glargine-yfgn (SEMGLEE) 100 UNIT/ML Pen Inject 20 Units into the skin daily. Patient taking differently: Inject 25 Units into the skin daily. 05/14/21  Yes Darlin Priestly, MD  Multiple Vitamin (MULTIVITAMIN WITH MINERALS) TABS tablet Take 1 tablet by mouth daily. 01/10/21  Yes Ghimire, Lyndel Safe, MD  sacubitril-valsartan (ENTRESTO) 49-51 MG Take 0.5 tablets by mouth 2 (two) times daily.   Yes [provider]  acetaminophen (TYLENOL) 325 MG tablet Take 2 tablets (650 mg total) by mouth every  6 (six) hours as needed for mild pain (or Fever >/= 101). 01/09/21   Dorcas Carrow, MD  atorvastatin (LIPITOR) 40 MG tablet Take 40 mg by mouth daily. Patient not taking: Reported on 06/23/2021 02/16/21   [provider]  feeding supplement, GLUCERNA SHAKE, (GLUCERNA SHAKE) LIQD Take 237 mLs by mouth 3 (three) times daily between meals. 01/09/21   Dorcas Carrow, MD  spironolactone (ALDACTONE) 25 MG tablet Hold until outpatient followup due to acute kidney injury. Patient not taking: Reported on 06/23/2021 05/14/21   Darlin Priestly, MD  torsemide [facility]) 20 MG tablet Hold  until outpatient followup due to acute kidney injury. Patient not taking: Reported on 06/23/2021 05/14/21   Darlin Priestly, MD    Inpatient Medications: Scheduled Meds:  aspirin  81 mg Oral Daily   carvedilol  12.5 mg Oral BID WC   heparin injection (subcutaneous)  5,000 Units Subcutaneous Q8H   insulin aspart  0-15 Units Subcutaneous TID WC   insulin glargine-yfgn  10 Units Subcutaneous QHS   torsemide  20 mg Oral Daily   Continuous Infusions:  PRN Meds: acetaminophen, docusate sodium, polyethylene glycol, traMADol  Allergies:    Allergies  Allergen Reactions   Pravastatin     Other reaction(s): Muscle pain   Simvastatin     Other reaction(s): Muscle pain    Social History:   Social History   Socioeconomic History   Marital status: Widowed    Spouse name: Not on file   Number of children: Not on file   Years of education: Not on file   Highest education level: Not on file  Occupational History   Not on file  Tobacco Use   Smoking status: Every Day    Packs/day: 0.50    Types: Cigarettes   Smokeless tobacco: Never  Vaping Use   Vaping Use: Never used  Substance and Sexual Activity   Alcohol use: Not Currently   Drug use: Not on file   Sexual activity: Not Currently  Other Topics Concern   Not on file  Social History Narrative   Not on file   Social Determinants of Health   Financial Resource Strain: Not on file  Food Insecurity: Not on file  Transportation Needs: Not on file  Physical Activity: Not on file  Stress: Not on file  Social Connections: Not on file  Intimate Partner Violence: Not on file    Family History:    Family History  Problem Relation Age of Onset   Congestive Heart Failure Mother    Diabetes Mother    Congestive Heart Failure Brother      ROS:  Please see the history of present illness.  See HPI All other ROS reviewed and negative.     Physical Exam/Data:   Vitals:   06/27/21 2355 06/28/21 0422 06/28/21 0525 06/28/21 0801   BP: 137/88 136/88  (!) 142/93  Pulse: 78 80  71  Resp: 18 16  19   Temp: 97.8 F (36.6 C) 98.5 F (36.9 C)  98 F (36.7 C)  TempSrc: Oral Oral    SpO2: 90% 99%  91%  Weight:   75.5 kg   Height:        Intake/Output Summary (Last 24 hours) at 06/28/2021 1142 Last data filed at 06/28/2021 0900 Gross per 24 hour  Intake 440 ml  Output 150 ml  Net 290 ml      06/28/2021    5:25 AM 06/27/2021    6:01 AM 06/26/2021   11:00 AM  Last 3 Weights  Weight (lbs) 166 lb 8 oz 163 lb 163 lb 12.8 oz  Weight (kg) 75.524 kg 73.936 kg 74.299 kg     Body mass index is 26.08 kg/m.  General:  Well nourished, well developed, in no acute distress HEENT: normal Neck: no JVD Vascular: No carotid bruits; Distal pulses 2+ bilaterally Cardiac:  normal S1, S2; RRR; no murmur  Lungs:  clear to auscultation bilaterally, no wheezing, rhonchi or rales  Abd: soft, nontender, no hepatomegaly  Ext: 3+ edema Musculoskeletal:  No deformities, BUE and BLE strength normal and equal Skin: warm and dry  Neuro:  CNs 2-12 intact, no focal abnormalities noted Psych:  Normal affect   EKG:  The EKG was personally reviewed and demonstrates:  nsr with PAC's Telemetry:  Telemetry was personally reviewed and demonstrates:  NSR  Relevant CV Studies: 2D echo reviewed  Laboratory Data:  High Sensitivity Troponin:   Recent Labs  Lab 06/23/21 1110 06/23/21 1620  TROPONINIHS 68* 60*     Chemistry Recent Labs  Lab 06/26/21 0445 06/27/21 0701 06/28/21 0425  NA 138 136 132*  K 3.7 4.0 4.8  CL 107 106 105  CO2 26 25 22   GLUCOSE 73 94 187*  BUN 37* 43* 49*  CREATININE 2.46* 3.30* 4.12*  CALCIUM 8.6* 8.5* 8.6*  MG 2.0 1.9 2.0  GFRNONAA 28* 20* 15*  ANIONGAP 5 5 5     Recent Labs  Lab 06/23/21 1110  PROT 6.6  ALBUMIN 2.6*  AST 36  ALT 21  ALKPHOS 148*  BILITOT 0.3   Lipids No results for input(s): "CHOL", "TRIG", "HDL", "LABVLDL", "LDLCALC", "CHOLHDL" in the last 168 hours.  Hematology Recent Labs   Lab 06/26/21 0445 06/27/21 0701 06/28/21 0425  WBC 5.2 4.9 5.8  RBC 3.09* 3.23* 3.49*  HGB 8.8* 9.1* 10.1*  HCT 27.9* 28.8* 32.0*  MCV 90.3 89.2 91.7  MCH 28.5 28.2 28.9  MCHC 31.5 31.6 31.6  RDW 14.8 14.9 15.0  PLT 338 336 343   Thyroid No results for input(s): "TSH", "FREET4" in the last 168 hours.  BNP Recent Labs  Lab 06/23/21 1110  BNP 2,973.2*    DDimer No results for input(s): "DDIMER" in the last 168 hours.   Radiology/Studies:  ECHOCARDIOGRAM COMPLETE  Result Date: 06/26/2021    ECHOCARDIOGRAM REPORT   Patient Name:   Shawn [name] Date of Exam: 06/26/2021 Medical Rec #:  [id number]           Height:       67.0 in Accession #:    [id number]          Weight:       130.1 lb Date of Birth:  Oct 04, 1955            BSA:          1.684 m Patient Age:    66 years            BP:           143/85 mmHg Patient Gender: M                   HR:           81 bpm. Exam Location:  [facility] Procedure: 2D Echo, Color Doppler, Cardiac Doppler and Strain Analysis Indications:     Anasarca  History:         Patient has prior history of Echocardiogram examinations, most  recent 01/22/2021. CHF and Non-ischemic cardiomyopathy, CKD,                  stage II; Risk Factors:Hypertension and Diabetes.  Sonographer:     Humphrey Rolls Referring Phys:  [id number] Inetta Fermo LAI Diagnosing Phys: Julien Nordmann MD  Sonographer Comments: Global longitudinal strain was attempted. IMPRESSIONS  1. Left ventricular ejection fraction, by estimation, is 25 to 30%. The left ventricle has severely decreased function. The left ventricle demonstrates global hypokinesis. The left ventricular internal cavity size was mildly dilated. Left ventricular diastolic parameters are consistent with Grade II diastolic dysfunction (pseudonormalization). The average left ventricular global longitudinal strain is 9.0 %. The global longitudinal strain is abnormal.  2. Right ventricular systolic function is normal. The right ventricular  size is normal. Tricuspid regurgitation signal is inadequate for assessing PA pressure.  3. Left atrial size was mildly dilated.  4. A small pericardial effusion is present. The pericardial effusion is circumferential. There is no evidence of cardiac tamponade. Moderate pleural effusion in the left lateral region.  5. The mitral valve is normal in structure. Moderate mitral valve regurgitation. No evidence of mitral stenosis.  6. The aortic valve is tricuspid. Aortic valve regurgitation is not visualized. No aortic stenosis is present.  7. The inferior vena cava is dilated in size with >50% respiratory variability, suggesting right atrial pressure of 8 mmHg. FINDINGS  Left Ventricle: Left ventricular ejection fraction, by estimation, is 25 to 30%. The left ventricle has severely decreased function. The left ventricle demonstrates global hypokinesis. The average left ventricular global longitudinal strain is 9.0 %. The global longitudinal strain is abnormal. The left ventricular internal cavity size was mildly dilated. There is no left ventricular hypertrophy. Left ventricular diastolic parameters are consistent with Grade II diastolic dysfunction (pseudonormalization). Right Ventricle: The right ventricular size is normal. No increase in right ventricular wall thickness. Right ventricular systolic function is normal. Tricuspid regurgitation signal is inadequate for assessing PA pressure. Left Atrium: Left atrial size was mildly dilated. Right Atrium: Right atrial size was normal in size. Pericardium: A small pericardial effusion is present. The pericardial effusion is circumferential. There is no evidence of cardiac tamponade. Mitral Valve: The mitral valve is normal in structure. Moderate mitral valve regurgitation. No evidence of mitral valve stenosis. Tricuspid Valve: The tricuspid valve is normal in structure. Tricuspid valve regurgitation is mild . No evidence of tricuspid stenosis. Aortic Valve: The aortic  valve is tricuspid. Aortic valve regurgitation is not visualized. No aortic stenosis is present. Aortic valve mean gradient measures 2.0 mmHg. Aortic valve peak gradient measures 4.4 mmHg. Aortic valve area, by VTI measures 2.91 cm. Pulmonic Valve: The pulmonic valve was normal in structure. Pulmonic valve regurgitation is not visualized. No evidence of pulmonic stenosis. Aorta: The aortic root is normal in size and structure. Ascending aorta measurements are within normal limits for age when indexed to body surface area. Venous: The inferior vena cava is dilated in size with greater than 50% respiratory variability, suggesting right atrial pressure of 8 mmHg. IAS/Shunts: No atrial level shunt detected by color flow Doppler. Additional Comments: There is a moderate pleural effusion in the left lateral region.  LEFT VENTRICLE PLAX 2D LVIDd:         5.17 cm      Diastology LVIDs:         4.65 cm      LV e' medial:    2.83 cm/s LV PW:         1.45  cm      LV E/e' medial:  29.5 LV IVS:        1.00 cm      LV e' lateral:   6.85 cm/s LVOT diam:     2.00 cm      LV E/e' lateral: 12.2 LV SV:         56 LV SV Index:   33           2D Longitudinal Strain LVOT Area:     3.14 cm     2D Strain GLS Avg:     9.0 %  LV Volumes (MOD) LV vol d, MOD A2C: 183.0 ml LV vol d, MOD A4C: 152.0 ml LV vol s, MOD A2C: 111.0 ml LV vol s, MOD A4C: 115.0 ml LV SV MOD A2C:     72.0 ml LV SV MOD A4C:     152.0 ml LV SV MOD BP:      52.7 ml RIGHT VENTRICLE RV Basal diam:  3.13 cm RV S prime:     10.00 cm/s TAPSE (M-mode): 2.5 cm LEFT ATRIUM             Index        RIGHT ATRIUM           Index LA diam:        4.50 cm 2.67 cm/m   RA Area:     15.50 cm LA Vol (A2C):   67.7 ml 40.19 ml/m  RA Volume:   40.80 ml  24.22 ml/m LA Vol (A4C):   73.0 ml 43.34 ml/m LA Biplane Vol: 70.3 ml 41.74 ml/m  AORTIC VALVE                    PULMONIC VALVE AV Area (Vmax):    3.20 cm     PV Vmax:       0.67 m/s AV Area (Vmean):   2.87 cm     PV Peak grad:  1.8  mmHg AV Area (VTI):     2.91 cm AV Vmax:           105.00 cm/s AV Vmean:          74.000 cm/s AV VTI:            0.192 m AV Peak Grad:      4.4 mmHg AV Mean Grad:      2.0 mmHg LVOT Vmax:         107.00 cm/s LVOT Vmean:        67.600 cm/s LVOT VTI:          0.178 m LVOT/AV VTI ratio: 0.93  AORTA Ao Root diam: 3.80 cm MITRAL VALVE MV Area (PHT): 7.82 cm       SHUNTS MV Decel Time: 97 msec        Systemic VTI:  0.18 m MR Peak grad:    129.5 mmHg   Systemic Diam: 2.00 cm MR Mean grad:    79.0 mmHg MR Vmax:         569.00 cm/s MR Vmean:        419.0 cm/s MR PISA:         1.57 cm MR PISA Eff ROA: 6 mm MR PISA Radius:  0.50 cm MV E velocity: 83.60 cm/s MV A velocity: 59.10 cm/s MV E/A ratio:  1.41 Julien Nordmann MD Electronically signed by Julien Nordmann MD Signature Date/Time: 06/26/2021/5:10:11 PM    Final    US RENAL  Result Date: 06/26/2021 CLINICAL DATA:  Acute kidney injury. EXAM: RENAL / URINARY TRACT ULTRASOUND COMPLETE COMPARISON:  Renal ultrasound 01/19/2019; ultrasound for ultrasound-guided paracentesis 06/25/2021; CT abdomen and pelvis 06/23/2021 FINDINGS: Right Kidney: Renal measurements: 10.7 x 6.1 x 6.3 cm = volume: 214 mL. Increased cortical echogenicity is again seen. No mass or hydronephrosis visualized. Left Kidney: Renal measurements: 12.4 x 6.5 x 6.0 cm = volume: 253 mL. Increased cortical echogenicity is again seen. No mass or hydronephrosis visualized. Bladder: Possible mild irregularity of the bladder wall, however this is diffuse. No definite focal mass. On recent 06/23/2021 CT, no focal urinary bladder wall thickening is seen. Other: Moderate ascites.  Left pleural fluid is noted. IMPRESSION: 1. There is again bilateral increased cortical echogenicity as can be seen with chronic medical renal disease. No hydronephrosis. 2. Moderate ascites. 3. Left pleural effusion. Bilateral pleural effusions were seen on recent CT. Electronically Signed   By: Neita Garnet M.D.   On: 06/26/2021 16:27    US Paracentesis  Result Date: 06/25/2021 INDICATION: Patient with history of CHF admitted with fluid overload and ascites on CT. Request to IR for diagnostic and therapeutic paracentesis. EXAM: ULTRASOUND GUIDED DIAGNOSTIC AND THERAPEUTIC PARACENTESIS MEDICATIONS: 10 mL 1% lidocaine COMPLICATIONS: None immediate. PROCEDURE: Informed written consent was obtained from the patient after a discussion of the risks, benefits and alternatives to treatment. A timeout was performed prior to the initiation of the procedure. Initial ultrasound scanning demonstrates a large amount of ascites within the right lower abdominal quadrant. The right lower abdomen was prepped and draped in the usual sterile fashion. 1% lidocaine was used for local anesthesia. Following this, a 19 gauge, 7-cm, Yueh catheter was introduced. An ultrasound image was saved for documentation purposes. The paracentesis was performed. The catheter was removed and a dressing was applied. The patient tolerated the procedure well without immediate post procedural complication. FINDINGS: A total of approximately 5.3 L of clear yellow fluid was removed. Samples were sent to the laboratory as requested by the clinical team. IMPRESSION: Successful ultrasound-guided paracentesis yielding 5.3 liters of peritoneal fluid. Read by Lynnette Caffey, PA-C Electronically Signed   By: Roanna Banning M.D.   On: 06/25/2021 13:22     Assessment and Plan:   Acute on chronic systolic heart failure - the patient's volume status is improved but his renal function has worsened. I would consider adding IV milrinone. This could be started tomorrow. Might place a PICC line so that a Co-ox be obtained to eval cardiac output. Wt is not better. Acute on chronic renal failure, approaching End stage - note nephrology service input. If no improvement he will need HD access.   Risk Assessment/Risk Scores:         {Complete the following score calculators/questions to meet  required metrics.  Press F2         :[id number] For questions or updates, please contact [facility] Please consult [email or web address] for contact info under    Signed, Lewayne Bunting, MD  06/28/2021 11:42 AM

## 2021-06-29 ENCOUNTER — Inpatient Hospital Stay: Payer: No Typology Code available for payment source

## 2021-06-29 ENCOUNTER — Encounter
Admission: EM | Disposition: A | Discharge status: Home / Self Care | Payer: Self-pay | Source: Home / Self Care | Attending: Hospitalist

## 2021-06-29 DIAGNOSIS — N179 Acute kidney failure, unspecified: Secondary | ICD-10-CM | POA: Diagnosis not present

## 2021-06-29 DIAGNOSIS — N189 Chronic kidney disease, unspecified: Secondary | ICD-10-CM | POA: Diagnosis not present

## 2021-06-29 DIAGNOSIS — R601 Generalized edema: Secondary | ICD-10-CM | POA: Diagnosis not present

## 2021-06-29 DIAGNOSIS — I5023 Acute on chronic systolic (congestive) heart failure: Secondary | ICD-10-CM | POA: Diagnosis not present

## 2021-06-29 HISTORY — PX: CENTRAL LINE INSERTION: CATH118232

## 2021-06-29 HISTORY — PX: RIGHT HEART CATH: CATH118263

## 2021-06-29 LAB — BASIC METABOLIC PANEL
Anion gap: 9 (ref 5–15)
BUN: 54 mg/dL — ABNORMAL HIGH (ref 8–23)
CO2: 22 mmol/L (ref 22–32)
Calcium: 8.6 mg/dL — ABNORMAL LOW (ref 8.9–10.3)
Chloride: 104 mmol/L (ref 98–111)
Creatinine, Ser: 4.26 mg/dL — ABNORMAL HIGH (ref 0.61–1.24)
GFR, Estimated: 15 mL/min — ABNORMAL LOW (ref >60)
Glucose, Bld: 171 mg/dL — ABNORMAL HIGH (ref 70–99)
Potassium: 4.3 mmol/L (ref 3.5–5.1)
Sodium: 135 mmol/L (ref 135–145)

## 2021-06-29 LAB — CBC
HCT: 29.6 % — ABNORMAL LOW (ref 39.0–52.0)
Hemoglobin: 9.3 g/dL — ABNORMAL LOW (ref 13.0–17.0)
MCH: 28.4 pg (ref 26.0–34.0)
MCHC: 31.4 g/dL (ref 30.0–36.0)
MCV: 90.2 fL (ref 80.0–100.0)
Platelets: 360 10*3/uL (ref 150–400)
RBC: 3.28 MIL/uL — ABNORMAL LOW (ref 4.22–5.81)
RDW: 14.7 % (ref 11.5–15.5)
WBC: 5.3 10*3/uL (ref 4.0–10.5)
nRBC: 0 % (ref 0.0–0.2)

## 2021-06-29 LAB — MRSA NEXT GEN BY PCR, NASAL: MRSA by PCR Next Gen: NOT DETECTED

## 2021-06-29 LAB — MAGNESIUM: Magnesium: 2 mg/dL (ref 1.7–2.4)

## 2021-06-29 LAB — GLUCOSE, CAPILLARY
Glucose-Capillary: 96 mg/dL (ref 70–99)
Glucose-Capillary: 165 mg/dL — ABNORMAL HIGH (ref 70–99)
Glucose-Capillary: 160 mg/dL — ABNORMAL HIGH (ref 70–99)
Glucose-Capillary: 284 mg/dL — ABNORMAL HIGH (ref 70–99)

## 2021-06-29 SURGERY — RIGHT HEART CATH
Anesthesia: LOCAL

## 2021-06-29 MED ORDER — SODIUM CHLORIDE 0.9 % IV SOLN
250.0000 mL | INTRAVENOUS | Status: DC | PRN
  Administered 2021-06-29 – 2021-07-02 (×2): 250 mL via INTRAVENOUS

## 2021-06-29 MED ORDER — SODIUM CHLORIDE 0.9% FLUSH: 3.0000 mL | INTRAVENOUS | Status: DC | PRN

## 2021-06-29 MED ORDER — MILRINONE LACTATE IN DEXTROSE 20-5 MG/100ML-% IV SOLN
0.1250 ug/kg/min | INTRAVENOUS | Status: DC
  Administered 2021-06-29 – 2021-07-02 (×5): 0.25 ug/kg/min via INTRAVENOUS
  Administered 2021-07-03: 0.125 ug/kg/min via INTRAVENOUS
  Filled 2021-06-29 (×6): qty 100

## 2021-06-29 MED ORDER — CARVEDILOL 3.125 MG PO TABS
3.1250 mg | ORAL_TABLET | Freq: Two times a day (BID) | ORAL | Status: DC
  Administered 2021-06-29 – 2021-07-07 (×16): 3.125 mg via ORAL
  Filled 2021-06-29 (×16): qty 1

## 2021-06-29 MED ORDER — SODIUM CHLORIDE 0.9 % IV SOLN: INTRAVENOUS | Status: DC

## 2021-06-29 MED ORDER — LIDOCAINE HCL (PF) 1 % IJ SOLN
INTRAMUSCULAR | Status: DC | PRN
  Administered 2021-06-29: 8 mL

## 2021-06-29 MED ORDER — ISOSORBIDE DINITRATE 10 MG PO TABS
10.0000 mg | ORAL_TABLET | Freq: Three times a day (TID) | ORAL | Status: DC
  Administered 2021-06-29 – 2021-07-03 (×11): 10 mg via ORAL
  Filled 2021-06-29 (×12): qty 1

## 2021-06-29 MED ORDER — HYDRALAZINE HCL 50 MG PO TABS
25.0000 mg | ORAL_TABLET | Freq: Three times a day (TID) | ORAL | Status: DC
  Administered 2021-06-29 – 2021-07-03 (×11): 25 mg via ORAL
  Filled 2021-06-29 (×11): qty 1

## 2021-06-29 MED ORDER — SODIUM CHLORIDE 0.9% FLUSH
3.0000 mL | Freq: Two times a day (BID) | INTRAVENOUS | Status: DC
Start: 2021-06-29 — End: 2021-06-29

## 2021-06-29 MED ORDER — HEPARIN (PORCINE) IN NACL 1000-0.9 UT/500ML-% IV SOLN
INTRAVENOUS | Status: DC | PRN
  Administered 2021-06-29 (×2): 500 mL

## 2021-06-29 MED ORDER — LIDOCAINE HCL 1 % IJ SOLN
INTRAMUSCULAR | Status: AC
  Filled 2021-06-29: qty 20

## 2021-06-29 MED ORDER — HYDRALAZINE HCL 20 MG/ML IJ SOLN
10.0000 mg | INTRAMUSCULAR | Status: AC | PRN
  Administered 2021-06-29: 10 mg via INTRAVENOUS
  Filled 2021-06-29: qty 1

## 2021-06-29 MED ORDER — SODIUM CHLORIDE 0.9% FLUSH
3.0000 mL | Freq: Two times a day (BID) | INTRAVENOUS | Status: DC
  Administered 2021-06-29 – 2021-07-06 (×14): 3 mL via INTRAVENOUS

## 2021-06-29 MED ORDER — SODIUM CHLORIDE 0.9 % IV SOLN
250.0000 mL | INTRAVENOUS | Status: DC | PRN
Start: 2021-06-29 — End: 2021-06-29
  Administered 2021-06-29: 1000 mL via INTRAVENOUS

## 2021-06-29 SURGICAL SUPPLY — 9 items
CANNULA 5F STIFF (CANNULA) ×1 IMPLANT
CATH BALLN WEDGE 5F 110CM (CATHETERS) ×1 IMPLANT
KIT CV MULTILUMEN 7FR 20 (SET/KITS/TRAYS/PACK) ×2
KIT CV MULTILUMEN 7FR 20 SUB (SET/KITS/TRAYS/PACK) IMPLANT
PACK CARDIAC CATH (CUSTOM PROCEDURE TRAY) ×2 IMPLANT
PROTECTION STATION PRESSURIZED (MISCELLANEOUS) ×2 IMPLANT
SET ATX SIMPLICITY (MISCELLANEOUS) ×1 IMPLANT
SHEATH AVANTI 5FR X 11CM (SHEATH) ×1 IMPLANT
STATION PROTECTION PRESSURIZED (MISCELLANEOUS) IMPLANT

## 2021-06-29 NOTE — Interval H&P Note (Signed)
History and Physical Interval Note:  06/29/2021 11:21 AM  Shawn [name]  has presented today for surgery, with the diagnosis of Acute on chronic HFrEF.  The various methods of treatment have been discussed with the patient and family. After consideration of risks, benefits and other options for treatment, the patient has consented to  Procedure(s): RIGHT HEART CATH (N/A) CENTRAL LINE INSERTION (N/A) as a surgical intervention.  The patient's history has been reviewed, patient examined, no change in status, stable for surgery.  I have reviewed the patient's chart and labs.  Questions were answered to the patient's satisfaction.     Agustina Witzke

## 2021-06-29 NOTE — Progress Notes (Signed)
[facility]  ROUNDING NOTE   Subjective:   Shawn [name] is a 66 y.o. AA male with past medical conditions of CVA, diabetes, CHF, hypertension, and chronic kidney disease Iib. Patient presented to ED with lower extremity swelling and scrotal. Patient is admitted for Anasarca [R60.1] Testicle swelling [N50.89] Acute on chronic congestive heart failure, unspecified heart failure type [facility]) [I50.9]  Patient is known to our clinic and is followed by Dr Thedore Mins. He states he was taken off his diuretics and has not taken any. He began noticing the swelling 3-4 days ago. He denies shortness of breath. Denies nausea, vomiting and diarrhea. Denies chest pain.   Upon evaluation in the ER patient was found to have glucose 387, BUN 28, and creatinine 1.27. Creatinine has risen to 2.46. CT CT abdomen pelvis shows small volume fluid overload with ascites, diffuse anasarca.  Chest x-ray shows congestive heart failure with small bilateral pleural effusions.  Paracentesis performed with 5.3 L fluid removed   Update: Patient resting in bed Alert and oriented Tolerating small meals Lower extremity edema improving  Furosemide drip at 4mg /hr UOP   Objective:  Vital signs in last 24 hours:  Temp:  [97.4 F (36.3 C)-98.5 F (36.9 C)] 97.5 F (36.4 C) (06/26 1056) Pulse Rate:  [0-103] 66 (06/26 1217) Resp:  [13-19] 15 (06/26 1217) BP: (137-156)/(88-108) 145/91 (06/26 1217) SpO2:  [84 %-94 %] 93 % (06/26 1217)  Weight change:  Filed Weights   06/26/21 1100 06/27/21 0601 06/28/21 0525  Weight: 74.3 kg 73.9 kg 75.5 kg    Intake/Output: I/O last 3 completed shifts: In: 978.5 [P.O.:840; I.V.:21.5; IV Piggyback:117] Out: 800 [Urine:800]   Intake/Output this shift:  Total I/O In: 240 [P.O.:240] Out: 250 [Urine:250]  Physical Exam: General: NAD  Head: Normocephalic, atraumatic. Moist oral mucosal membranes  Eyes: Anicteric  Lungs:  Clear to auscultation, normal effort   Heart: Regular rate and rhythm  Abdomen:  Soft, nontender  Extremities:  1+ peripheral edema.  Neurologic: Nonfocal, moving all four extremities  Skin: No lesions  Access: None  GU-scrotal edema  Basic Metabolic Panel: Recent Labs  Lab 06/25/21 0506 06/26/21 0445 06/27/21 0701 06/28/21 0425 06/29/21 0418  NA 137 138 136 132* 135  K 4.7 3.7 4.0 4.8 4.3  CL 106 107 106 105 104  CO2 25 26 25 22 22   GLUCOSE 225* 73 94 187* 171*  BUN 32* 37* 43* 49* 54*  CREATININE 1.78* 2.46* 3.30* 4.12* 4.26*  CALCIUM 8.7* 8.6* 8.5* 8.6* 8.6*  MG 2.0 2.0 1.9 2.0 2.0     Liver Function Tests: Recent Labs  Lab 06/23/21 1110  AST 36  ALT 21  ALKPHOS 148*  BILITOT 0.3  PROT 6.6  ALBUMIN 2.6*    No results for input(s): "LIPASE", "AMYLASE" in the last 168 hours. No results for input(s): "AMMONIA" in the last 168 hours.  CBC: Recent Labs  Lab 06/25/21 0506 06/26/21 0445 06/27/21 0701 06/28/21 0425 06/29/21 0418  WBC 5.6 5.2 4.9 5.8 5.3  HGB 9.6* 8.8* 9.1* 10.1* 9.3*  HCT 30.4* 27.9* 28.8* 32.0* 29.6*  MCV 90.5 90.3 89.2 91.7 90.2  PLT 351 338 336 343 360     Cardiac Enzymes: No results for input(s): "CKTOTAL", "CKMB", "CKMBINDEX", "TROPONINI" in the last 168 hours.  BNP: Invalid input(s): "POCBNP"  CBG: Recent Labs  Lab 06/28/21 0803 06/28/21 1236 06/28/21 1604 06/28/21 1959 06/29/21 0854  GLUCAP 146* 154* 105* 113* 96     Microbiology: Results for  orders placed or performed during the hospital encounter of 06/23/21  Urine Culture     Status: Abnormal   Collection Time: 06/23/21 11:10 AM   Specimen: Urine, Clean Catch  Result Value Ref Range Status   Specimen Description   Final    URINE, CLEAN CATCH Performed at [facility], [address], [place], [place] [address]    Special Requests   Final    NONE Performed at [facility], [address] [place], [place] [address]    Culture MULTIPLE SPECIES PRESENT, SUGGEST RECOLLECTION  (A)  Final   Report Status 06/24/2021 FINAL  Final  Body fluid culture w Gram Stain     Status: None   Collection Time: 06/25/21 11:29 AM   Specimen: Peritoneal Washings  Result Value Ref Range Status   Specimen Description   Final    PERITONEAL Performed at [facility], [address], [place], [place] [address]    Special Requests   Final    NONE Performed at [facility], [address] [place], [place] [address]    Gram Stain NO WBC SEEN NO ORGANISMS SEEN   Final   Culture   Final    NO GROWTH 3 DAYS Performed at [facility], [address], [place], [place] [address]    Report Status 06/28/2021 FINAL  Final    Coagulation Studies: No results for input(s): "LABPROT", "INR" in the last 72 hours.  Urinalysis: No results for input(s): "COLORURINE", "LABSPEC", "PHURINE", "GLUCOSEU", "HGBUR", "BILIRUBINUR", "KETONESUR", "PROTEINUR", "UROBILINOGEN", "NITRITE", "LEUKOCYTESUR" in the last 72 hours.  Invalid input(s): "APPERANCEUR"    Imaging: DG Chest 1 View  Result Date: 06/28/2021 CLINICAL DATA:  Patient admitted to hospital lower extremity edema. CHF. EXAM: CHEST  1 VIEW COMPARISON:  June 23, 2021 FINDINGS: Stable mild cardiomegaly. The hila and mediastinum are unremarkable. Diffuse increased interstitial opacities. Small bilateral effusions. More focal opacity in the right base. No pneumothorax. No other acute abnormalities. IMPRESSION: 1. Cardiomegaly, small effusions, and diffuse interstitial opacities. The interstitial opacities are favored represent pulmonary edema. 2. More focal opacity in the right base could represent atelectasis or developing infiltrate. Recommend attention on follow-up. Electronically Signed   By: Gerome Sam III M.D.   On: 06/28/2021 11:43     Medications:    sodium chloride 1,000 mL (06/29/21 1053)   [START ON 06/30/2021] sodium chloride     sodium chloride     furosemide (LASIX) 200 mg in dextrose 5 % 100 mL (2  mg/mL) infusion 4 mg/hr (06/29/21 1231)   milrinone      [MAR Hold] aspirin  81 mg Oral Daily   carvedilol  3.125 mg Oral BID WC   [MAR Hold] Chlorhexidine Gluconate Cloth  6 each Topical Daily   [MAR Hold] heparin injection (subcutaneous)  5,000 Units Subcutaneous Q8H   [MAR Hold] insulin aspart  0-15 Units Subcutaneous TID WC   [MAR Hold] insulin glargine-yfgn  10 Units Subcutaneous QHS   sodium chloride flush  3 mL Intravenous Q12H   sodium chloride flush  3 mL Intravenous Q12H   sodium chloride, sodium chloride, [MAR Hold] acetaminophen, [MAR Hold] docusate sodium, hydrALAZINE, [MAR Hold] polyethylene glycol, sodium chloride flush, sodium chloride flush, [MAR Hold] traMADol  Assessment/ Plan:  [name] is a 66 y.o.  male with past medical conditions of CVA, diabetes, CHF, hypertension, and chronic kidney disease IIib. Patient presented to ED with lower extremity swelling and scrotal. Patient is admitted for  Anasarca [R60.1] Testicle swelling [N50.89] Acute on chronic congestive heart failure, unspecified heart failure type ([name]) [I50.9]   Acute Kidney Injury on chronic kidney disease stage IIIb with baseline creatinine 1.27 06/23/21.  Acute kidney injury likely secondary to IV contrast exposure and fluid shifts.  Patient placed on furosemide drip to manage lower extremity edema and fluid overload seen on chest x-ray.  Lower extremity edema improving slowly.  Creatinine continues to increase however appears to be plateauing.  Foley catheter placed yesterday.  We will continue to monitor.    Lab Results  Component Value Date   CREATININE 4.26 (H) 06/29/2021   CREATININE 4.12 (H) 06/28/2021   CREATININE 3.30 (H) 06/27/2021    Intake/Output Summary (Last 24 hours) at 06/29/2021 1232 Last data filed at 06/29/2021 1100 Gross per 24 hour  Intake 978.53 ml  Output 1000 ml  Net -21.47 ml    2. Hypertension with chronic kidney disease.  Home regimen includes Carvedilol  and entresto. Entresto currently held. Restarting oral diuretics today.     3. 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 Home regimen includes NovoLog and glargine-yfgn.  Most recent hemoglobin A1c is 11.9 on 06/24/21.  Primary team to manage scale insulin   4.Anemia-Iron deficiency anemia   Latest Reference Range & Units 06/27/21 07:01  Iron 45 - 182 ug/dL 24 (L)  UIBC ug/dL 161  TIBC 096 - 045 ug/dL 409 (L)  Saturation Ratios 17.9 - 39.5 % 10 (L)  Ferritin 24 - 336 ng/mL 59  Folate >5.9 ng/mL 25.0  (L): Data is abnormally low Received IV iron yesterday.   5. Acute urinary retention Foley catheter placed yesterday.  6.  Acute on chronic systolic heart failure. ECHO on 06/26/21 shows EF 25-30% global hypokinesis, grade 2 diastolic dysfunction, moderate mitral regurgitation, and small pericardial effusion.  Remains on furosemide drip. Cardiology is following, considering inotropes.   Performing right heart cath today    LOS: 5 Dena Esperanza 6/26/202312:32 PM

## 2021-06-29 NOTE — Progress Notes (Addendum)
Progress Note  Patient Name: Shawn [name] Date of Encounter: 06/29/2021  [facility] Cardiologist: Julien Nordmann, MD   Subjective   No shortness of breath or chest pain.  Scrotum still swollen but getting better.  Leg edema still present.  Inpatient Medications    Scheduled Meds:  [MAR Hold] aspirin  81 mg Oral Daily   [MAR Hold] carvedilol  12.5 mg Oral BID WC   [MAR Hold] Chlorhexidine Gluconate Cloth  6 each Topical Daily   [MAR Hold] heparin injection (subcutaneous)  5,000 Units Subcutaneous Q8H   [MAR Hold] insulin aspart  0-15 Units Subcutaneous TID WC   [MAR Hold] insulin glargine-yfgn  10 Units Subcutaneous QHS   sodium chloride flush  3 mL Intravenous Q12H   Continuous Infusions:  sodium chloride 1,000 mL (06/29/21 1053)   [START ON 06/30/2021] sodium chloride     furosemide (LASIX) 200 mg in dextrose 5 % 100 mL (2 mg/mL) infusion Stopped (06/29/21 1106)   PRN Meds: sodium chloride, [MAR Hold] acetaminophen, [MAR Hold] docusate sodium, [MAR Hold] polyethylene glycol, sodium chloride flush, [MAR Hold] traMADol   Vital Signs    Vitals:   06/29/21 0008 06/29/21 0410 06/29/21 0859 06/29/21 1056  BP: (!) 149/91 (!) 147/91 (!) 141/96 (!) 149/91  Pulse: 73 71 67 69  Resp:   16 16  Temp: 98.2 F (36.8 C) (!) 97.4 F (36.3 C) 98.1 F (36.7 C) (!) 97.5 F (36.4 C)  TempSrc: Oral  Oral Oral  SpO2: 90% 93% 92% (!) 89%  Weight:      Height:        Intake/Output Summary (Last 24 hours) at 06/29/2021 1111 Last data filed at 06/29/2021 0953 Gross per 24 hour  Intake 978.53 ml  Output 750 ml  Net 228.53 ml      06/28/2021    5:25 AM 06/27/2021    6:01 AM 06/26/2021   11:00 AM  Last 3 Weights  Weight (lbs) 166 lb 8 oz 163 lb 163 lb 12.8 oz  Weight (kg) 75.524 kg 73.936 kg 74.299 kg      Telemetry    Normal sinus rhythm - Personally Reviewed  ECG    No new tracing.  Physical Exam   GEN: No acute distress.   Neck: JVP approximately 8  cm Cardiac: RRR, no murmurs, rubs, or gallops.  Respiratory: Clear to auscultation bilaterally. GI: Soft, nontender, non-distended  MS: 1+ pretibial edema; No deformity. Neuro:  Nonfocal  Psych: Normal affect   Labs    High Sensitivity Troponin:   Recent Labs  Lab 06/23/21 1110 06/23/21 1620  TROPONINIHS 68* 60*     Chemistry Recent Labs  Lab 06/23/21 1110 06/24/21 0526 06/27/21 0701 06/28/21 0425 06/29/21 0418  NA 138   < > 136 132* 135  K 3.5   < > 4.0 4.8 4.3  CL 105   < > 106 105 104  CO2 25   < > 25 22 22   GLUCOSE 387*   < > 94 187* 171*  BUN 28*   < > 43* 49* 54*  CREATININE 1.27*   < > 3.30* 4.12* 4.26*  CALCIUM 8.5*   < > 8.5* 8.6* 8.6*  MG  --    < > 1.9 2.0 2.0  PROT 6.6  --   --   --   --   ALBUMIN 2.6*  --   --   --   --   AST 36  --   --   --   --  ALT 21  --   --   --   --   ALKPHOS 148*  --   --   --   --   BILITOT 0.3  --   --   --   --   GFRNONAA >60   < > 20* 15* 15*  ANIONGAP 8   < > 5 5 9    < > = values in this interval not displayed.    Lipids No results for input(s): "CHOL", "TRIG", "HDL", "LABVLDL", "LDLCALC", "CHOLHDL" in the last 168 hours.  Hematology Recent Labs  Lab 06/27/21 0701 06/28/21 0425 06/29/21 0418  WBC 4.9 5.8 5.3  RBC 3.23* 3.49* 3.28*  HGB 9.1* 10.1* 9.3*  HCT 28.8* 32.0* 29.6*  MCV 89.2 91.7 90.2  MCH 28.2 28.9 28.4  MCHC 31.6 31.6 31.4  RDW 14.9 15.0 14.7  PLT 336 343 360   Thyroid No results for input(s): "TSH", "FREET4" in the last 168 hours.  BNP Recent Labs  Lab 06/23/21 1110  BNP 2,973.2*    DDimer No results for input(s): "DDIMER" in the last 168 hours.   Radiology    DG Chest 1 View  Result Date: 06/28/2021 CLINICAL DATA:  Patient admitted to hospital lower extremity edema. CHF. EXAM: CHEST  1 VIEW COMPARISON:  June 23, 2021 FINDINGS: Stable mild cardiomegaly. The hila and mediastinum are unremarkable. Diffuse increased interstitial opacities. Small bilateral effusions. More focal opacity in  the right base. No pneumothorax. No other acute abnormalities. IMPRESSION: 1. Cardiomegaly, small effusions, and diffuse interstitial opacities. The interstitial opacities are favored represent pulmonary edema. 2. More focal opacity in the right base could represent atelectasis or developing infiltrate. Recommend attention on follow-up. Electronically Signed   By: Gerome Sam III M.D.   On: 06/28/2021 11:43    Cardiac Studies   TTE (06/26/2021):  1. Left ventricular ejection fraction, by estimation, is 25 to 30%. The  left ventricle has severely decreased function. The left ventricle  demonstrates global hypokinesis. The left ventricular internal cavity size  was mildly dilated. Left ventricular  diastolic parameters are consistent with Grade II diastolic dysfunction  (pseudonormalization). The average left ventricular global longitudinal  strain is 9.0 %. The global longitudinal strain is abnormal.   2. Right ventricular systolic function is normal. The right ventricular  size is normal. Tricuspid regurgitation signal is inadequate for assessing  PA pressure.   3. Left atrial size was mildly dilated.   4. A small pericardial effusion is present. The pericardial effusion is  circumferential. There is no evidence of cardiac tamponade. Moderate  pleural effusion in the left lateral region.   5. The mitral valve is normal in structure. Moderate mitral valve  regurgitation. No evidence of mitral stenosis.   6. The aortic valve is tricuspid. Aortic valve regurgitation is not  visualized. No aortic stenosis is present.   7. The inferior vena cava is dilated in size with >50% respiratory  variability, suggesting right atrial pressure of 8 mmHg.  Patient Profile     66 y.o. male chronic HFrEF due to reported nonischemic cardiomyopathy, chronic kidney disease, diabetes mellitus, hypertension, and stroke, admitted with anasarca.  Assessment & Plan    Anasarca with acute on chronic HFrEF: I  remain most concerned that fluid retention is due to decompensated heart failure.  Worsening renal function in the face of diuresis suggest cardiorenal syndrome.  Minimal urine output documented yesterday, unclear if this is due to oliguria or incomplete recording. -Plan for right heart catheterization  and possible central line placement to allow for inotrope therapy.  Patient will need to be transferred to stepdown/ICU if inotropes are initiated. -Continue IV furosemide. -Decrease carvedilol to 3.125 mg twice daily in the setting of acute decompensated heart failure with suspected low output.  Low threshold to discontinue this altogether. -Continue to hold Entresto in the setting of acute kidney injury superimposed on chronic kidney disease. -Consider addition of BiDil for blood pressure control and GDMT in the setting of AKI, as blood pressure allows.  Acute kidney injury superimposed on chronic kidney disease: -Right heart catheterization, as above, with likely initiation of inotropes. -Avoid nephrotoxic agents.  Shared Decision Making/Informed Consent The risks [stroke (1 in 1000), death (1 in 1000), bleeding (1 in 200), allergic reaction [possibly serious] (1 in 200)], benefits (diagnostic support and management of coronary artery disease) and alternatives of a cardiac catheterization were discussed in detail with Mr. Cookston and he is willing to proceed.  For questions or updates, please contact [facility] Please consult [email or web address] for contact info under [facility].     Signed, Yvonne Kendall, MD  06/29/2021, 11:11 AM

## 2021-06-30 ENCOUNTER — Encounter: Payer: Self-pay | Admitting: Internal Medicine

## 2021-06-30 DIAGNOSIS — R601 Generalized edema: Secondary | ICD-10-CM | POA: Diagnosis not present

## 2021-06-30 DIAGNOSIS — N179 Acute kidney failure, unspecified: Secondary | ICD-10-CM | POA: Diagnosis not present

## 2021-06-30 DIAGNOSIS — N189 Chronic kidney disease, unspecified: Secondary | ICD-10-CM | POA: Diagnosis not present

## 2021-06-30 DIAGNOSIS — I5023 Acute on chronic systolic (congestive) heart failure: Secondary | ICD-10-CM | POA: Diagnosis not present

## 2021-06-30 LAB — CBC
HCT: 27.7 % — ABNORMAL LOW (ref 39.0–52.0)
Hemoglobin: 8.9 g/dL — ABNORMAL LOW (ref 13.0–17.0)
MCH: 29.1 pg (ref 26.0–34.0)
MCHC: 32.1 g/dL (ref 30.0–36.0)
MCV: 90.5 fL (ref 80.0–100.0)
Platelets: 331 10*3/uL (ref 150–400)
RBC: 3.06 MIL/uL — ABNORMAL LOW (ref 4.22–5.81)
RDW: 14.6 % (ref 11.5–15.5)
WBC: 5.2 10*3/uL (ref 4.0–10.5)
nRBC: 0 % (ref 0.0–0.2)

## 2021-06-30 LAB — BASIC METABOLIC PANEL
Anion gap: 6 (ref 5–15)
BUN: 55 mg/dL — ABNORMAL HIGH (ref 8–23)
CO2: 24 mmol/L (ref 22–32)
Calcium: 8.3 mg/dL — ABNORMAL LOW (ref 8.9–10.3)
Chloride: 106 mmol/L (ref 98–111)
Creatinine, Ser: 3.98 mg/dL — ABNORMAL HIGH (ref 0.61–1.24)
GFR, Estimated: 16 mL/min — ABNORMAL LOW (ref >60)
Glucose, Bld: 265 mg/dL — ABNORMAL HIGH (ref 70–99)
Potassium: 4.1 mmol/L (ref 3.5–5.1)
Sodium: 136 mmol/L (ref 135–145)

## 2021-06-30 LAB — COOXEMETRY PANEL
Carboxyhemoglobin: 1.5 % (ref 0.5–1.5)
Methemoglobin: <0.7 % (ref 0.0–1.5)
O2 Saturation: 75.5 %
Total hemoglobin: 12.2 g/dL (ref 12.0–16.0)
Total oxygen content: 73.9 %

## 2021-06-30 LAB — MAGNESIUM: Magnesium: 2 mg/dL (ref 1.7–2.4)

## 2021-06-30 LAB — GLUCOSE, CAPILLARY
Glucose-Capillary: 238 mg/dL — ABNORMAL HIGH (ref 70–99)
Glucose-Capillary: 238 mg/dL — ABNORMAL HIGH (ref 70–99)
Glucose-Capillary: 218 mg/dL — ABNORMAL HIGH (ref 70–99)
Glucose-Capillary: 195 mg/dL — ABNORMAL HIGH (ref 70–99)

## 2021-06-30 MED ORDER — INSULIN GLARGINE-YFGN 100 UNIT/ML ~~LOC~~ SOLN
12.0000 [IU] | Freq: Every day | SUBCUTANEOUS | Status: DC
  Administered 2021-06-30 – 2021-07-03 (×4): 12 [IU] via SUBCUTANEOUS
  Filled 2021-06-30 (×4): qty 0.12

## 2021-06-30 MED ORDER — NICOTINE 21 MG/24HR TD PT24
21.0000 mg | MEDICATED_PATCH | Freq: Every day | TRANSDERMAL | Status: DC
  Administered 2021-06-30 – 2021-07-07 (×8): 21 mg via TRANSDERMAL
  Filled 2021-06-30 (×8): qty 1

## 2021-06-30 MED ORDER — INSULIN ASPART 100 UNIT/ML IJ SOLN
2.0000 [IU] | Freq: Three times a day (TID) | INTRAMUSCULAR | Status: DC
Start: 2021-06-30 — End: 2021-07-04
  Administered 2021-06-30 – 2021-07-04 (×11): 2 [IU] via SUBCUTANEOUS
  Filled 2021-06-30 (×9): qty 1

## 2021-06-30 MED ORDER — FUROSEMIDE 10 MG/ML IJ SOLN
40.0000 mg | Freq: Once | INTRAMUSCULAR | Status: AC
  Administered 2021-06-30: 40 mg via INTRAVENOUS
  Filled 2021-06-30: qty 4

## 2021-06-30 NOTE — Inpatient Diabetes Management (Signed)
Inpatient Diabetes Program Recommendations  AACE/ADA: New Consensus Statement on Inpatient Glycemic Control (2015)  Target Ranges:  Prepandial:   less than 140 mg/dL      Peak postprandial:   less than 180 mg/dL (1-2 hours)      Critically ill patients:  140 - 180 mg/dL    Latest Reference Range & Units 06/30/21 07:30 06/30/21 11:25  Glucose-Capillary 70 - 99 mg/dL 657 (H)  5 units Novolog  238 (H)  5 units Novolog   (H): Data is abnormally high    Home DM Meds: Semglee 25 units daily     Novolog 6 units TID with meals   Current Orders: Semglee 10 units QHS      Novolog Moderate Correction Scale/ SSI (0-15 units) TID AC     MD- Note CBGs >200 today.  Please consider:  1. Increase Semglee slightly to 12 units QHS  2. Start Novolog Meal Coverage: Novolog 2 units TID with meals HOLD if pt eats <50% meals    --Will follow patient during hospitalization--  Ambrose Finland RN, MSN, CDE Diabetes Coordinator Inpatient Glycemic Control Team Team Pager: [phone] (8a-5p)

## 2021-07-01 DIAGNOSIS — D509 Iron deficiency anemia, unspecified: Secondary | ICD-10-CM

## 2021-07-01 DIAGNOSIS — I5023 Acute on chronic systolic (congestive) heart failure: Secondary | ICD-10-CM | POA: Diagnosis not present

## 2021-07-01 DIAGNOSIS — N1831 Chronic kidney disease, stage 3a: Secondary | ICD-10-CM | POA: Diagnosis not present

## 2021-07-01 LAB — BASIC METABOLIC PANEL
Anion gap: 9 (ref 5–15)
BUN: 55 mg/dL — ABNORMAL HIGH (ref 8–23)
CO2: 23 mmol/L (ref 22–32)
Calcium: 8.5 mg/dL — ABNORMAL LOW (ref 8.9–10.3)
Chloride: 104 mmol/L (ref 98–111)
Creatinine, Ser: 3.93 mg/dL — ABNORMAL HIGH (ref 0.61–1.24)
GFR, Estimated: 16 mL/min — ABNORMAL LOW (ref >60)
Glucose, Bld: 115 mg/dL — ABNORMAL HIGH (ref 70–99)
Potassium: 4 mmol/L (ref 3.5–5.1)
Sodium: 136 mmol/L (ref 135–145)

## 2021-07-01 LAB — CBC
HCT: 26.9 % — ABNORMAL LOW (ref 39.0–52.0)
Hemoglobin: 8.6 g/dL — ABNORMAL LOW (ref 13.0–17.0)
MCH: 28.7 pg (ref 26.0–34.0)
MCHC: 32 g/dL (ref 30.0–36.0)
MCV: 89.7 fL (ref 80.0–100.0)
Platelets: 344 10*3/uL (ref 150–400)
RBC: 3 MIL/uL — ABNORMAL LOW (ref 4.22–5.81)
RDW: 14.8 % (ref 11.5–15.5)
WBC: 6.3 10*3/uL (ref 4.0–10.5)
nRBC: 0 % (ref 0.0–0.2)

## 2021-07-01 LAB — MAGNESIUM: Magnesium: 1.7 mg/dL (ref 1.7–2.4)

## 2021-07-01 LAB — COOXEMETRY PANEL
Carboxyhemoglobin: 1.6 % — ABNORMAL HIGH (ref 0.5–1.5)
Methemoglobin: 1.1 % (ref 0.0–1.5)
O2 Saturation: 82.4 %
Total hemoglobin: 9.5 g/dL — ABNORMAL LOW (ref 12.0–16.0)
Total oxygen content: 80.2 %

## 2021-07-01 LAB — GLUCOSE, CAPILLARY
Glucose-Capillary: 116 mg/dL — ABNORMAL HIGH (ref 70–99)
Glucose-Capillary: 143 mg/dL — ABNORMAL HIGH (ref 70–99)
Glucose-Capillary: 197 mg/dL — ABNORMAL HIGH (ref 70–99)
Glucose-Capillary: 238 mg/dL — ABNORMAL HIGH (ref 70–99)

## 2021-07-01 LAB — LIPOPROTEIN A (LPA): Lipoprotein (a): 398.7 nmol/L — ABNORMAL HIGH (ref <75.0)

## 2021-07-01 MED ORDER — MAGNESIUM SULFATE 2 GM/50ML IV SOLN
2.0000 g | Freq: Once | INTRAVENOUS | Status: AC
  Administered 2021-07-01: 2 g via INTRAVENOUS
  Filled 2021-07-01: qty 50

## 2021-07-01 NOTE — Assessment & Plan Note (Signed)
Patient was started on milrinone and Lasix infusion and started getting some diuresis.  Renal function seems stable but remained elevated than baseline. Nephrology and cardiology on board.  Output and edema improving. -Continue to monitor

## 2021-07-01 NOTE — Assessment & Plan Note (Signed)
--  s/p IV iron --plan to discharge on oral iron supplement.

## 2021-07-01 NOTE — Progress Notes (Signed)
[facility]  ROUNDING NOTE   Subjective:   Shawn [name] is a 66 y.o. AA male with past medical conditions of CVA, diabetes, CHF, hypertension, and chronic kidney disease Iib. Patient presented to ED with lower extremity swelling and scrotal. Patient is admitted for Anasarca [R60.1] Testicle swelling [N50.89] Acute on chronic congestive heart failure, unspecified heart failure type [facility]) [I50.9]  Patient is known to our clinic and is followed by Dr Candiss Norse.   Update: Patient seen sitting up in bed, alert and oriented Preparing for breakfast, denies nausea and vomiting Remains on room air Lower extremity edema remains however improved.  Furosemide drip 8 mg/hr UOP 2.6L   Objective:  Vital signs in last 24 hours:  Temp:  [97.8 F (36.6 C)-98.9 F (37.2 C)] 98.9 F (37.2 C) (06/28 1000) Pulse Rate:  [80-85] 85 (06/28 1000) Resp:  [12-19] 19 (06/28 1000) BP: (113-151)/(70-85) 147/81 (06/28 1000) SpO2:  [96 %-100 %] 97 % (06/28 1000)  Weight change:  Filed Weights   06/28/21 0525 06/29/21 1509 06/30/21 0500  Weight: 75.5 kg 75.7 kg 76.6 kg    Intake/Output: I/O last 3 completed shifts: In: 898.8 [P.O.:450; I.V.:448.8] Out: 3270 [Urine:3270]   Intake/Output this shift:  Total I/O In: 67.6 [I.V.:67.6] Out: -   Physical Exam: General: NAD  Head: Normocephalic, atraumatic. Moist oral mucosal membranes  Eyes: Anicteric  Lungs:  Clear to auscultation, normal effort  Heart: Regular rate and rhythm  Abdomen:  Soft, nontender  Extremities:  1+ peripheral edema.  Neurologic: Nonfocal, moving all four extremities  Skin: No lesions  Access: None  GU-scrotal edema (improving)  Basic Metabolic Panel: Recent Labs  Lab 06/27/21 0701 06/28/21 0425 06/29/21 0418 06/30/21 0447 07/01/21 0451  NA 136 132* 135 136 136  K 4.0 4.8 4.3 4.1 4.0  CL 106 105 104 106 104  CO2 25 22 22 24 23   GLUCOSE 94 187* 171* 265* 115*  BUN 43* 49* 54* 55* 55*  CREATININE  3.30* 4.12* 4.26* 3.98* 3.93*  CALCIUM 8.5* 8.6* 8.6* 8.3* 8.5*  MG 1.9 2.0 2.0 2.0 1.7     Liver Function Tests: No results for input(s): "AST", "ALT", "ALKPHOS", "BILITOT", "PROT", "ALBUMIN" in the last 168 hours.  No results for input(s): "LIPASE", "AMYLASE" in the last 168 hours. No results for input(s): "AMMONIA" in the last 168 hours.  CBC: Recent Labs  Lab 06/27/21 0701 06/28/21 0425 06/29/21 0418 06/30/21 0447 07/01/21 0451  WBC 4.9 5.8 5.3 5.2 6.3  HGB 9.1* 10.1* 9.3* 8.9* 8.6*  HCT 28.8* 32.0* 29.6* 27.7* 26.9*  MCV 89.2 91.7 90.2 90.5 89.7  PLT 336 343 360 331 344     Cardiac Enzymes: No results for input(s): "CKTOTAL", "CKMB", "CKMBINDEX", "TROPONINI" in the last 168 hours.  BNP: Invalid input(s): "POCBNP"  CBG: Recent Labs  Lab 06/30/21 0730 06/30/21 1125 06/30/21 1620 06/30/21 2108 07/01/21 0812  GLUCAP 238* 238* 218* 195* 116*     Microbiology: Results for orders placed or performed during the hospital encounter of 06/23/21  Urine Culture     Status: Abnormal   Collection Time: 06/23/21 11:10 AM   Specimen: Urine, Clean Catch  Result Value Ref Range Status   Specimen Description   Final    URINE, CLEAN CATCH Performed at [facility], [address] [place], [place] [address]    Special Requests   Final    NONE Performed at [facility], [address] [place], [place] [address]    Culture  MULTIPLE SPECIES PRESENT, SUGGEST RECOLLECTION (A)  Final   Report Status 06/24/2021 FINAL  Final  Body fluid culture w Gram Stain     Status: None   Collection Time: 06/25/21 11:29 AM   Specimen: Peritoneal Washings  Result Value Ref Range Status   Specimen Description   Final    PERITONEAL Performed at [facility], [address], [place], [place] [address]    Special Requests   Final    NONE Performed at [facility], [place], [place] [address]    Gram Stain NO WBC SEEN NO  ORGANISMS SEEN   Final   Culture   Final    NO GROWTH 3 DAYS Performed at [facility], [place] [address], [place], [place] [address]    Report Status 06/28/2021 FINAL  Final  MRSA Next Gen by PCR, Nasal     Status: None   Collection Time: 06/29/21  3:12 PM   Specimen: Nasal Mucosa; Nasal Swab  Result Value Ref Range Status   MRSA by PCR Next Gen NOT DETECTED NOT DETECTED Final    Comment: (NOTE) The GeneXpert MRSA Assay (FDA approved for NASAL specimens only), is one component of a comprehensive MRSA colonization surveillance program. It is not intended to diagnose MRSA infection nor to guide or monitor treatment for MRSA infections. Test performance is not FDA approved in patients less than 58 years old. Performed at [facility], [address] [place], [place] [address]     Coagulation Studies: No results for input(s): "LABPROT", "INR" in the last 72 hours.  Urinalysis: No results for input(s): "COLORURINE", "LABSPEC", "PHURINE", "GLUCOSEU", "HGBUR", "BILIRUBINUR", "KETONESUR", "PROTEINUR", "UROBILINOGEN", "NITRITE", "LEUKOCYTESUR" in the last 72 hours.  Invalid input(s): "APPERANCEUR"    Imaging: DG Chest Port 1 View  Result Date: 06/29/2021 CLINICAL DATA:  Central venous catheter placement EXAM: PORTABLE CHEST 1 VIEW COMPARISON:  06/28/2021 FINDINGS: Right internal jugular central venous catheter tip: SVC. No visible pneumothorax. Increased hazy density in both lungs with obscuration of both hemidiaphragms and accentuated interstitial accentuation. Mild enlargement of the cardiopericardial silhouette. IMPRESSION: 1. Right IJ line tip: SVC.  No pneumothorax. 2. Increased hazy opacity and interstitial accentuation in both lungs potentially from pulmonary edema with pleural effusions or atelectasis. Electronically Signed   By: Van Clines M.D.   On: 06/29/2021 12:30     Medications:    sodium chloride Stopped (06/30/21 1423)   furosemide (LASIX) 200 mg  in dextrose 5 % 100 mL (2 mg/mL) infusion 8 mg/hr (07/01/21 1100)   magnesium sulfate bolus IVPB     milrinone 0.25 mcg/kg/min (07/01/21 1100)    aspirin  81 mg Oral Daily   carvedilol  3.125 mg Oral BID WC   Chlorhexidine Gluconate Cloth  6 each Topical Daily   heparin injection (subcutaneous)  5,000 Units Subcutaneous Q8H   hydrALAZINE  25 mg Oral Q8H   insulin aspart  0-15 Units Subcutaneous TID WC   insulin aspart  2 Units Subcutaneous TID WC   insulin glargine-yfgn  12 Units Subcutaneous QHS   isosorbide dinitrate  10 mg Oral Q8H   nicotine  21 mg Transdermal Daily   sodium chloride flush  3 mL Intravenous Q12H   sodium chloride, acetaminophen, docusate sodium, polyethylene glycol, sodium chloride flush, traMADol  Assessment/ Plan:  Shawn [name] is a 66 y.o.  male with past medical conditions of CVA, diabetes, CHF, hypertension, and chronic kidney disease IIib. Patient presented to ED with  lower extremity swelling and scrotal. Patient is admitted for Anasarca [R60.1] Testicle swelling [N50.89] Acute on chronic congestive heart failure, unspecified heart failure type ([place]) [I50.9]   Acute Kidney Injury on chronic kidney disease stage IIIb with baseline creatinine 1.27 06/23/21.  Acute kidney injury likely secondary to IV contrast exposure and fluid shifts.  Renal function relatively unchanged from yesterday.  Adequate urine output still recorded.  We will continue to monitor renal function during diuresis.  Lab Results  Component Value Date   CREATININE 3.93 (H) 07/01/2021   CREATININE 3.98 (H) 06/30/2021   CREATININE 4.26 (H) 06/29/2021    Intake/Output Summary (Last 24 hours) at 07/01/2021 1200 Last data filed at 07/01/2021 1100 Gross per 24 hour  Intake 423.99 ml  Output 2340 ml  Net -1916.01 ml    2. Hypertension with chronic kidney disease.  Home regimen includes Carvedilol and entresto. Entresto currently held.   Remains on Furosemide drip, increased rate  by Cardiology. Blood pressure 147/81   3. 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 Home regimen includes NovoLog and glargine-yfgn.  Most recent hemoglobin A1c is 11.9 on 06/24/21.  Primary team to manage scale insulin   4.Anemia-Iron deficiency anemia   Latest Reference Range & Units 06/27/21 07:01  Iron 45 - 182 ug/dL 24 (L)  UIBC ug/dL 210  TIBC 250 - 450 ug/dL 234 (L)  Saturation Ratios 17.9 - 39.5 % 10 (L)  Ferritin 24 - 336 ng/mL 59  Folate >5.9 ng/mL 25.0  (L): Data is abnormally low  Hgb 8.6, below target. Will continue to monitor for now. May consider subcutaneous EPO.   5. Acute urinary retention Foley catheter placed with adequate urine output  6.  Acute on chronic systolic heart failure. ECHO on 06/26/21 shows EF 25-30% global hypokinesis, grade 2 diastolic dysfunction, moderate mitral regurgitation, and small pericardial effusion.  Remains on furosemide drip.  Right heart cath on 06/29/21, found to have elevated left heart filling pressure and moderately to severely elevated right heart and pulmonary artery pressures.  Patient remains on Milrinone with cardiology following    LOS: 7 [place] 6/28/202312:00 PM

## 2021-07-01 NOTE — Assessment & Plan Note (Signed)
Worsening of creatinine with diuresis, also received IV contrast.  Concern of cardiorenal syndrome.  Baseline around 1.27, peaked at 4.26 and now with slight improvement. Nephrology is on board. -Continue to monitor -Avoid nephrotoxins -Continue to hold Entresto -Strict intake and output

## 2021-07-01 NOTE — Progress Notes (Signed)
Progress Note   Patient: Shawn [name] DOB: [birth year] DOA: 06/23/2021     7 DOS: the patient was seen and examined on 07/01/2021   Brief hospital course: Taken from prior notes.  Shawn [name] is a 66 y.o. male with medical history significant of poorly controlled t2dm, ckd 3b, combined CHF, smoking, alcohol abuse, hypertension, prior CVA, malnutrition who presented scrotal and leg swelling.  Home torsemide and Aldactone were held after hospitalization in May 2023 for DKA and AKI, and not resumed due to no PCP followup (per pt, PCP wanted him to find another doctor). --on current presentation, BNP 2973, imaging studies showed pulm edema, ascites, scrotal edema.  Also has BLE and pedal edema.  Swelling was severe and limiting his movements.  Also has small bilateral pleural effusions.  home torsemide and Aldactone were held after hospitalization in May 2023 for DKA and AKI, and not resumed due to no PCP followup.  Current Echo showed LVEF had worsened from 40-45% to 25-30% in just 5 months.  Cardiology consulted.  Right heart cath on 6/26, found to have Moderately to severely elevated right heart and pulmonary artery pressures.  Due to lack of response to regular diuresis, patient was transferred to stepdown and started on  milrinone and Lasix infusion.  6/28: Started having good diuresis, total of 2615 mL of her output recorded.  Renal function seems stable, creatinine at 3.93 today.  Cardiology is planning to continue milrinone and Lasix infusion at this time.  Clinically improving.   Assessment and Plan: * Acute on chronic HFrEF (heart failure with reduced ejection fraction) ([place]) Patient admitted for concern of acute on chronic HFrEF, repeat echocardiogram with worsening of EF to 25 to 30%.  Cardiology is on board. Significant elevated BNP at 2973. Patient was started on milrinone and Lasix infusion with improvement in diuresis and edema. -Continue milrinone  and Lasix infusion -Continue with daily BMP and weight  Anasarca Patient was started on milrinone and Lasix infusion and started getting some diuresis.  Renal function seems stable but remained elevated than baseline. Nephrology and cardiology on board.  Output and edema improving. -Continue to monitor   Acute kidney injury superimposed on CKD 3b ([name]) Worsening of creatinine with diuresis, also received IV contrast.  Concern of cardiorenal syndrome.  Baseline around 1.27, peaked at 4.26 and now with slight improvement. Nephrology is on board. -Continue to monitor -Avoid nephrotoxins -Continue to hold Entresto -Strict intake and output  Type 2 diabetes mellitus with stage 3b chronic kidney disease, with long-term current use of insulin ([name]) Apparently patient had an history of hypoglycemia and discharge home without any insulin during prior hospitalization at [facility].  Then went to [facility] and found to have hyperglycemia and started on 20 units of glargine daily. Currently CBG within goal. -Continue glargine at 12 units at night -SSI  Essential hypertension Blood pressure mildly elevated. -Continue Coreg. -Keep holding Entresto due to AKI -Continue Lasix infusion -She was started on hydralazine, and Isordil by cardiology  Iron deficiency anemia --s/p IV iron --plan to discharge on oral iron supplement.    Subjective: Patient was seen and examined today.  Seems improving and no new complaints.  Physical Exam: Vitals:   07/01/21 1000 07/01/21 1200 07/01/21 1600 07/01/21 1800  BP: (!) 147/81 (!) 143/81 (!) 152/86 (!) 154/80  Pulse: 85 82 81 83  Resp: 19 17 15 18   Temp: 98.9 F (37.2 C)  98.6 F (37 C)   TempSrc: Oral  Oral  SpO2: 97% 98% 98% 98%  Weight:      Height:       General.     In no acute distress. Pulmonary.  Lungs clear bilaterally, normal respiratory effort. CV.  Regular rate and rhythm, no JVD, rub or murmur. Abdomen.  Soft, nontender, nondistended, BS  positive. CNS.  Alert and oriented .  No focal neurologic deficit. Extremities.  Trace LE edema, no cyanosis, pulses intact and symmetrical. Psychiatry.  Judgment and insight appears normal.  Data Reviewed: Prior data reviewed  Family Communication:   Disposition: Status is: Inpatient Remains inpatient appropriate because: Severity of illness   Planned Discharge Destination: Home with Home Health  DVT prophylaxis.  Subcu heparin Time spent: 50 minutes  This record has been created using Systems analyst. Errors have been sought and corrected,but may not always be located. Such creation errors do not reflect on the standard of care.  Author: Lorella Nimrod, MD 07/01/2021 6:48 PM  For on call review [email or web address].

## 2021-07-01 NOTE — Progress Notes (Signed)
Progress Note  Patient Name: Shawn [name] Date of Encounter: 07/01/2021  [facility] Cardiologist: Ida Rogue, MD   Subjective   Eating breakfast, denies chest pain or shortness of breath, states feeling okay on current medicines.  Net -1.8 L over the past 24 hours.  Inpatient Medications    Scheduled Meds:  aspirin  81 mg Oral Daily   carvedilol  3.125 mg Oral BID WC   Chlorhexidine Gluconate Cloth  6 each Topical Daily   heparin injection (subcutaneous)  5,000 Units Subcutaneous Q8H   hydrALAZINE  25 mg Oral Q8H   insulin aspart  0-15 Units Subcutaneous TID WC   insulin aspart  2 Units Subcutaneous TID WC   insulin glargine-yfgn  12 Units Subcutaneous QHS   isosorbide dinitrate  10 mg Oral Q8H   nicotine  21 mg Transdermal Daily   sodium chloride flush  3 mL Intravenous Q12H   Continuous Infusions:  sodium chloride Stopped (06/30/21 1423)   furosemide (LASIX) 200 mg in dextrose 5 % 100 mL (2 mg/mL) infusion 8 mg/hr (07/01/21 0400)   magnesium sulfate bolus IVPB     milrinone 0.25 mcg/kg/min (07/01/21 0400)   PRN Meds: sodium chloride, acetaminophen, docusate sodium, polyethylene glycol, sodium chloride flush, traMADol   Vital Signs    Vitals:   06/30/21 2200 07/01/21 0000 07/01/21 0200 07/01/21 0400  BP: (!) 151/82 (!) 150/84 (!) 151/80 (!) 142/85  Pulse: 81 80 81 83  Resp: 15 13 12 13   Temp:    97.8 F (36.6 C)  TempSrc:    Oral  SpO2:  99%  100%  Weight:      Height:        Intake/Output Summary (Last 24 hours) at 07/01/2021 1127 Last data filed at 07/01/2021 0600 Gross per 24 hour  Intake 556.4 ml  Output 2340 ml  Net -1783.6 ml      06/30/2021    5:00 AM 06/29/2021    3:09 PM 06/28/2021    5:25 AM  Last 3 Weights  Weight (lbs) 168 lb 14 oz 166 lb 14.2 oz 166 lb 8 oz  Weight (kg) 76.6 kg 75.7 kg 75.524 kg      Telemetry    Sinus rhythm, heart rate 85- Personally Reviewed  ECG     - Personally Reviewed  Physical Exam   GEN:  No acute distress.   Neck: No JVD Cardiac: RRR, no murmurs, rubs, or gallops.  Respiratory: Poor inspiratory effort GI: Soft, nontender, non-distended  MS: 2+ edema; No deformity. Neuro:  Nonfocal  Psych: Normal affect   Labs    High Sensitivity Troponin:   Recent Labs  Lab 06/23/21 1110 06/23/21 1620  TROPONINIHS 68* 60*     Chemistry Recent Labs  Lab 06/29/21 0418 06/30/21 0447 07/01/21 0451  NA 135 136 136  K 4.3 4.1 4.0  CL 104 106 104  CO2 22 24 23   GLUCOSE 171* 265* 115*  BUN 54* 55* 55*  CREATININE 4.26* 3.98* 3.93*  CALCIUM 8.6* 8.3* 8.5*  MG 2.0 2.0 1.7  GFRNONAA 15* 16* 16*  ANIONGAP 9 6 9     Lipids No results for input(s): "CHOL", "TRIG", "HDL", "LABVLDL", "LDLCALC", "CHOLHDL" in the last 168 hours.  Hematology Recent Labs  Lab 06/29/21 0418 06/30/21 0447 07/01/21 0451  WBC 5.3 5.2 6.3  RBC 3.28* 3.06* 3.00*  HGB 9.3* 8.9* 8.6*  HCT 29.6* 27.7* 26.9*  MCV 90.2 90.5 89.7  MCH 28.4 29.1 28.7  MCHC 31.4 32.1  32.0  RDW 14.7 14.6 14.8  PLT 360 331 344   Thyroid No results for input(s): "TSH", "FREET4" in the last 168 hours.  BNPNo results for input(s): "BNP", "PROBNP" in the last 168 hours.  DDimer No results for input(s): "DDIMER" in the last 168 hours.   Radiology    CARDIAC CATHETERIZATION  Result Date: 06/29/2021 Conclusions: Moderately elevated left heart filling pressure (PCWP 28 mmHg). Moderately to severely elevated right heart and pulmonary artery pressures (mean RA 13 mmHg, RVEDP 15 mmHg, mean PAP 41 mmHg). Normal Fick cardiac output (Fick CO 4.5 L/min, CI 2.7 L/min/m^2) with low central venous oxygen saturation (SvO2 51%). Successful placement of 39F triple lumen catheter via the right internal jugular vein. Recommendations: Transfer to stepdown for initiation of milrinone. Continue furosemide infusion; dose escalation will likely be needed based on response to milrinone. Monitor CVP every 4 hours and daily central venous oxygen  saturations. Nelva Bush, MD [facility]  DG Chest Port 1 View  Result Date: 06/29/2021 CLINICAL DATA:  Central venous catheter placement EXAM: PORTABLE CHEST 1 VIEW COMPARISON:  06/28/2021 FINDINGS: Right internal jugular central venous catheter tip: SVC. No visible pneumothorax. Increased hazy density in both lungs with obscuration of both hemidiaphragms and accentuated interstitial accentuation. Mild enlargement of the cardiopericardial silhouette. IMPRESSION: 1. Right IJ line tip: SVC.  No pneumothorax. 2. Increased hazy opacity and interstitial accentuation in both lungs potentially from pulmonary edema with pleural effusions or atelectasis. Electronically Signed   By: Van Clines M.D.   On: 06/29/2021 12:30    Cardiac Studies   Echo 06/26/2021 1. Left ventricular ejection fraction, by estimation, is 25 to 30%. The  left ventricle has severely decreased function. The left ventricle  demonstrates global hypokinesis. The left ventricular internal cavity size  was mildly dilated. Left ventricular  diastolic parameters are consistent with Grade II diastolic dysfunction  (pseudonormalization). The average left ventricular global longitudinal  strain is 9.0 %. The global longitudinal strain is abnormal.   2. Right ventricular systolic function is normal. The right ventricular  size is normal. Tricuspid regurgitation signal is inadequate for assessing  PA pressure.    Patient Profile     66 y.o. male with history of nonischemic cardiomyopathy EF 25 to 30%, CKD, hypertension, diabetes, CVA presenting with worsening shortness of breath and edema, being seen for acute heart failure.  Assessment & Plan    NICM EF 25% -Still volume overloaded, 2+ edema -Continue milrinone, Lasix drip. -Net -1.8 L over the past 24 hours, creatinine stable. -Continue Coreg, Isordil, hydralazine  2.  CKD -Monitor creatinine with diuresis -Avoid nephrotoxic's  Total encounter time more than 50  minutes  Greater than 50% was spent in counseling and coordination of care with the patient     Signed, Kate Sable, MD  07/01/2021, 11:27 AM

## 2021-07-01 NOTE — Hospital Course (Addendum)
Taken from prior notes.  Shawn [name] is a 66 y.o. male with medical history significant of poorly controlled t2dm, ckd 3b, combined CHF, smoking, alcohol abuse, hypertension, prior CVA, malnutrition who presented scrotal and leg swelling.  Home torsemide and Aldactone were held after hospitalization in May 2023 for DKA and AKI, and not resumed due to no PCP followup (per pt, PCP wanted him to find another doctor). --on current presentation, BNP 2973, imaging studies showed pulm edema, ascites, scrotal edema.  Also has BLE and pedal edema.  Swelling was severe and limiting his movements.  Also has small bilateral pleural effusions.  home torsemide and Aldactone were held after hospitalization in May 2023 for DKA and AKI, and not resumed due to no PCP followup.  Current Echo showed LVEF had worsened from 40-45% to 25-30% in just 5 months.  Cardiology consulted.  Right heart cath on 6/26, found to have Moderately to severely elevated right heart and pulmonary artery pressures.  Due to lack of response to regular diuresis, patient was transferred to stepdown and started on  milrinone and Lasix infusion.  6/28: Started having good diuresis, total of 2615 mL of her output recorded.  Renal function seems stable, creatinine at 3.93 today.  Cardiology is planning to continue milrinone and Lasix infusion at this time.  Clinically improving.  6/29: Patient remained stable, mildly elevated blood pressure, milrinone drip rate was decreased.  Cardiology would like to continue Lasix infusion.  Net 2.4 L over the past 24 hours.  Creatinine with some improvement.  6/30: Patient continued to improve.  Improving renal function.  Milrinone was discontinued and plan is to continue heparin infusion for now.  Hydralazine and isosorbide dinitrate dose was increased due to persistently elevated blood pressure. Cardiology and nephrology is on board.  7/1: Continue to have good diuresis after discontinuing  milrinone.  Total output of 3225 in the past 24 hours with total net of -8526. Significant hyperglycemia with CBG remained about 300-400.  Increasing mealtime coverage to 6 unit and changing Semglee to 10 units twice daily.  Renal function stable. We will remove Foley catheter today to give him a voiding trial.  Continue to have strict intake and output.  Cardiology is going to continue with Lasix infusion for now  7/2: Patient remained stable and remained on Lasix infusion.  Renal functions with slight improvement.  Good urinary output, net negative of 11,000.  Cardiology would like to continue Lasix infusion for another day or so.  7/3: Renal function seems overall stable with slight variation.  Continue to have good urinary output with net negative of above 13 L.  Lasix infusion will be discontinued later today and he will be started on p.o. from tomorrow. Cardiology to decide about ischemic work-up as an outpatient after improvement of renal function. No ACE inhibitor or ARB's due to acute on chronic kidney disease per cardiology.  7/4: Patient remained stable.  Renal functions stable with GFR of 21.  He was transitioned off from Lasix infusion, now on p.o. Lasix 40 mg twice daily. Patient will continue holding his home Entresto, Aldactone and torsemide and his out patient providers can restart when appropriate.  Patient is being discharged on current medications and need to have a close follow-up with cardiology and nephrology for further recommendations.

## 2021-07-01 NOTE — Assessment & Plan Note (Signed)
Apparently patient had an history of hypoglycemia and discharge home without any insulin during prior hospitalization at [facility].  Then went to [facility] and found to have hyperglycemia and started on 20 units of glargine daily. Currently CBG within goal. -Continue glargine at 12 units at night -SSI

## 2021-07-01 NOTE — Assessment & Plan Note (Signed)
Patient admitted for concern of acute on chronic HFrEF, repeat echocardiogram with worsening of EF to 25 to 30%.  Cardiology is on board. Significant elevated BNP at 2973. Patient was started on milrinone and Lasix infusion with improvement in diuresis and edema. -Continue milrinone and Lasix infusion -Continue with daily BMP and weight

## 2021-07-01 NOTE — Assessment & Plan Note (Signed)
Blood pressure mildly elevated. -Continue Coreg. -Keep holding Entresto due to AKI -Continue Lasix infusion -She was started on hydralazine, and Isordil by cardiology

## 2021-07-02 DIAGNOSIS — I5023 Acute on chronic systolic (congestive) heart failure: Secondary | ICD-10-CM | POA: Diagnosis not present

## 2021-07-02 LAB — BASIC METABOLIC PANEL
Anion gap: 9 (ref 5–15)
BUN: 53 mg/dL — ABNORMAL HIGH (ref 8–23)
CO2: 26 mmol/L (ref 22–32)
Calcium: 8.4 mg/dL — ABNORMAL LOW (ref 8.9–10.3)
Chloride: 104 mmol/L (ref 98–111)
Creatinine, Ser: 3.75 mg/dL — ABNORMAL HIGH (ref 0.61–1.24)
GFR, Estimated: 17 mL/min — ABNORMAL LOW (ref >60)
Glucose, Bld: 195 mg/dL — ABNORMAL HIGH (ref 70–99)
Potassium: 4 mmol/L (ref 3.5–5.1)
Sodium: 139 mmol/L (ref 135–145)

## 2021-07-02 LAB — CBC
HCT: 26.3 % — ABNORMAL LOW (ref 39.0–52.0)
Hemoglobin: 8.5 g/dL — ABNORMAL LOW (ref 13.0–17.0)
MCH: 29.2 pg (ref 26.0–34.0)
MCHC: 32.3 g/dL (ref 30.0–36.0)
MCV: 90.4 fL (ref 80.0–100.0)
Platelets: 378 10*3/uL (ref 150–400)
RBC: 2.91 MIL/uL — ABNORMAL LOW (ref 4.22–5.81)
RDW: 14.8 % (ref 11.5–15.5)
WBC: 6.1 10*3/uL (ref 4.0–10.5)
nRBC: 0 % (ref 0.0–0.2)

## 2021-07-02 LAB — MAGNESIUM: Magnesium: 2.3 mg/dL (ref 1.7–2.4)

## 2021-07-02 LAB — GLUCOSE, CAPILLARY
Glucose-Capillary: 196 mg/dL — ABNORMAL HIGH (ref 70–99)
Glucose-Capillary: 207 mg/dL — ABNORMAL HIGH (ref 70–99)
Glucose-Capillary: 213 mg/dL — ABNORMAL HIGH (ref 70–99)

## 2021-07-02 LAB — COOXEMETRY PANEL
Carboxyhemoglobin: 1.3 % (ref 0.5–1.5)
Methemoglobin: 1 % (ref 0.0–1.5)
O2 Saturation: 71.8 %
Total hemoglobin: 16.5 g/dL — ABNORMAL HIGH (ref 12.0–16.0)
Total oxygen content: 70.1 %

## 2021-07-02 MED ORDER — SIMETHICONE 80 MG PO CHEW
160.0000 mg | CHEWABLE_TABLET | Freq: Once | ORAL | Status: AC
  Administered 2021-07-02: 160 mg via ORAL
  Filled 2021-07-02: qty 2

## 2021-07-02 NOTE — Progress Notes (Signed)
[facility]  ROUNDING NOTE   Subjective:   Shawn [name] is a 66 y.o. AA male with past medical conditions of CVA, diabetes, CHF, hypertension, and chronic kidney disease Iib. Patient presented to ED with lower extremity swelling and scrotal. Patient is admitted for Anasarca [R60.1] Testicle swelling [N50.89] Acute on chronic congestive heart failure, unspecified heart failure type [facility]) [I50.9]  Patient is known to our clinic and is followed by Dr Candiss Norse.   Update: Patient resting quietly Remains on room air  Furosemide drip 8 mg/hr UOP 2.1L   Objective:  Vital signs in last 24 hours:  Temp:  [98.2 F (36.8 C)-98.6 F (37 C)] 98.3 F (36.8 C) (06/29 0600) Pulse Rate:  [81-86] 83 (06/29 0800) Resp:  [13-18] 13 (06/29 0800) BP: (135-165)/(76-90) 135/76 (06/29 0800) SpO2:  [92 %-98 %] 92 % (06/29 0600) Weight:  [72.9 kg] 72.9 kg (06/29 0500)  Weight change:  Filed Weights   06/29/21 1509 06/30/21 0500 07/02/21 0500  Weight: 75.7 kg 76.6 kg 72.9 kg    Intake/Output: I/O last 3 completed shifts: In: 983.8 [P.O.:580; I.V.:359.5; IV Piggyback:44.3] Out: 5280 [Urine:5280]   Intake/Output this shift:  Total I/O In: 96.7 [P.O.:50; I.V.:46.7] Out: 500 [Urine:500]  Physical Exam: General: NAD  Head: Normocephalic, atraumatic. Moist oral mucosal membranes  Eyes: Anicteric  Lungs:  Clear to auscultation, normal effort  Heart: Regular rate and rhythm  Abdomen:  Soft, nontender  Extremities:  1+ peripheral edema.  Neurologic: Nonfocal, moving all four extremities  Skin: No lesions  Access: None  GU-scrotal edema (improving)  Basic Metabolic Panel: Recent Labs  Lab 06/28/21 0425 06/29/21 0418 06/30/21 0447 07/01/21 0451 07/02/21 0513  NA 132* 135 136 136 139  K 4.8 4.3 4.1 4.0 4.0  CL 105 104 106 104 104  CO2 22 22 24 23 26   GLUCOSE 187* 171* 265* 115* 195*  BUN 49* 54* 55* 55* 53*  CREATININE 4.12* 4.26* 3.98* 3.93* 3.75*  CALCIUM 8.6*  8.6* 8.3* 8.5* 8.4*  MG 2.0 2.0 2.0 1.7 2.3     Liver Function Tests: No results for input(s): "AST", "ALT", "ALKPHOS", "BILITOT", "PROT", "ALBUMIN" in the last 168 hours.  No results for input(s): "LIPASE", "AMYLASE" in the last 168 hours. No results for input(s): "AMMONIA" in the last 168 hours.  CBC: Recent Labs  Lab 06/28/21 0425 06/29/21 0418 06/30/21 0447 07/01/21 0451 07/02/21 0513  WBC 5.8 5.3 5.2 6.3 6.1  HGB 10.1* 9.3* 8.9* 8.6* 8.5*  HCT 32.0* 29.6* 27.7* 26.9* 26.3*  MCV 91.7 90.2 90.5 89.7 90.4  PLT 343 360 331 344 378     Cardiac Enzymes: No results for input(s): "CKTOTAL", "CKMB", "CKMBINDEX", "TROPONINI" in the last 168 hours.  BNP: Invalid input(s): "POCBNP"  CBG: Recent Labs  Lab 07/01/21 0812 07/01/21 1158 07/01/21 1652 07/01/21 2118 07/02/21 0720  GLUCAP 116* 143* 197* 238* 196*     Microbiology: Results for orders placed or performed during the hospital encounter of 06/23/21  Urine Culture     Status: Abnormal   Collection Time: 06/23/21 11:10 AM   Specimen: Urine, Clean Catch  Result Value Ref Range Status   Specimen Description   Final    URINE, CLEAN CATCH Performed at [facility], [address] [place], [place] [address]    Special Requests   Final    NONE Performed at [facility], [address], [place], [place] [address]    Culture MULTIPLE SPECIES PRESENT, SUGGEST RECOLLECTION (A)  Final  Report Status 06/24/2021 FINAL  Final  Body fluid culture w Gram Stain     Status: None   Collection Time: 06/25/21 11:29 AM   Specimen: Peritoneal Washings  Result Value Ref Range Status   Specimen Description   Final    PERITONEAL Performed at [facility], [address] [place], [place] [address]    Special Requests   Final    NONE Performed at [facility], [place], [place] [address]    Gram Stain NO WBC SEEN NO ORGANISMS SEEN   Final   Culture   Final    NO  GROWTH 3 DAYS Performed at [facility], [place] [address] [place], [place] [address]    Report Status 06/28/2021 FINAL  Final  MRSA Next Gen by PCR, Nasal     Status: None   Collection Time: 06/29/21  3:12 PM   Specimen: Nasal Mucosa; Nasal Swab  Result Value Ref Range Status   MRSA by PCR Next Gen NOT DETECTED NOT DETECTED Final    Comment: (NOTE) The GeneXpert MRSA Assay (FDA approved for NASAL specimens only), is one component of a comprehensive MRSA colonization surveillance program. It is not intended to diagnose MRSA infection nor to guide or monitor treatment for MRSA infections. Test performance is not FDA approved in patients less than 71 years old. Performed at [facility], [address] [place], [place] [address]     Coagulation Studies: No results for input(s): "LABPROT", "INR" in the last 72 hours.  Urinalysis: No results for input(s): "COLORURINE", "LABSPEC", "PHURINE", "GLUCOSEU", "HGBUR", "BILIRUBINUR", "KETONESUR", "PROTEINUR", "UROBILINOGEN", "NITRITE", "LEUKOCYTESUR" in the last 72 hours.  Invalid input(s): "APPERANCEUR"    Imaging: No results found.   Medications:    sodium chloride 6 mL/hr at 07/02/21 0859   furosemide (LASIX) 200 mg in dextrose 5 % 100 mL (2 mg/mL) infusion 8 mg/hr (07/02/21 0859)   milrinone 0.25 mcg/kg/min (07/02/21 0859)    aspirin  81 mg Oral Daily   carvedilol  3.125 mg Oral BID WC   Chlorhexidine Gluconate Cloth  6 each Topical Daily   heparin injection (subcutaneous)  5,000 Units Subcutaneous Q8H   hydrALAZINE  25 mg Oral Q8H   insulin aspart  0-15 Units Subcutaneous TID WC   insulin aspart  2 Units Subcutaneous TID WC   insulin glargine-yfgn  12 Units Subcutaneous QHS   isosorbide dinitrate  10 mg Oral Q8H   nicotine  21 mg Transdermal Daily   sodium chloride flush  3 mL Intravenous Q12H   sodium chloride, acetaminophen, docusate sodium, polyethylene glycol, sodium chloride flush,  traMADol  Assessment/ Plan:  [name] is a 66 y.o.  male with past medical conditions of CVA, diabetes, CHF, hypertension, and chronic kidney disease IIib. Patient presented to ED with lower extremity swelling and scrotal. Patient is admitted for Anasarca [R60.1] Testicle swelling [N50.89] Acute on chronic congestive heart failure, unspecified heart failure type ([place]) [I50.9]   Acute Kidney Injury on chronic kidney disease stage IIIb with baseline creatinine 1.27 06/23/21.  Acute kidney injury likely secondary to IV contrast exposure and fluid shifts.  Renal function stable on furosemide drip. Urine output acceptable. Will continue to monitor  Lab Results  Component Value Date   CREATININE 3.75 (H) 07/02/2021   CREATININE 3.93 (H) 07/01/2021   CREATININE 3.98 (H) 06/30/2021    Intake/Output Summary (Last 24 hours) at 07/02/2021 1043 Last data filed at 07/02/2021 0859 Gross per 24 hour  Intake 743.91 ml  Output 3840 ml  Net -3096.09 ml    2. Hypertension with chronic kidney disease.  Home regimen includes Carvedilol and entresto. Entresto currently held.   Remains on Furosemide drip, followed by Cardiology. Blood pressure 135/76   3. 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 Home regimen includes NovoLog and glargine-yfgn.  Most recent hemoglobin A1c is 11.9 on 06/24/21.    4.Anemia-Iron deficiency anemia   Latest Reference Range & Units 06/27/21 07:01  Iron 45 - 182 ug/dL 24 (L)  UIBC ug/dL 210  TIBC 250 - 450 ug/dL 234 (L)  Saturation Ratios 17.9 - 39.5 % 10 (L)  Ferritin 24 - 336 ng/mL 59  Folate >5.9 ng/mL 25.0  (L): Data is abnormally low  Hgb 8.5, below target. Will continue to monitor for now. May consider subcutaneous EPO.   5. Acute urinary retention Foley catheter placed  6.  Acute on chronic systolic heart failure. ECHO on 06/26/21 shows EF 25-30% global hypokinesis, grade 2 diastolic dysfunction, moderate mitral  regurgitation, and small pericardial effusion.  Remains on furosemide drip.  Right heart cath on 06/29/21, found to have elevated left heart filling pressure and moderately to severely elevated right heart and pulmonary artery pressures.  Patient remains on Milrinone with cardiology following    LOS: 8 [place] 6/29/202310:43 AM

## 2021-07-02 NOTE — Assessment & Plan Note (Signed)
Blood pressure mildly elevated. -Continue Coreg. -Keep holding Entresto due to AKI -Continue Lasix infusion -She was started on hydralazine, and Isordil by cardiology -Milrinone drip rate was decreased today

## 2021-07-02 NOTE — Assessment & Plan Note (Signed)
Patient admitted for concern of acute on chronic HFrEF, repeat echocardiogram with worsening of EF to 25 to 30%.  Cardiology is on board. Significant elevated BNP at 2973. Patient was started on milrinone and Lasix infusion with improvement in diuresis and edema. -Continue milrinone, rate was decreased today with a plan to stop tomorrow  -Continue Lasix infusion -Continue with daily BMP and weight -Strict intake and output

## 2021-07-02 NOTE — Progress Notes (Signed)
Progress Note  Patient Name: Shawn [name] Date of Encounter: 07/02/2021  [facility] Cardiologist: Ida Rogue, MD   Subjective   No acute events overnight, denies chest pain or shortness of breath..  Net -2.4 L over the past 24 hours.  Inpatient Medications    Scheduled Meds:  aspirin  81 mg Oral Daily   carvedilol  3.125 mg Oral BID WC   Chlorhexidine Gluconate Cloth  6 each Topical Daily   heparin injection (subcutaneous)  5,000 Units Subcutaneous Q8H   hydrALAZINE  25 mg Oral Q8H   insulin aspart  0-15 Units Subcutaneous TID WC   insulin aspart  2 Units Subcutaneous TID WC   insulin glargine-yfgn  12 Units Subcutaneous QHS   isosorbide dinitrate  10 mg Oral Q8H   nicotine  21 mg Transdermal Daily   sodium chloride flush  3 mL Intravenous Q12H   Continuous Infusions:  sodium chloride 6 mL/hr at 07/02/21 1200   furosemide (LASIX) 200 mg in dextrose 5 % 100 mL (2 mg/mL) infusion 8 mg/hr (07/02/21 1200)   milrinone 0.25 mcg/kg/min (07/02/21 1200)   PRN Meds: sodium chloride, acetaminophen, docusate sodium, polyethylene glycol, sodium chloride flush, traMADol   Vital Signs    Vitals:   07/02/21 0800 07/02/21 1000 07/02/21 1100 07/02/21 1200  BP: 135/76 (!) 150/81  (!) 160/83  Pulse: 83 86 85 84  Resp: 13 13 12 14   Temp:      TempSrc:      SpO2:      Weight:      Height:        Intake/Output Summary (Last 24 hours) at 07/02/2021 1354 Last data filed at 07/02/2021 1200 Gross per 24 hour  Intake 482.73 ml  Output 4390 ml  Net -3907.27 ml      07/02/2021    5:00 AM 06/30/2021    5:00 AM 06/29/2021    3:09 PM  Last 3 Weights  Weight (lbs) 160 lb 11.5 oz 168 lb 14 oz 166 lb 14.2 oz  Weight (kg) 72.9 kg 76.6 kg 75.7 kg      Telemetry    Sinus rhythm, heart rate 85- Personally Reviewed  ECG     - Personally Reviewed  Physical Exam   GEN: No acute distress.   Neck: No JVD Cardiac: RRR, no murmurs, rubs, or gallops.  Respiratory: Poor  inspiratory effort GI: Soft, nontender, non-distended  MS: 1-2+ edema; No deformity. Neuro:  Nonfocal  Psych: Normal affect   Labs    High Sensitivity Troponin:   Recent Labs  Lab 06/23/21 1110 06/23/21 1620  TROPONINIHS 68* 60*     Chemistry Recent Labs  Lab 06/30/21 0447 07/01/21 0451 07/02/21 0513  NA 136 136 139  K 4.1 4.0 4.0  CL 106 104 104  CO2 24 23 26   GLUCOSE 265* 115* 195*  BUN 55* 55* 53*  CREATININE 3.98* 3.93* 3.75*  CALCIUM 8.3* 8.5* 8.4*  MG 2.0 1.7 2.3  GFRNONAA 16* 16* 17*  ANIONGAP 6 9 9     Lipids No results for input(s): "CHOL", "TRIG", "HDL", "LABVLDL", "LDLCALC", "CHOLHDL" in the last 168 hours.  Hematology Recent Labs  Lab 06/30/21 0447 07/01/21 0451 07/02/21 0513  WBC 5.2 6.3 6.1  RBC 3.06* 3.00* 2.91*  HGB 8.9* 8.6* 8.5*  HCT 27.7* 26.9* 26.3*  MCV 90.5 89.7 90.4  MCH 29.1 28.7 29.2  MCHC 32.1 32.0 32.3  RDW 14.6 14.8 14.8  PLT 331 344 378   Thyroid No  results for input(s): "TSH", "FREET4" in the last 168 hours.  BNPNo results for input(s): "BNP", "PROBNP" in the last 168 hours.  DDimer No results for input(s): "DDIMER" in the last 168 hours.   Radiology    No results found.  Cardiac Studies   Echo 06/26/2021 1. Left ventricular ejection fraction, by estimation, is 25 to 30%. The  left ventricle has severely decreased function. The left ventricle  demonstrates global hypokinesis. The left ventricular internal cavity size  was mildly dilated. Left ventricular  diastolic parameters are consistent with Grade II diastolic dysfunction  (pseudonormalization). The average left ventricular global longitudinal  strain is 9.0 %. The global longitudinal strain is abnormal.   2. Right ventricular systolic function is normal. The right ventricular  size is normal. Tricuspid regurgitation signal is inadequate for assessing  PA pressure.    Patient Profile     66 y.o. male with history of nonischemic cardiomyopathy EF 25 to 30%, CKD,  hypertension, diabetes, CVA presenting with worsening shortness of breath and edema, being seen for acute heart failure.  Assessment & Plan    NICM EF 25% -Still volume overloaded, 1-2+ edema -BP slightly elevated, -Reduce milrinone drip to 0.125 mg, continue Lasix drip.  Consider stopping milrinone drip tomorrow if BP stays elevated. -Creatinine slightly improved compared to yesterday. -Net -2.4 L over the past 24 hours, creatinine stable. -Continue Coreg, Isordil, hydralazine  2.  CKD -Monitor creatinine with diuresis -Avoid nephrotoxic's -Creatinine slightly improved with diuresing compared to yesterday  Total encounter time more than 50 minutes  Greater than 50% was spent in counseling and coordination of care with the patient     Signed, Kate Sable, MD  07/02/2021, 1:54 PM

## 2021-07-02 NOTE — Progress Notes (Signed)
Progress Note   Patient: Shawn [name] JME:[id number] DOB: Jun 09, 1955 DOA: 06/23/2021     8 DOS: the patient was seen and examined on 07/02/2021   Brief hospital course: Taken from prior notes.  Shawn [name] is a 66 y.o. male with medical history significant of poorly controlled t2dm, ckd 3b, combined CHF, smoking, alcohol abuse, hypertension, prior CVA, malnutrition who presented scrotal and leg swelling.  Home torsemide and Aldactone were held after hospitalization in May 2023 for DKA and AKI, and not resumed due to no PCP followup (per pt, PCP wanted him to find another doctor). --on current presentation, BNP 2973, imaging studies showed pulm edema, ascites, scrotal edema.  Also has BLE and pedal edema.  Swelling was severe and limiting his movements.  Also has small bilateral pleural effusions.  home torsemide and Aldactone were held after hospitalization in May 2023 for DKA and AKI, and not resumed due to no PCP followup.  Current Echo showed LVEF had worsened from 40-45% to 25-30% in just 5 months.  Cardiology consulted.  Right heart cath on 6/26, found to have Moderately to severely elevated right heart and pulmonary artery pressures.  Due to lack of response to regular diuresis, patient was transferred to stepdown and started on  milrinone and Lasix infusion.  6/28: Started having good diuresis, total of 2615 mL of her output recorded.  Renal function seems stable, creatinine at 3.93 today.  Cardiology is planning to continue milrinone and Lasix infusion at this time.  Clinically improving.  6/29: Patient remained stable, mildly elevated blood pressure, milrinone drip rate was decreased.  Cardiology would like to continue Lasix infusion.  Net 2.4 L over the past 24 hours.  Creatinine with some improvement.   Assessment and Plan: * Acute on chronic HFrEF (heart failure with reduced ejection fraction) ([place]) Patient admitted for concern of acute on chronic HFrEF, repeat  echocardiogram with worsening of EF to 25 to 30%.  Cardiology is on board. Significant elevated BNP at 2973. Patient was started on milrinone and Lasix infusion with improvement in diuresis and edema. -Continue milrinone, rate was decreased today with a plan to stop tomorrow  -Continue Lasix infusion -Continue with daily BMP and weight -Strict intake and output  Anasarca Patient was started on milrinone and Lasix infusion and started getting some diuresis.  Renal function seems stable but remained elevated than baseline. Nephrology and cardiology on board.  Output and edema improving. -Continue to monitor   Acute kidney injury superimposed on CKD 3b ([name]) Worsening of creatinine with diuresis, also received IV contrast.  Concern of cardiorenal syndrome.  Baseline around 1.27, peaked at 4.26 and now with slight improvement. Nephrology is on board. -Continue to monitor -Avoid nephrotoxins -Continue to hold Entresto -Strict intake and output  Type 2 diabetes mellitus with stage 3b chronic kidney disease, with long-term current use of insulin ([name]) Apparently patient had an history of hypoglycemia and discharge home without any insulin during prior hospitalization at [facility].  Then went to [facility] and found to have hyperglycemia and started on 20 units of glargine daily. Currently CBG within goal. -Continue glargine at 12 units at night -SSI  Essential hypertension Blood pressure mildly elevated. -Continue Coreg. -Keep holding Entresto due to AKI -Continue Lasix infusion -She was started on hydralazine, and Isordil by cardiology -Milrinone drip rate was decreased today  Iron deficiency anemia --s/p IV iron --plan to discharge on oral iron supplement.    Subjective: Patient was seen and examined today.  He was  resting comfortably in bed.  No new complaints.  He was hoping that his infusions can be stopped and he can go home soon.  Physical Exam: Vitals:   07/02/21 1000 07/02/21  1100 07/02/21 1200 07/02/21 1400  BP: (!) 150/81  (!) 160/83 (!) 164/90  Pulse: 86 85 84 81  Resp: 13 12 14 14   Temp:   98.4 F (36.9 C)   TempSrc:   Oral   SpO2:      Weight:      Height:       General.  Well-developed gentleman in no acute distress. Pulmonary.  Lungs clear bilaterally, normal respiratory effort. CV.  Regular rate and rhythm, no JVD, rub or murmur. Abdomen.  Soft, nontender, nondistended, BS positive. CNS.  Alert and oriented .  No focal neurologic deficit. Extremities.  Trace LE edema, no cyanosis, pulses intact and symmetrical. Psychiatry.  Judgment and insight appears normal.  Data Reviewed: Prior data reviewed  Family Communication: Discussed with patient  Disposition: Status is: Inpatient Remains inpatient appropriate because: Severity of illness   Planned Discharge Destination: Home with Home Health  DVT prophylaxis.  Heparin Time spent: 52 minutes   This record has been created using Systems analyst. Errors have been sought and corrected,but may not always be located. Such creation errors do not reflect on the standard of care.  Author: Lorella Nimrod, MD 07/02/2021 3:52 PM  For on call review [email or web address].

## 2021-07-02 NOTE — Assessment & Plan Note (Signed)
Patient was started on milrinone and Lasix infusion and started getting some diuresis.  Renal function seems stable but remained elevated than baseline. Nephrology and cardiology on board.  Output and edema improving. -Continue to monitor

## 2021-07-02 NOTE — Inpatient Diabetes Management (Signed)
Inpatient Diabetes Program Recommendations  AACE/ADA: New Consensus Statement on Inpatient Glycemic Control  Target Ranges:  Prepandial:   less than 140 mg/dL      Peak postprandial:   less than 180 mg/dL (1-2 hours)      Critically ill patients:  140 - 180 mg/dL    Latest Reference Range & Units 07/01/21 08:12 07/01/21 11:58 07/01/21 16:52 07/01/21 21:18 07/02/21 07:20  Glucose-Capillary 70 - 99 mg/dL 116 (H) 143 (H) 197 (H) 238 (H) 196 (H)   Review of Glycemic Control  Diabetes history: DM2 Outpatient Diabetes medications: Semglee 20 units daily, Novolog 6 units TID with meals Current orders for Inpatient glycemic control: Semglee 12 units QHS, Novolog 0-15 units TID with meals, Novolog 2 units TID with meals   Inpatient Diabetes Program Recommendations:    Insulin: Please consider increasing meal coverage to Novolog 3 units TID with meals and adding Novolog 0-5 units QHS for bedtime correction.  Thanks, Barnie Alderman, RN, MSN, [place] [facility] Coordinator Inpatient Diabetes Program [phone] (Team Pager from 8am to [place])

## 2021-07-03 DIAGNOSIS — N189 Chronic kidney disease, unspecified: Secondary | ICD-10-CM | POA: Diagnosis not present

## 2021-07-03 DIAGNOSIS — N179 Acute kidney failure, unspecified: Secondary | ICD-10-CM | POA: Diagnosis not present

## 2021-07-03 DIAGNOSIS — I5023 Acute on chronic systolic (congestive) heart failure: Secondary | ICD-10-CM | POA: Diagnosis not present

## 2021-07-03 LAB — CBC
HCT: 28.1 % — ABNORMAL LOW (ref 39.0–52.0)
Hemoglobin: 8.9 g/dL — ABNORMAL LOW (ref 13.0–17.0)
MCH: 28.6 pg (ref 26.0–34.0)
MCHC: 31.7 g/dL (ref 30.0–36.0)
MCV: 90.4 fL (ref 80.0–100.0)
Platelets: 388 10*3/uL (ref 150–400)
RBC: 3.11 MIL/uL — ABNORMAL LOW (ref 4.22–5.81)
RDW: 15.2 % (ref 11.5–15.5)
WBC: 6.2 10*3/uL (ref 4.0–10.5)
nRBC: 0 % (ref 0.0–0.2)

## 2021-07-03 LAB — BASIC METABOLIC PANEL
Anion gap: 9 (ref 5–15)
BUN: 52 mg/dL — ABNORMAL HIGH (ref 8–23)
CO2: 26 mmol/L (ref 22–32)
Calcium: 8.9 mg/dL (ref 8.9–10.3)
Chloride: 102 mmol/L (ref 98–111)
Creatinine, Ser: 3.41 mg/dL — ABNORMAL HIGH (ref 0.61–1.24)
GFR, Estimated: 19 mL/min — ABNORMAL LOW (ref >60)
Glucose, Bld: 227 mg/dL — ABNORMAL HIGH (ref 70–99)
Potassium: 4.1 mmol/L (ref 3.5–5.1)
Sodium: 137 mmol/L (ref 135–145)

## 2021-07-03 LAB — COOXEMETRY PANEL
Carboxyhemoglobin: 1.1 % (ref 0.5–1.5)
Methemoglobin: <0.7 % (ref 0.0–1.5)
O2 Saturation: 97.5 %
Total hemoglobin: 9.2 g/dL — ABNORMAL LOW (ref 12.0–16.0)
Total oxygen content: 95.8 %

## 2021-07-03 LAB — GLUCOSE, CAPILLARY
Glucose-Capillary: 199 mg/dL — ABNORMAL HIGH (ref 70–99)
Glucose-Capillary: 163 mg/dL — ABNORMAL HIGH (ref 70–99)
Glucose-Capillary: 182 mg/dL — ABNORMAL HIGH (ref 70–99)
Glucose-Capillary: 287 mg/dL — ABNORMAL HIGH (ref 70–99)

## 2021-07-03 LAB — MAGNESIUM: Magnesium: 1.8 mg/dL (ref 1.7–2.4)

## 2021-07-03 MED ORDER — ISOSORBIDE DINITRATE 20 MG PO TABS
20.0000 mg | ORAL_TABLET | Freq: Three times a day (TID) | ORAL | Status: DC
  Administered 2021-07-03 – 2021-07-07 (×13): 20 mg via ORAL
  Filled 2021-07-03 (×13): qty 1

## 2021-07-03 MED ORDER — MELATONIN 5 MG PO TABS
5.0000 mg | ORAL_TABLET | Freq: Every day | ORAL | Status: DC
  Administered 2021-07-03 – 2021-07-06 (×4): 5 mg via ORAL
  Filled 2021-07-03 (×4): qty 1

## 2021-07-03 MED ORDER — HYDRALAZINE HCL 50 MG PO TABS
50.0000 mg | ORAL_TABLET | Freq: Three times a day (TID) | ORAL | Status: DC
  Administered 2021-07-03 – 2021-07-04 (×3): 50 mg via ORAL
  Filled 2021-07-03 (×3): qty 1

## 2021-07-03 MED ORDER — SIMETHICONE 80 MG PO CHEW: 160.0000 mg | CHEWABLE_TABLET | Freq: Four times a day (QID) | ORAL | Status: DC | PRN

## 2021-07-03 NOTE — Assessment & Plan Note (Signed)
Blood pressure remained elevated. -Continue Coreg. -Keep holding Entresto due to AKI -Continue Lasix infusion -Isordil and hydralazine dose was increased

## 2021-07-03 NOTE — Progress Notes (Signed)
Progress Note   Patient: Shawn [name] JJK:[id number] DOB: [birth year] DOA: 06/23/2021     9 DOS: the patient was seen and examined on 07/03/2021   Brief hospital course: Taken from prior notes.  SHANTA DORVIL is a 66 y.o. male with medical history significant of poorly controlled t2dm, ckd 3b, combined CHF, smoking, alcohol abuse, hypertension, prior CVA, malnutrition who presented scrotal and leg swelling.  Home torsemide and Aldactone were held after hospitalization in May 2023 for DKA and AKI, and not resumed due to no PCP followup (per pt, PCP wanted him to find another doctor). --on current presentation, BNP 2973, imaging studies showed pulm edema, ascites, scrotal edema.  Also has BLE and pedal edema.  Swelling was severe and limiting his movements.  Also has small bilateral pleural effusions.  home torsemide and Aldactone were held after hospitalization in May 2023 for DKA and AKI, and not resumed due to no PCP followup.  Current Echo showed LVEF had worsened from 40-45% to 25-30% in just 5 months.  Cardiology consulted.  Right heart cath on 6/26, found to have Moderately to severely elevated right heart and pulmonary artery pressures.  Due to lack of response to regular diuresis, patient was transferred to stepdown and started on  milrinone and Lasix infusion.  6/28: Started having good diuresis, total of 2615 mL of her output recorded.  Renal function seems stable, creatinine at 3.93 today.  Cardiology is planning to continue milrinone and Lasix infusion at this time.  Clinically improving.  6/29: Patient remained stable, mildly elevated blood pressure, milrinone drip rate was decreased.  Cardiology would like to continue Lasix infusion.  Net 2.4 L over the past 24 hours.  Creatinine with some improvement.  6/30: Patient continued to improve.  Improving renal function.  Milrinone was discontinued and plan is to continue heparin infusion for now.  Hydralazine and isosorbide  dinitrate dose was increased due to persistently elevated blood pressure. Cardiology and nephrology is on board   Assessment and Plan: * Acute on chronic HFrEF (heart failure with reduced ejection fraction) ([place]) Patient admitted for concern of acute on chronic HFrEF, repeat echocardiogram with worsening of EF to 25 to 30%.  Cardiology is on board. Significant elevated BNP at 2973. Patient was started on milrinone and Lasix infusion with improvement in diuresis and edema. -Milrinone was stopped today -Continue Lasix infusion -Continue with daily BMP and weight -Strict intake and output  Anasarca Patient was started on milrinone and Lasix infusion and started getting some diuresis.  Renal function slowly improving but remained elevated than baseline. Nephrology and cardiology on board.  Output and edema improving. -Continue to monitor   Acute kidney injury superimposed on CKD 3b ([name]) Worsening of creatinine with diuresis, also received IV contrast.  Concern of cardiorenal syndrome.  Baseline around 1.27, peaked at 4.26 and now with slight improvement. Nephrology is on board. -Continue to monitor -Avoid nephrotoxins -Continue to hold Entresto -Strict intake and output  Type 2 diabetes mellitus with stage 3b chronic kidney disease, with long-term current use of insulin ([name]) Apparently patient had an history of hypoglycemia and discharge home without any insulin during prior hospitalization at [facility].  Then went to [facility] and found to have hyperglycemia and started on 20 units of glargine daily. Currently CBG within goal. -Continue glargine at 12 units at night -SSI  Essential hypertension Blood pressure remained elevated. -Continue Coreg. -Keep holding Entresto due to AKI -Continue Lasix infusion -Isordil and hydralazine dose was increased  Iron deficiency anemia --s/p IV iron --plan to discharge on oral iron supplement.    Subjective: Patient was sitting comfortably in  chair when seen today.  Feeling much improved and wanted to go home.  No new complaints  Physical Exam: Vitals:   07/03/21 1326 07/03/21 1400 07/03/21 1511 07/03/21 1600  BP: (!) 145/86 115/74  (!) 151/95  Pulse: 73 81  70  Resp: 18 13  15   Temp:  97.6 F (36.4 C) 97.9 F (36.6 C)   TempSrc:  Axillary Oral   SpO2:    96%  Weight:      Height:       General.     In no acute distress. Pulmonary.  Lungs clear bilaterally, normal respiratory effort. CV.  Regular rate and rhythm, no JVD, rub or murmur. Abdomen.  Soft, nontender, nondistended, BS positive. CNS.  Alert and oriented .  No focal neurologic deficit. Extremities.  Trace LE edema, no cyanosis, pulses intact and symmetrical. Psychiatry.  Judgment and insight appears normal.  Data Reviewed: Prior data reviewed  Family Communication: Discussed with patient  Disposition: Status is: Inpatient Remains inpatient appropriate because: Severity of illness   Planned Discharge Destination: Home with Home Health  Time spent: 45 minutes  This record has been created using Systems analyst. Errors have been sought and corrected,but may not always be located. Such creation errors do not reflect on the standard of care.  Author: Lorella Nimrod, MD 07/03/2021 5:12 PM  For on call review [email or web address].

## 2021-07-03 NOTE — Progress Notes (Signed)
[facility]  ROUNDING NOTE   Subjective:   Shawn [name] is a 66 y.o. AA male with past medical conditions of CVA, diabetes, CHF, hypertension, and chronic kidney disease Iib. Patient presented to ED with lower extremity swelling and scrotal. Patient is admitted for Anasarca [R60.1] Testicle swelling [N50.89] Acute on chronic congestive heart failure, unspecified heart failure type [facility]) [I50.9]  Patient is known to our clinic and is followed by Dr Candiss Norse.   Update: Patient sitting up in bed Completing breakfast Room air No lower extremity edema  Furosemide drip 8 mg/hr UOP 4.1L   Objective:  Vital signs in last 24 hours:  Temp:  [98.4 F (36.9 C)-99.3 F (37.4 C)] 98.9 F (37.2 C) (06/30 0800) Pulse Rate:  [76-93] 76 (06/30 1000) Resp:  [14-21] 14 (06/30 0300) BP: (127-173)/(72-115) 127/72 (06/30 1000) SpO2:  [94 %-95 %] 94 % (06/30 0400) Weight:  [70.4 kg] 70.4 kg (06/30 0400)  Weight change: -2.5 kg Filed Weights   06/30/21 0500 07/02/21 0500 07/03/21 0400  Weight: 76.6 kg 72.9 kg 70.4 kg    Intake/Output: I/O last 3 completed shifts: In: 1001.5 [P.O.:530; I.V.:471.5] Out: 5855 [Urine:5855]   Intake/Output this shift:  Total I/O In: 282.8 [P.O.:270; I.V.:12.8] Out: 825 [Urine:825]  Physical Exam: General: NAD  Head: Normocephalic, atraumatic. Moist oral mucosal membranes  Eyes: Anicteric  Lungs:  Clear to auscultation, normal effort  Heart: Regular rate and rhythm  Abdomen:  Soft, nontender  Extremities: No peripheral edema.  Neurologic: Nonfocal, moving all four extremities  Skin: No lesions  Access: None  GU-scrotal edema (improving)  Basic Metabolic Panel: Recent Labs  Lab 06/29/21 0418 06/30/21 0447 07/01/21 0451 07/02/21 0513 07/03/21 0408  NA 135 136 136 139 137  K 4.3 4.1 4.0 4.0 4.1  CL 104 106 104 104 102  CO2 22 24 23 26 26   GLUCOSE 171* 265* 115* 195* 227*  BUN 54* 55* 55* 53* 52*  CREATININE 4.26* 3.98*  3.93* 3.75* 3.41*  CALCIUM 8.6* 8.3* 8.5* 8.4* 8.9  MG 2.0 2.0 1.7 2.3 1.8     Liver Function Tests: No results for input(s): "AST", "ALT", "ALKPHOS", "BILITOT", "PROT", "ALBUMIN" in the last 168 hours.  No results for input(s): "LIPASE", "AMYLASE" in the last 168 hours. No results for input(s): "AMMONIA" in the last 168 hours.  CBC: Recent Labs  Lab 06/29/21 0418 06/30/21 0447 07/01/21 0451 07/02/21 0513 07/03/21 0408  WBC 5.3 5.2 6.3 6.1 6.2  HGB 9.3* 8.9* 8.6* 8.5* 8.9*  HCT 29.6* 27.7* 26.9* 26.3* 28.1*  MCV 90.2 90.5 89.7 90.4 90.4  PLT 360 331 344 378 388     Cardiac Enzymes: No results for input(s): "CKTOTAL", "CKMB", "CKMBINDEX", "TROPONINI" in the last 168 hours.  BNP: Invalid input(s): "POCBNP"  CBG: Recent Labs  Lab 07/02/21 0720 07/02/21 1144 07/02/21 1628 07/03/21 0725 07/03/21 1130  GLUCAP 196* 207* 213* 199* 163*     Microbiology: Results for orders placed or performed during the hospital encounter of 06/23/21  Urine Culture     Status: Abnormal   Collection Time: 06/23/21 11:10 AM   Specimen: Urine, Clean Catch  Result Value Ref Range Status   Specimen Description   Final    URINE, CLEAN CATCH Performed at [facility], [address], [place], [place] [address]    Special Requests   Final    NONE Performed at [facility], [address], [place], [place] [address]    Culture [place]  RECOLLECTION (A)  Final   Report Status 06/24/2021 FINAL  Final  Body fluid culture w Gram Stain     Status: None   Collection Time: 06/25/21 11:29 AM   Specimen: Peritoneal Washings  Result Value Ref Range Status   Specimen Description   Final    PERITONEAL Performed at [facility], [address] [place], [place] [address]    Special Requests   Final    NONE Performed at [facility], [place], [place] [address]    Gram Stain NO WBC SEEN NO ORGANISMS SEEN    Final   Culture   Final    NO GROWTH 3 DAYS Performed at [facility], [place] [address] [place], [place] [address]    Report Status 06/28/2021 FINAL  Final  MRSA Next Gen by PCR, Nasal     Status: None   Collection Time: 06/29/21  3:12 PM   Specimen: Nasal Mucosa; Nasal Swab  Result Value Ref Range Status   MRSA by PCR Next Gen NOT DETECTED NOT DETECTED Final    Comment: (NOTE) The GeneXpert MRSA Assay (FDA approved for NASAL specimens only), is one component of a comprehensive MRSA colonization surveillance program. It is not intended to diagnose MRSA infection nor to guide or monitor treatment for MRSA infections. Test performance is not FDA approved in patients less than 55 years old. Performed at [facility], [address] [place], [place] [address]     Coagulation Studies: No results for input(s): "LABPROT", "INR" in the last 72 hours.  Urinalysis: No results for input(s): "COLORURINE", "LABSPEC", "PHURINE", "GLUCOSEU", "HGBUR", "BILIRUBINUR", "KETONESUR", "PROTEINUR", "UROBILINOGEN", "NITRITE", "LEUKOCYTESUR" in the last 72 hours.  Invalid input(s): "APPERANCEUR"    Imaging: No results found.   Medications:    sodium chloride 6 mL/hr at 07/03/21 0800   furosemide (LASIX) 200 mg in dextrose 5 % 100 mL (2 mg/mL) infusion 8 mg/hr (07/03/21 1024)    aspirin  81 mg Oral Daily   carvedilol  3.125 mg Oral BID WC   Chlorhexidine Gluconate Cloth  6 each Topical Daily   heparin injection (subcutaneous)  5,000 Units Subcutaneous Q8H   hydrALAZINE  50 mg Oral Q8H   insulin aspart  0-15 Units Subcutaneous TID WC   insulin aspart  2 Units Subcutaneous TID WC   insulin glargine-yfgn  12 Units Subcutaneous QHS   isosorbide dinitrate  20 mg Oral Q8H   melatonin  5 mg Oral QHS   nicotine  21 mg Transdermal Daily   sodium chloride flush  3 mL Intravenous Q12H   sodium chloride, acetaminophen, docusate sodium, polyethylene glycol, simethicone, sodium chloride  flush, traMADol  Assessment/ Plan:  [name] is a 66 y.o.  male with past medical conditions of CVA, diabetes, CHF, hypertension, and chronic kidney disease IIib. Patient presented to ED with lower extremity swelling and scrotal. Patient is admitted for Anasarca [R60.1] Testicle swelling [N50.89] Acute on chronic congestive heart failure, unspecified heart failure type ([place]) [I50.9]   Acute Kidney Injury on chronic kidney disease stage IIIb with baseline creatinine 1.27 06/23/21.  Acute kidney injury likely secondary to IV contrast exposure and fluid shifts.  Creatinine initially increased with diuresis, but is now improving. Great urine output. Will continue to monitor during diuretic therapy.  Lab Results  Component Value Date   CREATININE 3.41 (H) 07/03/2021   CREATININE 3.75 (H) 07/02/2021   CREATININE 3.93 (H) 07/01/2021    Intake/Output Summary (Last 24  hours) at 07/03/2021 1137 Last data filed at 07/03/2021 1129 Gross per 24 hour  Intake 1059.84 ml  Output 4490 ml  Net -3430.16 ml    2. Hypertension with chronic kidney disease.  Home regimen includes Carvedilol and entresto. Entresto currently held.   Remains on Furosemide drip, followed by Cardiology. Blood pressure stable, 127/72.   3. 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 Home regimen includes NovoLog and glargine-yfgn.  Most recent hemoglobin A1c is 11.9 on 06/24/21.    4.Anemia-Iron deficiency anemia   Latest Reference Range & Units 06/27/21 07:01  Iron 45 - 182 ug/dL 24 (L)  UIBC ug/dL 210  TIBC 250 - 450 ug/dL 234 (L)  Saturation Ratios 17.9 - 39.5 % 10 (L)  Ferritin 24 - 336 ng/mL 59  Folate >5.9 ng/mL 25.0  (L): Data is abnormally low  Hgb 8.9, improving but remains below target. Will continue to monitor for now.  5. Acute urinary retention Resolved with foley placement  6.  Acute on chronic systolic heart failure. ECHO on 06/26/21 shows EF 25-30% global  hypokinesis, grade 2 diastolic dysfunction, moderate mitral regurgitation, and small pericardial effusion.   Right heart cath on 06/29/21, found to have elevated left heart filling pressure and moderately to severely elevated right heart and pulmonary artery pressures.   Milrinone and furosemide drip managed by cardiology. Considering reducing Milrinone slowly.     LOS: 9 [place] 6/30/202311:37 AM

## 2021-07-03 NOTE — Inpatient Diabetes Management (Signed)
Inpatient Diabetes Program Recommendations  AACE/ADA: New Consensus Statement on Inpatient Glycemic Control (2015)  Target Ranges:  Prepandial:   less than 140 mg/dL      Peak postprandial:   less than 180 mg/dL (1-2 hours)      Critically ill patients:  140 - 180 mg/dL   Lab Results  Component Value Date   GLUCAP 199 (H) 07/03/2021   HGBA1C 11.9 (H) 06/24/2021    Review of Glycemic Control  Latest Reference Range & Units 07/02/21 07:20 07/02/21 11:44 07/02/21 16:28 07/03/21 07:25  Glucose-Capillary 70 - 99 mg/dL 196 (H) 207 (H) 213 (H) 199 (H)  (H): Data is abnormally high  Diabetes history: DM2 Outpatient Diabetes medications: Semglee 20 units daily, Novolog 6 units TID with meals Current orders for Inpatient glycemic control: Semglee 12 units QHS, Novolog 0-15 units TID with meals, Novolog 2 units TID with meals   Inpatient Diabetes Program Recommendations:     Insulin: Please consider increasing meal coverage to Novolog 3 units TID with meals and adding Novolog 0-5 units QHS for bedtime correction.  Thank you, Nani Gasser. Londyn Wotton, RN, MSN, CDE  Diabetes Coordinator Inpatient Glycemic Control Team Team Pager [phone] (8am-5pm) 07/03/2021 9:04 AM

## 2021-07-03 NOTE — Assessment & Plan Note (Signed)
Patient was started on milrinone and Lasix infusion and started getting some diuresis.  Renal function slowly improving but remained elevated than baseline. Nephrology and cardiology on board.  Output and edema improving. -Continue to monitor

## 2021-07-03 NOTE — Assessment & Plan Note (Signed)
Worsening of creatinine with diuresis, also received IV contrast.  Concern of cardiorenal syndrome.  Baseline around 1.27, peaked at 4.26 and now with slight improvement. Nephrology is on board. -Continue to monitor -Avoid nephrotoxins -Continue to hold Entresto -Strict intake and output

## 2021-07-03 NOTE — Assessment & Plan Note (Signed)
Patient admitted for concern of acute on chronic HFrEF, repeat echocardiogram with worsening of EF to 25 to 30%.  Cardiology is on board. Significant elevated BNP at 2973. Patient was started on milrinone and Lasix infusion with improvement in diuresis and edema. -Milrinone was stopped today -Continue Lasix infusion -Continue with daily BMP and weight -Strict intake and output

## 2021-07-03 NOTE — Plan of Care (Signed)
Milrinone stopped this am but Lasix infusion continues. Up in chair and eating 100% of meals. Foley that was placed by urology remains in place, diuresing well. Alert and oriented, denies pain. Bilateral knee abrasions, (left worse than right) covered with foam dressings.  NSR.  Problem: Education: Goal: Knowledge of General Education information will improve Description: Including pain rating scale, medication(s)/side effects and non-pharmacologic comfort measures Outcome: Progressing   Problem: Health Behavior/Discharge Planning: Goal: Ability to manage health-related needs will improve Outcome: Progressing   Problem: Clinical Measurements: Goal: Ability to maintain clinical measurements within normal limits will improve Outcome: Progressing Goal: Will remain free from infection Outcome: Progressing Goal: Diagnostic test results will improve Outcome: Progressing Goal: Respiratory complications will improve Outcome: Progressing Goal: Cardiovascular complication will be avoided Outcome: Progressing   Problem: Activity: Goal: Risk for activity intolerance will decrease Outcome: Progressing   Problem: Nutrition: Goal: Adequate nutrition will be maintained Outcome: Progressing   Problem: Coping: Goal: Level of anxiety will decrease Outcome: Progressing   Problem: Elimination: Goal: Will not experience complications related to bowel motility Outcome: Progressing Goal: Will not experience complications related to urinary retention Outcome: Progressing   Problem: Pain Managment: Goal: General experience of comfort will improve Outcome: Progressing   Problem: Safety: Goal: Ability to remain free from injury will improve Outcome: Progressing   Problem: Skin Integrity: Goal: Risk for impaired skin integrity will decrease Outcome: Progressing   Problem: Education: Goal: Ability to describe self-care measures that may prevent or decrease complications (Diabetes Survival  Skills Education) will improve Outcome: Progressing Goal: Individualized Educational Video(s) Outcome: Progressing   Problem: Coping: Goal: Ability to adjust to condition or change in health will improve Outcome: Progressing   Problem: Fluid Volume: Goal: Ability to maintain a balanced intake and output will improve Outcome: Progressing   Problem: Health Behavior/Discharge Planning: Goal: Ability to identify and utilize available resources and services will improve Outcome: Progressing Goal: Ability to manage health-related needs will improve Outcome: Progressing   Problem: Metabolic: Goal: Ability to maintain appropriate glucose levels will improve Outcome: Progressing   Problem: Nutritional: Goal: Maintenance of adequate nutrition will improve Outcome: Progressing Goal: Progress toward achieving an optimal weight will improve Outcome: Progressing   Problem: Skin Integrity: Goal: Risk for impaired skin integrity will decrease Outcome: Progressing   Problem: Tissue Perfusion: Goal: Adequacy of tissue perfusion will improve Outcome: Progressing   Problem: Education: Goal: Understanding of CV disease, CV risk reduction, and recovery process will improve Outcome: Progressing Goal: Individualized Educational Video(s) Outcome: Progressing   Problem: Activity: Goal: Ability to return to baseline activity level will improve Outcome: Progressing   Problem: Cardiovascular: Goal: Ability to achieve and maintain adequate cardiovascular perfusion will improve Outcome: Progressing Goal: Vascular access site(s) Level 0-1 will be maintained Outcome: Progressing   Problem: Health Behavior/Discharge Planning: Goal: Ability to safely manage health-related needs after discharge will improve Outcome: Progressing

## 2021-07-03 NOTE — Progress Notes (Signed)
Progress Note  Patient Name: Shawn [name] Date of Encounter: 07/03/2021  Primary Cardiologist: Rockey Situ  Subjective   Dyspnea and scrotal edema significantly improved.  No chest pain.  Neck discomfort has also improved.  Co. ox 97 this morning.  Renal function improving.  Documented urine output 3.2 L for the past 24 hours with a net -6.6 L for the admission.  Weight trend of 72.9 to 70.4 kg over the past 24 hours.  Inpatient Medications    Scheduled Meds:  aspirin  81 mg Oral Daily   carvedilol  3.125 mg Oral BID WC   Chlorhexidine Gluconate Cloth  6 each Topical Daily   heparin injection (subcutaneous)  5,000 Units Subcutaneous Q8H   hydrALAZINE  50 mg Oral Q8H   insulin aspart  0-15 Units Subcutaneous TID WC   insulin aspart  2 Units Subcutaneous TID WC   insulin glargine-yfgn  12 Units Subcutaneous QHS   isosorbide dinitrate  20 mg Oral Q8H   melatonin  5 mg Oral QHS   nicotine  21 mg Transdermal Daily   sodium chloride flush  3 mL Intravenous Q12H   Continuous Infusions:  sodium chloride 6 mL/hr at 07/03/21 0800   furosemide (LASIX) 200 mg in dextrose 5 % 100 mL (2 mg/mL) infusion 8 mg/hr (07/03/21 1024)   milrinone 0.125 mcg/kg/min (07/03/21 0800)   PRN Meds: sodium chloride, acetaminophen, docusate sodium, polyethylene glycol, simethicone, sodium chloride flush, traMADol   Vital Signs    Vitals:   07/03/21 0200 07/03/21 0300 07/03/21 0400 07/03/21 0800  BP: (!) 168/94  (!) 152/93 140/78  Pulse: 86 87 85 79  Resp: 19 14    Temp:    98.9 F (37.2 C)  TempSrc:    Oral  SpO2:  95% 94%   Weight:   70.4 kg   Height:        Intake/Output Summary (Last 24 hours) at 07/03/2021 1032 Last data filed at 07/03/2021 0800 Gross per 24 hour  Intake 819.84 ml  Output 4015 ml  Net -3195.16 ml   Filed Weights   06/30/21 0500 07/02/21 0500 07/03/21 0400  Weight: 76.6 kg 72.9 kg 70.4 kg    Telemetry    Sinus rhythm with rare isolated PVC - Personally  Reviewed  ECG    No new tracings - Personally Reviewed  Physical Exam   GEN: No acute distress.   Neck: JVD difficult to assess secondary to right IJ venous catheter. Cardiac: RRR, no murmurs, rubs, or gallops.  Respiratory: Clear to auscultation bilaterally.  GI: Soft, nontender, non-distended.   MS: Mild lower extremity edema; No deformity. Neuro:  Alert and oriented x 3; Nonfocal.  Psych: Normal affect.  Labs    Chemistry Recent Labs  Lab 07/01/21 0451 07/02/21 0513 07/03/21 0408  NA 136 139 137  K 4.0 4.0 4.1  CL 104 104 102  CO2 23 26 26   GLUCOSE 115* 195* 227*  BUN 55* 53* 52*  CREATININE 3.93* 3.75* 3.41*  CALCIUM 8.5* 8.4* 8.9  GFRNONAA 16* 17* 19*  ANIONGAP 9 9 9      Hematology Recent Labs  Lab 07/01/21 0451 07/02/21 0513 07/03/21 0408  WBC 6.3 6.1 6.2  RBC 3.00* 2.91* 3.11*  HGB 8.6* 8.5* 8.9*  HCT 26.9* 26.3* 28.1*  MCV 89.7 90.4 90.4  MCH 28.7 29.2 28.6  MCHC 32.0 32.3 31.7  RDW 14.8 14.8 15.2  PLT 344 378 388    Cardiac EnzymesNo results for input(s): "TROPONINI" in  the last 168 hours. No results for input(s): "TROPIPOC" in the last 168 hours.   BNPNo results for input(s): "BNP", "PROBNP" in the last 168 hours.   DDimer No results for input(s): "DDIMER" in the last 168 hours.   Radiology    No results found.  Cardiac Studies   RHC 06/29/2021: Conclusions: Moderately elevated left heart filling pressure (PCWP 28 mmHg). Moderately to severely elevated right heart and pulmonary artery pressures (mean RA 13 mmHg, RVEDP 15 mmHg, mean PAP 41 mmHg). Normal Fick cardiac output (Fick CO 4.5 L/min, CI 2.7 L/min/m^2) with low central venous oxygen saturation (SvO2 51%). Successful placement of 60F triple lumen catheter via the right internal jugular vein.   Recommendations: Transfer to stepdown for initiation of milrinone. Continue furosemide infusion; dose escalation will likely be needed based on response to milrinone. Monitor CVP every 4  hours and daily central venous oxygen saturations. __________  2D echo 06/26/2021: 1. Left ventricular ejection fraction, by estimation, is 25 to 30%. The  left ventricle has severely decreased function. The left ventricle  demonstrates global hypokinesis. The left ventricular internal cavity size  was mildly dilated. Left ventricular  diastolic parameters are consistent with Grade II diastolic dysfunction  (pseudonormalization). The average left ventricular global longitudinal  strain is 9.0 %. The global longitudinal strain is abnormal.   2. Right ventricular systolic function is normal. The right ventricular  size is normal. Tricuspid regurgitation signal is inadequate for assessing  PA pressure.   3. Left atrial size was mildly dilated.   4. A small pericardial effusion is present. The pericardial effusion is  circumferential. There is no evidence of cardiac tamponade. Moderate  pleural effusion in the left lateral region.   5. The mitral valve is normal in structure. Moderate mitral valve  regurgitation. No evidence of mitral stenosis.   6. The aortic valve is tricuspid. Aortic valve regurgitation is not  visualized. No aortic stenosis is present.   7. The inferior vena cava is dilated in size with >50% respiratory  variability, suggesting right atrial pressure of 8 mmHg. __________  2D echo 01/22/2021: 1. Left ventricular ejection fraction, by estimation, is 40 to 45%. The  left ventricle has mildly decreased function. The left ventricle  demonstrates global hypokinesis. There is severe left ventricular  hypertrophy. Left ventricular diastolic parameters   are consistent with Grade I diastolic dysfunction (impaired relaxation)  Shiny speckled appearing myocardium with severe LVH, mild valve  thickening, small pericardial effusion. Findings could suggest cardiac  amyolidosis.   2. Right ventricular systolic function is normal. The right ventricular  size is normal. Tricuspid  regurgitation signal is inadequate for assessing  PA pressure.   3. A small pericardial effusion is present. The pericardial effusion is  circumferential.   4. The mitral valve is normal in structure. No evidence of mitral valve  regurgitation. No evidence of mitral stenosis.   5. The aortic valve is tricuspid. There is mild calcification of the  aortic valve. There is mild thickening of the aortic valve. Aortic valve  regurgitation is not visualized. No aortic stenosis is present.   6. The inferior vena cava is normal in size with greater than 50%  respiratory variability, suggesting right atrial pressure of 3 mmHg. __________  2D echo 05/11/2018: 1. The left ventricle has severely reduced systolic function, with an  ejection fraction of 25-30%. The cavity size was mildly dilated. There is  mildly increased left ventricular wall thickness. Left ventricular  diastolic Doppler parameters are  consistent with pseudonormalization. Left ventrical global hypokinesis  without regional wall motion abnormalities.   2. The right ventricle has normal systolic function. The cavity was  normal. There is no increase in right ventricular wall thickness.Unable to  estimate RVSP   3. Left atrial size was mildly dilated.   4. Mitral valve regurgitation is mild to moderate  __________  2D echo 04/29/2017: - Left ventricle: The cavity size was normal. There was moderate    concentric hypertrophy. Systolic function was severely reduced.    The estimated ejection fraction was in the range of 20% to 25%.    Diffuse hypokinesis. Regional wall motion abnormalities cannot be    excluded. Doppler parameters are consistent with abnormal left    ventricular relaxation (grade 1 diastolic dysfunction).  - Left atrium: The atrium was normal in size.  - Right ventricle: Systolic function was normal.  - Pulmonary arteries: Systolic pressure could not be accurately    estimated.  Patient Profile     66 y.o. male  with history of chronic HFrEF due to reported NICM, CKD stage III, DM 2, CVA, and HTN admitted with acute on chronic HFrEF.  Assessment & Plan    1.  Acute on chronic HFrEF due to suspected NICM: -Volume status and symptoms are significantly improved -Discontinue milrinone given Co. Ox -Continue Lasix drip with close monitoring of urine output and renal function off inotropic support -Continue current dose carvedilol with titration of hydralazine to 50 mg 3 times daily and Isordil to 20 mg 3 times daily -Escalate GDMT as tolerated  2.  Acute on CKD stage III: -Renal function improving -Monitor off milrinone  3.  HTN: -Blood pressure mildly elevated -Escalation of medical therapy as outlined above       For questions or updates, please contact [place] Please consult [email or web address] for contact info under Cardiology/STEMI.    Signed, Christell Faith, PA-C [place] Pager: [phone] 07/03/2021, 10:32 AM

## 2021-07-04 DIAGNOSIS — I5023 Acute on chronic systolic (congestive) heart failure: Secondary | ICD-10-CM | POA: Diagnosis not present

## 2021-07-04 DIAGNOSIS — I5043 Acute on chronic combined systolic (congestive) and diastolic (congestive) heart failure: Secondary | ICD-10-CM

## 2021-07-04 DIAGNOSIS — I10 Essential (primary) hypertension: Secondary | ICD-10-CM | POA: Diagnosis not present

## 2021-07-04 DIAGNOSIS — N189 Chronic kidney disease, unspecified: Secondary | ICD-10-CM | POA: Diagnosis not present

## 2021-07-04 DIAGNOSIS — N179 Acute kidney failure, unspecified: Secondary | ICD-10-CM | POA: Diagnosis not present

## 2021-07-04 LAB — GLUCOSE, CAPILLARY
Glucose-Capillary: 359 mg/dL — ABNORMAL HIGH (ref 70–99)
Glucose-Capillary: 320 mg/dL — ABNORMAL HIGH (ref 70–99)
Glucose-Capillary: 222 mg/dL — ABNORMAL HIGH (ref 70–99)
Glucose-Capillary: 116 mg/dL — ABNORMAL HIGH (ref 70–99)

## 2021-07-04 LAB — BASIC METABOLIC PANEL
Anion gap: 8 (ref 5–15)
BUN: 59 mg/dL — ABNORMAL HIGH (ref 8–23)
CO2: 27 mmol/L (ref 22–32)
Calcium: 8.8 mg/dL — ABNORMAL LOW (ref 8.9–10.3)
Chloride: 101 mmol/L (ref 98–111)
Creatinine, Ser: 3.4 mg/dL — ABNORMAL HIGH (ref 0.61–1.24)
GFR, Estimated: 19 mL/min — ABNORMAL LOW (ref >60)
Glucose, Bld: 431 mg/dL — ABNORMAL HIGH (ref 70–99)
Potassium: 4.2 mmol/L (ref 3.5–5.1)
Sodium: 136 mmol/L (ref 135–145)

## 2021-07-04 MED ORDER — INSULIN ASPART 100 UNIT/ML IJ SOLN
6.0000 [IU] | Freq: Three times a day (TID) | INTRAMUSCULAR | Status: DC
  Administered 2021-07-04 – 2021-07-07 (×10): 6 [IU] via SUBCUTANEOUS
  Filled 2021-07-04 (×10): qty 1

## 2021-07-04 MED ORDER — INSULIN GLARGINE-YFGN 100 UNIT/ML ~~LOC~~ SOLN
10.0000 [IU] | Freq: Two times a day (BID) | SUBCUTANEOUS | Status: DC
Start: 2021-07-04 — End: 2021-07-07
  Administered 2021-07-04 – 2021-07-07 (×7): 10 [IU] via SUBCUTANEOUS
  Filled 2021-07-04 (×8): qty 0.1

## 2021-07-04 MED ORDER — HYDRALAZINE HCL 50 MG PO TABS
75.0000 mg | ORAL_TABLET | Freq: Three times a day (TID) | ORAL | Status: DC
  Administered 2021-07-04 – 2021-07-07 (×10): 75 mg via ORAL
  Filled 2021-07-04 (×3): qty 2
  Filled 2021-07-04: qty 1
  Filled 2021-07-04: qty 2
  Filled 2021-07-04: qty 1
  Filled 2021-07-04: qty 2
  Filled 2021-07-04 (×2): qty 1
  Filled 2021-07-04: qty 2
  Filled 2021-07-04: qty 1

## 2021-07-04 NOTE — Progress Notes (Signed)
Progress Note  Patient Name: Shawn [name] Date of Encounter: 07/04/2021  Primary Cardiologist: Rockey Situ  Subjective   Feels improved. Has been able to ambulate in the hall on occasion. Swelling much improved. He relates all of this to being told to stop his diuretic by the [facility] after his last discharge. Continues to feel that he is making good urine since milrinone stopped.   Inpatient Medications    Scheduled Meds:  aspirin  81 mg Oral Daily   carvedilol  3.125 mg Oral BID WC   Chlorhexidine Gluconate Cloth  6 each Topical Daily   heparin injection (subcutaneous)  5,000 Units Subcutaneous Q8H   hydrALAZINE  50 mg Oral Q8H   insulin aspart  0-15 Units Subcutaneous TID WC   insulin aspart  6 Units Subcutaneous TID WC   insulin glargine-yfgn  10 Units Subcutaneous BID   isosorbide dinitrate  20 mg Oral Q8H   melatonin  5 mg Oral QHS   nicotine  21 mg Transdermal Daily   sodium chloride flush  3 mL Intravenous Q12H   Continuous Infusions:  sodium chloride Stopped (07/03/21 1417)   furosemide (LASIX) 200 mg in dextrose 5 % 100 mL (2 mg/mL) infusion 8 mg/hr (07/04/21 1000)   PRN Meds: sodium chloride, acetaminophen, docusate sodium, polyethylene glycol, simethicone, sodium chloride flush, traMADol   Vital Signs    Vitals:   07/04/21 0417 07/04/21 0600 07/04/21 0800 07/04/21 1000  BP:  (!) 173/73 (!) 149/87 (!) 154/92  Pulse:  83 78 74  Resp:  16 15 17   Temp:   (!) 97.5 F (36.4 C)   TempSrc:   Oral   SpO2: 92%  96% 100%  Weight: 70.1 kg     Height:        Intake/Output Summary (Last 24 hours) at 07/04/2021 1147 Last data filed at 07/04/2021 1140 Gross per 24 hour  Intake 992.16 ml  Output 3400 ml  Net -2407.84 ml   Filed Weights   07/02/21 0500 07/03/21 0400 07/04/21 0417  Weight: 72.9 kg 70.4 kg 70.1 kg    Telemetry    NSR, rare PVC - Personally Reviewed  ECG    No new tracings - Personally Reviewed  Physical Exam   GEN: Well nourished, well  developed in no acute distress NECK: No JVD CARDIAC: regular rhythm, normal S1 and S2, no rubs or gallops. 2/6 systolic murmur. VASCULAR: Radial pulses 2+ bilaterally.  RESPIRATORY:  Clear to auscultation in upper fields, minimal rales at bilateral bases ABDOMEN: Soft, non-tender, non-distended MUSCULOSKELETAL:  Moves all 4 limbs independently SKIN: Warm and dry, bilateral trivial-1+ LE pitting edema NEUROLOGIC:  No focal neuro deficits noted. PSYCHIATRIC:  Normal affect    Labs    Chemistry Recent Labs  Lab 07/02/21 0513 07/03/21 0408 07/04/21 0448  NA 139 137 136  K 4.0 4.1 4.2  CL 104 102 101  CO2 26 26 27   GLUCOSE 195* 227* 431*  BUN 53* 52* 59*  CREATININE 3.75* 3.41* 3.40*  CALCIUM 8.4* 8.9 8.8*  GFRNONAA 17* 19* 19*  ANIONGAP 9 9 8      Hematology Recent Labs  Lab 07/01/21 0451 07/02/21 0513 07/03/21 0408  WBC 6.3 6.1 6.2  RBC 3.00* 2.91* 3.11*  HGB 8.6* 8.5* 8.9*  HCT 26.9* 26.3* 28.1*  MCV 89.7 90.4 90.4  MCH 28.7 29.2 28.6  MCHC 32.0 32.3 31.7  RDW 14.8 14.8 15.2  PLT 344 378 388    Cardiac EnzymesNo results for input(s): "TROPONINI"  in the last 168 hours. No results for input(s): "TROPIPOC" in the last 168 hours.   BNPNo results for input(s): "BNP", "PROBNP" in the last 168 hours.   DDimer No results for input(s): "DDIMER" in the last 168 hours.   Radiology    No results found.  Cardiac Studies   RHC 06/29/2021: Conclusions: Moderately elevated left heart filling pressure (PCWP 28 mmHg). Moderately to severely elevated right heart and pulmonary artery pressures (mean RA 13 mmHg, RVEDP 15 mmHg, mean PAP 41 mmHg). Normal Fick cardiac output (Fick CO 4.5 L/min, CI 2.7 L/min/m^2) with low central venous oxygen saturation (SvO2 51%). Successful placement of 22F triple lumen catheter via the right internal jugular vein.   Recommendations: Transfer to stepdown for initiation of milrinone. Continue furosemide infusion; dose escalation will likely  be needed based on response to milrinone. Monitor CVP every 4 hours and daily central venous oxygen saturations. __________  2D echo 06/26/2021: 1. Left ventricular ejection fraction, by estimation, is 25 to 30%. The  left ventricle has severely decreased function. The left ventricle  demonstrates global hypokinesis. The left ventricular internal cavity size  was mildly dilated. Left ventricular  diastolic parameters are consistent with Grade II diastolic dysfunction  (pseudonormalization). The average left ventricular global longitudinal  strain is 9.0 %. The global longitudinal strain is abnormal.   2. Right ventricular systolic function is normal. The right ventricular  size is normal. Tricuspid regurgitation signal is inadequate for assessing  PA pressure.   3. Left atrial size was mildly dilated.   4. A small pericardial effusion is present. The pericardial effusion is  circumferential. There is no evidence of cardiac tamponade. Moderate  pleural effusion in the left lateral region.   5. The mitral valve is normal in structure. Moderate mitral valve  regurgitation. No evidence of mitral stenosis.   6. The aortic valve is tricuspid. Aortic valve regurgitation is not  visualized. No aortic stenosis is present.   7. The inferior vena cava is dilated in size with >50% respiratory  variability, suggesting right atrial pressure of 8 mmHg. __________  2D echo 01/22/2021: 1. Left ventricular ejection fraction, by estimation, is 40 to 45%. The  left ventricle has mildly decreased function. The left ventricle  demonstrates global hypokinesis. There is severe left ventricular  hypertrophy. Left ventricular diastolic parameters   are consistent with Grade I diastolic dysfunction (impaired relaxation)  Shiny speckled appearing myocardium with severe LVH, mild valve  thickening, small pericardial effusion. Findings could suggest cardiac  amyolidosis.   2. Right ventricular systolic function  is normal. The right ventricular  size is normal. Tricuspid regurgitation signal is inadequate for assessing  PA pressure.   3. A small pericardial effusion is present. The pericardial effusion is  circumferential.   4. The mitral valve is normal in structure. No evidence of mitral valve  regurgitation. No evidence of mitral stenosis.   5. The aortic valve is tricuspid. There is mild calcification of the  aortic valve. There is mild thickening of the aortic valve. Aortic valve  regurgitation is not visualized. No aortic stenosis is present.   6. The inferior vena cava is normal in size with greater than 50%  respiratory variability, suggesting right atrial pressure of 3 mmHg. __________  2D echo 05/11/2018: 1. The left ventricle has severely reduced systolic function, with an  ejection fraction of 25-30%. The cavity size was mildly dilated. There is  mildly increased left ventricular wall thickness. Left ventricular  diastolic Doppler parameters are  consistent with pseudonormalization. Left ventrical global hypokinesis  without regional wall motion abnormalities.   2. The right ventricle has normal systolic function. The cavity was  normal. There is no increase in right ventricular wall thickness.Unable to  estimate RVSP   3. Left atrial size was mildly dilated.   4. Mitral valve regurgitation is mild to moderate  __________  2D echo 04/29/2017: - Left ventricle: The cavity size was normal. There was moderate    concentric hypertrophy. Systolic function was severely reduced.    The estimated ejection fraction was in the range of 20% to 25%.    Diffuse hypokinesis. Regional wall motion abnormalities cannot be    excluded. Doppler parameters are consistent with abnormal left    ventricular relaxation (grade 1 diastolic dysfunction).  - Left atrium: The atrium was normal in size.  - Right ventricle: Systolic function was normal.  - Pulmonary arteries: Systolic pressure could not be  accurately    estimated.  Patient Profile     66 y.o. male with history of chronic HFrEF due to reported NICM, CKD stage III, DM 2, CVA, and HTN admitted with acute on chronic HFrEF.  Assessment & Plan    1.  Acute on chronic HFrEF due to suspected NICM: -Continue Lasix drip with close monitoring of urine output and renal function off inotropic support -Continue 3.125 mg BID carvedilol -increasing hydralazine to 75 mg 3 times daily -continue Isordil 20 mg 3 times daily -cannot use ACEi/ARB/ARNI/MRA/SGLT2i given renal function -initial weight on 6/20 incorrect. Peak weight 76.6 kg, current weight 70.1 kg, net negative ~9 L -CVP 11 today  2.  Acute on CKD stage IIIa: -Cr 05/14/21 was 1.41, GFR 56, consistent with CKD stage 3a -Peak Cr this admission 4.26, current 3.40, still significantly over recent baseline. Admission Cr 1.27  3.  HTN: -Blood pressure remains above goal -Escalation of medical therapy as outlined above  4. Type II diabetes -with current renal function, cannot use ACEi/ARB/ARNI/MRA/SGLT2i   He continues to require ICU level care for monitoring of CVP. Has required inotropes this admission. Coox pending today.  For questions or updates, please contact [place] Please consult [email or web address] for contact info under Cardiology/STEMI.    Signed, Buford Dresser, MD, PhD, [facility] at [facility] at [facility] [address] [place], [place] [address] [phone]  07/04/2021, 11:47 AM

## 2021-07-04 NOTE — Assessment & Plan Note (Signed)
Worsening of creatinine with diuresis, also received IV contrast.  Concern of cardiorenal syndrome.  Baseline around 1.27, peaked at 4.26 and now with slight improvement, still above baseline. Nephrology is on board. -Continue to monitor -Avoid nephrotoxins -Continue to hold Entresto -Strict intake and output

## 2021-07-04 NOTE — Progress Notes (Signed)
Dr. Harrell Gave gave order to d/c CVP monitoring.

## 2021-07-04 NOTE — Assessment & Plan Note (Signed)
Apparently patient had an history of hypoglycemia and discharge home without any insulin during prior hospitalization at [facility].  Then went to [facility] and found to have hyperglycemia and started on 20 units of glargine daily. Significant hyperglycemia with CBG remained above 300-400. -Increase Semglee to 10 units twice daily -Increase mealtime coverage to 6 unit -Continue with SSI

## 2021-07-04 NOTE — Progress Notes (Signed)
[facility]  ROUNDING NOTE   Subjective:   Shawn [name] is a 66 y.o. AA male with past medical conditions of CVA, diabetes, CHF, hypertension, and chronic kidney disease Iib. Patient presented to ED with lower extremity swelling and scrotal. Patient is admitted for Anasarca [R60.1] Testicle swelling [N50.89] Acute on chronic congestive heart failure, unspecified heart failure type [facility]) [I50.9]  Patient is known to our clinic and is followed by Dr Candiss Norse.   Update: Patient off of milrinone drip. Still on Lasix drip. Creatinine down to 3.4. Urine output 3.2 L over the preceding 24 hours.   Objective:  Vital signs in last 24 hours:  Temp:  [97.4 F (36.3 C)-97.9 F (36.6 C)] 97.5 F (36.4 C) (07/01 0800) Pulse Rate:  [70-85] 78 (07/01 0800) Resp:  [13-21] 15 (07/01 0800) BP: (115-173)/(73-97) 149/87 (07/01 0800) SpO2:  [92 %-96 %] 96 % (07/01 0800) Weight:  [70.1 kg] 70.1 kg (07/01 0417)  Weight change: -0.3 kg Filed Weights   07/02/21 0500 07/03/21 0400 07/04/21 0417  Weight: 72.9 kg 70.4 kg 70.1 kg    Intake/Output: I/O last 3 completed shifts: In: 1195.6 [P.O.:910; I.V.:285.6] Out: 5040 [[id number]]   Intake/Output this shift:  No intake/output data recorded.  Physical Exam: General: NAD  Head: Normocephalic, atraumatic. Moist oral mucosal membranes  Eyes: Anicteric  Lungs:  Clear to auscultation, normal effort  Heart: Regular rate and rhythm  Abdomen:  Soft, nontender  Extremities: Trace bilateral lower extremity edema  Neurologic: Nonfocal, moving all four extremities  Skin: No lesions  Access: None    Basic Metabolic Panel: Recent Labs  Lab 06/29/21 0418 06/30/21 0447 07/01/21 0451 07/02/21 0513 07/03/21 0408 07/04/21 0448  NA 135 136 136 139 137 136  K 4.3 4.1 4.0 4.0 4.1 4.2  CL 104 106 104 104 102 101  CO2 22 24 23 26 26 27   GLUCOSE 171* 265* 115* 195* 227* 431*  BUN 54* 55* 55* 53* 52* 59*  CREATININE 4.26* 3.98*  3.93* 3.75* 3.41* 3.40*  CALCIUM 8.6* 8.3* 8.5* 8.4* 8.9 8.8*  MG 2.0 2.0 1.7 2.3 1.8  --      Liver Function Tests: No results for input(s): "AST", "ALT", "ALKPHOS", "BILITOT", "PROT", "ALBUMIN" in the last 168 hours.  No results for input(s): "LIPASE", "AMYLASE" in the last 168 hours. No results for input(s): "AMMONIA" in the last 168 hours.  CBC: Recent Labs  Lab 06/29/21 0418 06/30/21 0447 07/01/21 0451 07/02/21 0513 07/03/21 0408  WBC 5.3 5.2 6.3 6.1 6.2  HGB 9.3* 8.9* 8.6* 8.5* 8.9*  HCT 29.6* 27.7* 26.9* 26.3* 28.1*  MCV 90.2 90.5 89.7 90.4 90.4  PLT 360 331 344 378 388     Cardiac Enzymes: No results for input(s): "CKTOTAL", "CKMB", "CKMBINDEX", "TROPONINI" in the last 168 hours.  BNP: Invalid input(s): "POCBNP"  CBG: Recent Labs  Lab 07/03/21 0725 07/03/21 1130 07/03/21 1539 07/03/21 2127 07/04/21 0741  GLUCAP 199* 163* 182* 287* 359*     Microbiology: Results for orders placed or performed during the hospital encounter of 06/23/21  Urine Culture     Status: Abnormal   Collection Time: 06/23/21 11:10 AM   Specimen: Urine, Clean Catch  Result Value Ref Range Status   Specimen Description   Final    URINE, CLEAN CATCH Performed at [facility], [address], [place], [place] [address]    Special Requests   Final    NONE Performed at [facility], [place],  [place] [address]    Culture MULTIPLE SPECIES PRESENT, SUGGEST RECOLLECTION (A)  Final   Report Status 06/24/2021 FINAL  Final  Body fluid culture w Gram Stain     Status: None   Collection Time: 06/25/21 11:29 AM   Specimen: Peritoneal Washings  Result Value Ref Range Status   Specimen Description   Final    PERITONEAL Performed at [facility], [address], [place], [place] [address]    Special Requests   Final    NONE Performed at [facility], [place], [place] [address]    Gram Stain NO WBC SEEN NO  ORGANISMS SEEN   Final   Culture   Final    NO GROWTH 3 DAYS Performed at [facility], [place] [address] [place], [place] [address]    Report Status 06/28/2021 FINAL  Final  MRSA Next Gen by PCR, Nasal     Status: None   Collection Time: 06/29/21  3:12 PM   Specimen: Nasal Mucosa; Nasal Swab  Result Value Ref Range Status   MRSA by PCR Next Gen NOT DETECTED NOT DETECTED Final    Comment: (NOTE) The GeneXpert MRSA Assay (FDA approved for NASAL specimens only), is one component of a comprehensive MRSA colonization surveillance program. It is not intended to diagnose MRSA infection nor to guide or monitor treatment for MRSA infections. Test performance is not FDA approved in patients less than 8 years old. Performed at [facility], [address] [place], [place] [address]     Coagulation Studies: No results for input(s): "LABPROT", "INR" in the last 72 hours.  Urinalysis: No results for input(s): "COLORURINE", "LABSPEC", "PHURINE", "GLUCOSEU", "HGBUR", "BILIRUBINUR", "KETONESUR", "PROTEINUR", "UROBILINOGEN", "NITRITE", "LEUKOCYTESUR" in the last 72 hours.  Invalid input(s): "APPERANCEUR"    Imaging: No results found.   Medications:    sodium chloride Stopped (07/03/21 1417)   furosemide (LASIX) 200 mg in dextrose 5 % 100 mL (2 mg/mL) infusion 8 mg/hr (07/04/21 0800)    aspirin  81 mg Oral Daily   carvedilol  3.125 mg Oral BID WC   Chlorhexidine Gluconate Cloth  6 each Topical Daily   heparin injection (subcutaneous)  5,000 Units Subcutaneous Q8H   hydrALAZINE  50 mg Oral Q8H   insulin aspart  0-15 Units Subcutaneous TID WC   insulin aspart  6 Units Subcutaneous TID WC   insulin glargine-yfgn  10 Units Subcutaneous BID   isosorbide dinitrate  20 mg Oral Q8H   melatonin  5 mg Oral QHS   nicotine  21 mg Transdermal Daily   sodium chloride flush  3 mL Intravenous Q12H   sodium chloride, acetaminophen, docusate sodium, polyethylene glycol, simethicone,  sodium chloride flush, traMADol  Assessment/ Plan:  Shawn [name] is a 66 y.o.  male with past medical conditions of CVA, diabetes, CHF, hypertension, and chronic kidney disease IIib. Patient presented to ED with lower extremity swelling and scrotal. Patient is admitted for Anasarca [R60.1] Testicle swelling [N50.89] Acute on chronic congestive heart failure, unspecified heart failure type ([place]) [I50.9]   Acute Kidney Injury on chronic kidney disease stage IIIb with baseline creatinine 1.27 06/23/21.  Acute kidney injury likely secondary to IV contrast exposure and fluid shift as well as cardiorenal syndrome.  Creatinine down to 3.4.  Good urine output at 3.2 L.  Okay to continue Lasix drip for now.  Lab Results  Component Value Date   CREATININE 3.40 (H) 07/04/2021   CREATININE 3.41 (H) 07/03/2021  CREATININE 3.75 (H) 07/02/2021    Intake/Output Summary (Last 24 hours) at 07/04/2021 1036 Last data filed at 07/04/2021 0520 Gross per 24 hour  Intake 735.91 ml  Output 2575 ml  Net -1839.09 ml    2. Hypertension with chronic kidney disease.  Home regimen includes Carvedilol and entresto. Entresto currently held.   Continue carvedilol, hydralazine, and Lasix drip.   3. 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 Home regimen includes NovoLog and glargineMost recent hemoglobin A1c is 11.9 on 06/24/21.    4.anemia of chronic kidney disease with iron deficiency.   Latest Reference Range & Units 06/27/21 07:01  Iron 45 - 182 ug/dL 24 (L)  UIBC ug/dL 210  TIBC 250 - 450 ug/dL 234 (L)  Saturation Ratios 17.9 - 39.5 % 10 (L)  Ferritin 24 - 336 ng/mL 59  Folate >5.9 ng/mL 25.0  (L): Data is abnormally low  Consider Epogen as an outpatient.  5. Acute urinary retention Resolved with foley placement  6.  Acute on chronic systolic heart failure. ECHO on 06/26/21 shows EF 25-30% global hypokinesis, grade 2 diastolic dysfunction, moderate mitral  regurgitation, and small pericardial effusion.   Right heart cath on 06/29/21, found to have elevated left heart filling pressure and moderately to severely elevated right heart and pulmonary artery pressures.  Patient was initially on milrinone and now transitioned off.  Continue Lasix drip per cardiology.    LOS: 10 Nachman Sundt 7/1/202310:36 AM

## 2021-07-04 NOTE — Assessment & Plan Note (Signed)
Patient admitted for concern of acute on chronic HFrEF, repeat echocardiogram with worsening of EF to 25 to 30%.  Cardiology is on board. Significant elevated BNP at 2973. Patient was started on milrinone and Lasix infusion with improvement in diuresis and edema. -Milrinone was stopped on 07/03/2021.  Continues to have good urinary output. -Continue Lasix infusion -Continue with daily BMP and weight -Strict intake and output

## 2021-07-04 NOTE — Assessment & Plan Note (Signed)
Improving. Nephrology and cardiology on board.   -Continue to monitor

## 2021-07-04 NOTE — Progress Notes (Signed)
Progress Note   Patient: Shawn [name] PPJ:[id number] DOB: [birth year] DOA: 06/23/2021     10 DOS: the patient was seen and examined on 07/04/2021   Brief hospital course: Taken from prior notes.  Shawn [name] is a 66 y.o. male with medical history significant of poorly controlled t2dm, ckd 3b, combined CHF, smoking, alcohol abuse, hypertension, prior CVA, malnutrition who presented scrotal and leg swelling.  Home torsemide and Aldactone were held after hospitalization in May 2023 for DKA and AKI, and not resumed due to no PCP followup (per pt, PCP wanted him to find another doctor). --on current presentation, BNP 2973, imaging studies showed pulm edema, ascites, scrotal edema.  Also has BLE and pedal edema.  Swelling was severe and limiting his movements.  Also has small bilateral pleural effusions.  home torsemide and Aldactone were held after hospitalization in May 2023 for DKA and AKI, and not resumed due to no PCP followup.  Current Echo showed LVEF had worsened from 40-45% to 25-30% in just 5 months.  Cardiology consulted.  Right heart cath on 6/26, found to have Moderately to severely elevated right heart and pulmonary artery pressures.  Due to lack of response to regular diuresis, patient was transferred to stepdown and started on  milrinone and Lasix infusion.  6/28: Started having good diuresis, total of 2615 mL of her output recorded.  Renal function seems stable, creatinine at 3.93 today.  Cardiology is planning to continue milrinone and Lasix infusion at this time.  Clinically improving.  6/29: Patient remained stable, mildly elevated blood pressure, milrinone drip rate was decreased.  Cardiology would like to continue Lasix infusion.  Net 2.4 L over the past 24 hours.  Creatinine with some improvement.  6/30: Patient continued to improve.  Improving renal function.  Milrinone was discontinued and plan is to continue heparin infusion for now.  Hydralazine and isosorbide  dinitrate dose was increased due to persistently elevated blood pressure. Cardiology and nephrology is on board.  7/1: Continue to have good diuresis after discontinuing milrinone.  Total output of 3225 in the past 24 hours with total net of -8526. Significant hyperglycemia with CBG remained about 300-400.  Increasing mealtime coverage to 6 unit and changing Semglee to 10 units twice daily.  Renal function stable. We will remove Foley catheter today to give him a voiding trial.  Continue to have strict intake and output.  Cardiology is going to continue with Lasix infusion for now   Assessment and Plan: * Acute on chronic HFrEF (heart failure with reduced ejection fraction) ([place]) Patient admitted for concern of acute on chronic HFrEF, repeat echocardiogram with worsening of EF to 25 to 30%.  Cardiology is on board. Significant elevated BNP at 2973. Patient was started on milrinone and Lasix infusion with improvement in diuresis and edema. -Milrinone was stopped on 07/03/2021.  Continues to have good urinary output. -Continue Lasix infusion -Continue with daily BMP and weight -Strict intake and output  Anasarca Improving. Nephrology and cardiology on board.   -Continue to monitor   Acute kidney injury superimposed on CKD 3b ([place]) Worsening of creatinine with diuresis, also received IV contrast.  Concern of cardiorenal syndrome.  Baseline around 1.27, peaked at 4.26 and now with slight improvement, still above baseline. Nephrology is on board. -Continue to monitor -Avoid nephrotoxins -Continue to hold Entresto -Strict intake and output  Type 2 diabetes mellitus with stage 3b chronic kidney disease, with long-term current use of insulin ([name]) Apparently patient had an history of  hypoglycemia and discharge home without any insulin during prior hospitalization at [facility].  Then went to [facility] and found to have hyperglycemia and started on 20 units of glargine daily. Significant hyperglycemia  with CBG remained above 300-400. -Increase Semglee to 10 units twice daily -Increase mealtime coverage to 6 unit -Continue with SSI  Essential hypertension Blood pressure remained elevated. -Continue Coreg. -Keep holding Entresto due to AKI -Continue Lasix infusion -Continue Isordil  -Hydralazine was increased to 75 3 times daily   Iron deficiency anemia --s/p IV iron --plan to discharge on oral iron supplement.    Subjective: Patient was seen and examined today.  No new complaints.  He wants to go home.  We discussed about removing the Foley catheter and giving him a voiding trial.  Physical Exam: Vitals:   07/04/21 0800 07/04/21 1000 07/04/21 1200 07/04/21 1400  BP: (!) 149/87 (!) 154/92 (!) 155/92 (!) 154/92  Pulse: 78 74 77 84  Resp: 15 17 12 17   Temp: (!) 97.5 F (36.4 C)  98 F (36.7 C)   TempSrc: Oral  Oral   SpO2: 96% 100% 99% 99%  Weight:      Height:       General.     In no acute distress. Pulmonary.  Lungs clear bilaterally, normal respiratory effort. CV.  Regular rate and rhythm, no JVD, rub or murmur. Abdomen.  Soft, nontender, nondistended, BS positive. CNS.  Alert and oriented .  No focal neurologic deficit. Extremities.  No edema, no cyanosis, pulses intact and symmetrical. Psychiatry.  Judgment and insight appears normal.  Data Reviewed: Prior data reviewed.  Family Communication:   Disposition: Status is: Inpatient Remains inpatient appropriate because: Severity of illness, on Lasix infusion.   Planned Discharge Destination: Home  DVT prophylaxis.  Subcu heparin Time spent: 45 minutes  This record has been created using Systems analyst. Errors have been sought and corrected,but may not always be located. Such creation errors do not reflect on the standard of care.  Author: Lorella Nimrod, MD 07/04/2021 3:25 PM  For on call review [email or web address].

## 2021-07-04 NOTE — Assessment & Plan Note (Signed)
Blood pressure remained elevated. -Continue Coreg. -Keep holding Entresto due to AKI -Continue Lasix infusion -Continue Isordil  -Hydralazine was increased to 75 3 times daily

## 2021-07-05 DIAGNOSIS — I10 Essential (primary) hypertension: Secondary | ICD-10-CM | POA: Diagnosis not present

## 2021-07-05 DIAGNOSIS — N189 Chronic kidney disease, unspecified: Secondary | ICD-10-CM | POA: Diagnosis not present

## 2021-07-05 DIAGNOSIS — N179 Acute kidney failure, unspecified: Secondary | ICD-10-CM | POA: Diagnosis not present

## 2021-07-05 DIAGNOSIS — I5043 Acute on chronic combined systolic (congestive) and diastolic (congestive) heart failure: Secondary | ICD-10-CM | POA: Diagnosis not present

## 2021-07-05 DIAGNOSIS — I5023 Acute on chronic systolic (congestive) heart failure: Secondary | ICD-10-CM | POA: Diagnosis not present

## 2021-07-05 LAB — BASIC METABOLIC PANEL
Anion gap: 7 (ref 5–15)
BUN: 55 mg/dL — ABNORMAL HIGH (ref 8–23)
CO2: 29 mmol/L (ref 22–32)
Calcium: 9.1 mg/dL (ref 8.9–10.3)
Chloride: 101 mmol/L (ref 98–111)
Creatinine, Ser: 3.18 mg/dL — ABNORMAL HIGH (ref 0.61–1.24)
GFR, Estimated: 21 mL/min — ABNORMAL LOW (ref >60)
Glucose, Bld: 64 mg/dL — ABNORMAL LOW (ref 70–99)
Potassium: 3.7 mmol/L (ref 3.5–5.1)
Sodium: 137 mmol/L (ref 135–145)

## 2021-07-05 LAB — GLUCOSE, CAPILLARY
Glucose-Capillary: 84 mg/dL (ref 70–99)
Glucose-Capillary: 139 mg/dL — ABNORMAL HIGH (ref 70–99)
Glucose-Capillary: 138 mg/dL — ABNORMAL HIGH (ref 70–99)
Glucose-Capillary: 150 mg/dL — ABNORMAL HIGH (ref 70–99)

## 2021-07-05 NOTE — Progress Notes (Signed)
Report given to [facility] and patient transported to 2A room 233.

## 2021-07-05 NOTE — Progress Notes (Signed)
Progress Note  Patient Name: Shawn [name] Date of Encounter: 07/05/2021  Primary Cardiologist: Rockey Situ  Subjective   Feels like he is getting better every day. Swelling continues to improve. Good urine output on lasix drip.  Inpatient Medications    Scheduled Meds:  aspirin  81 mg Oral Daily   carvedilol  3.125 mg Oral BID WC   Chlorhexidine Gluconate Cloth  6 each Topical Daily   heparin injection (subcutaneous)  5,000 Units Subcutaneous Q8H   hydrALAZINE  75 mg Oral Q8H   insulin aspart  0-15 Units Subcutaneous TID WC   insulin aspart  6 Units Subcutaneous TID WC   insulin glargine-yfgn  10 Units Subcutaneous BID   isosorbide dinitrate  20 mg Oral Q8H   melatonin  5 mg Oral QHS   nicotine  21 mg Transdermal Daily   sodium chloride flush  3 mL Intravenous Q12H   Continuous Infusions:  sodium chloride Stopped (07/03/21 1417)   furosemide (LASIX) 200 mg in dextrose 5 % 100 mL (2 mg/mL) infusion 8 mg/hr (07/05/21 0942)   PRN Meds: sodium chloride, acetaminophen, docusate sodium, polyethylene glycol, simethicone, sodium chloride flush, traMADol   Vital Signs    Vitals:   07/05/21 0800 07/05/21 0900 07/05/21 0902 07/05/21 1000  BP: 137/88  137/88 134/80  Pulse: 65 73 71 70  Resp: 18 (!) 21  19  Temp:   (!) 97.2 F (36.2 C)   TempSrc:   Oral   SpO2:      Weight:      Height:        Intake/Output Summary (Last 24 hours) at 07/05/2021 1201 Last data filed at 07/05/2021 0813 Gross per 24 hour  Intake 319.37 ml  Output 2736 ml  Net -2416.63 ml   Filed Weights   07/03/21 0400 07/04/21 0417 07/05/21 0500  Weight: 70.4 kg 70.1 kg 75.7 kg    Telemetry    NSR, occasional PVC - Personally Reviewed  ECG    No new tracings - Personally Reviewed  Physical Exam   GEN: thin, well developed in no acute distress NECK: No JVD CARDIAC: regular rhythm, normal S1 and S2, no rubs or gallops. 2/6 systolic murmur. VASCULAR: Radial pulses 2+ bilaterally.   RESPIRATORY:  Clear to auscultation with improvement in basilar rales ABDOMEN: Soft, non-tender, non-distended MUSCULOSKELETAL:  Moves all 4 limbs independently SKIN: Warm and dry, bilateral trivial pitting LE edema NEUROLOGIC:  No focal neuro deficits noted. PSYCHIATRIC:  Normal affect    Labs    Chemistry Recent Labs  Lab 07/03/21 0408 07/04/21 0448 07/05/21 0515  NA 137 136 137  K 4.1 4.2 3.7  CL 102 101 101  CO2 26 27 29   GLUCOSE 227* 431* 64*  BUN 52* 59* 55*  CREATININE 3.41* 3.40* 3.18*  CALCIUM 8.9 8.8* 9.1  GFRNONAA 19* 19* 21*  ANIONGAP 9 8 7      Hematology Recent Labs  Lab 07/01/21 0451 07/02/21 0513 07/03/21 0408  WBC 6.3 6.1 6.2  RBC 3.00* 2.91* 3.11*  HGB 8.6* 8.5* 8.9*  HCT 26.9* 26.3* 28.1*  MCV 89.7 90.4 90.4  MCH 28.7 29.2 28.6  MCHC 32.0 32.3 31.7  RDW 14.8 14.8 15.2  PLT 344 378 388    Cardiac EnzymesNo results for input(s): "TROPONINI" in the last 168 hours. No results for input(s): "TROPIPOC" in the last 168 hours.   BNPNo results for input(s): "BNP", "PROBNP" in the last 168 hours.   DDimer No results for input(s): "DDIMER"  in the last 168 hours.   Radiology    No results found.  Cardiac Studies   RHC 06/29/2021: Conclusions: Moderately elevated left heart filling pressure (PCWP 28 mmHg). Moderately to severely elevated right heart and pulmonary artery pressures (mean RA 13 mmHg, RVEDP 15 mmHg, mean PAP 41 mmHg). Normal Fick cardiac output (Fick CO 4.5 L/min, CI 2.7 L/min/m^2) with low central venous oxygen saturation (SvO2 51%). Successful placement of 1F triple lumen catheter via the right internal jugular vein.   Recommendations: Transfer to stepdown for initiation of milrinone. Continue furosemide infusion; dose escalation will likely be needed based on response to milrinone. Monitor CVP every 4 hours and daily central venous oxygen saturations. __________  2D echo 06/26/2021: 1. Left ventricular ejection fraction, by  estimation, is 25 to 30%. The  left ventricle has severely decreased function. The left ventricle  demonstrates global hypokinesis. The left ventricular internal cavity size  was mildly dilated. Left ventricular  diastolic parameters are consistent with Grade II diastolic dysfunction  (pseudonormalization). The average left ventricular global longitudinal  strain is 9.0 %. The global longitudinal strain is abnormal.   2. Right ventricular systolic function is normal. The right ventricular  size is normal. Tricuspid regurgitation signal is inadequate for assessing  PA pressure.   3. Left atrial size was mildly dilated.   4. A small pericardial effusion is present. The pericardial effusion is  circumferential. There is no evidence of cardiac tamponade. Moderate  pleural effusion in the left lateral region.   5. The mitral valve is normal in structure. Moderate mitral valve  regurgitation. No evidence of mitral stenosis.   6. The aortic valve is tricuspid. Aortic valve regurgitation is not  visualized. No aortic stenosis is present.   7. The inferior vena cava is dilated in size with >50% respiratory  variability, suggesting right atrial pressure of 8 mmHg. __________  2D echo 01/22/2021: 1. Left ventricular ejection fraction, by estimation, is 40 to 45%. The  left ventricle has mildly decreased function. The left ventricle  demonstrates global hypokinesis. There is severe left ventricular  hypertrophy. Left ventricular diastolic parameters   are consistent with Grade I diastolic dysfunction (impaired relaxation)  Shiny speckled appearing myocardium with severe LVH, mild valve  thickening, small pericardial effusion. Findings could suggest cardiac  amyolidosis.   2. Right ventricular systolic function is normal. The right ventricular  size is normal. Tricuspid regurgitation signal is inadequate for assessing  PA pressure.   3. A small pericardial effusion is present. The pericardial  effusion is  circumferential.   4. The mitral valve is normal in structure. No evidence of mitral valve  regurgitation. No evidence of mitral stenosis.   5. The aortic valve is tricuspid. There is mild calcification of the  aortic valve. There is mild thickening of the aortic valve. Aortic valve  regurgitation is not visualized. No aortic stenosis is present.   6. The inferior vena cava is normal in size with greater than 50%  respiratory variability, suggesting right atrial pressure of 3 mmHg. __________  2D echo 05/11/2018: 1. The left ventricle has severely reduced systolic function, with an  ejection fraction of 25-30%. The cavity size was mildly dilated. There is  mildly increased left ventricular wall thickness. Left ventricular  diastolic Doppler parameters are  consistent with pseudonormalization. Left ventrical global hypokinesis  without regional wall motion abnormalities.   2. The right ventricle has normal systolic function. The cavity was  normal. There is no increase in right ventricular  wall thickness.Unable to  estimate RVSP   3. Left atrial size was mildly dilated.   4. Mitral valve regurgitation is mild to moderate  __________  2D echo 04/29/2017: - Left ventricle: The cavity size was normal. There was moderate    concentric hypertrophy. Systolic function was severely reduced.    The estimated ejection fraction was in the range of 20% to 25%.    Diffuse hypokinesis. Regional wall motion abnormalities cannot be    excluded. Doppler parameters are consistent with abnormal left    ventricular relaxation (grade 1 diastolic dysfunction).  - Left atrium: The atrium was normal in size.  - Right ventricle: Systolic function was normal.  - Pulmonary arteries: Systolic pressure could not be accurately    estimated.  Patient Profile     67 y.o. male with history of chronic HFrEF due to reported NICM, CKD stage III, DM 2, CVA, and HTN admitted with acute on chronic  HFrEF.  Assessment & Plan    1.  Acute on chronic HFrEF due to suspected NICM: -Continue Lasix drip with close monitoring of urine output and renal function off inotropic support -Continue 3.125 mg BID carvedilol -increased hydralazine to 75 mg 3 times daily, tolerating well -continue Isordil 20 mg 3 times daily -cannot use ACEi/ARB/ARNI/MRA/SGLT2i given renal function -initial weight on 6/20 incorrect. Peak weight 76.6 kg, 7/1 weight 70.1 kg (today's weight 75.7 kg, suspect this is inaccurate given documented net negative), net negative ~11 L -continue lasix drip today, may be able to transition to bolus vs. PO in near future given improvement  2.  Acute on CKD stage IIIa: -Cr 05/14/21 was 1.41, GFR 56, consistent with CKD stage 3a -Peak Cr this admission 4.26, current 3.18, still significantly over recent baseline. Admission Cr 1.27  3.  HTN: -Blood pressure improved with escalation of medical therapy as outlined above  4. Type II diabetes -with current renal function, cannot use ACEi/ARB/ARNI/MRA/SGLT2i   Pending transfer to PCU, CVP monitoring discontinued.  For questions or updates, please contact [place] Please consult [email or web address] for contact info under Cardiology/STEMI.    Signed, Buford Dresser, MD, PhD, [facility] at [facility] [address] [place],  [address] [phone]  07/05/2021, 12:01 PM

## 2021-07-05 NOTE — Progress Notes (Signed)
Progress Note   Patient: Shawn [name] BJY:[id number] DOB: [birth year] DOA: 06/23/2021     11 DOS: the patient was seen and examined on 07/05/2021   Brief hospital course: Taken from prior notes.  Shawn [name] is a 66 y.o. male with medical history significant of poorly controlled t2dm, ckd 3b, combined CHF, smoking, alcohol abuse, hypertension, prior CVA, malnutrition who presented scrotal and leg swelling.  Home torsemide and Aldactone were held after hospitalization in May 2023 for DKA and AKI, and not resumed due to no PCP followup (per pt, PCP wanted him to find another doctor). --on current presentation, BNP 2973, imaging studies showed pulm edema, ascites, scrotal edema.  Also has BLE and pedal edema.  Swelling was severe and limiting his movements.  Also has small bilateral pleural effusions.  home torsemide and Aldactone were held after hospitalization in May 2023 for DKA and AKI, and not resumed due to no PCP followup.  Current Echo showed LVEF had worsened from 40-45% to 25-30% in just 5 months.  Cardiology consulted.  Right heart cath on 6/26, found to have Moderately to severely elevated right heart and pulmonary artery pressures.  Due to lack of response to regular diuresis, patient was transferred to stepdown and started on  milrinone and Lasix infusion.  6/28: Started having good diuresis, total of 2615 mL of her output recorded.  Renal function seems stable, creatinine at 3.93 today.  Cardiology is planning to continue milrinone and Lasix infusion at this time.  Clinically improving.  6/29: Patient remained stable, mildly elevated blood pressure, milrinone drip rate was decreased.  Cardiology would like to continue Lasix infusion.  Net 2.4 L over the past 24 hours.  Creatinine with some improvement.  6/30: Patient continued to improve.  Improving renal function.  Milrinone was discontinued and plan is to continue heparin infusion for now.  Hydralazine and isosorbide  dinitrate dose was increased due to persistently elevated blood pressure. Cardiology and nephrology is on board.  7/1: Continue to have good diuresis after discontinuing milrinone.  Total output of 3225 in the past 24 hours with total net of -8526. Significant hyperglycemia with CBG remained about 300-400.  Increasing mealtime coverage to 6 unit and changing Semglee to 10 units twice daily.  Renal function stable. We will remove Foley catheter today to give him a voiding trial.  Continue to have strict intake and output.  Cardiology is going to continue with Lasix infusion for now  7/2: Patient remained stable and remained on Lasix infusion.  Renal functions with slight improvement.  Good urinary output, net negative of 11,000.  Cardiology would like to continue Lasix infusion for another day or so.   Assessment and Plan: * Acute on chronic HFrEF (heart failure with reduced ejection fraction) ([place]) Patient admitted for concern of acute on chronic HFrEF, repeat echocardiogram with worsening of EF to 25 to 30%.  Cardiology is on board. Significant elevated BNP at 2973. Patient was started on milrinone and Lasix infusion with improvement in diuresis and edema. -Milrinone was stopped on 07/03/2021.  Continues to have good urinary output. -Continue Lasix infusion -Continue with daily BMP and weight -Strict intake and output  Anasarca Improving. Nephrology and cardiology on board.   -Continue to monitor   Acute kidney injury superimposed on CKD 3b ([place]) Worsening of creatinine with diuresis, also received IV contrast.  Concern of cardiorenal syndrome.  Baseline around 1.27, peaked at 4.26 and now with slight improvement, still above baseline. Nephrology is on board. -  Continue to monitor -Avoid nephrotoxins -Continue to hold Entresto -Strict intake and output  Type 2 diabetes mellitus with stage 3b chronic kidney disease, with long-term current use of insulin ([name]) Apparently patient had  an history of hypoglycemia and discharge home without any insulin during prior hospitalization at [facility].  Then went to [facility] and found to have hyperglycemia and started on 20 units of glargine daily. Significant hyperglycemia with CBG remained above 300-400. -Increase Semglee to 10 units twice daily -Increase mealtime coverage to 6 unit -Continue with SSI  Essential hypertension Blood pressure remained elevated. -Continue Coreg. -Keep holding Entresto due to AKI -Continue Lasix infusion -Continue Isordil  -Hydralazine was increased to 75 3 times daily   Iron deficiency anemia --s/p IV iron --plan to discharge on oral iron supplement.    Subjective: Patient was seen and examined today.  No new complaints.  Physical Exam: Vitals:   07/05/21 1000 07/05/21 1100 07/05/21 1200 07/05/21 1447  BP: 134/80  133/84 (!) 143/76  Pulse: 70 67 72   Resp: 19 13 16    Temp:   (!) 97.4 F (36.3 C)   TempSrc:   Oral   SpO2:   99%   Weight:      Height:       General.  Well-developed gentleman, in no acute distress. Pulmonary.  Lungs clear bilaterally, normal respiratory effort. CV.  Regular rate and rhythm, no JVD, rub or murmur. Abdomen.  Soft, nontender, nondistended, BS positive. CNS.  Alert and oriented .  No focal neurologic deficit. Extremities.  Trace LE edema, no cyanosis, pulses intact and symmetrical. Psychiatry.  Judgment and insight appears normal.  Data Reviewed: Prior data reviewed  Family Communication: Discussed with patient  Disposition: Status is: Inpatient Remains inpatient appropriate because: Severity of illness   Planned Discharge Destination: Home  Time spent: 40 minutes  This record has been created using Systems analyst. Errors have been sought and corrected,but may not always be located. Such creation errors do not reflect on the standard of care.  Author: Lorella Nimrod, MD 07/05/2021 5:03 PM  For on call review [email or web address].

## 2021-07-05 NOTE — Progress Notes (Signed)
RN called to 2A to give report. The nurse was providing patient care. Will call back.

## 2021-07-05 NOTE — Progress Notes (Signed)
Patient had an uneventful night. Patient slept all night. Lasix drip maintained. Urine output documented in flow sheets.

## 2021-07-05 NOTE — Progress Notes (Signed)
[facility]  ROUNDING NOTE   Subjective:   Shawn [name] is a 66 y.o. AA male with past medical conditions of CVA, diabetes, CHF, hypertension, and chronic kidney disease Iib. Patient presented to ED with lower extremity swelling and scrotal. Patient is admitted for Anasarca [R60.1] Testicle swelling [N50.89] Acute on chronic congestive heart failure, unspecified heart failure type [name]) [I50.9]  Patient is known to our clinic and is followed by Shawn [name].   Update: Patient overall doing better. Creatinine down to 3.2. Urine output 3 L over the preceding 24 hours.   Objective:  Vital signs in last 24 hours:  Temp:  [97.2 F (36.2 C)-98.2 F (36.8 C)] 97.2 F (36.2 C) (07/02 0902) Pulse Rate:  [64-84] 70 (07/02 1000) Resp:  [12-21] 19 (07/02 1000) BP: (126-155)/(76-92) 134/80 (07/02 1000) SpO2:  [94 %-99 %] 94 % (07/01 2000) Weight:  [75.7 kg] 75.7 kg (07/02 0500)  Weight change: 5.6 kg Filed Weights   07/03/21 0400 07/04/21 0417 07/05/21 0500  Weight: 70.4 kg 70.1 kg 75.7 kg    Intake/Output: I/O last 3 completed shifts: In: 618.8 [P.O.:480; I.V.:138.8] Out: 5456 [YBWLS:9373; Stool:1]   Intake/Output this shift:  Total I/O In: -  Out: 200 [Urine:200]  Physical Exam: General: NAD  Head: Normocephalic, atraumatic. Moist oral mucosal membranes  Eyes: Anicteric  Lungs:  Clear to auscultation, normal effort  Heart: Regular rate and rhythm  Abdomen:  Soft, nontender  Extremities: Trace bilateral lower extremity edema  Neurologic: Nonfocal, moving all four extremities  Skin: No lesions  Access: None    Basic Metabolic Panel: Recent Labs  Lab 06/29/21 0418 06/30/21 0447 07/01/21 0451 07/02/21 0513 07/03/21 0408 07/04/21 0448 07/05/21 0515  NA 135 136 136 139 137 136 137  K 4.3 4.1 4.0 4.0 4.1 4.2 3.7  CL 104 106 104 104 102 101 101  CO2 22 24 23 26 26 27 29   GLUCOSE 171* 265* 115* 195* 227* 431* 64*  BUN 54* 55* 55* 53* 52* 59* 55*   CREATININE 4.26* 3.98* 3.93* 3.75* 3.41* 3.40* 3.18*  CALCIUM 8.6* 8.3* 8.5* 8.4* 8.9 8.8* 9.1  MG 2.0 2.0 1.7 2.3 1.8  --   --      Liver Function Tests: No results for input(s): "AST", "ALT", "ALKPHOS", "BILITOT", "PROT", "ALBUMIN" in the last 168 hours.  No results for input(s): "LIPASE", "AMYLASE" in the last 168 hours. No results for input(s): "AMMONIA" in the last 168 hours.  CBC: Recent Labs  Lab 06/29/21 0418 06/30/21 0447 07/01/21 0451 07/02/21 0513 07/03/21 0408  WBC 5.3 5.2 6.3 6.1 6.2  HGB 9.3* 8.9* 8.6* 8.5* 8.9*  HCT 29.6* 27.7* 26.9* 26.3* 28.1*  MCV 90.2 90.5 89.7 90.4 90.4  PLT 360 331 344 378 388     Cardiac Enzymes: No results for input(s): "CKTOTAL", "CKMB", "CKMBINDEX", "TROPONINI" in the last 168 hours.  BNP: Invalid input(s): "POCBNP"  CBG: Recent Labs  Lab 07/04/21 1127 07/04/21 1611 07/04/21 2110 07/05/21 0801 07/05/21 1112  GLUCAP 320* 222* 116* 70 139*     Microbiology: Results for orders placed or performed during the hospital encounter of 06/23/21  Urine Culture     Status: Abnormal   Collection Time: 06/23/21 11:10 AM   Specimen: Urine, Clean Catch  Result Value Ref Range Status   Specimen Description   Final    URINE, CLEAN CATCH Performed at [name], [address] [place], [place] [address]    Special Requests   Final  NONE Performed at [name], [address] [place], [place] [address]    Culture MULTIPLE SPECIES PRESENT, SUGGEST RECOLLECTION (A)  Final   Report Status 06/24/2021 FINAL  Final  Body fluid culture w Gram Stain     Status: None   Collection Time: 06/25/21 11:29 AM   Specimen: Peritoneal Washings  Result Value Ref Range Status   Specimen Description   Final    PERITONEAL Performed at [facility], [address], [place], [place] [address]    Special Requests   Final    NONE Performed at [name], [place], [place]  [address]    Gram Stain NO WBC SEEN NO ORGANISMS SEEN   Final   Culture   Final    NO GROWTH 3 DAYS Performed at [facility], [place] [address] [place], [place] [address]    Report Status 06/28/2021 FINAL  Final  MRSA Next Gen by PCR, Nasal     Status: None   Collection Time: 06/29/21  3:12 PM   Specimen: Nasal Mucosa; Nasal Swab  Result Value Ref Range Status   MRSA by PCR Next Gen NOT DETECTED NOT DETECTED Final    Comment: (NOTE) The GeneXpert MRSA Assay (FDA approved for NASAL specimens only), is one component of a comprehensive MRSA colonization surveillance program. It is not intended to diagnose MRSA infection nor to guide or monitor treatment for MRSA infections. Test performance is not FDA approved in patients less than 80 years old. Performed at [name], [address] [place], [place] [address]     Coagulation Studies: No results for input(s): "LABPROT", "INR" in the last 72 hours.  Urinalysis: No results for input(s): "COLORURINE", "LABSPEC", "PHURINE", "GLUCOSEU", "HGBUR", "BILIRUBINUR", "KETONESUR", "PROTEINUR", "UROBILINOGEN", "NITRITE", "LEUKOCYTESUR" in the last 72 hours.  Invalid input(s): "APPERANCEUR"    Imaging: No results found.   Medications:    sodium chloride Stopped (07/03/21 1417)   furosemide (LASIX) 200 mg in dextrose 5 % 100 mL (2 mg/mL) infusion 8 mg/hr (07/05/21 0942)    aspirin  81 mg Oral Daily   carvedilol  3.125 mg Oral BID WC   Chlorhexidine Gluconate Cloth  6 each Topical Daily   heparin injection (subcutaneous)  5,000 Units Subcutaneous Q8H   hydrALAZINE  75 mg Oral Q8H   insulin aspart  0-15 Units Subcutaneous TID WC   insulin aspart  6 Units Subcutaneous TID WC   insulin glargine-yfgn  10 Units Subcutaneous BID   isosorbide dinitrate  20 mg Oral Q8H   melatonin  5 mg Oral QHS   nicotine  21 mg Transdermal Daily   sodium chloride flush  3 mL Intravenous Q12H   sodium chloride, acetaminophen, docusate  sodium, polyethylene glycol, simethicone, sodium chloride flush, traMADol  Assessment/ Plan:  [name] is a 66 y.o.  male with past medical conditions of CVA, diabetes, CHF, hypertension, and chronic kidney disease IIib. Patient presented to ED with lower extremity swelling and scrotal. Patient is admitted for Anasarca [R60.1] Testicle swelling [N50.89] Acute on chronic congestive heart failure, unspecified heart failure type ([place]) [I50.9]   Acute Kidney Injury on chronic kidney disease stage IIIb with baseline creatinine 1.27 06/23/21.  Acute kidney injury likely secondary to IV contrast exposure and fluid shift as well as cardiorenal syndrome.  Creatinine continues to trend down and currently 3.18.  Good urine output of 3 L over the preceding 24 hours.  Okay to continue Lasix drip at  this time and defer transition to p.o. Lasix to when cardiology feels appropriate.  Lab Results  Component Value Date   CREATININE 3.18 (H) 07/05/2021   CREATININE 3.40 (H) 07/04/2021   CREATININE 3.41 (H) 07/03/2021    Intake/Output Summary (Last 24 hours) at 07/05/2021 1156 Last data filed at 07/05/2021 0813 Gross per 24 hour  Intake 319.37 ml  Output 2736 ml  Net -2416.63 ml    2. Hypertension with chronic kidney disease.  Home regimen includes Carvedilol and entresto. Entresto currently held.  Blood pressure 134/80  Blood pressure 134/80 today.  Continue carvedilol, hydralazine, and Lasix drip.   3. 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 Home regimen includes NovoLog and glargine Most recent hemoglobin A1c is 11.9 on 06/24/21.  Glycemic control per primary team   4.anemia of chronic kidney disease with iron deficiency.   Latest Reference Range & Units 06/27/21 07:01  Iron 45 - 182 ug/dL 24 (L)  UIBC ug/dL 210  TIBC 250 - 450 ug/dL 234 (L)  Saturation Ratios 17.9 - 39.5 % 10 (L)  Ferritin 24 - 336 ng/mL 59  Folate >5.9 ng/mL 25.0  (L): Data is  abnormally low  Consider Epogen as an outpatient.  5. Acute urinary retention Resolved with foley placement  6.  Acute on chronic systolic heart failure. ECHO on 06/26/21 shows EF 25-30% global hypokinesis, grade 2 diastolic dysfunction, moderate mitral regurgitation, and small pericardial effusion.   Right heart cath on 06/29/21, found to have elevated left heart filling pressure and moderately to severely elevated right heart and pulmonary artery pressures.  Patient was initially on milrinone and now transitioned off.  Continue Lasix drip per cardiology.  We defer decision regarding transition to p.o. Lasix to cardiology.    LOS: 11 Aijalon Demuro 7/2/202311:56 AM

## 2021-07-05 NOTE — Assessment & Plan Note (Signed)
Patient admitted for concern of acute on chronic HFrEF, repeat echocardiogram with worsening of EF to 25 to 30%.  Cardiology is on board. Significant elevated BNP at 2973. Patient was started on milrinone and Lasix infusion with improvement in diuresis and edema. -Milrinone was stopped on 07/03/2021.  Continues to have good urinary output. -Continue Lasix infusion -Continue with daily BMP and weight -Strict intake and output

## 2021-07-06 DIAGNOSIS — I5023 Acute on chronic systolic (congestive) heart failure: Secondary | ICD-10-CM | POA: Diagnosis not present

## 2021-07-06 LAB — GLUCOSE, CAPILLARY
Glucose-Capillary: 125 mg/dL — ABNORMAL HIGH (ref 70–99)
Glucose-Capillary: 114 mg/dL — ABNORMAL HIGH (ref 70–99)
Glucose-Capillary: 105 mg/dL — ABNORMAL HIGH (ref 70–99)
Glucose-Capillary: 110 mg/dL — ABNORMAL HIGH (ref 70–99)

## 2021-07-06 LAB — BASIC METABOLIC PANEL
Anion gap: 6 (ref 5–15)
BUN: 58 mg/dL — ABNORMAL HIGH (ref 8–23)
CO2: 30 mmol/L (ref 22–32)
Calcium: 9 mg/dL (ref 8.9–10.3)
Chloride: 101 mmol/L (ref 98–111)
Creatinine, Ser: 3.21 mg/dL — ABNORMAL HIGH (ref 0.61–1.24)
GFR, Estimated: 21 mL/min — ABNORMAL LOW (ref >60)
Glucose, Bld: 132 mg/dL — ABNORMAL HIGH (ref 70–99)
Potassium: 3.6 mmol/L (ref 3.5–5.1)
Sodium: 137 mmol/L (ref 135–145)

## 2021-07-06 MED ORDER — FUROSEMIDE 40 MG PO TABS
40.0000 mg | ORAL_TABLET | Freq: Two times a day (BID) | ORAL | Status: DC
  Administered 2021-07-06 – 2021-07-07 (×2): 40 mg via ORAL
  Filled 2021-07-06 (×2): qty 1

## 2021-07-06 MED ORDER — SIMETHICONE 80 MG PO CHEW: 80.0000 mg | CHEWABLE_TABLET | Freq: Four times a day (QID) | ORAL | Status: DC | PRN

## 2021-07-06 NOTE — Assessment & Plan Note (Signed)
Patient admitted for concern of acute on chronic HFrEF, repeat echocardiogram with worsening of EF to 25 to 30%.  Cardiology is on board. Significant elevated BNP at 2973. Patient was started on milrinone and Lasix infusion with improvement in diuresis and edema. -Milrinone was stopped on 07/03/2021.  Continues to have good urinary output. -Lasix infusion will be discontinued today -Start him on p.o. Lasix from tomorrow -Continue with daily BMP and weight -Strict intake and output

## 2021-07-06 NOTE — Assessment & Plan Note (Signed)
Improving. Nephrology and cardiology on board.   -Continue to monitor

## 2021-07-06 NOTE — Progress Notes (Signed)
[facility]  ROUNDING NOTE   Subjective:   Shawn [name] is a 66 y.o. AA male with past medical conditions of CVA, diabetes, CHF, hypertension, and chronic kidney disease Iib. Patient presented to ED with lower extremity swelling and scrotal. Patient is admitted for Anasarca [R60.1] Testicle swelling [N50.89] Acute on chronic congestive heart failure, unspecified heart failure type [facility]) [I50.9]  Patient is known to our clinic and is followed by Dr Candiss Norse.   Update: Patient seen sitting up in bed, completed breakfast tray at bedside States he feels well today Continues to voice desire for discharge No lower extremity edema.   Objective:  Vital signs in last 24 hours:  Temp:  [97.4 F (36.3 C)-98.4 F (36.9 C)] 97.9 F (36.6 C) (07/03 1133) Pulse Rate:  [71-87] 77 (07/03 1133) Resp:  [11-20] 17 (07/03 1133) BP: (98-143)/(58-81) 141/79 (07/03 1133) SpO2:  [95 %-100 %] 97 % (07/03 1133) Weight:  [64.8 kg] 64.8 kg (07/03 0500)  Weight change: -10.9 kg Filed Weights   07/04/21 0417 07/05/21 0500 07/06/21 0500  Weight: 70.1 kg 75.7 kg 64.8 kg    Intake/Output: I/O last 3 completed shifts: In: 211.5 [P.O.:120; I.V.:91.5] Out: 4025 [Urine:4025]   Intake/Output this shift:  Total I/O In: -  Out: 450 [Urine:450]  Physical Exam: General: NAD  Head: Normocephalic, atraumatic. Moist oral mucosal membranes  Eyes: Anicteric  Lungs:  Clear to auscultation, normal effort  Heart: Regular rate and rhythm  Abdomen:  Soft, nontender  Extremities: No bilateral lower extremity edema  Neurologic: Nonfocal, moving all four extremities  Skin: No lesions  Access: None    Basic Metabolic Panel: Recent Labs  Lab 06/30/21 0447 07/01/21 0451 07/02/21 0513 07/03/21 0408 07/04/21 0448 07/05/21 0515 07/06/21 0600  NA 136 136 139 137 136 137 137  K 4.1 4.0 4.0 4.1 4.2 3.7 3.6  CL 106 104 104 102 101 101 101  CO2 24 23 26 26 27 29 30   GLUCOSE 265* 115* 195*  227* 431* 64* 132*  BUN 55* 55* 53* 52* 59* 55* 58*  CREATININE 3.98* 3.93* 3.75* 3.41* 3.40* 3.18* 3.21*  CALCIUM 8.3* 8.5* 8.4* 8.9 8.8* 9.1 9.0  MG 2.0 1.7 2.3 1.8  --   --   --      Liver Function Tests: No results for input(s): "AST", "ALT", "ALKPHOS", "BILITOT", "PROT", "ALBUMIN" in the last 168 hours.  No results for input(s): "LIPASE", "AMYLASE" in the last 168 hours. No results for input(s): "AMMONIA" in the last 168 hours.  CBC: Recent Labs  Lab 06/30/21 0447 07/01/21 0451 07/02/21 0513 07/03/21 0408  WBC 5.2 6.3 6.1 6.2  HGB 8.9* 8.6* 8.5* 8.9*  HCT 27.7* 26.9* 26.3* 28.1*  MCV 90.5 89.7 90.4 90.4  PLT 331 344 378 388     Cardiac Enzymes: No results for input(s): "CKTOTAL", "CKMB", "CKMBINDEX", "TROPONINI" in the last 168 hours.  BNP: Invalid input(s): "POCBNP"  CBG: Recent Labs  Lab 07/05/21 1112 07/05/21 1607 07/05/21 2105 07/06/21 0732 07/06/21 1131  GLUCAP 139* 138* 150* 125* 114*     Microbiology: Results for orders placed or performed during the hospital encounter of 06/23/21  Urine Culture     Status: Abnormal   Collection Time: 06/23/21 11:10 AM   Specimen: Urine, Clean Catch  Result Value Ref Range Status   Specimen Description   Final    URINE, CLEAN CATCH Performed at [facility], [address] [place], [place] [address]    Special Requests  Final    NONE Performed at [facility], [address] [place], [place] [address]    Culture MULTIPLE SPECIES PRESENT, SUGGEST RECOLLECTION (A)  Final   Report Status 06/24/2021 FINAL  Final  Body fluid culture w Gram Stain     Status: None   Collection Time: 06/25/21 11:29 AM   Specimen: Peritoneal Washings  Result Value Ref Range Status   Specimen Description   Final    PERITONEAL Performed at [facility], [address], [place], [place] [address]    Special Requests   Final    NONE Performed at [facility], [place], [place] [address]    Gram Stain NO WBC SEEN NO ORGANISMS SEEN   Final   Culture   Final    NO GROWTH 3 DAYS Performed at [facility], [place] [address], [place], [place] [address]    Report Status 06/28/2021 FINAL  Final  MRSA Next Gen by PCR, Nasal     Status: None   Collection Time: 06/29/21  3:12 PM   Specimen: Nasal Mucosa; Nasal Swab  Result Value Ref Range Status   MRSA by PCR Next Gen NOT DETECTED NOT DETECTED Final    Comment: (NOTE) The GeneXpert MRSA Assay (FDA approved for NASAL specimens only), is one component of a comprehensive MRSA colonization surveillance program. It is not intended to diagnose MRSA infection nor to guide or monitor treatment for MRSA infections. Test performance is not FDA approved in patients less than 24 years old. Performed at [facility], [address] [place], [place] [address]     Coagulation Studies: No results for input(s): "LABPROT", "INR" in the last 72 hours.  Urinalysis: No results for input(s): "COLORURINE", "LABSPEC", "PHURINE", "GLUCOSEU", "HGBUR", "BILIRUBINUR", "KETONESUR", "PROTEINUR", "UROBILINOGEN", "NITRITE", "LEUKOCYTESUR" in the last 72 hours.  Invalid input(s): "APPERANCEUR"    Imaging: No results found.   Medications:    sodium chloride Stopped (07/03/21 1417)    aspirin  81 mg Oral Daily   carvedilol  3.125 mg Oral BID WC   Chlorhexidine Gluconate Cloth  6 each Topical Daily   furosemide  40 mg Oral BID   heparin injection (subcutaneous)  5,000 Units Subcutaneous Q8H   hydrALAZINE  75 mg Oral Q8H   insulin aspart  0-15 Units Subcutaneous TID WC   insulin aspart  6 Units Subcutaneous TID WC   insulin glargine-yfgn  10 Units Subcutaneous BID   isosorbide dinitrate  20 mg Oral Q8H   melatonin  5 mg Oral QHS   nicotine  21 mg Transdermal Daily   sodium chloride flush  3 mL Intravenous Q12H   sodium chloride, acetaminophen, docusate sodium, polyethylene glycol, simethicone, sodium  chloride flush, traMADol  Assessment/ Plan:  [name] is a 66 y.o.  male with past medical conditions of CVA, diabetes, CHF, hypertension, and chronic kidney disease IIib. Patient presented to ED with lower extremity swelling and scrotal. Patient is admitted for Anasarca [R60.1] Testicle swelling [N50.89] Acute on chronic congestive heart failure, unspecified heart failure type ([place]) [I50.9]   Acute Kidney Injury on chronic kidney disease stage IIIb with baseline creatinine 1.27 06/23/21.  Acute kidney injury likely secondary to IV contrast exposure and fluid shift as well as cardiorenal syndrome.  Creatinine continues to fluctuate however good urine output recorded at 2.27 L in preceding 24 hours.  We will stop furosemide drip and return patient to oral furosemide 40 mg twice daily.  Lab  Results  Component Value Date   CREATININE 3.21 (H) 07/06/2021   CREATININE 3.18 (H) 07/05/2021   CREATININE 3.40 (H) 07/04/2021    Intake/Output Summary (Last 24 hours) at 07/06/2021 1243 Last data filed at 07/06/2021 1100 Gross per 24 hour  Intake 152 ml  Output 2275 ml  Net -2123 ml    2. Hypertension with chronic kidney disease.  Home regimen includes Carvedilol and entresto. Entresto currently held.    Blood pressure 141/79 today.    3. 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 Home regimen includes NovoLog and glargine Most recent hemoglobin A1c is 11.9 on 06/24/21.   Glucose well managed by primary team.  4.anemia of chronic kidney disease with iron deficiency.   Latest Reference Range & Units 06/27/21 07:01  Iron 45 - 182 ug/dL 24 (L)  UIBC ug/dL 210  TIBC 250 - 450 ug/dL 234 (L)  Saturation Ratios 17.9 - 39.5 % 10 (L)  Ferritin 24 - 336 ng/mL 59  Folate >5.9 ng/mL 25.0  (L): Data is abnormally low  Consider Epogen as an outpatient. Plans to discharge on oral iron.  5. Acute urinary retention Foley catheter removed and patient  voiding well.  6.  Acute on chronic systolic heart failure. ECHO on 06/26/21 shows EF 25-30% global hypokinesis, grade 2 diastolic dysfunction, moderate mitral regurgitation, and small pericardial effusion.   Right heart cath on 06/29/21, found to have elevated left heart filling pressure and moderately to severely elevated right heart and pulmonary artery pressures.  Patient has t weaned off milrinone.  Will transition furosemide to oral dosing today.    LOS: [place] 7/3/202312:43 PM

## 2021-07-06 NOTE — Assessment & Plan Note (Signed)
Worsening of creatinine with diuresis, also received IV contrast.  Concern of cardiorenal syndrome.  Baseline around 1.27, peaked at 4.26 and now with slight improvement, still above baseline. Nephrology is on board. -Continue to monitor -Avoid nephrotoxins -Continue to hold Entresto -Strict intake and output

## 2021-07-06 NOTE — Progress Notes (Signed)
Progress Note  Patient Name: Shawn [name] Date of Encounter: 07/06/2021  Primary Cardiologist: Rockey Situ  Subjective   Transferred out of the ICU to PCU yesterday. Documented UOP 2.1 L for the past 24 hours, net - 13 L for the admission. Dyspnea much improved. No chest pain.  Inpatient Medications    Scheduled Meds:  aspirin  81 mg Oral Daily   carvedilol  3.125 mg Oral BID WC   Chlorhexidine Gluconate Cloth  6 each Topical Daily   heparin injection (subcutaneous)  5,000 Units Subcutaneous Q8H   hydrALAZINE  75 mg Oral Q8H   insulin aspart  0-15 Units Subcutaneous TID WC   insulin aspart  6 Units Subcutaneous TID WC   insulin glargine-yfgn  10 Units Subcutaneous BID   isosorbide dinitrate  20 mg Oral Q8H   melatonin  5 mg Oral QHS   nicotine  21 mg Transdermal Daily   sodium chloride flush  3 mL Intravenous Q12H   Continuous Infusions:  sodium chloride Stopped (07/03/21 1417)   furosemide (LASIX) 200 mg in dextrose 5 % 100 mL (2 mg/mL) infusion 8 mg/hr (07/05/21 1800)   PRN Meds: sodium chloride, acetaminophen, docusate sodium, polyethylene glycol, simethicone, sodium chloride flush, traMADol   Vital Signs    Vitals:   07/06/21 0012 07/06/21 0353 07/06/21 0500 07/06/21 0735  BP: 138/81 (!) 141/79  130/65  Pulse: 78 82  74  Resp: 16 18  17   Temp: 98.3 F (36.8 C) 97.8 F (36.6 C)  98.2 F (36.8 C)  TempSrc: Oral     SpO2: 100% 98%  99%  Weight:   64.8 kg   Height:        Intake/Output Summary (Last 24 hours) at 07/06/2021 0853 Last data filed at 07/06/2021 0500 Gross per 24 hour  Intake 167.99 ml  Output 2075 ml  Net -1907.01 ml    Filed Weights   07/04/21 0417 07/05/21 0500 07/06/21 0500  Weight: 70.1 kg 75.7 kg 64.8 kg    Telemetry    SR, occasional PVCs - Personally Reviewed  ECG    No new tracings - Personally Reviewed  Physical Exam   GEN: thin, well developed in no acute distress NECK: No JVD CARDIAC: regular rhythm, normal S1 and  S2, no rubs or gallops. 2/6 systolic murmur. VASCULAR: Radial pulses 2+ bilaterally.  RESPIRATORY:  Clear to auscultation with improvement in basilar rales ABDOMEN: Soft, non-tender, non-distended MUSCULOSKELETAL:  Moves all 4 limbs independently SKIN: Warm and dry, bilateral trivial pitting LE edema NEUROLOGIC:  No focal neuro deficits noted. PSYCHIATRIC:  Normal affect    Labs    Chemistry Recent Labs  Lab 07/04/21 0448 07/05/21 0515 07/06/21 0600  NA 136 137 137  K 4.2 3.7 3.6  CL 101 101 101  CO2 27 29 30   GLUCOSE 431* 64* 132*  BUN 59* 55* 58*  CREATININE 3.40* 3.18* 3.21*  CALCIUM 8.8* 9.1 9.0  GFRNONAA 19* 21* 21*  ANIONGAP 8 7 6       Hematology Recent Labs  Lab 07/01/21 0451 07/02/21 0513 07/03/21 0408  WBC 6.3 6.1 6.2  RBC 3.00* 2.91* 3.11*  HGB 8.6* 8.5* 8.9*  HCT 26.9* 26.3* 28.1*  MCV 89.7 90.4 90.4  MCH 28.7 29.2 28.6  MCHC 32.0 32.3 31.7  RDW 14.8 14.8 15.2  PLT 344 378 388     Cardiac EnzymesNo results for input(s): "TROPONINI" in the last 168 hours. No results for input(s): "TROPIPOC" in the last 168 hours.  BNPNo results for input(s): "BNP", "PROBNP" in the last 168 hours.   DDimer No results for input(s): "DDIMER" in the last 168 hours.   Radiology    No results found.  Cardiac Studies   RHC 06/29/2021: Conclusions: Moderately elevated left heart filling pressure (PCWP 28 mmHg). Moderately to severely elevated right heart and pulmonary artery pressures (mean RA 13 mmHg, RVEDP 15 mmHg, mean PAP 41 mmHg). Normal Fick cardiac output (Fick CO 4.5 L/min, CI 2.7 L/min/m^2) with low central venous oxygen saturation (SvO2 51%). Successful placement of 70F triple lumen catheter via the right internal jugular vein.   Recommendations: Transfer to stepdown for initiation of milrinone. Continue furosemide infusion; dose escalation will likely be needed based on response to milrinone. Monitor CVP every 4 hours and daily central venous oxygen  saturations. __________  2D echo 06/26/2021: 1. Left ventricular ejection fraction, by estimation, is 25 to 30%. The  left ventricle has severely decreased function. The left ventricle  demonstrates global hypokinesis. The left ventricular internal cavity size  was mildly dilated. Left ventricular  diastolic parameters are consistent with Grade II diastolic dysfunction  (pseudonormalization). The average left ventricular global longitudinal  strain is 9.0 %. The global longitudinal strain is abnormal.   2. Right ventricular systolic function is normal. The right ventricular  size is normal. Tricuspid regurgitation signal is inadequate for assessing  PA pressure.   3. Left atrial size was mildly dilated.   4. A small pericardial effusion is present. The pericardial effusion is  circumferential. There is no evidence of cardiac tamponade. Moderate  pleural effusion in the left lateral region.   5. The mitral valve is normal in structure. Moderate mitral valve  regurgitation. No evidence of mitral stenosis.   6. The aortic valve is tricuspid. Aortic valve regurgitation is not  visualized. No aortic stenosis is present.   7. The inferior vena cava is dilated in size with >50% respiratory  variability, suggesting right atrial pressure of 8 mmHg. __________  2D echo 01/22/2021: 1. Left ventricular ejection fraction, by estimation, is 40 to 45%. The  left ventricle has mildly decreased function. The left ventricle  demonstrates global hypokinesis. There is severe left ventricular  hypertrophy. Left ventricular diastolic parameters   are consistent with Grade I diastolic dysfunction (impaired relaxation)  Shiny speckled appearing myocardium with severe LVH, mild valve  thickening, small pericardial effusion. Findings could suggest cardiac  amyolidosis.   2. Right ventricular systolic function is normal. The right ventricular  size is normal. Tricuspid regurgitation signal is inadequate for  assessing  PA pressure.   3. A small pericardial effusion is present. The pericardial effusion is  circumferential.   4. The mitral valve is normal in structure. No evidence of mitral valve  regurgitation. No evidence of mitral stenosis.   5. The aortic valve is tricuspid. There is mild calcification of the  aortic valve. There is mild thickening of the aortic valve. Aortic valve  regurgitation is not visualized. No aortic stenosis is present.   6. The inferior vena cava is normal in size with greater than 50%  respiratory variability, suggesting right atrial pressure of 3 mmHg. __________  2D echo 05/11/2018: 1. The left ventricle has severely reduced systolic function, with an  ejection fraction of 25-30%. The cavity size was mildly dilated. There is  mildly increased left ventricular wall thickness. Left ventricular  diastolic Doppler parameters are  consistent with pseudonormalization. Left ventrical global hypokinesis  without regional wall motion abnormalities.   2.  The right ventricle has normal systolic function. The cavity was  normal. There is no increase in right ventricular wall thickness.Unable to  estimate RVSP   3. Left atrial size was mildly dilated.   4. Mitral valve regurgitation is mild to moderate  __________  2D echo 04/29/2017: - Left ventricle: The cavity size was normal. There was moderate    concentric hypertrophy. Systolic function was severely reduced.    The estimated ejection fraction was in the range of 20% to 25%.    Diffuse hypokinesis. Regional wall motion abnormalities cannot be    excluded. Doppler parameters are consistent with abnormal left    ventricular relaxation (grade 1 diastolic dysfunction).  - Left atrium: The atrium was normal in size.  - Right ventricle: Systolic function was normal.  - Pulmonary arteries: Systolic pressure could not be accurately    estimated.  Patient Profile     66 y.o. male with history of chronic HFrEF due to  reported NICM, CKD stage III, DM 2, CVA, and HTN admitted with acute on chronic HFrEF.  Assessment & Plan    1.  Acute on chronic HFrEF due to suspected NICM: -May be nearing transition from Lasix gtt to oral, will discuss with MD -Close monitoring of urine output and renal function off inotropic support -Continue 3.125 mg BID carvedilol, hydralazine 75 mg tibd and Isordil 30 mg tid -cannot use ACEi/ARB/ARNI/MRA/SGLT2i given renal function -initial weight on 6/20 incorrect. Peak weight 76.6 kg, 7/1 weight 70.1 kg (weight yesterday 75.7 kg, suspect this is inaccurate given documented net negative), net negative ~13 L and with a weight of 64.8 kg today  2.  Acute on CKD stage IIIa: -Cr 05/14/21 was 1.41, GFR 56, consistent with CKD stage 3a -Peak Cr this admission 4.26, current 3.21, still significantly over recent baseline. Admission Cr 1.27 -Appreciate nephrology assistance  3.  HTN: -Blood pressure improved with escalation of medical therapy as outlined above  4. Type II diabetes -with current renal function, cannot use ACEi/ARB/ARNI/MRA/SGLT2i     For questions or updates, please contact [facility] Please consult [email or web address] for contact info under Cardiology/STEMI.    Signed, Buford Dresser, MD, PhD, [facility] at [facility] at [facility] [address] [place], [place] [address] [phone]  07/06/2021, 8:53 AM

## 2021-07-06 NOTE — Progress Notes (Signed)
Progress Note   Patient: Shawn [name] OXB:[id number] DOB: 04/28/1955 DOA: 06/23/2021     12 DOS: the patient was seen and examined on 07/06/2021   Brief hospital course: Taken from prior notes.  Shawn [name] is a 66 y.o. male with medical history significant of poorly controlled t2dm, ckd 3b, combined CHF, smoking, alcohol abuse, hypertension, prior CVA, malnutrition who presented scrotal and leg swelling.  Home torsemide and Aldactone were held after hospitalization in May 2023 for DKA and AKI, and not resumed due to no PCP followup (per pt, PCP wanted him to find another doctor). --on current presentation, BNP 2973, imaging studies showed pulm edema, ascites, scrotal edema.  Also has BLE and pedal edema.  Swelling was severe and limiting his movements.  Also has small bilateral pleural effusions.  home torsemide and Aldactone were held after hospitalization in May 2023 for DKA and AKI, and not resumed due to no PCP followup.  Current Echo showed LVEF had worsened from 40-45% to 25-30% in just 5 months.  Cardiology consulted.  Right heart cath on 6/26, found to have Moderately to severely elevated right heart and pulmonary artery pressures.  Due to lack of response to regular diuresis, patient was transferred to stepdown and started on  milrinone and Lasix infusion.  6/28: Started having good diuresis, total of 2615 mL of her output recorded.  Renal function seems stable, creatinine at 3.93 today.  Cardiology is planning to continue milrinone and Lasix infusion at this time.  Clinically improving.  6/29: Patient remained stable, mildly elevated blood pressure, milrinone drip rate was decreased.  Cardiology would like to continue Lasix infusion.  Net 2.4 L over the past 24 hours.  Creatinine with some improvement.  6/30: Patient continued to improve.  Improving renal function.  Milrinone was discontinued and plan is to continue heparin infusion for now.  Hydralazine and isosorbide  dinitrate dose was increased due to persistently elevated blood pressure. Cardiology and nephrology is on board.  7/1: Continue to have good diuresis after discontinuing milrinone.  Total output of 3225 in the past 24 hours with total net of -8526. Significant hyperglycemia with CBG remained about 300-400.  Increasing mealtime coverage to 6 unit and changing Semglee to 10 units twice daily.  Renal function stable. We will remove Foley catheter today to give him a voiding trial.  Continue to have strict intake and output.  Cardiology is going to continue with Lasix infusion for now  7/2: Patient remained stable and remained on Lasix infusion.  Renal functions with slight improvement.  Good urinary output, net negative of 11,000.  Cardiology would like to continue Lasix infusion for another day or so.  7/3: Renal function seems overall stable with slight variation.  Continue to have good urinary output with net negative of above 13 L.  Lasix infusion will be discontinued later today and he will be started on p.o. from tomorrow. Cardiology to decide about ischemic work-up as an outpatient after improvement of renal function. No ACE inhibitor or ARB's due to acute on chronic kidney disease per cardiology.   Assessment and Plan: * Acute on chronic HFrEF (heart failure with reduced ejection fraction) ([place]) Patient admitted for concern of acute on chronic HFrEF, repeat echocardiogram with worsening of EF to 25 to 30%.  Cardiology is on board. Significant elevated BNP at 2973. Patient was started on milrinone and Lasix infusion with improvement in diuresis and edema. -Milrinone was stopped on 07/03/2021.  Continues to have good urinary output. -  Lasix infusion will be discontinued today -Start him on p.o. Lasix from tomorrow -Continue with daily BMP and weight -Strict intake and output  Anasarca Improving. Nephrology and cardiology on board.   -Continue to monitor   Acute kidney injury  superimposed on CKD 3b ([place]) Worsening of creatinine with diuresis, also received IV contrast.  Concern of cardiorenal syndrome.  Baseline around 1.27, peaked at 4.26 and now with slight improvement, still above baseline. Nephrology is on board. -Continue to monitor -Avoid nephrotoxins -Continue to hold Entresto -Strict intake and output  Type 2 diabetes mellitus with stage 3b chronic kidney disease, with long-term current use of insulin ([name]) Apparently patient had an history of hypoglycemia and discharge home without any insulin during prior hospitalization at [facility].  Then went to [facility] and found to have hyperglycemia and started on 20 units of glargine daily. Significant hyperglycemia with CBG remained above 300-400. -Increase Semglee to 10 units twice daily -Increase mealtime coverage to 6 unit -Continue with SSI  Essential hypertension Blood pressure remained elevated. -Continue Coreg. -Keep holding Entresto due to AKI -Continue Lasix infusion -Continue Isordil  -Hydralazine was increased to 75 3 times daily   Iron deficiency anemia --s/p IV iron --plan to discharge on oral iron supplement.    Subjective: Patient was seen and examined today.  No new complaints.  Physical Exam: Vitals:   07/06/21 0353 07/06/21 0500 07/06/21 0735 07/06/21 1133  BP: (!) 141/79  130/65 (!) 141/79  Pulse: 82  74 77  Resp: 18  17 17   Temp: 97.8 F (36.6 C)  98.2 F (36.8 C) 97.9 F (36.6 C)  TempSrc:      SpO2: 98%  99% 97%  Weight:  64.8 kg    Height:       General.     In no acute distress. Pulmonary.  Lungs clear bilaterally, normal respiratory effort. CV.  Regular rate and rhythm, no JVD, rub or murmur. Abdomen.  Soft, nontender, nondistended, BS positive. CNS.  Alert and oriented .  No focal neurologic deficit. Extremities.  No edema, no cyanosis, pulses intact and symmetrical. Psychiatry.  Judgment and insight appears normal.  Data Reviewed: Prior data reviewed  Family  Communication: Discussed with patient  Disposition: Status is: Inpatient Remains inpatient appropriate because: Severity of illness   Planned Discharge Destination: Home  Time spent: 45 minutes  This record has been created using Systems analyst. Errors have been sought and corrected,but may not always be located. Such creation errors do not reflect on the standard of care.  Author: Lorella Nimrod, MD 07/06/2021 4:40 PM  For on call review [email or web address].

## 2021-07-07 DIAGNOSIS — D509 Iron deficiency anemia, unspecified: Secondary | ICD-10-CM | POA: Diagnosis not present

## 2021-07-07 DIAGNOSIS — I5023 Acute on chronic systolic (congestive) heart failure: Secondary | ICD-10-CM | POA: Diagnosis not present

## 2021-07-07 DIAGNOSIS — Z794 Long term (current) use of insulin: Secondary | ICD-10-CM

## 2021-07-07 DIAGNOSIS — I428 Other cardiomyopathies: Secondary | ICD-10-CM

## 2021-07-07 DIAGNOSIS — N1832 Chronic kidney disease, stage 3b: Secondary | ICD-10-CM

## 2021-07-07 DIAGNOSIS — N179 Acute kidney failure, unspecified: Secondary | ICD-10-CM | POA: Diagnosis not present

## 2021-07-07 DIAGNOSIS — I5043 Acute on chronic combined systolic (congestive) and diastolic (congestive) heart failure: Secondary | ICD-10-CM | POA: Diagnosis not present

## 2021-07-07 DIAGNOSIS — E1122 Type 2 diabetes mellitus with diabetic chronic kidney disease: Secondary | ICD-10-CM

## 2021-07-07 LAB — GLUCOSE, CAPILLARY
Glucose-Capillary: 130 mg/dL — ABNORMAL HIGH (ref 70–99)
Glucose-Capillary: 291 mg/dL — ABNORMAL HIGH (ref 70–99)

## 2021-07-07 LAB — BASIC METABOLIC PANEL
Anion gap: 5 (ref 5–15)
BUN: 64 mg/dL — ABNORMAL HIGH (ref 8–23)
CO2: 29 mmol/L (ref 22–32)
Calcium: 8.9 mg/dL (ref 8.9–10.3)
Chloride: 104 mmol/L (ref 98–111)
Creatinine, Ser: 3.15 mg/dL — ABNORMAL HIGH (ref 0.61–1.24)
GFR, Estimated: 21 mL/min — ABNORMAL LOW (ref >60)
Glucose, Bld: 96 mg/dL (ref 70–99)
Potassium: 3.8 mmol/L (ref 3.5–5.1)
Sodium: 138 mmol/L (ref 135–145)

## 2021-07-07 MED ORDER — NICOTINE 21 MG/24HR TD PT24: 21.0000 mg | MEDICATED_PATCH | Freq: Every day | TRANSDERMAL | 0 refills | Status: DC

## 2021-07-07 MED ORDER — ISOSORBIDE DINITRATE 20 MG PO TABS: 20.0000 mg | ORAL_TABLET | Freq: Three times a day (TID) | ORAL | 1 refills | Status: DC

## 2021-07-07 MED ORDER — CARVEDILOL 3.125 MG PO TABS: 3.1250 mg | ORAL_TABLET | Freq: Two times a day (BID) | ORAL | 1 refills | Status: DC

## 2021-07-07 MED ORDER — FUROSEMIDE 40 MG PO TABS: 40.0000 mg | ORAL_TABLET | Freq: Two times a day (BID) | ORAL | 1 refills | Status: DC

## 2021-07-07 MED ORDER — HYDRALAZINE HCL 25 MG PO TABS: 75.0000 mg | ORAL_TABLET | Freq: Three times a day (TID) | ORAL | 1 refills | Status: DC

## 2021-07-07 NOTE — Assessment & Plan Note (Signed)
Patient admitted for concern of acute on chronic HFrEF, repeat echocardiogram with worsening of EF to 25 to 30%.  Cardiology is on board. Significant elevated BNP at 2973. Patient was started on milrinone and Lasix infusion with improvement in diuresis and edema. -Milrinone was stopped on 07/03/2021.  Continues to have good urinary output. -Transitioned from Lasix infusion to p.o. Lasix 40 mg twice daily -Continue with daily BMP and weight -Strict intake and output

## 2021-07-07 NOTE — Progress Notes (Signed)
Central Line dressing change performed on Right IJ triple lumen

## 2021-07-07 NOTE — Discharge Summary (Signed)
Physician Discharge Summary   Patient: Shawn [name] MRN: [id number] DOB: [birth year]  Admit date:     06/23/2021  Discharge date: 07/07/21  Discharge Physician: Lorella Nimrod   PCP: [name], [place]   Recommendations at discharge:  Please obtain CBC and BMP in 1 week Follow-up with primary care provider within a week Follow-up with cardiology Follow-up with nephrology  Discharge Diagnoses: Principal Problem:   Acute on chronic HFrEF (heart failure with reduced ejection fraction) (Shawn [name]) Active Problems:   Anasarca   Acute kidney injury superimposed on CKD 3b (Shawn [name])   Type 2 diabetes mellitus with stage 3b chronic kidney disease, with long-term current use of insulin (Shawn [name])   Essential hypertension   Iron deficiency anemia  Resolved Problems:   AKI (acute kidney injury) (Shawn [name])   Acute on chronic combined systolic and diastolic CHF (congestive heart failure) (Shawn [name])   Stage 3a chronic kidney disease (CKD) Shawn [name])  Hospital Course: Taken from prior notes.  Shawn [name] is a 66 y.o. male with medical history significant of poorly controlled t2dm, ckd 3b, combined CHF, smoking, alcohol abuse, hypertension, prior CVA, malnutrition who presented scrotal and leg swelling.  Home torsemide and Aldactone were held after hospitalization in May 2023 for DKA and AKI, and not resumed due to no PCP followup (per pt, PCP wanted him to find another doctor). --on current presentation, BNP 2973, imaging studies showed pulm edema, ascites, scrotal edema.  Also has BLE and pedal edema.  Swelling was severe and limiting his movements.  Also has small bilateral pleural effusions.  home torsemide and Aldactone were held after hospitalization in May 2023 for DKA and AKI, and not resumed due to no PCP followup.  Current Echo showed LVEF had worsened from 40-45% to 25-30% in just 5 months.  Cardiology consulted.  Right heart cath on 6/26, found to have Moderately to severely elevated right  heart and pulmonary artery pressures.  Due to lack of response to regular diuresis, patient was transferred to stepdown and started on  milrinone and Lasix infusion.  6/28: Started having good diuresis, total of 2615 mL of her output recorded.  Renal function seems stable, creatinine at 3.93 today.  Cardiology is planning to continue milrinone and Lasix infusion at this time.  Clinically improving.  6/29: Patient remained stable, mildly elevated blood pressure, milrinone drip rate was decreased.  Cardiology would like to continue Lasix infusion.  Net 2.4 L over the past 24 hours.  Creatinine with some improvement.  6/30: Patient continued to improve.  Improving renal function.  Milrinone was discontinued and plan is to continue heparin infusion for now.  Hydralazine and isosorbide dinitrate dose was increased due to persistently elevated blood pressure. Cardiology and nephrology is on board.  7/1: Continue to have good diuresis after discontinuing milrinone.  Total output of 3225 in the past 24 hours with total net of -8526. Significant hyperglycemia with CBG remained about 300-400.  Increasing mealtime coverage to 6 unit and changing Semglee to 10 units twice daily.  Renal function stable. We will remove Foley catheter today to give him a voiding trial.  Continue to have strict intake and output.  Cardiology is going to continue with Lasix infusion for now  7/2: Patient remained stable and remained on Lasix infusion.  Renal functions with slight improvement.  Good urinary output, net negative of 11,000.  Cardiology would like to continue Lasix infusion for another day or so.  7/3: Renal function seems overall stable with slight variation.  Continue to have good urinary output with net negative of above 13 L.  Lasix infusion will be discontinued later today and he will be started on p.o. from tomorrow. Cardiology to decide about ischemic work-up as an outpatient after improvement of renal  function. No ACE inhibitor or ARB's due to acute on chronic kidney disease per cardiology.  7/4: Patient remained stable.  Renal functions stable with GFR of 21.  He was transitioned off from Lasix infusion, now on p.o. Lasix 40 mg twice daily. Patient will continue holding his home Entresto, Aldactone and torsemide and his out patient providers can restart when appropriate.  Patient is being discharged on current medications and need to have a close follow-up with cardiology and nephrology for further recommendations.   Assessment and Plan: * Acute on chronic HFrEF (heart failure with reduced ejection fraction) (Shawn [name]) Patient admitted for concern of acute on chronic HFrEF, repeat echocardiogram with worsening of EF to 25 to 30%.  Cardiology is on board. Significant elevated BNP at 2973. Patient was started on milrinone and Lasix infusion with improvement in diuresis and edema. -Milrinone was stopped on 07/03/2021.  Continues to have good urinary output. -Transitioned from Lasix infusion to p.o. Lasix 40 mg twice daily -Continue with daily BMP and weight -Strict intake and output  Anasarca Improving. Nephrology and cardiology on board.   -Continue to monitor   Acute kidney injury superimposed on CKD 3b ([place]) Worsening of creatinine with diuresis, also received IV contrast.  Concern of cardiorenal syndrome.  Baseline around 1.27, peaked at 4.26 and now with slight improvement, still above baseline. Nephrology is on board. -Continue to monitor -Avoid nephrotoxins -Continue to hold Entresto -Strict intake and output  Type 2 diabetes mellitus with stage 3b chronic kidney disease, with long-term current use of insulin (Shawn [name]) Apparently patient had an history of hypoglycemia and discharge home without any insulin during prior hospitalization at [facility].  Then went to [facility] and found to have hyperglycemia and started on 20 units of glargine daily. Significant hyperglycemia with CBG remained above  300-400. -Increase Semglee to 10 units twice daily -Increase mealtime coverage to 6 unit -Continue with SSI  Essential hypertension Blood pressure remained elevated. -Continue Coreg. -Keep holding Entresto due to AKI -Continue Lasix infusion -Continue Isordil  -Hydralazine was increased to 75 3 times daily   Iron deficiency anemia --s/p IV iron --plan to discharge on oral iron supplement.     Consultants: Nephrology, cardiology Procedures performed: None Disposition: Home Diet recommendation:  Discharge Diet Orders (From admission, onward)     Start     Ordered   07/07/21 0000  Diet - low sodium heart healthy        07/07/21 1111           Cardiac and Carb modified diet DISCHARGE MEDICATION: Allergies as of 07/07/2021       Reactions   Pravastatin    Other reaction(s): Muscle pain   Simvastatin    Other reaction(s): Muscle pain        Medication List     STOP taking these medications    sacubitril-valsartan 49-51 MG Commonly known as: ENTRESTO   spironolactone 25 MG tablet Commonly known as: ALDACTONE   torsemide 20 MG tablet Commonly known as: DEMADEX       TAKE these medications    acetaminophen 325 MG tablet Commonly known as: TYLENOL Take 2 tablets (650 mg total) by mouth every 6 (six) hours as needed for mild pain (or Fever >/= 101).  aspirin 81 MG chewable tablet Chew 1 tablet (81 mg total) by mouth daily.   atorvastatin 40 MG tablet Commonly known as: LIPITOR TAKE ONE TABLET BY MOUTH AT BEDTIME FOR CHOLESTEROL What changed: Another medication with the same name was removed. Continue taking this medication, and follow the directions you see here.   carvedilol 3.125 MG tablet Commonly known as: COREG Take 1 tablet (3.125 mg total) by mouth 2 (two) times daily with a meal. What changed:  medication strength how much to take   cholecalciferol 25 MCG (1000 UNIT) tablet Commonly known as: VITAMIN D Take 2,000 Units by mouth  daily. Mon   feeding supplement (GLUCERNA SHAKE) Liqd Take 237 mLs by mouth 3 (three) times daily between meals.   furosemide 40 MG tablet Commonly known as: LASIX Take 1 tablet (40 mg total) by mouth 2 (two) times daily.   hydrALAZINE 25 MG tablet Commonly known as: APRESOLINE Take 3 tablets (75 mg total) by mouth every 8 (eight) hours.   hydrOXYzine 10 MG tablet Commonly known as: ATARAX Take 10 mg by mouth 4 (four) times daily - after meals and at bedtime.   insulin glargine-yfgn 100 UNIT/ML Pen Commonly known as: SEMGLEE Inject 20 Units into the skin daily. What changed: how much to take   isosorbide dinitrate 20 MG tablet Commonly known as: ISORDIL Take 1 tablet (20 mg total) by mouth every 8 (eight) hours.   multivitamin with minerals Tabs tablet Take 1 tablet by mouth daily.   nicotine 21 mg/24hr patch Commonly known as: NICODERM CQ - dosed in mg/24 hours Place 1 patch (21 mg total) onto the skin daily.   NovoLOG FlexPen 100 UNIT/ML FlexPen Generic drug: insulin aspart Inject 6 Units into the skin 3 (three) times daily with meals.               Discharge Care Instructions  (From admission, onward)           Start     Ordered   07/07/21 0000  No dressing needed        07/07/21 1111            Follow-up [facility]. Schedule an appointment as soon as possible for a visit.   Specialty: General Practice Contact information: [address] [place] [address] [phone]         Minna Merritts, MD. Schedule an appointment as soon as possible for a visit in 1 week(s).   Specialty: Cardiology Contact information: [place] [address] [phone]         Murlean Iba, MD. Schedule an appointment as soon as possible for a visit in 1 week(s).   Specialty: Nephrology Contact information: [place] [address] [phone]                 Discharge Exam: Danley Danker Weights   07/05/21 0500 07/06/21 0500 07/07/21 0352  Weight: 75.7 kg 64.8 kg 61.7 kg   General.     In no acute distress. Pulmonary.  Lungs clear bilaterally, normal respiratory effort. CV.  Regular rate and rhythm, no JVD, rub or murmur. Abdomen.  Soft, nontender, nondistended, BS positive. CNS.  Alert and oriented .  No focal neurologic deficit. Extremities.  No edema, no cyanosis, pulses intact and symmetrical. Psychiatry.  Judgment and insight appears normal.   Condition at discharge: stable  The results of significant diagnostics from this hospitalization (  including imaging, microbiology, ancillary and laboratory) are listed below for reference.   Imaging Studies: CARDIAC CATHETERIZATION  Result Date: 06/29/2021 Conclusions: Moderately elevated left heart filling pressure (PCWP 28 mmHg). Moderately to severely elevated right heart and pulmonary artery pressures (mean RA 13 mmHg, RVEDP 15 mmHg, mean PAP 41 mmHg). Normal Fick cardiac output (Fick CO 4.5 L/min, CI 2.7 L/min/m^2) with low central venous oxygen saturation (SvO2 51%). Successful placement of 78F triple lumen catheter via the right internal jugular vein. Recommendations: Transfer to stepdown for initiation of milrinone. Continue furosemide infusion; dose escalation will likely be needed based on response to milrinone. Monitor CVP every 4 hours and daily central venous oxygen saturations. Nelva Bush, MD [facility]  DG Chest Port 1 View  Result Date: 06/29/2021 CLINICAL DATA:  Central venous catheter placement EXAM: PORTABLE CHEST 1 VIEW COMPARISON:  06/28/2021 FINDINGS: Right internal jugular central venous catheter tip: SVC. No visible pneumothorax. Increased hazy density in both lungs with obscuration of both hemidiaphragms and accentuated interstitial accentuation. Mild enlargement of the cardiopericardial silhouette. IMPRESSION: 1. Right IJ line tip: SVC.  No pneumothorax. 2. Increased  hazy opacity and interstitial accentuation in both lungs potentially from pulmonary edema with pleural effusions or atelectasis. Electronically Signed   By: Van Clines M.D.   On: 06/29/2021 12:30   DG Chest 1 View  Result Date: 06/28/2021 CLINICAL DATA:  Patient admitted to hospital lower extremity edema. CHF. EXAM: CHEST  1 VIEW COMPARISON:  June 23, 2021 FINDINGS: Stable mild cardiomegaly. The hila and mediastinum are unremarkable. Diffuse increased interstitial opacities. Small bilateral effusions. More focal opacity in the right base. No pneumothorax. No other acute abnormalities. IMPRESSION: 1. Cardiomegaly, small effusions, and diffuse interstitial opacities. The interstitial opacities are favored represent pulmonary edema. 2. More focal opacity in the right base could represent atelectasis or developing infiltrate. Recommend attention on follow-up. Electronically Signed   By: Dorise Bullion III M.D.   On: 06/28/2021 11:43   ECHOCARDIOGRAM COMPLETE  Result Date: 06/26/2021    ECHOCARDIOGRAM REPORT   Patient Name:   JERMEL ARTLEY Date of Exam: 06/26/2021 Medical Rec #:  [id number]           Height:       67.0 in Accession #:    [id number]          Weight:       130.1 lb Date of Birth:  28-May-1955            BSA:          1.684 m Patient Age:    37 years            BP:           143/85 mmHg Patient Gender: M                   HR:           81 bpm. Exam Location:  [facility] Procedure: 2D Echo, Color Doppler, Cardiac Doppler and Strain Analysis Indications:     Anasarca  History:         Patient has prior history of Echocardiogram examinations, most                  recent 01/22/2021. CHF and Non-ischemic cardiomyopathy, CKD,                  stage II; Risk Factors:Hypertension and Diabetes.  Sonographer:     Charmayne Sheer Referring Phys:  [id number] Otila Kluver  LAI Diagnosing Phys: Ida Rogue MD  Sonographer Comments: Global longitudinal strain was attempted. IMPRESSIONS  1. Left ventricular ejection  fraction, by estimation, is 25 to 30%. The left ventricle has severely decreased function. The left ventricle demonstrates global hypokinesis. The left ventricular internal cavity size was mildly dilated. Left ventricular diastolic parameters are consistent with Grade II diastolic dysfunction (pseudonormalization). The average left ventricular global longitudinal strain is 9.0 %. The global longitudinal strain is abnormal.  2. Right ventricular systolic function is normal. The right ventricular size is normal. Tricuspid regurgitation signal is inadequate for assessing PA pressure.  3. Left atrial size was mildly dilated.  4. A small pericardial effusion is present. The pericardial effusion is circumferential. There is no evidence of cardiac tamponade. Moderate pleural effusion in the left lateral region.  5. The mitral valve is normal in structure. Moderate mitral valve regurgitation. No evidence of mitral stenosis.  6. The aortic valve is tricuspid. Aortic valve regurgitation is not visualized. No aortic stenosis is present.  7. The inferior vena cava is dilated in size with >50% respiratory variability, suggesting right atrial pressure of 8 mmHg. FINDINGS  Left Ventricle: Left ventricular ejection fraction, by estimation, is 25 to 30%. The left ventricle has severely decreased function. The left ventricle demonstrates global hypokinesis. The average left ventricular global longitudinal strain is 9.0 %. The global longitudinal strain is abnormal. The left ventricular internal cavity size was mildly dilated. There is no left ventricular hypertrophy. Left ventricular diastolic parameters are consistent with Grade II diastolic dysfunction (pseudonormalization). Right Ventricle: The right ventricular size is normal. No increase in right ventricular wall thickness. Right ventricular systolic function is normal. Tricuspid regurgitation signal is inadequate for assessing PA pressure. Left Atrium: Left atrial size was  mildly dilated. Right Atrium: Right atrial size was normal in size. Pericardium: A small pericardial effusion is present. The pericardial effusion is circumferential. There is no evidence of cardiac tamponade. Mitral Valve: The mitral valve is normal in structure. Moderate mitral valve regurgitation. No evidence of mitral valve stenosis. Tricuspid Valve: The tricuspid valve is normal in structure. Tricuspid valve regurgitation is mild . No evidence of tricuspid stenosis. Aortic Valve: The aortic valve is tricuspid. Aortic valve regurgitation is not visualized. No aortic stenosis is present. Aortic valve mean gradient measures 2.0 mmHg. Aortic valve peak gradient measures 4.4 mmHg. Aortic valve area, by VTI measures 2.91 cm. Pulmonic Valve: The pulmonic valve was normal in structure. Pulmonic valve regurgitation is not visualized. No evidence of pulmonic stenosis. Aorta: The aortic root is normal in size and structure. Ascending aorta measurements are within normal limits for age when indexed to body surface area. Venous: The inferior vena cava is dilated in size with greater than 50% respiratory variability, suggesting right atrial pressure of 8 mmHg. IAS/Shunts: No atrial level shunt detected by color flow Doppler. Additional Comments: There is a moderate pleural effusion in the left lateral region.  LEFT VENTRICLE PLAX 2D LVIDd:         5.17 cm      Diastology LVIDs:         4.65 cm      LV e' medial:    2.83 cm/s LV PW:         1.45 cm      LV E/e' medial:  29.5 LV IVS:        1.00 cm      LV e' lateral:   6.85 cm/s LVOT diam:     2.00 cm  LV E/e' lateral: 12.2 LV SV:         56 LV SV Index:   33           2D Longitudinal Strain LVOT Area:     3.14 cm     2D Strain GLS Avg:     9.0 %  LV Volumes (MOD) LV vol d, MOD A2C: 183.0 ml LV vol d, MOD A4C: 152.0 ml LV vol s, MOD A2C: 111.0 ml LV vol s, MOD A4C: 115.0 ml LV SV MOD A2C:     72.0 ml LV SV MOD A4C:     152.0 ml LV SV MOD BP:      52.7 ml RIGHT VENTRICLE  RV Basal diam:  3.13 cm RV S prime:     10.00 cm/s TAPSE (M-mode): 2.5 cm LEFT ATRIUM             Index        RIGHT ATRIUM           Index LA diam:        4.50 cm 2.67 cm/m   RA Area:     15.50 cm LA Vol (A2C):   67.7 ml 40.19 ml/m  RA Volume:   40.80 ml  24.22 ml/m LA Vol (A4C):   73.0 ml 43.34 ml/m LA Biplane Vol: 70.3 ml 41.74 ml/m  AORTIC VALVE                    PULMONIC VALVE AV Area (Vmax):    3.20 cm     PV Vmax:       0.67 m/s AV Area (Vmean):   2.87 cm     PV Peak grad:  1.8 mmHg AV Area (VTI):     2.91 cm AV Vmax:           105.00 cm/s AV Vmean:          74.000 cm/s AV VTI:            0.192 m AV Peak Grad:      4.4 mmHg AV Mean Grad:      2.0 mmHg LVOT Vmax:         107.00 cm/s LVOT Vmean:        67.600 cm/s LVOT VTI:          0.178 m LVOT/AV VTI ratio: 0.93  AORTA Ao Root diam: 3.80 cm MITRAL VALVE MV Area (PHT): 7.82 cm       SHUNTS MV Decel Time: 97 msec        Systemic VTI:  0.18 m MR Peak grad:    129.5 mmHg   Systemic Diam: 2.00 cm MR Mean grad:    79.0 mmHg MR Vmax:         569.00 cm/s MR Vmean:        419.0 cm/s MR PISA:         1.57 cm MR PISA Eff ROA: 6 mm MR PISA Radius:  0.50 cm MV E velocity: 83.60 cm/s MV A velocity: 59.10 cm/s MV E/A ratio:  1.41 Ida Rogue MD Electronically signed by Ida Rogue MD Signature Date/Time: 06/26/2021/5:10:11 PM    Final    US RENAL  Result Date: 06/26/2021 CLINICAL DATA:  Acute kidney injury. EXAM: RENAL / URINARY TRACT ULTRASOUND COMPLETE COMPARISON:  Renal ultrasound 01/19/2019; ultrasound for ultrasound-guided paracentesis 06/25/2021; CT abdomen and pelvis 06/23/2021 FINDINGS: Right Kidney: Renal measurements: 10.7 x 6.1 x 6.3 cm = volume: 214 mL. Increased  cortical echogenicity is again seen. No mass or hydronephrosis visualized. Left Kidney: Renal measurements: 12.4 x 6.5 x 6.0 cm = volume: 253 mL. Increased cortical echogenicity is again seen. No mass or hydronephrosis visualized. Bladder: Possible mild irregularity of the bladder  wall, however this is diffuse. No definite focal mass. On recent 06/23/2021 CT, no focal urinary bladder wall thickening is seen. Other: Moderate ascites.  Left pleural fluid is noted. IMPRESSION: 1. There is again bilateral increased cortical echogenicity as can be seen with chronic medical renal disease. No hydronephrosis. 2. Moderate ascites. 3. Left pleural effusion. Bilateral pleural effusions were seen on recent CT. Electronically Signed   By: Yvonne Kendall M.D.   On: 06/26/2021 16:27   US Paracentesis  Result Date: 06/25/2021 INDICATION: Patient with history of CHF admitted with fluid overload and ascites on CT. Request to IR for diagnostic and therapeutic paracentesis. EXAM: ULTRASOUND GUIDED DIAGNOSTIC AND THERAPEUTIC PARACENTESIS MEDICATIONS: 10 mL 1% lidocaine COMPLICATIONS: None immediate. PROCEDURE: Informed written consent was obtained from the patient after a discussion of the risks, benefits and alternatives to treatment. A timeout was performed prior to the initiation of the procedure. Initial ultrasound scanning demonstrates a large amount of ascites within the right lower abdominal quadrant. The right lower abdomen was prepped and draped in the usual sterile fashion. 1% lidocaine was used for local anesthesia. Following this, a 19 gauge, 7-cm, Yueh catheter was introduced. An ultrasound image was saved for documentation purposes. The paracentesis was performed. The catheter was removed and a dressing was applied. The patient tolerated the procedure well without immediate post procedural complication. FINDINGS: A total of approximately 5.3 L of clear yellow fluid was removed. Samples were sent to the laboratory as requested by the clinical team. IMPRESSION: Successful ultrasound-guided paracentesis yielding 5.3 liters of peritoneal fluid. Read by Candiss Norse, PA-C Electronically Signed   By: Michaelle Birks M.D.   On: 06/25/2021 13:22   US SCROTUM W/DOPPLER  Result Date:  06/23/2021 CLINICAL DATA:  Testicular swelling EXAM: SCROTAL ULTRASOUND DOPPLER ULTRASOUND OF THE TESTICLES TECHNIQUE: Complete ultrasound examination of the testicles, epididymis, and other scrotal structures was performed. Color and spectral Doppler ultrasound were also utilized to evaluate blood flow to the testicles. COMPARISON:  CT AP, concurrent. FINDINGS: Right testicle Measurements: 4.6 x 3.2 x 3.3 cm. No mass or microlithiasis visualized. Left testicle Measurements: 4.5 x 2.7 x 3.0 cm. No mass or microlithiasis visualized. Pulsed Doppler interrogation of both testes demonstrates normal low resistance arterial and venous waveforms bilaterally. Right epididymis:  Normal in size and appearance. Left epididymis: Normal in size and appearance. Tiny epididymal head cyst. Hydrocele:  Bilateral hydroceles. Varicocele:  None visualized. Scrotal edema. LEFT ascitic inguinal hernia. Pelvic ascites, incompletely imaged IMPRESSION: 1. No sonographic or Doppler evidence of testicular torsion 2. Pelvic and LEFT inguinal ascites. Bilateral hydroceles, likely ascitic in origin 3. Scrotal edema, likely anasarca. Electronically Signed   By: Michaelle Birks M.D.   On: 06/23/2021 16:01   CT ABDOMEN PELVIS W CONTRAST  Result Date: 06/23/2021 CLINICAL DATA:  Bilateral groin and leg swelling. EXAM: CT ABDOMEN AND PELVIS WITH CONTRAST TECHNIQUE: Multidetector CT imaging of the abdomen and pelvis was performed using the standard protocol following bolus administration of intravenous contrast. RADIATION DOSE REDUCTION: This exam was performed according to the departmental dose-optimization program which includes automated exposure control, adjustment of the mA and/or kV according to patient size and/or use of iterative reconstruction technique. CONTRAST:  81mL OMNIPAQUE IOHEXOL 350 MG/ML SOLN COMPARISON:  CT  abdomen pelvis dated January 12, 2019. FINDINGS: Lower chest: Cardiomegaly. Small bilateral pleural effusions with adjacent  subsegmental atelectasis in both posterior lower lobes. Hepatobiliary: No focal liver abnormality. Decompressed gallbladder without gallstones or wall thickening. No biliary dilatation. Pancreas: Unremarkable. No pancreatic ductal dilatation or surrounding inflammatory changes. Spleen: Normal in size without focal abnormality. Adrenals/Urinary Tract: Adrenal glands are unremarkable. Kidneys are normal, without renal calculi, focal lesion, or hydronephrosis. Bladder is unremarkable. Stomach/Bowel: Stomach is within normal limits. Appendix appears normal. No evidence of bowel wall thickening, distention, or inflammatory changes. Vascular/Lymphatic: Aortic atherosclerosis. No enlarged abdominal or pelvic lymph nodes. Reproductive: Prostate is unremarkable. Other: Large intra-abdominal ascites, increased compared to prior. No pneumoperitoneum. Small umbilical and bilateral inguinal hernias. Musculoskeletal: Diffuse severe anasarca. No acute or significant osseous findings. IMPRESSION: 1. Severe fluid overload with large intra-abdominal ascites, diffuse anasarca, and small bilateral pleural effusions. 2. Aortic Atherosclerosis (ICD10-I70.0). Electronically Signed   By: Titus Dubin M.D.   On: 06/23/2021 15:59   DG Chest 2 View  Result Date: 06/23/2021 CLINICAL DATA:  Fluid overload, lower extremity swelling EXAM: CHEST - 2 VIEW COMPARISON:  05/11/2021 chest radiograph. FINDINGS: Stable cardiomediastinal silhouette with borderline mild cardiomegaly. No pneumothorax. Small bilateral pleural effusions. Mild pulmonary edema. Mild bibasilar curvilinear atelectasis. IMPRESSION: Mild congestive heart failure with small bilateral pleural effusions and bibasilar atelectasis. Electronically Signed   By: Ilona Sorrel M.D.   On: 06/23/2021 13:02    Microbiology: Results for orders placed or performed during the hospital encounter of 06/23/21  Urine Culture     Status: Abnormal   Collection Time: 06/23/21 11:10 AM    Specimen: Urine, Clean Catch  Result Value Ref Range Status   Specimen Description   Final    URINE, CLEAN CATCH Performed at [name], [address], [place], [place] [address]    Special Requests   Final    NONE Performed at [facility], [address] [place], [place] [address]    Culture MULTIPLE SPECIES PRESENT, SUGGEST RECOLLECTION (A)  Final   Report Status 06/24/2021 FINAL  Final  Body fluid culture w Gram Stain     Status: None   Collection Time: 06/25/21 11:29 AM   Specimen: Peritoneal Washings  Result Value Ref Range Status   Specimen Description   Final    PERITONEAL Performed at [name], [address], [place], [place] [address]    Special Requests   Final    NONE Performed at [facility], [address], [place], [place] [address]    Gram Stain NO WBC SEEN NO ORGANISMS SEEN   Final   Culture   Final    NO GROWTH 3 DAYS Performed at [facility], [address], [place], [place] [address]    Report Status 06/28/2021 FINAL  Final  MRSA Next Gen by PCR, Nasal     Status: None   Collection Time: 06/29/21  3:12 PM   Specimen: Nasal Mucosa; Nasal Swab  Result Value Ref Range Status   MRSA by PCR Next Gen NOT DETECTED NOT DETECTED Final    Comment: (NOTE) The GeneXpert MRSA Assay (FDA approved for NASAL specimens only), is one component of a comprehensive MRSA colonization surveillance program. It is not intended to diagnose MRSA infection nor to guide or monitor treatment for MRSA infections. Test performance is not FDA approved in patients less than 27 years old. Performed at [facility], [address], [place], [place] [address]  Labs: CBC: Recent Labs  Lab 07/01/21 0451 07/02/21 0513 07/03/21 0408  WBC 6.3 6.1 6.2  HGB 8.6* 8.5* 8.9*  HCT 26.9* 26.3* 28.1*  MCV 89.7 90.4 90.4  PLT 344 378 592   Basic Metabolic Panel: Recent Labs  Lab 07/01/21 0451 07/02/21 0513  07/03/21 0408 07/04/21 0448 07/05/21 0515 07/06/21 0600 07/07/21 0515  NA 136 139 137 136 137 137 138  K 4.0 4.0 4.1 4.2 3.7 3.6 3.8  CL 104 104 102 101 101 101 104  CO2 23 26 26 27 29 30 29   GLUCOSE 115* 195* 227* 431* 64* 132* 96  BUN 55* 53* 52* 59* 55* 58* 64*  CREATININE 3.93* 3.75* 3.41* 3.40* 3.18* 3.21* 3.15*  CALCIUM 8.5* 8.4* 8.9 8.8* 9.1 9.0 8.9  MG 1.7 2.3 1.8  --   --   --   --    Liver Function Tests: No results for input(s): "AST", "ALT", "ALKPHOS", "BILITOT", "PROT", "ALBUMIN" in the last 168 hours. CBG: Recent Labs  Lab 07/06/21 0732 07/06/21 1131 07/06/21 1700 07/06/21 2103 07/07/21 0739  GLUCAP 125* 114* 105* 110* 130*    Discharge time spent: greater than 30 minutes.  This record has been created using Systems analyst. Errors have been sought and corrected,but may not always be located. Such creation errors do not reflect on the standard of care.   Signed: Lorella Nimrod, MD [facility] 07/07/2021

## 2021-07-07 NOTE — Progress Notes (Signed)
Patient had a fall prior to this admission. Abrasions present on both knees. Right knee abrasion has tissue exposed, pink in center, and non-bleeding.

## 2021-07-07 NOTE — Progress Notes (Signed)
Progress Note  Patient Name: Shawn [name] Date of Encounter: 07/07/2021  Primary Cardiologist: Rockey Situ  Subjective   Documented UOP 1.3 L for the past 24 hours, net - 14.5 L for the admission. Renal function stable. Dyspnea much improved and at baseline. No chest pain. Feels like he is ready for discharge.   Inpatient Medications    Scheduled Meds:  aspirin  81 mg Oral Daily   carvedilol  3.125 mg Oral BID WC   Chlorhexidine Gluconate Cloth  6 each Topical Daily   furosemide  40 mg Oral BID   heparin injection (subcutaneous)  5,000 Units Subcutaneous Q8H   hydrALAZINE  75 mg Oral Q8H   insulin aspart  0-15 Units Subcutaneous TID WC   insulin aspart  6 Units Subcutaneous TID WC   insulin glargine-yfgn  10 Units Subcutaneous BID   isosorbide dinitrate  20 mg Oral Q8H   melatonin  5 mg Oral QHS   nicotine  21 mg Transdermal Daily   sodium chloride flush  3 mL Intravenous Q12H   Continuous Infusions:  sodium chloride Stopped (07/03/21 1417)   PRN Meds: sodium chloride, acetaminophen, docusate sodium, polyethylene glycol, simethicone, sodium chloride flush, traMADol   Vital Signs    Vitals:   07/06/21 1931 07/06/21 2339 07/07/21 0352 07/07/21 0737  BP: 139/77 120/77 137/81 119/67  Pulse: 86 78 77 77  Resp: 19 16 17 17   Temp: 98.1 F (36.7 C) 98.4 F (36.9 C) 98.5 F (36.9 C) 97.7 F (36.5 C)  TempSrc: Oral Oral Oral   SpO2: 99% 98% 97% 98%  Weight:   61.7 kg   Height:        Intake/Output Summary (Last 24 hours) at 07/07/2021 0853 Last data filed at 07/07/2021 0737 Gross per 24 hour  Intake --  Output 1500 ml  Net -1500 ml    Filed Weights   07/05/21 0500 07/06/21 0500 07/07/21 0352  Weight: 75.7 kg 64.8 kg 61.7 kg    Telemetry    SR, occasional PVCs - Personally Reviewed  ECG    No new tracings - Personally Reviewed  Physical Exam   GEN: thin, well developed in no acute distress NECK: No JVD CARDIAC: regular rhythm, normal S1 and S2, no  rubs or gallops. 2/6 systolic murmur. VASCULAR: Radial pulses 2+ bilaterally.  RESPIRATORY:  Clear to auscultation with improvement in basilar rales ABDOMEN: Soft, non-tender, non-distended MUSCULOSKELETAL:  Moves all 4 limbs independently SKIN: Warm and dry, bilateral trivial pitting LE edema NEUROLOGIC:  No focal neuro deficits noted. PSYCHIATRIC:  Normal affect    Labs    Chemistry Recent Labs  Lab 07/05/21 0515 07/06/21 0600 07/07/21 0515  NA 137 137 138  K 3.7 3.6 3.8  CL 101 101 104  CO2 29 30 29   GLUCOSE 64* 132* 96  BUN 55* 58* 64*  CREATININE 3.18* 3.21* 3.15*  CALCIUM 9.1 9.0 8.9  GFRNONAA 21* 21* 21*  ANIONGAP 7 6 5       Hematology Recent Labs  Lab 07/01/21 0451 07/02/21 0513 07/03/21 0408  WBC 6.3 6.1 6.2  RBC 3.00* 2.91* 3.11*  HGB 8.6* 8.5* 8.9*  HCT 26.9* 26.3* 28.1*  MCV 89.7 90.4 90.4  MCH 28.7 29.2 28.6  MCHC 32.0 32.3 31.7  RDW 14.8 14.8 15.2  PLT 344 378 388     Cardiac EnzymesNo results for input(s): "TROPONINI" in the last 168 hours. No results for input(s): "TROPIPOC" in the last 168 hours.   BNPNo results  for input(s): "BNP", "PROBNP" in the last 168 hours.   DDimer No results for input(s): "DDIMER" in the last 168 hours.   Radiology    No results found.  Cardiac Studies   RHC 06/29/2021: Conclusions: Moderately elevated left heart filling pressure (PCWP 28 mmHg). Moderately to severely elevated right heart and pulmonary artery pressures (mean RA 13 mmHg, RVEDP 15 mmHg, mean PAP 41 mmHg). Normal Fick cardiac output (Fick CO 4.5 L/min, CI 2.7 L/min/m^2) with low central venous oxygen saturation (SvO2 51%). Successful placement of 58F triple lumen catheter via the right internal jugular vein.   Recommendations: Transfer to stepdown for initiation of milrinone. Continue furosemide infusion; dose escalation will likely be needed based on response to milrinone. Monitor CVP every 4 hours and daily central venous oxygen  saturations. __________  2D echo 06/26/2021: 1. Left ventricular ejection fraction, by estimation, is 25 to 30%. The  left ventricle has severely decreased function. The left ventricle  demonstrates global hypokinesis. The left ventricular internal cavity size  was mildly dilated. Left ventricular  diastolic parameters are consistent with Grade II diastolic dysfunction  (pseudonormalization). The average left ventricular global longitudinal  strain is 9.0 %. The global longitudinal strain is abnormal.   2. Right ventricular systolic function is normal. The right ventricular  size is normal. Tricuspid regurgitation signal is inadequate for assessing  PA pressure.   3. Left atrial size was mildly dilated.   4. A small pericardial effusion is present. The pericardial effusion is  circumferential. There is no evidence of cardiac tamponade. Moderate  pleural effusion in the left lateral region.   5. The mitral valve is normal in structure. Moderate mitral valve  regurgitation. No evidence of mitral stenosis.   6. The aortic valve is tricuspid. Aortic valve regurgitation is not  visualized. No aortic stenosis is present.   7. The inferior vena cava is dilated in size with >50% respiratory  variability, suggesting right atrial pressure of 8 mmHg. __________  2D echo 01/22/2021: 1. Left ventricular ejection fraction, by estimation, is 40 to 45%. The  left ventricle has mildly decreased function. The left ventricle  demonstrates global hypokinesis. There is severe left ventricular  hypertrophy. Left ventricular diastolic parameters   are consistent with Grade I diastolic dysfunction (impaired relaxation)  Shiny speckled appearing myocardium with severe LVH, mild valve  thickening, small pericardial effusion. Findings could suggest cardiac  amyolidosis.   2. Right ventricular systolic function is normal. The right ventricular  size is normal. Tricuspid regurgitation signal is inadequate for  assessing  PA pressure.   3. A small pericardial effusion is present. The pericardial effusion is  circumferential.   4. The mitral valve is normal in structure. No evidence of mitral valve  regurgitation. No evidence of mitral stenosis.   5. The aortic valve is tricuspid. There is mild calcification of the  aortic valve. There is mild thickening of the aortic valve. Aortic valve  regurgitation is not visualized. No aortic stenosis is present.   6. The inferior vena cava is normal in size with greater than 50%  respiratory variability, suggesting right atrial pressure of 3 mmHg. __________  2D echo 05/11/2018: 1. The left ventricle has severely reduced systolic function, with an  ejection fraction of 25-30%. The cavity size was mildly dilated. There is  mildly increased left ventricular wall thickness. Left ventricular  diastolic Doppler parameters are  consistent with pseudonormalization. Left ventrical global hypokinesis  without regional wall motion abnormalities.   2. The right  ventricle has normal systolic function. The cavity was  normal. There is no increase in right ventricular wall thickness.Unable to  estimate RVSP   3. Left atrial size was mildly dilated.   4. Mitral valve regurgitation is mild to moderate  __________  2D echo 04/29/2017: - Left ventricle: The cavity size was normal. There was moderate    concentric hypertrophy. Systolic function was severely reduced.    The estimated ejection fraction was in the range of 20% to 25%.    Diffuse hypokinesis. Regional wall motion abnormalities cannot be    excluded. Doppler parameters are consistent with abnormal left    ventricular relaxation (grade 1 diastolic dysfunction).  - Left atrium: The atrium was normal in size.  - Right ventricle: Systolic function was normal.  - Pulmonary arteries: Systolic pressure could not be accurately    estimated.  Patient Profile     66 y.o. male with history of chronic HFrEF due to  reported NICM, CKD stage III, DM 2, CVA, and HTN admitted with acute on chronic HFrEF.  Assessment & Plan    1.  Acute on chronic HFrEF due to suspected NICM: -Volume status much improved -Renal function and UOP have remained stable off inotropic support -Continue 3.125 mg BID carvedilol, hydralazine 75 mg tibd and Isordil 30 mg tid -Cannot use ACEi/ARB/ARNI/MRA/SGLT2i given renal function -Initial weight on 6/20 incorrect. Peak weight 76.6 kg, currently 61.7 kg -Outpatient ischemic evaluation via cardiac cath or MPI can be considered once overall status and renal function improve -CHF education -Suspect he is nearing, if not ready, for discharge   2.  Acute on CKD stage IIIa: -Cr 05/14/21 was 1.41, GFR 56, consistent with CKD stage 3a -Peak Cr this admission 4.26, current 3.15, still significantly over recent baseline. Admission Cr 1.27 -Appreciate nephrology assistance  3.  HTN: -Blood pressure improved with escalation of medical therapy as outlined above  4. Type II diabetes -with current renal function, cannot use ACEi/ARB/ARNI/MRA/SGLT2i     For questions or updates, please contact [facility] Please consult [email or web address] for contact info under Cardiology/STEMI.    Signed, Christell Faith, PA-C 07/07/2021, 8:53 AM

## 2021-07-07 NOTE — Progress Notes (Signed)
[facility]  ROUNDING NOTE   Subjective:   Shawn [name] is a 66 y.o. AA male with past medical conditions of CVA, diabetes, CHF, hypertension, and chronic kidney disease Iib. Patient presented to ED with lower extremity swelling and scrotal. Patient is admitted for Anasarca [R60.1] Testicle swelling [N50.89] Acute on chronic congestive heart failure, unspecified heart failure type [name]) [I50.9]  Patient is known to our clinic and is followed by Dr Candiss Norse.   Update: Late entry: Patient seen earlier in the day prior to discharge.  He was in good spirits.  Volume status much improved as compared to admission.   Objective:  Vital signs in last 24 hours:  Temp:  [97.7 F (36.5 C)-98.5 F (36.9 C)] 97.7 F (36.5 C) (07/04 0737) Pulse Rate:  [77-86] 86 (07/04 1139) Resp:  [16-19] 19 (07/04 1139) BP: (119-149)/(67-85) 133/76 (07/04 1139) SpO2:  [97 %-99 %] 98 % (07/04 1139) Weight:  [61.7 kg] 61.7 kg (07/04 0352)  Weight change: -3.065 kg Filed Weights   07/05/21 0500 07/06/21 0500 07/07/21 0352  Weight: 75.7 kg 64.8 kg 61.7 kg    Intake/Output: I/O last 3 completed shifts: In: 120 [P.O.:120] Out: 2200 [Urine:2200]   Intake/Output this shift:  Total I/O In: -  Out: 250 [Urine:250]  Physical Exam: General: NAD  Head: Normocephalic, atraumatic. Moist oral mucosal membranes  Eyes: Anicteric  Lungs:  Clear to auscultation, normal effort  Heart: Regular rate and rhythm  Abdomen:  Soft, nontender  Extremities: No bilateral lower extremity edema  Neurologic: Nonfocal, moving all four extremities  Skin: No lesions  Access: None    Basic Metabolic Panel: Recent Labs  Lab 07/01/21 0451 07/02/21 0513 07/03/21 0408 07/04/21 0448 07/05/21 0515 07/06/21 0600 07/07/21 0515  NA 136 139 137 136 137 137 138  K 4.0 4.0 4.1 4.2 3.7 3.6 3.8  CL 104 104 102 101 101 101 104  CO2 23 26 26 27 29 30 29   GLUCOSE 115* 195* 227* 431* 64* 132* 96  BUN 55* 53* 52*  59* 55* 58* 64*  CREATININE 3.93* 3.75* 3.41* 3.40* 3.18* 3.21* 3.15*  CALCIUM 8.5* 8.4* 8.9 8.8* 9.1 9.0 8.9  MG 1.7 2.3 1.8  --   --   --   --      Liver Function Tests: No results for input(s): "AST", "ALT", "ALKPHOS", "BILITOT", "PROT", "ALBUMIN" in the last 168 hours.  No results for input(s): "LIPASE", "AMYLASE" in the last 168 hours. No results for input(s): "AMMONIA" in the last 168 hours.  CBC: Recent Labs  Lab 07/01/21 0451 07/02/21 0513 07/03/21 0408  WBC 6.3 6.1 6.2  HGB 8.6* 8.5* 8.9*  HCT 26.9* 26.3* 28.1*  MCV 89.7 90.4 90.4  PLT 344 378 388     Cardiac Enzymes: No results for input(s): "CKTOTAL", "CKMB", "CKMBINDEX", "TROPONINI" in the last 168 hours.  BNP: Invalid input(s): "POCBNP"  CBG: Recent Labs  Lab 07/06/21 1131 07/06/21 1700 07/06/21 2103 07/07/21 0739 07/07/21 1135  GLUCAP 114* 105* 110* 130* 291*     Microbiology: Results for orders placed or performed during the hospital encounter of 06/23/21  Urine Culture     Status: Abnormal   Collection Time: 06/23/21 11:10 AM   Specimen: Urine, Clean Catch  Result Value Ref Range Status   Specimen Description   Final    URINE, CLEAN CATCH Performed at [facility], [address] [place], [place] [address]    Special Requests   Final    NONE  Performed at [facility], [address] [place], [place] [address]    Culture MULTIPLE SPECIES PRESENT, SUGGEST RECOLLECTION (A)  Final   Report Status 06/24/2021 FINAL  Final  Body fluid culture w Gram Stain     Status: None   Collection Time: 06/25/21 11:29 AM   Specimen: Peritoneal Washings  Result Value Ref Range Status   Specimen Description   Final    PERITONEAL Performed at [facility], [address] [place], [place] [address]    Special Requests   Final    NONE Performed at [facility], [place], [place] [address]    Gram Stain NO WBC SEEN NO ORGANISMS SEEN   Final    Culture   Final    NO GROWTH 3 DAYS Performed at [facility], [place] [address] [place], [place] [address]    Report Status 06/28/2021 FINAL  Final  MRSA Next Gen by PCR, Nasal     Status: None   Collection Time: 06/29/21  3:12 PM   Specimen: Nasal Mucosa; Nasal Swab  Result Value Ref Range Status   MRSA by PCR Next Gen NOT DETECTED NOT DETECTED Final    Comment: (NOTE) The GeneXpert MRSA Assay (FDA approved for NASAL specimens only), is one component of a comprehensive MRSA colonization surveillance program. It is not intended to diagnose MRSA infection nor to guide or monitor treatment for MRSA infections. Test performance is not FDA approved in patients less than 35 years old. Performed at [facility], [address] [place], [place] [address]     Coagulation Studies: No results for input(s): "LABPROT", "INR" in the last 72 hours.  Urinalysis: No results for input(s): "COLORURINE", "LABSPEC", "PHURINE", "GLUCOSEU", "HGBUR", "BILIRUBINUR", "KETONESUR", "PROTEINUR", "UROBILINOGEN", "NITRITE", "LEUKOCYTESUR" in the last 72 hours.  Invalid input(s): "APPERANCEUR"    Imaging: No results found.   Medications:    sodium chloride Stopped (07/03/21 1417)    aspirin  81 mg Oral Daily   carvedilol  3.125 mg Oral BID WC   Chlorhexidine Gluconate Cloth  6 each Topical Daily   furosemide  40 mg Oral BID   heparin injection (subcutaneous)  5,000 Units Subcutaneous Q8H   hydrALAZINE  75 mg Oral Q8H   insulin aspart  0-15 Units Subcutaneous TID WC   insulin aspart  6 Units Subcutaneous TID WC   insulin glargine-yfgn  10 Units Subcutaneous BID   isosorbide dinitrate  20 mg Oral Q8H   melatonin  5 mg Oral QHS   nicotine  21 mg Transdermal Daily   sodium chloride flush  3 mL Intravenous Q12H   sodium chloride, acetaminophen, docusate sodium, polyethylene glycol, simethicone, sodium chloride flush, traMADol  Assessment/ Plan:  Mr. CAILEB [name] is a 66 y.o.   male with past medical conditions of CVA, diabetes, CHF, hypertension, and chronic kidney disease IIib. Patient presented to ED with lower extremity swelling and scrotal. Patient is admitted for Anasarca [R60.1] Testicle swelling [N50.89] Acute on chronic congestive heart failure, unspecified heart failure type ([facility]) [I50.9]   Acute Kidney Injury on chronic kidney disease stage IIIb with baseline creatinine 1.27 06/23/21.  Acute kidney injury likely secondary to IV contrast exposure and fluid shift as well as cardiorenal syndrome.  The patient's renal function still well above his prior baseline with creatinine now at 3.15.  However he has had a significant amount of diuresis.  Suspect that he will equilibrate in terms of his creatinine over the next  several weeks.  He will need to follow-up with Dr. Candiss Norse in our office in the next several weeks.  Lab Results  Component Value Date   CREATININE 3.15 (H) 07/07/2021   CREATININE 3.21 (H) 07/06/2021   CREATININE 3.18 (H) 07/05/2021    Intake/Output Summary (Last 24 hours) at 07/07/2021 1444 Last data filed at 07/07/2021 1138 Gross per 24 hour  Intake --  Output 850 ml  Net -850 ml    2. Hypertension with chronic kidney disease.  Home regimen includes Carvedilol and entresto. Entresto currently held.     Continue to monitor blood pressure as an outpatient.   3. 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 Home regimen includes NovoLog and glargine Most recent hemoglobin A1c is 11.9 on 06/24/21.   Glucose well managed by primary team.  4.anemia of chronic kidney disease with iron deficiency.   Latest Reference Range & Units 06/27/21 07:01  Iron 45 - 182 ug/dL 24 (L)  UIBC ug/dL 210  TIBC 250 - 450 ug/dL 234 (L)  Saturation Ratios 17.9 - 39.5 % 10 (L)  Ferritin 24 - 336 ng/mL 59  Folate >5.9 ng/mL 25.0  (L): Data is abnormally low  Reevaluate hemoglobin and consider Epogen as an outpatient.  5. Acute  urinary retention Foley catheter removed and patient voiding well.  6.  Acute on chronic systolic heart failure. ECHO on 06/26/21 shows EF 25-30% global hypokinesis, grade 2 diastolic dysfunction, moderate mitral regurgitation, and small pericardial effusion.   Right heart cath on 06/29/21, found to have elevated left heart filling pressure and moderately to severely elevated right heart and pulmonary artery pressures.  Patient required milrinone and Lasix drip over the course of this admission.  Now significantly improved in terms of volume status.  Continue with lasix 40mg  PO BID at home and reassess dose as outpt.     LOS: 13 Micaylah Bertucci 7/4/20232:44 PM

## 2021-07-07 NOTE — Progress Notes (Signed)
Blood draw for Complete Metabolic Panel completed

## 2021-07-07 NOTE — Progress Notes (Signed)
Patient's wound dressing change performed on both knees.

## 2021-07-07 NOTE — Assessment & Plan Note (Signed)
Improving. Nephrology and cardiology on board.   -Continue to monitor

## 2021-07-16 ENCOUNTER — Ambulatory Visit: Payer: 59 | Admitting: Family

## 2021-07-16 ENCOUNTER — Telehealth: Payer: Self-pay | Admitting: Family

## 2021-07-16 NOTE — Telephone Encounter (Signed)
Patient did not show for his Heart Failure Clinic appointment on 07/16/21. Will attempt to reschedule.

## 2021-08-13 ENCOUNTER — Inpatient Hospital Stay
Admission: EM | Admit: 2021-08-13 | Discharge: 2021-08-15 | DRG: 291 | Disposition: A | Discharge status: Home / Self Care | Payer: No Typology Code available for payment source | Attending: Internal Medicine | Admitting: Internal Medicine

## 2021-08-13 ENCOUNTER — Other Ambulatory Visit: Payer: 59 | Admitting: Nurse Practitioner

## 2021-08-13 ENCOUNTER — Encounter: Payer: Self-pay | Admitting: Emergency Medicine

## 2021-08-13 ENCOUNTER — Other Ambulatory Visit: Payer: Self-pay

## 2021-08-13 ENCOUNTER — Emergency Department: Payer: No Typology Code available for payment source

## 2021-08-13 DIAGNOSIS — I428 Other cardiomyopathies: Secondary | ICD-10-CM | POA: Diagnosis present

## 2021-08-13 DIAGNOSIS — R197 Diarrhea, unspecified: Secondary | ICD-10-CM

## 2021-08-13 DIAGNOSIS — N189 Chronic kidney disease, unspecified: Secondary | ICD-10-CM | POA: Diagnosis present

## 2021-08-13 DIAGNOSIS — K219 Gastro-esophageal reflux disease without esophagitis: Secondary | ICD-10-CM | POA: Diagnosis present

## 2021-08-13 DIAGNOSIS — Z888 Allergy status to other drugs, medicaments and biological substances status: Secondary | ICD-10-CM

## 2021-08-13 DIAGNOSIS — Z8249 Family history of ischemic heart disease and other diseases of the circulatory system: Secondary | ICD-10-CM

## 2021-08-13 DIAGNOSIS — Z6821 Body mass index (BMI) 21.0-21.9, adult: Secondary | ICD-10-CM

## 2021-08-13 DIAGNOSIS — I5043 Acute on chronic combined systolic (congestive) and diastolic (congestive) heart failure: Secondary | ICD-10-CM | POA: Diagnosis present

## 2021-08-13 DIAGNOSIS — R7989 Other specified abnormal findings of blood chemistry: Secondary | ICD-10-CM | POA: Diagnosis present

## 2021-08-13 DIAGNOSIS — F17200 Nicotine dependence, unspecified, uncomplicated: Secondary | ICD-10-CM | POA: Diagnosis present

## 2021-08-13 DIAGNOSIS — Z833 Family history of diabetes mellitus: Secondary | ICD-10-CM

## 2021-08-13 DIAGNOSIS — I509 Heart failure, unspecified: Secondary | ICD-10-CM

## 2021-08-13 DIAGNOSIS — I10 Essential (primary) hypertension: Secondary | ICD-10-CM | POA: Diagnosis present

## 2021-08-13 DIAGNOSIS — R531 Weakness: Secondary | ICD-10-CM

## 2021-08-13 DIAGNOSIS — E782 Mixed hyperlipidemia: Secondary | ICD-10-CM

## 2021-08-13 DIAGNOSIS — N179 Acute kidney failure, unspecified: Secondary | ICD-10-CM | POA: Diagnosis present

## 2021-08-13 DIAGNOSIS — I130 Hypertensive heart and chronic kidney disease with heart failure and stage 1 through stage 4 chronic kidney disease, or unspecified chronic kidney disease: Principal | ICD-10-CM | POA: Diagnosis present

## 2021-08-13 DIAGNOSIS — R778 Other specified abnormalities of plasma proteins: Secondary | ICD-10-CM | POA: Diagnosis not present

## 2021-08-13 DIAGNOSIS — E1165 Type 2 diabetes mellitus with hyperglycemia: Secondary | ICD-10-CM | POA: Diagnosis present

## 2021-08-13 DIAGNOSIS — Z794 Long term (current) use of insulin: Secondary | ICD-10-CM

## 2021-08-13 DIAGNOSIS — E11649 Type 2 diabetes mellitus with hypoglycemia without coma: Secondary | ICD-10-CM | POA: Diagnosis not present

## 2021-08-13 DIAGNOSIS — Z79899 Other long term (current) drug therapy: Secondary | ICD-10-CM

## 2021-08-13 DIAGNOSIS — R188 Other ascites: Secondary | ICD-10-CM | POA: Diagnosis present

## 2021-08-13 DIAGNOSIS — E43 Unspecified severe protein-calorie malnutrition: Secondary | ICD-10-CM | POA: Diagnosis present

## 2021-08-13 DIAGNOSIS — F17210 Nicotine dependence, cigarettes, uncomplicated: Secondary | ICD-10-CM | POA: Diagnosis present

## 2021-08-13 DIAGNOSIS — F1010 Alcohol abuse, uncomplicated: Secondary | ICD-10-CM | POA: Diagnosis present

## 2021-08-13 DIAGNOSIS — E785 Hyperlipidemia, unspecified: Secondary | ICD-10-CM | POA: Diagnosis present

## 2021-08-13 DIAGNOSIS — R601 Generalized edema: Secondary | ICD-10-CM | POA: Diagnosis not present

## 2021-08-13 DIAGNOSIS — A0472 Enterocolitis due to Clostridium difficile, not specified as recurrent: Secondary | ICD-10-CM | POA: Diagnosis present

## 2021-08-13 DIAGNOSIS — F1910 Other psychoactive substance abuse, uncomplicated: Secondary | ICD-10-CM | POA: Diagnosis present

## 2021-08-13 DIAGNOSIS — Z7982 Long term (current) use of aspirin: Secondary | ICD-10-CM

## 2021-08-13 DIAGNOSIS — E1122 Type 2 diabetes mellitus with diabetic chronic kidney disease: Secondary | ICD-10-CM | POA: Diagnosis present

## 2021-08-13 DIAGNOSIS — I248 Other forms of acute ischemic heart disease: Secondary | ICD-10-CM | POA: Diagnosis present

## 2021-08-13 DIAGNOSIS — N1832 Chronic kidney disease, stage 3b: Secondary | ICD-10-CM | POA: Diagnosis present

## 2021-08-13 DIAGNOSIS — Z8673 Personal history of transient ischemic attack (TIA), and cerebral infarction without residual deficits: Secondary | ICD-10-CM

## 2021-08-13 DIAGNOSIS — E119 Type 2 diabetes mellitus without complications: Secondary | ICD-10-CM

## 2021-08-13 DIAGNOSIS — Z91148 Patient's other noncompliance with medication regimen for other reason: Secondary | ICD-10-CM

## 2021-08-13 LAB — GASTROINTESTINAL PANEL BY PCR, STOOL (REPLACES STOOL CULTURE)

## 2021-08-13 LAB — COMPREHENSIVE METABOLIC PANEL
ALT: 10 U/L (ref 0–44)
AST: 18 U/L (ref 15–41)
Albumin: 2.8 g/dL — ABNORMAL LOW (ref 3.5–5.0)
Alkaline Phosphatase: 113 U/L (ref 38–126)
Anion gap: 7 (ref 5–15)
BUN: 39 mg/dL — ABNORMAL HIGH (ref 8–23)
CO2: 21 mmol/L — ABNORMAL LOW (ref 22–32)
Calcium: 8.3 mg/dL — ABNORMAL LOW (ref 8.9–10.3)
Chloride: 108 mmol/L (ref 98–111)
Creatinine, Ser: 2.21 mg/dL — ABNORMAL HIGH (ref 0.61–1.24)
GFR, Estimated: 32 mL/min — ABNORMAL LOW (ref >60)
Glucose, Bld: 413 mg/dL — ABNORMAL HIGH (ref 70–99)
Potassium: 3.5 mmol/L (ref 3.5–5.1)
Sodium: 136 mmol/L (ref 135–145)
Total Bilirubin: 0.6 mg/dL (ref 0.3–1.2)
Total Protein: 6.5 g/dL (ref 6.5–8.1)

## 2021-08-13 LAB — URINALYSIS, ROUTINE W REFLEX MICROSCOPIC
Bacteria, UA: NONE SEEN
Bilirubin Urine: NEGATIVE
Glucose, UA: >=500 mg/dL — AB
Hgb urine dipstick: NEGATIVE
Ketones, ur: NEGATIVE mg/dL
Leukocytes,Ua: NEGATIVE
Nitrite: NEGATIVE
Protein, ur: >=300 mg/dL — AB
Specific Gravity, Urine: 1.016 (ref 1.005–1.030)
pH: 5 (ref 5.0–8.0)

## 2021-08-13 LAB — C DIFFICILE QUICK SCREEN W PCR REFLEX
C Diff antigen: POSITIVE — AB
C Diff toxin: NEGATIVE

## 2021-08-13 LAB — CBC
HCT: 36.2 % — ABNORMAL LOW (ref 39.0–52.0)
Hemoglobin: 11 g/dL — ABNORMAL LOW (ref 13.0–17.0)
MCH: 28.4 pg (ref 26.0–34.0)
MCHC: 30.4 g/dL (ref 30.0–36.0)
MCV: 93.5 fL (ref 80.0–100.0)
Platelets: 350 10*3/uL (ref 150–400)
RBC: 3.87 MIL/uL — ABNORMAL LOW (ref 4.22–5.81)
RDW: 17.8 % — ABNORMAL HIGH (ref 11.5–15.5)
WBC: 4.1 10*3/uL (ref 4.0–10.5)
nRBC: 0.5 % — ABNORMAL HIGH (ref 0.0–0.2)

## 2021-08-13 LAB — TROPONIN I (HIGH SENSITIVITY)
Troponin I (High Sensitivity): 36 ng/L — ABNORMAL HIGH (ref <18)
Troponin I (High Sensitivity): 40 ng/L — ABNORMAL HIGH (ref <18)
Troponin I (High Sensitivity): 37 ng/L — ABNORMAL HIGH (ref <18)

## 2021-08-13 LAB — CK: Total CK: 96 U/L (ref 49–397)

## 2021-08-13 LAB — BRAIN NATRIURETIC PEPTIDE: B Natriuretic Peptide: 3781.8 pg/mL — ABNORMAL HIGH (ref 0.0–100.0)

## 2021-08-13 LAB — LIPASE, BLOOD: Lipase: 25 U/L (ref 11–51)

## 2021-08-13 LAB — CLOSTRIDIUM DIFFICILE BY PCR, REFLEXED: Toxigenic C. Difficile by PCR: POSITIVE — AB

## 2021-08-13 LAB — GLUCOSE, CAPILLARY: Glucose-Capillary: 248 mg/dL — ABNORMAL HIGH (ref 70–99)

## 2021-08-13 MED ORDER — GLUCERNA SHAKE PO LIQD
237.0000 mL | Freq: Three times a day (TID) | ORAL | Status: DC
  Administered 2021-08-13 – 2021-08-14 (×3): 237 mL via ORAL

## 2021-08-13 MED ORDER — CARVEDILOL 3.125 MG PO TABS
3.1250 mg | ORAL_TABLET | Freq: Two times a day (BID) | ORAL | Status: DC
  Administered 2021-08-13 – 2021-08-15 (×4): 3.125 mg via ORAL
  Filled 2021-08-13 (×4): qty 1

## 2021-08-13 MED ORDER — HEPARIN SODIUM (PORCINE) 5000 UNIT/ML IJ SOLN
5000.0000 [IU] | Freq: Three times a day (TID) | INTRAMUSCULAR | Status: DC
  Administered 2021-08-13 – 2021-08-14 (×4): 5000 [IU] via SUBCUTANEOUS
  Filled 2021-08-13 (×5): qty 1

## 2021-08-13 MED ORDER — ONDANSETRON HCL 4 MG PO TABS: 4.0000 mg | ORAL_TABLET | Freq: Four times a day (QID) | ORAL | Status: DC | PRN

## 2021-08-13 MED ORDER — ADULT MULTIVITAMIN W/MINERALS CH
1.0000 | ORAL_TABLET | Freq: Every day | ORAL | Status: DC
  Administered 2021-08-13 – 2021-08-15 (×3): 1 via ORAL
  Filled 2021-08-13 (×3): qty 1

## 2021-08-13 MED ORDER — INSULIN ASPART 100 UNIT/ML IJ SOLN
0.0000 [IU] | Freq: Three times a day (TID) | INTRAMUSCULAR | Status: DC
  Filled 2021-08-13 (×2): qty 1

## 2021-08-13 MED ORDER — HYDRALAZINE HCL 50 MG PO TABS
75.0000 mg | ORAL_TABLET | Freq: Three times a day (TID) | ORAL | Status: DC
  Administered 2021-08-13 – 2021-08-15 (×5): 75 mg via ORAL
  Filled 2021-08-13 (×5): qty 1

## 2021-08-13 MED ORDER — ISOSORBIDE DINITRATE 20 MG PO TABS
20.0000 mg | ORAL_TABLET | Freq: Three times a day (TID) | ORAL | Status: DC
  Administered 2021-08-13 – 2021-08-15 (×5): 20 mg via ORAL
  Filled 2021-08-13 (×6): qty 1

## 2021-08-13 MED ORDER — NICOTINE 21 MG/24HR TD PT24: 21.0000 mg | MEDICATED_PATCH | Freq: Every day | TRANSDERMAL | Status: DC | PRN

## 2021-08-13 MED ORDER — INSULIN GLARGINE-YFGN 100 UNIT/ML ~~LOC~~ SOLN
10.0000 [IU] | Freq: Every day | SUBCUTANEOUS | Status: AC
Start: 2021-08-13 — End: 2021-08-13
  Administered 2021-08-13: 10 [IU] via SUBCUTANEOUS
  Filled 2021-08-13: qty 0.1

## 2021-08-13 MED ORDER — SENNOSIDES-DOCUSATE SODIUM 8.6-50 MG PO TABS: 1.0000 | ORAL_TABLET | Freq: Every evening | ORAL | Status: DC | PRN

## 2021-08-13 MED ORDER — ACETAMINOPHEN 325 MG PO TABS
650.0000 mg | ORAL_TABLET | Freq: Four times a day (QID) | ORAL | Status: DC | PRN
Start: 2021-08-13 — End: 2021-08-15

## 2021-08-13 MED ORDER — ASPIRIN 81 MG PO CHEW
81.0000 mg | CHEWABLE_TABLET | Freq: Every day | ORAL | Status: DC
  Administered 2021-08-14 – 2021-08-15 (×2): 81 mg via ORAL
  Filled 2021-08-13 (×2): qty 1

## 2021-08-13 MED ORDER — VITAMIN D 25 MCG (1000 UNIT) PO TABS
2000.0000 [IU] | ORAL_TABLET | Freq: Every day | ORAL | Status: DC
  Administered 2021-08-14 – 2021-08-15 (×2): 2000 [IU] via ORAL
  Filled 2021-08-13 (×2): qty 2

## 2021-08-13 MED ORDER — ONDANSETRON HCL 4 MG/2ML IJ SOLN
4.0000 mg | Freq: Four times a day (QID) | INTRAMUSCULAR | Status: DC | PRN
Start: 2021-08-13 — End: 2021-08-15

## 2021-08-13 MED ORDER — LABETALOL HCL 5 MG/ML IV SOLN: 5.0000 mg | INTRAVENOUS | Status: DC | PRN

## 2021-08-13 MED ORDER — FUROSEMIDE 10 MG/ML IJ SOLN
60.0000 mg | Freq: Every day | INTRAMUSCULAR | Status: AC
Start: 2021-08-14 — End: 2021-08-14
  Administered 2021-08-14: 60 mg via INTRAVENOUS
  Filled 2021-08-13: qty 6

## 2021-08-13 MED ORDER — INSULIN ASPART 100 UNIT/ML IJ SOLN
0.0000 [IU] | Freq: Every day | INTRAMUSCULAR | Status: DC
  Administered 2021-08-13: 2 [IU] via SUBCUTANEOUS
  Filled 2021-08-13: qty 1

## 2021-08-13 MED ORDER — INSULIN GLARGINE-YFGN 100 UNIT/ML ~~LOC~~ SOLN
20.0000 [IU] | Freq: Every day | SUBCUTANEOUS | Status: DC
Start: 2021-08-13 — End: 2021-08-15
  Administered 2021-08-13 – 2021-08-15 (×2): 20 [IU] via SUBCUTANEOUS
  Filled 2021-08-13 (×3): qty 0.2

## 2021-08-13 MED ORDER — ATORVASTATIN CALCIUM 20 MG PO TABS
40.0000 mg | ORAL_TABLET | Freq: Every day | ORAL | Status: DC
  Administered 2021-08-13 – 2021-08-14 (×2): 40 mg via ORAL
  Filled 2021-08-13 (×2): qty 2

## 2021-08-13 MED ORDER — FUROSEMIDE 10 MG/ML IJ SOLN
80.0000 mg | Freq: Once | INTRAMUSCULAR | Status: AC
  Administered 2021-08-13: 80 mg via INTRAVENOUS
  Filled 2021-08-13: qty 8

## 2021-08-13 MED ORDER — ACETAMINOPHEN 650 MG RE SUPP: 650.0000 mg | Freq: Four times a day (QID) | RECTAL | Status: DC | PRN

## 2021-08-13 MED ORDER — IOHEXOL 300 MG/ML  SOLN
75.0000 mL | Freq: Once | INTRAMUSCULAR | Status: AC | PRN
  Administered 2021-08-13: 75 mL via INTRAVENOUS

## 2021-08-13 NOTE — Assessment & Plan Note (Addendum)
Had an episode of hypoglycemia this morning requiring D50. - Long-acting insulin glargine 20 units daily -holding for today due to hypoglycemia. - Insulin SSI with at bedtime coverage ordered - Goal inpatient blood glucose levels 140-180

## 2021-08-13 NOTE — Assessment & Plan Note (Addendum)
Blood pressure within goal - Continue hydralazine 75 mg every 8 hours,  -Continue carvedilol 3.125 mg p.o. twice daily

## 2021-08-13 NOTE — Assessment & Plan Note (Signed)
-   As needed nicotine patch ordered ?

## 2021-08-13 NOTE — Assessment & Plan Note (Addendum)
C. difficile PCR came back positive.  Denies any recent antibiotic use but sister was sick with similar symptoms. Continue to have abdominal pain and diarrhea, no leukocytosis. -Start him on fidaxomicin for 10 days -Encourage p.o. hydration

## 2021-08-13 NOTE — ED Provider Notes (Signed)
[facility] Provider Note    Event Date/Time   First MD Initiated Contact with Patient 08/13/21 1329     (approximate)   History   Abdominal Pain   HPI  Shawn [name] is a 66 y.o. male with a past medical history of type 2 diabetes, CKD stage III, polysubstance abuse, nonischemic cardiomyopathy, hypertension, CVA, hyperlipidemia, anasarca who presents today for evaluation of abdominal pain, diarrhea, and swelling for the past couple of weeks.  Patient reports that he has had approximately 6 stools a day for the past week, however he feels that it worsened last night and he had to sleep in the bathtub because he was pooping so much.  He reports that he estimates that he has gone approximately 10 times today.  He denies any blood in his stool.  He feels that his abdomen might be slightly more distended than usual, and feels cramping abdominal pain.  He denies any fevers or chills.  He denies back pain.  He denies paresthesias.  He is still able to ambulate but does report feeling generalized weakness.  He reports that he has swelling in his bilateral lower extremities that he feels is worse than his baseline.  He estimates that he has gained approximately 10 pounds this week.  He has been using an extra pillow to sleep.  He denies feeling chest pain or shortness of breath.  Patient Active Problem List   Diagnosis Date Noted   Acute exacerbation of CHF (congestive heart failure) ([place]) 08/13/2021   Weakness 08/13/2021   Iron deficiency anemia 07/01/2021   Anasarca 06/23/2021   Hyperosmolar hyperglycemic state (HHS) ([place]) 46/27/0350   Acute metabolic encephalopathy 09/38/1829   Elevated troponin 03/16/2020   HLD (hyperlipidemia) 03/16/2020   Abnormal CXR 03/16/2020   Hypotension 04/30/2019   Stroke ([place])    GERD (gastroesophageal reflux disease)    Protein-calorie malnutrition, severe ([place]) 04/25/2019   Acute kidney injury superimposed on CKD 3b ([place])  04/23/2019   Diarrhea 04/23/2019   Essential hypertension 05/12/2018   Acute on chronic HFrEF (heart failure with reduced ejection fraction) ([place]) 12/15/2017   NICM (nonischemic cardiomyopathy) ([place]) 05/13/2017   Type 2 diabetes mellitus with stage 3b chronic kidney disease, with long-term current use of insulin ([place]) 05/13/2017   Smoker 05/13/2017   Polysubstance abuse ([place]) 05/13/2017   Alcohol abuse 05/13/2017          Physical Exam   Triage Vital Signs: ED Triage Vitals  Enc Vitals Group     BP 08/13/21 1212 139/82     Pulse Rate 08/13/21 1212 68     Resp 08/13/21 1212 16     Temp 08/13/21 1212 98.1 F (36.7 C)     Temp Source 08/13/21 1212 Oral     SpO2 08/13/21 1212 95 %     Weight 08/13/21 1323 136 lb 0.4 oz (61.7 kg)     Height 08/13/21 1323 5\' 7"  (1.702 m)     Head Circumference --      Peak Flow --      Pain Score 08/13/21 1212 7     Pain Loc --      Pain Edu? --      Excl. in [place]? --     Most recent vital signs: Vitals:   08/13/21 1820 08/13/21 2000  BP: (!) 135/95 (!) 131/90  Pulse: 73 71  Resp: 16 19  Temp: 98.1 F (36.7 C) 98.6 F (37 C)  SpO2: 99% 98%  Physical Exam Vitals and nursing note reviewed.  Constitutional:      General: Awake and alert. No acute distress.    Appearance: Normal appearance. The patient is normal weight.  HENT:     Head: Normocephalic and atraumatic.     Mouth: Mucous membranes are moist.  Eyes:     General: PERRL. Normal EOMs        Right eye: No discharge.        Left eye: No discharge.     Conjunctiva/sclera: Conjunctivae normal.  Cardiovascular:     Rate and Rhythm: Normal rate and regular rhythm.     Pulses: Normal pulses.     Heart sounds: Normal heart sounds Pulmonary:     Effort: Pulmonary effort is normal. No respiratory distress.  Crackles in bilateral lung bases    Breath sounds: Normal breath sounds.  Abdominal:     Abdomen is soft. There is no abdominal tenderness. No rebound or guarding.   Musculoskeletal:        General: No swelling. Normal range of motion.     Cervical back: Normal range of motion and neck supple. +JVD 2+ pitting edema bilateral lower extremities to pelvis.  No calf tenderness.  Feet are warm and well-perfused Skin:    General: Skin is warm and dry.     Capillary Refill: Capillary refill takes less than 2 seconds.     Findings: No rash.  Neurological:     Mental Status: The patient is awake and alert.      ED Results / Procedures / Treatments   Labs (all labs ordered are listed, but only abnormal results are displayed) Labs Reviewed  COMPREHENSIVE METABOLIC PANEL - Abnormal; Notable for the following components:      Result Value   CO2 21 (*)    Glucose, Bld 413 (*)    BUN 39 (*)    Creatinine, Ser 2.21 (*)    Calcium 8.3 (*)    Albumin 2.8 (*)    GFR, Estimated 32 (*)    All other components within normal limits  CBC - Abnormal; Notable for the following components:   RBC 3.87 (*)    Hemoglobin 11.0 (*)    HCT 36.2 (*)    RDW 17.8 (*)    nRBC 0.5 (*)    All other components within normal limits  URINALYSIS, ROUTINE W REFLEX MICROSCOPIC - Abnormal; Notable for the following components:   Color, Urine YELLOW (*)    APPearance HAZY (*)    Glucose, UA >=500 (*)    Protein, ur >=300 (*)    All other components within normal limits  BRAIN NATRIURETIC PEPTIDE - Abnormal; Notable for the following components:   B Natriuretic Peptide 3,781.8 (*)    All other components within normal limits  TROPONIN I (HIGH SENSITIVITY) - Abnormal; Notable for the following components:   Troponin I (High Sensitivity) 36 (*)    All other components within normal limits  TROPONIN I (HIGH SENSITIVITY) - Abnormal; Notable for the following components:   Troponin I (High Sensitivity) 40 (*)    All other components within normal limits  GASTROINTESTINAL PANEL BY PCR, STOOL (REPLACES STOOL CULTURE)  C DIFFICILE QUICK SCREEN W PCR REFLEX    LIPASE, BLOOD  CK   BASIC METABOLIC PANEL  CBC  VITAMIN B12  TROPONIN I (HIGH SENSITIVITY)     EKG     RADIOLOGY I independently reviewed and interpreted imaging and agree with radiologists findings.  PROCEDURES:  Critical Care performed:   Procedures   MEDICATIONS ORDERED IN ED: Medications  acetaminophen (TYLENOL) tablet 650 mg (has no administration in time range)    Or  acetaminophen (TYLENOL) suppository 650 mg (has no administration in time range)  ondansetron (ZOFRAN) tablet 4 mg (has no administration in time range)    Or  ondansetron (ZOFRAN) injection 4 mg (has no administration in time range)  heparin injection 5,000 Units (has no administration in time range)  senna-docusate (Senokot-S) tablet 1 tablet (has no administration in time range)  labetalol (NORMODYNE) injection 5 mg (has no administration in time range)  insulin aspart (novoLOG) injection 0-15 Units (has no administration in time range)  insulin aspart (novoLOG) injection 0-5 Units (has no administration in time range)  atorvastatin (LIPITOR) tablet 40 mg (has no administration in time range)  carvedilol (COREG) tablet 3.125 mg (has no administration in time range)  nicotine (NICODERM CQ - dosed in mg/24 hours) patch 21 mg (has no administration in time range)  insulin glargine-yfgn (SEMGLEE) injection 20 Units (has no administration in time range)  multivitamin with minerals tablet 1 tablet (has no administration in time range)  cholecalciferol (VITAMIN D3) 25 MCG (1000 UNIT) tablet 2,000 Units (has no administration in time range)  feeding supplement (GLUCERNA SHAKE) (GLUCERNA SHAKE) liquid 237 mL (has no administration in time range)  aspirin chewable tablet 81 mg (has no administration in time range)  hydrALAZINE (APRESOLINE) tablet 75 mg (has no administration in time range)  isosorbide dinitrate (ISORDIL) tablet 20 mg (has no administration in time range)  insulin glargine-yfgn (SEMGLEE) injection 10  Units (has no administration in time range)  furosemide (LASIX) injection 60 mg (has no administration in time range)  iohexol (OMNIPAQUE) 300 MG/ML solution 75 mL (75 mLs Intravenous Contrast Given 08/13/21 1412)  furosemide (LASIX) injection 80 mg (80 mg Intravenous Given 08/13/21 1716)     IMPRESSION / MDM / ASSESSMENT AND PLAN / ED COURSE  I reviewed the triage vital signs and the nursing notes.   Differential diagnosis includes, but is not limited to, gastroenteritis, colitis, C. difficile, diverticulitis, anasarca, hypoalbuminemia, acute kidney injury, rhabdomyolysis, congestive heart failure, electrolyte disarray.  I reviewed the patient's chart.  Patient had a recent hospitalization from 6/20 until 7/4 for volume overload and diuresis.  He was found to have decreased EF from prior   His BUN and patient is awake and alert, hemodynamically stable Plan he has significant pitting edema in his bilateral lower extremities and into his scrotum.  Labs obtained demonstrate improved H&H and from prior, no leukocytosis.  Renal function at baseline.  He has hyperglycemia to the 400s, though normal anion gap, normal bicarb, do not suspect DKA.  CT scan demonstrates large volume ascites and diffuse anasarca, similar to previous obtained 06/23/2021.  He has no abdominal tenderness on exam, no fever, do not suspect SBP.  BNP is elevated to 3781, significantly more elevated than previous admission.  Troponin is also slightly elevated, though improved from prior.  Given that he has had decreased ability to get around, recommend admission to the hospitalist service for diuresis.  He was given 80mg  of IV Lasix.  Patient is currently pending stool cultures.  No recent antibiotics to suggest C. difficile.  Recommended admission to the hospitalist service for volume overload, necessity of diuretics, anasarca, and diarrhea.  Patient was accepted by Dr. Tobie Poet.  Patient's presentation is most consistent with  acute presentation with potential threat to life or bodily function.  Clinical Course as of 08/13/21 2054  Thu Aug 13, 2021  1649 CT Abdomen Pelvis W Contrast [FC]    Clinical Course User Index [FC] Coyer, Alen Blew     FINAL CLINICAL IMPRESSION(S) / ED DIAGNOSES   Final diagnoses:  Acute on chronic congestive heart failure, unspecified heart failure type ([place])  Diarrhea, unspecified type     Rx / DC Orders   ED Discharge Orders     None        Note:  This document was prepared using Dragon voice recognition software and may include unintentional dictation errors.   Emeline Gins 08/13/21 2054    Blake Divine, MD 08/15/21 [phone]

## 2021-08-13 NOTE — ED Notes (Signed)
Transport called to transport pt to 250, IP RN notified.

## 2021-08-13 NOTE — ED Triage Notes (Signed)
Pt to ED via POV for abdominal pain, diarrhea, and swelling for the past few weeks. Pt states that he thinks this may be due to one of his medications. Pt is currently in NAD.

## 2021-08-13 NOTE — Assessment & Plan Note (Signed)
-   Atorvastatin 40 mg nightly resumed 

## 2021-08-13 NOTE — Assessment & Plan Note (Addendum)
-   I suspect this is secondary to medication noncompliance - Patient tells me that he is taking his medication however on med reconciliation the majority of his medications were listed as taken in the last week - Status post furosemide 80 mg IV one-time dose per EDP -Holding further diuresis due to diarrhea - Continue home Coreg 3.125 mg p.o. twice daily, hydralazine 75 mg every 8 hours, isosorbide mononitrate 20 mg every 8 hours - Strict I's and O's

## 2021-08-13 NOTE — H&P (Signed)
History and Physical   Shawn [name] NWG:[id number] DOB: 1955/07/31 DOA: 08/13/2021  PCP: [facility]  Patient coming from: Home via [place]  I have personally briefly reviewed patient's old medical records in [place].  Chief Concern: Diarrhea and weakness  HPI: [name] is a 66 year old male with history of combined heart failure, insulin-dependent diabetes mellitus type 2 poorly controlled, CKD 3B, tobacco use, alcohol abuse, hypertension, severe malnutrition, history of CVA, who presents to the emergency department for chief concerns of swelling of his lower extremities.  Initial vitals in the emergency department showed temperature of 98.1, respiration rate of 16, heart rate of 68, blood pressure 139/82, SpO2 95% on room air.  Serum sodium is 136, potassium 3.5, chloride 108, bicarb 21, BUN of 39, serum creatinine of 2.21, GFR 32, nonfasting blood glucose 113, WBC 6.1, hemoglobin 11, platelets of 350.  BNP of 3781.  High sensitive troponin was 36.  CK was 96.  ED treatment: Furosemide 80 mg IV one-time dose.  At bedside patient was able to tell me his name, his age, he knows he is in the hospital.  He reports his chief concern for coming to the hospital is because of persistent diarrhea for 1 week.  He reports the diarrhea started about a week ago and he denies changes to his diet.  Specifically he had 10 episodes of diarrhea last night and he was sleeping in the bathroom.  He denies shortness of breath, chest pain, abdominal pain, dysuria, hematuria.  He denies fever, nausea, vomiting.  He reports the swelling in his lower extremity is at baseline.  He endorses gaining approximately 5-6 pounds over the last 2 weeks.  He endorses compliance with his home medications.   Social history: He lives at home with a friend.  He is a current tobacco user smoking less than half pack per day.  He denies EtOH and recreational drug use.  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  ROS: Constitutional: no weight change, no fever ENT/Mouth: no sore throat, no rhinorrhea Eyes: no eye pain, no vision changes Cardiovascular: no chest pain, no dyspnea,  + edema, no palpitations Respiratory: no cough, no sputum, no wheezing Gastrointestinal: no nausea, no vomiting, + diarrhea, no constipation Genitourinary: no urinary incontinence, no dysuria, no hematuria Musculoskeletal: no arthralgias, no myalgias Skin: no skin lesions, no pruritus, Neuro: + weakness, no loss of consciousness, no syncope Psych: no anxiety, no depression, + decrease appetite Heme/Lymph: no bruising, no bleeding  ED Course: Discussed with emergency medicine provider, patient requiring hospitalization for chief concern of weakness and diarrhea.  Assessment/Plan  Principal Problem:   Acute exacerbation of CHF (congestive heart failure) ([name]) Active Problems:   Anasarca   Type 2 diabetes mellitus with stage 3b chronic kidney disease, with long-term current use of insulin ([name])   Essential hypertension   NICM (nonischemic cardiomyopathy) ([name])   Smoker   Polysubstance abuse ([name])   Alcohol abuse   Diarrhea   Protein-calorie malnutrition, severe ([name])   GERD (gastroesophageal reflux disease)   Elevated troponin   HLD (hyperlipidemia)   Weakness   Assessment and Plan:  * Acute exacerbation of CHF (congestive heart failure) ([place]) - I suspect this is secondary to medication noncompliance - Patient tells me that he is taking his medication however on med reconciliation the majority of his medications were listed as taken in the last week - Status post furosemide 80 mg IV one-time dose per  EDP - I have resumed Coreg 3.125 mg p.o. twice daily, hydralazine 75 mg every 8 hours, isosorbide mononitrate 20 mg every 8 hours - Strict I's and O's - Admit to telemetry cardiac, observation  Type 2 diabetes mellitus with stage 3b chronic kidney disease, with  long-term current use of insulin ([name]) - Long-acting insulin glargine 20 units daily resumed - Insulin SSI with at bedtime coverage ordered - Goal inpatient blood glucose levels 140-180  Essential hypertension - Hydralazine 75 mg every 8 hours, carvedilol 3.125 mg p.o. twice daily  Weakness - Presumed secondary to diarrhea - Check B12 in the a.m. - Patient may benefit from PT/OT with possible discharge to SNF facility  HLD (hyperlipidemia) - Atorvastatin 40 mg nightly resumed  Elevated troponin - Presumed secondary to demand ischemia in setting of heart failure exacerbation - Low clinical suspicion for ACS as patient denies chest pain and EKG was negative for ischemic changes EKG is negative for new changes when compared to prior in June 2023  Protein-calorie malnutrition, severe ([place]) - Resumed feeding supplementation, Glucerna 3 times daily between meals  Diarrhea - Check GI panel and C. difficile PCR ordered and pending collection  Alcohol abuse - Patient states he does not drink alcohol any longer and states his last drink was several months ago.  Smoker - As needed nicotine patch ordered  Chart reviewed.   DVT prophylaxis: Heparin 5000 units subcutaneous every 8 hours Code Status: Full code Diet: Renal/carb Family Communication: No Disposition Plan: Pending clinical course Consults called: None at this time Admission status: Telemetry cardiac, observation  Past Medical History:  Diagnosis Date   Chronic combined systolic (congestive) and diastolic (congestive) heart failure ([place])    a. TTE 6/15: EF < 20%, mildly dilated LV, DD, mildly dilated LA, mod dilated RA, mild MR, mild to mod TR, mod increased posterior wall thickness, elevated LA and LVEDP; b. 4/12019 Echo: EF 20-25%, diff HK. Gr1 DD, nl RV fxn.   CKD (chronic kidney disease), stage II    Diabetes mellitus with complication ([place])    a. Prior admissions w/ DKA (last 12/2017).   Essential hypertension     NICM (nonischemic cardiomyopathy) ([place])    a. 06/2013 Echo: EF<20%; b. 06/2013 Cath: no signif dzs; c. 04/2017 Echo: 20-25%, gr1 DD.; d.05/2018 Echo: 25-30%   Polysubstance abuse ([place])    a. etoh and tobacco   Stroke [facility])    Past Surgical History:  Procedure Laterality Date   CENTRAL LINE INSERTION N/A 06/29/2021   Procedure: CENTRAL LINE INSERTION;  Surgeon: Nelva Bush, MD;  Location: [place];  Service: Cardiovascular;  Laterality: N/A;   NO PAST SURGERIES     RIGHT HEART CATH N/A 06/29/2021   Procedure: RIGHT HEART CATH;  Surgeon: Nelva Bush, MD;  Location: [place] [facility];  Service: Cardiovascular;  Laterality: N/A;   Social History:  reports that he has been smoking cigarettes. He has been smoking an average of .5 packs per day. He has never used smokeless tobacco. He reports that he does not currently use alcohol. He reports that he does not use drugs.  Allergies  Allergen Reactions   Pravastatin     Other reaction(s): Muscle pain   Simvastatin     Other reaction(s): Muscle pain   Family History  Problem Relation Age of Onset   Congestive Heart Failure Mother    Diabetes Mother    Congestive Heart Failure Brother    Family history: Family history reviewed and pertinent  congestive heart failure in mother and brother.  Prior to Admission medications   Medication Sig Start Date End Date Taking? Authorizing Provider  acetaminophen (TYLENOL) 325 MG tablet Take 2 tablets (650 mg total) by mouth every 6 (six) hours as needed for mild pain (or Fever >/= 101). 01/09/21   Barb Merino, MD  aspirin 81 MG chewable tablet Chew 1 tablet (81 mg total) by mouth daily. 04/30/17   Demetrios Loll, MD  atorvastatin (LIPITOR) 40 MG tablet TAKE ONE TABLET BY MOUTH AT BEDTIME FOR CHOLESTEROL 02/16/21   [provider]  carvedilol (COREG) 3.125 MG tablet Take 1 tablet (3.125 mg total) by mouth 2 (two) times daily with a meal. 07/07/21   Lorella Nimrod, MD  cholecalciferol  (VITAMIN D) 25 MCG (1000 UNIT) tablet Take 2,000 Units by mouth daily. Mon    [provider]  feeding supplement, GLUCERNA SHAKE, (GLUCERNA SHAKE) LIQD Take 237 mLs by mouth 3 (three) times daily between meals. 01/09/21   Barb Merino, MD  furosemide (LASIX) 40 MG tablet Take 1 tablet (40 mg total) by mouth 2 (two) times daily. 07/07/21   Lorella Nimrod, MD  hydrALAZINE (APRESOLINE) 25 MG tablet Take 3 tablets (75 mg total) by mouth every 8 (eight) hours. 07/07/21   Lorella Nimrod, MD  hydrOXYzine (ATARAX) 10 MG tablet Take 10 mg by mouth 4 (four) times daily - after meals and at bedtime. 02/18/21   [provider]  insulin aspart (NOVOLOG FLEXPEN) 100 UNIT/ML FlexPen Inject 6 Units into the skin 3 (three) times daily with meals. 03/25/21   [provider]  insulin glargine-yfgn (SEMGLEE) 100 UNIT/ML Pen Inject 20 Units into the skin daily. Patient taking differently: Inject 25 Units into the skin daily. 05/14/21   Enzo Bi, MD  isosorbide dinitrate (ISORDIL) 20 MG tablet Take 1 tablet (20 mg total) by mouth every 8 (eight) hours. 07/07/21   Lorella Nimrod, MD  Multiple Vitamin (MULTIVITAMIN WITH MINERALS) TABS tablet Take 1 tablet by mouth daily. 01/10/21   Barb Merino, MD  nicotine (NICODERM CQ - DOSED IN MG/24 HOURS) 21 mg/24hr patch Place 1 patch (21 mg total) onto the skin daily. 07/07/21   Lorella Nimrod, MD   Physical Exam: Vitals:   08/13/21 1323 08/13/21 1614 08/13/21 1715 08/13/21 1820  BP:  (!) 138/95 (!) 133/90 (!) 135/95  Pulse:  70 69 73  Resp:  16 18 16   Temp:  97.7 F (36.5 C)  98.1 F (36.7 C)  TempSrc:    Oral  SpO2:  100% 98% 99%  Weight: 61.7 kg     Height: 5\' 7"  (1.702 m)      Constitutional: appears age-appropriate, frail, chronically ill, NAD, calm, comfortable Eyes: PERRL, lids and conjunctivae normal ENMT: Mucous membranes are moist. Posterior pharynx clear of any exudate or lesions. Age-appropriate dentition. Hearing appropriate Neck: normal,  supple, no masses, no thyromegaly Respiratory: clear to auscultation bilaterally, no wheezing, no crackles. Normal respiratory effort. No accessory muscle use.  Cardiovascular: Regular rate and rhythm, no murmurs / rubs / gallops.  Bilateral lower extremity edema. 2+ pedal pulses. No carotid bruits.  Abdomen: no tenderness, no masses palpated, no hepatosplenomegaly. Bowel sounds positive.  Musculoskeletal: no clubbing / cyanosis. No joint deformity upper and lower extremities. Good ROM, no contractures, no atrophy. Normal muscle tone.  Skin: no rashes, lesions, ulcers. No induration Neurologic: Sensation intact. Strength 5/5 in all 4.  Psychiatric: Normal judgment and insight. Alert and oriented x 3. Normal mood.   EKG:  independently reviewed, showing this rhythm with rate of 67, QTc 479  Chest x-ray on Admission: I personally reviewed and I agree with radiologist reading as below.  DG Chest 2 View  Result Date: 08/13/2021 CLINICAL DATA:  Volume overload, abdominal pain, swelling EXAM: CHEST - 2 VIEW COMPARISON:  06/29/2021 FINDINGS: Frontal and lateral views of the chest demonstrates stable enlargement of the cardiac silhouette. Since the prior exam, there has been significant improvement in volume status, with residual central vascular congestion and interstitial edema. There are small bilateral pleural effusions obscuring the posterior costophrenic angles. No pneumothorax. IMPRESSION: 1. Improving volume status since prior study, with residual interstitial edema and small bilateral effusions as above. Electronically Signed   By: Randa Ngo M.D.   On: 08/13/2021 15:22   CT Abdomen Pelvis W Contrast  Result Date: 08/13/2021 CLINICAL DATA:  Abdominal pain, acute, nonlocalized diarrhea, abd distention EXAM: CT ABDOMEN AND PELVIS WITH CONTRAST TECHNIQUE: Multidetector CT imaging of the abdomen and pelvis was performed using the standard protocol following bolus administration of intravenous  contrast. RADIATION DOSE REDUCTION: This exam was performed according to the departmental dose-optimization program which includes automated exposure control, adjustment of the mA and/or kV according to patient size and/or use of iterative reconstruction technique. CONTRAST:  102mL OMNIPAQUE IOHEXOL 300 MG/ML  SOLN COMPARISON:  CT AP, most recently 06/23/2021 and 01/12/2019. Chest XR, 06/29/2021. FINDINGS: Lower chest: Small volume bilateral pleural effusions with adjacent dependent atelectasis. Cardiomegaly. Hepatobiliary: No focal liver abnormality is seen. No gallstones, gallbladder wall thickening, or biliary dilatation. Pancreas: No pancreatic ductal dilatation or surrounding inflammatory changes. Spleen: Normal in size without focal abnormality. Adrenals/Urinary Tract: Adrenal glands are unremarkable. Kidneys are normal, without renal calculi, focal lesion, or hydronephrosis. Bladder is unremarkable. Stomach/Bowel: Stomach is within normal limits. Appendix is not definitively visualized. No evidence of bowel wall thickening, distention, or inflammatory changes. Vascular/Lymphatic: Aortic atherosclerosis without aneurysmal dilation. No enlarged abdominal or pelvic lymph nodes. Reproductive: Prostate is unremarkable. Other: Anasarca. Large volume intra-abdominal ascites. LEFT ascitic inguinal hernia. Penile and scrotal edema. Musculoskeletal: No acute osseous findings. IMPRESSION: 1. Anasarca and additional findings of fluid overload, including; large volume intra-abdominal ascites, ascitic inguinal hernias, scrotal and body wall edema. 2. Cardiomegaly and small volume bilateral pleural effusions. Electronically Signed   By: Michaelle Birks M.D.   On: 08/13/2021 14:41    Labs on Admission: I have personally reviewed following labs  CBC: Recent Labs  Lab 08/13/21 1225  WBC 4.1  HGB 11.0*  HCT 36.2*  MCV 93.5  PLT 709   Basic Metabolic Panel: Recent Labs  Lab 08/13/21 1225  NA 136  K 3.5  CL 108   CO2 21*  GLUCOSE 413*  BUN 39*  CREATININE 2.21*  CALCIUM 8.3*   GFR: Estimated Creatinine Clearance: 29.1 mL/min (A) (by C-G formula based on SCr of 2.21 mg/dL (H)).  Liver Function Tests: Recent Labs  Lab 08/13/21 1225  AST 18  ALT 10  ALKPHOS 113  BILITOT 0.6  PROT 6.5  ALBUMIN 2.8*   Recent Labs  Lab 08/13/21 1225  LIPASE 25   Cardiac Enzymes: Recent Labs  Lab 08/13/21 1225  CKTOTAL 96   Urine analysis:    Component Value Date/Time   COLORURINE YELLOW (A) 08/13/2021 1350   APPEARANCEUR HAZY (A) 08/13/2021 1350   LABSPEC 1.016 08/13/2021 1350   PHURINE 5.0 08/13/2021 1350   GLUCOSEU >=500 (A) 08/13/2021 1350   HGBUR NEGATIVE 08/13/2021 [place] 08/13/2021 1350  KETONESUR NEGATIVE 08/13/2021 1350   PROTEINUR >=300 (A) 08/13/2021 1350   NITRITE NEGATIVE 08/13/2021 1350   LEUKOCYTESUR NEGATIVE 08/13/2021 1350   Dr. Tobie Poet [facility]  If 7PM-7AM, please contact overnight-coverage provider If 7AM-7PM, please contact day coverage provider [email or web address]  08/13/2021, 7:22 PM

## 2021-08-13 NOTE — Assessment & Plan Note (Addendum)
Estimated body mass index is 21.3 kg/m as calculated from the following:   Height as of this encounter: 5\' 7"  (1.702 m).   Weight as of this encounter: 61.7 kg.   - Resumed feeding supplementation, Glucerna 3 times daily between meals

## 2021-08-13 NOTE — Hospital Course (Addendum)
[name] is a 66 year old male with history of combined heart failure, insulin-dependent diabetes mellitus type 2 poorly controlled, CKD 3B, tobacco use, alcohol abuse, hypertension, severe malnutrition, history of CVA, who presents to the emergency department for chief concerns of diarrhea and swelling of his lower extremities.  Patient was experiencing worsening diarrhea for the past 1 week, had 10 episodes of watery diarrhea last night.He denies shortness of breath, chest pain, abdominal pain, dysuria, hematuria.  He denies fever, nausea, vomiting.  Initial vitals in the emergency department showed temperature of 98.1, respiration rate of 16, heart rate of 68, blood pressure 139/82, SpO2 95% on room air.  Serum sodium is 136, potassium 3.5, chloride 108, bicarb 21, BUN of 39, serum creatinine of 2.21, GFR 32, nonfasting blood glucose 113, WBC 6.1, hemoglobin 11, platelets of 350.  BNP of 3781.  High sensitive troponin was 36.  CK was 96.  ED treatment: Furosemide 80 mg IV one-time dose.  8/11: C. difficile PCR came back positive.  No leukocytosis.  Starting him on fidaxomicin for 10 days.  Renal function seems stable on IV Lasix.  Potassium at 3.2 which is being repleted. Patient denies any recent antibiotic use.  Sister was sick with similar symptoms, he was not aware of any diagnosis of C. difficile on her.  8/12: Diarrhea resolved.  No more abdominal pain.  Labs seems stable. Patient is being discharged on fidaxomicin for total of 10 days to treat his C. difficile colitis.  He will continue the rest of his home medications and will follow-up with his providers.

## 2021-08-13 NOTE — Assessment & Plan Note (Signed)
-   Patient states he does not drink alcohol any longer and states his last drink was several months ago.

## 2021-08-13 NOTE — Assessment & Plan Note (Addendum)
-   Presumed secondary to diarrhea.  B12 within normal limit -PT/OT evaluation

## 2021-08-13 NOTE — Progress Notes (Signed)
[facility]) Hospital Liaison note:  This is a pending outpatient-based Palliative Care patient. Will continue to follow for disposition.  Please call with any outpatient palliative questions or concerns.  Thank you, Lorelee Market, LPN [facility] Liaison [phone]

## 2021-08-13 NOTE — Assessment & Plan Note (Addendum)
-   Presumed secondary to demand ischemia in setting of heart failure exacerbation, barely positive with a flat curve - Low clinical suspicion for ACS as patient denies chest pain and EKG was negative for ischemic changes EKG is negative for new changes when compared to prior in June 2023

## 2021-08-14 ENCOUNTER — Other Ambulatory Visit (HOSPITAL_COMMUNITY): Payer: Self-pay

## 2021-08-14 ENCOUNTER — Telehealth (HOSPITAL_COMMUNITY): Payer: Self-pay | Admitting: Pharmacy Technician

## 2021-08-14 ENCOUNTER — Other Ambulatory Visit: Payer: Self-pay

## 2021-08-14 DIAGNOSIS — A0472 Enterocolitis due to Clostridium difficile, not specified as recurrent: Secondary | ICD-10-CM | POA: Diagnosis present

## 2021-08-14 DIAGNOSIS — N1832 Chronic kidney disease, stage 3b: Secondary | ICD-10-CM | POA: Diagnosis present

## 2021-08-14 DIAGNOSIS — F17210 Nicotine dependence, cigarettes, uncomplicated: Secondary | ICD-10-CM | POA: Diagnosis present

## 2021-08-14 DIAGNOSIS — F1010 Alcohol abuse, uncomplicated: Secondary | ICD-10-CM

## 2021-08-14 DIAGNOSIS — E785 Hyperlipidemia, unspecified: Secondary | ICD-10-CM | POA: Diagnosis present

## 2021-08-14 DIAGNOSIS — R188 Other ascites: Secondary | ICD-10-CM | POA: Diagnosis present

## 2021-08-14 DIAGNOSIS — Z79899 Other long term (current) drug therapy: Secondary | ICD-10-CM | POA: Diagnosis not present

## 2021-08-14 DIAGNOSIS — Z8249 Family history of ischemic heart disease and other diseases of the circulatory system: Secondary | ICD-10-CM | POA: Diagnosis not present

## 2021-08-14 DIAGNOSIS — N189 Chronic kidney disease, unspecified: Secondary | ICD-10-CM | POA: Diagnosis not present

## 2021-08-14 DIAGNOSIS — I130 Hypertensive heart and chronic kidney disease with heart failure and stage 1 through stage 4 chronic kidney disease, or unspecified chronic kidney disease: Secondary | ICD-10-CM | POA: Diagnosis present

## 2021-08-14 DIAGNOSIS — Z7982 Long term (current) use of aspirin: Secondary | ICD-10-CM | POA: Diagnosis not present

## 2021-08-14 DIAGNOSIS — E1122 Type 2 diabetes mellitus with diabetic chronic kidney disease: Secondary | ICD-10-CM | POA: Diagnosis present

## 2021-08-14 DIAGNOSIS — Z888 Allergy status to other drugs, medicaments and biological substances status: Secondary | ICD-10-CM | POA: Diagnosis not present

## 2021-08-14 DIAGNOSIS — R778 Other specified abnormalities of plasma proteins: Secondary | ICD-10-CM | POA: Diagnosis present

## 2021-08-14 DIAGNOSIS — Z794 Long term (current) use of insulin: Secondary | ICD-10-CM | POA: Diagnosis not present

## 2021-08-14 DIAGNOSIS — I248 Other forms of acute ischemic heart disease: Secondary | ICD-10-CM | POA: Diagnosis present

## 2021-08-14 DIAGNOSIS — I428 Other cardiomyopathies: Secondary | ICD-10-CM | POA: Diagnosis present

## 2021-08-14 DIAGNOSIS — E11649 Type 2 diabetes mellitus with hypoglycemia without coma: Secondary | ICD-10-CM | POA: Diagnosis not present

## 2021-08-14 DIAGNOSIS — N179 Acute kidney failure, unspecified: Secondary | ICD-10-CM | POA: Diagnosis present

## 2021-08-14 DIAGNOSIS — Z8673 Personal history of transient ischemic attack (TIA), and cerebral infarction without residual deficits: Secondary | ICD-10-CM | POA: Diagnosis not present

## 2021-08-14 DIAGNOSIS — E43 Unspecified severe protein-calorie malnutrition: Secondary | ICD-10-CM | POA: Diagnosis present

## 2021-08-14 DIAGNOSIS — Z833 Family history of diabetes mellitus: Secondary | ICD-10-CM | POA: Diagnosis not present

## 2021-08-14 DIAGNOSIS — I5043 Acute on chronic combined systolic (congestive) and diastolic (congestive) heart failure: Secondary | ICD-10-CM | POA: Diagnosis present

## 2021-08-14 DIAGNOSIS — E1165 Type 2 diabetes mellitus with hyperglycemia: Secondary | ICD-10-CM | POA: Diagnosis present

## 2021-08-14 DIAGNOSIS — K219 Gastro-esophageal reflux disease without esophagitis: Secondary | ICD-10-CM | POA: Diagnosis present

## 2021-08-14 LAB — CBC
HCT: 31.8 % — ABNORMAL LOW (ref 39.0–52.0)
Hemoglobin: 9.8 g/dL — ABNORMAL LOW (ref 13.0–17.0)
MCH: 28.3 pg (ref 26.0–34.0)
MCHC: 30.8 g/dL (ref 30.0–36.0)
MCV: 91.9 fL (ref 80.0–100.0)
Platelets: 321 10*3/uL (ref 150–400)
RBC: 3.46 MIL/uL — ABNORMAL LOW (ref 4.22–5.81)
RDW: 17.4 % — ABNORMAL HIGH (ref 11.5–15.5)
WBC: 3.8 10*3/uL — ABNORMAL LOW (ref 4.0–10.5)
nRBC: 0 % (ref 0.0–0.2)

## 2021-08-14 LAB — BASIC METABOLIC PANEL
Anion gap: 5 (ref 5–15)
BUN: 37 mg/dL — ABNORMAL HIGH (ref 8–23)
CO2: 25 mmol/L (ref 22–32)
Calcium: 8.5 mg/dL — ABNORMAL LOW (ref 8.9–10.3)
Chloride: 111 mmol/L (ref 98–111)
Creatinine, Ser: 2.04 mg/dL — ABNORMAL HIGH (ref 0.61–1.24)
GFR, Estimated: 36 mL/min — ABNORMAL LOW (ref >60)
Glucose, Bld: 137 mg/dL — ABNORMAL HIGH (ref 70–99)
Potassium: 3.2 mmol/L — ABNORMAL LOW (ref 3.5–5.1)
Sodium: 141 mmol/L (ref 135–145)

## 2021-08-14 LAB — GLUCOSE, CAPILLARY
Glucose-Capillary: 192 mg/dL — ABNORMAL HIGH (ref 70–99)
Glucose-Capillary: 137 mg/dL — ABNORMAL HIGH (ref 70–99)
Glucose-Capillary: 56 mg/dL — ABNORMAL LOW (ref 70–99)
Glucose-Capillary: 114 mg/dL — ABNORMAL HIGH (ref 70–99)
Glucose-Capillary: 101 mg/dL — ABNORMAL HIGH (ref 70–99)

## 2021-08-14 LAB — MAGNESIUM: Magnesium: 2.2 mg/dL (ref 1.7–2.4)

## 2021-08-14 LAB — VITAMIN B12: Vitamin B-12: 842 pg/mL (ref 180–914)

## 2021-08-14 MED ORDER — FIDAXOMICIN 200 MG PO TABS
200.0000 mg | ORAL_TABLET | Freq: Two times a day (BID) | ORAL | Status: DC
  Administered 2021-08-14 – 2021-08-15 (×3): 200 mg via ORAL
  Filled 2021-08-14 (×3): qty 1

## 2021-08-14 MED ORDER — DEXTROSE 50 % IV SOLN: 1.0000 | Freq: Once | INTRAVENOUS | Status: DC

## 2021-08-14 MED ORDER — POTASSIUM CHLORIDE CRYS ER 20 MEQ PO TBCR
40.0000 meq | EXTENDED_RELEASE_TABLET | Freq: Once | ORAL | Status: AC
  Administered 2021-08-14: 40 meq via ORAL
  Filled 2021-08-14: qty 2

## 2021-08-14 MED ORDER — DEXTROSE 50 % IV SOLN
1.0000 | Freq: Once | INTRAVENOUS | Status: AC
  Administered 2021-08-14: 50 mL via INTRAVENOUS

## 2021-08-14 MED ORDER — FIDAXOMICIN 200 MG PO TABS
200.0000 mg | ORAL_TABLET | Freq: Two times a day (BID) | ORAL | 0 refills | Status: DC
  Filled 2021-08-14: qty 18, 9d supply, fill #0

## 2021-08-14 MED ORDER — DEXTROSE 50 % IV SOLN
INTRAVENOUS | Status: AC
  Filled 2021-08-14: qty 50

## 2021-08-14 NOTE — TOC Benefit Eligibility Note (Signed)
Patient Teacher, English as a foreign language completed.    The patient is currently admitted and upon discharge could be taking Dificid 200 mg tablets.  The current 10 day co-pay is $0.00.   The patient is insured through [place], [place] Patient Advocate Specialist [place] Patient Advocate Team Direct Number: [phone]  Fax: [phone]

## 2021-08-14 NOTE — Consult Note (Signed)
   Heart Failure Nurse Navigator Note  HFrEF 5 to 30%.  Left ventricular internal cavity is mildly dilated.  Grade 2 diastolic dysfunction.  Global longitudinal strain is abnormal.  He presented to the emergency room with complaints of diarrhea and weakness.  Comorbidities:  Type 2 diabetes Stage III kidney disease Polysubstance abuse Alcohol abuse Hypertension  Medications:  Aspirin 81 mg Atorvastatin 40 mg at bedtime Carvedilol 3.125 mg 2 times a day with meals Hydralazine 75 mg every 8 hours Isosorbide dinitrate 20 mg every 8 hours NicoDerm patch  Labs:  Sodium 141, potassium 3.2, chloride 111, CO2 25, BUN 37, creatinine 2.04 estimated GFR 36, magnesium 2.2 Weight is 61.7 kg Blood pressure 135/92  Initial meeting with patient who is lying in bed in no acute distress.  States at home that he had been plagued with diarrhea, which he attributed it to his medications.  He states that he had been weighing himself daily and weights and ranged from 148 to 150 pounds.  He felt that he was maintaining his fluid restriction of 32 ounces.  By teach back method went over weight gain, signs and symptoms to report.  He needed very little reinforcement.  Discussed why he had not shown for his appointment in the outpatient heart failure clinic on July 13, patient felt that he had a conflicting appointment at the [facility] that day.  Made aware that he has an appointment on August 31 at 10 AM in the outpatient heart failure clinic.  He is 11% no-show which is 6 out of 57 appointments.  No further questions at this time.

## 2021-08-14 NOTE — Telephone Encounter (Signed)
Pharmacy Patient Advocate Encounter  Insurance verification completed.    The patient is insured through [employer] Medicare Part D   The patient is currently admitted and ran test claims for the following: Dificid.  Copays and coinsurance results were relayed to Inpatient clinical team.      

## 2021-08-14 NOTE — Progress Notes (Signed)
Progress Note   Patient: Shawn [name] [id number]:[id number] DOB: 04-20-1955 DOA: 08/13/2021     0 DOS: the patient was seen and examined on 08/14/2021   Brief hospital course: Mr. Grant Swager is a 66 year old male with history of combined heart failure, insulin-dependent diabetes mellitus type 2 poorly controlled, CKD 3B, tobacco use, alcohol abuse, hypertension, severe malnutrition, history of CVA, who presents to the emergency department for chief concerns of diarrhea and swelling of his lower extremities.  Patient was experiencing worsening diarrhea for the past 1 week, had 10 episodes of watery diarrhea last night.He denies shortness of breath, chest pain, abdominal pain, dysuria, hematuria.  He denies fever, nausea, vomiting.  Initial vitals in the emergency department showed temperature of 98.1, respiration rate of 16, heart rate of 68, blood pressure 139/82, SpO2 95% on room air.  Serum sodium is 136, potassium 3.5, chloride 108, bicarb 21, BUN of 39, serum creatinine of 2.21, GFR 32, nonfasting blood glucose 113, WBC 6.1, hemoglobin 11, platelets of 350.  BNP of 3781.  High sensitive troponin was 36.  CK was 96.  ED treatment: Furosemide 80 mg IV one-time dose.  8/11: C. difficile PCR came back positive.  No leukocytosis.  Starting him on fidaxomicin for 10 days.  Renal function seems stable on IV Lasix.  Potassium at 3.2 which is being repleted. Patient denies any recent antibiotic use.  Sister was sick with similar symptoms, he was not aware of any diagnosis of C. difficile on her.   Assessment and Plan: * Acute exacerbation of CHF (congestive heart failure) ([place]) - I suspect this is secondary to medication noncompliance - Patient tells me that he is taking his medication however on med reconciliation the majority of his medications were listed as taken in the last week - Status post furosemide 80 mg IV one-time dose per EDP -Holding further diuresis due to diarrhea -  Continue home Coreg 3.125 mg p.o. twice daily, hydralazine 75 mg every 8 hours, isosorbide mononitrate 20 mg every 8 hours - Strict I's and O's   C. difficile colitis C. difficile PCR came back positive.  Denies any recent antibiotic use but sister was sick with similar symptoms. Continue to have abdominal pain and diarrhea, no leukocytosis. -Start him on fidaxomicin for 10 days -Encourage p.o. hydration  Acute kidney injury superimposed on CKD 3b ([place]) Creatinine improving and now appears to be at baseline. -Monitor renal function -Avoid nephrotoxins  Type 2 diabetes mellitus with stage 3b chronic kidney disease, with long-term current use of insulin ([name]) Had an episode of hypoglycemia this morning requiring D50. - Long-acting insulin glargine 20 units daily -holding for today due to hypoglycemia. - Insulin SSI with at bedtime coverage ordered - Goal inpatient blood glucose levels 140-180  Essential hypertension Blood pressure within goal - Continue hydralazine 75 mg every 8 hours,  -Continue carvedilol 3.125 mg p.o. twice daily  Weakness - Presumed secondary to diarrhea.  B12 within normal limit -PT/OT evaluation  Protein-calorie malnutrition, severe ([name]) Estimated body mass index is 21.3 kg/m as calculated from the following:   Height as of this encounter: 5\' 7"  (1.702 m).   Weight as of this encounter: 61.7 kg.   - Resumed feeding supplementation, Glucerna 3 times daily between meals  Elevated troponin - Presumed secondary to demand ischemia in setting of heart failure exacerbation, barely positive with a flat curve - Low clinical suspicion for ACS as patient denies chest pain and EKG was negative for ischemic changes EKG is  negative for new changes when compared to prior in June 2023  Smoker - As needed nicotine patch ordered  Alcohol abuse - Patient states he does not drink alcohol any longer and states his last drink was several months ago.  HLD  (hyperlipidemia) - Atorvastatin 40 mg nightly resumed   Subjective: Patient continued to have some abdominal pain and watery diarrhea. He refused Flexi-Seal.  No nausea or vomiting.  Physical Exam: Vitals:   08/14/21 0027 08/14/21 0316 08/14/21 0824 08/14/21 1132  BP: 134/84 (!) 125/90 123/84 (!) 135/92  Pulse: 69 75 67 63  Resp: 18 18 20 14   Temp: 98.3 F (36.8 C) 98.4 F (36.9 C) 98.2 F (36.8 C)   TempSrc:      SpO2: 94% 93% 96% 96%  Weight:      Height:       General.  Well-developed elderly man, in no acute distress. Pulmonary.  Lungs clear bilaterally, normal respiratory effort. CV.  Regular rate and rhythm, no JVD, rub or murmur. Abdomen.  Soft, mild diffuse tenderness, nondistended, BS positive. CNS.  Alert and oriented .  No focal neurologic deficit. Extremities.  No edema, no cyanosis, pulses intact and symmetrical. Psychiatry.  Judgment and insight appears normal.  Data Reviewed: Prior data reviewed  Family Communication: Discussed with sister on phone.  Disposition: Status is: Inpatient Remains inpatient appropriate because: Severity of illness   Planned Discharge Destination: Home  DVT prophylaxis.  Subcu heparin Time spent: 50 minutes  This record has been created using Systems analyst. Errors have been sought and corrected,but may not always be located. Such creation errors do not reflect on the standard of care.  Author: Lorella Nimrod, MD 08/14/2021 2:02 PM  For on call review [email or web address].

## 2021-08-14 NOTE — Progress Notes (Signed)
Pharmacy  - Brief Note  Fidaxomicin copay $0 but his normal pharmacy does not have it in stock.  Since possible discharge this weekend, prescription was sent to [facility] outpatient pharmacy and brought to main pharmacy.  It will be stored in main pharmacy until discharge.  Patient is aware medication was filled and will be givne to him at discharge.  Reviewed how to take medication and to seek medical attention if diarrhea worsens upon completion of antibiotic.  Side effects should be minimal since it is not systemically absorbed.   Doreene Eland, PharmD, BCPS, BCIDP Work Cell: [phone] 08/14/2021 3:55 PM

## 2021-08-14 NOTE — Assessment & Plan Note (Signed)
Creatinine improving and now appears to be at baseline. -Monitor renal function -Avoid nephrotoxins

## 2021-08-14 NOTE — Evaluation (Signed)
Physical Therapy Evaluation Patient Details Name: Shawn [name] MRN: [id number] DOB: [birth year] Today's Date: 08/14/2021  History of Present Illness  Mr. Hero Kulish is a 66 year old male with history of combined heart failure, insulin-dependent diabetes mellitus type 2 poorly controlled, CKD 3B, tobacco use, alcohol abuse, hypertension, severe malnutrition, history of CVA, who presents to the emergency department for chief concerns of swelling of his lower extremities.   Clinical Impression  Pt admitted with above diagnosis. Pt received supine in bed agreeable to PT with encouragement. Reports not wanting to participate due to frequent diarrhea spells still but agreeable for room mobility. Pt reports mod-I with SPC at home with mobility, has family support frequently.   To date, pt mod-I with bed mobility and transfers ambulating in room with [facility] with flexed torso, step to pattern with use of LUE on objects in room for steadying. Reliant on minguard for safety. Pt returning to sitting EOB with PT providing lunch tray in front of pt. Educated on use of RW for safety and energy conservation and benefits of HH PT. Pt declining HH PT but verbalizes understanding to education.  All needs in reach. Pt currently with functional limitations due to the deficits listed below (see PT Problem List). Pt will benefit from skilled PT to increase their independence and safety with mobility to allow discharge to the venue listed below.          Recommendations for follow up therapy are one component of a multi-disciplinary discharge planning process, led by the attending physician.  Recommendations may be updated based on patient status, additional functional criteria and insurance authorization.  Follow Up Recommendations Home health PT      Assistance Recommended at Discharge Intermittent Supervision/Assistance  Patient can return home with the following  A little help with walking and/or  transfers;A little help with bathing/dressing/bathroom;Assist for transportation;Help with stairs or ramp for entrance    Equipment Recommendations None recommended by PT  Recommendations for Other Services       Functional Status Assessment Patient has had a recent decline in their functional status and demonstrates the ability to make significant improvements in function in a reasonable and predictable amount of time.     Precautions / Restrictions Precautions Precautions: Fall Restrictions Weight Bearing Restrictions: No      Mobility  Bed Mobility Overal bed mobility: Modified Independent             General bed mobility comments: bed features Patient Response: Cooperative  Transfers Overall transfer level: Modified independent Equipment used: Straight cane               General transfer comment: bed in lowered position    Ambulation/Gait Ambulation/Gait assistance: Min guard Gait Distance (Feet): 35 Feet Assistive device: Straight cane Gait Pattern/deviations: Step-to pattern, Trunk flexed       General Gait Details: Pt with anterior trunk lean, reliant with RUE using SPC and LUE on objects in room. Reports being furniture walker at home  Stairs            Wheelchair Mobility    Modified Rankin (Stroke Patients Only)       Balance Overall balance assessment: Needs assistance Sitting-balance support: Feet supported, No upper extremity supported Sitting balance-Leahy Scale: Normal     Standing balance support: Single extremity supported Standing balance-Leahy Scale: Fair Standing balance comment: use of SPC  Pertinent Vitals/Pain      Home Living Family/patient expects to be discharged to:: Private residence Living Arrangements: Other relatives Available Help at Discharge: Family;Available PRN/intermittently Type of Home: House Home Access: Stairs to enter Entrance Stairs-Rails: Can reach  both Entrance Stairs-Number of Steps: 2   Home Layout: One level Home Equipment: Cane - single point;Rollator (4 wheels);Grab bars - tub/shower      Prior Function Prior Level of Function : Independent/Modified Independent             Mobility Comments: uses SPC or walker as needed       Hand Dominance        Extremity/Trunk Assessment   Upper Extremity Assessment Upper Extremity Assessment: Defer to OT evaluation    Lower Extremity Assessment Lower Extremity Assessment: Generalized weakness    Cervical / Trunk Assessment Cervical / Trunk Assessment: Normal  Communication   Communication: No difficulties  Cognition Arousal/Alertness: Awake/alert Behavior During Therapy: WFL for tasks assessed/performed Overall Cognitive Status: Within Functional Limits for tasks assessed                                 General Comments: Requires encouragement to participate        General Comments      Exercises Other Exercises Other Exercises: Role of PT in acute setting, d/c recs, energy conservation with use of RW for safety and for weakness from diarrhea.   Assessment/Plan    PT Assessment Patient needs continued PT services  PT Problem List Decreased strength;Decreased mobility;Decreased safety awareness;Decreased activity tolerance;Decreased balance       PT Treatment Interventions DME instruction;Therapeutic exercise;Gait training;Balance training;Stair training;Neuromuscular re-education;Functional mobility training;Therapeutic activities;Patient/family education    PT Goals (Current goals can be found in the Care Plan section)  Acute Rehab PT Goals Patient Stated Goal: improve mobility and diarrhea PT Goal Formulation: With patient Time For Goal Achievement: 08/28/21 Potential to Achieve Goals: Good    Frequency Min 2X/week     Co-evaluation               AM-PAC PT "6 Clicks" Mobility  Outcome Measure Help needed turning from  your back to your side while in a flat bed without using bedrails?: None Help needed moving from lying on your back to sitting on the side of a flat bed without using bedrails?: A Little Help needed moving to and from a bed to a chair (including a wheelchair)?: A Little Help needed standing up from a chair using your arms (e.g., wheelchair or bedside chair)?: A Little Help needed to walk in hospital room?: A Lot Help needed climbing 3-5 steps with a railing? : A Lot 6 Click Score: 17    End of Session Equipment Utilized During Treatment: Gait belt Activity Tolerance: Patient tolerated treatment well Patient left: in bed;with call bell/phone within reach;with bed alarm set (sitting EOB) Nurse Communication: Mobility status PT Visit Diagnosis: Unsteadiness on feet (R26.81);Other abnormalities of gait and mobility (R26.89);Muscle weakness (generalized) (M62.81)    Time: 6270-3500 PT Time Calculation (min) (ACUTE ONLY): 16 min   Charges:   PT Evaluation $PT Eval Moderate Complexity: Wenonah M. Fairly IV, PT, DPT Physical Therapist- [facility]  08/14/2021, 3:30 PM

## 2021-08-15 DIAGNOSIS — A0472 Enterocolitis due to Clostridium difficile, not specified as recurrent: Secondary | ICD-10-CM

## 2021-08-15 DIAGNOSIS — I5043 Acute on chronic combined systolic (congestive) and diastolic (congestive) heart failure: Secondary | ICD-10-CM | POA: Diagnosis not present

## 2021-08-15 DIAGNOSIS — F1010 Alcohol abuse, uncomplicated: Secondary | ICD-10-CM | POA: Diagnosis not present

## 2021-08-15 LAB — BASIC METABOLIC PANEL
Anion gap: 6 (ref 5–15)
BUN: 37 mg/dL — ABNORMAL HIGH (ref 8–23)
CO2: 22 mmol/L (ref 22–32)
Calcium: 8.5 mg/dL — ABNORMAL LOW (ref 8.9–10.3)
Chloride: 111 mmol/L (ref 98–111)
Creatinine, Ser: 2.22 mg/dL — ABNORMAL HIGH (ref 0.61–1.24)
GFR, Estimated: 32 mL/min — ABNORMAL LOW (ref >60)
Glucose, Bld: 131 mg/dL — ABNORMAL HIGH (ref 70–99)
Potassium: 3.5 mmol/L (ref 3.5–5.1)
Sodium: 139 mmol/L (ref 135–145)

## 2021-08-15 LAB — GLUCOSE, CAPILLARY
Glucose-Capillary: 100 mg/dL — ABNORMAL HIGH (ref 70–99)
Glucose-Capillary: 134 mg/dL — ABNORMAL HIGH (ref 70–99)

## 2021-08-15 LAB — CBC
HCT: 34.7 % — ABNORMAL LOW (ref 39.0–52.0)
Hemoglobin: 10.9 g/dL — ABNORMAL LOW (ref 13.0–17.0)
MCH: 28.8 pg (ref 26.0–34.0)
MCHC: 31.4 g/dL (ref 30.0–36.0)
MCV: 91.8 fL (ref 80.0–100.0)
Platelets: 338 10*3/uL (ref 150–400)
RBC: 3.78 MIL/uL — ABNORMAL LOW (ref 4.22–5.81)
RDW: 17.6 % — ABNORMAL HIGH (ref 11.5–15.5)
WBC: 3.3 10*3/uL — ABNORMAL LOW (ref 4.0–10.5)
nRBC: 0 % (ref 0.0–0.2)

## 2021-08-15 NOTE — Discharge Summary (Signed)
Physician Discharge Summary   Patient: Shawn [name] MRN: [id number] DOB: [birth year]  Admit date:     08/13/2021  Discharge date: 08/15/21  Discharge Physician: Lorella Nimrod   PCP: [facility] [place]   Recommendations at discharge:  Please obtain CBC and BMP in 1 week Please ensure that he completed the course of fidaxomicin for C. difficile colitis Follow-up with primary care provider within a week Follow up with cardiology  Discharge Diagnoses: Principal Problem:   Acute exacerbation of CHF (congestive heart failure) ([place]) Active Problems:   C. difficile colitis   Type 2 diabetes mellitus with stage 3b chronic kidney disease, with long-term current use of insulin ([place])   Acute kidney injury superimposed on CKD 3b ([place])   Essential hypertension   Weakness   Protein-calorie malnutrition, severe ([place])   Elevated troponin   Smoker   Alcohol abuse   HLD (hyperlipidemia)  Resolved Problems:   Anasarca   GERD (gastroesophageal reflux disease)   Polysubstance abuse [facility])  Hospital Course: [name] is a 66 year old male with history of combined heart failure, insulin-dependent diabetes mellitus type 2 poorly controlled, CKD 3B, tobacco use, alcohol abuse, hypertension, severe malnutrition, history of CVA, who presents to the emergency department for chief concerns of diarrhea and swelling of his lower extremities.  Patient was experiencing worsening diarrhea for the past 1 week, had 10 episodes of watery diarrhea last night.He denies shortness of breath, chest pain, abdominal pain, dysuria, hematuria.  He denies fever, nausea, vomiting.  Initial vitals in the emergency department showed temperature of 98.1, respiration rate of 16, heart rate of 68, blood pressure 139/82, SpO2 95% on room air.  Serum sodium is 136, potassium 3.5, chloride 108, bicarb 21, BUN of 39, serum creatinine of 2.21, GFR 32, nonfasting blood glucose 113, WBC 6.1, hemoglobin 11,  platelets of 350.  BNP of 3781.  High sensitive troponin was 36.  CK was 96.  ED treatment: Furosemide 80 mg IV one-time dose.  8/11: C. difficile PCR came back positive.  No leukocytosis.  Starting him on fidaxomicin for 10 days.  Renal function seems stable on IV Lasix.  Potassium at 3.2 which is being repleted. Patient denies any recent antibiotic use.  Sister was sick with similar symptoms, he was not aware of any diagnosis of C. difficile on her.  8/12: Diarrhea resolved.  No more abdominal pain.  Labs seems stable. Patient is being discharged on fidaxomicin for total of 10 days to treat his C. difficile colitis.  He will continue the rest of his home medications and will follow-up with his providers.  Assessment and Plan: * Acute exacerbation of CHF (congestive heart failure) ([place]) - I suspect this is secondary to medication noncompliance - Patient tells me that he is taking his medication however on med reconciliation the majority of his medications were listed as taken in the last week - Status post furosemide 80 mg IV one-time dose per EDP -Holding further diuresis due to diarrhea - Continue home Coreg 3.125 mg p.o. twice daily, hydralazine 75 mg every 8 hours, isosorbide mononitrate 20 mg every 8 hours - Strict I's and O's   C. difficile colitis C. difficile PCR came back positive.  Denies any recent antibiotic use but sister was sick with similar symptoms. Continue to have abdominal pain and diarrhea, no leukocytosis. -Start him on fidaxomicin for 10 days -Encourage p.o. hydration  Acute kidney injury superimposed on CKD 3b ([place]) Creatinine improving and now appears to be at  baseline. -Monitor renal function -Avoid nephrotoxins  Type 2 diabetes mellitus with stage 3b chronic kidney disease, with long-term current use of insulin ([name]) Had an episode of hypoglycemia this morning requiring D50. - Long-acting insulin glargine 20 units daily -holding for today due to  hypoglycemia. - Insulin SSI with at bedtime coverage ordered - Goal inpatient blood glucose levels 140-180  Essential hypertension Blood pressure within goal - Continue hydralazine 75 mg every 8 hours,  -Continue carvedilol 3.125 mg p.o. twice daily  Weakness - Presumed secondary to diarrhea.  B12 within normal limit -PT/OT evaluation  Protein-calorie malnutrition, severe ([name]) Estimated body mass index is 21.3 kg/m as calculated from the following:   Height as of this encounter: 5\' 7"  (1.702 m).   Weight as of this encounter: 61.7 kg.   - Resumed feeding supplementation, Glucerna 3 times daily between meals  Elevated troponin - Presumed secondary to demand ischemia in setting of heart failure exacerbation, barely positive with a flat curve - Low clinical suspicion for ACS as patient denies chest pain and EKG was negative for ischemic changes EKG is negative for new changes when compared to prior in June 2023  Smoker - As needed nicotine patch ordered  Alcohol abuse - Patient states he does not drink alcohol any longer and states his last drink was several months ago.  HLD (hyperlipidemia) - Atorvastatin 40 mg nightly resumed   Consultants: None Procedures performed: None Disposition: Home health Diet recommendation:  Discharge Diet Orders (From admission, onward)     Start     Ordered   08/15/21 0000  Diet - low sodium heart healthy        08/15/21 1116           Cardiac and Carb modified diet DISCHARGE MEDICATION: Allergies as of 08/15/2021       Reactions   Pravastatin    Other reaction(s): Muscle pain   Simvastatin    Other reaction(s): Muscle pain        Medication List     TAKE these medications    acetaminophen 325 MG tablet Commonly known as: TYLENOL Take 2 tablets (650 mg total) by mouth every 6 (six) hours as needed for mild pain (or Fever >/= 101).   aspirin 81 MG chewable tablet Chew 1 tablet (81 mg total) by mouth daily.    atorvastatin 40 MG tablet Commonly known as: LIPITOR TAKE ONE TABLET BY MOUTH AT BEDTIME FOR CHOLESTEROL   carvedilol 3.125 MG tablet Commonly known as: COREG Take 1 tablet (3.125 mg total) by mouth 2 (two) times daily with a meal.   cholecalciferol 25 MCG (1000 UNIT) tablet Commonly known as: VITAMIN D3 Take 2,000 Units by mouth daily. Mon   Dificid 200 MG Tabs tablet Generic drug: fidaxomicin Take 1 tablet (200 mg total) by mouth 2 (two) times daily.   feeding supplement (GLUCERNA SHAKE) Liqd Take 237 mLs by mouth 3 (three) times daily between meals.   furosemide 40 MG tablet Commonly known as: LASIX Take 1 tablet (40 mg total) by mouth 2 (two) times daily.   hydrALAZINE 25 MG tablet Commonly known as: APRESOLINE Take 3 tablets (75 mg total) by mouth every 8 (eight) hours.   hydrOXYzine 10 MG tablet Commonly known as: ATARAX Take 10 mg by mouth 4 (four) times daily - after meals and at bedtime.   insulin glargine-yfgn 100 UNIT/ML Pen Commonly known as: SEMGLEE Inject 20 Units into the skin daily. What changed: how much to take  isosorbide dinitrate 20 MG tablet Commonly known as: ISORDIL Take 1 tablet (20 mg total) by mouth every 8 (eight) hours.   multivitamin with minerals Tabs tablet Take 1 tablet by mouth daily.   nicotine 21 mg/24hr patch Commonly known as: NICODERM CQ - dosed in mg/24 hours Place 1 patch (21 mg total) onto the skin daily.   NovoLOG FlexPen 100 UNIT/ML FlexPen Generic drug: insulin aspart Inject 6 Units into the skin 3 (three) times daily with meals.        Follow-up [place]. Schedule an appointment as soon as possible for a visit in 1 week(s).   Specialty: General Practice Contact information: [place] [address] [phone]         Minna Merritts, MD .   Specialty: Cardiology Contact information: [place]  [phone]                 Discharge Exam: Danley Danker Weights   08/13/21 1323  Weight: 61.7 kg   General.     In no acute distress. Pulmonary.  Lungs clear bilaterally, normal respiratory effort. CV.  Regular rate and rhythm, no JVD, rub or murmur. Abdomen.  Soft, nontender, nondistended, BS positive. CNS.  Alert and oriented .  No focal neurologic deficit. Extremities.  No edema, no cyanosis, pulses intact and symmetrical. Psychiatry.  Judgment and insight appears normal.   Condition at discharge: stable  The results of significant diagnostics from this hospitalization (including imaging, microbiology, ancillary and laboratory) are listed below for reference.   Imaging Studies: DG Chest 2 View  Result Date: 08/13/2021 CLINICAL DATA:  Volume overload, abdominal pain, swelling EXAM: CHEST - 2 VIEW COMPARISON:  06/29/2021 FINDINGS: Frontal and lateral views of the chest demonstrates stable enlargement of the cardiac silhouette. Since the prior exam, there has been significant improvement in volume status, with residual central vascular congestion and interstitial edema. There are small bilateral pleural effusions obscuring the posterior costophrenic angles. No pneumothorax. IMPRESSION: 1. Improving volume status since prior study, with residual interstitial edema and small bilateral effusions as above. Electronically Signed   By: Randa Ngo M.D.   On: 08/13/2021 15:22   CT Abdomen Pelvis W Contrast  Result Date: 08/13/2021 CLINICAL DATA:  Abdominal pain, acute, nonlocalized diarrhea, abd distention EXAM: CT ABDOMEN AND PELVIS WITH CONTRAST TECHNIQUE: Multidetector CT imaging of the abdomen and pelvis was performed using the standard protocol following bolus administration of intravenous contrast. RADIATION DOSE REDUCTION: This exam was performed according to the departmental dose-optimization program which includes automated exposure control, adjustment of the mA and/or kV according to patient size and/or  use of iterative reconstruction technique. CONTRAST:  53mL OMNIPAQUE IOHEXOL 300 MG/ML  SOLN COMPARISON:  CT AP, most recently 06/23/2021 and 01/12/2019. Chest XR, 06/29/2021. FINDINGS: Lower chest: Small volume bilateral pleural effusions with adjacent dependent atelectasis. Cardiomegaly. Hepatobiliary: No focal liver abnormality is seen. No gallstones, gallbladder wall thickening, or biliary dilatation. Pancreas: No pancreatic ductal dilatation or surrounding inflammatory changes. Spleen: Normal in size without focal abnormality. Adrenals/Urinary Tract: Adrenal glands are unremarkable. Kidneys are normal, without renal calculi, focal lesion, or hydronephrosis. Bladder is unremarkable. Stomach/Bowel: Stomach is within normal limits. Appendix is not definitively visualized. No evidence of bowel wall thickening, distention, or inflammatory changes. Vascular/Lymphatic: Aortic atherosclerosis without aneurysmal dilation. No enlarged abdominal or pelvic lymph nodes. Reproductive: Prostate is unremarkable. Other: Anasarca. Large volume intra-abdominal ascites. LEFT ascitic inguinal hernia. Penile  and scrotal edema. Musculoskeletal: No acute osseous findings. IMPRESSION: 1. Anasarca and additional findings of fluid overload, including; large volume intra-abdominal ascites, ascitic inguinal hernias, scrotal and body wall edema. 2. Cardiomegaly and small volume bilateral pleural effusions. Electronically Signed   By: Michaelle Birks M.D.   On: 08/13/2021 14:41    Microbiology: Results for orders placed or performed during the hospital encounter of 08/13/21  C Difficile Quick Screen w PCR reflex     Status: Abnormal   Collection Time: 08/13/21  8:10 PM   Specimen: STOOL  Result Value Ref Range Status   C Diff antigen POSITIVE (A) NEGATIVE Final   C Diff toxin NEGATIVE NEGATIVE Final   C Diff interpretation Results are indeterminate. See PCR results.  Final    Comment: Performed at [facility], [address] [place], [place] [address]  C. Diff by PCR, Reflexed     Status: Abnormal   Collection Time: 08/13/21  8:10 PM  Result Value Ref Range Status   Toxigenic C. Difficile by PCR POSITIVE (A) NEGATIVE Final    Comment: Positive for toxigenic C. difficile with little to no toxin production. Only treat if clinical presentation suggests symptomatic illness. Performed at [facility], [address] [place], [place] [address]   Gastrointestinal Panel by PCR , Stool     Status: None   Collection Time: 08/13/21  8:18 PM   Specimen: Stool  Result Value Ref Range Status   Campylobacter species NOT DETECTED NOT DETECTED Final   Plesimonas shigelloides NOT DETECTED NOT DETECTED Final   Salmonella species NOT DETECTED NOT DETECTED Final   Yersinia enterocolitica NOT DETECTED NOT DETECTED Final   Vibrio species NOT DETECTED NOT DETECTED Final   Vibrio cholerae NOT DETECTED NOT DETECTED Final   Enteroaggregative E coli (EAEC) NOT DETECTED NOT DETECTED Final   Enteropathogenic E coli (EPEC) NOT DETECTED NOT DETECTED Final   Enterotoxigenic E coli (ETEC) NOT DETECTED NOT DETECTED Final   Shiga like toxin producing E coli (STEC) NOT DETECTED NOT DETECTED Final   Shigella/Enteroinvasive E coli (EIEC) NOT DETECTED NOT DETECTED Final   Cryptosporidium NOT DETECTED NOT DETECTED Final   Cyclospora cayetanensis NOT DETECTED NOT DETECTED Final   Entamoeba histolytica NOT DETECTED NOT DETECTED Final   Giardia lamblia NOT DETECTED NOT DETECTED Final   Adenovirus F40/41 NOT DETECTED NOT DETECTED Final   Astrovirus NOT DETECTED NOT DETECTED Final   Norovirus GI/GII NOT DETECTED NOT DETECTED Final   Rotavirus A NOT DETECTED NOT DETECTED Final   Sapovirus (I, II, IV, and V) NOT DETECTED NOT DETECTED Final    Comment: Performed at [facility], [address] [place], [place] [address]    Labs: CBC: Recent Labs  Lab 08/13/21 1225 08/14/21 0513 08/15/21 0944  WBC 4.1 3.8* 3.3*  HGB  11.0* 9.8* 10.9*  HCT 36.2* 31.8* 34.7*  MCV 93.5 91.9 91.8  PLT 350 321 287   Basic Metabolic Panel: Recent Labs  Lab 08/13/21 1225 08/14/21 0513 08/15/21 0944  NA 136 141 139  K 3.5 3.2* 3.5  CL 108 111 111  CO2 21* 25 22  GLUCOSE 413* 137* 131*  BUN 39* 37* 37*  CREATININE 2.21* 2.04* 2.22*  CALCIUM 8.3* 8.5* 8.5*  MG  --  2.2  --    Liver Function Tests: Recent Labs  Lab 08/13/21 1225  AST 18  ALT 10  ALKPHOS 113  BILITOT 0.6  PROT 6.5  ALBUMIN 2.8*   CBG: Recent  Labs  Lab 08/14/21 1143 08/14/21 1213 08/14/21 1806 08/14/21 1954 08/15/21 0813  GLUCAP 56* 137* 114* 101* 100*    Discharge time spent: greater than 30 minutes.  This record has been created using Systems analyst. Errors have been sought and corrected,but may not always be located. Such creation errors do not reflect on the standard of care.   Signed: Lorella Nimrod, MD [facility] 08/15/2021

## 2021-08-15 NOTE — Evaluation (Signed)
Occupational Therapy Evaluation Patient Details Name: Shawn [name] MRN: [id number] DOB: 05/17/1955 Today's Date: 08/15/2021   History of Present Illness Shawn [name] is a 66 year old male with history of combined heart failure, insulin-dependent diabetes mellitus type 2 poorly controlled, CKD 3B, tobacco use, alcohol abuse, hypertension, severe malnutrition, history of CVA, who presents to the emergency department for chief concerns of swelling of his lower extremities.   Clinical Impression   Chart reviewed, RN cleared pt for participation in OT evaluation. Pt is alert and oriented x4, agreeable to OT evaluation however requires encouragement for participation. Bed mobility completed with MOD I, STS with RW with supervision, amb to bathroom, simulated toilet transfer with RW completed with supervision, intermittent vcs for safety. Further amb is limited by pt reported pain in groin area. RN notified. Pt declines further therapeutic intervention, educated on benefits of participation. Pt is left as received, NAD, all needs met. Pt presents with deficits in activity tolerance, endurance affecting ADL completion. Recommend discharge home with HHOT to address functional deficits. OT will continue to follow acutely.      Recommendations for follow up therapy are one component of a multi-disciplinary discharge planning process, led by the attending physician.  Recommendations may be updated based on patient status, additional functional criteria and insurance authorization.   Follow Up Recommendations  Home health OT    Assistance Recommended at Discharge Intermittent Supervision/Assistance  Patient can return home with the following A little help with walking and/or transfers;A little help with bathing/dressing/bathroom    Functional Status Assessment  Patient has had a recent decline in their functional status and demonstrates the ability to make significant improvements in function  in a reasonable and predictable amount of time.  Equipment Recommendations  None recommended by OT    Recommendations for Other Services       Precautions / Restrictions Precautions Precautions: Fall Restrictions Weight Bearing Restrictions: No      Mobility Bed Mobility Overal bed mobility: Modified Independent                  Transfers Overall transfer level: Needs assistance Equipment used: Rolling walker (2 wheels) Transfers: Sit to/from Stand Sit to Stand: Supervision                  Balance Overall balance assessment: Needs assistance Sitting-balance support: Feet supported, No upper extremity supported Sitting balance-Leahy Scale: Good     Standing balance support: Bilateral upper extremity supported, During functional activity, Reliant on assistive device for balance Standing balance-Leahy Scale: Fair                             ADL either performed or assessed with clinical judgement   ADL Overall ADL's : Needs assistance/impaired Eating/Feeding: Set up;Sitting   Grooming: Wash/dry hands;Sitting;Standing;Supervision/safety                   Toilet Transfer: Supervision/safety;Ambulation;Rolling walker (2 wheels)           Functional mobility during ADLs: Supervision/safety;Rolling walker (2 wheels) (household distances)       Vision Patient Visual Report: No change from baseline       Perception     Praxis      Pertinent Vitals/Pain Pain Assessment Pain Assessment: 0-10 Pain Score: 10-Worst pain ever Pain Location: groin where mepliex pads are, testicles Pain Descriptors / Indicators: Grimacing, Discomfort Pain Intervention(s): Limited activity within patient's tolerance, Monitored during session,  Patient requesting pain meds-RN notified, Repositioned     Hand Dominance     Extremity/Trunk Assessment Upper Extremity Assessment Upper Extremity Assessment: Overall WFL for tasks assessed   Lower  Extremity Assessment Lower Extremity Assessment: Generalized weakness   Cervical / Trunk Assessment Cervical / Trunk Assessment: Normal   Communication Communication Communication: No difficulties   Cognition Arousal/Alertness: Awake/alert Behavior During Therapy: WFL for tasks assessed/performed Overall Cognitive Status: Within Functional Limits for tasks assessed                                       General Comments       Exercises Other Exercises Other Exercises: edu re; role of OT, role of rehab, discharge recommendations, home safety, falls prevention, DME use   Shoulder Instructions      Home Living Family/patient expects to be discharged to:: Private residence Living Arrangements: Other relatives Available Help at Discharge: Family;Available PRN/intermittently Type of Home: House Home Access: Stairs to enter [employer] of Steps: 2 Entrance Stairs-Rails: Can reach both Home Layout: One level     Bathroom Shower/Tub: Teacher, early years/pre: Handicapped height     Home Equipment: Cane - single point;Rollator (4 wheels);Grab bars - tub/shower;BSC/3in1;Tub bench          Prior Functioning/Environment Prior Level of Function : Independent/Modified Independent             Mobility Comments: pt reports RW use in house, [facility] community distances ADLs Comments: reports MOD I for ADL, assist for IADLs as needed        OT Problem List: Decreased strength;Decreased activity tolerance;Impaired balance (sitting and/or standing);Decreased knowledge of use of DME or AE;Decreased safety awareness;Decreased knowledge of precautions      OT Treatment/Interventions: Self-care/ADL training;Patient/family education;Therapeutic exercise;DME and/or AE instruction;Neuromuscular education;Balance training;Energy conservation;Therapeutic activities    OT Goals(Current goals can be found in the care plan section) Acute Rehab OT  Goals Patient Stated Goal: go home OT Goal Formulation: With patient Time For Goal Achievement: 08/29/21 Potential to Achieve Goals: Good ADL Goals Pt Will Perform Lower Body Dressing: with modified independence;sit to/from stand Pt Will Transfer to Toilet: with modified independence;ambulating Pt Will Perform Toileting - Clothing Manipulation and hygiene: with modified independence;sit to/from stand  OT Frequency: Min 2X/week    Co-evaluation              AM-PAC OT "6 Clicks" Daily Activity     Outcome Measure Help from another person eating meals?: None Help from another person taking care of personal grooming?: None Help from another person toileting, which includes using toliet, bedpan, or urinal?: A Little Help from another person bathing (including washing, rinsing, drying)?: A Little Help from another person to put on and taking off regular upper body clothing?: None Help from another person to put on and taking off regular lower body clothing?: A Little 6 Click Score: 21   End of Session Equipment Utilized During Treatment: Rolling walker (2 wheels) Nurse Communication: Mobility status  Activity Tolerance: Patient tolerated treatment well Patient left: in bed;with call bell/phone within reach;with bed alarm set  OT Visit Diagnosis: Unsteadiness on feet (R26.81);Muscle weakness (generalized) (M62.81)                Time: 6767-2094 OT Time Calculation (min): 11 min Charges:  OT General Charges $OT Visit: 1 Visit OT Evaluation $OT Eval Low Complexity: 1 Low  Shanon Payor, OTD OTR/L  08/15/21, 10:30 AM

## 2021-08-15 NOTE — TOC Transition Note (Signed)
Transition of Care [facility]) - CM/SW Discharge Note   Patient Details  Name: Shawn [name] MRN: [id number] Date of Birth: 1955/06/06  Transition of Care [facility]) CM/SW Contact:  Alberteen Sam, LCSW Phone Number: 08/15/2021, 11:40 AM   Clinical Narrative:     Patient to resume [place] through [facility], orders are in and [place] with [facility] aware of dc today. Pharmacy to provide meds, no further dc needs.   Final next level of care: [place] Barriers to Discharge: No Barriers Identified   Patient Goals and CMS Choice Patient states their goals for this hospitalization and ongoing recovery are:: to go home CMS Medicare.gov Compare Post Acute Care list provided to:: Patient Choice offered to / list presented to : Patient  Discharge Placement                       Discharge Plan and Services                                     Social Determinants of Health (SDOH) Interventions     Readmission Risk Interventions    06/24/2021    2:57 PM 01/08/2021    1:22 PM  Readmission Risk Prevention Plan  Transportation Screening Complete Complete  Social Work Consult for [place] Planning/Counseling  Complete  Palliative Care Screening  Not Applicable  Medication Review Press photographer) Complete Complete  Palliative Care Screening Not [place] Not Applicable

## 2021-08-27 ENCOUNTER — Other Ambulatory Visit: Payer: 59 | Admitting: Nurse Practitioner

## 2021-09-03 ENCOUNTER — Telehealth: Payer: Self-pay | Admitting: Family

## 2021-09-03 ENCOUNTER — Ambulatory Visit: Payer: 59 | Admitting: Family

## 2021-09-03 NOTE — Progress Notes (Deleted)
Patient ID: Shawn [name], male    DOB: [birth year], 66 y.o.   MRN: [id number]  HPI  [name] is a 66 y/o male with a history of DM, HTN, CKD, stroke, current tobacco use and chronic heart failure.   Echo report from 06/26/21 reviewed and showed an EF of 25-30% along with mild/moderate MR.   ED Visit 08/13/21 Acute on Chronic Congestive Heart failure  Admitted 04/30/19 due to weakness and hypotension. Given IV fluids. Aldactone stopped and torsemide decreased. Discharged the following day. Admitted 04/23/19 due to weakness, diarrhea and hypotension. Placed on IV insulin drip and given IV fluids. Diarrhea was thought to be due to recent COVID vaccine. Discharged after 3 days.  Was in the ED 03/30/19 due to abdominal distention. Spironolactone increased due to ascites and he was released.   He presents today for a follow-up visit with a chief complaint of minimal fatigue upon moderate exertion. He describes this as chronic in nature having been present for several years. He has associated leg weakness along with this. He denies any difficulty sleeping, dizziness, abdominal distention, palpitations, pedal edema, chest pain, shortness of breath, cough or weight gain.    Wife died suddenly 07-17-2019 so his sister is now helping get him to appointments etc.   Past Medical History:  Diagnosis Date   Chronic combined systolic (congestive) and diastolic (congestive) heart failure ([place])    a. TTE 6/15: EF < 20%, mildly dilated LV, DD, mildly dilated LA, mod dilated RA, mild MR, mild to mod TR, mod increased posterior Kalicia Dufresne thickness, elevated LA and LVEDP; b. 4/12019 Echo: EF 20-25%, diff HK. Gr1 DD, nl RV fxn.   CKD (chronic kidney disease), stage II    Diabetes mellitus with complication ([place])    a. Prior admissions w/ DKA (last 12/2017).   Essential hypertension    NICM (nonischemic cardiomyopathy) ([place])    a. 16-Jul-2013 Echo: EF<20%; b. 2013/07/16 Cath: no signif dzs; c. 04/2017 Echo: 20-25%, gr1 DD.;  d.05/2018 Echo: 25-30%   Polysubstance abuse ([place])    a. etoh and tobacco   Stroke [facility])    Past Surgical History:  Procedure Laterality Date   CENTRAL LINE INSERTION N/A 06/29/2021   Procedure: CENTRAL LINE INSERTION;  Surgeon: Nelva Bush, MD;  Location: [place] [facility];  Service: Cardiovascular;  Laterality: N/A;   NO PAST SURGERIES     RIGHT HEART CATH N/A 06/29/2021   Procedure: RIGHT HEART CATH;  Surgeon: Nelva Bush, MD;  Location: [place] [facility];  Service: Cardiovascular;  Laterality: N/A;   Family History  Problem Relation Age of Onset   Congestive Heart Failure Mother    Diabetes Mother    Congestive Heart Failure Brother    Social History   Tobacco Use   Smoking status: Every Day    Packs/day: 0.50    Types: Cigarettes   Smokeless tobacco: Never  Substance Use Topics   Alcohol use: Not Currently   Allergies  Allergen Reactions   Pravastatin     Other reaction(s): Muscle pain   Simvastatin     Other reaction(s): Muscle pain    Prior to Admission medications   Medication Sig Start Date End Date Taking? Authorizing Provider  aspirin 81 MG chewable tablet Chew 1 tablet (81 mg total) by mouth daily. 04/30/17  Yes Demetrios Loll, MD  atorvastatin (LIPITOR) 20 MG tablet Take 20 mg by mouth daily.   Yes [provider]  B-Complex-C TABS Take 1 tablet by mouth  daily.   Yes [provider]  cetirizine (ZYRTEC) 10 MG tablet Take 10 mg by mouth daily.   Yes [provider]  dapagliflozin propanediol (FARXIGA) 10 MG TABS tablet Take 10 mg by mouth daily before breakfast. 05/15/19  Yes Darylene Price A, FNP  docusate sodium (COLACE) 100 MG capsule Take 100 mg by mouth 2 (two) times daily.   Yes [provider]  Exenatide ER 2 MG PEN Inject 2 mg into the skin once a week.   Yes [provider]  insulin aspart (NOVOLOG) 100 UNIT/ML injection Inject 5 Units into the skin 3 (three) times daily with meals.   Yes [provider]  insulin glargine (LANTUS) 100 UNIT/ML injection Inject 15-20 Units into the skin at bedtime.    Yes [provider]  omeprazole (PRILOSEC) 20 MG capsule Take 20 mg by mouth daily. 12/18/18  Yes [provider]  sacubitril-valsartan (ENTRESTO) 24-26 MG Take 1 tablet by mouth 2 (two) times daily. 05/01/19  Yes Mercy Riding, MD  torsemide (DEMADEX) 20 MG tablet Take 20 mg by mouth daily as needed.   Yes [provider]  polyethylene glycol (MIRALAX / GLYCOLAX) 17 g packet Take 17 g by mouth daily as needed. Patient not taking: Reported on 08/14/2019 05/01/19   Mercy Riding, MD    Review of Systems  Constitutional:  Positive for fatigue. Negative for appetite change.  HENT:  Negative for congestion, postnasal drip, sneezing and sore throat.   Eyes: Negative.   Respiratory:  Negative for cough, chest tightness and shortness of breath.   Cardiovascular:  Negative for chest pain, palpitations and leg swelling.  Gastrointestinal:  Positive for constipation. Negative for abdominal distention and abdominal pain.  Endocrine: Negative.   Genitourinary: Negative.   Musculoskeletal:  Negative for back pain, myalgias and neck pain.  Skin: Negative.   Allergic/Immunologic: Negative.   Neurological:  Positive for weakness (legs are improving). Negative for dizziness and light-headedness.  Hematological:  Negative for adenopathy. Does not bruise/bleed easily.  Psychiatric/Behavioral:  Negative for dysphoric mood and sleep disturbance (sleeping on 2 pillows). The patient is not nervous/anxious.      There were no vitals filed for this visit.  Wt Readings from Last 3 Encounters:  08/13/21 136 lb 0.4 oz (61.7 kg)  07/07/21 136 lb 1.6 oz (61.7 kg)  05/11/21 130 lb (59 kg)   Lab Results  Component Value Date   CREATININE 2.22 (H) 08/15/2021   CREATININE 2.04 (H) 08/14/2021   CREATININE 2.21 (H) 08/13/2021    Physical Exam Vitals and nursing note reviewed.   Constitutional:      Appearance: Normal appearance.  HENT:     Head: Normocephalic and atraumatic.  Cardiovascular:     Rate and Rhythm: Normal rate and regular rhythm.  Pulmonary:     Effort: Pulmonary effort is normal.     Breath sounds: Normal breath sounds. No wheezing or rales.  Abdominal:     General: There is distension.     Tenderness: There is no abdominal tenderness.  Musculoskeletal:        General: No tenderness.     Cervical back: Normal range of motion and neck supple.     Right lower leg: No edema.     Left lower leg: Edema (trace pitting) present.  Skin:    General: Skin is warm and dry.  Neurological:     General: No focal deficit present.     Mental Status: He is  alert and oriented to person, place, and time.  Psychiatric:        Mood and Affect: Mood normal.        Behavior: Behavior normal.        Thought Content: Thought content normal.    Assessment & Plan:  1: Chronic heart failure with reduced ejection fraction- - NYHA class II - euvolemic today - weighing daily; Reminded to call for an overnight weight gain of >2 pounds or a weekly weight gain of >5 pounds - weight up 20 pounds from last visit here 3 months ago (patient says that his weight has stabilized after getting paracentesis done) - abdomen distended today but he says that he's constipated; encouraged him to resume miralax - not adding salt  - saw cardiology Mickle Plumb) 06/28/19 - BNP 08/13/21 was 3781.8 - reports receiving both COVID vaccines  2: HTN- - BP looks good today - sees PCP at the [place] on 08/16/19; advised to bring copy of lab results to his cardiology appointment in September - BMP 08/15/21 reviewed and showed sodium 139, potassium 3.5, creatinine 2.22 and GFR 32  3: DM- - saw endocrinology Pasty Arch) 03/15/19 - A1c 06/24/21 was 11.9% - home glucose this morning was  - saw nephrology([place]) 03/13/19   Patient did not bring his medications nor a list. Each medication was verbally  reviewed with the patient and he was encouraged to bring the bottles to every visit to confirm accuracy of list.  Return in 6 months or sooner for any questions/problems before then.

## 2021-09-03 NOTE — Telephone Encounter (Signed)
Patient did not show for his [facility] appointment on 09/03/21. Will attempt to reschedule.

## 2021-09-10 ENCOUNTER — Other Ambulatory Visit: Payer: 59 | Admitting: Nurse Practitioner

## 2021-09-11 ENCOUNTER — Other Ambulatory Visit: Payer: 59 | Admitting: Nurse Practitioner

## 2021-09-21 ENCOUNTER — Telehealth: Payer: Self-pay

## 2021-09-21 NOTE — Telephone Encounter (Signed)
Spoke with patient's sister regarding rescheduling Palliative Care consult. She states patient I currently admitted to [facility].

## 2021-12-03 ENCOUNTER — Other Ambulatory Visit: Payer: Self-pay

## 2022-05-06 ENCOUNTER — Encounter: Payer: Self-pay | Admitting: Emergency Medicine

## 2022-05-06 ENCOUNTER — Emergency Department
Admission: EM | Admit: 2022-05-06 | Discharge: 2022-05-07 | Disposition: A | Discharge status: Home / Self Care | Payer: No Typology Code available for payment source | Attending: Emergency Medicine | Admitting: Emergency Medicine

## 2022-05-06 DIAGNOSIS — E1165 Type 2 diabetes mellitus with hyperglycemia: Secondary | ICD-10-CM | POA: Diagnosis not present

## 2022-05-06 DIAGNOSIS — Z992 Dependence on renal dialysis: Secondary | ICD-10-CM | POA: Diagnosis not present

## 2022-05-06 DIAGNOSIS — N186 End stage renal disease: Secondary | ICD-10-CM | POA: Diagnosis not present

## 2022-05-06 DIAGNOSIS — R739 Hyperglycemia, unspecified: Secondary | ICD-10-CM | POA: Diagnosis present

## 2022-05-06 DIAGNOSIS — I509 Heart failure, unspecified: Secondary | ICD-10-CM | POA: Insufficient documentation

## 2022-05-06 DIAGNOSIS — E1122 Type 2 diabetes mellitus with diabetic chronic kidney disease: Secondary | ICD-10-CM | POA: Insufficient documentation

## 2022-05-06 DIAGNOSIS — Z8673 Personal history of transient ischemic attack (TIA), and cerebral infarction without residual deficits: Secondary | ICD-10-CM | POA: Insufficient documentation

## 2022-05-06 LAB — CBC WITH DIFFERENTIAL/PLATELET
Abs Immature Granulocytes: 0.01 10*3/uL (ref 0.00–0.07)
Basophils Absolute: 0.1 10*3/uL (ref 0.0–0.1)
Basophils Relative: 2 %
Eosinophils Absolute: 0.1 10*3/uL (ref 0.0–0.5)
Eosinophils Relative: 4 %
HCT: 31.5 % — ABNORMAL LOW (ref 39.0–52.0)
Hemoglobin: 9.8 g/dL — ABNORMAL LOW (ref 13.0–17.0)
Immature Granulocytes: 0 %
Lymphocytes Relative: 24 %
Lymphs Abs: 0.9 10*3/uL (ref 0.7–4.0)
MCH: 31 pg (ref 26.0–34.0)
MCHC: 31.1 g/dL (ref 30.0–36.0)
MCV: 99.7 fL (ref 80.0–100.0)
Monocytes Absolute: 0.4 10*3/uL (ref 0.1–1.0)
Monocytes Relative: 11 %
Neutro Abs: 2.4 10*3/uL (ref 1.7–7.7)
Neutrophils Relative %: 59 %
Platelets: 248 10*3/uL (ref 150–400)
RBC: 3.16 MIL/uL — ABNORMAL LOW (ref 4.22–5.81)
RDW: 16.5 % — ABNORMAL HIGH (ref 11.5–15.5)
WBC: 4 10*3/uL (ref 4.0–10.5)
nRBC: 0 % (ref 0.0–0.2)

## 2022-05-06 LAB — COMPREHENSIVE METABOLIC PANEL
ALT: 10 U/L (ref 0–44)
AST: 32 U/L (ref 15–41)
Albumin: 3 g/dL — ABNORMAL LOW (ref 3.5–5.0)
Alkaline Phosphatase: 110 U/L (ref 38–126)
Anion gap: 12 (ref 5–15)
BUN: 38 mg/dL — ABNORMAL HIGH (ref 8–23)
CO2: 21 mmol/L — ABNORMAL LOW (ref 22–32)
Calcium: 8.2 mg/dL — ABNORMAL LOW (ref 8.9–10.3)
Chloride: 98 mmol/L (ref 98–111)
Creatinine, Ser: 3.81 mg/dL — ABNORMAL HIGH (ref 0.61–1.24)
GFR, Estimated: 17 mL/min — ABNORMAL LOW (ref >60)
Glucose, Bld: 612 mg/dL (ref 70–99)
Potassium: 3.7 mmol/L (ref 3.5–5.1)
Sodium: 131 mmol/L — ABNORMAL LOW (ref 135–145)
Total Bilirubin: 0.6 mg/dL (ref 0.3–1.2)
Total Protein: 6.8 g/dL (ref 6.5–8.1)

## 2022-05-06 LAB — BETA-HYDROXYBUTYRIC ACID: Beta-Hydroxybutyric Acid: 0.17 mmol/L (ref 0.05–0.27)

## 2022-05-06 LAB — CBG MONITORING, ED
Glucose-Capillary: 347 mg/dL — ABNORMAL HIGH (ref 70–99)
Glucose-Capillary: 375 mg/dL — ABNORMAL HIGH (ref 70–99)
Glucose-Capillary: 401 mg/dL — ABNORMAL HIGH (ref 70–99)
Glucose-Capillary: 529 mg/dL (ref 70–99)

## 2022-05-06 MED ORDER — INSULIN ASPART 100 UNIT/ML IJ SOLN
5.0000 [IU] | Freq: Once | INTRAMUSCULAR | Status: AC
  Administered 2022-05-06: 5 [IU] via INTRAVENOUS
  Filled 2022-05-06: qty 1

## 2022-05-06 NOTE — ED Provider Notes (Signed)
[facility] Provider Note    Event Date/Time   First MD Initiated Contact with Patient 05/06/22 2122     (approximate)   History   Chief Complaint Hyperglycemia   HPI  Shawn [name] is a 67 y.o. male with past medical history of hyperlipidemia, diabetes, CHF, stroke, ESRD on HD (MWF), and alcohol abuse who presents to the ED complaining of hyperglycemia.  Patient reports that his blood sugars have been running high over the course of the day today, but states he has otherwise been feeling well.  He states he has been compliant with his insulin regimen, denies taking any medications other than insulin for his diabetes.  He states he takes both long and short acting insulin, but has not missed any doses.  He denies any fevers, cough, chest pain, shortness of breath, nausea, vomiting, or diarrhea.  He reports receiving a full run of dialysis yesterday with no issues.     Physical Exam   Triage Vital Signs: ED Triage Vitals  Enc Vitals Group     BP 05/06/22 1930 131/87     Pulse Rate 05/06/22 1930 74     Resp 05/06/22 1930 18     Temp 05/06/22 1930 98.3 F (36.8 C)     Temp Source 05/06/22 1930 Oral     SpO2 05/06/22 1930 95 %     Weight 05/06/22 1927 139 lb 5.3 oz (63.2 kg)     Height 05/06/22 1927 5\' 7"  (1.702 m)     Head Circumference --      Peak Flow --      Pain Score 05/06/22 1934 0     Pain Loc --      Pain Edu? --      Excl. in GC? --     Most recent vital signs: Vitals:   05/06/22 2200 05/06/22 2230  BP: 128/86 (!) 142/88  Pulse: 71 72  Resp: 20 18  Temp:    SpO2: 100% 95%    Constitutional: Alert and oriented. Eyes: Conjunctivae are normal. Head: Atraumatic. Nose: No congestion/rhinnorhea. Mouth/Throat: Mucous membranes are moist.  Cardiovascular: Normal rate, regular rhythm. Grossly normal heart sounds.  2+ radial pulses bilaterally. Respiratory: Normal respiratory effort.  No retractions. Lungs CTAB.  Right chest wall  TDC intact. Gastrointestinal: Soft and nontender. No distention. Musculoskeletal: No lower extremity tenderness, 1+ pitting edema to feet bilaterally. Neurologic:  Normal speech and language. No gross focal neurologic deficits are appreciated.    ED Results / Procedures / Treatments   Labs (all labs ordered are listed, but only abnormal results are displayed) Labs Reviewed  CBC WITH DIFFERENTIAL/PLATELET - Abnormal; Notable for the following components:      Result Value   RBC 3.16 (*)    Hemoglobin 9.8 (*)    HCT 31.5 (*)    RDW 16.5 (*)    All other components within normal limits  COMPREHENSIVE METABOLIC PANEL - Abnormal; Notable for the following components:   Sodium 131 (*)    CO2 21 (*)    Glucose, Bld 612 (*)    BUN 38 (*)    Creatinine, Ser 3.81 (*)    Calcium 8.2 (*)    Albumin 3.0 (*)    GFR, Estimated 17 (*)    All other components within normal limits  CBG MONITORING, ED - Abnormal; Notable for the following components:   Glucose-Capillary 529 (*)    All other components within normal limits  CBG MONITORING, ED -  Abnormal; Notable for the following components:   Glucose-Capillary 401 (*)    All other components within normal limits  CBG MONITORING, ED - Abnormal; Notable for the following components:   Glucose-Capillary 375 (*)    All other components within normal limits  BETA-HYDROXYBUTYRIC ACID  URINALYSIS, ROUTINE W REFLEX MICROSCOPIC     EKG  ED ECG REPORT I, Chesley Noon, the attending physician, personally viewed and interpreted this ECG.   Date: 05/06/2022  EKG Time: 19:35  Rate: 74  Rhythm: normal sinus rhythm  Axis: LAD  Intervals:first-degree A-V block   ST&T Change: Lateral T wave inversions, similar to previous  PROCEDURES:  Critical Care performed: No  Procedures   MEDICATIONS ORDERED IN ED: Medications  insulin aspart (novoLOG) injection 5 Units (5 Units Intravenous Given 05/06/22 2207)  insulin aspart (novoLOG) injection 5  Units (5 Units Intravenous Given 05/06/22 2244)     IMPRESSION / MDM / ASSESSMENT AND PLAN / ED COURSE  I reviewed the triage vital signs and the nursing notes.                              67 y.o. male with past medical history of hyperlipidemia, diabetes, CHF, stroke, ESRD on HD (MWF), and alcohol abuse who presents to the ED complaining of hyperglycemia over the course of the day today, denies other symptoms.  Patient's presentation is most consistent with acute presentation with potential threat to life or bodily function.  Differential diagnosis includes, but is not limited to, hyperglycemia, DKA, HHS, electrolyte abnormality.  Patient nontoxic-appearing and in no acute distress, vital signs are unremarkable.  He denies any complaints with this and states he has been compliant with his insulin regimen.  Labs show hyperglycemia with no evidence of DKA as there is no anion gap or elevation in beta hydroxybutyrate.  He is mildly acidotic but this is likely due to his ESRD.  No acute electrolyte abnormality noted, anemia stable compared to previous with no significant leukocytosis.  No symptoms to suggest infectious process, LFTs are unremarkable.  He was given 500 cc IV fluids with EMS, will avoid fluid overloading this patient with history of CHF and ESRD, treat ongoing hyperglycemia with IV insulin.  Blood glucose improving, patient remains asymptomatic here in the ED.  He is appropriate for discharge home with follow-up at the [facility], where he receives his primary care.  He was counseled to return to the ED for new or worsening symptoms, patient agrees with plan.      FINAL CLINICAL IMPRESSION(S) / ED DIAGNOSES   Final diagnoses:  Hyperglycemia     Rx / DC Orders   ED Discharge Orders     None        Note:  This document was prepared using Dragon voice recognition software and may include unintentional dictation errors.   Chesley Noon, MD 05/06/22 [phone]

## 2022-05-06 NOTE — ED Triage Notes (Addendum)
Pt presents via [facility] from home with complaints of hyperglycemia. Pt has had CBGs >450 over the last 2 days with associated diarrhea, decreased appetite, and weakness. Endorses compliance with medications. CBG with EMS 495 and he received of NS PTA. Hx of CHF & Dialysis (MWF - completed session yesterday without issue). A&Ox4 at this time. Denies CP or SOB.    CBG in triage 529.

## 2022-05-06 NOTE — ED Notes (Signed)
Discussed the patient with Dr. Larinda Buttery; due to patients PMHx defer additional IV fluids at this time.

## 2022-05-06 NOTE — Discharge Instructions (Addendum)
Take your diabetes medications as prescribed and see your doctor this week to talk about ongoing blood sugar management.

## 2022-05-07 NOTE — ED Notes (Signed)
Pt's sister was called for pt's transport. Pt wheeled to lobby awaiting ride

## 2022-08-28 ENCOUNTER — Inpatient Hospital Stay
Admission: EM | Admit: 2022-08-28 | Discharge: 2022-08-31 | DRG: 637 | Disposition: A | Discharge status: Home / Self Care | Payer: 59 | Attending: Internal Medicine | Admitting: Internal Medicine

## 2022-08-28 ENCOUNTER — Emergency Department: Payer: 59

## 2022-08-28 DIAGNOSIS — E876 Hypokalemia: Secondary | ICD-10-CM | POA: Diagnosis not present

## 2022-08-28 DIAGNOSIS — Z7401 Bed confinement status: Secondary | ICD-10-CM | POA: Diagnosis not present

## 2022-08-28 DIAGNOSIS — E875 Hyperkalemia: Secondary | ICD-10-CM | POA: Diagnosis present

## 2022-08-28 DIAGNOSIS — Z1152 Encounter for screening for COVID-19: Secondary | ICD-10-CM | POA: Diagnosis not present

## 2022-08-28 DIAGNOSIS — E1122 Type 2 diabetes mellitus with diabetic chronic kidney disease: Secondary | ICD-10-CM | POA: Diagnosis present

## 2022-08-28 DIAGNOSIS — Z8249 Family history of ischemic heart disease and other diseases of the circulatory system: Secondary | ICD-10-CM | POA: Diagnosis not present

## 2022-08-28 DIAGNOSIS — Z681 Body mass index (BMI) 19 or less, adult: Secondary | ICD-10-CM | POA: Diagnosis not present

## 2022-08-28 DIAGNOSIS — N186 End stage renal disease: Secondary | ICD-10-CM | POA: Diagnosis not present

## 2022-08-28 DIAGNOSIS — R54 Age-related physical debility: Secondary | ICD-10-CM | POA: Diagnosis present

## 2022-08-28 DIAGNOSIS — I5043 Acute on chronic combined systolic (congestive) and diastolic (congestive) heart failure: Secondary | ICD-10-CM | POA: Diagnosis present

## 2022-08-28 DIAGNOSIS — Z833 Family history of diabetes mellitus: Secondary | ICD-10-CM

## 2022-08-28 DIAGNOSIS — I132 Hypertensive heart and chronic kidney disease with heart failure and with stage 5 chronic kidney disease, or end stage renal disease: Secondary | ICD-10-CM | POA: Diagnosis present

## 2022-08-28 DIAGNOSIS — Z888 Allergy status to other drugs, medicaments and biological substances status: Secondary | ICD-10-CM

## 2022-08-28 DIAGNOSIS — E039 Hypothyroidism, unspecified: Secondary | ICD-10-CM | POA: Diagnosis present

## 2022-08-28 DIAGNOSIS — R630 Anorexia: Secondary | ICD-10-CM | POA: Diagnosis present

## 2022-08-28 DIAGNOSIS — I2510 Atherosclerotic heart disease of native coronary artery without angina pectoris: Secondary | ICD-10-CM | POA: Diagnosis present

## 2022-08-28 DIAGNOSIS — Z91128 Patient's intentional underdosing of medication regimen for other reason: Secondary | ICD-10-CM

## 2022-08-28 DIAGNOSIS — E785 Hyperlipidemia, unspecified: Secondary | ICD-10-CM | POA: Diagnosis present

## 2022-08-28 DIAGNOSIS — T383X6A Underdosing of insulin and oral hypoglycemic [antidiabetic] drugs, initial encounter: Secondary | ICD-10-CM | POA: Diagnosis present

## 2022-08-28 DIAGNOSIS — R112 Nausea with vomiting, unspecified: Secondary | ICD-10-CM

## 2022-08-28 DIAGNOSIS — Z79899 Other long term (current) drug therapy: Secondary | ICD-10-CM

## 2022-08-28 DIAGNOSIS — Z794 Long term (current) use of insulin: Secondary | ICD-10-CM

## 2022-08-28 DIAGNOSIS — E871 Hypo-osmolality and hyponatremia: Secondary | ICD-10-CM | POA: Diagnosis present

## 2022-08-28 DIAGNOSIS — G9341 Metabolic encephalopathy: Secondary | ICD-10-CM | POA: Diagnosis not present

## 2022-08-28 DIAGNOSIS — F17210 Nicotine dependence, cigarettes, uncomplicated: Secondary | ICD-10-CM | POA: Diagnosis present

## 2022-08-28 DIAGNOSIS — Z992 Dependence on renal dialysis: Secondary | ICD-10-CM

## 2022-08-28 DIAGNOSIS — E1110 Type 2 diabetes mellitus with ketoacidosis without coma: Principal | ICD-10-CM | POA: Diagnosis present

## 2022-08-28 DIAGNOSIS — Z79890 Hormone replacement therapy (postmenopausal): Secondary | ICD-10-CM

## 2022-08-28 DIAGNOSIS — I5022 Chronic systolic (congestive) heart failure: Secondary | ICD-10-CM | POA: Diagnosis present

## 2022-08-28 DIAGNOSIS — I428 Other cardiomyopathies: Secondary | ICD-10-CM | POA: Diagnosis present

## 2022-08-28 DIAGNOSIS — R6510 Systemic inflammatory response syndrome (SIRS) of non-infectious origin without acute organ dysfunction: Secondary | ICD-10-CM | POA: Diagnosis present

## 2022-08-28 DIAGNOSIS — D631 Anemia in chronic kidney disease: Secondary | ICD-10-CM | POA: Diagnosis present

## 2022-08-28 DIAGNOSIS — Z7982 Long term (current) use of aspirin: Secondary | ICD-10-CM

## 2022-08-28 DIAGNOSIS — E1010 Type 1 diabetes mellitus with ketoacidosis without coma: Secondary | ICD-10-CM | POA: Diagnosis present

## 2022-08-28 DIAGNOSIS — I5023 Acute on chronic systolic (congestive) heart failure: Secondary | ICD-10-CM | POA: Diagnosis present

## 2022-08-28 DIAGNOSIS — Z8673 Personal history of transient ischemic attack (TIA), and cerebral infarction without residual deficits: Secondary | ICD-10-CM

## 2022-08-28 LAB — CBC
HCT: 27.8 % — ABNORMAL LOW (ref 39.0–52.0)
HCT: 29.1 % — ABNORMAL LOW (ref 39.0–52.0)
Hemoglobin: 8.3 g/dL — ABNORMAL LOW (ref 13.0–17.0)
Hemoglobin: 8.4 g/dL — ABNORMAL LOW (ref 13.0–17.0)
MCH: 28.6 pg (ref 26.0–34.0)
MCH: 28.7 pg (ref 26.0–34.0)
MCHC: 28.9 g/dL — ABNORMAL LOW (ref 30.0–36.0)
MCHC: 29.9 g/dL — ABNORMAL LOW (ref 30.0–36.0)
MCV: 95.9 fL (ref 80.0–100.0)
MCV: 99.3 fL (ref 80.0–100.0)
Platelets: 231 10*3/uL (ref 150–400)
Platelets: 241 10*3/uL (ref 150–400)
RBC: 2.9 MIL/uL — ABNORMAL LOW (ref 4.22–5.81)
RBC: 2.93 MIL/uL — ABNORMAL LOW (ref 4.22–5.81)
RDW: 17.2 % — ABNORMAL HIGH (ref 11.5–15.5)
RDW: 17.2 % — ABNORMAL HIGH (ref 11.5–15.5)
WBC: 9.7 10*3/uL (ref 4.0–10.5)
WBC: 9.9 10*3/uL (ref 4.0–10.5)
nRBC: 0 % (ref 0.0–0.2)
nRBC: 0 % (ref 0.0–0.2)

## 2022-08-28 LAB — LACTIC ACID, PLASMA
Lactic Acid, Venous: 2 mmol/L (ref 0.5–1.9)
Lactic Acid, Venous: 1.6 mmol/L (ref 0.5–1.9)

## 2022-08-28 LAB — CBC WITH DIFFERENTIAL/PLATELET
Abs Immature Granulocytes: 0.03 10*3/uL (ref 0.00–0.07)
Basophils Absolute: 0 10*3/uL (ref 0.0–0.1)
Basophils Relative: 0 %
Eosinophils Absolute: 0 10*3/uL (ref 0.0–0.5)
Eosinophils Relative: 0 %
HCT: 30.7 % — ABNORMAL LOW (ref 39.0–52.0)
Hemoglobin: 8.7 g/dL — ABNORMAL LOW (ref 13.0–17.0)
Immature Granulocytes: 0 %
Lymphocytes Relative: 4 %
Lymphs Abs: 0.4 10*3/uL — ABNORMAL LOW (ref 0.7–4.0)
MCH: 28.6 pg (ref 26.0–34.0)
MCHC: 28.3 g/dL — ABNORMAL LOW (ref 30.0–36.0)
MCV: 101 fL — ABNORMAL HIGH (ref 80.0–100.0)
Monocytes Absolute: 1.2 10*3/uL — ABNORMAL HIGH (ref 0.1–1.0)
Monocytes Relative: 11 %
Neutro Abs: 8.8 10*3/uL — ABNORMAL HIGH (ref 1.7–7.7)
Neutrophils Relative %: 85 %
Platelets: 247 10*3/uL (ref 150–400)
RBC: 3.04 MIL/uL — ABNORMAL LOW (ref 4.22–5.81)
RDW: 17.3 % — ABNORMAL HIGH (ref 11.5–15.5)
WBC: 10.5 10*3/uL (ref 4.0–10.5)
nRBC: 0 % (ref 0.0–0.2)

## 2022-08-28 LAB — COMPREHENSIVE METABOLIC PANEL
ALT: 12 U/L (ref 0–44)
AST: 21 U/L (ref 15–41)
Albumin: 3.4 g/dL — ABNORMAL LOW (ref 3.5–5.0)
Alkaline Phosphatase: 89 U/L (ref 38–126)
BUN: 134 mg/dL — ABNORMAL HIGH (ref 8–23)
CO2: <7 mmol/L — ABNORMAL LOW (ref 22–32)
Calcium: 7.8 mg/dL — ABNORMAL LOW (ref 8.9–10.3)
Chloride: 93 mmol/L — ABNORMAL LOW (ref 98–111)
Creatinine, Ser: 9.04 mg/dL — ABNORMAL HIGH (ref 0.61–1.24)
GFR, Estimated: 6 mL/min — ABNORMAL LOW (ref >60)
Glucose, Bld: 1034 mg/dL (ref 70–99)
Potassium: 7.1 mmol/L (ref 3.5–5.1)
Sodium: 128 mmol/L — ABNORMAL LOW (ref 135–145)
Total Bilirubin: 1.5 mg/dL — ABNORMAL HIGH (ref 0.3–1.2)
Total Protein: 7 g/dL (ref 6.5–8.1)

## 2022-08-28 LAB — BLOOD GAS, VENOUS
Acid-base deficit: 25 mmol/L — ABNORMAL HIGH (ref 0.0–2.0)
Bicarbonate: 4.5 mmol/L — ABNORMAL LOW (ref 20.0–28.0)
O2 Saturation: 96.4 %
Patient temperature: 37
pCO2, Ven: 18 mmHg — CL (ref 44–60)
pH, Ven: 7.01 — CL (ref 7.25–7.43)
pO2, Ven: 85 mmHg — ABNORMAL HIGH (ref 32–45)

## 2022-08-28 LAB — GLUCOSE, CAPILLARY
Glucose-Capillary: 528 mg/dL (ref 70–99)
Glucose-Capillary: >600 mg/dL (ref 70–99)
Glucose-Capillary: >600 mg/dL (ref 70–99)
Glucose-Capillary: >600 mg/dL (ref 70–99)
Glucose-Capillary: >600 mg/dL (ref 70–99)
Glucose-Capillary: >600 mg/dL (ref 70–99)
Glucose-Capillary: >600 mg/dL (ref 70–99)

## 2022-08-28 LAB — RENAL FUNCTION PANEL
Albumin: 3.6 g/dL (ref 3.5–5.0)
BUN: 135 mg/dL — ABNORMAL HIGH (ref 8–23)
CO2: <7 mmol/L — ABNORMAL LOW (ref 22–32)
Calcium: 8 mg/dL — ABNORMAL LOW (ref 8.9–10.3)
Chloride: 94 mmol/L — ABNORMAL LOW (ref 98–111)
Creatinine, Ser: 9.25 mg/dL — ABNORMAL HIGH (ref 0.61–1.24)
GFR, Estimated: 6 mL/min — ABNORMAL LOW (ref >60)
Glucose, Bld: 1015 mg/dL (ref 70–99)
Phosphorus: 9.4 mg/dL — ABNORMAL HIGH (ref 2.5–4.6)
Potassium: 6.1 mmol/L — ABNORMAL HIGH (ref 3.5–5.1)
Sodium: 129 mmol/L — ABNORMAL LOW (ref 135–145)

## 2022-08-28 LAB — PROTIME-INR
INR: 1.8 — ABNORMAL HIGH (ref 0.8–1.2)
Prothrombin Time: 21.3 seconds — ABNORMAL HIGH (ref 11.4–15.2)

## 2022-08-28 LAB — CBG MONITORING, ED
Glucose-Capillary: >600 mg/dL (ref 70–99)
Glucose-Capillary: >600 mg/dL (ref 70–99)

## 2022-08-28 LAB — RESP PANEL BY RT-PCR (RSV, FLU A&B, COVID)  RVPGX2
Influenza A by PCR: NEGATIVE
Influenza B by PCR: NEGATIVE
Resp Syncytial Virus by PCR: NEGATIVE
SARS Coronavirus 2 by RT PCR: NEGATIVE

## 2022-08-28 LAB — MRSA NEXT GEN BY PCR, NASAL: MRSA by PCR Next Gen: NOT DETECTED

## 2022-08-28 LAB — T4, FREE: Free T4: 0.7 ng/dL (ref 0.61–1.12)

## 2022-08-28 LAB — BETA-HYDROXYBUTYRIC ACID: Beta-Hydroxybutyric Acid: >8 mmol/L — ABNORMAL HIGH (ref 0.05–0.27)

## 2022-08-28 LAB — PROCALCITONIN: Procalcitonin: 1.19 ng/mL

## 2022-08-28 LAB — TSH: TSH: 1.323 u[IU]/mL (ref 0.350–4.500)

## 2022-08-28 MED ORDER — SODIUM CHLORIDE 0.9 % IV SOLN
250.0000 mL | INTRAVENOUS | Status: DC | PRN
  Administered 2022-08-29: 100 mL via INTRAVENOUS

## 2022-08-28 MED ORDER — INSULIN REGULAR(HUMAN) IN NACL 100-0.9 UT/100ML-% IV SOLN
INTRAVENOUS | Status: DC
  Administered 2022-08-28 – 2022-08-29 (×2): 8 [IU]/h via INTRAVENOUS
  Filled 2022-08-28 (×2): qty 100

## 2022-08-28 MED ORDER — VANCOMYCIN HCL 750 MG/150ML IV SOLN
750.0000 mg | INTRAVENOUS | Status: DC
  Filled 2022-08-28: qty 150

## 2022-08-28 MED ORDER — DEXTROSE 50 % IV SOLN: 0.0000 mL | INTRAVENOUS | Status: DC | PRN

## 2022-08-28 MED ORDER — DEXTROSE IN LACTATED RINGERS 5 % IV SOLN: INTRAVENOUS | Status: DC

## 2022-08-28 MED ORDER — SODIUM CHLORIDE 0.9% FLUSH
3.0000 mL | Freq: Two times a day (BID) | INTRAVENOUS | Status: DC
  Administered 2022-08-28 – 2022-08-31 (×5): 3 mL via INTRAVENOUS

## 2022-08-28 MED ORDER — ONDANSETRON HCL 4 MG/2ML IJ SOLN: 4.0000 mg | Freq: Four times a day (QID) | INTRAMUSCULAR | Status: DC | PRN

## 2022-08-28 MED ORDER — LACTATED RINGERS IV SOLN: INTRAVENOUS | Status: DC

## 2022-08-28 MED ORDER — HEPARIN SODIUM (PORCINE) 5000 UNIT/ML IJ SOLN
5000.0000 [IU] | Freq: Three times a day (TID) | INTRAMUSCULAR | Status: DC
  Administered 2022-08-28: 5000 [IU] via SUBCUTANEOUS
  Filled 2022-08-28: qty 1

## 2022-08-28 MED ORDER — POLYETHYLENE GLYCOL 3350 17 G PO PACK: 17.0000 g | PACK | Freq: Every day | ORAL | Status: DC | PRN

## 2022-08-28 MED ORDER — SODIUM CHLORIDE 0.9% FLUSH: 3.0000 mL | INTRAVENOUS | Status: DC | PRN

## 2022-08-28 MED ORDER — DOCUSATE SODIUM 100 MG PO CAPS: 100.0000 mg | ORAL_CAPSULE | Freq: Two times a day (BID) | ORAL | Status: DC | PRN

## 2022-08-28 MED ORDER — ACETAMINOPHEN 325 MG PO TABS: 650.0000 mg | ORAL_TABLET | ORAL | Status: DC | PRN

## 2022-08-28 MED ORDER — SODIUM CHLORIDE 0.9 % IV SOLN
1.0000 g | INTRAVENOUS | Status: DC
  Administered 2022-08-28 – 2022-08-29 (×2): 1 g via INTRAVENOUS
  Filled 2022-08-28 (×2): qty 10

## 2022-08-28 MED ORDER — CHLORHEXIDINE GLUCONATE CLOTH 2 % EX PADS
6.0000 | MEDICATED_PAD | Freq: Every day | CUTANEOUS | Status: DC
  Administered 2022-08-29 – 2022-08-31 (×4): 6 via TOPICAL

## 2022-08-28 MED ORDER — LACTATED RINGERS IV BOLUS
500.0000 mL | Freq: Once | INTRAVENOUS | Status: AC
  Administered 2022-08-28: 500 mL via INTRAVENOUS

## 2022-08-28 MED ORDER — POTASSIUM CHLORIDE 10 MEQ/100ML IV SOLN: 10.0000 meq | INTRAVENOUS | Status: DC

## 2022-08-28 MED ORDER — HEPARIN SODIUM (PORCINE) 1000 UNIT/ML DIALYSIS
1000.0000 [IU] | INTRAMUSCULAR | Status: DC | PRN
  Administered 2022-08-29: 3600 [IU]
  Filled 2022-08-28 (×4): qty 1

## 2022-08-28 MED ORDER — ONDANSETRON HCL 4 MG/2ML IJ SOLN
4.0000 mg | Freq: Once | INTRAMUSCULAR | Status: AC
  Administered 2022-08-28: 4 mg via INTRAVENOUS
  Filled 2022-08-28: qty 2

## 2022-08-28 MED ORDER — VANCOMYCIN HCL 1500 MG/300ML IV SOLN
1500.0000 mg | Freq: Once | INTRAVENOUS | Status: AC
  Administered 2022-08-29: 1500 mg via INTRAVENOUS
  Filled 2022-08-28: qty 300

## 2022-08-28 NOTE — Progress Notes (Signed)
Pharmacy Antibiotic Note  MCHENRY LABA is a 67 y.o. male admitted on 08/28/2022 with sepsis.  Pharmacy has been consulted for Vancomycin, Cefepime dosing.  Pt is on HD every MWF.   Plan: Cefepime 1 gm IV Q24H to start 8/24 @ 2300.  Vancomycin 1500 mg IV X 1 ordered for 8/24 at loading dose. Vancomycin 750 mg IV Q MWF - HD to start on 8/26.   Height: 5\' 11"  (180.3 cm) Weight: 63.5 kg (140 lb) IBW/kg (Calculated) : 75.3  Temp (24hrs), Avg:97.7 F (36.5 C), Min:95.8 F (35.4 C), Max:99.5 F (37.5 C)  Recent Labs  Lab 08/28/22 1634 08/28/22 2011 08/28/22 2012  WBC 10.5 9.9 9.7  CREATININE 9.04* 9.25*  --   LATICACIDVEN 2.0* 1.6  --     Estimated Creatinine Clearance: 7.1 mL/min (A) (by C-G formula based on SCr of 9.25 mg/dL (H)).    Allergies  Allergen Reactions   Empagliflozin     Other Reaction(s): Acidosis   Pravastatin     Other reaction(s): Muscle pain   Simvastatin     Other reaction(s): Muscle pain    Antimicrobials this admission:   >>    >>   Dose adjustments this admission:   Microbiology results:  BCx:   UCx:    Sputum:    MRSA PCR:   Thank you for allowing pharmacy to be a part of this patient's care.  Savahanna Almendariz D 08/28/2022 11:15 PM

## 2022-08-28 NOTE — Progress Notes (Signed)
[facility]  ROUNDING NOTE   Subjective:   [name] was admitted to [facility] on 08/28/2022 for DKA (diabetic ketoacidosis) ([name]) [E11.10]  Last hemodialysis treatment was Wednesday, August 21. He left at 65kg, 1kg above his dry weight.   Patient presents with nausea, vomiting, diarrhea with dark stools. He has not been eating and not taking his medications. Patient now with abdominal pain and weakness. Nephrology consulted for hyperkalemia and metabolic acidosis.   Objective:  Vital signs in last 24 hours:  Temp:  [99.5 F (37.5 C)] 99.5 F (37.5 C) (08/24 1626) Pulse Rate:  [65] 65 (08/24 1626) Resp:  [21] 21 (08/24 1626) BP: (124)/(61) 124/61 (08/24 1626) SpO2:  [98 %-100 %] 100 % (08/24 1626) Weight:  [63.5 kg] 63.5 kg (08/24 1627)  Weight change:  Filed Weights   08/28/22 1627  Weight: 63.5 kg    Intake/Output: I/O last 3 completed shifts: In: 500 [IV Piggyback:500] Out: -    Intake/Output this shift:  No intake/output data recorded.  Physical Exam: General: NAD, ill appearing  Head: Normocephalic, atraumatic. Moist oral mucosal membranes  Eyes: Anicteric, PERRL  Neck: Supple, trachea midline  Lungs:  Clear to auscultation  Heart: Regular rate and rhythm  Abdomen:  Soft, nontender,   Extremities:  no peripheral edema.  Neurologic: Nonfocal, moving all four extremities  Skin: No lesions  Access: RIJ permcath    Basic Metabolic Panel: Recent Labs  Lab 08/28/22 1634  NA 128*  K 7.1*  CL 93*  CO2 <7*  GLUCOSE 1,034*  BUN 134*  CREATININE 9.04*  CALCIUM 7.8*    Liver Function Tests: Recent Labs  Lab 08/28/22 1634  AST 21  ALT 12  ALKPHOS 89  BILITOT 1.5*  PROT 7.0  ALBUMIN 3.4*   No results for input(s): "LIPASE", "AMYLASE" in the last 168 hours. No results for input(s): "AMMONIA" in the last 168 hours.  CBC: Recent Labs  Lab 08/28/22 1634  WBC 10.5  NEUTROABS 8.8*  HGB 8.7*  HCT 30.7*  MCV 101.0*  PLT 247     Cardiac Enzymes: No results for input(s): "CKTOTAL", "CKMB", "CKMBINDEX", "TROPONINI" in the last 168 hours.  BNP: Invalid input(s): "POCBNP"  CBG: Recent Labs  Lab 08/28/22 1627 08/28/22 1832  GLUCAP >600* >600*    Microbiology: Results for orders placed or performed during the hospital encounter of 08/13/21  C Difficile Quick Screen w PCR reflex     Status: Abnormal   Collection Time: 08/13/21  8:10 PM   Specimen: STOOL  Result Value Ref Range Status   C Diff antigen POSITIVE (A) NEGATIVE Final   C Diff toxin NEGATIVE NEGATIVE Final   C Diff interpretation Results are indeterminate. See PCR results.  Final    Comment: Performed at [facility], [address] [place], [place] [address]  C. Diff by PCR, Reflexed     Status: Abnormal   Collection Time: 08/13/21  8:10 PM  Result Value Ref Range Status   Toxigenic C. Difficile by PCR POSITIVE (A) NEGATIVE Final    Comment: Positive for toxigenic C. difficile with little to no toxin production. Only treat if clinical presentation suggests symptomatic illness. Performed at [facility], [address] [place], [place] [address]   Gastrointestinal Panel by PCR , Stool     Status: None   Collection Time: 08/13/21  8:18 PM   Specimen: Stool  Result Value Ref Range Status   Campylobacter species NOT DETECTED NOT DETECTED Final  Plesimonas shigelloides NOT DETECTED NOT DETECTED Final   Salmonella species NOT DETECTED NOT DETECTED Final   Yersinia enterocolitica NOT DETECTED NOT DETECTED Final   Vibrio species NOT DETECTED NOT DETECTED Final   Vibrio cholerae NOT DETECTED NOT DETECTED Final   Enteroaggregative E coli (EAEC) NOT DETECTED NOT DETECTED Final   Enteropathogenic E coli (EPEC) NOT DETECTED NOT DETECTED Final   Enterotoxigenic E coli (ETEC) NOT DETECTED NOT DETECTED Final   Shiga like toxin producing E coli (STEC) NOT DETECTED NOT DETECTED Final   Shigella/Enteroinvasive E coli (EIEC) NOT  DETECTED NOT DETECTED Final   Cryptosporidium NOT DETECTED NOT DETECTED Final   Cyclospora cayetanensis NOT DETECTED NOT DETECTED Final   Entamoeba histolytica NOT DETECTED NOT DETECTED Final   Giardia lamblia NOT DETECTED NOT DETECTED Final   Adenovirus F40/41 NOT DETECTED NOT DETECTED Final   Astrovirus NOT DETECTED NOT DETECTED Final   Norovirus GI/GII NOT DETECTED NOT DETECTED Final   Rotavirus A NOT DETECTED NOT DETECTED Final   Sapovirus (I, II, IV, and V) NOT DETECTED NOT DETECTED Final    Comment: Performed at [facility], [address] [place], [place] [address]    Coagulation Studies: Recent Labs    08/28/22 1634  LABPROT 21.3*  INR 1.8*    Urinalysis: No results for input(s): "COLORURINE", "LABSPEC", "PHURINE", "GLUCOSEU", "HGBUR", "BILIRUBINUR", "KETONESUR", "PROTEINUR", "UROBILINOGEN", "NITRITE", "LEUKOCYTESUR" in the last 72 hours.  Invalid input(s): "APPERANCEUR"    Imaging: CT ABDOMEN PELVIS WO CONTRAST  Result Date: 08/28/2022 CLINICAL DATA:  Lower abdominal pain. Nausea, vomiting, and diarrhea for 2 days. Dark stool. EXAM: CT ABDOMEN AND PELVIS WITHOUT CONTRAST TECHNIQUE: Multidetector CT imaging of the abdomen and pelvis was performed following the standard protocol without IV contrast. RADIATION DOSE REDUCTION: This exam was performed according to the departmental dose-optimization program which includes automated exposure control, adjustment of the mA and/or kV according to patient size and/or use of iterative reconstruction technique. COMPARISON:  08/13/2021 FINDINGS: Lower chest: Small right pleural effusion. Hepatobiliary: No mass visualized on this unenhanced exam. Contrast noted in the gallbladder and common bile duct. No evidence of cholecystitis. Pancreas: Limited visualization due to peripancreatic fluid and lack of intravenous contrast. Spleen:  Within normal limits in size. Adrenals/Urinary tract: Residual contrast enhancement of both kidneys  seen from previous IV contrast administration. No No evidence of mass or hydronephrosis. Small amount of contrast noted in urinary bladder. Stomach/Bowel: Limited visualization due to large amount of ascites. No evidence of dilated bowel loops. Vascular/Lymphatic: Limited by large amount of ascites. No definite lymphadenopathy identified. No evidence of abdominal aortic aneurysm. Reproductive:  Unremarkable. Other: Small bilateral inguinal hernias which contain ascites. Diffuse body wall edema. Musculoskeletal:  No suspicious bone lesions identified. IMPRESSION: Large amount of ascites, and anasarca. Ascites and lack of IV contrast limits visualization of the internal organs. Small right pleural effusion. Small bilateral inguinal hernias, which contain ascites. Electronically Signed   By: Danae Orleans M.D.   On: 08/28/2022 17:55   DG Chest Port 1 View  Result Date: 08/28/2022 CLINICAL DATA:  Nausea and vomiting and diarrhea for 2 days. Dark stools. Patient is on dialysis EXAM: PORTABLE CHEST 1 VIEW semi upright COMPARISON:  X-ray 08/13/2021 FINDINGS: Enlarged cardiopericardial silhouette. Vascular congestion. No edema. No consolidation or effusion. Double-lumen large-bore right IJ catheter seen with tip along the right atrium. Overlapping cardiac leads. IMPRESSION: Right IJ line. Enlarged heart with some central vascular congestion. Electronically Signed   By: Piedad Climes.D.  On: 08/28/2022 17:15     Medications:    sodium chloride     dextrose 5% lactated ringers     insulin 8 Units/hr (08/28/22 1803)   lactated ringers 125 mL/hr at 08/28/22 1804    [START ON 08/29/2022] Chlorhexidine Gluconate Cloth  6 each Topical Q0600   heparin  5,000 Units Subcutaneous Q8H   sodium chloride flush  3 mL Intravenous Q12H   sodium chloride, acetaminophen, dextrose, docusate sodium, ondansetron (ZOFRAN) IV, polyethylene glycol, sodium chloride flush  Assessment/ Plan:  [name] is a 67 y.o.   male with end stage renal disease on hemodialysis, hypertension, diabetes mellitus type II, congestive heart failure, coronary artery disease, CVA, alcohol abuse, tobacco abuse and hypothyroidism who presents to [facility] on 08/28/2022 for DKA (diabetic ketoacidosis) ([name]) [E11.10] Found to have hyperkalemia and DKA.   [facility] MWF [facility] RIJ permcath [phone]  End Stage Renal Disease with hyperkalemia and metabolic acidosis - will need emergent hemodialysis tonight. Orders prepared. No ultrafiltration.   Hypotension:  - continue volume resuscitation. - hold blood pressure medications.    Diabetes mellitus type II with chronic kidney disease: admitted with DKA.  - DKA protocol - insulin gtt - IV fluids  Anemia with chronic kidney disease: macrocytic - ESA with MWF scheduled HD treatments.    LOS: 0 Makaelah Cranfield 8/24/20247:07 PM

## 2022-08-28 NOTE — Progress Notes (Signed)
eLink Physician-Brief Progress Note Patient Name: Shawn [name] DOB: [birth year] MRN: [id number]   Date of Service  08/28/2022  HPI/Events of Note  Nephrology note reviewed. ER and CCM notes pending at this time. ESRD patient now with severe metabolic acidosis, hyperkalemia, hyperglycemia. Stable vitals at this time in no distress on camera.   eICU Interventions  HD per renal team Volume resuscitation as guided by volume status and IV insulin per protocol Will need serial labs and glucose monitoring Call E link if needed     Intervention Category Major Interventions: Electrolyte abnormality - evaluation and management;Hyperglycemia - active titration of insulin therapy Evaluation Type: New Patient Evaluation  Oretha Milch 08/28/2022, 8:34 PM

## 2022-08-28 NOTE — ED Triage Notes (Signed)
Pt BIBA from home. Pt reports N/V/D for 2 days. Pt reports that they are dark stools. Pt has dialysis and was done yesterday. Pt diabetic  but hasn't taken insulin due to not being able to eat due to nausea. EMS CBG said "HI"

## 2022-08-28 NOTE — H&P (Signed)
NAME:  Shawn [name], MRN:  [id number], DOB:  [birth year], LOS: 0 ADMISSION DATE:  08/28/2022, CONSULTATION DATE:  08/28/22 REFERRING MD:  Claudell Kyle CHIEF COMPLAINT: Nausea vomiting and melena   HPI  67 y.o male with significant PMH of chronic HFrEF due to reported NICM, hypothyroidism, EtOH abuse, tobacco abuse, T2DM, HTN, CVA, ESRD on HD MWF, and anemia of chronic disease who presented to the ED with chief complaints of nausea, vomiting, diarrhea with dark stools x 2 days.  Patient states for the past week he has had worsening nausea, vomiting, and diarrhea associated with poor p.o. intake. He admits to not taking his insulin has prescribed due to poor po intake.  He had his last hemodialysis (HD) session yesterday before the presentation.    ED Course: Initial vital signs showed HR of 65 beats/minute, BP 124/61 mm Hg, the RR 21 breaths/minute, and the oxygen saturation 100% on RA and a temperature of 98.51F (37.5C). Pertinent Labs/Diagnostics Findings: Na+/ K+:128/7.1  Glucose: 1034 BUN/Cr.:134/9.04 Calcium: 7.8, CO2:<7, Anion Gap high WBC:10 Hgb/Hct:8.7/30.7  PCT: negative <0.10  Beta Hydroxy:>8.00 Lactic acid: 2.0 COVID PCR: pending  VBG: pO2 85; pCO2 18; pH 7.01;  HCO3 4.5, %O2 Sat 96.4.  CXR> CT Abd/pelvis>see below  The laboratory data consistent with severe DKA. The management was initiated with initial intravenous fluid, followed by intravenous insulin infusion as per DKA protocol. A chest x-ray showed marked fluid overload, therefore no additional crystalloid was administered. Serum electrolytes were closely monitored. PCCM consulted to admit for further management of DKA.  Past Medical History  chronic HFrEF due to reported NICM, hypothyroidism, EtOH abuse, tobacco abuse, T2DM, HTN, CVA, ESRD on HD MWF, and anemia of chronic disease  Significant Hospital Events   08/28/22: Admit to ICU with severe DKA  Consults:  Nephrology  Procedures:  None  Significant  Diagnostic Tests:  8/24: Chest Xray> 8/24: CTA Chest, abdomen and pelvis>  Interim History / Subjective:      Micro Data:  8/24: SARS-CoV-2 PCR> negative 8/24: Influenza PCR> negative 8/24: Blood culture x2> 8/24: MRSA PCR>>   Antimicrobials:  Vancomycin 8/24> Cefepime 8/24>  OBJECTIVE  Blood pressure 137/65, pulse 67, temperature 99.5 F (37.5 C), temperature source Rectal, resp. rate (!) 22, height 5\' 11"  (1.803 m), weight 63.5 kg, SpO2 100%.        Intake/Output Summary (Last 24 hours) at 08/28/2022 2006 Last data filed at 08/28/2022 1804 Gross per 24 hour  Intake 500 ml  Output --  Net 500 ml   Filed Weights   08/28/22 1627  Weight: 63.5 kg   Physical Examination  GENERAL: 67 year-old critically ill patient lying in the bed  EYES: PEERLA. No scleral icterus. Extraocular muscles intact.  HEENT: Head atraumatic, normocephalic. Oropharynx and nasopharynx clear.  NECK:  No JVD, supple  LUNGS: decreased breath sounds bilaterally.  No use of accessory muscles of respiration.  CARDIOVASCULAR: S1, S2 normal. No murmurs, rubs, or gallops.  ABDOMEN: Distended, anasarca  EXTREMITIES: No swelling or erythema.  Capillary refill < 3 seconds in all extremities. Pulses palpable distally. NEUROLOGIC: The patient is alert and oriented x 3. No focal neurological deficit appreciated. Cranial nerves are intact.  SKIN: No obvious rash, multiple skin lesion, and healing ulcer. Between right fingers. skin cool to touch Labs/imaging that I havepersonally reviewed  (right click and "Reselect all SmartList Selections" daily)     Labs   CBC: Recent Labs  Lab 08/28/22 1634  WBC 10.5  NEUTROABS 8.8*  HGB 8.7*  HCT 30.7*  MCV 101.0*  PLT 247    Basic Metabolic Panel: Recent Labs  Lab 08/28/22 1634  NA 128*  K 7.1*  CL 93*  CO2 <7*  GLUCOSE 1,034*  BUN 134*  CREATININE 9.04*  CALCIUM 7.8*   GFR: Estimated Creatinine Clearance: 7.2 mL/min (A) (by C-G formula based on SCr  of 9.04 mg/dL (H)). Recent Labs  Lab 08/28/22 1634  WBC 10.5  LATICACIDVEN 2.0*    Liver Function Tests: Recent Labs  Lab 08/28/22 1634  AST 21  ALT 12  ALKPHOS 89  BILITOT 1.5*  PROT 7.0  ALBUMIN 3.4*   No results for input(s): "LIPASE", "AMYLASE" in the last 168 hours. No results for input(s): "AMMONIA" in the last 168 hours.  ABG    Component Value Date/Time   HCO3 4.5 (L) 08/28/2022 1645   ACIDBASEDEF 25.0 (H) 08/28/2022 1645   O2SAT 96.4 08/28/2022 1645     Coagulation Profile: Recent Labs  Lab 08/28/22 1634  INR 1.8*    Cardiac Enzymes: No results for input(s): "CKTOTAL", "CKMB", "CKMBINDEX", "TROPONINI" in the last 168 hours.  HbA1C: Hemoglobin A1C  Date/Time Value Ref Range Status  06/18/2013 04:27 AM 11.9 (H) 4.2 - 6.3 % Final    Comment:    The American Diabetes Association recommends that a primary goal of therapy should be <7% and that physicians should reevaluate the treatment regimen in patients with HbA1c values consistently >8%.    Hgb A1c MFr Bld  Date/Time Value Ref Range Status  06/24/2021 05:26 AM 11.9 (H) 4.8 - 5.6 % Final    Comment:    (NOTE) Pre diabetes:          5.7%-6.4%  Diabetes:              >6.4%  Glycemic control for   <7.0% adults with diabetes   05/11/2021 05:02 PM 12.7 (H) 4.8 - 5.6 % Final    Comment:    (NOTE)         Prediabetes: 5.7 - 6.4         Diabetes: >6.4         Glycemic control for adults with diabetes: <7.0     CBG: Recent Labs  Lab 08/28/22 1627 08/28/22 1832 08/28/22 1952  GLUCAP >600* >600* >600*    Review of Systems:   Unable to obtain patient is lethargic and not coherent due to acute illness  Past Medical History  He,  has a past medical history of Chronic combined systolic (congestive) and diastolic (congestive) heart failure ([name]), CKD (chronic kidney disease), stage II, Diabetes mellitus with complication ([name]), Essential hypertension, NICM (nonischemic cardiomyopathy) ([name]),  Polysubstance abuse ([name]), and Stroke ([name]).   Surgical History    Past Surgical History:  Procedure Laterality Date   CENTRAL LINE INSERTION N/A 06/29/2021   Procedure: CENTRAL LINE INSERTION;  Surgeon: Yvonne Kendall, MD;  Location: [facility] INVASIVE CV LAB;  Service: Cardiovascular;  Laterality: N/A;   NO PAST SURGERIES     RIGHT HEART CATH N/A 06/29/2021   Procedure: RIGHT HEART CATH;  Surgeon: Yvonne Kendall, MD;  Location: [facility] INVASIVE CV LAB;  Service: Cardiovascular;  Laterality: N/A;     Social History   reports that he has been smoking cigarettes. He has never used smokeless tobacco. He reports that he does not currently use alcohol. He reports that he does not use drugs.   Family History   His family history includes Congestive Heart Failure in his brother  and mother; Diabetes in his mother.   Allergies Allergies  Allergen Reactions   Empagliflozin     Other Reaction(s): Acidosis   Pravastatin     Other reaction(s): Muscle pain   Simvastatin     Other reaction(s): Muscle pain     Home Medications  Prior to Admission medications   Medication Sig Start Date End Date Taking? Authorizing Provider  acetaminophen (TYLENOL) 325 MG tablet Take 2 tablets (650 mg total) by mouth every 6 (six) hours as needed for mild pain (or Fever >/= 101). 01/09/21  Yes Ghimire, Lyndel Safe, MD  ammonium lactate (LAC-HYDRIN) 12 % lotion Apply 1 Application topically as needed. 01/20/22  Yes [provider]  aspirin EC 81 MG tablet Take 81 mg by mouth daily. 06/18/22  Yes [provider]  atorvastatin (LIPITOR) 80 MG tablet Take 40 mg by mouth at bedtime. 01/20/22  Yes [provider]  cholecalciferol (VITAMIN D) 25 MCG (1000 UNIT) tablet Take 2,000 Units by mouth daily. Mon   Yes [provider]  dextrose (GLUTOSE) 40 % GEL Take 1 Tube by mouth once as needed. 01/20/22  Yes [provider]  hydrOXYzine (ATARAX) 10 MG tablet Take 10 mg by mouth 4 (four) times  daily - after meals and at bedtime. 02/18/21  Yes [provider]  insulin aspart (NOVOLOG FLEXPEN) 100 UNIT/ML FlexPen Inject 6 Units into the skin 3 (three) times daily with meals. 03/25/21  Yes [provider]  insulin glargine-yfgn (SEMGLEE) 100 UNIT/ML Pen Inject 20 Units into the skin daily. Patient taking differently: Inject 20 Units into the skin at bedtime. 05/14/21  Yes Darlin Priestly, MD  levothyroxine (SYNTHROID) 75 MCG tablet Take 75 mcg by mouth daily before breakfast. 03/24/22  Yes [provider]  Multiple Vitamin (MULTIVITAMIN WITH MINERALS) TABS tablet Take 1 tablet by mouth daily. 01/10/21  Yes Dorcas Carrow, MD  omeprazole (PRILOSEC) 20 MG capsule Take 20 mg by mouth daily. 12/17/21  Yes [provider]  polyethylene glycol (MIRALAX / GLYCOLAX) 17 g packet Take 17 g by mouth daily.   Yes [provider]  torsemide (DEMADEX) 20 MG tablet Take 60 mg by mouth as directed. TAKE THREE TABLETS BY MOUTH SUNDAY,TUESDAY,THURSDAY,SATURDAY FOR ACCUMULATION OF FLUID 03/24/22  Yes [provider]  carvedilol (COREG) 12.5 MG tablet Take 12.5 mg by mouth 2 (two) times daily with a meal. Patient not taking: Reported on 08/28/2022 03/24/22   [provider]  feeding supplement, GLUCERNA SHAKE, (GLUCERNA SHAKE) LIQD Take 237 mLs by mouth 3 (three) times daily between meals. 01/09/21   Dorcas Carrow, MD  nicotine (NICODERM CQ - DOSED IN MG/24 HOURS) 21 mg/24hr patch Place 1 patch (21 mg total) onto the skin daily. Patient not taking: Reported on 08/28/2022 07/07/21   Arnetha Courser, MD  Scheduled Meds:  Melene Muller ON 08/29/2022] Chlorhexidine Gluconate Cloth  6 each Topical Q0600   heparin  5,000 Units Subcutaneous Q8H   sodium chloride flush  3 mL Intravenous Q12H   Continuous Infusions:  sodium chloride     dextrose 5% lactated ringers     insulin 8 Units/hr (08/28/22 1803)   lactated ringers 125 mL/hr at 08/28/22 1804   PRN Meds:.sodium chloride,  acetaminophen, dextrose, docusate sodium, ondansetron (ZOFRAN) IV, polyethylene glycol, sodium chloride flush  Active Hospital Problem list   See systems below  Assessment & Plan:  #Diabetic Ketoacidosis ; unclear precipitating cause, also missed insulin due to anorexia Hx T2DM -CT abd/pelvis and CXR wit no obvious  source, UA, Blood Cultures for infection pending -Troponin, EKG for ischemia -Lipase for pancreatitis -Received Insulin (regular) 0.1u/kg (~10 units) IV x1 in the ED -Continue Insulin drip, DKA protocol -Glucose: q1h to titrate insulin -Lab monitoring q4h BMP+Phosphorus+mag -Diabetes coordinator consult    #ESRD on HD MWF: Last HD on 8/23 #Hyponatremia- likely pseudohyponatremia in the setting of DKA correct for hyperglycemia #Hyperkalemia corrected with HD #AGMA with Lactic Acidosis- correct with HD -Follow BMP -Ensure adequate renal perfusion -Replace electrolytes as indicated -Urgent HD for severe metabolic acidosis -Nephro following   #Suspected  Sepsis of unknown Source meets SIRS criteria: Heart Rate 102 beats/minute, Respiratory Rate 36 breaths/minute,Temperature 104.6, -Supplemental oxygen as needed, to maintain SpO2 > 90% -F/u cultures, trend lactic/ PCT -Monitor WBC/ fever curve -IV antibiotics: cefepime & vancomycin  -Pressors for MAP goal >65 -Strict I/O's   #Acute on chronic HFrEF (severe LV dysfunction with EF of 25% 06/26/21) #Hypertension Hx: Severe nonischemic cardiomyopathy, HLD  -Continuous cardiac monitoring -Maintain MAP greater than 65 -HD for volume removal -Hold coreg and demadex -Continue Atorvastatin once able to take po -Cardiology consult -Repeat 2D Echocardiogram   #Acute on chronic anemia  Hgb on admission 8.7 -Patient reporting melena -Monitor for S/Sx of bleeding -Trend CBC (H&H q6h) -SCD's for VTE Prophylaxis (chemical ppx contraindicated) -Transfuse for Hgb <7  #Hypothyroidism -Check TSH and free T4 and cortisol  level -Continue Synthroid  #Tobacco use disorder Patient currently smoking half a pack PPD -Smoking cessation -Nicotine patch  Best practice:  Diet:  Oral Pain/Anxiety/Delirium protocol (if indicated): No VAP protocol (if indicated): Not indicated DVT prophylaxis: Contraindicated GI prophylaxis: N/A Glucose control:  Insulin gtt Central venous access:  Yes, and it is still needed Arterial line:  N/A Foley:  N/A Mobility:  bed rest  PT consulted: N/A Last date of multidisciplinary goals of care discussion [8/25] Code Status:  full code Disposition: ICU   = Goals of Care = Code Status Order: FULL  Primary Emergency Contact: WATLINGTONFRANKS,[name] Wishes to pursue full aggressive treatment and intervention options, including CPR and intubation,    Critical care time: 45 minutes        Webb Silversmith DNP, CCRN, FNP-C, AGACNP-BC Acute Care & Family Nurse Practitioner [employer] [facility] PCCM on call pager [phone]

## 2022-08-28 NOTE — Consult Note (Signed)
PHARMACY CONSULT NOTE - ELECTROLYTES  Pharmacy Consult for Electrolyte Monitoring and Replacement   Recent Labs: Height: 5\' 11"  (180.3 cm) Weight: 63.5 kg (140 lb) IBW/kg (Calculated) : 75.3 Estimated Creatinine Clearance: 7.2 mL/min (A) (by C-G formula based on SCr of 9.04 mg/dL (H)). Potassium (mmol/L)  Date Value  08/28/2022 7.1 (HH)  06/20/2013 4.3   Magnesium (mg/dL)  Date Value  16/10/9602 2.2  06/20/2013 1.5 (L)   Calcium (mg/dL)  Date Value  54/09/8117 7.8 (L)   Calcium, Total (mg/dL)  Date Value  14/78/2956 9.0   Albumin (g/dL)  Date Value  21/30/8657 3.4 (L)  07/14/2017 4.0  06/17/2013 3.2 (L)   Phosphorus (mg/dL)  Date Value  84/69/6295 1.5 (L)   Sodium (mmol/L)  Date Value  08/28/2022 128 (L)  07/14/2017 136  06/20/2013 135 (L)   Assessment  Shawn [name] is a 67 y.o. male presenting with DKA. PMH significant for HTN, Ascites, ESRD, hypothyroidism, HLD, DM, and . Pharmacy has been consulted to monitor and replace electrolytes.  Diet: npo MIVF: LR @ 125 mL/hr Pertinent medications: insulin gtt  Goal of Therapy: Electrolytes WNL  Plan:  Nephorlogy consulted. Admitetd wioth clitical K level > 7. No replacement indicated at this time. Will follow K every 4 hrs per insulin gtt prtocol as replace as needed. Check BMP, Mg, Phos with AM labs  Thank you for allowing pharmacy to be a part of this patient's care. Taylour Lietzke Rodriguez-Guzman PharmD, BCPS 08/28/2022 7:32 PM

## 2022-08-28 NOTE — ED Provider Notes (Signed)
[facility] Provider Note    Event Date/Time   First MD Initiated Contact with Patient 08/28/22 1623     (approximate)   History   Nausea and Melena   HPI Shawn [name] is a 67 y.o. male with DM2, nonischemic cardiomyopathy, HFrEF, HTN, ESRD on dialysis (MWF) who presents today for nausea, vomiting, and melena.  Patient states for the past week he has had worsening nausea, vomiting, and diarrhea associated with poor p.o. intake.  He has gotten weaker over the past couple days and admits that he has not taken his insulin as scheduled for the past several days because of this.  He notes some vague generalized abdominal pain in the lower abdomen.  No specific chest pain or difficulty breathing.  Noticed 1 episode of melena but no other bright red blood per rectum.  States he did go to his dialysis session yesterday.     Physical Exam   Triage Vital Signs: ED Triage Vitals  Encounter Vitals Group     BP 08/28/22 1626 124/61     Systolic BP Percentile --      Diastolic BP Percentile --      Pulse Rate 08/28/22 1626 65     Resp 08/28/22 1626 (!) 21     Temp 08/28/22 1626 99.5 F (37.5 C)     Temp Source 08/28/22 1626 Rectal     SpO2 08/28/22 1623 98 %     Weight 08/28/22 1627 140 lb (63.5 kg)     Height 08/28/22 1627 5\' 11"  (1.803 m)     Head Circumference --      Peak Flow --      Pain Score 08/28/22 1627 0     Pain Loc --      Pain Education --      Exclude from Growth Chart --     Most recent vital signs: Vitals:   08/28/22 2300 08/28/22 2315  BP: 135/73 120/62  Pulse: 71 73  Resp: 18 (!) 28  Temp:    SpO2: 98% 98%   Physical Exam: I have reviewed the vital signs and nursing notes. General: Awake, alert, very fatigued appearing Head:  Atraumatic, normocephalic.   ENT:  EOM intact, PERRL. Oral mucosa is pink and moist with no lesions. Neck: Neck is supple with full range of motion, No meningeal signs. Cardiovascular:  RRR, No  murmurs. Peripheral pulses palpable and equal bilaterally. Respiratory:  Symmetrical chest wall expansion.  No rhonchi, rales, or wheezes.  Good air movement throughout.  No use of accessory muscles.   Musculoskeletal:  No cyanosis or edema. Moving extremities with full ROM Abdomen:  Soft, generalized tenderness to palpation throughout the abdomen which is mild, nondistended. Neuro:  GCS 15, moving all four extremities, interacting appropriately. Speech clear. Psych:  Calm, appropriate.   Skin:  Warm, dry, no rash.     ED Results / Procedures / Treatments   Labs (all labs ordered are listed, but only abnormal results are displayed) Labs Reviewed  COMPREHENSIVE METABOLIC PANEL - Abnormal; Notable for the following components:      Result Value   Sodium 128 (*)    Potassium 7.1 (*)    Chloride 93 (*)    CO2 <7 (*)    Glucose, Bld 1,034 (*)    BUN 134 (*)    Creatinine, Ser 9.04 (*)    Calcium 7.8 (*)    Albumin 3.4 (*)    Total Bilirubin 1.5 (*)  GFR, Estimated 6 (*)    All other components within normal limits  LACTIC ACID, PLASMA - Abnormal; Notable for the following components:   Lactic Acid, Venous 2.0 (*)    All other components within normal limits  CBC WITH DIFFERENTIAL/PLATELET - Abnormal; Notable for the following components:   RBC 3.04 (*)    Hemoglobin 8.7 (*)    HCT 30.7 (*)    MCV 101.0 (*)    MCHC 28.3 (*)    RDW 17.3 (*)    Neutro Abs 8.8 (*)    Lymphs Abs 0.4 (*)    Monocytes Absolute 1.2 (*)    All other components within normal limits  PROTIME-INR - Abnormal; Notable for the following components:   Prothrombin Time 21.3 (*)    INR 1.8 (*)    All other components within normal limits  BLOOD GAS, VENOUS - Abnormal; Notable for the following components:   pH, Ven 7.01 (*)    pCO2, Ven 18 (*)    pO2, Ven 85 (*)    Bicarbonate 4.5 (*)    Acid-base deficit 25.0 (*)    All other components within normal limits  BETA-HYDROXYBUTYRIC ACID - Abnormal;  Notable for the following components:   Beta-Hydroxybutyric Acid >8.00 (*)    All other components within normal limits  CBC - Abnormal; Notable for the following components:   RBC 2.90 (*)    Hemoglobin 8.3 (*)    HCT 27.8 (*)    MCHC 29.9 (*)    RDW 17.2 (*)    All other components within normal limits  RENAL FUNCTION PANEL - Abnormal; Notable for the following components:   Sodium 129 (*)    Potassium 6.1 (*)    Chloride 94 (*)    CO2 <7 (*)    Glucose, Bld 1,015 (*)    BUN 135 (*)    Creatinine, Ser 9.25 (*)    Calcium 8.0 (*)    Phosphorus 9.4 (*)    GFR, Estimated 6 (*)    All other components within normal limits  CBC - Abnormal; Notable for the following components:   RBC 2.93 (*)    Hemoglobin 8.4 (*)    HCT 29.1 (*)    MCHC 28.9 (*)    RDW 17.2 (*)    All other components within normal limits  GLUCOSE, CAPILLARY - Abnormal; Notable for the following components:   Glucose-Capillary >600 (*)    All other components within normal limits  GLUCOSE, CAPILLARY - Abnormal; Notable for the following components:   Glucose-Capillary >600 (*)    All other components within normal limits  GLUCOSE, CAPILLARY - Abnormal; Notable for the following components:   Glucose-Capillary >600 (*)    All other components within normal limits  GLUCOSE, CAPILLARY - Abnormal; Notable for the following components:   Glucose-Capillary >600 (*)    All other components within normal limits  GLUCOSE, CAPILLARY - Abnormal; Notable for the following components:   Glucose-Capillary >600 (*)    All other components within normal limits  CBG MONITORING, ED - Abnormal; Notable for the following components:   Glucose-Capillary >600 (*)    All other components within normal limits  CBG MONITORING, ED - Abnormal; Notable for the following components:   Glucose-Capillary >600 (*)    All other components within normal limits  RESP PANEL BY RT-PCR (RSV, FLU A&B, COVID)  RVPGX2  MRSA NEXT GEN BY PCR,  NASAL  CULTURE, BLOOD (ROUTINE X 2)  CULTURE, BLOOD (ROUTINE  X 2)  LACTIC ACID, PLASMA  TSH  T4, FREE  PROCALCITONIN  URINALYSIS, W/ REFLEX TO CULTURE (INFECTION SUSPECTED)  HIV ANTIBODY (ROUTINE TESTING W REFLEX)  CREATININE, SERUM  HEPATITIS B SURFACE ANTIGEN  HEPATITIS B SURFACE ANTIBODY, QUANTITATIVE     EKG My EKG interpretation: Rate of 63, normal sinus rhythm.  Normal axis.  Slightly prolonged PR interval at 248.  Questionable peaked T waves but no prolongation of the QRS.  Otherwise, no acute ST elevations or depressions.   RADIOLOGY My interpretation of chest x-ray and CT abdomen/pelvis shows no acute pathology.   PROCEDURES:  Critical Care performed: Yes, see critical care procedure note(s)  .Critical Care  Performed by: Janith Lima, MD Authorized by: Janith Lima, MD   Critical care provider statement:    Critical care time (minutes):  40   Critical care time was exclusive of:  Separately billable procedures and treating other patients   Critical care was necessary to treat or prevent imminent or life-threatening deterioration of the following conditions: Diabetic ketoacidosis.   Critical care was time spent personally by me on the following activities:  Ordering and performing treatments and interventions, ordering and review of laboratory studies, ordering and review of radiographic studies, pulse oximetry, re-evaluation of patient's condition, blood draw for specimens, development of treatment plan with patient or surrogate, discussions with consultants, evaluation of patient's response to treatment, examination of patient and interpretation of cardiac output measurements   Care discussed with: admitting provider      MEDICATIONS ORDERED IN ED: Medications  insulin regular, human (MYXREDLIN) 100 units/ 100 mL infusion (8 Units/hr Intravenous New Bag/Given 08/28/22 1803)  lactated ringers infusion ( Intravenous New Bag/Given 08/28/22 1804)  dextrose 5 % in  lactated ringers infusion (has no administration in time range)  dextrose 50 % solution 0-50 mL (has no administration in time range)  sodium chloride flush (NS) 0.9 % injection 3 mL (3 mLs Intravenous Given 08/28/22 2121)  sodium chloride flush (NS) 0.9 % injection 3 mL (has no administration in time range)  0.9 %  sodium chloride infusion (has no administration in time range)  acetaminophen (TYLENOL) tablet 650 mg (has no administration in time range)  docusate sodium (COLACE) capsule 100 mg (has no administration in time range)  polyethylene glycol (MIRALAX / GLYCOLAX) packet 17 g (has no administration in time range)  ondansetron (ZOFRAN) injection 4 mg (has no administration in time range)  Chlorhexidine Gluconate Cloth 2 % PADS 6 each (has no administration in time range)  vancomycin (VANCOREADY) IVPB 1500 mg/300 mL (has no administration in time range)  ceFEPIme (MAXIPIME) 1 g in sodium chloride 0.9 % 100 mL IVPB (has no administration in time range)  vancomycin (VANCOREADY) IVPB 750 mg/150 mL (has no administration in time range)  lactated ringers bolus 500 mL (0 mLs Intravenous Stopped 08/28/22 1804)  ondansetron (ZOFRAN) injection 4 mg (4 mg Intravenous Given 08/28/22 1656)     IMPRESSION / MDM / ASSESSMENT AND PLAN / ED COURSE  I reviewed the triage vital signs and the nursing notes.                              Differential diagnosis includes, but is not limited to, DKA, SBO, viral gastroenteritis, diverticulitis, volume overload from missed dialysis session, sepsis from intra-abdominal infection.  Patient's presentation is most consistent with acute presentation with potential threat to life or bodily function.  Patient is a  67 year old male presenting today for nausea, vomiting, and diarrhea.  Laboratory workup indicative of DKA with a pH of 7.01, bicarb 4.5, and blood glucose greater than thousand.  Patient with normal mentation status and no difficulty breathing or unstable  vital signs.  CT abdomen/pelvis unremarkable for further intra-abdominal pathology at this time.  Patient was started on DKA protocol with insulin that will help his hyperkalemia.  Hyperkalemia likely secondary to missed dialysis yesterday.  Spoke with nephrology who will continue to evaluate patient for possible dialysis later tonight.  Spoke with intensivist who plans to admit patient to ICU for further care.  The patient is on the cardiac monitor to evaluate for evidence of arrhythmia and/or significant heart rate changes. Clinical Course as of 08/28/22 2321  Sat Aug 28, 2022  1651 CBC with Differential(!) Mild anemia compared with most recent laboratory workup.  No leukocytosis at this time. [DW]  1651 Glucose-Capillary(!!): >600 [DW]  1701 Bicarbonate(!): 4.5 [DW]  1701 pH, Ven(!!): 7.01 [DW]  1704 Initiating DKA protocol at this time [DW]  1715 Lactic Acid, Venous(!!): 2.0 [DW]  1755 Beta-Hydroxybutyric Acid(!): >8.00 [DW]  1755 Potassium(!!): 7.1 [DW]  1755 Glucose(!!): 1,034 [DW]  1813 Spoke with nephrology who recommends continuing treatment from a DKA standpoint to help with his potassium.  They will discuss once he is in the ICU versus stepdown whether he needs dialysis tonight.  Also discussed with intensivist who will come evaluate patient for ICU versus admitting him to a stepdown unit. [DW]    Clinical Course User Index [DW] Janith Lima, MD     FINAL CLINICAL IMPRESSION(S) / ED DIAGNOSES   Final diagnoses:  Diabetic ketoacidosis without coma associated with type 2 diabetes mellitus ([name])  Nausea and vomiting, unspecified vomiting type  ESRD on dialysis ([name])  Hyperkalemia     Rx / DC Orders   ED Discharge Orders     None        Note:  This document was prepared using Dragon voice recognition software and may include unintentional dictation errors.   Janith Lima, MD 08/28/22 [phone]

## 2022-08-29 ENCOUNTER — Inpatient Hospital Stay: Payer: 59

## 2022-08-29 DIAGNOSIS — E1110 Type 2 diabetes mellitus with ketoacidosis without coma: Secondary | ICD-10-CM | POA: Diagnosis not present

## 2022-08-29 DIAGNOSIS — Z992 Dependence on renal dialysis: Secondary | ICD-10-CM

## 2022-08-29 DIAGNOSIS — N186 End stage renal disease: Secondary | ICD-10-CM

## 2022-08-29 LAB — COMPREHENSIVE METABOLIC PANEL
ALT: 11 U/L (ref 0–44)
AST: 23 U/L (ref 15–41)
Albumin: 3.1 g/dL — ABNORMAL LOW (ref 3.5–5.0)
Alkaline Phosphatase: 84 U/L (ref 38–126)
Anion gap: 14 (ref 5–15)
BUN: 59 mg/dL — ABNORMAL HIGH (ref 8–23)
CO2: 22 mmol/L (ref 22–32)
Calcium: 8.2 mg/dL — ABNORMAL LOW (ref 8.9–10.3)
Chloride: 95 mmol/L — ABNORMAL LOW (ref 98–111)
Creatinine, Ser: 4.21 mg/dL — ABNORMAL HIGH (ref 0.61–1.24)
GFR, Estimated: 15 mL/min — ABNORMAL LOW (ref >60)
Glucose, Bld: 304 mg/dL — ABNORMAL HIGH (ref 70–99)
Potassium: 3.3 mmol/L — ABNORMAL LOW (ref 3.5–5.1)
Sodium: 131 mmol/L — ABNORMAL LOW (ref 135–145)
Total Bilirubin: 0.8 mg/dL (ref 0.3–1.2)
Total Protein: 6.6 g/dL (ref 6.5–8.1)

## 2022-08-29 LAB — CBC
HCT: 23.9 % — ABNORMAL LOW (ref 39.0–52.0)
Hemoglobin: 8.2 g/dL — ABNORMAL LOW (ref 13.0–17.0)
MCH: 29.3 pg (ref 26.0–34.0)
MCHC: 34.3 g/dL (ref 30.0–36.0)
MCV: 85.4 fL (ref 80.0–100.0)
Platelets: 214 10*3/uL (ref 150–400)
RBC: 2.8 MIL/uL — ABNORMAL LOW (ref 4.22–5.81)
RDW: 15.1 % (ref 11.5–15.5)
WBC: 8 10*3/uL (ref 4.0–10.5)
nRBC: 0.5 % — ABNORMAL HIGH (ref 0.0–0.2)

## 2022-08-29 LAB — C DIFFICILE QUICK SCREEN W PCR REFLEX
C Diff antigen: NEGATIVE
C Diff interpretation: NOT DETECTED
C Diff toxin: NEGATIVE

## 2022-08-29 LAB — BLOOD GAS, ARTERIAL
Acid-Base Excess: 0.8 mmol/L (ref 0.0–2.0)
Bicarbonate: 25.3 mmol/L (ref 20.0–28.0)
O2 Saturation: 71.2 %
Patient temperature: 37
pCO2 arterial: 39 mmHg (ref 32–48)
pH, Arterial: 7.42 (ref 7.35–7.45)
pO2, Arterial: 37 mmHg — CL (ref 83–108)

## 2022-08-29 LAB — GLUCOSE, CAPILLARY
Glucose-Capillary: 103 mg/dL — ABNORMAL HIGH (ref 70–99)
Glucose-Capillary: 121 mg/dL — ABNORMAL HIGH (ref 70–99)
Glucose-Capillary: 153 mg/dL — ABNORMAL HIGH (ref 70–99)
Glucose-Capillary: 168 mg/dL — ABNORMAL HIGH (ref 70–99)
Glucose-Capillary: 199 mg/dL — ABNORMAL HIGH (ref 70–99)
Glucose-Capillary: 204 mg/dL — ABNORMAL HIGH (ref 70–99)
Glucose-Capillary: 228 mg/dL — ABNORMAL HIGH (ref 70–99)
Glucose-Capillary: 241 mg/dL — ABNORMAL HIGH (ref 70–99)
Glucose-Capillary: 259 mg/dL — ABNORMAL HIGH (ref 70–99)
Glucose-Capillary: 287 mg/dL — ABNORMAL HIGH (ref 70–99)
Glucose-Capillary: 321 mg/dL — ABNORMAL HIGH (ref 70–99)
Glucose-Capillary: 332 mg/dL — ABNORMAL HIGH (ref 70–99)
Glucose-Capillary: 335 mg/dL — ABNORMAL HIGH (ref 70–99)
Glucose-Capillary: 353 mg/dL — ABNORMAL HIGH (ref 70–99)
Glucose-Capillary: 414 mg/dL — ABNORMAL HIGH (ref 70–99)
Glucose-Capillary: 450 mg/dL — ABNORMAL HIGH (ref 70–99)
Glucose-Capillary: 76 mg/dL (ref 70–99)
Glucose-Capillary: 84 mg/dL (ref 70–99)

## 2022-08-29 LAB — HIV ANTIBODY (ROUTINE TESTING W REFLEX): HIV Screen 4th Generation wRfx: NONREACTIVE

## 2022-08-29 LAB — HEPATITIS B SURFACE ANTIGEN: Hepatitis B Surface Ag: NONREACTIVE

## 2022-08-29 MED ORDER — POTASSIUM CHLORIDE 10 MEQ/100ML IV SOLN
10.0000 meq | Freq: Once | INTRAVENOUS | Status: AC
  Administered 2022-08-29: 10 meq via INTRAVENOUS
  Filled 2022-08-29: qty 100

## 2022-08-29 MED ORDER — INSULIN ASPART 100 UNIT/ML IJ SOLN: 0.0000 [IU] | Freq: Every day | INTRAMUSCULAR | Status: DC

## 2022-08-29 MED ORDER — INSULIN GLARGINE-YFGN 100 UNIT/ML ~~LOC~~ SOLN
20.0000 [IU] | Freq: Every day | SUBCUTANEOUS | Status: DC
  Administered 2022-08-29 – 2022-08-30 (×2): 20 [IU] via SUBCUTANEOUS
  Filled 2022-08-29 (×2): qty 0.2

## 2022-08-29 MED ORDER — HEPARIN SODIUM (PORCINE) 5000 UNIT/ML IJ SOLN
5000.0000 [IU] | Freq: Three times a day (TID) | INTRAMUSCULAR | Status: DC
  Administered 2022-08-29: 5000 [IU] via SUBCUTANEOUS
  Filled 2022-08-29: qty 1

## 2022-08-29 MED ORDER — INSULIN ASPART 100 UNIT/ML IJ SOLN: 0.0000 [IU] | Freq: Three times a day (TID) | INTRAMUSCULAR | Status: DC

## 2022-08-29 MED ORDER — ORAL CARE MOUTH RINSE: 15.0000 mL | OROMUCOSAL | Status: DC | PRN

## 2022-08-29 MED ORDER — PANTOPRAZOLE SODIUM 40 MG IV SOLR
40.0000 mg | Freq: Two times a day (BID) | INTRAVENOUS | Status: DC
  Administered 2022-08-29 – 2022-08-30 (×4): 40 mg via INTRAVENOUS
  Filled 2022-08-29 (×4): qty 10

## 2022-08-29 NOTE — Progress Notes (Addendum)
Progress Note    Shawn [name]  NFA:[id number] DOB: Mar 30, 1955  DOA: 08/28/2022 PCP: [facility]      Brief Narrative:    Medical records reviewed and are as summarized below:  Shawn [name] is a 67 y.o. male  PMH significant for chronic HFrEF due to reported NICM, hypothyroidism, EtOH abuse, tobacco abuse, T2DM, HTN, CVA, ESRD on HD MWF, and anemia of chronic disease who presented to the ED with chief complaints of nausea, vomiting, diarrhea with dark stools x 2 days.   Patient states for the past week he has had worsening nausea, vomiting, and diarrhea associated with poor p.o. intake. He admits to not taking his insulin has prescribed due to poor po intake.  He had his last hemodialysis (HD) session a day prior to admission.   ED Course: Initial vital signs showed HR of 65 beats/minute, BP 124/61 mm Hg, the RR 21 breaths/minute, and the oxygen saturation 100% on RA and a temperature of 98.41F (37.5C). Pertinent Labs/Diagnostics Findings: Na+/ K+:128/7.1  Glucose: 1034 BUN/Cr.:134/9.04 Calcium: 7.8, CO2:<7, Anion Gap high WBC:10 Hgb/Hct:8.7/30.7  PCT: negative <0.10  Beta Hydroxy:>8.00 Lactic acid: 2.0 COVID PCR: pending  VBG: pO2 85; pCO2 18; pH 7.01;  HCO3 4.5, %O2 Sat 96.4.    She was admitted to the hospital for DKA and hyperkalemia.      Assessment/Plan:   Active Problems:   Acute on chronic HFrEF (heart failure with reduced ejection fraction) ([name])   Hyperkalemia   Acute metabolic encephalopathy   DKA (diabetic ketoacidosis) ([name])   ESRD on hemodialysis ([name])   Body mass index is 19.89 kg/m.    Type II DM with DKA, probably due to missed insulin doses: DKA has resolved.  Anion gap is closed.  Start insulin glargine 20 units daily.  Discontinue IV insulin drip and IV fluids 2 hours after administration of insulin glargine.  Use NovoLog as needed for hyperglycemia. Lactic acidosis likely due to DKA. Last hemoglobin A1c on record  was 11.9 on 06/24/2021.  Repeat hemoglobin A1c.   ESRD with hyperkalemia: He underwent emergent hemodialysis on admission.  Hyperkalemia has resolved. Hypokalemia: Potassium dropped to 3.3.  Defer treatment to nephrologist.   Acute on chronic diastolic CHF, anasarca (ascites, peripheral edema): Fluid management with hemodialysis.  Torsemide on hold.   Suspected sepsis of unknown cause: No evidence of infection at this time.  Suspect patient had SIRS from DKA.  Continue empiric IV antibiotics for now (on cefepime and vancomycin).  Follow-up blood cultures.   Acute metabolic encephalopathy: Probably from recent metabolic derangements.  Continue supportive care   Dark stools: Start IV Protonix.  Discontinue prophylactic subcutaneous heparin.  Use SCDs for DVT prophylaxis.   Acute on chronic anemia: Hemoglobin is down to 8.2.  Hemoglobin was 8.7 on admission.  Monitor H&H.  Aspirin has been held.   Pseudohyponatremia: Improved   Other comorbidities include hypothyroidism, tobacco use disorder   Diet Order             Diet NPO time specified  Diet effective now                            Consultants: Intensivist Nephrologist  Procedures: None     Medications:    Chlorhexidine Gluconate Cloth  6 each Topical Q0600   insulin glargine-yfgn  20 Units Subcutaneous Daily   pantoprazole (PROTONIX) IV  40 mg Intravenous Q12H   sodium  chloride flush  3 mL Intravenous Q12H   Continuous Infusions:  sodium chloride     ceFEPime (MAXIPIME) IV Stopped (08/29/22 0013)   dextrose 5% lactated ringers 125 mL/hr at 08/29/22 1000   insulin 6 Units/hr (08/29/22 1000)   lactated ringers Stopped (08/29/22 1610)   potassium chloride 10 mEq (08/29/22 1104)   [START ON 08/30/2022] vancomycin       Anti-infectives (From admission, onward)    Start     Dose/Rate Route Frequency Ordered Stop   08/30/22 1200  vancomycin (VANCOREADY) IVPB 750 mg/150 mL        750 mg 150  mL/hr over 60 Minutes Intravenous Every M-W-F (Hemodialysis) 08/28/22 2314     08/28/22 2345  vancomycin (VANCOREADY) IVPB 1500 mg/300 mL        1,500 mg 150 mL/hr over 120 Minutes Intravenous  Once 08/28/22 2258 08/29/22 0407   08/28/22 2345  ceFEPIme (MAXIPIME) 1 g in sodium chloride 0.9 % 100 mL IVPB        1 g 200 mL/hr over 30 Minutes Intravenous Every 24 hours 08/28/22 2259                Family Communication/Anticipated D/C date and plan/Code Status   DVT prophylaxis: Place and maintain sequential compression device Start: 08/29/22 1152 SCDs Start: 08/28/22 1838     Code Status: Full Code  Family Communication: None Disposition Plan: Plan to discharge home when medically stable   Status is: Inpatient Remains inpatient appropriate because: DKA       Subjective:   Interval events noted.  He is confused and cannot provide any history.  Philippa Chester, RN and Thayer Ohm, CNA, were at the bedside.  Brittney reported that patient had dark stools this morning.  Objective:    Vitals:   08/29/22 1000 08/29/22 1030 08/29/22 1100 08/29/22 1130  BP: (!) 85/52 (!) 98/57 99/61   Pulse: 69 70 69   Resp: (!) 23 (!) 23 (!) 22   Temp:    97.7 F (36.5 C)  TempSrc:    Oral  SpO2: 91% 92% 95%   Weight:      Height:       No data found.   Intake/Output Summary (Last 24 hours) at 08/29/2022 1200 Last data filed at 08/29/2022 1000 Gross per 24 hour  Intake 2642.27 ml  Output 0 ml  Net 2642.27 ml   Filed Weights   08/28/22 1627 08/29/22 0209  Weight: 63.5 kg 64.7 kg    Exam:  GEN: NAD SKIN: No rash EYES: EOMI ENT: MMM CV: RRR PULM: CTA B ABD: soft, distended, NT, +BS CNS: AAO x 2 (person and place), non focal EXT: B/l leg edema, no tenderness       Data Reviewed:   I have personally reviewed following labs and imaging studies:  Labs: Labs show the following:   Basic Metabolic Panel: Recent Labs  Lab 08/28/22 1634 08/28/22 2011 08/29/22 0410  NA  128* 129* 131*  K 7.1* 6.1* 3.3*  CL 93* 94* 95*  CO2 <7* <7* 22  GLUCOSE 1,034* 1,015* 304*  BUN 134* 135* 59*  CREATININE 9.04* 9.25* 4.21*  CALCIUM 7.8* 8.0* 8.2*  PHOS  --  9.4*  --    GFR Estimated Creatinine Clearance: 15.8 mL/min (A) (by C-G formula based on SCr of 4.21 mg/dL (H)). Liver Function Tests: Recent Labs  Lab 08/28/22 1634 08/28/22 2011 08/29/22 0410  AST 21  --  23  ALT 12  --  11  ALKPHOS 89  --  84  BILITOT 1.5*  --  0.8  PROT 7.0  --  6.6  ALBUMIN 3.4* 3.6 3.1*   No results for input(s): "LIPASE", "AMYLASE" in the last 168 hours. No results for input(s): "AMMONIA" in the last 168 hours. Coagulation profile Recent Labs  Lab 08/28/22 1634  INR 1.8*    CBC: Recent Labs  Lab 08/28/22 1634 08/28/22 2011 08/28/22 2012 08/29/22 0407  WBC 10.5 9.9 9.7 8.0  NEUTROABS 8.8*  --   --   --   HGB 8.7* 8.3* 8.4* 8.2*  HCT 30.7* 27.8* 29.1* 23.9*  MCV 101.0* 95.9 99.3 85.4  PLT 247 241 231 214   Cardiac Enzymes: No results for input(s): "CKTOTAL", "CKMB", "CKMBINDEX", "TROPONINI" in the last 168 hours. BNP (last 3 results) No results for input(s): "PROBNP" in the last 8760 hours. CBG: Recent Labs  Lab 08/29/22 0718 08/29/22 0829 08/29/22 0940 08/29/22 1049 08/29/22 1132  GLUCAP 259* 228* 204* 199* 168*   D-Dimer: No results for input(s): "DDIMER" in the last 72 hours. Hgb A1c: No results for input(s): "HGBA1C" in the last 72 hours. Lipid Profile: No results for input(s): "CHOL", "HDL", "LDLCALC", "TRIG", "CHOLHDL", "LDLDIRECT" in the last 72 hours. Thyroid function studies: Recent Labs    08/28/22 1634  TSH 1.323   Anemia work up: No results for input(s): "VITAMINB12", "FOLATE", "FERRITIN", "TIBC", "IRON", "RETICCTPCT" in the last 72 hours. Sepsis Labs: Recent Labs  Lab 08/28/22 1634 08/28/22 2011 08/28/22 2012 08/28/22 2100 08/29/22 0407  PROCALCITON  --   --   --  1.19  --   WBC 10.5 9.9 9.7  --  8.0  LATICACIDVEN 2.0* 1.6   --   --   --     Microbiology Recent Results (from the past 240 hour(s))  Culture, blood (Routine x 2)     Status: None (Preliminary result)   Collection Time: 08/28/22  4:37 PM   Specimen: BLOOD  Result Value Ref Range Status   Specimen Description BLOOD BLOOD RIGHT ARM  Final   Special Requests   Final    BOTTLES DRAWN AEROBIC AND ANAEROBIC Blood Culture adequate volume   Culture   Final    NO GROWTH < 24 HOURS Performed at [facility], [address], [place], [place] [address]    Report Status PENDING  Incomplete  Culture, blood (Routine x 2)     Status: None (Preliminary result)   Collection Time: 08/28/22  5:17 PM   Specimen: BLOOD  Result Value Ref Range Status   Specimen Description BLOOD BLOOD LEFT WRIST  Final   Special Requests   Final    AEROBIC BOTTLE ONLY Blood Culture results may not be optimal due to an inadequate volume of blood received in culture bottles   Culture   Final    NO GROWTH < 24 HOURS Performed at [facility], [address] [place], [place] [address]    Report Status PENDING  Incomplete  MRSA Next Gen by PCR, Nasal     Status: None   Collection Time: 08/28/22  8:30 PM   Specimen: Nasal Mucosa; Nasal Swab  Result Value Ref Range Status   MRSA by PCR Next Gen NOT DETECTED NOT DETECTED Final    Comment: (NOTE) The GeneXpert MRSA Assay (FDA approved for NASAL specimens only), is one component of a comprehensive MRSA colonization surveillance program. It is not intended to diagnose MRSA infection nor to guide or monitor treatment for MRSA infections.  Test performance is not FDA approved in patients less than 22 years old. Performed at [facility], [address] [place], [place] [address]   Resp panel by RT-PCR (RSV, Flu A&B, Covid) Nasal Mucosa     Status: None   Collection Time: 08/28/22  9:19 PM   Specimen: Nasal Mucosa; Nasal Swab  Result Value Ref Range Status   SARS Coronavirus 2 by RT PCR NEGATIVE  NEGATIVE Final    Comment: (NOTE) SARS-CoV-2 target nucleic acids are NOT DETECTED.  The SARS-CoV-2 RNA is generally detectable in upper respiratory specimens during the acute phase of infection. The lowest concentration of SARS-CoV-2 viral copies this assay can detect is 138 copies/mL. A negative result does not preclude SARS-Cov-2 infection and should not be used as the sole basis for treatment or other patient management decisions. A negative result may occur with  improper specimen collection/handling, submission of specimen other than nasopharyngeal swab, presence of viral mutation(s) within the areas targeted by this assay, and inadequate number of viral copies(<138 copies/mL). A negative result must be combined with clinical observations, patient history, and epidemiological information. The expected result is Negative.  Fact Sheet for Patients:  BloggerCourse.com  Fact Sheet for Healthcare Providers:  SeriousBroker.it  This test is no t yet approved or cleared by the [place] FDA and  has been authorized for detection and/or diagnosis of SARS-CoV-2 by FDA under an Emergency Use Authorization (EUA). This EUA will remain  in effect (meaning this test can be used) for the duration of the COVID-19 declaration under Section 564(b)(1) of the Act, 21 U.S.C.section 360bbb-3(b)(1), unless the authorization is terminated  or revoked sooner.       Influenza A by PCR NEGATIVE NEGATIVE Final   Influenza B by PCR NEGATIVE NEGATIVE Final    Comment: (NOTE) The Xpert Xpress SARS-CoV-2/FLU/RSV plus assay is intended as an aid in the diagnosis of influenza from Nasopharyngeal swab specimens and should not be used as a sole basis for treatment. Nasal washings and aspirates are unacceptable for Xpert Xpress SARS-CoV-2/FLU/RSV testing.  Fact Sheet for Patients: BloggerCourse.com  Fact Sheet for Healthcare  Providers: SeriousBroker.it  This test is not yet approved or cleared by the [place] FDA and has been authorized for detection and/or diagnosis of SARS-CoV-2 by FDA under an Emergency Use Authorization (EUA). This EUA will remain in effect (meaning this test can be used) for the duration of the COVID-19 declaration under Section 564(b)(1) of the Act, 21 U.S.C. section 360bbb-3(b)(1), unless the authorization is terminated or revoked.     Resp Syncytial Virus by PCR NEGATIVE NEGATIVE Final    Comment: (NOTE) Fact Sheet for Patients: BloggerCourse.com  Fact Sheet for Healthcare Providers: SeriousBroker.it  This test is not yet approved or cleared by the [place] FDA and has been authorized for detection and/or diagnosis of SARS-CoV-2 by FDA under an Emergency Use Authorization (EUA). This EUA will remain in effect (meaning this test can be used) for the duration of the COVID-19 declaration under Section 564(b)(1) of the Act, 21 U.S.C. section 360bbb-3(b)(1), unless the authorization is terminated or revoked.  Performed at [facility], [address] [place], [place] [address]     Procedures and diagnostic studies:  DG Chest 1 View  Result Date: 08/29/2022 CLINICAL DATA:  67 year old male with history of shortness of breath. EXAM: CHEST  1 VIEW COMPARISON:  Chest x-ray 08/28/2022. FINDINGS: Right internal jugular PermCath with tip terminating in the right atrium. Lung volumes are low. Cephalization  of the pulmonary vasculature with indistinct interstitial markings. No confluent consolidative airspace disease. No definite pleural effusions. No pneumothorax. Heart size is mildly enlarged. The patient is rotated to the right on today's exam, resulting in distortion of the mediastinal contours and reduced diagnostic sensitivity and specificity for mediastinal pathology. IMPRESSION: 1.  Cardiomegaly with pulmonary vascular congestion and evidence of mild interstitial pulmonary edema; findings concerning for fluid overload or congestive heart failure. Electronically Signed   By: Trudie Reed M.D.   On: 08/29/2022 06:34   CT ABDOMEN PELVIS WO CONTRAST  Result Date: 08/28/2022 CLINICAL DATA:  Lower abdominal pain. Nausea, vomiting, and diarrhea for 2 days. Dark stool. EXAM: CT ABDOMEN AND PELVIS WITHOUT CONTRAST TECHNIQUE: Multidetector CT imaging of the abdomen and pelvis was performed following the standard protocol without IV contrast. RADIATION DOSE REDUCTION: This exam was performed according to the departmental dose-optimization program which includes automated exposure control, adjustment of the mA and/or kV according to patient size and/or use of iterative reconstruction technique. COMPARISON:  08/13/2021 FINDINGS: Lower chest: Small right pleural effusion. Hepatobiliary: No mass visualized on this unenhanced exam. Contrast noted in the gallbladder and common bile duct. No evidence of cholecystitis. Pancreas: Limited visualization due to peripancreatic fluid and lack of intravenous contrast. Spleen:  Within normal limits in size. Adrenals/Urinary tract: Residual contrast enhancement of both kidneys seen from previous IV contrast administration. No No evidence of mass or hydronephrosis. Small amount of contrast noted in urinary bladder. Stomach/Bowel: Limited visualization due to large amount of ascites. No evidence of dilated bowel loops. Vascular/Lymphatic: Limited by large amount of ascites. No definite lymphadenopathy identified. No evidence of abdominal aortic aneurysm. Reproductive:  Unremarkable. Other: Small bilateral inguinal hernias which contain ascites. Diffuse body wall edema. Musculoskeletal:  No suspicious bone lesions identified. IMPRESSION: Large amount of ascites, and anasarca. Ascites and lack of IV contrast limits visualization of the internal organs. Small right  pleural effusion. Small bilateral inguinal hernias, which contain ascites. Electronically Signed   By: Danae Orleans M.D.   On: 08/28/2022 17:55   DG Chest Port 1 View  Result Date: 08/28/2022 CLINICAL DATA:  Nausea and vomiting and diarrhea for 2 days. Dark stools. Patient is on dialysis EXAM: PORTABLE CHEST 1 VIEW semi upright COMPARISON:  X-ray 08/13/2021 FINDINGS: Enlarged cardiopericardial silhouette. Vascular congestion. No edema. No consolidation or effusion. Double-lumen large-bore right IJ catheter seen with tip along the right atrium. Overlapping cardiac leads. IMPRESSION: Right IJ line. Enlarged heart with some central vascular congestion. Electronically Signed   By: Karen Kays M.D.   On: 08/28/2022 17:15               LOS: 1 day   Reyden Smith  [facility]   Pager on [email or web address]. If 7PM-7AM, please contact night-coverage at [email or web address]     08/29/2022, 12:00 PM

## 2022-08-29 NOTE — Progress Notes (Signed)
Received patient at bed in ICU.  Alert and oriented.  Informed consent signed and in chart.   TX duration: 3.25 Hours  Patient tolerated well.  Patient asleep, condition stable.  Alert, without acute distress.  Hand-off given to patient's nurse.   Access used: RIJ TDC Access issues: None  Total UF removed: 0  Medication(s) given: None Post HD VS: please see Data Insert Post HD weight: Unable to obtain    08/29/22 0145  Vitals  Temp 97.6 F (36.4 C)  Temp Source Axillary  BP 136/77  MAP (mmHg) 95  BP Location Right Arm  BP Method Automatic  Patient Position (if appropriate) Lying  Pulse Rate 76  Pulse Rate Source Monitor  ECG Heart Rate 77  Resp (!) 25  Oxygen Therapy  SpO2 92 %  O2 Device Room Air  Patient Activity (if Appropriate) In bed  Pulse Oximetry Type Continuous  Post Treatment  Dialyzer Clearance Lightly streaked  Hemodialysis Intake (mL) 0 mL  Liters Processed 78  Fluid Removed (mL) 0 mL  Tolerated HD Treatment Yes  Post-Hemodialysis Comments Tx completed without issue.  Note  Patient Observations Patient resting, VSS, no c/o voiced, no acute distress noted, condition stable for this treatment d/c.  Hemodialysis Catheter Right Internal jugular Double lumen Permanent (Tunneled)  No placement date or time found.   Placed prior to admission: Yes  Orientation: Right  Access Location: Internal jugular  Hemodialysis Catheter Type: Double lumen Permanent (Tunneled)  Site Condition No complications  Blue Lumen Status Flushed;Heparin locked;Dead end cap in place  Red Lumen Status Flushed;Heparin locked;Dead end cap in place  Purple Lumen Status N/A  Catheter fill solution Heparin 1000 units/ml  Catheter fill volume (Arterial) 1.8 cc  Catheter fill volume (Venous) 1.8  Dressing Type Transparent  Dressing Status Antimicrobial disc in place;Clean, Dry, Intact  Drainage Description None  Post treatment catheter status Capped and Clamped      [name]

## 2022-08-29 NOTE — Progress Notes (Signed)
[facility]  ROUNDING NOTE   Subjective:   Shawn [name] was admitted to [facility] on 08/28/2022 for Hyperkalemia [E87.5] DKA (diabetic ketoacidosis) ([name]) [E11.10] ESRD on dialysis ([name]) [N18.6, Z99.2] Diabetic ketoacidosis without coma associated with type 2 diabetes mellitus ([name]) [E11.10] Nausea and vomiting, unspecified vomiting type [R11.2]  Patient presents with nausea, vomiting, diarrhea with dark stools. He has not been eating and not taking his medications. Patient now with abdominal pain and weakness. Nephrology consulted for hyperkalemia and metabolic acidosis.   Emergent hemodialysis last night for hyperkalemia.  Today, patient's anion gap is closing. Patient is lethargic and unable to give much of a history.   Objective:  Vital signs in last 24 hours:  Temp:  [95.8 F (35.4 C)-99.5 F (37.5 C)] 97.7 F (36.5 C) (08/25 1130) Pulse Rate:  [57-80] 69 (08/25 1100) Resp:  [16-28] 22 (08/25 1100) BP: (85-141)/(52-101) 99/61 (08/25 1100) SpO2:  [89 %-100 %] 95 % (08/25 1100) Weight:  [63.5 kg-64.7 kg] 64.7 kg (08/25 0209)  Weight change:  Filed Weights   08/28/22 1627 08/29/22 0209  Weight: 63.5 kg 64.7 kg    Intake/Output: I/O last 3 completed shifts: In: 2245.9 [I.V.:1346; IV Piggyback:899.9] Out: 0    Intake/Output this shift:  Total I/O In: 396.4 [I.V.:396.4] Out: -   Physical Exam: General: NAD, ill appearing  Head: Normocephalic, atraumatic. Moist oral mucosal membranes  Eyes: Anicteric, PERRL  Neck: Supple, trachea midline  Lungs:  Clear to auscultation  Heart: Regular rate and rhythm  Abdomen:  Soft, nontender,   Extremities:  no peripheral edema.  Neurologic: Nonfocal, moving all four extremities  Skin: No lesions  Access: RIJ permcath    Basic Metabolic Panel: Recent Labs  Lab 08/28/22 1634 08/28/22 2011 08/29/22 0410  NA 128* 129* 131*  K 7.1* 6.1* 3.3*  CL 93* 94* 95*  CO2 <7* <7* 22  GLUCOSE 1,034* 1,015* 304*   BUN 134* 135* 59*  CREATININE 9.04* 9.25* 4.21*  CALCIUM 7.8* 8.0* 8.2*  PHOS  --  9.4*  --     Liver Function Tests: Recent Labs  Lab 08/28/22 1634 08/28/22 2011 08/29/22 0410  AST 21  --  23  ALT 12  --  11  ALKPHOS 89  --  84  BILITOT 1.5*  --  0.8  PROT 7.0  --  6.6  ALBUMIN 3.4* 3.6 3.1*   No results for input(s): "LIPASE", "AMYLASE" in the last 168 hours. No results for input(s): "AMMONIA" in the last 168 hours.  CBC: Recent Labs  Lab 08/28/22 1634 08/28/22 2011 08/28/22 2012 08/29/22 0407  WBC 10.5 9.9 9.7 8.0  NEUTROABS 8.8*  --   --   --   HGB 8.7* 8.3* 8.4* 8.2*  HCT 30.7* 27.8* 29.1* 23.9*  MCV 101.0* 95.9 99.3 85.4  PLT 247 241 231 214    Cardiac Enzymes: No results for input(s): "CKTOTAL", "CKMB", "CKMBINDEX", "TROPONINI" in the last 168 hours.  BNP: Invalid input(s): "POCBNP"  CBG: Recent Labs  Lab 08/29/22 0718 08/29/22 0829 08/29/22 0940 08/29/22 1049 08/29/22 1132  GLUCAP 259* 228* 204* 199* 168*    Microbiology: Results for orders placed or performed during the hospital encounter of 08/28/22  Culture, blood (Routine x 2)     Status: None (Preliminary result)   Collection Time: 08/28/22  4:37 PM   Specimen: BLOOD  Result Value Ref Range Status   Specimen Description BLOOD BLOOD RIGHT ARM  Final   Special Requests   Final  BOTTLES DRAWN AEROBIC AND ANAEROBIC Blood Culture adequate volume   Culture   Final    NO GROWTH < 24 HOURS Performed at [facility], [address] [place], [place] [address]    Report Status PENDING  Incomplete  Culture, blood (Routine x 2)     Status: None (Preliminary result)   Collection Time: 08/28/22  5:17 PM   Specimen: BLOOD  Result Value Ref Range Status   Specimen Description BLOOD BLOOD LEFT WRIST  Final   Special Requests   Final    AEROBIC BOTTLE ONLY Blood Culture results may not be optimal due to an inadequate volume of blood received in culture bottles   Culture   Final     NO GROWTH < 24 HOURS Performed at [facility], [address] [place], [place] [address]    Report Status PENDING  Incomplete  MRSA Next Gen by PCR, Nasal     Status: None   Collection Time: 08/28/22  8:30 PM   Specimen: Nasal Mucosa; Nasal Swab  Result Value Ref Range Status   MRSA by PCR Next Gen NOT DETECTED NOT DETECTED Final    Comment: (NOTE) The GeneXpert MRSA Assay (FDA approved for NASAL specimens only), is one component of a comprehensive MRSA colonization surveillance program. It is not intended to diagnose MRSA infection nor to guide or monitor treatment for MRSA infections. Test performance is not FDA approved in patients less than 4 years old. Performed at [facility], [address] [place], [place] [address]   Resp panel by RT-PCR (RSV, Flu A&B, Covid) Nasal Mucosa     Status: None   Collection Time: 08/28/22  9:19 PM   Specimen: Nasal Mucosa; Nasal Swab  Result Value Ref Range Status   SARS Coronavirus 2 by RT PCR NEGATIVE NEGATIVE Final    Comment: (NOTE) SARS-CoV-2 target nucleic acids are NOT DETECTED.  The SARS-CoV-2 RNA is generally detectable in upper respiratory specimens during the acute phase of infection. The lowest concentration of SARS-CoV-2 viral copies this assay can detect is 138 copies/mL. A negative result does not preclude SARS-Cov-2 infection and should not be used as the sole basis for treatment or other patient management decisions. A negative result may occur with  improper specimen collection/handling, submission of specimen other than nasopharyngeal swab, presence of viral mutation(s) within the areas targeted by this assay, and inadequate number of viral copies(<138 copies/mL). A negative result must be combined with clinical observations, patient history, and epidemiological information. The expected result is Negative.  Fact Sheet for Patients:  BloggerCourse.com  Fact Sheet for  Healthcare Providers:  SeriousBroker.it  This test is no t yet approved or cleared by the [place] FDA and  has been authorized for detection and/or diagnosis of SARS-CoV-2 by FDA under an Emergency Use Authorization (EUA). This EUA will remain  in effect (meaning this test can be used) for the duration of the COVID-19 declaration under Section 564(b)(1) of the Act, 21 U.S.C.section 360bbb-3(b)(1), unless the authorization is terminated  or revoked sooner.       Influenza A by PCR NEGATIVE NEGATIVE Final   Influenza B by PCR NEGATIVE NEGATIVE Final    Comment: (NOTE) The Xpert Xpress SARS-CoV-2/FLU/RSV plus assay is intended as an aid in the diagnosis of influenza from Nasopharyngeal swab specimens and should not be used as a sole basis for treatment. Nasal washings and aspirates are unacceptable for Xpert Xpress SARS-CoV-2/FLU/RSV testing.  Fact Sheet for Patients: BloggerCourse.com  Fact  Sheet for Healthcare Providers: SeriousBroker.it  This test is not yet approved or cleared by the [place] and has been authorized for detection and/or diagnosis of SARS-CoV-2 by FDA under an Emergency Use Authorization (EUA). This EUA will remain in effect (meaning this test can be used) for the duration of the COVID-19 declaration under Section 564(b)(1) of the Act, 21 U.S.C. section 360bbb-3(b)(1), unless the authorization is terminated or revoked.     Resp Syncytial Virus by PCR NEGATIVE NEGATIVE Final    Comment: (NOTE) Fact Sheet for Patients: BloggerCourse.com  Fact Sheet for Healthcare Providers: SeriousBroker.it  This test is not yet approved or cleared by the [place] FDA and has been authorized for detection and/or diagnosis of SARS-CoV-2 by FDA under an Emergency Use Authorization (EUA). This EUA will remain in effect (meaning this  test can be used) for the duration of the COVID-19 declaration under Section 564(b)(1) of the Act, 21 U.S.C. section 360bbb-3(b)(1), unless the authorization is terminated or revoked.  Performed at [facility], [address] [place], [place] [address]     Coagulation Studies: Recent Labs    08/28/22 1634  LABPROT 21.3*  INR 1.8*    Urinalysis: No results for input(s): "COLORURINE", "LABSPEC", "PHURINE", "GLUCOSEU", "HGBUR", "BILIRUBINUR", "KETONESUR", "PROTEINUR", "UROBILINOGEN", "NITRITE", "LEUKOCYTESUR" in the last 72 hours.  Invalid input(s): "APPERANCEUR"    Imaging: DG Chest 1 View  Result Date: 08/29/2022 CLINICAL DATA:  67 year old male with history of shortness of breath. EXAM: CHEST  1 VIEW COMPARISON:  Chest x-ray 08/28/2022. FINDINGS: Right internal jugular PermCath with tip terminating in the right atrium. Lung volumes are low. Cephalization of the pulmonary vasculature with indistinct interstitial markings. No confluent consolidative airspace disease. No definite pleural effusions. No pneumothorax. Heart size is mildly enlarged. The patient is rotated to the right on today's exam, resulting in distortion of the mediastinal contours and reduced diagnostic sensitivity and specificity for mediastinal pathology. IMPRESSION: 1. Cardiomegaly with pulmonary vascular congestion and evidence of mild interstitial pulmonary edema; findings concerning for fluid overload or congestive heart failure. Electronically Signed   By: Trudie Reed M.D.   On: 08/29/2022 06:34   CT ABDOMEN PELVIS WO CONTRAST  Result Date: 08/28/2022 CLINICAL DATA:  Lower abdominal pain. Nausea, vomiting, and diarrhea for 2 days. Dark stool. EXAM: CT ABDOMEN AND PELVIS WITHOUT CONTRAST TECHNIQUE: Multidetector CT imaging of the abdomen and pelvis was performed following the standard protocol without IV contrast. RADIATION DOSE REDUCTION: This exam was performed according to the departmental  dose-optimization program which includes automated exposure control, adjustment of the mA and/or kV according to patient size and/or use of iterative reconstruction technique. COMPARISON:  08/13/2021 FINDINGS: Lower chest: Small right pleural effusion. Hepatobiliary: No mass visualized on this unenhanced exam. Contrast noted in the gallbladder and common bile duct. No evidence of cholecystitis. Pancreas: Limited visualization due to peripancreatic fluid and lack of intravenous contrast. Spleen:  Within normal limits in size. Adrenals/Urinary tract: Residual contrast enhancement of both kidneys seen from previous IV contrast administration. No No evidence of mass or hydronephrosis. Small amount of contrast noted in urinary bladder. Stomach/Bowel: Limited visualization due to large amount of ascites. No evidence of dilated bowel loops. Vascular/Lymphatic: Limited by large amount of ascites. No definite lymphadenopathy identified. No evidence of abdominal aortic aneurysm. Reproductive:  Unremarkable. Other: Small bilateral inguinal hernias which contain ascites. Diffuse body wall edema. Musculoskeletal:  No suspicious bone lesions identified. IMPRESSION: Large amount of ascites, and anasarca. Ascites and lack of IV contrast  limits visualization of the internal organs. Small right pleural effusion. Small bilateral inguinal hernias, which contain ascites. Electronically Signed   By: Danae Orleans M.D.   On: 08/28/2022 17:55   DG Chest Port 1 View  Result Date: 08/28/2022 CLINICAL DATA:  Nausea and vomiting and diarrhea for 2 days. Dark stools. Patient is on dialysis EXAM: PORTABLE CHEST 1 VIEW semi upright COMPARISON:  X-ray 08/13/2021 FINDINGS: Enlarged cardiopericardial silhouette. Vascular congestion. No edema. No consolidation or effusion. Double-lumen large-bore right IJ catheter seen with tip along the right atrium. Overlapping cardiac leads. IMPRESSION: Right IJ line. Enlarged heart with some central vascular  congestion. Electronically Signed   By: Karen Kays M.D.   On: 08/28/2022 17:15     Medications:    sodium chloride     ceFEPime (MAXIPIME) IV Stopped (08/29/22 0013)   dextrose 5% lactated ringers 125 mL/hr at 08/29/22 1000   insulin 6 Units/hr (08/29/22 1000)   lactated ringers Stopped (08/29/22 0620)   potassium chloride 10 mEq (08/29/22 1104)   [START ON 08/30/2022] vancomycin      Chlorhexidine Gluconate Cloth  6 each Topical Q0600   heparin injection (subcutaneous)  5,000 Units Subcutaneous Q8H   insulin glargine-yfgn  20 Units Subcutaneous Daily   sodium chloride flush  3 mL Intravenous Q12H   sodium chloride, acetaminophen, dextrose, docusate sodium, heparin, ondansetron (ZOFRAN) IV, mouth rinse, polyethylene glycol, sodium chloride flush  Assessment/ Plan:  Shawn [name] is a 67 y.o.  male with end stage renal disease on hemodialysis, hypertension, diabetes mellitus type II, congestive heart failure, coronary artery disease, CVA, alcohol abuse, tobacco abuse and hypothyroidism who presents to [facility] on 08/28/2022 for Hyperkalemia [E87.5] DKA (diabetic ketoacidosis) ([name]) [E11.10] ESRD on dialysis ([name]) [N18.6, Z99.2] Diabetic ketoacidosis without coma associated with type 2 diabetes mellitus ([name]) [E11.10] Nausea and vomiting, unspecified vomiting type [R11.2] Found to have hyperkalemia and DKA.   [facility]KA MWF [facility] RIJ permcath 64kg  End Stage Renal Disease - schedule dialysis for tomorrow.   Hypotension:  - continue volume resuscitation. - hold blood pressure medications.    Diabetes mellitus type II with chronic kidney disease: admitted with DKA.  - DKA protocol - insulin gtt - IV fluids  Anemia with chronic kidney disease: macrocytic - - ESA with MWF scheduled HD treatments.   Hypokalemia: secondary to cellular shifting. Hyperkalemia on admission - IV potassium replacement.     LOS: 1 Sumire Halbleib 8/25/202411:44 AM

## 2022-08-29 NOTE — Progress Notes (Signed)
PHARMACY CONSULT NOTE  Pharmacy Consult for Electrolyte Monitoring and Replacement   Recent Labs: Potassium (mmol/L)  Date Value  08/29/2022 3.3 (L)  06/20/2013 4.3   Magnesium (mg/dL)  Date Value  16/10/9602 2.2  06/20/2013 1.5 (L)   Calcium (mg/dL)  Date Value  54/09/8117 8.2 (L)   Calcium, Total (mg/dL)  Date Value  14/78/2956 9.0   Albumin (g/dL)  Date Value  21/30/8657 3.1 (L)  07/14/2017 4.0  06/17/2013 3.2 (L)   Phosphorus (mg/dL)  Date Value  84/69/6295 9.4 (H)   Sodium (mmol/L)  Date Value  08/29/2022 131 (L)  07/14/2017 136  06/20/2013 135 (L)     Assessment: 67 y.o male with significant PMH of chronic HFrEF due to reported NICM, hypothyroidism, EtOH abuse, tobacco abuse, T2DM, HTN, CVA, ESRD on HD MWF, and anemia of chronic disease who presented to the ED with chief complaints of nausea, vomiting, diarrhea with dark stools x 2 days.   Goal of Therapy:  Electrolytes WNL  Plan:  ---10 mEq IV KCl x 1 ---recheck electrolytes in am  Lowella Bandy ,PharmD Clinical Pharmacist 08/29/2022 7:14 AM

## 2022-08-30 DIAGNOSIS — I5023 Acute on chronic systolic (congestive) heart failure: Secondary | ICD-10-CM

## 2022-08-30 DIAGNOSIS — N186 End stage renal disease: Secondary | ICD-10-CM | POA: Diagnosis not present

## 2022-08-30 DIAGNOSIS — E1110 Type 2 diabetes mellitus with ketoacidosis without coma: Secondary | ICD-10-CM | POA: Diagnosis not present

## 2022-08-30 DIAGNOSIS — R112 Nausea with vomiting, unspecified: Secondary | ICD-10-CM | POA: Diagnosis not present

## 2022-08-30 LAB — CBC
HCT: 26.6 % — ABNORMAL LOW (ref 39.0–52.0)
Hemoglobin: 9.1 g/dL — ABNORMAL LOW (ref 13.0–17.0)
MCH: 29.1 pg (ref 26.0–34.0)
MCHC: 34.2 g/dL (ref 30.0–36.0)
MCV: 85 fL (ref 80.0–100.0)
Platelets: 190 10*3/uL (ref 150–400)
RBC: 3.13 MIL/uL — ABNORMAL LOW (ref 4.22–5.81)
RDW: 16.3 % — ABNORMAL HIGH (ref 11.5–15.5)
WBC: 6 10*3/uL (ref 4.0–10.5)
nRBC: 0.5 % — ABNORMAL HIGH (ref 0.0–0.2)

## 2022-08-30 LAB — RENAL FUNCTION PANEL
Albumin: 3 g/dL — ABNORMAL LOW (ref 3.5–5.0)
Anion gap: 15 (ref 5–15)
BUN: 76 mg/dL — ABNORMAL HIGH (ref 8–23)
CO2: 20 mmol/L — ABNORMAL LOW (ref 22–32)
Calcium: 8.2 mg/dL — ABNORMAL LOW (ref 8.9–10.3)
Chloride: 98 mmol/L (ref 98–111)
Creatinine, Ser: 5.68 mg/dL — ABNORMAL HIGH (ref 0.61–1.24)
GFR, Estimated: 10 mL/min — ABNORMAL LOW (ref >60)
Glucose, Bld: 73 mg/dL (ref 70–99)
Phosphorus: 4.3 mg/dL (ref 2.5–4.6)
Potassium: 3.7 mmol/L (ref 3.5–5.1)
Sodium: 133 mmol/L — ABNORMAL LOW (ref 135–145)

## 2022-08-30 LAB — GLUCOSE, CAPILLARY
Glucose-Capillary: 76 mg/dL (ref 70–99)
Glucose-Capillary: 87 mg/dL (ref 70–99)
Glucose-Capillary: 65 mg/dL — ABNORMAL LOW (ref 70–99)
Glucose-Capillary: 72 mg/dL (ref 70–99)
Glucose-Capillary: 101 mg/dL — ABNORMAL HIGH (ref 70–99)

## 2022-08-30 LAB — MAGNESIUM: Magnesium: 1.6 mg/dL — ABNORMAL LOW (ref 1.7–2.4)

## 2022-08-30 MED ORDER — MAGNESIUM SULFATE 2 GM/50ML IV SOLN
2.0000 g | Freq: Once | INTRAVENOUS | Status: AC
  Administered 2022-08-30: 2 g via INTRAVENOUS
  Filled 2022-08-30: qty 50

## 2022-08-30 MED ORDER — HEPARIN SODIUM (PORCINE) 1000 UNIT/ML IJ SOLN
INTRAMUSCULAR | Status: AC
  Filled 2022-08-30: qty 10

## 2022-08-30 MED ORDER — INSULIN GLARGINE-YFGN 100 UNIT/ML ~~LOC~~ SOLN
10.0000 [IU] | Freq: Every day | SUBCUTANEOUS | Status: DC
  Filled 2022-08-30: qty 0.1

## 2022-08-30 MED ORDER — INSULIN ASPART 100 UNIT/ML IJ SOLN: 0.0000 [IU] | Freq: Three times a day (TID) | INTRAMUSCULAR | Status: DC

## 2022-08-30 NOTE — Plan of Care (Signed)

## 2022-08-30 NOTE — Progress Notes (Signed)
The patient is transferred to 1 C room 126. No acute distress noted. Mary RN said she will  notify the family in the morning about the transfer.

## 2022-08-30 NOTE — Progress Notes (Signed)
Hemodialysis note  Received patient in bed to unit. Alert and oriented.  Informed consent signed and in chart.  Treatment initiated: 1004 Treatment completed: 1344  Patient tolerated well. Transported back to room, alert without acute distress.  Report given to patient's RN.   Access used: Right Chest HD Catheter Access issues: none  Total UF removed: 0 Medication(s) given:  none  Post HD weight: 66.3 kg   Wolfgang Phoenix [facility]

## 2022-08-30 NOTE — Progress Notes (Signed)
[facility]  - [place] [facility]   PATIENT NAME: Shawn [name]    MR#:  [id number]  DATE OF BIRTH:  03/03/1955  SUBJECTIVE:  no family at bedside. Seen at dialysis earlier. Quite sleepy although answered some questions appropriately. Tells me she lives with his sister and is bedbound. Able to get to the wheelchair. Goes to dialysis three times a week. No fever noted. Tolerated dialysis well.    VITALS:  Blood pressure 123/87, pulse 67, temperature 98.2 F (36.8 C), temperature source Oral, resp. rate 17, height 5\' 11"  (1.803 m), weight 66.3 kg, SpO2 97%.  PHYSICAL EXAMINATION:   GENERAL:  67 y.o.-year-old patient with no acute distress. 10 frail chronically ill LUNGS: Normal breath sounds bilaterally, no wheezing CARDIOVASCULAR: S1, S2 normal. No murmur   ABDOMEN: Soft, nontender, nondistended. Bowel sounds present.  EXTREMITIES: No  edema b/l.   HD access NEUROLOGIC: nonfocal  patient is  able to communicate however falls asleep at dialysis  LABORATORY PANEL:  CBC Recent Labs  Lab 08/30/22 0959  WBC 6.0  HGB 9.1*  HCT 26.6*  PLT 190    Chemistries  Recent Labs  Lab 08/29/22 0410 08/30/22 0511  NA 131* 133*  K 3.3* 3.7  CL 95* 98  CO2 22 20*  GLUCOSE 304* 73  BUN 59* 76*  CREATININE 4.21* 5.68*  CALCIUM 8.2* 8.2*  MG  --  1.6*  AST 23  --   ALT 11  --   ALKPHOS 84  --   BILITOT 0.8  --    Cardiac Enzymes No results for input(s): "TROPONINI" in the last 168 hours. RADIOLOGY:  DG Chest 1 View  Result Date: 08/29/2022 CLINICAL DATA:  67 year old male with history of shortness of breath. EXAM: CHEST  1 VIEW COMPARISON:  Chest x-ray 08/28/2022. FINDINGS: Right internal jugular PermCath with tip terminating in the right atrium. Lung volumes are low. Cephalization of the pulmonary vasculature with indistinct interstitial markings. No confluent consolidative airspace disease. No definite pleural effusions. No pneumothorax. Heart size is  mildly enlarged. The patient is rotated to the right on today's exam, resulting in distortion of the mediastinal contours and reduced diagnostic sensitivity and specificity for mediastinal pathology. IMPRESSION: 1. Cardiomegaly with pulmonary vascular congestion and evidence of mild interstitial pulmonary edema; findings concerning for fluid overload or congestive heart failure. Electronically Signed   By: Trudie Reed M.D.   On: 08/29/2022 06:34   CT ABDOMEN PELVIS WO CONTRAST  Result Date: 08/28/2022 CLINICAL DATA:  Lower abdominal pain. Nausea, vomiting, and diarrhea for 2 days. Dark stool. EXAM: CT ABDOMEN AND PELVIS WITHOUT CONTRAST TECHNIQUE: Multidetector CT imaging of the abdomen and pelvis was performed following the standard protocol without IV contrast. RADIATION DOSE REDUCTION: This exam was performed according to the departmental dose-optimization program which includes automated exposure control, adjustment of the mA and/or kV according to patient size and/or use of iterative reconstruction technique. COMPARISON:  08/13/2021 FINDINGS: Lower chest: Small right pleural effusion. Hepatobiliary: No mass visualized on this unenhanced exam. Contrast noted in the gallbladder and common bile duct. No evidence of cholecystitis. Pancreas: Limited visualization due to peripancreatic fluid and lack of intravenous contrast. Spleen:  Within normal limits in size. Adrenals/Urinary tract: Residual contrast enhancement of both kidneys seen from previous IV contrast administration. No No evidence of mass or hydronephrosis. Small amount of contrast noted in urinary bladder. Stomach/Bowel: Limited visualization due to large amount of ascites. No evidence of dilated bowel loops. Vascular/Lymphatic: Limited by large  amount of ascites. No definite lymphadenopathy identified. No evidence of abdominal aortic aneurysm. Reproductive:  Unremarkable. Other: Small bilateral inguinal hernias which contain ascites. Diffuse  body wall edema. Musculoskeletal:  No suspicious bone lesions identified. IMPRESSION: Large amount of ascites, and anasarca. Ascites and lack of IV contrast limits visualization of the internal organs. Small right pleural effusion. Small bilateral inguinal hernias, which contain ascites. Electronically Signed   By: Danae Orleans M.D.   On: 08/28/2022 17:55   DG Chest Port 1 View  Result Date: 08/28/2022 CLINICAL DATA:  Nausea and vomiting and diarrhea for 2 days. Dark stools. Patient is on dialysis EXAM: PORTABLE CHEST 1 VIEW semi upright COMPARISON:  X-ray 08/13/2021 FINDINGS: Enlarged cardiopericardial silhouette. Vascular congestion. No edema. No consolidation or effusion. Double-lumen large-bore right IJ catheter seen with tip along the right atrium. Overlapping cardiac leads. IMPRESSION: Right IJ line. Enlarged heart with some central vascular congestion. Electronically Signed   By: Karen Kays M.D.   On: 08/28/2022 17:15    Assessment and Plan Shawn [name] is a 67 y.o. male  PMH significant for chronic HFrEF due to reported NICM, hypothyroidism, EtOH abuse, tobacco abuse, T2DM, HTN, CVA, ESRD on HD MWF, and anemia of chronic disease who presented to the ED with chief complaints of nausea, vomiting, diarrhea with dark stools x 2 days.   Patient states for the past week he has had worsening nausea, vomiting, and diarrhea associated with poor p.o. intake. He admits to not taking his insulin has prescribed due to poor po intake.  He had his last hemodialysis (HD) session a day prior to admission.  Type II DM with DKA, probably due to missed insulin doses: -- DKA has resolved.  Anion gap is closed.   --Start insulin glargine 20 units daily.  -- Use NovoLog as needed for hyperglycemia. --Lactic acidosis likely due to DKA. --Last hemoglobin A1c on record was 11.9 on 06/24/2021.      ESRD with hyperkalemia: He underwent emergent hemodialysis on admission.  Hyperkalemia has  resolved. Hypokalemia: Potassium dropped to 3.3.  Defer treatment to nephrologist.   Acute on chronic diastolic CHF, anasarca (ascites, peripheral edema):  --Fluid management with hemodialysis.  Torsemide on hold.   Suspected sepsis of unknown cause: No evidence of infection at this time.  Suspect patient had SIRS from DKA.  --will d/c empiric IV antibiotics for now (on cefepime and vancomycin).    Acute metabolic encephalopathy: Probably from recent metabolic derangements.  Continue supportive care --impoving    Dark stools: Start IV Protonix.  Discontinue prophylactic subcutaneous heparin.  -- Use SCDs for DVT prophylaxis. --no overt GI bleed noted by staff   Acute on chronic anemia: Hemoglobin is down to 8.2.  Hemoglobin was 8.7 on admission.   Pseudohyponatremia: Improved   spoke with patient's sister Verlon Au on the phone. He lives with sister named [name]. No phone number available for Ms. Tasha. Spoke with Verlon Au patient is improving and if remains stable will discharge to home tomorrow. She is in agreement. At baseline patient does not ambulate. He does bed to wheelchair. Has transportation for hemodialysis per sister.  Procedures: in-house dialysis Family communication :sister leslie on the phone Consults : nephrology CODE STATUS: full DVT Prophylaxis : SCD Level of care: Med-Surg Status is: Inpatient Remains inpatient appropriate because: will continue to monitor for another day. If remains stable discharge to home tomorrow.    TOTAL TIME TAKING CARE OF THIS PATIENT: 35 minutes.  >50% time spent on counselling and  coordination of care  Note: This dictation was prepared with Dragon dictation along with smaller phrase technology. Any transcriptional errors that result from this process are unintentional.  Enedina Finner M.D    [facility]   CC: Primary care physician; Center, [facility]

## 2022-08-30 NOTE — Progress Notes (Signed)
[facility]  ROUNDING NOTE   Subjective:   [name] was admitted to [facility] on 08/28/2022 for Hyperkalemia [E87.5] DKA (diabetic ketoacidosis) ([name]) [E11.10] ESRD on dialysis ([name]) [N18.6, Z99.2] Diabetic ketoacidosis without coma associated with type 2 diabetes mellitus ([name]) [E11.10] Nausea and vomiting, unspecified vomiting type [R11.2]  Patient seen and evaluated during dialysis   HEMODIALYSIS FLOWSHEET:  Blood Flow Rate (mL/min): 400 mL/min Arterial Pressure (mmHg): -170 mmHg Venous Pressure (mmHg): 150 mmHg TMP (mmHg): 1 mmHg Ultrafiltration Rate (mL/min): 257 mL/min Dialysate Flow Rate (mL/min): 300 ml/min  Somnolent during treatment  Objective:  Vital signs in last 24 hours:  Temp:  [97.5 F (36.4 C)-98.7 F (37.1 C)] 97.6 F (36.4 C) (08/26 0945) Pulse Rate:  [35-71] 66 (08/26 1100) Resp:  [16-23] 16 (08/26 1100) BP: (85-114)/(56-83) 111/81 (08/26 1100) SpO2:  [90 %-100 %] 99 % (08/26 1100) Weight:  [66.4 kg-68 kg] 66.4 kg (08/26 0945)  Weight change: 4.496 kg Filed Weights   08/29/22 0209 08/30/22 0153 08/30/22 0945  Weight: 64.7 kg 68 kg 66.4 kg    Intake/Output: I/O last 3 completed shifts: In: 2919.5 [I.V.:2298.9; IV Piggyback:620.6] Out: 0    Intake/Output this shift:  No intake/output data recorded.  Physical Exam: General: NAD, ill appearing  Head: Normocephalic, atraumatic. Moist oral mucosal membranes  Eyes: Anicteric  Lungs:  Clear to auscultation  Heart: Regular rate and rhythm  Abdomen:  Soft, nontender  Extremities: no peripheral edema.  Neurologic: Somnolent, moving all four extremities  Skin: No lesions  Access: RIJ permcath    Basic Metabolic Panel: Recent Labs  Lab 08/28/22 1634 08/28/22 2011 08/29/22 0410 08/30/22 0511  NA 128* 129* 131* 133*  K 7.1* 6.1* 3.3* 3.7  CL 93* 94* 95* 98  CO2 <7* <7* 22 20*  GLUCOSE 1,034* 1,015* 304* 73  BUN 134* 135* 59* 76*  CREATININE 9.04* 9.25* 4.21* 5.68*   CALCIUM 7.8* 8.0* 8.2* 8.2*  MG  --   --   --  1.6*  PHOS  --  9.4*  --  4.3    Liver Function Tests: Recent Labs  Lab 08/28/22 1634 08/28/22 2011 08/29/22 0410 08/30/22 0511  AST 21  --  23  --   ALT 12  --  11  --   ALKPHOS 89  --  84  --   BILITOT 1.5*  --  0.8  --   PROT 7.0  --  6.6  --   ALBUMIN 3.4* 3.6 3.1* 3.0*   No results for input(s): "LIPASE", "AMYLASE" in the last 168 hours. No results for input(s): "AMMONIA" in the last 168 hours.  CBC: Recent Labs  Lab 08/28/22 1634 08/28/22 2011 08/28/22 2012 08/29/22 0407 08/30/22 0959  WBC 10.5 9.9 9.7 8.0 6.0  NEUTROABS 8.8*  --   --   --   --   HGB 8.7* 8.3* 8.4* 8.2* 9.1*  HCT 30.7* 27.8* 29.1* 23.9* 26.6*  MCV 101.0* 95.9 99.3 85.4 85.0  PLT 247 241 231 214 190    Cardiac Enzymes: No results for input(s): "CKTOTAL", "CKMB", "CKMBINDEX", "TROPONINI" in the last 168 hours.  BNP: Invalid input(s): "POCBNP"  CBG: Recent Labs  Lab 08/29/22 1418 08/29/22 1625 08/29/22 2212 08/30/22 0834 08/30/22 1122  GLUCAP 103* 84 76 76 87    Microbiology: Results for orders placed or performed during the hospital encounter of 08/28/22  Culture, blood (Routine x 2)     Status: None (Preliminary result)   Collection Time: 08/28/22  4:37 PM   Specimen: BLOOD  Result Value Ref Range Status   Specimen Description BLOOD BLOOD RIGHT ARM  Final   Special Requests   Final    BOTTLES DRAWN AEROBIC AND ANAEROBIC Blood Culture adequate volume   Culture   Final    NO GROWTH 2 DAYS Performed at [facility], [address], [place], [place] [address]    Report Status PENDING  Incomplete  Culture, blood (Routine x 2)     Status: None (Preliminary result)   Collection Time: 08/28/22  5:17 PM   Specimen: BLOOD  Result Value Ref Range Status   Specimen Description BLOOD BLOOD LEFT WRIST  Final   Special Requests   Final    AEROBIC BOTTLE ONLY Blood Culture results may not be optimal due to an inadequate volume  of blood received in culture bottles   Culture   Final    NO GROWTH 2 DAYS Performed at [facility], [address], [place], [place] [address]    Report Status PENDING  Incomplete  MRSA Next Gen by PCR, Nasal     Status: None   Collection Time: 08/28/22  8:30 PM   Specimen: Nasal Mucosa; Nasal Swab  Result Value Ref Range Status   MRSA by PCR Next Gen NOT DETECTED NOT DETECTED Final    Comment: (NOTE) The GeneXpert MRSA Assay (FDA approved for NASAL specimens only), is one component of a comprehensive MRSA colonization surveillance program. It is not intended to diagnose MRSA infection nor to guide or monitor treatment for MRSA infections. Test performance is not FDA approved in patients less than 55 years old. Performed at [facility], [address] [place], [place] [address]   Resp panel by RT-PCR (RSV, Flu A&B, Covid) Nasal Mucosa     Status: None   Collection Time: 08/28/22  9:19 PM   Specimen: Nasal Mucosa; Nasal Swab  Result Value Ref Range Status   SARS Coronavirus 2 by RT PCR NEGATIVE NEGATIVE Final    Comment: (NOTE) SARS-CoV-2 target nucleic acids are NOT DETECTED.  The SARS-CoV-2 RNA is generally detectable in upper respiratory specimens during the acute phase of infection. The lowest concentration of SARS-CoV-2 viral copies this assay can detect is 138 copies/mL. A negative result does not preclude SARS-Cov-2 infection and should not be used as the sole basis for treatment or other patient management decisions. A negative result may occur with  improper specimen collection/handling, submission of specimen other than nasopharyngeal swab, presence of viral mutation(s) within the areas targeted by this assay, and inadequate number of viral copies(<138 copies/mL). A negative result must be combined with clinical observations, patient history, and epidemiological information. The expected result is Negative.  Fact Sheet for Patients:   BloggerCourse.com  Fact Sheet for Healthcare Providers:  SeriousBroker.it  This test is no t yet approved or cleared by the [place] FDA and  has been authorized for detection and/or diagnosis of SARS-CoV-2 by FDA under an Emergency Use Authorization (EUA). This EUA will remain  in effect (meaning this test can be used) for the duration of the COVID-19 declaration under Section 564(b)(1) of the Act, 21 U.S.C.section 360bbb-3(b)(1), unless the authorization is terminated  or revoked sooner.       Influenza A by PCR NEGATIVE NEGATIVE Final   Influenza B by PCR NEGATIVE NEGATIVE Final    Comment: (NOTE) The Xpert Xpress SARS-CoV-2/FLU/RSV plus assay is intended as an aid in the diagnosis of influenza from Nasopharyngeal swab specimens  and should not be used as a sole basis for treatment. Nasal washings and aspirates are unacceptable for Xpert Xpress SARS-CoV-2/FLU/RSV testing.  Fact Sheet for Patients: BloggerCourse.com  Fact Sheet for Healthcare Providers: SeriousBroker.it  This test is not yet approved or cleared by the [place] FDA and has been authorized for detection and/or diagnosis of SARS-CoV-2 by FDA under an Emergency Use Authorization (EUA). This EUA will remain in effect (meaning this test can be used) for the duration of the COVID-19 declaration under Section 564(b)(1) of the Act, 21 U.S.C. section 360bbb-3(b)(1), unless the authorization is terminated or revoked.     Resp Syncytial Virus by PCR NEGATIVE NEGATIVE Final    Comment: (NOTE) Fact Sheet for Patients: BloggerCourse.com  Fact Sheet for Healthcare Providers: SeriousBroker.it  This test is not yet approved or cleared by the [place] FDA and has been authorized for detection and/or diagnosis of SARS-CoV-2 by FDA under an Emergency Use  Authorization (EUA). This EUA will remain in effect (meaning this test can be used) for the duration of the COVID-19 declaration under Section 564(b)(1) of the Act, 21 U.S.C. section 360bbb-3(b)(1), unless the authorization is terminated or revoked.  Performed at [facility], [address] [place], [place] [address]   C Difficile Quick Screen w PCR reflex     Status: None   Collection Time: 08/29/22  1:36 PM   Specimen: STOOL  Result Value Ref Range Status   C Diff antigen NEGATIVE NEGATIVE Final   C Diff toxin NEGATIVE NEGATIVE Final   C Diff interpretation No C. difficile detected.  Final    Comment: Performed at [facility], [address] [place], [place] [address]    Coagulation Studies: Recent Labs    08/28/22 1634  LABPROT 21.3*  INR 1.8*    Urinalysis: No results for input(s): "COLORURINE", "LABSPEC", "PHURINE", "GLUCOSEU", "HGBUR", "BILIRUBINUR", "KETONESUR", "PROTEINUR", "UROBILINOGEN", "NITRITE", "LEUKOCYTESUR" in the last 72 hours.  Invalid input(s): "APPERANCEUR"    Imaging: DG Chest 1 View  Result Date: 08/29/2022 CLINICAL DATA:  67 year old male with history of shortness of breath. EXAM: CHEST  1 VIEW COMPARISON:  Chest x-ray 08/28/2022. FINDINGS: Right internal jugular PermCath with tip terminating in the right atrium. Lung volumes are low. Cephalization of the pulmonary vasculature with indistinct interstitial markings. No confluent consolidative airspace disease. No definite pleural effusions. No pneumothorax. Heart size is mildly enlarged. The patient is rotated to the right on today's exam, resulting in distortion of the mediastinal contours and reduced diagnostic sensitivity and specificity for mediastinal pathology. IMPRESSION: 1. Cardiomegaly with pulmonary vascular congestion and evidence of mild interstitial pulmonary edema; findings concerning for fluid overload or congestive heart failure. Electronically Signed   By: Trudie Reed M.D.   On: 08/29/2022 06:34   CT ABDOMEN PELVIS WO CONTRAST  Result Date: 08/28/2022 CLINICAL DATA:  Lower abdominal pain. Nausea, vomiting, and diarrhea for 2 days. Dark stool. EXAM: CT ABDOMEN AND PELVIS WITHOUT CONTRAST TECHNIQUE: Multidetector CT imaging of the abdomen and pelvis was performed following the standard protocol without IV contrast. RADIATION DOSE REDUCTION: This exam was performed according to the departmental dose-optimization program which includes automated exposure control, adjustment of the mA and/or kV according to patient size and/or use of iterative reconstruction technique. COMPARISON:  08/13/2021 FINDINGS: Lower chest: Small right pleural effusion. Hepatobiliary: No mass visualized on this unenhanced exam. Contrast noted in the gallbladder and common bile duct. No evidence of cholecystitis. Pancreas: Limited visualization due to peripancreatic fluid and lack of  intravenous contrast. Spleen:  Within normal limits in size. Adrenals/Urinary tract: Residual contrast enhancement of both kidneys seen from previous IV contrast administration. No No evidence of mass or hydronephrosis. Small amount of contrast noted in urinary bladder. Stomach/Bowel: Limited visualization due to large amount of ascites. No evidence of dilated bowel loops. Vascular/Lymphatic: Limited by large amount of ascites. No definite lymphadenopathy identified. No evidence of abdominal aortic aneurysm. Reproductive:  Unremarkable. Other: Small bilateral inguinal hernias which contain ascites. Diffuse body wall edema. Musculoskeletal:  No suspicious bone lesions identified. IMPRESSION: Large amount of ascites, and anasarca. Ascites and lack of IV contrast limits visualization of the internal organs. Small right pleural effusion. Small bilateral inguinal hernias, which contain ascites. Electronically Signed   By: Danae Orleans M.D.   On: 08/28/2022 17:55   DG Chest Port 1 View  Result Date: 08/28/2022 CLINICAL  DATA:  Nausea and vomiting and diarrhea for 2 days. Dark stools. Patient is on dialysis EXAM: PORTABLE CHEST 1 VIEW semi upright COMPARISON:  X-ray 08/13/2021 FINDINGS: Enlarged cardiopericardial silhouette. Vascular congestion. No edema. No consolidation or effusion. Double-lumen large-bore right IJ catheter seen with tip along the right atrium. Overlapping cardiac leads. IMPRESSION: Right IJ line. Enlarged heart with some central vascular congestion. Electronically Signed   By: Karen Kays M.D.   On: 08/28/2022 17:15     Medications:    sodium chloride Stopped (08/30/22 0649)   magnesium sulfate bolus IVPB      Chlorhexidine Gluconate Cloth  6 each Topical Q0600   insulin aspart  0-5 Units Subcutaneous QHS   insulin aspart  0-9 Units Subcutaneous TID WC   insulin glargine-yfgn  20 Units Subcutaneous Daily   pantoprazole (PROTONIX) IV  40 mg Intravenous Q12H   sodium chloride flush  3 mL Intravenous Q12H   sodium chloride, acetaminophen, dextrose, docusate sodium, heparin, ondansetron (ZOFRAN) IV, mouth rinse, polyethylene glycol, sodium chloride flush  Assessment/ Plan:  [name] is a 67 y.o.  male with end stage renal disease on hemodialysis, hypertension, diabetes mellitus type II, congestive heart failure, coronary artery disease, CVA, alcohol abuse, tobacco abuse and hypothyroidism who presents to [facility] on 08/28/2022 for Hyperkalemia [E87.5] DKA (diabetic ketoacidosis) ([name]) [E11.10] ESRD on dialysis ([name]) [N18.6, Z99.2] Diabetic ketoacidosis without coma associated with type 2 diabetes mellitus ([name]) [E11.10] Nausea and vomiting, unspecified vomiting type [R11.2] Found to have hyperkalemia and DKA.   [facility] MWF [facility] RIJ permcath [phone]  End Stage Renal Disease - Receiving dialysis today, UF 0 - Will schedule next treatment on Wednesday  Hypotension due to suspected sepsis:  - continue volume resuscitation. - hold blood pressure medications.  -  Blood pressure 117/79 during dialysis   Diabetes mellitus type II with chronic kidney disease: admitted with DKA.  - DKA protocol - insulin gtt stopped yesterday  Anemia with chronic kidney disease: macrocytic - - ESA with MWF scheduled HD treatments.  - Hgb 9.1.  - Remains on Mircera at outpatient clinic  Hypokalemia: secondary to cellular shifting. Hyperkalemia on admission - Corrected with IV supplementation    LOS: 2 Elsye Mccollister 8/26/202411:28 AM

## 2022-08-30 NOTE — Progress Notes (Signed)
PHARMACY CONSULT NOTE  Pharmacy Consult for Electrolyte Monitoring and Replacement   Recent Labs: Potassium (mmol/L)  Date Value  08/30/2022 3.7  06/20/2013 4.3   Magnesium (mg/dL)  Date Value  40/98/1191 1.6 (L)  06/20/2013 1.5 (L)   Calcium (mg/dL)  Date Value  47/82/9562 8.2 (L)   Calcium, Total (mg/dL)  Date Value  13/08/6576 9.0   Albumin (g/dL)  Date Value  46/96/2952 3.0 (L)  07/14/2017 4.0  06/17/2013 3.2 (L)   Phosphorus (mg/dL)  Date Value  84/13/2440 4.3   Sodium (mmol/L)  Date Value  08/30/2022 133 (L)  07/14/2017 136  06/20/2013 135 (L)   Corrected calcium: 9.0 mg/dL  Assessment:  Shawn [name] is a 67 y.o male that presented with N/V/D with dark stools for 2 days. PMH is significant for chronic HFrEF due to reported NICM, hypothyroidism, EtOH abuse, tobacco abuse, T2DM, HTN, CVA, ESRD on HD MWF, and anemia of chronic disease. Patient was admitted for DKA likely due to insulin non-adherence and hyperkalemia. Pharmacy has been consulted for electrolyte management.   Goal of Therapy:  Electrolytes WNL  Plan:  -- Mg 1.6 - magnesium sulfate 2 gram IV x 1 -- Check BMP, Mg, Phos with AM labs  Littie Deeds, PharmD PGY1 Pharmacy Resident 08/30/2022 7:20 AM

## 2022-08-31 DIAGNOSIS — E1110 Type 2 diabetes mellitus with ketoacidosis without coma: Secondary | ICD-10-CM | POA: Diagnosis not present

## 2022-08-31 DIAGNOSIS — N186 End stage renal disease: Secondary | ICD-10-CM | POA: Diagnosis not present

## 2022-08-31 DIAGNOSIS — E875 Hyperkalemia: Secondary | ICD-10-CM | POA: Diagnosis not present

## 2022-08-31 DIAGNOSIS — I5023 Acute on chronic systolic (congestive) heart failure: Secondary | ICD-10-CM | POA: Diagnosis not present

## 2022-08-31 LAB — GLUCOSE, CAPILLARY
Glucose-Capillary: 60 mg/dL — ABNORMAL LOW (ref 70–99)
Glucose-Capillary: 93 mg/dL (ref 70–99)
Glucose-Capillary: 104 mg/dL — ABNORMAL HIGH (ref 70–99)

## 2022-08-31 LAB — BASIC METABOLIC PANEL
Anion gap: 10 (ref 5–15)
BUN: 42 mg/dL — ABNORMAL HIGH (ref 8–23)
CO2: 23 mmol/L (ref 22–32)
Calcium: 8.3 mg/dL — ABNORMAL LOW (ref 8.9–10.3)
Chloride: 99 mmol/L (ref 98–111)
Creatinine, Ser: 4.17 mg/dL — ABNORMAL HIGH (ref 0.61–1.24)
GFR, Estimated: 15 mL/min — ABNORMAL LOW (ref >60)
Glucose, Bld: 66 mg/dL — ABNORMAL LOW (ref 70–99)
Potassium: 3.7 mmol/L (ref 3.5–5.1)
Sodium: 132 mmol/L — ABNORMAL LOW (ref 135–145)

## 2022-08-31 LAB — CBC WITH DIFFERENTIAL/PLATELET
Abs Immature Granulocytes: 0.02 10*3/uL (ref 0.00–0.07)
Basophils Absolute: 0 10*3/uL (ref 0.0–0.1)
Basophils Relative: 1 %
Eosinophils Absolute: 0 10*3/uL (ref 0.0–0.5)
Eosinophils Relative: 1 %
HCT: 27.6 % — ABNORMAL LOW (ref 39.0–52.0)
Hemoglobin: 9.1 g/dL — ABNORMAL LOW (ref 13.0–17.0)
Immature Granulocytes: 1 %
Lymphocytes Relative: 22 %
Lymphs Abs: 0.8 10*3/uL (ref 0.7–4.0)
MCH: 28.8 pg (ref 26.0–34.0)
MCHC: 33 g/dL (ref 30.0–36.0)
MCV: 87.3 fL (ref 80.0–100.0)
Monocytes Absolute: 0.5 10*3/uL (ref 0.1–1.0)
Monocytes Relative: 12 %
Neutro Abs: 2.5 10*3/uL (ref 1.7–7.7)
Neutrophils Relative %: 63 %
Platelets: 148 10*3/uL — ABNORMAL LOW (ref 150–400)
RBC: 3.16 MIL/uL — ABNORMAL LOW (ref 4.22–5.81)
RDW: 16.4 % — ABNORMAL HIGH (ref 11.5–15.5)
WBC: 3.8 10*3/uL — ABNORMAL LOW (ref 4.0–10.5)
nRBC: 0 % (ref 0.0–0.2)

## 2022-08-31 LAB — HEMOGLOBIN A1C
Hgb A1c MFr Bld: 11 % — ABNORMAL HIGH (ref 4.8–5.6)
Mean Plasma Glucose: 269 mg/dL

## 2022-08-31 LAB — MAGNESIUM
Magnesium: 1.7 mg/dL (ref 1.7–2.4)
Magnesium: 1.8 mg/dL (ref 1.7–2.4)

## 2022-08-31 LAB — HEPATITIS B SURFACE ANTIBODY, QUANTITATIVE: Hep B S AB Quant (Post): 38.9 m[IU]/mL

## 2022-08-31 LAB — PHOSPHORUS: Phosphorus: 3.2 mg/dL (ref 2.5–4.6)

## 2022-08-31 MED ORDER — POLYETHYLENE GLYCOL 3350 17 G PO PACK
17.0000 g | PACK | Freq: Every day | ORAL | Status: DC
  Administered 2022-08-31: 17 g via ORAL
  Filled 2022-08-31: qty 1

## 2022-08-31 MED ORDER — INSULIN GLARGINE-YFGN 100 UNIT/ML ~~LOC~~ SOLN
5.0000 [IU] | Freq: Every day | SUBCUTANEOUS | Status: DC
  Administered 2022-08-31: 5 [IU] via SUBCUTANEOUS
  Filled 2022-08-31: qty 0.05

## 2022-08-31 MED ORDER — VITAMIN D 25 MCG (1000 UNIT) PO TABS
2000.0000 [IU] | ORAL_TABLET | Freq: Every day | ORAL | Status: DC
  Administered 2022-08-31: 2000 [IU] via ORAL
  Filled 2022-08-31: qty 2

## 2022-08-31 MED ORDER — PANTOPRAZOLE SODIUM 40 MG PO TBEC
40.0000 mg | DELAYED_RELEASE_TABLET | Freq: Every day | ORAL | Status: DC
  Administered 2022-08-31: 40 mg via ORAL
  Filled 2022-08-31: qty 1

## 2022-08-31 MED ORDER — ATORVASTATIN CALCIUM 20 MG PO TABS: 40.0000 mg | ORAL_TABLET | Freq: Every day | ORAL | Status: DC

## 2022-08-31 MED ORDER — INSULIN GLARGINE-YFGN 100 UNIT/ML ~~LOC~~ SOLN: 5.0000 [IU] | Freq: Every day | SUBCUTANEOUS | 11 refills | Status: DC

## 2022-08-31 MED ORDER — INSULIN GLARGINE-YFGN 100 UNIT/ML ~~LOC~~ SOLN
5.0000 [IU] | Freq: Every day | SUBCUTANEOUS | Status: DC
  Filled 2022-08-31: qty 0.05

## 2022-08-31 MED ORDER — LEVOTHYROXINE SODIUM 50 MCG PO TABS: 75.0000 ug | ORAL_TABLET | Freq: Every day | ORAL | Status: DC

## 2022-08-31 MED ORDER — GLUCERNA SHAKE PO LIQD
237.0000 mL | Freq: Three times a day (TID) | ORAL | Status: DC
  Administered 2022-08-31: 237 mL via ORAL

## 2022-08-31 MED ORDER — MAGNESIUM SULFATE 2 GM/50ML IV SOLN
2.0000 g | Freq: Once | INTRAVENOUS | Status: AC
  Administered 2022-08-31: 2 g via INTRAVENOUS
  Filled 2022-08-31: qty 50

## 2022-08-31 MED ORDER — ADULT MULTIVITAMIN W/MINERALS CH
1.0000 | ORAL_TABLET | Freq: Every day | ORAL | Status: DC
  Administered 2022-08-31: 1 via ORAL
  Filled 2022-08-31: qty 1

## 2022-08-31 MED ORDER — ASPIRIN 81 MG PO TBEC
81.0000 mg | DELAYED_RELEASE_TABLET | Freq: Every day | ORAL | Status: DC
  Administered 2022-08-31: 81 mg via ORAL
  Filled 2022-08-31: qty 1

## 2022-08-31 NOTE — Discharge Instructions (Signed)
Keep log of sugars at home and d/w PCP to adjust insulin dosing according to sugars

## 2022-08-31 NOTE — Progress Notes (Signed)
[facility]  ROUNDING NOTE   Subjective:   [name] was admitted to [facility] on 08/28/2022 for Hyperkalemia [E87.5] DKA (diabetic ketoacidosis) ([name]) [E11.10] ESRD on dialysis ([name]) [N18.6, Z99.2] Diabetic ketoacidosis without coma associated with type 2 diabetes mellitus ([name]) [E11.10] Nausea and vomiting, unspecified vomiting type [R11.2]  Patient seen laying in bed, covers over face States he feels well today No complaints to offer.   Objective:  Vital signs in last 24 hours:  Temp:  [97.5 F (36.4 C)-98.7 F (37.1 C)] 98.3 F (36.8 C) (08/27 0806) Pulse Rate:  [64-70] 64 (08/27 0806) Resp:  [14-20] 18 (08/27 0806) BP: (96-123)/(66-87) 96/66 (08/27 0806) SpO2:  [92 %-100 %] 98 % (08/27 0806) Weight:  [66.3 kg-68.2 kg] 68.2 kg (08/27 0500)  Weight change: -1.6 kg Filed Weights   08/30/22 0945 08/30/22 1344 08/31/22 0500  Weight: 66.4 kg 66.3 kg 68.2 kg    Intake/Output: I/O last 3 completed shifts: In: 295 [P.O.:120; IV Piggyback:175] Out: 0    Intake/Output this shift:  No intake/output data recorded.  Physical Exam: General: NAD, ill appearing  Head: Normocephalic, atraumatic. Moist oral mucosal membranes  Eyes: Anicteric  Lungs:  Clear to auscultation  Heart: Regular rate and rhythm  Abdomen:  Soft, nontender  Extremities: no peripheral edema.  Neurologic: Alert, moving all four extremities  Skin: No lesions  Access: RIJ permcath    Basic Metabolic Panel: Recent Labs  Lab 08/28/22 1634 08/28/22 2011 08/29/22 0410 08/30/22 0511 08/31/22 0546 08/31/22 0836  NA 128* 129* 131* 133* 132*  --   K 7.1* 6.1* 3.3* 3.7 3.7  --   CL 93* 94* 95* 98 99  --   CO2 <7* <7* 22 20* 23  --   GLUCOSE 1,034* 1,015* 304* 73 66*  --   BUN 134* 135* 59* 76* 42*  --   CREATININE 9.04* 9.25* 4.21* 5.68* 4.17*  --   CALCIUM 7.8* 8.0* 8.2* 8.2* 8.3*  --   MG  --   --   --  1.6* 1.7 1.8  PHOS  --  9.4*  --  4.3 3.2  --     Liver Function  Tests: Recent Labs  Lab 08/28/22 1634 08/28/22 2011 08/29/22 0410 08/30/22 0511  AST 21  --  23  --   ALT 12  --  11  --   ALKPHOS 89  --  84  --   BILITOT 1.5*  --  0.8  --   PROT 7.0  --  6.6  --   ALBUMIN 3.4* 3.6 3.1* 3.0*   No results for input(s): "LIPASE", "AMYLASE" in the last 168 hours. No results for input(s): "AMMONIA" in the last 168 hours.  CBC: Recent Labs  Lab 08/28/22 1634 08/28/22 2011 08/28/22 2012 08/29/22 0407 08/30/22 0959 08/31/22 0836  WBC 10.5 9.9 9.7 8.0 6.0 3.8*  NEUTROABS 8.8*  --   --   --   --  2.5  HGB 8.7* 8.3* 8.4* 8.2* 9.1* 9.1*  HCT 30.7* 27.8* 29.1* 23.9* 26.6* 27.6*  MCV 101.0* 95.9 99.3 85.4 85.0 87.3  PLT 247 241 231 214 190 148*    Cardiac Enzymes: No results for input(s): "CKTOTAL", "CKMB", "CKMBINDEX", "TROPONINI" in the last 168 hours.  BNP: Invalid input(s): "POCBNP"  CBG: Recent Labs  Lab 08/30/22 1122 08/30/22 1637 08/30/22 1650 08/30/22 2205 08/31/22 0801  GLUCAP 87 65* 72 101* 60*    Microbiology: Results for orders placed or performed during the hospital  encounter of 08/28/22  Culture, blood (Routine x 2)     Status: None (Preliminary result)   Collection Time: 08/28/22  4:37 PM   Specimen: BLOOD  Result Value Ref Range Status   Specimen Description BLOOD BLOOD RIGHT ARM  Final   Special Requests   Final    BOTTLES DRAWN AEROBIC AND ANAEROBIC Blood Culture adequate volume   Culture   Final    NO GROWTH 3 DAYS Performed at [facility], [address] [place], [place] [address]    Report Status PENDING  Incomplete  Culture, blood (Routine x 2)     Status: None (Preliminary result)   Collection Time: 08/28/22  5:17 PM   Specimen: BLOOD  Result Value Ref Range Status   Specimen Description BLOOD BLOOD LEFT WRIST  Final   Special Requests   Final    AEROBIC BOTTLE ONLY Blood Culture results may not be optimal due to an inadequate volume of blood received in culture bottles   Culture   Final     NO GROWTH 3 DAYS Performed at [facility], [address] [place], [place] [address]    Report Status PENDING  Incomplete  MRSA Next Gen by PCR, Nasal     Status: None   Collection Time: 08/28/22  8:30 PM   Specimen: Nasal Mucosa; Nasal Swab  Result Value Ref Range Status   MRSA by PCR Next Gen NOT DETECTED NOT DETECTED Final    Comment: (NOTE) The GeneXpert MRSA Assay (FDA approved for NASAL specimens only), is one component of a comprehensive MRSA colonization surveillance program. It is not intended to diagnose MRSA infection nor to guide or monitor treatment for MRSA infections. Test performance is not FDA approved in patients less than 45 years old. Performed at [facility], [address] [place], [place] [address]   Resp panel by RT-PCR (RSV, Flu A&B, Covid) Nasal Mucosa     Status: None   Collection Time: 08/28/22  9:19 PM   Specimen: Nasal Mucosa; Nasal Swab  Result Value Ref Range Status   SARS Coronavirus 2 by RT PCR NEGATIVE NEGATIVE Final    Comment: (NOTE) SARS-CoV-2 target nucleic acids are NOT DETECTED.  The SARS-CoV-2 RNA is generally detectable in upper respiratory specimens during the acute phase of infection. The lowest concentration of SARS-CoV-2 viral copies this assay can detect is 138 copies/mL. A negative result does not preclude SARS-Cov-2 infection and should not be used as the sole basis for treatment or other patient management decisions. A negative result may occur with  improper specimen collection/handling, submission of specimen other than nasopharyngeal swab, presence of viral mutation(s) within the areas targeted by this assay, and inadequate number of viral copies(<138 copies/mL). A negative result must be combined with clinical observations, patient history, and epidemiological information. The expected result is Negative.  Fact Sheet for Patients:  BloggerCourse.com  Fact Sheet for  Healthcare Providers:  SeriousBroker.it  This test is no t yet approved or cleared by the [place] FDA and  has been authorized for detection and/or diagnosis of SARS-CoV-2 by FDA under an Emergency Use Authorization (EUA). This EUA will remain  in effect (meaning this test can be used) for the duration of the COVID-19 declaration under Section 564(b)(1) of the Act, 21 U.S.C.section 360bbb-3(b)(1), unless the authorization is terminated  or revoked sooner.       Influenza A by PCR NEGATIVE NEGATIVE Final   Influenza B by PCR NEGATIVE NEGATIVE Final  Comment: (NOTE) The Xpert Xpress SARS-CoV-2/FLU/RSV plus assay is intended as an aid in the diagnosis of influenza from Nasopharyngeal swab specimens and should not be used as a sole basis for treatment. Nasal washings and aspirates are unacceptable for Xpert Xpress SARS-CoV-2/FLU/RSV testing.  Fact Sheet for Patients: BloggerCourse.com  Fact Sheet for Healthcare Providers: SeriousBroker.it  This test is not yet approved or cleared by the [place] FDA and has been authorized for detection and/or diagnosis of SARS-CoV-2 by FDA under an Emergency Use Authorization (EUA). This EUA will remain in effect (meaning this test can be used) for the duration of the COVID-19 declaration under Section 564(b)(1) of the Act, 21 U.S.C. section 360bbb-3(b)(1), unless the authorization is terminated or revoked.     Resp Syncytial Virus by PCR NEGATIVE NEGATIVE Final    Comment: (NOTE) Fact Sheet for Patients: BloggerCourse.com  Fact Sheet for Healthcare Providers: SeriousBroker.it  This test is not yet approved or cleared by the [place] FDA and has been authorized for detection and/or diagnosis of SARS-CoV-2 by FDA under an Emergency Use Authorization (EUA). This EUA will remain in effect (meaning this  test can be used) for the duration of the COVID-19 declaration under Section 564(b)(1) of the Act, 21 U.S.C. section 360bbb-3(b)(1), unless the authorization is terminated or revoked.  Performed at [facility], [address] [place], [place] [address]   C Difficile Quick Screen w PCR reflex     Status: None   Collection Time: 08/29/22  1:36 PM   Specimen: STOOL  Result Value Ref Range Status   C Diff antigen NEGATIVE NEGATIVE Final   C Diff toxin NEGATIVE NEGATIVE Final   C Diff interpretation No C. difficile detected.  Final    Comment: Performed at [facility], [address] [place], [place] [address]    Coagulation Studies: Recent Labs    08/28/22 1634  LABPROT 21.3*  INR 1.8*    Urinalysis: No results for input(s): "COLORURINE", "LABSPEC", "PHURINE", "GLUCOSEU", "HGBUR", "BILIRUBINUR", "KETONESUR", "PROTEINUR", "UROBILINOGEN", "NITRITE", "LEUKOCYTESUR" in the last 72 hours.  Invalid input(s): "APPERANCEUR"    Imaging: No results found.   Medications:    sodium chloride Stopped (08/30/22 0649)   magnesium sulfate bolus IVPB      aspirin EC  81 mg Oral Daily   atorvastatin  40 mg Oral QHS   Chlorhexidine Gluconate Cloth  6 each Topical Q0600   cholecalciferol  2,000 Units Oral Daily   feeding supplement (GLUCERNA SHAKE)  237 mL Oral TID BM   insulin aspart  0-5 Units Subcutaneous QHS   insulin aspart  0-6 Units Subcutaneous TID WC   insulin glargine-yfgn  5 Units Subcutaneous Q1200   [START ON 09/01/2022] levothyroxine  75 mcg Oral QAC breakfast   multivitamin with minerals  1 tablet Oral Daily   pantoprazole  40 mg Oral Daily   polyethylene glycol  17 g Oral Daily   sodium chloride flush  3 mL Intravenous Q12H   sodium chloride, acetaminophen, dextrose, docusate sodium, heparin, ondansetron (ZOFRAN) IV, mouth rinse, sodium chloride flush  Assessment/ Plan:  [name] is a 67 y.o.  male with end stage renal disease on  hemodialysis, hypertension, diabetes mellitus type II, congestive heart failure, coronary artery disease, CVA, alcohol abuse, tobacco abuse and hypothyroidism who presents to [facility] on 08/28/2022 for Hyperkalemia [E87.5] DKA (diabetic ketoacidosis) ([name]) [E11.10] ESRD on dialysis ([name]) [N18.6, Z99.2] Diabetic ketoacidosis without coma associated with type 2 diabetes mellitus ([name]) [E11.10] Nausea and  vomiting, unspecified vomiting type [R11.2] Found to have hyperkalemia and DKA.   [facility] RIJ permcath [phone]  End Stage Renal Disease - Dialysis received yesterday, no UF - Next treatment on Wednesday  Hypotension due to suspected sepsis:  - continue volume resuscitation. - hold blood pressure medications.  - Blood pressure 96/66   Diabetes mellitus type II with chronic kidney disease: admitted with DKA. Has been on insulin drip during this admission - DKA protocol   Anemia with chronic kidney disease: macrocytic - - ESA with MWF scheduled HD treatments.  - Hgb 9.1.  - Remains on Mircera at outpatient clinic  Hypokalemia: secondary to cellular shifting. Hyperkalemia on admission - Corrected    LOS: 3 Shawn [name] 8/27/202410:16 AM

## 2022-08-31 NOTE — Discharge Summary (Addendum)
Physician Discharge Summary   Patient: Shawn [name] MRN: [id number] DOB: 11/04/1955  Admit date:     08/28/2022  Discharge date: 08/31/22  Discharge Physician: Enedina Finner   PCP: [facility]   Recommendations at discharge:   keep log of sugars at home and discussed with PCP follow-up PCP in 1 to 2 weeks  Discharge Diagnoses: Active Problems:   Acute on chronic HFrEF (heart failure with reduced ejection fraction) ([name])   Hyperkalemia   Acute metabolic encephalopathy   DKA (diabetic ketoacidosis) ([name])   ESRD on dialysis ([name])   Nausea and vomiting  Shawn [name] is a 67 y.o. male  PMH significant for chronic HFrEF due to reported NICM, hypothyroidism, EtOH abuse, tobacco abuse, T2DM, HTN, CVA, ESRD on HD MWF, and anemia of chronic disease who presented to the ED with chief complaints of nausea, vomiting, diarrhea with dark stools x 2 days.   Patient states for the past week he has had worsening nausea, vomiting, and diarrhea associated with poor p.o. intake. He admits to not taking his insulin has prescribed due to poor po intake.  He had his last hemodialysis (HD) session a day prior to admission.   Type II DM with DKA, probably due to missed insulin doses: -- DKA has resolved.  Anion gap is closed.   --Start insulin glargine 20 units daily.  -- Use NovoLog as needed for hyperglycemia. --Lactic acidosis likely due to DKA. --Last hemoglobin A1c on record was 11.9 on 06/24/2021.   --A1c is still 11% -- Patient's sugar staining a bit on the lower side. Decreased insulin Lantus to five units daily. Patient encouraged to drink glucerna and eat appropriate diet at home. Keep log of sugar at home and adjust insulin accordingly. Patient voice understanding. This was also discussed with patient's sister Amieta    ESRD with hyperkalemia: He underwent emergent hemodialysis on admission.  Hyperkalemia has resolved. Hypokalemia: Potassium repleted to 3.7.    Hypomagnesimia repleted   Acute on chronic diastolic CHF, anasarca (ascites, peripheral edema):  --Fluid management with hemodialysis.  Torsemide resumed as before--d/w Dr Crissie Sickles --out pt nephrology to decide on dosing change if needed --pt on RA   Suspected sepsis of unknown cause: No evidence of infection at this time.  Suspect patient had SIRS from DKA.  --will d/c empiric IV antibiotics for now (on cefepime and vancomycin).    Acute metabolic encephalopathy: Probably from recent metabolic derangements.  Continue supportive care --impoving    Dark stools: Start IV Protonix--change to oral- -- Use SCDs for DVT prophylaxis. --no overt GI bleed noted by staff   Acute on chronic anemia: hgb 9.1 stable   Pseudohyponatremia: Improved   spoke with patient's sister [name] on the phone and explained overall progress for patient. Patient sister understands overall he has declined over last few months. He will follow-up with outpatient dialysis and PCP. Discussed about insulin dosing change and check for sugars at home.  Patient will benefit from outpatient palliative care. Will defer to PCP and BNR   Procedures: in-house dialysis Family communication :sister [name] on the phone Consults : nephrology CODE STATUS: full DVT Prophylaxis : SCD      Diet recommendation:  Discharge Diet Orders (From admission, onward)     Start     Ordered   08/31/22 0000  Diet - low sodium heart healthy        08/31/22 1111            DISCHARGE MEDICATION:  Allergies as of 08/31/2022       Reactions   Empagliflozin    Other Reaction(s): Acidosis   Pravastatin    Other reaction(s): Muscle pain   Simvastatin    Other reaction(s): Muscle pain        Medication List     STOP taking these medications    insulin glargine-yfgn 100 UNIT/ML Pen Commonly known as: SEMGLEE Replaced by: insulin glargine-yfgn 100 UNIT/ML injection   nicotine 21 mg/24hr patch Commonly known as: NICODERM CQ -  dosed in mg/24 hours   NovoLOG FlexPen 100 UNIT/ML FlexPen Generic drug: insulin aspart       TAKE these medications    acetaminophen 325 MG tablet Commonly known as: TYLENOL Take 2 tablets (650 mg total) by mouth every 6 (six) hours as needed for mild pain (or Fever >/= 101).   ammonium lactate 12 % lotion Commonly known as: LAC-HYDRIN Apply 1 Application topically as needed.   aspirin EC 81 MG tablet Take 81 mg by mouth daily.   atorvastatin 80 MG tablet Commonly known as: LIPITOR Take 40 mg by mouth at bedtime.   cholecalciferol 25 MCG (1000 UNIT) tablet Commonly known as: VITAMIN D3 Take 2,000 Units by mouth daily. Mon   dextrose 40 % Gel Commonly known as: GLUTOSE Take 1 Tube by mouth once as needed.   feeding supplement (GLUCERNA SHAKE) Liqd Take 237 mLs by mouth 3 (three) times daily between meals.   hydrOXYzine 10 MG tablet Commonly known as: ATARAX Take 10 mg by mouth 4 (four) times daily - after meals and at bedtime.   insulin glargine-yfgn 100 UNIT/ML injection Commonly known as: SEMGLEE Inject 0.05 mLs (5 Units total) into the skin daily at 12 noon. Replaces: insulin glargine-yfgn 100 UNIT/ML Pen   multivitamin with minerals Tabs tablet Take 1 tablet by mouth daily.   omeprazole 20 MG capsule Commonly known as: PRILOSEC Take 20 mg by mouth daily.   polyethylene glycol 17 g packet Commonly known as: MIRALAX / GLYCOLAX Take 17 g by mouth daily.   Synthroid 75 MCG tablet Generic drug: levothyroxine Take 75 mcg by mouth daily before breakfast.   torsemide 20 MG tablet Commonly known as: DEMADEX Take 60 mg by mouth as directed. TAKE THREE TABLETS BY MOUTH SUNDAY,TUESDAY,THURSDAY,SATURDAY FOR ACCUMULATION OF FLUID        Follow-up Information     [facility]. Schedule an appointment as soon as possible for a visit in 1 week(s).   Specialty: General Practice Why: pt's sister to make f/u appointment Contact information: [address] [place] [address] [phone]                Discharge Exam: Ceasar Mons Weights   08/30/22 0945 08/30/22 1344 08/31/22 0500  Weight: 66.4 kg 66.3 kg 68.2 kg   GENERAL:  67 y.o.-year-old patient with no acute distress. thin frail chronically ill LUNGS: Normal breath sounds bilaterally, no wheezing CARDIOVASCULAR: S1, S2 normal. No murmur   ABDOMEN: Soft, nontender, nondistended. Bowel sounds present.  EXTREMITIES: No  edema b/l.   HD access NEUROLOGIC: nonfocal  patient is awake and alert  Condition at discharge: fair  The results of significant diagnostics from this hospitalization (including imaging, microbiology, ancillary and laboratory) are listed below for reference.   Imaging Studies: DG Chest 1 View  Result Date: 08/29/2022 CLINICAL DATA:  67 year old male with history of shortness of breath. EXAM: CHEST  1 VIEW COMPARISON:  Chest x-ray 08/28/2022. FINDINGS: Right internal jugular PermCath  with tip terminating in the right atrium. Lung volumes are low. Cephalization of the pulmonary vasculature with indistinct interstitial markings. No confluent consolidative airspace disease. No definite pleural effusions. No pneumothorax. Heart size is mildly enlarged. The patient is rotated to the right on today's exam, resulting in distortion of the mediastinal contours and reduced diagnostic sensitivity and specificity for mediastinal pathology. IMPRESSION: 1. Cardiomegaly with pulmonary vascular congestion and evidence of mild interstitial pulmonary edema; findings concerning for fluid overload or congestive heart failure. Electronically Signed   By: Trudie Reed M.D.   On: 08/29/2022 06:34   CT ABDOMEN PELVIS WO CONTRAST  Result Date: 08/28/2022 CLINICAL DATA:  Lower abdominal pain. Nausea, vomiting, and diarrhea for 2 days. Dark stool. EXAM: CT ABDOMEN AND PELVIS WITHOUT CONTRAST TECHNIQUE: Multidetector CT imaging of the abdomen and pelvis was performed following the  standard protocol without IV contrast. RADIATION DOSE REDUCTION: This exam was performed according to the departmental dose-optimization program which includes automated exposure control, adjustment of the mA and/or kV according to patient size and/or use of iterative reconstruction technique. COMPARISON:  08/13/2021 FINDINGS: Lower chest: Small right pleural effusion. Hepatobiliary: No mass visualized on this unenhanced exam. Contrast noted in the gallbladder and common bile duct. No evidence of cholecystitis. Pancreas: Limited visualization due to peripancreatic fluid and lack of intravenous contrast. Spleen:  Within normal limits in size. Adrenals/Urinary tract: Residual contrast enhancement of both kidneys seen from previous IV contrast administration. No No evidence of mass or hydronephrosis. Small amount of contrast noted in urinary bladder. Stomach/Bowel: Limited visualization due to large amount of ascites. No evidence of dilated bowel loops. Vascular/Lymphatic: Limited by large amount of ascites. No definite lymphadenopathy identified. No evidence of abdominal aortic aneurysm. Reproductive:  Unremarkable. Other: Small bilateral inguinal hernias which contain ascites. Diffuse body wall edema. Musculoskeletal:  No suspicious bone lesions identified. IMPRESSION: Large amount of ascites, and anasarca. Ascites and lack of IV contrast limits visualization of the internal organs. Small right pleural effusion. Small bilateral inguinal hernias, which contain ascites. Electronically Signed   By: Danae Orleans M.D.   On: 08/28/2022 17:55   DG Chest Port 1 View  Result Date: 08/28/2022 CLINICAL DATA:  Nausea and vomiting and diarrhea for 2 days. Dark stools. Patient is on dialysis EXAM: PORTABLE CHEST 1 VIEW semi upright COMPARISON:  X-ray 08/13/2021 FINDINGS: Enlarged cardiopericardial silhouette. Vascular congestion. No edema. No consolidation or effusion. Double-lumen large-bore right IJ catheter seen with tip  along the right atrium. Overlapping cardiac leads. IMPRESSION: Right IJ line. Enlarged heart with some central vascular congestion. Electronically Signed   By: Karen Kays M.D.   On: 08/28/2022 17:15    Microbiology: Results for orders placed or performed during the hospital encounter of 08/28/22  Culture, blood (Routine x 2)     Status: None (Preliminary result)   Collection Time: 08/28/22  4:37 PM   Specimen: BLOOD  Result Value Ref Range Status   Specimen Description BLOOD BLOOD RIGHT ARM  Final   Special Requests   Final    BOTTLES DRAWN AEROBIC AND ANAEROBIC Blood Culture adequate volume   Culture   Final    NO GROWTH 3 DAYS Performed at [facility], [address] [place], [place] [address]    Report Status PENDING  Incomplete  Culture, blood (Routine x 2)     Status: None (Preliminary result)   Collection Time: 08/28/22  5:17 PM   Specimen: BLOOD  Result Value Ref Range Status   Specimen Description  BLOOD BLOOD LEFT WRIST  Final   Special Requests   Final    AEROBIC BOTTLE ONLY Blood Culture results may not be optimal due to an inadequate volume of blood received in culture bottles   Culture   Final    NO GROWTH 3 DAYS Performed at [facility], [address], [place], [place] [address]    Report Status PENDING  Incomplete  MRSA Next Gen by PCR, Nasal     Status: None   Collection Time: 08/28/22  8:30 PM   Specimen: Nasal Mucosa; Nasal Swab  Result Value Ref Range Status   MRSA by PCR Next Gen NOT DETECTED NOT DETECTED Final    Comment: (NOTE) The GeneXpert MRSA Assay (FDA approved for NASAL specimens only), is one component of a comprehensive MRSA colonization surveillance program. It is not intended to diagnose MRSA infection nor to guide or monitor treatment for MRSA infections. Test performance is not FDA approved in patients less than 70 years old. Performed at [facility], [address] [place], [place] [address]   Resp panel  by RT-PCR (RSV, Flu A&B, Covid) Nasal Mucosa     Status: None   Collection Time: 08/28/22  9:19 PM   Specimen: Nasal Mucosa; Nasal Swab  Result Value Ref Range Status   SARS Coronavirus 2 by RT PCR NEGATIVE NEGATIVE Final    Comment: (NOTE) SARS-CoV-2 target nucleic acids are NOT DETECTED.  The SARS-CoV-2 RNA is generally detectable in upper respiratory specimens during the acute phase of infection. The lowest concentration of SARS-CoV-2 viral copies this assay can detect is 138 copies/mL. A negative result does not preclude SARS-Cov-2 infection and should not be used as the sole basis for treatment or other patient management decisions. A negative result may occur with  improper specimen collection/handling, submission of specimen other than nasopharyngeal swab, presence of viral mutation(s) within the areas targeted by this assay, and inadequate number of viral copies(<138 copies/mL). A negative result must be combined with clinical observations, patient history, and epidemiological information. The expected result is Negative.  Fact Sheet for Patients:  BloggerCourse.com  Fact Sheet for Healthcare Providers:  SeriousBroker.it  This test is no t yet approved or cleared by the [place] FDA and  has been authorized for detection and/or diagnosis of SARS-CoV-2 by FDA under an Emergency Use Authorization (EUA). This EUA will remain  in effect (meaning this test can be used) for the duration of the COVID-19 declaration under Section 564(b)(1) of the Act, 21 U.S.C.section 360bbb-3(b)(1), unless the authorization is terminated  or revoked sooner.       Influenza A by PCR NEGATIVE NEGATIVE Final   Influenza B by PCR NEGATIVE NEGATIVE Final    Comment: (NOTE) The Xpert Xpress SARS-CoV-2/FLU/RSV plus assay is intended as an aid in the diagnosis of influenza from Nasopharyngeal swab specimens and should not be used as a sole basis  for treatment. Nasal washings and aspirates are unacceptable for Xpert Xpress SARS-CoV-2/FLU/RSV testing.  Fact Sheet for Patients: BloggerCourse.com  Fact Sheet for Healthcare Providers: SeriousBroker.it  This test is not yet approved or cleared by the [place] FDA and has been authorized for detection and/or diagnosis of SARS-CoV-2 by FDA under an Emergency Use Authorization (EUA). This EUA will remain in effect (meaning this test can be used) for the duration of the COVID-19 declaration under Section 564(b)(1) of the Act, 21 U.S.C. section 360bbb-3(b)(1), unless the authorization is terminated or revoked.     Resp Syncytial Virus  by PCR NEGATIVE NEGATIVE Final    Comment: (NOTE) Fact Sheet for Patients: BloggerCourse.com  Fact Sheet for Healthcare Providers: SeriousBroker.it  This test is not yet approved or cleared by the [place] FDA and has been authorized for detection and/or diagnosis of SARS-CoV-2 by FDA under an Emergency Use Authorization (EUA). This EUA will remain in effect (meaning this test can be used) for the duration of the COVID-19 declaration under Section 564(b)(1) of the Act, 21 U.S.C. section 360bbb-3(b)(1), unless the authorization is terminated or revoked.  Performed at [facility], [address] [place], [place] [address]   C Difficile Quick Screen w PCR reflex     Status: None   Collection Time: 08/29/22  1:36 PM   Specimen: STOOL  Result Value Ref Range Status   C Diff antigen NEGATIVE NEGATIVE Final   C Diff toxin NEGATIVE NEGATIVE Final   C Diff interpretation No C. difficile detected.  Final    Comment: Performed at [facility], [address] [place], [place] [address]    Labs: CBC: Recent Labs  Lab 08/28/22 1634 08/28/22 2011 08/28/22 2012 08/29/22 0407 08/30/22 0959 08/31/22 0836  WBC 10.5 9.9  9.7 8.0 6.0 3.8*  NEUTROABS 8.8*  --   --   --   --  2.5  HGB 8.7* 8.3* 8.4* 8.2* 9.1* 9.1*  HCT 30.7* 27.8* 29.1* 23.9* 26.6* 27.6*  MCV 101.0* 95.9 99.3 85.4 85.0 87.3  PLT 247 241 231 214 190 148*   Basic Metabolic Panel: Recent Labs  Lab 08/28/22 1634 08/28/22 2011 08/29/22 0410 08/30/22 0511 08/31/22 0546 08/31/22 0836  NA 128* 129* 131* 133* 132*  --   K 7.1* 6.1* 3.3* 3.7 3.7  --   CL 93* 94* 95* 98 99  --   CO2 <7* <7* 22 20* 23  --   GLUCOSE 1,034* 1,015* 304* 73 66*  --   BUN 134* 135* 59* 76* 42*  --   CREATININE 9.04* 9.25* 4.21* 5.68* 4.17*  --   CALCIUM 7.8* 8.0* 8.2* 8.2* 8.3*  --   MG  --   --   --  1.6* 1.7 1.8  PHOS  --  9.4*  --  4.3 3.2  --    Liver Function Tests: Recent Labs  Lab 08/28/22 1634 08/28/22 2011 08/29/22 0410 08/30/22 0511  AST 21  --  23  --   ALT 12  --  11  --   ALKPHOS 89  --  84  --   BILITOT 1.5*  --  0.8  --   PROT 7.0  --  6.6  --   ALBUMIN 3.4* 3.6 3.1* 3.0*   CBG: Recent Labs  Lab 08/30/22 1122 08/30/22 1637 08/30/22 1650 08/30/22 2205 08/31/22 0801  GLUCAP 87 65* 72 101* 60*    Discharge time spent: greater than 30 minutes.  Signed: Enedina Finner, MD [facility] 08/31/2022

## 2022-08-31 NOTE — Inpatient Diabetes Management (Signed)
Inpatient Diabetes Program Recommendations  AACE/ADA: New Consensus Statement on Inpatient Glycemic Control (2015)  Target Ranges:  Prepandial:   less than 140 mg/dL      Peak postprandial:   less than 180 mg/dL (1-2 hours)      Critically ill patients:  140 - 180 mg/dL   Lab Results  Component Value Date   GLUCAP 60 (L) 08/31/2022   HGBA1C 11.0 (H) 08/29/2022    Review of Glycemic Control  Latest Reference Range & Units 08/30/22 16:37 08/30/22 16:50 08/30/22 22:05 08/31/22 08:01  Glucose-Capillary 70 - 99 mg/dL 65 (L) 72 161 (H) 60 (L)   Diabetes history: DM  Outpatient Diabetes medications:  Novolog 6 units tid with meals  Semglee 20 units q HS Current orders for Inpatient glycemic control:  Novolog 0-6 units tid with meals and HS Semglee 5 units daily  Inpatient Diabetes Program Recommendations:    Agree with reduction in [place].  Will follow.   Thanks,  Beryl Meager, RN, BC-ADM Inpatient Diabetes Coordinator Pager [phone] (8a-5p)

## 2022-08-31 NOTE — TOC Transition Note (Signed)
Transition of Care [facility]) - CM/SW Discharge Note   Patient Details  Name: Shawn [name] MRN: [id number] Date of Birth: 1955-07-20  Transition of Care [facility] CM/SW Contact:  Garret Reddish, RN Phone Number: 08/31/2022, 4:07 PM   Clinical Narrative:   Chart reviewed.  Noted that patient has orders for discharge.   I have spoken with patient's sister Amerita.  She informs me that prior to admission patient lived with his other sister [name].  She informs me that patient is wheelchair bound.  She reports that [place] assist with meals.  Amerita reported that patient has in home aid service through the [place] for 2 hours per day.  She reports that the [place] added home health PT and removed some of the I home aid hours.  Amerita reported that [facility] is making a [facility] visit on  Thursday and she will be requesting the increased aid services.  She reports that prior to PT coming in to work with patient patient was receiving 20 hours a week of in home aid services.  Amerita reports that transportation for dialysis is provided by patient [facility] plan.  She schedules the rides for Mr. Ruderman.  Amerita also reports that she has applied for CAP services for Mr. Pennie.  Mr. Dovidio also has another supportive sister that assist with needs.  Amerita reports that she and her husband will transport patient after 5 pm today. Patient has no discharge needs.   I have informed staff nurse of the above information.     Final next level of care: Home/Self Care Barriers to Discharge: No Barriers Identified   Patient Goals and CMS Choice      Discharge Placement                    Name of family member notified: Amerita ( Patient's sister) Patient and family notified of of transfer: 08/31/22  Discharge Plan and Services Additional resources added to the After Visit Summary for                                       Social Determinants of Health (SDOH) Interventions SDOH  Screenings   Food Insecurity: Food Insecurity Present (08/24/2019)   Received from [facility], [facility]  Transportation Needs: No Transportation Needs (08/24/2019)   Received from [facility], [facility]  Financial Resource Strain: High Risk (08/24/2019)   Received from [facility], [facility]  Social Connections: Moderately Isolated (08/24/2019)   Received from [facility], [facility]  Stress: No Stress Concern Present (08/24/2019)   Received from [facility], [facility]  Tobacco Use: High Risk (05/06/2022)     Readmission Risk Interventions    06/24/2021    2:57 PM 01/08/2021    1:22 PM  Readmission Risk Prevention Plan  Transportation Screening Complete Complete  Social Work Consult for Recovery Care Planning/Counseling  Complete  Palliative Care Screening  Not Applicable  Medication Review Oceanographer) Complete Complete  Palliative Care Screening Not Applicable   Skilled Nursing Facility Not Applicable

## 2022-08-31 NOTE — Care Management Important Message (Signed)
Important Message  Patient Details  Name: Shawn [name] MRN: [id number] Date of Birth: 11/02/1955   Medicare Important Message Given:  N/A - LOS <3 / Initial given by admissions     Olegario Messier A Tira Lafferty 08/31/2022, 8:47 AM

## 2022-08-31 NOTE — Progress Notes (Signed)
PHARMACY CONSULT NOTE  Pharmacy Consult for Electrolyte Monitoring and Replacement   Recent Labs: Potassium (mmol/L)  Date Value  08/31/2022 3.7  06/20/2013 4.3   Magnesium (mg/dL)  Date Value  82/95/6213 1.7  06/20/2013 1.5 (L)   Calcium (mg/dL)  Date Value  08/65/7846 8.3 (L)   Calcium, Total (mg/dL)  Date Value  96/29/5284 9.0   Albumin (g/dL)  Date Value  13/24/4010 3.0 (L)  07/14/2017 4.0  06/17/2013 3.2 (L)   Phosphorus (mg/dL)  Date Value  27/25/3664 3.2   Sodium (mmol/L)  Date Value  08/31/2022 132 (L)  07/14/2017 136  06/20/2013 135 (L)   Corrected calcium: 9.1 mg/dL  Assessment:  Shawn [name] is a 67 y.o male that presented with N/V/D with dark stools for 2 days. PMH is significant for chronic HFrEF due to reported NICM, hypothyroidism, EtOH abuse, tobacco abuse, T2DM, HTN, CVA, ESRD on HD MWF, anemia of chronic disease, and pseudohyponatremia. Patient was admitted for DKA likely due to insulin non-adherence and hyperkalemia. Pharmacy has been consulted for electrolyte management.   Goal of Therapy:  Electrolytes WNL  Plan:  --Mg 1.7 - magnesium sulfate 2 gm IV x 1 --Check BMP, Mg, Phos with AM labs  Littie Deeds, PharmD PGY1 Pharmacy Resident 08/31/2022 6:07 AM

## 2022-09-02 LAB — CULTURE, BLOOD (ROUTINE X 2)
Culture: NO GROWTH
Culture: NO GROWTH
Special Requests: ADEQUATE

## 2022-09-03 ENCOUNTER — Other Ambulatory Visit: Payer: Self-pay

## 2022-09-03 ENCOUNTER — Emergency Department: Payer: 59

## 2022-09-03 ENCOUNTER — Inpatient Hospital Stay
Admission: EM | Admit: 2022-09-03 | Discharge: 2022-09-09 | DRG: 638 | Disposition: A | Discharge status: Home Health | Payer: 59 | Attending: Osteopathic Medicine | Admitting: Osteopathic Medicine

## 2022-09-03 DIAGNOSIS — N186 End stage renal disease: Secondary | ICD-10-CM | POA: Diagnosis present

## 2022-09-03 DIAGNOSIS — I10 Essential (primary) hypertension: Secondary | ICD-10-CM | POA: Diagnosis present

## 2022-09-03 DIAGNOSIS — Z79899 Other long term (current) drug therapy: Secondary | ICD-10-CM

## 2022-09-03 DIAGNOSIS — I5042 Chronic combined systolic (congestive) and diastolic (congestive) heart failure: Secondary | ICD-10-CM | POA: Diagnosis present

## 2022-09-03 DIAGNOSIS — R188 Other ascites: Secondary | ICD-10-CM | POA: Diagnosis not present

## 2022-09-03 DIAGNOSIS — Z8673 Personal history of transient ischemic attack (TIA), and cerebral infarction without residual deficits: Secondary | ICD-10-CM | POA: Diagnosis not present

## 2022-09-03 DIAGNOSIS — D509 Iron deficiency anemia, unspecified: Secondary | ICD-10-CM | POA: Diagnosis present

## 2022-09-03 DIAGNOSIS — E1165 Type 2 diabetes mellitus with hyperglycemia: Secondary | ICD-10-CM | POA: Diagnosis present

## 2022-09-03 DIAGNOSIS — R680 Hypothermia, not associated with low environmental temperature: Secondary | ICD-10-CM | POA: Diagnosis present

## 2022-09-03 DIAGNOSIS — A419 Sepsis, unspecified organism: Secondary | ICD-10-CM | POA: Diagnosis present

## 2022-09-03 DIAGNOSIS — R001 Bradycardia, unspecified: Secondary | ICD-10-CM | POA: Diagnosis present

## 2022-09-03 DIAGNOSIS — I2510 Atherosclerotic heart disease of native coronary artery without angina pectoris: Secondary | ICD-10-CM | POA: Diagnosis present

## 2022-09-03 DIAGNOSIS — E1122 Type 2 diabetes mellitus with diabetic chronic kidney disease: Secondary | ICD-10-CM | POA: Diagnosis present

## 2022-09-03 DIAGNOSIS — I5020 Unspecified systolic (congestive) heart failure: Secondary | ICD-10-CM

## 2022-09-03 DIAGNOSIS — Z79890 Hormone replacement therapy (postmenopausal): Secondary | ICD-10-CM | POA: Diagnosis not present

## 2022-09-03 DIAGNOSIS — E785 Hyperlipidemia, unspecified: Secondary | ICD-10-CM | POA: Diagnosis present

## 2022-09-03 DIAGNOSIS — E039 Hypothyroidism, unspecified: Secondary | ICD-10-CM | POA: Diagnosis present

## 2022-09-03 DIAGNOSIS — N390 Urinary tract infection, site not specified: Secondary | ICD-10-CM

## 2022-09-03 DIAGNOSIS — D72819 Decreased white blood cell count, unspecified: Secondary | ICD-10-CM | POA: Diagnosis present

## 2022-09-03 DIAGNOSIS — I428 Other cardiomyopathies: Secondary | ICD-10-CM | POA: Diagnosis present

## 2022-09-03 DIAGNOSIS — Z8249 Family history of ischemic heart disease and other diseases of the circulatory system: Secondary | ICD-10-CM

## 2022-09-03 DIAGNOSIS — R319 Hematuria, unspecified: Secondary | ICD-10-CM | POA: Diagnosis present

## 2022-09-03 DIAGNOSIS — Z7982 Long term (current) use of aspirin: Secondary | ICD-10-CM

## 2022-09-03 DIAGNOSIS — F17210 Nicotine dependence, cigarettes, uncomplicated: Secondary | ICD-10-CM | POA: Diagnosis present

## 2022-09-03 DIAGNOSIS — E162 Hypoglycemia, unspecified: Principal | ICD-10-CM

## 2022-09-03 DIAGNOSIS — D631 Anemia in chronic kidney disease: Secondary | ICD-10-CM | POA: Diagnosis present

## 2022-09-03 DIAGNOSIS — T68XXXA Hypothermia, initial encounter: Secondary | ICD-10-CM

## 2022-09-03 DIAGNOSIS — Z1152 Encounter for screening for COVID-19: Secondary | ICD-10-CM | POA: Diagnosis not present

## 2022-09-03 DIAGNOSIS — E11649 Type 2 diabetes mellitus with hypoglycemia without coma: Secondary | ICD-10-CM | POA: Diagnosis present

## 2022-09-03 DIAGNOSIS — I132 Hypertensive heart and chronic kidney disease with heart failure and with stage 5 chronic kidney disease, or end stage renal disease: Secondary | ICD-10-CM | POA: Diagnosis present

## 2022-09-03 DIAGNOSIS — Z6821 Body mass index (BMI) 21.0-21.9, adult: Secondary | ICD-10-CM

## 2022-09-03 DIAGNOSIS — Z888 Allergy status to other drugs, medicaments and biological substances status: Secondary | ICD-10-CM

## 2022-09-03 DIAGNOSIS — Z09 Encounter for follow-up examination after completed treatment for conditions other than malignant neoplasm: Secondary | ICD-10-CM

## 2022-09-03 DIAGNOSIS — N2581 Secondary hyperparathyroidism of renal origin: Secondary | ICD-10-CM | POA: Diagnosis present

## 2022-09-03 DIAGNOSIS — Z716 Tobacco abuse counseling: Secondary | ICD-10-CM | POA: Diagnosis not present

## 2022-09-03 DIAGNOSIS — R54 Age-related physical debility: Secondary | ICD-10-CM | POA: Diagnosis present

## 2022-09-03 DIAGNOSIS — R636 Underweight: Secondary | ICD-10-CM | POA: Diagnosis present

## 2022-09-03 DIAGNOSIS — Z833 Family history of diabetes mellitus: Secondary | ICD-10-CM

## 2022-09-03 DIAGNOSIS — R300 Dysuria: Secondary | ICD-10-CM | POA: Diagnosis present

## 2022-09-03 DIAGNOSIS — Z992 Dependence on renal dialysis: Secondary | ICD-10-CM

## 2022-09-03 DIAGNOSIS — F17200 Nicotine dependence, unspecified, uncomplicated: Secondary | ICD-10-CM | POA: Diagnosis present

## 2022-09-03 LAB — BASIC METABOLIC PANEL
Anion gap: 18 — ABNORMAL HIGH (ref 5–15)
BUN: 57 mg/dL — ABNORMAL HIGH (ref 8–23)
CO2: 24 mmol/L (ref 22–32)
Calcium: 8.9 mg/dL (ref 8.9–10.3)
Chloride: 92 mmol/L — ABNORMAL LOW (ref 98–111)
Creatinine, Ser: 6.45 mg/dL — ABNORMAL HIGH (ref 0.61–1.24)
GFR, Estimated: 9 mL/min — ABNORMAL LOW (ref >60)
Glucose, Bld: 114 mg/dL — ABNORMAL HIGH (ref 70–99)
Potassium: 4.3 mmol/L (ref 3.5–5.1)
Sodium: 134 mmol/L — ABNORMAL LOW (ref 135–145)

## 2022-09-03 LAB — URINALYSIS, ROUTINE W REFLEX MICROSCOPIC
Bilirubin Urine: NEGATIVE
Glucose, UA: 150 mg/dL — AB
Ketones, ur: NEGATIVE mg/dL
Nitrite: NEGATIVE
Protein, ur: >=300 mg/dL — AB
Specific Gravity, Urine: 1.019 (ref 1.005–1.030)
Squamous Epithelial / HPF: NONE SEEN /HPF (ref 0–5)
WBC, UA: >50 WBC/hpf (ref 0–5)
pH: 5 (ref 5.0–8.0)

## 2022-09-03 LAB — CBG MONITORING, ED
Glucose-Capillary: 106 mg/dL — ABNORMAL HIGH (ref 70–99)
Glucose-Capillary: 145 mg/dL — ABNORMAL HIGH (ref 70–99)
Glucose-Capillary: 151 mg/dL — ABNORMAL HIGH (ref 70–99)
Glucose-Capillary: 143 mg/dL — ABNORMAL HIGH (ref 70–99)
Glucose-Capillary: 135 mg/dL — ABNORMAL HIGH (ref 70–99)

## 2022-09-03 LAB — CBC WITH DIFFERENTIAL/PLATELET
Abs Immature Granulocytes: 0.02 10*3/uL (ref 0.00–0.07)
Basophils Absolute: 0 10*3/uL (ref 0.0–0.1)
Basophils Relative: 0 %
Eosinophils Absolute: 0.1 10*3/uL (ref 0.0–0.5)
Eosinophils Relative: 3 %
HCT: 29.7 % — ABNORMAL LOW (ref 39.0–52.0)
Hemoglobin: 9.9 g/dL — ABNORMAL LOW (ref 13.0–17.0)
Immature Granulocytes: 1 %
Lymphocytes Relative: 14 %
Lymphs Abs: 0.4 10*3/uL — ABNORMAL LOW (ref 0.7–4.0)
MCH: 29 pg (ref 26.0–34.0)
MCHC: 33.3 g/dL (ref 30.0–36.0)
MCV: 87.1 fL (ref 80.0–100.0)
Monocytes Absolute: 0.2 10*3/uL (ref 0.1–1.0)
Monocytes Relative: 6 %
Neutro Abs: 2.1 10*3/uL (ref 1.7–7.7)
Neutrophils Relative %: 76 %
Platelets: 170 10*3/uL (ref 150–400)
RBC: 3.41 MIL/uL — ABNORMAL LOW (ref 4.22–5.81)
RDW: 16.1 % — ABNORMAL HIGH (ref 11.5–15.5)
WBC: 2.8 10*3/uL — ABNORMAL LOW (ref 4.0–10.5)
nRBC: 0 % (ref 0.0–0.2)

## 2022-09-03 LAB — CORTISOL: Cortisol, Plasma: 14.9 ug/dL

## 2022-09-03 LAB — CK: Total CK: 89 U/L (ref 49–397)

## 2022-09-03 LAB — LACTIC ACID, PLASMA
Lactic Acid, Venous: 1.2 mmol/L (ref 0.5–1.9)
Lactic Acid, Venous: 0.9 mmol/L (ref 0.5–1.9)
Lactic Acid, Venous: 1 mmol/L (ref 0.5–1.9)

## 2022-09-03 LAB — GLUCOSE, CAPILLARY
Glucose-Capillary: 130 mg/dL — ABNORMAL HIGH (ref 70–99)
Glucose-Capillary: 140 mg/dL — ABNORMAL HIGH (ref 70–99)
Glucose-Capillary: 157 mg/dL — ABNORMAL HIGH (ref 70–99)
Glucose-Capillary: 162 mg/dL — ABNORMAL HIGH (ref 70–99)

## 2022-09-03 LAB — MAGNESIUM: Magnesium: 2.4 mg/dL (ref 1.7–2.4)

## 2022-09-03 LAB — SARS CORONAVIRUS 2 BY RT PCR: SARS Coronavirus 2 by RT PCR: NEGATIVE

## 2022-09-03 LAB — BRAIN NATRIURETIC PEPTIDE: B Natriuretic Peptide: >4500 pg/mL — ABNORMAL HIGH (ref 0.0–100.0)

## 2022-09-03 LAB — PROCALCITONIN: Procalcitonin: 0.56 ng/mL

## 2022-09-03 LAB — HEPATITIS B SURFACE ANTIGEN: Hepatitis B Surface Ag: NONREACTIVE

## 2022-09-03 LAB — TSH: TSH: 7.139 u[IU]/mL — ABNORMAL HIGH (ref 0.350–4.500)

## 2022-09-03 LAB — T4, FREE: Free T4: 0.9 ng/dL (ref 0.61–1.12)

## 2022-09-03 MED ORDER — HEPARIN SODIUM (PORCINE) 1000 UNIT/ML DIALYSIS
1000.0000 [IU] | INTRAMUSCULAR | Status: DC | PRN
  Administered 2022-09-05: 3600 [IU]
  Administered 2022-09-06: 1000 [IU]
  Administered 2022-09-08: 3600 [IU]
  Filled 2022-09-03: qty 1
  Filled 2022-09-03: qty 4

## 2022-09-03 MED ORDER — LEVOTHYROXINE SODIUM 50 MCG PO TABS
75.0000 ug | ORAL_TABLET | Freq: Every day | ORAL | Status: DC
  Administered 2022-09-04 – 2022-09-09 (×6): 75 ug via ORAL
  Filled 2022-09-03: qty 2
  Filled 2022-09-03 (×2): qty 1
  Filled 2022-09-03 (×3): qty 2

## 2022-09-03 MED ORDER — CHLORHEXIDINE GLUCONATE CLOTH 2 % EX PADS
6.0000 | MEDICATED_PAD | Freq: Every day | CUTANEOUS | Status: DC
  Administered 2022-09-04 – 2022-09-09 (×6): 6 via TOPICAL
  Filled 2022-09-03: qty 6

## 2022-09-03 MED ORDER — SODIUM CHLORIDE 0.9 % IV BOLUS (SEPSIS): 500.0000 mL | Freq: Once | INTRAVENOUS | Status: DC

## 2022-09-03 MED ORDER — ATORVASTATIN CALCIUM 20 MG PO TABS
40.0000 mg | ORAL_TABLET | Freq: Every day | ORAL | Status: DC
  Administered 2022-09-03 – 2022-09-08 (×6): 40 mg via ORAL
  Filled 2022-09-03 (×6): qty 2

## 2022-09-03 MED ORDER — SODIUM CHLORIDE 0.9 % IV SOLN: 250.0000 mL | INTRAVENOUS | Status: DC | PRN

## 2022-09-03 MED ORDER — PANTOPRAZOLE SODIUM 40 MG PO TBEC
40.0000 mg | DELAYED_RELEASE_TABLET | Freq: Every day | ORAL | Status: DC
  Administered 2022-09-03 – 2022-09-09 (×7): 40 mg via ORAL
  Filled 2022-09-03 (×7): qty 1

## 2022-09-03 MED ORDER — ALTEPLASE 2 MG IJ SOLR: 2.0000 mg | Freq: Once | INTRAMUSCULAR | Status: DC | PRN

## 2022-09-03 MED ORDER — ACETAMINOPHEN 325 MG PO TABS: 650.0000 mg | ORAL_TABLET | Freq: Four times a day (QID) | ORAL | Status: DC | PRN

## 2022-09-03 MED ORDER — SODIUM CHLORIDE 0.9 % IV SOLN
1.0000 g | INTRAVENOUS | Status: DC
  Administered 2022-09-04: 1 g via INTRAVENOUS
  Filled 2022-09-03: qty 10

## 2022-09-03 MED ORDER — SODIUM CHLORIDE 0.9% FLUSH: 3.0000 mL | INTRAVENOUS | Status: DC | PRN

## 2022-09-03 MED ORDER — GLUCOSE 40 % PO GEL: 2.0000 | ORAL | Status: DC

## 2022-09-03 MED ORDER — MIDODRINE HCL 5 MG PO TABS
5.0000 mg | ORAL_TABLET | Freq: Once | ORAL | Status: AC
  Administered 2022-09-03: 5 mg via ORAL
  Filled 2022-09-03: qty 1

## 2022-09-03 MED ORDER — GLUCOSE 40 % PO GEL: 1.0000 | ORAL | Status: DC | PRN

## 2022-09-03 MED ORDER — NICOTINE 7 MG/24HR TD PT24
7.0000 mg | MEDICATED_PATCH | Freq: Every day | TRANSDERMAL | Status: DC
  Administered 2022-09-03 – 2022-09-09 (×7): 7 mg via TRANSDERMAL
  Filled 2022-09-03 (×7): qty 1

## 2022-09-03 MED ORDER — SODIUM CHLORIDE 0.9% FLUSH
3.0000 mL | Freq: Two times a day (BID) | INTRAVENOUS | Status: DC
  Administered 2022-09-03 – 2022-09-09 (×12): 3 mL via INTRAVENOUS

## 2022-09-03 MED ORDER — ASPIRIN 81 MG PO TBEC
81.0000 mg | DELAYED_RELEASE_TABLET | Freq: Every day | ORAL | Status: DC
  Administered 2022-09-03 – 2022-09-09 (×7): 81 mg via ORAL
  Filled 2022-09-03 (×7): qty 1

## 2022-09-03 MED ORDER — HEPARIN SODIUM (PORCINE) 5000 UNIT/ML IJ SOLN
5000.0000 [IU] | Freq: Three times a day (TID) | INTRAMUSCULAR | Status: DC
  Administered 2022-09-03 – 2022-09-08 (×9): 5000 [IU] via SUBCUTANEOUS
  Filled 2022-09-03 (×11): qty 1

## 2022-09-03 MED ORDER — VANCOMYCIN HCL 1500 MG/300ML IV SOLN
1500.0000 mg | Freq: Once | INTRAVENOUS | Status: AC
  Administered 2022-09-03: 1500 mg via INTRAVENOUS
  Filled 2022-09-03: qty 300

## 2022-09-03 MED ORDER — SODIUM CHLORIDE 0.9 % IV SOLN
2.0000 g | Freq: Once | INTRAVENOUS | Status: AC
  Administered 2022-09-03: 2 g via INTRAVENOUS
  Filled 2022-09-03: qty 12.5

## 2022-09-03 NOTE — Assessment & Plan Note (Signed)
Tmax 90-92, improving with bear hugger Suspect likely multifactorial with primary care contribution of hypoglycemia Sepsis also on differential diagnosis given urinary symptoms Within normal limits Treat per sepsis protocol Correct hypoglycemia  Add on TSH and cortisol to correlate Follow closely

## 2022-09-03 NOTE — Assessment & Plan Note (Signed)
Cont statin

## 2022-09-03 NOTE — ED Notes (Signed)
Pt had small loose BM. This RN assisted pt into gown and new linen.

## 2022-09-03 NOTE — H&P (Signed)
History and Physical    Patient: Shawn [name] ZOX:[id number] DOB: 10/18/1955 DOA: 09/03/2022 DOS: the patient was seen and examined on 09/03/2022 PCP: [facility]  Patient coming from: Home  Chief Complaint:  Chief Complaint  Patient presents with   Hypoglycemia    Pt states his sugar was 135 before bed and took 3 units of insulin. Pt states he woke up not feeling well and called 911. Ems states pts blood sugar 26 on the truck. Pt is a and ox4 at this time.    HPI: Shawn [name] is a 67 y.o. male with medical history significant of HFpEF, ESRD on hemodialysis Monday Wednesday Friday, hypertension, type 2 diabetes, substance abuse, history of CVA presenting with sepsis, hypothermia, hypoglycemia.  Patient reports taking nighttime insulin coverage last night.  Patient states he woke up with severe chills with noted blood sugar in the 20s.  Noted admission August 24 through August 27 for issues including DKA associated with missed insulin dosing, hyperkalemia as well as anasarca.  Patient reports being otherwise stable from discharge.  No chest pain or shortness of breath.  No nausea or vomiting.  No hemiparesis or confusion.  1/4 pack/day smoker.  No reported alcohol use.  Still making urine.  Patient does report positive dysuria. Presented to ER Tmax 90.5, heart rate in 40s, respirations stable, blood pressure 90s to 100s over 60s.  Satting well on room air.  White count 2.8, hemoglobin 9.9, platelets 170, blood sugars 100s to 140s status post hypoglycemia protocol in the ER.  Urinalysis indicative of infection.  BNP >4500.  Chest is very frail and left lung opacity concerning for possible left pleural effusion.  Otherwise stable cardiomegaly and vascular congestion. Review of Systems: As mentioned in the history of present illness. All other systems reviewed and are negative. Past Medical History:  Diagnosis Date   Chronic combined systolic (congestive) and diastolic  (congestive) heart failure ([name])    a. TTE 6/15: EF < 20%, mildly dilated LV, DD, mildly dilated LA, mod dilated RA, mild MR, mild to mod TR, mod increased posterior wall thickness, elevated LA and LVEDP; b. 4/12019 Echo: EF 20-25%, diff HK. Gr1 DD, nl RV fxn.   CKD (chronic kidney disease), stage II    Diabetes mellitus with complication ([name])    a. Prior admissions w/ DKA (last 12/2017).   Essential hypertension    NICM (nonischemic cardiomyopathy) ([name])    a. 06/2013 Echo: EF<20%; b. 06/2013 Cath: no signif dzs; c. 04/2017 Echo: 20-25%, gr1 DD.; d.05/2018 Echo: 25-30%   Polysubstance abuse ([name])    a. etoh and tobacco   Stroke [facility])    Past Surgical History:  Procedure Laterality Date   CENTRAL LINE INSERTION N/A 06/29/2021   Procedure: CENTRAL LINE INSERTION;  Surgeon: Yvonne Kendall, MD;  Location: [facility] INVASIVE CV LAB;  Service: Cardiovascular;  Laterality: N/A;   NO PAST SURGERIES     RIGHT HEART CATH N/A 06/29/2021   Procedure: RIGHT HEART CATH;  Surgeon: Yvonne Kendall, MD;  Location: [facility] INVASIVE CV LAB;  Service: Cardiovascular;  Laterality: N/A;   Social History:  reports that he has been smoking cigarettes. He has never used smokeless tobacco. He reports that he does not currently use alcohol. He reports that he does not use drugs.  Allergies  Allergen Reactions   Empagliflozin     Other Reaction(s): Acidosis   Pravastatin     Other reaction(s): Muscle pain   Simvastatin     Other  reaction(s): Muscle pain    Family History  Problem Relation Age of Onset   Congestive Heart Failure Mother    Diabetes Mother    Congestive Heart Failure Brother     Prior to Admission medications   Medication Sig Start Date End Date Taking? Authorizing Provider  acetaminophen (TYLENOL) 325 MG tablet Take 2 tablets (650 mg total) by mouth every 6 (six) hours as needed for mild pain (or Fever >/= 101). 01/09/21  Yes Ghimire, Lyndel Safe, MD  ammonium lactate (LAC-HYDRIN) 12 % lotion Apply 1  Application topically as needed for dry skin.   Yes [provider]  aspirin EC 81 MG tablet Take 81 mg by mouth daily.   Yes [provider]  atorvastatin (LIPITOR) 80 MG tablet Take 40 mg by mouth at bedtime.   Yes [provider]  cholecalciferol (VITAMIN D) 25 MCG (1000 UNIT) tablet Take 2,000 Units by mouth daily.   Yes [provider]  dextrose (GLUTOSE) 40 % GEL Take 1 Tube by mouth once as needed for low blood sugar.   Yes [provider]  hydrOXYzine (ATARAX) 10 MG tablet Take 10 mg by mouth 4 (four) times daily - after meals and at bedtime.   Yes [provider]  insulin glargine-yfgn (SEMGLEE) 100 UNIT/ML injection Inject 0.05 mLs (5 Units total) into the skin daily at 12 noon. 08/31/22  Yes Enedina Finner, MD  levothyroxine (SYNTHROID) 75 MCG tablet Take 75 mcg by mouth daily before breakfast.   Yes [provider]  Multiple Vitamin (MULTIVITAMIN WITH MINERALS) TABS tablet Take 1 tablet by mouth daily. 01/10/21  Yes Dorcas Carrow, MD  omeprazole (PRILOSEC) 20 MG capsule Take 20 mg by mouth daily.   Yes [provider]  polyethylene glycol (MIRALAX / GLYCOLAX) 17 g packet Take 17 g by mouth daily.   Yes [provider]  torsemide (DEMADEX) 20 MG tablet Take 60 mg by mouth as directed. Take 3 tablets (60mg ) by mouth every Sunday, Tuesday, Thursday and Saturday   Yes [provider]  feeding supplement, GLUCERNA SHAKE, (GLUCERNA SHAKE) LIQD Take 237 mLs by mouth 3 (three) times daily between meals. 01/09/21   Dorcas Carrow, MD    Physical Exam: Vitals:   09/03/22 0645 09/03/22 0700 09/03/22 0830 09/03/22 0900  BP:  104/66  101/65  Pulse:  (!) 48    Resp:      Temp: (!) 92 F (33.3 C)     TempSrc: Rectal     SpO2:  98% 94%   Weight:      Height:       Physical Exam Constitutional:      Comments: Underweight    HENT:     Head: Normocephalic and atraumatic.     Nose: Nose normal.      Mouth/Throat:     Mouth: Mucous membranes are dry.  Cardiovascular:     Rate and Rhythm: Normal rate and regular rhythm.  Pulmonary:     Effort: Pulmonary effort is normal.  Abdominal:     General: Bowel sounds are normal.  Skin:    General: Skin is warm and dry.  Neurological:     General: No focal deficit present.  Psychiatric:        Mood and Affect: Mood normal.     Data Reviewed:  There are no new results to review at this time. DG Chest Portable 1 View CLINICAL DATA:  67 year old male with hypoglycemia, shortness of breath.  EXAM: PORTABLE CHEST  1 VIEW  COMPARISON:  Portable chest radiograph 08/29/2022 and earlier. CT Abdomen and Pelvis 08/28/2022.  FINDINGS: Portable AP upright view at 0521 hours. Mildly more rotated to the right. Stable right chest dual lumen dialysis type catheter. Stable cardiomegaly and mediastinal contours. Visualized tracheal air column is within normal limits. Veiling left lung opacity is new. No pneumothorax. No consolidation. Stable pulmonary vascularity, with diffuse vascular congestion.  No acute osseous abnormality identified. Paucity of bowel gas in the visible abdomen.  IMPRESSION: 1. Veiling left lung opacity suggesting new or increased left pleural effusion. 2. Otherwise stable Cardiomegaly with vascular congestion/interstitial edema.  Electronically Signed   By: Odessa Fleming M.D.   On: 09/03/2022 06:14  Lab Results  Component Value Date   WBC 2.8 (L) 09/03/2022   HGB 9.9 (L) 09/03/2022   HCT 29.7 (L) 09/03/2022   MCV 87.1 09/03/2022   PLT 170 09/03/2022   Last metabolic panel Lab Results  Component Value Date   GLUCOSE 114 (H) 09/03/2022   NA 134 (L) 09/03/2022   K 4.3 09/03/2022   CL 92 (L) 09/03/2022   CO2 24 09/03/2022   BUN 57 (H) 09/03/2022   CREATININE 6.45 (H) 09/03/2022   GFRNONAA 9 (L) 09/03/2022   CALCIUM 8.9 09/03/2022   PHOS 3.2 08/31/2022   PROT 6.6 08/29/2022   ALBUMIN 3.0 (L) 08/30/2022    BILITOT 0.8 08/29/2022   ALKPHOS 84 08/29/2022   AST 23 08/29/2022   ALT 11 08/29/2022   ANIONGAP 18 (H) 09/03/2022    Assessment and Plan: * Sepsis ([name]) Meeting sepsis criteria with Tmax of 90, white blood cell count 2.8 Concern for urinary source with positive dysuria and urinalysis indicative of infection IV cefepime for infectious coverage Pancultured Lactate reassuring at 1.2 Systolic blood pressure 90s to 100s Minimize IV fluids in the setting of ESRD Monitor closely    Hypothermia Tmax 90-92, improving with bear hugger Suspect likely multifactorial with primary care contribution of hypoglycemia Sepsis also on differential diagnosis given urinary symptoms Within normal limits Treat per sepsis protocol Correct hypoglycemia  Add on TSH and cortisol to correlate Follow closely  Hypoglycemia Blood sugar in 20s at home in the setting of excess insulin use and minimal p.o. intake Blood sugars now improved into the 100s status post hypoglycemia protocol Will continue to monitor closely Hold insulin for now Titrate as appropriate Noted admission 8/24 through 8/27 for hyperglycemia. Patient may benefit from formal diabetic education versus placement so as to avoid recurrent issues of labile blood sugars associated w/ insulin use.  Follow  Essential hypertension LLN BP  Hold BP regimen  Follow     Iron deficiency anemia Baseline hgb 8-10  Hgb 9.9 today  Monitor   Smoker 1/4 pack/day smoker Discussed cessation Nicotine patch  HLD (hyperlipidemia) Cont statin    HFrEF (heart failure with reduced ejection fraction) ([name]) 2D echo June 2023 with EF of 20 to 25% Looks fairly euvolemic to mildly dry at present Baseline volume status mediated by predominantly hemodialysis and Lasix in between sessions Strict ins and outs and daily weights Monitor volume status closely in the setting of sepsis   ESRD on dialysis [facility]) Baseline ESRD Monday Wednesday  Friday Due for hemodialysis today Consult nephrology      Advance Care Planning:   Code Status: Full Code   Consults: Nephrology for HD   Family Communication: No family at the bedside   Severity of Illness: The appropriate patient status for this patient is INPATIENT. Inpatient  status is judged to be reasonable and necessary in order to provide the required intensity of service to ensure the patient's safety. The patient's presenting symptoms, physical exam findings, and initial radiographic and laboratory data in the context of their chronic comorbidities is felt to place them at high risk for further clinical deterioration. Furthermore, it is not anticipated that the patient will be medically stable for discharge from the hospital within 2 midnights of admission.   * I certify that at the point of admission it is my clinical judgment that the patient will require inpatient hospital care spanning beyond 2 midnights from the point of admission due to high intensity of service, high risk for further deterioration and high frequency of surveillance required.*  Author: Floydene Flock, MD 09/03/2022 9:11 AM  For on call review [email or web address].

## 2022-09-03 NOTE — Assessment & Plan Note (Signed)
Meeting sepsis criteria with Tmax of 90, white blood cell count 2.8 Concern for urinary source with positive dysuria and urinalysis indicative of infection IV cefepime for infectious coverage Pancultured Lactate reassuring at 1.2 Systolic blood pressure 90s to 100s Minimize IV fluids in the setting of ESRD Monitor closely

## 2022-09-03 NOTE — Assessment & Plan Note (Signed)
2D echo June 2023 with EF of 20 to 25% Looks fairly euvolemic to mildly dry at present Baseline volume status mediated by predominantly hemodialysis and Lasix in between sessions Strict ins and outs and daily weights Monitor volume status closely in the setting of sepsis

## 2022-09-03 NOTE — ED Notes (Signed)
Finger blood sugar check 106

## 2022-09-03 NOTE — Inpatient Diabetes Management (Signed)
Inpatient Diabetes Program Recommendations  AACE/ADA: New Consensus Statement on Inpatient Glycemic Control (2015)  Target Ranges:  Prepandial:   less than 140 mg/dL      Peak postprandial:   less than 180 mg/dL (1-2 hours)      Critically ill patients:  140 - 180 mg/dL   Lab Results  Component Value Date   GLUCAP 143 (H) 09/03/2022   HGBA1C 11.0 (H) 08/29/2022    Review of Glycemic Control  Latest Reference Range & Units 09/03/22 04:28 09/03/22 05:39 09/03/22 06:29 09/03/22 07:46  Glucose-Capillary 70 - 99 mg/dL 564 (H) 332 (H) 951 (H) 143 (H)   Diabetes history: DM 2-ESRD Outpatient Diabetes medications:  Semglee 5 units daily (reduced during last hospitalization) Current orders for Inpatient glycemic control:  None yet Inpatient Diabetes Program Recommendations:    Note hypoglycemia.  Unclear what insulin patient took last night? Of note, insulin doses were reduced significantly during last admission for hyperglycemia? Agree with close monitoring of CBG's and continued follow-up regarding patients ability to care for self and diabetes.    Thanks,  Beryl Meager, RN, BC-ADM Inpatient Diabetes Coordinator Pager [phone]  (8a-5p)

## 2022-09-03 NOTE — ED Provider Notes (Signed)
[facility] Provider Note    Event Date/Time   First MD Initiated Contact with Patient 09/03/22 0404     (approximate)   History   Hypoglycemia (Pt states his sugar was 135 before bed and took 3 units of insulin. Pt states he woke up not feeling well and called 911. Ems states pts blood sugar 26 on the truck. Pt is a and ox4 at this time. )   HPI  Shawn [name] is a 67 y.o. male with history of hypertension, diabetes, end-stage renal disease on hemodialysis Monday Wednesday Friday, nonischemic cardiomyopathy who presents to the emergency department with hypoglycemia.  States his blood sugar was 135 before he went to bed and he took 3 units of Lantus.  States he woke up not feeling well and his blood sugar was 26 with EMS.  He has had some diarrhea but no vomiting.  Denies any pain.  States he took 20 units of his regular insulin yesterday morning.  Reports good oral intake.   History provided by patient and EMS.    Past Medical History:  Diagnosis Date   Chronic combined systolic (congestive) and diastolic (congestive) heart failure ([name])    a. TTE 6/15: EF < 20%, mildly dilated LV, DD, mildly dilated LA, mod dilated RA, mild MR, mild to mod TR, mod increased posterior wall thickness, elevated LA and LVEDP; b. 4/12019 Echo: EF 20-25%, diff HK. Gr1 DD, nl RV fxn.   CKD (chronic kidney disease), stage II    Diabetes mellitus with complication ([name])    a. Prior admissions w/ DKA (last 12/2017).   Essential hypertension    NICM (nonischemic cardiomyopathy) ([name])    a. 06/2013 Echo: EF<20%; b. 06/2013 Cath: no signif dzs; c. 04/2017 Echo: 20-25%, gr1 DD.; d.05/2018 Echo: 25-30%   Polysubstance abuse ([name])    a. etoh and tobacco   Stroke [facility])     Past Surgical History:  Procedure Laterality Date   CENTRAL LINE INSERTION N/A 06/29/2021   Procedure: CENTRAL LINE INSERTION;  Surgeon: Yvonne Kendall, MD;  Location: [facility] INVASIVE CV LAB;  Service:  Cardiovascular;  Laterality: N/A;   NO PAST SURGERIES     RIGHT HEART CATH N/A 06/29/2021   Procedure: RIGHT HEART CATH;  Surgeon: Yvonne Kendall, MD;  Location: [facility] INVASIVE CV LAB;  Service: Cardiovascular;  Laterality: N/A;    MEDICATIONS:  Prior to Admission medications   Medication Sig Start Date End Date Taking? Authorizing Provider  acetaminophen (TYLENOL) 325 MG tablet Take 2 tablets (650 mg total) by mouth every 6 (six) hours as needed for mild pain (or Fever >/= 101). 01/09/21   Dorcas Carrow, MD  ammonium lactate (LAC-HYDRIN) 12 % lotion Apply 1 Application topically as needed. 01/20/22   [provider]  aspirin EC 81 MG tablet Take 81 mg by mouth daily. 06/18/22   [provider]  atorvastatin (LIPITOR) 80 MG tablet Take 40 mg by mouth at bedtime. 01/20/22   [provider]  cholecalciferol (VITAMIN D) 25 MCG (1000 UNIT) tablet Take 2,000 Units by mouth daily. Mon    [provider]  dextrose (GLUTOSE) 40 % GEL Take 1 Tube by mouth once as needed. 01/20/22   [provider]  feeding supplement, GLUCERNA SHAKE, (GLUCERNA SHAKE) LIQD Take 237 mLs by mouth 3 (three) times daily between meals. 01/09/21   Dorcas Carrow, MD  hydrOXYzine (ATARAX) 10 MG tablet Take 10 mg by mouth 4 (four) times daily - after meals and  at bedtime. 02/18/21   [provider]  insulin glargine-yfgn (SEMGLEE) 100 UNIT/ML injection Inject 0.05 mLs (5 Units total) into the skin daily at 12 noon. 08/31/22   Enedina Finner, MD  levothyroxine (SYNTHROID) 75 MCG tablet Take 75 mcg by mouth daily before breakfast. 03/24/22   [provider]  Multiple Vitamin (MULTIVITAMIN WITH MINERALS) TABS tablet Take 1 tablet by mouth daily. 01/10/21   Dorcas Carrow, MD  omeprazole (PRILOSEC) 20 MG capsule Take 20 mg by mouth daily. 12/17/21   [provider]  polyethylene glycol (MIRALAX / GLYCOLAX) 17 g packet Take 17 g by mouth daily.    [provider]   torsemide (DEMADEX) 20 MG tablet Take 60 mg by mouth as directed. TAKE THREE TABLETS BY MOUTH SUNDAY,TUESDAY,THURSDAY,SATURDAY FOR ACCUMULATION OF FLUID 03/24/22   [provider]    Physical Exam   Triage Vital Signs: ED Triage Vitals  Encounter Vitals Group     BP 09/03/22 0428 116/79     Systolic BP Percentile --      Diastolic BP Percentile --      Pulse Rate 09/03/22 0414 (!) 50     Resp 09/03/22 0414 12     Temp 09/03/22 0442 (!) 90.5 F (32.5 C)     Temp Source 09/03/22 0442 Rectal     SpO2 09/03/22 0413 99 %     Weight 09/03/22 0417 155 lb 3.3 oz (70.4 kg)     Height 09/03/22 0417 5\' 11"  (1.803 m)     Head Circumference --      Peak Flow --      Pain Score 09/03/22 0417 0     Pain Loc --      Pain Education --      Exclude from Growth Chart --     Most recent vital signs: Vitals:   09/03/22 0640 09/03/22 0645  BP: 97/64   Pulse: (!) 44   Resp:    Temp:  (!) 92 F (33.3 C)  SpO2:      CONSTITUTIONAL: Alert, responds appropriately to questions.  Chronically ill-appearing, thin HEAD: Normocephalic, atraumatic EYES: Conjunctivae clear, pupils appear equal, sclera nonicteric ENT: normal nose; moist mucous membranes NECK: Supple, normal ROM CARD: RRR; S1 and S2 appreciated RESP: Normal chest excursion without splinting or tachypnea; breath sounds clear and equal bilaterally; no wheezes, no rhonchi, no rales, no hypoxia or respiratory distress, speaking full sentences ABD/GI: Non-distended; soft, non-tender, no rebound, no guarding, no peritoneal signs BACK: The back appears normal EXT: Normal ROM in all joints; no deformity noted, no edema SKIN: Normal color for age and race; warm; no rash on exposed skin NEURO: Moves all extremities equally, normal speech PSYCH: The patient's mood and manner are appropriate.   ED Results / Procedures / Treatments   LABS: (all labs ordered are listed, but only abnormal results are displayed) Labs Reviewed  CBC  WITH DIFFERENTIAL/PLATELET - Abnormal; Notable for the following components:      Result Value   WBC 2.8 (*)    RBC 3.41 (*)    Hemoglobin 9.9 (*)    HCT 29.7 (*)    RDW 16.1 (*)    Lymphs Abs 0.4 (*)    All other components within normal limits  BASIC METABOLIC PANEL - Abnormal; Notable for the following components:   Sodium 134 (*)    Chloride 92 (*)    Glucose, Bld 114 (*)    BUN 57 (*)    Creatinine, Ser 6.45 (*)  GFR, Estimated 9 (*)    Anion gap 18 (*)    All other components within normal limits  URINALYSIS, ROUTINE W REFLEX MICROSCOPIC - Abnormal; Notable for the following components:   Color, Urine YELLOW (*)    APPearance CLOUDY (*)    Glucose, UA 150 (*)    Hgb urine dipstick LARGE (*)    Protein, ur >=300 (*)    Leukocytes,Ua MODERATE (*)    Bacteria, UA RARE (*)    All other components within normal limits  TSH - Abnormal; Notable for the following components:   TSH 7.139 (*)    All other components within normal limits  BRAIN NATRIURETIC PEPTIDE - Abnormal; Notable for the following components:   B Natriuretic Peptide >4,500.0 (*)    All other components within normal limits  CBG MONITORING, ED - Abnormal; Notable for the following components:   Glucose-Capillary 106 (*)    All other components within normal limits  CBG MONITORING, ED - Abnormal; Notable for the following components:   Glucose-Capillary 151 (*)    All other components within normal limits  CBG MONITORING, ED - Abnormal; Notable for the following components:   Glucose-Capillary 145 (*)    All other components within normal limits  SARS CORONAVIRUS 2 BY RT PCR  CULTURE, BLOOD (ROUTINE X 2)  CULTURE, BLOOD (ROUTINE X 2)  URINE CULTURE  LACTIC ACID, PLASMA  PROCALCITONIN  T4, FREE  MAGNESIUM  LACTIC ACID, PLASMA  CBG MONITORING, ED  CBG MONITORING, ED  CBG MONITORING, ED  CBG MONITORING, ED  CBG MONITORING, ED  CBG MONITORING, ED  CBG MONITORING, ED  CBG MONITORING, ED  CBG  MONITORING, ED  CBG MONITORING, ED  CBG MONITORING, ED  CBG MONITORING, ED  CBG MONITORING, ED  CBG MONITORING, ED  CBG MONITORING, ED  CBG MONITORING, ED  CBG MONITORING, ED     EKG:  EKG Interpretation Date/Time:  Friday September 03 2022 05:29:23 EDT Ventricular Rate:  47 PR Interval:  188 QRS Duration:  127 QT Interval:  498 QTC Calculation: 441 R Axis:   47  Text Interpretation: Sinus bradycardia Inferior infarct, age indeterminate Probable anterior infarct, age indeterminate Confirmed by Rochele Raring [phone]) on 09/03/2022 5:30:49 AM         RADIOLOGY: My personal review and interpretation of imaging: This x-ray shows pleural effusion and interstitial edema.  I have personally reviewed all radiology reports.   DG Chest Portable 1 View  Result Date: 09/03/2022 CLINICAL DATA:  67 year old male with hypoglycemia, shortness of breath. EXAM: PORTABLE CHEST 1 VIEW COMPARISON:  Portable chest radiograph 08/29/2022 and earlier. CT Abdomen and Pelvis 08/28/2022. FINDINGS: Portable AP upright view at 0521 hours. Mildly more rotated to the right. Stable right chest dual lumen dialysis type catheter. Stable cardiomegaly and mediastinal contours. Visualized tracheal air column is within normal limits. Veiling left lung opacity is new. No pneumothorax. No consolidation. Stable pulmonary vascularity, with diffuse vascular congestion. No acute osseous abnormality identified. Paucity of bowel gas in the visible abdomen. IMPRESSION: 1. Veiling left lung opacity suggesting new or increased left pleural effusion. 2. Otherwise stable Cardiomegaly with vascular congestion/interstitial edema. Electronically Signed   By: Odessa Fleming M.D.   On: 09/03/2022 06:14     PROCEDURES:  Critical Care performed: Yes, see critical care procedure note(s)   CRITICAL CARE Performed by: Baxter Hire Sindi Beckworth   Total critical care time: 45 minutes  Critical care time was exclusive of separately billable procedures and  treating other patients.  Critical care was necessary to treat or prevent imminent or life-threatening deterioration.  Critical care was time spent personally by me on the following activities: development of treatment plan with patient and/or surrogate as well as nursing, discussions with consultants, evaluation of patient's response to treatment, examination of patient, obtaining history from patient or surrogate, ordering and performing treatments and interventions, ordering and review of laboratory studies, ordering and review of radiographic studies, pulse oximetry and re-evaluation of patient's condition.   Marland Kitchen1-3 Lead EKG Interpretation  Performed by: Skyy Nilan, Layla Maw, DO Authorized by: Tereka Thorley, Layla Maw, DO     Interpretation: abnormal     ECG rate:  50   ECG rate assessment: bradycardic     Rhythm: sinus bradycardia     Ectopy: none     Conduction: normal       IMPRESSION / MDM / ASSESSMENT AND PLAN / ED COURSE  I reviewed the triage vital signs and the nursing notes.    Patient here for hypoglycemia.  Found to be hypothermic as well.  The patient is on the cardiac monitor to evaluate for evidence of arrhythmia and/or significant heart rate changes.   DIFFERENTIAL DIAGNOSIS (includes but not limited to):   Hypoglycemia, possibly sepsis, pneumonia, UTI, hypothermia could be secondary to hypoglycemia, thyroid disease   Patient's presentation is most consistent with acute presentation with potential threat to life or bodily function.   PLAN: Will obtain labs, cultures, urine, chest x-ray.  Patient is bradycardic here.  This could be secondary to hypothermia as well.  Will continue to closely monitor.  Will allow patient to eat and drink.  Blood sugar currently 106.  Will place under Bair hugger for rectal temp of 90.5.   MEDICATIONS GIVEN IN ED: Medications  vancomycin (VANCOREADY) IVPB 1500 mg/300 mL (has no administration in time range)  ceFEPIme (MAXIPIME) 2 g in sodium  chloride 0.9 % 100 mL IVPB (2 g Intravenous New Bag/Given 09/03/22 0631)  midodrine (PROAMATINE) tablet 5 mg (5 mg Oral Given 09/03/22 2595)     ED COURSE: Patient's blood sugar is in the 140s.  He is still bradycardic.  No chest pain or shortness of breath.  Chest x-ray reviewed and interpreted by myself and the radiologist and shows interstitial edema and developing left-sided pleural effusion.  Given he is already volume overloaded, will avoid further IV fluids as he is due for dialysis today.  He has had some slightly low blood pressures but this was measured with a regular size cuff.  We have switched him to a small cuff and will continue to monitor and give midodrine as needed.  I do not feel at this time he needs pressors given maps greater than 65.  He does appear to have a urinary tract infection and given his leukopenia, he is getting broad-spectrum antibiotics.  Lactic is normal.  Procalcitonin minimally elevated which is near his baseline.  Again holding on 30 mL/kg IV fluid bolus given history of end-stage renal disease on hemodialysis due for dialysis today with signs of volume overload on imaging.  Will discuss with hospitalist for admission.   CONSULTS:  Consulted and discussed patient's case with hospitalist, Dr. Alvester Morin.  I have recommended admission and consulting physician agrees and will place admission orders.  Patient (and family if present) agree with this plan.   I reviewed all nursing notes, vitals, pertinent previous records.  All labs, EKGs, imaging ordered have been independently reviewed and interpreted by myself.    OUTSIDE RECORDS REVIEWED:  Reviewed recent admission notes for DKA.       FINAL CLINICAL IMPRESSION(S) / ED DIAGNOSES   Final diagnoses:  Hypoglycemia  Hypothermia, initial encounter  Sinus bradycardia  Acute UTI     Rx / DC Orders   ED Discharge Orders     None        Note:  This document was prepared using Dragon voice recognition  software and may include unintentional dictation errors.   Chrsitopher Wik, Layla Maw, DO 09/03/22 [phone]

## 2022-09-03 NOTE — Assessment & Plan Note (Signed)
LLN BP  Hold BP regimen  Follow

## 2022-09-03 NOTE — ED Notes (Signed)
Bear hugger and 2 warm blankets placed on pt

## 2022-09-03 NOTE — Consult Note (Signed)
Pharmacy Antibiotic Note  Shawn [name] is a 67 y.o. male admitted on 09/03/2022 with UTI.  Pharmacy has been consulted for Cefepime dosing. Tmax 92. Bradycardic  and hypotension. Procal 0.56. WBC 2.8 (trending down). Grew enterococcus faecalis in the urine culture in 01/2021. Patient received vancomycin x 1, on HD MWF.   Plan: Will start Cefepime 1 g q24H.   Height: 5\' 11"  (180.3 cm) Weight: 70.4 kg (155 lb 3.3 oz) IBW/kg (Calculated) : 75.3  Temp (24hrs), Avg:91.3 F (32.9 C), Min:90.5 F (32.5 C), Max:92 F (33.3 C)  Recent Labs  Lab 08/28/22 1634 08/28/22 2011 08/28/22 2012 08/29/22 0407 08/29/22 0410 08/30/22 0511 08/30/22 0959 08/31/22 0546 08/31/22 0836 09/03/22 0422 09/03/22 0543  WBC 10.5 9.9 9.7 8.0  --   --  6.0  --  3.8* 2.8*  --   CREATININE 9.04* 9.25*  --   --  4.21* 5.68*  --  4.17*  --  6.45*  --   LATICACIDVEN 2.0* 1.6  --   --   --   --   --   --   --   --  1.2    Estimated Creatinine Clearance: 11.2 mL/min (A) (by C-G formula based on SCr of 6.45 mg/dL (H)).    Allergies  Allergen Reactions   Empagliflozin     Other Reaction(s): Acidosis   Pravastatin     Other reaction(s): Muscle pain   Simvastatin     Other reaction(s): Muscle pain    Antimicrobials this admission: 8/30 cefepime >>  Dose adjustments this admission: None  Microbiology results: 8/30 BCx: pending 8/30 UCx: pending  8/24 MRSA PCR: negative  Thank you for allowing pharmacy to be a part of this patient's care.  Ronnald Ramp, PharmD, BCPS 09/03/2022 8:57 AM

## 2022-09-03 NOTE — ED Notes (Signed)
Sister, Juel Burrow [phone]) updated. She stated that when she talked with his dialysis team, they told her he hasn't been receiving dialysis treatment. [name] said she would call them back to find out how many he has missed.

## 2022-09-03 NOTE — Assessment & Plan Note (Signed)
 1/4 pack/day smoker Discussed cessation Nicotine patch

## 2022-09-03 NOTE — Evaluation (Signed)
Physical Therapy Evaluation Patient Details Name: Shawn [name] MRN: [id number] DOB: 24-Nov-1955 Today's Date: 09/03/2022  History of Present Illness  Pt is a 67 y.o. male with medical history significant of HFrEF with EF of 20-25%, ESRD on hemodialysis MWF, HTN, DM, substance abuse, history of CVA presenting with sepsis, hypothermia, and hypoglycemia.  MD assessment includes: hypothermia, iron deficiency anemia, and hypoglycemia.   Clinical Impression  Pt required some encouragement and education on benefits of activity to participate during the session.  Pt required physical assist with rolling and sup to/from sit but put forth good effort to assist.  In sitting pt able to maintain static balance at the EOB with UE support.  Pt able to laterally scoot around 2 feet at the EOB but was unable to come to full standing position and declined further assistance to try to come to standing secondary to feeling too weak to attempt.  Pt presents with a decline in functional mobility compared to his baseline and will benefit from continued PT services upon discharge to safely address deficits listed in patient problem list for decreased caregiver assistance and eventual return to PLOF.       If plan is discharge home, recommend the following: Two people to help with walking and/or transfers;A lot of help with bathing/dressing/bathroom;Assistance with cooking/housework;Direct supervision/assist for medications management;Assist for transportation;Help with stairs or ramp for entrance   Can travel by private vehicle   No    Equipment Recommendations Other (comment) (TBD at next venue of care)  Recommendations for Other Services       Functional Status Assessment Patient has had a recent decline in their functional status and demonstrates the ability to make significant improvements in function in a reasonable and predictable amount of time.     Precautions / Restrictions Precautions Precautions:  Fall Restrictions Weight Bearing Restrictions: No Other Position/Activity Restrictions: Bed bugs      Mobility  Bed Mobility Overal bed mobility: Needs Assistance Bed Mobility: Rolling, Supine to Sit, Sit to Supine Rolling: Mod assist   Supine to sit: Mod assist Sit to supine: Mod assist   General bed mobility comments: Mod A for BLE and trunk control    Transfers Overall transfer level: Needs assistance   Transfers: Bed to chair/wheelchair/BSC            Lateral/Scoot Transfers: Contact guard assist General transfer comment: Pt able to laterally scoot around 2 feet at the EOB but was unable to come to full standing position and declined further assistance to try to come to standing secondary to feeling too weak to attempt    Ambulation/Gait               General Gait Details: Unable  Stairs            Wheelchair Mobility     Tilt Bed    Modified Rankin (Stroke Patients Only)       Balance Overall balance assessment: Needs assistance   Sitting balance-Leahy Scale: Fair                                       Pertinent Vitals/Pain Pain Assessment Pain Assessment: No/denies pain    Home Living Family/patient expects to be discharged to:: Private residence Living Arrangements: Other relatives Available Help at Discharge: Family;Available 24 hours/day Type of Home: House Home Access: Stairs to enter Entrance Stairs-Rails: Right;Left;Can reach both Entrance [employer]  of Steps: 2   Home Layout: One level Home Equipment: Scientist, research (medical) (4 wheels);Cane - single point Additional Comments: Lives with sister with 24/7 supervision between sister and PCA that comes around 2hrs/day    Prior Function Prior Level of Function : Needs assist             Mobility Comments: SBA with amb with a rollator, min A with bed mobility and transfers, no fall history, limited community distance ambulation at baseline ADLs  Comments: Sister/PCA assist with ADLs     Extremity/Trunk Assessment   Upper Extremity Assessment Upper Extremity Assessment: Generalized weakness    Lower Extremity Assessment Lower Extremity Assessment: Generalized weakness       Communication   Communication Communication: No apparent difficulties Cueing Techniques: Verbal cues  Cognition Arousal: Alert Behavior During Therapy: WFL for tasks assessed/performed Overall Cognitive Status: Within Functional Limits for tasks assessed                                          General Comments      Exercises     Assessment/Plan    PT Assessment Patient needs continued PT services  PT Problem List Decreased strength;Decreased activity tolerance;Decreased balance;Decreased mobility;Decreased knowledge of use of DME       PT Treatment Interventions DME instruction;Gait training;Stair training;Functional mobility training;Therapeutic activities;Therapeutic exercise;Balance training;Patient/family education    PT Goals (Current goals can be found in the Care Plan section)  Acute Rehab PT Goals Patient Stated Goal: To get stronger and return home PT Goal Formulation: With patient Time For Goal Achievement: 09/16/22 Potential to Achieve Goals: Good    Frequency Min 1X/week     Co-evaluation               AM-PAC PT "6 Clicks" Mobility  Outcome Measure Help needed turning from your back to your side while in a flat bed without using bedrails?: A Lot Help needed moving from lying on your back to sitting on the side of a flat bed without using bedrails?: A Lot Help needed moving to and from a bed to a chair (including a wheelchair)?: A Lot Help needed standing up from a chair using your arms (e.g., wheelchair or bedside chair)?: A Lot Help needed to walk in hospital room?: Total Help needed climbing 3-5 steps with a railing? : Total 6 Click Score: 10    End of Session Equipment Utilized During  Treatment: Gait belt Activity Tolerance: Patient tolerated treatment well Patient left: in bed;with call bell/phone within reach;Other (comment) (Bilateral bed rails up as found in ED) Nurse Communication: Mobility status PT Visit Diagnosis: Difficulty in walking, not elsewhere classified (R26.2);Muscle weakness (generalized) (M62.81)    Time: 2440-1027 PT Time Calculation (min) (ACUTE ONLY): 21 min   Charges:   PT Evaluation $PT Eval Moderate Complexity: 1 Mod   PT General Charges $$ ACUTE PT VISIT: 1 Visit    D. Elly Modena PT, DPT 09/03/22, 2:43 PM

## 2022-09-03 NOTE — ED Notes (Signed)
Sister [name] updated.

## 2022-09-03 NOTE — Assessment & Plan Note (Addendum)
Blood sugar in 20s at home in the setting of excess insulin use and minimal p.o. intake Blood sugars now improved into the 100s status post hypoglycemia protocol Will continue to monitor closely Hold insulin for now Titrate as appropriate Noted admission 8/24 through 8/27 for hyperglycemia. Patient may benefit from formal diabetic education versus placement so as to avoid recurrent issues of labile blood sugars associated w/ insulin use.  Follow

## 2022-09-03 NOTE — Progress Notes (Signed)
PHARMACY -  BRIEF ANTIBIOTIC NOTE   Pharmacy has received consult(s) for Vancomycin from an ED provider.  The patient's profile has been reviewed for ht/wt/allergies/indication/available labs.    One time order(s) placed for Vancomycin 1500 mg IV X 1  Further antibiotics/pharmacy consults should be ordered by admitting physician if indicated.                       Thank you, Marquies Wanat D 09/03/2022  6:24 AM

## 2022-09-03 NOTE — Progress Notes (Signed)
[facility]  ROUNDING NOTE   Subjective:   [name] was admitted to [facility] on 09/03/2022 for Sepsis [facility]) [A41.9]  Patient is known to our practice and receives dialysis at [facility] on a MWF schedule, supervised by Dr Thedore Mins. He was recently admitted for hyperkalemia and discharge on 08/31/22. He presents today for hypoglycemia and found to have sepsis.   Labs on ED arrival show sodium 134, BUN 57, creatinine 6.45 with GFR 9, BNP greater than 4500, WBC 2.8 and Hgb 9.9. TSH 7139. Covid negative. UA appears cloudy with hematuria, proteinuria, and moderate leukocytes. Chest xray  shows left pleural effusion with vascular congestion.   We have been consulted to manage dialysis needs.  Objective:  Vital signs in last 24 hours:  Temp:  [90.5 F (32.5 C)-97.5 F (36.4 C)] 97.5 F (36.4 C) (08/30 1253) Pulse Rate:  [28-56] 56 (08/30 1200) Resp:  [12-18] 18 (08/30 1030) BP: (90-118)/(59-79) 108/72 (08/30 1200) SpO2:  [91 %-99 %] 97 % (08/30 1200) Weight:  [70.4 kg] 70.4 kg (08/30 0417)  Weight change:  Filed Weights   09/03/22 0417  Weight: 70.4 kg    Intake/Output: No intake/output data recorded.   Intake/Output this shift:  No intake/output data recorded.  Physical Exam: General: ill appearing  Head: Normocephalic, atraumatic. Moist oral mucosal membranes  Eyes: Anicteric  Lungs:  Clear to auscultation  Heart: Regular rate and rhythm  Abdomen:  Soft, nontender  Extremities: no peripheral edema.  Neurologic: Alert, moving all four extremities  Skin: No lesions  Access: RIJ permcath    Basic Metabolic Panel: Recent Labs  Lab 08/28/22 2011 08/29/22 0410 08/30/22 0511 08/31/22 0546 08/31/22 0836 09/03/22 0422  NA 129* 131* 133* 132*  --  134*  K 6.1* 3.3* 3.7 3.7  --  4.3  CL 94* 95* 98 99  --  92*  CO2 <7* 22 20* 23  --  24  GLUCOSE 1,015* 304* 73 66*  --  114*  BUN 135* 59* 76* 42*  --  57*  CREATININE 9.25* 4.21* 5.68* 4.17*   --  6.45*  CALCIUM 8.0* 8.2* 8.2* 8.3*  --  8.9  MG  --   --  1.6* 1.7 1.8 2.4  PHOS 9.4*  --  4.3 3.2  --   --     Liver Function Tests: Recent Labs  Lab 08/28/22 1634 08/28/22 2011 08/29/22 0410 08/30/22 0511  AST 21  --  23  --   ALT 12  --  11  --   ALKPHOS 89  --  84  --   BILITOT 1.5*  --  0.8  --   PROT 7.0  --  6.6  --   ALBUMIN 3.4* 3.6 3.1* 3.0*   No results for input(s): "LIPASE", "AMYLASE" in the last 168 hours. No results for input(s): "AMMONIA" in the last 168 hours.  CBC: Recent Labs  Lab 08/28/22 1634 08/28/22 2011 08/28/22 2012 08/29/22 0407 08/30/22 0959 08/31/22 0836 09/03/22 0422  WBC 10.5   < > 9.7 8.0 6.0 3.8* 2.8*  NEUTROABS 8.8*  --   --   --   --  2.5 2.1  HGB 8.7*   < > 8.4* 8.2* 9.1* 9.1* 9.9*  HCT 30.7*   < > 29.1* 23.9* 26.6* 27.6* 29.7*  MCV 101.0*   < > 99.3 85.4 85.0 87.3 87.1  PLT 247   < > 231 214 190 148* 170   < > = values  in this interval not displayed.    Cardiac Enzymes: Recent Labs  Lab 09/03/22 0953  CKTOTAL 89    BNP: Invalid input(s): "POCBNP"  CBG: Recent Labs  Lab 09/03/22 0428 09/03/22 0539 09/03/22 0629 09/03/22 0746 09/03/22 1251  GLUCAP 106* 151* 145* 143* 135*    Microbiology: Results for orders placed or performed during the hospital encounter of 09/03/22  SARS Coronavirus 2 by RT PCR (hospital order, performed in [facility] hospital lab) *cepheid single result test* Anterior Nasal Swab     Status: None   Collection Time: 09/03/22  5:33 AM   Specimen: Anterior Nasal Swab  Result Value Ref Range Status   SARS Coronavirus 2 by RT PCR NEGATIVE NEGATIVE Final    Comment: (NOTE) SARS-CoV-2 target nucleic acids are NOT DETECTED.  The SARS-CoV-2 RNA is generally detectable in upper and lower respiratory specimens during the acute phase of infection. The lowest concentration of SARS-CoV-2 viral copies this assay can detect is 250 copies / mL. A negative result does not preclude SARS-CoV-2  infection and should not be used as the sole basis for treatment or other patient management decisions.  A negative result may occur with improper specimen collection / handling, submission of specimen other than nasopharyngeal swab, presence of viral mutation(s) within the areas targeted by this assay, and inadequate number of viral copies (<250 copies / mL). A negative result must be combined with clinical observations, patient history, and epidemiological information.  Fact Sheet for Patients:   RoadLapTop.co.za  Fact Sheet for Healthcare Providers: http://kim-miller.com/  This test is not yet approved or  cleared by the [place] FDA and has been authorized for detection and/or diagnosis of SARS-CoV-2 by FDA under an Emergency Use Authorization (EUA).  This EUA will remain in effect (meaning this test can be used) for the duration of the COVID-19 declaration under Section 564(b)(1) of the Act, 21 U.S.C. section 360bbb-3(b)(1), unless the authorization is terminated or revoked sooner.  Performed at [facility], [address] [place], [place] [address]     Coagulation Studies: No results for input(s): "LABPROT", "INR" in the last 72 hours.   Urinalysis: Recent Labs    09/03/22 0543  COLORURINE YELLOW*  LABSPEC 1.019  PHURINE 5.0  GLUCOSEU 150*  HGBUR LARGE*  BILIRUBINUR NEGATIVE  KETONESUR NEGATIVE  PROTEINUR >=300*  NITRITE NEGATIVE  LEUKOCYTESUR MODERATE*      Imaging: DG Chest Portable 1 View  Result Date: 09/03/2022 CLINICAL DATA:  66 year old male with hypoglycemia, shortness of breath. EXAM: PORTABLE CHEST 1 VIEW COMPARISON:  Portable chest radiograph 08/29/2022 and earlier. CT Abdomen and Pelvis 08/28/2022. FINDINGS: Portable AP upright view at 0521 hours. Mildly more rotated to the right. Stable right chest dual lumen dialysis type catheter. Stable cardiomegaly and mediastinal contours.  Visualized tracheal air column is within normal limits. Veiling left lung opacity is new. No pneumothorax. No consolidation. Stable pulmonary vascularity, with diffuse vascular congestion. No acute osseous abnormality identified. Paucity of bowel gas in the visible abdomen. IMPRESSION: 1. Veiling left lung opacity suggesting new or increased left pleural effusion. 2. Otherwise stable Cardiomegaly with vascular congestion/interstitial edema. Electronically Signed   By: Odessa Fleming M.D.   On: 09/03/2022 06:14     Medications:    sodium chloride     [START ON 09/04/2022] ceFEPime (MAXIPIME) IV      aspirin EC  81 mg Oral Daily   atorvastatin  40 mg Oral QHS   Chlorhexidine Gluconate Cloth  6 each Topical Q0600  heparin  5,000 Units Subcutaneous Q8H   [START ON 09/04/2022] levothyroxine  75 mcg Oral QAC breakfast   nicotine  7 mg Transdermal Daily   pantoprazole  40 mg Oral Daily   sodium chloride flush  3 mL Intravenous Q12H   sodium chloride, acetaminophen, alteplase, dextrose, heparin, sodium chloride flush  Assessment/ Plan:  [name] is a 67 y.o.  male with end stage renal disease on hemodialysis, hypertension, diabetes mellitus type II, congestive heart failure, coronary artery disease, CVA, alcohol abuse, tobacco abuse and hypothyroidism who presents to Sepsis ([name]) [A41.9]   [facility] MWF [facility] RIJ permcath 64kg  End Stage Renal Disease -Will receive dialysis later today.    Diabetes mellitus type II with chronic kidney disease:  -Hypoglycemia with EMS.  - Hgb A1c 11.0 on 08/29/22   Anemia with chronic kidney disease: macrocytic - - ESA with MWF scheduled HD treatments.  - Hgb 9.9 - Remains on Mircera at outpatient clinic  4. Secondary Hyperparathyroidism: with outpatient labs: none available.   Lab Results  Component Value Date   CALCIUM 8.9 09/03/2022   PHOS 3.2 08/31/2022       LOS: 0 Shawn [name] 8/30/20241:14 PM

## 2022-09-03 NOTE — Assessment & Plan Note (Signed)
Baseline ESRD Monday Wednesday Friday Due for hemodialysis today Consult nephrology

## 2022-09-03 NOTE — Assessment & Plan Note (Signed)
Baseline hgb 8-10  Hgb 9.9 today  Monitor

## 2022-09-03 NOTE — ED Notes (Signed)
IV site bubbled up when NS flush given. No other meds given through IV. MD and pharmacy aware. Pt has no complaints at this time.

## 2022-09-03 NOTE — ED Notes (Signed)
Report called for dialysis treatment. Dialysis staff stated he will have to wait until 1-2p due to contact precautions

## 2022-09-04 DIAGNOSIS — E162 Hypoglycemia, unspecified: Secondary | ICD-10-CM | POA: Diagnosis not present

## 2022-09-04 LAB — COMPREHENSIVE METABOLIC PANEL
ALT: 7 U/L (ref 0–44)
AST: 12 U/L — ABNORMAL LOW (ref 15–41)
Albumin: 2.7 g/dL — ABNORMAL LOW (ref 3.5–5.0)
Alkaline Phosphatase: 67 U/L (ref 38–126)
Anion gap: 12 (ref 5–15)
BUN: 69 mg/dL — ABNORMAL HIGH (ref 8–23)
CO2: 22 mmol/L (ref 22–32)
Calcium: 8.1 mg/dL — ABNORMAL LOW (ref 8.9–10.3)
Chloride: 95 mmol/L — ABNORMAL LOW (ref 98–111)
Creatinine, Ser: 6.78 mg/dL — ABNORMAL HIGH (ref 0.61–1.24)
GFR, Estimated: 8 mL/min — ABNORMAL LOW (ref >60)
Glucose, Bld: 197 mg/dL — ABNORMAL HIGH (ref 70–99)
Potassium: 4.8 mmol/L (ref 3.5–5.1)
Sodium: 129 mmol/L — ABNORMAL LOW (ref 135–145)
Total Bilirubin: 0.3 mg/dL (ref 0.3–1.2)
Total Protein: 5.8 g/dL — ABNORMAL LOW (ref 6.5–8.1)

## 2022-09-04 LAB — GLUCOSE, CAPILLARY
Glucose-Capillary: 188 mg/dL — ABNORMAL HIGH (ref 70–99)
Glucose-Capillary: 236 mg/dL — ABNORMAL HIGH (ref 70–99)
Glucose-Capillary: 239 mg/dL — ABNORMAL HIGH (ref 70–99)
Glucose-Capillary: 178 mg/dL — ABNORMAL HIGH (ref 70–99)

## 2022-09-04 LAB — THYROID PANEL WITH TSH
Free Thyroxine Index: 2.4 (ref 1.2–4.9)
T3 Uptake Ratio: 41 % — ABNORMAL HIGH (ref 24–39)
T4, Total: 5.9 ug/dL (ref 4.5–12.0)
TSH: 4.56 u[IU]/mL — ABNORMAL HIGH (ref 0.450–4.500)

## 2022-09-04 LAB — CBC
HCT: 28.4 % — ABNORMAL LOW (ref 39.0–52.0)
Hemoglobin: 9.4 g/dL — ABNORMAL LOW (ref 13.0–17.0)
MCH: 28.7 pg (ref 26.0–34.0)
MCHC: 33.1 g/dL (ref 30.0–36.0)
MCV: 86.9 fL (ref 80.0–100.0)
Platelets: 197 10*3/uL (ref 150–400)
RBC: 3.27 MIL/uL — ABNORMAL LOW (ref 4.22–5.81)
RDW: 15.7 % — ABNORMAL HIGH (ref 11.5–15.5)
WBC: 3.7 10*3/uL — ABNORMAL LOW (ref 4.0–10.5)
nRBC: 0 % (ref 0.0–0.2)

## 2022-09-04 LAB — URINE CULTURE: Culture: NO GROWTH

## 2022-09-04 LAB — HEPATITIS B SURFACE ANTIBODY, QUANTITATIVE: Hep B S AB Quant (Post): 34.1 m[IU]/mL

## 2022-09-04 MED ORDER — INSULIN ASPART 100 UNIT/ML IJ SOLN
0.0000 [IU] | Freq: Three times a day (TID) | INTRAMUSCULAR | Status: DC
  Administered 2022-09-04 (×2): 3 [IU] via SUBCUTANEOUS
  Administered 2022-09-05: 2 [IU] via SUBCUTANEOUS
  Administered 2022-09-05: 1 [IU] via SUBCUTANEOUS
  Administered 2022-09-05 – 2022-09-06 (×2): 5 [IU] via SUBCUTANEOUS
  Administered 2022-09-06: 3 [IU] via SUBCUTANEOUS
  Administered 2022-09-07 (×2): 2 [IU] via SUBCUTANEOUS
  Administered 2022-09-07: 1 [IU] via SUBCUTANEOUS
  Administered 2022-09-08: 3 [IU] via SUBCUTANEOUS
  Administered 2022-09-08: 1 [IU] via SUBCUTANEOUS
  Administered 2022-09-09: 3 [IU] via SUBCUTANEOUS
  Administered 2022-09-09: 2 [IU] via SUBCUTANEOUS
  Filled 2022-09-04 (×13): qty 1

## 2022-09-04 MED ORDER — INSULIN GLARGINE-YFGN 100 UNIT/ML ~~LOC~~ SOLN
5.0000 [IU] | Freq: Every day | SUBCUTANEOUS | Status: DC
  Administered 2022-09-04 – 2022-09-09 (×6): 5 [IU] via SUBCUTANEOUS
  Filled 2022-09-04 (×6): qty 0.05

## 2022-09-04 NOTE — Progress Notes (Signed)
[facility]  ROUNDING NOTE   Subjective:   [name] was admitted to [facility] on 09/03/2022 for Sinus bradycardia [R00.1] Hypoglycemia [E16.2] Acute UTI [N39.0] Hypothermia, initial encounter [T68.XXXA] Sepsis ([name]) [A41.9]  Patient is known to our practice and receives dialysis at [facility] on a MWF schedule, supervised by Dr Thedore Mins. He was recently admitted for hyperkalemia and discharge on 08/31/22. He presents today for hypoglycemia and found to have sepsis.   Patient seen sitting up in bed earlier today No family present Currently eating breakfast, denies nausea or vomiting  Scheduled to receive dialysis later this evening  Objective:  Vital signs in last 24 hours:  Temp:  [97.5 F (36.4 C)-98.3 F (36.8 C)] 97.5 F (36.4 C) (08/31 1233) Pulse Rate:  [58-64] 64 (08/31 1233) Resp:  [14-18] 17 (08/31 1233) BP: (101-120)/(61-77) 120/77 (08/31 1233) SpO2:  [93 %-97 %] 97 % (08/31 1233)  Weight change:  Filed Weights   09/03/22 0417  Weight: 70.4 kg    Intake/Output: I/O last 3 completed shifts: In: 360 [P.O.:360] Out: -    Intake/Output this shift:  No intake/output data recorded.  Physical Exam: General: NAD  Head: Normocephalic, atraumatic. Moist oral mucosal membranes  Eyes: Anicteric  Lungs:  Clear to auscultation  Heart: Regular rate and rhythm  Abdomen:  Soft, nontender  Extremities: 1-2+ left > right peripheral edema.  Neurologic: Alert, moving all four extremities  Skin: No lesions  Access: RIJ permcath    Basic Metabolic Panel: Recent Labs  Lab 08/28/22 2011 08/29/22 0410 08/30/22 0511 08/31/22 0546 08/31/22 0836 09/03/22 0422 09/04/22 0527  NA 129* 131* 133* 132*  --  134* 129*  K 6.1* 3.3* 3.7 3.7  --  4.3 4.8  CL 94* 95* 98 99  --  92* 95*  CO2 <7* 22 20* 23  --  24 22  GLUCOSE 1,015* 304* 73 66*  --  114* 197*  BUN 135* 59* 76* 42*  --  57* 69*  CREATININE 9.25* 4.21* 5.68* 4.17*  --  6.45* 6.78*   CALCIUM 8.0* 8.2* 8.2* 8.3*  --  8.9 8.1*  MG  --   --  1.6* 1.7 1.8 2.4  --   PHOS 9.4*  --  4.3 3.2  --   --   --     Liver Function Tests: Recent Labs  Lab 08/28/22 1634 08/28/22 2011 08/29/22 0410 08/30/22 0511 09/04/22 0527  AST 21  --  23  --  12*  ALT 12  --  11  --  7  ALKPHOS 89  --  84  --  67  BILITOT 1.5*  --  0.8  --  0.3  PROT 7.0  --  6.6  --  5.8*  ALBUMIN 3.4* 3.6 3.1* 3.0* 2.7*   No results for input(s): "LIPASE", "AMYLASE" in the last 168 hours. No results for input(s): "AMMONIA" in the last 168 hours.  CBC: Recent Labs  Lab 08/28/22 1634 08/28/22 2011 08/29/22 0407 08/30/22 0959 08/31/22 0836 09/03/22 0422 09/04/22 0527  WBC 10.5   < > 8.0 6.0 3.8* 2.8* 3.7*  NEUTROABS 8.8*  --   --   --  2.5 2.1  --   HGB 8.7*   < > 8.2* 9.1* 9.1* 9.9* 9.4*  HCT 30.7*   < > 23.9* 26.6* 27.6* 29.7* 28.4*  MCV 101.0*   < > 85.4 85.0 87.3 87.1 86.9  PLT 247   < > 214 190 148* 170  197   < > = values in this interval not displayed.    Cardiac Enzymes: Recent Labs  Lab 09/03/22 0953  CKTOTAL 89    BNP: Invalid input(s): "POCBNP"  CBG: Recent Labs  Lab 09/03/22 2017 09/03/22 2201 09/03/22 2347 09/04/22 0828 09/04/22 1230  GLUCAP 140* 130* 162* 188* 236*    Microbiology: Results for orders placed or performed during the hospital encounter of 09/03/22  Blood culture (routine x 2)     Status: None (Preliminary result)   Collection Time: 09/03/22  5:33 AM   Specimen: BLOOD LEFT ARM  Result Value Ref Range Status   Specimen Description BLOOD LEFT ARM  Final   Special Requests   Final    BOTTLES DRAWN AEROBIC AND ANAEROBIC Blood Culture adequate volume   Culture   Final    NO GROWTH 1 DAY Performed at [facility], [address], [place], [place] [address]    Report Status PENDING  Incomplete  SARS Coronavirus 2 by RT PCR (hospital order, performed in [facility] hospital lab) *cepheid single result test* Anterior Nasal Swab     Status:  None   Collection Time: 09/03/22  5:33 AM   Specimen: Anterior Nasal Swab  Result Value Ref Range Status   SARS Coronavirus 2 by RT PCR NEGATIVE NEGATIVE Final    Comment: (NOTE) SARS-CoV-2 target nucleic acids are NOT DETECTED.  The SARS-CoV-2 RNA is generally detectable in upper and lower respiratory specimens during the acute phase of infection. The lowest concentration of SARS-CoV-2 viral copies this assay can detect is 250 copies / mL. A negative result does not preclude SARS-CoV-2 infection and should not be used as the sole basis for treatment or other patient management decisions.  A negative result may occur with improper specimen collection / handling, submission of specimen other than nasopharyngeal swab, presence of viral mutation(s) within the areas targeted by this assay, and inadequate number of viral copies (<250 copies / mL). A negative result must be combined with clinical observations, patient history, and epidemiological information.  Fact Sheet for Patients:   RoadLapTop.co.za  Fact Sheet for Healthcare Providers: http://kim-miller.com/  This test is not yet approved or  cleared by the [place] FDA and has been authorized for detection and/or diagnosis of SARS-CoV-2 by FDA under an Emergency Use Authorization (EUA).  This EUA will remain in effect (meaning this test can be used) for the duration of the COVID-19 declaration under Section 564(b)(1) of the Act, 21 U.S.C. section 360bbb-3(b)(1), unless the authorization is terminated or revoked sooner.  Performed at [facility], [address], [place], [place] [address]   Urine Culture     Status: None   Collection Time: 09/03/22  5:43 AM   Specimen: Urine, Clean Catch  Result Value Ref Range Status   Specimen Description   Final    URINE, CLEAN CATCH Performed at [facility], [address] [place], [place] [address]    Special  Requests   Final    NONE Performed at [facility], [address] [place], [place] [address]    Culture   Final    NO GROWTH Performed at [facility], [address] [place], [place] [address]    Report Status 09/04/2022 FINAL  Final  Blood culture (routine x 2)     Status: None (Preliminary result)   Collection Time: 09/03/22  6:30 AM   Specimen: BLOOD  Result Value Ref Range Status   Specimen  Description BLOOD BLOOD RIGHT ARM  Final   Special Requests   Final    BOTTLES DRAWN AEROBIC AND ANAEROBIC Blood Culture adequate volume   Culture   Final    NO GROWTH < 24 HOURS Performed at [facility], [address] [place], [place] [address]    Report Status PENDING  Incomplete    Coagulation Studies: No results for input(s): "LABPROT", "INR" in the last 72 hours.   Urinalysis: Recent Labs    09/03/22 0543  COLORURINE YELLOW*  LABSPEC 1.019  PHURINE 5.0  GLUCOSEU 150*  HGBUR LARGE*  BILIRUBINUR NEGATIVE  KETONESUR NEGATIVE  PROTEINUR >=300*  NITRITE NEGATIVE  LEUKOCYTESUR MODERATE*      Imaging: DG Chest Portable 1 View  Result Date: 09/03/2022 CLINICAL DATA:  67 year old male with hypoglycemia, shortness of breath. EXAM: PORTABLE CHEST 1 VIEW COMPARISON:  Portable chest radiograph 08/29/2022 and earlier. CT Abdomen and Pelvis 08/28/2022. FINDINGS: Portable AP upright view at 0521 hours. Mildly more rotated to the right. Stable right chest dual lumen dialysis type catheter. Stable cardiomegaly and mediastinal contours. Visualized tracheal air column is within normal limits. Veiling left lung opacity is new. No pneumothorax. No consolidation. Stable pulmonary vascularity, with diffuse vascular congestion. No acute osseous abnormality identified. Paucity of bowel gas in the visible abdomen. IMPRESSION: 1. Veiling left lung opacity suggesting new or increased left pleural effusion. 2. Otherwise stable Cardiomegaly with vascular  congestion/interstitial edema. Electronically Signed   By: Odessa Fleming M.D.   On: 09/03/2022 06:14     Medications:    sodium chloride     ceFEPime (MAXIPIME) IV 1 g (09/04/22 0916)    aspirin EC  81 mg Oral Daily   atorvastatin  40 mg Oral QHS   Chlorhexidine Gluconate Cloth  6 each Topical Q0600   heparin  5,000 Units Subcutaneous Q8H   insulin aspart  0-9 Units Subcutaneous TID WC   insulin glargine-yfgn  5 Units Subcutaneous Q1200   levothyroxine  75 mcg Oral QAC breakfast   nicotine  7 mg Transdermal Daily   pantoprazole  40 mg Oral Daily   sodium chloride flush  3 mL Intravenous Q12H   sodium chloride, acetaminophen, alteplase, dextrose, heparin, sodium chloride flush  Assessment/ Plan:  [name] is a 67 y.o.  male with end stage renal disease on hemodialysis, hypertension, diabetes mellitus type II, congestive heart failure, coronary artery disease, CVA, alcohol abuse, tobacco abuse and hypothyroidism who presents to Sinus bradycardia [R00.1] Hypoglycemia [E16.2] Acute UTI [N39.0] Hypothermia, initial encounter [T68.XXXA] Sepsis ([name]) [A41.9]   [facility] MWF [facility] RIJ permcath 64kg  End Stage Renal Disease -Dialysis scheduled for later today due to missed treatment on Friday.  Next treatment scheduled for Monday to maintain outpatient schedule.   Diabetes mellitus type II with chronic kidney disease:  -Hypoglycemia with EMS.  - Hgb A1c 11.0 on 08/29/22 -Glucose well-managed.  Anemia with chronic kidney disease: macrocytic - - ESA with MWF scheduled HD treatments.  - Hgb 9.4, within optimal range - Remains on Mircera at outpatient clinic  4. Secondary Hyperparathyroidism: with outpatient labs: none available.   Lab Results  Component Value Date   CALCIUM 8.1 (L) 09/04/2022   PHOS 3.2 08/31/2022    Will continue to monitor bone minerals during this admission   LOS: 1 Shawn [name] 8/31/20243:26 PM

## 2022-09-04 NOTE — Plan of Care (Signed)
  Problem: Clinical Measurements: Goal: Signs and symptoms of infection will decrease Outcome: Progressing   Problem: Respiratory: Goal: Ability to maintain adequate ventilation will improve Outcome: Progressing   

## 2022-09-04 NOTE — TOC Initial Note (Signed)
Transition of Care [facility]) - Initial/Assessment Note    Patient Details  Name: Shawn [name] MRN: [id number] Date of Birth: 02-Dec-1955  Transition of Care [facility]) CM/SW Contact:    Shawn Cline, LCSW Phone Number: 09/04/2022, 11:29 AM  Clinical Narrative:                 Patient on contact precautions. CSW spoke with patient by phone regarding SNF recommendation. Patient is from home. His sister Shawn [name] lives with him. He uses medical transportation. Patient has aide services at home, he states they come for 2 hours 5 days a week.  PCP and Pharmacy is the [facility]. Patient has a wheel chair, rollator, and cane at home. Patient is agreeable to SNF. Patient states he would prefer somewhere local to [place]. Patient states he does not want to go to [facility] where he has been in the past.  SNF work up started.   Expected Discharge Plan: Skilled Nursing Facility Barriers to Discharge: Continued Medical Work up   Patient Goals and CMS Choice Patient states their goals for this hospitalization and ongoing recovery are:: SNF CMS Medicare.gov Compare Post Acute Care list provided to:: Patient Choice offered to / list presented to : Patient      Expected Discharge Plan and Services       Living arrangements for the past 2 months: Single Family Home                                      Prior Living Arrangements/Services Living arrangements for the past 2 months: Single Family Home Lives with:: Siblings Patient language and need for interpreter reviewed:: Yes Do you feel safe going back to the place where you live?: Yes      Need for Family Participation in Patient Care: Yes (Comment) Care giver support system in place?: Yes (comment) Current home services: DME, Homehealth aide Criminal Activity/Legal Involvement Pertinent to Current Situation/Hospitalization: No - Comment as needed  Activities of Daily Living Home Assistive Devices/Equipment:  Wheelchair ADL Screening (condition at time of admission) Patient's cognitive ability adequate to safely complete daily activities?: Yes Is the patient deaf or have difficulty hearing?: No Does the patient have difficulty seeing, even when wearing glasses/contacts?: No Does the patient have difficulty concentrating, remembering, or making decisions?: Yes Patient able to express need for assistance with ADLs?: Yes Does the patient have difficulty dressing or bathing?: Yes Independently performs ADLs?: No Communication: Independent Dressing (OT): Needs assistance Is this a change from baseline?: Pre-admission baseline Grooming: Needs assistance Is this a change from baseline?: Pre-admission baseline Feeding: Independent Bathing: Needs assistance Is this a change from baseline?: Pre-admission baseline Toileting: Needs assistance Is this a change from baseline?: Pre-admission baseline In/Out Bed: Needs assistance Is this a change from baseline?: Pre-admission baseline Walks in Home: Needs assistance Is this a change from baseline?: Pre-admission baseline Does the patient have difficulty walking or climbing stairs?: Yes Weakness of Legs: Both Weakness of Arms/Hands: Both  Permission Sought/Granted Permission sought to share information with : Oceanographer granted to share information with : Yes, Verbal Permission Granted     Permission granted to share info w AGENCY: SNFs        Emotional Assessment       Orientation: : Oriented to Situation, Oriented to Self, Oriented to Place, Oriented to  Time Alcohol / Substance Use: Not Applicable Psych Involvement:  No (comment)  Admission diagnosis:  Sinus bradycardia [R00.1] Hypoglycemia [E16.2] Acute UTI [N39.0] Hypothermia, initial encounter [T68.XXXA] Sepsis [facility]) [A41.9] Patient Active Problem List   Diagnosis Date Noted   Sepsis ([name]) 09/03/2022   HFrEF (heart failure with reduced ejection  fraction) ([name]) 09/03/2022   Nausea and vomiting 08/30/2022   ESRD on dialysis ([name]) 08/29/2022   DKA (diabetic ketoacidosis) ([name]) 08/28/2022   Acute exacerbation of CHF (congestive heart failure) ([name]) 08/13/2021   Weakness 08/13/2021   Iron deficiency anemia 07/01/2021   Hyperosmolar hyperglycemic state (HHS) ([name]) 03/16/2020   Acute metabolic encephalopathy 03/16/2020   Elevated troponin 03/16/2020   HLD (hyperlipidemia) 03/16/2020   Abnormal CXR 03/16/2020   Hypothermia 03/16/2020   Hypotension 04/30/2019   Stroke ([name])    Protein-calorie malnutrition, severe ([name]) 04/25/2019   Hyperkalemia 04/23/2019   Acute kidney injury superimposed on CKD 3b ([name]) 04/23/2019   C. difficile colitis 04/23/2019   Hypoglycemia 09/22/2018   Essential hypertension 05/12/2018   Acute on chronic HFrEF (heart failure with reduced ejection fraction) ([name]) 12/15/2017   NICM (nonischemic cardiomyopathy) ([name]) 05/13/2017   Type 2 diabetes mellitus with stage 3b chronic kidney disease, with long-term current use of insulin ([name]) 05/13/2017   Smoker 05/13/2017   Alcohol abuse 05/13/2017   PCP:  [facility]:   [facility] - [place], [place] - [address] [place] [address] Phone: [phone] Fax: [phone]  [facility] [address] ([address]), [place] - [address] [place] ([place]) [place] [address] Phone: [phone] Fax: [phone]  [facility] [address] [place] [address] Phone: [phone] Fax: [phone]     Social Determinants of Health (SDOH) Social History: SDOH Screenings   Food Insecurity: No Food Insecurity (09/03/2022)  Housing: Low Risk  (09/03/2022)  Transportation Needs: No Transportation Needs (09/03/2022)  Utilities: Not At Risk (09/03/2022)  Financial Resource Strain: High Risk (08/24/2019)   Received from [facility], [facility]  Social Connections: Moderately Isolated (08/24/2019)   Received from [facility], [facility]  Stress: No Stress Concern Present (08/24/2019)   Received from [facility], [facility]  Tobacco Use: High Risk (09/03/2022)   SDOH Interventions:     Readmission Risk Interventions    09/04/2022   11:28 AM 06/24/2021    2:57 PM 01/08/2021    1:22 PM  Readmission Risk Prevention Plan  Transportation Screening Complete Complete Complete  PCP or Specialist Appt within 3-5 Days Complete    HRI or Home Care Consult Complete    Social Work Consult for Recovery Care Planning/Counseling Complete  Complete  Palliative Care Screening Not Applicable  Not Applicable  Medication Review Oceanographer) Complete Complete Complete  Palliative Care Screening  Not Applicable   Skilled Nursing Facility  Not Applicable

## 2022-09-04 NOTE — Progress Notes (Signed)
  PROGRESS NOTE    Shawn [name]  YQI:[id number] DOB: May 26, 1955 DOA: 09/03/2022 PCP: [facility]  [id number]  LOS: 1 day   Brief hospital course:   Assessment & Plan: Shawn [name] is a 67 y.o. male with medical history significant of HFpEF, ESRD on hemodialysis Monday Wednesday Friday, hypertension, type 2 diabetes, substance abuse, history of CVA presenting with sepsis, hypothermia, hypoglycemia.  Patient reports taking nighttime insulin coverage last night.  Patient states he woke up with severe chills with noted blood sugar in the 20s.  Noted admission August 24 through August 27 for issues including DKA associated with missed insulin dosing, hyperkalemia as well as anasarca.  Patient reports being otherwise stable from discharge.    * Sepsis ([name]), ruled out --no source of infection.  Urine cx neg growth. --d/c abx  Hypothermia, resolved Tmax 90-92, improving with bear hugger Suspect likely due to hypoglycemia  Hypoglycemia DM2 Pt states his sugar was 135 before bed and took 3 units of insulin. Pt states he woke up not feeling well and called 911. Ems states pts blood sugar 26. --being ESRD, pt would be more sensitive to insulin --BG now trending up Plan: --start home glargine 5u daily --ACHS and SSI  Essential hypertension LLN BP  Hold BP regimen  Follow   Hx of Iron deficiency anemia Anemia with chronic kidney disease Baseline hgb 8-10  --ESA with MWF scheduled HD treatments.   Smoker 1/4 pack/day smoker Discussed cessation with admitting physician Nicotine patch  HLD (hyperlipidemia) Cont statin   HFrEF (heart failure with reduced ejection fraction) ([name]) 2D echo June 2023 with EF of 20 to 25% Looks fairly euvolemic to mildly dry at present Baseline volume status mediated by predominantly hemodialysis and Lasix in between sessions  ESRD on dialysis ([name]) MWF --iHD per nephrology   DVT prophylaxis: Heparin SQ Code Status:  Full code  Family Communication:  Level of care: Med-Surg Dispo:   The patient is from: home Anticipated d/c is to: SNF rehab Anticipated d/c date is: ready tomorrow   Subjective and Interval History:  Pt reported feeling better.  No complaint.   Objective: Vitals:   09/04/22 0518 09/04/22 0817 09/04/22 1233 09/04/22 1649  BP: 105/71 108/73 120/77 106/66  Pulse: 64 64 64 67  Resp: 18 18 17 17   Temp: 97.7 F (36.5 C) 97.7 F (36.5 C) (!) 97.5 F (36.4 C) 97.9 F (36.6 C)  TempSrc: Oral Oral Oral Oral  SpO2: 97% 97% 97% 91%  Weight:      Height:        Intake/Output Summary (Last 24 hours) at 09/04/2022 1839 Last data filed at 09/04/2022 0600 Gross per 24 hour  Intake 360 ml  Output --  Net 360 ml   Filed Weights   09/03/22 0417  Weight: 70.4 kg    Examination:   Constitutional: NAD, AAOx3 HEENT: conjunctivae and lids normal, EOMI CV: No cyanosis.   RESP: normal respiratory effort, on RA Extremities: No effusions, edema in BLE SKIN: warm, dry Neuro: II - XII grossly intact.   Psych: Normal mood and affect.  Appropriate judgement and reason   Data Reviewed: I have personally reviewed labs and imaging studies  Time spent: 50 minutes  Darlin Priestly, MD [facility] If 7PM-7AM, please contact night-coverage 09/04/2022, 6:39 PM

## 2022-09-04 NOTE — Plan of Care (Signed)
  Problem: Clinical Measurements: Goal: Signs and symptoms of infection will decrease Outcome: Progressing   Problem: Respiratory: Goal: Ability to maintain adequate ventilation will improve Outcome: Progressing   Problem: Education: Goal: Knowledge of General Education information will improve Description: Including pain rating scale, medication(s)/side effects and non-pharmacologic comfort measures Outcome: Progressing   Problem: Nutrition: Goal: Adequate nutrition will be maintained Outcome: Progressing   Problem: Coping: Goal: Level of anxiety will decrease Outcome: Progressing   Problem: Safety: Goal: Ability to remain free from injury will improve Outcome: Progressing   Problem: Health Behavior/Discharge Planning: Goal: Ability to manage health-related needs will improve Outcome: Not Progressing   Problem: Activity: Goal: Risk for activity intolerance will decrease Outcome: Not Progressing

## 2022-09-04 NOTE — Evaluation (Signed)
Occupational Therapy Evaluation Patient Details Name: Shawn [name] MRN: [id number] DOB: [birth year] Today's Date: 09/04/2022   History of Present Illness Shawn [name] is a 67 y.o. male with medical history significant of HFrEF with EF of 20-25%, ESRD on hemodialysis MWF, HTN, DM, substance abuse, history of CVA presenting with sepsis, hypothermia, and hypoglycemia.  MD assessment includes: hypothermia, iron deficiency anemia, and hypoglycemia.   Clinical Impression   Shawn [name] presents with generalized weakness, limited endurance, and impaired balance. He lives with a sister and has a PCA several hours each day. Shawn [name] reports that he uses a WC at baseline, that he is able to perform bed mobility and bed<>chair transfers [facility]. Sister and aide assist with BADL and IADL. Provided education re: managing DM, diet, importance of physical activity and building strength. Shawn [name] verbalizes understanding. Shawn [name] appears to be weaker than at his baseline and could benefit from daily skilled care, < 3 hours/day. However, Shawn [name] insists he is not interested in SNF and that he intends to DC home. In that case, Shawn [name] would benefit from skilled care at home to help with building strength, endurance, and balance.    If plan is discharge home, recommend the following: A little help with walking and/or transfers;A little help with bathing/dressing/bathroom;Assistance with cooking/housework;Assist for transportation;Help with stairs or ramp for entrance    Functional Status Assessment  Patient has had a recent decline in their functional status and demonstrates the ability to make significant improvements in function in a reasonable and predictable amount of time.  Equipment Recommendations  None recommended by OT    Recommendations for Other Services       Precautions / Restrictions Precautions Precautions: Fall Restrictions Weight Bearing Restrictions: No Other Position/Activity Restrictions: Bed bugs      Mobility Bed  Mobility Overal bed mobility: Needs Assistance Bed Mobility: Supine to Sit, Sit to Supine     Supine to sit: Modified independent (Device/Increase time) Sit to supine: Modified independent (Device/Increase time)   General bed mobility comments: Shawn [name] able to transfer supine<>sitting with increased time and effort but no physical assistance    Transfers Overall transfer level: Needs assistance Equipment used: 1 person hand held assist Transfers: Sit to/from Stand Sit to Stand: Min assist           General transfer comment: Shawn [name] required encouragement to attempt standing, saying he was too weak, but eventually agreed. Able to come into full standing posture with Min A. Needed to quickly sit down again.      Balance Overall balance assessment: Needs assistance   Sitting balance-Leahy Scale: Good     Standing balance support: Single extremity supported Standing balance-Leahy Scale: Fair Standing balance comment: Able to maintain standing balance for ~ 10 seconds before returning to EOB sit                           ADL either performed or assessed with clinical judgement   ADL Overall ADL's : Needs assistance/impaired                     Lower Body Dressing: Maximal assistance Lower Body Dressing Details (indicate cue type and reason): Max A for donning socks. Shawn [name] reports sister does this for him at home. He states he is able to get his slide-on shoes on w/o assist.           Tub/Shower Transfer Details (indicate cue type and reason): Shawn [name] uses sponge-bathing from bed  or WC at baseline         Vision         Perception         Praxis         Pertinent Vitals/Pain Pain Assessment Pain Assessment: No/denies pain     Extremity/Trunk Assessment Upper Extremity Assessment Upper Extremity Assessment: Generalized weakness   Lower Extremity Assessment Lower Extremity Assessment: Generalized weakness       Communication  Communication Communication: No apparent difficulties   Cognition Arousal: Alert Behavior During Therapy: WFL for tasks assessed/performed Overall Cognitive Status: Within Functional Limits for tasks assessed                                       General Comments       Exercises Other Exercises Other Exercises: Educ re: DM management, medication mgmt, importance of physcial activity/strength building   Shoulder Instructions      Home Living Family/patient expects to be discharged to:: Private residence Living Arrangements: Other relatives Available Help at Discharge: Family;Available 24 hours/day Type of Home: House Home Access: Ramped entrance     Home Layout: One level     Bathroom Shower/Tub: Chief Strategy Officer: Handicapped height     Home Equipment: Scientist, research (medical) (4 wheels);Cane - single point;BSC/3in1   Additional Comments: Lives with sister with 24/7 supervision between sister and PCA that comes around 2hrs/day      Prior Functioning/Environment Prior Level of Function : Needs assist             Mobility Comments: Shawn [name] reports to OT that he uses a WC, stands only for transfers to/from bed, WC, BSC ADLs Comments: Sister/PCA assist with ADLs. Shawn [name] reports he uses a BSC for toileting and sister assists him with sponge bathing, as he is unable to access the bathroom in their house.        OT Problem List: Decreased strength;Decreased activity tolerance;Impaired balance (sitting and/or standing);Decreased range of motion      OT Treatment/Interventions: Therapeutic exercise;Self-care/ADL training;DME and/or AE instruction;Therapeutic activities;Energy conservation;Neuromuscular education;Patient/family education;Balance training    OT Goals(Current goals can be found in the care plan section) Acute Rehab OT Goals Patient Stated Goal: to go home OT Goal Formulation: With patient Time For Goal Achievement:  09/18/22 Potential to Achieve Goals: Good ADL Goals Shawn [name] Will Perform Lower Body Dressing: with min assist;with adaptive equipment Shawn [name] Will Transfer to Toilet: with contact guard assist;stand pivot transfer;bedside commode Shawn [name]/caregiver will Perform Home Exercise Program: Increased ROM;Increased strength;Right Upper extremity;Left upper extremity;With theraband;Independently  OT Frequency: Min 1X/week    Co-evaluation              AM-PAC OT "6 Clicks" Daily Activity     Outcome Measure Help from another person eating meals?: None Help from another person taking care of personal grooming?: A Little Help from another person toileting, which includes using toliet, bedpan, or urinal?: A Lot Help from another person bathing (including washing, rinsing, drying)?: A Lot Help from another person to put on and taking off regular upper body clothing?: A Little Help from another person to put on and taking off regular lower body clothing?: A Lot 6 Click Score: 16   End of Session    Activity Tolerance: Patient tolerated treatment well Patient left: in bed;with call bell/phone within reach  OT Visit Diagnosis: Unsteadiness on feet (R26.81);History of falling (Z91.81);Muscle  weakness (generalized) (M62.81)                Time: 4540-9811 OT Time Calculation (min): 27 min Charges:  OT General Charges $OT Visit: 1 Visit OT Evaluation $OT Eval Moderate Complexity: 1 Mod OT Treatments $Self Care/Home Management : 8-22 mins Latina Craver, PhD, MS, OTR/L 09/04/22, 11:28 AM

## 2022-09-04 NOTE — [place] FL2 (Signed)
[place] MEDICAID FL2 LEVEL OF CARE FORM     IDENTIFICATION  Patient Name: Shawn [name] Birthdate: 1955/01/07 Sex: male Admission Date (Current Location): 09/03/2022  [place] and [place] Number:  Chiropodist and Address:  [facility], [address] [place], [place] [address]      Provider Number: [id number]  Attending Physician Name and Address:  Darlin Priestly, MD  Relative Name and Phone Number:  JERAMEE, CANCILLA (Sister)  [phone] [facility])    Current Level of Care: Hospital Recommended Level of Care: Skilled Nursing Facility Prior Approval Number:    Date Approved/Denied:   PASRR Number: [id number]  Discharge Plan:      Current Diagnoses: Patient Active Problem List   Diagnosis Date Noted   Sepsis ([name]) 09/03/2022   HFrEF (heart failure with reduced ejection fraction) ([name]) 09/03/2022   Nausea and vomiting 08/30/2022   ESRD on dialysis ([name]) 08/29/2022   DKA (diabetic ketoacidosis) ([name]) 08/28/2022   Acute exacerbation of CHF (congestive heart failure) ([name]) 08/13/2021   Weakness 08/13/2021   Iron deficiency anemia 07/01/2021   Hyperosmolar hyperglycemic state (HHS) ([name]) 03/16/2020   Acute metabolic encephalopathy 03/16/2020   Elevated troponin 03/16/2020   HLD (hyperlipidemia) 03/16/2020   Abnormal CXR 03/16/2020   Hypothermia 03/16/2020   Hypotension 04/30/2019   Stroke ([name])    Protein-calorie malnutrition, severe ([name]) 04/25/2019   Hyperkalemia 04/23/2019   Acute kidney injury superimposed on CKD 3b ([name]) 04/23/2019   C. difficile colitis 04/23/2019   Hypoglycemia 09/22/2018   Essential hypertension 05/12/2018   Acute on chronic HFrEF (heart failure with reduced ejection fraction) ([name]) 12/15/2017   NICM (nonischemic cardiomyopathy) ([name]) 05/13/2017   Type 2 diabetes mellitus with stage 3b chronic kidney disease, with long-term current use of insulin ([name]) 05/13/2017   Smoker 05/13/2017   Alcohol  abuse 05/13/2017    Orientation RESPIRATION BLADDER Height & Weight     Self, Time, Situation, Place  Normal Continent Weight: 155 lb 3.3 oz (70.4 kg) Height:  5\' 11"  (180.3 cm)  BEHAVIORAL SYMPTOMS/MOOD NEUROLOGICAL BOWEL NUTRITION STATUS      Incontinent Diet (Diet heart healthy/carb modified Room service appropriate? Yes; Fluid consistency: Thin; Fluid restriction: 1200 mL Fluid)  AMBULATORY STATUS COMMUNICATION OF NEEDS Skin     Verbally Skin abrasions, Bruising, Other (Comment) (stage 1 mid coccyx)                       Personal Care Assistance Level of Assistance  Bathing, Feeding, Dressing Bathing Assistance: Maximum assistance Feeding assistance: Limited assistance Dressing Assistance: Maximum assistance     Functional Limitations Info             SPECIAL CARE FACTORS FREQUENCY  PT (By licensed PT), OT (By licensed OT)     PT Frequency: 5 times per week OT Frequency: 5 times per week            Contractures      Additional Factors Info  Code Status, Allergies Code Status Info: full Allergies Info: Empagliflozin, Pravastatin, Simvastatin           Current Medications (09/04/2022):  This is the current hospital active medication list Current Facility-Administered Medications  Medication Dose Route Frequency Provider Last Rate Last Admin   0.9 %  sodium chloride infusion  250 mL Intravenous PRN Floydene Flock, MD       acetaminophen (TYLENOL) tablet 650 mg  650 mg Oral Q6H PRN Floydene Flock, MD  alteplase (CATHFLO ACTIVASE) injection 2 mg  2 mg Intracatheter Once PRN Wendee Beavers, NP       aspirin EC tablet 81 mg  81 mg Oral Daily Floydene Flock, MD   81 mg at 09/04/22 0908   atorvastatin (LIPITOR) tablet 40 mg  40 mg Oral QHS Floydene Flock, MD   40 mg at 09/03/22 2205   ceFEPIme (MAXIPIME) 1 g in sodium chloride 0.9 % 100 mL IVPB  1 g Intravenous Q24H Floydene Flock, MD 200 mL/hr at 09/04/22 0916 1 g at 09/04/22 0916    Chlorhexidine Gluconate Cloth 2 % PADS 6 each  6 each Topical Q0600 Wendee Beavers, NP   6 each at 09/04/22 0529   dextrose (GLUTOSE) oral gel 40%  1 Tube Oral PRN Floydene Flock, MD       heparin injection 1,000 Units  1,000 Units Intracatheter PRN Wendee Beavers, NP       heparin injection 5,000 Units  5,000 Units Subcutaneous Q8H Floydene Flock, MD   5,000 Units at 09/04/22 0529   insulin aspart (novoLOG) injection 0-9 Units  0-9 Units Subcutaneous TID WC Darlin Priestly, MD       insulin glargine-yfgn [facility]) injection 5 Units  5 Units Subcutaneous Q1200 Darlin Priestly, MD       levothyroxine (SYNTHROID) tablet 75 mcg  75 mcg Oral QAC breakfast Floydene Flock, MD   75 mcg at 09/04/22 4098   nicotine (NICODERM CQ - dosed in mg/24 hr) patch 7 mg  7 mg Transdermal Daily Floydene Flock, MD   7 mg at 09/04/22 0910   pantoprazole (PROTONIX) EC tablet 40 mg  40 mg Oral Daily Floydene Flock, MD   40 mg at 09/04/22 0908   sodium chloride flush (NS) 0.9 % injection 3 mL  3 mL Intravenous Q12H Floydene Flock, MD   3 mL at 09/04/22 1191   sodium chloride flush (NS) 0.9 % injection 3 mL  3 mL Intravenous PRN Floydene Flock, MD         Discharge Medications: Please see discharge summary for a list of discharge medications.  Relevant Imaging Results:  Relevant Lab Results:   Additional Information dialysis [facility]; SS #: [phone]  Eros Montour E Delphina Schum, LCSW

## 2022-09-05 DIAGNOSIS — E162 Hypoglycemia, unspecified: Secondary | ICD-10-CM | POA: Diagnosis not present

## 2022-09-05 LAB — GLUCOSE, CAPILLARY
Glucose-Capillary: 136 mg/dL — ABNORMAL HIGH (ref 70–99)
Glucose-Capillary: 259 mg/dL — ABNORMAL HIGH (ref 70–99)
Glucose-Capillary: 188 mg/dL — ABNORMAL HIGH (ref 70–99)
Glucose-Capillary: 188 mg/dL — ABNORMAL HIGH (ref 70–99)

## 2022-09-05 LAB — CBC
HCT: 26.2 % — ABNORMAL LOW (ref 39.0–52.0)
Hemoglobin: 8.8 g/dL — ABNORMAL LOW (ref 13.0–17.0)
MCH: 28.9 pg (ref 26.0–34.0)
MCHC: 33.6 g/dL (ref 30.0–36.0)
MCV: 86.2 fL (ref 80.0–100.0)
Platelets: 229 10*3/uL (ref 150–400)
RBC: 3.04 MIL/uL — ABNORMAL LOW (ref 4.22–5.81)
RDW: 15.5 % (ref 11.5–15.5)
WBC: 4 10*3/uL (ref 4.0–10.5)
nRBC: 0 % (ref 0.0–0.2)

## 2022-09-05 LAB — BASIC METABOLIC PANEL
Anion gap: 14 (ref 5–15)
BUN: 76 mg/dL — ABNORMAL HIGH (ref 8–23)
CO2: 21 mmol/L — ABNORMAL LOW (ref 22–32)
Calcium: 8.1 mg/dL — ABNORMAL LOW (ref 8.9–10.3)
Chloride: 95 mmol/L — ABNORMAL LOW (ref 98–111)
Creatinine, Ser: 7.48 mg/dL — ABNORMAL HIGH (ref 0.61–1.24)
GFR, Estimated: 7 mL/min — ABNORMAL LOW (ref >60)
Glucose, Bld: 146 mg/dL — ABNORMAL HIGH (ref 70–99)
Potassium: 4.8 mmol/L (ref 3.5–5.1)
Sodium: 130 mmol/L — ABNORMAL LOW (ref 135–145)

## 2022-09-05 LAB — MAGNESIUM: Magnesium: 2.4 mg/dL (ref 1.7–2.4)

## 2022-09-05 MED ORDER — INSULIN ASPART 100 UNIT/ML IJ SOLN
3.0000 [IU] | Freq: Three times a day (TID) | INTRAMUSCULAR | Status: DC
  Administered 2022-09-05 – 2022-09-06 (×4): 3 [IU] via SUBCUTANEOUS
  Filled 2022-09-05 (×3): qty 1

## 2022-09-05 NOTE — Progress Notes (Signed)
[facility]  ROUNDING NOTE   Subjective:   [name] was admitted to [facility] on 09/03/2022 for Sinus bradycardia [R00.1] Hypoglycemia [E16.2] Acute UTI [N39.0] Hypothermia, initial encounter [T68.XXXA] Sepsis [facility]) [A41.9]  Patient is known to our practice and receives dialysis at [facility] on a MWF schedule, He was recently admitted for hyperkalemia and discharge on 08/31/22. He presented again for hypoglycemia and found to have sepsis.   Patient seen sitting up in bed   No family present Finished eating breakfast, denies nausea or vomiting Just completed dialysis this morning.  1500 cc removed.  Objective:  Vital signs in last 24 hours:  Temp:  [97.1 F (36.2 C)-99.4 F (37.4 C)] 99.1 F (37.3 C) (09/01 1100) Pulse Rate:  [34-75] 75 (09/01 1100) Resp:  [16-20] 16 (09/01 1100) BP: (106-139)/(58-87) 133/77 (09/01 1100) SpO2:  [91 %-100 %] 92 % (09/01 1100)  Weight change:  Filed Weights   09/03/22 0417  Weight: 70.4 kg    Intake/Output: I/O last 3 completed shifts: In: 600 [P.O.:600] Out: -    Intake/Output this shift:  Total I/O In: -  Out: 1500 [Other:1500]  Physical Exam: General: NAD  Head: Normocephalic, atraumatic. Moist oral mucosal membranes  Eyes: Anicteric  Lungs:  Clear to auscultation  Heart: Regular rate and rhythm  Abdomen:  Soft, nontender  Extremities: 1-2+ left > right peripheral edema.  Neurologic: Alert, moving all four extremities  Skin: No lesions  Access: RIJ permcath    Basic Metabolic Panel: Recent Labs  Lab 08/30/22 0511 08/31/22 0546 08/31/22 0836 09/03/22 0422 09/04/22 0527 09/05/22 0504  NA 133* 132*  --  134* 129* 130*  K 3.7 3.7  --  4.3 4.8 4.8  CL 98 99  --  92* 95* 95*  CO2 20* 23  --  24 22 21*  GLUCOSE 73 66*  --  114* 197* 146*  BUN 76* 42*  --  57* 69* 76*  CREATININE 5.68* 4.17*  --  6.45* 6.78* 7.48*  CALCIUM 8.2* 8.3*  --  8.9 8.1* 8.1*  MG 1.6* 1.7 1.8 2.4  --  2.4   PHOS 4.3 3.2  --   --   --   --     Liver Function Tests: Recent Labs  Lab 08/30/22 0511 09/04/22 0527  AST  --  12*  ALT  --  7  ALKPHOS  --  67  BILITOT  --  0.3  PROT  --  5.8*  ALBUMIN 3.0* 2.7*   No results for input(s): "LIPASE", "AMYLASE" in the last 168 hours. No results for input(s): "AMMONIA" in the last 168 hours.  CBC: Recent Labs  Lab 08/30/22 0959 08/31/22 0836 09/03/22 0422 09/04/22 0527 09/05/22 0504  WBC 6.0 3.8* 2.8* 3.7* 4.0  NEUTROABS  --  2.5 2.1  --   --   HGB 9.1* 9.1* 9.9* 9.4* 8.8*  HCT 26.6* 27.6* 29.7* 28.4* 26.2*  MCV 85.0 87.3 87.1 86.9 86.2  PLT 190 148* 170 197 229    Cardiac Enzymes: Recent Labs  Lab 09/03/22 0953  CKTOTAL 89    BNP: Invalid input(s): "POCBNP"  CBG: Recent Labs  Lab 09/04/22 0828 09/04/22 1230 09/04/22 1645 09/04/22 2129 09/05/22 0842  GLUCAP 188* 236* 239* 178* 136*    Microbiology: Results for orders placed or performed during the hospital encounter of 09/03/22  Blood culture (routine x 2)     Status: None (Preliminary result)   Collection Time: 09/03/22  5:33  AM   Specimen: BLOOD LEFT ARM  Result Value Ref Range Status   Specimen Description BLOOD LEFT ARM  Final   Special Requests   Final    BOTTLES DRAWN AEROBIC AND ANAEROBIC Blood Culture adequate volume   Culture   Final    NO GROWTH 2 DAYS Performed at [facility], [address], [place], [place] [address]    Report Status PENDING  Incomplete  SARS Coronavirus 2 by RT PCR (hospital order, performed in [facility] hospital lab) *cepheid single result test* Anterior Nasal Swab     Status: None   Collection Time: 09/03/22  5:33 AM   Specimen: Anterior Nasal Swab  Result Value Ref Range Status   SARS Coronavirus 2 by RT PCR NEGATIVE NEGATIVE Final    Comment: (NOTE) SARS-CoV-2 target nucleic acids are NOT DETECTED.  The SARS-CoV-2 RNA is generally detectable in upper and lower respiratory specimens during the acute phase of  infection. The lowest concentration of SARS-CoV-2 viral copies this assay can detect is 250 copies / mL. A negative result does not preclude SARS-CoV-2 infection and should not be used as the sole basis for treatment or other patient management decisions.  A negative result may occur with improper specimen collection / handling, submission of specimen other than nasopharyngeal swab, presence of viral mutation(s) within the areas targeted by this assay, and inadequate number of viral copies (<250 copies / mL). A negative result must be combined with clinical observations, patient history, and epidemiological information.  Fact Sheet for Patients:   RoadLapTop.co.za  Fact Sheet for Healthcare Providers: http://kim-miller.com/  This test is not yet approved or  cleared by the [place] FDA and has been authorized for detection and/or diagnosis of SARS-CoV-2 by FDA under an Emergency Use Authorization (EUA).  This EUA will remain in effect (meaning this test can be used) for the duration of the COVID-19 declaration under Section 564(b)(1) of the Act, 21 U.S.C. section 360bbb-3(b)(1), unless the authorization is terminated or revoked sooner.  Performed at [facility], [address], [place], [place] [address]   Urine Culture     Status: None   Collection Time: 09/03/22  5:43 AM   Specimen: Urine, Clean Catch  Result Value Ref Range Status   Specimen Description   Final    URINE, CLEAN CATCH Performed at [facility], [address], [place], [place] [address]    Special Requests   Final    NONE Performed at [facility], [address] [place], [place] [address]    Culture   Final    NO GROWTH Performed at [facility], [address], [place], [place] [address]    Report Status 09/04/2022 FINAL  Final  Blood culture (routine x 2)     Status: None (Preliminary result)   Collection Time:  09/03/22  6:30 AM   Specimen: BLOOD  Result Value Ref Range Status   Specimen Description BLOOD BLOOD RIGHT ARM  Final   Special Requests   Final    BOTTLES DRAWN AEROBIC AND ANAEROBIC Blood Culture adequate volume   Culture   Final    NO GROWTH 2 DAYS Performed at [facility], [address], [place], [place] [address]    Report Status PENDING  Incomplete    Coagulation Studies: No results for input(s): "LABPROT", "INR" in the last 72 hours.   Urinalysis: Recent Labs    09/03/22 0543  COLORURINE YELLOW*  LABSPEC 1.019  PHURINE  5.0  GLUCOSEU 150*  HGBUR LARGE*  BILIRUBINUR NEGATIVE  KETONESUR NEGATIVE  PROTEINUR >=300*  NITRITE NEGATIVE  LEUKOCYTESUR MODERATE*      Imaging: No results found.   Medications:    sodium chloride      aspirin EC  81 mg Oral Daily   atorvastatin  40 mg Oral QHS   Chlorhexidine Gluconate Cloth  6 each Topical Q0600   heparin  5,000 Units Subcutaneous Q8H   insulin aspart  0-9 Units Subcutaneous TID WC   insulin aspart  3 Units Subcutaneous TID WC   insulin glargine-yfgn  5 Units Subcutaneous Q1200   levothyroxine  75 mcg Oral QAC breakfast   nicotine  7 mg Transdermal Daily   pantoprazole  40 mg Oral Daily   sodium chloride flush  3 mL Intravenous Q12H   sodium chloride, acetaminophen, alteplase, dextrose, heparin, sodium chloride flush  Assessment/ Plan:  [name] is a 67 y.o.  male with end stage renal disease on hemodialysis, hypertension, diabetes mellitus type II, congestive heart failure, coronary artery disease, CVA, alcohol abuse, tobacco abuse and hypothyroidism who presents to Sinus bradycardia [R00.1] Hypoglycemia [E16.2] Acute UTI [N39.0] Hypothermia, initial encounter [T68.XXXA] Sepsis ([name]) [A41.9]   [facility] MWF [facility] permcath 64kg  End Stage Renal Disease with LE edema -Next treatment scheduled for Monday to maintain outpatient schedule. -Volume removal as  tolerated as he continues to have some edema.   Diabetes mellitus type II with chronic kidney disease:  -Hypoglycemia with EMS.  - Hgb A1c 11 % on 08/29/22  -Currently managed with semglee, NovoLog  Anemia with chronic kidney disease: macrocytic - - ESA with MWF scheduled HD treatments.  - Hgb 8.8, within optimal range - Remains on Mircera at outpatient clinic  4. Secondary Hyperparathyroidism: with outpatient labs: none available.   Lab Results  Component Value Date   CALCIUM 8.1 (L) 09/05/2022   PHOS 3.2 08/31/2022    Will continue to monitor bone minerals during this admission   LOS: 2 Delando Satter 9/1/202411:28 AM

## 2022-09-05 NOTE — Progress Notes (Signed)
Received patient at bedside.  Alert and oriented.  Informed consent signed and in chart.   TX duration: 3.5 Hours  Patient tolerated well.  Patient resting, condition stable  Alert, without acute distress.  Hand-off given to patient's nurse.   Access used: RIJ TDC Access issues: None  Total UF removed: 1500 mL Medication(s) given: None Post HD VS: please see Data Insert Post HD weight: Unable to obtain    09/05/22 0913  Vitals  Temp 99.4 F (37.4 C)  Temp Source Oral  BP 139/61  MAP (mmHg) 82  BP Location Right Arm  BP Method Automatic  Patient Position (if appropriate) Lying  Pulse Rate (!) 37  Pulse Rate Source Monitor  Resp 16  Oxygen Therapy  SpO2 96 %  O2 Device Room Air  Patient Activity (if Appropriate) In bed  Pulse Oximetry Type Intermittent  Post Treatment  Dialyzer Clearance Lightly streaked  Hemodialysis Intake (mL) 0 mL  Liters Processed 73.6  Fluid Removed (mL) 1500 mL  Tolerated HD Treatment Yes  Note  Patient Observations Patient alert, decreased heart rate noted; patient without related c/o, no acute distress noted; patient condition stable at time of this treatment d/c.  Hemodialysis Catheter Right Internal jugular Double lumen Permanent (Tunneled)  No placement date or time found.   Placed prior to admission: Yes  Orientation: Right  Access Location: Internal jugular  Hemodialysis Catheter Type: Double lumen Permanent (Tunneled)  Site Condition No complications  Blue Lumen Status Flushed;Heparin locked;Dead end cap in place  Red Lumen Status Flushed;Heparin locked;Dead end cap in place  Purple Lumen Status N/A  Catheter fill solution Heparin 1000 units/ml  Catheter fill volume (Arterial) 1.8 cc  Catheter fill volume (Venous) 1.8  Dressing Type Transparent  Dressing Status Antimicrobial disc in place;Clean, Dry, Intact  Drainage Description None  Post treatment catheter status Capped and Clamped      [facility]

## 2022-09-05 NOTE — Progress Notes (Signed)
Called Shawn [name] on 2C, patient went to room 202. Gave report. Patient has all of his belongings with him. He is alert and oriented with vitals stable.

## 2022-09-05 NOTE — Progress Notes (Signed)
  PROGRESS NOTE    Shawn [name]  ZOX:[id number] DOB: 05-23-1955 DOA: 09/03/2022 PCP: [facility]  [id number]  LOS: 2 days   Brief hospital course:   Assessment & Plan: Shawn [name] is a 67 y.o. male with medical history significant of HFpEF, ESRD on hemodialysis Monday Wednesday Friday, hypertension, type 2 diabetes, substance abuse, history of CVA presenting with sepsis, hypothermia, hypoglycemia.  Patient reports taking nighttime insulin coverage last night.  Patient states he woke up with severe chills with noted blood sugar in the 20s.  Noted admission August 24 through August 27 for issues including DKA associated with missed insulin dosing, hyperkalemia as well as anasarca.  Patient reports being otherwise stable from discharge.    * Sepsis ([name]), ruled out --no source of infection.  Urine cx neg growth. --d/c'ed abx  Hypothermia, resolved Tmax 90-92, improving with bear hugger Suspect likely due to hypoglycemia  Hypoglycemia DM2 with  Hyperglycemia Pt states his sugar was 135 before bed and took 3 units of insulin. Pt states he woke up not feeling well and called 911. EMS states pts blood sugar 26. --being ESRD, pt would be more sensitive to insulin.  However, DM poorly controlled, A1c 11.0. --BG now trending up Plan: --cont home glargine 5u daily --add mealtime 3u TID --ACHS and SSI  Essential hypertension  --on home torsemide on non-dialysis days  Hx of Iron deficiency anemia Anemia with chronic kidney disease Baseline hgb 8-10  --ESA with MWF scheduled HD treatments.   Smoker 1/4 pack/day smoker Discussed cessation with admitting physician Nicotine patch  HLD (hyperlipidemia) Cont statin   HFrEF (heart failure with reduced ejection fraction) ([name]) 2D echo June 2023 with EF of 20 to 25% Looks fairly euvolemic to mildly dry at present Baseline volume status mediated by predominantly hemodialysis and Lasix in between  sessions  ESRD on dialysis ([name]) MWF -iHD per nephrology   DVT prophylaxis: Heparin SQ Code Status: Full code  Family Communication:  Level of care: Med-Surg Dispo:   The patient is from: home Anticipated d/c is to: SNF rehab Anticipated d/c date is: whenever bed available   Subjective and Interval History:  Pt said he wanted to go home, however, sister said pt needs to go to SNF rehab.   Objective: Vitals:   09/05/22 1049 09/05/22 1100 09/05/22 1225 09/05/22 1648  BP: 133/77 133/77 130/74 115/72  Pulse: 75 75 75 71  Resp: 16 16 20 20   Temp:  99.1 F (37.3 C) 98.8 F (37.1 C) 98.1 F (36.7 C)  TempSrc:      SpO2: 92% 92% 92% 92%  Weight:      Height:        Intake/Output Summary (Last 24 hours) at 09/05/2022 1649 Last data filed at 09/05/2022 0913 Gross per 24 hour  Intake 240 ml  Output 1500 ml  Net -1260 ml   Filed Weights   09/03/22 0417  Weight: 70.4 kg    Examination:   Constitutional: NAD, AAOx3 HEENT: conjunctivae and lids normal, EOMI CV: No cyanosis.   RESP: normal respiratory effort, on RA Neuro: II - XII grossly intact.   Psych: Normal mood and affect.     Data Reviewed: I have personally reviewed labs and imaging studies  Time spent: 35 minutes  Darlin Priestly, MD [facility] If 7PM-7AM, please contact night-coverage 09/05/2022, 4:49 PM

## 2022-09-06 DIAGNOSIS — E162 Hypoglycemia, unspecified: Secondary | ICD-10-CM | POA: Diagnosis not present

## 2022-09-06 LAB — BASIC METABOLIC PANEL
Anion gap: 13 (ref 5–15)
BUN: 52 mg/dL — ABNORMAL HIGH (ref 8–23)
CO2: 24 mmol/L (ref 22–32)
Calcium: 7.9 mg/dL — ABNORMAL LOW (ref 8.9–10.3)
Chloride: 95 mmol/L — ABNORMAL LOW (ref 98–111)
Creatinine, Ser: 5.16 mg/dL — ABNORMAL HIGH (ref 0.61–1.24)
GFR, Estimated: 12 mL/min — ABNORMAL LOW (ref >60)
Glucose, Bld: 148 mg/dL — ABNORMAL HIGH (ref 70–99)
Potassium: 4.1 mmol/L (ref 3.5–5.1)
Sodium: 132 mmol/L — ABNORMAL LOW (ref 135–145)

## 2022-09-06 LAB — CBC
HCT: 28.8 % — ABNORMAL LOW (ref 39.0–52.0)
Hemoglobin: 9.2 g/dL — ABNORMAL LOW (ref 13.0–17.0)
MCH: 29.1 pg (ref 26.0–34.0)
MCHC: 31.9 g/dL (ref 30.0–36.0)
MCV: 91.1 fL (ref 80.0–100.0)
Platelets: 208 10*3/uL (ref 150–400)
RBC: 3.16 MIL/uL — ABNORMAL LOW (ref 4.22–5.81)
RDW: 15.9 % — ABNORMAL HIGH (ref 11.5–15.5)
WBC: 3.6 10*3/uL — ABNORMAL LOW (ref 4.0–10.5)
nRBC: 0 % (ref 0.0–0.2)

## 2022-09-06 LAB — GLUCOSE, CAPILLARY
Glucose-Capillary: 251 mg/dL — ABNORMAL HIGH (ref 70–99)
Glucose-Capillary: 225 mg/dL — ABNORMAL HIGH (ref 70–99)
Glucose-Capillary: 117 mg/dL — ABNORMAL HIGH (ref 70–99)
Glucose-Capillary: 122 mg/dL — ABNORMAL HIGH (ref 70–99)

## 2022-09-06 LAB — MAGNESIUM: Magnesium: 2.1 mg/dL (ref 1.7–2.4)

## 2022-09-06 MED ORDER — ORAL CARE MOUTH RINSE: 15.0000 mL | OROMUCOSAL | Status: DC | PRN

## 2022-09-06 MED ORDER — DIPHENOXYLATE-ATROPINE 2.5-0.025 MG PO TABS
2.0000 | ORAL_TABLET | Freq: Once | ORAL | Status: AC
  Administered 2022-09-06: 2 via ORAL
  Filled 2022-09-06: qty 2

## 2022-09-06 MED ORDER — INSULIN ASPART 100 UNIT/ML IJ SOLN
5.0000 [IU] | Freq: Three times a day (TID) | INTRAMUSCULAR | Status: DC
  Administered 2022-09-07 (×3): 5 [IU] via SUBCUTANEOUS
  Filled 2022-09-06 (×3): qty 1

## 2022-09-06 MED ORDER — TAMSULOSIN HCL 0.4 MG PO CAPS
0.4000 mg | ORAL_CAPSULE | Freq: Every day | ORAL | Status: DC
  Administered 2022-09-06 – 2022-09-08 (×3): 0.4 mg via ORAL
  Filled 2022-09-06 (×3): qty 1

## 2022-09-06 MED ORDER — EPOETIN ALFA 4000 UNIT/ML IJ SOLN
4000.0000 [IU] | INTRAMUSCULAR | Status: DC
  Administered 2022-09-06: 4000 [IU] via INTRAVENOUS
  Filled 2022-09-06 (×2): qty 1

## 2022-09-06 MED ORDER — EPOETIN ALFA 4000 UNIT/ML IJ SOLN
INTRAMUSCULAR | Status: AC
  Filled 2022-09-06: qty 1

## 2022-09-06 NOTE — TOC Progression Note (Addendum)
Transition of Care [facility]) - Progression Note    Patient Details  Name: Shawn [name] MRN: [id number] Date of Birth: [birth year]  Transition of [facility]) CM/SW Contact  Darolyn Rua, [place] Phone Number: 09/06/2022, 4:18 PM  Clinical Narrative:     Per MD patient has decided to go home, CSW has updated patient's sister [name] who reports someone with family will be arriving within the next hour to transport patient home. She reports no additional needs at this time.     Expected Discharge Plan: Skilled Nursing Facility Barriers to Discharge: Continued Medical Work up  Expected Discharge Plan and Services       Living arrangements for the past 2 months: Single Family Home                                       Social Determinants of Health (SDOH) Interventions SDOH Screenings   Food Insecurity: No Food Insecurity (09/03/2022)  Housing: Low Risk  (09/03/2022)  Transportation Needs: No Transportation Needs (09/03/2022)  Utilities: Not At Risk (09/03/2022)  Financial Resource Strain: High Risk (08/24/2019)   Received from [facility], [facility]  Social Connections: Moderately Isolated (08/24/2019)   Received from [facility], [facility]  Stress: No Stress Concern Present (08/24/2019)   Received from [facility], [facility]  Tobacco Use: High Risk (09/03/2022)    Readmission Risk Interventions    09/04/2022   11:28 AM 06/24/2021    2:57 PM 01/08/2021    1:22 PM  Readmission Risk Prevention Plan  Transportation Screening Complete Complete Complete  PCP or Specialist Appt within 3-5 Days Complete    HRI or Home Care Consult Complete    Social Work Consult for Recovery Care Planning/Counseling Complete  Complete  Palliative Care Screening Not Applicable  Not Applicable  Medication Review [employer]) Complete Complete Complete  Palliative Care Screening   Not Applicable   Skilled Nursing Facility  Not Applicable

## 2022-09-06 NOTE — Progress Notes (Signed)
Patient refused in and out cath at 807pm and again at 11pm with on his bladder scan at 11pm.   Dr. Fran Lowes and Dr. Thedore Mins notified of refusal at 807pm and Dr. Para March notified at 1130pm.

## 2022-09-06 NOTE — Plan of Care (Signed)
  Problem: Clinical Measurements: Goal: Signs and symptoms of infection will decrease Outcome: Progressing   Problem: Fluid Volume: Goal: Hemodynamic stability will improve Outcome: Progressing

## 2022-09-06 NOTE — Progress Notes (Signed)
  PROGRESS NOTE    Shawn [name]  RUE:[id number] DOB: 1955-02-21 DOA: 09/03/2022 [facility]CP: [facility]  [id number]  LOS: 3 days   Brief hospital course:   Assessment & Plan: Shawn [name] is a 67 y.o. male with medical history significant of HFpEF, ESRD on hemodialysis Monday Wednesday Friday, hypertension, type 2 diabetes, substance abuse, history of CVA presenting with sepsis, hypothermia, hypoglycemia.  Patient reports taking nighttime insulin coverage last night.  Patient states he woke up with severe chills with noted blood sugar in the 20s.  Noted admission August 24 through August 27 for issues including DKA associated with missed insulin dosing, hyperkalemia as well as anasarca.  Patient reports being otherwise stable from discharge.    * Sepsis ([name]), ruled out --no source of infection.  Urine cx neg growth. --d/c'ed abx  Hypothermia, resolved Tmax 90-92, improving with bear hugger Suspect likely due to hypoglycemia  Hypoglycemia DM2 with  Hyperglycemia Pt states his sugar was 135 before bed and took 3 units of insulin. Pt states he woke up not feeling well and called 911. EMS states pts blood sugar 26. --being ESRD, pt would be more sensitive to insulin.  However, DM poorly controlled, A1c 11.0. --BG now trending up Plan: --cont home glargine 5u daily --increase mealtime to 5u TID --ACHS and SSI  Essential hypertension  --on home torsemide on non-dialysis days  Hx of Iron deficiency anemia Anemia with chronic kidney disease Baseline hgb 8-10  --ESA with MWF scheduled HD treatments.   Smoker 1/4 pack/day smoker Discussed cessation with admitting physician Nicotine patch  HLD (hyperlipidemia) Cont statin   HFrEF (heart failure with reduced ejection fraction) ([name]) 2D echo June 2023 with EF of 20 to 25% Looks fairly euvolemic to mildly dry at present Baseline volume status mediated by predominantly hemodialysis and Lasix in between  sessions  ESRD on dialysis ([name]) MWF -iHD per nephrology   DVT prophylaxis: Heparin SQ Code Status: Full code  Family Communication:  Level of care: Med-Surg Dispo:   The patient is from: home Anticipated d/c is to: home (pt declined SNF rehab) Anticipated d/c date is: likely tomorrow   Subjective and Interval History:  No acute event today.  Pt said his other sister Shawn [name] will take him home.   Objective: Vitals:   09/06/22 1745 09/06/22 1800 09/06/22 1815 09/06/22 1830  BP: 104/69 106/70 105/68 105/60  Pulse: 65 62 62 61  Resp: 16 16 16 16   Temp:    97.9 F (36.6 C)  TempSrc:    Oral  SpO2: 98% 99% 99% 99%  Weight:      Height:        Intake/Output Summary (Last 24 hours) at 09/06/2022 1921 Last data filed at 09/06/2022 1830 Gross per 24 hour  Intake 480 ml  Output 1700 ml  Net -1220 ml   Filed Weights   09/03/22 0417  Weight: 70.4 kg    Examination:   Constitutional: NAD, AAOx3 HEENT: conjunctivae and lids normal, EOMI CV: No cyanosis.   RESP: normal respiratory effort, on RA Neuro: II - XII grossly intact.     Data Reviewed: I have personally reviewed labs and imaging studies  Time spent: 25 minutes  Darlin Priestly, MD [facility] If 7PM-7AM, please contact night-coverage 09/06/2022, 7:21 PM

## 2022-09-06 NOTE — Progress Notes (Signed)
Dialyzed patient bedside  Alert and oriented.  Informed consent signed and in chart.   TX duration: 3.5  Patient tolerated well.  Patient ate dinner right away after dialysis. Alert, without acute distress.  Hand-off given to patient's nurse.   Access used: Dialysis catheter right chest Access issues: None  Total UF removed: Medication(s) given: Epoetin Alfa 4000 units IV Post HD VS: 105/60 Post HD weight: unable to weigh   [name]

## 2022-09-06 NOTE — Progress Notes (Signed)
[facility]  ROUNDING NOTE   Subjective:   [name] was admitted to [facility] on 09/03/2022 for Sinus bradycardia [R00.1] Hypoglycemia [E16.2] Acute UTI [N39.0] Hypothermia, initial encounter [T68.XXXA] Sepsis [facility]) [A41.9]  Patient is known to our practice and receives dialysis at [facility] on a MWF schedule, He was recently admitted for hyperkalemia and discharge on 08/31/22. He presented again for hypoglycemia and found to have sepsis.   Patient seen sitting up in bed   No family present Finished eating breakfast, denies nausea or vomiting Abdomen is noted to be quite distended today.  Objective:  Vital signs in last 24 hours:  Temp:  [98.1 F (36.7 C)-99.1 F (37.3 C)] 98.4 F (36.9 C) (09/02 0843) Pulse Rate:  [65-79] 68 (09/02 0843) Resp:  [16-20] 18 (09/02 0843) BP: (115-136)/(72-88) 136/82 (09/02 0843) SpO2:  [92 %-100 %] 93 % (09/02 0843)  Weight change:  Filed Weights   09/03/22 0417  Weight: 70.4 kg    Intake/Output: I/O last 3 completed shifts: In: 240 [P.O.:240] Out: 1500 [Other:1500]   Intake/Output this shift:  No intake/output data recorded.  Physical Exam: General: NAD  Head: Normocephalic, atraumatic. Moist oral mucosal membranes  Eyes: Anicteric  Lungs:  Clear to auscultation  Heart: Regular rate and rhythm  Abdomen:  Soft, nontender, distended  Extremities: 1-2+ left > right peripheral edema.  Neurologic: Alert, moving all four extremities  Skin: No lesions  Access: RIJ permcath    Basic Metabolic Panel: Recent Labs  Lab 08/31/22 0546 08/31/22 0836 09/03/22 0422 09/04/22 0527 09/05/22 0504 09/06/22 0330  NA 132*  --  134* 129* 130* 132*  K 3.7  --  4.3 4.8 4.8 4.1  CL 99  --  92* 95* 95* 95*  CO2 23  --  24 22 21* 24  GLUCOSE 66*  --  114* 197* 146* 148*  BUN 42*  --  57* 69* 76* 52*  CREATININE 4.17*  --  6.45* 6.78* 7.48* 5.16*  CALCIUM 8.3*  --  8.9 8.1* 8.1* 7.9*  MG 1.7 1.8 2.4  --  2.4  2.1  PHOS 3.2  --   --   --   --   --     Liver Function Tests: Recent Labs  Lab 09/04/22 0527  AST 12*  ALT 7  ALKPHOS 67  BILITOT 0.3  PROT 5.8*  ALBUMIN 2.7*   No results for input(s): "LIPASE", "AMYLASE" in the last 168 hours. No results for input(s): "AMMONIA" in the last 168 hours.  CBC: Recent Labs  Lab 08/31/22 0836 09/03/22 0422 09/04/22 0527 09/05/22 0504 09/06/22 0330  WBC 3.8* 2.8* 3.7* 4.0 3.6*  NEUTROABS 2.5 2.1  --   --   --   HGB 9.1* 9.9* 9.4* 8.8* 9.2*  HCT 27.6* 29.7* 28.4* 26.2* 28.8*  MCV 87.3 87.1 86.9 86.2 91.1  PLT 148* 170 197 229 208    Cardiac Enzymes: Recent Labs  Lab 09/03/22 0953  CKTOTAL 89    BNP: Invalid input(s): "POCBNP"  CBG: Recent Labs  Lab 09/05/22 0842 09/05/22 1226 09/05/22 1650 09/05/22 2125 09/06/22 0847  GLUCAP 136* 259* 188* 188* 251*    Microbiology: Results for orders placed or performed during the hospital encounter of 09/03/22  Blood culture (routine x 2)     Status: None (Preliminary result)   Collection Time: 09/03/22  5:33 AM   Specimen: BLOOD LEFT ARM  Result Value Ref Range Status   Specimen Description BLOOD LEFT ARM  Final   Special Requests   Final    BOTTLES DRAWN AEROBIC AND ANAEROBIC Blood Culture adequate volume   Culture   Final    NO GROWTH 3 DAYS Performed at [facility], [address] [place], [place] [address]    Report Status PENDING  Incomplete  SARS Coronavirus 2 by RT PCR (hospital order, performed in [facility] hospital lab) *cepheid single result test* Anterior Nasal Swab     Status: None   Collection Time: 09/03/22  5:33 AM   Specimen: Anterior Nasal Swab  Result Value Ref Range Status   SARS Coronavirus 2 by RT PCR NEGATIVE NEGATIVE Final    Comment: (NOTE) SARS-CoV-2 target nucleic acids are NOT DETECTED.  The SARS-CoV-2 RNA is generally detectable in upper and lower respiratory specimens during the acute phase of infection. The lowest concentration  of SARS-CoV-2 viral copies this assay can detect is 250 copies / mL. A negative result does not preclude SARS-CoV-2 infection and should not be used as the sole basis for treatment or other patient management decisions.  A negative result may occur with improper specimen collection / handling, submission of specimen other than nasopharyngeal swab, presence of viral mutation(s) within the areas targeted by this assay, and inadequate number of viral copies (<250 copies / mL). A negative result must be combined with clinical observations, patient history, and epidemiological information.  Fact Sheet for Patients:   RoadLapTop.co.za  Fact Sheet for Healthcare Providers: http://kim-miller.com/  This test is not yet approved or  cleared by the [place] FDA and has been authorized for detection and/or diagnosis of SARS-CoV-2 by FDA under an Emergency Use Authorization (EUA).  This EUA will remain in effect (meaning this test can be used) for the duration of the COVID-19 declaration under Section 564(b)(1) of the Act, 21 U.S.C. section 360bbb-3(b)(1), unless the authorization is terminated or revoked sooner.  Performed at [facility], [address] [place], [place] [address]   Urine Culture     Status: None   Collection Time: 09/03/22  5:43 AM   Specimen: Urine, Clean Catch  Result Value Ref Range Status   Specimen Description   Final    URINE, CLEAN CATCH Performed at [facility], [address] [place], [place] [address]    Special Requests   Final    NONE Performed at [facility], [address] [place], [place] [address]    Culture   Final    NO GROWTH Performed at [facility], [address], [place], [place] [address]    Report Status 09/04/2022 FINAL  Final  Blood culture (routine x 2)     Status: None (Preliminary result)   Collection Time: 09/03/22  6:30 AM   Specimen: BLOOD   Result Value Ref Range Status   Specimen Description BLOOD BLOOD RIGHT ARM  Final   Special Requests   Final    BOTTLES DRAWN AEROBIC AND ANAEROBIC Blood Culture adequate volume   Culture   Final    NO GROWTH 3 DAYS Performed at [facility], [address], [place], [place] [address]    Report Status PENDING  Incomplete    Coagulation Studies: No results for input(s): "LABPROT", "INR" in the last 72 hours.   Urinalysis: No results for input(s): "COLORURINE", "LABSPEC", "PHURINE", "GLUCOSEU", "HGBUR", "BILIRUBINUR", "KETONESUR", "PROTEINUR", "UROBILINOGEN", "NITRITE", "LEUKOCYTESUR" in the last 72 hours.  Invalid input(s): "APPERANCEUR"     Imaging: No results found.   Medications:  sodium chloride      aspirin EC  81 mg Oral Daily   atorvastatin  40 mg Oral QHS   Chlorhexidine Gluconate Cloth  6 each Topical Q0600   heparin  5,000 Units Subcutaneous Q8H   insulin aspart  0-9 Units Subcutaneous TID WC   insulin aspart  3 Units Subcutaneous TID WC   insulin glargine-yfgn  5 Units Subcutaneous Q1200   levothyroxine  75 mcg Oral QAC breakfast   nicotine  7 mg Transdermal Daily   pantoprazole  40 mg Oral Daily   sodium chloride flush  3 mL Intravenous Q12H   sodium chloride, acetaminophen, alteplase, dextrose, heparin, mouth rinse, sodium chloride flush  Assessment/ Plan:  Mr. ADAL HANSLEY is a 67 y.o.  male with end stage renal disease on hemodialysis, hypertension, diabetes mellitus type II, congestive heart failure, coronary artery disease, CVA, alcohol abuse, tobacco abuse and hypothyroidism who presents to Sinus bradycardia [R00.1] Hypoglycemia [E16.2] Acute UTI [N39.0] Hypothermia, initial encounter [T68.XXXA] Sepsis ([name]) [A41.9]   [facility] MWF [facility] permcath 64kg  End Stage Renal Disease with LE edema -Next treatment scheduled for Monday to maintain outpatient schedule. -Volume removal as tolerated as he continues to  have some edema. -Dialysis is done at bedside due to contact restrictions placed by nursing for bedbugs.   Diabetes mellitus type II with chronic kidney disease:  -Hypoglycemia with EMS.  - Hgb A1c 11 % on 08/29/22  -Currently managed with semglee, NovoLog  Anemia with chronic kidney disease: macrocytic - - ESA with MWF scheduled HD treatments.  - Hgb 9.2, within optimal range - Remains on Mircera at outpatient clinic -Continue low-dose Epogen with dialysis.  4. Secondary Hyperparathyroidism: with outpatient labs: none available.   Lab Results  Component Value Date   CALCIUM 7.9 (L) 09/06/2022   PHOS 3.2 08/31/2022    Will continue to monitor bone minerals during this admission   LOS: 3 Gohan Collister 9/2/202410:30 AM

## 2022-09-06 NOTE — TOC Progression Note (Signed)
Transition of Care [facility]) - Progression Note    Patient Details  Name: Shawn [name] MRN: [id number] Date of Birth: Oct 24, 1955  Transition of Care [facility]) CM/SW Contact  Darolyn Rua, [place] Phone Number: 09/06/2022, 1:09 PM  Clinical Narrative:     Patient has bed offer from [facility], CSW spoke with patient about offer and he reports he is agreeable to go for STR.   CSW has started insurance auth, [place] [employer] with [facility].   Pending Auth for [facility] at this time.   Expected Discharge Plan: Skilled Nursing Facility Barriers to Discharge: Continued Medical Work up  Expected Discharge Plan and Services       Living arrangements for the past 2 months: Single Family Home                                       Social Determinants of Health (SDOH) Interventions SDOH Screenings   Food Insecurity: No Food Insecurity (09/03/2022)  Housing: Low Risk  (09/03/2022)  Transportation Needs: No Transportation Needs (09/03/2022)  Utilities: Not At Risk (09/03/2022)  Financial Resource Strain: High Risk (08/24/2019)   Received from [facility], [facility]  Social Connections: Moderately Isolated (08/24/2019)   Received from [facility], [facility]  Stress: No Stress Concern Present (08/24/2019)   Received from [facility], [facility]  Tobacco Use: High Risk (09/03/2022)    Readmission Risk Interventions    09/04/2022   11:28 AM 06/24/2021    2:57 PM 01/08/2021    1:22 PM  Readmission Risk Prevention Plan  Transportation Screening Complete Complete Complete  PCP or Specialist Appt within 3-5 Days Complete    HRI or Home Care Consult Complete    Social Work Consult for Recovery Care Planning/Counseling Complete  Complete  Palliative Care Screening Not Applicable  Not Applicable  Medication Review Oceanographer) Complete Complete Complete  Palliative Care  Screening  Not Applicable   Skilled Nursing Facility  Not Applicable

## 2022-09-07 DIAGNOSIS — E162 Hypoglycemia, unspecified: Secondary | ICD-10-CM | POA: Diagnosis not present

## 2022-09-07 LAB — GLUCOSE, CAPILLARY
Glucose-Capillary: 197 mg/dL — ABNORMAL HIGH (ref 70–99)
Glucose-Capillary: 200 mg/dL — ABNORMAL HIGH (ref 70–99)
Glucose-Capillary: 134 mg/dL — ABNORMAL HIGH (ref 70–99)
Glucose-Capillary: 80 mg/dL (ref 70–99)

## 2022-09-07 MED ORDER — INSULIN ASPART 100 UNIT/ML IJ SOLN
6.0000 [IU] | Freq: Three times a day (TID) | INTRAMUSCULAR | Status: DC
  Administered 2022-09-08 – 2022-09-09 (×4): 6 [IU] via SUBCUTANEOUS
  Filled 2022-09-07 (×4): qty 1

## 2022-09-07 MED ORDER — LORAZEPAM 2 MG/ML IJ SOLN
2.0000 mg | Freq: Once | INTRAMUSCULAR | Status: AC
  Administered 2022-09-07: 2 mg via INTRAVENOUS
  Filled 2022-09-07: qty 1

## 2022-09-07 NOTE — Progress Notes (Signed)
[facility]  ROUNDING NOTE   Subjective:   [name] was admitted to [facility] on 09/03/2022 for Sinus bradycardia [R00.1] Hypoglycemia [E16.2] Acute UTI [N39.0] Hypothermia, initial encounter [T68.XXXA] Sepsis [facility]) [A41.9]  Patient is known to our practice and receives dialysis at [facility] on a MWF schedule, He was recently admitted for hyperkalemia and discharge on 08/31/22. He presented again for hypoglycemia and found to have sepsis.   Patient laying in bed Alert Nursing notes state he has been refusing I&O cath Unable to verbalize reason for refusal  Objective:  Vital signs in last 24 hours:  Temp:  [97.9 F (36.6 C)-98.4 F (36.9 C)] 98.4 F (36.9 C) (09/03 0909) Pulse Rate:  [59-65] 65 (09/03 0909) Resp:  [16-18] 18 (09/03 0909) BP: (93-123)/(60-76) 123/76 (09/03 0909) SpO2:  [95 %-100 %] 95 % (09/03 0909)  Weight change:  Filed Weights   09/03/22 0417  Weight: 70.4 kg    Intake/Output: I/O last 3 completed shifts: In: 480 [P.O.:480] Out: 1700 [Other:1700]   Intake/Output this shift:  No intake/output data recorded.  Physical Exam: General: NAD  Head: Normocephalic, atraumatic. Moist oral mucosal membranes  Eyes: Anicteric  Lungs:  Clear to auscultation  Heart: Regular rate and rhythm  Abdomen:  Soft, nontender, distended  Extremities: 1-2+ left > right peripheral edema.  Neurologic: Alert, moving all four extremities  Skin: No lesions  Access: RIJ permcath    Basic Metabolic Panel: Recent Labs  Lab 09/03/22 0422 09/04/22 0527 09/05/22 0504 09/06/22 0330  NA 134* 129* 130* 132*  K 4.3 4.8 4.8 4.1  CL 92* 95* 95* 95*  CO2 24 22 21* 24  GLUCOSE 114* 197* 146* 148*  BUN 57* 69* 76* 52*  CREATININE 6.45* 6.78* 7.48* 5.16*  CALCIUM 8.9 8.1* 8.1* 7.9*  MG 2.4  --  2.4 2.1    Liver Function Tests: Recent Labs  Lab 09/04/22 0527  AST 12*  ALT 7  ALKPHOS 67  BILITOT 0.3  PROT 5.8*  ALBUMIN 2.7*   No  results for input(s): "LIPASE", "AMYLASE" in the last 168 hours. No results for input(s): "AMMONIA" in the last 168 hours.  CBC: Recent Labs  Lab 09/03/22 0422 09/04/22 0527 09/05/22 0504 09/06/22 0330  WBC 2.8* 3.7* 4.0 3.6*  NEUTROABS 2.1  --   --   --   HGB 9.9* 9.4* 8.8* 9.2*  HCT 29.7* 28.4* 26.2* 28.8*  MCV 87.1 86.9 86.2 91.1  PLT 170 197 229 208    Cardiac Enzymes: Recent Labs  Lab 09/03/22 0953  CKTOTAL 89    BNP: Invalid input(s): "POCBNP"  CBG: Recent Labs  Lab 09/06/22 1223 09/06/22 1720 09/06/22 2228 09/07/22 0907 09/07/22 1145  GLUCAP 225* 117* 122* 197* 200*    Microbiology: Results for orders placed or performed during the hospital encounter of 09/03/22  Blood culture (routine x 2)     Status: None (Preliminary result)   Collection Time: 09/03/22  5:33 AM   Specimen: BLOOD LEFT ARM  Result Value Ref Range Status   Specimen Description BLOOD LEFT ARM  Final   Special Requests   Final    BOTTLES DRAWN AEROBIC AND ANAEROBIC Blood Culture adequate volume   Culture   Final    NO GROWTH 4 DAYS Performed at [facility], [address] [place], [place] [address]    Report Status PENDING  Incomplete  SARS Coronavirus 2 by RT PCR (hospital order, performed in [facility] hospital lab) *cepheid  single result test* Anterior Nasal Swab     Status: None   Collection Time: 09/03/22  5:33 AM   Specimen: Anterior Nasal Swab  Result Value Ref Range Status   SARS Coronavirus 2 by RT PCR NEGATIVE NEGATIVE Final    Comment: (NOTE) SARS-CoV-2 target nucleic acids are NOT DETECTED.  The SARS-CoV-2 RNA is generally detectable in upper and lower respiratory specimens during the acute phase of infection. The lowest concentration of SARS-CoV-2 viral copies this assay can detect is 250 copies / mL. A negative result does not preclude SARS-CoV-2 infection and should not be used as the sole basis for treatment or other patient management decisions.  A  negative result may occur with improper specimen collection / handling, submission of specimen other than nasopharyngeal swab, presence of viral mutation(s) within the areas targeted by this assay, and inadequate number of viral copies (<250 copies / mL). A negative result must be combined with clinical observations, patient history, and epidemiological information.  Fact Sheet for Patients:   RoadLapTop.co.za  Fact Sheet for Healthcare Providers: http://kim-miller.com/  This test is not yet approved or  cleared by the [place] FDA and has been authorized for detection and/or diagnosis of SARS-CoV-2 by FDA under an Emergency Use Authorization (EUA).  This EUA will remain in effect (meaning this test can be used) for the duration of the COVID-19 declaration under Section 564(b)(1) of the Act, 21 U.S.C. section 360bbb-3(b)(1), unless the authorization is terminated or revoked sooner.  Performed at [facility], [address], [place], [place] [address]   Urine Culture     Status: None   Collection Time: 09/03/22  5:43 AM   Specimen: Urine, Clean Catch  Result Value Ref Range Status   Specimen Description   Final    URINE, CLEAN CATCH Performed at [facility], [address] [place], [place] [address]    Special Requests   Final    NONE Performed at [facility] [address] [place], [place] [address]    Culture   Final    NO GROWTH Performed at [facility], [address], [place], [place] [address]    Report Status 09/04/2022 FINAL  Final  Blood culture (routine x 2)     Status: None (Preliminary result)   Collection Time: 09/03/22  6:30 AM   Specimen: BLOOD  Result Value Ref Range Status   Specimen Description BLOOD BLOOD RIGHT ARM  Final   Special Requests   Final    BOTTLES DRAWN AEROBIC AND ANAEROBIC Blood Culture adequate volume   Culture   Final    NO GROWTH 4  DAYS Performed at [facility], [address], [place], [place] [address]    Report Status PENDING  Incomplete    Coagulation Studies: No results for input(s): "LABPROT", "INR" in the last 72 hours.   Urinalysis: No results for input(s): "COLORURINE", "LABSPEC", "PHURINE", "GLUCOSEU", "HGBUR", "BILIRUBINUR", "KETONESUR", "PROTEINUR", "UROBILINOGEN", "NITRITE", "LEUKOCYTESUR" in the last 72 hours.  Invalid input(s): "APPERANCEUR"     Imaging: No results found.   Medications:    sodium chloride      aspirin EC  81 mg Oral Daily   atorvastatin  40 mg Oral QHS   Chlorhexidine Gluconate Cloth  6 each Topical Q0600   epoetin (EPOGEN/PROCRIT) injection  4,000 Units Intravenous Q M,W,F-HD   heparin  5,000 Units Subcutaneous Q8H   insulin aspart  0-9 Units Subcutaneous TID WC   insulin aspart  5  Units Subcutaneous TID WC   insulin glargine-yfgn  5 Units Subcutaneous Q1200   levothyroxine  75 mcg Oral QAC breakfast   nicotine  7 mg Transdermal Daily   pantoprazole  40 mg Oral Daily   sodium chloride flush  3 mL Intravenous Q12H   tamsulosin  0.4 mg Oral QPC supper   sodium chloride, acetaminophen, alteplase, dextrose, heparin, mouth rinse, sodium chloride flush  Assessment/ Plan:  [name] is a 67 y.o.  male with end stage renal disease on hemodialysis, hypertension, diabetes mellitus type II, congestive heart failure, coronary artery disease, CVA, alcohol abuse, tobacco abuse and hypothyroidism who presents to Sinus bradycardia [R00.1] Hypoglycemia [E16.2] Acute UTI [N39.0] Hypothermia, initial encounter [T68.XXXA] Sepsis ([name]) [A41.9]   [facility] M[facility] RIJ permcath 64kg  End Stage Renal Disease with LE edema. -Next treatment scheduled for Wednesday - Will continue to manage fluid volume with dialysis -Dialysis is done at bedside due to contact restrictions placed by nursing for bedbugs.   Diabetes mellitus type II with chronic  kidney disease:  -Hypoglycemia with EMS.  - Hgb A1c 11 % on 08/29/22  -Currently managed with semglee, NovoLog - glucose elevated at times  Anemia with chronic kidney disease: macrocytic - ESA with MWF scheduled HD treatments.  - Hgb 9.2, on 09/06/22 - Remains on Mircera at outpatient clinic -Continue low-dose Epogen with dialysis.  4. Secondary Hyperparathyroidism: with outpatient labs: none available.   Lab Results  Component Value Date   CALCIUM 7.9 (L) 09/06/2022   PHOS 3.2 08/31/2022    Phosphorus within desired range.    LOS: 4 Camilia Caywood 9/3/20242:18 PM

## 2022-09-07 NOTE — Progress Notes (Signed)
PROGRESS NOTE    Shawn [name]  QIO:[id number] DOB: [birth year] DOA: 09/03/2022 [facility]CP: [facility]  [id number]  LOS: 4 days   Brief hospital course:   Assessment & Plan: Shawn [name] is a 67 y.o. male with medical history significant of HFpEF, ESRD on hemodialysis Monday Wednesday Friday, hypertension, type 2 diabetes, substance abuse, history of CVA presenting with sepsis, hypothermia, hypoglycemia.    Patient reports taking nighttime insulin coverage last night.  Patient states he woke up with severe chills with noted blood sugar in the 20s.  Noted admission August 24 through August 27 for issues including DKA associated with missed insulin dosing, hyperkalemia as well as anasarca.     * Sepsis ([name]), ruled out --no source of infection.  Urine cx neg growth. --d/c'ed abx  Hypothermia, resolved Tmax 90-92, improving with bear hugger Suspect likely due to hypoglycemia  Hypoglycemia DM2 with  Hyperglycemia Pt states his sugar was 135 before bed and took 3 units of insulin. Pt states he woke up not feeling well and called 911. EMS states pts blood sugar 26. --being ESRD, pt would be more sensitive to insulin.  However, DM poorly controlled, A1c 11.0. --BG now trending up Plan: --cont home glargine 5u daily --increase mealtime to 6u TID --ACHS and SSI --stress to pt that he should only take short-acting insulin when he eats a meal  Essential hypertension  --on home torsemide on non-dialysis days  Hx of Iron deficiency anemia Anemia with chronic kidney disease Baseline hgb 8-10  --ESA with MWF scheduled HD treatments.   Smoker 1/4 pack/day smoker Discussed cessation with admitting physician Nicotine patch  HLD (hyperlipidemia) Cont statin   HFrEF (heart failure with reduced ejection fraction) ([name]) 2D echo June 2023 with EF of 20 to 25% Baseline volume status mediated by predominantly hemodialysis   ESRD on dialysis ([name]) MWF -iHD per  nephrology  Abdominal distention --initially thought to be due to urinary retention, however, I/O cath with only ~20 ml urine obtained. --pt had CT a/p on 08/28/22 from last hospitalization that showed large ascites. --US paracentesis ordered today    DVT prophylaxis: Heparin SQ Code Status: Full code  Family Communication: sister Shawn [name] updated on the phone today Level of care: Med-Surg Dispo:   The patient is from: home Anticipated d/c is to: home (pt declined SNF rehab) Anticipated d/c date is: possible tomorrow after paracentesis   Subjective and Interval History:  There was concern for urinary retention during dialysis yesterday, so discharge was canceled.  Bladder scan showed increasing volume, however, today when I/O was performed, only ~20 ml urine obtained.  After review of past hospitalization, it was found that pt had CT a/p on 08/28/22 that showed large ascites, and has had paracentesis done in the past.  US paracentesis ordered.   Objective: Vitals:   09/06/22 2029 09/07/22 0909 09/07/22 1608 09/07/22 2100  BP: 105/74 123/76 101/68 106/78  Pulse: 64 65 60 63  Resp: 16 18 18 18   Temp: 97.9 F (36.6 C) 98.4 F (36.9 C) (!) 97.2 F (36.2 C) 98.1 F (36.7 C)  TempSrc: Oral Oral  Oral  SpO2: 100% 95% 94% 98%  Weight:      Height:        Intake/Output Summary (Last 24 hours) at 09/07/2022 2102 Last data filed at 09/07/2022 1345 Gross per 24 hour  Intake --  Output 25 ml  Net -25 ml   Filed Weights   09/03/22 0417  Weight: 70.4 kg  Examination:   Constitutional: NAD HEENT: conjunctivae and lids normal, EOMI CV: No cyanosis.   RESP: normal respiratory effort, on RA Neuro: II - XII grossly intact.     Data Reviewed: I have personally reviewed labs and imaging studies  Time spent: 35 minutes  Darlin Priestly, MD [facility] If 7PM-7AM, please contact night-coverage 09/07/2022, 9:02 PM

## 2022-09-07 NOTE — Progress Notes (Signed)
PT Cancellation Note  Patient Details Name: Shawn [name] MRN: [id number] DOB: [birth year]   Cancelled Treatment:     Pt refused PT at this time stating he "wants to go home" and does not want "to do exercises". Communicated with Pt if PT was necessary for him once discharged but Pt does not feel he needs PT at this time [facility]. Will attempt PT visit at a later time.   [name] 09/07/2022, 9:54 AM

## 2022-09-07 NOTE — Inpatient Diabetes Management (Signed)
Inpatient Diabetes Program Recommendations  AACE/ADA: New Consensus Statement on Inpatient Glycemic Control (2015)  Target Ranges:  Prepandial:   less than 140 mg/dL      Peak postprandial:   less than 180 mg/dL (1-2 hours)      Critically ill patients:  140 - 180 mg/dL    Latest Reference Range & Units 09/06/22 08:47 09/06/22 12:23 09/06/22 17:20 09/06/22 22:28  Glucose-Capillary 70 - 99 mg/dL 161 (H) 096 (H) 045 (H) 122 (H)  (H): Data is abnormally high  Latest Reference Range & Units 09/07/22 09:07  Glucose-Capillary 70 - 99 mg/dL 409 (H)  (H): Data is abnormally high   Admit with: Hypoglycemia Pt states his sugar was 135 before bed and took 3 units of insulin. Pt states he woke up not feeling well and called 911. Ems states pts blood sugar 26 on the truck   History: DM, ESRD  Home DM Meds: Semglee 5 units Daily  Current Orders: Semglee 5 units Daily      Novolog Sensitive Correction Scale/ SSI (0-9 units) TID AC       Novolog 5 units TID with meals   Discharge Recommendations: Long acting recommendations: Insulin Glargine-yfgn (SEMGLEE) Pen 5 units Daily  Short acting recommendations:  Meal coverage ONLY Insulin aspart (NOVOLOG) FlexPen  6 units TID with meals (HOLD if pt doesn't eat, Give Half dose if unable to eat and CBG >250)   Supply/Referral recommendations: Pen needles - standard   Use Adult Diabetes Insulin Treatment Post Discharge order set.   --Will follow patient during hospitalization--  Ambrose Finland RN, MSN, CDCES Diabetes Coordinator Inpatient Glycemic Control Team Team Pager: [phone] (8a-5p)

## 2022-09-07 NOTE — Progress Notes (Signed)
Occupational Therapy Treatment Patient Details Name: Shawn [name] MRN: [id number] DOB: August 11, 1955 Today's Date: 09/07/2022   History of present illness Pt is a 67 y.o. male with medical history significant of HFrEF with EF of 20-25%, ESRD on hemodialysis MWF, HTN, DM, substance abuse, history of CVA presenting with sepsis, hypothermia, and hypoglycemia.  MD assessment includes: hypothermia, iron deficiency anemia, and hypoglycemia.   OT comments  Pt received in bed, face covered with sheets. Pt requests to perform grooming tasks - with encouragement, supine <> sitting EOB to wash face and don socks. Pt requires modA + use of trash can for LE support for socks. See flowsheet for additional details. Declines further mobility or exercises at this time, states he "is going home soon today". Demonstrates ability to scoot toward [facility] with cues. Continues to demo generalize weakness and impaired balance. OT will continue to follow to decrease level of caregiver burden, improve IND in self-care tasks and decrease the risk of falls.       If plan is discharge home, recommend the following:  A little help with walking and/or transfers;A little help with bathing/dressing/bathroom;Assistance with cooking/housework;Assist for transportation;Help with stairs or ramp for entrance   Equipment Recommendations  None recommended by OT       Precautions / Restrictions Precautions Precautions: Fall       Mobility Bed Mobility Overal bed mobility: Needs Assistance Bed Mobility: Supine to Sit, Sit to Supine Rolling: Contact guard assist   Supine to sit: Contact guard Sit to supine: Contact guard assist   General bed mobility comments: able to scoot towards [facility] reluctantly with cues    Transfers                   General transfer comment: pt refusing transfers or further mobility this date (insisting he is going home soon today)     Balance Overall balance assessment: Needs  assistance Sitting-balance support: Bilateral upper extremity supported, Feet unsupported Sitting balance-Leahy Scale: Good                                     ADL either performed or assessed with clinical judgement   ADL Overall ADL's : Needs assistance/impaired     Grooming: Wash/dry face;Wash/dry hands;Set up;Sitting               Lower Body Dressing: Moderate assistance Lower Body Dressing Details (indicate cue type and reason): Dons socks with encourgement to participate, does require modA / use of trash can for propping but also says sister does this at home               General ADL Comments: Pt performing ADL session, declining participation in any other exercises or OOB mobility.      Cognition Arousal: Alert Behavior During Therapy: WFL for tasks assessed/performed Overall Cognitive Status: Within Functional Limits for tasks assessed                                                     Pertinent Vitals/ Pain       Pain Assessment Pain Assessment: No/denies pain   Frequency  Min 1X/week        Progress Toward Goals  OT Goals(current goals can now be found in  the care plan section)  Progress towards OT goals: Progressing toward goals  Acute Rehab OT Goals Patient Stated Goal: to go home OT Goal Formulation: With patient Time For Goal Achievement: 09/18/22 Potential to Achieve Goals: Good  Plan         AM-PAC OT "6 Clicks" Daily Activity     Outcome Measure   Help from another person eating meals?: None Help from another person taking care of personal grooming?: A Little Help from another person toileting, which includes using toliet, bedpan, or urinal?: A Lot Help from another person bathing (including washing, rinsing, drying)?: A Lot Help from another person to put on and taking off regular upper body clothing?: A Little Help from another person to put on and taking off regular lower body clothing?:  A Lot 6 Click Score: 16    End of Session    OT Visit Diagnosis: Unsteadiness on feet (R26.81);History of falling (Z91.81);Muscle weakness (generalized) (M62.81)   Activity Tolerance Patient tolerated treatment well   Patient Left in bed;with call bell/phone within reach;with bed alarm set   Nurse Communication Other (comment) (NT entering room when OT exits)        Time: 1133-1145 OT Time Calculation (min): 12 min  Charges: OT General Charges $OT Visit: 1 Visit OT Treatments $Self Care/Home Management : 8-22 mins  Johanthan Kneeland L. Shavawn Stobaugh, OTR/L  09/07/22, 11:55 AM

## 2022-09-07 NOTE — Progress Notes (Signed)
Pt bladder scan showed . attempted strait I/O cath twice, only 25mL of ouput. MD notified and made aware.

## 2022-09-07 NOTE — Progress Notes (Signed)
Pt refuses to be bladder scanned, pt refusing to be strait cathed. Pt states we do not have to worry about his voiding and [facility]. Pt states he wants to go home and will have his sister pick him up. Pt education attempted but pt refuses. MD aware

## 2022-09-07 NOTE — TOC Progression Note (Addendum)
Transition of Care [facility]) - Progression Note    Patient Details  Name: Shawn [name] MRN: [id number] Date of Birth: 1955-08-26  Transition of Care [facility]) CM/SW Contact  Margarito Liner, LCSW Phone Number: 09/07/2022, 10:56 AM  Clinical Narrative:  CSW met with patient. He is agreeable to home health. He was discharged from [facility] in July and would like to start referral process with them. [facility] had authorized home health from April-July and would need a new authorization for services. CSW asked [facility] liaison to review referral.  12:33 pm: [facility] is on the front of his chart for MD to sign.  2:40 pm: [facility] is unable to accept patient back due to history of noncompliance with them. Liaison is checking to see if any other agencies can take him.  Expected Discharge Plan: Skilled Nursing Facility Barriers to Discharge: Continued Medical Work up  Expected Discharge Plan and Services       Living arrangements for the past 2 months: Single Family Home                                       Social Determinants of Health (SDOH) Interventions SDOH Screenings   Food Insecurity: No Food Insecurity (09/03/2022)  Housing: Low Risk  (09/03/2022)  Transportation Needs: No Transportation Needs (09/03/2022)  Utilities: Not At Risk (09/03/2022)  Financial Resource Strain: High Risk (08/24/2019)   Received from [facility], [facility]  Social Connections: Moderately Isolated (08/24/2019)   Received from [facility], [facility]  Stress: No Stress Concern Present (08/24/2019)   Received from [facility], [facility]  Tobacco Use: High Risk (09/03/2022)    Readmission Risk Interventions    09/04/2022   11:28 AM 06/24/2021    2:57 PM 01/08/2021    1:22 PM  Readmission Risk Prevention Plan  Transportation Screening Complete Complete  Complete  PCP or Specialist Appt within 3-5 Days Complete    HRI or Home Care Consult Complete    Social Work Consult for Recovery Care Planning/Counseling Complete  Complete  Palliative Care Screening Not Applicable  Not Applicable  Medication Review Oceanographer) Complete Complete Complete  Palliative Care Screening  Not Applicable   Skilled Nursing Facility  Not Applicable

## 2022-09-08 ENCOUNTER — Inpatient Hospital Stay: Payer: 59 | Admitting: Radiology

## 2022-09-08 ENCOUNTER — Inpatient Hospital Stay: Payer: 59

## 2022-09-08 DIAGNOSIS — A419 Sepsis, unspecified organism: Secondary | ICD-10-CM | POA: Diagnosis not present

## 2022-09-08 LAB — CULTURE, BLOOD (ROUTINE X 2)
Culture: NO GROWTH
Culture: NO GROWTH
Special Requests: ADEQUATE
Special Requests: ADEQUATE

## 2022-09-08 LAB — RENAL FUNCTION PANEL
Albumin: 2.3 g/dL — ABNORMAL LOW (ref 3.5–5.0)
Anion gap: 12 (ref 5–15)
BUN: 47 mg/dL — ABNORMAL HIGH (ref 8–23)
CO2: 26 mmol/L (ref 22–32)
Calcium: 8.1 mg/dL — ABNORMAL LOW (ref 8.9–10.3)
Chloride: 94 mmol/L — ABNORMAL LOW (ref 98–111)
Creatinine, Ser: 4.83 mg/dL — ABNORMAL HIGH (ref 0.61–1.24)
GFR, Estimated: 13 mL/min — ABNORMAL LOW (ref >60)
Glucose, Bld: 213 mg/dL — ABNORMAL HIGH (ref 70–99)
Phosphorus: 2.6 mg/dL (ref 2.5–4.6)
Potassium: 4.6 mmol/L (ref 3.5–5.1)
Sodium: 132 mmol/L — ABNORMAL LOW (ref 135–145)

## 2022-09-08 LAB — CBC
HCT: 28.7 % — ABNORMAL LOW (ref 39.0–52.0)
Hemoglobin: 9.4 g/dL — ABNORMAL LOW (ref 13.0–17.0)
MCH: 28.9 pg (ref 26.0–34.0)
MCHC: 32.8 g/dL (ref 30.0–36.0)
MCV: 88.3 fL (ref 80.0–100.0)
Platelets: 284 10*3/uL (ref 150–400)
RBC: 3.25 MIL/uL — ABNORMAL LOW (ref 4.22–5.81)
RDW: 15.9 % — ABNORMAL HIGH (ref 11.5–15.5)
WBC: 4.5 10*3/uL (ref 4.0–10.5)
nRBC: 0 % (ref 0.0–0.2)

## 2022-09-08 LAB — GLUCOSE, CAPILLARY
Glucose-Capillary: 78 mg/dL (ref 70–99)
Glucose-Capillary: 202 mg/dL — ABNORMAL HIGH (ref 70–99)
Glucose-Capillary: 122 mg/dL — ABNORMAL HIGH (ref 70–99)
Glucose-Capillary: 98 mg/dL (ref 70–99)

## 2022-09-08 MED ORDER — LIDOCAINE HCL 1 % IJ SOLN
INTRAMUSCULAR | Status: AC
  Filled 2022-09-08: qty 20

## 2022-09-08 MED ORDER — HEPARIN SODIUM (PORCINE) 1000 UNIT/ML IJ SOLN
INTRAMUSCULAR | Status: AC
  Filled 2022-09-08: qty 4

## 2022-09-08 NOTE — Progress Notes (Signed)
Physical Therapy Treatment Patient Details Name: Shawn [name] MRN: [id number] DOB: [birth year] Today's Date: 09/08/2022   History of Present Illness Pt is a 67 y.o. male with medical history significant of HFrEF with EF of 20-25%, ESRD on hemodialysis MWF, HTN, DM, substance abuse, history of CVA presenting with sepsis, hypothermia, and hypoglycemia.  MD assessment includes: hypothermia, iron deficiency anemia, and hypoglycemia.    PT Comments  Pt is slowly progressing towards PT goals but limited due to poor participation. Pt is received in bed, he is agreeable to PT session following some persuading and conversation about mobility benefits. Pt attempted to perform bed mobility 1x to sit EOB but unsuccessful due to reports of "feeling too tired". Pt able to perform bed exercises in chair position to focus on BLE strengthening but limited due to fatigue and poor participation. Educated Pt on benefits and importance behind walking as much as possible and changing positions to promote independence at home-Pt verbalized understanding but still did not want to participate. Pt would benefit from cont skilled PT to address above deficits and promote optimal return to PLOF.    If plan is discharge home, recommend the following: Two people to help with walking and/or transfers;A lot of help with bathing/dressing/bathroom;Assistance with cooking/housework;Direct supervision/assist for medications management;Assist for transportation;Help with stairs or ramp for entrance   Can travel by private vehicle     No  Equipment Recommendations  Other (comment) (TBD at next facility)    Recommendations for Other Services       Precautions / Restrictions Precautions Precautions: Fall Restrictions Weight Bearing Restrictions: No Other Position/Activity Restrictions: Bed bugs     Mobility  Bed Mobility Overal bed mobility: Needs Assistance       Supine to sit: Contact guard     General bed  mobility comments: Attempted 1x supine to sit but unable to complete task due to feeling tired    Transfers Overall transfer level: Needs assistance                 General transfer comment: pt refusing transfers or further mobility this date (insisting he is going home soon today)    Ambulation/Gait               General Gait Details: Unable to perform due to Pt's limited participation   Stairs             Wheelchair Mobility     Tilt Bed    Modified Rankin (Stroke Patients Only)       Balance Overall balance assessment: Needs assistance   Sitting balance-Leahy Scale: Fair Sitting balance - Comments: Able to sustain seated balance in long sitting but not for a long time.       Standing balance comment: Unable to assess due to Pt's limited participation                            Cognition Arousal: Alert Behavior During Therapy: [facility] for tasks assessed/performed Overall Cognitive Status: Within Functional Limits for tasks assessed                                 General Comments: Cooperative with session following conversation about benefits        Exercises Other Exercises Other Exercises: B LE bed exercises: x10 seated marches, x10 heel slides, x10 hip ABD/ADD    General Comments  Pertinent Vitals/Pain Pain Assessment Pain Assessment: No/denies pain    Home Living                          Prior Function            PT Goals (current goals can now be found in the care plan section) Acute Rehab PT Goals Patient Stated Goal: To get stronger and return home PT Goal Formulation: With patient Time For Goal Achievement: 09/16/22 Potential to Achieve Goals: Fair Progress towards PT goals: Progressing toward goals    Frequency    Min 1X/week      PT Plan      Co-evaluation              AM-PAC PT "6 Clicks" Mobility   Outcome Measure  Help needed turning from your back to  your side while in a flat bed without using bedrails?: A Lot Help needed moving from lying on your back to sitting on the side of a flat bed without using bedrails?: A Lot Help needed moving to and from a bed to a chair (including a wheelchair)?: A Lot Help needed standing up from a chair using your arms (e.g., wheelchair or bedside chair)?: A Lot Help needed to walk in hospital room?: Total Help needed climbing 3-5 steps with a railing? : Total 6 Click Score: 10    End of Session   Activity Tolerance: Patient limited by lethargy Patient left: in bed;with call bell/phone within reach;Other (comment) Nurse Communication: Mobility status PT Visit Diagnosis: Difficulty in walking, not elsewhere classified (R26.2);Muscle weakness (generalized) (M62.81)     Time: 1884-1660 PT Time Calculation (min) (ACUTE ONLY): 10 min  Charges:                            Elmon Else, SPT    Kaileb Monsanto 09/08/2022, 9:47 AM

## 2022-09-08 NOTE — Progress Notes (Signed)
[facility]  ROUNDING NOTE   Subjective:   [name] was admitted to [facility] on 09/03/2022 for Sinus bradycardia [R00.1] Hypoglycemia [E16.2] Acute UTI [N39.0] Hypothermia, initial encounter [T68.XXXA] Sepsis [facility]) [A41.9]  Patient is known to our practice and receives dialysis at [facility] on a MWF schedule, He was recently admitted for hyperkalemia and discharge on 08/31/22. He presented again for hypoglycemia and found to have sepsis.   Patient seen resting in bed   HEMODIALYSIS FLOWSHEET:  Blood Flow Rate (mL/min): 400 mL/min Arterial Pressure (mmHg): -140 mmHg Venous Pressure (mmHg): 140 mmHg TMP (mmHg): 2 mmHg Ultrafiltration Rate (mL/min): 0 mL/min Dialysate Flow Rate (mL/min): 300 ml/min  Tolerating treatment well   Objective:  Vital signs in last 24 hours:  Temp:  [97.2 F (36.2 C)-98.2 F (36.8 C)] 97.9 F (36.6 C) (09/04 1209) Pulse Rate:  [60-90] 70 (09/04 1445) Resp:  [12-18] 16 (09/04 1445) BP: (78-139)/(43-90) 91/54 (09/04 1445) SpO2:  [91 %-100 %] 99 % (09/04 1445)  Weight change:  Filed Weights   09/03/22 0417  Weight: 70.4 kg    Intake/Output: I/O last 3 completed shifts: In: -  Out: 25 [Urine:25]   Intake/Output this shift:  No intake/output data recorded.  Physical Exam: General: NAD  Head: Normocephalic, atraumatic. Moist oral mucosal membranes  Eyes: Anicteric  Lungs:  Clear to auscultation  Heart: Regular rate and rhythm  Abdomen:  Soft, nontender, distended  Extremities: 1-2+ left > right peripheral edema.  Neurologic: Alert, moving all four extremities  Skin: No lesions  Access: RIJ permcath    Basic Metabolic Panel: Recent Labs  Lab 09/03/22 0422 09/04/22 0527 09/05/22 0504 09/06/22 0330 09/08/22 1222  NA 134* 129* 130* 132* 132*  K 4.3 4.8 4.8 4.1 4.6  CL 92* 95* 95* 95* 94*  CO2 24 22 21* 24 26  GLUCOSE 114* 197* 146* 148* 213*  BUN 57* 69* 76* 52* 47*  CREATININE 6.45* 6.78*  7.48* 5.16* 4.83*  CALCIUM 8.9 8.1* 8.1* 7.9* 8.1*  MG 2.4  --  2.4 2.1  --   PHOS  --   --   --   --  2.6    Liver Function Tests: Recent Labs  Lab 09/04/22 0527 09/08/22 1222  AST 12*  --   ALT 7  --   ALKPHOS 67  --   BILITOT 0.3  --   PROT 5.8*  --   ALBUMIN 2.7* 2.3*   No results for input(s): "LIPASE", "AMYLASE" in the last 168 hours. No results for input(s): "AMMONIA" in the last 168 hours.  CBC: Recent Labs  Lab 09/03/22 0422 09/04/22 0527 09/05/22 0504 09/06/22 0330 09/08/22 1222  WBC 2.8* 3.7* 4.0 3.6* 4.5  NEUTROABS 2.1  --   --   --   --   HGB 9.9* 9.4* 8.8* 9.2* 9.4*  HCT 29.7* 28.4* 26.2* 28.8* 28.7*  MCV 87.1 86.9 86.2 91.1 88.3  PLT 170 197 229 208 284    Cardiac Enzymes: Recent Labs  Lab 09/03/22 0953  CKTOTAL 89    BNP: Invalid input(s): "POCBNP"  CBG: Recent Labs  Lab 09/07/22 1145 09/07/22 1700 09/07/22 2133 09/08/22 0751 09/08/22 1214  GLUCAP 200* 134* 80 78 202*    Microbiology: Results for orders placed or performed during the hospital encounter of 09/03/22  Blood culture (routine x 2)     Status: None   Collection Time: 09/03/22  5:33 AM   Specimen: BLOOD LEFT ARM  Result Value  Ref Range Status   Specimen Description BLOOD LEFT ARM  Final   Special Requests   Final    BOTTLES DRAWN AEROBIC AND ANAEROBIC Blood Culture adequate volume   Culture   Final    NO GROWTH 5 DAYS Performed at [facility], [address] [place], [place] [address]    Report Status 09/08/2022 FINAL  Final  SARS Coronavirus 2 by RT PCR (hospital order, performed in [facility] hospital lab) *cepheid single result test* Anterior Nasal Swab     Status: None   Collection Time: 09/03/22  5:33 AM   Specimen: Anterior Nasal Swab  Result Value Ref Range Status   SARS Coronavirus 2 by RT PCR NEGATIVE NEGATIVE Final    Comment: (NOTE) SARS-CoV-2 target nucleic acids are NOT DETECTED.  The SARS-CoV-2 RNA is generally detectable in upper and  lower respiratory specimens during the acute phase of infection. The lowest concentration of SARS-CoV-2 viral copies this assay can detect is 250 copies / mL. A negative result does not preclude SARS-CoV-2 infection and should not be used as the sole basis for treatment or other patient management decisions.  A negative result may occur with improper specimen collection / handling, submission of specimen other than nasopharyngeal swab, presence of viral mutation(s) within the areas targeted by this assay, and inadequate number of viral copies (<250 copies / mL). A negative result must be combined with clinical observations, patient history, and epidemiological information.  Fact Sheet for Patients:   RoadLapTop.co.za  Fact Sheet for Healthcare Providers: http://kim-miller.com/  This test is not yet approved or  cleared by the [place] FDA and has been authorized for detection and/or diagnosis of SARS-CoV-2 by FDA under an Emergency Use Authorization (EUA).  This EUA will remain in effect (meaning this test can be used) for the duration of the COVID-19 declaration under Section 564(b)(1) of the Act, 21 U.S.C. section 360bbb-3(b)(1), unless the authorization is terminated or revoked sooner.  Performed at [facility], [address] [place], [place] [address]   Urine Culture     Status: None   Collection Time: 09/03/22  5:43 AM   Specimen: Urine, Clean Catch  Result Value Ref Range Status   Specimen Description   Final    URINE, CLEAN CATCH Performed at [facility], [address], [place], [place] [address]    Special Requests   Final    NONE Performed at [facility], [address] [place], [place] [address]    Culture   Final    NO GROWTH Performed at [facility], [address], [place], [place] [address]    Report Status 09/04/2022 FINAL  Final  Blood culture (routine x 2)      Status: None   Collection Time: 09/03/22  6:30 AM   Specimen: BLOOD  Result Value Ref Range Status   Specimen Description BLOOD BLOOD RIGHT ARM  Final   Special Requests   Final    BOTTLES DRAWN AEROBIC AND ANAEROBIC Blood Culture adequate volume   Culture   Final    NO GROWTH 5 DAYS Performed at [facility], [address], [place], [place] [address]    Report Status 09/08/2022 FINAL  Final    Coagulation Studies: No results for input(s): "LABPROT", "INR" in the last 72 hours.   Urinalysis: No results for input(s): "COLORURINE", "LABSPEC", "PHURINE", "GLUCOSEU", "HGBUR", "BILIRUBINUR", "KETONESUR", "PROTEINUR", "UROBILINOGEN", "NITRITE", "LEUKOCYTESUR" in the last 72 hours.  Invalid input(s): "APPERANCEUR"  Imaging: No results found.   Medications:    sodium chloride      aspirin EC  81 mg Oral Daily   atorvastatin  40 mg Oral QHS   Chlorhexidine Gluconate Cloth  6 each Topical Q0600   epoetin (EPOGEN/PROCRIT) injection  4,000 Units Intravenous Q M,W,F-HD   heparin  5,000 Units Subcutaneous Q8H   insulin aspart  0-9 Units Subcutaneous TID WC   insulin aspart  6 Units Subcutaneous TID WC   insulin glargine-yfgn  5 Units Subcutaneous Q1200   levothyroxine  75 mcg Oral QAC breakfast   nicotine  7 mg Transdermal Daily   pantoprazole  40 mg Oral Daily   sodium chloride flush  3 mL Intravenous Q12H   tamsulosin  0.4 mg Oral QPC supper   sodium chloride, acetaminophen, alteplase, dextrose, heparin, mouth rinse, sodium chloride flush  Assessment/ Plan:  [name] is a 67 y.o.  male with end stage renal disease on hemodialysis, hypertension, diabetes mellitus type II, congestive heart failure, coronary artery disease, CVA, alcohol abuse, tobacco abuse and hypothyroidism who presents to Sinus bradycardia [R00.1] Hypoglycemia [E16.2] Acute UTI [N39.0] Hypothermia, initial encounter [T68.XXXA] Sepsis ([name]) [A41.9]   [facility] MWF [facility] RIJ permcath 64kg  End Stage Renal Disease with LE edema. - Receiving dialysis at bedside due to isolation status.  -Next treatment scheduled for Friday   Diabetes mellitus type II with chronic kidney disease:  -Hypoglycemia with EMS.  - Hgb A1c 11 % on 08/29/22  -Managed with semglee, NovoLog   Anemia with chronic kidney disease: macrocytic - Low dose ESA with HD treatments.  - Hgb 9.4 - Remains on Mircera at outpatient clinic   4. Secondary Hyperparathyroidism: with outpatient labs: none available.   Lab Results  Component Value Date   CALCIUM 8.1 (L) 09/08/2022   PHOS 2.6 09/08/2022    Calcium and phosphorus at desired range   LOS: 5 Kyleeann Cremeans 9/4/20242:51 PM

## 2022-09-08 NOTE — Discharge Planning (Signed)
ESTABLISHED HEMODIALYSIS Outpatient Facility  [facility]  [address] [place], [place] [address] [phone]  Schedule: MWF 11:20am  Confirmed schedule with Sheralyn Boatman, CNM. Patient is active and can resume schedule upon discharge.   Dimas Chyle Dialysis Coordinator II  [facility] Cell: [phone] eFax: [phone] [name]

## 2022-09-08 NOTE — Plan of Care (Signed)
Plan of Care Plan for patient to undergo paracentesis tomorrow as patient was undergoing dialysis during IR availability today. Kennieth Francois, PA-C 09/08/2022

## 2022-09-08 NOTE — TOC Progression Note (Addendum)
Transition of Care [facility]) - Progression Note    Patient Details  Name: Shawn [name] MRN: [id number] Date of Birth: Jan 22, 1955  Transition of Care [facility]) CM/SW Contact  Chapman Fitch, RN Phone Number: 09/08/2022, 10:55 AM  Clinical Narrative:     Confirmed with [name] at [facility] health that patient was accepted for home health services She request that Home health orders be secure emailed to [email or web address]  Updated [facility] auth forms placed on chart for MD signature   Update:  completed [facility] auth form secure emailed to [place] at [facility]  Received call from sister stating that patients PCP is requesting hospice services. MD notified. MD states that plan will be to continue with home health and if hospice referral indicated to follow up with PCP.  MD to update sister  Expected Discharge Plan: Skilled Nursing Facility Barriers to Discharge: Continued Medical Work up  Expected Discharge Plan and Services       Living arrangements for the past 2 months: Single Family Home                                       Social Determinants of Health (SDOH) Interventions SDOH Screenings   Food Insecurity: No Food Insecurity (09/03/2022)  Housing: Low Risk  (09/03/2022)  Transportation Needs: No Transportation Needs (09/03/2022)  Utilities: Not At Risk (09/03/2022)  Financial Resource Strain: High Risk (08/24/2019)   Received from [facility], [facility]  Social Connections: Moderately Isolated (08/24/2019)   Received from [facility], [facility]  Stress: No Stress Concern Present (08/24/2019)   Received from [facility], [facility]  Tobacco Use: High Risk (09/03/2022)    Readmission Risk Interventions    09/04/2022   11:28 AM 06/24/2021    2:57 PM 01/08/2021    1:22 PM  Readmission Risk Prevention Plan  Transportation Screening Complete Complete Complete  PCP or  Specialist Appt within 3-5 Days Complete    HRI or Home Care Consult Complete    Social Work Consult for Recovery Care Planning/Counseling Complete  Complete  Palliative Care Screening Not Applicable  Not Applicable  Medication Review Oceanographer) Complete Complete Complete  Palliative Care Screening  Not Applicable   Skilled Nursing Facility  Not Applicable

## 2022-09-08 NOTE — Progress Notes (Signed)
OT Cancellation Note  Patient Details Name: Shawn [name] MRN: [id number] DOB: [birth year]   Cancelled Treatment:    Reason Eval/Treat Not Completed: Other (comment) (Staff in room, pt recieiving dialysis. OT will re-attempt as able.)  [name], OTR/L  09/08/22, 4:15 PM  [name] 09/08/2022, 4:15 PM

## 2022-09-08 NOTE — Progress Notes (Signed)
PROGRESS NOTE    Shawn [name]   Shawn [name]:[id number] DOB: [birth year]  DOA: 09/03/2022 Date of Service: 09/08/22 [facility]CP: [facility]     Brief Narrative / Hospital Course:  Shawn [name] is a 67 y.o. male with medical history significant of HFpEF, ESRD on hemodialysis Monday Wednesday Friday, hypertension, type 2 diabetes, substance abuse, history of CVA presenting with severe chills with noted blood sugar in the 20s. Noted admission August 24 through August 27 for issues including DKA associated with missed insulin dosing, hyperkalemia as well as anasarca.  08/30 to ED and admitted to hospitalist service w/ sepsis concern for UTI, hypothermia, hypoglycemia. Glc stabilized over next few days. Sepsis eventually r/o. PT/OT recs for SNF but pt declined this in favor of going home, TOC arranging HH. Paracentesis pending   Consultants:  Nephrology   Procedures: Pending paracentesis       ASSESSMENT & PLAN:   Principal Problem:   Sepsis ([name]) Active Problems:   Hypothermia   Hypoglycemia   Essential hypertension   Iron deficiency anemia   Smoker   HLD (hyperlipidemia)   ESRD on dialysis ([name])   HFrEF (heart failure with reduced ejection fraction) ([name])   Hypoglycemia DM2 with Hypo/Hyperglycemia W/ ESRD, pt would be more sensitive to insulin.   However, DM poorly controlled, A1c 11.0. cont home glargine 5u daily increase mealtime to 6u TID ACHS and SSI Stressed to pt that he should only take short-acting insulin when he eats a meal  Hypothermia, resolved Tmax 90-92, improved with bear hugger Suspect likely due to hypoglycemia Monitor temp and Glc   Essential hypertension  on home torsemide on non-dialysis days   Hx of Iron deficiency anemia Anemia with chronic kidney disease Baseline hgb 8-10  ESA with MWF scheduled HD treatments.    Smoker 1/4 pack/day smoker Discussed cessation with admitting physician Nicotine patch   HLD  (hyperlipidemia) Cont statin    HFrEF (heart failure with reduced ejection fraction) ([name]) 2D echo June 2023 with EF of 20 to 25% Baseline volume status mediated by predominantly hemodialysis    ESRD on dialysis ([name]) MWF HD per nephrology   Abdominal distention initially thought to be due to urinary retention, however, I/O cath with only ~20 ml urine obtained. pt had CT a/p on 08/28/22 from last hospitalization that showed large ascites. US paracentesis ordered and still pending     Sepsis ([name]), ruled out no source of infection.  Urine cx neg growth. d/c'ed abx     DVT prophylaxis: heparin - he has been refusing  IV fluids: no continuous IV fluids  Nutrition: cardiac/diabetic diet  Central lines / invasive devices: HD cath R IJ   Code Status: FULL CODE  ACP documentation reviewed: 09/08/22 none on file in [facility]  Current Admission Status: inpatient   LOS: 5 TOC needs: HH  Barriers to discharge / significant pending items: paracentesis             Subjective / Brief ROS:  Patient reports feels fine today  Denies CP/SOB.  Pain controlled.  Denies new weakness.  Tolerating diet.  Reports no concerns w/ urination/defecation.   Family Communication: none at this time     Objective Findings:  Vitals:   09/08/22 1545 09/08/22 1556 09/08/22 1616 09/08/22 1708  BP: (!) 77/44 (!) 87/59 107/61 123/86  Pulse: 67 68 71 73  Resp: 15 16 17 16   Temp:    98.2 F (36.8 C)  TempSrc:    Oral  SpO2: 98% 99% 99% 98%  Weight:      Height:        Intake/Output Summary (Last 24 hours) at 09/08/2022 1719 Last data filed at 09/08/2022 1616 Gross per 24 hour  Intake --  Output 800 ml  Net -800 ml   Filed Weights   09/03/22 0417  Weight: 70.4 kg    Examination:  Physical Exam Constitutional:      General: He is not in acute distress. Cardiovascular:     Rate and Rhythm: Normal rate and regular rhythm.  Pulmonary:     Effort: Pulmonary effort is normal.      Breath sounds: Normal breath sounds.  Abdominal:     General: There is distension.     Tenderness: There is no abdominal tenderness.  Musculoskeletal:     Right lower leg: No edema.     Left lower leg: No edema.  Neurological:     General: No focal deficit present.     Mental Status: He is alert. Mental status is at baseline.  Psychiatric:        Mood and Affect: Mood normal.          Scheduled Medications:   aspirin EC  81 mg Oral Daily   atorvastatin  40 mg Oral QHS   Chlorhexidine Gluconate Cloth  6 each Topical Q0600   epoetin (EPOGEN/PROCRIT) injection  4,000 Units Intravenous Q M,W,F-HD   heparin  5,000 Units Subcutaneous Q8H   insulin aspart  0-9 Units Subcutaneous TID WC   insulin aspart  6 Units Subcutaneous TID WC   insulin glargine-yfgn  5 Units Subcutaneous Q1200   levothyroxine  75 mcg Oral QAC breakfast   nicotine  7 mg Transdermal Daily   pantoprazole  40 mg Oral Daily   sodium chloride flush  3 mL Intravenous Q12H   tamsulosin  0.4 mg Oral QPC supper    Continuous Infusions:  sodium chloride      PRN Medications:  sodium chloride, acetaminophen, alteplase, dextrose, heparin, mouth rinse, sodium chloride flush  Antimicrobials from admission:  Anti-infectives (From admission, onward)    Start     Dose/Rate Route Frequency Ordered Stop   09/04/22 0800  ceFEPIme (MAXIPIME) 1 g in sodium chloride 0.9 % 100 mL IVPB  Status:  Discontinued        1 g 200 mL/hr over 30 Minutes Intravenous Every 24 hours 09/03/22 0901 09/04/22 1842   09/03/22 0630  ceFEPIme (MAXIPIME) 2 g in sodium chloride 0.9 % 100 mL IVPB        2 g 200 mL/hr over 30 Minutes Intravenous  Once 09/03/22 0616 09/03/22 0813   09/03/22 0630  vancomycin (VANCOREADY) IVPB 1500 mg/300 mL        1,500 mg 150 mL/hr over 120 Minutes Intravenous  Once 09/03/22 6010 09/03/22 0957           Data Reviewed:  I have personally reviewed the following...  CBC: Recent Labs  Lab 09/03/22 0422  09/04/22 0527 09/05/22 0504 09/06/22 0330 09/08/22 1222  WBC 2.8* 3.7* 4.0 3.6* 4.5  NEUTROABS 2.1  --   --   --   --   HGB 9.9* 9.4* 8.8* 9.2* 9.4*  HCT 29.7* 28.4* 26.2* 28.8* 28.7*  MCV 87.1 86.9 86.2 91.1 88.3  PLT 170 197 229 208 284   Basic Metabolic Panel: Recent Labs  Lab 09/03/22 0422 09/04/22 0527 09/05/22 0504 09/06/22 0330 09/08/22 1222  NA 134* 129* 130* 132* 132*  K  4.3 4.8 4.8 4.1 4.6  CL 92* 95* 95* 95* 94*  CO2 24 22 21* 24 26  GLUCOSE 114* 197* 146* 148* 213*  BUN 57* 69* 76* 52* 47*  CREATININE 6.45* 6.78* 7.48* 5.16* 4.83*  CALCIUM 8.9 8.1* 8.1* 7.9* 8.1*  MG 2.4  --  2.4 2.1  --   PHOS  --   --   --   --  2.6   GFR: Estimated Creatinine Clearance: 15 mL/min (A) (by C-G formula based on SCr of 4.83 mg/dL (H)). Liver Function Tests: Recent Labs  Lab 09/04/22 0527 09/08/22 1222  AST 12*  --   ALT 7  --   ALKPHOS 67  --   BILITOT 0.3  --   PROT 5.8*  --   ALBUMIN 2.7* 2.3*   No results for input(s): "LIPASE", "AMYLASE" in the last 168 hours. No results for input(s): "AMMONIA" in the last 168 hours. Coagulation Profile: No results for input(s): "INR", "PROTIME" in the last 168 hours. Cardiac Enzymes: Recent Labs  Lab 09/03/22 0953  CKTOTAL 89   BNP (last 3 results) No results for input(s): "PROBNP" in the last 8760 hours. HbA1C: No results for input(s): "HGBA1C" in the last 72 hours. CBG: Recent Labs  Lab 09/07/22 1145 09/07/22 1700 09/07/22 2133 09/08/22 0751 09/08/22 1214  GLUCAP 200* 134* 80 78 202*   Lipid Profile: No results for input(s): "CHOL", "HDL", "LDLCALC", "TRIG", "CHOLHDL", "LDLDIRECT" in the last 72 hours. Thyroid Function Tests: No results for input(s): "TSH", "T4TOTAL", "FREET4", "T3FREE", "THYROIDAB" in the last 72 hours. Anemia Panel: No results for input(s): "VITAMINB12", "FOLATE", "FERRITIN", "TIBC", "IRON", "RETICCTPCT" in the last 72 hours. Most Recent Urinalysis On File:     Component Value Date/Time    COLORURINE YELLOW (A) 09/03/2022 0543   APPEARANCEUR CLOUDY (A) 09/03/2022 0543   LABSPEC 1.019 09/03/2022 0543   PHURINE 5.0 09/03/2022 0543   GLUCOSEU 150 (A) 09/03/2022 0543   HGBUR LARGE (A) 09/03/2022 0543   BILIRUBINUR NEGATIVE 09/03/2022 0543   KETONESUR NEGATIVE 09/03/2022 0543   PROTEINUR >=300 (A) 09/03/2022 0543   NITRITE NEGATIVE 09/03/2022 0543   LEUKOCYTESUR MODERATE (A) 09/03/2022 0543   Sepsis Labs: @LABRCNTIP (procalcitonin:4,lacticidven:4) Microbiology: Recent Results (from the past 240 hour(s))  Blood culture (routine x 2)     Status: None   Collection Time: 09/03/22  5:33 AM   Specimen: BLOOD LEFT ARM  Result Value Ref Range Status   Specimen Description BLOOD LEFT ARM  Final   Special Requests   Final    BOTTLES DRAWN AEROBIC AND ANAEROBIC Blood Culture adequate volume   Culture   Final    NO GROWTH 5 DAYS Performed at [facility], [address] [place], [place] [address]    Report Status 09/08/2022 FINAL  Final  SARS Coronavirus 2 by RT PCR (hospital order, performed in [facility] hospital lab) *cepheid single result test* Anterior Nasal Swab     Status: None   Collection Time: 09/03/22  5:33 AM   Specimen: Anterior Nasal Swab  Result Value Ref Range Status   SARS Coronavirus 2 by RT PCR NEGATIVE NEGATIVE Final    Comment: (NOTE) SARS-CoV-2 target nucleic acids are NOT DETECTED.  The SARS-CoV-2 RNA is generally detectable in upper and lower respiratory specimens during the acute phase of infection. The lowest concentration of SARS-CoV-2 viral copies this assay can detect is 250 copies / mL. A negative result does not preclude SARS-CoV-2 infection and should not be used as the sole  basis for treatment or other patient management decisions.  A negative result may occur with improper specimen collection / handling, submission of specimen other than nasopharyngeal swab, presence of viral mutation(s) within the areas targeted by this assay,  and inadequate number of viral copies (<250 copies / mL). A negative result must be combined with clinical observations, patient history, and epidemiological information.  Fact Sheet for Patients:   RoadLapTop.co.za  Fact Sheet for Healthcare Providers: http://kim-miller.com/  This test is not yet approved or  cleared by the [place] FDA and has been authorized for detection and/or diagnosis of SARS-CoV-2 by FDA under an Emergency Use Authorization (EUA).  This EUA will remain in effect (meaning this test can be used) for the duration of the COVID-19 declaration under Section 564(b)(1) of the Act, 21 U.S.C. section 360bbb-3(b)(1), unless the authorization is terminated or revoked sooner.  Performed at [facility], [address], [place], [place] [address]   Urine Culture     Status: None   Collection Time: 09/03/22  5:43 AM   Specimen: Urine, Clean Catch  Result Value Ref Range Status   Specimen Description   Final    URINE, CLEAN CATCH Performed at [facility], [address], [place], [place] [address]    Special Requests   Final    NONE Performed at [facility], [address] [place], [place] [address]    Culture   Final    NO GROWTH Performed at [facility], [address], [place], [place] [address]    Report Status 09/04/2022 FINAL  Final  Blood culture (routine x 2)     Status: None   Collection Time: 09/03/22  6:30 AM   Specimen: BLOOD  Result Value Ref Range Status   Specimen Description BLOOD BLOOD RIGHT ARM  Final   Special Requests   Final    BOTTLES DRAWN AEROBIC AND ANAEROBIC Blood Culture adequate volume   Culture   Final    NO GROWTH 5 DAYS Performed at [facility], [address] [place], [place] [address]    Report Status 09/08/2022 FINAL  Final      Radiology Studies last 3 days: No results found.           LOS: 5 days      Sunnie Nielsen, DO [facility] 09/08/2022, 5:19 PM    Dictation software may have been used to generate the above note. Typos may occur and escape review in typed/dictated notes. Please contact Dr Lyn Hollingshead directly for clarity if needed.  Staff may message me via secure chat in Epic  but this may not receive an immediate response,  please page me for urgent matters!  If 7PM-7AM, please contact night coverage [email or web address]

## 2022-09-08 NOTE — Inpatient Diabetes Management (Signed)
Inpatient Diabetes Program Recommendations  AACE/ADA: New Consensus Statement on Inpatient Glycemic Control (2015)  Target Ranges:  Prepandial:   less than 140 mg/dL      Peak postprandial:   less than 180 mg/dL (1-2 hours)      Critically ill patients:  140 - 180 mg/dL    Latest Reference Range & Units 09/07/22 09:07 09/07/22 11:45 09/07/22 17:00 09/07/22 21:33  Glucose-Capillary 70 - 99 mg/dL 474 (H) 259 (H) 563 (H) 80  (H): Data is abnormally high  Latest Reference Range & Units 09/08/22 07:51  Glucose-Capillary 70 - 99 mg/dL 78   Admit with: Hypoglycemia Pt states his sugar was 135 before bed and took 3 units of insulin. Pt states he woke up not feeling well and called 911. Ems states pts blood sugar 26 on the truck    History: DM, ESRD   Home DM Meds: Semglee 5 units Daily   Current Orders: Semglee 5 units Daily                            Novolog Sensitive Correction Scale/ SSI (0-9 units) TID AC                             Novolog 6 units TID with meals     Spoke w/ pt at bedside this afternoon regarding potential discharge insulin plans.  Reviewed with pt that Semglee (glargine) insulin is to be given once daily at the same time every day.  Reminded pt that the Novolog insulin should be given with food intake and that if he doesn't eat he should not take the set dose Novolog.  Pt stated understanding, however, I am unsure at what level pt is truly able to take care of himself.  Going home with sister.  HHRN to also come to house.  Asked pt where he injects insulin and pt told me stomach, back of arm, and top of thigh.  Pt verbalized rotation of sites.  Discharge Recommendations: Long acting recommendations: Insulin Glargine-yfgn (SEMGLEE) Pen 5 units Daily  Short acting recommendations:  Meal coverage ONLY Insulin aspart (NOVOLOG) FlexPen  6 units TID with meals (HOLD if pt doesn't eat, Give Half dose if unable to eat and CBG >250)   Supply/Referral recommendations: Pen  needles - standard   Use Adult Diabetes Insulin Treatment Post Discharge order set.     --Will follow patient during hospitalization--  Ambrose Finland RN, MSN, CDCES Diabetes Coordinator Inpatient Glycemic Control Team Team Pager: [phone] (8a-5p)

## 2022-09-08 NOTE — Procedures (Signed)
HD Note:  Some information was entered later than the data was gathered due to patient care needs. The stated time with the data is accurate.  Patient treatment done at bedside  Alert and oriented.   Informed consent signed and in chart.   Access used: right upper chest HD catheter Access issues: None  Patient BP decreased mid way during treatment.  UF was paused until the BP recovered.  See flowsheet  TX duration: 3.5 hours  Alert, without acute distress.  Total UF removed: 800 ml  Hand-off given to patient's nurse.    Joushua Dugar L. Dareen Piano, RN Kidney Dialysis Unit.

## 2022-09-08 NOTE — Hospital Course (Addendum)
Shawn [name] is a 67 y.o. male with medical history significant of HFpEF, ESRD on hemodialysis Monday Wednesday Friday, hypertension, type 2 diabetes, substance abuse, history of CVA presenting with severe chills with noted blood sugar in the 20s. Noted admission August 24 through August 27 for issues including DKA associated with missed insulin dosing, hyperkalemia as well as anasarca.  08/30 to ED and admitted to hospitalist service w/ sepsis concern for UTI, hypothermia, hypoglycemia. Glc stabilized over next few days. Sepsis eventually r/o. PT/OT recs for SNF but pt declined this in favor of going home, TOC arranging HH. Paracentesis pending   Consultants:  Nephrology   Procedures: Pending paracentesis       ASSESSMENT & PLAN:   Principal Problem:   Sepsis ([name]) Active Problems:   Hypothermia   Hypoglycemia   Essential hypertension   Iron deficiency anemia   Smoker   HLD (hyperlipidemia)   ESRD on dialysis ([name])   HFrEF (heart failure with reduced ejection fraction) ([name])   Hypoglycemia DM2 with Hypo/Hyperglycemia W/ ESRD, pt would be more sensitive to insulin.   However, DM poorly controlled, A1c 11.0. cont home glargine 5u daily increase mealtime to 6u TID ACHS and SSI Stressed to pt that he should only take short-acting insulin when he eats a meal  Hypothermia, resolved Tmax 90-92, improved with bear hugger Suspect likely due to hypoglycemia Monitor temp and Glc   Essential hypertension  on home torsemide on non-dialysis days   Hx of Iron deficiency anemia Anemia with chronic kidney disease Baseline hgb 8-10  ESA with MWF scheduled HD treatments.    Smoker 1/4 pack/day smoker Discussed cessation with admitting physician Nicotine patch   HLD (hyperlipidemia) Cont statin    HFrEF (heart failure with reduced ejection fraction) ([name]) 2D echo June 2023 with EF of 20 to 25% Baseline volume status mediated by predominantly hemodialysis    ESRD on  dialysis ([name]) MWF HD per nephrology   Abdominal distention initially thought to be due to urinary retention, however, I/O cath with only ~20 ml urine obtained. pt had CT a/p on 08/28/22 from last hospitalization that showed large ascites. US paracentesis ordered and still pending     Sepsis ([name]), ruled out no source of infection.  Urine cx neg growth. d/c'ed abx     DVT prophylaxis: heparin - he has been refusing  IV fluids: no continuous IV fluids  Nutrition: cardiac/diabetic diet  Central lines / invasive devices: HD cath R IJ   Code Status: FULL CODE  ACP documentation reviewed: 09/08/22 none [facility] in [facility]  Current Admission Status: inpatient   LOS: 5 TOC needs: HH  Barriers to discharge / significant pending items: paracentesis

## 2022-09-09 ENCOUNTER — Inpatient Hospital Stay: Payer: 59 | Admitting: Radiology

## 2022-09-09 DIAGNOSIS — T68XXXA Hypothermia, initial encounter: Secondary | ICD-10-CM | POA: Diagnosis not present

## 2022-09-09 HISTORY — PX: IR PARACENTESIS: IMG2679

## 2022-09-09 LAB — BODY FLUID CELL COUNT WITH DIFFERENTIAL
Eos, Fluid: 0 %
Lymphs, Fluid: 13 %
Monocyte-Macrophage-Serous Fluid: 85 %
Neutrophil Count, Fluid: 2 %
Total Nucleated Cell Count, Fluid: 104 uL

## 2022-09-09 LAB — PATHOLOGIST SMEAR REVIEW

## 2022-09-09 LAB — GLUCOSE, CAPILLARY
Glucose-Capillary: 155 mg/dL — ABNORMAL HIGH (ref 70–99)
Glucose-Capillary: 217 mg/dL — ABNORMAL HIGH (ref 70–99)

## 2022-09-09 MED ORDER — TAMSULOSIN HCL 0.4 MG PO CAPS: 0.4000 mg | ORAL_CAPSULE | Freq: Every day | ORAL | 0 refills | Status: DC

## 2022-09-09 MED ORDER — LIDOCAINE HCL 1 % IJ SOLN
10.0000 mL | Freq: Once | INTRAMUSCULAR | Status: AC
  Administered 2022-09-09: 10 mL via INTRADERMAL
  Filled 2022-09-09: qty 10

## 2022-09-09 NOTE — Care Management Important Message (Signed)
Important Message  Patient Details  Name: Shawn [name] MRN: [id number] Date of Birth: 24-Mar-1955   Medicare Important Message Given:  Yes  Reviewed Medicare IM with patient via room phone [phone]).  Copy of Medicare IM placed in mail to home address on file.   Johnell Comings 09/09/2022, 12:19 PM

## 2022-09-09 NOTE — Plan of Care (Signed)
  Problem: Fluid Volume: Goal: Hemodynamic stability will improve Outcome: Completed/Met   Problem: Clinical Measurements: Goal: Diagnostic test results will improve Outcome: Completed/Met Goal: Signs and symptoms of infection will decrease Outcome: Completed/Met   Problem: Respiratory: Goal: Ability to maintain adequate ventilation will improve Outcome: Completed/Met   Problem: Acute Rehab PT Goals(only PT should resolve) Goal: Pt Will Go Sit To Supine/Side Outcome: Completed/Met Goal: Pt Will Transfer Bed To Chair/Chair To Bed Outcome: Completed/Met Goal: Pt Will Ambulate Outcome: Completed/Met Goal: Pt Will Go Up/Down Stairs Outcome: Completed/Met   Problem: Education: Goal: Knowledge of General Education information will improve Description: Including pain rating scale, medication(s)/side effects and non-pharmacologic comfort measures Outcome: Completed/Met   Problem: Health Behavior/Discharge Planning: Goal: Ability to manage health-related needs will improve Outcome: Completed/Met   Problem: Clinical Measurements: Goal: Ability to maintain clinical measurements within normal limits will improve Outcome: Completed/Met Goal: Will remain free from infection Outcome: Completed/Met Goal: Diagnostic test results will improve Outcome: Completed/Met Goal: Respiratory complications will improve Outcome: Completed/Met Goal: Cardiovascular complication will be avoided Outcome: Completed/Met   Problem: Activity: Goal: Risk for activity intolerance will decrease Outcome: Completed/Met   Problem: Nutrition: Goal: Adequate nutrition will be maintained Outcome: Completed/Met   Problem: Coping: Goal: Level of anxiety will decrease Outcome: Completed/Met   Problem: Elimination: Goal: Will not experience complications related to bowel motility Outcome: Completed/Met Goal: Will not experience complications related to urinary retention Outcome: Completed/Met   Problem:  Pain Managment: Goal: General experience of comfort will improve Outcome: Completed/Met   Problem: Safety: Goal: Ability to remain free from injury will improve Outcome: Completed/Met   Problem: Education: Goal: Ability to describe self-care measures that may prevent or decrease complications (Diabetes Survival Skills Education) will improve Outcome: Completed/Met Goal: Individualized Educational Video(s) Outcome: Completed/Met   Problem: Coping: Goal: Ability to adjust to condition or change in health will improve Outcome: Completed/Met

## 2022-09-09 NOTE — Procedures (Signed)
Interventional Radiology Procedure Note  Procedure: Image guided paracentesis Complications: None  EBL: None    Recommendations:   - routine wound care  Signed,  Yvone Neu. Loreta Ave, DO

## 2022-09-09 NOTE — Progress Notes (Signed)
The patient has been discharged. IV has been removed. Education has been provided to the patient and family using the teach back method. The patient has been wheeled down to his vehicle.

## 2022-09-09 NOTE — Discharge Summary (Signed)
Physician Discharge Summary   Patient: Shawn [name] MRN: [id number]  DOB: June 09, 1955   Admit:     Date of Admission: 09/03/2022 Admitted from: home   Discharge: Date of discharge: 09/09/22 Disposition: Home health Condition at discharge: fair  CODE STATUS: FULL CODE     Discharge Physician: Sunnie Nielsen, DO [facility]     PCP: [facility], [facility]  Recommendations for Outpatient Follow-up:  Follow up with PCP Center, [facility] in 1-2 weeks - discuss possible hospice / palliative care if not participating with home health PT/OT       Discharge Diagnoses: Principal Problem:   Sepsis ([name]) Active Problems:   Hypothermia   Hypoglycemia   Essential hypertension   Iron deficiency anemia   Smoker   HLD (hyperlipidemia)   ESRD on dialysis ([name])   HFrEF (heart failure with reduced ejection fraction) [facility])       Hospital Course: Shawn [name] is a 67 y.o. male with medical history significant of HFpEF, ESRD on hemodialysis Monday Wednesday Friday, hypertension, type 2 diabetes, substance abuse, history of CVA presenting with severe chills with noted blood sugar in the 20s. Noted admission August 24 through August 27 for issues including DKA associated with missed insulin dosing, hyperkalemia as well as anasarca.  08/30 to ED and admitted to hospitalist service w/ sepsis concern for UTI, hypothermia, hypoglycemia. Glc stabilized over next few days. Sepsis eventually r/o. PT/OT recs for SNF but pt declined this in favor of going home, TOC arranging HH. Paracentesis finally got done 09/09/22 and pt stable for discharge w/ home health.    Consultants:  Nephrology   Procedures: Pending paracentesis       ASSESSMENT & PLAN:   Hypoglycemia DM2 with Hypo/Hyperglycemia W/ ESRD, pt would be more sensitive to insulin.   However, DM poorly controlled, A1c 11.0. cont home glargine 5u daily increase mealtime to 6u TID ACHS and  SSI Stressed to pt that he should only take short-acting insulin when he eats a meal  Hypothermia, resolved Tmax 90-92, improved with bear hugger Suspect likely due to hypoglycemia Monitor temp and Glc   Essential hypertension  on home torsemide on non-dialysis days   Hx of Iron deficiency anemia Anemia with chronic kidney disease Baseline hgb 8-10  ESA with MWF scheduled HD treatments.    Smoker 1/4 pack/day smoker Discussed cessation with admitting physician Nicotine patch   HLD (hyperlipidemia) Cont statin    HFrEF (heart failure with reduced ejection fraction) ([name]) 2D echo June 2023 with EF of 20 to 25% Baseline volume status mediated by predominantly hemodialysis    ESRD on dialysis ([name]) MWF HD per nephrology   Abdominal distention initially thought to be due to urinary retention, however, I/O cath with only ~20 ml urine obtained. pt had CT a/p on 08/28/22 from last hospitalization that showed large ascites. US paracentesis ordered and still pending     Sepsis ([name]), ruled out no source of infection.  Urine cx neg growth. d/c'ed abx         Discharge Instructions  Allergies as of 09/09/2022       Reactions   Empagliflozin    Other Reaction(s): Acidosis   Pravastatin    Other reaction(s): Muscle pain   Simvastatin    Other reaction(s): Muscle pain        Medication List     TAKE these medications    acetaminophen 325 MG tablet Commonly known as: TYLENOL Take 2 tablets (  650 mg total) by mouth every 6 (six) hours as needed for mild pain (or Fever >/= 101).   ammonium lactate 12 % lotion Commonly known as: LAC-HYDRIN Apply 1 Application topically as needed for dry skin.   aspirin EC 81 MG tablet Take 81 mg by mouth daily.   atorvastatin 80 MG tablet Commonly known as: LIPITOR Take 40 mg by mouth at bedtime.   cholecalciferol 25 MCG (1000 UNIT) tablet Commonly known as: VITAMIN D3 Take 2,000 Units by mouth daily.   dextrose 40 %  Gel Commonly known as: GLUTOSE Take 1 Tube by mouth once as needed for low blood sugar.   feeding supplement (GLUCERNA SHAKE) Liqd Take 237 mLs by mouth 3 (three) times daily between meals.   hydrOXYzine 10 MG tablet Commonly known as: ATARAX Take 10 mg by mouth 4 (four) times daily - after meals and at bedtime.   insulin glargine-yfgn 100 UNIT/ML injection Commonly known as: SEMGLEE Inject 0.05 mLs (5 Units total) into the skin daily at 12 noon.   multivitamin with minerals Tabs tablet Take 1 tablet by mouth daily.   omeprazole 20 MG capsule Commonly known as: PRILOSEC Take 20 mg by mouth daily.   polyethylene glycol 17 g packet Commonly known as: MIRALAX / GLYCOLAX Take 17 g by mouth daily.   Synthroid 75 MCG tablet Generic drug: levothyroxine Take 75 mcg by mouth daily before breakfast.   tamsulosin 0.4 MG Caps capsule Commonly known as: FLOMAX Take 1 capsule (0.4 mg total) by mouth daily after supper.   torsemide 20 MG tablet Commonly known as: DEMADEX Take 60 mg by mouth as directed. Take 3 tablets (60mg ) by mouth every Sunday, Tuesday, Thursday and Saturday          Allergies  Allergen Reactions   Empagliflozin     Other Reaction(s): Acidosis   Pravastatin     Other reaction(s): Muscle pain   Simvastatin     Other reaction(s): Muscle pain     Subjective: pt feeling better after paracentesis this morning, has no complaints or concerns    Discharge Exam: BP 118/74 (BP Location: Left Arm)   Pulse 81   Temp 98.3 F (36.8 C) (Oral)   Resp 18   Ht 5\' 11"  (1.803 m)   Wt 70.4 kg   SpO2 98%   BMI 21.65 kg/m  General: Pt is alert, awake, not in acute distress Cardiovascular: RRR, S1/S2 +, no rubs, no gallops Respiratory: CTA bilaterally, no wheezing, no rhonchi Abdominal: Soft, NT, ND, bowel sounds + Extremities: no edema, no cyanosis     The results of significant diagnostics from this hospitalization (including imaging, microbiology,  ancillary and laboratory) are listed below for reference.     Microbiology: Recent Results (from the past 240 hour(s))  Blood culture (routine x 2)     Status: None   Collection Time: 09/03/22  5:33 AM   Specimen: BLOOD LEFT ARM  Result Value Ref Range Status   Specimen Description BLOOD LEFT ARM  Final   Special Requests   Final    BOTTLES DRAWN AEROBIC AND ANAEROBIC Blood Culture adequate volume   Culture   Final    NO GROWTH 5 DAYS Performed at [facility], [address], [place], [place] [address]    Report Status 09/08/2022 FINAL  Final  SARS Coronavirus 2 by RT PCR (hospital order, performed in [facility] hospital lab) *cepheid single result test* Anterior Nasal Swab     Status: None  Collection Time: 09/03/22  5:33 AM   Specimen: Anterior Nasal Swab  Result Value Ref Range Status   SARS Coronavirus 2 by RT PCR NEGATIVE NEGATIVE Final    Comment: (NOTE) SARS-CoV-2 target nucleic acids are NOT DETECTED.  The SARS-CoV-2 RNA is generally detectable in upper and lower respiratory specimens during the acute phase of infection. The lowest concentration of SARS-CoV-2 viral copies this assay can detect is 250 copies / mL. A negative result does not preclude SARS-CoV-2 infection and should not be used as the sole basis for treatment or other patient management decisions.  A negative result may occur with improper specimen collection / handling, submission of specimen other than nasopharyngeal swab, presence of viral mutation(s) within the areas targeted by this assay, and inadequate number of viral copies (<250 copies / mL). A negative result must be combined with clinical observations, patient history, and epidemiological information.  Fact Sheet for Patients:   RoadLapTop.co.za  Fact Sheet for Healthcare Providers: http://kim-miller.com/  This test is not yet approved or  cleared by the [place] FDA and has been  authorized for detection and/or diagnosis of SARS-CoV-2 by FDA under an Emergency Use Authorization (EUA).  This EUA will remain in effect (meaning this test can be used) for the duration of the COVID-19 declaration under Section 564(b)(1) of the Act, 21 U.S.C. section 360bbb-3(b)(1), unless the authorization is terminated or revoked sooner.  Performed at [facility], [address] [place] [address], [place], [place] [address]   Urine Culture     Status: None   Collection Time: 09/03/22  5:43 AM   Specimen: Urine, Clean Catch  Result Value Ref Range Status   Specimen Description   Final    URINE, CLEAN CATCH Performed at [facility], [address], [place], [place] [address]    Special Requests   Final    NONE Performed at [facility], [address], [place], [place] [address]    Culture   Final    NO GROWTH Performed at [facility], [address], [place], [place] [address]    Report Status 09/04/2022 FINAL  Final  Blood culture (routine x 2)     Status: None   Collection Time: 09/03/22  6:30 AM   Specimen: BLOOD  Result Value Ref Range Status   Specimen Description BLOOD BLOOD RIGHT ARM  Final   Special Requests   Final    BOTTLES DRAWN AEROBIC AND ANAEROBIC Blood Culture adequate volume   Culture   Final    NO GROWTH 5 DAYS Performed at [facility], [address] [place], [place] [address]    Report Status 09/08/2022 FINAL  Final     Labs: BNP (last 3 results) Recent Labs    09/03/22 0422  BNP >4,500.0*   Basic Metabolic Panel: Recent Labs  Lab 09/03/22 0422 09/04/22 0527 09/05/22 0504 09/06/22 0330 09/08/22 1222  NA 134* 129* 130* 132* 132*  K 4.3 4.8 4.8 4.1 4.6  CL 92* 95* 95* 95* 94*  CO2 24 22 21* 24 26  GLUCOSE 114* 197* 146* 148* 213*  BUN 57* 69* 76* 52* 47*  CREATININE 6.45* 6.78* 7.48* 5.16* 4.83*  CALCIUM 8.9 8.1* 8.1* 7.9* 8.1*  MG 2.4  --  2.4 2.1  --   PHOS  --   --   --   --  2.6   Liver  Function Tests: Recent Labs  Lab 09/04/22 0527 09/08/22 1222  AST 12*  --  ALT 7  --   ALKPHOS 67  --   BILITOT 0.3  --   PROT 5.8*  --   ALBUMIN 2.7* 2.3*   No results for input(s): "LIPASE", "AMYLASE" in the last 168 hours. No results for input(s): "AMMONIA" in the last 168 hours. CBC: Recent Labs  Lab 09/03/22 0422 09/04/22 0527 09/05/22 0504 09/06/22 0330 09/08/22 1222  WBC 2.8* 3.7* 4.0 3.6* 4.5  NEUTROABS 2.1  --   --   --   --   HGB 9.9* 9.4* 8.8* 9.2* 9.4*  HCT 29.7* 28.4* 26.2* 28.8* 28.7*  MCV 87.1 86.9 86.2 91.1 88.3  PLT 170 197 229 208 284   Cardiac Enzymes: Recent Labs  Lab 09/03/22 0953  CKTOTAL 89   BNP: Invalid input(s): "POCBNP" CBG: Recent Labs  Lab 09/08/22 0751 09/08/22 1214 09/08/22 1715 09/08/22 2103 09/09/22 0744  GLUCAP 78 202* 122* 98 155*   D-Dimer No results for input(s): "DDIMER" in the last 72 hours. Hgb A1c No results for input(s): "HGBA1C" in the last 72 hours. Lipid Profile No results for input(s): "CHOL", "HDL", "LDLCALC", "TRIG", "CHOLHDL", "LDLDIRECT" in the last 72 hours. Thyroid function studies No results for input(s): "TSH", "T4TOTAL", "T3FREE", "THYROIDAB" in the last 72 hours.  Invalid input(s): "FREET3" Anemia work up No results for input(s): "VITAMINB12", "FOLATE", "FERRITIN", "TIBC", "IRON", "RETICCTPCT" in the last 72 hours. Urinalysis    Component Value Date/Time   COLORURINE YELLOW (A) 09/03/2022 0543   APPEARANCEUR CLOUDY (A) 09/03/2022 0543   LABSPEC 1.019 09/03/2022 0543   PHURINE 5.0 09/03/2022 0543   GLUCOSEU 150 (A) 09/03/2022 0543   HGBUR LARGE (A) 09/03/2022 0543   BILIRUBINUR NEGATIVE 09/03/2022 0543   KETONESUR NEGATIVE 09/03/2022 0543   PROTEINUR >=300 (A) 09/03/2022 0543   NITRITE NEGATIVE 09/03/2022 0543   LEUKOCYTESUR MODERATE (A) 09/03/2022 0543   Sepsis Labs Recent Labs  Lab 09/04/22 0527 09/05/22 0504 09/06/22 0330 09/08/22 1222  WBC 3.7* 4.0 3.6* 4.5    Microbiology Recent Results (from the past 240 hour(s))  Blood culture (routine x 2)     Status: None   Collection Time: 09/03/22  5:33 AM   Specimen: BLOOD LEFT ARM  Result Value Ref Range Status   Specimen Description BLOOD LEFT ARM  Final   Special Requests   Final    BOTTLES DRAWN AEROBIC AND ANAEROBIC Blood Culture adequate volume   Culture   Final    NO GROWTH 5 DAYS Performed at [facility], [address] [place], [place] [address]    Report Status 09/08/2022 FINAL  Final  SARS Coronavirus 2 by RT PCR (hospital order, performed in [facility] hospital lab) *cepheid single result test* Anterior Nasal Swab     Status: None   Collection Time: 09/03/22  5:33 AM   Specimen: Anterior Nasal Swab  Result Value Ref Range Status   SARS Coronavirus 2 by RT PCR NEGATIVE NEGATIVE Final    Comment: (NOTE) SARS-CoV-2 target nucleic acids are NOT DETECTED.  The SARS-CoV-2 RNA is generally detectable in upper and lower respiratory specimens during the acute phase of infection. The lowest concentration of SARS-CoV-2 viral copies this assay can detect is 250 copies / mL. A negative result does not preclude SARS-CoV-2 infection and should not be used as the sole basis for treatment or other patient management decisions.  A negative result may occur with improper specimen collection / handling, submission of specimen other than nasopharyngeal swab, presence of viral mutation(s) within the areas targeted by  this assay, and inadequate number of viral copies (<250 copies / mL). A negative result must be combined with clinical observations, patient history, and epidemiological information.  Fact Sheet for Patients:   RoadLapTop.co.za  Fact Sheet for Healthcare Providers: http://kim-miller.com/  This test is not yet approved or  cleared by the [place] FDA and has been authorized for detection and/or diagnosis of SARS-CoV-2  by FDA under an Emergency Use Authorization (EUA).  This EUA will remain in effect (meaning this test can be used) for the duration of the COVID-19 declaration under Section 564(b)(1) of the Act, 21 U.S.C. section 360bbb-3(b)(1), unless the authorization is terminated or revoked sooner.  Performed at [facility], [address], [place], [place] [address]   Urine Culture     Status: None   Collection Time: 09/03/22  5:43 AM   Specimen: Urine, Clean Catch  Result Value Ref Range Status   Specimen Description   Final    URINE, CLEAN CATCH Performed at [facility], [address], [place], [place] [address]    Special Requests   Final    NONE Performed at [facility], [address], [place], [place] [address]    Culture   Final    NO GROWTH Performed at [facility], [address] [place], [place] [address]    Report Status 09/04/2022 FINAL  Final  Blood culture (routine x 2)     Status: None   Collection Time: 09/03/22  6:30 AM   Specimen: BLOOD  Result Value Ref Range Status   Specimen Description BLOOD BLOOD RIGHT ARM  Final   Special Requests   Final    BOTTLES DRAWN AEROBIC AND ANAEROBIC Blood Culture adequate volume   Culture   Final    NO GROWTH 5 DAYS Performed at [facility], [address], [place], [place] [address]    Report Status 09/08/2022 FINAL  Final   Imaging DG Chest Portable 1 View  Result Date: 09/03/2022 CLINICAL DATA:  67 year old male with hypoglycemia, shortness of breath. EXAM: PORTABLE CHEST 1 VIEW COMPARISON:  Portable chest radiograph 08/29/2022 and earlier. CT Abdomen and Pelvis 08/28/2022. FINDINGS: Portable AP upright view at 0521 hours. Mildly more rotated to the right. Stable right chest dual lumen dialysis type catheter. Stable cardiomegaly and mediastinal contours. Visualized tracheal air column is within normal limits. Veiling left lung opacity is new. No pneumothorax. No consolidation.  Stable pulmonary vascularity, with diffuse vascular congestion. No acute osseous abnormality identified. Paucity of bowel gas in the visible abdomen. IMPRESSION: 1. Veiling left lung opacity suggesting new or increased left pleural effusion. 2. Otherwise stable Cardiomegaly with vascular congestion/interstitial edema. Electronically Signed   By: Odessa Fleming M.D.   On: 09/03/2022 06:14      Time coordinating discharge: over 30 minutes  SIGNED:  Sunnie Nielsen DO [facility]

## 2022-09-09 NOTE — Plan of Care (Signed)
  Problem: Fluid Volume: Goal: Hemodynamic stability will improve Outcome: Progressing   Problem: Education: Goal: Knowledge of General Education information will improve Description: Including pain rating scale, medication(s)/side effects and non-pharmacologic comfort measures Outcome: Progressing   Problem: Health Behavior/Discharge Planning: Goal: Ability to manage health-related needs will improve Outcome: Progressing   Problem: Coping: Goal: Ability to adjust to condition or change in health will improve Outcome: Progressing

## 2022-09-09 NOTE — TOC Transition Note (Signed)
Transition of Care [facility]) - CM/SW Discharge Note   Patient Details  Name: Shawn [name] MRN: [id number] Date of Birth: 03-16-1955  Transition of [facility]) CM/SW Contact:  Chapman Fitch, RN Phone Number: 09/09/2022, 2:30 PM   Clinical Narrative:     Patient to discharge today [name] with medi notified and home health orders secure emailed Clinical secure emailed to [place] at [facility] dialysis liaison notified of discharge Sister to transport at discharge     Barriers to Discharge: Continued Medical Work up   Patient Goals and CMS Choice CMS Medicare.gov Compare Post Acute Care list provided to:: Patient Choice offered to / list presented to : Patient  Discharge Placement                         Discharge Plan and Services Additional resources added to the After Visit Summary for                                       Social Determinants of Health (SDOH) Interventions SDOH Screenings   Food Insecurity: No Food Insecurity (09/03/2022)  Housing: Low Risk  (09/03/2022)  Transportation Needs: No Transportation Needs (09/03/2022)  Utilities: Not At Risk (09/03/2022)  Financial Resource Strain: High Risk (08/24/2019)   [facility], [facility]  Social Connections: Moderately Isolated (08/24/2019)   [facility] from [facility], [facility]  Stress: No Stress Concern Present (08/24/2019)   [facility]  Tobacco Use: High Risk (09/03/2022)     Readmission Risk Interventions    09/04/2022   11:28 AM 06/24/2021    2:57 PM 01/08/2021    1:22 PM  Readmission Risk Prevention Plan  Transportation Screening Complete Complete Complete  PCP or Specialist Appt within 3-5 Days Complete    HRI or Home Care Consult Complete    Social Work Consult for Recovery Care Planning/Counseling Complete  Complete   Palliative Care Screening Not Applicable  Not Applicable  Medication Review Oceanographer) Complete Complete Complete  Palliative Care Screening  Not Applicable   Skilled Nursing Facility  Not Applicable

## 2022-09-09 NOTE — Progress Notes (Signed)
[facility]  ROUNDING NOTE   Subjective:   Mr. SHLOME GARRIDO was admitted to [facility] on 09/03/2022 for Sinus bradycardia [R00.1] Hypoglycemia [E16.2] Acute UTI [N39.0] Hypothermia, initial encounter [T68.XXXA] Sepsis [facility]) [A41.9]  Patient is known to our practice and receives dialysis at [facility] on a MWF schedule, He was recently admitted for hyperkalemia and discharge on 08/31/22. He presented again for hypoglycemia and found to have sepsis.   Patient seen resting in bed, covers over face Denies pain or discomfort No complaints to offer  Dialysis received yesterday, tolerated well Paracentesis completed this morning by IR  Objective:  Vital signs in last 24 hours:  Temp:  [97.9 F (36.6 C)-98.3 F (36.8 C)] 98.3 F (36.8 C) (09/05 0905) Pulse Rate:  [65-90] 81 (09/05 0905) Resp:  [12-18] 18 (09/05 0905) BP: (77-139)/(43-90) 118/74 (09/05 0905) SpO2:  [91 %-100 %] 98 % (09/05 0905)  Weight change:  Filed Weights   09/03/22 0417  Weight: 70.4 kg    Intake/Output: I/O last 3 completed shifts: In: -  Out: 800 [Other:800]   Intake/Output this shift:  No intake/output data recorded.  Physical Exam: General: NAD  Head: Normocephalic, atraumatic. Moist oral mucosal membranes  Eyes: Anicteric  Lungs:  Clear to auscultation  Heart: Regular rate and rhythm  Abdomen:  Soft, nontender, distended  Extremities: + left > right peripheral edema.  Neurologic: Alert, moving all four extremities  Skin: No lesions  Access: RIJ permcath    Basic Metabolic Panel: Recent Labs  Lab 09/03/22 0422 09/04/22 0527 09/05/22 0504 09/06/22 0330 09/08/22 1222  NA 134* 129* 130* 132* 132*  K 4.3 4.8 4.8 4.1 4.6  CL 92* 95* 95* 95* 94*  CO2 24 22 21* 24 26  GLUCOSE 114* 197* 146* 148* 213*  BUN 57* 69* 76* 52* 47*  CREATININE 6.45* 6.78* 7.48* 5.16* 4.83*  CALCIUM 8.9 8.1* 8.1* 7.9* 8.1*  MG 2.4  --  2.4 2.1  --   PHOS  --   --   --   --  2.6     Liver Function Tests: Recent Labs  Lab 09/04/22 0527 09/08/22 1222  AST 12*  --   ALT 7  --   ALKPHOS 67  --   BILITOT 0.3  --   PROT 5.8*  --   ALBUMIN 2.7* 2.3*   No results for input(s): "LIPASE", "AMYLASE" in the last 168 hours. No results for input(s): "AMMONIA" in the last 168 hours.  CBC: Recent Labs  Lab 09/03/22 0422 09/04/22 0527 09/05/22 0504 09/06/22 0330 09/08/22 1222  WBC 2.8* 3.7* 4.0 3.6* 4.5  NEUTROABS 2.1  --   --   --   --   HGB 9.9* 9.4* 8.8* 9.2* 9.4*  HCT 29.7* 28.4* 26.2* 28.8* 28.7*  MCV 87.1 86.9 86.2 91.1 88.3  PLT 170 197 229 208 284    Cardiac Enzymes: Recent Labs  Lab 09/03/22 0953  CKTOTAL 89    BNP: Invalid input(s): "POCBNP"  CBG: Recent Labs  Lab 09/08/22 0751 09/08/22 1214 09/08/22 1715 09/08/22 2103 09/09/22 0744  GLUCAP 78 202* 122* 98 155*    Microbiology: Results for orders placed or performed during the hospital encounter of 09/03/22  Blood culture (routine x 2)     Status: None   Collection Time: 09/03/22  5:33 AM   Specimen: BLOOD LEFT ARM  Result Value Ref Range Status   Specimen Description BLOOD LEFT ARM  Final   Special Requests  Final    BOTTLES DRAWN AEROBIC AND ANAEROBIC Blood Culture adequate volume   Culture   Final    NO GROWTH 5 DAYS Performed at [facility], [address] [place], [place] [address]    Report Status 09/08/2022 FINAL  Final  SARS Coronavirus 2 by RT PCR (hospital order, performed in [facility] hospital lab) *cepheid single result test* Anterior Nasal Swab     Status: None   Collection Time: 09/03/22  5:33 AM   Specimen: Anterior Nasal Swab  Result Value Ref Range Status   SARS Coronavirus 2 by RT PCR NEGATIVE NEGATIVE Final    Comment: (NOTE) SARS-CoV-2 target nucleic acids are NOT DETECTED.  The SARS-CoV-2 RNA is generally detectable in upper and lower respiratory specimens during the acute phase of infection. The lowest concentration of SARS-CoV-2  viral copies this assay can detect is 250 copies / mL. A negative result does not preclude SARS-CoV-2 infection and should not be used as the sole basis for treatment or other patient management decisions.  A negative result may occur with improper specimen collection / handling, submission of specimen other than nasopharyngeal swab, presence of viral mutation(s) within the areas targeted by this assay, and inadequate number of viral copies (<250 copies / mL). A negative result must be combined with clinical observations, patient history, and epidemiological information.  Fact Sheet for Patients:   RoadLapTop.co.za  Fact Sheet for Healthcare Providers: http://kim-miller.com/  This test is not yet approved or  cleared by the [place] FDA and has been authorized for detection and/or diagnosis of SARS-CoV-2 by FDA under an Emergency Use Authorization (EUA).  This EUA will remain in effect (meaning this test can be used) for the duration of the COVID-19 declaration under Section 564(b)(1) of the Act, 21 U.S.C. section 360bbb-3(b)(1), unless the authorization is terminated or revoked sooner.  Performed at [facility], [address], [place], [place] [address]   Urine Culture     Status: None   Collection Time: 09/03/22  5:43 AM   Specimen: Urine, Clean Catch  Result Value Ref Range Status   Specimen Description   Final    URINE, CLEAN CATCH Performed at [facility], [address], [place], [place] [address]    Special Requests   Final    NONE Performed at [facility], [address], [place], [place] [address]    Culture   Final    NO GROWTH Performed at [facility], [address] [place], [place] [address]    Report Status 09/04/2022 FINAL  Final  Blood culture (routine x 2)     Status: None   Collection Time: 09/03/22  6:30 AM   Specimen: BLOOD  Result Value Ref Range Status    Specimen Description BLOOD BLOOD RIGHT ARM  Final   Special Requests   Final    BOTTLES DRAWN AEROBIC AND ANAEROBIC Blood Culture adequate volume   Culture   Final    NO GROWTH 5 DAYS Performed at [facility], [address], [place], [place] [address]    Report Status 09/08/2022 FINAL  Final    Coagulation Studies: No results for input(s): "LABPROT", "INR" in the last 72 hours.   Urinalysis: No results for input(s): "COLORURINE", "LABSPEC", "PHURINE", "GLUCOSEU", "HGBUR", "BILIRUBINUR", "KETONESUR", "PROTEINUR", "UROBILINOGEN", "NITRITE", "LEUKOCYTESUR" in the last 72 hours.  Invalid input(s): "APPERANCEUR"     Imaging: IR Paracentesis  Result Date: 09/09/2022 INDICATION: 67 year old male referred for paracentesis EXAM: ULTRASOUND  GUIDED  PARACENTESIS MEDICATIONS: None. COMPLICATIONS: None PROCEDURE: Informed written consent was obtained from the patient after a discussion of the risks, benefits and alternatives to treatment. A timeout was performed prior to the initiation of the procedure. Initial ultrasound scanning demonstrates a moderate amount of ascites within the right lower abdominal quadrant. The right lower abdomen was prepped and draped in the usual sterile fashion. 1% lidocaine with epinephrine was used for local anesthesia. Following this, a 8 Fr Safe-T-Centesis catheter was introduced. An ultrasound image was saved for documentation purposes. The paracentesis was performed. The catheter was removed and a dressing was applied. The patient tolerated the procedure well without immediate post procedural complication. FINDINGS: A total of approximately 1,450 cc of thin yellow fluid was removed. Samples were sent to the laboratory as requested by the clinical team. IMPRESSION: Status post image guided paracentesis. Signed, Yvone Neu. Miachel Roux, RPVI [facility] Radiology Electronically Signed   By: Gilmer Mor D.O.    On: 09/09/2022 10:41     Medications:    sodium chloride      aspirin EC  81 mg Oral Daily   atorvastatin  40 mg Oral QHS   Chlorhexidine Gluconate Cloth  6 each Topical Q0600   epoetin (EPOGEN/PROCRIT) injection  4,000 Units Intravenous Q M,W,F-HD   heparin  5,000 Units Subcutaneous Q8H   insulin aspart  0-9 Units Subcutaneous TID WC   insulin aspart  6 Units Subcutaneous TID WC   insulin glargine-yfgn  5 Units Subcutaneous Q1200   levothyroxine  75 mcg Oral QAC breakfast   nicotine  7 mg Transdermal Daily   pantoprazole  40 mg Oral Daily   sodium chloride flush  3 mL Intravenous Q12H   tamsulosin  0.4 mg Oral QPC supper   sodium chloride, acetaminophen, alteplase, dextrose, heparin, mouth rinse, sodium chloride flush  Assessment/ Plan:  [name] is a 67 y.o.  male with end stage renal disease on hemodialysis, hypertension, diabetes mellitus type II, congestive heart failure, coronary artery disease, CVA, alcohol abuse, tobacco abuse and hypothyroidism who presents to Sinus bradycardia [R00.1] Hypoglycemia [E16.2] Acute UTI [N39.0] Hypothermia, initial encounter [T68.XXXA] Sepsis ([name]) [A41.9]   [facility] MWF [facility] RIJ permcath 64kg  End Stage Renal Disease with LE edema. -Dialysis received yesterday, UF 800 mL achieved. -Next treatment scheduled for Friday   Diabetes mellitus type II with chronic kidney disease:  -Hypoglycemia with EMS.  - Hgb A1c 11 % on 08/29/22  -Managed with semglee, NovoLog   Anemia with chronic kidney disease: macrocytic - Low dose ESA with HD treatments.  - Hgb 9.4 on 09/08/2022 - Remains on Mircera at outpatient clinic   4. Secondary Hyperparathyroidism: with outpatient labs: none available.   Lab Results  Component Value Date   CALCIUM 8.1 (L) 09/08/2022   PHOS 2.6 09/08/2022    Will continue to monitor bone minerals during this admission.   LOS: 6 Ellan Tess 9/5/202411:52 AM

## 2022-09-12 LAB — BODY FLUID CULTURE W GRAM STAIN
Culture: NO GROWTH
Gram Stain: NONE SEEN

## 2022-09-13 NOTE — Group Note (Deleted)

## 2022-10-24 ENCOUNTER — Other Ambulatory Visit: Payer: Self-pay

## 2022-10-24 ENCOUNTER — Emergency Department
Admission: EM | Admit: 2022-10-24 | Discharge: 2022-10-24 | Disposition: A | Discharge status: Home / Self Care | Payer: No Typology Code available for payment source | Attending: Emergency Medicine | Admitting: Emergency Medicine

## 2022-10-24 DIAGNOSIS — R531 Weakness: Secondary | ICD-10-CM | POA: Diagnosis present

## 2022-10-24 DIAGNOSIS — Z992 Dependence on renal dialysis: Secondary | ICD-10-CM | POA: Insufficient documentation

## 2022-10-24 DIAGNOSIS — E162 Hypoglycemia, unspecified: Secondary | ICD-10-CM

## 2022-10-24 DIAGNOSIS — I509 Heart failure, unspecified: Secondary | ICD-10-CM | POA: Diagnosis not present

## 2022-10-24 DIAGNOSIS — I132 Hypertensive heart and chronic kidney disease with heart failure and with stage 5 chronic kidney disease, or end stage renal disease: Secondary | ICD-10-CM | POA: Diagnosis not present

## 2022-10-24 DIAGNOSIS — E11649 Type 2 diabetes mellitus with hypoglycemia without coma: Secondary | ICD-10-CM | POA: Insufficient documentation

## 2022-10-24 DIAGNOSIS — N186 End stage renal disease: Secondary | ICD-10-CM | POA: Insufficient documentation

## 2022-10-24 LAB — CBC
HCT: 36.9 % — ABNORMAL LOW (ref 39.0–52.0)
Hemoglobin: 11.5 g/dL — ABNORMAL LOW (ref 13.0–17.0)
MCH: 29.6 pg (ref 26.0–34.0)
MCHC: 31.2 g/dL (ref 30.0–36.0)
MCV: 95.1 fL (ref 80.0–100.0)
Platelets: 186 10*3/uL (ref 150–400)
RBC: 3.88 MIL/uL — ABNORMAL LOW (ref 4.22–5.81)
RDW: 17.2 % — ABNORMAL HIGH (ref 11.5–15.5)
WBC: 2.9 10*3/uL — ABNORMAL LOW (ref 4.0–10.5)
nRBC: 0 % (ref 0.0–0.2)

## 2022-10-24 LAB — BASIC METABOLIC PANEL
Anion gap: 13 (ref 5–15)
BUN: 55 mg/dL — ABNORMAL HIGH (ref 8–23)
CO2: 21 mmol/L — ABNORMAL LOW (ref 22–32)
Calcium: 8.4 mg/dL — ABNORMAL LOW (ref 8.9–10.3)
Chloride: 99 mmol/L (ref 98–111)
Creatinine, Ser: 5.76 mg/dL — ABNORMAL HIGH (ref 0.61–1.24)
GFR, Estimated: 10 mL/min — ABNORMAL LOW (ref >60)
Glucose, Bld: 80 mg/dL (ref 70–99)
Potassium: 4.1 mmol/L (ref 3.5–5.1)
Sodium: 133 mmol/L — ABNORMAL LOW (ref 135–145)

## 2022-10-24 LAB — CBG MONITORING, ED: Glucose-Capillary: 87 mg/dL (ref 70–99)

## 2022-10-24 NOTE — ED Triage Notes (Signed)
Pt brought from home by EMS for glucose of 51. EMS started bag of D10 and glucose up to 72 for EMS. Pt A&Ox4. Pt states glucose wouldn't come up after eating and having orange juice.

## 2022-10-24 NOTE — ED Provider Notes (Signed)
[facility] Provider Note    Event Date/Time   First MD Initiated Contact with Patient 10/24/22 0831     (approximate)  History   Chief Complaint: Hypoglycemia  HPI  Shawn [name] is a 67 y.o. male with a past medical history of CHF, diabetes, hypertension, ESRD on HD Monday/Wednesday/Friday, presents to the emergency department for low blood sugar.  According to the patient overnight last night patient states he was feeling weak checked his blood sugar and it was down into the 50s and 40s at 1 point.  States he drank sugar and it would not come up so he called EMS who brought him to the emergency department for evaluation.  EMS gave the patient 250 cc of D10 and route to the hospital.  On arrival here patient's blood glucose is 87 on fingerstick.  Patient received a full dialysis session on Friday it is scheduled to go tomorrow.  Patient states he is feeling better in fact he is feeling hungry and is requesting a breakfast tray.  Physical Exam   Triage Vital Signs: ED Triage Vitals  Encounter Vitals Group     BP 10/24/22 0706 131/84     Systolic BP Percentile --      Diastolic BP Percentile --      Pulse Rate 10/24/22 0706 (!) 58     Resp 10/24/22 0706 17     Temp --      Temp src --      SpO2 10/24/22 0706 94 %     Weight 10/24/22 0703 145 lb (65.8 kg)     Height 10/24/22 0703 5\' 11"  (1.803 m)     Head Circumference --      Peak Flow --      Pain Score 10/24/22 0703 0     Pain Loc --      Pain Education --      Exclude from Growth Chart --     Most recent vital signs: Vitals:   10/24/22 0706  BP: 131/84  Pulse: (!) 58  Resp: 17  SpO2: 94%    General: Awake, no distress.  CV:  Good peripheral perfusion.  Regular rate and rhythm  Resp:  Normal effort.  Equal breath sounds bilaterally.  Abd:  No distention.  Soft, nontender.  No rebound or guarding. Other:  Right subclavian dialysis line present   ED Results / Procedures /  Treatments   MEDICATIONS ORDERED IN ED: Medications - No data to display   IMPRESSION / MDM / ASSESSMENT AND PLAN / ED COURSE  I reviewed the triage vital signs and the nursing notes.  Patient's presentation is most consistent with acute presentation with potential threat to life or bodily function.  Patient presents emergency department for generalized weakness found to be hypoglycemic by EMS which has come up with D10 infusion.  We will check labs we will continue to closely monitor.  Will feed the patient while awaiting lab results.  Patient agreeable to plan of care and workup.  Patient's lab work is reassuring potassium of 4.1.  Patient CBC shows no concerning findings.  Glucose of 80.  Patient has eaten a full meal.  He denies any significant symptoms currently.  Will discharge home.  Patient will follow-up for his typical dialysis tomorrow.  FINAL CLINICAL IMPRESSION(S) / ED DIAGNOSES   Weakness Hypoglycemia    Note:  This document was prepared using Dragon voice recognition software and may include unintentional dictation errors.  Minna Antis, MD 10/24/22 1110

## 2022-10-24 NOTE — Discharge Instructions (Addendum)
Please follow-up tomorrow for your scheduled dialysis.  Please continue to eat small meals throughout the day today to maintain your blood glucose.  Return to the emergency department for any symptom concerning to yourself.

## 2022-10-27 ENCOUNTER — Inpatient Hospital Stay
Admission: EM | Admit: 2022-10-27 | Discharge: 2022-11-01 | DRG: 637 | Disposition: A | Discharge status: Home / Self Care | Payer: No Typology Code available for payment source | Attending: Internal Medicine | Admitting: Internal Medicine

## 2022-10-27 ENCOUNTER — Emergency Department: Payer: No Typology Code available for payment source

## 2022-10-27 ENCOUNTER — Other Ambulatory Visit: Payer: Self-pay

## 2022-10-27 DIAGNOSIS — F17210 Nicotine dependence, cigarettes, uncomplicated: Secondary | ICD-10-CM | POA: Diagnosis present

## 2022-10-27 DIAGNOSIS — I428 Other cardiomyopathies: Secondary | ICD-10-CM | POA: Diagnosis present

## 2022-10-27 DIAGNOSIS — R112 Nausea with vomiting, unspecified: Secondary | ICD-10-CM | POA: Diagnosis present

## 2022-10-27 DIAGNOSIS — D631 Anemia in chronic kidney disease: Secondary | ICD-10-CM | POA: Diagnosis present

## 2022-10-27 DIAGNOSIS — F1010 Alcohol abuse, uncomplicated: Secondary | ICD-10-CM | POA: Diagnosis present

## 2022-10-27 DIAGNOSIS — I5020 Unspecified systolic (congestive) heart failure: Secondary | ICD-10-CM | POA: Diagnosis not present

## 2022-10-27 DIAGNOSIS — I2510 Atherosclerotic heart disease of native coronary artery without angina pectoris: Secondary | ICD-10-CM | POA: Diagnosis present

## 2022-10-27 DIAGNOSIS — Z716 Tobacco abuse counseling: Secondary | ICD-10-CM | POA: Diagnosis not present

## 2022-10-27 DIAGNOSIS — E1110 Type 2 diabetes mellitus with ketoacidosis without coma: Secondary | ICD-10-CM | POA: Diagnosis not present

## 2022-10-27 DIAGNOSIS — R609 Edema, unspecified: Secondary | ICD-10-CM | POA: Diagnosis present

## 2022-10-27 DIAGNOSIS — Z79899 Other long term (current) drug therapy: Secondary | ICD-10-CM

## 2022-10-27 DIAGNOSIS — N400 Enlarged prostate without lower urinary tract symptoms: Secondary | ICD-10-CM | POA: Diagnosis present

## 2022-10-27 DIAGNOSIS — I132 Hypertensive heart and chronic kidney disease with heart failure and with stage 5 chronic kidney disease, or end stage renal disease: Secondary | ICD-10-CM | POA: Diagnosis present

## 2022-10-27 DIAGNOSIS — T383X6A Underdosing of insulin and oral hypoglycemic [antidiabetic] drugs, initial encounter: Secondary | ICD-10-CM | POA: Diagnosis present

## 2022-10-27 DIAGNOSIS — E875 Hyperkalemia: Secondary | ICD-10-CM | POA: Diagnosis present

## 2022-10-27 DIAGNOSIS — Z79890 Hormone replacement therapy (postmenopausal): Secondary | ICD-10-CM

## 2022-10-27 DIAGNOSIS — Z681 Body mass index (BMI) 19 or less, adult: Secondary | ICD-10-CM

## 2022-10-27 DIAGNOSIS — E119 Type 2 diabetes mellitus without complications: Secondary | ICD-10-CM

## 2022-10-27 DIAGNOSIS — Z993 Dependence on wheelchair: Secondary | ICD-10-CM

## 2022-10-27 DIAGNOSIS — Z794 Long term (current) use of insulin: Secondary | ICD-10-CM | POA: Diagnosis not present

## 2022-10-27 DIAGNOSIS — E1122 Type 2 diabetes mellitus with diabetic chronic kidney disease: Secondary | ICD-10-CM | POA: Diagnosis present

## 2022-10-27 DIAGNOSIS — Z91128 Patient's intentional underdosing of medication regimen for other reason: Secondary | ICD-10-CM | POA: Diagnosis not present

## 2022-10-27 DIAGNOSIS — N186 End stage renal disease: Secondary | ICD-10-CM | POA: Diagnosis present

## 2022-10-27 DIAGNOSIS — E1010 Type 1 diabetes mellitus with ketoacidosis without coma: Secondary | ICD-10-CM | POA: Diagnosis not present

## 2022-10-27 DIAGNOSIS — E039 Hypothyroidism, unspecified: Secondary | ICD-10-CM | POA: Diagnosis present

## 2022-10-27 DIAGNOSIS — E43 Unspecified severe protein-calorie malnutrition: Secondary | ICD-10-CM | POA: Diagnosis present

## 2022-10-27 DIAGNOSIS — E11649 Type 2 diabetes mellitus with hypoglycemia without coma: Secondary | ICD-10-CM | POA: Diagnosis not present

## 2022-10-27 DIAGNOSIS — Z7982 Long term (current) use of aspirin: Secondary | ICD-10-CM

## 2022-10-27 DIAGNOSIS — Z992 Dependence on renal dialysis: Secondary | ICD-10-CM | POA: Diagnosis not present

## 2022-10-27 DIAGNOSIS — I5042 Chronic combined systolic (congestive) and diastolic (congestive) heart failure: Secondary | ICD-10-CM | POA: Diagnosis present

## 2022-10-27 DIAGNOSIS — F17200 Nicotine dependence, unspecified, uncomplicated: Secondary | ICD-10-CM | POA: Diagnosis present

## 2022-10-27 DIAGNOSIS — I10 Essential (primary) hypertension: Secondary | ICD-10-CM | POA: Diagnosis present

## 2022-10-27 DIAGNOSIS — Z8673 Personal history of transient ischemic attack (TIA), and cerebral infarction without residual deficits: Secondary | ICD-10-CM

## 2022-10-27 DIAGNOSIS — E871 Hypo-osmolality and hyponatremia: Secondary | ICD-10-CM | POA: Diagnosis present

## 2022-10-27 DIAGNOSIS — Z8249 Family history of ischemic heart disease and other diseases of the circulatory system: Secondary | ICD-10-CM | POA: Diagnosis not present

## 2022-10-27 DIAGNOSIS — E1310 Other specified diabetes mellitus with ketoacidosis without coma: Secondary | ICD-10-CM

## 2022-10-27 DIAGNOSIS — R011 Cardiac murmur, unspecified: Secondary | ICD-10-CM | POA: Diagnosis present

## 2022-10-27 DIAGNOSIS — Z1152 Encounter for screening for COVID-19: Secondary | ICD-10-CM | POA: Diagnosis not present

## 2022-10-27 DIAGNOSIS — Z888 Allergy status to other drugs, medicaments and biological substances status: Secondary | ICD-10-CM

## 2022-10-27 DIAGNOSIS — Z833 Family history of diabetes mellitus: Secondary | ICD-10-CM

## 2022-10-27 DIAGNOSIS — I5022 Chronic systolic (congestive) heart failure: Secondary | ICD-10-CM | POA: Diagnosis present

## 2022-10-27 LAB — CBC WITH DIFFERENTIAL/PLATELET
Abs Immature Granulocytes: 0.02 10*3/uL (ref 0.00–0.07)
Basophils Absolute: 0 10*3/uL (ref 0.0–0.1)
Basophils Relative: 0 %
Eosinophils Absolute: 0 10*3/uL (ref 0.0–0.5)
Eosinophils Relative: 0 %
HCT: 30.8 % — ABNORMAL LOW (ref 39.0–52.0)
Hemoglobin: 9.6 g/dL — ABNORMAL LOW (ref 13.0–17.0)
Immature Granulocytes: 0 %
Lymphocytes Relative: 6 %
Lymphs Abs: 0.4 10*3/uL — ABNORMAL LOW (ref 0.7–4.0)
MCH: 30.4 pg (ref 26.0–34.0)
MCHC: 31.2 g/dL (ref 30.0–36.0)
MCV: 97.5 fL (ref 80.0–100.0)
Monocytes Absolute: 0.6 10*3/uL (ref 0.1–1.0)
Monocytes Relative: 8 %
Neutro Abs: 6.3 10*3/uL (ref 1.7–7.7)
Neutrophils Relative %: 86 %
Platelets: 222 10*3/uL (ref 150–400)
RBC: 3.16 MIL/uL — ABNORMAL LOW (ref 4.22–5.81)
RDW: 16.4 % — ABNORMAL HIGH (ref 11.5–15.5)
WBC: 7.4 10*3/uL (ref 4.0–10.5)
nRBC: 0 % (ref 0.0–0.2)

## 2022-10-27 LAB — BASIC METABOLIC PANEL
Anion gap: 24 — ABNORMAL HIGH (ref 5–15)
Anion gap: 20 — ABNORMAL HIGH (ref 5–15)
Anion gap: 20 — ABNORMAL HIGH (ref 5–15)
BUN: 100 mg/dL — ABNORMAL HIGH (ref 8–23)
BUN: 100 mg/dL — ABNORMAL HIGH (ref 8–23)
BUN: 100 mg/dL — ABNORMAL HIGH (ref 8–23)
CO2: 10 mmol/L — ABNORMAL LOW (ref 22–32)
CO2: 14 mmol/L — ABNORMAL LOW (ref 22–32)
CO2: 16 mmol/L — ABNORMAL LOW (ref 22–32)
Calcium: 8.2 mg/dL — ABNORMAL LOW (ref 8.9–10.3)
Calcium: 8.1 mg/dL — ABNORMAL LOW (ref 8.9–10.3)
Calcium: 8.2 mg/dL — ABNORMAL LOW (ref 8.9–10.3)
Chloride: 97 mmol/L — ABNORMAL LOW (ref 98–111)
Chloride: 100 mmol/L (ref 98–111)
Chloride: 100 mmol/L (ref 98–111)
Creatinine, Ser: 7.57 mg/dL — ABNORMAL HIGH (ref 0.61–1.24)
Creatinine, Ser: 7.63 mg/dL — ABNORMAL HIGH (ref 0.61–1.24)
Creatinine, Ser: 7.72 mg/dL — ABNORMAL HIGH (ref 0.61–1.24)
GFR, Estimated: 7 mL/min — ABNORMAL LOW (ref >60)
GFR, Estimated: 7 mL/min — ABNORMAL LOW (ref >60)
GFR, Estimated: 7 mL/min — ABNORMAL LOW (ref >60)
Glucose, Bld: 415 mg/dL — ABNORMAL HIGH (ref 70–99)
Glucose, Bld: 298 mg/dL — ABNORMAL HIGH (ref 70–99)
Glucose, Bld: 167 mg/dL — ABNORMAL HIGH (ref 70–99)
Potassium: 6.1 mmol/L — ABNORMAL HIGH (ref 3.5–5.1)
Potassium: 4.7 mmol/L (ref 3.5–5.1)
Potassium: 4.7 mmol/L (ref 3.5–5.1)
Sodium: 131 mmol/L — ABNORMAL LOW (ref 135–145)
Sodium: 134 mmol/L — ABNORMAL LOW (ref 135–145)
Sodium: 136 mmol/L (ref 135–145)

## 2022-10-27 LAB — CBC
HCT: 31 % — ABNORMAL LOW (ref 39.0–52.0)
Hemoglobin: 9.8 g/dL — ABNORMAL LOW (ref 13.0–17.0)
MCH: 30.2 pg (ref 26.0–34.0)
MCHC: 31.6 g/dL (ref 30.0–36.0)
MCV: 95.4 fL (ref 80.0–100.0)
Platelets: 217 10*3/uL (ref 150–400)
RBC: 3.25 MIL/uL — ABNORMAL LOW (ref 4.22–5.81)
RDW: 16.4 % — ABNORMAL HIGH (ref 11.5–15.5)
WBC: 7 10*3/uL (ref 4.0–10.5)
nRBC: 0 % (ref 0.0–0.2)

## 2022-10-27 LAB — COMPREHENSIVE METABOLIC PANEL
ALT: 13 U/L (ref 0–44)
AST: 21 U/L (ref 15–41)
Albumin: 2.9 g/dL — ABNORMAL LOW (ref 3.5–5.0)
Alkaline Phosphatase: 92 U/L (ref 38–126)
Anion gap: 22 — ABNORMAL HIGH (ref 5–15)
BUN: 99 mg/dL — ABNORMAL HIGH (ref 8–23)
CO2: 11 mmol/L — ABNORMAL LOW (ref 22–32)
Calcium: 8 mg/dL — ABNORMAL LOW (ref 8.9–10.3)
Chloride: 101 mmol/L (ref 98–111)
Creatinine, Ser: 7.37 mg/dL — ABNORMAL HIGH (ref 0.61–1.24)
GFR, Estimated: 8 mL/min — ABNORMAL LOW (ref >60)
Glucose, Bld: 382 mg/dL — ABNORMAL HIGH (ref 70–99)
Potassium: 5.1 mmol/L (ref 3.5–5.1)
Sodium: 134 mmol/L — ABNORMAL LOW (ref 135–145)
Total Bilirubin: 1.3 mg/dL — ABNORMAL HIGH (ref 0.3–1.2)
Total Protein: 6.5 g/dL (ref 6.5–8.1)

## 2022-10-27 LAB — LIPASE, BLOOD: Lipase: 20 U/L (ref 11–51)

## 2022-10-27 LAB — SARS CORONAVIRUS 2 BY RT PCR: SARS Coronavirus 2 by RT PCR: NEGATIVE

## 2022-10-27 LAB — BETA-HYDROXYBUTYRIC ACID
Beta-Hydroxybutyric Acid: 5.4 mmol/L — ABNORMAL HIGH (ref 0.05–0.27)
Beta-Hydroxybutyric Acid: 0.32 mmol/L — ABNORMAL HIGH (ref 0.05–0.27)

## 2022-10-27 LAB — BLOOD GAS, VENOUS
Acid-base deficit: 16.7 mmol/L — ABNORMAL HIGH (ref 0.0–2.0)
Bicarbonate: 10.1 mmol/L — ABNORMAL LOW (ref 20.0–28.0)
O2 Saturation: 97.4 %
Patient temperature: 37
pCO2, Ven: 27 mm[Hg] — ABNORMAL LOW (ref 44–60)
pH, Ven: 7.18 — CL (ref 7.25–7.43)
pO2, Ven: 105 mm[Hg] — ABNORMAL HIGH (ref 32–45)

## 2022-10-27 LAB — PROCALCITONIN: Procalcitonin: 1.85 ng/mL

## 2022-10-27 LAB — TROPONIN I (HIGH SENSITIVITY)
Troponin I (High Sensitivity): 28 ng/L — ABNORMAL HIGH (ref <18)
Troponin I (High Sensitivity): 34 ng/L — ABNORMAL HIGH (ref <18)
Troponin I (High Sensitivity): 27 ng/L — ABNORMAL HIGH (ref <18)

## 2022-10-27 LAB — CBG MONITORING, ED
Glucose-Capillary: 354 mg/dL — ABNORMAL HIGH (ref 70–99)
Glucose-Capillary: 357 mg/dL — ABNORMAL HIGH (ref 70–99)
Glucose-Capillary: 376 mg/dL — ABNORMAL HIGH (ref 70–99)
Glucose-Capillary: 358 mg/dL — ABNORMAL HIGH (ref 70–99)
Glucose-Capillary: 285 mg/dL — ABNORMAL HIGH (ref 70–99)
Glucose-Capillary: 277 mg/dL — ABNORMAL HIGH (ref 70–99)
Glucose-Capillary: 231 mg/dL — ABNORMAL HIGH (ref 70–99)

## 2022-10-27 LAB — GLUCOSE, CAPILLARY
Glucose-Capillary: 106 mg/dL — ABNORMAL HIGH (ref 70–99)
Glucose-Capillary: 167 mg/dL — ABNORMAL HIGH (ref 70–99)
Glucose-Capillary: 205 mg/dL — ABNORMAL HIGH (ref 70–99)

## 2022-10-27 LAB — MRSA NEXT GEN BY PCR, NASAL: MRSA by PCR Next Gen: NOT DETECTED

## 2022-10-27 MED ORDER — SODIUM ZIRCONIUM CYCLOSILICATE 10 G PO PACK
10.0000 g | PACK | Freq: Once | ORAL | Status: AC
  Administered 2022-10-27: 10 g via ORAL
  Filled 2022-10-27: qty 1

## 2022-10-27 MED ORDER — NICOTINE 14 MG/24HR TD PT24
14.0000 mg | MEDICATED_PATCH | Freq: Every day | TRANSDERMAL | Status: DC
  Administered 2022-10-28 – 2022-10-31 (×4): 14 mg via TRANSDERMAL
  Filled 2022-10-27 (×5): qty 1

## 2022-10-27 MED ORDER — SODIUM BICARBONATE 8.4 % IV SOLN
50.0000 meq | Freq: Once | INTRAVENOUS | Status: AC
  Administered 2022-10-27: 50 meq via INTRAVENOUS
  Filled 2022-10-27: qty 50

## 2022-10-27 MED ORDER — PRISMASOL BGK 4/2.5 32-4-2.5 MEQ/L EC SOLN
Status: DC
  Filled 2022-10-27 (×4): qty 5000

## 2022-10-27 MED ORDER — HEPARIN SODIUM (PORCINE) 1000 UNIT/ML DIALYSIS
1000.0000 [IU] | INTRAMUSCULAR | Status: DC | PRN
  Administered 2022-10-28 (×2): 1800 [IU] via INTRAVENOUS_CENTRAL
  Filled 2022-10-27 (×3): qty 6

## 2022-10-27 MED ORDER — INSULIN REGULAR(HUMAN) IN NACL 100-0.9 UT/100ML-% IV SOLN
INTRAVENOUS | Status: DC
  Administered 2022-10-27: 8.5 [IU]/h via INTRAVENOUS
  Filled 2022-10-27: qty 100

## 2022-10-27 MED ORDER — DEXTROSE-SODIUM CHLORIDE 5-0.45 % IV SOLN: INTRAVENOUS | Status: DC

## 2022-10-27 MED ORDER — HEPARIN SODIUM (PORCINE) 5000 UNIT/ML IJ SOLN
5000.0000 [IU] | Freq: Three times a day (TID) | INTRAMUSCULAR | Status: DC
  Administered 2022-10-27 – 2022-10-28 (×2): 5000 [IU] via SUBCUTANEOUS
  Filled 2022-10-27 (×2): qty 1

## 2022-10-27 MED ORDER — LACTATED RINGERS IV BOLUS
500.0000 mL | Freq: Once | INTRAVENOUS | Status: AC
  Administered 2022-10-27: 500 mL via INTRAVENOUS

## 2022-10-27 MED ORDER — SODIUM CHLORIDE 0.9 % IV SOLN
INTRAVENOUS | Status: AC
  Administered 2022-10-28: 40 mL/h via INTRAVENOUS

## 2022-10-27 MED ORDER — SODIUM CHLORIDE 0.9% FLUSH
10.0000 mL | Freq: Two times a day (BID) | INTRAVENOUS | Status: DC
  Administered 2022-10-27 – 2022-11-01 (×11): 10 mL via INTRAVENOUS

## 2022-10-27 MED ORDER — PRISMASOL BGK 4/2.5 32-4-2.5 MEQ/L REPLACEMENT SOLN
Status: DC
  Filled 2022-10-27 (×2): qty 5000

## 2022-10-27 MED ORDER — LACTATED RINGERS IV BOLUS: 1000.0000 mL | Freq: Once | INTRAVENOUS | Status: DC

## 2022-10-27 MED ORDER — LACTATED RINGERS IV BOLUS: 20.0000 mL/kg | Freq: Once | INTRAVENOUS | Status: DC

## 2022-10-27 MED ORDER — ENOXAPARIN SODIUM 30 MG/0.3ML IJ SOSY
30.0000 mg | PREFILLED_SYRINGE | INTRAMUSCULAR | Status: DC
  Administered 2022-10-27: 30 mg via SUBCUTANEOUS
  Filled 2022-10-27: qty 0.3

## 2022-10-27 MED ORDER — DEXTROSE 50 % IV SOLN
0.0000 mL | INTRAVENOUS | Status: DC | PRN
  Administered 2022-10-28: 35 mL via INTRAVENOUS
  Filled 2022-10-27: qty 50

## 2022-10-27 NOTE — Progress Notes (Signed)
eLink Physician-Brief Progress Note Patient Name: Shawn [name] DOB: [birth year] MRN: [id number]   Date of Service  10/27/2022  HPI/Events of Note  Patient is a diabetic with ESRD admitted with DKA.  eICU Interventions  New Patient Evaluation.        Thomasene Lot Orian Figueira 10/27/2022, 10:14 PM

## 2022-10-27 NOTE — Assessment & Plan Note (Signed)
BP stable Titrate home regimen 

## 2022-10-27 NOTE — H&P (Signed)
History and Physical    Patient: Shawn [name] IEP:[id number] DOB: [birth year] DOA: 10/27/2022 DOS: the patient was seen and examined on 10/27/2022 [facility]CP: [facility]  Patient coming from: Home  Chief Complaint:  Chief Complaint  Patient presents with   Emesis   Hyperglycemia   HPI: Shawn [name] is a 67 y.o. male with medical history significant of HFrEF, ESRD on hemodialysis Monday Wednesday Friday, substance abuse, history of CVA presenting with DKA, missed hemodialysis.  Patient reports approximate 2 days of intractable nausea and vomiting.  Emesis predominant nonbloody nonbilious.?  Trace blood.  No recent sick contacts.  No recent changes of fluid.  Patient states he stopped taking his insulin roughly 1 week ago.  Per report, patient has been overall fatigued.  Has been relatively compliant with hemodialysis.  Missed dialysis today secondary to lethargy.  No abdominal pain.  Mild diarrhea.  No chest pain or shortness of breath.  No focal hemiparesis or confusion.  Still smoking 1/4 to 1/2 pack/day.  Denies any active alcohol or substance abuse. Presented to the ER afebrile, hemodynamically stable.  White count 7, hemoglobin 9.8, platelets 217, glucose 350s, VBG with a pH of 7.18, bicarb of 10, CO2 27.  Creatinine 7.37.  Chest x-ray pending Review of Systems: As mentioned in the history of present illness. All other systems reviewed and are negative. Past Medical History:  Diagnosis Date   Chronic combined systolic (congestive) and diastolic (congestive) heart failure ([name])    a. TTE 6/15: EF < 20%, mildly dilated LV, DD, mildly dilated LA, mod dilated RA, mild MR, mild to mod TR, mod increased posterior wall thickness, elevated LA and LVEDP; b. 4/12019 Echo: EF 20-25%, diff HK. Gr1 DD, nl RV fxn.   CKD (chronic kidney disease), stage II    Diabetes mellitus with complication ([name])    a. Prior admissions w/ DKA (last 12/2017).   Essential hypertension    NICM  (nonischemic cardiomyopathy) ([name])    a. 06/2013 Echo: EF<20%; b. 06/2013 Cath: no signif dzs; c. 04/2017 Echo: 20-25%, gr1 DD.; d.05/2018 Echo: 25-30%   Polysubstance abuse ([name])    a. etoh and tobacco   Stroke [facility])    Past Surgical History:  Procedure Laterality Date   CENTRAL LINE INSERTION N/A 06/29/2021   Procedure: CENTRAL LINE INSERTION;  Surgeon: Yvonne Kendall, MD;  Location: [facility] INVASIVE CV LAB;  Service: Cardiovascular;  Laterality: N/A;   IR PARACENTESIS  09/09/2022   NO PAST SURGERIES     RIGHT HEART CATH N/A 06/29/2021   Procedure: RIGHT HEART CATH;  Surgeon: Yvonne Kendall, MD;  Location: [facility] INVASIVE CV LAB;  Service: Cardiovascular;  Laterality: N/A;   Social History:  reports that he has been smoking cigarettes. He has never used smokeless tobacco. He reports that he does not currently use alcohol. He reports that he does not use drugs.  Allergies  Allergen Reactions   Empagliflozin     Other Reaction(s): Acidosis   Pravastatin     Other reaction(s): Muscle pain   Simvastatin     Other reaction(s): Muscle pain    Family History  Problem Relation Age of Onset   Congestive Heart Failure Mother    Diabetes Mother    Congestive Heart Failure Brother     Prior to Admission medications   Medication Sig Start Date End Date Taking? Authorizing Provider  acetaminophen (TYLENOL) 325 MG tablet Take 2 tablets (650 mg total) by mouth every 6 (six) hours as needed for  mild pain (or Fever >/= 101). 01/09/21   Dorcas Carrow, MD  ammonium lactate (LAC-HYDRIN) 12 % lotion Apply 1 Application topically as needed for dry skin.    [provider]  aspirin EC 81 MG tablet Take 81 mg by mouth daily.    [provider]  atorvastatin (LIPITOR) 80 MG tablet Take 40 mg by mouth at bedtime.    [provider]  cholecalciferol (VITAMIN D) 25 MCG (1000 UNIT) tablet Take 2,000 Units by mouth daily.    [provider]  dextrose (GLUTOSE) 40 % GEL Take 1  Tube by mouth once as needed for low blood sugar.    [provider]  feeding supplement, GLUCERNA SHAKE, (GLUCERNA SHAKE) LIQD Take 237 mLs by mouth 3 (three) times daily between meals. 01/09/21   Dorcas Carrow, MD  hydrOXYzine (ATARAX) 10 MG tablet Take 10 mg by mouth 4 (four) times daily - after meals and at bedtime.    [provider]  insulin glargine-yfgn (SEMGLEE) 100 UNIT/ML injection Inject 0.05 mLs (5 Units total) into the skin daily at 12 noon. 08/31/22   Enedina Finner, MD  levothyroxine (SYNTHROID) 75 MCG tablet Take 75 mcg by mouth daily before breakfast.    [provider]  Multiple Vitamin (MULTIVITAMIN WITH MINERALS) TABS tablet Take 1 tablet by mouth daily. 01/10/21   Dorcas Carrow, MD  omeprazole (PRILOSEC) 20 MG capsule Take 20 mg by mouth daily.    [provider]  polyethylene glycol (MIRALAX / GLYCOLAX) 17 g packet Take 17 g by mouth daily.    [provider]  tamsulosin (FLOMAX) 0.4 MG CAPS capsule Take 1 capsule (0.4 mg total) by mouth daily after supper. 09/09/22   Sunnie Nielsen, DO  torsemide (DEMADEX) 20 MG tablet Take 60 mg by mouth as directed. Take 3 tablets (60mg ) by mouth every Sunday, Tuesday, Thursday and Saturday    [provider]    Physical Exam: Vitals:   10/27/22 1151 10/27/22 1153  BP: 138/81   Pulse: 71   Resp: 16   Temp: 97.6 F (36.4 C)   TempSrc: Oral   SpO2: 98%   Weight:  65.8 kg  Height:  5\' 11"  (1.803 m)   Physical Exam Constitutional:      Appearance: He is normal weight.  HENT:     Head: Normocephalic and atraumatic.     Nose: Nose normal.     Mouth/Throat:     Mouth: Mucous membranes are moist.  Eyes:     Pupils: Pupils are equal, round, and reactive to light.  Cardiovascular:     Rate and Rhythm: Normal rate and regular rhythm.  Pulmonary:     Effort: Pulmonary effort is normal.  Abdominal:     General: Bowel sounds are normal.  Musculoskeletal:        General: Normal  range of motion.     Right lower leg: Edema present.     Left lower leg: Edema present.  Neurological:     General: No focal deficit present.  Psychiatric:        Mood and Affect: Mood normal.     Data Reviewed:  There are no new results to review at this time.  IR Paracentesis INDICATION: 67 year old male referred for paracentesis  EXAM: ULTRASOUND GUIDED  PARACENTESIS  MEDICATIONS: None.  COMPLICATIONS: None  PROCEDURE: Informed written consent was obtained from the patient after a discussion of the risks, benefits and alternatives to treatment. A timeout was performed prior to  the initiation of the procedure.  Initial ultrasound scanning demonstrates a moderate amount of ascites within the right lower abdominal quadrant. The right lower abdomen was prepped and draped in the usual sterile fashion. 1% lidocaine with epinephrine was used for local anesthesia.  Following this, a 8 Fr Safe-T-Centesis catheter was introduced. An ultrasound image was saved for documentation purposes. The paracentesis was performed. The catheter was removed and a dressing was applied. The patient tolerated the procedure well without immediate post procedural complication.  FINDINGS: A total of approximately 1,450 cc of thin yellow fluid was removed. Samples were sent to the laboratory as requested by the clinical team.  IMPRESSION: Status post image guided paracentesis.  Signed,  Yvone Neu. Miachel Roux, RPVI  [facility]  [facility] Radiology  Electronically Signed   By: Gilmer Mor D.O.   On: 09/09/2022 10:41   Lab Results  Component Value Date   WBC 7.4 10/27/2022   HGB 9.6 (L) 10/27/2022   HCT 30.8 (L) 10/27/2022   MCV 97.5 10/27/2022   PLT 222 10/27/2022   Last metabolic panel Lab Results  Component Value Date   GLUCOSE 382 (H) 10/27/2022   NA 134 (L) 10/27/2022   K 5.1 10/27/2022   CL 101 10/27/2022   CO2 11 (L)  10/27/2022   BUN 99 (H) 10/27/2022   CREATININE 7.37 (H) 10/27/2022   GFRNONAA 8 (L) 10/27/2022   CALCIUM 8.0 (L) 10/27/2022   PHOS 2.6 09/08/2022   PROT 6.5 10/27/2022   ALBUMIN 2.9 (L) 10/27/2022   BILITOT 1.3 (H) 10/27/2022   ALKPHOS 92 10/27/2022   AST 21 10/27/2022   ALT 13 10/27/2022   ANIONGAP 22 (H) 10/27/2022    Assessment and Plan: DKA (diabetic ketoacidosis) ([name]) Blood sugar in 300s w/ bicarb 11 on metabolic panel  Missed insulin x 1 week w/ 2 days of intractable nausea and vomiting  VBG w/ metabolic acidosis (pH 7.18, bicarb 10)  UA and BHB pending  Started on insulin gtt in ER  Minimize IVF in setting of baseline ESRD on HFrEF w/ missed HD today  1-2+ pitting edema in LEs  Monitor per DKA protocol  Follow closely   Chronic HFrEF (heart failure with reduced ejection fraction) ([name]) 2D ECHO 06/2021 w/ EF 25-30% and grade 2 DD + mild volume overload w/ missed HD today  Monitor volume status closely in setting of DKA- minimal IVF  Cont home regimen    ESRD on dialysis ([name]) ESRD w/ HD MWF  Missed HD today  Will consult nephrology   Essential hypertension BP stable  Titrate home regimen    Smoker 1/4-1/2 PPD smoker  Nicotine patch        Advance Care Planning:   Code Status: Full Code   Consults: Nephrology   Family Communication: No family at the bedside   Severity of Illness: The appropriate patient status for this patient is INPATIENT. Inpatient status is judged to be reasonable and necessary in order to provide the required intensity of service to ensure the patient's safety. The patient's presenting symptoms, physical exam findings, and initial radiographic and laboratory data in the context of their chronic comorbidities is felt to place them at high risk for further clinical deterioration. Furthermore, it is not anticipated that the patient will be medically stable for discharge from the hospital within 2 midnights of admission.   * I  certify that at the point of admission it is my clinical judgment that the patient will  require inpatient hospital care spanning beyond 2 midnights from the point of admission due to high intensity of service, high risk for further deterioration and high frequency of surveillance required.*  Author: Floydene Flock, MD 10/27/2022 3:25 PM  For on call review [email or web address].

## 2022-10-27 NOTE — Assessment & Plan Note (Signed)
2D ECHO 06/2021 w/ EF 25-30% and grade 2 DD + mild volume overload w/ missed HD today  Monitor volume status closely in setting of DKA- minimal IVF  Cont home regimen

## 2022-10-27 NOTE — ED Notes (Signed)
Dr.Newton informed of pt critical ABG, no new orders at this time.

## 2022-10-27 NOTE — Assessment & Plan Note (Addendum)
Blood sugar in 300s w/ bicarb 11 on metabolic panel  Missed insulin x 1 week w/ 2 days of intractable nausea and vomiting  VBG w/ metabolic acidosis (pH 7.18, bicarb 10)  UA and BHB pending  Started on insulin gtt in ER  Minimize IVF in setting of baseline ESRD on HFrEF w/ missed HD today  1-2+ pitting edema in LEs  Monitor per DKA protocol  Follow closely

## 2022-10-27 NOTE — Assessment & Plan Note (Signed)
1/4-1/2 PPD smoker  Nicotine patch

## 2022-10-27 NOTE — Progress Notes (Signed)
Patient well known to [place] from prior admissions.  Comes in now with DKA, hyperkalemia, after missing dialysis.  Also has history of heart failure.  Given missed dialysis today and hyperkalemia he is in need of dialysis treatment.  Case discussed with dialysis nurse manager.  Given high volume at [facility], dialysis nurse cannot be sent over this evening.  Therefore we will need to perform CRRT in its place.  Orders to be prepared.  Appreciate critical care assistance.

## 2022-10-27 NOTE — ED Provider Notes (Signed)
[facility] Provider Note    Event Date/Time   First MD Initiated Contact with Patient 10/27/22 1359     (approximate)   History   Emesis and Hyperglycemia   HPI  Shawn [name] is a 67 y.o. male with extensive past medical history including nonischemic cardiomyopathy, diabetes insulin-dependent, heart failure with reduced EF, CKD presents to the ER for evaluation of nausea vomiting abdominal discomfort for the past 2 days.  Has not taken his insulin for 36 hours.  Unable to keep anything down.  Not to be significantly hyperglycemic.     Physical Exam   Triage Vital Signs: ED Triage Vitals  Encounter Vitals Group     BP 10/27/22 1151 138/81     Systolic BP Percentile --      Diastolic BP Percentile --      Pulse Rate 10/27/22 1151 71     Resp 10/27/22 1151 16     Temp 10/27/22 1151 97.6 F (36.4 C)     Temp Source 10/27/22 1151 Oral     SpO2 10/27/22 1151 98 %     Weight 10/27/22 1153 145 lb (65.8 kg)     Height 10/27/22 1153 5\' 11"  (1.803 m)     Head Circumference --      Peak Flow --      Pain Score --      Pain Loc --      Pain Education --      Exclude from Growth Chart --     Most recent vital signs: Vitals:   10/27/22 1151  BP: 138/81  Pulse: 71  Resp: 16  Temp: 97.6 F (36.4 C)  SpO2: 98%     Constitutional: Alert  Eyes: Conjunctivae are normal.  Head: Atraumatic. Nose: No congestion/rhinnorhea. Mouth/Throat: Mucous membranes are moist.   Neck: Painless ROM.  Cardiovascular:   Good peripheral circulation. Respiratory: Normal respiratory effort.  No retractions.  Gastrointestinal: Soft and nontender.  Musculoskeletal:  no deformity Neurologic:  MAE spontaneously. No gross focal neurologic deficits are appreciated.  Skin:  Skin is warm, dry and intact. No rash noted. Psychiatric: Mood and affect are normal. Speech and behavior are normal.    ED Results / Procedures / Treatments   Labs (all labs ordered are  listed, but only abnormal results are displayed) Labs Reviewed  COMPREHENSIVE METABOLIC PANEL - Abnormal; Notable for the following components:      Result Value   Sodium 134 (*)    CO2 11 (*)    Glucose, Bld 382 (*)    BUN 99 (*)    Creatinine, Ser 7.37 (*)    Calcium 8.0 (*)    Albumin 2.9 (*)    Total Bilirubin 1.3 (*)    GFR, Estimated 8 (*)    Anion gap 22 (*)    All other components within normal limits  CBC - Abnormal; Notable for the following components:   RBC 3.25 (*)    Hemoglobin 9.8 (*)    HCT 31.0 (*)    RDW 16.4 (*)    All other components within normal limits  BLOOD GAS, VENOUS - Abnormal; Notable for the following components:   pH, Ven 7.18 (*)    pCO2, Ven 27 (*)    pO2, Ven 105 (*)    Bicarbonate 10.1 (*)    Acid-base deficit 16.7 (*)    All other components within normal limits  CBG MONITORING, ED - Abnormal; Notable for the following components:  Glucose-Capillary 354 (*)    All other components within normal limits  CBG MONITORING, ED - Abnormal; Notable for the following components:   Glucose-Capillary 357 (*)    All other components within normal limits  LIPASE, BLOOD  BETA-HYDROXYBUTYRIC ACID  BETA-HYDROXYBUTYRIC ACID  CBC WITH DIFFERENTIAL/PLATELET  URINALYSIS, ROUTINE W REFLEX MICROSCOPIC  BASIC METABOLIC PANEL  BASIC METABOLIC PANEL  BASIC METABOLIC PANEL  BASIC METABOLIC PANEL  BETA-HYDROXYBUTYRIC ACID  CBC WITH DIFFERENTIAL/PLATELET     EKG  ED ECG REPORT I, Willy Eddy, the attending physician, personally viewed and interpreted this ECG.   Date: 10/27/2022  EKG Time: 70  Rate: sinus  Rhythm:  sinus  Axis: normal  Intervals: normal  ST&T Change: non specific st abn    RADIOLOGY Please see ED Course for my review and interpretation.  I personally reviewed all radiographic images ordered to evaluate for the above acute complaints and reviewed radiology reports and findings.  These findings were personally discussed  with the patient.  Please see medical record for radiology report.    PROCEDURES:  Critical Care performed: Yes, see critical care procedure note(s)  .Critical Care  Performed by: Willy Eddy, MD Authorized by: Willy Eddy, MD   Critical care provider statement:    Critical care time (minutes):  35   Critical care was necessary to treat or prevent imminent or life-threatening deterioration of the following conditions:  Endocrine crisis   Critical care was time spent personally by me on the following activities:  Ordering and performing treatments and interventions, ordering and review of laboratory studies, ordering and review of radiographic studies, pulse oximetry, re-evaluation of patient's condition, review of old charts, obtaining history from patient or surrogate, examination of patient, evaluation of patient's response to treatment, discussions with primary provider, discussions with consultants and development of treatment plan with patient or surrogate    MEDICATIONS ORDERED IN ED: Medications  insulin regular, human (MYXREDLIN) 100 units/ 100 mL infusion (8.5 Units/hr Intravenous New Bag/Given 10/27/22 1510)  dextrose 50 % solution 0-50 mL (has no administration in time range)  sodium chloride flush (NS) 0.9 % injection 10 mL (has no administration in time range)  enoxaparin (LOVENOX) injection 30 mg (has no administration in time range)  nicotine (NICODERM CQ - dosed in mg/24 hours) patch 14 mg (has no administration in time range)  lactated ringers bolus 500 mL (0 mLs Intravenous Stopped 10/27/22 1500)     IMPRESSION / MDM / ASSESSMENT AND PLAN / ED COURSE  I reviewed the triage vital signs and the nursing notes.                              Differential diagnosis includes, but is not limited to, DKA, HHS, electrolyte abnormality, sepsis, dehydration  Patient presenting to the ER for evaluation of symptoms as described above.  Based on symptoms, risk  factors and considered above differential, this presenting complaint could reflect a potentially life-threatening illness therefore the patient will be placed on continuous pulse oximetry and telemetry for monitoring.  Laboratory evaluation will be sent to evaluate for the above complaints.  Blood work shows evidence of acute DKA in setting of chronic renal insufficiency.  Patient will be given IV fluids but judiciously so given his history of nonischemic cardiomyopathy and reduced EF.  Patient will be placed on insulin drip.  Suspect this occurred in the setting of GI illness.  Does not have any significant abdominal tenderness  on exam.  Will consult hospitalist for admission.       FINAL CLINICAL IMPRESSION(S) / ED DIAGNOSES   Final diagnoses:  Type 1 diabetes mellitus with ketoacidosis without coma ([name])     Rx / DC Orders   ED Discharge Orders     None        Note:  This document was prepared using Dragon voice recognition software and may include unintentional dictation errors.    Willy Eddy, MD 10/27/22 [phone]

## 2022-10-27 NOTE — Consult Note (Signed)
NAME:  Shawn [name], MRN:  [id number], DOB:  [birth year], LOS: 0 ADMISSION DATE:  10/27/2022, CONSULTATION DATE:  10/27/2022 REFERRING MD:  Dr. Alvester Morin, CHIEF COMPLAINT:  Severe DKA   Brief Pt Description / Synopsis:  67 y.o. male with PMHx significant for ESRD on HD, HFrEF, CVA, and substance abuse who is admitted with severe DKA, likely in setting of medication noncompliance.  History of Present Illness:  Shawn [name] is a 67 y.o. male with past medical history significant of HFrEF, ESRD on hemodialysis (Monday Wednesday Friday), substance abuse, and CVA who presented to [facility] ED on 10/27/22 due to complaints of intractable nausea and vomiting.  Patient reports nausea and vomiting began about 2 days ago, and describes it as non bloody and non bilious.  He reports he stopped taking his insulin about 1 week ago.  He also reports associated fatigue and mild diarrhea.  He denies chest pain, shortness of breath, cough, sputum production, fever, chills, dysuria,  sick contacts, or any alcohol or illicit substance abuse.  He reports he has been compliant with outpatient dialysis until today, of which he missed due to lethargy and malaise.  ED Course: Initial Vital Signs: afebrile, hemodynamically stable  Significant Labs: White count 7.4, hemoglobin 9.8, platelets 217, glucose 382, Na 134, Potassium 5.1, BUN 99, Cr. 7.37, beta-hydroxybutyric acid 5.4 VBG with a pH of 7.18, bicarb of 10, CO2 27.   UA and UDS currently pending Imaging Chest X-ray>>FINDINGS: Stable right IJ hemodialysis catheter to the proximal right atrium. Resolution of the interstitial edema seen previously. Left retrocardiac consolidation/atelectasis. Medications Administered: 500 cc LR bolus, insulin gtt initiated  [facility] asked to admit for further workup and treatment.  Nephrology consulted for Hemodialysis.  Currently there is not enough staffing for HD, therefore plan is to start CRRT.  PCCM asked to consult to  assist with management while on CRRT.  Please see "Significant Hospital Events" section below for full detailed hospital course.   Pertinent  Medical History   Past Medical History:  Diagnosis Date   Chronic combined systolic (congestive) and diastolic (congestive) heart failure ([name])    a. TTE 6/15: EF < 20%, mildly dilated LV, DD, mildly dilated LA, mod dilated RA, mild MR, mild to mod TR, mod increased posterior wall thickness, elevated LA and LVEDP; b. 4/12019 Echo: EF 20-25%, diff HK. Gr1 DD, nl RV fxn.   CKD (chronic kidney disease), stage II    Diabetes mellitus with complication ([name])    a. Prior admissions w/ DKA (last 12/2017).   Essential hypertension    NICM (nonischemic cardiomyopathy) ([name])    a. 06/2013 Echo: EF<20%; b. 06/2013 Cath: no signif dzs; c. 04/2017 Echo: 20-25%, gr1 DD.; d.05/2018 Echo: 25-30%   Polysubstance abuse ([name])    a. etoh and tobacco   Stroke [facility])     Micro Data:  10/23: COVID-19 PCR>>  Antimicrobials:   Anti-infectives (From admission, onward)    None       Significant Hospital Events: Including procedures, antibiotic start and stop dates in addition to other pertinent events   10/27/22: Presented to ED and found to have DKA.  [facility] asked to admit, Nephrology consulted.  Unable to undergo HD due to staffing, plan to start CRRT.  PCCM consulted while pt to receive CRRT.  Interim History / Subjective:  -Pt seen in ED, sleeping comfortably -Hemodynamically stable, on room air -Reports hasn't taken his insulin for about a week since he wasn't feeling well  Objective  Blood pressure 128/71, pulse 75, temperature 97.6 F (36.4 C), temperature source Oral, resp. rate 16, height 5\' 11"  (1.803 m), weight 65.8 kg, SpO2 99%.        Intake/Output Summary (Last 24 hours) at 10/27/2022 1756 Last data filed at 10/27/2022 1706 Gross per 24 hour  Intake 516.16 ml  Output --  Net 516.16 ml   Filed Weights   10/27/22 1153  Weight: 65.8 kg     Examination: General: Acute on chronically ill appearing male, laying in bed, on room air, in NAD HENT: Atraumatic, normocephalic, neck supple, no JVD, dry M/M Lungs: Clear diminished throughout upon posterior auscultation, even, nonlabored, normal effort Cardiovascular: RRR, s1s2, no M/R/G Abdomen: Soft, nontender, nondistended, no guarding or rebound tenderness, BS +x4 Extremities: RLE swollen > LLE (pt reports this is not acute), no deformities Neuro: Sleeping, arouses to voice, oriented x3,  moves all extremities to commands, no focal deficits GU: Deferred  Resolved Hospital Problem list     Assessment & Plan:   #DKA, likely due to medication noncompliance (hasn't taken insulin x1 week) -Follow DKA Protocol -Gentle IV fluids given ESRD and high risk for development of volume overload -Insulin drip -Follow BMP q4h & Beta-Hydroxybutyric acid q8h -Once Anion Gap is closed and serum Bicarb >20, can convert to SSI & Basal insulin -Diabetes Coordinator consulted, appreciate input -Check Hgb A1c -UA is pending, No obvious source of infection ~ will check for COVID, UDS, and troponin  #ESRD on Hemodialysis (M,W,F) #Hyperkalemia #Hyponatremia #AG Metabolic Acidosis -Monitor I&O's / urinary output -Follow BMP -Ensure adequate renal perfusion -Avoid nephrotoxic agents as able -Replace electrolytes as indicated ~ Pharmacy following for assistance with electrolyte replacement -Nephrology consulted, appreciate input ~ unable to get HD today due to staffing issues, plan to start CRRT -Will give shifting measures in the interim: Bicarb, Lokelma, insulin  #Chronic HFrEF PMHx: HTN Echocardiogram 06/26/21: LVEF 25-30%, grade II DD, RV systolic function is normal, moderate MR -Continuous cardiac monitoring -Maintain MAP >65 -Cautious IV fluids -Vasopressors as needed to maintain MAP goal ~ not requiring -Trend HS Troponin until peaked -Volume removal with HD/CRRT    Best  Practice (right click and "Reselect all SmartList Selections" daily)   Diet/type: Regular consistency (see orders) DVT prophylaxis: prophylactic heparin  GI prophylaxis: N/A Lines: N/A Foley:  N/A Code Status:  full code Last date of multidisciplinary goals of care discussion [N/A]  10/23: Pt updated at bedside on plan of care.  Labs   CBC: Recent Labs  Lab 10/24/22 0708 10/27/22 1155 10/27/22 1525  WBC 2.9* 7.0 7.4  NEUTROABS  --   --  6.3  HGB 11.5* 9.8* 9.6*  HCT 36.9* 31.0* 30.8*  MCV 95.1 95.4 97.5  PLT 186 217 222    Basic Metabolic Panel: Recent Labs  Lab 10/24/22 0708 10/27/22 1155 10/27/22 1429  NA 133* 134* 131*  K 4.1 5.1 6.1*  CL 99 101 97*  CO2 21* 11* 10*  GLUCOSE 80 382* 415*  BUN 55* 99* 100*  CREATININE 5.76* 7.37* 7.57*  CALCIUM 8.4* 8.0* 8.2*   GFR: Estimated Creatinine Clearance: 8.9 mL/min (A) (by C-G formula based on SCr of 7.57 mg/dL (H)). Recent Labs  Lab 10/24/22 0708 10/27/22 1155 10/27/22 1525  WBC 2.9* 7.0 7.4    Liver Function Tests: Recent Labs  Lab 10/27/22 1155  AST 21  ALT 13  ALKPHOS 92  BILITOT 1.3*  PROT 6.5  ALBUMIN 2.9*   Recent Labs  Lab 10/27/22 1155  LIPASE 20   No results for input(s): "AMMONIA" in the last 168 hours.  ABG    Component Value Date/Time   PHART 7.42 08/29/2022 0613   PCO2ART 39 08/29/2022 0613   PO2ART 37 (LL) 08/29/2022 0613   HCO3 10.1 (L) 10/27/2022 1411   ACIDBASEDEF 16.7 (H) 10/27/2022 1411   O2SAT 97.4 10/27/2022 1411     Coagulation Profile: No results for input(s): "INR", "PROTIME" in the last 168 hours.  Cardiac Enzymes: No results for input(s): "CKTOTAL", "CKMB", "CKMBINDEX", "TROPONINI" in the last 168 hours.  HbA1C: Hemoglobin A1C  Date/Time Value Ref Range Status  06/18/2013 04:27 AM 11.9 (H) 4.2 - 6.3 % Final    Comment:    The American Diabetes Association recommends that a primary goal of therapy should be <7% and that physicians should reevaluate  the treatment regimen in patients with HbA1c values consistently >8%.    Hgb A1c MFr Bld  Date/Time Value Ref Range Status  08/29/2022 02:46 PM 11.0 (H) 4.8 - 5.6 % Final    Comment:    (NOTE)         Prediabetes: 5.7 - 6.4         Diabetes: >6.4         Glycemic control for adults with diabetes: <7.0   06/24/2021 05:26 AM 11.9 (H) 4.8 - 5.6 % Final    Comment:    (NOTE) Pre diabetes:          5.7%-6.4%  Diabetes:              >6.4%  Glycemic control for   <7.0% adults with diabetes     CBG: Recent Labs  Lab 10/24/22 0658 10/27/22 1154 10/27/22 1506 10/27/22 1615 10/27/22 1703  GLUCAP 87 354* 357* 376* 358*    Review of Systems:   Positives in BOLD: Gen: Denies fever, chills, weight change, fatigue, night sweats HEENT: Denies blurred vision, double vision, hearing loss, tinnitus, sinus congestion, rhinorrhea, sore throat, neck stiffness, dysphagia PULM: Denies shortness of breath, cough, sputum production, hemoptysis, wheezing CV: Denies chest pain, edema, orthopnea, paroxysmal nocturnal dyspnea, palpitations GI: Denies abdominal pain, nausea, vomiting, diarrhea, hematochezia, melena, constipation, change in bowel habits GU: Denies dysuria, hematuria, polyuria, oliguria, urethral discharge Endocrine: Denies hot or cold intolerance, polyuria, polyphagia or appetite change Derm: Denies rash, dry skin, scaling or peeling skin change Heme: Denies easy bruising, bleeding, bleeding gums Neuro: Denies headache, numbness, weakness, slurred speech, loss of memory or consciousness   Past Medical History:  He,  has a past medical history of Chronic combined systolic (congestive) and diastolic (congestive) heart failure ([name]), CKD (chronic kidney disease), stage II, Diabetes mellitus with complication ([name]), Essential hypertension, NICM (nonischemic cardiomyopathy) ([name]), Polysubstance abuse ([name]), and Stroke ([name]).   Surgical History:   Past Surgical History:  Procedure  Laterality Date   CENTRAL LINE INSERTION N/A 06/29/2021   Procedure: CENTRAL LINE INSERTION;  Surgeon: Yvonne Kendall, MD;  Location: [facility] INVASIVE CV LAB;  Service: Cardiovascular;  Laterality: N/A;   IR PARACENTESIS  09/09/2022   NO PAST SURGERIES     RIGHT HEART CATH N/A 06/29/2021   Procedure: RIGHT HEART CATH;  Surgeon: Yvonne Kendall, MD;  Location: [facility] INVASIVE CV LAB;  Service: Cardiovascular;  Laterality: N/A;     Social History:   reports that he has been smoking cigarettes. He has never used smokeless tobacco. He reports that he does not currently use alcohol. He reports that he does not use drugs.   Family History:  His family history includes Congestive Heart Failure in his brother and mother; Diabetes in his mother.   Allergies Allergies  Allergen Reactions   Empagliflozin     Other Reaction(s): Acidosis   Pravastatin     Other reaction(s): Muscle pain   Simvastatin     Other reaction(s): Muscle pain     Home Medications  Prior to Admission medications   Medication Sig Start Date End Date Taking? Authorizing Provider  acetaminophen (TYLENOL) 325 MG tablet Take 2 tablets (650 mg total) by mouth every 6 (six) hours as needed for mild pain (or Fever >/= 101). 01/09/21   Dorcas Carrow, MD  ammonium lactate (LAC-HYDRIN) 12 % lotion Apply 1 Application topically as needed for dry skin.    [provider]  aspirin EC 81 MG tablet Take 81 mg by mouth daily.    [provider]  atorvastatin (LIPITOR) 80 MG tablet Take 40 mg by mouth at bedtime.    [provider]  cholecalciferol (VITAMIN D) 25 MCG (1000 UNIT) tablet Take 2,000 Units by mouth daily.    [provider]  dextrose (GLUTOSE) 40 % GEL Take 1 Tube by mouth once as needed for low blood sugar.    [provider]  feeding supplement, GLUCERNA SHAKE, (GLUCERNA SHAKE) LIQD Take 237 mLs by mouth 3 (three) times daily between meals. 01/09/21   Dorcas Carrow, MD  hydrOXYzine  (ATARAX) 10 MG tablet Take 10 mg by mouth 4 (four) times daily - after meals and at bedtime.    [provider]  insulin glargine-yfgn (SEMGLEE) 100 UNIT/ML injection Inject 0.05 mLs (5 Units total) into the skin daily at 12 noon. 08/31/22   Enedina Finner, MD  levothyroxine (SYNTHROID) 75 MCG tablet Take 75 mcg by mouth daily before breakfast.    [provider]  Multiple Vitamin (MULTIVITAMIN WITH MINERALS) TABS tablet Take 1 tablet by mouth daily. 01/10/21   Dorcas Carrow, MD  omeprazole (PRILOSEC) 20 MG capsule Take 20 mg by mouth daily.    [provider]  polyethylene glycol (MIRALAX / GLYCOLAX) 17 g packet Take 17 g by mouth daily.    [provider]  tamsulosin (FLOMAX) 0.4 MG CAPS capsule Take 1 capsule (0.4 mg total) by mouth daily after supper. 09/09/22   Sunnie Nielsen, DO  torsemide (DEMADEX) 20 MG tablet Take 60 mg by mouth as directed. Take 3 tablets (60mg ) by mouth every Sunday, Tuesday, Thursday and Saturday    [provider]     Care time: 50 minutes     Harlon Ditty, AGACNP-BC [facility] Prefer epic messenger for cross cover needs If after hours, please call E-link

## 2022-10-27 NOTE — ED Triage Notes (Signed)
Pt brought via EMS from home d/t n/v and abd pain for the past 2 days. EMS had CBG of 436. Pt A&O and non ambulatory but can stand and get into a wheel chair

## 2022-10-27 NOTE — Assessment & Plan Note (Signed)
ESRD w/ HD MWF  Missed HD today  Will consult nephrology

## 2022-10-27 NOTE — Progress Notes (Signed)
PHARMACIST - PHYSICIAN COMMUNICATION  CONCERNING:  Enoxaparin (Lovenox) for DVT Prophylaxis    RECOMMENDATION: Patient was prescribed enoxaprin 40mg  q24 hours for VTE prophylaxis.   Filed Weights   10/27/22 1153  Weight: 65.8 kg (145 lb)    Body mass index is 20.22 kg/m.  Estimated Creatinine Clearance: 9.2 mL/min (A) (by C-G formula based on SCr of 7.37 mg/dL (H)).  Patient is candidate for enoxaparin 30mg  every 24 hours based on CrCl <69ml/min or Weight <45kg  DESCRIPTION: Pharmacy has adjusted enoxaparin dose per [facility] policy.  Patient is now receiving enoxaparin 30 mg every 24 hours    Rockwell Alexandria, PharmD Clinical Pharmacist  10/27/2022 2:55 PM

## 2022-10-28 DIAGNOSIS — E1110 Type 2 diabetes mellitus with ketoacidosis without coma: Secondary | ICD-10-CM | POA: Diagnosis not present

## 2022-10-28 LAB — GLUCOSE, CAPILLARY
Glucose-Capillary: 105 mg/dL — ABNORMAL HIGH (ref 70–99)
Glucose-Capillary: 127 mg/dL — ABNORMAL HIGH (ref 70–99)
Glucose-Capillary: 67 mg/dL — ABNORMAL LOW (ref 70–99)
Glucose-Capillary: 77 mg/dL (ref 70–99)
Glucose-Capillary: 78 mg/dL (ref 70–99)
Glucose-Capillary: 78 mg/dL (ref 70–99)
Glucose-Capillary: 81 mg/dL (ref 70–99)
Glucose-Capillary: 84 mg/dL (ref 70–99)
Glucose-Capillary: 91 mg/dL (ref 70–99)
Glucose-Capillary: 92 mg/dL (ref 70–99)
Glucose-Capillary: 93 mg/dL (ref 70–99)
Glucose-Capillary: 93 mg/dL (ref 70–99)

## 2022-10-28 LAB — RENAL FUNCTION PANEL
Albumin: 2.7 g/dL — ABNORMAL LOW (ref 3.5–5.0)
Anion gap: 12 (ref 5–15)
BUN: 91 mg/dL — ABNORMAL HIGH (ref 8–23)
CO2: 20 mmol/L — ABNORMAL LOW (ref 22–32)
Calcium: 7.6 mg/dL — ABNORMAL LOW (ref 8.9–10.3)
Chloride: 103 mmol/L (ref 98–111)
Creatinine, Ser: 6.13 mg/dL — ABNORMAL HIGH (ref 0.61–1.24)
GFR, Estimated: 9 mL/min — ABNORMAL LOW (ref >60)
Glucose, Bld: 75 mg/dL (ref 70–99)
Phosphorus: 5.8 mg/dL — ABNORMAL HIGH (ref 2.5–4.6)
Potassium: 4.7 mmol/L (ref 3.5–5.1)
Sodium: 135 mmol/L (ref 135–145)

## 2022-10-28 LAB — BASIC METABOLIC PANEL
Anion gap: 12 (ref 5–15)
BUN: 81 mg/dL — ABNORMAL HIGH (ref 8–23)
CO2: 21 mmol/L — ABNORMAL LOW (ref 22–32)
Calcium: 7.7 mg/dL — ABNORMAL LOW (ref 8.9–10.3)
Chloride: 101 mmol/L (ref 98–111)
Creatinine, Ser: 5.4 mg/dL — ABNORMAL HIGH (ref 0.61–1.24)
GFR, Estimated: 11 mL/min — ABNORMAL LOW (ref >60)
Glucose, Bld: 102 mg/dL — ABNORMAL HIGH (ref 70–99)
Potassium: 4.7 mmol/L (ref 3.5–5.1)
Sodium: 134 mmol/L — ABNORMAL LOW (ref 135–145)

## 2022-10-28 LAB — CBC
HCT: 28.1 % — ABNORMAL LOW (ref 39.0–52.0)
Hemoglobin: 9.3 g/dL — ABNORMAL LOW (ref 13.0–17.0)
MCH: 30.3 pg (ref 26.0–34.0)
MCHC: 33.1 g/dL (ref 30.0–36.0)
MCV: 91.5 fL (ref 80.0–100.0)
Platelets: 194 10*3/uL (ref 150–400)
RBC: 3.07 MIL/uL — ABNORMAL LOW (ref 4.22–5.81)
RDW: 15.9 % — ABNORMAL HIGH (ref 11.5–15.5)
WBC: 5.5 10*3/uL (ref 4.0–10.5)
nRBC: 0 % (ref 0.0–0.2)

## 2022-10-28 LAB — BETA-HYDROXYBUTYRIC ACID: Beta-Hydroxybutyric Acid: 0.26 mmol/L (ref 0.05–0.27)

## 2022-10-28 LAB — TROPONIN I (HIGH SENSITIVITY)
Troponin I (High Sensitivity): 26 ng/L — ABNORMAL HIGH (ref <18)
Troponin I (High Sensitivity): 28 ng/L — ABNORMAL HIGH (ref <18)

## 2022-10-28 LAB — MAGNESIUM: Magnesium: 2.2 mg/dL (ref 1.7–2.4)

## 2022-10-28 MED ORDER — ATORVASTATIN CALCIUM 20 MG PO TABS
40.0000 mg | ORAL_TABLET | Freq: Every day | ORAL | Status: DC
  Administered 2022-10-28 – 2022-10-29 (×2): 40 mg via ORAL
  Filled 2022-10-28 (×2): qty 2

## 2022-10-28 MED ORDER — ALTEPLASE 2 MG IJ SOLR: 2.0000 mg | Freq: Once | INTRAMUSCULAR | Status: DC | PRN

## 2022-10-28 MED ORDER — RENA-VITE PO TABS
1.0000 | ORAL_TABLET | Freq: Every day | ORAL | Status: DC
  Administered 2022-10-28 – 2022-10-30 (×3): 1
  Filled 2022-10-28 (×3): qty 1

## 2022-10-28 MED ORDER — LEVOTHYROXINE SODIUM 50 MCG PO TABS
75.0000 ug | ORAL_TABLET | Freq: Every day | ORAL | Status: DC
  Administered 2022-10-29: 75 ug via ORAL
  Filled 2022-10-28: qty 1

## 2022-10-28 MED ORDER — PANTOPRAZOLE SODIUM 40 MG IV SOLR
40.0000 mg | Freq: Two times a day (BID) | INTRAVENOUS | Status: DC
  Administered 2022-10-28 – 2022-11-01 (×9): 40 mg via INTRAVENOUS
  Filled 2022-10-28 (×9): qty 10

## 2022-10-28 MED ORDER — FUROSEMIDE 10 MG/ML IJ SOLN: 20.0000 mg | Freq: Once | INTRAMUSCULAR | Status: DC

## 2022-10-28 MED ORDER — ENSURE ENLIVE PO LIQD
237.0000 mL | Freq: Three times a day (TID) | ORAL | Status: DC
  Administered 2022-10-28 – 2022-11-01 (×5): 237 mL via ORAL

## 2022-10-28 MED ORDER — HEPARIN SODIUM (PORCINE) 1000 UNIT/ML DIALYSIS
25.0000 [IU]/kg | INTRAMUSCULAR | Status: DC | PRN
Start: 2022-10-28 — End: 2022-10-29
  Administered 2022-10-29: 1600 [IU] via INTRAVENOUS_CENTRAL
  Filled 2022-10-28: qty 2

## 2022-10-28 MED ORDER — ONDANSETRON HCL 4 MG/2ML IJ SOLN
4.0000 mg | Freq: Once | INTRAMUSCULAR | Status: AC
  Administered 2022-10-28: 4 mg via INTRAVENOUS
  Filled 2022-10-28: qty 2

## 2022-10-28 MED ORDER — INSULIN ASPART 100 UNIT/ML IJ SOLN: 0.0000 [IU] | Freq: Every day | INTRAMUSCULAR | Status: DC

## 2022-10-28 MED ORDER — INSULIN GLARGINE-YFGN 100 UNIT/ML ~~LOC~~ SOLN
5.0000 [IU] | Freq: Every day | SUBCUTANEOUS | Status: DC
  Administered 2022-10-28 – 2022-11-01 (×4): 5 [IU] via SUBCUTANEOUS
  Filled 2022-10-28 (×5): qty 0.05

## 2022-10-28 MED ORDER — CHLORHEXIDINE GLUCONATE CLOTH 2 % EX PADS
6.0000 | MEDICATED_PAD | Freq: Every day | CUTANEOUS | Status: DC
Start: 2022-10-28 — End: 2022-11-01
  Administered 2022-10-28 – 2022-11-01 (×5): 6 via TOPICAL

## 2022-10-28 MED ORDER — HEPARIN SODIUM (PORCINE) 1000 UNIT/ML DIALYSIS: 1000.0000 [IU] | INTRAMUSCULAR | Status: DC | PRN

## 2022-10-28 MED ORDER — INSULIN ASPART 100 UNIT/ML IJ SOLN: 0.0000 [IU] | Freq: Three times a day (TID) | INTRAMUSCULAR | Status: DC

## 2022-10-28 MED ORDER — NEPRO/CARBSTEADY PO LIQD
237.0000 mL | Freq: Two times a day (BID) | ORAL | Status: DC
  Administered 2022-10-28: 237 mL via ORAL

## 2022-10-28 MED ORDER — ASPIRIN 81 MG PO TBEC: 81.0000 mg | DELAYED_RELEASE_TABLET | Freq: Every day | ORAL | Status: DC

## 2022-10-28 NOTE — Progress Notes (Signed)
PROGRESS NOTE    Shawn [name]  ZOX:[id number] DOB: July 27, 1955 DOA: 10/27/2022 PCP: [facility]     Brief Narrative:    Shawn [name] is a 67 y.o. male with medical history significant of HFrEF, ESRD on hemodialysis Monday Wednesday Friday, substance abuse, history of CVA presenting with DKA, missed hemodialysis.  Patient reports approximate 2 days of intractable nausea and vomiting.  Emesis predominant nonbloody nonbilious.?  Trace blood.  No recent sick contacts.  No recent changes of fluid.  Patient states he stopped taking his insulin roughly 1 week ago.  Per report, patient has been overall fatigued.  Has been relatively compliant with hemodialysis.  Missed dialysis today secondary to lethargy.  No abdominal pain.  Mild diarrhea.  No chest pain or shortness of breath.  No focal hemiparesis or confusion.  Still smoking 1/4 to 1/2 pack/day.  Denies any active alcohol or substance abuse    Assessment & Plan:   Active Problems:   Chronic HFrEF (heart failure with reduced ejection fraction) ([name])   ESRD on dialysis ([name])   T2DM (type 2 diabetes mellitus) ([name])   Essential hypertension   Smoker   Alcohol abuse   Diabetic ketoacidosis without coma associated with type 2 diabetes mellitus ([name])  # DKA Presented with hyperglycemia, elevated gap, elevated beta-h. Placed on judicious fluids and insulin gtt. This appears to be 2/2 noncompliance as hasn't taken insulin in a week.. DKA now resolved - transition to basal/bolus (semglee 5 and ssi sensitive) - DM educator following - f/u ua, will also check blood cultures  # Debility - PT consult  # Hyperkalemia 2/2 missed dialysis. Resolved - monitor  # Nausea/vomiting # Loose stool Nausea/vomiting likely 2/2 dka as has resolved with resolution of dka. Still with loose stool however - c diff and gi pathogen panel ordered  # Hematemesis? Sister says she noted some streaks of blood in emesis. No hematemesis  here and hgb stable from priors (9s) so relatively low concern for ongoing bleeding, suspect something like MW tear. - will start bid ppi - will monitor closely, low threshold to involve gi  # Retrocardiac opacity On cxr. No fever or cough or leukocytosis to suggest pneumonia. Covid negative - will monitor off abx  # HFrEF EF last year 25-30. Hasn't seen cardiology for several years. Appears volume primarily managed with dialysis - will hold home torsemide (60 on non-dialysis days) given need for fluid resuscitation from dka  # History CVA - cont home statin - hold asa 2/2 above bleeding concern  # Hypothyroid - home synthroid  # BPH - home flomax  DVT prophylaxis: scds Code Status: full Family Communication: sister ameita updated telephonically 10/24, he lives with another sister  Level of care: Telemetry Medical Status is: Inpatient Remains inpatient appropriate because: severity of illness    Consultants:  pccm  Procedures: crrt  Antimicrobials:  none    Subjective: Reports feeling ok this morning, no pain, no vomiting, had loose bm earlier not bloody  Objective: Vitals:   10/28/22 0615 10/28/22 0630 10/28/22 0645 10/28/22 0700  BP: 103/66 104/71 101/72 (!) 88/68  Pulse: 70  66   Resp: 16 15 16 14   Temp:      TempSrc:      SpO2: 98%  96%   Weight:      Height:        Intake/Output Summary (Last 24 hours) at 10/28/2022 0818 Last data filed at 10/28/2022 0700 Gross per 24 hour  Intake 1021.66 ml  Output 307 ml  Net 714.66 ml   Filed Weights   10/27/22 1153  Weight: 65.8 kg    Examination:  General exam: Appears calm and comfortable, chronically ill appearing Respiratory system: Clear to auscultation save for rales at bases, normal sob Cardiovascular system: S1 & S2 heard, RRR. Distant heart sounds, soft systolic murmur Gastrointestinal system: Abdomen is nondistended, soft and nontender.   Central nervous system: Alert and oriented. No  focal neurological deficits. Extremities: warm, trace edema Skin: No visible rashes, lesions or ulcers Psychiatry: appears somewhat demented    Data Reviewed: I have personally reviewed following labs and imaging studies  CBC: Recent Labs  Lab 10/24/22 0708 10/27/22 1155 10/27/22 1525 10/28/22 0558  WBC 2.9* 7.0 7.4 5.5  NEUTROABS  --   --  6.3  --   HGB 11.5* 9.8* 9.6* 9.3*  HCT 36.9* 31.0* 30.8* 28.1*  MCV 95.1 95.4 97.5 91.5  PLT 186 217 222 194   Basic Metabolic Panel: Recent Labs  Lab 10/27/22 1429 10/27/22 1837 10/27/22 2218 10/28/22 0305 10/28/22 0558  NA 131* 134* 136 135 134*  K 6.1* 4.7 4.7 4.7 4.7  CL 97* 100 100 103 101  CO2 10* 14* 16* 20* 21*  GLUCOSE 415* 298* 167* 75 102*  BUN 100* 100* 100* 91* 81*  CREATININE 7.57* 7.63* 7.72* 6.13* 5.40*  CALCIUM 8.2* 8.1* 8.2* 7.6* 7.7*  MG  --   --   --   --  2.2  PHOS  --   --   --  5.8*  --    GFR: Estimated Creatinine Clearance: 12.5 mL/min (A) (by C-G formula based on SCr of 5.4 mg/dL (H)). Liver Function Tests: Recent Labs  Lab 10/27/22 1155 10/28/22 0305  AST 21  --   ALT 13  --   ALKPHOS 92  --   BILITOT 1.3*  --   PROT 6.5  --   ALBUMIN 2.9* 2.7*   Recent Labs  Lab 10/27/22 1155  LIPASE 20   No results for input(s): "AMMONIA" in the last 168 hours. Coagulation Profile: No results for input(s): "INR", "PROTIME" in the last 168 hours. Cardiac Enzymes: No results for input(s): "CKTOTAL", "CKMB", "CKMBINDEX", "TROPONINI" in the last 168 hours. BNP (last 3 results) No results for input(s): "PROBNP" in the last 8760 hours. HbA1C: No results for input(s): "HGBA1C" in the last 72 hours. CBG: Recent Labs  Lab 10/28/22 0314 10/28/22 0412 10/28/22 0435 10/28/22 0540 10/28/22 0646  GLUCAP 81 67* 127* 91 93   Lipid Profile: No results for input(s): "CHOL", "HDL", "LDLCALC", "TRIG", "CHOLHDL", "LDLDIRECT" in the last 72 hours. Thyroid Function Tests: No results for input(s): "TSH",  "T4TOTAL", "FREET4", "T3FREE", "THYROIDAB" in the last 72 hours. Anemia Panel: No results for input(s): "VITAMINB12", "FOLATE", "FERRITIN", "TIBC", "IRON", "RETICCTPCT" in the last 72 hours. Urine analysis:    Component Value Date/Time   COLORURINE YELLOW (A) 09/03/2022 0543   APPEARANCEUR CLOUDY (A) 09/03/2022 0543   LABSPEC 1.019 09/03/2022 0543   PHURINE 5.0 09/03/2022 0543   GLUCOSEU 150 (A) 09/03/2022 0543   HGBUR LARGE (A) 09/03/2022 0543   BILIRUBINUR NEGATIVE 09/03/2022 0543   KETONESUR NEGATIVE 09/03/2022 0543   PROTEINUR >=300 (A) 09/03/2022 0543   NITRITE NEGATIVE 09/03/2022 0543   LEUKOCYTESUR MODERATE (A) 09/03/2022 0543   Sepsis Labs: @LABRCNTIP (procalcitonin:4,lacticidven:4)  ) Recent Results (from the past 240 hour(s))  SARS Coronavirus 2 by RT PCR (hospital order, performed in [facility] hospital lab) *cepheid single  result test* Anterior Nasal Swab     Status: None   Collection Time: 10/27/22  6:37 PM   Specimen: Anterior Nasal Swab  Result Value Ref Range Status   SARS Coronavirus 2 by RT PCR NEGATIVE NEGATIVE Final    Comment: (NOTE) SARS-CoV-2 target nucleic acids are NOT DETECTED.  The SARS-CoV-2 RNA is generally detectable in upper and lower respiratory specimens during the acute phase of infection. The lowest concentration of SARS-CoV-2 viral copies this assay can detect is 250 copies / mL. A negative result does not preclude SARS-CoV-2 infection and should not be used as the sole basis for treatment or other patient management decisions.  A negative result may occur with improper specimen collection / handling, submission of specimen other than nasopharyngeal swab, presence of viral mutation(s) within the areas targeted by this assay, and inadequate number of viral copies (<250 copies / mL). A negative result must be combined with clinical observations, patient history, and epidemiological information.  Fact Sheet for Patients:    RoadLapTop.co.za  Fact Sheet for Healthcare Providers: http://kim-miller.com/  This test is not yet approved or  cleared by the [place] FDA and has been authorized for detection and/or diagnosis of SARS-CoV-2 by FDA under an Emergency Use Authorization (EUA).  This EUA will remain in effect (meaning this test can be used) for the duration of the COVID-19 declaration under Section 564(b)(1) of the Act, 21 U.S.C. section 360bbb-3(b)(1), unless the authorization is terminated or revoked sooner.  Performed at [facility], [address] [place], [place] [address]   MRSA Next Gen by PCR, Nasal     Status: None   Collection Time: 10/27/22  9:04 PM   Specimen: Nasal Mucosa; Nasal Swab  Result Value Ref Range Status   MRSA by PCR Next Gen NOT DETECTED NOT DETECTED Final    Comment: (NOTE) The GeneXpert MRSA Assay (FDA approved for NASAL specimens only), is one component of a comprehensive MRSA colonization surveillance program. It is not intended to diagnose MRSA infection nor to guide or monitor treatment for MRSA infections. Test performance is not FDA approved in patients less than 65 years old. Performed at [facility], [address] [place], [place] [address]          Radiology Studies: DG Chest Portable 1 View  Result Date: 10/27/2022 CLINICAL DATA:  Nausea/vomiting/abdominal pain EXAM: PORTABLE CHEST - 1 VIEW COMPARISON:  09/03/2022 FINDINGS: Stable right IJ hemodialysis catheter to the proximal right atrium. Resolution of the interstitial edema seen previously. Left retrocardiac consolidation/atelectasis. Stable borderline cardiomegaly. No effusion. Visualized bones unremarkable. IMPRESSION: Left retrocardiac consolidation/atelectasis. Electronically Signed   By: Corlis Leak M.D.   On: 10/27/2022 16:51        Scheduled Meds:  Chlorhexidine Gluconate Cloth  6 each Topical Daily   heparin  injection (subcutaneous)  5,000 Units Subcutaneous Q8H   insulin aspart  0-5 Units Subcutaneous QHS   insulin aspart  0-9 Units Subcutaneous TID WC   insulin glargine-yfgn  5 Units Subcutaneous Daily   nicotine  14 mg Transdermal Daily   sodium chloride flush  10 mL Intravenous Q12H   Continuous Infusions:  sodium chloride Stopped (10/27/22 2022)   dextrose 5 % and 0.45 % NaCl 40 mL/hr at 10/28/22 0700     LOS: 1 day     Silvano Bilis, MD [facility]   If 7PM-7AM, please contact night-coverage [email or web address] Password [facility] 10/28/2022, 8:18 AM

## 2022-10-28 NOTE — Care Plan (Signed)
   BRIEF PCCM NOTE   BRIEF PT DESCRIPTION / SYNOPSIS :  67 y.o. male with PMHx significant for ESRD on HD, HFrEF, CVA, and substance abuse who is admitted with severe DKA, likely in setting of medication noncompliance.   SUBJECTIVE / INTERVAL HISTORY :  -Received CRRT overnight ~ being discontinued today with plan to resume intermittent HD tomorrow per Nephrology -DKA has resolved ~ being converted off insulin gtt -PCCM will SIGN OFF at this time   OBJECTIVE :   Today's Vitals   10/28/22 0630 10/28/22 0645 10/28/22 0700 10/28/22 0800  BP: 104/71 101/72 (!) 88/68 100/63  Pulse:  66  69  Resp: 15 16 14 16   Temp:      TempSrc:      SpO2:  96%  (!) 87%  Weight:      Height:      PainSc:       Body mass index is 20.22 kg/m.   ASSESSMENT / PLAN :   #DKA, likely due to medication noncompliance (hasn't taken insulin x1 week) ~ RESOLVED -Follow DKA Protocol -Converting from insulin gtt to SSI & Basal insulin -Diabetes Coordinator consulted, appreciate input -Hgb A1c pending -UA and UDS is pending, No obvious source of infection ~ COVID negative, troponin minmially elevated (peaked at 34) consistent with demand ischemia   #ESRD on Hemodialysis (M,W,F) #Hyperkalemia ~ RESOLVED #Hyponatremia #AG Metabolic Acidosis ~ IMPROVED -Monitor I&O's / urinary output -Follow BMP -Ensure adequate renal perfusion -Avoid nephrotoxic agents as able -Replace electrolytes as indicated ~ Pharmacy following for assistance with electrolyte replacement -Nephrology following, appreciate input ~ HD as per Nephrology   #Chronic HFrEF PMHx: HTN Echocardiogram 06/26/21: LVEF 25-30%, grade II DD, RV systolic function is normal, moderate MR -Continuous cardiac monitoring -Maintain MAP >65 -Cautious IV fluids -Vasopressors as needed to maintain MAP goal ~ not requiring -Trend HS Troponin until peaked -Volume removal with HD/CRRT       This is a nonbilliable note  Harlon Ditty,  AGACNP-BC [facility] Prefer epic messenger for cross cover needs If after hours, please call E-link

## 2022-10-28 NOTE — Progress Notes (Signed)
CRRT and insulin drip stopped this morning.Patient has been sleepy all day.Refused all meals or ate a scant amount off his tray. Also not interested in Ensure or Nepro supplements. Used urinal for output of 300 ml this morning but none since order for urine tox screen or ua. Very quiet today.

## 2022-10-28 NOTE — Plan of Care (Signed)
  Problem: Metabolic: Goal: Ability to maintain appropriate glucose levels will improve Outcome: Progressing   Problem: Fluid Volume: Goal: Ability to achieve a balanced intake and output will improve Outcome: Progressing   Problem: Metabolic: Goal: Ability to maintain appropriate glucose levels will improve Outcome: Progressing   Problem: Safety: Goal: Ability to remain free from injury will improve Outcome: Progressing

## 2022-10-28 NOTE — Progress Notes (Signed)
Initial Nutrition Assessment  DOCUMENTATION CODES:   Severe malnutrition in context of chronic illness  INTERVENTION:   Ensure Enlive po TID, each supplement provides 350 kcal and 20 grams of protein.  Magic cup TID with meals, each supplement provides 290 kcal and 9 grams of protein  Rena-vit po daily   Liberalize diet   Pt at high refeed risk; recommend monitor potassium, magnesium and phosphorus labs daily until stable  Daily weights  NUTRITION DIAGNOSIS:   Severe Malnutrition related to chronic illness as evidenced by severe fat depletion, severe muscle depletion.  GOAL:   Patient will meet greater than or equal to 90% of their needs  MONITOR:   PO intake, Supplement acceptance, Labs, Weight trends, I & O's, Skin  REASON FOR ASSESSMENT:   Consult Assessment of nutrition requirement/status  ASSESSMENT:   67 y/o male with past medical history of DM2 with multiple episodes of hyperglycemic crisis, chronic systolic HF, polysubstance use disorder, etoh abuse, HTN, NICM, ESRD on HD, BPH, hypothyroidism, HLD, stroke and HHS who is admitted with DKA.  Met with pt in room today. Pt is well known to this RD from numerous previous admissions. Pt with poor appetite and oral intake at baseline; pt does drink chocolate Ensure at home. Pt reports nausea and vomiting for 1-2 days pta. Pt's breakfast tray is sitting on his side table untouched. RD discussed with pt the importance of adequate nutrition needed to preserve lean muscle. RD will add supplements and vitamins to help pt meet his estimated needs. RD will also liberalize pt's diet as pt is not eating enough to exceed any nutrient limits. Pt is at high refeed risk. Per chart, pt appears fairly weight stable at baseline.    Medications reviewed and include: insulin, synthroid, nicotine, protonix  Labs reviewed: Na 134(L), K 4.7 wnl, BUN 81(H), creat 5.40(H), P 5.8(H), Mg 2.2 wnl Hgb 9.3(L), Hct 28.1(L) Cbgs- 105, 93, 91, 127,  67, 81, 77, 78, 93 x 24 hrs  AIC 11.0(H)- 8/25  NUTRITION - FOCUSED PHYSICAL EXAM:  Flowsheet Row Most Recent Value  Orbital Region Severe depletion  Upper Arm Region Severe depletion  Thoracic and Lumbar Region Severe depletion  Buccal Region Severe depletion  Temple Region Severe depletion  Clavicle Bone Region Severe depletion  Clavicle and Acromion Bone Region Severe depletion  Scapular Bone Region Severe depletion  Dorsal Hand Severe depletion  Patellar Region Severe depletion  Anterior Thigh Region Severe depletion  Posterior Calf Region Severe depletion  Edema (RD Assessment) Moderate  Hair Reviewed  Eyes Reviewed  Mouth Reviewed  Skin Reviewed  Nails Reviewed   Diet Order:   Diet Order             Diet regular Room service appropriate? Yes; Fluid consistency: Thin; Fluid restriction: 1800 mL Fluid  Diet effective now                  EDUCATION NEEDS:   Education needs have been addressed  Skin:  Skin Assessment: Reviewed RN Assessment  Last BM:  10/24- type 6  Height:   Ht Readings from Last 1 Encounters:  10/27/22 5\' 11"  (1.803 m)    Weight:   Wt Readings from Last 1 Encounters:  10/27/22 65.8 kg    Ideal Body Weight:  78 kg  BMI:  Body mass index is 20.22 kg/m.  Estimated Nutritional Needs:   Kcal:  1900-2200kcal/day  Protein:  95-110g/day  Fluid:  UOP +1L  Betsey Holiday MS, RD, LDN Please  refer to [facility] for RD and/or RD on-call/weekend/after hours pager

## 2022-10-28 NOTE — Inpatient Diabetes Management (Signed)
Inpatient Diabetes Program Recommendations  AACE/ADA: New Consensus Statement on Inpatient Glycemic Control (2015)  Target Ranges:  Prepandial:   less than 140 mg/dL      Peak postprandial:   less than 180 mg/dL (1-2 hours)      Critically ill patients:  140 - 180 mg/dL    Latest Reference Range & Units 10/27/22 14:29 10/27/22 22:18 10/28/22 05:58  Beta-Hydroxybutyric Acid 0.05 - 0.27 mmol/L 5.40 (H) 0.32 (H) 0.26  (H): Data is abnormally high  Latest Reference Range & Units 10/27/22 23:35 10/28/22 00:38 10/28/22 01:41 10/28/22 02:13 10/28/22 03:14 10/28/22 04:12 10/28/22 04:35 10/28/22 05:40 10/28/22 06:46  Glucose-Capillary 70 - 99 mg/dL 086 (H) 93 78 77 81 67 (L) 127 (H) 91  IV Insulin Drip Running 93  (H): Data is abnormally high (L): Data is abnormally low  Admit with: DKA--Missed insulin x 1 week w/ 2 days of intractable nausea and vomiting   History: DM, ESRD, CHF, CVA  Home DM Meds: Semglee 5 units Daily at 12pm  Current Orders: IV Insulin Drip    Novolog Insulin stopped from home meds 08/31/2022    MD- Note 5:58am BMET shows the following: Glucose 102/ Anion Gap 12/ CO2 level 21  When you allow pt to transition to SQ Insulin, please consider:  1. Start Semglee 5 units daily (home dose) Please continue IV Insulin Drip for 2 hours after Semglee on board and then can d/c IV Insulin  2. Start Novolog Sensitive Correction Scale/ SSI (0-9 units) TID AC + HS    --Will follow patient during hospitalization--  Ambrose Finland RN, MSN, CDCES Diabetes Coordinator Inpatient Glycemic Control Team Team Pager: [phone] (8a-5p)

## 2022-10-28 NOTE — Progress Notes (Signed)
[facility]  ROUNDING NOTE   Subjective:   Shawn [name] is a 67 y.o. male with past medical conditions including end stage renal disease on hemodialysis, hypertension, diabetes mellitus type II, congestive heart failure, coronary artery disease, CVA, alcohol abuse, tobacco abuse and hypothyroidism. He presents to the ED with nausea, vomiting and abd pain. Patient has been admitted for DKA (diabetic ketoacidosis) ([name]) [E11.10] Type 1 diabetes mellitus with ketoacidosis without coma ([name]) [E10.10]  Patient is known to our practice and receives outpatient dialysis treatments at [facility] on a MWF schedule, supervised by Dr Thedore Mins. Last outpatient treatment on 10/22/22 and he missed treatment on 10/20/22.   Labs on ED arrival significant for sodium 131, potassium 6.1, serum bicarb 10, BUN 100, creatinine 7.57 with GFR 7, and hemoglobin 9.8.  Chest x-ray shows a left retrocardiac consolidation.  Patient placed in ICU yesterday evening and received CRRT overnight.   Objective:  Vital signs in last 24 hours:  Temp:  [98.1 F (36.7 C)-98.3 F (36.8 C)] 98.3 F (36.8 C) (10/24 0800) Pulse Rate:  [49-75] 72 (10/24 1100) Resp:  [12-21] 15 (10/24 1200) BP: (86-140)/(58-88) 100/69 (10/24 1200) SpO2:  [87 %-100 %] 93 % (10/24 1200)  Weight change:  Filed Weights   10/27/22 1153  Weight: 65.8 kg    Intake/Output: I/O last 3 completed shifts: In: 1021.7 [I.V.:521.7; IV Piggyback:500] Out: 307    Intake/Output this shift:  Total I/O In: 30 [I.V.:30] Out: -   Physical Exam: General: Ill-appearing  Head: Normocephalic, atraumatic. Moist oral mucosal membranes  Eyes: Anicteric  Lungs:  Shallow breaths, room air  Heart: Regular rate and rhythm  Abdomen:  Soft, nontender,   Extremities: 1-2+ peripheral edema.  Neurologic: Alert moving all four extremities  Skin: No lesions  Access: Right PermCath    Basic Metabolic Panel: Recent Labs  Lab 10/27/22 1429  10/27/22 1837 10/27/22 2218 10/28/22 0305 10/28/22 0558  NA 131* 134* 136 135 134*  K 6.1* 4.7 4.7 4.7 4.7  CL 97* 100 100 103 101  CO2 10* 14* 16* 20* 21*  GLUCOSE 415* 298* 167* 75 102*  BUN 100* 100* 100* 91* 81*  CREATININE 7.57* 7.63* 7.72* 6.13* 5.40*  CALCIUM 8.2* 8.1* 8.2* 7.6* 7.7*  MG  --   --   --   --  2.2  PHOS  --   --   --  5.8*  --     Liver Function Tests: Recent Labs  Lab 10/27/22 1155 10/28/22 0305  AST 21  --   ALT 13  --   ALKPHOS 92  --   BILITOT 1.3*  --   PROT 6.5  --   ALBUMIN 2.9* 2.7*   Recent Labs  Lab 10/27/22 1155  LIPASE 20   No results for input(s): "AMMONIA" in the last 168 hours.  CBC: Recent Labs  Lab 10/24/22 0708 10/27/22 1155 10/27/22 1525 10/28/22 0558  WBC 2.9* 7.0 7.4 5.5  NEUTROABS  --   --  6.3  --   HGB 11.5* 9.8* 9.6* 9.3*  HCT 36.9* 31.0* 30.8* 28.1*  MCV 95.1 95.4 97.5 91.5  PLT 186 217 222 194    Cardiac Enzymes: No results for input(s): "CKTOTAL", "CKMB", "CKMBINDEX", "TROPONINI" in the last 168 hours.  BNP: Invalid input(s): "POCBNP"  CBG: Recent Labs  Lab 10/28/22 0412 10/28/22 0435 10/28/22 0540 10/28/22 0646 10/28/22 0904  GLUCAP 67* 127* 91 93 105*    Microbiology: Results for orders placed or  performed during the hospital encounter of 10/27/22  SARS Coronavirus 2 by RT PCR (hospital order, performed in [facility] hospital lab) *cepheid single result test* Anterior Nasal Swab     Status: None   Collection Time: 10/27/22  6:37 PM   Specimen: Anterior Nasal Swab  Result Value Ref Range Status   SARS Coronavirus 2 by RT PCR NEGATIVE NEGATIVE Final    Comment: (NOTE) SARS-CoV-2 target nucleic acids are NOT DETECTED.  The SARS-CoV-2 RNA is generally detectable in upper and lower respiratory specimens during the acute phase of infection. The lowest concentration of SARS-CoV-2 viral copies this assay can detect is 250 copies / mL. A negative result does not preclude SARS-CoV-2  infection and should not be used as the sole basis for treatment or other patient management decisions.  A negative result may occur with improper specimen collection / handling, submission of specimen other than nasopharyngeal swab, presence of viral mutation(s) within the areas targeted by this assay, and inadequate number of viral copies (<250 copies / mL). A negative result must be combined with clinical observations, patient history, and epidemiological information.  Fact Sheet for Patients:   RoadLapTop.co.za  Fact Sheet for Healthcare Providers: http://kim-miller.com/  This test is not yet approved or  cleared by the [place] FDA and has been authorized for detection and/or diagnosis of SARS-CoV-2 by FDA under an Emergency Use Authorization (EUA).  This EUA will remain in effect (meaning this test can be used) for the duration of the COVID-19 declaration under Section 564(b)(1) of the Act, 21 U.S.C. section 360bbb-3(b)(1), unless the authorization is terminated or revoked sooner.  Performed at [facility], [address] [place], [place] [address]   MRSA Next Gen by PCR, Nasal     Status: None   Collection Time: 10/27/22  9:04 PM   Specimen: Nasal Mucosa; Nasal Swab  Result Value Ref Range Status   MRSA by PCR Next Gen NOT DETECTED NOT DETECTED Final    Comment: (NOTE) The GeneXpert MRSA Assay (FDA approved for NASAL specimens only), is one component of a comprehensive MRSA colonization surveillance program. It is not intended to diagnose MRSA infection nor to guide or monitor treatment for MRSA infections. Test performance is not FDA approved in patients less than 34 years old. Performed at [facility], [address] [place], [place] [address]     Coagulation Studies: No results for input(s): "LABPROT", "INR" in the last 72 hours.  Urinalysis: No results for input(s): "COLORURINE",  "LABSPEC", "PHURINE", "GLUCOSEU", "HGBUR", "BILIRUBINUR", "KETONESUR", "PROTEINUR", "UROBILINOGEN", "NITRITE", "LEUKOCYTESUR" in the last 72 hours.  Invalid input(s): "APPERANCEUR"    Imaging: DG Chest Portable 1 View  Result Date: 10/27/2022 CLINICAL DATA:  Nausea/vomiting/abdominal pain EXAM: PORTABLE CHEST - 1 VIEW COMPARISON:  09/03/2022 FINDINGS: Stable right IJ hemodialysis catheter to the proximal right atrium. Resolution of the interstitial edema seen previously. Left retrocardiac consolidation/atelectasis. Stable borderline cardiomegaly. No effusion. Visualized bones unremarkable. IMPRESSION: Left retrocardiac consolidation/atelectasis. Electronically Signed   By: Corlis Leak M.D.   On: 10/27/2022 16:51     Medications:    sodium chloride 40 mL/hr (10/28/22 1049)   dextrose 5 % and 0.45 % NaCl Stopped (10/28/22 1000)    atorvastatin  40 mg Oral QHS   Chlorhexidine Gluconate Cloth  6 each Topical Daily   feeding supplement  237 mL Oral TID BM   furosemide  20 mg Intravenous Once   insulin aspart  0-5 Units Subcutaneous QHS   insulin aspart  0-9 Units Subcutaneous TID WC   insulin glargine-yfgn  5 Units Subcutaneous Daily   [START ON 10/29/2022] levothyroxine  75 mcg Oral QAC breakfast   multivitamin  1 tablet Per Tube QHS   nicotine  14 mg Transdermal Daily   pantoprazole (PROTONIX) IV  40 mg Intravenous Q12H   sodium chloride flush  10 mL Intravenous Q12H   dextrose, heparin  Assessment/ Plan:  [name] is a 67 y.o.  male with end stage renal disease on hemodialysis, hypertension, diabetes mellitus type II, congestive heart failure, coronary artery disease, CVA, alcohol abuse, tobacco abuse and hypothyroidism   End-stage renal disease with hyperkalemia on hemodialysis.  Patient has missed multiple treatments over the past week.  Patient received CRRT overnight to correct potassium and acidosis.  CRRT stopped this morning.  Will schedule intermittent  hemodialysis for tomorrow to maintain outpatient schedule.  2. Diabetes mellitus type II with chronic kidney disease with diabetic ketoacidosis: Insulin-dependent with Lantus.  Blood sugar 436 on admission.  Gentle IV hydration and insulin drip ordered per primary team.  3.  Acute metabolic acidosis.  Serum bicarb 10 on admission.  Corrected with CRRT.  4. Anemia of chronic kidney disease Lab Results  Component Value Date   HGB 9.3 (L) 10/28/2022    Hemoglobin still within optimal range.  Patient receives Mircera at outpatient clinic.  Will continue to monitor for need of ESA during this admission.  5.  Hypertension with chronic kidney disease.  Home regimen includes carvedilol and furosemide.  Both currently held   LOS: 1 Ram Haugan 10/24/202412:38 PM

## 2022-10-28 NOTE — Evaluation (Signed)
Physical Therapy Evaluation Patient Details Name: Shawn [name] MRN: [id number] DOB: 05/09/1955 Today's Date: 10/28/2022  History of Present Illness  67 y.o. male with medical history significant of HFrEF, ESRD on hemodialysis Monday Wednesday Friday, substance abuse, history of CVA presenting with DKA, missed hemodialysis.  Patient reports approximate 2 days of intractable nausea and vomiting - found to be in DKA.  Clinical Impression  Pt keeping eyes closed most of the time but did, ostensibly, show some willingness to participate with repeated cuing.  He displayed limited AROM in the LEs, but did assist some with getting toward EOB, needing direct assist to get to sitting.  We were all set up to try standing and he citing feeling too stiff and sore to try and ultimately refused, starting to lay himself back down in CCU, needing assist to actually get LEs up into bed.       If plan is discharge home, recommend the following: Two people to help with walking and/or transfers;A lot of help with bathing/dressing/bathroom;Assistance with cooking/housework;Assist for transportation;Help with stairs or ramp for entrance   Can travel by private vehicle   No    Equipment Recommendations None recommended by PT (TBD at rehab)  Recommendations for Other Services       Functional Status Assessment Patient has had a recent decline in their functional status and demonstrates the ability to make significant improvements in function in a reasonable and predictable amount of time.     Precautions / Restrictions Restrictions Weight Bearing Restrictions: No      Mobility  Bed Mobility Overal bed mobility: Needs Assistance Bed Mobility: Supine to Sit, Sit to Supine     Supine to sit: Min assist Sit to supine: Mod assist   General bed mobility comments: Pt needed a lot of cuing to motivate movement toward EOB, he did initiate some but ultimately needed direct assist to pull trunk and legs  toward sitting    Transfers                   General transfer comment: all set up with belt, walker, lines/leads, etc and pt refuses citing being "too stiff and sore" and despite much encouargement he ultimately refused and tried to lay himself back down, needed assist to get LEs back up into the bed    Ambulation/Gait                  Stairs            Wheelchair Mobility     Tilt Bed    Modified Rankin (Stroke Patients Only)       Balance Overall balance assessment: Needs assistance Sitting-balance support: Bilateral upper extremity supported Sitting balance-Leahy Scale: Fair Sitting balance - Comments: leaning on the L elbow/hand but did maintain upright sitting w/o physical assist for ~3 minutes                                     Pertinent Vitals/Pain Pain Assessment Pain Assessment: Faces Faces Pain Scale: Hurts even more Pain Location: c/o soreness, stiffness, and states multiple times "I feel like hell"    Home Living Family/patient expects to be discharged to:: Private residence Living Arrangements: Other relatives Available Help at Discharge: Family;Available 24 hours/day Type of Home: House Home Access: Ramped entrance       Home Layout: One level Home Equipment: Scientist, research (medical) (4 wheels);Cane -  single point;BSC/3in1;Rolling Walker (2 wheels)      Prior Function Prior Level of Function : Needs assist             Mobility Comments: per prior notes uses a WC, stands only for transfers to/from bed, WC, BSC ADLs Comments: Sister/PCA assist with ADLs. Pt reports he uses a BSC for toileting and sister assists him with sponge bathing, as he is unable to access the bathroom in their house.     Extremity/Trunk Assessment   Upper Extremity Assessment Upper Extremity Assessment: Generalized weakness    Lower Extremity Assessment Lower Extremity Assessment: Generalized weakness       Communication    Communication Communication: No apparent difficulties  Cognition Arousal: Alert Behavior During Therapy: Flat affect Overall Cognitive Status: Difficult to assess                                 General Comments: Pt mostly keeping eyes closed and grunting yes/no to questions, though he did wake enough to particpate with some history gathering and getting to EOB        General Comments General comments (skin integrity, edema, etc.): Pt sleepy, not wanting to do a lot, able to get to/maintain sitting but refused standing/gait attempts    Exercises     Assessment/Plan    PT Assessment Patient needs continued PT services  PT Problem List Decreased strength;Decreased range of motion;Decreased activity tolerance;Decreased balance;Decreased mobility;Decreased knowledge of use of DME;Decreased safety awareness;Pain       PT Treatment Interventions DME instruction;Gait training;Stair training;Functional mobility training;Therapeutic activities;Therapeutic exercise;Balance training;Patient/family education;Wheelchair mobility training;Neuromuscular re-education    PT Goals (Current goals can be found in the Care Plan section)  Acute Rehab PT Goals Patient Stated Goal: none stated PT Goal Formulation: With patient Time For Goal Achievement: 11/10/22 Potential to Achieve Goals: Fair    Frequency Min 1X/week     Co-evaluation               AM-PAC PT "6 Clicks" Mobility  Outcome Measure Help needed turning from your back to your side while in a flat bed without using bedrails?: A Little Help needed moving from lying on your back to sitting on the side of a flat bed without using bedrails?: A Little Help needed moving to and from a bed to a chair (including a wheelchair)?: A Lot Help needed standing up from a chair using your arms (e.g., wheelchair or bedside chair)?: A Lot Help needed to walk in hospital room?: Total Help needed climbing 3-5 steps with a  railing? : Total 6 Click Score: 12    End of Session Equipment Utilized During Treatment: Oxygen Activity Tolerance: Patient limited by fatigue;Patient limited by lethargy;Patient limited by pain Patient left: with bed alarm set;with call bell/phone within reach Nurse Communication: Mobility status PT Visit Diagnosis: Muscle weakness (generalized) (M62.81);Difficulty in walking, not elsewhere classified (R26.2)    Time: 1205-1222 PT Time Calculation (min) (ACUTE ONLY): 17 min   Charges:   PT Evaluation $PT Eval Low Complexity: 1 Low PT Treatments $Therapeutic Activity: 8-22 mins PT General Charges $$ ACUTE PT VISIT: 1 Visit         Malachi Pro, DPT 10/28/2022, 1:10 PM

## 2022-10-29 LAB — RENAL FUNCTION PANEL
Albumin: 2.6 g/dL — ABNORMAL LOW (ref 3.5–5.0)
Anion gap: 11 (ref 5–15)
BUN: 80 mg/dL — ABNORMAL HIGH (ref 8–23)
CO2: 21 mmol/L — ABNORMAL LOW (ref 22–32)
Calcium: 8.1 mg/dL — ABNORMAL LOW (ref 8.9–10.3)
Chloride: 103 mmol/L (ref 98–111)
Creatinine, Ser: 5.76 mg/dL — ABNORMAL HIGH (ref 0.61–1.24)
GFR, Estimated: 10 mL/min — ABNORMAL LOW (ref >60)
Glucose, Bld: 56 mg/dL — ABNORMAL LOW (ref 70–99)
Phosphorus: 5.8 mg/dL — ABNORMAL HIGH (ref 2.5–4.6)
Potassium: 4.6 mmol/L (ref 3.5–5.1)
Sodium: 135 mmol/L (ref 135–145)

## 2022-10-29 LAB — CBC
HCT: 29.2 % — ABNORMAL LOW (ref 39.0–52.0)
Hemoglobin: 9.4 g/dL — ABNORMAL LOW (ref 13.0–17.0)
MCH: 29.9 pg (ref 26.0–34.0)
MCHC: 32.2 g/dL (ref 30.0–36.0)
MCV: 93 fL (ref 80.0–100.0)
Platelets: 193 10*3/uL (ref 150–400)
RBC: 3.14 MIL/uL — ABNORMAL LOW (ref 4.22–5.81)
RDW: 16 % — ABNORMAL HIGH (ref 11.5–15.5)
WBC: 6.2 10*3/uL (ref 4.0–10.5)
nRBC: 0 % (ref 0.0–0.2)

## 2022-10-29 LAB — GLUCOSE, CAPILLARY
Glucose-Capillary: 63 mg/dL — ABNORMAL LOW (ref 70–99)
Glucose-Capillary: 67 mg/dL — ABNORMAL LOW (ref 70–99)
Glucose-Capillary: 97 mg/dL (ref 70–99)
Glucose-Capillary: 157 mg/dL — ABNORMAL HIGH (ref 70–99)
Glucose-Capillary: 183 mg/dL — ABNORMAL HIGH (ref 70–99)

## 2022-10-29 LAB — HEPATITIS B SURFACE ANTIGEN: Hepatitis B Surface Ag: NONREACTIVE

## 2022-10-29 MED ORDER — ATORVASTATIN CALCIUM 20 MG PO TABS
40.0000 mg | ORAL_TABLET | Freq: Every day | ORAL | Status: DC
  Administered 2022-10-30: 40 mg
  Filled 2022-10-29: qty 2

## 2022-10-29 MED ORDER — ASPIRIN 81 MG PO CHEW
81.0000 mg | CHEWABLE_TABLET | Freq: Every day | ORAL | Status: DC
  Administered 2022-10-30 – 2022-10-31 (×2): 81 mg
  Filled 2022-10-29 (×2): qty 1

## 2022-10-29 MED ORDER — LEVOTHYROXINE SODIUM 50 MCG PO TABS
75.0000 ug | ORAL_TABLET | Freq: Every day | ORAL | Status: DC
  Administered 2022-10-30 – 2022-10-31 (×2): 75 ug
  Filled 2022-10-29 (×2): qty 1

## 2022-10-29 MED ORDER — CARVEDILOL 3.125 MG PO TABS
3.1250 mg | ORAL_TABLET | Freq: Two times a day (BID) | ORAL | Status: DC
  Administered 2022-10-29: 3.125 mg via ORAL
  Filled 2022-10-29: qty 1

## 2022-10-29 MED ORDER — CARVEDILOL 3.125 MG PO TABS
3.1250 mg | ORAL_TABLET | Freq: Two times a day (BID) | ORAL | Status: DC
  Administered 2022-10-30 – 2022-10-31 (×3): 3.125 mg
  Filled 2022-10-29 (×3): qty 1

## 2022-10-29 MED ORDER — ASPIRIN 81 MG PO TBEC
81.0000 mg | DELAYED_RELEASE_TABLET | Freq: Every day | ORAL | Status: DC
  Administered 2022-10-29: 81 mg via ORAL
  Filled 2022-10-29: qty 1

## 2022-10-29 NOTE — NC FL2 (Signed)
[place] MEDICAID FL2 LEVEL OF CARE FORM     IDENTIFICATION  Patient Name: Shawn [name] Birthdate: 1955-10-27 Sex: male Admission Date (Current Location): 10/27/2022  [facility] and [place] Number:  Chiropodist and Address:  [facility], [address] [place], [place] [address]      Provider Number: [id number]  Attending Physician Name and Address:  Lucile Shutters, MD  Relative Name and Phone Number:  [name] (sister)  [phone]    Current Level of Care: Hospital Recommended Level of Care: Skilled Nursing Facility Prior Approval Number:    Date Approved/Denied:   PASRR Number: [id number]  Discharge Plan: SNF    Current Diagnoses: Patient Active Problem List   Diagnosis Date Noted   Diabetic ketoacidosis without coma associated with type 2 diabetes mellitus ([name]) 10/27/2022   Sepsis ([name]) 09/03/2022   HFrEF (heart failure with reduced ejection fraction) ([name]) 09/03/2022   Nausea and vomiting 08/30/2022   ESRD on dialysis ([name]) 08/29/2022   Acute exacerbation of CHF (congestive heart failure) ([name]) 08/13/2021   Iron deficiency anemia 07/01/2021   Hyperosmolar hyperglycemic state (HHS) ([name]) 03/16/2020   Acute metabolic encephalopathy 03/16/2020   Elevated troponin 03/16/2020   HLD (hyperlipidemia) 03/16/2020   Abnormal CXR 03/16/2020   Hypothermia 03/16/2020   Hypotension 04/30/2019   Stroke ([name])    Protein-calorie malnutrition, severe ([name]) 04/25/2019   Hyperkalemia 04/23/2019   Acute kidney injury superimposed on CKD 3b ([name]) 04/23/2019   C. difficile colitis 04/23/2019   Hypoglycemia 09/22/2018   Essential hypertension 05/12/2018   Chronic HFrEF (heart failure with reduced ejection fraction) ([name]) 12/15/2017   NICM (nonischemic cardiomyopathy) ([name]) 05/13/2017   T2DM (type 2 diabetes mellitus) ([name]) 05/13/2017   Smoker 05/13/2017   Alcohol abuse 05/13/2017    Orientation RESPIRATION BLADDER Height & Weight      Self, Time, Situation, Place  Normal Incontinent, External catheter Weight: 135 lb 2.3 oz (61.3 kg) Height:  5\' 11"  (180.3 cm)  BEHAVIORAL SYMPTOMS/MOOD NEUROLOGICAL BOWEL NUTRITION STATUS      Incontinent Diet (see discharge summary)  AMBULATORY STATUS COMMUNICATION OF NEEDS Skin   Limited Assist Verbally Other (Comment) (abrasion lower sacrum)                       Personal Care Assistance Level of Assistance  Bathing, Feeding, Dressing, Total care Bathing Assistance: Limited assistance Feeding assistance: Independent Dressing Assistance: Limited assistance Total Care Assistance: Limited assistance   Functional Limitations Info  Sight, Hearing, Speech Sight Info: Adequate Hearing Info: Adequate Speech Info: Adequate    SPECIAL CARE FACTORS FREQUENCY  PT (By licensed PT), OT (By licensed OT)     PT Frequency: min 4x weekly OT Frequency: min 4x weekly            Contractures Contractures Info: Not present    Additional Factors Info  Code Status, Allergies Code Status Info: full Allergies Info: Empagliflozin  Pravastatin  Simvastatin           Current Medications (10/29/2022):  This is the current hospital active medication list Current Facility-Administered Medications  Medication Dose Route Frequency Provider Last Rate Last Admin   alteplase (CATHFLO ACTIVASE) injection 2 mg  2 mg Intracatheter Once PRN Wendee Beavers, NP       atorvastatin (LIPITOR) tablet 40 mg  40 mg Oral QHS Kathrynn Running, MD   40 mg at 10/28/22 2126   Chlorhexidine Gluconate Cloth 2 % PADS 6 each  6 each  Topical Daily Floydene Flock, MD   6 each at 10/28/22 1056   dextrose 50 % solution 0-50 mL  0-50 mL Intravenous PRN Willy Eddy, MD   35 mL at 10/28/22 0419   feeding supplement (ENSURE ENLIVE / ENSURE PLUS) liquid 237 mL  237 mL Oral TID BM Wouk, Wilfred Curtis, MD   237 mL at 10/28/22 1000   heparin injection 1,000 Units  1,000 Units Intracatheter PRN Wendee Beavers, NP       heparin injection 1,600 Units  25 Units/kg Dialysis PRN Wendee Beavers, NP   1,600 Units at 10/29/22 0748   insulin aspart (novoLOG) injection 0-5 Units  0-5 Units Subcutaneous QHS Wouk, Wilfred Curtis, MD       insulin aspart (novoLOG) injection 0-9 Units  0-9 Units Subcutaneous TID WC Wouk, Wilfred Curtis, MD       insulin glargine-yfgn [facility]) injection 5 Units  5 Units Subcutaneous Daily Kathrynn Running, MD   5 Units at 10/28/22 1057   levothyroxine (SYNTHROID) tablet 75 mcg  75 mcg Oral QAC breakfast Kathrynn Running, MD   75 mcg at 10/29/22 0505   multivitamin (RENA-VIT) tablet 1 tablet  1 tablet Per Tube QHS Kathrynn Running, MD   1 tablet at 10/28/22 2126   nicotine (NICODERM CQ - dosed in mg/24 hours) patch 14 mg  14 mg Transdermal Daily Floydene Flock, MD   14 mg at 10/28/22 1038   pantoprazole (PROTONIX) injection 40 mg  40 mg Intravenous Q12H Kathrynn Running, MD   40 mg at 10/28/22 2126   sodium chloride flush (NS) 0.9 % injection 10 mL  10 mL Intravenous Q12H Willy Eddy, MD   10 mL at 10/28/22 2126     Discharge Medications: Please see discharge summary for a list of discharge medications.  Relevant Imaging Results:  Relevant Lab Results:   Additional Information SSN: [id number] [facility] HD MWF  [place], [place]

## 2022-10-29 NOTE — TOC Initial Note (Signed)
Transition of Care [name]) - Initial/Assessment Note    Patient Details  Name: Shawn [name] MRN: [id number] Date of Birth: 1955/07/28  Transition of Care [facility]) CM/SW Contact:    Darolyn Rua, LCSW Phone Number: 10/29/2022, 12:51 PM  Clinical Narrative:                  CSW met with patient at bedside to review SNF recommendation, patient agrees he is weak and will need SNF. Would like referrals to be sent out then CSW give him bed offers once available.   Referrals sent out pending bed offers at this time.   Expected Discharge Plan: Skilled Nursing Facility Barriers to Discharge: Continued Medical Work up   Patient Goals and CMS Choice Patient states their goals for this hospitalization and ongoing recovery are:: to go home CMS Medicare.gov Compare Post Acute Care list provided to:: Patient Choice offered to / list presented to : Patient      Expected Discharge Plan and Services       Living arrangements for the past 2 months: Single Family Home                                      Prior Living Arrangements/Services Living arrangements for the past 2 months: Single Family Home Lives with:: Self                   Activities of Daily Living   ADL Screening (condition at time of admission) Independently performs ADLs?: No Does the patient have a NEW difficulty with bathing/dressing/toileting/self-feeding that is expected to last >3 days?: No Does the patient have a NEW difficulty with getting in/out of bed, walking, or climbing stairs that is expected to last >3 days?: No Does the patient have a NEW difficulty with communication that is expected to last >3 days?: No Is the patient deaf or have difficulty hearing?: No Does the patient have difficulty seeing, even when wearing glasses/contacts?: No Does the patient have difficulty concentrating, remembering, or making decisions?: No  Permission Sought/Granted                  Emotional  Assessment              Admission diagnosis:  DKA (diabetic ketoacidosis) ([name]) [E11.10] Type 1 diabetes mellitus with ketoacidosis without coma ([name]) [E10.10] Patient Active Problem List   Diagnosis Date Noted   Diabetic ketoacidosis without coma associated with type 2 diabetes mellitus ([name]) 10/27/2022   Sepsis ([name]) 09/03/2022   HFrEF (heart failure with reduced ejection fraction) ([name]) 09/03/2022   Nausea and vomiting 08/30/2022   ESRD on dialysis ([name]) 08/29/2022   Acute exacerbation of CHF (congestive heart failure) ([name]) 08/13/2021   Iron deficiency anemia 07/01/2021   Hyperosmolar hyperglycemic state (HHS) ([name]) 03/16/2020   Acute metabolic encephalopathy 03/16/2020   Elevated troponin 03/16/2020   HLD (hyperlipidemia) 03/16/2020   Abnormal CXR 03/16/2020   Hypothermia 03/16/2020   Hypotension 04/30/2019   Stroke ([name])    Protein-calorie malnutrition, severe ([name]) 04/25/2019   Hyperkalemia 04/23/2019   Acute kidney injury superimposed on CKD 3b ([name]) 04/23/2019   C. difficile colitis 04/23/2019   Hypoglycemia 09/22/2018   Essential hypertension 05/12/2018   Chronic HFrEF (heart failure with reduced ejection fraction) ([name]) 12/15/2017   NICM (nonischemic cardiomyopathy) ([name]) 05/13/2017   T2DM (type 2 diabetes mellitus) ([name]) 05/13/2017   Smoker 05/13/2017  Alcohol abuse 05/13/2017   PCP:  [facility]:   [facility] - [place], [place] - [address] [place] [address] Phone: [phone] Fax: [phone]  [facility] [address] ([address]), [place] - [address] [place]) [place] [address] Phone: [phone] Fax: [phone]  [facility] [address] [place] [address] Phone: [phone] Fax: [phone]  [facility] [place], [place] - [address] [place]  [address] Phone: [phone] Fax: [phone]     Social Determinants of Health (SDOH) Social History: SDOH Screenings   Food Insecurity: No Food Insecurity (10/27/2022)  Housing: Low Risk  (10/27/2022)  Transportation Needs: No Transportation Needs (10/27/2022)  Utilities: Not At Risk (10/27/2022)  Financial Resource Strain: High Risk (08/24/2019)   Received from [facility], [facility]  Social Connections: Moderately Isolated (08/24/2019)   Received from [facility], [facility]  Stress: No Stress Concern Present (08/24/2019)   Received from [facility], [facility]  Tobacco Use: High Risk (10/27/2022)   SDOH Interventions:     Readmission Risk Interventions    09/04/2022   11:28 AM 06/24/2021    2:57 PM 01/08/2021    1:22 PM  Readmission Risk Prevention Plan  Transportation Screening Complete Complete Complete  PCP or Specialist Appt within 3-5 Days Complete    HRI or Home Care Consult Complete    Social Work Consult for Recovery Care Planning/Counseling Complete  Complete  Palliative Care Screening Not Applicable  Not Applicable  Medication Review Oceanographer) Complete Complete Complete  Palliative Care Screening  Not Applicable   Skilled Nursing Facility  Not Applicable

## 2022-10-29 NOTE — Inpatient Diabetes Management (Addendum)
Inpatient Diabetes Program Recommendations  AACE/ADA: New Consensus Statement on Inpatient Glycemic Control (2015)  Target Ranges:  Prepandial:   less than 140 mg/dL      Peak postprandial:   less than 180 mg/dL (1-2 hours)      Critically ill patients:  140 - 180 mg/dL    Latest Reference Range & Units 10/29/22 09:05 10/29/22 09:25  Glucose-Capillary 70 - 99 mg/dL 63 (L) 67 (L)  (L): Data is abnormally low    Admit with: DKA--Missed insulin x 1 week w/ 2 days of intractable nausea and vomiting    History: DM, ESRD, CHF, CVA   Home DM Meds: Semglee 5 units Daily at 12pm   Current Orders: Semglee 5 units Daily     Novolog Sensitive Correction Scale/ SSI (0-9 units) TID AC + HS           MD- Note Hypoglycemia this AM  Please consider reducing the Semglee to 3 units Daily    Addendum 12:30pm--Met w/ pt briefly at bedside in room after HD.  Pt had the bedcovers pulled up over his head covering his face.  Agreed to talk with me.  Has Insulin at home.  Has CBG meter.  Told me it was only a couple of days that he missed his insulin--Not 1 week.  Stated he knows it's important to take his insulin.  Told me he was tired and wanted to rest.      --Will follow patient during hospitalization--  Ambrose Finland RN, MSN, CDCES Diabetes Coordinator Inpatient Glycemic Control Team Team Pager: [phone] (8a-5p)

## 2022-10-29 NOTE — Progress Notes (Signed)
PT Cancellation Note  Patient Details Name: ALHASSAN PANGBURN MRN: [id number] DOB: [birth year]   Cancelled Treatment:    Reason Eval/Treat Not Completed: Fatigue/lethargy limiting ability to participate (Patient declined PT today. Will continue to follow.)  Donna Bernard, PT, MPT  Ina Homes 10/29/2022, 3:17 PM

## 2022-10-29 NOTE — Progress Notes (Signed)
[facility]  ROUNDING NOTE   Subjective:   Shawn [name] is a 67 y.o. male with past medical conditions including end stage renal disease on hemodialysis, hypertension, diabetes mellitus type II, congestive heart failure, coronary artery disease, CVA, alcohol abuse, tobacco abuse and hypothyroidism. He presents to the ED with nausea, vomiting and abd pain. Patient has been admitted for DKA (diabetic ketoacidosis) ([name]) [E11.10] Type 1 diabetes mellitus with ketoacidosis without coma ([name]) [E10.10]  Patient is known to our practice and receives outpatient dialysis treatments at [facility] on a MWF schedule, supervised by Dr Thedore Mins.  Patient seen and evaluated during dialysis   HEMODIALYSIS FLOWSHEET:  Blood Flow Rate (mL/min): 400 mL/min Arterial Pressure (mmHg): -203.02 mmHg Venous Pressure (mmHg): 145.45 mmHg TMP (mmHg): 8.89 mmHg Ultrafiltration Rate (mL/min): 1080 mL/min Dialysate Flow Rate (mL/min): 300 ml/min  No complaints to offer Alert and oriented  Objective:  Vital signs in last 24 hours:  Temp:  [97.7 F (36.5 C)-98.5 F (36.9 C)] 97.7 F (36.5 C) (10/25 0748) Pulse Rate:  [57-67] 62 (10/25 1130) Resp:  [13-20] 17 (10/25 1130) BP: (93-128)/(59-85) 126/78 (10/25 1130) SpO2:  [89 %-100 %] 100 % (10/25 1130) FiO2 (%):  [28 %] 28 % (10/24 2000) Weight:  [63 kg-66.6 kg] 63 kg (10/25 0748)  Weight change: 0.828 kg Filed Weights   10/27/22 1153 10/29/22 0500 10/29/22 0748  Weight: 65.8 kg 66.6 kg 63 kg    Intake/Output: I/O last 3 completed shifts: In: 1144.1 [P.O.:300; I.V.:844.1] Out: 507 [Urine:200]   Intake/Output this shift:  No intake/output data recorded.  Physical Exam: General: Ill-appearing  Head: Normocephalic, atraumatic. Moist oral mucosal membranes  Eyes: Anicteric  Lungs:  Shallow breaths, room air  Heart: Regular rate and rhythm  Abdomen:  Soft, nontender,   Extremities: 1+ peripheral edema.  Neurologic: Alert  moving all four extremities  Skin: No lesions  Access: Right PermCath    Basic Metabolic Panel: Recent Labs  Lab 10/27/22 1837 10/27/22 2218 10/28/22 0305 10/28/22 0558 10/29/22 0811  NA 134* 136 135 134* 135  K 4.7 4.7 4.7 4.7 4.6  CL 100 100 103 101 103  CO2 14* 16* 20* 21* 21*  GLUCOSE 298* 167* 75 102* 56*  BUN 100* 100* 91* 81* 80*  CREATININE 7.63* 7.72* 6.13* 5.40* 5.76*  CALCIUM 8.1* 8.2* 7.6* 7.7* 8.1*  MG  --   --   --  2.2  --   PHOS  --   --  5.8*  --  5.8*    Liver Function Tests: Recent Labs  Lab 10/27/22 1155 10/28/22 0305 10/29/22 0811  AST 21  --   --   ALT 13  --   --   ALKPHOS 92  --   --   BILITOT 1.3*  --   --   PROT 6.5  --   --   ALBUMIN 2.9* 2.7* 2.6*   Recent Labs  Lab 10/27/22 1155  LIPASE 20   No results for input(s): "AMMONIA" in the last 168 hours.  CBC: Recent Labs  Lab 10/24/22 0708 10/27/22 1155 10/27/22 1525 10/28/22 0558 10/29/22 0502  WBC 2.9* 7.0 7.4 5.5 6.2  NEUTROABS  --   --  6.3  --   --   HGB 11.5* 9.8* 9.6* 9.3* 9.4*  HCT 36.9* 31.0* 30.8* 28.1* 29.2*  MCV 95.1 95.4 97.5 91.5 93.0  PLT 186 217 222 194 193    Cardiac Enzymes: No results for input(s): "CKTOTAL", "CKMB", "CKMBINDEX", "  TROPONINI" in the last 168 hours.  BNP: Invalid input(s): "POCBNP"  CBG: Recent Labs  Lab 10/28/22 1608 10/28/22 2104 10/29/22 0905 10/29/22 0925 10/29/22 1023  GLUCAP 92 84 63* 67* 97    Microbiology: Results for orders placed or performed during the hospital encounter of 10/27/22  SARS Coronavirus 2 by RT PCR (hospital order, performed in [facility] hospital lab) *cepheid single result test* Anterior Nasal Swab     Status: None   Collection Time: 10/27/22  6:37 PM   Specimen: Anterior Nasal Swab  Result Value Ref Range Status   SARS Coronavirus 2 by RT PCR NEGATIVE NEGATIVE Final    Comment: (NOTE) SARS-CoV-2 target nucleic acids are NOT DETECTED.  The SARS-CoV-2 RNA is generally detectable in upper and  lower respiratory specimens during the acute phase of infection. The lowest concentration of SARS-CoV-2 viral copies this assay can detect is 250 copies / mL. A negative result does not preclude SARS-CoV-2 infection and should not be used as the sole basis for treatment or other patient management decisions.  A negative result may occur with improper specimen collection / handling, submission of specimen other than nasopharyngeal swab, presence of viral mutation(s) within the areas targeted by this assay, and inadequate number of viral copies (<250 copies / mL). A negative result must be combined with clinical observations, patient history, and epidemiological information.  Fact Sheet for Patients:   RoadLapTop.co.za  Fact Sheet for Healthcare Providers: http://kim-miller.com/  This test is not yet approved or  cleared by the [place] FDA and has been authorized for detection and/or diagnosis of SARS-CoV-2 by FDA under an Emergency Use Authorization (EUA).  This EUA will remain in effect (meaning this test can be used) for the duration of the COVID-19 declaration under Section 564(b)(1) of the Act, 21 U.S.C. section 360bbb-3(b)(1), unless the authorization is terminated or revoked sooner.  Performed at [facility], [address] [place], [place] [address]   MRSA Next Gen by PCR, Nasal     Status: None   Collection Time: 10/27/22  9:04 PM   Specimen: Nasal Mucosa; Nasal Swab  Result Value Ref Range Status   MRSA by PCR Next Gen NOT DETECTED NOT DETECTED Final    Comment: (NOTE) The GeneXpert MRSA Assay (FDA approved for NASAL specimens only), is one component of a comprehensive MRSA colonization surveillance program. It is not intended to diagnose MRSA infection nor to guide or monitor treatment for MRSA infections. Test performance is not FDA approved in patients less than 43 years old. Performed at [facility], [address] [place], [place] [address]   Culture, blood (Routine X 2) w Reflex to ID Panel     Status: None (Preliminary result)   Collection Time: 10/28/22  9:38 AM   Specimen: BLOOD RIGHT HAND  Result Value Ref Range Status   Specimen Description BLOOD RIGHT HAND  Final   Special Requests   Final    BOTTLES DRAWN AEROBIC ONLY Blood Culture adequate volume   Culture   Final    NO GROWTH < 24 HOURS Performed at [facility], [address] [place], [place] [address]    Report Status PENDING  Incomplete  Culture, blood (Routine X 2) w Reflex to ID Panel     Status: None (Preliminary result)   Collection Time: 10/28/22 10:50 AM   Specimen: BLOOD  Result Value Ref Range Status   Specimen Description BLOOD RIGHT HAND  Final   Special Requests  Final    BOTTLES DRAWN AEROBIC AND ANAEROBIC Blood Culture results may not be optimal due to an inadequate volume of blood received in culture bottles   Culture   Final    NO GROWTH < 24 HOURS Performed at [facility], [address] [place], [place] [address]    Report Status PENDING  Incomplete    Coagulation Studies: No results for input(s): "LABPROT", "INR" in the last 72 hours.  Urinalysis: No results for input(s): "COLORURINE", "LABSPEC", "PHURINE", "GLUCOSEU", "HGBUR", "BILIRUBINUR", "KETONESUR", "PROTEINUR", "UROBILINOGEN", "NITRITE", "LEUKOCYTESUR" in the last 72 hours.  Invalid input(s): "APPERANCEUR"    Imaging: DG Chest Portable 1 View  Result Date: 10/27/2022 CLINICAL DATA:  Nausea/vomiting/abdominal pain EXAM: PORTABLE CHEST - 1 VIEW COMPARISON:  09/03/2022 FINDINGS: Stable right IJ hemodialysis catheter to the proximal right atrium. Resolution of the interstitial edema seen previously. Left retrocardiac consolidation/atelectasis. Stable borderline cardiomegaly. No effusion. Visualized bones unremarkable. IMPRESSION: Left retrocardiac consolidation/atelectasis. Electronically Signed    By: Corlis Leak M.D.   On: 10/27/2022 16:51     Medications:      atorvastatin  40 mg Oral QHS   Chlorhexidine Gluconate Cloth  6 each Topical Daily   feeding supplement  237 mL Oral TID BM   insulin aspart  0-5 Units Subcutaneous QHS   insulin aspart  0-9 Units Subcutaneous TID WC   insulin glargine-yfgn  5 Units Subcutaneous Daily   levothyroxine  75 mcg Oral QAC breakfast   multivitamin  1 tablet Per Tube QHS   nicotine  14 mg Transdermal Daily   pantoprazole (PROTONIX) IV  40 mg Intravenous Q12H   sodium chloride flush  10 mL Intravenous Q12H   alteplase, dextrose, heparin, heparin  Assessment/ Plan:  [name] is a 67 y.o.  male with end stage renal disease on hemodialysis, hypertension, diabetes mellitus type II, congestive heart failure, coronary artery disease, CVA, alcohol abuse, tobacco abuse and hypothyroidism   End-stage renal disease with hyperkalemia on hemodialysis.  Patient has missed multiple treatments over the past week.   Receiving dialysis today, UF goal 1.5-2L as tolerated. Next treatment scheduled for Monday.  2. Diabetes mellitus type II with chronic kidney disease with diabetic ketoacidosis: Insulin-dependent with Lantus.  Blood sugar 436 on admission.   Glucose stable and insulin drip stopped. Mild hypoglycemia this morning in dialysis. Corrected with juice and breakfast.   3.  Acute metabolic acidosis.  Serum bicarb 10 on admission.  Corrected   4. Anemia of chronic kidney disease Lab Results  Component Value Date   HGB 9.4 (L) 10/29/2022    Hemoglobin still within optimal range.  Patient receives Mircera at outpatient clinic.  Will continue to monitor for need of ESA during this admission.  5.  Hypertension with chronic kidney disease.  Home regimen includes carvedilol and furosemide.  Both currently held  Blood pressure during dialysis. 126/76   LOS: 2 Kim Lauver 10/25/202411:36 AM

## 2022-10-29 NOTE — Progress Notes (Signed)
0900: Blood glucose from Renal Panel is 56. Patient is fully awake. Blood glucose is 63 via finger stick. Apple juice given. After 15 mins, blood glucose was 67. Patient ate his breakfast and repeat blood glucose was 97.

## 2022-10-29 NOTE — Progress Notes (Signed)
Hemodialysis note  Received patient in bed to unit. Alert and oriented.  Informed consent signed and in chart.  Treatment initiated: 0810 Treatment completed: 1145  Patient tolerated well. Transported back to room, alert without acute distress.  Report given to patient's RN.   Access used: Right Chest HD Catheter Access issues: none  Total UF removed: 2L Medication(s) given:  none  Post HD weight: 61.3 kg   [name]

## 2022-10-29 NOTE — Progress Notes (Signed)
Progress Note   Patient: Shawn [name] UEA:[id number] DOB: 07-Jun-1955 DOA: 10/27/2022     2 DOS: the patient was seen and examined on 10/29/2022   Brief hospital course:  Shawn [name] is a 67 y.o. male with medical history significant of HFrEF, ESRD on hemodialysis Monday Wednesday Friday, substance abuse, history of CVA presenting with DKA, missed hemodialysis.  Patient reports approximate 2 days of intractable nausea and vomiting.  Emesis predominant nonbloody nonbilious.?  Trace blood.  No recent sick contacts.  No recent changes of fluid.  Patient states he stopped taking his insulin roughly 1 week ago.  Per report, patient has been overall fatigued.  Has been relatively compliant with hemodialysis.  Missed dialysis today secondary to lethargy.  No abdominal pain.  Mild diarrhea.  No chest pain or shortness of breath.  No focal hemiparesis or confusion.  Still smoking 1/4 to 1/2 pack/day.  Denies any active alcohol or substance abuse     Assessment and Plan:    Active Problems:   Chronic HFrEF (heart failure with reduced ejection fraction) ([name])   ESRD on dialysis ([name])   T2DM (type 2 diabetes mellitus) ([name])   Essential hypertension   Smoker   Alcohol abuse   Diabetic ketoacidosis without coma associated with type 2 diabetes mellitus ([name])   # DKA Resolved Presented with episodes of nausea, vomiting and abdominal pain most likely related to DKA Presented with hyperglycemia, elevated gap, elevated beta-h.  Placed on judicious fluids and insulin gtt. This appears to be 2/2 noncompliance as patient hadn't taken insulin in a week. Noted to have episodes of hypoglycemia Discontinued nighttime insulin coverage as well as sliding scale Continue Semglee 5 units daily Maintain consistent carbohydrate diet    # Debility Seen by physical therapy No needs    # Hyperkalemia 2/2 missed dialysis.  Resolved      # Hematemesis? Sister says she noted some streaks of  blood in emesis.  No hematemesis here and hgb stable from priors (9s) so relatively low concern for ongoing bleeding, suspect something like MW tear. Continue bid ppi Will monitor closely, low threshold to involve gi     # HFrEF EF last year 25-30.  Hasn't seen cardiology for several years.  Continue renal replacement therapy for volume status management Resume carvedilol but at a low dose since patient is normotensive    # History CVA Continue statin Resume aspirin    # Hypothyroid Continue synthroid    # BPH Continue flomax    Severe malnutrition secondary to chronic illness (BMI 18) Intervention Ensure Enlive po TID, each supplement provides 350 kcal and 20 grams of protein.   Magic cup TID with meals, each supplement provides 290 kcal and 9 grams of protein   Rena-vit po daily    Liberalize diet    Pt at high refeed risk; recommend monitor potassium, magnesium and phosphorus labs daily until stable   Daily weights      Subjective: Patient is seen and examined in the dialysis unit.  No new complaints  Physical Exam: Vitals:   10/29/22 1100 10/29/22 1130 10/29/22 1145 10/29/22 1155  BP: 122/76 126/78 126/76   Pulse: 64 62 63   Resp: 16 17 14    Temp:      TempSrc:   Oral   SpO2: 100% 100% 100%   Weight:    61.3 kg  Height:       General exam: Appears calm and comfortable, chronically ill appearing Respiratory system: Clear  to auscultation save for rales at bases, normal sob Cardiovascular system: S1 & S2 heard, RRR. Distant heart sounds, soft systolic murmur Gastrointestinal system: Abdomen is nondistended, soft and nontender.   Central nervous system: Alert and oriented. No focal neurological deficits. Extremities: warm, trace edema Skin: No visible rashes, lesions or ulcers Psychiatry: appears somewhat demented   Data Reviewed: Labs reviewed.  Blood glucose 56, There are no new results to review at this time.  Family Communication: Plan of  care discussed with patient.  Disposition: Status is: Inpatient Remains inpatient appropriate because: Stabilize blood sugar  Planned Discharge Destination: Home with Home Health    Time spent: 35  minutes  Author: Lucile Shutters, MD 10/29/2022 3:41 PM  For on call review [email or web address].

## 2022-10-30 DIAGNOSIS — E1110 Type 2 diabetes mellitus with ketoacidosis without coma: Secondary | ICD-10-CM

## 2022-10-30 LAB — CBC
HCT: 30.6 % — ABNORMAL LOW (ref 39.0–52.0)
Hemoglobin: 9.7 g/dL — ABNORMAL LOW (ref 13.0–17.0)
MCH: 29.8 pg (ref 26.0–34.0)
MCHC: 31.7 g/dL (ref 30.0–36.0)
MCV: 93.9 fL (ref 80.0–100.0)
Platelets: 191 10*3/uL (ref 150–400)
RBC: 3.26 MIL/uL — ABNORMAL LOW (ref 4.22–5.81)
RDW: 15.9 % — ABNORMAL HIGH (ref 11.5–15.5)
WBC: 5 10*3/uL (ref 4.0–10.5)
nRBC: 0 % (ref 0.0–0.2)

## 2022-10-30 LAB — GLUCOSE, CAPILLARY
Glucose-Capillary: 357 mg/dL — ABNORMAL HIGH (ref 70–99)
Glucose-Capillary: 534 mg/dL (ref 70–99)
Glucose-Capillary: 418 mg/dL — ABNORMAL HIGH (ref 70–99)
Glucose-Capillary: 367 mg/dL — ABNORMAL HIGH (ref 70–99)
Glucose-Capillary: 352 mg/dL — ABNORMAL HIGH (ref 70–99)
Glucose-Capillary: 346 mg/dL — ABNORMAL HIGH (ref 70–99)
Glucose-Capillary: 218 mg/dL — ABNORMAL HIGH (ref 70–99)

## 2022-10-30 LAB — BASIC METABOLIC PANEL
Anion gap: 16 — ABNORMAL HIGH (ref 5–15)
BUN: 53 mg/dL — ABNORMAL HIGH (ref 8–23)
CO2: 19 mmol/L — ABNORMAL LOW (ref 22–32)
Calcium: 7.8 mg/dL — ABNORMAL LOW (ref 8.9–10.3)
Chloride: 97 mmol/L — ABNORMAL LOW (ref 98–111)
Creatinine, Ser: 4.37 mg/dL — ABNORMAL HIGH (ref 0.61–1.24)
GFR, Estimated: 14 mL/min — ABNORMAL LOW (ref >60)
Glucose, Bld: 378 mg/dL — ABNORMAL HIGH (ref 70–99)
Potassium: 4.2 mmol/L (ref 3.5–5.1)
Sodium: 132 mmol/L — ABNORMAL LOW (ref 135–145)

## 2022-10-30 LAB — HEPATITIS B SURFACE ANTIBODY, QUANTITATIVE: Hep B S AB Quant (Post): 31.7 m[IU]/mL

## 2022-10-30 MED ORDER — INSULIN ASPART 100 UNIT/ML IJ SOLN
5.0000 [IU] | Freq: Once | INTRAMUSCULAR | Status: AC
  Administered 2022-10-30: 5 [IU] via SUBCUTANEOUS
  Filled 2022-10-30: qty 1

## 2022-10-30 MED ORDER — INSULIN ASPART 100 UNIT/ML IJ SOLN
5.0000 [IU] | Freq: Once | INTRAMUSCULAR | Status: AC
  Administered 2022-10-30: 5 [IU] via SUBCUTANEOUS

## 2022-10-30 MED ORDER — LOPERAMIDE HCL 2 MG PO CAPS
2.0000 mg | ORAL_CAPSULE | ORAL | Status: DC | PRN
  Administered 2022-10-30 – 2022-10-31 (×2): 2 mg via ORAL
  Filled 2022-10-30 (×3): qty 1

## 2022-10-30 MED ORDER — INSULIN ASPART 100 UNIT/ML IJ SOLN
8.0000 [IU] | Freq: Once | INTRAMUSCULAR | Status: AC
  Administered 2022-10-30: 8 [IU] via SUBCUTANEOUS
  Filled 2022-10-30: qty 1

## 2022-10-30 MED ORDER — INSULIN ASPART 100 UNIT/ML IJ SOLN
0.0000 [IU] | Freq: Three times a day (TID) | INTRAMUSCULAR | Status: DC
  Administered 2022-10-30: 5 [IU] via SUBCUTANEOUS
  Administered 2022-10-31: 1 [IU] via SUBCUTANEOUS
  Administered 2022-10-31: 3 [IU] via SUBCUTANEOUS
  Administered 2022-11-01: 1 [IU] via SUBCUTANEOUS
  Filled 2022-10-30 (×5): qty 1

## 2022-10-30 NOTE — Progress Notes (Signed)
[facility]  ROUNDING NOTE   Subjective:   Shawn [name] is a 67 y.o. male with past medical conditions including end stage renal disease on hemodialysis, hypertension, diabetes mellitus type II, congestive heart failure, coronary artery disease, CVA, alcohol abuse, tobacco abuse and hypothyroidism. He presents to the ED with nausea, vomiting and abd pain. Patient has been admitted for DKA (diabetic ketoacidosis) ([name]) [E11.10] Type 1 diabetes mellitus with ketoacidosis without coma ([name]) [E10.10]  Patient is known to our practice and receives outpatient dialysis treatments at [facility] on a MWF schedule, supervised by Dr Thedore Mins.  Patient sitting up in bed Alert Eating peanut butter graham crackers Denies nausea  Objective:  Vital signs in last 24 hours:  Temp:  [97.3 F (36.3 C)-98.2 F (36.8 C)] 97.3 F (36.3 C) (10/26 0756) Pulse Rate:  [63-76] 76 (10/26 0756) Resp:  [14-20] 14 (10/26 0756) BP: (126-132)/(76-81) 132/76 (10/26 0756) SpO2:  [90 %-100 %] 90 % (10/26 0756) Weight:  [61.3 kg] 61.3 kg (10/25 1155)  Weight change: -3.6 kg Filed Weights   10/29/22 0500 10/29/22 0748 10/29/22 1155  Weight: 66.6 kg 63 kg 61.3 kg    Intake/Output: I/O last 3 completed shifts: In: 340 [P.O.:340] Out: 2000 [Other:2000]   Intake/Output this shift:  No intake/output data recorded.  Physical Exam: General: Ill-appearing  Head: Normocephalic, atraumatic. Moist oral mucosal membranes  Eyes: Anicteric  Lungs:  Clear to auscultation, room air  Heart: Regular rate and rhythm  Abdomen:  Soft, nontender  Extremities: Trace peripheral edema.  Neurologic: Alert moving all four extremities  Skin: No lesions  Access: Right PermCath    Basic Metabolic Panel: Recent Labs  Lab 10/27/22 2218 10/28/22 0305 10/28/22 0558 10/29/22 0811 10/30/22 1012  NA 136 135 134* 135 132*  K 4.7 4.7 4.7 4.6 4.2  CL 100 103 101 103 97*  CO2 16* 20* 21* 21* 19*   GLUCOSE 167* 75 102* 56* 378*  BUN 100* 91* 81* 80* 53*  CREATININE 7.72* 6.13* 5.40* 5.76* 4.37*  CALCIUM 8.2* 7.6* 7.7* 8.1* 7.8*  MG  --   --  2.2  --   --   PHOS  --  5.8*  --  5.8*  --     Liver Function Tests: Recent Labs  Lab 10/27/22 1155 10/28/22 0305 10/29/22 0811  AST 21  --   --   ALT 13  --   --   ALKPHOS 92  --   --   BILITOT 1.3*  --   --   PROT 6.5  --   --   ALBUMIN 2.9* 2.7* 2.6*   Recent Labs  Lab 10/27/22 1155  LIPASE 20   No results for input(s): "AMMONIA" in the last 168 hours.  CBC: Recent Labs  Lab 10/27/22 1155 10/27/22 1525 10/28/22 0558 10/29/22 0502 10/30/22 0639  WBC 7.0 7.4 5.5 6.2 5.0  NEUTROABS  --  6.3  --   --   --   HGB 9.8* 9.6* 9.3* 9.4* 9.7*  HCT 31.0* 30.8* 28.1* 29.2* 30.6*  MCV 95.4 97.5 91.5 93.0 93.9  PLT 217 222 194 193 191    Cardiac Enzymes: No results for input(s): "CKTOTAL", "CKMB", "CKMBINDEX", "TROPONINI" in the last 168 hours.  BNP: Invalid input(s): "POCBNP"  CBG: Recent Labs  Lab 10/29/22 0925 10/29/22 1023 10/29/22 1607 10/29/22 2142 10/30/22 0803  GLUCAP 67* 97 157* 183* 357*    Microbiology: Results for orders placed or performed during the hospital encounter  of 10/27/22  SARS Coronavirus 2 by RT PCR (hospital order, performed in [facility] hospital lab) *cepheid single result test* Anterior Nasal Swab     Status: None   Collection Time: 10/27/22  6:37 PM   Specimen: Anterior Nasal Swab  Result Value Ref Range Status   SARS Coronavirus 2 by RT PCR NEGATIVE NEGATIVE Final    Comment: (NOTE) SARS-CoV-2 target nucleic acids are NOT DETECTED.  The SARS-CoV-2 RNA is generally detectable in upper and lower respiratory specimens during the acute phase of infection. The lowest concentration of SARS-CoV-2 viral copies this assay can detect is 250 copies / mL. A negative result does not preclude SARS-CoV-2 infection and should not be used as the sole basis for treatment or other patient  management decisions.  A negative result may occur with improper specimen collection / handling, submission of specimen other than nasopharyngeal swab, presence of viral mutation(s) within the areas targeted by this assay, and inadequate number of viral copies (<250 copies / mL). A negative result must be combined with clinical observations, patient history, and epidemiological information.  Fact Sheet for Patients:   RoadLapTop.co.za  Fact Sheet for Healthcare Providers: http://kim-miller.com/  This test is not yet approved or  cleared by the [place] FDA and has been authorized for detection and/or diagnosis of SARS-CoV-2 by FDA under an Emergency Use Authorization (EUA).  This EUA will remain in effect (meaning this test can be used) for the duration of the COVID-19 declaration under Section 564(b)(1) of the Act, 21 U.S.C. section 360bbb-3(b)(1), unless the authorization is terminated or revoked sooner.  Performed at [facility], [address] [place], [place] [address]   MRSA Next Gen by PCR, Nasal     Status: None   Collection Time: 10/27/22  9:04 PM   Specimen: Nasal Mucosa; Nasal Swab  Result Value Ref Range Status   MRSA by PCR Next Gen NOT DETECTED NOT DETECTED Final    Comment: (NOTE) The GeneXpert MRSA Assay (FDA approved for NASAL specimens only), is one component of a comprehensive MRSA colonization surveillance program. It is not intended to diagnose MRSA infection nor to guide or monitor treatment for MRSA infections. Test performance is not FDA approved in patients less than 20 years old. Performed at [facility], [address] [place], [place] [address]   Culture, blood (Routine X 2) w Reflex to ID Panel     Status: None (Preliminary result)   Collection Time: 10/28/22  9:38 AM   Specimen: BLOOD RIGHT HAND  Result Value Ref Range Status   Specimen Description BLOOD RIGHT HAND  Final    Special Requests   Final    BOTTLES DRAWN AEROBIC ONLY Blood Culture adequate volume   Culture   Final    NO GROWTH 2 DAYS Performed at [facility], [address] [place], [place] [address]    Report Status PENDING  Incomplete  Culture, blood (Routine X 2) w Reflex to ID Panel     Status: None (Preliminary result)   Collection Time: 10/28/22 10:50 AM   Specimen: BLOOD  Result Value Ref Range Status   Specimen Description BLOOD RIGHT HAND  Final   Special Requests   Final    BOTTLES DRAWN AEROBIC AND ANAEROBIC Blood Culture results may not be optimal due to an inadequate volume of blood received in culture bottles   Culture   Final    NO GROWTH 2 DAYS Performed at [facility], [address] [place], [place] [address]    Report Status PENDING  Incomplete    Coagulation Studies: No results for input(s): "LABPROT", "INR" in the last 72 hours.  Urinalysis: No results for input(s): "COLORURINE", "LABSPEC", "PHURINE", "GLUCOSEU", "HGBUR", "BILIRUBINUR", "KETONESUR", "PROTEINUR", "UROBILINOGEN", "NITRITE", "LEUKOCYTESUR" in the last 72 hours.  Invalid input(s): "APPERANCEUR"    Imaging: No results found.   Medications:      aspirin  81 mg Per Tube Daily   atorvastatin  40 mg Per Tube QHS   carvedilol  3.125 mg Per Tube BID WC   Chlorhexidine Gluconate Cloth  6 each Topical Daily   feeding supplement  237 mL Oral TID BM   insulin aspart  0-6 Units Subcutaneous TID WC   insulin glargine-yfgn  5 Units Subcutaneous Daily   levothyroxine  75 mcg Per Tube QAC breakfast   multivitamin  1 tablet Per Tube QHS   nicotine  14 mg Transdermal Daily   pantoprazole (PROTONIX) IV  40 mg Intravenous Q12H   sodium chloride flush  10 mL Intravenous Q12H   dextrose  Assessment/ Plan:  [name] is a 67 y.o.  male with end stage renal disease on hemodialysis, hypertension, diabetes mellitus type II, congestive heart failure, coronary artery disease,  CVA, alcohol abuse, tobacco abuse and hypothyroidism   End-stage renal disease with hyperkalemia on hemodialysis.  Patient has missed multiple treatments over the past week.   Dialysis received yesterday with UF2L . Next treatment scheduled for Monday.  2. Diabetes mellitus type II with chronic kidney disease with diabetic ketoacidosis: Insulin-dependent with Lantus.  Blood sugar 436 on admission.   Glucose continues to fluctuate.   3.  Acute metabolic acidosis.  Serum bicarb 10 on admission.  Improved to 19, will continue to monitor.  4. Anemia of chronic kidney disease Lab Results  Component Value Date   HGB 9.7 (L) 10/30/2022    Hemoglobin at goal.  Patient receives Mircera at outpatient clinic.  Will consider low dose EPO with next treatment.   5.  Hypertension with chronic kidney disease.  Home regimen includes carvedilol and furosemide.  Both currently held  Blood pressure stable   LOS: 3 Kaylaann Mountz 10/26/202411:43 AM

## 2022-10-30 NOTE — Progress Notes (Signed)
Progress Note   Patient: Shawn [name]:[id number] DOB: 31-Jan-1955 DOA: 10/27/2022     3 DOS: the patient was seen and examined on 10/30/2022   Brief hospital course:  BERGER STAVER is a 67 y.o. male with medical history significant of HFrEF, ESRD on hemodialysis Monday Wednesday Friday, substance abuse, history of CVA presenting with DKA, missed hemodialysis.  Patient reports approximate 2 days of intractable nausea and vomiting.  Emesis predominant nonbloody nonbilious.?  Trace blood.  No recent sick contacts.  No recent changes of fluid.  Patient states he stopped taking his insulin roughly 1 week ago.  Per report, patient has been overall fatigued.  Has been relatively compliant with hemodialysis.  Missed dialysis today secondary to lethargy.  No abdominal pain.  Mild diarrhea.  No chest pain or shortness of breath.  No focal hemiparesis or confusion.  Still smoking 1/4 to 1/2 pack/day.  Denies any active alcohol or substance abuse     Assessment and Plan:  Active Problems:   Chronic HFrEF (heart failure with reduced ejection fraction) ([name])   ESRD on dialysis ([name])   T2DM (type 2 diabetes mellitus) ([name])   Essential hypertension   Smoker   Alcohol abuse   Diabetic ketoacidosis without coma associated with type 2 diabetes mellitus ([name])   # DKA Resolved Presented with episodes of nausea, vomiting and abdominal pain most likely related to DKA Presented with hyperglycemia, elevated gap, elevated beta-h.  Placed on judicious fluids and insulin gtt. This appears to be 2/2 noncompliance as patient hadn't taken insulin in a week. Noted to have episodes of hypoglycemia and his sliding scale insulin was held Patient now has hyperglycemia Continue Semglee 5 units daily Resume sliding scale coverage Maintain consistent carbohydrate diet      # Debility Seen by physical therapy No needs     # Hyperkalemia 2/2 missed dialysis.  Resolved       #  Hematemesis? Sister says she noted some streaks of blood in emesis.  No hematemesis here and hgb stable from priors (9s) so relatively low concern for ongoing bleeding, suspect something like MW tear. Continue bid ppi        # HFrEF EF last year 25-30.  Hasn't seen cardiology for several years.  Continue renal replacement therapy for volume status management Resume carvedilol but at a low dose since patient is normotensive     # History CVA Continue statin Resume aspirin     # Hypothyroid Continue synthroid     # BPH Continue flomax       Severe malnutrition secondary to chronic illness (BMI 18) Intervention Ensure Enlive po TID, each supplement provides 350 kcal and 20 grams of protein.   Magic cup TID with meals, each supplement provides 290 kcal and 9 grams of protein   Rena-vit po daily    Liberalize diet    Pt at high refeed risk; recommend monitor potassium, magnesium and phosphorus labs daily until stable   Daily weights        Subjective: Patient is seen and examined at the bedside.  No new complaints  Physical Exam: Vitals:   10/29/22 2002 10/30/22 0339 10/30/22 0756 10/30/22 1212  BP: 130/78 131/78 132/76 138/79  Pulse: 70 75 76 88  Resp: 20 20 14 16   Temp: 98.2 F (36.8 C)  (!) 97.3 F (36.3 C) 97.7 F (36.5 C)  TempSrc:   Oral   SpO2: 95% 93% 90% 91%  Weight:  Height:       General exam: Appears calm and comfortable, chronically ill appearing Respiratory system: Clear to auscultation expect for rales at bases, normal sob Cardiovascular system: S1 & S2 heard, RRR. Distant heart sounds, soft systolic murmur Gastrointestinal system: Abdomen is nondistended, soft and nontender.   Central nervous system: Alert and oriented. No focal neurological deficits. Extremities: warm, trace edema Skin: No visible rashes, lesions or ulcers Psychiatry: appears somewhat demented     Data Reviewed: Labs reviewed.  Sodium 132, potassium 4.2,  creatinine 4.37, BUN 53, glucose 534 There are no new results to review at this time.  Family Communication: Plan of care discussed with patient in detail  Disposition: Status is: Inpatient Remains inpatient appropriate because: Optimize glycemic control  Planned Discharge Destination: Home with Home Health    Time spent: 35 minutes  Author: Lucile Shutters, MD 10/30/2022 2:03 PM  For on call review [email or web address].

## 2022-10-30 NOTE — Progress Notes (Signed)
Patient states that he is unable to find his wallet ( black leather)  or cell phone (android). Sister states that she tried to call the cell phone but it is off. This nurse checked patients belongings and bag that he stated it was in, did not find the items. After reading through the chart. Patients wallet and cell phone were sent to security upon admission. Patient aware that his belongings are with security and locked up.

## 2022-10-30 NOTE — Plan of Care (Signed)
  Problem: Fluid Volume: Goal: Hemodynamic stability will improve Outcome: Progressing   Problem: Respiratory: Goal: Ability to maintain adequate ventilation will improve Outcome: Progressing   Problem: Fluid Volume: Goal: Ability to maintain a balanced intake and output will improve Outcome: Progressing   Problem: Metabolic: Goal: Ability to maintain appropriate glucose levels will improve Outcome: Progressing

## 2022-10-30 NOTE — Plan of Care (Signed)
  Problem: Clinical Measurements: Goal: Diagnostic test results will improve Outcome: Progressing Goal: Signs and symptoms of infection will decrease Outcome: Progressing   Problem: Respiratory: Goal: Ability to maintain adequate ventilation will improve Outcome: Progressing   Problem: Coping: Goal: Ability to adjust to condition or change in health will improve Outcome: Progressing

## 2022-10-31 DIAGNOSIS — E1110 Type 2 diabetes mellitus with ketoacidosis without coma: Secondary | ICD-10-CM | POA: Diagnosis not present

## 2022-10-31 LAB — GLUCOSE, CAPILLARY
Glucose-Capillary: 168 mg/dL — ABNORMAL HIGH (ref 70–99)
Glucose-Capillary: 147 mg/dL — ABNORMAL HIGH (ref 70–99)
Glucose-Capillary: 262 mg/dL — ABNORMAL HIGH (ref 70–99)
Glucose-Capillary: 195 mg/dL — ABNORMAL HIGH (ref 70–99)

## 2022-10-31 MED ORDER — RENA-VITE PO TABS
1.0000 | ORAL_TABLET | Freq: Every day | ORAL | Status: DC
  Administered 2022-10-31: 1 via ORAL
  Filled 2022-10-31 (×2): qty 1

## 2022-10-31 MED ORDER — ATORVASTATIN CALCIUM 20 MG PO TABS
40.0000 mg | ORAL_TABLET | Freq: Every day | ORAL | Status: DC
  Administered 2022-10-31: 40 mg via ORAL
  Filled 2022-10-31: qty 2

## 2022-10-31 MED ORDER — ASPIRIN 81 MG PO CHEW
81.0000 mg | CHEWABLE_TABLET | Freq: Every day | ORAL | Status: DC
  Administered 2022-11-01: 81 mg via ORAL
  Filled 2022-10-31: qty 1

## 2022-10-31 MED ORDER — CARVEDILOL 3.125 MG PO TABS
3.1250 mg | ORAL_TABLET | Freq: Two times a day (BID) | ORAL | Status: DC
  Administered 2022-10-31: 3.125 mg via ORAL
  Filled 2022-10-31: qty 1

## 2022-10-31 MED ORDER — LEVOTHYROXINE SODIUM 50 MCG PO TABS
75.0000 ug | ORAL_TABLET | Freq: Every day | ORAL | Status: DC
  Administered 2022-11-01: 75 ug via ORAL
  Filled 2022-10-31: qty 1

## 2022-10-31 NOTE — Plan of Care (Signed)
  Problem: Fluid Volume: Goal: Hemodynamic stability will improve Outcome: Progressing   Problem: Clinical Measurements: Goal: Diagnostic test results will improve Outcome: Progressing   Problem: Respiratory: Goal: Ability to maintain adequate ventilation will improve Outcome: Progressing   Problem: Coping: Goal: Ability to adjust to condition or change in health will improve Outcome: Progressing   Problem: Fluid Volume: Goal: Ability to maintain a balanced intake and output will improve Outcome: Progressing

## 2022-10-31 NOTE — Plan of Care (Signed)
  Problem: Fluid Volume: Goal: Hemodynamic stability will improve Outcome: Progressing   Problem: Clinical Measurements: Goal: Diagnostic test results will improve Outcome: Progressing Goal: Signs and symptoms of infection will decrease Outcome: Progressing   Problem: Respiratory: Goal: Ability to maintain adequate ventilation will improve Outcome: Progressing   Problem: Health Behavior/Discharge Planning: Goal: Ability to manage health-related needs will improve Outcome: Progressing   Problem: Nutritional: Goal: Maintenance of adequate nutrition will improve Outcome: Progressing   Problem: Skin Integrity: Goal: Risk for impaired skin integrity will decrease Outcome: Progressing   Problem: Tissue Perfusion: Goal: Adequacy of tissue perfusion will improve Outcome: Progressing   Problem: Fluid Volume: Goal: Ability to achieve a balanced intake and output will improve Outcome: Progressing   Problem: Metabolic: Goal: Ability to maintain appropriate glucose levels will improve Outcome: Progressing   Problem: Nutritional: Goal: Maintenance of adequate nutrition will improve Outcome: Progressing

## 2022-10-31 NOTE — Progress Notes (Signed)
Patient requested wallet and cell phone be brought up from security. Charge nurse contacted security. Wallet and cell phone was taken home by Mertie Clause, patient's sister per patients request.

## 2022-10-31 NOTE — Progress Notes (Signed)
Progress Note   Patient: Shawn [name] NFA:[id number] DOB: 05-15-1955 DOA: 10/27/2022     4 DOS: the patient was seen and examined on 10/31/2022   Brief hospital course:  TASON LARMORE is a 67 y.o. male with medical history significant of HFrEF, ESRD on hemodialysis Monday Wednesday Friday, substance abuse, history of CVA presenting with DKA, missed hemodialysis.  Patient reports approximate 2 days of intractable nausea and vomiting.  Emesis predominant nonbloody nonbilious.?  Trace blood.  No recent sick contacts.  No recent changes of fluid.  Patient states he stopped taking his insulin roughly 1 week ago.  Per report, patient has been overall fatigued.  Has been relatively compliant with hemodialysis.  Missed dialysis today secondary to lethargy.  No abdominal pain.  Mild diarrhea.  No chest pain or shortness of breath.  No focal hemiparesis or confusion.  Still smoking 1/4 to 1/2 pack/day.  Denies any active alcohol or substance abuse     Assessment and Plan:  Active Problems:   Chronic HFrEF (heart failure with reduced ejection fraction) ([name])   ESRD on dialysis ([name])   T2DM (type 2 diabetes mellitus) ([name])   Essential hypertension   Smoker   Alcohol abuse   Diabetic ketoacidosis without coma associated with type 2 diabetes mellitus ([name])   # DKA Resolved Presented with episodes of nausea, vomiting and abdominal pain most likely related to DKA Presented with hyperglycemia, elevated gap, elevated beta-h.  Placed on judicious fluids and insulin gtt. This appears to be 2/2 noncompliance as patient hadn't taken insulin in a week. Noted to have fluctuations in his blood sugars and has had episodes of hypo and hyperglycemia Continue Semglee 5 units daily Continue sliding scale coverage Maintain consistent carbohydrate diet       # Debility Seen by physical therapy No needs     # Hyperkalemia 2/2 missed dialysis.  Resolved       # Hematemesis? Ruled out Sister  says she noted some streaks of blood in emesis.  No hematemesis here and hgb stable from priors (9s) so relatively low concern for ongoing bleeding, suspect something like MW tear. Continue bid ppi H&H is stable         # HFrEF EF last year 25-30.  Hasn't seen cardiology for several years.  Continue renal replacement therapy for volume status management Resume carvedilol but at a low dose since patient is normotensive     # History CVA Continue statin Resume aspirin     # Hypothyroid Continue synthroid     # BPH Continue flomax       Severe malnutrition secondary to chronic illness (BMI 18) Intervention Ensure Enlive po TID, each supplement provides 350 kcal and 20 grams of protein.   Magic cup TID with meals, each supplement provides 290 kcal and 9 grams of protein   Rena-vit po daily    Liberalize diet    Pt at high refeed risk; recommend monitor potassium, magnesium and phosphorus labs daily until stable   Daily weights           Subjective: Patient is seen and examined at the bedside.  No new complaints  Physical Exam: Vitals:   10/30/22 2003 10/31/22 0002 10/31/22 0551 10/31/22 1149  BP: 130/78 124/70 122/80 124/77  Pulse: 70 76 67 67  Resp: 16 16 18 16   Temp: 98.2 F (36.8 C) 98.2 F (36.8 C) 98.2 F (36.8 C)   TempSrc: Oral  Oral   SpO2: 97% 96% 100%  95%  Weight:      Height:       General exam: Appears calm and comfortable, chronically ill appearing Respiratory system: Clear to auscultation, normal sob Cardiovascular system: S1 & S2 heard, RRR. Distant heart sounds, soft systolic murmur Gastrointestinal system: Abdomen is nondistended, soft and nontender.   Central nervous system: Alert and oriented. No focal neurological deficits. Extremities: warm, trace edema Skin: No visible rashes, lesions or ulcers Psychiatry: appears somewhat demented   Data Reviewed:  There are no new results to review at this time.  Family Communication:  Plan of care discussed with patient in detail.  He verbalizes understanding and agrees with the plan.  Disposition: Status is: Inpatient Remains inpatient appropriate because: Discharge planning  Planned Discharge Destination: Home    Time spent: 33 minutes  Author: Lucile Shutters, MD 10/31/2022 1:03 PM  For on call review [email or web address].

## 2022-10-31 NOTE — Progress Notes (Signed)
[facility]  ROUNDING NOTE   Subjective:   Shawn [name] is a 67 y.o. male with past medical conditions including end stage renal disease on hemodialysis, hypertension, diabetes mellitus type II, congestive heart failure, coronary artery disease, CVA, alcohol abuse, tobacco abuse and hypothyroidism. He presents to the ED with nausea, vomiting and abd pain. Patient has been admitted for DKA (diabetic ketoacidosis) ([name]) [E11.10] Type 1 diabetes mellitus with ketoacidosis without coma ([name]) [E10.10]  Patient is known to our practice and receives outpatient dialysis treatments at [facility] on a MWF schedule, supervised by Dr Thedore Mins.  Patient seen sitting up in bed, currently eating breakfast Alert and oriented States he would now prefer to discharge home instead of rehab Reports important eye appointment that will improve his vision  Objective:  Vital signs in last 24 hours:  Temp:  [97.6 F (36.4 C)-98.2 F (36.8 C)] 98.2 F (36.8 C) (10/27 0551) Pulse Rate:  [67-76] 67 (10/27 1149) Resp:  [16-18] 16 (10/27 1149) BP: (122-133)/(70-99) 124/77 (10/27 1149) SpO2:  [95 %-100 %] 95 % (10/27 1149)  Weight change:  Filed Weights   10/29/22 0500 10/29/22 0748 10/29/22 1155  Weight: 66.6 kg 63 kg 61.3 kg    Intake/Output: I/O last 3 completed shifts: In: 440 [P.O.:440] Out: 2 [Stool:2]   Intake/Output this shift:  No intake/output data recorded.  Physical Exam: General: NAD  Head: Normocephalic, atraumatic. Moist oral mucosal membranes  Eyes: Anicteric  Lungs:  Clear to auscultation, room air  Heart: Regular rate and rhythm  Abdomen:  Soft, nontender  Extremities: No peripheral edema.  Neurologic: Alert moving all four extremities  Skin: No lesions  Access: Right PermCath    Basic Metabolic Panel: Recent Labs  Lab 10/27/22 2218 10/28/22 0305 10/28/22 0558 10/29/22 0811 10/30/22 1012  NA 136 135 134* 135 132*  K 4.7 4.7 4.7 4.6 4.2  CL  100 103 101 103 97*  CO2 16* 20* 21* 21* 19*  GLUCOSE 167* 75 102* 56* 378*  BUN 100* 91* 81* 80* 53*  CREATININE 7.72* 6.13* 5.40* 5.76* 4.37*  CALCIUM 8.2* 7.6* 7.7* 8.1* 7.8*  MG  --   --  2.2  --   --   PHOS  --  5.8*  --  5.8*  --     Liver Function Tests: Recent Labs  Lab 10/27/22 1155 10/28/22 0305 10/29/22 0811  AST 21  --   --   ALT 13  --   --   ALKPHOS 92  --   --   BILITOT 1.3*  --   --   PROT 6.5  --   --   ALBUMIN 2.9* 2.7* 2.6*   Recent Labs  Lab 10/27/22 1155  LIPASE 20   No results for input(s): "AMMONIA" in the last 168 hours.  CBC: Recent Labs  Lab 10/27/22 1155 10/27/22 1525 10/28/22 0558 10/29/22 0502 10/30/22 0639  WBC 7.0 7.4 5.5 6.2 5.0  NEUTROABS  --  6.3  --   --   --   HGB 9.8* 9.6* 9.3* 9.4* 9.7*  HCT 31.0* 30.8* 28.1* 29.2* 30.6*  MCV 95.4 97.5 91.5 93.0 93.9  PLT 217 222 194 193 191    Cardiac Enzymes: No results for input(s): "CKTOTAL", "CKMB", "CKMBINDEX", "TROPONINI" in the last 168 hours.  BNP: Invalid input(s): "POCBNP"  CBG: Recent Labs  Lab 10/30/22 1603 10/30/22 1823 10/30/22 2111 10/31/22 0809 10/31/22 1153  GLUCAP 352* 346* 218* 168* 147*    Microbiology:  Results for orders placed or performed during the hospital encounter of 10/27/22  SARS Coronavirus 2 by RT PCR (hospital order, performed in [facility] hospital lab) *cepheid single result test* Anterior Nasal Swab     Status: None   Collection Time: 10/27/22  6:37 PM   Specimen: Anterior Nasal Swab  Result Value Ref Range Status   SARS Coronavirus 2 by RT PCR NEGATIVE NEGATIVE Final    Comment: (NOTE) SARS-CoV-2 target nucleic acids are NOT DETECTED.  The SARS-CoV-2 RNA is generally detectable in upper and lower respiratory specimens during the acute phase of infection. The lowest concentration of SARS-CoV-2 viral copies this assay can detect is 250 copies / mL. A negative result does not preclude SARS-CoV-2 infection and should not be used as the  sole basis for treatment or other patient management decisions.  A negative result may occur with improper specimen collection / handling, submission of specimen other than nasopharyngeal swab, presence of viral mutation(s) within the areas targeted by this assay, and inadequate number of viral copies (<250 copies / mL). A negative result must be combined with clinical observations, patient history, and epidemiological information.  Fact Sheet for Patients:   RoadLapTop.co.za  Fact Sheet for Healthcare Providers: http://kim-miller.com/  This test is not yet approved or  cleared by the [place] FDA and has been authorized for detection and/or diagnosis of SARS-CoV-2 by FDA under an Emergency Use Authorization (EUA).  This EUA will remain in effect (meaning this test can be used) for the duration of the COVID-19 declaration under Section 564(b)(1) of the Act, 21 U.S.C. section 360bbb-3(b)(1), unless the authorization is terminated or revoked sooner.  Performed at [facility], [address] [place], [place] [address]   MRSA Next Gen by PCR, Nasal     Status: None   Collection Time: 10/27/22  9:04 PM   Specimen: Nasal Mucosa; Nasal Swab  Result Value Ref Range Status   MRSA by PCR Next Gen NOT DETECTED NOT DETECTED Final    Comment: (NOTE) The GeneXpert MRSA Assay (FDA approved for NASAL specimens only), is one component of a comprehensive MRSA colonization surveillance program. It is not intended to diagnose MRSA infection nor to guide or monitor treatment for MRSA infections. Test performance is not FDA approved in patients less than 37 years old. Performed at [facility], [address] [place], [place] [address]   Culture, blood (Routine X 2) w Reflex to ID Panel     Status: None (Preliminary result)   Collection Time: 10/28/22  9:38 AM   Specimen: BLOOD RIGHT HAND  Result Value Ref Range Status    Specimen Description BLOOD RIGHT HAND  Final   Special Requests   Final    BOTTLES DRAWN AEROBIC ONLY Blood Culture adequate volume   Culture   Final    NO GROWTH 3 DAYS Performed at [facility], [address], [place], [place] [address]    Report Status PENDING  Incomplete  Culture, blood (Routine X 2) w Reflex to ID Panel     Status: None (Preliminary result)   Collection Time: 10/28/22 10:50 AM   Specimen: BLOOD  Result Value Ref Range Status   Specimen Description BLOOD RIGHT HAND  Final   Special Requests   Final    BOTTLES DRAWN AEROBIC AND ANAEROBIC Blood Culture results may not be optimal due to an inadequate volume of blood received in culture bottles   Culture   Final    NO GROWTH  3 DAYS Performed at [facility], [address] [place], [place] [address]    Report Status PENDING  Incomplete    Coagulation Studies: No results for input(s): "LABPROT", "INR" in the last 72 hours.  Urinalysis: No results for input(s): "COLORURINE", "LABSPEC", "PHURINE", "GLUCOSEU", "HGBUR", "BILIRUBINUR", "KETONESUR", "PROTEINUR", "UROBILINOGEN", "NITRITE", "LEUKOCYTESUR" in the last 72 hours.  Invalid input(s): "APPERANCEUR"    Imaging: No results found.   Medications:      [START ON 11/01/2022] aspirin  81 mg Oral Daily   atorvastatin  40 mg Oral QHS   carvedilol  3.125 mg Oral BID WC   Chlorhexidine Gluconate Cloth  6 each Topical Daily   feeding supplement  237 mL Oral TID BM   insulin aspart  0-6 Units Subcutaneous TID WC   insulin glargine-yfgn  5 Units Subcutaneous Daily   [START ON 11/01/2022] levothyroxine  75 mcg Oral QAC breakfast   multivitamin  1 tablet Oral QHS   nicotine  14 mg Transdermal Daily   pantoprazole (PROTONIX) IV  40 mg Intravenous Q12H   sodium chloride flush  10 mL Intravenous Q12H   dextrose, loperamide  Assessment/ Plan:  Mr. GARRETT FRALIX is a 67 y.o.  male with end stage renal disease on hemodialysis,  hypertension, diabetes mellitus type II, congestive heart failure, coronary artery disease, CVA, alcohol abuse, tobacco abuse and hypothyroidism   End-stage renal disease with hyperkalemia on hemodialysis.  Patient has missed multiple treatments over the past week.    Next treatment scheduled for Monday.  2. Diabetes mellitus type II with chronic kidney disease with diabetic ketoacidosis: Insulin-dependent with Lantus.  Blood sugar 436 on admission.   Glucose stable this morning  3.  Acute metabolic acidosis.  Serum bicarb 10 on admission.  Will continue to stabilize with dialysis.  4. Anemia of chronic kidney disease Lab Results  Component Value Date   HGB 9.7 (L) 10/30/2022    Patient receives Mircera at outpatient clinic.  Will consider low dose EPO with next treatment.  Will monitor for now.  5.  Hypertension with chronic kidney disease.  Home regimen includes carvedilol and furosemide.  Now receiving carvedilol only.  Blood pressure acceptable for this patient.  Continue current regimen.   LOS: 4 Mauricia Mertens 10/27/202412:34 PM

## 2022-11-01 DIAGNOSIS — E1110 Type 2 diabetes mellitus with ketoacidosis without coma: Secondary | ICD-10-CM | POA: Diagnosis not present

## 2022-11-01 LAB — GLUCOSE, CAPILLARY
Glucose-Capillary: 159 mg/dL — ABNORMAL HIGH (ref 70–99)
Glucose-Capillary: 156 mg/dL — ABNORMAL HIGH (ref 70–99)
Glucose-Capillary: 169 mg/dL — ABNORMAL HIGH (ref 70–99)

## 2022-11-01 LAB — CBC
HCT: 29.7 % — ABNORMAL LOW (ref 39.0–52.0)
Hemoglobin: 9.5 g/dL — ABNORMAL LOW (ref 13.0–17.0)
MCH: 29.9 pg (ref 26.0–34.0)
MCHC: 32 g/dL (ref 30.0–36.0)
MCV: 93.4 fL (ref 80.0–100.0)
Platelets: 201 10*3/uL (ref 150–400)
RBC: 3.18 MIL/uL — ABNORMAL LOW (ref 4.22–5.81)
RDW: 15.6 % — ABNORMAL HIGH (ref 11.5–15.5)
WBC: 3.4 10*3/uL — ABNORMAL LOW (ref 4.0–10.5)
nRBC: 0 % (ref 0.0–0.2)

## 2022-11-01 LAB — RENAL FUNCTION PANEL
Albumin: 2.8 g/dL — ABNORMAL LOW (ref 3.5–5.0)
Anion gap: 12 (ref 5–15)
BUN: 65 mg/dL — ABNORMAL HIGH (ref 8–23)
CO2: 21 mmol/L — ABNORMAL LOW (ref 22–32)
Calcium: 7.9 mg/dL — ABNORMAL LOW (ref 8.9–10.3)
Chloride: 98 mmol/L (ref 98–111)
Creatinine, Ser: 5.35 mg/dL — ABNORMAL HIGH (ref 0.61–1.24)
GFR, Estimated: 11 mL/min — ABNORMAL LOW (ref >60)
Glucose, Bld: 181 mg/dL — ABNORMAL HIGH (ref 70–99)
Phosphorus: 4.1 mg/dL (ref 2.5–4.6)
Potassium: 4.1 mmol/L (ref 3.5–5.1)
Sodium: 131 mmol/L — ABNORMAL LOW (ref 135–145)

## 2022-11-01 MED ORDER — INSULIN ASPART 100 UNIT/ML FLEXPEN: 3.0000 [IU] | PEN_INJECTOR | Freq: Three times a day (TID) | SUBCUTANEOUS | 11 refills | Status: DC

## 2022-11-01 MED ORDER — ALTEPLASE 2 MG IJ SOLR: 2.0000 mg | Freq: Once | INTRAMUSCULAR | Status: DC | PRN

## 2022-11-01 MED ORDER — HEPARIN SODIUM (PORCINE) 1000 UNIT/ML DIALYSIS: 1000.0000 [IU] | INTRAMUSCULAR | Status: DC | PRN

## 2022-11-01 MED ORDER — CARVEDILOL 6.25 MG PO TABS: 6.2500 mg | ORAL_TABLET | Freq: Two times a day (BID) | ORAL | 11 refills | Status: DC

## 2022-11-01 NOTE — Progress Notes (Signed)
Patient discharged with ambulance service at this time home.  Sister aware of discharge.  All belongings packed and taken with patient at this time.  AVS provided educated on discharge instructions, medication administration, and follow up appointments.  Talked about importance of not missing HD appointments.

## 2022-11-01 NOTE — Progress Notes (Signed)
[facility]  ROUNDING NOTE   Subjective:   Shawn [name] is a 67 y.o. male with past medical conditions including end stage renal disease on hemodialysis, hypertension, diabetes mellitus type II, congestive heart failure, coronary artery disease, CVA, alcohol abuse, tobacco abuse and hypothyroidism. He presents to the ED with nausea, vomiting and abd pain. Patient has been admitted for DKA (diabetic ketoacidosis) ([name]) [E11.10] Type 1 diabetes mellitus with ketoacidosis without coma ([name]) [E10.10]  Patient is known to our practice and receives outpatient dialysis treatments at [facility] on a MWF schedule, supervised by Dr Thedore Mins.  Patient seen sitting up in bed Breakfast tray at bedside, assisted in setting up for meal  Patient seen and evaluated later during dialysis   HEMODIALYSIS FLOWSHEET:  Blood Flow Rate (mL/min): 400 mL/min Arterial Pressure (mmHg): -180 mmHg Venous Pressure (mmHg): 150 mmHg TMP (mmHg): 1 mmHg Ultrafiltration Rate (mL/min): 543 mL/min Dialysate Flow Rate (mL/min): 300 ml/min    Objective:  Vital signs in last 24 hours:  Temp:  [97.8 F (36.6 C)-98.7 F (37.1 C)] 98.5 F (36.9 C) (10/28 0939) Pulse Rate:  [63-79] 63 (10/28 0954) Resp:  [0-25] 17 (10/28 0954) BP: (122-134)/(75-91) 122/81 (10/28 0954) SpO2:  [95 %-100 %] 97 % (10/28 0954)  Weight change:  Filed Weights   10/29/22 0500 10/29/22 0748 10/29/22 1155  Weight: 66.6 kg 63 kg 61.3 kg    Intake/Output: I/O last 3 completed shifts: In: 200 [P.O.:200] Out: 2 [Stool:2]   Intake/Output this shift:  Total I/O In: 120 [P.O.:120] Out: -   Physical Exam: General: NAD  Head: Normocephalic, atraumatic. Moist oral mucosal membranes  Eyes: Anicteric  Lungs:  Clear to auscultation, room air  Heart: Regular rate and rhythm  Abdomen:  Soft, nontender  Extremities: No peripheral edema.  Neurologic: Alert moving all four extremities  Skin: No lesions  Access:  Right PermCath    Basic Metabolic Panel: Recent Labs  Lab 10/27/22 2218 10/28/22 0305 10/28/22 0558 10/29/22 0811 10/30/22 1012  NA 136 135 134* 135 132*  K 4.7 4.7 4.7 4.6 4.2  CL 100 103 101 103 97*  CO2 16* 20* 21* 21* 19*  GLUCOSE 167* 75 102* 56* 378*  BUN 100* 91* 81* 80* 53*  CREATININE 7.72* 6.13* 5.40* 5.76* 4.37*  CALCIUM 8.2* 7.6* 7.7* 8.1* 7.8*  MG  --   --  2.2  --   --   PHOS  --  5.8*  --  5.8*  --     Liver Function Tests: Recent Labs  Lab 10/27/22 1155 10/28/22 0305 10/29/22 0811  AST 21  --   --   ALT 13  --   --   ALKPHOS 92  --   --   BILITOT 1.3*  --   --   PROT 6.5  --   --   ALBUMIN 2.9* 2.7* 2.6*   Recent Labs  Lab 10/27/22 1155  LIPASE 20   No results for input(s): "AMMONIA" in the last 168 hours.  CBC: Recent Labs  Lab 10/27/22 1155 10/27/22 1525 10/28/22 0558 10/29/22 0502 10/30/22 0639  WBC 7.0 7.4 5.5 6.2 5.0  NEUTROABS  --  6.3  --   --   --   HGB 9.8* 9.6* 9.3* 9.4* 9.7*  HCT 31.0* 30.8* 28.1* 29.2* 30.6*  MCV 95.4 97.5 91.5 93.0 93.9  PLT 217 222 194 193 191    Cardiac Enzymes: No results for input(s): "CKTOTAL", "CKMB", "CKMBINDEX", "TROPONINI" in the last  168 hours.  BNP: Invalid input(s): "POCBNP"  CBG: Recent Labs  Lab 10/31/22 0809 10/31/22 1153 10/31/22 1627 10/31/22 2147 11/01/22 0843  GLUCAP 168* 147* 262* 195* 159*    Microbiology: Results for orders placed or performed during the hospital encounter of 10/27/22  SARS Coronavirus 2 by RT PCR (hospital order, performed in [facility] hospital lab) *cepheid single result test* Anterior Nasal Swab     Status: None   Collection Time: 10/27/22  6:37 PM   Specimen: Anterior Nasal Swab  Result Value Ref Range Status   SARS Coronavirus 2 by RT PCR NEGATIVE NEGATIVE Final    Comment: (NOTE) SARS-CoV-2 target nucleic acids are NOT DETECTED.  The SARS-CoV-2 RNA is generally detectable in upper and lower respiratory specimens during the acute phase of  infection. The lowest concentration of SARS-CoV-2 viral copies this assay can detect is 250 copies / mL. A negative result does not preclude SARS-CoV-2 infection and should not be used as the sole basis for treatment or other patient management decisions.  A negative result may occur with improper specimen collection / handling, submission of specimen other than nasopharyngeal swab, presence of viral mutation(s) within the areas targeted by this assay, and inadequate number of viral copies (<250 copies / mL). A negative result must be combined with clinical observations, patient history, and epidemiological information.  Fact Sheet for Patients:   RoadLapTop.co.za  Fact Sheet for Healthcare Providers: http://kim-miller.com/  This test is not yet approved or  cleared by the [place] FDA and has been authorized for detection and/or diagnosis of SARS-CoV-2 by FDA under an Emergency Use Authorization (EUA).  This EUA will remain in effect (meaning this test can be used) for the duration of the COVID-19 declaration under Section 564(b)(1) of the Act, 21 U.S.C. section 360bbb-3(b)(1), unless the authorization is terminated or revoked sooner.  Performed at [facility], [address] [place], [place] [address]   MRSA Next Gen by PCR, Nasal     Status: None   Collection Time: 10/27/22  9:04 PM   Specimen: Nasal Mucosa; Nasal Swab  Result Value Ref Range Status   MRSA by PCR Next Gen NOT DETECTED NOT DETECTED Final    Comment: (NOTE) The GeneXpert MRSA Assay (FDA approved for NASAL specimens only), is one component of a comprehensive MRSA colonization surveillance program. It is not intended to diagnose MRSA infection nor to guide or monitor treatment for MRSA infections. Test performance is not FDA approved in patients less than 68 years old. Performed at [facility], [address] [place], [place] [address]    Culture, blood (Routine X 2) w Reflex to ID Panel     Status: None (Preliminary result)   Collection Time: 10/28/22  9:38 AM   Specimen: BLOOD RIGHT HAND  Result Value Ref Range Status   Specimen Description BLOOD RIGHT HAND  Final   Special Requests   Final    BOTTLES DRAWN AEROBIC ONLY Blood Culture adequate volume   Culture   Final    NO GROWTH 4 DAYS Performed at [facility], [address] [place], [place] [address]    Report Status PENDING  Incomplete  Culture, blood (Routine X 2) w Reflex to ID Panel     Status: None (Preliminary result)   Collection Time: 10/28/22 10:50 AM   Specimen: BLOOD  Result Value Ref Range Status   Specimen Description BLOOD RIGHT HAND  Final   Special Requests   Final  BOTTLES DRAWN AEROBIC AND ANAEROBIC Blood Culture results may not be optimal due to an inadequate volume of blood received in culture bottles   Culture   Final    NO GROWTH 4 DAYS Performed at [facility], [address] [place], [place] [address]    Report Status PENDING  Incomplete    Coagulation Studies: No results for input(s): "LABPROT", "INR" in the last 72 hours.  Urinalysis: No results for input(s): "COLORURINE", "LABSPEC", "PHURINE", "GLUCOSEU", "HGBUR", "BILIRUBINUR", "KETONESUR", "PROTEINUR", "UROBILINOGEN", "NITRITE", "LEUKOCYTESUR" in the last 72 hours.  Invalid input(s): "APPERANCEUR"    Imaging: No results found.   Medications:      aspirin  81 mg Oral Daily   atorvastatin  40 mg Oral QHS   carvedilol  3.125 mg Oral BID WC   Chlorhexidine Gluconate Cloth  6 each Topical Daily   feeding supplement  237 mL Oral TID BM   insulin aspart  0-6 Units Subcutaneous TID WC   insulin glargine-yfgn  5 Units Subcutaneous Daily   levothyroxine  75 mcg Oral QAC breakfast   multivitamin  1 tablet Oral QHS   nicotine  14 mg Transdermal Daily   pantoprazole (PROTONIX) IV  40 mg Intravenous Q12H   sodium chloride flush  10 mL Intravenous  Q12H   alteplase, dextrose, heparin, loperamide  Assessment/ Plan:  Mr. MARVION MICHALAK is a 67 y.o.  male with end stage renal disease on hemodialysis, hypertension, diabetes mellitus type II, congestive heart failure, coronary artery disease, CVA, alcohol abuse, tobacco abuse and hypothyroidism   End-stage renal disease with hyperkalemia on hemodialysis.  Patient has missed multiple treatments over the past week.   Receiving dialysis with UF goal 1L. Next treatment scheduled for Wednesday  2. Diabetes mellitus type II with chronic kidney disease with diabetic ketoacidosis: Insulin-dependent with Lantus.  Blood sugar 436 on admission.   Glucose remains elevated at times.   3.  Acute metabolic acidosis.  Serum bicarb 10 on admission.  Pending labs  4. Anemia of chronic kidney disease Lab Results  Component Value Date   HGB 9.7 (L) 10/30/2022    Patient receives Mircera at outpatient clinic. Will monitor for now.  5.  Hypertension with chronic kidney disease.  Home regimen includes carvedilol and furosemide.  Now receiving carvedilol only.  Blood pressure 128/83 during dialysis   LOS: 5 Joaopedro Eschbach 10/28/202410:11 AM

## 2022-11-01 NOTE — Progress Notes (Signed)
Patient returned from HD at this time.  No s/s of distress noted.

## 2022-11-01 NOTE — Care Management Important Message (Signed)
Important Message  Patient Details  Name: Shawn [name] MRN: [id number] Date of Birth: [birth year]   Important Message Given:  Yes - Medicare IM     Shawn [name] Shawn [name] 11/01/2022, 2:41 PM

## 2022-11-01 NOTE — TOC Transition Note (Signed)
Transition of Care [facility]) - CM/SW Discharge Note   Patient Details  Name: Shawn [name] MRN: [id number] Date of Birth: 20-Jul-1955  [facility] CM/SW Contact:  Darolyn Rua, LCSW Phone Number: 11/01/2022, 9:38 AM   Clinical Narrative:     Patient to discharge home today, per sister, family provides care at home in addition to home aide. Patient is wheel chair bound, sister [name] declines HH PT services, reports she is getting in touch with the home aide from [facility] and that she has applied for CAP program patient is on wait list.   Sister expressed concern with patient not eating or drinking on some days and less motivated to go to HD sessions, agreeable for [facility] outpatient palliative to follow up, referral given.  No additional discharge needs at this time.   Final next level of care: Home/Self Care Barriers to Discharge: No Barriers Identified   Patient Goals and CMS Choice CMS Medicare.gov Compare Post Acute Care list provided to:: Patient Choice offered to / list presented to : Patient  Discharge Placement                         Discharge Plan and Services Additional resources added to the After Visit Summary for                                       Social Determinants of Health (SDOH) Interventions SDOH Screenings   Food Insecurity: No Food Insecurity (10/27/2022)  Housing: Low Risk  (10/27/2022)  Transportation Needs: No Transportation Needs (10/27/2022)  Utilities: Not At Risk (10/27/2022)  Financial Resource Strain: High Risk (08/24/2019)   [facility], [facility]  Social Connections: Moderately Isolated (08/24/2019)   Received from [facility], [facility]  Stress: No Stress Concern Present (08/24/2019)   Received from [facility], [facility]  Tobacco Use: High Risk (10/27/2022)      Readmission Risk Interventions    09/04/2022   11:28 AM 06/24/2021    2:57 PM 01/08/2021    1:22 PM  Readmission Risk Prevention Plan  Transportation Screening Complete Complete Complete  PCP or Specialist Appt within 3-5 Days Complete    HRI or Home Care Consult Complete    Social Work Consult for Recovery Care Planning/Counseling Complete  Complete  Palliative Care Screening Not Applicable  Not Applicable  Medication Review Oceanographer) Complete Complete Complete  Palliative Care Screening  Not Applicable   Skilled Nursing Facility  Not Applicable

## 2022-11-01 NOTE — Progress Notes (Signed)
Received patient in bed to unit.  Alert and oriented.  Informed consent signed and in chart.   TX duration: 3.5 hours  Patient tolerated well.  Transported back to the room  Alert, without acute distress.  Hand-off given to patient's nurse.   Access used: Dialysis catheter Access issues: None  Total UF removed: 1L Medication(s) given: None Post HD VS: 137/86 Post HD weight: 61kg   Keyra Virella [facility]

## 2022-11-01 NOTE — Plan of Care (Signed)
Problem: Fluid Volume: Goal: Hemodynamic stability will improve Outcome: Adequate for Discharge   Problem: Clinical Measurements: Goal: Diagnostic test results will improve Outcome: Adequate for Discharge Goal: Signs and symptoms of infection will decrease Outcome: Adequate for Discharge   Problem: Respiratory: Goal: Ability to maintain adequate ventilation will improve Outcome: Adequate for Discharge   Problem: Education: Goal: Ability to describe self-care measures that may prevent or decrease complications (Diabetes Survival Skills Education) will improve Outcome: Adequate for Discharge Goal: Individualized Educational Video(s) Outcome: Adequate for Discharge   Problem: Coping: Goal: Ability to adjust to condition or change in health will improve Outcome: Adequate for Discharge   Problem: Fluid Volume: Goal: Ability to maintain a balanced intake and output will improve Outcome: Adequate for Discharge   Problem: Health Behavior/Discharge Planning: Goal: Ability to identify and utilize available resources and services will improve Outcome: Adequate for Discharge Goal: Ability to manage health-related needs will improve Outcome: Adequate for Discharge   Problem: Metabolic: Goal: Ability to maintain appropriate glucose levels will improve Outcome: Adequate for Discharge   Problem: Nutritional: Goal: Maintenance of adequate nutrition will improve Outcome: Adequate for Discharge Goal: Progress toward achieving an optimal weight will improve Outcome: Adequate for Discharge   Problem: Skin Integrity: Goal: Risk for impaired skin integrity will decrease Outcome: Adequate for Discharge   Problem: Tissue Perfusion: Goal: Adequacy of tissue perfusion will improve Outcome: Adequate for Discharge   Problem: Education: Goal: Ability to describe self-care measures that may prevent or decrease complications (Diabetes Survival Skills Education) will improve Outcome: Adequate  for Discharge Goal: Individualized Educational Video(s) Outcome: Adequate for Discharge   Problem: Cardiac: Goal: Ability to maintain an adequate cardiac output will improve Outcome: Adequate for Discharge   Problem: Health Behavior/Discharge Planning: Goal: Ability to identify and utilize available resources and services will improve Outcome: Adequate for Discharge Goal: Ability to manage health-related needs will improve Outcome: Adequate for Discharge   Problem: Fluid Volume: Goal: Ability to achieve a balanced intake and output will improve Outcome: Adequate for Discharge   Problem: Metabolic: Goal: Ability to maintain appropriate glucose levels will improve Outcome: Adequate for Discharge   Problem: Nutritional: Goal: Maintenance of adequate nutrition will improve Outcome: Adequate for Discharge Goal: Maintenance of adequate weight for body size and type will improve Outcome: Adequate for Discharge   Problem: Respiratory: Goal: Will regain and/or maintain adequate ventilation Outcome: Adequate for Discharge   Problem: Urinary Elimination: Goal: Ability to achieve and maintain adequate renal perfusion and functioning will improve Outcome: Adequate for Discharge   Problem: Education: Goal: Knowledge of General Education information will improve Description: Including pain rating scale, medication(s)/side effects and non-pharmacologic comfort measures Outcome: Adequate for Discharge   Problem: Health Behavior/Discharge Planning: Goal: Ability to manage health-related needs will improve Outcome: Adequate for Discharge   Problem: Clinical Measurements: Goal: Ability to maintain clinical measurements within normal limits will improve Outcome: Adequate for Discharge Goal: Will remain free from infection Outcome: Adequate for Discharge Goal: Diagnostic test results will improve Outcome: Adequate for Discharge Goal: Respiratory complications will improve Outcome:  Adequate for Discharge Goal: Cardiovascular complication will be avoided Outcome: Adequate for Discharge   Problem: Activity: Goal: Risk for activity intolerance will decrease Outcome: Adequate for Discharge   Problem: Nutrition: Goal: Adequate nutrition will be maintained Outcome: Adequate for Discharge   Problem: Coping: Goal: Level of anxiety will decrease Outcome: Adequate for Discharge   Problem: Elimination: Goal: Will not experience complications related to bowel motility Outcome: Adequate for Discharge Goal: Will  not experience complications related to urinary retention Outcome: Adequate for Discharge   Problem: Pain Management: Goal: General experience of comfort will improve Outcome: Adequate for Discharge   Problem: Safety: Goal: Ability to remain free from injury will improve Outcome: Adequate for Discharge   Problem: Skin Integrity: Goal: Risk for impaired skin integrity will decrease Outcome: Adequate for Discharge

## 2022-11-01 NOTE — Discharge Summary (Signed)
Physician Discharge Summary   Patient: Shawn [name] MRN: [id number] DOB: [birth year]  Admit date:     10/27/2022  Discharge date: 11/01/22  Discharge Physician: Jerika Wales   [facility]P: [facility]   Recommendations at discharge:   Keep scheduled hemodialysis appointment Take insulin as recommended Maintain consistent carbohydrate diet  Discharge Diagnoses: Principal Problem:   Diabetic ketoacidosis without coma associated with type 2 diabetes mellitus ([name]) Active Problems:   Chronic HFrEF (heart failure with reduced ejection fraction) ([name])   ESRD on dialysis ([name])   T2DM (type 2 diabetes mellitus) ([name])   Essential hypertension   Smoker   Alcohol abuse  Resolved Problems:   * No resolved hospital problems. [name] Course:   Shawn [name] is a 67 y.o. male with medical history significant of HFrEF, ESRD on hemodialysis Monday Wednesday Friday, substance abuse, history of CVA presenting with DKA, missed hemodialysis.  Patient reports approximate 2 days of intractable nausea and vomiting.  Emesis predominant nonbloody nonbilious.?  Trace blood.  No recent sick contacts.  No recent changes of fluid.  Patient states he stopped taking his insulin roughly 1 week ago.  Per report, patient has been overall fatigued.  Has been relatively compliant with hemodialysis.  Missed dialysis today secondary to lethargy.  No abdominal pain.  Mild diarrhea.  No chest pain or shortness of breath.  No focal hemiparesis or confusion.  Still smoking 1/4 to 1/2 pack/day.  Denies any active alcohol or substance abuse. Presented to the ER afebrile, hemodynamically stable.  White count 7, hemoglobin 9.8, platelets 217, glucose 350s, VBG with a pH of 7.18, bicarb of 10, CO2 27.  Creatinine 7.37.  Chest x-ray pending Review of Systems: As mentioned in the history of present illness. All other systems reviewed and are negative.    Assessment and Plan:  Active Problems:   Chronic  HFrEF (heart failure with reduced ejection fraction) ([name])   ESRD on dialysis ([name])   T2DM (type 2 diabetes mellitus) ([name])   Essential hypertension   Smoker   Alcohol abuse   Diabetic ketoacidosis without coma associated with type 2 diabetes mellitus ([name])   # DKA Resolved Patient required insulin drip due to severe metabolic acidosis on admission as well as CCRT Presented with episodes of nausea, vomiting and abdominal pain most likely related to DKA Presented with hyperglycemia, elevated gap, elevated beta-h.  Placed on judicious fluids and insulin gtt. This appears to be 2/2 noncompliance as patient hadn't taken insulin in a week. Noted to have fluctuations in his blood sugars and has had episodes of hypo and hyperglycemia Blood sugars have stabilized  Continue Semglee 5 units daily and NovoLog 3 units with meals Maintain consistent carbohydrate diet       # Debility Seen by physical therapy They had recommended skilled nursing facility on discharge but patient would rather go home with family Patient is wheelchair-bound     # Hyperkalemia 2/2 missed dialysis.  Resolved       # Hematemesis? Ruled out Sister says she noted some streaks of blood in emesis.  No hematemesis here and hgb stable from priors (9s) so relatively low concern for ongoing bleeding, suspect something like MW tear. H&H is stable         # HFrEF EF last year 25-30.  Hasn't seen cardiology for several years.  Continue renal replacement therapy for volume status management Continue carvedilol and diuretics on nondialysis days     # History CVA  Continue statin Resume aspirin     # Hypothyroid Continue synthroid     # BPH Continue flomax       Severe malnutrition secondary to chronic illness (BMI 18) Intervention Ensure Enlive po TID, each supplement provides 350 kcal and 20 grams of protein.   Magic cup TID with meals, each supplement provides 290 kcal and 9 grams of protein    Rena-vit po daily    Liberalize diet    Pt at high refeed risk; recommend monitor potassium, magnesium and phosphorus labs daily until stable   Daily weights         Consultants: Nephrology, critical care Procedures performed: Renal replacement therapy Disposition: Home Diet recommendation:  Discharge Diet Orders (From admission, onward)     Start     Ordered   11/01/22 0000  Diet - low sodium heart healthy        11/01/22 1243   11/01/22 0000  Diet Carb Modified        11/01/22 1243           Carb modified diet DISCHARGE MEDICATION: Allergies as of 11/01/2022       Reactions   Empagliflozin    Other Reaction(s): Acidosis   Pravastatin    Other reaction(s): Muscle pain   Simvastatin    Other reaction(s): Muscle pain        Medication List     STOP taking these medications    feeding supplement (GLUCERNA SHAKE) Liqd   furosemide 20 MG tablet Commonly known as: LASIX   polyethylene glycol 17 g packet Commonly known as: MIRALAX / GLYCOLAX       TAKE these medications    acetaminophen 325 MG tablet Commonly known as: TYLENOL Take 2 tablets (650 mg total) by mouth every 6 (six) hours as needed for mild pain (or Fever >/= 101).   ammonium lactate 12 % lotion Commonly known as: LAC-HYDRIN Apply 1 Application topically as needed for dry skin.   aspirin EC 81 MG tablet Take 81 mg by mouth daily.   atorvastatin 80 MG tablet Commonly known as: LIPITOR Take 40 mg by mouth at bedtime.   atropine 1 % ophthalmic solution Place 1 drop into the right eye 2 (two) times daily.   carvedilol 6.25 MG tablet Commonly known as: Coreg Take 1 tablet (6.25 mg total) by mouth 2 (two) times daily. What changed:  medication strength how much to take when to take this   cholecalciferol 25 MCG (1000 UNIT) tablet Commonly known as: VITAMIN D3 Take 2,000 Units by mouth daily.   dextrose 40 % Gel Commonly known as: GLUTOSE Take 1 Tube by mouth once as  needed for low blood sugar.   erythromycin ophthalmic ointment Place 1 Application into the right eye at bedtime.   hydrOXYzine 10 MG tablet Commonly known as: ATARAX Take 10 mg by mouth daily as needed for itching or anxiety.   insulin aspart 100 UNIT/ML FlexPen Commonly known as: NOVOLOG Inject 3 Units into the skin 3 (three) times daily with meals.   insulin glargine-yfgn 100 UNIT/ML injection Commonly known as: SEMGLEE Inject 0.05 mLs (5 Units total) into the skin daily at 12 noon.   multivitamin with minerals Tabs tablet Take 1 tablet by mouth daily.   omeprazole 20 MG capsule Commonly known as: PRILOSEC Take 20 mg by mouth daily.   oxyCODONE 5 MG immediate release tablet Commonly known as: Oxy IR/ROXICODONE Take 5 mg by mouth every 6 (six) hours as needed for  moderate pain (pain score 4-6) or severe pain (pain score 7-10).   prednisoLONE acetate 1 % ophthalmic suspension Commonly known as: PRED FORTE Place 1 drop into the right eye as directed.   Synthroid 75 MCG tablet Generic drug: levothyroxine Take 75 mcg by mouth daily before breakfast.   tamsulosin 0.4 MG Caps capsule Commonly known as: FLOMAX Take 1 capsule (0.4 mg total) by mouth daily after supper.   torsemide 20 MG tablet Commonly known as: DEMADEX Take 60 mg by mouth as directed. Take 3 tablets (60mg ) by mouth every Sunday, Tuesday, Thursday and Saturday        Discharge Exam: Filed Weights   10/29/22 0500 10/29/22 0748 10/29/22 1155  Weight: 66.6 kg 63 kg 61.3 kg   General exam: Appears calm and comfortable, chronically ill appearing Respiratory system: Clear to auscultation, normal sob Cardiovascular system: S1 & S2 heard, RRR. Distant heart sounds, soft systolic murmur Gastrointestinal system: Abdomen is nondistended, soft and nontender.   Central nervous system: Alert and oriented. No focal neurological deficits. Extremities: warm, trace edema Skin: No visible rashes, lesions or  ulcers Psychiatry: Alert, oriented to person place and time.    Condition at discharge: stable  The results of significant diagnostics from this hospitalization (including imaging, microbiology, ancillary and laboratory) are listed below for reference.   Imaging Studies: DG Chest Portable 1 View  Result Date: 10/27/2022 CLINICAL DATA:  Nausea/vomiting/abdominal pain EXAM: PORTABLE CHEST - 1 VIEW COMPARISON:  09/03/2022 FINDINGS: Stable right IJ hemodialysis catheter to the proximal right atrium. Resolution of the interstitial edema seen previously. Left retrocardiac consolidation/atelectasis. Stable borderline cardiomegaly. No effusion. Visualized bones unremarkable. IMPRESSION: Left retrocardiac consolidation/atelectasis. Electronically Signed   By: Corlis Leak M.D.   On: 10/27/2022 16:51    Microbiology: Results for orders placed or performed during the hospital encounter of 10/27/22  SARS Coronavirus 2 by RT PCR (hospital order, performed in [facility] hospital lab) *cepheid single result test* Anterior Nasal Swab     Status: None   Collection Time: 10/27/22  6:37 PM   Specimen: Anterior Nasal Swab  Result Value Ref Range Status   SARS Coronavirus 2 by RT PCR NEGATIVE NEGATIVE Final    Comment: (NOTE) SARS-CoV-2 target nucleic acids are NOT DETECTED.  The SARS-CoV-2 RNA is generally detectable in upper and lower respiratory specimens during the acute phase of infection. The lowest concentration of SARS-CoV-2 viral copies this assay can detect is 250 copies / mL. A negative result does not preclude SARS-CoV-2 infection and should not be used as the sole basis for treatment or other patient management decisions.  A negative result may occur with improper specimen collection / handling, submission of specimen other than nasopharyngeal swab, presence of viral mutation(s) within the areas targeted by this assay, and inadequate number of viral copies (<250 copies / mL). A negative  result must be combined with clinical observations, patient history, and epidemiological information.  Fact Sheet for Patients:   RoadLapTop.co.za  Fact Sheet for Healthcare Providers: http://kim-miller.com/  This test is not yet approved or  cleared by the [place] FDA and has been authorized for detection and/or diagnosis of SARS-CoV-2 by FDA under an Emergency Use Authorization (EUA).  This EUA will remain in effect (meaning this test can be used) for the duration of the COVID-19 declaration under Section 564(b)(1) of the Act, 21 U.S.C. section 360bbb-3(b)(1), unless the authorization is terminated or revoked sooner.  Performed at [facility], [address], [place], [place] [address]  MRSA Next Gen by PCR, Nasal     Status: None   Collection Time: 10/27/22  9:04 PM   Specimen: Nasal Mucosa; Nasal Swab  Result Value Ref Range Status   MRSA by PCR Next Gen NOT DETECTED NOT DETECTED Final    Comment: (NOTE) The GeneXpert MRSA Assay (FDA approved for NASAL specimens only), is one component of a comprehensive MRSA colonization surveillance program. It is not intended to diagnose MRSA infection nor to guide or monitor treatment for MRSA infections. Test performance is not FDA approved in patients less than 72 years old. Performed at [facility], [address] [place], [place] [address]   Culture, blood (Routine X 2) w Reflex to ID Panel     Status: None (Preliminary result)   Collection Time: 10/28/22  9:38 AM   Specimen: BLOOD RIGHT HAND  Result Value Ref Range Status   Specimen Description BLOOD RIGHT HAND  Final   Special Requests   Final    BOTTLES DRAWN AEROBIC ONLY Blood Culture adequate volume   Culture   Final    NO GROWTH 4 DAYS Performed at [facility], [address] [place], [place] [address]    Report Status PENDING  Incomplete  Culture, blood (Routine X 2) w Reflex to ID  Panel     Status: None (Preliminary result)   Collection Time: 10/28/22 10:50 AM   Specimen: BLOOD  Result Value Ref Range Status   Specimen Description BLOOD RIGHT HAND  Final   Special Requests   Final    BOTTLES DRAWN AEROBIC AND ANAEROBIC Blood Culture results may not be optimal due to an inadequate volume of blood received in culture bottles   Culture   Final    NO GROWTH 4 DAYS Performed at [facility], [address] [place], [place] [address]    Report Status PENDING  Incomplete    Labs: CBC: Recent Labs  Lab 10/27/22 1525 10/28/22 0558 10/29/22 0502 10/30/22 0639 11/01/22 0956  WBC 7.4 5.5 6.2 5.0 3.4*  NEUTROABS 6.3  --   --   --   --   HGB 9.6* 9.3* 9.4* 9.7* 9.5*  HCT 30.8* 28.1* 29.2* 30.6* 29.7*  MCV 97.5 91.5 93.0 93.9 93.4  PLT 222 194 193 191 201   Basic Metabolic Panel: Recent Labs  Lab 10/28/22 0305 10/28/22 0558 10/29/22 0811 10/30/22 1012 11/01/22 0956  NA 135 134* 135 132* 131*  K 4.7 4.7 4.6 4.2 4.1  CL 103 101 103 97* 98  CO2 20* 21* 21* 19* 21*  GLUCOSE 75 102* 56* 378* 181*  BUN 91* 81* 80* 53* 65*  CREATININE 6.13* 5.40* 5.76* 4.37* 5.35*  CALCIUM 7.6* 7.7* 8.1* 7.8* 7.9*  MG  --  2.2  --   --   --   PHOS 5.8*  --  5.8*  --  4.1   Liver Function Tests: Recent Labs  Lab 10/27/22 1155 10/28/22 0305 10/29/22 0811 11/01/22 0956  AST 21  --   --   --   ALT 13  --   --   --   ALKPHOS 92  --   --   --   BILITOT 1.3*  --   --   --   PROT 6.5  --   --   --   ALBUMIN 2.9* 2.7* 2.6* 2.8*   CBG: Recent Labs  Lab 10/31/22 0809 10/31/22 1153 10/31/22 1627 10/31/22 2147 11/01/22 0843  GLUCAP 168* 147* 262* 195*  159*    Discharge time spent: greater than 30 minutes.  Signed: Lucile Shutters, MD [facility] 11/01/2022

## 2022-11-01 NOTE — Plan of Care (Signed)
  Problem: Clinical Measurements: Goal: Diagnostic test results will improve Outcome: Progressing   Problem: Respiratory: Goal: Ability to maintain adequate ventilation will improve Outcome: Progressing   Problem: Fluid Volume: Goal: Ability to maintain a balanced intake and output will improve Outcome: Progressing   Problem: Metabolic: Goal: Ability to maintain appropriate glucose levels will improve Outcome: Progressing   Problem: Nutritional: Goal: Maintenance of adequate nutrition will improve Outcome: Progressing

## 2022-11-01 NOTE — Progress Notes (Signed)
Physical Therapy Treatment Patient Details Name: Shawn [name] MRN: [id number] DOB: 05-08-1955 Today's Date: 11/01/2022   History of Present Illness 67 y.o. male with medical history significant of HFrEF, ESRD on hemodialysis Monday Wednesday Friday, substance abuse, history of CVA presenting with DKA, missed hemodialysis.  Patient reports approximate 2 days of intractable nausea and vomiting - found to be in DKA.    PT Comments  With min to mod encouragement and education on physiological benefits of activity pt will to participate with PT services.  Pt required min A with bed mobility tasks and transfers from a slightly elevated EOB.  Once in standing pt was able to take several steps with min A for stability and a RW from bed to chair with SpO2 and HR WNL and with no adverse symptoms reported by pt.  Pt very close to his baseline where he is functionally non-ambulatory and is limited to transferring to/from wheel chair for home and community access.  Pt declined SNF placement and stated that with assist from his sister he would be able to safely discharge home.  Educated pt on benefits of non-emergent transport home, MD and care team notified of updated discharge recommendations.  Pt will benefit from continued PT services upon discharge to safely address deficits listed in patient problem list for decreased caregiver assistance and eventual return to PLOF.    If plan is discharge home, recommend the following: A little help with walking and/or transfers;A little help with bathing/dressing/bathroom;Assistance with cooking/housework;Direct supervision/assist for medications management;Assist for transportation   Can travel by private vehicle     No  Equipment Recommendations  None recommended by PT    Recommendations for Other Services       Precautions / Restrictions Precautions Precautions: Fall Restrictions Weight Bearing Restrictions: No     Mobility  Bed Mobility Overal  bed mobility: Needs Assistance Bed Mobility: Supine to Sit     Supine to sit: Min assist     General bed mobility comments: Min A for trunk control with cues for sequencing    Transfers Overall transfer level: Needs assistance Equipment used: Rolling walker (2 wheels) Transfers: Sit to/from Stand Sit to Stand: From elevated surface, Min assist           General transfer comment: Pt unable to stand independently from standard bed height, able to stand with min A from elevated bed with cues for general sequencing.    Ambulation/Gait Ambulation/Gait assistance: Min assist Gait Distance (Feet): 2 Feet Assistive device: Rolling walker (2 wheels) Gait Pattern/deviations: Step-to pattern, Decreased step length - right, Decreased step length - left, Shuffle, Trunk flexed Gait velocity: decreased     General Gait Details: Pt able to take several very small, shuffling steps at the EOB and from bed to chair with min A for stability   Stairs             Wheelchair Mobility     Tilt Bed    Modified Rankin (Stroke Patients Only)       Balance Overall balance assessment: Needs assistance Sitting-balance support: Bilateral upper extremity supported, Feet supported Sitting balance-Leahy Scale: Fair     Standing balance support: Bilateral upper extremity supported, During functional activity Standing balance-Leahy Scale: Poor Standing balance comment: Min A for stability in standing                            Cognition Arousal: Alert Behavior During Therapy: [facility] for  tasks assessed/performed Overall Cognitive Status: Within Functional Limits for tasks assessed                                          Exercises Other Exercises Other Exercises: Pt education provided on physiological benefits of activity    General Comments        Pertinent Vitals/Pain Pain Assessment Pain Assessment: No/denies pain    Home Living                           Prior Function            PT Goals (current goals can now be found in the care plan section) Progress towards PT goals: Progressing toward goals    Frequency    Min 1X/week      PT Plan      Co-evaluation              AM-PAC PT "6 Clicks" Mobility   Outcome Measure  Help needed turning from your back to your side while in a flat bed without using bedrails?: A Little Help needed moving from lying on your back to sitting on the side of a flat bed without using bedrails?: A Little Help needed moving to and from a bed to a chair (including a wheelchair)?: A Little Help needed standing up from a chair using your arms (e.g., wheelchair or bedside chair)?: A Little Help needed to walk in hospital room?: Total Help needed climbing 3-5 steps with a railing? : Total 6 Click Score: 14    End of Session Equipment Utilized During Treatment: Gait belt Activity Tolerance: Patient tolerated treatment well Patient left: in chair;with call bell/phone within reach;with nursing/sitter in room Nurse Communication: Mobility status;Other (comment) (No chair alarm on chair, nursing in room and aware) PT Visit Diagnosis: Muscle weakness (generalized) (M62.81);Difficulty in walking, not elsewhere classified (R26.2)     Time: 1610-9604 PT Time Calculation (min) (ACUTE ONLY): 18 min  Charges:    $Therapeutic Activity: 8-22 mins PT General Charges $$ ACUTE PT VISIT: 1 Visit                    D. Elly Modena PT, DPT 11/01/22, 3:38 PM

## 2022-11-02 LAB — CULTURE, BLOOD (ROUTINE X 2)
Culture: NO GROWTH
Culture: NO GROWTH
Special Requests: ADEQUATE

## 2022-11-02 NOTE — Progress Notes (Signed)
[facility] Liaison Note:   (new referral for outpatient palliative services) Notified by [facility], Darolyn Rua, LCSW,  of patient/family request for [facility] services at home after discharge.  Referral submitted today.   Please call with any hospice or outpatient palliative care related questions.  Thank you for the opportunity to participate in this patient's care.  Redge Gainer, [facility] Liaison [phone]

## 2022-11-05 IMAGING — US US PARACENTESIS
1 series · 15 of 16 positions shown · non-contrast
Comparison: none

INDICATION: Patient with history of CHF admitted with fluid overload and ascites
on CT. Request to IR for diagnostic and therapeutic paracentesis.

[Series 1: us paracentesis · 15 of 16 slices shown]
[im 1/16]
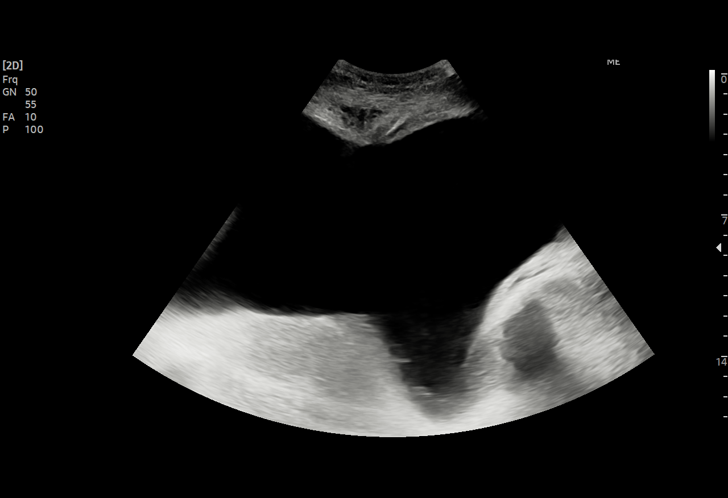
[im 2/16]
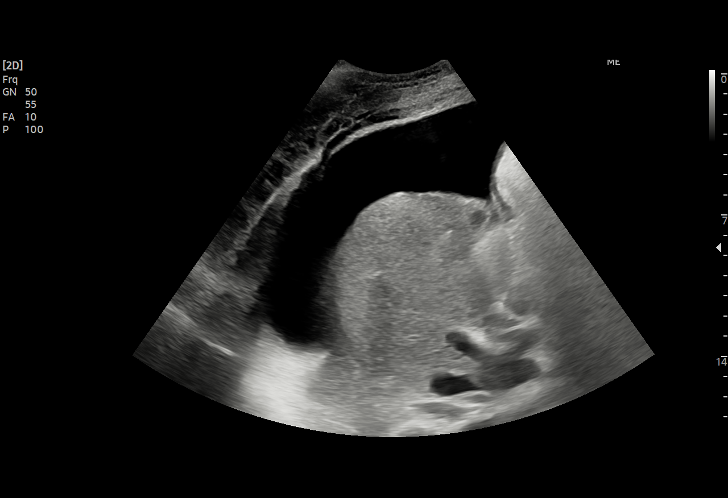
[im 3/16]
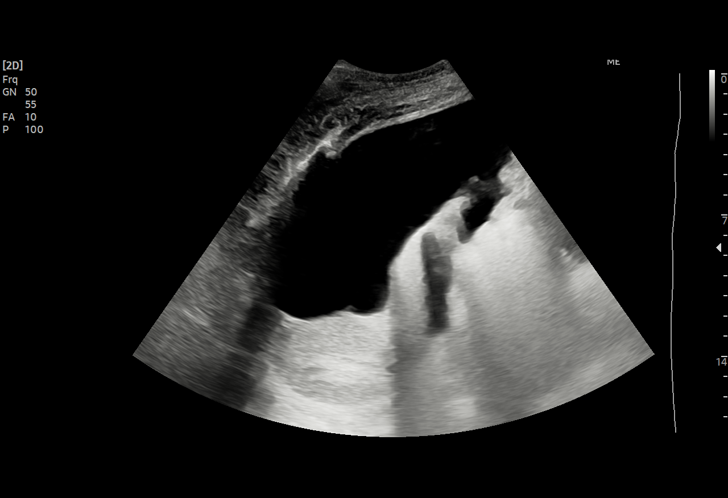
[im 4/16]
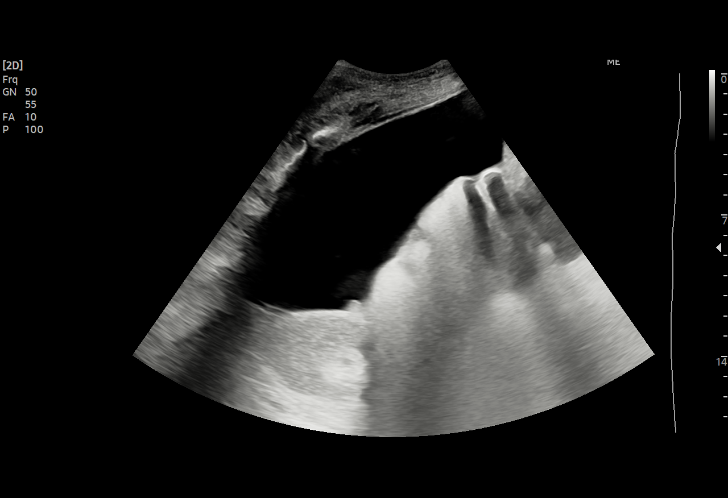
[im 5/16]
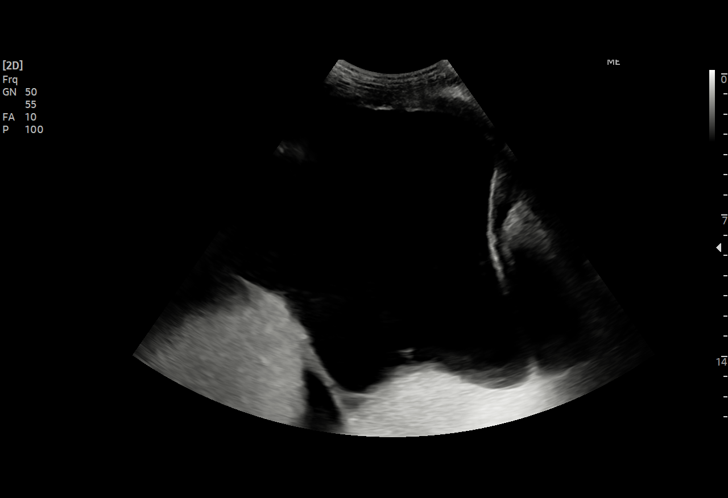
[im 6/16]
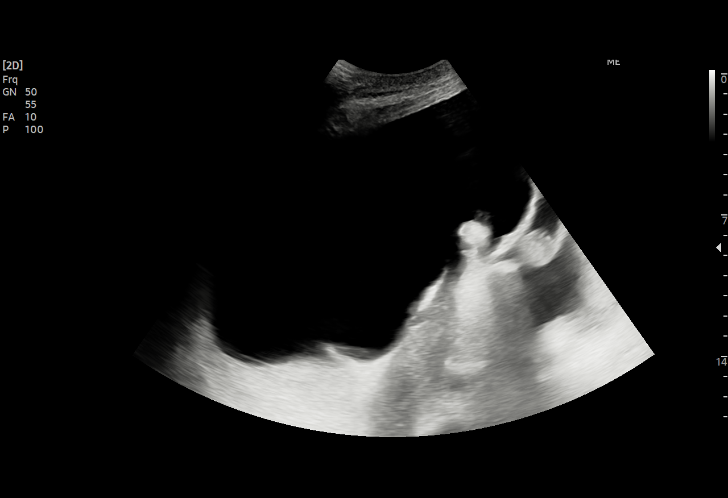
[im 7/16]
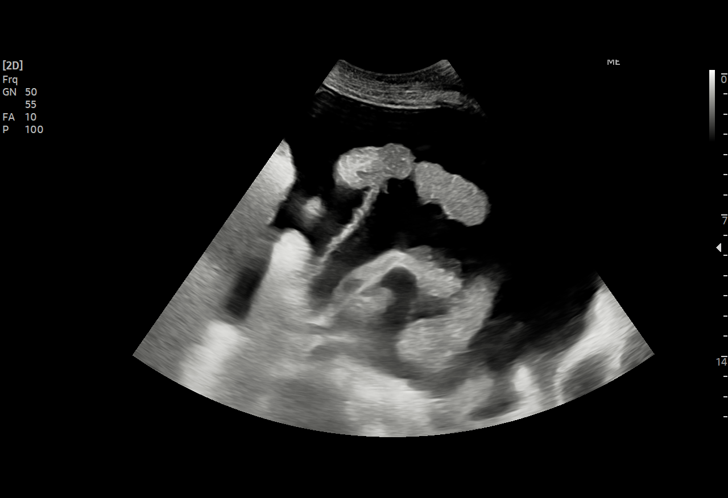
[im 9/16]
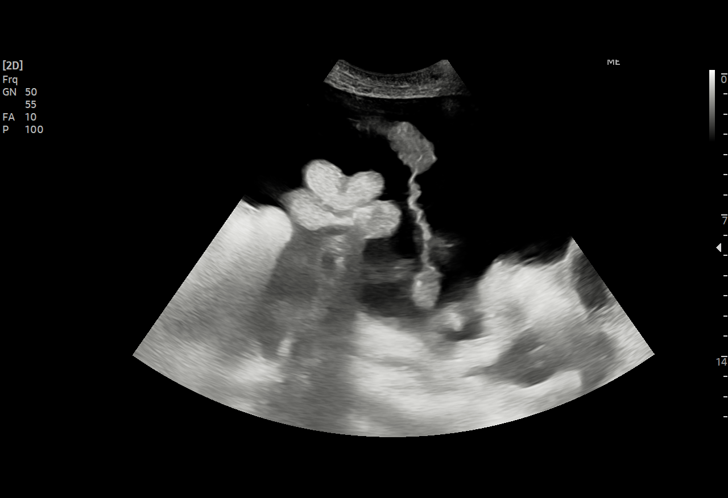
[im 10/16]
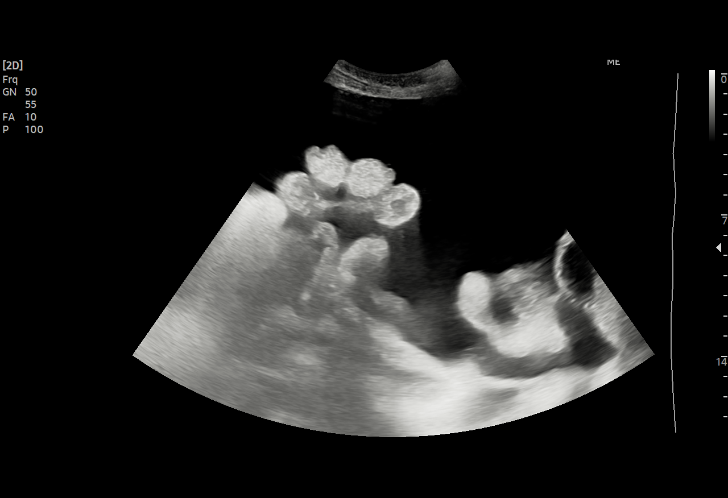
[im 11/16]
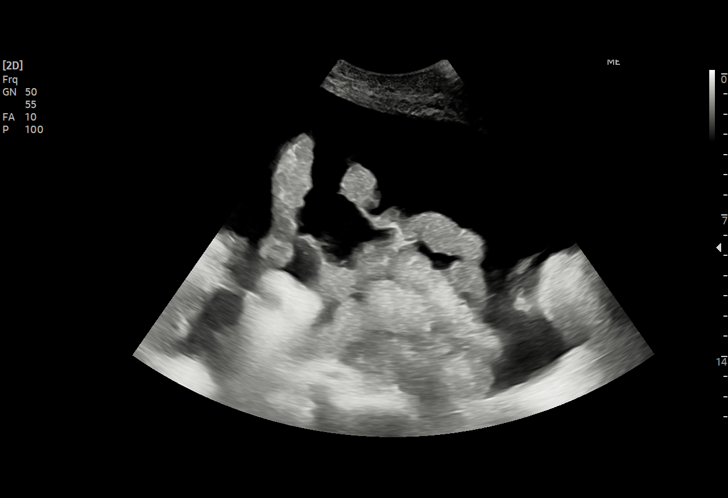
[im 12/16]
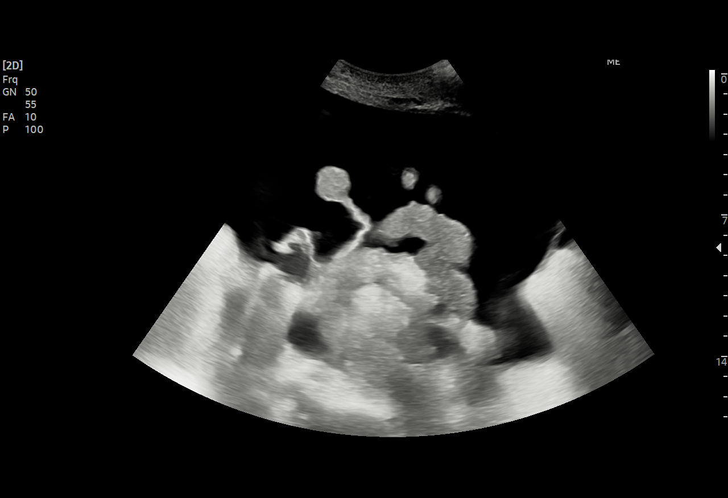
[im 13/16]
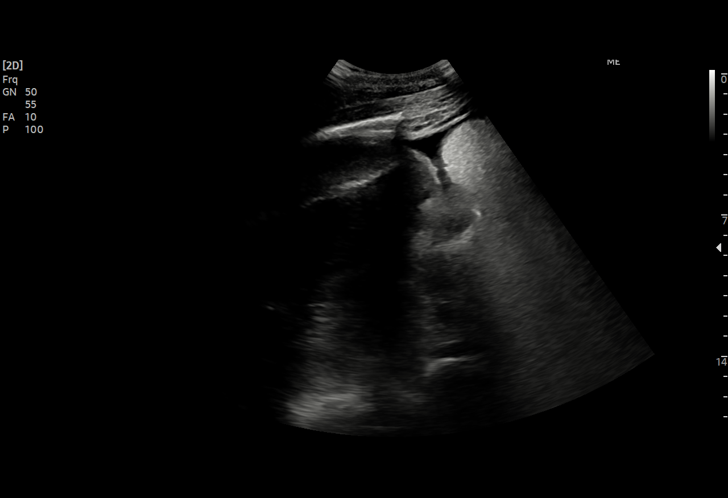
[im 14/16]
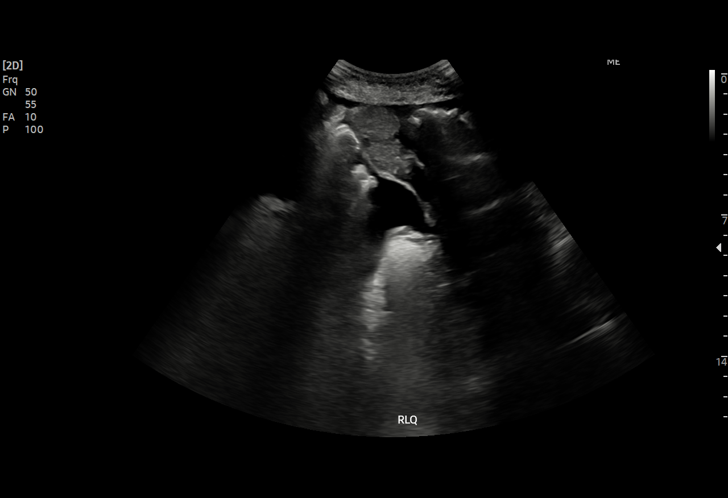
[im 15/16]
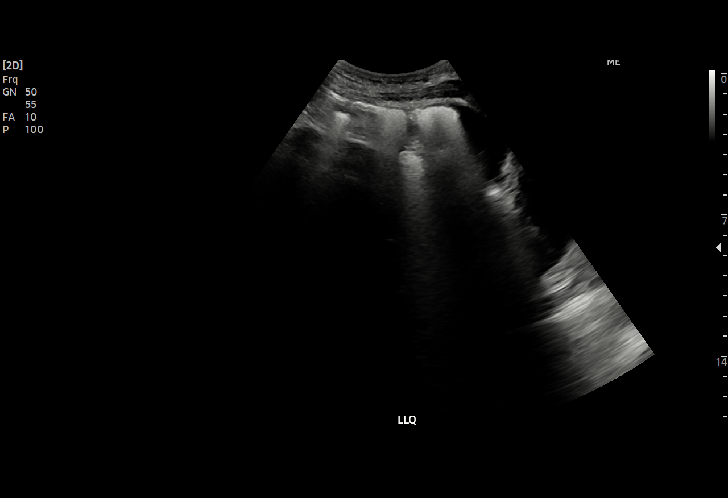
[im 16/16]
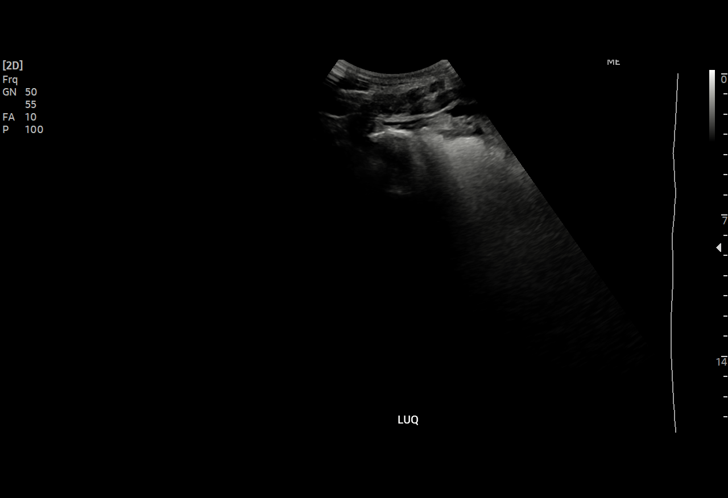

[15 of 16 positions shown; findings below may reference images not displayed]

EXAM:
ULTRASOUND GUIDED DIAGNOSTIC AND THERAPEUTIC PARACENTESIS

MEDICATIONS:
10 mL 1% lidocaine

COMPLICATIONS:
None immediate.

PROCEDURE:
Informed written consent was obtained from the patient after a
discussion of the risks, benefits and alternatives to treatment. A
timeout was performed prior to the initiation of the procedure.

Initial ultrasound scanning demonstrates a large amount of ascites
within the right lower abdominal quadrant. The right lower abdomen
was prepped and draped in the usual sterile fashion. 1% lidocaine
was used for local anesthesia.

Following this, a 19 gauge, 7-cm, Yueh catheter was introduced. An
ultrasound image was saved for documentation purposes. The
paracentesis was performed. The catheter was removed and a dressing
was applied. The patient tolerated the procedure well without
immediate post procedural complication.
FINDINGS: A total of approximately 5.3 L of clear yellow fluid was removed.
Samples were sent to the laboratory as requested by the clinical
team.
IMPRESSION: Successful ultrasound-guided paracentesis yielding 5.3 liters of
peritoneal fluid.

Read by Janice, Kaiyuan

## 2022-11-10 ENCOUNTER — Inpatient Hospital Stay: Payer: 59

## 2022-11-10 ENCOUNTER — Other Ambulatory Visit: Payer: Self-pay

## 2022-11-10 ENCOUNTER — Emergency Department: Payer: 59

## 2022-11-10 ENCOUNTER — Inpatient Hospital Stay
Admission: EM | Admit: 2022-11-10 | Discharge: 2022-11-16 | DRG: 640 | Disposition: A | Discharge status: Home Health | Payer: 59 | Attending: Hospitalist | Admitting: Hospitalist

## 2022-11-10 ENCOUNTER — Encounter: Payer: Self-pay | Admitting: Family Medicine

## 2022-11-10 DIAGNOSIS — N186 End stage renal disease: Secondary | ICD-10-CM | POA: Diagnosis present

## 2022-11-10 DIAGNOSIS — F1010 Alcohol abuse, uncomplicated: Secondary | ICD-10-CM | POA: Diagnosis present

## 2022-11-10 DIAGNOSIS — E162 Hypoglycemia, unspecified: Principal | ICD-10-CM | POA: Diagnosis present

## 2022-11-10 DIAGNOSIS — E039 Hypothyroidism, unspecified: Secondary | ICD-10-CM | POA: Diagnosis present

## 2022-11-10 DIAGNOSIS — Z7982 Long term (current) use of aspirin: Secondary | ICD-10-CM

## 2022-11-10 DIAGNOSIS — Z91199 Patient's noncompliance with other medical treatment and regimen due to unspecified reason: Secondary | ICD-10-CM

## 2022-11-10 DIAGNOSIS — E785 Hyperlipidemia, unspecified: Secondary | ICD-10-CM | POA: Diagnosis present

## 2022-11-10 DIAGNOSIS — Z992 Dependence on renal dialysis: Secondary | ICD-10-CM

## 2022-11-10 DIAGNOSIS — Z833 Family history of diabetes mellitus: Secondary | ICD-10-CM

## 2022-11-10 DIAGNOSIS — D631 Anemia in chronic kidney disease: Secondary | ICD-10-CM | POA: Diagnosis present

## 2022-11-10 DIAGNOSIS — A0472 Enterocolitis due to Clostridium difficile, not specified as recurrent: Secondary | ICD-10-CM | POA: Diagnosis present

## 2022-11-10 DIAGNOSIS — E8770 Fluid overload, unspecified: Secondary | ICD-10-CM | POA: Diagnosis present

## 2022-11-10 DIAGNOSIS — E875 Hyperkalemia: Secondary | ICD-10-CM | POA: Diagnosis present

## 2022-11-10 DIAGNOSIS — Z794 Long term (current) use of insulin: Secondary | ICD-10-CM | POA: Diagnosis not present

## 2022-11-10 DIAGNOSIS — R188 Other ascites: Secondary | ICD-10-CM | POA: Diagnosis present

## 2022-11-10 DIAGNOSIS — E1122 Type 2 diabetes mellitus with diabetic chronic kidney disease: Secondary | ICD-10-CM | POA: Diagnosis present

## 2022-11-10 DIAGNOSIS — E1165 Type 2 diabetes mellitus with hyperglycemia: Secondary | ICD-10-CM | POA: Diagnosis not present

## 2022-11-10 DIAGNOSIS — I428 Other cardiomyopathies: Secondary | ICD-10-CM | POA: Diagnosis present

## 2022-11-10 DIAGNOSIS — Z515 Encounter for palliative care: Secondary | ICD-10-CM | POA: Diagnosis not present

## 2022-11-10 DIAGNOSIS — E119 Type 2 diabetes mellitus without complications: Secondary | ICD-10-CM | POA: Diagnosis present

## 2022-11-10 DIAGNOSIS — Z681 Body mass index (BMI) 19 or less, adult: Secondary | ICD-10-CM

## 2022-11-10 DIAGNOSIS — Z8673 Personal history of transient ischemic attack (TIA), and cerebral infarction without residual deficits: Secondary | ICD-10-CM | POA: Diagnosis not present

## 2022-11-10 DIAGNOSIS — Z720 Tobacco use: Secondary | ICD-10-CM | POA: Diagnosis not present

## 2022-11-10 DIAGNOSIS — I132 Hypertensive heart and chronic kidney disease with heart failure and with stage 5 chronic kidney disease, or end stage renal disease: Secondary | ICD-10-CM | POA: Diagnosis present

## 2022-11-10 DIAGNOSIS — I5042 Chronic combined systolic (congestive) and diastolic (congestive) heart failure: Secondary | ICD-10-CM | POA: Diagnosis present

## 2022-11-10 DIAGNOSIS — Z79899 Other long term (current) drug therapy: Secondary | ICD-10-CM

## 2022-11-10 DIAGNOSIS — I10 Essential (primary) hypertension: Secondary | ICD-10-CM | POA: Diagnosis present

## 2022-11-10 DIAGNOSIS — E11649 Type 2 diabetes mellitus with hypoglycemia without coma: Secondary | ICD-10-CM | POA: Diagnosis present

## 2022-11-10 DIAGNOSIS — E871 Hypo-osmolality and hyponatremia: Secondary | ICD-10-CM | POA: Diagnosis present

## 2022-11-10 DIAGNOSIS — F17200 Nicotine dependence, unspecified, uncomplicated: Secondary | ICD-10-CM | POA: Diagnosis present

## 2022-11-10 DIAGNOSIS — D509 Iron deficiency anemia, unspecified: Secondary | ICD-10-CM | POA: Diagnosis not present

## 2022-11-10 DIAGNOSIS — E43 Unspecified severe protein-calorie malnutrition: Secondary | ICD-10-CM | POA: Diagnosis present

## 2022-11-10 DIAGNOSIS — I2510 Atherosclerotic heart disease of native coronary artery without angina pectoris: Secondary | ICD-10-CM | POA: Diagnosis present

## 2022-11-10 DIAGNOSIS — I5020 Unspecified systolic (congestive) heart failure: Secondary | ICD-10-CM | POA: Diagnosis present

## 2022-11-10 DIAGNOSIS — R197 Diarrhea, unspecified: Secondary | ICD-10-CM

## 2022-11-10 DIAGNOSIS — J90 Pleural effusion, not elsewhere classified: Secondary | ICD-10-CM

## 2022-11-10 DIAGNOSIS — R680 Hypothermia, not associated with low environmental temperature: Secondary | ICD-10-CM | POA: Diagnosis present

## 2022-11-10 DIAGNOSIS — Z8249 Family history of ischemic heart disease and other diseases of the circulatory system: Secondary | ICD-10-CM

## 2022-11-10 DIAGNOSIS — E8779 Other fluid overload: Principal | ICD-10-CM | POA: Diagnosis present

## 2022-11-10 DIAGNOSIS — A498 Other bacterial infections of unspecified site: Secondary | ICD-10-CM | POA: Diagnosis not present

## 2022-11-10 DIAGNOSIS — F17210 Nicotine dependence, cigarettes, uncomplicated: Secondary | ICD-10-CM | POA: Diagnosis present

## 2022-11-10 DIAGNOSIS — Z79890 Hormone replacement therapy (postmenopausal): Secondary | ICD-10-CM

## 2022-11-10 DIAGNOSIS — Z888 Allergy status to other drugs, medicaments and biological substances status: Secondary | ICD-10-CM

## 2022-11-10 DIAGNOSIS — R112 Nausea with vomiting, unspecified: Secondary | ICD-10-CM

## 2022-11-10 DIAGNOSIS — T68XXXA Hypothermia, initial encounter: Secondary | ICD-10-CM | POA: Diagnosis present

## 2022-11-10 LAB — BODY FLUID CELL COUNT WITH DIFFERENTIAL
Eos, Fluid: 0 %
Lymphs, Fluid: 48 %
Monocyte-Macrophage-Serous Fluid: 52 %
Neutrophil Count, Fluid: 0 %
Total Nucleated Cell Count, Fluid: 120 uL

## 2022-11-10 LAB — LIPASE, BLOOD: Lipase: 21 U/L (ref 11–51)

## 2022-11-10 LAB — COMPREHENSIVE METABOLIC PANEL
ALT: 19 U/L (ref 0–44)
AST: 29 U/L (ref 15–41)
Albumin: 2.7 g/dL — ABNORMAL LOW (ref 3.5–5.0)
Alkaline Phosphatase: 98 U/L (ref 38–126)
Anion gap: 11 (ref 5–15)
BUN: 108 mg/dL — ABNORMAL HIGH (ref 8–23)
CO2: 22 mmol/L (ref 22–32)
Calcium: 8.2 mg/dL — ABNORMAL LOW (ref 8.9–10.3)
Chloride: 97 mmol/L — ABNORMAL LOW (ref 98–111)
Creatinine, Ser: 8.01 mg/dL — ABNORMAL HIGH (ref 0.61–1.24)
GFR, Estimated: 7 mL/min — ABNORMAL LOW (ref >60)
Glucose, Bld: 136 mg/dL — ABNORMAL HIGH (ref 70–99)
Potassium: 5.6 mmol/L — ABNORMAL HIGH (ref 3.5–5.1)
Sodium: 130 mmol/L — ABNORMAL LOW (ref 135–145)
Total Bilirubin: 0.8 mg/dL (ref <1.2)
Total Protein: 6.5 g/dL (ref 6.5–8.1)

## 2022-11-10 LAB — CBG MONITORING, ED
Glucose-Capillary: 62 mg/dL — ABNORMAL LOW (ref 70–99)
Glucose-Capillary: 188 mg/dL — ABNORMAL HIGH (ref 70–99)
Glucose-Capillary: 68 mg/dL — ABNORMAL LOW (ref 70–99)
Glucose-Capillary: 73 mg/dL (ref 70–99)

## 2022-11-10 LAB — CBC WITH DIFFERENTIAL/PLATELET
Abs Immature Granulocytes: 0.01 10*3/uL (ref 0.00–0.07)
Basophils Absolute: 0.1 10*3/uL (ref 0.0–0.1)
Basophils Relative: 1 %
Eosinophils Absolute: 0.1 10*3/uL (ref 0.0–0.5)
Eosinophils Relative: 2 %
HCT: 29.6 % — ABNORMAL LOW (ref 39.0–52.0)
Hemoglobin: 9.6 g/dL — ABNORMAL LOW (ref 13.0–17.0)
Immature Granulocytes: 0 %
Lymphocytes Relative: 19 %
Lymphs Abs: 0.8 10*3/uL (ref 0.7–4.0)
MCH: 30.5 pg (ref 26.0–34.0)
MCHC: 32.4 g/dL (ref 30.0–36.0)
MCV: 94 fL (ref 80.0–100.0)
Monocytes Absolute: 0.2 10*3/uL (ref 0.1–1.0)
Monocytes Relative: 6 %
Neutro Abs: 2.9 10*3/uL (ref 1.7–7.7)
Neutrophils Relative %: 72 %
Platelets: 275 10*3/uL (ref 150–400)
RBC: 3.15 MIL/uL — ABNORMAL LOW (ref 4.22–5.81)
RDW: 14.7 % (ref 11.5–15.5)
WBC: 4 10*3/uL (ref 4.0–10.5)
nRBC: 0 % (ref 0.0–0.2)

## 2022-11-10 LAB — ALBUMIN, PLEURAL OR PERITONEAL FLUID: Albumin, Fluid: 1.8 g/dL

## 2022-11-10 LAB — TROPONIN I (HIGH SENSITIVITY): Troponin I (High Sensitivity): 21 ng/L — ABNORMAL HIGH (ref <18)

## 2022-11-10 LAB — PROTEIN, PLEURAL OR PERITONEAL FLUID: Total protein, fluid: 3.6 g/dL

## 2022-11-10 LAB — AMYLASE, PLEURAL OR PERITONEAL FLUID: Amylase, Fluid: 20 U/L

## 2022-11-10 LAB — GLUCOSE, CAPILLARY
Glucose-Capillary: 136 mg/dL — ABNORMAL HIGH (ref 70–99)
Glucose-Capillary: 247 mg/dL — ABNORMAL HIGH (ref 70–99)

## 2022-11-10 LAB — GLUCOSE, PLEURAL OR PERITONEAL FLUID: Glucose, Fluid: 157 mg/dL

## 2022-11-10 LAB — TSH: TSH: 3.093 u[IU]/mL (ref 0.350–4.500)

## 2022-11-10 LAB — LACTATE DEHYDROGENASE, PLEURAL OR PERITONEAL FLUID: LD, Fluid: 76 U/L — ABNORMAL HIGH (ref 3–23)

## 2022-11-10 LAB — CORTISOL: Cortisol, Plasma: 20.5 ug/dL

## 2022-11-10 MED ORDER — SODIUM CHLORIDE 0.9 % IV SOLN
1.0000 g | INTRAVENOUS | Status: DC
  Administered 2022-11-10 – 2022-11-11 (×2): 1 g via INTRAVENOUS
  Filled 2022-11-10 (×2): qty 10

## 2022-11-10 MED ORDER — CHLORHEXIDINE GLUCONATE CLOTH 2 % EX PADS
6.0000 | MEDICATED_PAD | Freq: Every day | CUTANEOUS | Status: DC
  Administered 2022-11-10 – 2022-11-13 (×4): 6 via TOPICAL
  Filled 2022-11-10: qty 6

## 2022-11-10 MED ORDER — ALBUMIN HUMAN 25 % IV SOLN
25.0000 g | INTRAVENOUS | Status: AC
  Administered 2022-11-10: 25 g via INTRAVENOUS
  Filled 2022-11-10: qty 100

## 2022-11-10 MED ORDER — SODIUM ZIRCONIUM CYCLOSILICATE 10 G PO PACK
10.0000 g | PACK | Freq: Once | ORAL | Status: AC
  Administered 2022-11-10: 10 g via ORAL
  Filled 2022-11-10: qty 1

## 2022-11-10 MED ORDER — SODIUM CHLORIDE 0.9% FLUSH
3.0000 mL | Freq: Two times a day (BID) | INTRAVENOUS | Status: DC
  Administered 2022-11-10 – 2022-11-16 (×13): 3 mL via INTRAVENOUS

## 2022-11-10 MED ORDER — ACETAMINOPHEN 325 MG PO TABS: 650.0000 mg | ORAL_TABLET | Freq: Four times a day (QID) | ORAL | Status: DC | PRN

## 2022-11-10 MED ORDER — ONDANSETRON HCL 4 MG/2ML IJ SOLN: 4.0000 mg | Freq: Four times a day (QID) | INTRAMUSCULAR | Status: DC | PRN

## 2022-11-10 MED ORDER — INSULIN ASPART 100 UNIT/ML IJ SOLN
0.0000 [IU] | INTRAMUSCULAR | Status: DC
  Administered 2022-11-11 (×2): 2 [IU] via SUBCUTANEOUS
  Administered 2022-11-11: 6 [IU] via SUBCUTANEOUS
  Filled 2022-11-10 (×3): qty 1

## 2022-11-10 MED ORDER — ONDANSETRON HCL 4 MG PO TABS: 4.0000 mg | ORAL_TABLET | Freq: Four times a day (QID) | ORAL | Status: DC | PRN

## 2022-11-10 MED ORDER — LIDOCAINE HCL (PF) 1 % IJ SOLN
10.0000 mL | Freq: Once | INTRAMUSCULAR | Status: AC
Start: 2022-11-10 — End: 2022-11-10
  Administered 2022-11-10: 10 mL via INTRADERMAL

## 2022-11-10 MED ORDER — ACETAMINOPHEN 650 MG RE SUPP: 650.0000 mg | Freq: Four times a day (QID) | RECTAL | Status: DC | PRN

## 2022-11-10 MED ORDER — HEPARIN SODIUM (PORCINE) 5000 UNIT/ML IJ SOLN
5000.0000 [IU] | Freq: Three times a day (TID) | INTRAMUSCULAR | Status: DC
  Administered 2022-11-10: 5000 [IU] via SUBCUTANEOUS
  Filled 2022-11-10 (×7): qty 1

## 2022-11-10 MED ORDER — ONDANSETRON HCL 4 MG/2ML IJ SOLN
4.0000 mg | Freq: Once | INTRAMUSCULAR | Status: AC
  Administered 2022-11-10: 4 mg via INTRAVENOUS
  Filled 2022-11-10: qty 2

## 2022-11-10 MED ORDER — SODIUM CHLORIDE 0.9% FLUSH: 3.0000 mL | INTRAVENOUS | Status: DC | PRN

## 2022-11-10 MED ORDER — NEPRO/CARBSTEADY PO LIQD
237.0000 mL | Freq: Two times a day (BID) | ORAL | Status: DC
  Administered 2022-11-11 (×2): 237 mL via ORAL

## 2022-11-10 MED ORDER — DEXTROSE 50 % IV SOLN
1.0000 | Freq: Once | INTRAVENOUS | Status: AC
  Administered 2022-11-10: 50 mL via INTRAVENOUS
  Filled 2022-11-10: qty 50

## 2022-11-10 MED ORDER — METRONIDAZOLE 500 MG/100ML IV SOLN
500.0000 mg | Freq: Two times a day (BID) | INTRAVENOUS | Status: DC
  Administered 2022-11-10 – 2022-11-11 (×2): 500 mg via INTRAVENOUS
  Filled 2022-11-10 (×2): qty 100

## 2022-11-10 MED ORDER — SODIUM CHLORIDE 0.9 % IV SOLN: 250.0000 mL | INTRAVENOUS | Status: AC | PRN

## 2022-11-10 MED ORDER — HEPARIN SODIUM (PORCINE) 1000 UNIT/ML DIALYSIS
1000.0000 [IU] | INTRAMUSCULAR | Status: DC | PRN
  Administered 2022-11-11: 3600 [IU]
  Administered 2022-11-12 (×2): 1000 [IU]
  Filled 2022-11-10 (×7): qty 1

## 2022-11-10 MED ORDER — ALTEPLASE 2 MG IJ SOLR: 2.0000 mg | Freq: Once | INTRAMUSCULAR | Status: DC | PRN

## 2022-11-10 NOTE — Progress Notes (Signed)
[facility]  ROUNDING NOTE   Subjective:   Shawn [name] is a 67 y.o. male with past medical conditions including end stage renal disease on hemodialysis, hypertension, diabetes mellitus type II, congestive heart failure, coronary artery disease, CVA, alcohol abuse, tobacco abuse and hypothyroidism. He presents to the ED with hypoglycemia. Patient has been admitted for Hypervolemia [E87.70]  Patient is known to our practice and receives outpatient dialysis treatments at [facility] on a MWF schedule, supervised by Dr Thedore Mins.  Last reported dialysis treatment on Monday.  Patient is seen laying on stretcher, Lawyer in place for hypothermia.  Patient easily aroused and requesting meal tray.  Denies pain or discomfort.  Denies chest pain or cough.  Prominent lower extremity edema present.  Patient remains on room air  Labs on ED arrival include sodium 130, potassium 5.6, BUN 108, creatinine 8.01 with GFR 7, albumin 2.7 and hemoglobin 9.6.  CT abdomen pelvis denotes moderate to large volume ascites.  Recommend paracentesis evaluation.  We have been consulted to provide hemodialysis.   Objective:  Vital signs in last 24 hours:  Temp:  [95 F (35 C)-97.5 F (36.4 C)] 97.4 F (36.3 C) (11/06 1347) Pulse Rate:  [56-113] 57 (11/06 1541) Resp:  [9-19] 12 (11/06 1500) BP: (93-125)/(53-80) 93/53 (11/06 1541) SpO2:  [97 %-100 %] 100 % (11/06 1541) Weight:  [65.3 kg] 65.3 kg (11/06 0307)  Weight change:  Filed Weights   11/10/22 0307  Weight: 65.3 kg    Intake/Output: No intake/output data recorded.   Intake/Output this shift:  No intake/output data recorded.  Physical Exam: General: NAD  Head: Normocephalic, atraumatic. Moist oral mucosal membranes  Eyes: Anicteric  Lungs:  Clear to auscultation, room air  Heart: Regular rate and rhythm  Abdomen:  Soft, nontender  Extremities: 2-3+ peripheral edema.  Neurologic: Alert moving all four extremities  Skin:  No lesions  Access: Right PermCath    Basic Metabolic Panel: Recent Labs  Lab 11/10/22 0325  NA 130*  K 5.6*  CL 97*  CO2 22  GLUCOSE 136*  BUN 108*  CREATININE 8.01*  CALCIUM 8.2*    Liver Function Tests: Recent Labs  Lab 11/10/22 0325  AST 29  ALT 19  ALKPHOS 98  BILITOT 0.8  PROT 6.5  ALBUMIN 2.7*   Recent Labs  Lab 11/10/22 0325  LIPASE 21   No results for input(s): "AMMONIA" in the last 168 hours.  CBC: Recent Labs  Lab 11/10/22 0325  WBC 4.0  NEUTROABS 2.9  HGB 9.6*  HCT 29.6*  MCV 94.0  PLT 275    Cardiac Enzymes: No results for input(s): "CKTOTAL", "CKMB", "CKMBINDEX", "TROPONINI" in the last 168 hours.  BNP: Invalid input(s): "POCBNP"  CBG: Recent Labs  Lab 11/10/22 0640 11/10/22 0747  GLUCAP 62* 188*    Microbiology: Results for orders placed or performed during the hospital encounter of 10/27/22  SARS Coronavirus 2 by RT PCR (hospital order, performed in [facility] hospital lab) *cepheid single result test* Anterior Nasal Swab     Status: None   Collection Time: 10/27/22  6:37 PM   Specimen: Anterior Nasal Swab  Result Value Ref Range Status   SARS Coronavirus 2 by RT PCR NEGATIVE NEGATIVE Final    Comment: (NOTE) SARS-CoV-2 target nucleic acids are NOT DETECTED.  The SARS-CoV-2 RNA is generally detectable in upper and lower respiratory specimens during the acute phase of infection. The lowest concentration of SARS-CoV-2 viral copies this assay can detect is  250 copies / mL. A negative result does not preclude SARS-CoV-2 infection and should not be used as the sole basis for treatment or other patient management decisions.  A negative result may occur with improper specimen collection / handling, submission of specimen other than nasopharyngeal swab, presence of viral mutation(s) within the areas targeted by this assay, and inadequate number of viral copies (<250 copies / mL). A negative result must be combined with  clinical observations, patient history, and epidemiological information.  Fact Sheet for Patients:   RoadLapTop.co.za  Fact Sheet for Healthcare Providers: http://kim-miller.com/  This test is not yet approved or  cleared by the [place] FDA and has been authorized for detection and/or diagnosis of SARS-CoV-2 by FDA under an Emergency Use Authorization (EUA).  This EUA will remain in effect (meaning this test can be used) for the duration of the COVID-19 declaration under Section 564(b)(1) of the Act, 21 U.S.C. section 360bbb-3(b)(1), unless the authorization is terminated or revoked sooner.  Performed at [facility], [address] [place], [place] [address]   MRSA Next Gen by PCR, Nasal     Status: None   Collection Time: 10/27/22  9:04 PM   Specimen: Nasal Mucosa; Nasal Swab  Result Value Ref Range Status   MRSA by PCR Next Gen NOT DETECTED NOT DETECTED Final    Comment: (NOTE) The GeneXpert MRSA Assay (FDA approved for NASAL specimens only), is one component of a comprehensive MRSA colonization surveillance program. It is not intended to diagnose MRSA infection nor to guide or monitor treatment for MRSA infections. Test performance is not FDA approved in patients less than 24 years old. Performed at [facility], [address] [place], [place] [address]   Culture, blood (Routine X 2) w Reflex to ID Panel     Status: None   Collection Time: 10/28/22  9:38 AM   Specimen: BLOOD RIGHT HAND  Result Value Ref Range Status   Specimen Description BLOOD RIGHT HAND  Final   Special Requests   Final    BOTTLES DRAWN AEROBIC ONLY Blood Culture adequate volume   Culture   Final    NO GROWTH 5 DAYS Performed at [facility], [address] [place], [place] [address]    Report Status 11/02/2022 FINAL  Final  Culture, blood (Routine X 2) w Reflex to ID Panel     Status: None   Collection Time:  10/28/22 10:50 AM   Specimen: BLOOD  Result Value Ref Range Status   Specimen Description BLOOD RIGHT HAND  Final   Special Requests   Final    BOTTLES DRAWN AEROBIC AND ANAEROBIC Blood Culture results may not be optimal due to an inadequate volume of blood received in culture bottles   Culture   Final    NO GROWTH 5 DAYS Performed at [facility], [address] [place], [place] [address]    Report Status 11/02/2022 FINAL  Final    Coagulation Studies: No results for input(s): "LABPROT", "INR" in the last 72 hours.  Urinalysis: No results for input(s): "COLORURINE", "LABSPEC", "PHURINE", "GLUCOSEU", "HGBUR", "BILIRUBINUR", "KETONESUR", "PROTEINUR", "UROBILINOGEN", "NITRITE", "LEUKOCYTESUR" in the last 72 hours.  Invalid input(s): "APPERANCEUR"    Imaging: CT ABDOMEN PELVIS WO CONTRAST  Result Date: 11/10/2022 CLINICAL DATA:  67 year old male with abdominal pain and hypoglycemia. History of ascites, paracentesis last month. EXAM: CT ABDOMEN AND PELVIS WITHOUT CONTRAST TECHNIQUE: Multidetector CT imaging of the abdomen and pelvis was performed following the standard protocol without  IV contrast. RADIATION DOSE REDUCTION: This exam was performed according to the departmental dose-optimization program which includes automated exposure control, adjustment of the mA and/or kV according to patient size and/or use of iterative reconstruction technique. COMPARISON:  Noncontrast CT Abdomen and Pelvis 08/28/2022. FINDINGS: Lower chest: Increased layering bilateral pleural effusions since August with simple fluid density. Moderate volume pleural fluid now with compressive lung base atelectasis. Underlying cardiomegaly not significantly changed. Dual lumen dialysis type vascular catheter visible at the cavoatrial junction. No pericardial effusion. Hepatobiliary: Moderate to large volume ascites with fairly simple fluid density. Fluid volume increased compared to 08/28/2022. Underlying volume  of the noncontrast liver seems diminished, although the liver contour is not obviously nodular. Unremarkable gallbladder. Pancreas: Limited detail, no abnormality evident. Spleen: Diminutive and unremarkable series 2, image 28. Adrenals/Urinary Tract: Limited detail. Noncontrast adrenal glands, kidneys appear symmetric and unremarkable. Small volume of fluid within the urinary bladder which is mostly displaced toward the anterior lower abdominal wall. See sagittal image 55. Stomach/Bowel: Fluid-filled stomach, but no other dilated bowel loops in the abdomen or pelvis. Limited bowel detail due to abundant ascites and lack of contrast. However, there is evidence of an air-filled retrocecal appendix located along the right gutter series 2, image 53. No pneumoperitoneum. Vascular/Lymphatic: Severe Aortoiliac calcified atherosclerosis. Normal caliber abdominal aorta. Vascular patency is not evaluated in the absence of IV contrast. No lymphadenopathy is evident. Reproductive: Ascites tracking in both inguinal canals. Other: Moderate to large volume ascites in the pelvis. Simple fluid density. Musculoskeletal: Stable.  No acute osseous abnormality identified. IMPRESSION: 1. Moderate to large volume Ascites, increased compared to August CT. Also increased and now moderate bilateral layering pleural effusions with lung base atelectasis. 2. Fluid distended stomach, but other bowel loops are decompressed throughout the abdomen and pelvis. NG tube decompression might be valuable. 3. Cardiomegaly.  Advanced Aortic Atherosclerosis (ICD10-I70.0). 4. No other acute or inflammatory process evident on this noncontrast exam. Electronically Signed   By: Odessa Fleming M.D.   On: 11/10/2022 04:31     Medications:    sodium chloride     cefTRIAXone (ROCEPHIN)  IV Stopped (11/10/22 1543)   metronidazole 500 mg (11/10/22 1636)     Chlorhexidine Gluconate Cloth  6 each Topical Q0600   heparin  5,000 Units Subcutaneous Q8H   insulin  aspart  0-6 Units Subcutaneous Q4H   sodium chloride flush  3 mL Intravenous Q12H   sodium chloride, acetaminophen **OR** acetaminophen, alteplase, heparin, ondansetron **OR** ondansetron (ZOFRAN) IV, sodium chloride flush  Assessment/ Plan:  Mr. LUDWIG TUGWELL is a 67 y.o.  male with end stage renal disease on hemodialysis, hypertension, diabetes mellitus type II, congestive heart failure, coronary artery disease, CVA, alcohol abuse, tobacco abuse and hypothyroidism   End-stage renal disease with hyperkalemia on hemodialysis.  Last dialysis treatment on Monday.  Potassium on ED arrival 5.6.  Will order hemodialysis on 2K bath to manage this.  Patient will receive dialysis later today  2. Fluid volume overload, Moderate to large ascites seen on CT abd and pelvis. Lower extremity edema present. Paracentesis ordered. Will manage fluid with hemodialysis.   3.  Hypoglycemia with diabetes mellitus type II with chronic kidney disease with diabetic ketoacidosis: Insulin-dependent with Lantus.  Glucose in the 30s on EMS arrival.  Was treated with dextrose.  4. Anemia of chronic kidney disease Lab Results  Component Value Date   HGB 9.6 (L) 11/10/2022    Patient receives Mircera at outpatient clinic. Will monitor for now.  5.  Hypertension with chronic kidney disease.  Home regimen includes carvedilol and furosemide.  Now currently held    LOS: 0 Lina Hitch 11/6/20245:15 PM

## 2022-11-10 NOTE — ED Notes (Signed)
Pt BG 68. Pt provided with OJ and meal tray. Will recheck BG.

## 2022-11-10 NOTE — Assessment & Plan Note (Signed)
BP stable Titrate home regimen 

## 2022-11-10 NOTE — ED Notes (Signed)
Pt aox4 speaking infull clear sentences reports makes a small amount of urine pt provided urinal per Dr Elesa Massed pt ok to have PO food liquid pt request ensure

## 2022-11-10 NOTE — Assessment & Plan Note (Signed)
2D echo June 2023 with a EF of 20 to 25% and grade 2 diastolic dysfunction Positive volume overload in setting of baseline ESRD-dialysis dependent volume status Pending hemodialysis Will plan for repeat 2D echo Trop pending  Ascites also confounding issue Cardiology consult as clinically indicated

## 2022-11-10 NOTE — Assessment & Plan Note (Signed)
K5.6 at present EKG stable Pending hemodialysis today Status post Lokelma in the ER Monitor

## 2022-11-10 NOTE — Assessment & Plan Note (Addendum)
Blood sugar in the 60s on presentation Noted baseline noncompliance as well as admission for DKA October 2024 Some concern for labile blood sugar versus brittle diabetes Patient self admits to?  Excess insulin use over the past 24 hours Blood sugar improved into the 180s with D50 Monitor blood sugar closely

## 2022-11-10 NOTE — Assessment & Plan Note (Signed)
Patient denies any active alcohol use Check alcohol level and urine drug screen Monitor

## 2022-11-10 NOTE — Assessment & Plan Note (Signed)
?   Statin.

## 2022-11-10 NOTE — ED Provider Notes (Addendum)
[facility] Provider Note    Event Date/Time   First MD Initiated Contact with Patient 11/10/22 0257     (approximate)   History   No chief complaint on file.   HPI  Shawn [name] is a 67 y.o. male with history of CHF, end-stage renal disease on hemodialysis Monday, Wednesday and Friday, hypertension, insulin-dependent diabetes who presents to the emergency department EMS for concerns for hypoglycemia.  Patient called EMS due to low blood sugar.  Blood sugar the 30s with EMS.  He reports he has been vomiting all day today not been able to eat and drink normally.  He feels his abdomen is more distended than normal.  He is unable to tell me when he last had a paracentesis.  He is also not sure when he last had dialysis as he states it was Wednesday but today is Wednesday.  He denies any chest pain or shortness of breath currently.  No fever.  Still makes urine.   History provided by patient, EMS.    Past Medical History:  Diagnosis Date   Chronic combined systolic (congestive) and diastolic (congestive) heart failure ([name])    a. TTE 6/15: EF < 20%, mildly dilated LV, DD, mildly dilated LA, mod dilated RA, mild MR, mild to mod TR, mod increased posterior wall thickness, elevated LA and LVEDP; b. 4/12019 Echo: EF 20-25%, diff HK. Gr1 DD, nl RV fxn.   CKD (chronic kidney disease), stage II    Diabetes mellitus with complication ([name])    a. Prior admissions w/ DKA (last 12/2017).   Essential hypertension    NICM (nonischemic cardiomyopathy) ([name])    a. 06/2013 Echo: EF<20%; b. 06/2013 Cath: no signif dzs; c. 04/2017 Echo: 20-25%, gr1 DD.; d.05/2018 Echo: 25-30%   Polysubstance abuse ([name])    a. etoh and tobacco   Stroke [facility])     Past Surgical History:  Procedure Laterality Date   CENTRAL LINE INSERTION N/A 06/29/2021   Procedure: CENTRAL LINE INSERTION;  Surgeon: Yvonne Kendall, MD;  Location: [facility] INVASIVE CV LAB;  Service: Cardiovascular;  Laterality:  N/A;   IR PARACENTESIS  09/09/2022   NO PAST SURGERIES     RIGHT HEART CATH N/A 06/29/2021   Procedure: RIGHT HEART CATH;  Surgeon: Yvonne Kendall, MD;  Location: [facility] INVASIVE CV LAB;  Service: Cardiovascular;  Laterality: N/A;    MEDICATIONS:  Prior to Admission medications   Medication Sig Start Date End Date Taking? Authorizing Provider  acetaminophen (TYLENOL) 325 MG tablet Take 2 tablets (650 mg total) by mouth every 6 (six) hours as needed for mild pain (or Fever >/= 101). 01/09/21   Dorcas Carrow, MD  ammonium lactate (LAC-HYDRIN) 12 % lotion Apply 1 Application topically as needed for dry skin.    [provider]  aspirin EC 81 MG tablet Take 81 mg by mouth daily.    [provider]  atorvastatin (LIPITOR) 80 MG tablet Take 40 mg by mouth at bedtime.    [provider]  atropine 1 % ophthalmic solution Place 1 drop into the right eye 2 (two) times daily. 10/19/22   [provider]  carvedilol (COREG) 6.25 MG tablet Take 1 tablet (6.25 mg total) by mouth 2 (two) times daily. 11/01/22 11/01/23  Lucile Shutters, MD  cholecalciferol (VITAMIN D) 25 MCG (1000 UNIT) tablet Take 2,000 Units by mouth daily.    [provider]  dextrose (GLUTOSE) 40 % GEL Take 1 Tube by mouth once as needed  for low blood sugar.    [provider]  erythromycin ophthalmic ointment Place 1 Application into the right eye at bedtime. 10/19/22   [provider]  hydrOXYzine (ATARAX) 10 MG tablet Take 10 mg by mouth daily as needed for itching or anxiety.    [provider]  insulin aspart (NOVOLOG) 100 UNIT/ML FlexPen Inject 3 Units into the skin 3 (three) times daily with meals. 11/01/22   Agbata, Tochukwu, MD  insulin glargine-yfgn (SEMGLEE) 100 UNIT/ML injection Inject 0.05 mLs (5 Units total) into the skin daily at 12 noon. 08/31/22   Enedina Finner, MD  levothyroxine (SYNTHROID) 75 MCG tablet Take 75 mcg by mouth daily before breakfast.     [provider]  Multiple Vitamin (MULTIVITAMIN WITH MINERALS) TABS tablet Take 1 tablet by mouth daily. 01/10/21   Dorcas Carrow, MD  omeprazole (PRILOSEC) 20 MG capsule Take 20 mg by mouth daily.    [provider]  oxyCODONE (OXY IR/ROXICODONE) 5 MG immediate release tablet Take 5 mg by mouth every 6 (six) hours as needed for moderate pain (pain score 4-6) or severe pain (pain score 7-10). 09/29/22   [provider]  prednisoLONE acetate (PRED FORTE) 1 % ophthalmic suspension Place 1 drop into the right eye as directed. 10/19/22   [provider]  tamsulosin (FLOMAX) 0.4 MG CAPS capsule Take 1 capsule (0.4 mg total) by mouth daily after supper. 09/09/22   Sunnie Nielsen, DO  torsemide (DEMADEX) 20 MG tablet Take 60 mg by mouth as directed. Take 3 tablets (60mg ) by mouth every Sunday, Tuesday, Thursday and Saturday    [provider]    Physical Exam   Triage Vital Signs: ED Triage Vitals  Encounter Vitals Group     BP 11/10/22 0305 118/71     Systolic BP Percentile --      Diastolic BP Percentile --      Pulse Rate 11/10/22 0305 (!) 56     Resp 11/10/22 0305 15     Temp 11/10/22 0305 (!) 97.5 F (36.4 C)     Temp Source 11/10/22 0305 Oral     SpO2 11/10/22 0305 100 %     Weight 11/10/22 0307 144 lb (65.3 kg)     Height --      Head Circumference --      Peak Flow --      Pain Score --      Pain Loc --      Pain Education --      Exclude from Growth Chart --      Most recent vital signs: Vitals:   11/10/22 0400 11/10/22 0415  BP: 116/72   Pulse: (!) 59 (!) 56  Resp: 16 17  Temp:    SpO2: 98% 99%    CONSTITUTIONAL: Alert, responds appropriately to questions.  Chronically ill-appearing, thin, frail in appearance, appears older than stated age HEAD: Normocephalic, atraumatic EYES: Conjunctivae clear, pupils appear equal, sclera nonicteric, ecchymosis under the right eye from recent cataract surgery ENT: normal nose; moist  mucous membranes NECK: Supple, normal ROM CARD: RRR; S1 and S2 appreciated, dialysis catheter noted to the right chest wall RESP: Normal chest excursion without splinting or tachypnea; breath sounds clear and equal bilaterally; no wheezes, no rhonchi, no rales, no hypoxia or respiratory distress, speaking full sentences ABD/GI: Abdomen distended with fluid wave without tympany.  Not significantly tender to palpation.  No guarding or rebound. BACK: The back appears normal EXT: Normal ROM in all joints;  no deformity noted, no edema SKIN: Normal color for age and race; warm; no rash on exposed skin NEURO: Moves all extremities equally, normal speech PSYCH: The patient's mood and manner are appropriate.   ED Results / Procedures / Treatments   LABS: (all labs ordered are listed, but only abnormal results are displayed) Labs Reviewed  CBC WITH DIFFERENTIAL/PLATELET - Abnormal; Notable for the following components:      Result Value   RBC 3.15 (*)    Hemoglobin 9.6 (*)    HCT 29.6 (*)    All other components within normal limits  COMPREHENSIVE METABOLIC PANEL - Abnormal; Notable for the following components:   Sodium 130 (*)    Potassium 5.6 (*)    Chloride 97 (*)    Glucose, Bld 136 (*)    BUN 108 (*)    Creatinine, Ser 8.01 (*)    Calcium 8.2 (*)    Albumin 2.7 (*)    GFR, Estimated 7 (*)    All other components within normal limits  LIPASE, BLOOD  URINALYSIS, ROUTINE W REFLEX MICROSCOPIC  CBG MONITORING, ED  CBG MONITORING, ED     EKG:  EKG Interpretation Date/Time:  Wednesday November 10 2022 05:18:19 EST Ventricular Rate:  70 PR Interval:  211 QRS Duration:  125 QT Interval:  440 QTC Calculation: 475 R Axis:   9  Text Interpretation: Sinus rhythm IVCD, consider atypical LBBB Confirmed by Rochele Raring [phone]) on 11/10/2022 5:28:36 AM         RADIOLOGY: My personal review and interpretation of imaging: CT shows ascites, bilateral pleural effusions.  I have  personally reviewed all radiology reports.   CT ABDOMEN PELVIS WO CONTRAST  Result Date: 11/10/2022 CLINICAL DATA:  67 year old male with abdominal pain and hypoglycemia. History of ascites, paracentesis last month. EXAM: CT ABDOMEN AND PELVIS WITHOUT CONTRAST TECHNIQUE: Multidetector CT imaging of the abdomen and pelvis was performed following the standard protocol without IV contrast. RADIATION DOSE REDUCTION: This exam was performed according to the departmental dose-optimization program which includes automated exposure control, adjustment of the mA and/or kV according to patient size and/or use of iterative reconstruction technique. COMPARISON:  Noncontrast CT Abdomen and Pelvis 08/28/2022. FINDINGS: Lower chest: Increased layering bilateral pleural effusions since August with simple fluid density. Moderate volume pleural fluid now with compressive lung base atelectasis. Underlying cardiomegaly not significantly changed. Dual lumen dialysis type vascular catheter visible at the cavoatrial junction. No pericardial effusion. Hepatobiliary: Moderate to large volume ascites with fairly simple fluid density. Fluid volume increased compared to 08/28/2022. Underlying volume of the noncontrast liver seems diminished, although the liver contour is not obviously nodular. Unremarkable gallbladder. Pancreas: Limited detail, no abnormality evident. Spleen: Diminutive and unremarkable series 2, image 28. Adrenals/Urinary Tract: Limited detail. Noncontrast adrenal glands, kidneys appear symmetric and unremarkable. Small volume of fluid within the urinary bladder which is mostly displaced toward the anterior lower abdominal wall. See sagittal image 55. Stomach/Bowel: Fluid-filled stomach, but no other dilated bowel loops in the abdomen or pelvis. Limited bowel detail due to abundant ascites and lack of contrast. However, there is evidence of an air-filled retrocecal appendix located along the right gutter series 2, image  53. No pneumoperitoneum. Vascular/Lymphatic: Severe Aortoiliac calcified atherosclerosis. Normal caliber abdominal aorta. Vascular patency is not evaluated in the absence of IV contrast. No lymphadenopathy is evident. Reproductive: Ascites tracking in both inguinal canals. Other: Moderate to large volume ascites in the pelvis. Simple fluid density. Musculoskeletal: Stable.  No acute osseous abnormality identified.  IMPRESSION: 1. Moderate to large volume Ascites, increased compared to August CT. Also increased and now moderate bilateral layering pleural effusions with lung base atelectasis. 2. Fluid distended stomach, but other bowel loops are decompressed throughout the abdomen and pelvis. NG tube decompression might be valuable. 3. Cardiomegaly.  Advanced Aortic Atherosclerosis (ICD10-I70.0). 4. No other acute or inflammatory process evident on this noncontrast exam. Electronically Signed   By: Odessa Fleming M.D.   On: 11/10/2022 04:31     PROCEDURES:  Critical Care performed: Yes, see critical care procedure note(s)   CRITICAL CARE Performed by: Baxter Hire Helina Hullum   Total critical care time: 40 minutes  Critical care time was exclusive of separately billable procedures and treating other patients.  Critical care was necessary to treat or prevent imminent or life-threatening deterioration.  Critical care was time spent personally by me on the following activities: development of treatment plan with patient and/or surrogate as well as nursing, discussions with consultants, evaluation of patient's response to treatment, examination of patient, obtaining history from patient or surrogate, ordering and performing treatments and interventions, ordering and review of laboratory studies, ordering and review of radiographic studies, pulse oximetry and re-evaluation of patient's condition.   Marland Kitchen1-3 Lead EKG Interpretation  Performed by: Braelynn Benning, Layla Maw, DO Authorized by: Brystal Kildow, Layla Maw, DO     Interpretation:  abnormal     ECG rate:  56   ECG rate assessment: bradycardic     Rhythm: sinus bradycardia     Ectopy: none     Conduction: normal       IMPRESSION / MDM / ASSESSMENT AND PLAN / ED COURSE  I reviewed the triage vital signs and the nursing notes.    Patient here with vomiting, abdominal swelling and discomfort, hypoglycemia.  The patient is on the cardiac monitor to evaluate for evidence of arrhythmia and/or significant heart rate changes.   DIFFERENTIAL DIAGNOSIS (includes but not limited to):   Hypoglycemia likely secondary to poor p.o. intake, UTI, worsening renal function, ascites, less likely SBP, appendicitis, colitis, diverticulitis, bowel obstruction   Patient's presentation is most consistent with acute presentation with potential threat to life or bodily function.   PLAN: Will obtain labs, CT of the abdomen pelvis, urine.  Will monitor blood sugar.  Will allow him to eat and drink.  Will give Zofran for nausea.   MEDICATIONS GIVEN IN ED: Medications  sodium zirconium cyclosilicate (LOKELMA) packet 10 g (has no administration in time range)  ondansetron (ZOFRAN) injection 4 mg (4 mg Intravenous Given 11/10/22 0359)     ED COURSE: Patient's labs show chronic anemia which is stable.  Potassium of 5.6.  Will place on cardiac monitor and obtain EKG.  Will give Lokelma.  LFTs and lipase normal.  CT scan reviewed and interpreted by myself and the radiologist and shows large volume ascites and moderate bilateral pleural effusions.  Patient is unable to tell me his last dialysis session or paracentesis.  Given his hyperkalemia, volume overload, vomiting with hypoglycemia, I feel he will need admission for further monitoring and dialysis.  Patient is comfortable with this plan.   CT scan also shows distended stomach.  He states he is feeling better and has been able to tolerate p.o.  Will hold on NG tube at this time.  He has not vomited in the ED.   6:41 AM I was informed by  nursing staff that pt's blood sugar is now 62.  Will order an amp of D50.  Hospitalist unavailable on  secure chat for update.  CONSULTS:  Consulted and discussed patient's case with hospitalist, Dr. Arville Care.  I have recommended admission and consulting physician agrees and will place admission orders.  Patient (and family if present) agree with this plan.   I reviewed all nursing notes, vitals, pertinent previous records.  All labs, EKGs, imaging ordered have been independently reviewed and interpreted by myself.    OUTSIDE RECORDS REVIEWED: Reviewed recent admission for DKA.       FINAL CLINICAL IMPRESSION(S) / ED DIAGNOSES   Final diagnoses:  Hypoglycemia  Hyperkalemia  Other ascites  Bilateral pleural effusion  Nausea and vomiting in adult     Rx / DC Orders   ED Discharge Orders     None        Note:  This document was prepared using Dragon voice recognition software and may include unintentional dictation errors.   Falana Clagg, Layla Maw, DO 11/10/22 0528    Yao Hyppolite, Layla Maw, DO 11/10/22 1610

## 2022-11-10 NOTE — ED Notes (Signed)
This RN in room to explain importance of procedure. Patient told he can eat immediately after procedure. Patient now willing to go. [place] called and they are on their way.

## 2022-11-10 NOTE — ED Notes (Signed)
Pt provided warm blanket x3 pt provided ensure x2 pt swallowing without apparent difficulty

## 2022-11-10 NOTE — ED Notes (Signed)
Pt soiled in urine and stool. This RN and EDT provided peri care to pt and full linen change. Rectal temperature obtained due to this RN being unable to get accurate oral temperature. Rectal temperature 95.0. MD made aware and Bair hugger placed on pt.

## 2022-11-10 NOTE — ED Notes (Signed)
Patient provided with [place] sandwich box. Patient refused [place] sandwich, stating that "that bread is too hard" and requesting peanut butter and crackers,.

## 2022-11-10 NOTE — ED Notes (Signed)
Pt A&Ox4 reporting that he knows when he has to use the bathroom but sometimes he cannot hold it. RN asked pt if pt needed to be cleaned up. Pt denies having BM at this time.

## 2022-11-10 NOTE — ED Notes (Signed)
Patient refusing to go for paracentesis until he eats. Patient educated on need for procedure. Still refusing procedure.

## 2022-11-10 NOTE — Assessment & Plan Note (Signed)
Noted baseline ESRD, HFrEF with general volume overload on presentation 1-2+ pitting edema bilaterally Moderate to large volume ascites on CT scan Will plan for dialysis with nephrology Paracentesis as volume status and blood pressure tolerate Will otherwise continue to monitor volume status closely

## 2022-11-10 NOTE — ED Notes (Addendum)
RN obtained oral temp 97.4. MD made aware.

## 2022-11-10 NOTE — Assessment & Plan Note (Addendum)
1/2 pack/day smoker Discussed cessation at length Nicotine patch

## 2022-11-10 NOTE — Assessment & Plan Note (Signed)
Hgb 9.6  At baseline  Follow

## 2022-11-10 NOTE — Discharge Planning (Signed)
ESTABLISHED HEMODIALYSIS Outpatient Facility  [facility]  [address] [place], [place] [address] [phone]  Schedule: MWF 11:20am  Patient is active and can resume schedule upon discharge.  Dimas Chyle Dialysis Coordinator II  [facility] Cell: [phone] eFax: [phone] [email or web address]

## 2022-11-10 NOTE — Assessment & Plan Note (Signed)
Patient reports multiple rounds of loose diarrhea over multiple days CT abdomen pelvis with no overt signs of colitis at present White count within normal limits Minimal to mild abdominal pain on presentation Will plan to place on IV Rocephin and Flagyl for infectious and SBP coverage given ascites GI panel and C. difficile ordered Will otherwise continue to follow closely

## 2022-11-10 NOTE — H&P (Addendum)
History and Physical    Patient: Shawn [name] ZOX:[id number] DOB: Mar 15, 1955 DOA: 11/10/2022 DOS: the patient was seen and examined on 11/10/2022 PCP: [facility]  Patient coming from: Home  Chief Complaint:  Chief Complaint  Patient presents with   Hypoglycemia    Pt arrives via ems from home c/c insulin dependant dialysis pt with recent loss of appetite possibly resulting in persistent hypoglycemia pt aox4 upon arrival requesting ensure rt upper chest dialysis port locked dialysis M,W,F    HPI: Shawn [name] is a 67 y.o. male with medical history significant of HFrEF, ESRD on hemodialysis Monday Wednesday Friday, substance abuse, history of CVA presenting with hypoglycemia, hypothermia, ascites, diarrhea.  Limited history in setting of generalized fatigue.  Per report, patient with worsening generalized malaise.  Patient was concerned about hypoglycemia.  Patient reports taking his insulin yesterday.  Patient unclear if he increase the dose.  Mild decreased p.o. intake.  No chest pain or shortness of breath.  Positive abdominal distention.  Baseline dialysis on Monday Wednesday Friday.  Patient reports having dialysis on Monday as appropriate.  No reported NSAID use.  No reported alcohol use.  Still smoking 1/4 to 1/2 pack/day.  Has had intermittent episodes of diarrhea.  Predominantly watery.  No reports of black or bloody stools.  Noted prior history of medical noncomplianc as well as admission at the end of October for DKA. Presented to the ER Tmax 95, heart rate 50s to 70s, BP stable.  Satting well on room air.  White count 4, hemoglobin 9.6, platelets 275, creatinine 8, glucose 60s to 136, potassium 5.6.  CT them pelvis with moderate to large volume ascites compared to August CT.  Also with bilateral layering pleural effusions.  Positive distended stomach and cardiomegaly. Review of Systems: As mentioned in the history of present illness. All other systems reviewed and  are negative. Past Medical History:  Diagnosis Date   Chronic combined systolic (congestive) and diastolic (congestive) heart failure ([name])    a. TTE 6/15: EF < 20%, mildly dilated LV, DD, mildly dilated LA, mod dilated RA, mild MR, mild to mod TR, mod increased posterior wall thickness, elevated LA and LVEDP; b. 4/12019 Echo: EF 20-25%, diff HK. Gr1 DD, nl RV fxn.   CKD (chronic kidney disease), stage II    Diabetes mellitus with complication ([name])    a. Prior admissions w/ DKA (last 12/2017).   Essential hypertension    NICM (nonischemic cardiomyopathy) ([name])    a. 06/2013 Echo: EF<20%; b. 06/2013 Cath: no signif dzs; c. 04/2017 Echo: 20-25%, gr1 DD.; d.05/2018 Echo: 25-30%   Polysubstance abuse ([name])    a. etoh and tobacco   Stroke [facility])    Past Surgical History:  Procedure Laterality Date   CENTRAL LINE INSERTION N/A 06/29/2021   Procedure: CENTRAL LINE INSERTION;  Surgeon: Yvonne Kendall, MD;  Location: [facility] INVASIVE CV LAB;  Service: Cardiovascular;  Laterality: N/A;   IR PARACENTESIS  09/09/2022   NO PAST SURGERIES     RIGHT HEART CATH N/A 06/29/2021   Procedure: RIGHT HEART CATH;  Surgeon: Yvonne Kendall, MD;  Location: [facility] INVASIVE CV LAB;  Service: Cardiovascular;  Laterality: N/A;   Social History:  reports that he has been smoking cigarettes. He has never used smokeless tobacco. He reports that he does not currently use alcohol. He reports that he does not use drugs.  Allergies  Allergen Reactions   Empagliflozin     Other Reaction(s): Acidosis   Pravastatin  Other reaction(s): Muscle pain   Simvastatin     Other reaction(s): Muscle pain    Family History  Problem Relation Age of Onset   Congestive Heart Failure Mother    Diabetes Mother    Congestive Heart Failure Brother     Prior to Admission medications   Medication Sig Start Date End Date Taking? Authorizing Provider  acetaminophen (TYLENOL) 325 MG tablet Take 2 tablets (650 mg total) by mouth every 6  (six) hours as needed for mild pain (or Fever >/= 101). 01/09/21  Yes Ghimire, Lyndel Safe, MD  ammonium lactate (LAC-HYDRIN) 12 % lotion Apply 1 Application topically as needed for dry skin.   Yes [provider]  aspirin EC 81 MG tablet Take 81 mg by mouth daily.   Yes [provider]  atorvastatin (LIPITOR) 80 MG tablet Take 40 mg by mouth at bedtime.   Yes [provider]  atropine 1 % ophthalmic solution Place 1 drop into the right eye 2 (two) times daily. 10/19/22  Yes [provider]  carvedilol (COREG) 6.25 MG tablet Take 1 tablet (6.25 mg total) by mouth 2 (two) times daily. 11/01/22 11/01/23 Yes Agbata, Tochukwu, MD  cholecalciferol (VITAMIN D) 25 MCG (1000 UNIT) tablet Take 2,000 Units by mouth daily.   Yes [provider]  dextrose (GLUTOSE) 40 % GEL Take 1 Tube by mouth once as needed for low blood sugar.   Yes [provider]  erythromycin ophthalmic ointment Place 1 Application into the right eye at bedtime. 10/19/22  Yes [provider]  gentamicin (GARAMYCIN) 0.3 % ophthalmic solution Place 1 drop into the right eye 4 (four) times daily. 10/26/22  Yes [provider]  hydrOXYzine (ATARAX) 10 MG tablet Take 10 mg by mouth daily as needed for itching or anxiety.   Yes [provider]  insulin aspart (NOVOLOG) 100 UNIT/ML FlexPen Inject 3 Units into the skin 3 (three) times daily with meals. 11/01/22  Yes Agbata, Tochukwu, MD  insulin glargine-yfgn (SEMGLEE) 100 UNIT/ML injection Inject 0.05 mLs (5 Units total) into the skin daily at 12 noon. 08/31/22  Yes Enedina Finner, MD  levothyroxine (SYNTHROID) 75 MCG tablet Take 75 mcg by mouth daily before breakfast.   Yes [provider]  Multiple Vitamin (MULTIVITAMIN WITH MINERALS) TABS tablet Take 1 tablet by mouth daily. 01/10/21  Yes Dorcas Carrow, MD  omeprazole (PRILOSEC) 20 MG capsule Take 20 mg by mouth daily.   Yes [provider]  oxyCODONE (OXY  IR/ROXICODONE) 5 MG immediate release tablet Take 5 mg by mouth every 6 (six) hours as needed for moderate pain (pain score 4-6) or severe pain (pain score 7-10). 09/29/22  Yes [provider]  prednisoLONE acetate (PRED FORTE) 1 % ophthalmic suspension Place 1 drop into the right eye as directed. 10/19/22  Yes [provider]  tamsulosin (FLOMAX) 0.4 MG CAPS capsule Take 1 capsule (0.4 mg total) by mouth daily after supper. 09/09/22  Yes Sunnie Nielsen, DO  torsemide (DEMADEX) 20 MG tablet Take 60 mg by mouth as directed. Take 3 tablets (60mg ) by mouth every Sunday, Tuesday, Thursday and Saturday   Yes [provider]    Physical Exam: Vitals:   11/10/22 0630 11/10/22 0700 11/10/22 0730 11/10/22 0811  BP: 114/74 115/78 125/80   Pulse: 63 66 64   Resp: 16 17 19    Temp:    (!) 95 F (35 C)  TempSrc:    Rectal  SpO2: 100% 100% 100%   Weight:  Physical Exam Constitutional:      Comments: Underweight    HENT:     Head: Normocephalic and atraumatic.     Nose: Nose normal.     Mouth/Throat:     Mouth: Mucous membranes are moist.  Eyes:     Pupils: Pupils are equal, round, and reactive to light.  Cardiovascular:     Rate and Rhythm: Normal rate and regular rhythm.  Pulmonary:     Effort: Pulmonary effort is normal.  Abdominal:     General: Bowel sounds are normal. There is distension.  Musculoskeletal:        General: Normal range of motion.  Skin:    General: Skin is warm.  Neurological:     General: No focal deficit present.  Psychiatric:        Mood and Affect: Mood normal.     Data Reviewed:  There are no new results to review at this time.  CT ABDOMEN PELVIS WO CONTRAST CLINICAL DATA:  67 year old male with abdominal pain and hypoglycemia. History of ascites, paracentesis last month.  EXAM: CT ABDOMEN AND PELVIS WITHOUT CONTRAST  TECHNIQUE: Multidetector CT imaging of the abdomen and pelvis was performed following the standard  protocol without IV contrast.  RADIATION DOSE REDUCTION: This exam was performed according to the departmental dose-optimization program which includes automated exposure control, adjustment of the mA and/or kV according to patient size and/or use of iterative reconstruction technique.  COMPARISON:  Noncontrast CT Abdomen and Pelvis 08/28/2022.  FINDINGS: Lower chest: Increased layering bilateral pleural effusions since August with simple fluid density. Moderate volume pleural fluid now with compressive lung base atelectasis. Underlying cardiomegaly not significantly changed. Dual lumen dialysis type vascular catheter visible at the cavoatrial junction. No pericardial effusion.  Hepatobiliary: Moderate to large volume ascites with fairly simple fluid density. Fluid volume increased compared to 08/28/2022. Underlying volume of the noncontrast liver seems diminished, although the liver contour is not obviously nodular. Unremarkable gallbladder.  Pancreas: Limited detail, no abnormality evident.  Spleen: Diminutive and unremarkable series 2, image 28.  Adrenals/Urinary Tract: Limited detail. Noncontrast adrenal glands, kidneys appear symmetric and unremarkable. Small volume of fluid within the urinary bladder which is mostly displaced toward the anterior lower abdominal wall. See sagittal image 55.  Stomach/Bowel: Fluid-filled stomach, but no other dilated bowel loops in the abdomen or pelvis. Limited bowel detail due to abundant ascites and lack of contrast. However, there is evidence of an air-filled retrocecal appendix located along the right gutter series 2, image 53. No pneumoperitoneum.  Vascular/Lymphatic: Severe Aortoiliac calcified atherosclerosis. Normal caliber abdominal aorta. Vascular patency is not evaluated in the absence of IV contrast. No lymphadenopathy is evident.  Reproductive: Ascites tracking in both inguinal canals.  Other: Moderate to large volume  ascites in the pelvis. Simple fluid density.  Musculoskeletal: Stable.  No acute osseous abnormality identified.  IMPRESSION: 1. Moderate to large volume Ascites, increased compared to August CT. Also increased and now moderate bilateral layering pleural effusions with lung base atelectasis.  2. Fluid distended stomach, but other bowel loops are decompressed throughout the abdomen and pelvis. NG tube decompression might be valuable.  3. Cardiomegaly.  Advanced Aortic Atherosclerosis (ICD10-I70.0).  4. No other acute or inflammatory process evident on this noncontrast exam.  Electronically Signed   By: Odessa Fleming M.D.   On: 11/10/2022 04:31  Lab Results  Component Value Date   WBC 4.0 11/10/2022   HGB 9.6 (L) 11/10/2022   HCT 29.6 (L) 11/10/2022   MCV  94.0 11/10/2022   PLT 275 11/10/2022   Last metabolic panel Lab Results  Component Value Date   GLUCOSE 136 (H) 11/10/2022   NA 130 (L) 11/10/2022   K 5.6 (H) 11/10/2022   CL 97 (L) 11/10/2022   CO2 22 11/10/2022   BUN 108 (H) 11/10/2022   CREATININE 8.01 (H) 11/10/2022   GFRNONAA 7 (L) 11/10/2022   CALCIUM 8.2 (L) 11/10/2022   PHOS 4.1 11/01/2022   PROT 6.5 11/10/2022   ALBUMIN 2.7 (L) 11/10/2022   BILITOT 0.8 11/10/2022   ALKPHOS 98 11/10/2022   AST 29 11/10/2022   ALT 19 11/10/2022   ANIONGAP 11 11/10/2022    Assessment and Plan: * Hypervolemia Noted baseline ESRD, HFrEF with general volume overload on presentation 1-2+ pitting edema bilaterally Moderate to large volume ascites on CT scan Will plan for dialysis with nephrology Paracentesis as volume status and blood pressure tolerate Will otherwise continue to monitor volume status closely  Hypoglycemia Blood sugar in the 60s on presentation Noted baseline noncompliance as well as admission for DKA October 2024 Some concern for labile blood sugar versus brittle diabetes Patient self admits to?  Excess insulin use over the past 24 hours Blood sugar  improved into the 180s with D50 Monitor blood sugar closely  ESRD on dialysis [facility]) Baseline ESRD on hemodialysis Monday Wednesday Friday Positive volume overload on presentation Patient reports going to dialysis on regular schedule as of Monday Will reach out to nephrology for hemodialysis given volume status Monitor  Hypothermia T 95.1 on presentation Noted hypoglycemia  Pending Bair hugger placement  Will check TSH and cortisol level  Does not meet sepsis criteria at present  Lactate level pending  Will place on GI coverage given abd distension w/ ascites as well as diarrhea  Will otherwise follow closely   Diarrhea Patient reports multiple rounds of loose diarrhea over multiple days CT abdomen pelvis with no overt signs of colitis at present White count within normal limits Minimal to mild abdominal pain on presentation Will plan to place on IV Rocephin and Flagyl for infectious and SBP coverage given ascites GI panel and C. difficile ordered Will otherwise continue to follow closely   Essential hypertension BP stable Titrate home regimen  Iron deficiency anemia Hgb 9.6  At baseline  Follow    Smoker 1/2 pack/day smoker Discussed cessation at length Nicotine patch  Alcohol abuse Patient denies any active alcohol use Check alcohol level and urine drug screen Monitor  HLD (hyperlipidemia) Statin  HFrEF (heart failure with reduced ejection fraction) ([name]) 2D echo June 2023 with a EF of 20 to 25% and grade 2 diastolic dysfunction Positive volume overload in setting of baseline ESRD-dialysis dependent volume status Pending hemodialysis Will plan for repeat 2D echo Trop pending  Ascites also confounding issue Cardiology consult as clinically indicated  Hyperkalemia K5.6 at present EKG stable Pending hemodialysis today Status post Lokelma in the ER Monitor      Advance Care Planning:   Code Status: Full Code   Consults: Nephrology, IR   Family  Communication: No family at the bedside   Severity of Illness: The appropriate patient status for this patient is INPATIENT. Inpatient status is judged to be reasonable and necessary in order to provide the required intensity of service to ensure the patient's safety. The patient's presenting symptoms, physical exam findings, and initial radiographic and laboratory data in the context of their chronic comorbidities is felt to place them at high risk for further clinical deterioration. Furthermore,  it is not anticipated that the patient will be medically stable for discharge from the hospital within 2 midnights of admission.   * I certify that at the point of admission it is my clinical judgment that the patient will require inpatient hospital care spanning beyond 2 midnights from the point of admission due to high intensity of service, high risk for further deterioration and high frequency of surveillance required.*  Author: Floydene Flock, MD 11/10/2022 8:56 AM  For on call review [email or web address].

## 2022-11-10 NOTE — Procedures (Signed)
PROCEDURE SUMMARY:  Successful ultrasound guided paracentesis from the right upper abdomen.   Yielded 2 L of golden-yellow fluid.  No immediate complications.  The patient tolerated the procedure well.   Specimen was sent for labs.  EBL < 1 mL    Kennieth Francois PA-C 11/10/2022 4:34 PM

## 2022-11-10 NOTE — Assessment & Plan Note (Signed)
Baseline ESRD on hemodialysis Monday Wednesday Friday Positive volume overload on presentation Patient reports going to dialysis on regular schedule as of Monday Will reach out to nephrology for hemodialysis given volume status Monitor

## 2022-11-10 NOTE — ED Notes (Signed)
Pt provided ensure per pt request pt swallowing ensure without apparent difficulty

## 2022-11-10 NOTE — Assessment & Plan Note (Signed)
T 95.1 on presentation Noted hypoglycemia  Pending Bair hugger placement  Will check TSH and cortisol level  Does not meet sepsis criteria at present  Lactate level pending  Will place on GI coverage given abd distension w/ ascites as well as diarrhea  Will otherwise follow closely

## 2022-11-11 DIAGNOSIS — E8770 Fluid overload, unspecified: Secondary | ICD-10-CM

## 2022-11-11 LAB — GLUCOSE, CAPILLARY
Glucose-Capillary: 131 mg/dL — ABNORMAL HIGH (ref 70–99)
Glucose-Capillary: 206 mg/dL — ABNORMAL HIGH (ref 70–99)
Glucose-Capillary: 414 mg/dL — ABNORMAL HIGH (ref 70–99)
Glucose-Capillary: 273 mg/dL — ABNORMAL HIGH (ref 70–99)
Glucose-Capillary: 139 mg/dL — ABNORMAL HIGH (ref 70–99)

## 2022-11-11 LAB — GASTROINTESTINAL PANEL BY PCR, STOOL (REPLACES STOOL CULTURE)

## 2022-11-11 LAB — CBC
HCT: 28.2 % — ABNORMAL LOW (ref 39.0–52.0)
Hemoglobin: 9.5 g/dL — ABNORMAL LOW (ref 13.0–17.0)
MCH: 30.1 pg (ref 26.0–34.0)
MCHC: 33.7 g/dL (ref 30.0–36.0)
MCV: 89.2 fL (ref 80.0–100.0)
Platelets: 241 10*3/uL (ref 150–400)
RBC: 3.16 MIL/uL — ABNORMAL LOW (ref 4.22–5.81)
RDW: 14.4 % (ref 11.5–15.5)
WBC: 3.2 10*3/uL — ABNORMAL LOW (ref 4.0–10.5)
nRBC: 0 % (ref 0.0–0.2)

## 2022-11-11 LAB — COMPREHENSIVE METABOLIC PANEL
ALT: 14 U/L (ref 0–44)
AST: 18 U/L (ref 15–41)
Albumin: 2.8 g/dL — ABNORMAL LOW (ref 3.5–5.0)
Alkaline Phosphatase: 86 U/L (ref 38–126)
Anion gap: 11 (ref 5–15)
BUN: 60 mg/dL — ABNORMAL HIGH (ref 8–23)
CO2: 26 mmol/L (ref 22–32)
Calcium: 8.1 mg/dL — ABNORMAL LOW (ref 8.9–10.3)
Chloride: 96 mmol/L — ABNORMAL LOW (ref 98–111)
Creatinine, Ser: 4.83 mg/dL — ABNORMAL HIGH (ref 0.61–1.24)
GFR, Estimated: 13 mL/min — ABNORMAL LOW (ref >60)
Glucose, Bld: 148 mg/dL — ABNORMAL HIGH (ref 70–99)
Potassium: 4 mmol/L (ref 3.5–5.1)
Sodium: 133 mmol/L — ABNORMAL LOW (ref 135–145)
Total Bilirubin: 0.8 mg/dL (ref <1.2)
Total Protein: 6.4 g/dL — ABNORMAL LOW (ref 6.5–8.1)

## 2022-11-11 LAB — C DIFFICILE QUICK SCREEN W PCR REFLEX
C Diff antigen: POSITIVE — AB
C Diff toxin: NEGATIVE

## 2022-11-11 LAB — CLOSTRIDIUM DIFFICILE BY PCR, REFLEXED: Toxigenic C. Difficile by PCR: POSITIVE — AB

## 2022-11-11 LAB — PATHOLOGIST SMEAR REVIEW

## 2022-11-11 MED ORDER — INSULIN ASPART 100 UNIT/ML IJ SOLN: 0.0000 [IU] | Freq: Three times a day (TID) | INTRAMUSCULAR | Status: DC

## 2022-11-11 MED ORDER — INSULIN GLARGINE-YFGN 100 UNIT/ML ~~LOC~~ SOLN
3.0000 [IU] | Freq: Every day | SUBCUTANEOUS | Status: DC
  Administered 2022-11-11 – 2022-11-14 (×4): 3 [IU] via SUBCUTANEOUS
  Filled 2022-11-11 (×4): qty 0.03

## 2022-11-11 MED ORDER — NEPRO/CARBSTEADY PO LIQD
237.0000 mL | Freq: Three times a day (TID) | ORAL | Status: DC
  Administered 2022-11-11 – 2022-11-16 (×13): 237 mL via ORAL

## 2022-11-11 MED ORDER — VANCOMYCIN HCL 125 MG PO CAPS
125.0000 mg | ORAL_CAPSULE | Freq: Four times a day (QID) | ORAL | Status: DC
  Administered 2022-11-11 – 2022-11-14 (×10): 125 mg via ORAL
  Filled 2022-11-11 (×14): qty 1

## 2022-11-11 MED ORDER — INSULIN ASPART 100 UNIT/ML IJ SOLN
1.0000 [IU] | Freq: Three times a day (TID) | INTRAMUSCULAR | Status: DC
  Administered 2022-11-11 – 2022-11-12 (×2): 1 [IU] via SUBCUTANEOUS
  Filled 2022-11-11 (×2): qty 1

## 2022-11-11 MED ORDER — INSULIN ASPART 100 UNIT/ML IJ SOLN
0.0000 [IU] | Freq: Every day | INTRAMUSCULAR | Status: DC
  Administered 2022-11-14: 2 [IU] via SUBCUTANEOUS
  Administered 2022-11-15: 3 [IU] via SUBCUTANEOUS
  Filled 2022-11-11 (×2): qty 1

## 2022-11-11 MED ORDER — RENA-VITE PO TABS
1.0000 | ORAL_TABLET | Freq: Every day | ORAL | Status: DC
  Administered 2022-11-11 – 2022-11-14 (×4): 1
  Filled 2022-11-11 (×5): qty 1

## 2022-11-11 MED ORDER — INSULIN ASPART 100 UNIT/ML IJ SOLN
0.0000 [IU] | Freq: Three times a day (TID) | INTRAMUSCULAR | Status: DC
Start: 2022-11-11 — End: 2022-11-16
  Administered 2022-11-11: 3 [IU] via SUBCUTANEOUS
  Administered 2022-11-12: 2 [IU] via SUBCUTANEOUS
  Administered 2022-11-12: 1 [IU] via SUBCUTANEOUS
  Administered 2022-11-13: 2 [IU] via SUBCUTANEOUS
  Administered 2022-11-13: 1 [IU] via SUBCUTANEOUS
  Administered 2022-11-13: 2 [IU] via SUBCUTANEOUS
  Administered 2022-11-14: 4 [IU] via SUBCUTANEOUS
  Administered 2022-11-14: 5 [IU] via SUBCUTANEOUS
  Administered 2022-11-14: 2 [IU] via SUBCUTANEOUS
  Administered 2022-11-15 (×2): 5 [IU] via SUBCUTANEOUS
  Administered 2022-11-16: 2 [IU] via SUBCUTANEOUS
  Administered 2022-11-16: 1 [IU] via SUBCUTANEOUS
  Filled 2022-11-11 (×13): qty 1

## 2022-11-11 NOTE — Progress Notes (Signed)
PROGRESS NOTE    Shawn [name]  WUJ:[id number] DOB: 11/30/1955 DOA: 11/10/2022 [facility]: [facility]  [id number]  LOS: 1 day   Brief hospital course:   Assessment & Plan: Shawn [name] is a 67 y.o. male with medical history significant of HFrEF, ESRD on hemodialysis Monday Wednesday Friday, substance abuse, history of CVA presenting with hypoglycemia, hypothermia, ascites, diarrhea.  Limited history in setting of generalized fatigue.  Per report, patient with worsening generalized malaise.  Patient was concerned about hypoglycemia.  Patient reports taking his insulin yesterday.  Patient unclear if he increase the dose.  Mild decreased p.o. intake.  No chest pain or shortness of breath.  Positive abdominal distention.    * Hypervolemia ESRD on dialysis [facility]) on MWF Noted baseline ESRD, HFrEF with general volume overload on presentation 1-2+ pitting edema bilaterally Moderate to large volume ascites on CT scan --s/p paracentesis on 11/6 with 2L removed Plan: --iHD per nephro  Hypoglycemia Blood sugar in the 60s on presentation Noted baseline noncompliance as well as admission for DKA October 2024 Some concern for labile blood sugar versus brittle diabetes.  Pt also seemed not clear about insulin dose --resolved with D50.  Hypothermia T 95.1 on presentation.    Diarrhea 2/2 C diff infection Patient reports multiple rounds of loose diarrhea over multiple days CT abdomen pelvis with no overt signs of colitis at present White count within normal limits Minimal to mild abdominal pain on presentation --C diff antigen pos, toxin neg, PCR pos Plan: --d/c Rocephin and Flagyl --start oral vanc   Essential hypertension BP stable and wnl without antihypertensives   Iron deficiency anemia Hgb 9.6  At baseline   Smoker 1/2 pack/day smoker Discussed cessation at length by admitting physician Nicotine patch  Alcohol abuse Patient denies any active alcohol  use  HLD (hyperlipidemia) Statin  HFrEF (heart failure with reduced ejection fraction) ([name]) 2D echo June 2023 with a EF of 20 to 25% and grade 2 diastolic dysfunction Positive volume overload in setting of baseline ESRD-dialysis dependent volume status Pending hemodialysis Will plan for repeat 2D echo Trop pending  Ascites also confounding issue Cardiology consult as clinically indicated  Hyperkalemia K5.6 on presentation.   Status post Lokelma in the ER --correct with HD  DM --Recent A1c 11, poorly controlled -Change Novolog 0-6 units to tid, 0-5 units hs -Add Semglee 3 units daily -Add Novolog 1 unit meal coverage tid    DVT prophylaxis: Heparin SQ Code Status: Full code  Family Communication:  Level of care: Progressive Dispo:   The patient is from: home Anticipated d/c is to: to be determined Anticipated d/c date is: 2-3 days   Subjective and Interval History:  During rounds, pt was found sleeping in a puddle of diarrhea.   Objective: Vitals:   11/11/22 0345 11/11/22 0815 11/11/22 1158 11/11/22 1607  BP: 116/76 126/74 129/85 128/83  Pulse: 65 66 86 68  Resp: 18     Temp: (!) 97.5 F (36.4 C) (!) 97.4 F (36.3 C) 98.5 F (36.9 C) 99 F (37.2 C)  TempSrc: Axillary     SpO2: 100% 98% 95% 96%  Weight:        Intake/Output Summary (Last 24 hours) at 11/11/2022 1847 Last data filed at 11/11/2022 0400 Gross per 24 hour  Intake 240 ml  Output 2500 ml  Net -2260 ml   Filed Weights   11/10/22 0307  Weight: 65.3 kg    Examination:   Constitutional: NAD CV: No  cyanosis.   RESP: normal respiratory effort, on RA   Data Reviewed: I have personally reviewed labs and imaging studies  Time spent: 50 minutes  Darlin Priestly, MD [facility] If 7PM-7AM, please contact night-coverage 11/11/2022, 6:47 PM

## 2022-11-11 NOTE — Evaluation (Signed)
Physical Therapy Evaluation Patient Details Name: Shawn [name] MRN: [id number] DOB: 02-Oct-1955 Today's Date: 11/11/2022  History of Present Illness  Pt is a 67 y.o. male with medical history significant of HFrEF, ESRD on hemodialysis Monday Wednesday Friday, substance abuse, history of CVA presenting to ED on 11/10/22 with hypoglycemia, hypothermia, ascites, diarrhea.  Limited history in setting of generalized fatigue.  Per report, patient with worsening generalized malaise. Patient was concerned about hypoglycemia.  Clinical Impression  Pt is received in bed, he is agreeable to PT session. At baseline, Pt reports requiring assist from family (sisters) for ADLs /grooming, able to stand/step pivot for transfers into w/c, and use of electric scooter when at grocery stores. Pt performs bed mobility minA and lat scoots at EOB modA; unable to assess further mobility due to Pt refusing standing activities at this time. Pt requires occasional cuing for hand placement during bed mobility and lat scoots. Pt would benefit from skilled PT to address above deficits and promote optimal return to PLOF.      If plan is discharge home, recommend the following: A little help with walking and/or transfers;A little help with bathing/dressing/bathroom;Assistance with cooking/housework;Direct supervision/assist for medications management;Assist for transportation   Can travel by private vehicle   No    Equipment Recommendations None recommended by PT  Recommendations for Other Services       Functional Status Assessment Patient has had a recent decline in their functional status and demonstrates the ability to make significant improvements in function in a reasonable and predictable amount of time.     Precautions / Restrictions Precautions Precautions: Fall Restrictions Weight Bearing Restrictions: No      Mobility  Bed Mobility Overal bed mobility: Needs Assistance Bed Mobility: Supine to Sit,  Sit to Supine     Supine to sit: Min assist, HOB elevated, Used rails Sit to supine: Min assist, HOB elevated, Used rails   General bed mobility comments: Pt able to perform bed mobility minA for trunk control and intermittent cuing for sequencing    Transfers Overall transfer level: Needs assistance Equipment used: None Transfers: Bed to chair/wheelchair/BSC            Lateral/Scoot Transfers: Mod assist General transfer comment: Pt able to perform lat scoots at EOB with use of BUE/BLE to assist; Pt refusing OOB mobility due to reports of hunger    Ambulation/Gait               General Gait Details: Deferred at this time due to desire to eat  Stairs            Wheelchair Mobility     Tilt Bed    Modified Rankin (Stroke Patients Only)       Balance Overall balance assessment: Needs assistance Sitting-balance support: Bilateral upper extremity supported, Feet supported Sitting balance-Leahy Scale: Fair Sitting balance - Comments: Pt able to maintain seated balance at EOB for brief period with no UE support but reliant on BUE support for extended time       Standing balance comment: NT at this time due to Pt not wanting to perform OOB activities                             Pertinent Vitals/Pain Pain Assessment Pain Assessment: No/denies pain    Home Living Family/patient expects to be discharged to:: Private residence Living Arrangements: Other relatives (sisters) Available Help at Discharge: Family;Available 24 hours/day Type of  Home: House Home Access: Ramped entrance       Home Layout: One level Home Equipment: Scientist, research (medical) (4 wheels);Cane - single Information systems manager (2 wheels) Additional Comments: Lives with sister with 24/7 supervision between sister; Pt reports not having an aide anymore    Prior Function Prior Level of Function : Needs assist             Mobility Comments: per prior notes  uses a WC, stands only for transfers to/from bed, WC, BSC ADLs Comments: Sister assist with ADLs. Pt reports he uses a BSC for toileting and sister assists him with sponge bathing, as he is unable to access the bathroom in their house.     Extremity/Trunk Assessment   Upper Extremity Assessment Upper Extremity Assessment: Generalized weakness    Lower Extremity Assessment Lower Extremity Assessment: Generalized weakness       Communication   Communication Communication: No apparent difficulties Cueing Techniques: Verbal cues;Tactile cues  Cognition Arousal: Alert Behavior During Therapy: WFL for tasks assessed/performed Overall Cognitive Status: Within Functional Limits for tasks assessed                                 General Comments: AO x4; Pleasant and cooperative throughout PT session        General Comments General comments (skin integrity, edema, etc.): Pt hungry, not wanting to do a lot, able to get to/maintain sitting but refused standing activities    Exercises     Assessment/Plan    PT Assessment Patient needs continued PT services  PT Problem List Decreased strength;Decreased range of motion;Decreased activity tolerance;Decreased balance;Decreased mobility;Decreased knowledge of use of DME;Decreased safety awareness;Pain       PT Treatment Interventions DME instruction;Gait training;Stair training;Functional mobility training;Therapeutic activities;Therapeutic exercise;Balance training;Patient/family education;Wheelchair mobility training;Neuromuscular re-education    PT Goals (Current goals can be found in the Care Plan section)  Acute Rehab PT Goals Patient Stated Goal: to go home PT Goal Formulation: With patient Time For Goal Achievement: 11/25/22 Potential to Achieve Goals: Fair    Frequency Min 1X/week     Co-evaluation               AM-PAC PT "6 Clicks" Mobility  Outcome Measure Help needed turning from your back to  your side while in a flat bed without using bedrails?: A Little Help needed moving from lying on your back to sitting on the side of a flat bed without using bedrails?: A Little Help needed moving to and from a bed to a chair (including a wheelchair)?: A Little Help needed standing up from a chair using your arms (e.g., wheelchair or bedside chair)?: A Little Help needed to walk in hospital room?: A Lot Help needed climbing 3-5 steps with a railing? : Total 6 Click Score: 15    End of Session   Activity Tolerance: Patient limited by fatigue (Self-limiting due to being hungry) Patient left: in bed;with call bell/phone within reach;with bed alarm set Nurse Communication: Mobility status PT Visit Diagnosis: Muscle weakness (generalized) (M62.81);Difficulty in walking, not elsewhere classified (R26.2)    Time: 0981-1914 PT Time Calculation (min) (ACUTE ONLY): 12 min   Charges:                 Elmon Else, SPT   Samarah Hogle 11/11/2022, 11:55 AM

## 2022-11-11 NOTE — Progress Notes (Signed)
Received patient at bedside; room  257.  Alert and oriented.  Informed consent signed and in chart.   TX duration: 3:30  Patient tolerated well.  Patient condition stable upon this treatment discharge. Alert, without acute distress.  Hand-off given to patient's nurse.   Access used: RIJ TDC Access issues: None (Lines reversed this treatment d/t impact from position repositioning)  Total UF removed: 2500 mL Medication(s) given: None Post HD VS: please see data insert    11/11/22 0345  Vitals  Temp (!) 97.5 F (36.4 C)  Temp Source Axillary  BP 116/76  MAP (mmHg) 90  BP Location Left Arm  BP Method Automatic  Patient Position (if appropriate) Lying  Pulse Rate 65  Pulse Rate Source Monitor  Resp 18  Oxygen Therapy  SpO2 100 %  O2 Device Room Air  Patient Activity (if Appropriate) In bed  Pulse Oximetry Type Continuous  During Treatment Monitoring  Cumulative Fluid Removed (mL) per Treatment  2500.14  Post Treatment  Dialyzer Clearance Lightly streaked  Hemodialysis Intake (mL) 0 mL  Liters Processed 83  Fluid Removed (mL) 2500 mL  Tolerated HD Treatment Yes  Post-Hemodialysis Comments Treatment completed and blood returned without issue.  Note  Patient Observations Patient awakened from sleep, no c/o voiced, no acute distress noted; patient condition stable upon this treatment d/c.  Hemodialysis Catheter Right Internal jugular Double lumen Permanent (Tunneled)  No placement date or time found.   Placed prior to admission: Yes  Orientation: Right  Access Location: Internal jugular  Hemodialysis Catheter Type: Double lumen Permanent (Tunneled)  Site Condition No complications  Blue Lumen Status Flushed;Heparin locked;Dead end cap in place  Red Lumen Status Flushed;Heparin locked;Dead end cap in place  Purple Lumen Status N/A  Catheter fill solution Heparin 1000 units/ml  Catheter fill volume (Arterial) 1.8 cc  Catheter fill volume (Venous) 1.8  Dressing Type  Transparent  Dressing Status Antimicrobial disc in place;Clean, Dry, Intact  Drainage Description None  Post treatment catheter status Capped and Clamped      [facility]

## 2022-11-11 NOTE — Progress Notes (Signed)
[facility]  ROUNDING NOTE   Subjective:   Shawn [name] is a 67 y.o. male with past medical conditions including end stage renal disease on hemodialysis, hypertension, diabetes mellitus type II, congestive heart failure, coronary artery disease, CVA, alcohol abuse, tobacco abuse and hypothyroidism. He presents to the ED with hypoglycemia. Patient has been admitted for Hypervolemia [E87.70] Hyperkalemia [E87.5] Hypoglycemia [E16.2] Other ascites [R18.8] Bilateral pleural effusion [J90] Nausea and vomiting in adult [R11.2]  Patient is known to our practice and receives outpatient dialysis treatments at [facility] on a MWF schedule, supervised by Dr Thedore Mins.  Last reported dialysis treatment on Monday.  Patient presented to the emergency room for complaints of low blood sugar.  He underwent hemodialysis yesterday and 2500 cc of fluid was removed. He feels like his swelling is new and not as bad. He is able to eat without nausea or vomiting.   Objective:  Vital signs in last 24 hours:  Temp:  [97.4 F (36.3 C)-98.5 F (36.9 C)] 98.5 F (36.9 C) (11/07 1158) Pulse Rate:  [57-86] 86 (11/07 1158) Resp:  [16-18] 18 (11/07 0345) BP: (90-133)/(53-87) 129/85 (11/07 1158) SpO2:  [95 %-100 %] 95 % (11/07 1158)  Weight change:  Filed Weights   11/10/22 0307  Weight: 65.3 kg    Intake/Output: I/O last 3 completed shifts: In: 340 [P.O.:240; IV Piggyback:100] Out: 2500 [Other:2500]   Intake/Output this shift:  No intake/output data recorded.  Physical Exam: General: NAD  Head: Normocephalic, atraumatic. Moist oral mucosal membranes  Eyes: Anicteric  Lungs:  Clear to auscultation, room air  Heart: Regular rate and rhythm  Abdomen:  Soft, nontender, mildly distended  Extremities: 1+ left leg peripheral edema.  Neurologic: Alert, able to answer questions appropriately  Skin: No lesions  Access: Right PermCath    Basic Metabolic Panel: Recent Labs  Lab  11/10/22 0325 11/11/22 0434  NA 130* 133*  K 5.6* 4.0  CL 97* 96*  CO2 22 26  GLUCOSE 136* 148*  BUN 108* 60*  CREATININE 8.01* 4.83*  CALCIUM 8.2* 8.1*    Liver Function Tests: Recent Labs  Lab 11/10/22 0325 11/11/22 0434  AST 29 18  ALT 19 14  ALKPHOS 98 86  BILITOT 0.8 0.8  PROT 6.5 6.4*  ALBUMIN 2.7* 2.8*   Recent Labs  Lab 11/10/22 0325  LIPASE 21   No results for input(s): "AMMONIA" in the last 168 hours.  CBC: Recent Labs  Lab 11/10/22 0325 11/11/22 0434  WBC 4.0 3.2*  NEUTROABS 2.9  --   HGB 9.6* 9.5*  HCT 29.6* 28.2*  MCV 94.0 89.2  PLT 275 241    Cardiac Enzymes: No results for input(s): "CKTOTAL", "CKMB", "CKMBINDEX", "TROPONINI" in the last 168 hours.  BNP: Invalid input(s): "POCBNP"  CBG: Recent Labs  Lab 11/10/22 1959 11/10/22 2354 11/11/22 0428 11/11/22 0817 11/11/22 1200  GLUCAP 136* 247* 131* 206* 414*    Microbiology: Results for orders placed or performed during the hospital encounter of 11/10/22  Body fluid culture w Gram Stain     Status: None (Preliminary result)   Collection Time: 11/10/22  4:24 PM   Specimen: PATH Cytology Peritoneal fluid  Result Value Ref Range Status   Specimen Description   Final    PERITONEAL Performed at [facility], [address] [place], [place] [address]    Special Requests   Final    PERITONEAL Performed at [facility], [address], [place], [place] [address]  Gram Stain   Final    WBC PRESENT, PREDOMINANTLY MONONUCLEAR NO ORGANISMS SEEN CYTOSPIN SMEAR    Culture   Final    NO GROWTH < 24 HOURS Performed at [facility], [address] [place], [place] [address]    Report Status PENDING  Incomplete  Gastrointestinal Panel by PCR , Stool     Status: None   Collection Time: 11/11/22  8:32 AM   Specimen: STOOL  Result Value Ref Range Status   Campylobacter species NOT DETECTED NOT DETECTED Final   Plesimonas shigelloides NOT DETECTED NOT  DETECTED Final   Salmonella species NOT DETECTED NOT DETECTED Final   Yersinia enterocolitica NOT DETECTED NOT DETECTED Final   Vibrio species NOT DETECTED NOT DETECTED Final   Vibrio cholerae NOT DETECTED NOT DETECTED Final   Enteroaggregative E coli (EAEC) NOT DETECTED NOT DETECTED Final   Enteropathogenic E coli (EPEC) NOT DETECTED NOT DETECTED Final   Enterotoxigenic E coli (ETEC) NOT DETECTED NOT DETECTED Final   Shiga like toxin producing E coli (STEC) NOT DETECTED NOT DETECTED Final   Shigella/Enteroinvasive E coli (EIEC) NOT DETECTED NOT DETECTED Final   Cryptosporidium NOT DETECTED NOT DETECTED Final   Cyclospora cayetanensis NOT DETECTED NOT DETECTED Final   Entamoeba histolytica NOT DETECTED NOT DETECTED Final   Giardia lamblia NOT DETECTED NOT DETECTED Final   Adenovirus F40/41 NOT DETECTED NOT DETECTED Final   Astrovirus NOT DETECTED NOT DETECTED Final   Norovirus GI/GII NOT DETECTED NOT DETECTED Final   Rotavirus A NOT DETECTED NOT DETECTED Final   Sapovirus (I, II, IV, and V) NOT DETECTED NOT DETECTED Final    Comment: Performed at [facility], [address] [place], [place] [address]  C Difficile Quick Screen w PCR reflex     Status: Abnormal   Collection Time: 11/11/22  8:35 AM   Specimen: STOOL  Result Value Ref Range Status   C Diff antigen POSITIVE (A) NEGATIVE Final   C Diff toxin NEGATIVE NEGATIVE Final   C Diff interpretation Results are indeterminate. See PCR results.  Final    Comment: Performed at [facility], [address] [place], [place] [address]    Coagulation Studies: No results for input(s): "LABPROT", "INR" in the last 72 hours.  Urinalysis: No results for input(s): "COLORURINE", "LABSPEC", "PHURINE", "GLUCOSEU", "HGBUR", "BILIRUBINUR", "KETONESUR", "PROTEINUR", "UROBILINOGEN", "NITRITE", "LEUKOCYTESUR" in the last 72 hours.  Invalid input(s): "APPERANCEUR"    Imaging: US Paracentesis  Result Date:  11/11/2022 INDICATION: History of CHF with fluid overload and ascites. Request for diagnostic and therapeutic paracentesis with 2 L maximum. Last US paracentesis with 1.5 L removed. EXAM: ULTRASOUND GUIDED DIAGNOSTIC and THERAPEUTIC PARACENTESIS MEDICATIONS: 10 cc 1% lidocaine COMPLICATIONS: None immediate. PROCEDURE: Informed written consent was obtained from the patient after a discussion of the risks, benefits and alternatives to treatment. A timeout was performed prior to the initiation of the procedure. Initial ultrasound scanning demonstrates a large amount of ascites within the right upper abdominal quadrant. The right upper abdomen was prepped and draped in the usual sterile fashion. 1% lidocaine was used for local anesthesia. Following this, a 19 gauge, 7-cm, Yueh catheter was introduced. An ultrasound image was saved for documentation purposes. The paracentesis was performed. The catheter was removed and a dressing was applied. The patient tolerated the procedure well without immediate post procedural complication. FINDINGS: A total of approximately 2 L of serous ascitic fluid was removed. Samples were sent to the laboratory as requested by the clinical  team. IMPRESSION: Successful ultrasound-guided diagnostic and therapeutic paracentesis yielding 2 liters of peritoneal fluid. Procedure performed by Mina Marble, PA-C Electronically Signed   By: Roanna Banning M.D.   On: 11/11/2022 07:00   CT ABDOMEN PELVIS WO CONTRAST  Result Date: 11/10/2022 CLINICAL DATA:  67 year old male with abdominal pain and hypoglycemia. History of ascites, paracentesis last month. EXAM: CT ABDOMEN AND PELVIS WITHOUT CONTRAST TECHNIQUE: Multidetector CT imaging of the abdomen and pelvis was performed following the standard protocol without IV contrast. RADIATION DOSE REDUCTION: This exam was performed according to the departmental dose-optimization program which includes automated exposure control, adjustment of the mA and/or  kV according to patient size and/or use of iterative reconstruction technique. COMPARISON:  Noncontrast CT Abdomen and Pelvis 08/28/2022. FINDINGS: Lower chest: Increased layering bilateral pleural effusions since August with simple fluid density. Moderate volume pleural fluid now with compressive lung base atelectasis. Underlying cardiomegaly not significantly changed. Dual lumen dialysis type vascular catheter visible at the cavoatrial junction. No pericardial effusion. Hepatobiliary: Moderate to large volume ascites with fairly simple fluid density. Fluid volume increased compared to 08/28/2022. Underlying volume of the noncontrast liver seems diminished, although the liver contour is not obviously nodular. Unremarkable gallbladder. Pancreas: Limited detail, no abnormality evident. Spleen: Diminutive and unremarkable series 2, image 28. Adrenals/Urinary Tract: Limited detail. Noncontrast adrenal glands, kidneys appear symmetric and unremarkable. Small volume of fluid within the urinary bladder which is mostly displaced toward the anterior lower abdominal wall. See sagittal image 55. Stomach/Bowel: Fluid-filled stomach, but no other dilated bowel loops in the abdomen or pelvis. Limited bowel detail due to abundant ascites and lack of contrast. However, there is evidence of an air-filled retrocecal appendix located along the right gutter series 2, image 53. No pneumoperitoneum. Vascular/Lymphatic: Severe Aortoiliac calcified atherosclerosis. Normal caliber abdominal aorta. Vascular patency is not evaluated in the absence of IV contrast. No lymphadenopathy is evident. Reproductive: Ascites tracking in both inguinal canals. Other: Moderate to large volume ascites in the pelvis. Simple fluid density. Musculoskeletal: Stable.  No acute osseous abnormality identified. IMPRESSION: 1. Moderate to large volume Ascites, increased compared to August CT. Also increased and now moderate bilateral layering pleural effusions  with lung base atelectasis. 2. Fluid distended stomach, but other bowel loops are decompressed throughout the abdomen and pelvis. NG tube decompression might be valuable. 3. Cardiomegaly.  Advanced Aortic Atherosclerosis (ICD10-I70.0). 4. No other acute or inflammatory process evident on this noncontrast exam. Electronically Signed   By: Odessa Fleming M.D.   On: 11/10/2022 04:31     Medications:       Chlorhexidine Gluconate Cloth  6 each Topical Q0600   feeding supplement (NEPRO CARB STEADY)  237 mL Oral BID BM   heparin  5,000 Units Subcutaneous Q8H   insulin aspart  0-5 Units Subcutaneous QHS   insulin aspart  0-6 Units Subcutaneous TID WC   insulin aspart  1 Units Subcutaneous TID WC   insulin glargine-yfgn  3 Units Subcutaneous Daily   sodium chloride flush  3 mL Intravenous Q12H   acetaminophen **OR** acetaminophen, alteplase, heparin, ondansetron **OR** ondansetron (ZOFRAN) IV, sodium chloride flush  Assessment/ Plan:  Shawn [name] is a 67 y.o.  male with end stage renal disease on hemodialysis, hypertension, diabetes mellitus type II, congestive heart failure, coronary artery disease, CVA, alcohol abuse, tobacco abuse and hypothyroidism   Outpatient dialysis-[facility]/195 minutes / 65 kg/right IJ PermCath/MWF  End-stage renal disease with hyperkalemia on hemodialysis.  Patient underwent hemodialysis on Wednesday.  Will maintain MWF schedule.  2. Fluid volume overload, Moderate to large ascites seen on CT abd and pelvis.  2 L fluid removed with paracentesis on 11/10/2022.  2.5 L removed with dialysis.  3.  Hypoglycemia with diabetes mellitus type II with chronic kidney disease with diabetic ketoacidosis: Insulin-dependent with Lantus.  Glucose in the 30s on EMS arrival.  Was treated with dextrose.  4. Anemia of chronic kidney disease Lab Results  Component Value Date   HGB 9.5 (L) 11/11/2022    Patient receives Mircera at outpatient clinic. Will monitor for  now.  5.  Hypertension with chronic kidney disease.  Home regimen includes carvedilol and furosemide.  Now currently held    LOS: 1 Johnathen Testa 11/7/20243:24 PM

## 2022-11-11 NOTE — Progress Notes (Signed)
Initial Nutrition Assessment  DOCUMENTATION CODES:   Severe malnutrition in context of chronic illness  INTERVENTION:   Nepro Shake po TID, each supplement provides 425 kcal and 19 grams protein  Magic cup TID with meals, each supplement provides 290 kcal and 9 grams of protein  Rena-vit po daily   Liberalize diet   Pt at high refeed risk; recommend monitor potassium, magnesium and phosphorus labs daily until stable  Daily weights  NUTRITION DIAGNOSIS:   Severe Malnutrition related to chronic illness as evidenced by severe fat depletion, severe muscle depletion.  GOAL:   Patient will meet greater than or equal to 90% of their needs  MONITOR:   PO intake, Supplement acceptance, Labs, Weight trends, I & O's, Skin  REASON FOR ASSESSMENT:   Malnutrition Screening Tool    ASSESSMENT:   67 y/o male with past medical history of DM2 with multiple episodes of hyperglycemic crisis, chronic systolic HF, polysubstance use disorder, etoh abuse, HTN, NICM, ESRD on HD, BPH, hypothyroidism, HLD, stroke, HHS and recent admission for DKA who is admitted with diarrhea, ascites, hypoglycemia and hyperglycemia.  -Pt s/p paracentesis 11/6 with 2.0L output   Visited pt's room today. Pt is well known to this RD from numerous previous admissions. Pt with poor appetite and oral intake at baseline; pt does drink chocolate Ensure at home. Pt reports nausea and vomiting for 1 day pta. Pt continues to have poor oral intake in hospital; pt eating mainly graham crackers and peanut butter per RN. Pt did drink his Nepro supplements today; RD will increase to three times daily. RD will also liberalize pt's diet as pt is not eating enough to exceed any nutrient limits. Pt is at high refeed risk. Per chart, pt appears fairly weight stable at baseline.    Medications reviewed and include: heparin, insulin  Labs reviewed: Na 133(L), K 4.0 wnl, BUN 60(H), creat 4.83(H) Wbc- 3.2(L), Hgb 9.5(L), Hct  28.2(L) Cbgs- 414, 206, 131 x 24 hrs  AIC 11.0(H)- 8/25  NUTRITION - FOCUSED PHYSICAL EXAM:  Flowsheet Row Most Recent Value  Orbital Region Severe depletion  Upper Arm Region Severe depletion  Thoracic and Lumbar Region Severe depletion  Buccal Region Severe depletion  Temple Region Severe depletion  Clavicle Bone Region Severe depletion  Clavicle and Acromion Bone Region Severe depletion  Scapular Bone Region Severe depletion  Dorsal Hand Severe depletion  Patellar Region Severe depletion  Anterior Thigh Region Severe depletion  Posterior Calf Region Severe depletion  Edema (RD Assessment) Moderate  Hair Reviewed  Eyes Reviewed  Mouth Reviewed  Skin Reviewed  Nails Reviewed   Diet Order:   Diet Order             Diet renal with fluid restriction Fluid restriction: 1200 mL Fluid; Room service appropriate? Yes; Fluid consistency: Thin  Diet effective now                  EDUCATION NEEDS:   Education needs have been addressed  Skin:  Skin Assessment: Reviewed RN Assessment  Last BM:  11/7- type 7  Height:   Ht Readings from Last 1 Encounters:  10/27/22 5\' 11"  (1.803 m)    Weight:   Wt Readings from Last 1 Encounters:  11/10/22 65.3 kg    Ideal Body Weight:  78 kg  BMI:  Body mass index is 20.08 kg/m.  Estimated Nutritional Needs:   Kcal:  1900-2200kcal/day  Protein:  95-110g/day  Fluid:  UOP +1L  Betsey Holiday MS, RD,  LDN Please refer to [facility] for RD and/or RD on-call/weekend/after hours pager

## 2022-11-11 NOTE — Inpatient Diabetes Management (Signed)
Inpatient Diabetes Program Recommendations  AACE/ADA: New Consensus Statement on Inpatient Glycemic Control (2015)  Target Ranges:  Prepandial:   less than 140 mg/dL      Peak postprandial:   less than 180 mg/dL (1-2 hours)      Critically ill patients:  140 - 180 mg/dL   Lab Results  Component Value Date   GLUCAP 414 (H) 11/11/2022   HGBA1C 11.0 (H) 08/29/2022    Latest Reference Range & Units 11/10/22 06:40 11/10/22 07:47 11/10/22 17:52 11/10/22 18:23 11/10/22 19:59 11/10/22 23:54 11/11/22 04:28 11/11/22 08:17 11/11/22 12:00  Glucose-Capillary 70 - 99 mg/dL 62 (L) 161 (H) 68 (L) 73 136 (H) 247 (H) Novolog 2 units 131 (H) 206 (H) Novolog 2 units 414 (H) Novolog 6 units  (L): Data is abnormally low (H): Data is abnormally high  Review of Glycemic Control  Diabetes history: DM Outpatient Diabetes medications: Semglee 5 units daily, Novolog 3 units tid meal coverage Current orders for Inpatient glycemic control: Novolog 0-6 units q 4 hrs.  Inpatient Diabetes Program Recommendations:   Please consider -Change Novolog 0-6 units to tid, 0-5 units hs -Add Semglee 3 units daily -Add Novolog 1 unit meal coverage tid   Thank you, Shawn [name]. Hillman Attig, RN, MSN, CDCES  Diabetes Coordinator Inpatient Glycemic Control Team Team Pager [phone] (8am-5pm) 11/11/2022 1:49 PM

## 2022-11-11 NOTE — Plan of Care (Signed)

## 2022-11-12 ENCOUNTER — Other Ambulatory Visit (HOSPITAL_COMMUNITY): Payer: Self-pay

## 2022-11-12 DIAGNOSIS — E8770 Fluid overload, unspecified: Secondary | ICD-10-CM | POA: Diagnosis not present

## 2022-11-12 LAB — BASIC METABOLIC PANEL
Anion gap: 12 (ref 5–15)
BUN: 79 mg/dL — ABNORMAL HIGH (ref 8–23)
CO2: 24 mmol/L (ref 22–32)
Calcium: 8.2 mg/dL — ABNORMAL LOW (ref 8.9–10.3)
Chloride: 97 mmol/L — ABNORMAL LOW (ref 98–111)
Creatinine, Ser: 5.84 mg/dL — ABNORMAL HIGH (ref 0.61–1.24)
GFR, Estimated: 10 mL/min — ABNORMAL LOW (ref >60)
Glucose, Bld: 154 mg/dL — ABNORMAL HIGH (ref 70–99)
Potassium: 4.6 mmol/L (ref 3.5–5.1)
Sodium: 133 mmol/L — ABNORMAL LOW (ref 135–145)

## 2022-11-12 LAB — CBC
HCT: 29.3 % — ABNORMAL LOW (ref 39.0–52.0)
Hemoglobin: 9.7 g/dL — ABNORMAL LOW (ref 13.0–17.0)
MCH: 30.2 pg (ref 26.0–34.0)
MCHC: 33.1 g/dL (ref 30.0–36.0)
MCV: 91.3 fL (ref 80.0–100.0)
Platelets: 255 10*3/uL (ref 150–400)
RBC: 3.21 MIL/uL — ABNORMAL LOW (ref 4.22–5.81)
RDW: 14.3 % (ref 11.5–15.5)
WBC: 3.3 10*3/uL — ABNORMAL LOW (ref 4.0–10.5)
nRBC: 0 % (ref 0.0–0.2)

## 2022-11-12 LAB — GLUCOSE, CAPILLARY
Glucose-Capillary: 139 mg/dL — ABNORMAL HIGH (ref 70–99)
Glucose-Capillary: 160 mg/dL — ABNORMAL HIGH (ref 70–99)
Glucose-Capillary: 175 mg/dL — ABNORMAL HIGH (ref 70–99)
Glucose-Capillary: 221 mg/dL — ABNORMAL HIGH (ref 70–99)

## 2022-11-12 LAB — PHOSPHORUS: Phosphorus: 4.8 mg/dL — ABNORMAL HIGH (ref 2.5–4.6)

## 2022-11-12 LAB — MAGNESIUM: Magnesium: 1.7 mg/dL (ref 1.7–2.4)

## 2022-11-12 MED ORDER — INSULIN ASPART 100 UNIT/ML IJ SOLN
2.0000 [IU] | Freq: Three times a day (TID) | INTRAMUSCULAR | Status: DC
  Administered 2022-11-12 – 2022-11-15 (×9): 2 [IU] via SUBCUTANEOUS
  Filled 2022-11-12 (×9): qty 1

## 2022-11-12 NOTE — TOC Benefit Eligibility Note (Signed)
Patient Product/process development scientist completed.    The patient is insured through [facility]. Patient has Medicare and is not eligible for a copay card, but may be able to apply for patient assistance, if available.    Ran test claim for Vancomycin 125 mg and the current 10 day co-pay is $0.00.  Ran test claim for Dificid 200 mg and the current 10 day co-pay is $0.00.  This test claim was processed through [facility]- copay amounts may vary at other pharmacies due to pharmacy/plan contracts, or as the patient moves through the different stages of their insurance plan.     Roland Earl, CPHT Pharmacy Technician III Certified Patient Advocate [facility] Patient Advocate Team Direct Number: [phone]  Fax: [phone]

## 2022-11-12 NOTE — Plan of Care (Signed)
  Problem: Education: Goal: Ability to describe self-care measures that may prevent or decrease complications (Diabetes Survival Skills Education) will improve Outcome: Progressing Goal: Individualized Educational Video(s) Outcome: Progressing   

## 2022-11-12 NOTE — Progress Notes (Signed)
Transition of Care [facility]) - Progression Note    Patient Details  Name: Shawn [name] MRN: [id number] Date of Birth: 1955-01-24  Transition of Care [facility]) CM/SW Contact  Truddie Hidden, RN Phone Number: 11/12/2022, 1:16 PM  Clinical Narrative:    Transition of Care [facility]) - Inpatient Brief Assessment   Patient Details  Name: Shawn [name] MRN: [id number] Date of Birth: 10/17/1955  Transition of Care [facility]) CM/SW Contact:    Truddie Hidden, RN Phone Number: 11/12/2022, 1:17 PM   Clinical Narrative: TOC continuing to follow patient's progress throughout discharge planning.   Transition of Care Asessment: Insurance and Status: Insurance coverage has been reviewed Patient has primary care physician: Yes Home environment has been reviewed: home Prior level of function:: independent Prior/Current Home Services: No current home services Social Determinants of Health Reivew: SDOH reviewed no interventions necessary Readmission risk has been reviewed: Yes Transition of care needs: no transition of care needs at this time         Expected Discharge Plan and Services                                               Social Determinants of Health (SDOH) Interventions SDOH Screenings   Food Insecurity: No Food Insecurity (11/10/2022)  Housing: Low Risk  (11/10/2022)  Transportation Needs: Unmet Transportation Needs (11/10/2022)  Utilities: Not At Risk (11/10/2022)  Financial Resource Strain: High Risk (08/24/2019)   Received from [facility], [facility]  Social Connections: Moderately Isolated (08/24/2019)   [facility], [facility]  Stress: No Stress Concern Present (08/24/2019)   Received from [facility], [facility]  Tobacco Use: High Risk (11/10/2022)    Readmission Risk Interventions    09/04/2022   11:28 AM 06/24/2021    2:57 PM  01/08/2021    1:22 PM  Readmission Risk Prevention Plan  Transportation Screening Complete Complete Complete  PCP or Specialist Appt within 3-5 Days Complete    HRI or Home Care Consult Complete    Social Work Consult for Recovery Care Planning/Counseling Complete  Complete  Palliative Care Screening Not Applicable  Not Applicable  Medication Review Oceanographer) Complete Complete Complete  Palliative Care Screening  Not Applicable   Skilled Nursing Facility  Not Applicable

## 2022-11-12 NOTE — Progress Notes (Signed)
[facility]  ROUNDING NOTE   Subjective:   Shawn [name] is a 67 y.o. male with past medical conditions including end stage renal disease on hemodialysis, hypertension, diabetes mellitus type II, congestive heart failure, coronary artery disease, CVA, alcohol abuse, tobacco abuse and hypothyroidism. He presents to the ED with hypoglycemia. Patient has been admitted for Hypervolemia [E87.70] Hyperkalemia [E87.5] Hypoglycemia [E16.2] Other ascites [R18.8] Bilateral pleural effusion [J90] Nausea and vomiting in adult [R11.2]  Patient is known to our practice and receives outpatient dialysis treatments at [facility] on a MWF schedule, supervised by Dr Thedore Mins.    Patient seen and evaluated during dialysis   HEMODIALYSIS FLOWSHEET:  Blood Flow Rate (mL/min): 0 mL/min Arterial Pressure (mmHg): 36.77 mmHg Venous Pressure (mmHg): -12.73 mmHg TMP (mmHg): 11.11 mmHg Ultrafiltration Rate (mL/min): 913 mL/min Dialysate Flow Rate (mL/min): 300 ml/min Dialysis Fluid Bolus: Normal Saline  Resting quietly on dialysis   Objective:  Vital signs in last 24 hours:  Temp:  [98.4 F (36.9 C)-99 F (37.2 C)] 98.6 F (37 C) (11/08 1216) Pulse Rate:  [48-73] 69 (11/08 1216) Resp:  [11-18] 15 (11/08 1216) BP: (91-128)/(66-83) 123/74 (11/08 1216) SpO2:  [96 %-100 %] 100 % (11/08 1216) Weight:  [123.4 kg-125.9 kg] 123.4 kg (11/08 1217)  Weight change:  Filed Weights   11/10/22 0307 11/12/22 0800 11/12/22 1217  Weight: 65.3 kg 125.9 kg 123.4 kg    Intake/Output: I/O last 3 completed shifts: In: 240 [P.O.:240] Out: 2500 [Other:2500]   Intake/Output this shift:  No intake/output data recorded.  Physical Exam: General: NAD  Head: Normocephalic, atraumatic. Moist oral mucosal membranes  Eyes: Anicteric  Lungs:  Clear to auscultation, room air  Heart: Regular rate and rhythm  Abdomen:  Soft, nontender, mildly distended  Extremities: 1+ left leg peripheral edema.   Neurologic: Alert, able to answer questions appropriately  Skin: No lesions  Access: Right PermCath    Basic Metabolic Panel: Recent Labs  Lab 11/10/22 0325 11/11/22 0434 11/12/22 0403  NA 130* 133* 133*  K 5.6* 4.0 4.6  CL 97* 96* 97*  CO2 22 26 24   GLUCOSE 136* 148* 154*  BUN 108* 60* 79*  CREATININE 8.01* 4.83* 5.84*  CALCIUM 8.2* 8.1* 8.2*  MG  --   --  1.7  PHOS  --   --  4.8*    Liver Function Tests: Recent Labs  Lab 11/10/22 0325 11/11/22 0434  AST 29 18  ALT 19 14  ALKPHOS 98 86  BILITOT 0.8 0.8  PROT 6.5 6.4*  ALBUMIN 2.7* 2.8*   Recent Labs  Lab 11/10/22 0325  LIPASE 21   No results for input(s): "AMMONIA" in the last 168 hours.  CBC: Recent Labs  Lab 11/10/22 0325 11/11/22 0434 11/12/22 0403  WBC 4.0 3.2* 3.3*  NEUTROABS 2.9  --   --   HGB 9.6* 9.5* 9.7*  HCT 29.6* 28.2* 29.3*  MCV 94.0 89.2 91.3  PLT 275 241 255    Cardiac Enzymes: No results for input(s): "CKTOTAL", "CKMB", "CKMBINDEX", "TROPONINI" in the last 168 hours.  BNP: Invalid input(s): "POCBNP"  CBG: Recent Labs  Lab 11/11/22 0428 11/11/22 0817 11/11/22 1200 11/11/22 1609 11/11/22 2138  GLUCAP 131* 206* 414* 273* 139*    Microbiology: Results for orders placed or performed during the hospital encounter of 11/10/22  Body fluid culture w Gram Stain     Status: None (Preliminary result)   Collection Time: 11/10/22  4:24 PM   Specimen: PATH Cytology Peritoneal  fluid  Result Value Ref Range Status   Specimen Description   Final    PERITONEAL Performed at [facility], [address] [place], [place] [address]    Special Requests   Final    PERITONEAL Performed at [facility], [address] [place], [place] [address]    Gram Stain   Final    WBC PRESENT, PREDOMINANTLY MONONUCLEAR NO ORGANISMS SEEN CYTOSPIN SMEAR    Culture   Final    NO GROWTH 2 DAYS Performed at [facility], [address], [place], [place] [address]     Report Status PENDING  Incomplete  Gastrointestinal Panel by PCR , Stool     Status: None   Collection Time: 11/11/22  8:32 AM   Specimen: STOOL  Result Value Ref Range Status   Campylobacter species NOT DETECTED NOT DETECTED Final   Plesimonas shigelloides NOT DETECTED NOT DETECTED Final   Salmonella species NOT DETECTED NOT DETECTED Final   Yersinia enterocolitica NOT DETECTED NOT DETECTED Final   Vibrio species NOT DETECTED NOT DETECTED Final   Vibrio cholerae NOT DETECTED NOT DETECTED Final   Enteroaggregative E coli (EAEC) NOT DETECTED NOT DETECTED Final   Enteropathogenic E coli (EPEC) NOT DETECTED NOT DETECTED Final   Enterotoxigenic E coli (ETEC) NOT DETECTED NOT DETECTED Final   Shiga like toxin producing E coli (STEC) NOT DETECTED NOT DETECTED Final   Shigella/Enteroinvasive E coli (EIEC) NOT DETECTED NOT DETECTED Final   Cryptosporidium NOT DETECTED NOT DETECTED Final   Cyclospora cayetanensis NOT DETECTED NOT DETECTED Final   Entamoeba histolytica NOT DETECTED NOT DETECTED Final   Giardia lamblia NOT DETECTED NOT DETECTED Final   Adenovirus F40/41 NOT DETECTED NOT DETECTED Final   Astrovirus NOT DETECTED NOT DETECTED Final   Norovirus GI/GII NOT DETECTED NOT DETECTED Final   Rotavirus A NOT DETECTED NOT DETECTED Final   Sapovirus (I, II, IV, and V) NOT DETECTED NOT DETECTED Final    Comment: Performed at [facility], [address] [place], [place] [address]  C Difficile Quick Screen w PCR reflex     Status: Abnormal   Collection Time: 11/11/22  8:35 AM   Specimen: STOOL  Result Value Ref Range Status   C Diff antigen POSITIVE (A) NEGATIVE Final   C Diff toxin NEGATIVE NEGATIVE Final   C Diff interpretation Results are indeterminate. See PCR results.  Final    Comment: Performed at [facility], [address] [place], [place] [address]  C. Diff by PCR, Reflexed     Status: Abnormal   Collection Time: 11/11/22  8:35 AM  Result Value Ref Range  Status   Toxigenic C. Difficile by PCR POSITIVE (A) NEGATIVE Final    Comment: Positive for toxigenic C. difficile with little to no toxin production. Only treat if clinical presentation suggests symptomatic illness. Performed at [facility], [address] [place], [place] [address]     Coagulation Studies: No results for input(s): "LABPROT", "INR" in the last 72 hours.  Urinalysis: No results for input(s): "COLORURINE", "LABSPEC", "PHURINE", "GLUCOSEU", "HGBUR", "BILIRUBINUR", "KETONESUR", "PROTEINUR", "UROBILINOGEN", "NITRITE", "LEUKOCYTESUR" in the last 72 hours.  Invalid input(s): "APPERANCEUR"    Imaging: US Paracentesis  Result Date: 11/11/2022 INDICATION: History of CHF with fluid overload and ascites. Request for diagnostic and therapeutic paracentesis with 2 L maximum. Last US paracentesis with 1.5 L removed. EXAM: ULTRASOUND GUIDED DIAGNOSTIC and THERAPEUTIC PARACENTESIS MEDICATIONS: 10 cc 1% lidocaine COMPLICATIONS: None immediate. PROCEDURE: Informed written  consent was obtained from the patient after a discussion of the risks, benefits and alternatives to treatment. A timeout was performed prior to the initiation of the procedure. Initial ultrasound scanning demonstrates a large amount of ascites within the right upper abdominal quadrant. The right upper abdomen was prepped and draped in the usual sterile fashion. 1% lidocaine was used for local anesthesia. Following this, a 19 gauge, 7-cm, Yueh catheter was introduced. An ultrasound image was saved for documentation purposes. The paracentesis was performed. The catheter was removed and a dressing was applied. The patient tolerated the procedure well without immediate post procedural complication. FINDINGS: A total of approximately 2 L of serous ascitic fluid was removed. Samples were sent to the laboratory as requested by the clinical team. IMPRESSION: Successful ultrasound-guided diagnostic and therapeutic paracentesis  yielding 2 liters of peritoneal fluid. Procedure performed by Mina Marble, PA-C Electronically Signed   By: Roanna Banning M.D.   On: 11/11/2022 07:00     Medications:       Chlorhexidine Gluconate Cloth  6 each Topical Q0600   feeding supplement (NEPRO CARB STEADY)  237 mL Oral TID BM   heparin  5,000 Units Subcutaneous Q8H   insulin aspart  0-5 Units Subcutaneous QHS   insulin aspart  0-6 Units Subcutaneous TID WC   insulin aspart  1 Units Subcutaneous TID WC   insulin glargine-yfgn  3 Units Subcutaneous Daily   multivitamin  1 tablet Per Tube QHS   sodium chloride flush  3 mL Intravenous Q12H   vancomycin  125 mg Oral QID   acetaminophen **OR** acetaminophen, alteplase, heparin, ondansetron **OR** ondansetron (ZOFRAN) IV, sodium chloride flush  Assessment/ Plan:  Shawn [name] is a 67 y.o.  male with end stage renal disease on hemodialysis, hypertension, diabetes mellitus type II, congestive heart failure, coronary artery disease, CVA, alcohol abuse, tobacco abuse and hypothyroidism   [facility]/195 minutes / 65 kg/right IJ PermCath/MWF  End-stage renal disease with hyperkalemia on hemodialysis.  Potassium corrected, dialysis today. UF 2.5L as tolerated. Next treatment scheduled for Monday  2. Fluid volume overload, Moderate to large ascites seen on CT abd and pelvis.  2 L fluid removed with paracentesis on 11/10/2022. Will continue to remove fluid with dialysis.   3.  Hyperglycemia with diabetes mellitus type II with chronic kidney disease with diabetic ketoacidosis: Insulin-dependent with Lantus.  Was hypoglycemic, 30s on EMS arrival. Glucose elevated yesterday.   4. Anemia of chronic kidney disease Lab Results  Component Value Date   HGB 9.7 (L) 11/12/2022    Patient receives Mircera at outpatient clinic. Hgb remains at goal.   5.  Hypertension with chronic kidney disease.  Home regimen includes carvedilol and furosemide.  Now  currently held. Blood pressure 123/74 during dialysis    LOS: 2 Addisyn Leclaire 11/8/202412:48 PM

## 2022-11-12 NOTE — Progress Notes (Signed)
Pt family updated of room number. SRP, RN

## 2022-11-12 NOTE — Progress Notes (Signed)
PT Cancellation Note  Patient Details Name: Shawn [name] MRN: [id number] DOB: 25-Apr-1955   Cancelled Treatment:     Pt off floor at dialysis, will re-attempt next available date/time per POC.   Jannet Askew 11/12/2022, 12:28 PM

## 2022-11-12 NOTE — Progress Notes (Signed)
PROGRESS NOTE    Shawn [name]  GUY:[id number] DOB: 28-Feb-1955 DOA: 11/10/2022 PCP: [facility]  [id number]  LOS: 2 days   Brief hospital course:   Assessment & Plan: Shawn [name] is a 67 y.o. male with medical history significant of HFrEF, ESRD on hemodialysis Monday Wednesday Friday, substance abuse, history of CVA presenting with hypoglycemia, hypothermia, ascites, diarrhea.  Limited history in setting of generalized fatigue.  Per report, patient with worsening generalized malaise.  Patient was concerned about hypoglycemia.  Patient reports taking his insulin yesterday.  Patient unclear if he increase the dose.  Mild decreased p.o. intake.  No chest pain or shortness of breath.  Positive abdominal distention.    * Hypervolemia ESRD on dialysis [facility]) on MWF Noted baseline ESRD, HFrEF with general volume overload on presentation 1-2+ pitting edema bilaterally Moderate to large volume ascites on CT scan --s/p paracentesis on 11/6 with 2L removed Plan: --iHD per nephro  Hypoglycemia Blood sugar in the 60s on presentation Noted baseline noncompliance as well as admission for DKA October 2024 Some concern for labile blood sugar versus brittle diabetes.  Pt also seemed not clear about insulin dose --resolved with D50.  Hypothermia T 95.1 on presentation.    Diarrhea 2/2 C diff infection Patient reports multiple rounds of loose diarrhea over multiple days CT abdomen pelvis with no overt signs of colitis at present White count within normal limits Minimal to mild abdominal pain on presentation --C diff antigen pos, toxin neg, PCR pos.  Started on oral vanc Plan: --cont oral vanc  Essential hypertension BP stable and wnl without antihypertensives   Iron deficiency anemia Hgb 9's At baseline   Smoker 1/2 pack/day smoker Discussed cessation at length by admitting physician  Alcohol abuse Patient denies any active alcohol use  HLD  (hyperlipidemia) Statin  HFrEF (heart failure with reduced ejection fraction) ([name]) 2D echo June 2023 with a EF of 20 to 25% and grade 2 diastolic dysfunction Positive volume overload in setting of baseline ESRD-dialysis dependent volume status Pending hemodialysis Will plan for repeat 2D echo Trop pending  Ascites also confounding issue Cardiology consult as clinically indicated  Hyperkalemia K5.6 on presentation.   Status post Lokelma in the ER --correct with HD  DM --Recent A1c 11, poorly controlled -cont Semglee 3 units daily -increase Novolog to 2 unit meal coverage tid  --ACHS and SSI  Severe malnutrition in context of chronic illness  --supplements per dietician   Hyponatremia, chronic --Na stable in low 130's   DVT prophylaxis: Heparin SQ Code Status: Full code  Family Communication:  Level of care: Med-Surg Dispo:   The patient is from: home Anticipated d/c is to: home Anticipated d/c date is: 1-2 days   Subjective and Interval History:  Pt reported no pain, just hungry.   Objective: Vitals:   11/12/22 1216 11/12/22 1217 11/12/22 1258 11/12/22 1651  BP: 123/74  136/86 122/78  Pulse: 69  68 71  Resp: 15  17   Temp: 98.6 F (37 C)  (!) 97.4 F (36.3 C)   TempSrc: Oral  Oral   SpO2: 100%  99% 99%  Weight:  123.4 kg     No intake or output data in the 24 hours ending 11/12/22 1706  Filed Weights   11/10/22 0307 11/12/22 0800 11/12/22 1217  Weight: 65.3 kg 125.9 kg 123.4 kg    Examination:   Constitutional: NAD, AAOx3 HEENT: conjunctivae and lids normal, EOMI CV: No cyanosis.   RESP: normal  respiratory effort, on RA SKIN: warm, dry Neuro: II - XII grossly intact.   Psych: Normal mood and affect.     Data Reviewed: I have personally reviewed labs and imaging studies  Time spent: 50 minutes  Darlin Priestly, MD [facility] If 7PM-7AM, please contact night-coverage 11/12/2022, 5:06 PM

## 2022-11-12 NOTE — Progress Notes (Signed)
Report called to [place], RN pt to transfer to room 224- 2C. SRP, RN

## 2022-11-13 DIAGNOSIS — A498 Other bacterial infections of unspecified site: Secondary | ICD-10-CM

## 2022-11-13 DIAGNOSIS — N186 End stage renal disease: Secondary | ICD-10-CM

## 2022-11-13 DIAGNOSIS — E875 Hyperkalemia: Secondary | ICD-10-CM

## 2022-11-13 DIAGNOSIS — I10 Essential (primary) hypertension: Secondary | ICD-10-CM

## 2022-11-13 DIAGNOSIS — D509 Iron deficiency anemia, unspecified: Secondary | ICD-10-CM

## 2022-11-13 DIAGNOSIS — E8770 Fluid overload, unspecified: Secondary | ICD-10-CM | POA: Diagnosis not present

## 2022-11-13 DIAGNOSIS — Z992 Dependence on renal dialysis: Secondary | ICD-10-CM

## 2022-11-13 DIAGNOSIS — E162 Hypoglycemia, unspecified: Secondary | ICD-10-CM

## 2022-11-13 DIAGNOSIS — I5020 Unspecified systolic (congestive) heart failure: Secondary | ICD-10-CM

## 2022-11-13 LAB — GLUCOSE, CAPILLARY
Glucose-Capillary: 192 mg/dL — ABNORMAL HIGH (ref 70–99)
Glucose-Capillary: 225 mg/dL — ABNORMAL HIGH (ref 70–99)
Glucose-Capillary: 245 mg/dL — ABNORMAL HIGH (ref 70–99)
Glucose-Capillary: 184 mg/dL — ABNORMAL HIGH (ref 70–99)
Glucose-Capillary: 163 mg/dL — ABNORMAL HIGH (ref 70–99)

## 2022-11-13 NOTE — Plan of Care (Signed)
Shawn [name]

## 2022-11-13 NOTE — Progress Notes (Addendum)
[facility]  ROUNDING NOTE   Subjective:   Shawn [name] is a 67 y.o. male with past medical conditions including end stage renal disease on hemodialysis, hypertension, diabetes mellitus type II, congestive heart failure, coronary artery disease, CVA, alcohol abuse, tobacco abuse and hypothyroidism. He presents to the ED with hypoglycemia. Patient has been admitted for Hypervolemia [E87.70] Hyperkalemia [E87.5] Hypoglycemia [E16.2] Other ascites [R18.8] Bilateral pleural effusion [J90] Nausea and vomiting in adult [R11.2]  Patient is known to our practice and receives outpatient dialysis treatments at [facility] on a MWF schedule, supervised by Dr Thedore Mins.    Patient seen laying in bed Reports decreased diarrhea States he is ready for discharge Mostly completed breakfast tray at bedside  Lower extremity edema greatly improved   Objective:  Vital signs in last 24 hours:  Temp:  [97.4 F (36.3 C)-98.6 F (37 C)] 98.2 F (36.8 C) (11/09 0730) Pulse Rate:  [68-75] 75 (11/09 0730) Resp:  [11-18] 18 (11/09 0730) BP: (91-136)/(70-86) 131/78 (11/09 0730) SpO2:  [99 %-100 %] 100 % (11/09 0730) Weight:  [123.4 kg] 123.4 kg (11/08 1217)  Weight change:  Filed Weights   11/10/22 0307 11/12/22 0800 11/12/22 1217  Weight: 65.3 kg 125.9 kg 123.4 kg    Intake/Output: I/O last 3 completed shifts: In: 237 [P.O.:237] Out: -    Intake/Output this shift:  No intake/output data recorded.  Physical Exam: General: NAD  Head: Normocephalic, atraumatic. Moist oral mucosal membranes  Eyes: Anicteric  Lungs:  Clear to auscultation, room air  Heart: Regular rate and rhythm  Abdomen:  Soft, nontender, mildly distended  Extremities: 1+ left leg peripheral edema.  Neurologic: Alert, able to answer questions appropriately  Skin: No lesions  Access: Right PermCath    Basic Metabolic Panel: Recent Labs  Lab 11/10/22 0325 11/11/22 0434 11/12/22 0403  NA 130*  133* 133*  K 5.6* 4.0 4.6  CL 97* 96* 97*  CO2 22 26 24   GLUCOSE 136* 148* 154*  BUN 108* 60* 79*  CREATININE 8.01* 4.83* 5.84*  CALCIUM 8.2* 8.1* 8.2*  MG  --   --  1.7  PHOS  --   --  4.8*    Liver Function Tests: Recent Labs  Lab 11/10/22 0325 11/11/22 0434  AST 29 18  ALT 19 14  ALKPHOS 98 86  BILITOT 0.8 0.8  PROT 6.5 6.4*  ALBUMIN 2.7* 2.8*   Recent Labs  Lab 11/10/22 0325  LIPASE 21   No results for input(s): "AMMONIA" in the last 168 hours.  CBC: Recent Labs  Lab 11/10/22 0325 11/11/22 0434 11/12/22 0403  WBC 4.0 3.2* 3.3*  NEUTROABS 2.9  --   --   HGB 9.6* 9.5* 9.7*  HCT 29.6* 28.2* 29.3*  MCV 94.0 89.2 91.3  PLT 275 241 255    Cardiac Enzymes: No results for input(s): "CKTOTAL", "CKMB", "CKMBINDEX", "TROPONINI" in the last 168 hours.  BNP: Invalid input(s): "POCBNP"  CBG: Recent Labs  Lab 11/12/22 1652 11/12/22 2111 11/12/22 2357 11/13/22 0519 11/13/22 0727  GLUCAP 221* 175* 139* 192* 225*    Microbiology: Results for orders placed or performed during the hospital encounter of 11/10/22  Body fluid culture w Gram Stain     Status: None (Preliminary result)   Collection Time: 11/10/22  4:24 PM   Specimen: PATH Cytology Peritoneal fluid  Result Value Ref Range Status   Specimen Description   Final    PERITONEAL Performed at [facility], [address]  [place], [place] [address]    Special Requests   Final    PERITONEAL Performed at [facility], [address] [place], [place] [address]    Gram Stain   Final    WBC PRESENT, PREDOMINANTLY MONONUCLEAR NO ORGANISMS SEEN CYTOSPIN SMEAR    Culture   Final    NO GROWTH 3 DAYS Performed at [facility], [address], [place], [place] [address]    Report Status PENDING  Incomplete  Gastrointestinal Panel by PCR , Stool     Status: None   Collection Time: 11/11/22  8:32 AM   Specimen: STOOL  Result Value Ref Range Status   Campylobacter species NOT  DETECTED NOT DETECTED Final   Plesimonas shigelloides NOT DETECTED NOT DETECTED Final   Salmonella species NOT DETECTED NOT DETECTED Final   Yersinia enterocolitica NOT DETECTED NOT DETECTED Final   Vibrio species NOT DETECTED NOT DETECTED Final   Vibrio cholerae NOT DETECTED NOT DETECTED Final   Enteroaggregative E coli (EAEC) NOT DETECTED NOT DETECTED Final   Enteropathogenic E coli (EPEC) NOT DETECTED NOT DETECTED Final   Enterotoxigenic E coli (ETEC) NOT DETECTED NOT DETECTED Final   Shiga like toxin producing E coli (STEC) NOT DETECTED NOT DETECTED Final   Shigella/Enteroinvasive E coli (EIEC) NOT DETECTED NOT DETECTED Final   Cryptosporidium NOT DETECTED NOT DETECTED Final   Cyclospora cayetanensis NOT DETECTED NOT DETECTED Final   Entamoeba histolytica NOT DETECTED NOT DETECTED Final   Giardia lamblia NOT DETECTED NOT DETECTED Final   Adenovirus F40/41 NOT DETECTED NOT DETECTED Final   Astrovirus NOT DETECTED NOT DETECTED Final   Norovirus GI/GII NOT DETECTED NOT DETECTED Final   Rotavirus A NOT DETECTED NOT DETECTED Final   Sapovirus (I, II, IV, and V) NOT DETECTED NOT DETECTED Final    Comment: Performed at [facility], [address] [place], [place] [address]  C Difficile Quick Screen w PCR reflex     Status: Abnormal   Collection Time: 11/11/22  8:35 AM   Specimen: STOOL  Result Value Ref Range Status   C Diff antigen POSITIVE (A) NEGATIVE Final   C Diff toxin NEGATIVE NEGATIVE Final   C Diff interpretation Results are indeterminate. See PCR results.  Final    Comment: Performed at [facility], [address] [place], [place] [address]  C. Diff by PCR, Reflexed     Status: Abnormal   Collection Time: 11/11/22  8:35 AM  Result Value Ref Range Status   Toxigenic C. Difficile by PCR POSITIVE (A) NEGATIVE Final    Comment: Positive for toxigenic C. difficile with little to no toxin production. Only treat if clinical presentation suggests symptomatic  illness. Performed at [facility], [address] [place], [place] [address]     Coagulation Studies: No results for input(s): "LABPROT", "INR" in the last 72 hours.  Urinalysis: No results for input(s): "COLORURINE", "LABSPEC", "PHURINE", "GLUCOSEU", "HGBUR", "BILIRUBINUR", "KETONESUR", "PROTEINUR", "UROBILINOGEN", "NITRITE", "LEUKOCYTESUR" in the last 72 hours.  Invalid input(s): "APPERANCEUR"    Imaging: No results found.   Medications:       Chlorhexidine Gluconate Cloth  6 each Topical Q0600   feeding supplement (NEPRO CARB STEADY)  237 mL Oral TID BM   heparin  5,000 Units Subcutaneous Q8H   insulin aspart  0-5 Units Subcutaneous QHS   insulin aspart  0-6 Units Subcutaneous TID WC   insulin aspart  2 Units Subcutaneous TID WC   insulin glargine-yfgn  3 Units  Subcutaneous Daily   multivitamin  1 tablet Per Tube QHS   sodium chloride flush  3 mL Intravenous Q12H   vancomycin  125 mg Oral QID   acetaminophen **OR** acetaminophen, ondansetron **OR** ondansetron (ZOFRAN) IV, sodium chloride flush  Assessment/ Plan:  Mr. MARUIN EMBREE is a 67 y.o.  male with end stage renal disease on hemodialysis, hypertension, diabetes mellitus type II, congestive heart failure, coronary artery disease, CVA, alcohol abuse, tobacco abuse and hypothyroidism   Outpatient dialysis-[facility]/195 minutes / 65 kg/right IJ PermCath/MWF  End-stage renal disease with hyperkalemia on hemodialysis.  Potassium corrected.  Dialysis received yesterday.  Next treatment scheduled for Monday  2. Fluid volume overload, Moderate to large ascites seen on CT abd and pelvis.  2 L fluid removed with paracentesis on 11/10/2022.  Will continue fluid management with dialysis.  3.  Hypoglycemia, 30s on EMS arrival.  Glucose remains elevated at times.  Sliding scale managed by primary team.  4. Anemia of chronic kidney disease Lab Results  Component Value Date   HGB 9.7 (L)  11/12/2022    Patient receives Mircera at outpatient clinic. Hgb acceptable for this patient.  Will continue to monitor and assess need for ESA.  5.  Hypertension with chronic kidney disease.  Home regimen includes carvedilol and furosemide.  Now currently held. Blood pressure satisfactory.    LOS: 3 Shantelle Breeze 11/9/202411:26 AM   Patient was seen and examined with Wendee Beavers, NP.  I personally formulated plan of care for the problems addressed and discussed with NP.  I agree with the note as documented except as noted below. I take the responsibility for the inherent risk of patient management.

## 2022-11-13 NOTE — TOC Initial Note (Signed)
Transition of Care [facility]) - Initial/Assessment Note    Patient Details  Name: Shawn [name] MRN: [id number] Date of Birth: [birth year]  Transition [facility]) CM/SW Contact:    Shawn Cline, LCSW Phone Number: 11/13/2022, 12:40 PM  Clinical Narrative:                 CSW attempted to speak with patient regarding PT recs for [facility]. Patient defers to sister Shawn [name]. Per chart review, patient lives with sister who provides 24 hour supervision - however sister states patient lives alone at address listed in chart.  She states patient would move in with her if she felt he could no longer live alone. She states she lives 5 minutes away from the patient's home. Patient goes to dialysis at [name] Monday, Wednesday, and Friday mornings using [name] or [name]. [name]. Shawn [name] states she is working on Higher education careers adviser up through an agency called [employer]. Shawn [name] states they were unable to come out after the patient's previous admission due to patient coming back to the hospital. Shawn [name] states she needs to set up services to come help patient get ready for dialysis.  Shawn [name] is agreeable to Home Health, but only wants nursing and aide for patient. She states he would not want to do HHPT. Shawn [name] denies agency preference. Referral made to [name]. Patient listed as [name] Medicare, CSW also asked Shawn [name] at [name] if patient has VA benefits for [name] or other personal care services.  Shawn [name] also states she has reached out to [name] care and they are supposed to come to patient's home on Thursday to get started.     Expected Discharge Plan: [name] Barriers to Discharge: Continued Medical Work up   Patient Goals and CMS Choice Patient states their goals for this hospitalization and ongoing recovery are:: to return home CMS Medicare.gov Compare Post Acute Care list provided to:: Patient Choice offered to /  list presented to : Patient, Sibling      Expected Discharge Plan and Services       Living arrangements for the past 2 months: Single Family Home                                      Prior Living Arrangements/Services Living arrangements for the past 2 months: Single Family Home Lives with:: Self Patient language and need for interpreter reviewed:: Yes Do you feel safe going back to the place where you live?: Yes      Need for Family Participation in Patient Care: Yes (Comment) Care giver support system in place?: Yes (comment) Current home services: DME Criminal Activity/Legal Involvement Pertinent to Current Situation/Hospitalization: No - Comment as needed  Activities of Daily Living   ADL Screening (condition at time of admission) Independently performs ADLs?: No Does the patient have a NEW difficulty with bathing/dressing/toileting/self-feeding that is expected to last >3 days?: No Does the patient have a NEW difficulty with getting in/out of bed, walking, or climbing stairs that is expected to last >3 days?: No Does the patient have a NEW difficulty with communication that is expected to last >3 days?: No Is the patient deaf or have difficulty hearing?: No Does the patient have difficulty seeing, even when wearing glasses/contacts?: No Does the patient have difficulty concentrating, remembering, or making decisions?: No  Permission Sought/Granted Permission sought to share information with : Facility Medical sales representative, Family Supports Permission granted to share information with : Yes, Verbal Permission Granted     Permission granted to share info w AGENCY: HH agencies  Permission granted to share info w Relationship: Shawn [name]     Emotional Assessment       Orientation: : Oriented to Self, Oriented to Place, Oriented to  Time Alcohol / Substance Use: Not Applicable Psych Involvement: No (comment)  Admission diagnosis:  Hypervolemia  [E87.70] Hyperkalemia [E87.5] Hypoglycemia [E16.2] Other ascites [R18.8] Bilateral pleural effusion [J90] Nausea and vomiting in adult [R11.2] Patient Active Problem List   Diagnosis Date Noted   Hypervolemia 11/10/2022   Diarrhea 11/10/2022   Diabetic ketoacidosis without coma associated with type 2 diabetes mellitus ([name]) 10/27/2022   Sepsis ([name]) 09/03/2022   HFrEF (heart failure with reduced ejection fraction) ([name]) 09/03/2022   Nausea and vomiting 08/30/2022   ESRD on dialysis ([name]) 08/29/2022   Acute exacerbation of CHF (congestive heart failure) ([name]) 08/13/2021   Iron deficiency anemia 07/01/2021   Hyperosmolar hyperglycemic state (HHS) ([name]) 03/16/2020   Acute metabolic encephalopathy 03/16/2020   Elevated troponin 03/16/2020   HLD (hyperlipidemia) 03/16/2020   Abnormal CXR 03/16/2020   Hypothermia 03/16/2020   Hypotension 04/30/2019   Stroke ([name])    Protein-calorie malnutrition, severe ([name]) 04/25/2019   Hyperkalemia 04/23/2019   Acute kidney injury superimposed on CKD 3b ([name]) 04/23/2019   C. difficile colitis 04/23/2019   Hypoglycemia 09/22/2018   Essential hypertension 05/12/2018   Chronic HFrEF (heart failure with reduced ejection fraction) ([name]) 12/15/2017   NICM (nonischemic cardiomyopathy) ([name]) 05/13/2017   T2DM (type 2 diabetes mellitus) ([name]) 05/13/2017   Smoker 05/13/2017   Alcohol abuse 05/13/2017   PCP:  [facility]:   [facility] - [place], [place] - [address] [place] [address] Phone: [phone] Fax: [phone]  [facility] [address]), [place] - [address] [place]) [place] [address] Phone: [phone] Fax: [phone]  [facility] [address] [place] [address] Phone: [phone] Fax: [phone]  [facility] [place], [place] - [address] [place] [address] Phone: [phone] Fax: [phone]     Social Determinants of Health (SDOH) Social History: SDOH Screenings   Food Insecurity: No Food Insecurity (11/10/2022)  Housing: Low Risk  (11/10/2022)  Transportation Needs: Unmet Transportation Needs (11/10/2022)  Utilities: Not At Risk (11/10/2022)  Financial Resource Strain: High Risk (08/24/2019)   Received from [facility], [facility]  Social Connections: Moderately Isolated (08/24/2019)   Received from [facility], [facility]  Stress: No Stress Concern Present (08/24/2019)   Received from [facility], [facility]  Tobacco Use: High Risk (11/10/2022)   SDOH Interventions:     Readmission Risk Interventions    09/04/2022   11:28 AM 06/24/2021    2:57 PM 01/08/2021    1:22 PM  Readmission Risk Prevention Plan  Transportation Screening Complete Complete Complete  PCP or Specialist Appt within 3-5 Days Complete    HRI or Home Care Consult Complete    Social Work Consult for Recovery Care Planning/Counseling Complete  Complete  Palliative Care Screening Not Applicable  Not Applicable  Medication Review Oceanographer) Complete Complete Complete  Palliative Care Screening  Not Applicable   Skilled  Nursing Facility  Not Applicable

## 2022-11-13 NOTE — Progress Notes (Signed)
PROGRESS NOTE    Shawn [name]  ZOX:[id number] DOB: 10-05-1955 DOA: 11/10/2022 PCP: [facility]  [id number]  LOS: 3 days   Brief hospital course: Shawn [name] is a 67 y.o. male with medical history significant of HFrEF, ESRD on hemodialysis Monday Wednesday Friday, substance abuse, history of CVA presenting with hypoglycemia, hypothermia, ascites, diarrhea.  Limited history in setting of generalized fatigue.  Per report, patient with worsening generalized malaise.  Patient was concerned about hypoglycemia.  Patient reports taking his insulin yesterday.  Patient unclear if he increase the dose.  Mild decreased p.o. intake.  No chest pain or shortness of breath.  Positive abdominal distention.   11/9: Patient still having some diarrhea and abdominal pain but overall improving.  Would like to spend another day to see continuation of improvement and likely be discharged tomorrow.  Assessment & Plan:   * Hypervolemia ESRD on dialysis ([name]) on MWF Noted baseline ESRD, HFrEF with general volume overload on presentation 1-2+ pitting edema bilaterally which has been improved Moderate to large volume ascites on CT scan --s/p paracentesis on 11/6 with 2L removed Plan: --iHD per nephro  Hypoglycemia Blood sugar in the 60s on presentation Noted baseline noncompliance as well as admission for DKA October 2024 Some concern for labile blood sugar versus brittle diabetes.  Pt also seemed not clear about insulin dose --resolved with D50.  Hypothermia T 95.1 on presentation.  Resolved  Diarrhea 2/2 C diff infection Patient reports multiple rounds of loose diarrhea over multiple days CT abdomen pelvis with no overt signs of colitis at present White count within normal limits Minimal to mild abdominal pain on presentation --C diff antigen pos, toxin neg, PCR pos.  Started on oral vanc Plan: --cont oral vanc  Essential hypertension BP stable and wnl without  antihypertensives   Iron deficiency anemia Hgb 9's At baseline   Smoker 1/2 pack/day smoker Discussed cessation at length by admitting physician  Alcohol abuse Patient denies any active alcohol use  HLD (hyperlipidemia) Statin  HFrEF (heart failure with reduced ejection fraction) ([name]) 2D echo June 2023 with a EF of 20 to 25% and grade 2 diastolic dysfunction Positive volume overload in setting of baseline ESRD-dialysis dependent volume status Ascites also confounding issue  Hyperkalemia K5.6 on presentation.  Has been resolved Status post Lokelma in the ER --correct with HD  DM --Recent A1c 11, poorly controlled -cont Semglee 3 units daily -increase Novolog to 2 unit meal coverage tid  --ACHS and SSI  Severe malnutrition in context of chronic illness  --supplements per dietician   Hyponatremia, chronic --Na stable in low 130's   DVT prophylaxis: Heparin SQ Code Status: Full code  Family Communication:  Level of care: Med-Surg Dispo:   The patient is from: home Anticipated d/c is to: home Anticipated d/c date is: 1-2 days   Subjective and Interval History:  Patient is having mild abdominal pain, had 2 bowel movements since this morning, slowly improving.   Objective: Vitals:   11/12/22 1651 11/12/22 2151 11/13/22 0404 11/13/22 0730  BP: 122/78 124/76 129/75 131/78  Pulse: 71 69 70 75  Resp:  17 18 18   Temp:  97.8 F (36.6 C) 98.2 F (36.8 C) 98.2 F (36.8 C)  TempSrc:  Oral Oral Oral  SpO2: 99% 99% 100% 100%  Weight:        Intake/Output Summary (Last 24 hours) at 11/13/2022 1412 Last data filed at 11/12/2022 2219 Gross per 24 hour  Intake 237 ml  Output --  Net 237 ml    Filed Weights   11/10/22 0307 11/12/22 0800 11/12/22 1217  Weight: 65.3 kg 125.9 kg 123.4 kg    Examination:   General.  Malnourished gentleman, in no acute distress. Pulmonary.  Lungs clear bilaterally, normal respiratory effort. CV.  Regular rate and rhythm, no  JVD, rub or murmur. Abdomen.  Soft, nontender, nondistended, BS positive. CNS.  Alert and oriented .  No focal neurologic deficit. Extremities.  No edema, no cyanosis, pulses intact and symmetrical. Psychiatry.  Judgment and insight appears normal.    Data Reviewed: I have personally reviewed labs and imaging studies  Time spent: 50 minutes  Arnetha Courser, MD [facility] If 7PM-7AM, please contact night-coverage 11/13/2022, 2:12 PM

## 2022-11-14 DIAGNOSIS — E8770 Fluid overload, unspecified: Secondary | ICD-10-CM | POA: Diagnosis not present

## 2022-11-14 DIAGNOSIS — E162 Hypoglycemia, unspecified: Secondary | ICD-10-CM | POA: Diagnosis not present

## 2022-11-14 DIAGNOSIS — Z515 Encounter for palliative care: Secondary | ICD-10-CM | POA: Diagnosis not present

## 2022-11-14 LAB — BODY FLUID CULTURE W GRAM STAIN: Culture: NO GROWTH

## 2022-11-14 LAB — GLUCOSE, CAPILLARY
Glucose-Capillary: 387 mg/dL — ABNORMAL HIGH (ref 70–99)
Glucose-Capillary: 315 mg/dL — ABNORMAL HIGH (ref 70–99)
Glucose-Capillary: 242 mg/dL — ABNORMAL HIGH (ref 70–99)
Glucose-Capillary: 202 mg/dL — ABNORMAL HIGH (ref 70–99)

## 2022-11-14 MED ORDER — RENA-VITE PO TABS
1.0000 | ORAL_TABLET | Freq: Every day | ORAL | Status: DC
  Administered 2022-11-15: 1 via ORAL
  Filled 2022-11-14: qty 1

## 2022-11-14 MED ORDER — INSULIN GLARGINE-YFGN 100 UNIT/ML ~~LOC~~ SOLN
2.0000 [IU] | Freq: Once | SUBCUTANEOUS | Status: AC
  Administered 2022-11-14: 2 [IU] via SUBCUTANEOUS
  Filled 2022-11-14: qty 0.02

## 2022-11-14 MED ORDER — INSULIN GLARGINE-YFGN 100 UNIT/ML ~~LOC~~ SOLN
5.0000 [IU] | Freq: Every day | SUBCUTANEOUS | Status: DC
  Administered 2022-11-15 – 2022-11-16 (×2): 5 [IU] via SUBCUTANEOUS
  Filled 2022-11-14 (×2): qty 0.05

## 2022-11-14 MED ORDER — FIDAXOMICIN 200 MG PO TABS
200.0000 mg | ORAL_TABLET | Freq: Two times a day (BID) | ORAL | Status: DC
  Administered 2022-11-14 – 2022-11-16 (×5): 200 mg via ORAL
  Filled 2022-11-14 (×6): qty 1

## 2022-11-14 NOTE — Progress Notes (Signed)
PROGRESS NOTE    Shawn [name]  GEX:[id number] DOB: [birth year] DOA: 11/10/2022 PCP: [facility]  [id number]  LOS: 4 days   Brief hospital course: Shawn [name] is a 67 y.o. male with medical history significant of HFrEF, ESRD on hemodialysis Monday Wednesday Friday, substance abuse, history of CVA presenting with hypoglycemia, hypothermia, ascites, diarrhea.  Limited history in setting of generalized fatigue.  Per report, patient with worsening generalized malaise.  Patient was concerned about hypoglycemia.  Patient reports taking his insulin yesterday.  Patient unclear if he increase the dose.  Mild decreased p.o. intake.  No chest pain or shortness of breath.  Positive abdominal distention.   11/9: Patient still having some diarrhea and abdominal pain but overall improving.  Would like to spend another day to see continuation of improvement and likely be discharged tomorrow.  Assessment & Plan:   * Hypervolemia ESRD on dialysis ([name]) on MWF Noted baseline ESRD, HFrEF with general volume overload on presentation 1-2+ pitting edema bilaterally which has been improved Moderate to large volume ascites on CT scan --s/p paracentesis on 11/6 with 2L removed Plan: --iHD per nephro  Hypoglycemia Blood sugar in the 60s on presentation Noted baseline noncompliance as well as admission for DKA October 2024 Some concern for labile blood sugar versus brittle diabetes.  Pt also seemed not clear about insulin dose --resolved with D50.  Hypothermia T 95.1 on presentation.  Resolved  Diarrhea 2/2 C diff infection Patient reports multiple rounds of loose diarrhea over multiple days CT abdomen pelvis with no overt signs of colitis at present White count within normal limits Minimal to mild abdominal pain on presentation --C diff antigen pos, toxin neg, PCR pos.  Started on oral vanc Plan: --switch to Dificid for easier administration after discharge.  Essential  hypertension BP stable and wnl without antihypertensives   Iron deficiency anemia Hgb 9's At baseline   Smoker 1/2 pack/day smoker Discussed cessation at length by admitting physician  Alcohol abuse Patient denies any active alcohol use  HLD (hyperlipidemia) Statin  HFrEF (heart failure with reduced ejection fraction) ([name]) 2D echo June 2023 with a EF of 20 to 25% and grade 2 diastolic dysfunction Positive volume overload in setting of baseline ESRD-dialysis dependent volume status Ascites also confounding issue  Hyperkalemia K5.6 on presentation.  Has been resolved Status post Lokelma in the ER --correct with HD  DM --Recent A1c 11, poorly controlled --increase glargine to 5u daily -cont Novolog to 2 unit meal coverage tid  --ACHS and SSI  Severe malnutrition in context of chronic illness  --supplements per dietician   Hyponatremia, chronic --Na stable in low 130's   DVT prophylaxis: Heparin SQ Code Status: Full code  Family Communication: sister Shawn [name] updated on the phone today Level of care: Med-Surg Dispo:   The patient is from: home Anticipated d/c is to: home Anticipated d/c date is: tomorrow   Subjective and Interval History:  Diarrhea improved.    Pt lives alone and does not reliably take his medication.  Sister requested another day so she can coordinate in-home help on Monday.   Objective: Vitals:   11/13/22 2100 11/14/22 0500 11/14/22 0741 11/14/22 1553  BP: 126/76  127/75 126/71  Pulse: 72  81 71  Resp: 17  18 18   Temp: 98.3 F (36.8 C)  98.2 F (36.8 C) 98.9 F (37.2 C)  TempSrc:    Oral  SpO2: 100%  97% 98%  Weight:  56.6 kg  Intake/Output Summary (Last 24 hours) at 11/14/2022 1915 Last data filed at 11/13/2022 2145 Gross per 24 hour  Intake 237 ml  Output --  Net 237 ml    Filed Weights   11/12/22 0800 11/12/22 1217 11/14/22 0500  Weight: 125.9 kg 123.4 kg 56.6 kg    Examination:   Constitutional: NAD, AAOx3 CV:  No cyanosis.   RESP: normal respiratory effort, on RA Psych: Normal mood and affect.     Data Reviewed: I have personally reviewed labs and imaging studies  Time spent: 35 minutes  Darlin Priestly, MD [facility] If 7PM-7AM, please contact night-coverage 11/14/2022, 7:15 PM

## 2022-11-14 NOTE — TOC Progression Note (Signed)
Transition of Care [facility]) - Progression Note    Patient Details  Name: Shawn [name] MRN: [id number] Date of Birth: November 25, 1955  Transition of Care [facility]) CM/SW Contact  Laruth Hanger Synetta Fail, LCSW Phone Number: 11/14/2022, 1:42 PM  Clinical Narrative:    Sent another encrypted email to [place] with the [facility] and requested she update weekday [facility] tomorrow with services patient may be eligible for through the [place].    Expected Discharge Plan: [name] Barriers to Discharge: Continued Medical Work up  Expected Discharge Plan and Services       Living arrangements for the past 2 months: Single Family Home                                       Social Determinants of Health (SDOH) Interventions SDOH Screenings   Food Insecurity: No Food Insecurity (11/10/2022)  Housing: Low Risk  (11/10/2022)  Transportation Needs: Unmet Transportation Needs (11/10/2022)  Utilities: Not At Risk (11/10/2022)  Financial Resource Strain: High Risk (08/24/2019)   Received from [facility], [facility]  Social Connections: Moderately Isolated (08/24/2019)   Received from [facility], [facility]  Stress: No Stress Concern Present (08/24/2019)   Received from [facility], [facility]  Tobacco Use: High Risk (11/10/2022)    Readmission Risk Interventions    09/04/2022   11:28 AM 06/24/2021    2:57 PM 01/08/2021    1:22 PM  Readmission Risk Prevention Plan  Transportation Screening Complete Complete Complete  PCP or Specialist Appt within 3-5 Days Complete    HRI or Home Care Consult Complete    Social Work Consult for Recovery Care Planning/Counseling Complete  Complete  Palliative Care Screening Not Applicable  Not Applicable  Medication Review Ocean[facility]) Complete Complete Complete  Palliative Care Screening  Not Applicable   Skilled Nursing Facility  Not  Applicable

## 2022-11-14 NOTE — Plan of Care (Signed)
  Problem: Fluid Volume: Goal: Ability to maintain a balanced intake and output will improve Outcome: Progressing   Problem: Coping: Goal: Ability to adjust to condition or change in health will improve Outcome: Progressing   Problem: Metabolic: Goal: Ability to maintain appropriate glucose levels will improve Outcome: Progressing   Problem: Nutritional: Goal: Maintenance of adequate nutrition will improve Outcome: Progressing

## 2022-11-14 NOTE — Progress Notes (Addendum)
[facility]  ROUNDING NOTE   Subjective:   Shawn [name] is a 67 y.o. male with past medical conditions including end stage renal disease on hemodialysis, hypertension, diabetes mellitus type II, congestive heart failure, coronary artery disease, CVA, alcohol abuse, tobacco abuse and hypothyroidism. He presents to the ED with hypoglycemia. Patient has been admitted for Hypervolemia [E87.70] Hyperkalemia [E87.5] Hypoglycemia [E16.2] Other ascites [R18.8] Bilateral pleural effusion [J90] Nausea and vomiting in adult [R11.2]  Patient is known to our practice and receives outpatient dialysis treatments at [facility] on a MWF schedule, supervised by Dr Thedore Mins.    Patient seen sitting up in bed, eyes closed States he feels well today No complaints to offer States diarrhea has decreased greatly Partially completed breakfast tray at bedside Room air   Objective:  Vital signs in last 24 hours:  Temp:  [98.2 F (36.8 C)-98.3 F (36.8 C)] 98.2 F (36.8 C) (11/10 0741) Pulse Rate:  [72-81] 81 (11/10 0741) Resp:  [17-19] 18 (11/10 0741) BP: (123-127)/(75-78) 127/75 (11/10 0741) SpO2:  [97 %-100 %] 97 % (11/10 0741) Weight:  [56.6 kg] 56.6 kg (11/10 0500)  Weight change: -69.3 kg Filed Weights   11/12/22 0800 11/12/22 1217 11/14/22 0500  Weight: 125.9 kg 123.4 kg 56.6 kg    Intake/Output: I/O last 3 completed shifts: In: 474 [P.O.:474] Out: -    Intake/Output this shift:  No intake/output data recorded.  Physical Exam: General: NAD  Head: Normocephalic, atraumatic. Moist oral mucosal membranes  Eyes: Anicteric  Lungs:  Clear to auscultation, room air  Heart: Regular rate and rhythm  Abdomen:  Soft, nontender, mildly distended  Extremities: 1+ left leg peripheral edema.  Neurologic: Alert, able to answer questions appropriately  Skin: No lesions  Access: Right PermCath    Basic Metabolic Panel: Recent Labs  Lab 11/10/22 0325 11/11/22 0434  11/12/22 0403  NA 130* 133* 133*  K 5.6* 4.0 4.6  CL 97* 96* 97*  CO2 22 26 24   GLUCOSE 136* 148* 154*  BUN 108* 60* 79*  CREATININE 8.01* 4.83* 5.84*  CALCIUM 8.2* 8.1* 8.2*  MG  --   --  1.7  PHOS  --   --  4.8*    Liver Function Tests: Recent Labs  Lab 11/10/22 0325 11/11/22 0434  AST 29 18  ALT 19 14  ALKPHOS 98 86  BILITOT 0.8 0.8  PROT 6.5 6.4*  ALBUMIN 2.7* 2.8*   Recent Labs  Lab 11/10/22 0325  LIPASE 21   No results for input(s): "AMMONIA" in the last 168 hours.  CBC: Recent Labs  Lab 11/10/22 0325 11/11/22 0434 11/12/22 0403  WBC 4.0 3.2* 3.3*  NEUTROABS 2.9  --   --   HGB 9.6* 9.5* 9.7*  HCT 29.6* 28.2* 29.3*  MCV 94.0 89.2 91.3  PLT 275 241 255    Cardiac Enzymes: No results for input(s): "CKTOTAL", "CKMB", "CKMBINDEX", "TROPONINI" in the last 168 hours.  BNP: Invalid input(s): "POCBNP"  CBG: Recent Labs  Lab 11/13/22 1125 11/13/22 1656 11/13/22 2102 11/14/22 0739 11/14/22 1140  GLUCAP 245* 184* 163* 387* 315*    Microbiology: Results for orders placed or performed during the hospital encounter of 11/10/22  Body fluid culture w Gram Stain     Status: None   Collection Time: 11/10/22  4:24 PM   Specimen: PATH Cytology Peritoneal fluid  Result Value Ref Range Status   Specimen Description   Final    PERITONEAL Performed at [facility], [address] [place], [place] [address]    Special Requests   Final    PERITONEAL Performed at [facility], [address] [place], [place] [address]    Gram Stain   Final    WBC PRESENT, PREDOMINANTLY MONONUCLEAR NO ORGANISMS SEEN CYTOSPIN SMEAR    Culture   Final    NO GROWTH 3 DAYS Performed at [facility], [address] [place], [place] [address]    Report Status 11/14/2022 FINAL  Final  Gastrointestinal Panel by PCR , Stool     Status: None   Collection Time: 11/11/22  8:32 AM   Specimen: STOOL  Result Value Ref Range Status   Campylobacter  species NOT DETECTED NOT DETECTED Final   Plesimonas shigelloides NOT DETECTED NOT DETECTED Final   Salmonella species NOT DETECTED NOT DETECTED Final   Yersinia enterocolitica NOT DETECTED NOT DETECTED Final   Vibrio species NOT DETECTED NOT DETECTED Final   Vibrio cholerae NOT DETECTED NOT DETECTED Final   Enteroaggregative E coli (EAEC) NOT DETECTED NOT DETECTED Final   Enteropathogenic E coli (EPEC) NOT DETECTED NOT DETECTED Final   Enterotoxigenic E coli (ETEC) NOT DETECTED NOT DETECTED Final   Shiga like toxin producing E coli (STEC) NOT DETECTED NOT DETECTED Final   Shigella/Enteroinvasive E coli (EIEC) NOT DETECTED NOT DETECTED Final   Cryptosporidium NOT DETECTED NOT DETECTED Final   Cyclospora cayetanensis NOT DETECTED NOT DETECTED Final   Entamoeba histolytica NOT DETECTED NOT DETECTED Final   Giardia lamblia NOT DETECTED NOT DETECTED Final   Adenovirus F40/41 NOT DETECTED NOT DETECTED Final   Astrovirus NOT DETECTED NOT DETECTED Final   Norovirus GI/GII NOT DETECTED NOT DETECTED Final   Rotavirus A NOT DETECTED NOT DETECTED Final   Sapovirus (I, II, IV, and V) NOT DETECTED NOT DETECTED Final    Comment: Performed at [facility], [address] [place], [place] [address]  C Difficile Quick Screen w PCR reflex     Status: Abnormal   Collection Time: 11/11/22  8:35 AM   Specimen: STOOL  Result Value Ref Range Status   C Diff antigen POSITIVE (A) NEGATIVE Final   C Diff toxin NEGATIVE NEGATIVE Final   C Diff interpretation Results are indeterminate. See PCR results.  Final    Comment: Performed at [facility], [address] [place], [place] [address]  C. Diff by PCR, Reflexed     Status: Abnormal   Collection Time: 11/11/22  8:35 AM  Result Value Ref Range Status   Toxigenic C. Difficile by PCR POSITIVE (A) NEGATIVE Final    Comment: Positive for toxigenic C. difficile with little to no toxin production. Only treat if clinical presentation suggests  symptomatic illness. Performed at [facility], [address] [place], [place] [address]     Coagulation Studies: No results for input(s): "LABPROT", "INR" in the last 72 hours.  Urinalysis: No results for input(s): "COLORURINE", "LABSPEC", "PHURINE", "GLUCOSEU", "HGBUR", "BILIRUBINUR", "KETONESUR", "PROTEINUR", "UROBILINOGEN", "NITRITE", "LEUKOCYTESUR" in the last 72 hours.  Invalid input(s): "APPERANCEUR"    Imaging: No results found.   Medications:       Chlorhexidine Gluconate Cloth  6 each Topical Q0600   feeding supplement (NEPRO CARB STEADY)  237 mL Oral TID BM   fidaxomicin  200 mg Oral BID   heparin  5,000 Units Subcutaneous Q8H   insulin aspart  0-5 Units Subcutaneous QHS   insulin aspart  0-6 Units Subcutaneous TID WC   insulin aspart  2 Units Subcutaneous TID WC   insulin glargine-yfgn  3 Units Subcutaneous Daily   multivitamin  1 tablet Per Tube QHS   sodium chloride flush  3 mL Intravenous Q12H   acetaminophen **OR** acetaminophen, ondansetron **OR** ondansetron (ZOFRAN) IV, sodium chloride flush  Assessment/ Plan:  [name] is a 67 y.o.  male with end stage renal disease on hemodialysis, hypertension, diabetes mellitus type II, congestive heart failure, coronary artery disease, CVA, alcohol abuse, tobacco abuse and hypothyroidism   Outpatient dialysis-[facility]/195 minutes / 65 kg/right IJ PermCath/MWF  End-stage renal disease with hyperkalemia on hemodialysis.  Potassium corrected.  Next treatment scheduled for Monday  2. Fluid volume overload, Moderate to large ascites seen on CT abd and pelvis.  2 L fluid removed with paracentesis on 11/10/2022.  Fluid volume greatly improved  3.  Hypoglycemia, 30s on EMS arrival.  Glucose remains elevated at times.  Sliding scale managed by primary team.  4. Anemia of chronic kidney disease Lab Results  Component Value Date   HGB 9.7 (L) 11/12/2022    Patient receives  Mircera at outpatient clinic. Hgb acceptable.  Will continue to monitor and assess need for ESA.  5.  Hypertension with chronic kidney disease.  Home regimen includes carvedilol and furosemide.  Now currently held.     LOS: 4 Shantelle Breeze 11/10/202412:25 PM   Patient was seen and examined with Wendee Beavers, NP.  I personally formulated plan of care for the problems addressed and discussed with NP.  I agree with the note as documented except as noted below. I take the responsibility for the inherent risk of patient management.   Continue MWF dialysis schedule Palliative care discussions ongoing.

## 2022-11-14 NOTE — Consult Note (Signed)
Palliative Medicine  Name: Shawn [name] Date: 11/14/2022 MRN: [id number]  DOB: 1955/02/20  Patient Care Team: Center, [facility] as PCP - General (General Practice) Mariah Milling, Tollie Pizza, MD as PCP - Cardiology (Cardiology) Solum, Marlana Salvage, MD as Physician Assistant (Endocrinology)    REASON FOR CONSULTATION: Shawn [name] is a 67 y.o. male with multiple medical problems including HFrEF, ESRD on hemodialysis Monday Wednesday Friday, history of substance abuse, history of CVA, who was admitted to hospital on 11/10/2022 with hypervolemia, hypoglycemia, and diarrhea.  Found to have ascites and underwent paracentesis.  Also found to have C. difficile colitis.  Palliative care consulted to address goals.  SOCIAL HISTORY:     reports that he has been smoking cigarettes. He has never used smokeless tobacco. He reports that he does not currently use alcohol. He reports that he does not use drugs.  Patient unmarried.  Lives at home alone.  Has sisters who are involved in his care.  Patient has two children who live out of state.  ADVANCE DIRECTIVES:  Not on file -reportedly his sister, [name], is his HCPOA.    CODE STATUS: Full code  PAST MEDICAL HISTORY: Past Medical History:  Diagnosis Date   Chronic combined systolic (congestive) and diastolic (congestive) heart failure ([name])    a. TTE 6/15: EF < 20%, mildly dilated LV, DD, mildly dilated LA, mod dilated RA, mild MR, mild to mod TR, mod increased posterior wall thickness, elevated LA and LVEDP; b. 4/12019 Echo: EF 20-25%, diff HK. Gr1 DD, nl RV fxn.   CKD (chronic kidney disease), stage II    Diabetes mellitus with complication ([name])    a. Prior admissions w/ DKA (last 12/2017).   Essential hypertension    NICM (nonischemic cardiomyopathy) ([name])    a. 06/2013 Echo: EF<20%; b. 06/2013 Cath: no signif dzs; c. 04/2017 Echo: 20-25%, gr1 DD.; d.05/2018 Echo: 25-30%   Polysubstance abuse ([name])    a. etoh and tobacco   Stroke  ([name])     PAST SURGICAL HISTORY:  Past Surgical History:  Procedure Laterality Date   CENTRAL LINE INSERTION N/A 06/29/2021   Procedure: CENTRAL LINE INSERTION;  Surgeon: Yvonne Kendall, MD;  Location: [facility] INVASIVE CV LAB;  Service: Cardiovascular;  Laterality: N/A;   IR PARACENTESIS  09/09/2022   NO PAST SURGERIES     RIGHT HEART CATH N/A 06/29/2021   Procedure: RIGHT HEART CATH;  Surgeon: Yvonne Kendall, MD;  Location: [facility] INVASIVE CV LAB;  Service: Cardiovascular;  Laterality: N/A;    HEMATOLOGY/ONCOLOGY HISTORY:  Oncology History   No history exists.    ALLERGIES:  is allergic to empagliflozin, pravastatin, and simvastatin.  MEDICATIONS:  Current Facility-Administered Medications  Medication Dose Route Frequency Provider Last Rate Last Admin   acetaminophen (TYLENOL) tablet 650 mg  650 mg Oral Q6H PRN Floydene Flock, MD       Or   acetaminophen (TYLENOL) suppository 650 mg  650 mg Rectal Q6H PRN Floydene Flock, MD       Chlorhexidine Gluconate Cloth 2 % PADS 6 each  6 each Topical Q0600 Wendee Beavers, NP   6 each at 11/13/22 0532   feeding supplement (NEPRO CARB STEADY) liquid 237 mL  237 mL Oral TID BM Darlin Priestly, MD   237 mL at 11/14/22 0903   heparin injection 5,000 Units  5,000 Units Subcutaneous Q8H Floydene Flock, MD   5,000 Units at 11/10/22 1332   insulin aspart (novoLOG) injection 0-5 Units  0-5 Units Subcutaneous QHS Darlin Priestly, MD       insulin aspart (novoLOG) injection 0-6 Units  0-6 Units Subcutaneous TID WC Darlin Priestly, MD   5 Units at 11/14/22 0807   insulin aspart (novoLOG) injection 2 Units  2 Units Subcutaneous TID WC Darlin Priestly, MD   2 Units at 11/14/22 7564   insulin glargine-yfgn [facility]) injection 3 Units  3 Units Subcutaneous Daily Darlin Priestly, MD   3 Units at 11/14/22 3329   multivitamin (RENA-VIT) tablet 1 tablet  1 tablet Per Tube Shellia Cleverly, MD   1 tablet at 11/13/22 2116   ondansetron (ZOFRAN) tablet 4 mg  4 mg Oral Q6H PRN Floydene Flock, MD       Or   ondansetron [facility]) injection 4 mg  4 mg Intravenous Q6H PRN Floydene Flock, MD       sodium chloride flush (NS) 0.9 % injection 3 mL  3 mL Intravenous Q12H Floydene Flock, MD   3 mL at 11/14/22 0903   sodium chloride flush (NS) 0.9 % injection 3 mL  3 mL Intravenous PRN Floydene Flock, MD       vancomycin (VANCOCIN) capsule 125 mg  125 mg Oral QID Darlin Priestly, MD   125 mg at 11/14/22 0902    VITAL SIGNS: BP 127/75 (BP Location: Left Arm)   Pulse 81   Temp 98.2 F (36.8 C)   Resp 18   Wt 124 lb 11.2 oz (56.6 kg)   SpO2 97%   BMI 17.39 kg/m  Filed Weights   11/12/22 0800 11/12/22 1217 11/14/22 0500  Weight: 277 lb 9 oz (125.9 kg) 272 lb 0.8 oz (123.4 kg) 124 lb 11.2 oz (56.6 kg)    Estimated body mass index is 17.39 kg/m as calculated from the following:   Height as of 10/27/22: 5\' 11"  (1.803 m).   Weight as of this encounter: 124 lb 11.2 oz (56.6 kg).  LABS: CBC:    Component Value Date/Time   WBC 3.3 (L) 11/12/2022 0403   HGB 9.7 (L) 11/12/2022 0403   HGB 15.7 06/18/2013 0427   HCT 29.3 (L) 11/12/2022 0403   HCT 46.5 06/18/2013 0427   PLT 255 11/12/2022 0403   PLT 345 06/18/2013 0427   MCV 91.3 11/12/2022 0403   MCV 99 06/18/2013 0427   NEUTROABS 2.9 11/10/2022 0325   NEUTROABS 3.1 06/18/2013 0427   LYMPHSABS 0.8 11/10/2022 0325   LYMPHSABS 1.5 06/18/2013 0427   MONOABS 0.2 11/10/2022 0325   MONOABS 0.9 06/18/2013 0427   EOSABS 0.1 11/10/2022 0325   EOSABS 0.1 06/18/2013 0427   BASOSABS 0.1 11/10/2022 0325   BASOSABS 0.1 06/18/2013 0427   Comprehensive Metabolic Panel:    Component Value Date/Time   NA 133 (L) 11/12/2022 0403   NA 136 07/14/2017 1053   NA 135 (L) 06/20/2013 0405   K 4.6 11/12/2022 0403   K 4.3 06/20/2013 0405   CL 97 (L) 11/12/2022 0403   CL 99 06/20/2013 0405   CO2 24 11/12/2022 0403   CO2 30 06/20/2013 0405   BUN 79 (H) 11/12/2022 0403   BUN 8 07/14/2017 1053   BUN 15 06/20/2013 0405   CREATININE 5.84 (H)  11/12/2022 0403   CREATININE 0.86 06/20/2013 0405   GLUCOSE 154 (H) 11/12/2022 0403   GLUCOSE 145 (H) 06/20/2013 0405   CALCIUM 8.2 (L) 11/12/2022 0403   CALCIUM 9.0 06/20/2013 0405   AST 18 11/11/2022 0434   AST  51 (H) 06/17/2013 1042   ALT 14 11/11/2022 0434   ALT 28 06/17/2013 1042   ALKPHOS 86 11/11/2022 0434   ALKPHOS 118 (H) 06/17/2013 1042   BILITOT 0.8 11/11/2022 0434   BILITOT 0.2 07/14/2017 1053   BILITOT 0.4 06/17/2013 1042   PROT 6.4 (L) 11/11/2022 0434   PROT 6.3 07/14/2017 1053   PROT 7.0 06/17/2013 1042   ALBUMIN 2.8 (L) 11/11/2022 0434   ALBUMIN 4.0 07/14/2017 1053   ALBUMIN 3.2 (L) 06/17/2013 1042    RADIOGRAPHIC STUDIES: US Paracentesis  Result Date: 11/11/2022 INDICATION: History of CHF with fluid overload and ascites. Request for diagnostic and therapeutic paracentesis with 2 L maximum. Last US paracentesis with 1.5 L removed. EXAM: ULTRASOUND GUIDED DIAGNOSTIC and THERAPEUTIC PARACENTESIS MEDICATIONS: 10 cc 1% lidocaine COMPLICATIONS: None immediate. PROCEDURE: Informed written consent was obtained from the patient after a discussion of the risks, benefits and alternatives to treatment. A timeout was performed prior to the initiation of the procedure. Initial ultrasound scanning demonstrates a large amount of ascites within the right upper abdominal quadrant. The right upper abdomen was prepped and draped in the usual sterile fashion. 1% lidocaine was used for local anesthesia. Following this, a 19 gauge, 7-cm, Yueh catheter was introduced. An ultrasound image was saved for documentation purposes. The paracentesis was performed. The catheter was removed and a dressing was applied. The patient tolerated the procedure well without immediate post procedural complication. FINDINGS: A total of approximately 2 L of serous ascitic fluid was removed. Samples were sent to the laboratory as requested by the clinical team. IMPRESSION: Successful ultrasound-guided diagnostic and  therapeutic paracentesis yielding 2 liters of peritoneal fluid. Procedure performed by Mina Marble, PA-C Electronically Signed   By: Roanna Banning M.D.   On: 11/11/2022 07:00   CT ABDOMEN PELVIS WO CONTRAST  Result Date: 11/10/2022 CLINICAL DATA:  67 year old male with abdominal pain and hypoglycemia. History of ascites, paracentesis last month. EXAM: CT ABDOMEN AND PELVIS WITHOUT CONTRAST TECHNIQUE: Multidetector CT imaging of the abdomen and pelvis was performed following the standard protocol without IV contrast. RADIATION DOSE REDUCTION: This exam was performed according to the departmental dose-optimization program which includes automated exposure control, adjustment of the mA and/or kV according to patient size and/or use of iterative reconstruction technique. COMPARISON:  Noncontrast CT Abdomen and Pelvis 08/28/2022. FINDINGS: Lower chest: Increased layering bilateral pleural effusions since August with simple fluid density. Moderate volume pleural fluid now with compressive lung base atelectasis. Underlying cardiomegaly not significantly changed. Dual lumen dialysis type vascular catheter visible at the cavoatrial junction. No pericardial effusion. Hepatobiliary: Moderate to large volume ascites with fairly simple fluid density. Fluid volume increased compared to 08/28/2022. Underlying volume of the noncontrast liver seems diminished, although the liver contour is not obviously nodular. Unremarkable gallbladder. Pancreas: Limited detail, no abnormality evident. Spleen: Diminutive and unremarkable series 2, image 28. Adrenals/Urinary Tract: Limited detail. Noncontrast adrenal glands, kidneys appear symmetric and unremarkable. Small volume of fluid within the urinary bladder which is mostly displaced toward the anterior lower abdominal wall. See sagittal image 55. Stomach/Bowel: Fluid-filled stomach, but no other dilated bowel loops in the abdomen or pelvis. Limited bowel detail due to abundant ascites  and lack of contrast. However, there is evidence of an air-filled retrocecal appendix located along the right gutter series 2, image 53. No pneumoperitoneum. Vascular/Lymphatic: Severe Aortoiliac calcified atherosclerosis. Normal caliber abdominal aorta. Vascular patency is not evaluated in the absence of IV contrast. No lymphadenopathy is evident. Reproductive: Ascites tracking in both  inguinal canals. Other: Moderate to large volume ascites in the pelvis. Simple fluid density. Musculoskeletal: Stable.  No acute osseous abnormality identified. IMPRESSION: 1. Moderate to large volume Ascites, increased compared to August CT. Also increased and now moderate bilateral layering pleural effusions with lung base atelectasis. 2. Fluid distended stomach, but other bowel loops are decompressed throughout the abdomen and pelvis. NG tube decompression might be valuable. 3. Cardiomegaly.  Advanced Aortic Atherosclerosis (ICD10-I70.0). 4. No other acute or inflammatory process evident on this noncontrast exam. Electronically Signed   By: Odessa Fleming M.D.   On: 11/10/2022 04:31   DG Chest Portable 1 View  Result Date: 10/27/2022 CLINICAL DATA:  Nausea/vomiting/abdominal pain EXAM: PORTABLE CHEST - 1 VIEW COMPARISON:  09/03/2022 FINDINGS: Stable right IJ hemodialysis catheter to the proximal right atrium. Resolution of the interstitial edema seen previously. Left retrocardiac consolidation/atelectasis. Stable borderline cardiomegaly. No effusion. Visualized bones unremarkable. IMPRESSION: Left retrocardiac consolidation/atelectasis. Electronically Signed   By: Corlis Leak M.D.   On: 10/27/2022 16:51    PERFORMANCE STATUS (ECOG) : 1 - Symptomatic but completely ambulatory  Review of Systems Unless otherwise noted, a complete review of systems is negative.  Physical Exam General: NAD Pulmonary: Unlabored Extremities: no edema, no joint deformities Skin: no rashes Neurological: Weakness but otherwise  nonfocal  IMPRESSION: Patient ESRD on HD, HFrEF, history of CVA, who was admitted to the hospital with hypervolemia, hypoglycemia, and diarrhea.  Found to have C. difficile colitis.  Palliative care consult was requested by patient's sister.  I met with patient and called his sister by phone.  At baseline, patient lives at home alone but has frequent involvement by his sisters to help with care.  Per his sister, [name], patient mostly spends his time in bed with his head under the covers and is extremely noncompliant with his medications and dialysis treatments.  She questions if patient has given up and is just ready to die.  She called [facility] who has scheduled a palliative care visit at home.   Patient disagrees with his sister's description of his home situation.  He tells me that he is doing well at home and is functionally independent, only requiring minimal assistance from his sisters.  Patient says he has a strong desire to continue living and is not interested in foregoing dialysis or other life-prolonging measures at this point.  We did discuss CODE STATUS and he said he would want an attempt at resuscitation but would not want his life prolonged long-term on a ventilator if care proved futile.  Patient says that he is committed to continuing dialysis and feels that he will be more compliant with his medications and other treatments.  Reportedly, his sister, [name], is his HCPOA.  I suggested that advance directives he brought to the hospital to be scanned into the chart.  PLAN: -Continue current scope of treatment -Full code -Recommend outpatient palliative care follow-up (patient pending visit with [facility]).    Time Total: 60 minutes  Visit consisted of counseling and education dealing with the complex and emotionally intense issues of symptom management and palliative care in the setting of serious and potentially life-threatening illness.Greater than 50%  of this time was  spent counseling and coordinating care related to the above assessment and plan.  Signed by: Laurette Schimke, PhD, NP-C

## 2022-11-15 ENCOUNTER — Other Ambulatory Visit (HOSPITAL_COMMUNITY): Payer: Self-pay

## 2022-11-15 DIAGNOSIS — E8770 Fluid overload, unspecified: Secondary | ICD-10-CM | POA: Diagnosis not present

## 2022-11-15 LAB — GLUCOSE, CAPILLARY
Glucose-Capillary: 371 mg/dL — ABNORMAL HIGH (ref 70–99)
Glucose-Capillary: 377 mg/dL — ABNORMAL HIGH (ref 70–99)
Glucose-Capillary: 279 mg/dL — ABNORMAL HIGH (ref 70–99)

## 2022-11-15 LAB — CBC
HCT: 27.9 % — ABNORMAL LOW (ref 39.0–52.0)
Hemoglobin: 9.2 g/dL — ABNORMAL LOW (ref 13.0–17.0)
MCH: 29.9 pg (ref 26.0–34.0)
MCHC: 33 g/dL (ref 30.0–36.0)
MCV: 90.6 fL (ref 80.0–100.0)
Platelets: 193 10*3/uL (ref 150–400)
RBC: 3.08 MIL/uL — ABNORMAL LOW (ref 4.22–5.81)
RDW: 14.2 % (ref 11.5–15.5)
WBC: 5 10*3/uL (ref 4.0–10.5)
nRBC: 0 % (ref 0.0–0.2)

## 2022-11-15 LAB — RENAL FUNCTION PANEL
Albumin: 2.7 g/dL — ABNORMAL LOW (ref 3.5–5.0)
Anion gap: 14 (ref 5–15)
BUN: 91 mg/dL — ABNORMAL HIGH (ref 8–23)
CO2: 21 mmol/L — ABNORMAL LOW (ref 22–32)
Calcium: 8.4 mg/dL — ABNORMAL LOW (ref 8.9–10.3)
Chloride: 93 mmol/L — ABNORMAL LOW (ref 98–111)
Creatinine, Ser: 6.13 mg/dL — ABNORMAL HIGH (ref 0.61–1.24)
GFR, Estimated: 9 mL/min — ABNORMAL LOW (ref >60)
Glucose, Bld: 493 mg/dL — ABNORMAL HIGH (ref 70–99)
Phosphorus: 4.8 mg/dL — ABNORMAL HIGH (ref 2.5–4.6)
Potassium: 5.3 mmol/L — ABNORMAL HIGH (ref 3.5–5.1)
Sodium: 128 mmol/L — ABNORMAL LOW (ref 135–145)

## 2022-11-15 LAB — MISC LABCORP TEST (SEND OUT): Labcorp test code: 9985

## 2022-11-15 MED ORDER — HEPARIN SODIUM (PORCINE) 1000 UNIT/ML IJ SOLN
INTRAMUSCULAR | Status: AC
  Filled 2022-11-15: qty 10

## 2022-11-15 MED ORDER — INSULIN ASPART 100 UNIT/ML IJ SOLN
3.0000 [IU] | Freq: Three times a day (TID) | INTRAMUSCULAR | Status: DC
  Administered 2022-11-16 (×2): 3 [IU] via SUBCUTANEOUS
  Filled 2022-11-15 (×2): qty 1

## 2022-11-15 NOTE — Progress Notes (Signed)
PT Cancellation Note  Patient Details Name: CHARRON KYLE MRN: [id number] DOB: 1955-10-25   Cancelled Treatment:     Pt not available in am due to dialysis, therapist returned in pm, pt politely declined any activity. Will re-attempt next available date/time per POC.   Jannet Askew 11/15/2022, 2:53 PM

## 2022-11-15 NOTE — Progress Notes (Signed)
Patient returned from Hemodialysis.  

## 2022-11-15 NOTE — Care Management Important Message (Signed)
Important Message  Patient Details  Name: TRAMAR LOSANO MRN: [id number] Date of Birth: 11-30-1955   Important Message Given:  Yes - Medicare IM     Olegario Messier A Pedram Goodchild 11/15/2022, 2:05 PM

## 2022-11-15 NOTE — Progress Notes (Signed)
[facility]  ROUNDING NOTE   Subjective:   Shawn [name] is a 67 y.o. male with past medical conditions including end stage renal disease on hemodialysis, hypertension, diabetes mellitus type II, congestive heart failure, coronary artery disease, CVA, alcohol abuse, tobacco abuse and hypothyroidism. He presents to the ED with hypoglycemia. Patient has been admitted for Hypervolemia [E87.70] Hyperkalemia [E87.5] Hypoglycemia [E16.2] Other ascites [R18.8] Bilateral pleural effusion [J90] Nausea and vomiting in adult [R11.2]  Patient is known to our practice and receives outpatient dialysis treatments at [facility] on a MWF schedule, supervised by Dr Thedore Mins.    Patient seen and evaluated during dialysis   HEMODIALYSIS FLOWSHEET:  Blood Flow Rate (mL/min): 199 mL/min Arterial Pressure (mmHg): -88.07 mmHg Venous Pressure (mmHg): 58.98 mmHg TMP (mmHg): 9.9 mmHg Ultrafiltration Rate (mL/min): 479 mL/min Dialysate Flow Rate (mL/min): 300 ml/min Dialysis Fluid Bolus: Normal Saline  According to staff, patient has been irritated.    Objective:  Vital signs in last 24 hours:  Temp:  [97.8 F (36.6 C)-98.9 F (37.2 C)] 98.1 F (36.7 C) (11/11 1141) Pulse Rate:  [42-87] 85 (11/11 1141) Resp:  [8-24] 14 (11/11 1141) BP: (126-150)/(64-84) 141/76 (11/11 1141) SpO2:  [96 %-100 %] 99 % (11/11 1141) Weight:  [56.5 kg-57.3 kg] 56.9 kg (11/11 1150)  Weight change: -0.045 kg Filed Weights   11/15/22 0500 11/15/22 0757 11/15/22 1150  Weight: 56.5 kg 57.3 kg 56.9 kg    Intake/Output: I/O last 3 completed shifts: In: 237 [P.O.:237] Out: -    Intake/Output this shift:  Total I/O In: -  Out: 1000 [Other:1000]  Physical Exam: General: NAD  Head: Normocephalic, atraumatic. Moist oral mucosal membranes  Eyes: Anicteric  Lungs:  Clear to auscultation, room air  Heart: Regular rate and rhythm  Abdomen:  Soft, nontender, mildly distended  Extremities: 1+  left leg peripheral edema.  Neurologic: Alert, able to answer questions appropriately  Skin: No lesions  Access: Right PermCath    Basic Metabolic Panel: Recent Labs  Lab 11/10/22 0325 11/11/22 0434 11/12/22 0403 11/15/22 0750  NA 130* 133* 133* 128*  K 5.6* 4.0 4.6 5.3*  CL 97* 96* 97* 93*  CO2 22 26 24  21*  GLUCOSE 136* 148* 154* 493*  BUN 108* 60* 79* 91*  CREATININE 8.01* 4.83* 5.84* 6.13*  CALCIUM 8.2* 8.1* 8.2* 8.4*  MG  --   --  1.7  --   PHOS  --   --  4.8* 4.8*    Liver Function Tests: Recent Labs  Lab 11/10/22 0325 11/11/22 0434 11/15/22 0750  AST 29 18  --   ALT 19 14  --   ALKPHOS 98 86  --   BILITOT 0.8 0.8  --   PROT 6.5 6.4*  --   ALBUMIN 2.7* 2.8* 2.7*   Recent Labs  Lab 11/10/22 0325  LIPASE 21   No results for input(s): "AMMONIA" in the last 168 hours.  CBC: Recent Labs  Lab 11/10/22 0325 11/11/22 0434 11/12/22 0403 11/15/22 0750  WBC 4.0 3.2* 3.3* 5.0  NEUTROABS 2.9  --   --   --   HGB 9.6* 9.5* 9.7* 9.2*  HCT 29.6* 28.2* 29.3* 27.9*  MCV 94.0 89.2 91.3 90.6  PLT 275 241 255 193    Cardiac Enzymes: No results for input(s): "CKTOTAL", "CKMB", "CKMBINDEX", "TROPONINI" in the last 168 hours.  BNP: Invalid input(s): "POCBNP"  CBG: Recent Labs  Lab 11/13/22 2102 11/14/22 0739 11/14/22 1140 11/14/22 1642 11/14/22 2018  GLUCAP 163* 387* 315* 242* 202*    Microbiology: Results for orders placed or performed during the hospital encounter of 11/10/22  Body fluid culture w Gram Stain     Status: None   Collection Time: 11/10/22  4:24 PM   Specimen: PATH Cytology Peritoneal fluid  Result Value Ref Range Status   Specimen Description   Final    PERITONEAL Performed at [facility], [address], [place], [place] [address]    Special Requests   Final    PERITONEAL Performed at [facility], [address] [place], [place] [address]    Gram Stain   Final    WBC PRESENT, PREDOMINANTLY MONONUCLEAR NO  ORGANISMS SEEN CYTOSPIN SMEAR    Culture   Final    NO GROWTH 3 DAYS Performed at [facility], [address], [place], [place] [address]    Report Status 11/14/2022 FINAL  Final  Gastrointestinal Panel by PCR , Stool     Status: None   Collection Time: 11/11/22  8:32 AM   Specimen: STOOL  Result Value Ref Range Status   Campylobacter species NOT DETECTED NOT DETECTED Final   Plesimonas shigelloides NOT DETECTED NOT DETECTED Final   Salmonella species NOT DETECTED NOT DETECTED Final   Yersinia enterocolitica NOT DETECTED NOT DETECTED Final   Vibrio species NOT DETECTED NOT DETECTED Final   Vibrio cholerae NOT DETECTED NOT DETECTED Final   Enteroaggregative E coli (EAEC) NOT DETECTED NOT DETECTED Final   Enteropathogenic E coli (EPEC) NOT DETECTED NOT DETECTED Final   Enterotoxigenic E coli (ETEC) NOT DETECTED NOT DETECTED Final   Shiga like toxin producing E coli (STEC) NOT DETECTED NOT DETECTED Final   Shigella/Enteroinvasive E coli (EIEC) NOT DETECTED NOT DETECTED Final   Cryptosporidium NOT DETECTED NOT DETECTED Final   Cyclospora cayetanensis NOT DETECTED NOT DETECTED Final   Entamoeba histolytica NOT DETECTED NOT DETECTED Final   Giardia lamblia NOT DETECTED NOT DETECTED Final   Adenovirus F40/41 NOT DETECTED NOT DETECTED Final   Astrovirus NOT DETECTED NOT DETECTED Final   Norovirus GI/GII NOT DETECTED NOT DETECTED Final   Rotavirus A NOT DETECTED NOT DETECTED Final   Sapovirus (I, II, IV, and V) NOT DETECTED NOT DETECTED Final    Comment: Performed at [facility], [address] [place], [place] [address]  C Difficile Quick Screen w PCR reflex     Status: Abnormal   Collection Time: 11/11/22  8:35 AM   Specimen: STOOL  Result Value Ref Range Status   C Diff antigen POSITIVE (A) NEGATIVE Final   C Diff toxin NEGATIVE NEGATIVE Final   C Diff interpretation Results are indeterminate. See PCR results.  Final    Comment: Performed at [facility],  [address] [place], [place] [address]  C. Diff by PCR, Reflexed     Status: Abnormal   Collection Time: 11/11/22  8:35 AM  Result Value Ref Range Status   Toxigenic C. Difficile by PCR POSITIVE (A) NEGATIVE Final    Comment: Positive for toxigenic C. difficile with little to no toxin production. Only treat if clinical presentation suggests symptomatic illness. Performed at [facility], [address] [place], [place] [address]     Coagulation Studies: No results for input(s): "LABPROT", "INR" in the last 72 hours.  Urinalysis: No results for input(s): "COLORURINE", "LABSPEC", "PHURINE", "GLUCOSEU", "HGBUR", "BILIRUBINUR", "KETONESUR", "PROTEINUR", "UROBILINOGEN", "NITRITE", "LEUKOCYTESUR" in the last 72 hours.  Invalid input(s): "APPERANCEUR"    Imaging:  No results found.   Medications:       Chlorhexidine Gluconate Cloth  6 each Topical Q0600   feeding supplement (NEPRO CARB STEADY)  237 mL Oral TID BM   fidaxomicin  200 mg Oral BID   heparin  5,000 Units Subcutaneous Q8H   insulin aspart  0-5 Units Subcutaneous QHS   insulin aspart  0-6 Units Subcutaneous TID WC   insulin aspart  2 Units Subcutaneous TID WC   insulin glargine-yfgn  5 Units Subcutaneous Daily   multivitamin  1 tablet Oral QHS   sodium chloride flush  3 mL Intravenous Q12H   acetaminophen **OR** acetaminophen, ondansetron **OR** ondansetron (ZOFRAN) IV, sodium chloride flush  Assessment/ Plan:  Mr. IRVINE MORDEN is a 67 y.o.  male with end stage renal disease on hemodialysis, hypertension, diabetes mellitus type II, congestive heart failure, coronary artery disease, CVA, alcohol abuse, tobacco abuse and hypothyroidism   Outpatient dialysis-[facility]/195 minutes / 65 kg/right IJ PermCath/MWF  End-stage renal disease with hyperkalemia on hemodialysis.  Potassium stable.  Receiving dialysis today, UF 1L as tolerated. Next treatment scheduled for Wednesday. Discharge  planning in progress.   2. Fluid volume overload, Moderate to large ascites seen on CT abd and pelvis.  2 L fluid removed with paracentesis on 11/10/2022.  Volume status adequate.   3.  Hypoglycemia, 30s on EMS arrival.  Glucose remains elevated at times.  Glucose elevated but better controlled. Sliding scale managed by primary team.  4. Anemia of chronic kidney disease Lab Results  Component Value Date   HGB 9.2 (L) 11/15/2022    Patient receives Mircera at outpatient clinic. Hgb within optimal range. Will consider low dose EPO with treatments.   5.  Hypertension with chronic kidney disease.  Home regimen includes carvedilol and furosemide.  Now currently 130/67 during dialysis.    LOS: 5 [place] 11/11/202412:10 PM

## 2022-11-15 NOTE — TOC Progression Note (Addendum)
Transition of Care [facility]) - Progression Note    Patient Details  Name: Shawn [name] MRN: [id number] Date of Birth: 09/28/1955  Transition of Care [facility]) CM/SW Contact  Truddie Hidden, RN Phone Number: 11/15/2022, 10:58 AM  Clinical Narrative:    Attempt to reach Wonda Cerise by phone at [phone] ext. [phone] to follow up on in home care benefit. No answer. Left a message requesting her to contact patient' sister.  Spoke with patient's sister [name] regarding his discharge plan. Patient will transport home via EMS. [name] will be able to let patient in the home. She is requesting a phone call when EMS arrives.     Expected Discharge Plan: [name] Barriers to Discharge: Continued Medical Work up  Expected Discharge Plan and Services       Living arrangements for the past 2 months: Single Family Home                                       Social Determinants of Health (SDOH) Interventions SDOH Screenings   Food Insecurity: No Food Insecurity (11/10/2022)  Housing: Low Risk  (11/10/2022)  Transportation Needs: Unmet Transportation Needs (11/10/2022)  Utilities: Not At Risk (11/10/2022)  Financial Resource Strain: High Risk (08/24/2019)   Received from [facility], [facility]  Social Connections: Moderately Isolated (08/24/2019)   Received from [facility], [facility]  Stress: No Stress Concern Present (08/24/2019)   Received from [facility], [facility]  Tobacco Use: High Risk (11/10/2022)    Readmission Risk Interventions    09/04/2022   11:28 AM 06/24/2021    2:57 PM 01/08/2021    1:22 PM  Readmission Risk Prevention Plan  Transportation Screening Complete Complete Complete  PCP or Specialist Appt within 3-5 Days Complete    HRI or Home Care Consult Complete    Social Work Consult for Recovery Care Planning/Counseling Complete   Complete  Palliative Care Screening Not Applicable  Not Applicable  Medication Review Oceanographer) Complete Complete Complete  Palliative Care Screening  Not Applicable   Skilled Nursing Facility  Not Applicable

## 2022-11-15 NOTE — Plan of Care (Signed)

## 2022-11-15 NOTE — Progress Notes (Signed)
PROGRESS NOTE    Shawn [name]  QIH:[id number] DOB: 30-Jul-1955 DOA: 11/10/2022 PCP: [facility]  [id number]  LOS: 5 days   Brief hospital course: Shawn [name] is a 67 y.o. male with medical history significant of HFrEF, ESRD on hemodialysis Monday Wednesday Friday, substance abuse, history of CVA presenting with hypoglycemia, hypothermia, ascites, diarrhea.  Limited history in setting of generalized fatigue.  Per report, patient with worsening generalized malaise.  Patient was concerned about hypoglycemia.  Patient reports taking his insulin yesterday.  Patient unclear if he increase the dose.  Mild decreased p.o. intake.  No chest pain or shortness of breath.  Positive abdominal distention.   11/9: Patient still having some diarrhea and abdominal pain but overall improving.  Would like to spend another day to see continuation of improvement and likely be discharged tomorrow.  Assessment & Plan:   * Hypervolemia ESRD on dialysis ([name]) on MWF Noted baseline ESRD, HFrEF with general volume overload on presentation 1-2+ pitting edema bilaterally which has been improved Moderate to large volume ascites on CT scan --s/p paracentesis on 11/6 with 2L removed Plan: --iHD per nephro  Hypoglycemia Blood sugar in the 60s on presentation Noted baseline noncompliance as well as admission for DKA October 2024 Some concern for labile blood sugar versus brittle diabetes.  Pt also seemed not clear about insulin dose --resolved with D50.  Hypothermia T 95.1 on presentation.  Resolved  Diarrhea 2/2 C diff infection Patient reports multiple rounds of loose diarrhea over multiple days CT abdomen pelvis with no overt signs of colitis at present White count within normal limits Minimal to mild abdominal pain on presentation --C diff antigen pos, toxin neg, PCR pos.  Started on oral vanc, switched to dificid on 11/10 for easier administration after discharge. Plan: --cont  Dificid  Essential hypertension BP stable and wnl without antihypertensives   Iron deficiency anemia Hgb 9's At baseline   Smoker 1/2 pack/day smoker Discussed cessation at length by admitting physician  Alcohol abuse Patient denies any active alcohol use  HLD (hyperlipidemia) --resume statin after discharge  HFrEF (heart failure with reduced ejection fraction) ([name]) 2D echo June 2023 with a EF of 20 to 25% and grade 2 diastolic dysfunction Positive volume overload in setting of baseline ESRD-dialysis dependent volume status Ascites also confounding issue  Hyperkalemia K5.6 on presentation.  Has been resolved Status post Lokelma in the ER --correct with HD  DM --Recent A1c 11, poorly controlled --cont glargine 5u daily --increase mealtime to 3u TID --ACHS and SSI  Severe malnutrition in context of chronic illness  --supplements per dietician   Hyponatremia, chronic --Na stable in low 130's   DVT prophylaxis: Heparin SQ Code Status: Full code  Family Communication:  Level of care: Med-Surg Dispo:   The patient is from: home Anticipated d/c is to: home Anticipated d/c date is: tomorrow   Subjective and Interval History:  No new complaints.  Discharge delayed due to holiday, and sister unable to find in-home help and EMS transport not available today.   Objective: Vitals:   11/15/22 1141 11/15/22 1150 11/15/22 1239 11/15/22 1541  BP: (!) 141/76  (!) 140/82 109/66  Pulse: 85   85  Resp: 14  16   Temp: 98.1 F (36.7 C)  98.6 F (37 C) 98.6 F (37 C)  TempSrc: Oral   Oral  SpO2: 99%  100% 97%  Weight:  56.9 kg      Intake/Output Summary (Last 24 hours) at 11/15/2022  1758 Last data filed at 11/15/2022 1141 Gross per 24 hour  Intake --  Output 1000 ml  Net -1000 ml    Filed Weights   11/15/22 0500 11/15/22 0757 11/15/22 1150  Weight: 56.5 kg 57.3 kg 56.9 kg    Examination:   Constitutional: NAD, alert, oriented to person and  place HEENT: conjunctivae and lids normal, EOMI CV: No cyanosis.   RESP: normal respiratory effort, on RA Neuro: II - XII grossly intact.     Data Reviewed: I have personally reviewed labs and imaging studies  Time spent: 35 minutes  Darlin Priestly, MD [facility] If 7PM-7AM, please contact night-coverage 11/15/2022, 5:58 PM

## 2022-11-15 NOTE — Progress Notes (Signed)
  Received patient in bed to unit.   Informed consent signed and in chart.    TX duration: 3.5hrs     Transported back  to floor Hand-off given to patient's nurse. No c/o and no distress noted    Access used: R HD Catheter Access issues: none   Total UF removed: 1.0L Medication(s) given: none Post HD VS: 141/76 Post HD weight: 56.9kg     Lynann Beaver  [facility]

## 2022-11-16 ENCOUNTER — Other Ambulatory Visit: Payer: Self-pay

## 2022-11-16 DIAGNOSIS — E8770 Fluid overload, unspecified: Secondary | ICD-10-CM | POA: Diagnosis not present

## 2022-11-16 LAB — GLUCOSE, CAPILLARY
Glucose-Capillary: 212 mg/dL — ABNORMAL HIGH (ref 70–99)
Glucose-Capillary: 199 mg/dL — ABNORMAL HIGH (ref 70–99)

## 2022-11-16 MED ORDER — CARVEDILOL 6.25 MG PO TABS: 3.1250 mg | ORAL_TABLET | Freq: Two times a day (BID) | ORAL | Status: DC

## 2022-11-16 MED ORDER — FIDAXOMICIN 200 MG PO TABS
200.0000 mg | ORAL_TABLET | Freq: Two times a day (BID) | ORAL | 0 refills | Status: AC
  Filled 2022-11-16: qty 10, 5d supply, fill #0

## 2022-11-16 MED ORDER — INSULIN GLARGINE-YFGN 100 UNIT/ML ~~LOC~~ SOLN: 8.0000 [IU] | Freq: Every day | SUBCUTANEOUS | Status: DC

## 2022-11-16 MED ORDER — NEPRO/CARBSTEADY PO LIQD: 237.0000 mL | Freq: Three times a day (TID) | ORAL | Status: DC

## 2022-11-16 NOTE — Progress Notes (Signed)
Shawn [name] to be D/C'd Home per MD order.  Discussed prescriptions and follow up appointments with the patient. Prescriptions given to patient, medication list explained in detail. Pt verbalized understanding.  Allergies as of 11/16/2022       Reactions   Empagliflozin    Other Reaction(s): Acidosis   Pravastatin    Other reaction(s): Muscle pain   Simvastatin    Other reaction(s): Muscle pain        Medication List     STOP taking these medications    erythromycin ophthalmic ointment   gentamicin 0.3 % ophthalmic solution Commonly known as: GARAMYCIN   oxyCODONE 5 MG immediate release tablet Commonly known as: Oxy IR/ROXICODONE       TAKE these medications    acetaminophen 325 MG tablet Commonly known as: TYLENOL Take 2 tablets (650 mg total) by mouth every 6 (six) hours as needed for mild pain (or Fever >/= 101).   ammonium lactate 12 % lotion Commonly known as: LAC-HYDRIN Apply 1 Application topically as needed for dry skin.   aspirin EC 81 MG tablet Take 81 mg by mouth daily.   atorvastatin 80 MG tablet Commonly known as: LIPITOR Take 40 mg by mouth at bedtime.   atropine 1 % ophthalmic solution Place 1 drop into the right eye 2 (two) times daily.   carvedilol 6.25 MG tablet Commonly known as: Coreg Take 0.5 tablets (3.125 mg total) by mouth 2 (two) times daily. Reduced from 6.25 mg. What changed:  how much to take additional instructions   cholecalciferol 25 MCG (1000 UNIT) tablet Commonly known as: VITAMIN D3 Take 2,000 Units by mouth daily.   dextrose 40 % Gel Commonly known as: GLUTOSE Take 1 Tube by mouth once as needed for low blood sugar.   Dificid 200 MG Tabs tablet Generic drug: fidaxomicin Take 1 tablet (200 mg total) by mouth 2 (two) times daily for 5 days.   feeding supplement (NEPRO CARB STEADY) Liqd Take 237 mLs by mouth 3 (three) times daily between meals.   hydrOXYzine 10 MG tablet Commonly known as: ATARAX Take  10 mg by mouth daily as needed for itching or anxiety.   insulin aspart 100 UNIT/ML FlexPen Commonly known as: NOVOLOG Inject 3 Units into the skin 3 (three) times daily with meals.   insulin glargine-yfgn 100 UNIT/ML injection Commonly known as: SEMGLEE Inject 0.08 mLs (8 Units total) into the skin daily. What changed:  how much to take when to take this   multivitamin with minerals Tabs tablet Take 1 tablet by mouth daily.   omeprazole 20 MG capsule Commonly known as: PRILOSEC Take 20 mg by mouth daily.   prednisoLONE acetate 1 % ophthalmic suspension Commonly known as: PRED FORTE Place 1 drop into the right eye as directed.   Synthroid 75 MCG tablet Generic drug: levothyroxine Take 75 mcg by mouth daily before breakfast.   tamsulosin 0.4 MG Caps capsule Commonly known as: FLOMAX Take 1 capsule (0.4 mg total) by mouth daily after supper.   torsemide 20 MG tablet Commonly known as: DEMADEX Take 60 mg by mouth as directed. Take 3 tablets (60mg ) by mouth every Sunday, Tuesday, Thursday and Saturday        Vitals:   11/16/22 0833 11/16/22 1459  BP: 117/74 120/82  Pulse: 64 74  Resp: 16 16  Temp: 98.4 F (36.9 C) 98 F (36.7 C)  SpO2: 100% 97%    Skin clean, dry and intact without evidence of skin break down,  no evidence of skin tears noted. IV catheter discontinued intact. Site without signs and symptoms of complications. Dressing and pressure applied. Pt denies pain at this time. No complaints noted.  An After Visit Summary was printed and given to the patient.  D/C home via EMS. Sister was called to give update that EMS had left with patient  Shawn [name]

## 2022-11-16 NOTE — TOC Transition Note (Signed)
Transition of Care [facility]) - CM/SW Discharge Note   Patient Details  Name: Shawn [name] MRN: [id number] Date of Birth: 1955-05-15  Transition of Care [facility]) CM/SW Contact:  Margarito Liner, LCSW Phone Number: 11/16/2022, 2:19 PM   Clinical Narrative:  Patient has orders to discharge home today. [facility] liaison is aware. EMS transport has been arranged. RN will call sister when they arrive. No further concerns. CSW signing off.  Final next level of care: [name] Barriers to Discharge: Barriers Resolved   Patient Goals and CMS Choice CMS Medicare.gov Compare Post Acute Care list provided to:: Patient Choice offered to / list presented to : Sibling  Discharge Placement                  Patient to be transferred to facility by: EMS Name of family member notified: [name] Agostini-Franks Patient and family notified of of transfer: 11/16/22  Discharge Plan and Services Additional resources added to the After Visit Summary for                            [facility] Arranged: RN, Nurse's Aide [facility] Agency: [facility] Date [facility] Agency Contacted: 11/16/22   Representative spoke with at [facility] Agency: Lorenza Chick  Social Determinants of Health (SDOH) Interventions SDOH Screenings   Food Insecurity: No Food Insecurity (11/10/2022)  Housing: Low Risk  (11/10/2022)  Transportation Needs: Unmet Transportation Needs (11/10/2022)  Utilities: Not At Risk (11/10/2022)  Financial Resource Strain: High Risk (08/24/2019)   Received from [facility], [facility]  Social Connections: Moderately Isolated (08/24/2019)   Received from [facility], [facility]  Stress: No Stress Concern Present (08/24/2019)   Received from [facility], [facility]  Tobacco Use: High Risk (11/10/2022)     Readmission Risk Interventions    09/04/2022   11:28 AM  06/24/2021    2:57 PM 01/08/2021    1:22 PM  Readmission Risk Prevention Plan  Transportation Screening Complete Complete Complete  PCP or Specialist Appt within 3-5 Days Complete    HRI or Home Care Consult Complete    Social Work Consult for Recovery Care Planning/Counseling Complete  Complete  Palliative Care Screening Not Applicable  Not Applicable  Medication Review Oceanographer) Complete Complete Complete  Palliative Care Screening  Not Applicable   Skilled Nursing Facility  Not Applicable

## 2022-11-16 NOTE — Progress Notes (Signed)
[facility]  ROUNDING NOTE   Subjective:   Shawn [name] is a 67 y.o. male with past medical conditions including end stage renal disease on hemodialysis, hypertension, diabetes mellitus type II, congestive heart failure, coronary artery disease, CVA, alcohol abuse, tobacco abuse and hypothyroidism. He presents to the ED with hypoglycemia. Patient has been admitted for Hypervolemia [E87.70] Hyperkalemia [E87.5] Hypoglycemia [E16.2] Other ascites [R18.8] Bilateral pleural effusion [J90] Nausea and vomiting in adult [R11.2]  Patient is known to our practice and receives outpatient dialysis treatments at [facility] on a MWF schedule, supervised by Dr Thedore Mins.    Update: Patient sitting up in bed Partially completed breakfast at bedside Possible discharge today   Objective:  Vital signs in last 24 hours:  Temp:  [98.1 F (36.7 C)-99.2 F (37.3 C)] 98.4 F (36.9 C) (11/12 0833) Pulse Rate:  [64-85] 64 (11/12 0833) Resp:  [14-18] 16 (11/12 0833) BP: (105-141)/(64-82) 117/74 (11/12 0833) SpO2:  [97 %-100 %] 100 % (11/12 0833) Weight:  [56.9 kg-129.3 kg] 129.3 kg (11/12 0333)  Weight change: 0.782 kg Filed Weights   11/15/22 0757 11/15/22 1150 11/16/22 0333  Weight: 57.3 kg 56.9 kg 129.3 kg    Intake/Output: I/O last 3 completed shifts: In: -  Out: 1000 [Other:1000]   Intake/Output this shift:  No intake/output data recorded.  Physical Exam: General: NAD  Head: Normocephalic, atraumatic. Moist oral mucosal membranes  Eyes: Anicteric  Lungs:  Clear to auscultation, room air  Heart: Regular rate and rhythm  Abdomen:  Soft, nontender, mildly distended  Extremities: 1+ left peripheral edema.  Neurologic: Alert, able to answer questions appropriately  Skin: No lesions  Access: Right PermCath    Basic Metabolic Panel: Recent Labs  Lab 11/10/22 0325 11/11/22 0434 11/12/22 0403 11/15/22 0750  NA 130* 133* 133* 128*  K 5.6* 4.0 4.6 5.3*   CL 97* 96* 97* 93*  CO2 22 26 24  21*  GLUCOSE 136* 148* 154* 493*  BUN 108* 60* 79* 91*  CREATININE 8.01* 4.83* 5.84* 6.13*  CALCIUM 8.2* 8.1* 8.2* 8.4*  MG  --   --  1.7  --   PHOS  --   --  4.8* 4.8*    Liver Function Tests: Recent Labs  Lab 11/10/22 0325 11/11/22 0434 11/15/22 0750  AST 29 18  --   ALT 19 14  --   ALKPHOS 98 86  --   BILITOT 0.8 0.8  --   PROT 6.5 6.4*  --   ALBUMIN 2.7* 2.8* 2.7*   Recent Labs  Lab 11/10/22 0325  LIPASE 21   No results for input(s): "AMMONIA" in the last 168 hours.  CBC: Recent Labs  Lab 11/10/22 0325 11/11/22 0434 11/12/22 0403 11/15/22 0750  WBC 4.0 3.2* 3.3* 5.0  NEUTROABS 2.9  --   --   --   HGB 9.6* 9.5* 9.7* 9.2*  HCT 29.6* 28.2* 29.3* 27.9*  MCV 94.0 89.2 91.3 90.6  PLT 275 241 255 193    Cardiac Enzymes: No results for input(s): "CKTOTAL", "CKMB", "CKMBINDEX", "TROPONINI" in the last 168 hours.  BNP: Invalid input(s): "POCBNP"  CBG: Recent Labs  Lab 11/14/22 2018 11/15/22 1233 11/15/22 1710 11/15/22 2116 11/16/22 0831  GLUCAP 202* 371* 377* 279* 212*    Microbiology: Results for orders placed or performed during the hospital encounter of 11/10/22  Body fluid culture w Gram Stain     Status: None   Collection Time: 11/10/22  4:24 PM   Specimen: PATH  Cytology Peritoneal fluid  Result Value Ref Range Status   Specimen Description   Final    PERITONEAL Performed at [facility], [address] [place], [place] [address]    Special Requests   Final    PERITONEAL Performed at [facility], [address] [place], [place] [address]    Gram Stain   Final    WBC PRESENT, PREDOMINANTLY MONONUCLEAR NO ORGANISMS SEEN CYTOSPIN SMEAR    Culture   Final    NO GROWTH 3 DAYS Performed at [facility], [address] [place], [place] [address]    Report Status 11/14/2022 FINAL  Final  Gastrointestinal Panel by PCR , Stool     Status: None   Collection Time: 11/11/22  8:32  AM   Specimen: STOOL  Result Value Ref Range Status   Campylobacter species NOT DETECTED NOT DETECTED Final   Plesimonas shigelloides NOT DETECTED NOT DETECTED Final   Salmonella species NOT DETECTED NOT DETECTED Final   Yersinia enterocolitica NOT DETECTED NOT DETECTED Final   Vibrio species NOT DETECTED NOT DETECTED Final   Vibrio cholerae NOT DETECTED NOT DETECTED Final   Enteroaggregative E coli (EAEC) NOT DETECTED NOT DETECTED Final   Enteropathogenic E coli (EPEC) NOT DETECTED NOT DETECTED Final   Enterotoxigenic E coli (ETEC) NOT DETECTED NOT DETECTED Final   Shiga like toxin producing E coli (STEC) NOT DETECTED NOT DETECTED Final   Shigella/Enteroinvasive E coli (EIEC) NOT DETECTED NOT DETECTED Final   Cryptosporidium NOT DETECTED NOT DETECTED Final   Cyclospora cayetanensis NOT DETECTED NOT DETECTED Final   Entamoeba histolytica NOT DETECTED NOT DETECTED Final   Giardia lamblia NOT DETECTED NOT DETECTED Final   Adenovirus F40/41 NOT DETECTED NOT DETECTED Final   Astrovirus NOT DETECTED NOT DETECTED Final   Norovirus GI/GII NOT DETECTED NOT DETECTED Final   Rotavirus A NOT DETECTED NOT DETECTED Final   Sapovirus (I, II, IV, and V) NOT DETECTED NOT DETECTED Final    Comment: Performed at [facility], [address] [place], [place] [address]  C Difficile Quick Screen w PCR reflex     Status: Abnormal   Collection Time: 11/11/22  8:35 AM   Specimen: STOOL  Result Value Ref Range Status   C Diff antigen POSITIVE (A) NEGATIVE Final   C Diff toxin NEGATIVE NEGATIVE Final   C Diff interpretation Results are indeterminate. See PCR results.  Final    Comment: Performed at [facility], [address] [place], [place] [address]  C. Diff by PCR, Reflexed     Status: Abnormal   Collection Time: 11/11/22  8:35 AM  Result Value Ref Range Status   Toxigenic C. Difficile by PCR POSITIVE (A) NEGATIVE Final    Comment: Positive for toxigenic C. difficile with little  to no toxin production. Only treat if clinical presentation suggests symptomatic illness. Performed at [facility], [address] [place], [place] [address]     Coagulation Studies: No results for input(s): "LABPROT", "INR" in the last 72 hours.  Urinalysis: No results for input(s): "COLORURINE", "LABSPEC", "PHURINE", "GLUCOSEU", "HGBUR", "BILIRUBINUR", "KETONESUR", "PROTEINUR", "UROBILINOGEN", "NITRITE", "LEUKOCYTESUR" in the last 72 hours.  Invalid input(s): "APPERANCEUR"    Imaging: No results found.   Medications:       Chlorhexidine Gluconate Cloth  6 each Topical Q0600   feeding supplement (NEPRO CARB STEADY)  237 mL Oral TID BM   fidaxomicin  200 mg Oral BID   heparin  5,000 Units  Subcutaneous Q8H   insulin aspart  0-5 Units Subcutaneous QHS   insulin aspart  0-6 Units Subcutaneous TID WC   insulin aspart  3 Units Subcutaneous TID WC   insulin glargine-yfgn  5 Units Subcutaneous Daily   multivitamin  1 tablet Oral QHS   sodium chloride flush  3 mL Intravenous Q12H   acetaminophen **OR** acetaminophen, ondansetron **OR** ondansetron (ZOFRAN) IV, sodium chloride flush  Assessment/ Plan:  Mr. KEILYN DIBLASIO is a 67 y.o.  male with end stage renal disease on hemodialysis, hypertension, diabetes mellitus type II, congestive heart failure, coronary artery disease, CVA, alcohol abuse, tobacco abuse and hypothyroidism   Outpatient dialysis-[facility]/195 minutes / 65 kg/right IJ PermCath/MWF  End-stage renal disease with hyperkalemia on hemodialysis.  Dialysis received yesterday, UF 1L achieved. Next treatment scheduled for Wednesday.  2. Fluid volume overload, Moderate to large ascites seen on CT abd and pelvis.  2 L fluid removed with paracentesis on 11/10/2022.  Volume status acceptable.   3.  Hypoglycemia, 30s on EMS arrival.  Glucose remains elevated at times.  Glucose elevated but better controlled. Glucose elevated. Sliding scale  managed by primary team.  4. Anemia of chronic kidney disease Lab Results  Component Value Date   HGB 9.2 (L) 11/15/2022    Patient receives Mircera at outpatient clinic. Hgb at goal. May require low dose EPO.   5.  Hypertension with chronic kidney disease.  Home regimen includes carvedilol and furosemide.  All medications held and blood pressure stable   LOS: 6 Kamir Selover 11/12/202411:20 AM

## 2022-11-16 NOTE — Inpatient Diabetes Management (Signed)
Inpatient Diabetes Program Recommendations  AACE/ADA: New Consensus Statement on Inpatient Glycemic Control (2015)  Target Ranges:  Prepandial:   less than 140 mg/dL      Peak postprandial:   less than 180 mg/dL (1-2 hours)      Critically ill patients:  140 - 180 mg/dL    Latest Reference Range & Units 11/15/22 12:33 11/15/22 17:10 11/15/22 21:16  Glucose-Capillary 70 - 99 mg/dL 829 (H) 562 (H) 130 (H)  (H): Data is abnormally high  Latest Reference Range & Units 11/16/22 08:31 11/16/22 12:42  Glucose-Capillary 70 - 99 mg/dL 865 (H) 784 (H)  (H): Data is abnormally high   Home DM Meds: Novolog 3 units TID with meals       Semglee 5 units daily  Current Orders: Semglee 5 units daily      Novolog 0-6 units TID AC + HS      Novolog 3 units TID with meals    Received Secure Chat from Dr. Fran Lowes regarding this pt.  Dr. Fran Lowes would like for me to review discharge insulin regimen with pt prior to going home today.  Plan is for: Continue Novolog 3 units TID with meals and Increase Semglee to 8 units Daily.  Met w/ pt at bedside.  Pt told me he has his insulin at home.  Told me he has been taking Novolog 3-8 units TID with meals and Semglee 3 units daily.  Discussed with Pt that Dr. Fran Lowes has adjusted his insulin to the following:  Novolog 3 units TID with meals Semglee 8 units daily Instructed pt not to take more than 3 units Novolog when he eats and to not take the Novolog if he is unable to eat Instructed pt to Increase the Semglee to 8 units daily.  Reminded pt to  take the [facility] everyday regardless of food intake.   Pt told me he usually takes his insulin independently, however, sometimes his sister will help administer.  Encouraged pt to share his d/c instructions with his sister so she can see the insulin changes.    --Will follow patient during hospitalization--  Ambrose Finland RN, MSN, CDCES Diabetes Coordinator Inpatient Glycemic Control Team Team Pager: [phone]  (8a-5p)

## 2022-11-16 NOTE — Discharge Instructions (Signed)
Transportation Resources  Agency Name: [facility] Agency Address: [address] [place], [place] [address] Phone: [phone] Email: [email or web address] Website: [email or web address] Service(s) Offered: Housing services, self-sufficiency, congregate meal program, weatherization program, Field seismologist program, emergency food assistance,  housing counseling, home ownership program, wheels-towork program.  Agency Name: [facility] [employer] [phone]) Address: [address] [place], [place] [address] Phone: [phone] Website: [email or web address] Service(s) Offered: Transportation for [employer], subscription and demand response; Dial-a-Ride for citizens 10 years of age or older.  Agency Name: [facility] Address: [address] [place], [place] [address] Phone: [phone] Service(s) Offered: Child support services; child welfare services; food stamps; Medicaid; work first family assistance; and aid with fuel,  rent, food and medicine, transportation assistance.  Agency Name: [employer]abled [employer] (DAV) Transportation  Network Phone: [phone] Service(s) Offered: Transports veterans to the [facility] medical center. Call  forty-eight hours in advance and leave the name, telephone  number, date, and time of appointment. Veteran will be  contacted by the driver the day before the appointment to  arrange a pick up point   Transportation Resources  Agency Name: [facility] Agency Address: [address] [place], [place] [address] Phone: [phone] Email: [email or web address] Website: [email or web address] Service(s) Offered: Housing services, self-sufficiency, congregate meal program, weatherization program, Field seismologist program, emergency food assistance,  housing counseling, home ownership program, wheels-towork  program.  Agency Name: [facility] [employer] [phone]) Address: [address], [place], [place] [address] Phone: [phone] Website: [email or web address] Service(s) Offered: Transportation for [employer], subscription and demand response; Dial-a-Ride for citizens 40 years of age or older.  Agency Name: Department of Social Services Address: [address] [place], [place] [address] Phone: [phone] Service(s) Offered: Child support services; child welfare services; food stamps; Medicaid; work first family assistance; and aid with fuel,  rent, food and medicine, transportation assistance.  Agency Name: [employer]abled [employer] (DAV) Transportation  Network Phone: [phone] Service(s) Offered: Transports veterans to the [facility] medical center. Call  forty-eight hours in advance and leave the name, telephone  number, date, and time of appointment. Veteran will be  contacted by the driver the day before the appointment to  arrange a pick up point    [employer] currently provides door to door services. ACTA connects with PART daily for services to [facility]. ACTA also performs contract services to [employer] operates 27 vehicles, all but 3 mini-vans are equipped with lifts for special needs as well as the general public. ACTA drivers are each CDL certified and trained in First Aid and CPR. ACTA was established in 2002 by [employer]. An independent Industrial/product designer. ACTA operates via Cytogeneticist with required Research scientist (physical sciences) from [place]. ACT[employer] provides over 80,000 passenger trips each year, including [facility] and [employer] sites.  Call at least by 11 AM one business day prior to needing transportation  [employer].                      [place], [place] [address]     Office  Hours: Monday-Friday  8 AM - 5 PM

## 2022-11-16 NOTE — Discharge Summary (Signed)
Physician Discharge Summary   JUJHAR HAMERNIK  male DOB: [birth year]  ZOX:[id number]  [facility]  Admit date: 11/10/2022 Discharge date: 11/16/2022  Admitted From: home Disposition:  home Home Health: Yes CODE STATUS: Full code  Discharge Instructions     Diet - low sodium heart healthy   Complete by: As directed    Diet Carb Modified   Complete by: As directed    Discharge instructions   Complete by: As directed    Please finish 5 more days of Dificid for your C diff infection.  Please taking your long-acting insulin 8 units daily.  Take short-acting insulin 3 units only when you eat a meal.  Do not do sliding scale insulin. [facility] Course:  For full details, please see H&P, progress notes, consult notes and ancillary notes.  Briefly,  Shawn [name] is a 67 y.o. male with medical history significant of HFrEF, ESRD on hemodialysis MWF, substance abuse, history of CVA presenting with hypoglycemia, hypothermia, ascites, diarrhea.    Patient reported taking his insulin the day PTA.  Patient unclear if he increased the dose.     * Hypervolemia ESRD on dialysis ([name]) on MWF Noted baseline ESRD, HFrEF with general volume overload on presentation.   Moderate to large volume ascites on CT scan --s/p paracentesis on 11/6 with 2L removed --iHD per nephro for volume removal.   Hypoglycemia Blood sugar in the 60s on presentation Noted baseline noncompliance as well as admission for DKA October 2024. Some concern for labile blood sugar versus brittle diabetes.  Pt also seemed not clear about insulin dose --hypoglycemia resolved with D50.   Diarrhea 2/2 C diff infection Patient reports multiple rounds of loose diarrhea over multiple days CT abdomen pelvis with no overt signs of colitis at present White count within normal limits Minimal to mild abdominal pain on presentation --C diff antigen pos, toxin neg, PCR pos.  Started on oral  vanc, switched to dificid on 11/10 for easier administration after discharge. --Pt discharged on 5 more day of Dificid to finish a 10-day course.  Dificid provided to pt prior to discharge.  Hypothermia T 95.1 on presentation.  Resolved   Essential hypertension BP stable and wnl without antihypertensives during hospitalization. --home coreg to be resumed after discharge, reduced from 6.25 to 3.125 mg BID due to intermittent low normal BP.   Iron deficiency anemia Hgb 9's At baseline    Smoker 1/2 pack/day smoker Discussed cessation at length by admitting physician   Alcohol abuse Patient denies any active alcohol use   HLD (hyperlipidemia) --resume statin after discharge   Chronic HFrEF (heart failure with reduced ejection fraction) ([name]) 2D echo June 2023 with a EF of 20 to 25% and grade 2 diastolic dysfunction Positive volume overload in setting of baseline ESRD-dialysis dependent volume status Ascites also confounding issue. --home coreg reduced from 6.25 to 3.125 mg BID due to intermittent low normal BP.   Hyperkalemia K5.6 on presentation.  Status post Lokelma in the ER --corrected with HD   DM --Recent A1c 11, poorly controlled.  Based on insulin usage during hospitalization, pt was discharged on glargine 8u daily, and mealtime 3u TID.  Pt was advised not to give himself SSI.  Diabetic coordinator did teaching with pt prior to discharge.   Severe malnutrition in context of chronic illness  --supplements per dietician    Hyponatremia, chronic --Na stable in low 130's   Discharge Diagnoses:  Principal Problem:   Hypervolemia Active Problems:   Hypoglycemia   Hypothermia   ESRD on dialysis [facility])   Essential hypertension   Diarrhea   Iron deficiency anemia   Smoker   Alcohol abuse   HLD (hyperlipidemia)   Hyperkalemia   HFrEF (heart failure with reduced ejection fraction) ([name])   Palliative care encounter   30 Day Unplanned Readmission Risk Score     Flowsheet Row ED to Hosp-Admission (Current) from 11/10/2022 in [facility]  30 Day Unplanned Readmission Risk Score (%) 39.25 Filed at 11/16/2022 1200       This score is the patient's risk of an unplanned readmission within 30 days of being discharged (0 -100%). The score is based on dignosis, age, lab data, medications, orders, and past utilization.   Low:  0-14.9   Medium: 15-21.9   High: 22-29.9   Extreme: 30 and above         Discharge Instructions:  Allergies as of 11/16/2022       Reactions   Empagliflozin    Other Reaction(s): Acidosis   Pravastatin    Other reaction(s): Muscle pain   Simvastatin    Other reaction(s): Muscle pain        Medication List     STOP taking these medications    erythromycin ophthalmic ointment   gentamicin 0.3 % ophthalmic solution Commonly known as: GARAMYCIN   oxyCODONE 5 MG immediate release tablet Commonly known as: Oxy IR/ROXICODONE       TAKE these medications    acetaminophen 325 MG tablet Commonly known as: TYLENOL Take 2 tablets (650 mg total) by mouth every 6 (six) hours as needed for mild pain (or Fever >/= 101).   ammonium lactate 12 % lotion Commonly known as: LAC-HYDRIN Apply 1 Application topically as needed for dry skin.   aspirin EC 81 MG tablet Take 81 mg by mouth daily.   atorvastatin 80 MG tablet Commonly known as: LIPITOR Take 40 mg by mouth at bedtime.   atropine 1 % ophthalmic solution Place 1 drop into the right eye 2 (two) times daily.   carvedilol 6.25 MG tablet Commonly known as: Coreg Take 0.5 tablets (3.125 mg total) by mouth 2 (two) times daily. Reduced from 6.25 mg. What changed:  how much to take additional instructions   cholecalciferol 25 MCG (1000 UNIT) tablet Commonly known as: VITAMIN D3 Take 2,000 Units by mouth daily.   dextrose 40 % Gel Commonly known as: GLUTOSE Take 1 Tube by mouth once as needed for low blood sugar.    feeding supplement (NEPRO CARB STEADY) Liqd Take 237 mLs by mouth 3 (three) times daily between meals.   fidaxomicin 200 MG Tabs tablet Commonly known as: DIFICID Take 1 tablet (200 mg total) by mouth 2 (two) times daily for 5 days.   hydrOXYzine 10 MG tablet Commonly known as: ATARAX Take 10 mg by mouth daily as needed for itching or anxiety.   insulin aspart 100 UNIT/ML FlexPen Commonly known as: NOVOLOG Inject 3 Units into the skin 3 (three) times daily with meals.   insulin glargine-yfgn 100 UNIT/ML injection Commonly known as: SEMGLEE Inject 0.08 mLs (8 Units total) into the skin daily. What changed:  how much to take when to take this   multivitamin with minerals Tabs tablet Take 1 tablet by mouth daily.   omeprazole 20 MG capsule Commonly known as: PRILOSEC Take 20 mg by mouth daily.   prednisoLONE acetate 1 % ophthalmic suspension  Commonly known as: PRED FORTE Place 1 drop into the right eye as directed.   Synthroid 75 MCG tablet Generic drug: levothyroxine Take 75 mcg by mouth daily before breakfast.   tamsulosin 0.4 MG Caps capsule Commonly known as: FLOMAX Take 1 capsule (0.4 mg total) by mouth daily after supper.   torsemide 20 MG tablet Commonly known as: DEMADEX Take 60 mg by mouth as directed. Take 3 tablets (60mg ) by mouth every Sunday, Tuesday, Thursday and Saturday         Follow-up Information     [facility] Follow up in 1 week(s).   Specialty: General Practice Contact information: [address] [place] [address] [phone]                 Allergies  Allergen Reactions   Empagliflozin     Other Reaction(s): Acidosis   Pravastatin     Other reaction(s): Muscle pain   Simvastatin     Other reaction(s): Muscle pain     The results of significant diagnostics from this hospitalization (including imaging, microbiology, ancillary and laboratory) are listed below for reference.    Consultations:   Procedures/Studies: US Paracentesis  Result Date: 11/11/2022 INDICATION: History of CHF with fluid overload and ascites. Request for diagnostic and therapeutic paracentesis with 2 L maximum. Last US paracentesis with 1.5 L removed. EXAM: ULTRASOUND GUIDED DIAGNOSTIC and THERAPEUTIC PARACENTESIS MEDICATIONS: 10 cc 1% lidocaine COMPLICATIONS: None immediate. PROCEDURE: Informed written consent was obtained from the patient after a discussion of the risks, benefits and alternatives to treatment. A timeout was performed prior to the initiation of the procedure. Initial ultrasound scanning demonstrates a large amount of ascites within the right upper abdominal quadrant. The right upper abdomen was prepped and draped in the usual sterile fashion. 1% lidocaine was used for local anesthesia. Following this, a 19 gauge, 7-cm, Yueh catheter was introduced. An ultrasound image was saved for documentation purposes. The paracentesis was performed. The catheter was removed and a dressing was applied. The patient tolerated the procedure well without immediate post procedural complication. FINDINGS: A total of approximately 2 L of serous ascitic fluid was removed. Samples were sent to the laboratory as requested by the clinical team. IMPRESSION: Successful ultrasound-guided diagnostic and therapeutic paracentesis yielding 2 liters of peritoneal fluid. Procedure performed by Mina Marble, PA-C Electronically Signed   By: Roanna Banning M.D.   On: 11/11/2022 07:00   CT ABDOMEN PELVIS WO CONTRAST  Result Date: 11/10/2022 CLINICAL DATA:  67 year old male with abdominal pain and hypoglycemia. History of ascites, paracentesis last month. EXAM: CT ABDOMEN AND PELVIS WITHOUT CONTRAST TECHNIQUE: Multidetector CT imaging of the abdomen and pelvis was performed following the standard protocol without IV contrast. RADIATION DOSE REDUCTION: This exam was performed according to the departmental dose-optimization  program which includes automated exposure control, adjustment of the mA and/or kV according to patient size and/or use of iterative reconstruction technique. COMPARISON:  Noncontrast CT Abdomen and Pelvis 08/28/2022. FINDINGS: Lower chest: Increased layering bilateral pleural effusions since August with simple fluid density. Moderate volume pleural fluid now with compressive lung base atelectasis. Underlying cardiomegaly not significantly changed. Dual lumen dialysis type vascular catheter visible at the cavoatrial junction. No pericardial effusion. Hepatobiliary: Moderate to large volume ascites with fairly simple fluid density. Fluid volume increased compared to 08/28/2022. Underlying volume of the noncontrast liver seems diminished, although the liver contour is not obviously nodular. Unremarkable gallbladder. Pancreas: Limited detail, no abnormality evident. Spleen: Diminutive and unremarkable series 2, image  28. Adrenals/Urinary Tract: Limited detail. Noncontrast adrenal glands, kidneys appear symmetric and unremarkable. Small volume of fluid within the urinary bladder which is mostly displaced toward the anterior lower abdominal wall. See sagittal image 55. Stomach/Bowel: Fluid-filled stomach, but no other dilated bowel loops in the abdomen or pelvis. Limited bowel detail due to abundant ascites and lack of contrast. However, there is evidence of an air-filled retrocecal appendix located along the right gutter series 2, image 53. No pneumoperitoneum. Vascular/Lymphatic: Severe Aortoiliac calcified atherosclerosis. Normal caliber abdominal aorta. Vascular patency is not evaluated in the absence of IV contrast. No lymphadenopathy is evident. Reproductive: Ascites tracking in both inguinal canals. Other: Moderate to large volume ascites in the pelvis. Simple fluid density. Musculoskeletal: Stable.  No acute osseous abnormality identified. IMPRESSION: 1. Moderate to large volume Ascites, increased compared to  August CT. Also increased and now moderate bilateral layering pleural effusions with lung base atelectasis. 2. Fluid distended stomach, but other bowel loops are decompressed throughout the abdomen and pelvis. NG tube decompression might be valuable. 3. Cardiomegaly.  Advanced Aortic Atherosclerosis (ICD10-I70.0). 4. No other acute or inflammatory process evident on this noncontrast exam. Electronically Signed   By: Odessa Fleming M.D.   On: 11/10/2022 04:31   DG Chest Portable 1 View  Result Date: 10/27/2022 CLINICAL DATA:  Nausea/vomiting/abdominal pain EXAM: PORTABLE CHEST - 1 VIEW COMPARISON:  09/03/2022 FINDINGS: Stable right IJ hemodialysis catheter to the proximal right atrium. Resolution of the interstitial edema seen previously. Left retrocardiac consolidation/atelectasis. Stable borderline cardiomegaly. No effusion. Visualized bones unremarkable. IMPRESSION: Left retrocardiac consolidation/atelectasis. Electronically Signed   By: Corlis Leak M.D.   On: 10/27/2022 16:51      Labs: BNP (last 3 results) Recent Labs    09/03/22 0422  BNP >4,500.0*   Basic Metabolic Panel: Recent Labs  Lab 11/10/22 0325 11/11/22 0434 11/12/22 0403 11/15/22 0750  NA 130* 133* 133* 128*  K 5.6* 4.0 4.6 5.3*  CL 97* 96* 97* 93*  CO2 22 26 24  21*  GLUCOSE 136* 148* 154* 493*  BUN 108* 60* 79* 91*  CREATININE 8.01* 4.83* 5.84* 6.13*  CALCIUM 8.2* 8.1* 8.2* 8.4*  MG  --   --  1.7  --   PHOS  --   --  4.8* 4.8*   Liver Function Tests: Recent Labs  Lab 11/10/22 0325 11/11/22 0434 11/15/22 0750  AST 29 18  --   ALT 19 14  --   ALKPHOS 98 86  --   BILITOT 0.8 0.8  --   PROT 6.5 6.4*  --   ALBUMIN 2.7* 2.8* 2.7*   Recent Labs  Lab 11/10/22 0325  LIPASE 21   No results for input(s): "AMMONIA" in the last 168 hours. CBC: Recent Labs  Lab 11/10/22 0325 11/11/22 0434 11/12/22 0403 11/15/22 0750  WBC 4.0 3.2* 3.3* 5.0  NEUTROABS 2.9  --   --   --   HGB 9.6* 9.5* 9.7* 9.2*  HCT 29.6* 28.2*  29.3* 27.9*  MCV 94.0 89.2 91.3 90.6  PLT 275 241 255 193   Cardiac Enzymes: No results for input(s): "CKTOTAL", "CKMB", "CKMBINDEX", "TROPONINI" in the last 168 hours. BNP: Invalid input(s): "POCBNP" CBG: Recent Labs  Lab 11/15/22 1233 11/15/22 1710 11/15/22 2116 11/16/22 0831 11/16/22 1242  GLUCAP 371* 377* 279* 212* 199*   D-Dimer No results for input(s): "DDIMER" in the last 72 hours. Hgb A1c No results for input(s): "HGBA1C" in the last 72 hours. Lipid Profile No results for input(s): "CHOL", "HDL", "  LDLCALC", "TRIG", "CHOLHDL", "LDLDIRECT" in the last 72 hours. Thyroid function studies No results for input(s): "TSH", "T4TOTAL", "T3FREE", "THYROIDAB" in the last 72 hours.  Invalid input(s): "FREET3" Anemia work up No results for input(s): "VITAMINB12", "FOLATE", "FERRITIN", "TIBC", "IRON", "RETICCTPCT" in the last 72 hours. Urinalysis    Component Value Date/Time   COLORURINE YELLOW (A) 09/03/2022 0543   APPEARANCEUR CLOUDY (A) 09/03/2022 0543   LABSPEC 1.019 09/03/2022 0543   PHURINE 5.0 09/03/2022 0543   GLUCOSEU 150 (A) 09/03/2022 0543   HGBUR LARGE (A) 09/03/2022 0543   BILIRUBINUR NEGATIVE 09/03/2022 0543   KETONESUR NEGATIVE 09/03/2022 0543   PROTEINUR >=300 (A) 09/03/2022 0543   NITRITE NEGATIVE 09/03/2022 0543   LEUKOCYTESUR MODERATE (A) 09/03/2022 0543   Sepsis Labs Recent Labs  Lab 11/10/22 0325 11/11/22 0434 11/12/22 0403 11/15/22 0750  WBC 4.0 3.2* 3.3* 5.0   Microbiology Recent Results (from the past 240 hour(s))  Body fluid culture w Gram Stain     Status: None   Collection Time: 11/10/22  4:24 PM   Specimen: PATH Cytology Peritoneal fluid  Result Value Ref Range Status   Specimen Description   Final    PERITONEAL Performed at [facility], [address] [place], [place] [address]    Special Requests   Final    PERITONEAL Performed at [facility], [address] [place], [place] [address]    Gram Stain    Final    WBC PRESENT, PREDOMINANTLY MONONUCLEAR NO ORGANISMS SEEN CYTOSPIN SMEAR    Culture   Final    NO GROWTH 3 DAYS Performed at [facility], [address] [place], [place] [address]    Report Status 11/14/2022 FINAL  Final  Gastrointestinal Panel by PCR , Stool     Status: None   Collection Time: 11/11/22  8:32 AM   Specimen: STOOL  Result Value Ref Range Status   Campylobacter species NOT DETECTED NOT DETECTED Final   Plesimonas shigelloides NOT DETECTED NOT DETECTED Final   Salmonella species NOT DETECTED NOT DETECTED Final   Yersinia enterocolitica NOT DETECTED NOT DETECTED Final   Vibrio species NOT DETECTED NOT DETECTED Final   Vibrio cholerae NOT DETECTED NOT DETECTED Final   Enteroaggregative E coli (EAEC) NOT DETECTED NOT DETECTED Final   Enteropathogenic E coli (EPEC) NOT DETECTED NOT DETECTED Final   Enterotoxigenic E coli (ETEC) NOT DETECTED NOT DETECTED Final   Shiga like toxin producing E coli (STEC) NOT DETECTED NOT DETECTED Final   Shigella/Enteroinvasive E coli (EIEC) NOT DETECTED NOT DETECTED Final   Cryptosporidium NOT DETECTED NOT DETECTED Final   Cyclospora cayetanensis NOT DETECTED NOT DETECTED Final   Entamoeba histolytica NOT DETECTED NOT DETECTED Final   Giardia lamblia NOT DETECTED NOT DETECTED Final   Adenovirus F40/41 NOT DETECTED NOT DETECTED Final   Astrovirus NOT DETECTED NOT DETECTED Final   Norovirus GI/GII NOT DETECTED NOT DETECTED Final   Rotavirus A NOT DETECTED NOT DETECTED Final   Sapovirus (I, II, IV, and V) NOT DETECTED NOT DETECTED Final    Comment: Performed at [facility], [address] [place], [place] [address]  C Difficile Quick Screen w PCR reflex     Status: Abnormal   Collection Time: 11/11/22  8:35 AM   Specimen: STOOL  Result Value Ref Range Status   C Diff antigen POSITIVE (A) NEGATIVE Final   C Diff toxin NEGATIVE NEGATIVE Final   C Diff interpretation Results are indeterminate. See PCR results.  Final    Comment: Performed at [facility], [address] [place], [place] [address]  C. Diff by PCR, Reflexed     Status: Abnormal   Collection Time: 11/11/22  8:35 AM  Result Value Ref Range Status   Toxigenic C. Difficile by PCR POSITIVE (A) NEGATIVE Final    Comment: Positive for toxigenic C. difficile with little to no toxin production. Only treat if clinical presentation suggests symptomatic illness. Performed at [facility], [address] [place], [place] [address]      Total time spend on discharging this patient, including the last patient exam, discussing the hospital stay, instructions for ongoing care as it relates to all pertinent caregivers, as well as preparing the medical discharge records, prescriptions, and/or referrals as applicable, is 40 minutes.    Darlin Priestly, MD  [facility] 11/16/2022, 1:17 PM

## 2022-11-16 NOTE — TOC Progression Note (Addendum)
Transition of Care [facility]) - Progression Note    Patient Details  Name: Shawn [name] MRN: [id number] Date of Birth: [birth year]  Transition of Care [facility]) CM/SW Contact  Margarito Liner, LCSW Phone Number: 11/16/2022, 10:11 AM  Clinical Narrative:   Faxed VA home health authorization form and clinicals to [facility].  11:52 am: [facility] hospital liaison confirmed that she received the authorization request. She stated it will take 3-5 days to process. Frances Furbish will not be able to start services until they receive the authorization. Patient has an appointment on 11/14 at 11:30 with his new PCP at the [facility], Dr. Toya Smothers. CSW has updated sister regarding all of this information. She will ask the [facility] to evaluate him for personal care services. Sister said patient has Medicaid and he is also on the wait list for CAP services. No Medicaid listed in chart. CSW Patent examiner and asked her to verify and add to his chart. Sister is agreeable to discharge today. Will set up EMS transport for around 2:30 per her request and ask RN to call her when they arrive.  1:10 pm: Per Artist, patient has [employer].  Expected Discharge Plan: [name] Barriers to Discharge: Continued Medical Work up  Expected Discharge Plan and Services       Living arrangements for the past 2 months: Single Family Home                                       Social Determinants of Health (SDOH) Interventions SDOH Screenings   Food Insecurity: No Food Insecurity (11/10/2022)  Housing: Low Risk  (11/10/2022)  Transportation Needs: Unmet Transportation Needs (11/10/2022)  Utilities: Not At Risk (11/10/2022)  Financial Resource Strain: High Risk (08/24/2019)   Received from [facility], [facility]  Social Connections: Moderately Isolated (08/24/2019)   Received from [facility], [facility]  Stress: No Stress Concern Present (08/24/2019)   Received from [facility], [facility]  Tobacco Use: High Risk (11/10/2022)    Readmission Risk Interventions    09/04/2022   11:28 AM 06/24/2021    2:57 PM 01/08/2021    1:22 PM  Readmission Risk Prevention Plan  Transportation Screening Complete Complete Complete  PCP or Specialist Appt within 3-5 Days Complete    HRI or Home Care Consult Complete    Social Work Consult for Recovery Care Planning/Counseling Complete  Complete  Palliative Care Screening Not Applicable  Not Applicable  Medication Review Oceanographer) Complete Complete Complete  Palliative Care Screening  Not Applicable   Skilled Nursing Facility  Not Applicable

## 2023-01-08 ENCOUNTER — Encounter: Payer: Self-pay | Admitting: Nephrology

## 2023-01-08 DIAGNOSIS — R197 Diarrhea, unspecified: Secondary | ICD-10-CM

## 2023-02-19 ENCOUNTER — Inpatient Hospital Stay
Admission: EM | Admit: 2023-02-19 | Discharge: 2023-02-22 | DRG: 377 | Disposition: A | Discharge status: Home Health | Payer: No Typology Code available for payment source | Attending: Internal Medicine | Admitting: Internal Medicine

## 2023-02-19 ENCOUNTER — Other Ambulatory Visit: Payer: Self-pay

## 2023-02-19 ENCOUNTER — Emergency Department: Payer: No Typology Code available for payment source

## 2023-02-19 DIAGNOSIS — E11649 Type 2 diabetes mellitus with hypoglycemia without coma: Secondary | ICD-10-CM | POA: Diagnosis present

## 2023-02-19 DIAGNOSIS — N186 End stage renal disease: Secondary | ICD-10-CM | POA: Diagnosis not present

## 2023-02-19 DIAGNOSIS — Z992 Dependence on renal dialysis: Secondary | ICD-10-CM

## 2023-02-19 DIAGNOSIS — Z833 Family history of diabetes mellitus: Secondary | ICD-10-CM

## 2023-02-19 DIAGNOSIS — Z7982 Long term (current) use of aspirin: Secondary | ICD-10-CM

## 2023-02-19 DIAGNOSIS — Z79890 Hormone replacement therapy (postmenopausal): Secondary | ICD-10-CM

## 2023-02-19 DIAGNOSIS — I428 Other cardiomyopathies: Secondary | ICD-10-CM | POA: Diagnosis present

## 2023-02-19 DIAGNOSIS — F1010 Alcohol abuse, uncomplicated: Secondary | ICD-10-CM | POA: Diagnosis present

## 2023-02-19 DIAGNOSIS — K920 Hematemesis: Secondary | ICD-10-CM | POA: Diagnosis not present

## 2023-02-19 DIAGNOSIS — Z79899 Other long term (current) drug therapy: Secondary | ICD-10-CM

## 2023-02-19 DIAGNOSIS — Z8673 Personal history of transient ischemic attack (TIA), and cerebral infarction without residual deficits: Secondary | ICD-10-CM

## 2023-02-19 DIAGNOSIS — E1122 Type 2 diabetes mellitus with diabetic chronic kidney disease: Secondary | ICD-10-CM | POA: Diagnosis not present

## 2023-02-19 DIAGNOSIS — I132 Hypertensive heart and chronic kidney disease with heart failure and with stage 5 chronic kidney disease, or end stage renal disease: Secondary | ICD-10-CM | POA: Diagnosis present

## 2023-02-19 DIAGNOSIS — J9811 Atelectasis: Secondary | ICD-10-CM | POA: Diagnosis present

## 2023-02-19 DIAGNOSIS — M7989 Other specified soft tissue disorders: Secondary | ICD-10-CM | POA: Diagnosis not present

## 2023-02-19 DIAGNOSIS — F17210 Nicotine dependence, cigarettes, uncomplicated: Secondary | ICD-10-CM | POA: Diagnosis present

## 2023-02-19 DIAGNOSIS — Z888 Allergy status to other drugs, medicaments and biological substances status: Secondary | ICD-10-CM

## 2023-02-19 DIAGNOSIS — I5042 Chronic combined systolic (congestive) and diastolic (congestive) heart failure: Secondary | ICD-10-CM | POA: Diagnosis present

## 2023-02-19 DIAGNOSIS — E039 Hypothyroidism, unspecified: Secondary | ICD-10-CM | POA: Diagnosis not present

## 2023-02-19 DIAGNOSIS — E875 Hyperkalemia: Secondary | ICD-10-CM | POA: Diagnosis present

## 2023-02-19 DIAGNOSIS — Z794 Long term (current) use of insulin: Secondary | ICD-10-CM

## 2023-02-19 DIAGNOSIS — I2510 Atherosclerotic heart disease of native coronary artery without angina pectoris: Secondary | ICD-10-CM | POA: Diagnosis present

## 2023-02-19 DIAGNOSIS — K922 Gastrointestinal hemorrhage, unspecified: Secondary | ICD-10-CM | POA: Diagnosis present

## 2023-02-19 DIAGNOSIS — D631 Anemia in chronic kidney disease: Secondary | ICD-10-CM | POA: Diagnosis present

## 2023-02-19 DIAGNOSIS — Z8249 Family history of ischemic heart disease and other diseases of the circulatory system: Secondary | ICD-10-CM

## 2023-02-19 DIAGNOSIS — Z95828 Presence of other vascular implants and grafts: Secondary | ICD-10-CM

## 2023-02-19 DIAGNOSIS — N2581 Secondary hyperparathyroidism of renal origin: Secondary | ICD-10-CM | POA: Diagnosis present

## 2023-02-19 DIAGNOSIS — R112 Nausea with vomiting, unspecified: Principal | ICD-10-CM

## 2023-02-19 DIAGNOSIS — R1110 Vomiting, unspecified: Secondary | ICD-10-CM | POA: Diagnosis not present

## 2023-02-19 DIAGNOSIS — Z91158 Patient's noncompliance with renal dialysis for other reason: Secondary | ICD-10-CM

## 2023-02-19 LAB — COMPREHENSIVE METABOLIC PANEL
ALT: 11 U/L (ref 0–44)
AST: 15 U/L (ref 15–41)
Albumin: 2.7 g/dL — ABNORMAL LOW (ref 3.5–5.0)
Alkaline Phosphatase: 78 U/L (ref 38–126)
Anion gap: 13 (ref 5–15)
BUN: 62 mg/dL — ABNORMAL HIGH (ref 8–23)
CO2: 20 mmol/L — ABNORMAL LOW (ref 22–32)
Calcium: 8.7 mg/dL — ABNORMAL LOW (ref 8.9–10.3)
Chloride: 106 mmol/L (ref 98–111)
Creatinine, Ser: 5.64 mg/dL — ABNORMAL HIGH (ref 0.61–1.24)
GFR, Estimated: 10 mL/min — ABNORMAL LOW (ref >60)
Glucose, Bld: 44 mg/dL — CL (ref 70–99)
Potassium: 5.4 mmol/L — ABNORMAL HIGH (ref 3.5–5.1)
Sodium: 139 mmol/L (ref 135–145)
Total Bilirubin: 0.6 mg/dL (ref 0.0–1.2)
Total Protein: 6.8 g/dL (ref 6.5–8.1)

## 2023-02-19 LAB — CBG MONITORING, ED
Glucose-Capillary: 46 mg/dL — ABNORMAL LOW (ref 70–99)
Glucose-Capillary: 120 mg/dL — ABNORMAL HIGH (ref 70–99)
Glucose-Capillary: 71 mg/dL (ref 70–99)
Glucose-Capillary: 115 mg/dL — ABNORMAL HIGH (ref 70–99)
Glucose-Capillary: 96 mg/dL (ref 70–99)

## 2023-02-19 LAB — CBC
HCT: 44.4 % (ref 39.0–52.0)
Hemoglobin: 14.1 g/dL (ref 13.0–17.0)
MCH: 29.9 pg (ref 26.0–34.0)
MCHC: 31.8 g/dL (ref 30.0–36.0)
MCV: 94.1 fL (ref 80.0–100.0)
Platelets: 216 10*3/uL (ref 150–400)
RBC: 4.72 MIL/uL (ref 4.22–5.81)
RDW: 14.3 % (ref 11.5–15.5)
WBC: 5 10*3/uL (ref 4.0–10.5)
nRBC: 0 % (ref 0.0–0.2)

## 2023-02-19 LAB — TYPE AND SCREEN
ABO/RH(D): O POS
Antibody Screen: NEGATIVE

## 2023-02-19 LAB — LIPASE, BLOOD: Lipase: 17 U/L (ref 11–51)

## 2023-02-19 LAB — PROTIME-INR
INR: 1.2 (ref 0.8–1.2)
Prothrombin Time: 15.6 s — ABNORMAL HIGH (ref 11.4–15.2)

## 2023-02-19 MED ORDER — DEXTROSE 50 % IV SOLN
1.0000 | Freq: Once | INTRAVENOUS | Status: AC
  Administered 2023-02-19: 50 mL via INTRAVENOUS
  Filled 2023-02-19: qty 50

## 2023-02-19 MED ORDER — NEPRO/CARBSTEADY PO LIQD
237.0000 mL | Freq: Three times a day (TID) | ORAL | Status: DC
  Administered 2023-02-19 – 2023-02-22 (×8): 237 mL via ORAL

## 2023-02-19 MED ORDER — PREDNISOLONE ACETATE 1 % OP SUSP: 1.0000 [drp] | OPHTHALMIC | Status: DC

## 2023-02-19 MED ORDER — HEPARIN SODIUM (PORCINE) 1000 UNIT/ML DIALYSIS: 1000.0000 [IU] | INTRAMUSCULAR | Status: DC | PRN

## 2023-02-19 MED ORDER — GLUCOSE 40 % PO GEL: 1.0000 | Freq: Once | ORAL | Status: DC | PRN

## 2023-02-19 MED ORDER — INSULIN ASPART 100 UNIT/ML IJ SOLN
0.0000 [IU] | Freq: Three times a day (TID) | INTRAMUSCULAR | Status: DC
  Administered 2023-02-20: 2 [IU] via SUBCUTANEOUS
  Administered 2023-02-21: 4 [IU] via SUBCUTANEOUS
  Administered 2023-02-21: 1 [IU] via SUBCUTANEOUS
  Filled 2023-02-19 (×3): qty 1

## 2023-02-19 MED ORDER — INSULIN ASPART 100 UNIT/ML IJ SOLN: 3.0000 [IU] | Freq: Three times a day (TID) | INTRAMUSCULAR | Status: DC

## 2023-02-19 MED ORDER — LEVOTHYROXINE SODIUM 50 MCG PO TABS
75.0000 ug | ORAL_TABLET | Freq: Every day | ORAL | Status: DC
  Administered 2023-02-21 – 2023-02-22 (×2): 75 ug via ORAL
  Filled 2023-02-19 (×2): qty 2

## 2023-02-19 MED ORDER — ONDANSETRON HCL 4 MG/2ML IJ SOLN
4.0000 mg | Freq: Once | INTRAMUSCULAR | Status: AC
  Administered 2023-02-19: 4 mg via INTRAVENOUS
  Filled 2023-02-19: qty 2

## 2023-02-19 MED ORDER — ATROPINE SULFATE 1 % OP SOLN
1.0000 [drp] | Freq: Two times a day (BID) | OPHTHALMIC | Status: DC
  Administered 2023-02-20 – 2023-02-22 (×4): 1 [drp] via OPHTHALMIC
  Filled 2023-02-19 (×3): qty 2

## 2023-02-19 MED ORDER — TAMSULOSIN HCL 0.4 MG PO CAPS
0.4000 mg | ORAL_CAPSULE | Freq: Every day | ORAL | Status: DC
  Administered 2023-02-19 – 2023-02-21 (×3): 0.4 mg via ORAL
  Filled 2023-02-19 (×3): qty 1

## 2023-02-19 MED ORDER — ADULT MULTIVITAMIN W/MINERALS CH
1.0000 | ORAL_TABLET | Freq: Every day | ORAL | Status: DC
  Administered 2023-02-20 – 2023-02-22 (×3): 1 via ORAL
  Filled 2023-02-19 (×4): qty 1

## 2023-02-19 MED ORDER — INSULIN ASPART 100 UNIT/ML IJ SOLN: 0.0000 [IU] | Freq: Every day | INTRAMUSCULAR | Status: DC

## 2023-02-19 MED ORDER — INSULIN GLARGINE-YFGN 100 UNIT/ML ~~LOC~~ SOLN
8.0000 [IU] | Freq: Every day | SUBCUTANEOUS | Status: DC
  Filled 2023-02-19: qty 0.08

## 2023-02-19 MED ORDER — HYDROXYZINE HCL 10 MG PO TABS
10.0000 mg | ORAL_TABLET | Freq: Every day | ORAL | Status: DC | PRN
  Filled 2023-02-19: qty 1

## 2023-02-19 MED ORDER — PANTOPRAZOLE SODIUM 40 MG IV SOLR
40.0000 mg | Freq: Once | INTRAVENOUS | Status: AC
  Administered 2023-02-19: 40 mg via INTRAVENOUS
  Filled 2023-02-19: qty 10

## 2023-02-19 MED ORDER — ACETAMINOPHEN 325 MG PO TABS: 650.0000 mg | ORAL_TABLET | Freq: Four times a day (QID) | ORAL | Status: DC | PRN

## 2023-02-19 MED ORDER — ALTEPLASE 2 MG IJ SOLR: 2.0000 mg | Freq: Once | INTRAMUSCULAR | Status: DC | PRN

## 2023-02-19 MED ORDER — CHLORHEXIDINE GLUCONATE CLOTH 2 % EX PADS
6.0000 | MEDICATED_PAD | Freq: Every day | CUTANEOUS | Status: DC
  Administered 2023-02-21 – 2023-02-22 (×2): 6 via TOPICAL
  Filled 2023-02-19: qty 6

## 2023-02-19 MED ORDER — VITAMIN D 25 MCG (1000 UNIT) PO TABS
2000.0000 [IU] | ORAL_TABLET | Freq: Every day | ORAL | Status: DC
  Administered 2023-02-20 – 2023-02-22 (×3): 2000 [IU] via ORAL
  Filled 2023-02-19 (×4): qty 2

## 2023-02-19 NOTE — H&P (Addendum)
History and Physical    Patient: Shawn [name] DOB: 05-Dec-1955 DOA: 02/19/2023 DOS: the patient was seen and examined on 02/19/2023 [facility]  Patient coming from: Home  Chief Complaint:  Chief Complaint  Patient presents with   Emesis   HPI: Shawn [name] is a 68 y.o. male with medical history significant of ESRD on hemodialysis, hypertension, diabetes mellitus type II, congestive heart failure, coronary artery disease, CVA, alcohol abuse, tobacco abuse and hypothyroidism brought in by EMS for multiple episodes of vomiting.  Patient reports vomiting dark blood like material.  Reportedly 5-6 episodes.  He also reports it could be some sputum mixed with blood at times.  He missed his dialysis yesterday because he was not feeling well.  He denies any prior history of GI bleed. Review of Systems: As mentioned in the history of present illness. All other systems reviewed and are negative. Past Medical History:  Diagnosis Date   Chronic combined systolic (congestive) and diastolic (congestive) heart failure ([name])    a. TTE 6/15: EF < 20%, mildly dilated LV, DD, mildly dilated LA, mod dilated RA, mild MR, mild to mod TR, mod increased posterior wall thickness, elevated LA and LVEDP; b. 4/12019 Echo: EF 20-25%, diff HK. Gr1 DD, nl RV fxn.   CKD (chronic kidney disease), stage II    Diabetes mellitus with complication ([name])    a. Prior admissions w/ DKA (last 12/2017).   Essential hypertension    NICM (nonischemic cardiomyopathy) ([name])    a. 06/2013 Echo: EF<20%; b. 06/2013 Cath: no signif dzs; c. 04/2017 Echo: 20-25%, gr1 DD.; d.05/2018 Echo: 25-30%   Polysubstance abuse ([name])    a. etoh and tobacco   Stroke [name])    Past Surgical History:  Procedure Laterality Date   CENTRAL LINE INSERTION N/A 06/29/2021   Procedure: CENTRAL LINE INSERTION;  Surgeon: Yvonne Kendall, MD;  Location: [facility] INVASIVE CV LAB;  Service: Cardiovascular;  Laterality: N/A;    IR PARACENTESIS  09/09/2022   NO PAST SURGERIES     RIGHT HEART CATH N/A 06/29/2021   Procedure: RIGHT HEART CATH;  Surgeon: Yvonne Kendall, MD;  Location: [facility] INVASIVE CV LAB;  Service: Cardiovascular;  Laterality: N/A;   Social History:  reports that he has been smoking cigarettes. He has never used smokeless tobacco. He reports that he does not currently use alcohol. He reports that he does not use drugs.  Allergies  Allergen Reactions   Empagliflozin     Other Reaction(s): Acidosis   Pravastatin     Other reaction(s): Muscle pain   Simvastatin     Other reaction(s): Muscle pain    Family History  Problem Relation Age of Onset   Congestive Heart Failure Mother    Diabetes Mother    Congestive Heart Failure Brother     Prior to Admission medications   Medication Sig Start Date End Date Taking? Authorizing Provider  acetaminophen (TYLENOL) 325 MG tablet Take 2 tablets (650 mg total) by mouth every 6 (six) hours as needed for mild pain (or Fever >/= 101). 01/09/21   Dorcas Carrow, MD  ammonium lactate (LAC-HYDRIN) 12 % lotion Apply 1 Application topically as needed for dry skin.    [provider]  aspirin EC 81 MG tablet Take 81 mg by mouth daily.    [provider]  atorvastatin (LIPITOR) 80 MG tablet Take 40 mg by mouth at bedtime.    [provider]  atropine 1 % ophthalmic solution Place  1 drop into the right eye 2 (two) times daily. 10/19/22   [provider]  carvedilol (COREG) 6.25 MG tablet Take 0.5 tablets (3.125 mg total) by mouth 2 (two) times daily. Reduced from 6.25 mg. 11/16/22 11/16/23  Darlin Priestly, MD  cholecalciferol (VITAMIN D) 25 MCG (1000 UNIT) tablet Take 2,000 Units by mouth daily.    [provider]  dextrose (GLUTOSE) 40 % GEL Take 1 Tube by mouth once as needed for low blood sugar.    [provider]  hydrOXYzine (ATARAX) 10 MG tablet Take 10 mg by mouth daily as needed for itching or anxiety.    [provider]  insulin aspart (NOVOLOG) 100 UNIT/ML FlexPen Inject 3 Units into the skin 3 (three) times daily with meals. 11/01/22   Agbata, Tochukwu, MD  insulin glargine-yfgn (SEMGLEE) 100 UNIT/ML injection Inject 0.08 mLs (8 Units total) into the skin daily. 11/16/22   Darlin Priestly, MD  levothyroxine (SYNTHROID) 75 MCG tablet Take 75 mcg by mouth daily before breakfast.    [provider]  Multiple Vitamin (MULTIVITAMIN WITH MINERALS) TABS tablet Take 1 tablet by mouth daily. 01/10/21   Dorcas Carrow, MD  Nutritional Supplements (FEEDING SUPPLEMENT, NEPRO CARB STEADY,) LIQD Take 237 mLs by mouth 3 (three) times daily between meals. 11/16/22   Darlin Priestly, MD  omeprazole (PRILOSEC) 20 MG capsule Take 20 mg by mouth daily.    [provider]  prednisoLONE acetate (PRED FORTE) 1 % ophthalmic suspension Place 1 drop into the right eye as directed. 10/19/22   [provider]  tamsulosin (FLOMAX) 0.4 MG CAPS capsule Take 1 capsule (0.4 mg total) by mouth daily after supper. 09/09/22   Sunnie Nielsen, DO  torsemide (DEMADEX) 20 MG tablet Take 60 mg by mouth as directed. Take 3 tablets (60mg ) by mouth every Sunday, Tuesday, Thursday and Saturday    [provider]    Physical Exam: Vitals:   02/19/23 0832 02/19/23 0836  BP:  115/80  Pulse:  (!) 58  Resp:  11  Temp:  97.9 F (36.6 C)  TempSrc:  Oral  SpO2:  98%  Weight: 57.8 kg   Height: 5\' 11"  (1.72 m)    68 year old male lying in the bed comfortably without any acute distress Lungs clear to auscultation bilaterally Heart regular rate and rhythm Abdomen soft, benign Neuro alert and awake, nonfocal Skin no rash or lesion HD access: Right chest tunneled catheter Data Reviewed:  Potassium 5.4  Assessment and Plan:  Hematemesis Is none observed while here in the emergency department. Monitor H&H GI consult, this could be due to vomiting and stretching  Hyperkalemia with ESRD Will need dialysis  today.  Nephrology team is aware.  Potassium 5.4  DM type II With some hypoglycemia episodes likely due to poor p.o. intake.  Sugar of 46 this morning Will initiate hypoglycemia protocol, continue insulin sliding scale  Acquired hypothyroidism Continue Synthroid   Advance Care Planning:   Code Status: Full Code   Consults: Nephrology, GI  Family Communication: None at bedside  Severity of Illness: The appropriate patient status for this patient is OBSERVATION. Observation status is judged to be reasonable and necessary in order to provide the required intensity of service to ensure the patient's safety. The patient's presenting symptoms, physical exam findings, and initial radiographic and laboratory data in the context of their medical condition is felt to place them at decreased risk for further clinical deterioration. Furthermore, it is anticipated that the patient will be  medically stable for discharge from the hospital within 2 midnights of admission.   Author: Delfino Lovett, MD 02/19/2023 1:13 PM  For on call review [email or web address].

## 2023-02-19 NOTE — ED Notes (Addendum)
Received a call from lab at this time for a critical Blood sugar of 44. This RN involved in emergent pt care in another rm.

## 2023-02-19 NOTE — Progress Notes (Addendum)
[facility]  ROUNDING NOTE   Subjective:   Shawn [name] is a 68 y.o. male with past medical conditions including end stage renal disease on hemodialysis, hypertension, diabetes mellitus type II, congestive heart failure, coronary artery disease, CVA, alcohol abuse, tobacco abuse and hypothyroidism. He presents to the ED for nausea and vomiting. Patient has been admitted under observation for GI bleed [K92.2]   Patient is known to our practice and receives outpatient dialysis treatments at [facility] on a MWF schedule, supervised by Dr Thedore Mins.  Patient states he began having nausea and vomiting up blood yesterday.  Reports of progressive weakness over this past week.  Was too weak to attend scheduled dialysis on Friday.  Denies any blood in stool.  Appetite remains poor.  Denies any shortness of breath or cough  Labs on ED arrival concerning for potassium 5.4, glucose 44, BUN 62, creatinine 5.64 with GFR 10, albumin 2.7, and hemoglobin 14.1.  Chest x-ray shows a small lower lobe left pleural effusion and a moderate right basilar atelectasis.  We have been consulted to manage dialysis needs.   Objective:  Vital signs in last 24 hours:  Temp:  [97.9 F (36.6 C)] 97.9 F (36.6 C) (02/15 0836) Pulse Rate:  [58] 58 (02/15 0836) Resp:  [11] 11 (02/15 0836) BP: (115)/(80) 115/80 (02/15 0836) SpO2:  [98 %] 98 % (02/15 0836) Weight:  [57.8 kg] 57.8 kg (02/15 0832)  Weight change:  Filed Weights   02/19/23 0832  Weight: 57.8 kg    Intake/Output: No intake/output data recorded.   Intake/Output this shift:  No intake/output data recorded.  Physical Exam: General: NAD, resting on stretcher  Head: Normocephalic, atraumatic. Moist oral mucosal membranes  Eyes: Anicteric  Lungs:  Clear to auscultation, normal effort  Heart: Regular rate and rhythm  Abdomen:  Soft, nontender, nondistended  Extremities: Trace to 1+ peripheral edema.  Neurologic: Alert, moving  all four extremities  Skin: No lesions  Access: Right chest tunneled catheter    Basic Metabolic Panel: Recent Labs  Lab 02/19/23 0852  NA 139  K 5.4*  CL 106  CO2 20*  GLUCOSE 44*  BUN 62*  CREATININE 5.64*  CALCIUM 8.7*    Liver Function Tests: Recent Labs  Lab 02/19/23 0852  AST 15  ALT 11  ALKPHOS 78  BILITOT 0.6  PROT 6.8  ALBUMIN 2.7*   Recent Labs  Lab 02/19/23 0852  LIPASE 17   No results for input(s): "AMMONIA" in the last 168 hours.  CBC: Recent Labs  Lab 02/19/23 0852  WBC 5.0  HGB 14.1  HCT 44.4  MCV 94.1  PLT 216    Cardiac Enzymes: No results for input(s): "CKTOTAL", "CKMB", "CKMBINDEX", "TROPONINI" in the last 168 hours.  BNP: Invalid input(s): "POCBNP"  CBG: Recent Labs  Lab 02/19/23 1014 02/19/23 1135  GLUCAP 46* 120*    Microbiology: Results for orders placed or performed during the hospital encounter of 11/10/22  Body fluid culture w Gram Stain     Status: None   Collection Time: 11/10/22  4:24 PM   Specimen: PATH Cytology Peritoneal fluid  Result Value Ref Range Status   Specimen Description   Final    PERITONEAL Performed at [facility], [address] [place], [place] [address]    Special Requests   Final    PERITONEAL Performed at [facility], [address], [place], [place] [address]    Gram Stain   Final  WBC PRESENT, PREDOMINANTLY MONONUCLEAR NO ORGANISMS SEEN CYTOSPIN SMEAR    Culture   Final    NO GROWTH 3 DAYS Performed at [facility], [address], [place], [place] [address]    Report Status 11/14/2022 FINAL  Final  Gastrointestinal Panel by PCR , Stool     Status: None   Collection Time: 11/11/22  8:32 AM   Specimen: STOOL  Result Value Ref Range Status   Campylobacter species NOT DETECTED NOT DETECTED Final   Plesimonas shigelloides NOT DETECTED NOT DETECTED Final   Salmonella species NOT DETECTED NOT DETECTED Final   Yersinia enterocolitica NOT DETECTED NOT  DETECTED Final   Vibrio species NOT DETECTED NOT DETECTED Final   Vibrio cholerae NOT DETECTED NOT DETECTED Final   Enteroaggregative E coli (EAEC) NOT DETECTED NOT DETECTED Final   Enteropathogenic E coli (EPEC) NOT DETECTED NOT DETECTED Final   Enterotoxigenic E coli (ETEC) NOT DETECTED NOT DETECTED Final   Shiga like toxin producing E coli (STEC) NOT DETECTED NOT DETECTED Final   Shigella/Enteroinvasive E coli (EIEC) NOT DETECTED NOT DETECTED Final   Cryptosporidium NOT DETECTED NOT DETECTED Final   Cyclospora cayetanensis NOT DETECTED NOT DETECTED Final   Entamoeba histolytica NOT DETECTED NOT DETECTED Final   Giardia lamblia NOT DETECTED NOT DETECTED Final   Adenovirus F40/41 NOT DETECTED NOT DETECTED Final   Astrovirus NOT DETECTED NOT DETECTED Final   Norovirus GI/GII NOT DETECTED NOT DETECTED Final   Rotavirus A NOT DETECTED NOT DETECTED Final   Sapovirus (I, II, IV, and V) NOT DETECTED NOT DETECTED Final    Comment: Performed at [facility], [address] [place], [place] [address]  C Difficile Quick Screen w PCR reflex     Status: Abnormal   Collection Time: 11/11/22  8:35 AM   Specimen: STOOL  Result Value Ref Range Status   C Diff antigen POSITIVE (A) NEGATIVE Final   C Diff toxin NEGATIVE NEGATIVE Final   C Diff interpretation Results are indeterminate. See PCR results.  Final    Comment: Performed at [facility], [address] [place], [place] [address]  C. Diff by PCR, Reflexed     Status: Abnormal   Collection Time: 11/11/22  8:35 AM  Result Value Ref Range Status   Toxigenic C. Difficile by PCR POSITIVE (A) NEGATIVE Final    Comment: Positive for toxigenic C. difficile with little to no toxin production. Only treat if clinical presentation suggests symptomatic illness. Performed at [facility], [address] [place], [place] [address]     Coagulation Studies: Recent Labs    02/19/23 0852  LABPROT 15.6*  INR 1.2     Urinalysis: No results for input(s): "COLORURINE", "LABSPEC", "PHURINE", "GLUCOSEU", "HGBUR", "BILIRUBINUR", "KETONESUR", "PROTEINUR", "UROBILINOGEN", "NITRITE", "LEUKOCYTESUR" in the last 72 hours.  Invalid input(s): "APPERANCEUR"    Imaging: DG Chest Port 1 View Result Date: 02/19/2023 CLINICAL DATA:  Shortness of breath and emesis. EXAM: PORTABLE CHEST 1 VIEW COMPARISON:  10/27/2022 FINDINGS: Stable cardiac enlargement and positioning of tunneled dialysis catheter. Left lower lobe atelectasis and probable trace/small left pleural effusion. Mild atelectasis at the right lung base. No overt pulmonary edema or pneumothorax. The visualized skeletal structures are unremarkable. IMPRESSION: Left lower lobe atelectasis and probable trace/small left pleural effusion. Mild right basilar atelectasis. Electronically Signed   By: Irish Lack M.D.   On: 02/19/2023 09:36     Medications:     atropine  1 drop Right Eye BID   [  START ON 02/20/2023] Chlorhexidine Gluconate Cloth  6 each Topical Q0600   cholecalciferol  2,000 Units Oral Daily   feeding supplement (NEPRO CARB STEADY)  237 mL Oral TID BM   insulin aspart  3 Units Subcutaneous TID WC   insulin glargine-yfgn  8 Units Subcutaneous Daily   [START ON 02/20/2023] levothyroxine  75 mcg Oral QAC breakfast   multivitamin with minerals  1 tablet Oral Daily   prednisoLONE acetate  1 drop Right Eye UD   tamsulosin  0.4 mg Oral QPC supper   acetaminophen, dextrose, hydrOXYzine  Assessment/ Plan:  Shawn [name] is a 68 y.o.  male is a 68 y.o. male with past medical conditions including end stage renal disease on hemodialysis, hypertension, diabetes mellitus type II, congestive heart failure, coronary artery disease, CVA, alcohol abuse, tobacco abuse and hypothyroidism. He presents to the ED for nausea and vomiting. Patient has been admitted under observation for GI bleed [K92.2]  [facility]/195 minutes / 60.5  kg/right IJ PermCath/MWF   Hyperkalemia with end-stage renal disease on hemodialysis.  Patient missed treatment on Friday.  Potassium 5.4.  Will offer dialysis treatment today to correct potassium.  Will attempt UF 1 L as tolerated.  Next treatment scheduled for Monday.  2.  Anemia with chronic kidney disease Hemoglobin & Hematocrit     Component Value Date/Time   HGB 14.1 02/19/2023 0852   HGB 15.7 06/18/2013 0427   HCT 44.4 02/19/2023 0852   HCT 46.5 06/18/2013 0427  Reports hemoptysis prior to admission.  Hemoglobin acceptable at this time.  No need for ESA.  Will defer workup to primary team.  3. Secondary Hyperparathyroidism: with outpatient labs: PTH 346, phosphorus 5.5, calcium 8.6 on 01/17/23.   Lab Results  Component Value Date   CALCIUM 8.7 (L) 02/19/2023   PHOS 4.8 (H) 11/15/2022    Bone minerals acceptable at this time.  Will continue to monitor during this admission.  4. Diabetes mellitus type II with chronic kidney disease/renal manifestations: insulin dependent. Home regimen includes NovoLog and glargine. Most recent hemoglobin A1c is 9.1 on 12/27/22.     LOS: 0 Clifford Coudriet 2/15/202512:51 PM

## 2023-02-19 NOTE — Progress Notes (Signed)
Received patient in bed to unit.  Alert and oriented.  Informed consent signed and in chart.   TX duration: 3 hours  Patient tolerated well.  Transported back to the room  Alert, without acute distress.  Hand-off given to patient's nurse.   Access used: RIJ Access issues: none  Total UF removed: 1000 ML Medication(s) given: none  Post HD weight: 56.8kg  Freddie Breech, RN Kidney Dialysis Unit

## 2023-02-19 NOTE — ED Triage Notes (Signed)
pt in via [facility] c/o N&V. Pt is a dialysis pt and missed dalysis yesterday. Pt states he has thrown up x6 times with dark colored blood in vomit that started yesterday. Pt states pain in chest since yesterday.

## 2023-02-19 NOTE — ED Notes (Signed)
BS is 47 MD made aware.

## 2023-02-19 NOTE — ED Provider Notes (Signed)
[facility] Provider Note    Event Date/Time   First MD Initiated Contact with Patient 02/19/23 [phone]     (approximate)   History   Emesis   HPI  Shawn [name] is a 68 y.o. male with a history of ESRD on dialysis, HFrEF, diabetes, CVA, and substance abuse who presents with multiple episodes of vomiting since yesterday.  The patient states that this morning he started vomiting dark material that looked like dark blood.  He denies any bright red blood in the vomitus.  He has had some diarrhea but denies any blood in the stool.  He has no abdominal pain.  The patient also reports some shortness of breath and lightheadedness.  He states he missed dialysis yesterday because he was not feeling well.  He has no prior history of GI bleeding.  He denies any other abnormal bleeding or bruising.  I reviewed the past medical records.  The patient was most recently admitted to the hospitalist service in November with volume overload and ascites requiring paracentesis.   Physical Exam   Triage Vital Signs: ED Triage Vitals  Encounter Vitals Group     BP --      Systolic BP Percentile --      Diastolic BP Percentile --      Pulse --      Resp --      Temp --      Temp src --      SpO2 --      Weight 02/19/23 0832 127 lb 8 oz (57.8 kg)     Height 02/19/23 0832 5\' 11"  (1.803 m)     Head Circumference --      Peak Flow --      Pain Score 02/19/23 0831 8     Pain Loc --      Pain Education --      Exclude from Growth Chart --     Most recent vital signs: Vitals:   02/19/23 0836  BP: 115/80  Pulse: (!) 58  Resp: 11  Temp: 97.9 F (36.6 C)  SpO2: 98%     General: Awake, no distress.  CV:  Good peripheral perfusion.  Resp:  Normal effort.  Abd:  Soft and nontender.  No distention.  Other:  No peripheral edema.  Oropharynx clear.   ED Results / Procedures / Treatments   Labs (all labs ordered are listed, but only abnormal results are  displayed) Labs Reviewed  COMPREHENSIVE METABOLIC PANEL - Abnormal; Notable for the following components:      Result Value   Potassium 5.4 (*)    CO2 20 (*)    Glucose, Bld 44 (*)    BUN 62 (*)    Creatinine, Ser 5.64 (*)    Calcium 8.7 (*)    Albumin 2.7 (*)    GFR, Estimated 10 (*)    All other components within normal limits  PROTIME-INR - Abnormal; Notable for the following components:   Prothrombin Time 15.6 (*)    All other components within normal limits  CBG MONITORING, ED - Abnormal; Notable for the following components:   Glucose-Capillary 46 (*)    All other components within normal limits  LIPASE, BLOOD  CBC  URINALYSIS, ROUTINE W REFLEX MICROSCOPIC  TYPE AND SCREEN     EKG  ED ECG REPORT I, Dionne Bucy, the attending physician, personally viewed and interpreted this ECG.  Date: 02/19/2023 EKG Time: 0929 Rate: 59 Rhythm: normal sinus  rhythm QRS Axis: normal Intervals: LAFB ST/T Wave abnormalities: LVH with repolarization abnormality Narrative Interpretation: no evidence of acute ischemia    RADIOLOGY  Chest x-ray: I independently viewed and interpreted the images; there is lower lobe atelectasis with no focal consolidation or edema   PROCEDURES:  Critical Care performed: No  Procedures   MEDICATIONS ORDERED IN ED: Medications  cholecalciferol (VITAMIN D3) 25 MCG (1000 UNIT) tablet 2,000 Units (has no administration in time range)  acetaminophen (TYLENOL) tablet 650 mg (has no administration in time range)  multivitamin with minerals tablet 1 tablet (has no administration in time range)  hydrOXYzine (ATARAX) tablet 10 mg (has no administration in time range)  levothyroxine (SYNTHROID) tablet 75 mcg (has no administration in time range)  dextrose (GLUTOSE) oral gel 40% (peds > 20kg and adults) (has no administration in time range)  tamsulosin (FLOMAX) capsule 0.4 mg (has no administration in time range)  atropine 1 % ophthalmic solution  1 drop (has no administration in time range)  prednisoLONE acetate (PRED FORTE) 1 % ophthalmic suspension 1 drop (has no administration in time range)  insulin aspart (novoLOG) injection 3 Units (has no administration in time range)  feeding supplement (NEPRO CARB STEADY) liquid 237 mL (has no administration in time range)  insulin glargine-yfgn (SEMGLEE) injection 8 Units (has no administration in time range)  pantoprazole (PROTONIX) injection 40 mg (40 mg Intravenous Given 02/19/23 0910)  ondansetron (ZOFRAN) injection 4 mg (4 mg Intravenous Given 02/19/23 0909)  dextrose 50 % solution 50 mL (50 mLs Intravenous Given 02/19/23 1020)     IMPRESSION / MDM / ASSESSMENT AND PLAN / ED COURSE  I reviewed the triage vital signs and the nursing notes.  68 year old male with PMH as noted above presents with vomiting since yesterday, generalized weakness, and now reports dark blood in the vomitus this morning.  On exam the vital signs are normal.  The patient is overall relatively comfortable appearing.  Abdomen is soft and nontender.  Differential diagnosis includes, but is not limited to, gastroenteritis, gastritis, PUD, Mallory-Weiss tear, less likely esophageal varices.  The patient has no known history of ascites.  At this time, the patient does not appear acutely fluid overloaded.  We will obtain lab workup, give IV Protonix, and reassess.  Patient's presentation is most consistent with acute presentation with potential threat to life or bodily function.  The patient is on the cardiac monitor to evaluate for evidence of arrhythmia and/or significant heart rate changes.   ----------------------------------------- 11:34 AM on 02/19/2023 -----------------------------------------  I consulted and discussed the case with Dr. Thedore Mins from nephrology to evaluate the patient for possible dialysis today.  Lab workup is overall reassuring.  The patient had an episode of hypoglycemia with a glucose of 44 so  we gave D50.  His potassium is 5.4.  Hemoglobin is 14.  The patient has had no further episodes of vomiting in the ED.  However given the high risk of GI bleed we will admit for further monitoring.  I consulted Dr. Sherryll Burger from the hospitalist service; based on our discussion he agrees to evaluate patient for admission.  FINAL CLINICAL IMPRESSION(S) / ED DIAGNOSES   Final diagnoses:  Nausea and vomiting, unspecified vomiting type     Rx / DC Orders   ED Discharge Orders     None        Note:  This document was prepared using Dragon voice recognition software and may include unintentional dictation errors.    Dionne Bucy, MD  02/19/23 1136  

## 2023-02-19 NOTE — ED Notes (Signed)
BS 71. Reached out to make MD aware. Pt given 4oz of orange juice.

## 2023-02-19 NOTE — ED Notes (Signed)
This tech informed Shanda Bumps, RN of pt's CBG of 120 mg/dL.

## 2023-02-20 ENCOUNTER — Encounter: Payer: Self-pay | Admitting: Internal Medicine

## 2023-02-20 DIAGNOSIS — J9811 Atelectasis: Secondary | ICD-10-CM | POA: Diagnosis present

## 2023-02-20 DIAGNOSIS — Z8249 Family history of ischemic heart disease and other diseases of the circulatory system: Secondary | ICD-10-CM | POA: Diagnosis not present

## 2023-02-20 DIAGNOSIS — Z7982 Long term (current) use of aspirin: Secondary | ICD-10-CM | POA: Diagnosis not present

## 2023-02-20 DIAGNOSIS — I428 Other cardiomyopathies: Secondary | ICD-10-CM | POA: Diagnosis present

## 2023-02-20 DIAGNOSIS — D631 Anemia in chronic kidney disease: Secondary | ICD-10-CM | POA: Diagnosis present

## 2023-02-20 DIAGNOSIS — F1010 Alcohol abuse, uncomplicated: Secondary | ICD-10-CM | POA: Diagnosis present

## 2023-02-20 DIAGNOSIS — K922 Gastrointestinal hemorrhage, unspecified: Secondary | ICD-10-CM | POA: Diagnosis not present

## 2023-02-20 DIAGNOSIS — Z992 Dependence on renal dialysis: Secondary | ICD-10-CM | POA: Diagnosis not present

## 2023-02-20 DIAGNOSIS — N400 Enlarged prostate without lower urinary tract symptoms: Secondary | ICD-10-CM

## 2023-02-20 DIAGNOSIS — I5042 Chronic combined systolic (congestive) and diastolic (congestive) heart failure: Secondary | ICD-10-CM | POA: Diagnosis present

## 2023-02-20 DIAGNOSIS — M7989 Other specified soft tissue disorders: Secondary | ICD-10-CM | POA: Diagnosis not present

## 2023-02-20 DIAGNOSIS — Z833 Family history of diabetes mellitus: Secondary | ICD-10-CM | POA: Diagnosis not present

## 2023-02-20 DIAGNOSIS — Z79890 Hormone replacement therapy (postmenopausal): Secondary | ICD-10-CM | POA: Diagnosis not present

## 2023-02-20 DIAGNOSIS — Z95828 Presence of other vascular implants and grafts: Secondary | ICD-10-CM | POA: Diagnosis not present

## 2023-02-20 DIAGNOSIS — Z8673 Personal history of transient ischemic attack (TIA), and cerebral infarction without residual deficits: Secondary | ICD-10-CM | POA: Diagnosis not present

## 2023-02-20 DIAGNOSIS — Z794 Long term (current) use of insulin: Secondary | ICD-10-CM | POA: Diagnosis not present

## 2023-02-20 DIAGNOSIS — I2510 Atherosclerotic heart disease of native coronary artery without angina pectoris: Secondary | ICD-10-CM | POA: Diagnosis present

## 2023-02-20 DIAGNOSIS — K920 Hematemesis: Secondary | ICD-10-CM | POA: Diagnosis present

## 2023-02-20 DIAGNOSIS — N2581 Secondary hyperparathyroidism of renal origin: Secondary | ICD-10-CM | POA: Diagnosis present

## 2023-02-20 DIAGNOSIS — E11649 Type 2 diabetes mellitus with hypoglycemia without coma: Secondary | ICD-10-CM | POA: Diagnosis present

## 2023-02-20 DIAGNOSIS — F17210 Nicotine dependence, cigarettes, uncomplicated: Secondary | ICD-10-CM | POA: Diagnosis present

## 2023-02-20 DIAGNOSIS — E039 Hypothyroidism, unspecified: Secondary | ICD-10-CM | POA: Diagnosis present

## 2023-02-20 DIAGNOSIS — R1110 Vomiting, unspecified: Secondary | ICD-10-CM | POA: Diagnosis present

## 2023-02-20 DIAGNOSIS — N186 End stage renal disease: Secondary | ICD-10-CM | POA: Diagnosis present

## 2023-02-20 DIAGNOSIS — E875 Hyperkalemia: Secondary | ICD-10-CM | POA: Diagnosis present

## 2023-02-20 DIAGNOSIS — E1122 Type 2 diabetes mellitus with diabetic chronic kidney disease: Secondary | ICD-10-CM | POA: Diagnosis present

## 2023-02-20 DIAGNOSIS — I132 Hypertensive heart and chronic kidney disease with heart failure and with stage 5 chronic kidney disease, or end stage renal disease: Secondary | ICD-10-CM | POA: Diagnosis present

## 2023-02-20 DIAGNOSIS — R112 Nausea with vomiting, unspecified: Secondary | ICD-10-CM | POA: Diagnosis not present

## 2023-02-20 LAB — CBC
HCT: 36.5 % — ABNORMAL LOW (ref 39.0–52.0)
Hemoglobin: 11.8 g/dL — ABNORMAL LOW (ref 13.0–17.0)
MCH: 30.5 pg (ref 26.0–34.0)
MCHC: 32.3 g/dL (ref 30.0–36.0)
MCV: 94.3 fL (ref 80.0–100.0)
Platelets: 211 10*3/uL (ref 150–400)
RBC: 3.87 MIL/uL — ABNORMAL LOW (ref 4.22–5.81)
RDW: 14.1 % (ref 11.5–15.5)
WBC: 5.2 10*3/uL (ref 4.0–10.5)
nRBC: 0 % (ref 0.0–0.2)

## 2023-02-20 LAB — BASIC METABOLIC PANEL
Anion gap: 19 — ABNORMAL HIGH (ref 5–15)
BUN: 57 mg/dL — ABNORMAL HIGH (ref 8–23)
CO2: 17 mmol/L — ABNORMAL LOW (ref 22–32)
Calcium: 8.5 mg/dL — ABNORMAL LOW (ref 8.9–10.3)
Chloride: 98 mmol/L (ref 98–111)
Creatinine, Ser: 4.34 mg/dL — ABNORMAL HIGH (ref 0.61–1.24)
GFR, Estimated: 14 mL/min — ABNORMAL LOW (ref >60)
Glucose, Bld: 164 mg/dL — ABNORMAL HIGH (ref 70–99)
Potassium: 5.1 mmol/L (ref 3.5–5.1)
Sodium: 134 mmol/L — ABNORMAL LOW (ref 135–145)

## 2023-02-20 LAB — CBG MONITORING, ED
Glucose-Capillary: 144 mg/dL — ABNORMAL HIGH (ref 70–99)
Glucose-Capillary: 150 mg/dL — ABNORMAL HIGH (ref 70–99)
Glucose-Capillary: 155 mg/dL — ABNORMAL HIGH (ref 70–99)

## 2023-02-20 LAB — GLUCOSE, CAPILLARY
Glucose-Capillary: 216 mg/dL — ABNORMAL HIGH (ref 70–99)
Glucose-Capillary: 168 mg/dL — ABNORMAL HIGH (ref 70–99)

## 2023-02-20 MED ORDER — PANTOPRAZOLE SODIUM 40 MG PO TBEC
40.0000 mg | DELAYED_RELEASE_TABLET | Freq: Every day | ORAL | Status: DC
  Administered 2023-02-20 – 2023-02-22 (×3): 40 mg via ORAL
  Filled 2023-02-20 (×3): qty 1

## 2023-02-20 MED ORDER — MIRTAZAPINE 15 MG PO TABS
7.5000 mg | ORAL_TABLET | Freq: Every day | ORAL | Status: DC
  Administered 2023-02-20 – 2023-02-21 (×2): 7.5 mg via ORAL
  Filled 2023-02-20 (×2): qty 1

## 2023-02-20 MED ORDER — MAGNESIUM HYDROXIDE 400 MG/5ML PO SUSP
5.0000 mL | Freq: Once | ORAL | Status: AC
  Administered 2023-02-20: 5 mL via ORAL
  Filled 2023-02-20: qty 30

## 2023-02-20 NOTE — ED Notes (Signed)
 Advised nurse that patient has ready bed

## 2023-02-20 NOTE — Progress Notes (Signed)
[facility]  ROUNDING NOTE   Subjective:   Shawn [name] is a 68 y.o. male with past medical conditions including end stage renal disease on hemodialysis, hypertension, diabetes mellitus type II, congestive heart failure, coronary artery disease, CVA, alcohol abuse, tobacco abuse and hypothyroidism. He presents to the ED for nausea and vomiting. Patient has been admitted under observation for GI bleed [K92.2] Nausea and vomiting, unspecified vomiting type [R11.2]   Patient is known to our practice and receives outpatient dialysis treatments at [facility] on a MWF schedule, supervised by Dr Thedore Mins.    Patient seen sitting up in bed, alert and oriented, complains of poor oral intake   Objective:  Vital signs in last 24 hours:  Temp:  [97.4 F (36.3 C)-98 F (36.7 C)] 97.9 F (36.6 C) (02/16 1100) Pulse Rate:  [59-106] 70 (02/16 1100) Resp:  [14-21] 17 (02/16 1100) BP: (103-129)/(77-84) 125/84 (02/16 1100) SpO2:  [94 %-99 %] 94 % (02/16 1100) Weight:  [56.8 kg-57.7 kg] 56.8 kg (02/15 1908)  Weight change:  Filed Weights   02/19/23 0832 02/19/23 1538 02/19/23 1908  Weight: 57.8 kg 57.7 kg 56.8 kg    Intake/Output: I/O last 3 completed shifts: In: -  Out: 2000 [Other:2000]   Intake/Output this shift:  No intake/output data recorded.  Physical Exam: General: NAD, resting in bed  Head: Normocephalic, atraumatic. Moist oral mucosal membranes  Eyes: Anicteric  Lungs:  Clear to auscultation, normal effort  Heart: Regular rate and rhythm  Abdomen:  Soft, nontender, nondistended  Extremities: Trace peripheral edema.  Neurologic: Alert, moving all four extremities  Skin: No lesions  Access: Right chest tunneled catheter    Basic Metabolic Panel: Recent Labs  Lab 02/19/23 0852 02/20/23 1150  NA 139 134*  K 5.4* 5.1  CL 106 98  CO2 20* 17*  GLUCOSE 44* 164*  BUN 62* 57*  CREATININE 5.64* 4.34*  CALCIUM 8.7* 8.5*    Liver Function  Tests: Recent Labs  Lab 02/19/23 0852  AST 15  ALT 11  ALKPHOS 78  BILITOT 0.6  PROT 6.8  ALBUMIN 2.7*   Recent Labs  Lab 02/19/23 0852  LIPASE 17   No results for input(s): "AMMONIA" in the last 168 hours.  CBC: Recent Labs  Lab 02/19/23 0852  WBC 5.0  HGB 14.1  HCT 44.4  MCV 94.1  PLT 216    Cardiac Enzymes: No results for input(s): "CKTOTAL", "CKMB", "CKMBINDEX", "TROPONINI" in the last 168 hours.  BNP: Invalid input(s): "POCBNP"  CBG: Recent Labs  Lab 02/19/23 1441 02/19/23 2023 02/19/23 2255 02/20/23 0611 02/20/23 0818  GLUCAP 71 115* 96 144* 150*    Microbiology: Results for orders placed or performed during the hospital encounter of 11/10/22  Body fluid culture w Gram Stain     Status: None   Collection Time: 11/10/22  4:24 PM   Specimen: PATH Cytology Peritoneal fluid  Result Value Ref Range Status   Specimen Description   Final    PERITONEAL Performed at [facility], [address], [place], [place] [address]    Special Requests   Final    PERITONEAL Performed at [facility], [address] [place], [place] [address]    Gram Stain   Final    WBC PRESENT, PREDOMINANTLY MONONUCLEAR NO ORGANISMS SEEN CYTOSPIN SMEAR    Culture   Final    NO GROWTH 3 DAYS Performed at [facility], [address] [place], [place] [address]  Report Status 11/14/2022 FINAL  Final  Gastrointestinal Panel by PCR , Stool     Status: None   Collection Time: 11/11/22  8:32 AM   Specimen: STOOL  Result Value Ref Range Status   Campylobacter species NOT DETECTED NOT DETECTED Final   Plesimonas shigelloides NOT DETECTED NOT DETECTED Final   Salmonella species NOT DETECTED NOT DETECTED Final   Yersinia enterocolitica NOT DETECTED NOT DETECTED Final   Vibrio species NOT DETECTED NOT DETECTED Final   Vibrio cholerae NOT DETECTED NOT DETECTED Final   Enteroaggregative E coli (EAEC) NOT DETECTED NOT DETECTED Final   Enteropathogenic  E coli (EPEC) NOT DETECTED NOT DETECTED Final   Enterotoxigenic E coli (ETEC) NOT DETECTED NOT DETECTED Final   Shiga like toxin producing E coli (STEC) NOT DETECTED NOT DETECTED Final   Shigella/Enteroinvasive E coli (EIEC) NOT DETECTED NOT DETECTED Final   Cryptosporidium NOT DETECTED NOT DETECTED Final   Cyclospora cayetanensis NOT DETECTED NOT DETECTED Final   Entamoeba histolytica NOT DETECTED NOT DETECTED Final   Giardia lamblia NOT DETECTED NOT DETECTED Final   Adenovirus F40/41 NOT DETECTED NOT DETECTED Final   Astrovirus NOT DETECTED NOT DETECTED Final   Norovirus GI/GII NOT DETECTED NOT DETECTED Final   Rotavirus A NOT DETECTED NOT DETECTED Final   Sapovirus (I, II, IV, and V) NOT DETECTED NOT DETECTED Final    Comment: Performed at [facility], [address] [place], [place] [address]  C Difficile Quick Screen w PCR reflex     Status: Abnormal   Collection Time: 11/11/22  8:35 AM   Specimen: STOOL  Result Value Ref Range Status   C Diff antigen POSITIVE (A) NEGATIVE Final   C Diff toxin NEGATIVE NEGATIVE Final   C Diff interpretation Results are indeterminate. See PCR results.  Final    Comment: Performed at [facility], [address] [place], [place] [address]  C. Diff by PCR, Reflexed     Status: Abnormal   Collection Time: 11/11/22  8:35 AM  Result Value Ref Range Status   Toxigenic C. Difficile by PCR POSITIVE (A) NEGATIVE Final    Comment: Positive for toxigenic C. difficile with little to no toxin production. Only treat if clinical presentation suggests symptomatic illness. Performed at [facility], [address] [place], [place] [address]     Coagulation Studies: Recent Labs    02/19/23 0852  LABPROT 15.6*  INR 1.2    Urinalysis: No results for input(s): "COLORURINE", "LABSPEC", "PHURINE", "GLUCOSEU", "HGBUR", "BILIRUBINUR", "KETONESUR", "PROTEINUR", "UROBILINOGEN", "NITRITE", "LEUKOCYTESUR" in the last 72  hours.  Invalid input(s): "APPERANCEUR"    Imaging: DG Chest Port 1 View Result Date: 02/19/2023 CLINICAL DATA:  Shortness of breath and emesis. EXAM: PORTABLE CHEST 1 VIEW COMPARISON:  10/27/2022 FINDINGS: Stable cardiac enlargement and positioning of tunneled dialysis catheter. Left lower lobe atelectasis and probable trace/small left pleural effusion. Mild atelectasis at the right lung base. No overt pulmonary edema or pneumothorax. The visualized skeletal structures are unremarkable. IMPRESSION: Left lower lobe atelectasis and probable trace/small left pleural effusion. Mild right basilar atelectasis. Electronically Signed   By: Irish Lack M.D.   On: 02/19/2023 09:36     Medications:     atropine  1 drop Right Eye BID   Chlorhexidine Gluconate Cloth  6 each Topical Q0600   cholecalciferol  2,000 Units Oral Daily   feeding supplement (NEPRO CARB STEADY)  237 mL Oral TID BM   insulin aspart  0-5 Units Subcutaneous QHS  insulin aspart  0-6 Units Subcutaneous TID WC   levothyroxine  75 mcg Oral QAC breakfast   mirtazapine  7.5 mg Oral QHS   multivitamin with minerals  1 tablet Oral Daily   pantoprazole  40 mg Oral Daily   prednisoLONE acetate  1 drop Right Eye UD   tamsulosin  0.4 mg Oral QPC supper   acetaminophen, dextrose, hydrOXYzine  Assessment/ Plan:  Shawn [name] is a 68 y.o.  male is a 68 y.o. male with past medical conditions including end stage renal disease on hemodialysis, hypertension, diabetes mellitus type II, congestive heart failure, coronary artery disease, CVA, alcohol abuse, tobacco abuse and hypothyroidism. He presents to the ED for nausea and vomiting. Patient has been admitted under observation for GI bleed [K92.2] Nausea and vomiting, unspecified vomiting type [R11.2]  [facility]/195 minutes / 60.5 kg/right IJ PermCath/MWF   Hyperkalemia with end-stage renal disease on hemodialysis.  Potassium 5.4 on admission.  Patient  received dialysis yesterday, likely correction with dialysis.  No updated labs.  Next treatment scheduled for Monday.  2.  Anemia with chronic kidney disease Hemoglobin & Hematocrit     Component Value Date/Time   HGB 14.1 02/19/2023 0852   HGB 15.7 06/18/2013 0427   HCT 44.4 02/19/2023 0852   HCT 46.5 06/18/2013 0427  Reports hemoptysis prior to admission.  No episodes since admission.  Hemoglobin acceptable.  No need for ESA.    3. Secondary Hyperparathyroidism: with outpatient labs: PTH 346, phosphorus 5.5, calcium 8.6 on 01/17/23.   Lab Results  Component Value Date   CALCIUM 8.5 (L) 02/20/2023   PHOS 4.8 (H) 11/15/2022    Bone minerals acceptable at this time.  Will obtain updated labs in AM.  4. Diabetes mellitus type II with chronic kidney disease/renal manifestations: insulin dependent. Home regimen includes NovoLog and glargine. Most recent hemoglobin A1c is 9.1 on 12/27/22.   Glucose well-controlled.  Primary team to continue management of sliding scale insulin.  Will order low-dose Remeron 7.5 for appetite stimulation.    LOS: 1 Shawn [name] 2/16/20251:14 PM

## 2023-02-20 NOTE — Consult Note (Signed)
GI Inpatient Consult Note  Reason for Consult: Hematemesis    Attending Requesting Consult: Dr. Delfino Lovett, MD  History of Present Illness: Shawn [name] is a 68 y.o. male seen for evaluation of hematemesis at the request of admitting hospitalist - Dr. Delfino Lovett. Patient has a PMH of HTN, T2DM, ESRD on HD, chronic combined systolic and diastolic CHF, nonischemic cardiomyopathy, CAD, hypothyroidism, hx of CVA, alcohol abuse, and tobacco abuse. He presented to the [facility] ED via EMS yesterday morning for chief complaint of 1-day history of vomiting with concerns of dark colored blood in emesis, shortness of breath, lightheadedness, and chest pain. Upon presentation to the ED, all vital signs were stable. Labs significant for potassium 5.4, BUN 62, serum creatinine 5.64, albumin 2.7, hemoglobin 14.1, and WBC 5.0K. He missed his dialysis session yesterday and nephrology was consulted and dialysis arranged. GI consulted for concerns of hematemesis.   Patient seen and examined resting comfortably in hospital bed. No acute events overnight. He reports he feels better today. He denies any further episodes of vomiting. No gross gastrointestinal blood loss. Upon further questioning, he reports he had about 5-6 episodes of emesis yesterday before coming in. He had vomited 2-3 times without blood before he noticed some dark colored blood in the vomitus. He had been doing quite a bit of coughing and dry heaving before seeing the blood as well. He denies any further chest pain. He does endorse soreness across the middle of his abdomen. He denies any hematochezia or melena. He denies any heartburn, reflux, dysphagia, odynophagia, or nausea.   Past Medical History:  Past Medical History:  Diagnosis Date   Chronic combined systolic (congestive) and diastolic (congestive) heart failure ([name])    a. TTE 6/15: EF < 20%, mildly dilated LV, DD, mildly dilated LA, mod dilated RA, mild MR, mild to mod TR, mod  increased posterior wall thickness, elevated LA and LVEDP; b. 4/12019 Echo: EF 20-25%, diff HK. Gr1 DD, nl RV fxn.   CKD (chronic kidney disease), stage II    Diabetes mellitus with complication ([name])    a. Prior admissions w/ DKA (last 12/2017).   Essential hypertension    NICM (nonischemic cardiomyopathy) ([name])    a. 06/2013 Echo: EF<20%; b. 06/2013 Cath: no signif dzs; c. 04/2017 Echo: 20-25%, gr1 DD.; d.05/2018 Echo: 25-30%   Polysubstance abuse ([name])    a. etoh and tobacco   Stroke [facility])     Problem List: Patient Active Problem List   Diagnosis Date Noted   GI bleed 02/19/2023   Palliative care encounter 11/14/2022   Hypervolemia 11/10/2022   Diarrhea 11/10/2022   Diabetic ketoacidosis without coma associated with type 2 diabetes mellitus ([name]) 10/27/2022   Sepsis ([name]) 09/03/2022   HFrEF (heart failure with reduced ejection fraction) ([name]) 09/03/2022   Nausea and vomiting 08/30/2022   ESRD on dialysis ([name]) 08/29/2022   Acute exacerbation of CHF (congestive heart failure) ([name]) 08/13/2021   Iron deficiency anemia 07/01/2021   Hyperosmolar hyperglycemic state (HHS) ([name]) 03/16/2020   Acute metabolic encephalopathy 03/16/2020   Elevated troponin 03/16/2020   HLD (hyperlipidemia) 03/16/2020   Abnormal CXR 03/16/2020   Hypothermia 03/16/2020   Hypotension 04/30/2019   Stroke ([name])    Protein-calorie malnutrition, severe ([name]) 04/25/2019   Hyperkalemia 04/23/2019   Acute kidney injury superimposed on CKD 3b ([name]) 04/23/2019   C. difficile colitis 04/23/2019   Hypoglycemia 09/22/2018   Essential hypertension 05/12/2018   Chronic HFrEF (heart failure with reduced ejection fraction) (  [name]) 12/15/2017   NICM (nonischemic cardiomyopathy) ([name]) 05/13/2017   T2DM (type 2 diabetes mellitus) ([name]) 05/13/2017   Smoker 05/13/2017   Alcohol abuse 05/13/2017    Past Surgical History: Past Surgical History:  Procedure Laterality Date   CENTRAL LINE INSERTION N/A 06/29/2021   Procedure:  CENTRAL LINE INSERTION;  Surgeon: Yvonne Kendall, MD;  Location: [facility] INVASIVE CV LAB;  Service: Cardiovascular;  Laterality: N/A;   IR PARACENTESIS  09/09/2022   NO PAST SURGERIES     RIGHT HEART CATH N/A 06/29/2021   Procedure: RIGHT HEART CATH;  Surgeon: Yvonne Kendall, MD;  Location: [facility] INVASIVE CV LAB;  Service: Cardiovascular;  Laterality: N/A;    Allergies: Allergies  Allergen Reactions   Empagliflozin     Other Reaction(s): Acidosis   Pravastatin     Other reaction(s): Muscle pain   Simvastatin     Other reaction(s): Muscle pain    Home Medications: (Not in a hospital admission)  Home medication reconciliation was completed with the patient.   Scheduled Inpatient Medications:    atropine  1 drop Right Eye BID   Chlorhexidine Gluconate Cloth  6 each Topical Q0600   cholecalciferol  2,000 Units Oral Daily   feeding supplement (NEPRO CARB STEADY)  237 mL Oral TID BM   insulin aspart  0-5 Units Subcutaneous QHS   insulin aspart  0-6 Units Subcutaneous TID WC   levothyroxine  75 mcg Oral QAC breakfast   multivitamin with minerals  1 tablet Oral Daily   prednisoLONE acetate  1 drop Right Eye UD   tamsulosin  0.4 mg Oral QPC supper    Continuous Inpatient Infusions:    PRN Inpatient Medications:  acetaminophen, dextrose, hydrOXYzine  Family History: family history includes Congestive Heart Failure in his brother and mother; Diabetes in his mother.  The patient's family history is negative for inflammatory bowel disorders, GI malignancy, or solid organ transplantation.  Social History:   reports that he has been smoking cigarettes. He has never used smokeless tobacco. He reports that he does not currently use alcohol. He reports that he does not use drugs. The patient denies ETOH, tobacco, or drug use.   Review of Systems: Constitutional: Weight is stable.  Eyes: No changes in vision. ENT: No oral lesions, sore throat.  GI: see HPI.  Heme/Lymph: No easy  bruising.  CV: No chest pain.  GU: No hematuria.  Integumentary: No rashes.  Neuro: No headaches.  Psych: No depression/anxiety.  Endocrine: No heat/cold intolerance.  Allergic/Immunologic: No urticaria.  Resp: No cough, SOB.  Musculoskeletal: No joint swelling.    Physical Examination: BP 129/82 (BP Location: Left Arm)   Pulse 72   Temp 98 F (36.7 C) (Oral)   Resp 16   Ht 5\' 11"  (1.803 m)   Wt 56.8 kg   SpO2 97%   BMI 17.46 kg/m  Gen: NAD, alert and oriented x 4 HEENT: PEERLA, EOMI, Neck: supple, no JVD or thyromegaly Chest: CTA bilaterally, no wheezes, crackles, or other adventitious sounds CV: RRR, no m/g/c/r Abd: soft, NT, ND, +BS in all four quadrants; no HSM, guarding, ridigity, or rebound tenderness Ext: no edema, well perfused with 2+ pulses, Skin: no rash or lesions noted Lymph: no LAD  Data: Lab Results  Component Value Date   WBC 5.0 02/19/2023   HGB 14.1 02/19/2023   HCT 44.4 02/19/2023   MCV 94.1 02/19/2023   PLT 216 02/19/2023   Recent Labs  Lab 02/19/23 0852  HGB 14.1   Lab  Results  Component Value Date   NA 139 02/19/2023   K 5.4 (H) 02/19/2023   CL 106 02/19/2023   CO2 20 (L) 02/19/2023   BUN 62 (H) 02/19/2023   CREATININE 5.64 (H) 02/19/2023   Lab Results  Component Value Date   ALT 11 02/19/2023   AST 15 02/19/2023   ALKPHOS 78 02/19/2023   BILITOT 0.6 02/19/2023   Recent Labs  Lab 02/19/23 0852  INR 1.2    Assessment/Plan:  68 y/o AA male with a PMH of HTN, T2DM, ESRD on HD, chronic combined systolic and diastolic CHF, nonischemic cardiomyopathy, CAD, hypothyroidism, hx of CVA, alcohol abuse, and tobacco abuse presented to the [facility] ED 2/15 for chief complaint of nausea and vomiting. GI consulted for possible hematemesis/UGIB.  Questionable hematemesis - clinical presentation c/w Mallory-Weiss tear. He is hemodynamically stable without any overt gastrointestinal blood loss or hemodynamic instability. Hemoglobin within normal  limits. I do not suspect any ongoing UGIB.   ESRD on HD  Hyperkalemia - 2/2 ESRD  T2DM  Hypothyroidism   Recommendations:  - No plans for EGD at this time. No evidence of overt GI bleeding or anemia.  - Repeat CBC this AM to ensure still within normal limits - Start Protonix 40 mg daily and continue for 4-6 weeks - Continue supportive care per primary team - No further GI interventions planned - GI will sign off at this time. If concerns for bleeding or anemia, please call Dr. Norma Fredrickson.   Thank you for the consult. Please call with questions or concerns.  Gilda Crease, PA-C [facility] [phone]

## 2023-02-20 NOTE — Progress Notes (Signed)
1      PROGRESS NOTE    Shawn [name]  ZOX:[id number] DOB: 1955/12/21 DOA: 02/19/2023 PCP: [facility]   Brief Narrative:   68 y.o. male with medical history significant of ESRD on hemodialysis, hypertension, diabetes mellitus type II, congestive heart failure, coronary artery disease, CVA, alcohol abuse, tobacco abuse and hypothyroidism brought in by EMS for multiple episodes of vomiting.   2/15: Nephro consult.  Urgent HD due to missed dialysis 2/16: GI consult   Assessment & Plan:   Principal Problem:   GI bleed   Hematemesis none observed/reported while here  Stable H&H GI seen, this could be due to Mallory-Weiss tear PPI twice daily.  No plan for endoscopy at this time   Hyperkalemia with ESRD Had urgent HD on admission for potassium of 5.4 admits hemodialysis Next HD tomorrow   DM type II With some hypoglycemia episodes likely due to poor p.o. intake.  Sugar of 46 on admission Continue hypoglycemia protocol, continue insulin sliding scale   Acquired hypothyroidism Continue Synthroid   DVT prophylaxis: SCDs SCDs Start: 02/19/23 1123     Code Status: Full code Family Communication: None at bedside Disposition Plan: Possible discharge in next 1 to 2 days depending on clinical condition  Consultants:  GI Nephrology  Procedures:  HD    Subjective: Feeling much better.  Denies any further hematemesis while here  Objective: Vitals:   02/20/23 1000 02/20/23 1100 02/20/23 1406 02/20/23 1517  BP: 120/77 125/84 130/87   Pulse: 69 70 68   Resp: 17 17 16    Temp:  97.9 F (36.6 C)  97.8 F (36.6 C)  TempSrc:    Oral  SpO2: 98% 94% 96%   Weight:      Height:        Intake/Output Summary (Last 24 hours) at 02/20/2023 1527 Last data filed at 02/19/2023 1910 Gross per 24 hour  Intake --  Output 2000 ml  Net -2000 ml   Filed Weights   02/19/23 0832 02/19/23 1538 02/19/23 1908  Weight: 57.8 kg 57.7 kg 56.8 kg     Examination:  General exam: Appears calm and comfortable  Respiratory system: Clear to auscultation. Respiratory effort normal. Cardiovascular system: S1 & S2 heard, RRR. No JVD, murmurs, rubs, gallops or clicks. No pedal edema. Gastrointestinal system: Abdomen is soft, benign Central nervous system: Alert and oriented. No focal neurological deficits. Extremities: Symmetric 5 x 5 power. Skin: No rashes, lesions or ulcers Psychiatry: Judgement and insight appear normal. Mood & affect appropriate.  Access: Right chest tunneled dialysis catheter    Data Reviewed: I have personally reviewed following labs and imaging studies  CBC: Recent Labs  Lab 02/19/23 0852  WBC 5.0  HGB 14.1  HCT 44.4  MCV 94.1  PLT 216   Basic Metabolic Panel: Recent Labs  Lab 02/19/23 0852 02/20/23 1150  NA 139 134*  K 5.4* 5.1  CL 106 98  CO2 20* 17*  GLUCOSE 44* 164*  BUN 62* 57*  CREATININE 5.64* 4.34*  CALCIUM 8.7* 8.5*   GFR: Estimated Creatinine Clearance: 13.5 mL/min (A) (by C-G formula based on SCr of 4.34 mg/dL (H)). Liver Function Tests: Recent Labs  Lab 02/19/23 0852  AST 15  ALT 11  ALKPHOS 78  BILITOT 0.6  PROT 6.8  ALBUMIN 2.7*   Recent Labs  Lab 02/19/23 0852  LIPASE 17   No results for input(s): "AMMONIA" in the last 168 hours. Coagulation Profile: Recent Labs  Lab 02/19/23 [phone]  INR 1.2   Cardiac Enzymes: No results for input(s): "CKTOTAL", "CKMB", "CKMBINDEX", "TROPONINI" in the last 168 hours. BNP (last 3 results) No results for input(s): "PROBNP" in the last 8760 hours. HbA1C: No results for input(s): "HGBA1C" in the last 72 hours. CBG: Recent Labs  Lab 02/19/23 2023 02/19/23 2255 02/20/23 0611 02/20/23 0818 02/20/23 1321  GLUCAP 115* 96 144* 150* 155*     No results found for this or any previous visit (from the past 240 hours).       Radiology Studies: DG Chest Port 1 View Result Date: 02/19/2023 CLINICAL DATA:  Shortness of  breath and emesis. EXAM: PORTABLE CHEST 1 VIEW COMPARISON:  10/27/2022 FINDINGS: Stable cardiac enlargement and positioning of tunneled dialysis catheter. Left lower lobe atelectasis and probable trace/small left pleural effusion. Mild atelectasis at the right lung base. No overt pulmonary edema or pneumothorax. The visualized skeletal structures are unremarkable. IMPRESSION: Left lower lobe atelectasis and probable trace/small left pleural effusion. Mild right basilar atelectasis. Electronically Signed   By: Irish Lack M.D.   On: 02/19/2023 09:36        Scheduled Meds:  atropine  1 drop Right Eye BID   Chlorhexidine Gluconate Cloth  6 each Topical Q0600   cholecalciferol  2,000 Units Oral Daily   feeding supplement (NEPRO CARB STEADY)  237 mL Oral TID BM   insulin aspart  0-5 Units Subcutaneous QHS   insulin aspart  0-6 Units Subcutaneous TID WC   levothyroxine  75 mcg Oral QAC breakfast   mirtazapine  7.5 mg Oral QHS   multivitamin with minerals  1 tablet Oral Daily   pantoprazole  40 mg Oral Daily   prednisoLONE acetate  1 drop Right Eye UD   tamsulosin  0.4 mg Oral QPC supper   Continuous Infusions:   LOS: 1 day    Time spent: 35 minutes    Delfino Lovett, MD [facility] Pager [phone]  If 7PM-7AM, please contact night-coverage [email or web address]  02/20/2023, 3:27 PM

## 2023-02-21 DIAGNOSIS — E1122 Type 2 diabetes mellitus with diabetic chronic kidney disease: Secondary | ICD-10-CM | POA: Diagnosis not present

## 2023-02-21 DIAGNOSIS — N186 End stage renal disease: Secondary | ICD-10-CM | POA: Diagnosis not present

## 2023-02-21 DIAGNOSIS — K922 Gastrointestinal hemorrhage, unspecified: Secondary | ICD-10-CM | POA: Diagnosis not present

## 2023-02-21 DIAGNOSIS — E039 Hypothyroidism, unspecified: Secondary | ICD-10-CM | POA: Diagnosis not present

## 2023-02-21 LAB — BASIC METABOLIC PANEL
Anion gap: 13 (ref 5–15)
BUN: 73 mg/dL — ABNORMAL HIGH (ref 8–23)
CO2: 21 mmol/L — ABNORMAL LOW (ref 22–32)
Calcium: 8.4 mg/dL — ABNORMAL LOW (ref 8.9–10.3)
Chloride: 101 mmol/L (ref 98–111)
Creatinine, Ser: 5 mg/dL — ABNORMAL HIGH (ref 0.61–1.24)
GFR, Estimated: 12 mL/min — ABNORMAL LOW (ref >60)
Glucose, Bld: 280 mg/dL — ABNORMAL HIGH (ref 70–99)
Potassium: 5.5 mmol/L — ABNORMAL HIGH (ref 3.5–5.1)
Sodium: 135 mmol/L (ref 135–145)

## 2023-02-21 LAB — GLUCOSE, CAPILLARY
Glucose-Capillary: 312 mg/dL — ABNORMAL HIGH (ref 70–99)
Glucose-Capillary: 161 mg/dL — ABNORMAL HIGH (ref 70–99)
Glucose-Capillary: 149 mg/dL — ABNORMAL HIGH (ref 70–99)
Glucose-Capillary: 153 mg/dL — ABNORMAL HIGH (ref 70–99)

## 2023-02-21 LAB — CBC
HCT: 37.4 % — ABNORMAL LOW (ref 39.0–52.0)
Hemoglobin: 12.1 g/dL — ABNORMAL LOW (ref 13.0–17.0)
MCH: 30.5 pg (ref 26.0–34.0)
MCHC: 32.4 g/dL (ref 30.0–36.0)
MCV: 94.2 fL (ref 80.0–100.0)
Platelets: 207 10*3/uL (ref 150–400)
RBC: 3.97 MIL/uL — ABNORMAL LOW (ref 4.22–5.81)
RDW: 14.1 % (ref 11.5–15.5)
WBC: 4.7 10*3/uL (ref 4.0–10.5)
nRBC: 0 % (ref 0.0–0.2)

## 2023-02-21 LAB — PHOSPHORUS: Phosphorus: 7.4 mg/dL — ABNORMAL HIGH (ref 2.5–4.6)

## 2023-02-21 MED ORDER — INSULIN GLARGINE-YFGN 100 UNIT/ML ~~LOC~~ SOLN
5.0000 [IU] | Freq: Every day | SUBCUTANEOUS | Status: DC
Start: 2023-02-21 — End: 2023-02-22
  Administered 2023-02-21: 5 [IU] via SUBCUTANEOUS
  Filled 2023-02-21 (×2): qty 0.05

## 2023-02-21 MED ORDER — ALTEPLASE 2 MG IJ SOLR: 2.0000 mg | Freq: Once | INTRAMUSCULAR | Status: DC | PRN

## 2023-02-21 MED ORDER — HEPARIN SODIUM (PORCINE) 1000 UNIT/ML DIALYSIS: 1000.0000 [IU] | INTRAMUSCULAR | Status: DC | PRN

## 2023-02-21 NOTE — Inpatient Diabetes Management (Signed)
Inpatient Diabetes Program Recommendations  AACE/ADA: New Consensus Statement on Inpatient Glycemic Control   Target Ranges:  Prepandial:   less than 140 mg/dL      Peak postprandial:   less than 180 mg/dL (1-2 hours)      Critically ill patients:  140 - 180 mg/dL    Latest Reference Range & Units 02/20/23 08:18 02/20/23 13:21 02/20/23 17:31 02/20/23 20:23 02/21/23 07:36  Glucose-Capillary 70 - 99 mg/dL 409 (H) 811 (H) 914 (H) 168 (H) 312 (H)    Latest Reference Range & Units 02/19/23 10:14 02/19/23 11:35 02/19/23 14:41 02/19/23 20:23 02/19/23 22:55  Glucose-Capillary 70 - 99 mg/dL 46 (L) 782 (H) 71 956 (H) 96   Review of Glycemic Control  Diabetes history: DM2 Outpatient Diabetes medications: Semglee 8 units daily, Novolog 3 units TID with meals Current orders for Inpatient glycemic control: Novolog 0-6 units TID with meals, Novolog 0-5 units QHS  Inpatient Diabetes Program Recommendations:    Insulin: CBG 312 mg/dl at 2:13 am today. Please consider ordering Semglee 5 units Q24H.  Thanks, Orlando Penner, RN, MSN, CDCES Diabetes Coordinator Inpatient Diabetes Program [phone] (Team Pager from 8am to 5pm)

## 2023-02-21 NOTE — Progress Notes (Signed)
1      PROGRESS NOTE    Shawn [name]  WUJ:[id number] DOB: 25-Aug-1955 DOA: 02/19/2023 PCP: [facility]   Brief Narrative:   68 y.o. male with medical history significant of ESRD on hemodialysis, hypertension, diabetes mellitus type II, congestive heart failure, coronary artery disease, CVA, alcohol abuse, tobacco abuse and hypothyroidism brought in by EMS for multiple episodes of vomiting.   2/15: Nephro consult.  Urgent HD due to missed dialysis 2/16: GI consult 2/17: HD today, PT, OT eval, semglee 5 units   Assessment & Plan:   Principal Problem:   GI bleed   Hematemesis none observed/reported while here  Stable H&H GI seen, this could be due to Mallory-Weiss tear, signed off PPI twice daily.  No plan for endoscopy at this time   Hyperkalemia with ESRD Had urgent HD on admission for potassium of 5.4 admits hemodialysis Ongoing HD per Nephro   DM type II With some hypoglycemia episodes likely due to poor p.o. intake.  Sugar of 46 on admission Continue hypoglycemia protocol, continue insulin sliding scale, add Semglee as sugar trending up   Acquired hypothyroidism Continue Synthroid   DVT prophylaxis: SCDs SCDs Start: 02/19/23 1123     Code Status: Full code Family Communication: None at bedside Disposition Plan: Possible discharge tomorrow depending on clinical condition  Consultants:  GI Nephrology  Procedures:  HD    Subjective:  Seen at HD, feeling weak  Objective: Vitals:   02/21/23 1214 02/21/23 1216 02/21/23 1256 02/21/23 1643  BP: 112/79  118/83 106/76  Pulse: 64  64 61  Resp: 13  18 17   Temp: 97.8 F (36.6 C)  (!) 97.4 F (36.3 C) (!) 97.4 F (36.3 C)  TempSrc: Axillary   Oral  SpO2: 99% 100% 100% 100%  Weight:  57.1 kg    Height:        Intake/Output Summary (Last 24 hours) at 02/21/2023 1735 Last data filed at 02/21/2023 1216 Gross per 24 hour  Intake --  Output 1500 ml  Net -1500 ml   Filed Weights    02/19/23 1908 02/21/23 0812 02/21/23 1216  Weight: 56.8 kg 58.1 kg 57.1 kg    Examination:  General exam: Appears calm and comfortable  Respiratory system: Clear to auscultation. Respiratory effort normal. Cardiovascular system: S1 & S2 heard, RRR. No JVD, murmurs, rubs, gallops or clicks. No pedal edema. Gastrointestinal system: Abdomen is soft, benign Central nervous system: Alert and oriented. No focal neurological deficits. Extremities: Symmetric 5 x 5 power. Skin: No rashes, lesions or ulcers Psychiatry: Judgement and insight appear normal. Mood & affect appropriate.  Access: Right chest tunneled dialysis catheter    Data Reviewed: I have personally reviewed following labs and imaging studies  CBC: Recent Labs  Lab 02/19/23 0852 02/20/23 1934 02/21/23 0543  WBC 5.0 5.2 4.7  HGB 14.1 11.8* 12.1*  HCT 44.4 36.5* 37.4*  MCV 94.1 94.3 94.2  PLT 216 211 207   Basic Metabolic Panel: Recent Labs  Lab 02/19/23 0852 02/20/23 1150 02/21/23 0543  NA 139 134* 135  K 5.4* 5.1 5.5*  CL 106 98 101  CO2 20* 17* 21*  GLUCOSE 44* 164* 280*  BUN 62* 57* 73*  CREATININE 5.64* 4.34* 5.00*  CALCIUM 8.7* 8.5* 8.4*  PHOS  --   --  7.4*   GFR: Estimated Creatinine Clearance: 11.7 mL/min (A) (by C-G formula based on SCr of 5 mg/dL (H)). Liver Function Tests: Recent Labs  Lab 02/19/23 [phone]  AST 15  ALT 11  ALKPHOS 78  BILITOT 0.6  PROT 6.8  ALBUMIN 2.7*   Recent Labs  Lab 02/19/23 0852  LIPASE 17   No results for input(s): "AMMONIA" in the last 168 hours. Coagulation Profile: Recent Labs  Lab 02/19/23 0852  INR 1.2   Cardiac Enzymes: No results for input(s): "CKTOTAL", "CKMB", "CKMBINDEX", "TROPONINI" in the last 168 hours. BNP (last 3 results) No results for input(s): "PROBNP" in the last 8760 hours. HbA1C: No results for input(s): "HGBA1C" in the last 72 hours. CBG: Recent Labs  Lab 02/20/23 1731 02/20/23 2023 02/21/23 0736 02/21/23 1254 02/21/23 1638   GLUCAP 216* 168* 312* 161* 149*     No results found for this or any previous visit (from the past 240 hours).       Radiology Studies: No results found.       Scheduled Meds:  atropine  1 drop Right Eye BID   Chlorhexidine Gluconate Cloth  6 each Topical Q0600   cholecalciferol  2,000 Units Oral Daily   feeding supplement (NEPRO CARB STEADY)  237 mL Oral TID BM   insulin aspart  0-5 Units Subcutaneous QHS   insulin aspart  0-6 Units Subcutaneous TID WC   insulin glargine-yfgn  5 Units Subcutaneous Daily   levothyroxine  75 mcg Oral QAC breakfast   mirtazapine  7.5 mg Oral QHS   multivitamin with minerals  1 tablet Oral Daily   pantoprazole  40 mg Oral Daily   prednisoLONE acetate  1 drop Right Eye UD   tamsulosin  0.4 mg Oral QPC supper   Continuous Infusions:   LOS: 2 days    Time spent: 35 minutes    Delfino Lovett, MD [facility] Pager [phone]  If 7PM-7AM, please contact night-coverage [email or web address]  02/21/2023, 5:35 PM

## 2023-02-21 NOTE — Progress Notes (Signed)
[facility]  ROUNDING NOTE   Subjective:   Shawn [name] is a 68 y.o. male with past medical conditions including end stage renal disease on hemodialysis, hypertension, diabetes mellitus type II, congestive heart failure, coronary artery disease, CVA, alcohol abuse, tobacco abuse and hypothyroidism. He presents to the ED for nausea and vomiting. Patient has been admitted under observation for GI bleed [K92.2] Nausea and vomiting, unspecified vomiting type [R11.2]   Patient is known to our practice and receives outpatient dialysis treatments at [facility] on a MWF schedule, supervised by Dr Thedore Mins.    Patient seen and evaluated during dialysis   HEMODIALYSIS FLOWSHEET:  Blood Flow Rate (mL/min): 399 mL/min (Simultaneous filing. User may not have seen previous data.) Arterial Pressure (mmHg): -206.45 mmHg (Simultaneous filing. User may not have seen previous data.) Venous Pressure (mmHg): 168.07 mmHg (Simultaneous filing. User may not have seen previous data.) TMP (mmHg): 3.23 mmHg (Simultaneous filing. User may not have seen previous data.) Ultrafiltration Rate (mL/min): 686 mL/min (Simultaneous filing. User may not have seen previous data.) Dialysate Flow Rate (mL/min): 299 ml/min (Simultaneous filing. User may not have seen previous data.)  Resting in bed with blanket covering face Denies pain or discomfort at this time  Objective:  Vital signs in last 24 hours:  Temp:  [97.5 F (36.4 C)-98 F (36.7 C)] 97.6 F (36.4 C) (02/17 0812) Pulse Rate:  [60-71] 63 (02/17 1100) Resp:  [11-18] 14 (02/17 1100) BP: (114-130)/(77-91) 119/78 (02/17 1100) SpO2:  [95 %-100 %] 98 % (02/17 1100) Weight:  [58.1 kg] 58.1 kg (02/17 0812)  Weight change:  Filed Weights   02/19/23 1538 02/19/23 1908 02/21/23 0812  Weight: 57.7 kg 56.8 kg 58.1 kg    Intake/Output: I/O last 3 completed shifts: In: -  Out: 2000 [Other:2000]   Intake/Output this shift:  No  intake/output data recorded.  Physical Exam: General: NAD, resting in bed  Head: Normocephalic, atraumatic. Moist oral mucosal membranes  Eyes: Anicteric  Lungs:  Clear to auscultation, normal effort  Heart: Regular rate and rhythm  Abdomen:  Soft, nontender, nondistended  Extremities: Trace peripheral edema.  Neurologic: Alert, moving all four extremities  Skin: No lesions  Access: Right chest tunneled catheter    Basic Metabolic Panel: Recent Labs  Lab 02/19/23 0852 02/20/23 1150 02/21/23 0543  NA 139 134* 135  K 5.4* 5.1 5.5*  CL 106 98 101  CO2 20* 17* 21*  GLUCOSE 44* 164* 280*  BUN 62* 57* 73*  CREATININE 5.64* 4.34* 5.00*  CALCIUM 8.7* 8.5* 8.4*    Liver Function Tests: Recent Labs  Lab 02/19/23 0852  AST 15  ALT 11  ALKPHOS 78  BILITOT 0.6  PROT 6.8  ALBUMIN 2.7*   Recent Labs  Lab 02/19/23 0852  LIPASE 17   No results for input(s): "AMMONIA" in the last 168 hours.  CBC: Recent Labs  Lab 02/19/23 0852 02/20/23 1934 02/21/23 0543  WBC 5.0 5.2 4.7  HGB 14.1 11.8* 12.1*  HCT 44.4 36.5* 37.4*  MCV 94.1 94.3 94.2  PLT 216 211 207    Cardiac Enzymes: No results for input(s): "CKTOTAL", "CKMB", "CKMBINDEX", "TROPONINI" in the last 168 hours.  BNP: Invalid input(s): "POCBNP"  CBG: Recent Labs  Lab 02/20/23 0818 02/20/23 1321 02/20/23 1731 02/20/23 2023 02/21/23 0736  GLUCAP 150* 155* 216* 168* 312*    Microbiology: Results for orders placed or performed during the hospital encounter of 11/10/22  Body fluid culture w Gram Stain  Status: None   Collection Time: 11/10/22  4:24 PM   Specimen: PATH Cytology Peritoneal fluid  Result Value Ref Range Status   Specimen Description   Final    PERITONEAL Performed at [facility], [address] [place], [place] [address]    Special Requests   Final    PERITONEAL Performed at [facility], [address] [place], [place] [address]    Gram Stain   Final    WBC  PRESENT, PREDOMINANTLY MONONUCLEAR NO ORGANISMS SEEN CYTOSPIN SMEAR    Culture   Final    NO GROWTH 3 DAYS Performed at [facility], [address] [place], [place] [address]    Report Status 11/14/2022 FINAL  Final  Gastrointestinal Panel by PCR , Stool     Status: None   Collection Time: 11/11/22  8:32 AM   Specimen: STOOL  Result Value Ref Range Status   Campylobacter species NOT DETECTED NOT DETECTED Final   Plesimonas shigelloides NOT DETECTED NOT DETECTED Final   Salmonella species NOT DETECTED NOT DETECTED Final   Yersinia enterocolitica NOT DETECTED NOT DETECTED Final   Vibrio species NOT DETECTED NOT DETECTED Final   Vibrio cholerae NOT DETECTED NOT DETECTED Final   Enteroaggregative E coli (EAEC) NOT DETECTED NOT DETECTED Final   Enteropathogenic E coli (EPEC) NOT DETECTED NOT DETECTED Final   Enterotoxigenic E coli (ETEC) NOT DETECTED NOT DETECTED Final   Shiga like toxin producing E coli (STEC) NOT DETECTED NOT DETECTED Final   Shigella/Enteroinvasive E coli (EIEC) NOT DETECTED NOT DETECTED Final   Cryptosporidium NOT DETECTED NOT DETECTED Final   Cyclospora cayetanensis NOT DETECTED NOT DETECTED Final   Entamoeba histolytica NOT DETECTED NOT DETECTED Final   Giardia lamblia NOT DETECTED NOT DETECTED Final   Adenovirus F40/41 NOT DETECTED NOT DETECTED Final   Astrovirus NOT DETECTED NOT DETECTED Final   Norovirus GI/GII NOT DETECTED NOT DETECTED Final   Rotavirus A NOT DETECTED NOT DETECTED Final   Sapovirus (I, II, IV, and V) NOT DETECTED NOT DETECTED Final    Comment: Performed at [facility], [address] [place], [place] [address]  C Difficile Quick Screen w PCR reflex     Status: Abnormal   Collection Time: 11/11/22  8:35 AM   Specimen: STOOL  Result Value Ref Range Status   C Diff antigen POSITIVE (A) NEGATIVE Final   C Diff toxin NEGATIVE NEGATIVE Final   C Diff interpretation Results are indeterminate. See PCR results.  Final     Comment: Performed at [facility], [address] [place], [place] [address]  C. Diff by PCR, Reflexed     Status: Abnormal   Collection Time: 11/11/22  8:35 AM  Result Value Ref Range Status   Toxigenic C. Difficile by PCR POSITIVE (A) NEGATIVE Final    Comment: Positive for toxigenic C. difficile with little to no toxin production. Only treat if clinical presentation suggests symptomatic illness. Performed at [facility], [address] [place], [place] [address]     Coagulation Studies: Recent Labs    02/19/23 0852  LABPROT 15.6*  INR 1.2    Urinalysis: No results for input(s): "COLORURINE", "LABSPEC", "PHURINE", "GLUCOSEU", "HGBUR", "BILIRUBINUR", "KETONESUR", "PROTEINUR", "UROBILINOGEN", "NITRITE", "LEUKOCYTESUR" in the last 72 hours.  Invalid input(s): "APPERANCEUR"    Imaging: No results found.    Medications:     atropine  1 drop Right Eye BID   Chlorhexidine Gluconate Cloth  6 each Topical Q0600  cholecalciferol  2,000 Units Oral Daily   feeding supplement (NEPRO CARB STEADY)  237 mL Oral TID BM   insulin aspart  0-5 Units Subcutaneous QHS   insulin aspart  0-6 Units Subcutaneous TID WC   levothyroxine  75 mcg Oral QAC breakfast   mirtazapine  7.5 mg Oral QHS   multivitamin with minerals  1 tablet Oral Daily   pantoprazole  40 mg Oral Daily   prednisoLONE acetate  1 drop Right Eye UD   tamsulosin  0.4 mg Oral QPC supper   acetaminophen, alteplase, dextrose, heparin, hydrOXYzine  Assessment/ Plan:  [name] is a 68 y.o.  male is a 68 y.o. male with past medical conditions including end stage renal disease on hemodialysis, hypertension, diabetes mellitus type II, congestive heart failure, coronary artery disease, CVA, alcohol abuse, tobacco abuse and hypothyroidism. He presents to the ED for nausea and vomiting. Patient has been admitted under observation for GI bleed [K92.2] Nausea and vomiting, unspecified vomiting type  [R11.2]  [facility]/195 minutes / 60.5 kg/right IJ PermCath/MWF   Hyperkalemia with end-stage renal disease on hemodialysis.  Potassium 5.4 on admission.  Receiving scheduled dialysis today, UF 1 to 1.5 L as tolerated.  Next treatment scheduled for Wednesday.  2.  Anemia with chronic kidney disease Hemoglobin & Hematocrit     Component Value Date/Time   HGB 12.1 (L) 02/21/2023 0543   HGB 15.7 06/18/2013 0427   HCT 37.4 (L) 02/21/2023 0543   HCT 46.5 06/18/2013 0427  Reports hemoptysis prior to admission.  No episodes since admission.  No need for ESA.    3. Secondary Hyperparathyroidism: with outpatient labs: PTH 346, phosphorus 5.5, calcium 8.6 on 01/17/23.   Lab Results  Component Value Date   CALCIUM 8.4 (L) 02/21/2023   PHOS 4.8 (H) 11/15/2022    Calcium remains within desired range.  Waiting updated phosphorus level.  4. Diabetes mellitus type II with chronic kidney disease/renal manifestations: insulin dependent. Home regimen includes NovoLog and glargine. Most recent hemoglobin A1c is 9.1 on 12/27/22.   Sliding scale insulin managed by primary team..    LOS: 2 Alexis Reber 2/17/202511:20 AM

## 2023-02-21 NOTE — Plan of Care (Signed)

## 2023-02-21 NOTE — Progress Notes (Signed)
Hemodialysis note  Received patient in bed to unit. Alert and oriented.  Informed consent signed and in chart.  Treatment initiated: 0830 Treatment completed: 1214  Patient tolerated well. Transported back to room, alert without acute distress.  Report given to patient's RN.   Access used: right chest HD Catheter Access issues: none  Total UF removed: 1.5L Medication(s) given:  none  Post HD weight: 57.1 kg   Wolfgang [facility]

## 2023-02-22 ENCOUNTER — Inpatient Hospital Stay: Payer: No Typology Code available for payment source

## 2023-02-22 ENCOUNTER — Other Ambulatory Visit: Payer: Self-pay

## 2023-02-22 DIAGNOSIS — E039 Hypothyroidism, unspecified: Secondary | ICD-10-CM

## 2023-02-22 DIAGNOSIS — K922 Gastrointestinal hemorrhage, unspecified: Secondary | ICD-10-CM | POA: Diagnosis not present

## 2023-02-22 DIAGNOSIS — R112 Nausea with vomiting, unspecified: Secondary | ICD-10-CM

## 2023-02-22 DIAGNOSIS — N186 End stage renal disease: Secondary | ICD-10-CM | POA: Diagnosis not present

## 2023-02-22 LAB — GLUCOSE, CAPILLARY
Glucose-Capillary: 142 mg/dL — ABNORMAL HIGH (ref 70–99)
Glucose-Capillary: 147 mg/dL — ABNORMAL HIGH (ref 70–99)

## 2023-02-22 MED ORDER — CALCIUM ACETATE (PHOS BINDER) 667 MG PO CAPS: 1334.0000 mg | ORAL_CAPSULE | Freq: Three times a day (TID) | ORAL | Status: DC

## 2023-02-22 MED ORDER — ATORVASTATIN CALCIUM 20 MG PO TABS
20.0000 mg | ORAL_TABLET | Freq: Every day | ORAL | 0 refills | Status: DC
  Filled 2023-02-22: qty 30, 30d supply, fill #0

## 2023-02-22 MED ORDER — ORAL CARE MOUTH RINSE: 15.0000 mL | OROMUCOSAL | Status: DC | PRN

## 2023-02-22 NOTE — Plan of Care (Signed)

## 2023-02-22 NOTE — Evaluation (Signed)
Physical Therapy Evaluation Patient Details Name: Shawn [name] MRN: [id number] DOB: 09-09-1955 Today's Date: 02/22/2023  History of Present Illness  Pt is a 68 y.o. male presenting to hospital 02/19/23 with c/o multiple episodes of vomiting.  PMH includes ESRD on dialysis, HFrEF, DM, CVA, substance abuse, CHF.  Clinical Impression  Prior to recent medical concerns, pt reports requiring assist for w/c level transfers but able to propel manual w/c on own; lives with his sister in 1 level home with ramp to enter; pt reports his sister provides 24/7 assist as needed.  No c/o pain during session.  Currently pt is min assist semi-supine to sitting EOB and mod assist squat pivot transfer bed to recliner.  L LE (especially ankle and foot) swelling noted--MD and pt's nurse notified.  Pt reporting feeling weaker than normal but that his sister was able to provide necessary assist for safe home discharge.  Pt would currently benefit from skilled PT to address noted impairments and functional limitations (see below for any additional details).  Upon hospital discharge, pt would benefit from ongoing therapy.     If plan is discharge home, recommend the following: A lot of help with walking and/or transfers;A little help with bathing/dressing/bathroom;Assistance with cooking/housework;Assist for transportation;Help with stairs or ramp for entrance   Can travel by private vehicle        Equipment Recommendations None recommended by PT (pt has manual w/c at home already)  Recommendations for Other Services       Functional Status Assessment Patient has had a recent decline in their functional status and demonstrates the ability to make significant improvements in function in a reasonable and predictable amount of time.     Precautions / Restrictions Precautions Precautions: Fall Restrictions Weight Bearing Restrictions Per Provider Order: No      Mobility  Bed Mobility Overal bed mobility:  Needs Assistance (Min assist) Bed Mobility: Supine to Sit           General bed mobility comments: assist for trunk; use of bed rail; assist to scoot to EOB (pt reporting feeling "stuck" in bed)    Transfers Overall transfer level: Needs assistance Equipment used: None Transfers: Bed to chair/wheelchair/BSC       Squat pivot transfers: Mod assist     General transfer comment: mod assist squat pivot transfer bed to recliner (to L side)    Ambulation/Gait               General Gait Details: Pt declined ambulation trial d/t feeling too tired  Stairs            Wheelchair Mobility     Tilt Bed    Modified Rankin (Stroke Patients Only)       Balance Overall balance assessment: Needs assistance Sitting-balance support: No upper extremity supported, Feet supported Sitting balance-Leahy Scale: Good Sitting balance - Comments: steady reaching within BOS                                     Pertinent Vitals/Pain Pain Assessment Pain Assessment: No/denies pain HR and SpO2 on room air stable during session.    Home Living Family/patient expects to be discharged to:: Private residence Living Arrangements: Other relatives (Sister) Available Help at Discharge: Family;Available 24 hours/day Type of Home: House Home Access: Ramped entrance       Home Layout: One level Home Equipment: Scientist, research (medical) (4 wheels);Cane -  single point;BSC/3in1;Rolling Walker (2 wheels) Additional Comments: Lives with sister who provides 24/7 supervision/assist    Prior Function Prior Level of Function : Needs assist             Mobility Comments: Pt primarily uses manual w/c (propels on own); has assist from his sister for w/c level transfers and to walk short distances (no AD use). ADLs Comments: Sister assist with ADLs (including sponge baths).     Extremity/Trunk Assessment   Upper Extremity Assessment Upper Extremity Assessment:  Generalized weakness    Lower Extremity Assessment Lower Extremity Assessment: Generalized weakness    Cervical / Trunk Assessment Cervical / Trunk Assessment: Normal  Communication   Communication Communication: No apparent difficulties    Cognition Arousal: Alert Behavior During Therapy: WFL for tasks assessed/performed   PT - Cognitive impairments: No apparent impairments                         Following commands: Intact       Cueing Cueing Techniques: Verbal cues     General Comments  Nursing cleared pt for participation in physical therapy.  Pt agreeable to PT session.    Exercises     Assessment/Plan    PT Assessment Patient needs continued PT services  PT Problem List Decreased strength;Decreased activity tolerance;Decreased balance;Decreased mobility       PT Treatment Interventions DME instruction;Functional mobility training;Therapeutic activities;Therapeutic exercise;Balance training    PT Goals (Current goals can be found in the Care Plan section)  Acute Rehab PT Goals Patient Stated Goal: to improve strength PT Goal Formulation: With patient Time For Goal Achievement: 03/08/23 Potential to Achieve Goals: Good    Frequency Min 1X/week     Co-evaluation               AM-PAC PT "6 Clicks" Mobility  Outcome Measure Help needed turning from your back to your side while in a flat bed without using bedrails?: None Help needed moving from lying on your back to sitting on the side of a flat bed without using bedrails?: A Little Help needed moving to and from a bed to a chair (including a wheelchair)?: A Lot Help needed standing up from a chair using your arms (e.g., wheelchair or bedside chair)?: A Lot Help needed to walk in hospital room?: Total Help needed climbing 3-5 steps with a railing? : Total 6 Click Score: 13    End of Session Equipment Utilized During Treatment: Gait belt Activity Tolerance: Patient tolerated treatment  well Patient left: in chair;with call bell/phone within reach;Other (comment) (OT present for OT evaluation (OT to set chair alarm and set pt up when finished)) Nurse Communication: Mobility status;Precautions;Other (comment) (L LE swelling) PT Visit Diagnosis: Other abnormalities of gait and mobility (R26.89);Muscle weakness (generalized) (M62.81)    Time: 4098-1191 PT Time Calculation (min) (ACUTE ONLY): 18 min   Charges:   PT Evaluation $PT Eval Low Complexity: 1 Low PT Treatments $Therapeutic Activity: 8-22 mins PT General Charges $$ ACUTE PT VISIT: 1 Visit        Hendricks Limes, PT 02/22/23, 10:46 AM

## 2023-02-22 NOTE — TOC Initial Note (Signed)
Transition of Care [facility]) - Initial/Assessment Note    Patient Details  Name: Shawn [name] MRN: [id number] Date of Birth: Feb 19, 1955  [facility]) CM/SW Contact:    Chapman Fitch, RN Phone Number: 02/22/2023, 10:15 AM  Clinical Narrative:                    Admitted [id number]  Admitted from: Home.  States his sister [name] lives with him, and his sister Lauris Poag lives locally for support  PCP: [facility] Current home health/prior home health/DME: [facility] 5 days a week, 3-4 hours a day  Patient denies the need for home health Patient states that his sister will be transporting at discharge Patient states that he uses [name] to get to HD  Per SDOH added transportation and utility resources added to AVS      Patient Goals and CMS Choice            Expected Discharge Plan and Services         Expected Discharge Date: 02/22/23                                    Prior Living Arrangements/Services                       Activities of Daily Living   ADL Screening (condition at time of admission) Independently performs ADLs?: No Does the patient have a NEW difficulty with bathing/dressing/toileting/self-feeding that is expected to last >3 days?: No Does the patient have a NEW difficulty with getting in/out of bed, walking, or climbing stairs that is expected to last >3 days?: No Does the patient have a NEW difficulty with communication that is expected to last >3 days?: No Is the patient deaf or have difficulty hearing?: No Does the patient have difficulty seeing, even when wearing glasses/contacts?: No Does the patient have difficulty concentrating, remembering, or making decisions?: No  Permission Sought/Granted                  Emotional Assessment              Admission diagnosis:  GI bleed [K92.2] Nausea and vomiting, unspecified vomiting type [R11.2] Patient Active Problem List   Diagnosis Date  Noted   Acquired hypothyroidism 02/22/2023   GI bleed 02/19/2023   Palliative care encounter 11/14/2022   Hypervolemia 11/10/2022   Diarrhea 11/10/2022   Diabetic ketoacidosis without coma associated with type 2 diabetes mellitus ([name]) 10/27/2022   Sepsis ([name]) 09/03/2022   HFrEF (heart failure with reduced ejection fraction) ([name]) 09/03/2022   Nausea and vomiting 08/30/2022   ESRD on dialysis ([name]) 08/29/2022   Acute exacerbation of CHF (congestive heart failure) ([name]) 08/13/2021   Iron deficiency anemia 07/01/2021   Hyperosmolar hyperglycemic state (HHS) ([name]) 03/16/2020   Acute metabolic encephalopathy 03/16/2020   Elevated troponin 03/16/2020   HLD (hyperlipidemia) 03/16/2020   Abnormal CXR 03/16/2020   Hypothermia 03/16/2020   Hypotension 04/30/2019   Stroke ([name])    Protein-calorie malnutrition, severe ([name]) 04/25/2019   Hyperkalemia 04/23/2019   Acute kidney injury superimposed on CKD 3b ([name]) 04/23/2019   C. difficile colitis 04/23/2019   Hypoglycemia 09/22/2018   Essential hypertension 05/12/2018   Chronic HFrEF (heart failure with reduced ejection fraction) ([name]) 12/15/2017   NICM (nonischemic cardiomyopathy) ([name]) 05/13/2017   T2DM (type 2 diabetes mellitus) ([name]) 05/13/2017  Smoker 05/13/2017   Alcohol abuse 05/13/2017   PCP:  [facility]:   [facility] - [place], [place] - [address] [place] [address] Phone: [phone] Fax: [phone]  [facility] [address]), [place] - [address] [place]) [place] [address] Phone: [phone] Fax: [phone]  [facility] [address] [place] [address] Phone: [phone] Fax: [phone]  [facility] [place], [place] - [address] [place] [address] Phone: [phone] Fax: [phone]     Social Drivers of  Health (SDOH) Social History: SDOH Screenings   Food Insecurity: No Food Insecurity (02/20/2023)  Housing: High Risk (02/20/2023)  Transportation Needs: Unmet Transportation Needs (02/20/2023)  Utilities: At Risk (02/20/2023)  Financial Resource Strain: High Risk (08/24/2019)   [facility], [facility]  Social Connections: Moderately Isolated (02/20/2023)  Stress: No Stress Concern Present (08/24/2019)   [facility] from [facility], [facility]  Tobacco Use: High Risk (02/20/2023)   SDOH Interventions: Transportation Interventions: Inpatient TOC, Other (Comment) (resources added to AVS) Utilities Interventions: Inpatient TOC, Other (Comment) (resources added to avs)   Readmission Risk Interventions    02/22/2023   10:15 AM 09/04/2022   11:28 AM 06/24/2021    2:57 PM  Readmission Risk Prevention Plan  Transportation Screening Complete Complete Complete  PCP or Specialist Appt within 3-5 Days  Complete   HRI or Home Care Consult  Complete   Social Work Consult for Recovery Care Planning/Counseling  Complete   Palliative Care Screening  Not Applicable   Medication Review Oceanographer) Complete Complete Complete  HRI or Home Care Consult Patient refused    SW Recovery Care/Counseling Consult Complete    Palliative Care Screening   Not Applicable  Skilled Nursing Facility Not Applicable  Not Applicable

## 2023-02-22 NOTE — Evaluation (Signed)
Occupational Therapy Evaluation Patient Details Name: Shawn [name] MRN: [id number] DOB: February 21, 1955 Today's Date: 02/22/2023   History of Present Illness   68 y.o. male with medical history significant of ESRD on hemodialysis, hypertension, diabetes mellitus type II, congestive heart failure, coronary artery disease, CVA, alcohol abuse, tobacco abuse and hypothyroidism brought in by EMS for multiple episodes of vomiting.     Clinical Impressions Pt was seen for OT evaluation this date. Pt reports eager to discharge and sister is able to provide current needed level of assist with ADL and mobility. Prior to hospital admission, pt was living at home with his sister who assists with transfers to/from w/c, sponge bathing, dressing, and all IADL. Pt presents to acute OT demonstrating impaired ADL performance and functional mobility 2/2 decreased strength, balance, and activity tolerance (See OT problem list for additional functional deficits). Pt received in recliner and prior to ADL transfers was notified by MD of pending doppler for LLE to r/o DVT.  Pt would benefit from skilled OT services to address noted impairments and functional limitations (see below for any additional details) in order to maximize safety and independence while minimizing falls risk and caregiver burden.    If plan is discharge home, recommend the following:   A lot of help with walking and/or transfers;A lot of help with bathing/dressing/bathroom;Assist for transportation;Assistance with cooking/housework;Help with stairs or ramp for entrance     Functional Status Assessment   Patient has had a recent decline in their functional status and demonstrates the ability to make significant improvements in function in a reasonable and predictable amount of time.     Equipment Recommendations   None recommended by OT     Recommendations for Other Services         Precautions/Restrictions    Precautions Precautions: Fall Restrictions Weight Bearing Restrictions Per Provider Order: No     Mobility Bed Mobility               General bed mobility comments: NT, in recliner    Transfers                   General transfer comment: deferred, MD came in prior to standing attempts from recliner and indicated the need for a doppler to r/o LLE DVT      Balance                                           ADL either performed or assessed with clinical judgement   ADL Overall ADL's : Needs assistance/impaired                                       General ADL Comments: Currently requiring MOD A for ADL transfers, continues to require assist for seated sponge bathing and dressing, and setup for grooming     Vision         Perception         Praxis         Pertinent Vitals/Pain Pain Assessment Pain Assessment: No/denies pain     Extremity/Trunk Assessment Upper Extremity Assessment Upper Extremity Assessment: Generalized weakness   Lower Extremity Assessment Lower Extremity Assessment: Generalized weakness       Communication     Cognition Arousal: Alert Behavior During Therapy: [facility]  for tasks assessed/performed Cognition: No apparent impairments                                       Cueing  General Comments          Exercises     Shoulder Instructions      Home Living Family/patient expects to be discharged to:: Private residence Living Arrangements: Other relatives (sister) Available Help at Discharge: Family;Available 24 hours/day Type of Home: House Home Access: Ramped entrance     Home Layout: One level     Bathroom Shower/Tub: Chief Strategy Officer: Handicapped height Bathroom Accessibility: No   Home Equipment: Scientist, research (medical) (4 wheels);Cane - single Information systems manager (2 wheels)   Additional Comments: Lives with sister with  24/7 supervision between sister; Pt reports not having an aide anymore      Prior Functioning/Environment Prior Level of Function : Needs assist             Mobility Comments: Pt primarily uses w/c and has assist from sister for pivot transfers and able to walk a few feet ADLs Comments: Sister assist with ADLs. Pt reports he uses a BSC for toileting and sister assists him with sponge bathing, as he is unable to access the bathroom in their house.    OT Problem List: Decreased strength;Decreased activity tolerance;Impaired balance (sitting and/or standing);Decreased knowledge of use of DME or AE   OT Treatment/Interventions: Self-care/ADL training;Therapeutic exercise;Therapeutic activities;DME and/or AE instruction;Patient/family education;Balance training      OT Goals(Current goals can be found in the care plan section)   Acute Rehab OT Goals Patient Stated Goal: go home OT Goal Formulation: With patient Time For Goal Achievement: 03/08/23 Potential to Achieve Goals: Good ADL Goals Pt Will Transfer to Toilet: with min assist;bedside commode (step pivot, LRAD) Pt Will Perform Toileting - Clothing Manipulation and hygiene: with modified independence;sitting/lateral leans Additional ADL Goal #1: Pt will verbalize plan to implement at least 1 learned falls prevention strategy.   OT Frequency:  Min 1X/week    Co-evaluation              AM-PAC OT "6 Clicks" Daily Activity     Outcome Measure Help from another person eating meals?: None Help from another person taking care of personal grooming?: None Help from another person toileting, which includes using toliet, bedpan, or urinal?: A Lot Help from another person bathing (including washing, rinsing, drying)?: A Lot Help from another person to put on and taking off regular upper body clothing?: A Little Help from another person to put on and taking off regular lower body clothing?: A Lot 6 Click Score: 17   End of  Session    Activity Tolerance: Patient tolerated treatment well Patient left: in chair;with call bell/phone within reach;with chair alarm set;with nursing/sitter in room  OT Visit Diagnosis: Other abnormalities of gait and mobility (R26.89)                Time: 0981-1914 OT Time Calculation (min): 11 min Charges:  OT General Charges $OT Visit: 1 Visit OT Evaluation $OT Eval Low Complexity: 1 Low  Arman Filter., MPH, MS, OTR/L ascom [phone] 02/22/23, 10:28 AM

## 2023-02-22 NOTE — Discharge Summary (Signed)
Physician Discharge Summary   Patient: Shawn [name] MRN: [id number] DOB: 1955-05-10  Admit date:     02/19/2023  Discharge date: 02/22/23  Discharge Physician: Delfino Lovett   PCP: [facility]   Recommendations at discharge:    F/up with outpt providers as requested  Discharge Diagnoses: Principal Problem:   GI bleed Active Problems:   Acquired hypothyroidism  Hospital Course: Assessment and Plan:  68 y.o. male with medical history significant of ESRD on hemodialysis, hypertension, diabetes mellitus type II, congestive heart failure, coronary artery disease, CVA, alcohol abuse, tobacco abuse and hypothyroidism brought in by EMS for multiple episodes of vomiting.    2/15: Nephro consult.  Urgent HD due to missed dialysis 2/16: GI consult 2/17: HD today, PT, OT eval, semglee 5 units     Hematemesis none observed/reported while here  Stable H&H GI seen, this could be due to Mallory-Weiss tear, signed off PPI twice daily.  No plan for endoscopy at this time   Hyperkalemia with ESRD Resolved with HD   DM type II Acquired hypothyroidism  LLE swelling No DVT on doppler, its chronic per patient       Consultants: Nephro, gi Procedures performed: HD  Disposition: Home health Diet recommendation:  Discharge Diet Orders (From admission, onward)     Start     Ordered   02/22/23 0000  Diet - low sodium heart healthy        02/22/23 0905           Renal diet DISCHARGE MEDICATION: Allergies as of 02/22/2023       Reactions   Empagliflozin    Other Reaction(s): Acidosis   Pravastatin    Other reaction(s): Muscle pain   Simvastatin    Other reaction(s): Muscle pain        Medication List     STOP taking these medications    hydrOXYzine 10 MG tablet Commonly known as: ATARAX       TAKE these medications    acetaminophen 325 MG tablet Commonly known as: TYLENOL Take 2 tablets (650 mg total) by mouth every 6 (six) hours as needed  for mild pain (or Fever >/= 101).   ammonium lactate 12 % lotion Commonly known as: LAC-HYDRIN Apply 1 Application topically as needed for dry skin.   aspirin EC 81 MG tablet Take 81 mg by mouth daily.   atorvastatin 20 MG tablet Commonly known as: LIPITOR Take 1 tablet (20 mg total) by mouth at bedtime. What changed:  medication strength how much to take   atropine 1 % ophthalmic solution Place 1 drop into the right eye 2 (two) times daily.   carvedilol 6.25 MG tablet Commonly known as: Coreg Take 0.5 tablets (3.125 mg total) by mouth 2 (two) times daily. Reduced from 6.25 mg.   cholecalciferol 25 MCG (1000 UNIT) tablet Commonly known as: VITAMIN D3 Take 2,000 Units by mouth daily.   dextrose 40 % Gel Commonly known as: GLUTOSE Take 1 Tube by mouth once as needed for low blood sugar.   feeding supplement (NEPRO CARB STEADY) Liqd Take 237 mLs by mouth 3 (three) times daily between meals.   insulin aspart 100 UNIT/ML FlexPen Commonly known as: NOVOLOG Inject 3 Units into the skin 3 (three) times daily with meals.   insulin glargine-yfgn 100 UNIT/ML injection Commonly known as: SEMGLEE Inject 0.08 mLs (8 Units total) into the skin daily.   midodrine 10 MG tablet Commonly known as: PROAMATINE TAKE ONE TABLET BY MOUTH  MONDAY,WEDNESDAY,FRIDAY BEFORE DIALYSIS   multivitamin with minerals Tabs tablet Take 1 tablet by mouth daily.   omeprazole 40 MG capsule Commonly known as: PRILOSEC Take 40 mg by mouth 2 (two) times daily before a meal. What changed: Another medication with the same name was removed. Continue taking this medication, and follow the directions you see here.   prednisoLONE acetate 1 % ophthalmic suspension Commonly known as: PRED FORTE Place 1 drop into the right eye as directed.   Synthroid 75 MCG tablet Generic drug: levothyroxine Take 75 mcg by mouth daily before breakfast.   tamsulosin 0.4 MG Caps capsule Commonly known as: FLOMAX Take 1  capsule (0.4 mg total) by mouth daily after supper.   torsemide 20 MG tablet Commonly known as: DEMADEX Take 60 mg by mouth as directed. Take 3 tablets (60mg ) by mouth every Sunday, Tuesday, Thursday and Saturday        Follow-up Information     [place], [place], [place]. Go on 03/03/2023.   Specialty: Physician Assistant Why: [facility] Discharge F/UP. Go at 9:30am. Contact information: [address] [place] [address] [phone]         Antonieta Iba, MD. Go on 03/01/2023.   Specialty: Cardiology Why: [facility] Discharge F/UP. Go at 10:00am. Contact information: [address] [place] [address] [phone]                Discharge Exam: Ceasar Mons Weights   02/19/23 1908 02/21/23 0812 02/21/23 1216  Weight: 56.8 kg 58.1 kg 57.1 kg   General exam: Appears calm and comfortable  Respiratory system: Clear to auscultation. Respiratory effort normal. Cardiovascular system: S1 & S2 heard, RRR. No JVD, murmurs, rubs, gallops or clicks. No pedal edema. Gastrointestinal system: Abdomen is soft, benign Central nervous system: Alert and oriented. No focal neurological deficits. Extremities: Symmetric 5 x 5 power. Skin: No rashes, lesions or ulcers Psychiatry: Judgement and insight appear normal. Mood & affect appropriate.  Access: Right chest tunneled dialysis catheter  Condition at discharge: fair  The results of significant diagnostics from this hospitalization (including imaging, microbiology, ancillary and laboratory) are listed below for reference.   Imaging Studies: US Venous Img Lower Unilateral Left (DVT) Result Date: 02/22/2023 CLINICAL DATA:  Left lower extremity edema. EXAM: LEFT LOWER EXTREMITY VENOUS DOPPLER ULTRASOUND TECHNIQUE: Gray-scale sonography with graded compression, as well as color Doppler and duplex ultrasound were performed to evaluate the lower extremity deep venous systems from the level of the common femoral vein and  including the common femoral, femoral, profunda femoral, popliteal and calf veins including the posterior tibial, peroneal and gastrocnemius veins when visible. The superficial great saphenous vein was also interrogated. Spectral Doppler was utilized to evaluate flow at rest and with distal augmentation maneuvers in the common femoral, femoral and popliteal veins. COMPARISON:  None Available. FINDINGS: Contralateral Common Femoral Vein: Respiratory phasicity is normal and symmetric with the symptomatic side. No evidence of thrombus. Normal compressibility. Common Femoral Vein: No evidence of thrombus. Normal compressibility, respiratory phasicity and response to augmentation. Saphenofemoral Junction: No evidence of thrombus. Normal compressibility and flow on color Doppler imaging. Profunda Femoral Vein: No evidence of thrombus. Normal compressibility and flow on color Doppler imaging. Femoral Vein: No evidence of thrombus. Normal compressibility, respiratory phasicity and response to augmentation. Popliteal Vein: No evidence of thrombus. Normal compressibility, respiratory phasicity and response to augmentation. Calf Veins: No evidence of thrombus. Normal compressibility and flow on color Doppler imaging. Superficial Great Saphenous Vein: No evidence of  thrombus. Normal compressibility. Venous Reflux:  None. Other Findings: No evidence of superficial thrombophlebitis. Small fluid collection in the left popliteal fossa measures up to approximately 2.3 cm and is consistent with a Baker's cyst. IMPRESSION: 1. No evidence of left lower extremity deep venous thrombosis. 2. Small left popliteal fossa Baker's cyst. Electronically Signed   By: Irish Lack M.D.   On: 02/22/2023 12:43   DG Chest Port 1 View Result Date: 02/19/2023 CLINICAL DATA:  Shortness of breath and emesis. EXAM: PORTABLE CHEST 1 VIEW COMPARISON:  10/27/2022 FINDINGS: Stable cardiac enlargement and positioning of tunneled dialysis catheter. Left  lower lobe atelectasis and probable trace/small left pleural effusion. Mild atelectasis at the right lung base. No overt pulmonary edema or pneumothorax. The visualized skeletal structures are unremarkable. IMPRESSION: Left lower lobe atelectasis and probable trace/small left pleural effusion. Mild right basilar atelectasis. Electronically Signed   By: Irish Lack M.D.   On: 02/19/2023 09:36    Microbiology: Results for orders placed or performed during the hospital encounter of 11/10/22  Body fluid culture w Gram Stain     Status: None   Collection Time: 11/10/22  4:24 PM   Specimen: PATH Cytology Peritoneal fluid  Result Value Ref Range Status   Specimen Description   Final    PERITONEAL Performed at [facility], [address], [place], [place] [address]    Special Requests   Final    PERITONEAL Performed at [facility], [address] [place], [place] [address]    Gram Stain   Final    WBC PRESENT, PREDOMINANTLY MONONUCLEAR NO ORGANISMS SEEN CYTOSPIN SMEAR    Culture   Final    NO GROWTH 3 DAYS Performed at [facility], [address], [place], [place] [address]    Report Status 11/14/2022 FINAL  Final  Gastrointestinal Panel by PCR , Stool     Status: None   Collection Time: 11/11/22  8:32 AM   Specimen: STOOL  Result Value Ref Range Status   Campylobacter species NOT DETECTED NOT DETECTED Final   Plesimonas shigelloides NOT DETECTED NOT DETECTED Final   Salmonella species NOT DETECTED NOT DETECTED Final   Yersinia enterocolitica NOT DETECTED NOT DETECTED Final   Vibrio species NOT DETECTED NOT DETECTED Final   Vibrio cholerae NOT DETECTED NOT DETECTED Final   Enteroaggregative E coli (EAEC) NOT DETECTED NOT DETECTED Final   Enteropathogenic E coli (EPEC) NOT DETECTED NOT DETECTED Final   Enterotoxigenic E coli (ETEC) NOT DETECTED NOT DETECTED Final   Shiga like toxin producing E coli (STEC) NOT DETECTED NOT DETECTED Final    Shigella/Enteroinvasive E coli (EIEC) NOT DETECTED NOT DETECTED Final   Cryptosporidium NOT DETECTED NOT DETECTED Final   Cyclospora cayetanensis NOT DETECTED NOT DETECTED Final   Entamoeba histolytica NOT DETECTED NOT DETECTED Final   Giardia lamblia NOT DETECTED NOT DETECTED Final   Adenovirus F40/41 NOT DETECTED NOT DETECTED Final   Astrovirus NOT DETECTED NOT DETECTED Final   Norovirus GI/GII NOT DETECTED NOT DETECTED Final   Rotavirus A NOT DETECTED NOT DETECTED Final   Sapovirus (I, II, IV, and V) NOT DETECTED NOT DETECTED Final    Comment: Performed at [facility], [address] [place], [place] [address]  C Difficile Quick Screen w PCR reflex     Status: Abnormal   Collection Time: 11/11/22  8:35 AM   Specimen: STOOL  Result Value Ref Range Status   C Diff antigen POSITIVE (A) NEGATIVE Final  C Diff toxin NEGATIVE NEGATIVE Final   C Diff interpretation Results are indeterminate. See PCR results.  Final    Comment: Performed at [facility], [address] [place], [place] [address]  C. Diff by PCR, Reflexed     Status: Abnormal   Collection Time: 11/11/22  8:35 AM  Result Value Ref Range Status   Toxigenic C. Difficile by PCR POSITIVE (A) NEGATIVE Final    Comment: Positive for toxigenic C. difficile with little to no toxin production. Only treat if clinical presentation suggests symptomatic illness. Performed at [facility], [address] [place], [place] [address]     Labs: CBC: Recent Labs  Lab 02/19/23 0981 02/20/23 1934 02/21/23 0543  WBC 5.0 5.2 4.7  HGB 14.1 11.8* 12.1*  HCT 44.4 36.5* 37.4*  MCV 94.1 94.3 94.2  PLT 216 211 207   Basic Metabolic Panel: Recent Labs  Lab 02/19/23 0852 02/20/23 1150 02/21/23 0543  NA 139 134* 135  K 5.4* 5.1 5.5*  CL 106 98 101  CO2 20* 17* 21*  GLUCOSE 44* 164* 280*  BUN 62* 57* 73*  CREATININE 5.64* 4.34* 5.00*  CALCIUM 8.7* 8.5* 8.4*  PHOS  --   --  7.4*   Liver Function  Tests: Recent Labs  Lab 02/19/23 0852  AST 15  ALT 11  ALKPHOS 78  BILITOT 0.6  PROT 6.8  ALBUMIN 2.7*   CBG: Recent Labs  Lab 02/21/23 1254 02/21/23 1638 02/21/23 2102 02/22/23 0822 02/22/23 1251  GLUCAP 161* 149* 153* 142* 147*    Discharge time spent: greater than 30 minutes.  Signed: Delfino Lovett, MD [facility] 02/22/2023

## 2023-02-22 NOTE — Discharge Instructions (Signed)
Transportation Resources  Agency Name: [facility] Agency Address: [address] [place], [place] [address] Phone: [phone] Email: [email or web address] Website: [email or web address] Service(s) Offered: Housing services, self-sufficiency, congregate meal program, weatherization program, Field seismologist program, emergency food assistance,  housing counseling, home ownership program, wheels-towork program.  Agency Name: [facility] [employer] [phone]) Address: [address], [place], [place] [address] Phone: [phone] Website: [email or web address] Service(s) Offered: Transportation for [employer], subscription and demand response; Dial-a-Ride for citizens 5 years of age or older.  Agency Name: Department of Social Services Address: [address] [place], [place] [address] Phone: [phone] Service(s) Offered: Child support services; child welfare services; food stamps; Medicaid; work first family assistance; and aid with fuel,  rent, food and medicine, transportation assistance.  Agency Name: Disabled [employer] (DAV) Transportation  Network Phone: [phone] Service(s) Offered: Transports veterans to the [facility] medical center. Call  forty-eight hours in advance and leave the name, telephone  number, date, and time of appointment. Veteran will be  contacted by the driver the day before the appointment to  arrange a pick up point   Transportation Resources  Agency Name: [facility] Agency Address: [address] [place], [place] [address] Phone: [phone] Email: [email or web address] Website: [email or web address] Service(s) Offered: Housing services, self-sufficiency, congregate meal program, weatherization program, Field seismologist program, emergency food assistance,  housing counseling, home ownership program, wheels-towork  program.  Agency Name: [facility] [employer] [phone]) Address: [address] [place], [place] [address] Phone: [phone] Website: [email or web address] Service(s) Offered: Transportation for [employer], subscription and demand response; Dial-a-Ride for citizens 72 years of age or older.  Agency Name: Department of Social Services Address: [address] [place], [place] [address] Phone: [phone] Service(s) Offered: Child support services; child welfare services; food stamps; Medicaid; work first family assistance; and aid with fuel,  rent, food and medicine, transportation assistance.  Agency Name: Disabled [employer] (DAV) Transportation  Network Phone: [phone] Service(s) Offered: Transports veterans to the [facility] medical center. Call  forty-eight hours in advance and leave the name, telephone  number, date, and time of appointment. Veteran will be  contacted by the driver the day before the appointment to  arrange a pick up point    [employer] ACTA currently provides door to door services. ACTA connects with PART daily for services to [facility]. ACTA also performs contract services to [employer] operates 27 vehicles, all but 3 mini-vans are equipped with lifts for special needs as well as the general public. ACTA drivers are each CDL certified and trained in First Aid and CPR. ACTA was established in 2002 by [employer]. An independent Industrial/product designer. ACTA operates via Cytogeneticist with required Research scientist (physical sciences) from [employer]. ACTA provides over 80,000 passenger trips each year, including [facility] and [employer] sites.  Call at least by 11 AM one business day prior to needing transportation  [employer].                      [place], [place] [address]     Office  Hours: Monday-Friday  8 AM - 5 PM     Agency Name: [facility] Agency Address: [address] [place], [place] [address] Phone: [phone] Website: [email or web address].[email or web address] Service(s) Offered: Housing services, self-sufficiency, congregate meal program, and individual development account program.  Agency Name: [employer] of  [facility] Address: [address], [place], [place] [address] Phone: [phone] Email: [email or web address] Website: [email or web address] Service(s) Offered: Housing the homeless, feeding the hungry, Company secretary, job and education related services.  Agency Name: [facility] Address: [address] [place], [place] [address] Phone: [phone] Email: [email or web address] Service(s) Offered: Counseling, problem pregnancy, advocacy for Hispanics, limited emergency financial assistance.  Agency Name: Department of Social Services Address: [address] [place], [place] [address] Phone: [phone] Website: [email or web address] Service(s) Offered: Child support services; child welfare services; SNAP; Medicaid; work first family assistance; and aid with fuel,  rent, food and medicine.  Agency Name: Holiday representative Address: [address], [place], [place] [address] Phone: [phone] or [phone] Email: [email or web address] Service(s) Offered: Family services and transient assistance; emergency food, fuel, clothing, limited furniture, utilities; budget counseling, general counseling; give a kid a coat; thrift store; Christmas food and toys. Utility assistance, food pantry, rental  assistance, life sustaining medicine

## 2023-02-22 NOTE — Progress Notes (Signed)
[facility]  ROUNDING NOTE   Subjective:   Shawn [name] is a 68 y.o. male with past medical conditions including end stage renal disease on hemodialysis, hypertension, diabetes mellitus type II, congestive heart failure, coronary artery disease, CVA, alcohol abuse, tobacco abuse and hypothyroidism. He presents to the ED for nausea and vomiting. Patient has been admitted under observation for GI bleed [K92.2] Nausea and vomiting, unspecified vomiting type [R11.2]   Patient is known to our practice and receives outpatient dialysis treatments at [facility] on a MWF schedule, supervised by Dr Thedore Mins.    Patient seen working with PT/OT Denies pain States he is ready for discharge   Objective:  Vital signs in last 24 hours:  Temp:  [97.4 F (36.3 C)-97.8 F (36.6 C)] 97.7 F (36.5 C) (02/18 0752) Pulse Rate:  [61-66] 64 (02/18 0752) Resp:  [13-20] 16 (02/18 0752) BP: (106-122)/(71-89) 122/89 (02/18 0752) SpO2:  [98 %-100 %] 98 % (02/18 0752) Weight:  [57.1 kg] 57.1 kg (02/17 1216)  Weight change:  Filed Weights   02/19/23 1908 02/21/23 0812 02/21/23 1216  Weight: 56.8 kg 58.1 kg 57.1 kg    Intake/Output: I/O last 3 completed shifts: In: -  Out: 1500 [Other:1500]   Intake/Output this shift:  No intake/output data recorded.  Physical Exam: General: NAD  Head: Normocephalic, atraumatic. Moist oral mucosal membranes  Eyes: Anicteric  Lungs:  Clear to auscultation, normal effort  Heart: Regular rate and rhythm  Abdomen:  Soft, nontender, nondistended  Extremities: Trace peripheral edema.  Neurologic: Alert, moving all four extremities  Skin: No lesions  Access: Right chest tunneled catheter    Basic Metabolic Panel: Recent Labs  Lab 02/19/23 0852 02/20/23 1150 02/21/23 0543  NA 139 134* 135  K 5.4* 5.1 5.5*  CL 106 98 101  CO2 20* 17* 21*  GLUCOSE 44* 164* 280*  BUN 62* 57* 73*  CREATININE 5.64* 4.34* 5.00*  CALCIUM 8.7* 8.5* 8.4*   PHOS  --   --  7.4*    Liver Function Tests: Recent Labs  Lab 02/19/23 0852  AST 15  ALT 11  ALKPHOS 78  BILITOT 0.6  PROT 6.8  ALBUMIN 2.7*   Recent Labs  Lab 02/19/23 0852  LIPASE 17   No results for input(s): "AMMONIA" in the last 168 hours.  CBC: Recent Labs  Lab 02/19/23 0852 02/20/23 1934 02/21/23 0543  WBC 5.0 5.2 4.7  HGB 14.1 11.8* 12.1*  HCT 44.4 36.5* 37.4*  MCV 94.1 94.3 94.2  PLT 216 211 207    Cardiac Enzymes: No results for input(s): "CKTOTAL", "CKMB", "CKMBINDEX", "TROPONINI" in the last 168 hours.  BNP: Invalid input(s): "POCBNP"  CBG: Recent Labs  Lab 02/21/23 0736 02/21/23 1254 02/21/23 1638 02/21/23 2102 02/22/23 0822  GLUCAP 312* 161* 149* 153* 142*    Microbiology: Results for orders placed or performed during the hospital encounter of 11/10/22  Body fluid culture w Gram Stain     Status: None   Collection Time: 11/10/22  4:24 PM   Specimen: PATH Cytology Peritoneal fluid  Result Value Ref Range Status   Specimen Description   Final    PERITONEAL Performed at [facility], [address] [place], [place] [address]    Special Requests   Final    PERITONEAL Performed at [facility], [address] [place], [place] [address]    Gram Stain   Final    WBC PRESENT, PREDOMINANTLY MONONUCLEAR NO ORGANISMS SEEN CYTOSPIN SMEAR  Culture   Final    NO GROWTH 3 DAYS Performed at [facility], [address], [place], [place] [address]    Report Status 11/14/2022 FINAL  Final  Gastrointestinal Panel by PCR , Stool     Status: None   Collection Time: 11/11/22  8:32 AM   Specimen: STOOL  Result Value Ref Range Status   Campylobacter species NOT DETECTED NOT DETECTED Final   Plesimonas shigelloides NOT DETECTED NOT DETECTED Final   Salmonella species NOT DETECTED NOT DETECTED Final   Yersinia enterocolitica NOT DETECTED NOT DETECTED Final   Vibrio species NOT DETECTED NOT DETECTED Final   Vibrio  cholerae NOT DETECTED NOT DETECTED Final   Enteroaggregative E coli (EAEC) NOT DETECTED NOT DETECTED Final   Enteropathogenic E coli (EPEC) NOT DETECTED NOT DETECTED Final   Enterotoxigenic E coli (ETEC) NOT DETECTED NOT DETECTED Final   Shiga like toxin producing E coli (STEC) NOT DETECTED NOT DETECTED Final   Shigella/Enteroinvasive E coli (EIEC) NOT DETECTED NOT DETECTED Final   Cryptosporidium NOT DETECTED NOT DETECTED Final   Cyclospora cayetanensis NOT DETECTED NOT DETECTED Final   Entamoeba histolytica NOT DETECTED NOT DETECTED Final   Giardia lamblia NOT DETECTED NOT DETECTED Final   Adenovirus F40/41 NOT DETECTED NOT DETECTED Final   Astrovirus NOT DETECTED NOT DETECTED Final   Norovirus GI/GII NOT DETECTED NOT DETECTED Final   Rotavirus A NOT DETECTED NOT DETECTED Final   Sapovirus (I, II, IV, and V) NOT DETECTED NOT DETECTED Final    Comment: Performed at [facility], [address] [place], [place] [address]  C Difficile Quick Screen w PCR reflex     Status: Abnormal   Collection Time: 11/11/22  8:35 AM   Specimen: STOOL  Result Value Ref Range Status   C Diff antigen POSITIVE (A) NEGATIVE Final   C Diff toxin NEGATIVE NEGATIVE Final   C Diff interpretation Results are indeterminate. See PCR results.  Final    Comment: Performed at [facility], [address] [place], [place] [address]  C. Diff by PCR, Reflexed     Status: Abnormal   Collection Time: 11/11/22  8:35 AM  Result Value Ref Range Status   Toxigenic C. Difficile by PCR POSITIVE (A) NEGATIVE Final    Comment: Positive for toxigenic C. difficile with little to no toxin production. Only treat if clinical presentation suggests symptomatic illness. Performed at [facility], [address] [place], [place] [address]     Coagulation Studies: No results for input(s): "LABPROT", "INR" in the last 72 hours.   Urinalysis: No results for input(s): "COLORURINE", "LABSPEC", "PHURINE",  "GLUCOSEU", "HGBUR", "BILIRUBINUR", "KETONESUR", "PROTEINUR", "UROBILINOGEN", "NITRITE", "LEUKOCYTESUR" in the last 72 hours.  Invalid input(s): "APPERANCEUR"    Imaging: No results found.    Medications:     atropine  1 drop Right Eye BID   Chlorhexidine Gluconate Cloth  6 each Topical Q0600   cholecalciferol  2,000 Units Oral Daily   feeding supplement (NEPRO CARB STEADY)  237 mL Oral TID BM   insulin aspart  0-5 Units Subcutaneous QHS   insulin aspart  0-6 Units Subcutaneous TID WC   insulin glargine-yfgn  5 Units Subcutaneous Daily   levothyroxine  75 mcg Oral QAC breakfast   mirtazapine  7.5 mg Oral QHS   multivitamin with minerals  1 tablet Oral Daily   pantoprazole  40 mg Oral Daily   prednisoLONE acetate  1 drop Right Eye UD   tamsulosin  0.4 mg Oral QPC supper   acetaminophen, dextrose, hydrOXYzine, mouth rinse  Assessment/ Plan:  Shawn [name] is a 68 y.o.  male is a 68 y.o. male with past medical conditions including end stage renal disease on hemodialysis, hypertension, diabetes mellitus type II, congestive heart failure, coronary artery disease, CVA, alcohol abuse, tobacco abuse and hypothyroidism. He presents to the ED for nausea and vomiting. Patient has been admitted under observation for GI bleed [K92.2] Nausea and vomiting, unspecified vomiting type [R11.2]  [facility]/195 minutes / 60.5 kg/right IJ PermCath/MWF   Hyperkalemia with end-stage renal disease on hemodialysis.  Potassium corrected with dialysis. Dialysis received yesterday, UF 1.5L achieved.  Next treatment scheduled for Wednesday.  2.  Anemia with chronic kidney disease Hemoglobin & Hematocrit     Component Value Date/Time   HGB 12.1 (L) 02/21/2023 0543   HGB 15.7 06/18/2013 0427   HCT 37.4 (L) 02/21/2023 0543   HCT 46.5 06/18/2013 0427  Reports hemoptysis prior to admission.  No episodes since admission.  Hgb remains stable    3. Secondary Hyperparathyroidism:  with outpatient labs: PTH 346, phosphorus 5.5, calcium 8.6 on 01/17/23.   Lab Results  Component Value Date   CALCIUM 8.4 (L) 02/21/2023   PHOS 7.4 (H) 02/21/2023    Calcium remains within desired range. Hyperphosphatemia, no binders prescribed outpatient. Will monitor for now.    4. Diabetes mellitus type II with chronic kidney disease/renal manifestations: insulin dependent. Home regimen includes NovoLog and glargine. Most recent hemoglobin A1c is 9.1 on 12/27/22.   Glucose elevated at times. Sliding scale insulin managed by primary team..    LOS: 3 Adael Culbreath 2/18/202512:09 PM

## 2023-02-27 NOTE — Progress Notes (Unsigned)
 Cardiology Clinic Note   Date: 03/01/2023 ID: MILBERN DOESCHER, DOB August 26, 1955, MRN [id number]  Primary Cardiologist:  Julien Nordmann, MD  Chief Complaint   Shawn [name] is a 68 y.o. male who presents to the clinic today for overdue follow up.   Patient Profile   Shawn [name] is followed by Dr. Mariah Milling for the history outlined below.      Past medical history significant for: Chronic HFrEF/nonischemic cardiomyopathy. Echo 06/26/2021: EF 25 to 30%.  Global hypokinesis.  Left ventricular internal cavity mildly dilated.  Grade II DD.  Normal RV size/function.  Mild LAE.  Small circumferential pericardial effusion without evidence of cardiac tamponade.  Moderate pleural effusion in the left lateral region.  Moderate MR.  Dilated IVC, RA pressure 8 mmHg. RHC 06/29/2021: Moderately elevated left heart filling pressure.  Moderate to severely elevated right heart pulmonary artery pressures.  Normal Fick cardiac output with low central venous oxygen saturation. Hypertension. Hyperlipidemia. CVA. T2DM. Hypothyroidism. ESRD. On HD MWF. Former alcohol abuse. Tobacco abuse.  In summary, patient has a history of nonischemic cardiomyopathy with diagnostic LHC performed at an outside facility in 2015 showing no evidence of CAD.  Echo at that time showed EF <20%.  He established care with Dr. Mariah Milling in April 2019 during hospital admission.  Echo at that time showed EF 20 to 25%.  Repeat echo May 2020 showed EF 25 to 30%.  He was lost to follow-up after June 2021.  He underwent hospital admission in January 2023 for acute confusion thought to be related to hypoglycemia and UTI.  Echo at that time showed EF 40 to 45%, global hypokinesis, severe LVH, Grade I DD, normal RV size/function, small circumferential pericardial effusion, showed a speckled appearing myocardium with severe LVH, mild valve thickening, small pericardial effusion suggestive of cardiac amyloidosis.  Upon review of echo it  was felt findings were inconsistent with cardiac amyloidosis and outpatient PYP scan was recommended.  GDMT was restarted.  Patient was not seen for outpatient follow-up.  He underwent hospital admission in June 2023 for volume overload.  He was diuresed with IV Lasix.  Echo at that time showed EF 25 to 30% as detailed above.  He underwent right heart catheterization showing elevated filling pressures as detailed above.  He was not seen for outpatient follow-up.  Patient had 2 hospital admission at the end of 2024.  In October he was admitted for DKA and in November for volume overload and C. difficile.  Most recently, patient presented to the ED on 02/19/2023 with nausea and vomiting.  He had missed dialysis the day prior.  He reported noting dark-colored blood in emesis.  He was admitted for further evaluation.  He was evaluated by GI who felt his clinical presentation was consistent with Mallory-Weiss tear and ongoing GI bleeding was not suspected.  Hemoglobin remained stable throughout hospital admission.  Patient was discharged on 02/22/2023.     History of Present Illness    Today, patient is accompanied by his daughter and nephew. He reports he is doing well. Patient denies shortness of breath, dyspnea on exertion,  orthopnea or PND. No chest pain, pressure, or tightness. No palpitations.  He has chronic left ankle edema that is best in the morning and progresses throughout the day. He will also get some edema in the right ankle. He takes torsemide on the days he does not undergo dialysis with brisk diuresis. He also reports limiting his fluid intake. He has been adherent to his  dialysis schedule. He is taking midodrine on dialysis days and has not had an issue with hypotension. His biggest complaint today is leg weakness. His daughter reports prior to his multiple hospitalizations over the last year he was independent at home and driving himself to appointments. Now he is mostly wheelchair bound.   They are awaiting home PT set up through the [place]. PT was supposed to come out to his house last week but due to the snow it had to be rescheduled all the way out to March.      ROS: All other systems reviewed and are otherwise negative except as noted in History of Present Illness.  EKGs/Labs Reviewed        02/19/2023: ALT 11; AST 15 02/21/2023: BUN 73; Creatinine, Ser 5.00; Potassium 5.5; Sodium 135   02/21/2023: Hemoglobin 12.1; WBC 4.7   11/10/2022: TSH 3.093   09/03/2022: B Natriuretic Peptide >4,500.0    Physical Exam    VS:  BP 119/75   Pulse (!) 55   Ht 5\' 11"  (1.803 m)   Wt 147 lb (66.7 kg)   SpO2 93%   BMI 20.50 kg/m  , BMI Body mass index is 20.5 kg/m.  GEN: Well nourished, well developed, in no acute distress. Neck: No JVD or carotid bruits. Cardiac:  RRR. No murmurs. No rubs or gallops.   Respiratory:  Respirations regular and unlabored. Diminished breath sounds bilaterally without rales, wheezing or rhonchi. GI: Soft, nontender, nondistended. Extremities: Radials/DP/PT 2+ and equal bilaterally. No clubbing or cyanosis. 1+ pitting edema left ankle, no edema right ankle.   Skin: Warm and dry, no rash. Neuro: Strength intact.  Assessment & Plan   Chronic HFrEF/nonischemic cardiomyopathy LHC performed at an outside facility in 2015 showed no evidence of CAD.  Echo June 2023 showed EF 25 to 30%, global hypokinesis, grade 2 DD, normal RV size/function, mild LAE, small circumferential pericardial effusion without evidence of cardiac tamponade, moderate MR.  RHC June 2023 showed moderately elevated left heart filling pressure, moderate to severely elevated right heart pulmonary artery pressures, normal Fick cardiac output with low central venous oxygen saturation.  Patient denies shortness of breath, DOE, orthopnea or PND. He has chronic left ankle edema that is best in the morning and progresses throughout the day. He reports brisk edema on current dose of torsemide  (taking on days he does not do dialysis).   -Volume managed by nephrology/HD. -Continue torsemide, carvedilol. -Schedule echo. -Refer to advanced heart failure clinic.   Hypertension BP today 119/75. No dizziness or headaches.  -Continue carvedilol.  ESRD On hemodialysis MWF.  Patient reports adherence to dialysis schedule. He reports limiting fluid intake. Patient reports taking midodrine on dialysis days and no recent episodes of hypotension after dialysis.  -Continue adherence to hemodialysis schedule. -Continue to follow with nephrology.  Tobacco abuse Patient reports he smokes 8-10 cigarettes a day. Family indicates he smokes more than that.  -Cessation is encouraged.   Disposition: Echo. Refer to advanced heart failure clinic. Return in 6 months or sooner as needed.          Signed, Etta Grandchild. Mairim Bade, DNP, NP-C

## 2023-03-01 ENCOUNTER — Encounter: Payer: Self-pay | Admitting: Student

## 2023-03-01 ENCOUNTER — Ambulatory Visit: Payer: 59 | Attending: Student | Admitting: Student

## 2023-03-01 ENCOUNTER — Other Ambulatory Visit
Admission: RE | Admit: 2023-03-01 | Discharge: 2023-03-01 | Disposition: A | Discharge status: Home / Self Care | Payer: 59 | Source: Ambulatory Visit | Attending: Nephrology | Admitting: Nephrology

## 2023-03-01 VITALS — BP 119/75 | HR 55 | Ht 71.0 in | Wt 147.0 lb

## 2023-03-01 DIAGNOSIS — I5022 Chronic systolic (congestive) heart failure: Secondary | ICD-10-CM

## 2023-03-01 DIAGNOSIS — I428 Other cardiomyopathies: Secondary | ICD-10-CM | POA: Diagnosis not present

## 2023-03-01 DIAGNOSIS — I10 Essential (primary) hypertension: Secondary | ICD-10-CM

## 2023-03-01 DIAGNOSIS — N186 End stage renal disease: Secondary | ICD-10-CM | POA: Diagnosis not present

## 2023-03-01 DIAGNOSIS — R197 Diarrhea, unspecified: Secondary | ICD-10-CM | POA: Diagnosis present

## 2023-03-01 DIAGNOSIS — Z720 Tobacco use: Secondary | ICD-10-CM

## 2023-03-01 DIAGNOSIS — Z992 Dependence on renal dialysis: Secondary | ICD-10-CM

## 2023-03-01 LAB — C DIFFICILE QUICK SCREEN W PCR REFLEX
C Diff antigen: POSITIVE — AB
C Diff interpretation: DETECTED
C Diff toxin: POSITIVE — AB

## 2023-03-01 NOTE — Patient Instructions (Signed)
 Medication Instructions:  No changes at this time.   *If you need a refill on your cardiac medications before your next appointment, please call your pharmacy*   Lab Work: None  If you have labs (blood work) drawn today and your tests are completely normal, you will receive your results only by: MyChart Message (if you have MyChart) OR A paper copy in the mail If you have any lab test that is abnormal or we need to change your treatment, we will call you to review the results.   Testing/Procedures: Your physician has requested that you have an echocardiogram. Echocardiography is a painless test that uses sound waves to create images of your heart. It provides your doctor with information about the size and shape of your heart and how well your heart's chambers and valves are working. This procedure takes approximately one hour. There are no restrictions for this procedure. Please do NOT wear cologne, perfume, aftershave, or lotions (deodorant is allowed). Please arrive 15 minutes prior to your appointment time.  Please note: We ask at that you not bring children with you during ultrasound (echo/ vascular) testing. Due to room size and safety concerns, children are not allowed in the ultrasound rooms during exams. Our front office staff cannot provide observation of children in our lobby area while testing is being conducted. An adult accompanying a patient to their appointment will only be allowed in the ultrasound room at the discretion of the ultrasound technician under special circumstances. We apologize for any inconvenience.    Follow-Up: At [facility], you and your health needs are our priority.  As part of our continuing mission to provide you with exceptional heart care, we have created designated Provider Care Teams.  These Care Teams include your primary Cardiologist (physician) and Advanced Practice Providers (APPs -  Physician Assistants and Nurse Practitioners) who  all work together to provide you with the care you need, when you need it.   Your next appointment:   6 month(s)  Provider:   Julien Nordmann, MD or Carlos Levering, NP   Other Instructions Referral to Advanced Heart Failure Clinic [phone]

## 2023-03-05 LAB — STOOL CULTURE REFLEX - CMPCXR

## 2023-03-05 LAB — STOOL CULTURE REFLEX - RSASHR

## 2023-03-05 LAB — STOOL CULTURE: E coli, Shiga toxin Assay: NEGATIVE

## 2023-03-18 ENCOUNTER — Encounter: Payer: Self-pay | Admitting: Emergency Medicine

## 2023-03-18 ENCOUNTER — Inpatient Hospital Stay

## 2023-03-18 ENCOUNTER — Other Ambulatory Visit: Payer: Self-pay

## 2023-03-18 ENCOUNTER — Emergency Department

## 2023-03-18 ENCOUNTER — Inpatient Hospital Stay
Admission: EM | Admit: 2023-03-18 | Discharge: 2023-04-05 | DRG: 291 | Disposition: E | Attending: Internal Medicine | Admitting: Internal Medicine

## 2023-03-18 DIAGNOSIS — I132 Hypertensive heart and chronic kidney disease with heart failure and with stage 5 chronic kidney disease, or end stage renal disease: Principal | ICD-10-CM | POA: Diagnosis present

## 2023-03-18 DIAGNOSIS — Z91199 Patient's noncompliance with other medical treatment and regimen due to unspecified reason: Secondary | ICD-10-CM

## 2023-03-18 DIAGNOSIS — E8809 Other disorders of plasma-protein metabolism, not elsewhere classified: Secondary | ICD-10-CM | POA: Diagnosis present

## 2023-03-18 DIAGNOSIS — I493 Ventricular premature depolarization: Secondary | ICD-10-CM | POA: Diagnosis present

## 2023-03-18 DIAGNOSIS — I5043 Acute on chronic combined systolic (congestive) and diastolic (congestive) heart failure: Secondary | ICD-10-CM | POA: Diagnosis present

## 2023-03-18 DIAGNOSIS — Z79899 Other long term (current) drug therapy: Secondary | ICD-10-CM

## 2023-03-18 DIAGNOSIS — E039 Hypothyroidism, unspecified: Secondary | ICD-10-CM | POA: Diagnosis present

## 2023-03-18 DIAGNOSIS — R64 Cachexia: Secondary | ICD-10-CM | POA: Diagnosis present

## 2023-03-18 DIAGNOSIS — E1122 Type 2 diabetes mellitus with diabetic chronic kidney disease: Secondary | ICD-10-CM | POA: Diagnosis present

## 2023-03-18 DIAGNOSIS — J9811 Atelectasis: Secondary | ICD-10-CM | POA: Diagnosis present

## 2023-03-18 DIAGNOSIS — N2581 Secondary hyperparathyroidism of renal origin: Secondary | ICD-10-CM | POA: Diagnosis present

## 2023-03-18 DIAGNOSIS — I5021 Acute systolic (congestive) heart failure: Secondary | ICD-10-CM | POA: Diagnosis not present

## 2023-03-18 DIAGNOSIS — Z5982 Transportation insecurity: Secondary | ICD-10-CM

## 2023-03-18 DIAGNOSIS — E16A3 Hypoglycemia level 3: Secondary | ICD-10-CM | POA: Diagnosis not present

## 2023-03-18 DIAGNOSIS — R188 Other ascites: Secondary | ICD-10-CM | POA: Diagnosis present

## 2023-03-18 DIAGNOSIS — E1151 Type 2 diabetes mellitus with diabetic peripheral angiopathy without gangrene: Secondary | ICD-10-CM | POA: Diagnosis present

## 2023-03-18 DIAGNOSIS — R1030 Lower abdominal pain, unspecified: Secondary | ICD-10-CM | POA: Diagnosis present

## 2023-03-18 DIAGNOSIS — I5023 Acute on chronic systolic (congestive) heart failure: Secondary | ICD-10-CM | POA: Diagnosis not present

## 2023-03-18 DIAGNOSIS — N186 End stage renal disease: Secondary | ICD-10-CM

## 2023-03-18 DIAGNOSIS — Z515 Encounter for palliative care: Secondary | ICD-10-CM | POA: Diagnosis not present

## 2023-03-18 DIAGNOSIS — T502X5A Adverse effect of carbonic-anhydrase inhibitors, benzothiadiazides and other diuretics, initial encounter: Secondary | ICD-10-CM | POA: Diagnosis present

## 2023-03-18 DIAGNOSIS — E8770 Fluid overload, unspecified: Secondary | ICD-10-CM | POA: Diagnosis present

## 2023-03-18 DIAGNOSIS — D631 Anemia in chronic kidney disease: Secondary | ICD-10-CM | POA: Diagnosis present

## 2023-03-18 DIAGNOSIS — I700 Atherosclerosis of aorta: Secondary | ICD-10-CM | POA: Diagnosis present

## 2023-03-18 DIAGNOSIS — I3139 Other pericardial effusion (noninflammatory): Secondary | ICD-10-CM | POA: Diagnosis present

## 2023-03-18 DIAGNOSIS — J918 Pleural effusion in other conditions classified elsewhere: Secondary | ICD-10-CM | POA: Diagnosis present

## 2023-03-18 DIAGNOSIS — R112 Nausea with vomiting, unspecified: Secondary | ICD-10-CM | POA: Diagnosis not present

## 2023-03-18 DIAGNOSIS — Z79890 Hormone replacement therapy (postmenopausal): Secondary | ICD-10-CM

## 2023-03-18 DIAGNOSIS — R531 Weakness: Secondary | ICD-10-CM | POA: Diagnosis present

## 2023-03-18 DIAGNOSIS — K7460 Unspecified cirrhosis of liver: Secondary | ICD-10-CM | POA: Diagnosis present

## 2023-03-18 DIAGNOSIS — Z8673 Personal history of transient ischemic attack (TIA), and cerebral infarction without residual deficits: Secondary | ICD-10-CM

## 2023-03-18 DIAGNOSIS — E785 Hyperlipidemia, unspecified: Secondary | ICD-10-CM | POA: Diagnosis present

## 2023-03-18 DIAGNOSIS — A0472 Enterocolitis due to Clostridium difficile, not specified as recurrent: Secondary | ICD-10-CM | POA: Diagnosis present

## 2023-03-18 DIAGNOSIS — Z794 Long term (current) use of insulin: Secondary | ICD-10-CM

## 2023-03-18 DIAGNOSIS — Z789 Other specified health status: Secondary | ICD-10-CM

## 2023-03-18 DIAGNOSIS — Z8249 Family history of ischemic heart disease and other diseases of the circulatory system: Secondary | ICD-10-CM

## 2023-03-18 DIAGNOSIS — Z7982 Long term (current) use of aspirin: Secondary | ICD-10-CM

## 2023-03-18 DIAGNOSIS — I959 Hypotension, unspecified: Secondary | ICD-10-CM | POA: Diagnosis present

## 2023-03-18 DIAGNOSIS — R197 Diarrhea, unspecified: Secondary | ICD-10-CM | POA: Diagnosis not present

## 2023-03-18 DIAGNOSIS — E43 Unspecified severe protein-calorie malnutrition: Secondary | ICD-10-CM | POA: Diagnosis present

## 2023-03-18 DIAGNOSIS — L89151 Pressure ulcer of sacral region, stage 1: Secondary | ICD-10-CM | POA: Diagnosis present

## 2023-03-18 DIAGNOSIS — I428 Other cardiomyopathies: Secondary | ICD-10-CM | POA: Diagnosis present

## 2023-03-18 DIAGNOSIS — Z66 Do not resuscitate: Secondary | ICD-10-CM | POA: Diagnosis present

## 2023-03-18 DIAGNOSIS — E11649 Type 2 diabetes mellitus with hypoglycemia without coma: Secondary | ICD-10-CM | POA: Diagnosis not present

## 2023-03-18 DIAGNOSIS — Z992 Dependence on renal dialysis: Secondary | ICD-10-CM | POA: Diagnosis not present

## 2023-03-18 DIAGNOSIS — I2510 Atherosclerotic heart disease of native coronary artery without angina pectoris: Secondary | ICD-10-CM | POA: Diagnosis present

## 2023-03-18 DIAGNOSIS — Z91158 Patient's noncompliance with renal dialysis for other reason: Secondary | ICD-10-CM

## 2023-03-18 DIAGNOSIS — Z833 Family history of diabetes mellitus: Secondary | ICD-10-CM

## 2023-03-18 DIAGNOSIS — Z5986 Financial insecurity: Secondary | ICD-10-CM

## 2023-03-18 DIAGNOSIS — Z681 Body mass index (BMI) 19 or less, adult: Secondary | ICD-10-CM

## 2023-03-18 DIAGNOSIS — K4020 Bilateral inguinal hernia, without obstruction or gangrene, not specified as recurrent: Secondary | ICD-10-CM | POA: Diagnosis present

## 2023-03-18 DIAGNOSIS — F17210 Nicotine dependence, cigarettes, uncomplicated: Secondary | ICD-10-CM | POA: Diagnosis present

## 2023-03-18 DIAGNOSIS — E875 Hyperkalemia: Secondary | ICD-10-CM | POA: Diagnosis present

## 2023-03-18 DIAGNOSIS — E119 Type 2 diabetes mellitus without complications: Secondary | ICD-10-CM

## 2023-03-18 DIAGNOSIS — Z7189 Other specified counseling: Secondary | ICD-10-CM | POA: Diagnosis not present

## 2023-03-18 DIAGNOSIS — E876 Hypokalemia: Secondary | ICD-10-CM | POA: Diagnosis not present

## 2023-03-18 LAB — COMPREHENSIVE METABOLIC PANEL
ALT: 37 U/L (ref 0–44)
AST: 67 U/L — ABNORMAL HIGH (ref 15–41)
Albumin: 2.5 g/dL — ABNORMAL LOW (ref 3.5–5.0)
Alkaline Phosphatase: 118 U/L (ref 38–126)
Anion gap: 14 (ref 5–15)
BUN: 81 mg/dL — ABNORMAL HIGH (ref 8–23)
CO2: 24 mmol/L (ref 22–32)
Calcium: 8.8 mg/dL — ABNORMAL LOW (ref 8.9–10.3)
Chloride: 100 mmol/L (ref 98–111)
Creatinine, Ser: 5.04 mg/dL — ABNORMAL HIGH (ref 0.61–1.24)
GFR, Estimated: 12 mL/min — ABNORMAL LOW (ref >60)
Glucose, Bld: 172 mg/dL — ABNORMAL HIGH (ref 70–99)
Potassium: 4.5 mmol/L (ref 3.5–5.1)
Sodium: 138 mmol/L (ref 135–145)
Total Bilirubin: 0.7 mg/dL (ref 0.0–1.2)
Total Protein: 6.4 g/dL — ABNORMAL LOW (ref 6.5–8.1)

## 2023-03-18 LAB — CBC WITH DIFFERENTIAL/PLATELET
Abs Immature Granulocytes: 0.01 10*3/uL (ref 0.00–0.07)
Basophils Absolute: 0 10*3/uL (ref 0.0–0.1)
Basophils Relative: 1 %
Eosinophils Absolute: 0 10*3/uL (ref 0.0–0.5)
Eosinophils Relative: 0 %
HCT: 40.8 % (ref 39.0–52.0)
Hemoglobin: 13.3 g/dL (ref 13.0–17.0)
Immature Granulocytes: 0 %
Lymphocytes Relative: 13 %
Lymphs Abs: 0.5 10*3/uL — ABNORMAL LOW (ref 0.7–4.0)
MCH: 30.4 pg (ref 26.0–34.0)
MCHC: 32.6 g/dL (ref 30.0–36.0)
MCV: 93.2 fL (ref 80.0–100.0)
Monocytes Absolute: 0.3 10*3/uL (ref 0.1–1.0)
Monocytes Relative: 7 %
Neutro Abs: 3.4 10*3/uL (ref 1.7–7.7)
Neutrophils Relative %: 79 %
Platelets: 154 10*3/uL (ref 150–400)
RBC: 4.38 MIL/uL (ref 4.22–5.81)
RDW: 15.5 % (ref 11.5–15.5)
WBC: 4.2 10*3/uL (ref 4.0–10.5)
nRBC: 0 % (ref 0.0–0.2)

## 2023-03-18 LAB — CBC
HCT: 41.9 % (ref 39.0–52.0)
Hemoglobin: 13.6 g/dL (ref 13.0–17.0)
MCH: 29.9 pg (ref 26.0–34.0)
MCHC: 32.5 g/dL (ref 30.0–36.0)
MCV: 92.1 fL (ref 80.0–100.0)
Platelets: 159 10*3/uL (ref 150–400)
RBC: 4.55 MIL/uL (ref 4.22–5.81)
RDW: 15.7 % — ABNORMAL HIGH (ref 11.5–15.5)
WBC: 3.3 10*3/uL — ABNORMAL LOW (ref 4.0–10.5)
nRBC: 0 % (ref 0.0–0.2)

## 2023-03-18 LAB — CBG MONITORING, ED
Glucose-Capillary: 146 mg/dL — ABNORMAL HIGH (ref 70–99)
Glucose-Capillary: 31 mg/dL — CL (ref 70–99)
Glucose-Capillary: 113 mg/dL — ABNORMAL HIGH (ref 70–99)
Glucose-Capillary: 114 mg/dL — ABNORMAL HIGH (ref 70–99)

## 2023-03-18 LAB — IRON AND TIBC
Iron: 55 ug/dL (ref 45–182)
Saturation Ratios: 32 % (ref 17.9–39.5)
TIBC: 171 ug/dL — ABNORMAL LOW (ref 250–450)
UIBC: 116 ug/dL

## 2023-03-18 LAB — PHOSPHORUS: Phosphorus: 5.1 mg/dL — ABNORMAL HIGH (ref 2.5–4.6)

## 2023-03-18 LAB — HEMOGLOBIN A1C
Hgb A1c MFr Bld: 9.9 % — ABNORMAL HIGH (ref 4.8–5.6)
Mean Plasma Glucose: 237.43 mg/dL

## 2023-03-18 LAB — MAGNESIUM: Magnesium: 1.9 mg/dL (ref 1.7–2.4)

## 2023-03-18 LAB — LIPASE, BLOOD: Lipase: 21 U/L (ref 11–51)

## 2023-03-18 LAB — FOLATE: Folate: 13.8 ng/mL (ref >5.9)

## 2023-03-18 MED ORDER — FUROSEMIDE 10 MG/ML IJ SOLN
40.0000 mg | Freq: Two times a day (BID) | INTRAMUSCULAR | Status: DC
  Administered 2023-03-18: 40 mg via INTRAVENOUS
  Filled 2023-03-18: qty 4

## 2023-03-18 MED ORDER — SODIUM CHLORIDE 0.9% FLUSH
3.0000 mL | Freq: Two times a day (BID) | INTRAVENOUS | Status: DC
  Administered 2023-03-18 – 2023-03-25 (×10): 3 mL via INTRAVENOUS

## 2023-03-18 MED ORDER — ONDANSETRON HCL 4 MG PO TABS: 4.0000 mg | ORAL_TABLET | Freq: Four times a day (QID) | ORAL | Status: DC | PRN

## 2023-03-18 MED ORDER — IPRATROPIUM-ALBUTEROL 0.5-2.5 (3) MG/3ML IN SOLN: 3.0000 mL | Freq: Four times a day (QID) | RESPIRATORY_TRACT | Status: DC | PRN

## 2023-03-18 MED ORDER — MIDODRINE HCL 5 MG PO TABS
10.0000 mg | ORAL_TABLET | Freq: Three times a day (TID) | ORAL | Status: DC
  Administered 2023-03-18 – 2023-03-26 (×20): 10 mg via ORAL
  Filled 2023-03-18 (×22): qty 2

## 2023-03-18 MED ORDER — IOHEXOL 300 MG/ML  SOLN
75.0000 mL | Freq: Once | INTRAMUSCULAR | Status: AC | PRN
  Administered 2023-03-18: 75 mL via INTRAVENOUS

## 2023-03-18 MED ORDER — ALBUMIN HUMAN 25 % IV SOLN
25.0000 g | Freq: Once | INTRAVENOUS | Status: AC
  Administered 2023-03-20: 25 g via INTRAVENOUS
  Filled 2023-03-18: qty 100

## 2023-03-18 MED ORDER — SODIUM CHLORIDE 0.9% FLUSH: 3.0000 mL | INTRAVENOUS | Status: DC | PRN

## 2023-03-18 MED ORDER — PANTOPRAZOLE SODIUM 40 MG PO TBEC
40.0000 mg | DELAYED_RELEASE_TABLET | Freq: Two times a day (BID) | ORAL | Status: DC
  Administered 2023-03-18 – 2023-03-26 (×16): 40 mg via ORAL
  Filled 2023-03-18 (×17): qty 1

## 2023-03-18 MED ORDER — ATORVASTATIN CALCIUM 20 MG PO TABS: 20.0000 mg | ORAL_TABLET | Freq: Every day | ORAL | Status: DC

## 2023-03-18 MED ORDER — ONDANSETRON HCL 4 MG/2ML IJ SOLN
4.0000 mg | Freq: Four times a day (QID) | INTRAMUSCULAR | Status: DC | PRN
  Administered 2023-03-27: 4 mg via INTRAVENOUS
  Filled 2023-03-18: qty 2

## 2023-03-18 MED ORDER — TAMSULOSIN HCL 0.4 MG PO CAPS: 0.4000 mg | ORAL_CAPSULE | Freq: Every day | ORAL | Status: DC

## 2023-03-18 MED ORDER — DEXTROSE 50 % IV SOLN
25.0000 g | Freq: Once | INTRAVENOUS | Status: AC
  Administered 2023-03-18: 25 g via INTRAVENOUS

## 2023-03-18 MED ORDER — INSULIN ASPART 100 UNIT/ML IJ SOLN
0.0000 [IU] | Freq: Three times a day (TID) | INTRAMUSCULAR | Status: DC
  Administered 2023-03-20: 2 [IU] via SUBCUTANEOUS
  Administered 2023-03-20: 1 [IU] via SUBCUTANEOUS
  Administered 2023-03-20: 3 [IU] via SUBCUTANEOUS
  Administered 2023-03-22 (×2): 2 [IU] via SUBCUTANEOUS
  Filled 2023-03-18 (×5): qty 1

## 2023-03-18 MED ORDER — SODIUM CHLORIDE 0.9 % IV SOLN: 250.0000 mL | INTRAVENOUS | Status: AC | PRN

## 2023-03-18 MED ORDER — MIDODRINE HCL 5 MG PO TABS: 10.0000 mg | ORAL_TABLET | Freq: Three times a day (TID) | ORAL | Status: DC

## 2023-03-18 MED ORDER — ASPIRIN 81 MG PO TBEC: 81.0000 mg | DELAYED_RELEASE_TABLET | Freq: Every day | ORAL | Status: DC

## 2023-03-18 MED ORDER — SODIUM CHLORIDE 0.9% FLUSH
3.0000 mL | Freq: Two times a day (BID) | INTRAVENOUS | Status: DC
  Administered 2023-03-18 – 2023-03-26 (×16): 3 mL via INTRAVENOUS

## 2023-03-18 MED ORDER — DEXTROSE 50 % IV SOLN
INTRAVENOUS | Status: AC
  Filled 2023-03-18: qty 50

## 2023-03-18 MED ORDER — LEVOTHYROXINE SODIUM 50 MCG PO TABS: 75.0000 ug | ORAL_TABLET | Freq: Every day | ORAL | Status: DC

## 2023-03-18 MED ORDER — ONDANSETRON HCL 4 MG/2ML IJ SOLN
4.0000 mg | Freq: Once | INTRAMUSCULAR | Status: AC
  Administered 2023-03-18: 4 mg via INTRAVENOUS
  Filled 2023-03-18: qty 2

## 2023-03-18 MED ORDER — HEPARIN SODIUM (PORCINE) 5000 UNIT/ML IJ SOLN
5000.0000 [IU] | Freq: Three times a day (TID) | INTRAMUSCULAR | Status: DC
  Administered 2023-03-18 – 2023-03-22 (×11): 5000 [IU] via SUBCUTANEOUS
  Filled 2023-03-18 (×12): qty 1

## 2023-03-18 MED ORDER — ACETAMINOPHEN 650 MG RE SUPP: 650.0000 mg | Freq: Four times a day (QID) | RECTAL | Status: DC | PRN

## 2023-03-18 MED ORDER — PANTOPRAZOLE SODIUM 40 MG PO TBEC: 40.0000 mg | DELAYED_RELEASE_TABLET | Freq: Two times a day (BID) | ORAL | Status: DC

## 2023-03-18 MED ORDER — CHLORHEXIDINE GLUCONATE CLOTH 2 % EX PADS
6.0000 | MEDICATED_PAD | Freq: Every day | CUTANEOUS | Status: DC
  Administered 2023-03-20: 6 via TOPICAL
  Filled 2023-03-18: qty 6

## 2023-03-18 MED ORDER — ATORVASTATIN CALCIUM 20 MG PO TABS
20.0000 mg | ORAL_TABLET | Freq: Every day | ORAL | Status: DC
  Administered 2023-03-19 – 2023-03-21 (×3): 20 mg via ORAL
  Filled 2023-03-18 (×3): qty 1

## 2023-03-18 MED ORDER — ACETAMINOPHEN 325 MG PO TABS
650.0000 mg | ORAL_TABLET | Freq: Four times a day (QID) | ORAL | Status: DC | PRN
  Administered 2023-03-24: 650 mg via ORAL
  Filled 2023-03-18: qty 2

## 2023-03-18 NOTE — ED Triage Notes (Signed)
 Bib ems from home with complaints of vomiting and diarrhea x 2 days. ABD pain in lower abd. Decreased appetite. Dialysis MWF has not had today.   EMS 136/79 96% RA 86 pulse   196 cbg

## 2023-03-18 NOTE — ED Notes (Signed)
 Pt states he is feeling a lot better. Warming blanket turned to low. Pt denies any needs at this time.

## 2023-03-18 NOTE — ED Notes (Signed)
 Orange juice and crackers provided. Pt encouraged to eat

## 2023-03-18 NOTE — Progress Notes (Signed)
 [name], [place] 03/18/23  Subjective:   LOS: 0  Patient known to our practice from outpatient dialysis.  He normally dialyzes at [facility] dialysis center. Last HD was on Monday.  He missed his treatment today because of weakness, vomiting, diarrhea and abdominal pain for the past 2 days or so.  He also has decreased appetite.  His CT scan of abdomen in the emergency room showed pleural effusions, ascites.  He is now admitted for further evaluation and management.  Objective:  Vital signs in last 24 hours:  Temp:  [94 F (34.4 C)-97.4 F (36.3 C)] 97.4 F (36.3 C) (03/14 1339) Pulse Rate:  [57-66] 65 (03/14 1700) Resp:  [15-18] 18 (03/14 1400) BP: (105-141)/(68-85) 114/78 (03/14 1700) SpO2:  [94 %-98 %] 94 % (03/14 1700) Weight:  [70.8 kg] 70.8 kg (03/14 0944)  Weight change:  Filed Weights   03/18/23 0944  Weight: 70.8 kg    Intake/Output:   No intake or output data in the 24 hours ending 03/18/23 1748   Physical Exam: General: Laying in the bed, no acute distress  HEENT Moist oral mucous membranes  Pulm/lungs Decreased breath sounds at bases, room air  CVS/Heart Regular rhythm  Abdomen:  Soft, mildly distended  Extremities: 2-3+ pitting edema  Neurologic: Alert, able to answer questions appropriately  Skin: No acute rashes  Access: PermCath       Basic Metabolic Panel:  Recent Labs  Lab 03/18/23 0949  NA 138  K 4.5  CL 100  CO2 24  GLUCOSE 172*  BUN 81*  CREATININE 5.04*  CALCIUM 8.8*     CBC: Recent Labs  Lab 03/18/23 0949 03/18/23 1515  WBC 3.3* 4.2  NEUTROABS  --  3.4  HGB 13.6 13.3  HCT 41.9 40.8  MCV 92.1 93.2  PLT 159 154      Lab Results  Component Value Date   HEPBSAG NON REACTIVE 10/29/2022      Microbiology:  No results found for this or any previous visit (from the past 240 hours).  Coagulation Studies: No results for input(s): "LABPROT", "INR" in the last 72  hours.  Urinalysis: No results for input(s): "COLORURINE", "LABSPEC", "PHURINE", "GLUCOSEU", "HGBUR", "BILIRUBINUR", "KETONESUR", "PROTEINUR", "UROBILINOGEN", "NITRITE", "LEUKOCYTESUR" in the last 72 hours.  Invalid input(s): "APPERANCEUR"    Imaging: CT ABDOMEN PELVIS W CONTRAST Result Date: 03/18/2023 CLINICAL DATA:  Vomiting and diarrhea for 2 days, lower abdominal pain EXAM: CT ABDOMEN AND PELVIS WITH CONTRAST TECHNIQUE: Multidetector CT imaging of the abdomen and pelvis was performed using the standard protocol following bolus administration of intravenous contrast. RADIATION DOSE REDUCTION: This exam was performed according to the departmental dose-optimization program which includes automated exposure control, adjustment of the mA and/or kV according to patient size and/or use of iterative reconstruction technique. CONTRAST:  75mL OMNIPAQUE IOHEXOL 300 MG/ML  SOLN COMPARISON:  11/10/2022 FINDINGS: Lower chest: There are large bilateral pleural effusions with dependent compressive atelectasis within the lower lobes. The heart is enlarged without pericardial effusion. Hepatobiliary: Gallbladder is unremarkable without cholelithiasis or cholecystitis. Relative hypertrophy of the left lobe liver and atrophy of the right lobe liver, which may signify underlying cirrhosis though no nodularity of the liver capsule is noted. No focal parenchymal liver abnormalities are identified. Pancreas: Stable pancreatic parenchymal atrophy most pronounced within the body and tail. No pancreatic duct dilation or acute inflammatory change. Spleen: Normal in size without focal abnormality. Adrenals/Urinary Tract: There is focal cortical scarring within the upper pole right  kidney. No other focal renal abnormalities. No urinary tract calculi or obstructive uropathy. The adrenals are unremarkable. Bladder is decompressed, limiting its evaluation. Stomach/Bowel: Continued distended fluid-filled stomach. No bowel obstruction  or ileus. Normal appendix right mid abdomen. No bowel wall thickening or inflammatory change. Vascular/Lymphatic: Extensive atherosclerosis of the abdominal aorta and its branches. No pathologic adenopathy. Reproductive: Prostate is unremarkable. Other: Large volume ascites throughout the abdomen and pelvis. There are bilateral inguinal hernias again noted, with ascites extending into the patent inguinal canals. No bowel herniation. No free intraperitoneal gas. Musculoskeletal: There are no acute or destructive bony abnormalities. Reconstructed images demonstrate no additional findings. IMPRESSION: 1. Large bilateral pleural effusions with dependent lower lobe compressive atelectasis. 2. Large volume ascites. 3. Bilateral inguinal hernias containing ascites. No bowel herniation. 4. Distended fluid-filled stomach again noted, which could reflect sequela of gastro paresis given history of diabetes. No evidence of bowel obstruction or ileus. 5.  Aortic Atherosclerosis (ICD10-I70.0). Electronically Signed   By: Sharlet Salina M.D.   On: 03/18/2023 15:26     Medications:       Assessment/ Plan:  68 y.o. male with end-stage renal disease on hemodialysis, hypertension, type 2 diabetes, congestive heart failure, coronary disease, history of stroke, history of alcohol abuse, tobacco abuse, hypothyroidism, ascites was admitted on 03/18/2023 for  Active Problems:   * No active hospital problems. *  vomiting  #Volume overload, ascites #. ESRD-[employer], MWF, 61.5 kg, 195 minutes, CVC #Hypoalbuminemia Patient missed his hemodialysis treatment today.  We will plan for dialysis tomorrow with the intent for volume removal as tolerated. We will supplement IV albumin during treatment to assist with mobilizing third spaced fluid and volume removal.  #. Anemia of CKD  Lab Results  Component Value Date   HGB 13.3 03/18/2023   Low dose EPO with HD for hemoglobin less than 10  #. Secondary  hyperparathyroidism of renal origin N 25.81   No results found for: "PTH" Lab Results  Component Value Date   PHOS 7.4 (H) 02/21/2023   Monitor calcium and phos level during this admission  #C. difficile colitis Patient tested positive for C. difficile on 03/01/2023. He states that he received his antibiotics (oral vancomycin) from the [place] yesterday evening but has not started taking them. He will need reassessment for ongoing/untreated C. difficile.  Will defer to primary team.   #. Diabetes type 2 with CKD Hemoglobin A1C (%)  Date Value  06/18/2013 11.9 (H)   Hgb A1c MFr Bld (%)  Date Value  08/29/2022 11.0 (H)  Poorly controlled diabetes   LOS: 0 Cianna Kasparian Thedore Mins 3/14/20255:48 PM  [facility] [address] [place], [place] [phone]

## 2023-03-18 NOTE — H&P (Addendum)
 [facility] History and Physical   Patient: Shawn [name] BJY:[id number]   [facility] DOB: 1955-10-29   DOA: 03/18/2023   DOS: 03/18/2023   DOS: the patient was seen and examined on 03/18/2023  Patient coming from: The patient is coming from Home  Chief Complaint: Nausea vomiting and diarrhea for 1 day  HPI: Shawn [name] is a 68 y.o. male with PMH of chronic combined systolic and diastolic congestive heart failure, HTN, CVA, IDDM T2, ESRD on HD MWF schedule, as reviewed from EMR, presented at [facility] ED with complaining of nausea vomiting and diarrhea for 1 day.  As per patient he had diarrhea yesterday but no diarrhea today.  Persistently having nausea and vomiting, could not eat today and did not take his home medications.  Patient was feeling very tired and not well so he did not go for hemodialysis, last hemodialysis was on Monday, 03/14/2023.  Patient reported dark stools and vomiting but no bright red blood noticed. Patient was feeling feeling cold, temperature was 94 in the ED, patient was given warm blankets, temperature improved.  Patient is feeling little bit improvement.  Denied any chest pain or palpitations, no headache or dizziness.  ED Course: VS temp 94 improved to 97.5 after warm blankets HR 67, RR 18, BP 141/85--- 110/74, 98% on room air BMP BG 172, BUN 81, creatinine 5.04 calcium 8.8, albumin 2.5, AST 67 CBC WBC 3.3--4.2, Hb 13.6 stable, PLT is 154 CT A/P: Large bilateral pleural effusion, lower lobe compressive atelectasis, large volume ascites.  Bilateral inguinal hernia, no obstruction or ileus. CXR: Persistent bibasilar consolidation and bilateral effusions, left greater than right.   Review of Systems: as mentioned in the history of present illness.  All other systems reviewed and are negative.  Past Medical History:  Diagnosis Date   Chronic combined systolic (congestive) and diastolic (congestive) heart failure ([name])    a. TTE 6/15:  EF < 20%, mildly dilated LV, DD, mildly dilated LA, mod dilated RA, mild MR, mild to mod TR, mod increased posterior wall thickness, elevated LA and LVEDP; b. 4/12019 Echo: EF 20-25%, diff HK. Gr1 DD, nl RV fxn.   CKD (chronic kidney disease), stage II    Diabetes mellitus with complication ([name])    a. Prior admissions w/ DKA (last 12/2017).   Essential hypertension    NICM (nonischemic cardiomyopathy) ([name])    a. 06/2013 Echo: EF<20%; b. 06/2013 Cath: no signif dzs; c. 04/2017 Echo: 20-25%, gr1 DD.; d.05/2018 Echo: 25-30%   Polysubstance abuse ([name])    a. etoh and tobacco   Stroke [facility])    Past Surgical History:  Procedure Laterality Date   CENTRAL LINE INSERTION N/A 06/29/2021   Procedure: CENTRAL LINE INSERTION;  Surgeon: Yvonne Kendall, MD;  Location: [facility] INVASIVE CV LAB;  Service: Cardiovascular;  Laterality: N/A;   IR PARACENTESIS  09/09/2022   NO PAST SURGERIES     RIGHT HEART CATH N/A 06/29/2021   Procedure: RIGHT HEART CATH;  Surgeon: Yvonne Kendall, MD;  Location: [facility] INVASIVE CV LAB;  Service: Cardiovascular;  Laterality: N/A;   Social History:  reports that he has been smoking cigarettes. He has never used smokeless tobacco. He reports that he does not currently use alcohol. He reports that he does not use drugs.  Allergies  Allergen Reactions   Empagliflozin     Other Reaction(s): Acidosis   Pravastatin     Other reaction(s): Muscle pain   Simvastatin     Other reaction(s):  Muscle pain     Family history reviewed and not pertinent Family History  Problem Relation Age of Onset   Congestive Heart Failure Mother    Diabetes Mother    Congestive Heart Failure Brother      Prior to Admission medications   Medication Sig Start Date End Date Taking? Authorizing Provider  acetaminophen (TYLENOL) 325 MG tablet Take 2 tablets (650 mg total) by mouth every 6 (six) hours as needed for mild pain (or Fever >/= 101). 01/09/21   Dorcas Carrow, MD  ammonium lactate (LAC-HYDRIN)  12 % lotion Apply 1 Application topically as needed for dry skin.    [provider]  aspirin EC 81 MG tablet Take 81 mg by mouth daily.    [provider]  atorvastatin (LIPITOR) 20 MG tablet Take 1 tablet (20 mg total) by mouth at bedtime. 02/22/23   Delfino Lovett, MD  atropine 1 % ophthalmic solution Place 1 drop into the right eye 2 (two) times daily. 10/19/22   [provider]  carvedilol (COREG) 6.25 MG tablet Take 0.5 tablets (3.125 mg total) by mouth 2 (two) times daily. Reduced from 6.25 mg. 11/16/22 11/16/23  Darlin Priestly, MD  cholecalciferol (VITAMIN D) 25 MCG (1000 UNIT) tablet Take 2,000 Units by mouth daily.    [provider]  dextrose (GLUTOSE) 40 % GEL Take 1 Tube by mouth once as needed for low blood sugar.    [provider]  insulin aspart (NOVOLOG) 100 UNIT/ML FlexPen Inject 3 Units into the skin 3 (three) times daily with meals. 11/01/22   Agbata, Tochukwu, MD  insulin glargine-yfgn (SEMGLEE) 100 UNIT/ML injection Inject 0.08 mLs (8 Units total) into the skin daily. 11/16/22   Darlin Priestly, MD  levothyroxine (SYNTHROID) 75 MCG tablet Take 75 mcg by mouth daily before breakfast.    [provider]  midodrine (PROAMATINE) 10 MG tablet TAKE ONE TABLET BY MOUTH MONDAY,WEDNESDAY,FRIDAY BEFORE DIALYSIS 02/10/23   [provider]  Multiple Vitamin (MULTIVITAMIN WITH MINERALS) TABS tablet Take 1 tablet by mouth daily. 01/10/21   Dorcas Carrow, MD  Nutritional Supplements (FEEDING SUPPLEMENT, NEPRO CARB STEADY,) LIQD Take 237 mLs by mouth 3 (three) times daily between meals. 11/16/22   Darlin Priestly, MD  omeprazole (PRILOSEC) 40 MG capsule Take 40 mg by mouth 2 (two) times daily before a meal. 01/03/23   [provider]  prednisoLONE acetate (PRED FORTE) 1 % ophthalmic suspension Place 1 drop into the right eye as directed. 10/19/22   [provider]  tamsulosin (FLOMAX) 0.4 MG CAPS capsule Take 1 capsule (0.4 mg total) by  mouth daily after supper. 09/09/22   Sunnie Nielsen, DO  torsemide (DEMADEX) 20 MG tablet Take 60 mg by mouth as directed. Take 3 tablets (60mg ) by mouth every Sunday, Tuesday, Thursday and Saturday    [provider]    Physical Exam: Vitals:   03/18/23 1630 03/18/23 1700 03/18/23 1730 03/18/23 1745  BP: 123/81 114/78 119/80   Pulse: 63 65 (!) 59 60  Resp:   18   Temp:   (!) 97.5 F (36.4 C)   TempSrc:   Oral   SpO2: 96% 94% 95% 100%  Weight:      Height:        General: NAD, sleepy, dosing off during exam  mild distress, affect depressed Eyes: PERRLA, Conjunctiva normal ENT: Oral Mucosa Clear, moist  Neck: no JVD, no Abnormal Mass Or lumps Cardiovascular: S1 and S2 Present, no Murmur, peripheral pulses symmetrical  Respiratory: good respiratory effort, Bilateral Air entry equal and Decreased, no signs of accessory muscle use, positive Crackles, no wheezes Abdomen: Bowel Sound present, Soft and no tenderness Skin: no rashes  Extremities: 3-4+ edema Left >Right, no calf tenderness Neurologic: without any new focal findings Gait not checked due to patient safety concerns  Data Reviewed: I have personally reviewed and interpreted labs, imaging as discussed below.  CBC: Recent Labs  Lab 03/18/23 0949 03/18/23 1515  WBC 3.3* 4.2  NEUTROABS  --  3.4  HGB 13.6 13.3  HCT 41.9 40.8  MCV 92.1 93.2  PLT 159 154   Basic Metabolic Panel: Recent Labs  Lab 03/18/23 0949  NA 138  K 4.5  CL 100  CO2 24  GLUCOSE 172*  BUN 81*  CREATININE 5.04*  CALCIUM 8.8*   GFR: Estimated Creatinine Clearance: 14.4 mL/min (A) (by C-G formula based on SCr of 5.04 mg/dL (H)). Liver Function Tests: Recent Labs  Lab 03/18/23 0949  AST 67*  ALT 37  ALKPHOS 118  BILITOT 0.7  PROT 6.4*  ALBUMIN 2.5*   Recent Labs  Lab 03/18/23 0949  LIPASE 21   No results for input(s): "AMMONIA" in the last 168 hours. Coagulation Profile: No results for input(s): "INR", "PROTIME" in  the last 168 hours. Cardiac Enzymes: No results for input(s): "CKTOTAL", "CKMB", "CKMBINDEX", "TROPONINI" in the last 168 hours. BNP (last 3 results) No results for input(s): "PROBNP" in the last 8760 hours. HbA1C: No results for input(s): "HGBA1C" in the last 72 hours. CBG: No results for input(s): "GLUCAP" in the last 168 hours. Lipid Profile: No results for input(s): "CHOL", "HDL", "LDLCALC", "TRIG", "CHOLHDL", "LDLDIRECT" in the last 72 hours. Thyroid Function Tests: No results for input(s): "TSH", "T4TOTAL", "FREET4", "T3FREE", "THYROIDAB" in the last 72 hours. Anemia Panel: No results for input(s): "VITAMINB12", "FOLATE", "FERRITIN", "TIBC", "IRON", "RETICCTPCT" in the last 72 hours. Urine analysis:    Component Value Date/Time   COLORURINE YELLOW (A) 09/03/2022 0543   APPEARANCEUR CLOUDY (A) 09/03/2022 0543   LABSPEC 1.019 09/03/2022 0543   PHURINE 5.0 09/03/2022 0543   GLUCOSEU 150 (A) 09/03/2022 0543   HGBUR LARGE (A) 09/03/2022 0543   BILIRUBINUR NEGATIVE 09/03/2022 0543   KETONESUR NEGATIVE 09/03/2022 0543   PROTEINUR >=300 (A) 09/03/2022 0543   NITRITE NEGATIVE 09/03/2022 0543   LEUKOCYTESUR MODERATE (A) 09/03/2022 0543    Radiological Exams on Admission: CT ABDOMEN PELVIS W CONTRAST Result Date: 03/18/2023 CLINICAL DATA:  Vomiting and diarrhea for 2 days, lower abdominal pain EXAM: CT ABDOMEN AND PELVIS WITH CONTRAST TECHNIQUE: Multidetector CT imaging of the abdomen and pelvis was performed using the standard protocol following bolus administration of intravenous contrast. RADIATION DOSE REDUCTION: This exam was performed according to the departmental dose-optimization program which includes automated exposure control, adjustment of the mA and/or kV according to patient size and/or use of iterative reconstruction technique. CONTRAST:  75mL OMNIPAQUE IOHEXOL 300 MG/ML  SOLN COMPARISON:  11/10/2022 FINDINGS: Lower chest: There are large bilateral pleural effusions with  dependent compressive atelectasis within the lower lobes. The heart is enlarged without pericardial effusion. Hepatobiliary: Gallbladder is unremarkable without cholelithiasis or cholecystitis. Relative hypertrophy of the left lobe liver and atrophy of the right lobe liver, which may signify underlying cirrhosis though no nodularity of the liver capsule is noted. No focal parenchymal liver abnormalities are identified. Pancreas: Stable pancreatic parenchymal atrophy most pronounced within the body and tail. No pancreatic duct dilation or acute inflammatory change. Spleen: Normal in size without focal abnormality.  Adrenals/Urinary Tract: There is focal cortical scarring within the upper pole right kidney. No other focal renal abnormalities. No urinary tract calculi or obstructive uropathy. The adrenals are unremarkable. Bladder is decompressed, limiting its evaluation. Stomach/Bowel: Continued distended fluid-filled stomach. No bowel obstruction or ileus. Normal appendix right mid abdomen. No bowel wall thickening or inflammatory change. Vascular/Lymphatic: Extensive atherosclerosis of the abdominal aorta and its branches. No pathologic adenopathy. Reproductive: Prostate is unremarkable. Other: Large volume ascites throughout the abdomen and pelvis. There are bilateral inguinal hernias again noted, with ascites extending into the patent inguinal canals. No bowel herniation. No free intraperitoneal gas. Musculoskeletal: There are no acute or destructive bony abnormalities. Reconstructed images demonstrate no additional findings. IMPRESSION: 1. Large bilateral pleural effusions with dependent lower lobe compressive atelectasis. 2. Large volume ascites. 3. Bilateral inguinal hernias containing ascites. No bowel herniation. 4. Distended fluid-filled stomach again noted, which could reflect sequela of gastro paresis given history of diabetes. No evidence of bowel obstruction or ileus. 5.  Aortic Atherosclerosis  (ICD10-I70.0). Electronically Signed   By: Sharlet Salina M.D.   On: 03/18/2023 15:26   EKG: pending   Echocardiogram: Pending  I reviewed all nursing notes, pharmacy notes, vitals, pertinent old records.  Assessment/Plan Principal Problem:   Volume overload   # Acute gastroenteritis Diarrhea and vomiting resolved, still having some nausea Presented with weakness and decreased p.o. intake Continue oral hydration and soft diet Monitor electrolytes and replete Continue symptomatic treatment. Started pantoprazole 40 mg p.o. twice daily, prior history of GI bleed possible Mallory-Weiss tear.  Currently H&H is stable. Follow urine drug screen Patient was feeling weak and tired, check iron, folate, B12, vitamin D level  # Volume overload due to CHF and ESRD Presented with bilateral pleural effusion, large volume ascites and pericardial effusion Started IV Lasix 40 mg twice daily Volume management with hemodialysis as per nephrology   # Bilateral pleural effusion due to CHF and ESRD Continue IV Lasix Thoracentesis by IR will be done as soon IR will be available.  May not get thoracentesis over the weekend Follow CT chest  # Large volume ascites, abdominal soft, no need of paracentesis at this time Continue IV Lasix Fluid management by hemodialysis  # Combined chronic systolic and diastolic CHF exacerbation due to ESRD Continue IV Lasix Hold Coreg for now due to low blood pressure Fluid management by hemodialysis Continue fluid restriction 1.5 L/day Follow TTE  # Hypotension Started midodrine 10 mg p.o. 3 times daily with holding parameters Monitor BP and titrate medication accordingly   # ESRD on HD MWF schedule Presented with volume overload, missed hemodialysis feeling sick, last hemodialysis was on 03/14/2023. Nephrology consulted, most likely patient will get hemodialysis done tomorrow a.m. on 03/19/2023 Follow nephrology for further management  # HTN, HLD Blood  pressure soft, held Coreg for now Continue statin Held aspirin for now  Goals of care discussion: Overall prognosis is poor, patient remained full code. Palliative care consulted for goals of care discussion as long-term prognosis is very poor   Nutrition: Carb modified diet, fluid restriction 1.5 L/day DVT Prophylaxis: Subcutaneous Heparin   Advance goals of care discussion: Full code   Consults: I personally Discussed with nephrology  Family Communication: family was not present at bedside, at the time of interview.  Opportunity was given to ask question and all questions were answered satisfactorily.  Disposition: Admitted as inpatient, medical telemetry unit. Likely to be discharged home, in 3-4 days when stable.  I have discussed plan of  care as described above with RN and patient/family.  Severity of Illness: The appropriate patient status for this patient is INPATIENT. Inpatient status is judged to be reasonable and necessary in order to provide the required intensity of service to ensure the patient's safety. The patient's presenting symptoms, physical exam findings, and initial radiographic and laboratory data in the context of their chronic comorbidities is felt to place them at high risk for further clinical deterioration. Furthermore, it is not anticipated that the patient will be medically stable for discharge from the hospital within 2 midnights of admission.   * I certify that at the point of admission it is my clinical judgment that the patient will require inpatient hospital care spanning beyond 2 midnights from the point of admission due to high intensity of service, high risk for further deterioration and high frequency of surveillance required.*   Total time spent: 75 minutes  Author: Gillis Santa, MD [facility] 03/18/2023 6:05 PM   To reach On-call, see care teams to locate the attending and reach out to them via [email or web address]. If 7PM-7AM, please contact  night-coverage If you still have difficulty reaching the attending provider, please page the [facility] (Director on Call) for [facility] on amion for assistance.

## 2023-03-18 NOTE — ED Provider Notes (Signed)
 [facility] Provider Note    Event Date/Time   First MD Initiated Contact with Patient 03/18/23 [phone]     (approximate)   History   Abdominal Pain and Emesis   HPI  Shawn [name] is a 68 y.o. male with history of ESRD on dialysis, hypertension, diabetes, CHF, CAD, CVA, alcohol abuse, and hypothyroidism who presents with vomiting, diarrhea, and abdominal pain since yesterday.  The patient states that the pain is in his lower abdomen.  He reports multiple episodes of vomiting and diarrhea.  He is concerned there may be blood because the vomitus and diarrhea are "dark" although he has difficulty providing more specific history.  He denies any obvious bright red blood.  I reviewed the past medical records.  The patient was admitted to the hospitalist service last month due to hematemesis that resolved with stable hemoglobins.   Physical Exam   Triage Vital Signs: ED Triage Vitals  Encounter Vitals Group     BP 03/18/23 0945 (!) 141/85     Systolic BP Percentile --      Diastolic BP Percentile --      Pulse Rate 03/18/23 0945 66     Resp 03/18/23 0945 18     Temp 03/18/23 0950 (!) 94 F (34.4 C)     Temp Source 03/18/23 0950 Rectal     SpO2 03/18/23 0945 96 %     Weight 03/18/23 0944 156 lb (70.8 kg)     Height 03/18/23 0944 5\' 11"  (1.803 m)     Head Circumference --      Peak Flow --      Pain Score 03/18/23 0943 8     Pain Loc --      Pain Education --      Exclude from Growth Chart --     Most recent vital signs: Vitals:   03/18/23 1430 03/18/23 1500  BP: 120/74 110/71  Pulse: 62 (!) 59  Resp:    Temp:    SpO2: 97% 97%     General: Alert, no distress.  CV:  Good peripheral perfusion.  Resp:  Normal effort.  Abd:  Soft with mild bilateral lower quadrant tenderness.  No distention.  Other:  No jaundice or scleral icterus.   ED Results / Procedures / Treatments   Labs (all labs ordered are listed, but only abnormal results are  displayed) Labs Reviewed  COMPREHENSIVE METABOLIC PANEL - Abnormal; Notable for the following components:      Result Value   Glucose, Bld 172 (*)    BUN 81 (*)    Creatinine, Ser 5.04 (*)    Calcium 8.8 (*)    Total Protein 6.4 (*)    Albumin 2.5 (*)    AST 67 (*)    GFR, Estimated 12 (*)    All other components within normal limits  CBC - Abnormal; Notable for the following components:   WBC 3.3 (*)    RDW 15.7 (*)    All other components within normal limits  LIPASE, BLOOD  URINALYSIS, ROUTINE W REFLEX MICROSCOPIC  CBC WITH DIFFERENTIAL/PLATELET     EKG  ED ECG REPORT I, Dionne Bucy, the attending physician, personally viewed and interpreted this ECG.  Date: 03/18/2023 EKG Time: 0946 Rate: 64 Rhythm: normal sinus rhythm QRS Axis: normal Intervals: Nonspecific IVCD ST/T Wave abnormalities: LVH with repolarization abnormality Narrative Interpretation: no evidence of acute ischemia; no significant change when compared to EKG of 02/19/2023    RADIOLOGY  CT abdomen/pelvis: Pending   PROCEDURES:  Critical Care performed: No  Procedures   MEDICATIONS ORDERED IN ED: Medications  iohexol (OMNIPAQUE) 300 MG/ML solution 75 mL (75 mLs Intravenous Contrast Given 03/18/23 1300)  ondansetron (ZOFRAN) injection 4 mg (4 mg Intravenous Given 03/18/23 1514)     IMPRESSION / MDM / ASSESSMENT AND PLAN / ED COURSE  I reviewed the triage vital signs and the nursing notes.  68 year old male with PMH as noted above presents with lower abdominal pain associated with nausea, vomiting, diarrhea since yesterday.  On exam the vital signs are normal except that the patient is somewhat hypothermic.  We have placed him on a Lawyer.  Abdomen is soft with mild bilateral lower quadrant tenderness.  He has not had any active vomiting in the ED.  Differential diagnosis includes, but is not limited to, colitis, diverticulitis, gastroenteritis, gastritis, gastroparesis, PUD, SBO,  volvulus.  We will obtain lab workup, CT, and reassess.  Patient's presentation is most consistent with acute presentation with potential threat to life or bodily function.  The patient is on the cardiac monitor to evaluate for evidence of arrhythmia and/or significant heart rate changes.  ----------------------------------------- 3:20 PM on 03/18/2023 -----------------------------------------  Lab work is reassuring.  Hemoglobin is normal.  CMP shows no concerning acute findings.  Potassium is normal.  CT was obtained and the read is pending.  The patient has not had any further vomiting although he still reports nausea.  I have ordered a dose of IV Zofran.  Will obtain a repeat CBC to make sure there is no drop in hemoglobin.  I consulted and discussed the case with Dr. Thedore Mins from nephrology who advises that the patient is scheduled for outpatient dialysis tomorrow at 11 AM if he ends up being discharged.  Dialysis can be done inpatient if he ends up needing to be admitted.  I have handed off the patient to the oncoming ED physician Dr. Vicente Males pending CT, repeat CBC, and p.o. challenge.  FINAL CLINICAL IMPRESSION(S) / ED DIAGNOSES   Final diagnoses:  Nausea vomiting and diarrhea  Lower abdominal pain     Rx / DC Orders   ED Discharge Orders     None        Note:  This document was prepared using Dragon voice recognition software and may include unintentional dictation errors.    Dionne Bucy, MD 03/18/23 1521

## 2023-03-19 ENCOUNTER — Inpatient Hospital Stay

## 2023-03-19 ENCOUNTER — Inpatient Hospital Stay (HOSPITAL_COMMUNITY)
Admit: 2023-03-19 | Discharge: 2023-03-19 | Disposition: A | Discharge status: Home / Self Care | Attending: Student | Admitting: Student

## 2023-03-19 DIAGNOSIS — I5021 Acute systolic (congestive) heart failure: Secondary | ICD-10-CM | POA: Diagnosis not present

## 2023-03-19 DIAGNOSIS — E8770 Fluid overload, unspecified: Secondary | ICD-10-CM | POA: Diagnosis not present

## 2023-03-19 LAB — ECHOCARDIOGRAM COMPLETE
AR max vel: 1.69 cm2
AV Area VTI: 1.76 cm2
AV Area mean vel: 1.36 cm2
AV Mean grad: 2 mmHg
AV Peak grad: 3.2 mmHg
Ao pk vel: 0.9 m/s
Area-P 1/2: 4.99 cm2
Calc EF: 23.6 %
Height: 71 in
MV VTI: 1.23 cm2
S' Lateral: 4.8 cm
Single Plane A2C EF: 19.6 %
Single Plane A4C EF: 18.6 %
Weight: 2496 [oz_av]

## 2023-03-19 LAB — CBG MONITORING, ED
Glucose-Capillary: 132 mg/dL — ABNORMAL HIGH (ref 70–99)
Glucose-Capillary: 147 mg/dL — ABNORMAL HIGH (ref 70–99)
Glucose-Capillary: 216 mg/dL — ABNORMAL HIGH (ref 70–99)
Glucose-Capillary: 54 mg/dL — ABNORMAL LOW (ref 70–99)
Glucose-Capillary: 66 mg/dL — ABNORMAL LOW (ref 70–99)
Glucose-Capillary: 71 mg/dL (ref 70–99)
Glucose-Capillary: 79 mg/dL (ref 70–99)

## 2023-03-19 LAB — PHOSPHORUS: Phosphorus: 5.5 mg/dL — ABNORMAL HIGH (ref 2.5–4.6)

## 2023-03-19 LAB — CBC
HCT: 43.5 % (ref 39.0–52.0)
Hemoglobin: 14.1 g/dL (ref 13.0–17.0)
MCH: 30.3 pg (ref 26.0–34.0)
MCHC: 32.4 g/dL (ref 30.0–36.0)
MCV: 93.3 fL (ref 80.0–100.0)
Platelets: 156 10*3/uL (ref 150–400)
RBC: 4.66 MIL/uL (ref 4.22–5.81)
RDW: 15.7 % — ABNORMAL HIGH (ref 11.5–15.5)
WBC: 5.7 10*3/uL (ref 4.0–10.5)
nRBC: 0 % (ref 0.0–0.2)

## 2023-03-19 LAB — BASIC METABOLIC PANEL
Anion gap: 10 (ref 5–15)
BUN: 83 mg/dL — ABNORMAL HIGH (ref 8–23)
CO2: 22 mmol/L (ref 22–32)
Calcium: 8.1 mg/dL — ABNORMAL LOW (ref 8.9–10.3)
Chloride: 100 mmol/L (ref 98–111)
Creatinine, Ser: 5.29 mg/dL — ABNORMAL HIGH (ref 0.61–1.24)
GFR, Estimated: 11 mL/min — ABNORMAL LOW (ref >60)
Glucose, Bld: 166 mg/dL — ABNORMAL HIGH (ref 70–99)
Potassium: 5.2 mmol/L — ABNORMAL HIGH (ref 3.5–5.1)
Sodium: 132 mmol/L — ABNORMAL LOW (ref 135–145)

## 2023-03-19 LAB — CORTISOL-AM, BLOOD: Cortisol - AM: 23.9 ug/dL — ABNORMAL HIGH (ref 6.7–22.6)

## 2023-03-19 LAB — MAGNESIUM: Magnesium: 1.9 mg/dL (ref 1.7–2.4)

## 2023-03-19 LAB — BODY FLUID CELL COUNT WITH DIFFERENTIAL
Eos, Fluid: 0 %
Lymphs, Fluid: 77 %
Monocyte-Macrophage-Serous Fluid: 3 %
Neutrophil Count, Fluid: 20 %
Total Nucleated Cell Count, Fluid: 287 uL

## 2023-03-19 LAB — PROTEIN, PLEURAL OR PERITONEAL FLUID: Total protein, fluid: <3 g/dL

## 2023-03-19 LAB — LACTATE DEHYDROGENASE, PLEURAL OR PERITONEAL FLUID: LD, Fluid: 49 U/L — ABNORMAL HIGH (ref 3–23)

## 2023-03-19 LAB — VITAMIN B12: Vitamin B-12: 1015 pg/mL — ABNORMAL HIGH (ref 180–914)

## 2023-03-19 LAB — HEPATITIS B SURFACE ANTIGEN: Hepatitis B Surface Ag: NONREACTIVE

## 2023-03-19 LAB — VITAMIN D 25 HYDROXY (VIT D DEFICIENCY, FRACTURES): Vit D, 25-Hydroxy: 55.33 ng/mL (ref 30–100)

## 2023-03-19 MED ORDER — LIDOCAINE HCL (PF) 1 % IJ SOLN: 5.0000 mL | INTRAMUSCULAR | Status: DC | PRN

## 2023-03-19 MED ORDER — DEXTROSE-SODIUM CHLORIDE 5-0.9 % IV SOLN: INTRAVENOUS | Status: AC

## 2023-03-19 MED ORDER — DEXTROSE 50 % IV SOLN
25.0000 g | Freq: Once | INTRAVENOUS | Status: AC
  Administered 2023-03-19: 25 g via INTRAVENOUS
  Filled 2023-03-19: qty 50

## 2023-03-19 MED ORDER — CALCIUM ACETATE (PHOS BINDER) 667 MG PO CAPS
667.0000 mg | ORAL_CAPSULE | Freq: Three times a day (TID) | ORAL | Status: DC
  Administered 2023-03-20 – 2023-03-22 (×8): 667 mg via ORAL
  Filled 2023-03-19 (×8): qty 1

## 2023-03-19 MED ORDER — FUROSEMIDE 10 MG/ML IJ SOLN: 40.0000 mg | Freq: Two times a day (BID) | INTRAMUSCULAR | Status: DC

## 2023-03-19 MED ORDER — LIDOCAINE HCL (PF) 1 % IJ SOLN
10.0000 mL | Freq: Once | INTRAMUSCULAR | Status: AC
  Administered 2023-03-19: 10 mL via INTRADERMAL

## 2023-03-19 NOTE — ED Notes (Signed)
 Pt has been refusing to eat. After further education and encouragement pt set up to eat graham crackers with peanut butter and orange juice

## 2023-03-19 NOTE — Procedures (Addendum)
 PROCEDURE SUMMARY:  Successful image-guided left-sided diagnostic and therapeutic thoracentesis. Yielded 550 cc of clear, straw-colored plural fluid. Patient tolerated procedure well. EBL: Zero No immediate complications.  Of note, right thorax without fluid quantity sufficient for thoracentesis on ultrasound.  Specimen was sent for labs. Post procedure CXR without evidence of pneumothorax.  Please see imaging section of Epic for full dictation.  [facility]ing Neighbors Margaret Staggs PA-C 03/19/2023 2:35 PM

## 2023-03-19 NOTE — Progress Notes (Signed)
 [facility]  ROUNDING NOTE   Subjective:     Objective:  Vital signs in last 24 hours:  Temp:  [93.5 F (34.2 C)-98.1 F (36.7 C)] 98.1 F (36.7 C) (03/15 1541) Pulse Rate:  [54-65] 60 (03/15 1600) Resp:  [10-15] 12 (03/15 1600) BP: (107-128)/(65-85) 123/70 (03/15 1600) SpO2:  [95 %-100 %] 97 % (03/15 1600)  Weight change:  Filed Weights   03/18/23 0944  Weight: 70.8 kg    Intake/Output: No intake/output data recorded.   Intake/Output this shift:  No intake/output data recorded.  Physical Exam: General: NAD,   Head: Normocephalic, atraumatic. Moist oral mucosal membranes  Eyes: Anicteric, PERRL  Neck: Supple, trachea midline  Lungs:  Clear to auscultation  Heart: Regular rate and rhythm  Abdomen:  Soft, nontender,   Extremities:  +2 BLE peripheral edema.  Neurologic: Nonfocal, moving all four extremities  Skin: No lesions  Access: Rt chest Permcath    Basic Metabolic Panel: Recent Labs  Lab 03/18/23 0949 03/19/23 0821  NA 138 132*  K 4.5 5.2*  CL 100 100  CO2 24 22  GLUCOSE 172* 166*  BUN 81* 83*  CREATININE 5.04* 5.29*  CALCIUM 8.8* 8.1*  MG 1.9 1.9  PHOS 5.1* 5.5*    Liver Function Tests: Recent Labs  Lab 03/18/23 0949  AST 67*  ALT 37  ALKPHOS 118  BILITOT 0.7  PROT 6.4*  ALBUMIN 2.5*   Recent Labs  Lab 03/18/23 0949  LIPASE 21   No results for input(s): "AMMONIA" in the last 168 hours.  CBC: Recent Labs  Lab 03/18/23 0949 03/18/23 1515 03/19/23 0821  WBC 3.3* 4.2 5.7  NEUTROABS  --  3.4  --   HGB 13.6 13.3 14.1  HCT 41.9 40.8 43.5  MCV 92.1 93.2 93.3  PLT 159 154 156    Cardiac Enzymes: No results for input(s): "CKTOTAL", "CKMB", "CKMBINDEX", "TROPONINI" in the last 168 hours.  BNP: Invalid input(s): "POCBNP"  CBG: Recent Labs  Lab 03/19/23 0414 03/19/23 0731 03/19/23 0902 03/19/23 1135 03/19/23 1622  GLUCAP 71 54* 132* 147* 216*    Microbiology: Results for orders placed or performed  during the hospital encounter of 03/01/23  C Difficile Quick Screen w PCR reflex     Status: Abnormal   Collection Time: 03/01/23 10:00 AM   Specimen: STOOL  Result Value Ref Range Status   C Diff antigen POSITIVE (A) NEGATIVE Final   C Diff toxin POSITIVE (A) NEGATIVE Final   C Diff interpretation Toxin producing C. difficile detected.  Final    Comment: CRITICAL RESULT CALLED TO, READ BACK BY AND VERIFIED WITH: WHITNEY COBBINS 03/01/23 1315 MW Performed at [facility], [address] [place], [place] [address]   Stool culture     Status: None   Collection Time: 03/01/23 10:00 AM   Specimen: Perirectal; Stool  Result Value Ref Range Status   Salmonella/Shigella Screen Final report  Final   Campylobacter Culture Final report  Final   E coli, Shiga toxin Assay Negative Negative Final    Comment: (NOTE) Performed At: [facility] [address] [place], [place] [id number] Jolene Schimke MD MV:[id number]   STOOL CULTURE REFLEX - RSASHR     Status: None   Collection Time: 03/01/23 10:00 AM  Result Value Ref Range Status   Stool Culture result 1 (RSASHR) Comment  Final    Comment: (NOTE) No Salmonella or Shigella recovered. Performed At: [facility] [address] [place], [place] [id number] Clovis Riley  Claudie Fisherman MD MV:[id number]   STOOL CULTURE Reflex - CMPCXR     Status: None   Collection Time: 03/01/23 10:00 AM  Result Value Ref Range Status   Stool Culture result 1 (CMPCXR) Comment  Final    Comment: (NOTE) No Campylobacter species isolated. Performed At: [facility] [address] [place], [place] [id number] Jolene Schimke MD UU:[id number]     Coagulation Studies: No results for input(s): "LABPROT", "INR" in the last 72 hours.  Urinalysis: No results for input(s): "COLORURINE", "LABSPEC", "PHURINE", "GLUCOSEU", "HGBUR", "BILIRUBINUR", "KETONESUR", "PROTEINUR", "UROBILINOGEN", "NITRITE", "LEUKOCYTESUR" in the last 72 hours.  Invalid  input(s): "APPERANCEUR"    Imaging: DG Chest Port 1 View Result Date: 03/19/2023 CLINICAL DATA:  142230 Pleural effusion 142230 [id number] S/P thoracentesis [id number]. EXAM: PORTABLE CHEST 1 VIEW COMPARISON:  03/18/2023. FINDINGS: There is significant interval decrease in the left pleural effusion, status post thoracentesis. No pneumothorax. There are residual atelectatic changes at the left lung base. Bilateral lung fields are otherwise clear. Right lateral costophrenic angle is clear. Stable cardio-mediastinal silhouette. No acute osseous abnormalities. The soft tissues are within normal limits. Right IJ hemodialysis catheter noted with its tip overlying the cavoatrial junction region. IMPRESSION: Significant interval decrease in the left pleural effusion, status post thoracentesis. No pneumothorax. Electronically Signed   By: Jules Schick M.D.   On: 03/19/2023 14:59   US THORACENTESIS ASP PLEURAL SPACE W/IMG GUIDE Result Date: 03/19/2023 INDICATION: 27 you male with bilateral pleural effusions on recent imaging. IR was requested for diagnostic and therapeutic thoracentesis. EXAM: ULTRASOUND GUIDED DIAGNOSTIC AND THERAPEUTIC LEFT-SIDED THORACENTESIS MEDICATIONS: 6 cc OF 1% lidocaine COMPLICATIONS: None immediate. PROCEDURE: An ultrasound guided thoracentesis was thoroughly discussed with the patient and questions answered. The benefits, risks, alternatives and complications were also discussed. The patient understands and wishes to proceed with the procedure. Written consent was obtained. Ultrasound was performed to localize and mark an adequate pocket of fluid in the left chest. The area was then prepped and draped in the normal sterile fashion. 1% Lidocaine was used for local anesthesia. Under ultrasound guidance a 6 Fr Safe-T-Centesis catheter was introduced. Thoracentesis was performed. The catheter was removed and a dressing applied. FINDINGS: A total of approximately 550 cc of clear, straw-colored pleural  fluid was removed. Samples were sent to the laboratory as requested by the clinical team. Right thorax not amenable to thoracentesis at this time due to low volume on ultrasound. IMPRESSION: Successful ultrasound guided left thoracentesis yielding 500 cc of pleural fluid. Preformed by Buzzy Han, PA-C. Electronically Signed   By: Irish Lack M.D.   On: 03/19/2023 14:44   ECHOCARDIOGRAM COMPLETE Result Date: 03/19/2023    ECHOCARDIOGRAM REPORT   Patient Name:   Shawn [name] Date of Exam: 03/19/2023 Medical Rec #:  [id number]           Height:       71.0 in Accession #:    [id number]          Weight:       156.0 lb Date of Birth:  [birth year]            BSA:          1.897 m Patient Age:    67 years            BP:           121/82 mmHg Patient Gender: M                   HR:  57 bpm. Exam Location:  [facility] Procedure: 2D Echo, Cardiac Doppler, Color Doppler and Strain Analysis (Both            Spectral and Color Flow Doppler were utilized during procedure). Indications:     CHF-Acute Systolic I50.21  History:         Patient has prior history of Echocardiogram examinations.                  Stroke; Risk Factors:Hypertension.  Sonographer:     Neysa Bonito Roar Referring Phys:  [id number] Gillis Santa Diagnosing Phys: Chilton Si MD  Sonographer Comments: Global longitudinal strain was attempted. IMPRESSIONS  1. Left ventricular ejection fraction, by estimation, is 20 to 25%. The left ventricle has severely decreased function. The left ventricle demonstrates regional wall motion abnormalities (see scoring diagram/findings for description). There is mild concentric left ventricular hypertrophy. The average left ventricular global longitudinal strain is -5.7 %. The global longitudinal strain is abnormal.  2. Right ventricular systolic function is normal. The right ventricular size is normal. There is normal pulmonary artery systolic pressure.  3. Left atrial size was severely dilated.  4. A small  pericardial effusion is present. The pericardial effusion is localized near the right atrium. Large pleural effusion in the left lateral region.  5. The mitral valve is normal in structure. Mild mitral valve regurgitation. No evidence of mitral stenosis.  6. The aortic valve is tricuspid. Aortic valve regurgitation is not visualized. No aortic stenosis is present.  7. The inferior vena cava is normal in size with greater than 50% respiratory variability, suggesting right atrial pressure of 3 mmHg. FINDINGS  Left Ventricle: Left ventricular ejection fraction, by estimation, is 20 to 25%. The left ventricle has severely decreased function. The left ventricle demonstrates regional wall motion abnormalities. The average left ventricular global longitudinal strain is -5.7 %. Strain was performed and the global longitudinal strain is abnormal. The left ventricular internal cavity size was normal in size. There is mild concentric left ventricular hypertrophy.  LV Wall Scoring: The entire inferior wall, mid inferoseptal segment, and basal inferoseptal segment are akinetic. The entire anterior wall, entire lateral wall, entire anterior septum, and apex are hypokinetic. Right Ventricle: The right ventricular size is normal. No increase in right ventricular wall thickness. Right ventricular systolic function is normal. There is normal pulmonary artery systolic pressure. The tricuspid regurgitant velocity is 2.23 m/s, and  with an assumed right atrial pressure of 3 mmHg, the estimated right ventricular systolic pressure is 22.9 mmHg. Left Atrium: Left atrial size was severely dilated. Right Atrium: Right atrial size was normal in size. Pericardium: A small pericardial effusion is present. The pericardial effusion is localized near the right atrium. Mitral Valve: The mitral valve is normal in structure. Mild mitral valve regurgitation. No evidence of mitral valve stenosis. MV peak gradient, 1.8 mmHg. The mean mitral valve  gradient is 1.0 mmHg. Tricuspid Valve: The tricuspid valve is normal in structure. Tricuspid valve regurgitation is trivial. No evidence of tricuspid stenosis. Aortic Valve: The aortic valve is tricuspid. Aortic valve regurgitation is not visualized. No aortic stenosis is present. Aortic valve mean gradient measures 2.0 mmHg. Aortic valve peak gradient measures 3.2 mmHg. Aortic valve area, by VTI measures 1.76 cm. Pulmonic Valve: The pulmonic valve was normal in structure. Pulmonic valve regurgitation is not visualized. No evidence of pulmonic stenosis. Aorta: The aortic root is normal in size and structure. Venous: The inferior vena cava is normal in size with greater than 50% respiratory  variability, suggesting right atrial pressure of 3 mmHg. IAS/Shunts: No atrial level shunt detected by color flow Doppler. Additional Comments: There is a large pleural effusion in the left lateral region.  LEFT VENTRICLE PLAX 2D LVIDd:         5.30 cm      Diastology LVIDs:         4.80 cm      LV e' medial:    4.57 cm/s LV PW:         1.20 cm      LV E/e' medial:  16.8 LV IVS:        1.10 cm      LV e' lateral:   3.68 cm/s LVOT diam:     1.70 cm      LV E/e' lateral: 20.9 LV SV:         27 LV SV Index:   14           2D Longitudinal Strain LVOT Area:     2.27 cm     2D Strain GLS (A4C):   -5.0 %                             2D Strain GLS (A3C):   -4.6 %                             2D Strain GLS (A2C):   -7.4 % LV Volumes (MOD)            2D Strain GLS Avg:     -5.7 % LV vol d, MOD A2C: 240.0 ml LV vol d, MOD A4C: 161.0 ml LV vol s, MOD A2C: 193.0 ml LV vol s, MOD A4C: 131.0 ml LV SV MOD A2C:     47.0 ml LV SV MOD A4C:     161.0 ml LV SV MOD BP:      51.0 ml RIGHT VENTRICLE RV Basal diam:  4.00 cm RV Mid diam:    3.60 cm RV S prime:     8.78 cm/s TAPSE (M-mode): 1.8 cm LEFT ATRIUM              Index        RIGHT ATRIUM           Index LA diam:        4.50 cm  2.37 cm/m   RA Area:     17.00 cm LA Vol (A2C):   105.0 ml 55.34  ml/m  RA Volume:   48.30 ml  25.46 ml/m LA Vol (A4C):   81.2 ml  42.80 ml/m LA Biplane Vol: 91.1 ml  48.02 ml/m  AORTIC VALVE                    PULMONIC VALVE AV Area (Vmax):    1.69 cm     PV Vmax:          0.85 m/s AV Area (Vmean):   1.36 cm     PV Peak grad:     2.9 mmHg AV Area (VTI):     1.76 cm     PR End Diast Vel: 2.27 msec AV Vmax:           89.90 cm/s   RVOT Peak grad:   1 mmHg AV Vmean:          60.900 cm/s AV VTI:  0.156 m AV Peak Grad:      3.2 mmHg AV Mean Grad:      2.0 mmHg LVOT Vmax:         67.10 cm/s LVOT Vmean:        36.500 cm/s LVOT VTI:          0.121 m LVOT/AV VTI ratio: 0.78  AORTA Ao Root diam: 2.80 cm Ao Asc diam:  3.50 cm MITRAL VALVE               TRICUSPID VALVE MV Area (PHT): 4.99 cm    TR Peak grad:   19.9 mmHg MV Area VTI:   1.23 cm    TR Vmax:        223.00 cm/s MV Peak grad:  1.8 mmHg MV Mean grad:  1.0 mmHg    SHUNTS MV Vmax:       0.68 m/s    Systemic VTI:  0.12 m MV Vmean:      48.3 cm/s   Systemic Diam: 1.70 cm MV Decel Time: 152 msec MV E velocity: 77.00 cm/s MV A velocity: 47.20 cm/s MV E/A ratio:  1.63 MV A Prime:    3.4 cm/s Chilton Si MD Electronically signed by Chilton Si MD Signature Date/Time: 03/19/2023/2:19:15 PM    Final    CT CHEST WO CONTRAST Result Date: 03/18/2023 CLINICAL DATA:  Chronic dyspnea vomiting diarrhea EXAM: CT CHEST WITHOUT CONTRAST TECHNIQUE: Multidetector CT imaging of the chest was performed following the standard protocol without IV contrast. RADIATION DOSE REDUCTION: This exam was performed according to the departmental dose-optimization program which includes automated exposure control, adjustment of the mA and/or kV according to patient size and/or use of iterative reconstruction technique. COMPARISON:  Chest x-ray 03/18/2023 FINDINGS: Cardiovascular: Limited evaluation without intravenous contrast. Moderate aortic atherosclerosis. No aneurysm. Coronary vascular calcification. Cardiomegaly. Small pericardial  effusion. Diffuse fluid in the mediastinum. Right-sided vascular catheter tip in the right atrium. Mediastinum/Nodes: Patent trachea. No thyroid mass. Fluid-filled esophagus. No suspicious lymph nodes Lungs/Pleura: Moderate to large bilateral pleural effusions. Emphysema. Probable scarring at the right apex. Partial lower lobe consolidations. Upper Abdomen: Large volume ascites Musculoskeletal: No acute or suspicious osseous abnormality. Extensive subcutaneous and soft tissue edema IMPRESSION: 1. Moderate to large bilateral pleural effusions with partial lower lobe consolidations, probable passive atelectasis. 2. Cardiomegaly with small pericardial effusion and mild diffuse fluid in the mediastinum. 3. Large volume ascites. Extensive subcutaneous and soft tissue edema. 4. Aortic atherosclerosis. Aortic Atherosclerosis (ICD10-I70.0). Electronically Signed   By: Jasmine Pang M.D.   On: 03/18/2023 20:41   US Venous Img Lower Bilateral (DVT) Result Date: 03/18/2023 CLINICAL DATA:  Lower extremity edema EXAM: Bilateral LOWER EXTREMITY VENOUS DOPPLER ULTRASOUND TECHNIQUE: Gray-scale sonography with compression, as well as color and duplex ultrasound, were performed to evaluate the deep venous system(s) from the level of the common femoral vein through the popliteal and proximal calf veins. COMPARISON:  02/22/2023 FINDINGS: VENOUS Normal compressibility of the common femoral, superficial femoral, and popliteal veins, as well as the visualized calf veins. Visualized portions of profunda femoral vein and great saphenous vein unremarkable. No filling defects to suggest DVT on grayscale or color Doppler imaging. Doppler waveforms show normal direction of venous flow, normal respiratory plasticity and response to augmentation. OTHER Peripheral vascular disease with extensive atherosclerosis of visualized lower extremity arteries. Limitations: none IMPRESSION: Negative for acute lower extremity DVT. Peripheral vascular  disease. Electronically Signed   By: Jasmine Pang M.D.   On: 03/18/2023 20:31  DG Chest Port 1 View Result Date: 03/18/2023 CLINICAL DATA:  Pleural effusion EXAM: PORTABLE CHEST 1 VIEW COMPARISON:  02/19/2023 FINDINGS: Single frontal view of the chest demonstrates stable right internal jugular dialysis catheter. Cardiac silhouette is enlarged but stable. There are persistent bilateral pleural effusions and bibasilar consolidation, left greater than right. No pneumothorax. No acute bony abnormalities. IMPRESSION: 1. Persistent bibasilar consolidation and bilateral effusions, left greater than right. Electronically Signed   By: Sharlet Salina M.D.   On: 03/18/2023 19:30   CT ABDOMEN PELVIS W CONTRAST Result Date: 03/18/2023 CLINICAL DATA:  Vomiting and diarrhea for 2 days, lower abdominal pain EXAM: CT ABDOMEN AND PELVIS WITH CONTRAST TECHNIQUE: Multidetector CT imaging of the abdomen and pelvis was performed using the standard protocol following bolus administration of intravenous contrast. RADIATION DOSE REDUCTION: This exam was performed according to the departmental dose-optimization program which includes automated exposure control, adjustment of the mA and/or kV according to patient size and/or use of iterative reconstruction technique. CONTRAST:  75mL OMNIPAQUE IOHEXOL 300 MG/ML  SOLN COMPARISON:  11/10/2022 FINDINGS: Lower chest: There are large bilateral pleural effusions with dependent compressive atelectasis within the lower lobes. The heart is enlarged without pericardial effusion. Hepatobiliary: Gallbladder is unremarkable without cholelithiasis or cholecystitis. Relative hypertrophy of the left lobe liver and atrophy of the right lobe liver, which may signify underlying cirrhosis though no nodularity of the liver capsule is noted. No focal parenchymal liver abnormalities are identified. Pancreas: Stable pancreatic parenchymal atrophy most pronounced within the body and tail. No pancreatic duct  dilation or acute inflammatory change. Spleen: Normal in size without focal abnormality. Adrenals/Urinary Tract: There is focal cortical scarring within the upper pole right kidney. No other focal renal abnormalities. No urinary tract calculi or obstructive uropathy. The adrenals are unremarkable. Bladder is decompressed, limiting its evaluation. Stomach/Bowel: Continued distended fluid-filled stomach. No bowel obstruction or ileus. Normal appendix right mid abdomen. No bowel wall thickening or inflammatory change. Vascular/Lymphatic: Extensive atherosclerosis of the abdominal aorta and its branches. No pathologic adenopathy. Reproductive: Prostate is unremarkable. Other: Large volume ascites throughout the abdomen and pelvis. There are bilateral inguinal hernias again noted, with ascites extending into the patent inguinal canals. No bowel herniation. No free intraperitoneal gas. Musculoskeletal: There are no acute or destructive bony abnormalities. Reconstructed images demonstrate no additional findings. IMPRESSION: 1. Large bilateral pleural effusions with dependent lower lobe compressive atelectasis. 2. Large volume ascites. 3. Bilateral inguinal hernias containing ascites. No bowel herniation. 4. Distended fluid-filled stomach again noted, which could reflect sequela of gastro paresis given history of diabetes. No evidence of bowel obstruction or ileus. 5.  Aortic Atherosclerosis (ICD10-I70.0). Electronically Signed   By: Sharlet Salina M.D.   On: 03/18/2023 15:26     Medications:    albumin human Stopped (03/19/23 0230)   dextrose 5 % and 0.9 % NaCl Stopped (03/19/23 1634)    atorvastatin  20 mg Oral QHS   Chlorhexidine Gluconate Cloth  6 each Topical Q0600   [START ON 03/20/2023] furosemide  40 mg Intravenous BID   heparin  5,000 Units Subcutaneous Q8H   insulin aspart  0-9 Units Subcutaneous TID WC   midodrine  10 mg Oral TID   pantoprazole  40 mg Oral BID   sodium chloride flush  3 mL  Intravenous Q12H   sodium chloride flush  3 mL Intravenous Q12H   acetaminophen **OR** acetaminophen, ipratropium-albuterol, lidocaine (PF), ondansetron **OR** ondansetron (ZOFRAN) IV, sodium chloride flush  Assessment/ Plan:  68 y.o. male with end-stage  renal disease on hemodialysis, hypertension, type 2 diabetes, congestive heart failure, coronary disease, history of stroke, history of alcohol abuse, tobacco abuse, hypothyroidism, ascites was admitted on 03/18/2023 for emesis and abdominal pain.     #Volume overload, ascites #. ESRD-[facility], MWF, 61.5 kg, 195 minutes, CVC #Hypoalbuminemia Paracentesis 3/15 0.5L removed. Dialysis planned for this AM but moved to afternoon due to labile hypoglycemia. Albumin ordered for volume removal and pressure support.  #. Anemia of CKD   Recent Labs       Lab Results  Component Value Date    HGB 14.1 03/19/2023      Low dose EPO with HD for hemoglobin less than 10   #. Secondary hyperparathyroidism of renal origin N 25.81    Labs (Brief)  No results found for: "PTH"   Recent Labs       Lab Results  Component Value Date    PHOS 7.4 (H) 02/21/2023      Phos 5.5 03/19/2023 will start Ca acetate 1tab PO with meals  #C. difficile colitis Patient tested positive for C. difficile on 03/01/2023. He states that he received his antibiotics (oral vancomycin) from the [place] yesterday evening but has not started taking them. He will need reassessment for ongoing/untreated C. difficile.  Will defer to primary team.     #. Hypoglycemia  Reports of overnight labile hypoglycemia. Primary team initiated D5 infusion    LOS: 1 Shawn [name] 3/15/20257:02 PM

## 2023-03-19 NOTE — ED Notes (Signed)
 Pt unable to make urine at this time. Pt refusing catheterization

## 2023-03-19 NOTE — Progress Notes (Signed)
 [facility] Progress Note  Patient: Shawn [name]    EAV:[id number]  DOA: 03/18/2023     Date of Service: the patient was seen and examined on 03/19/2023  Chief Complaint  Patient presents with   Abdominal Pain   Emesis   Brief hospital course: CLEBERT WENGER is a 68 y.o. male with PMH of chronic combined systolic and diastolic congestive heart failure, HTN, CVA, IDDM T2, ESRD on HD MWF schedule, as reviewed from EMR, presented at [facility] ED with complaining of nausea vomiting and diarrhea for 1 day.  As per patient he had diarrhea yesterday but no diarrhea today.  Persistently having nausea and vomiting, could not eat today and did not take his home medications.  Patient was feeling very tired and not well so he did not go for hemodialysis, last hemodialysis was on Monday, 03/14/2023.  Patient reported dark stools and vomiting but no bright red blood noticed. Patient was feeling feeling cold, temperature was 94 in the ED, patient was given warm blankets, temperature improved.  Patient is feeling little bit improvement.  Denied any chest pain or palpitations, no headache or dizziness.   ED Course: VS temp 94 improved to 97.5 after warm blankets HR 67, RR 18, BP 141/85--- 110/74, 98% on room air BMP BG 172, BUN 81, creatinine 5.04 calcium 8.8, albumin 2.5, AST 67 CBC WBC 3.3--4.2, Hb 13.6 stable, PLT is 154 CT A/P: Large bilateral pleural effusion, lower lobe compressive atelectasis, large volume ascites.  Bilateral inguinal hernia, no obstruction or ileus. CXR: Persistent bibasilar consolidation and bilateral effusions, left greater than right.   Assessment and Plan:  # Acute gastroenteritis, Resolved  Diarrhea and vomiting resolved, still having some nausea Presented with weakness and decreased p.o. intake Continue oral hydration and soft diet Monitor electrolytes and replete Continue symptomatic treatment. Started pantoprazole 40 mg p.o. twice daily, prior history of GI  bleed possible Mallory-Weiss tear.  Currently H&H is stable. Follow urine drug screen Patient was feeling weak and tired, check iron, folate, B12, vitamin D level   # Volume overload due to CHF and ESRD Presented with bilateral pleural effusion, large volume ascites and pericardial effusion S/p IV Lasix 40 mg BID, use lasix on non HD days Volume management with hemodialysis as per nephrology     # Bilateral pleural effusion due to CHF and ESRD Continue IV Lasix CT chest:  Moderate to large bilateral pleural effusions with partial lower lobe consolidations, probable passive atelectasis. Cardiomegaly with small pericardial effusion and mild diffuse fluid in the mediastinum. Large volume ascites. Extensive subcutaneous and soft tissue edema.  Aortic atherosclerosis. F/u Thoracentesis by IR  F/u Fluid studies    # Large volume ascites, abdominal soft, no need of paracentesis at this time Continue IV Lasix Fluid management by hemodialysis   # Combined chronic systolic and diastolic CHF exacerbation due to ESRD Continue IV Lasix Hold Coreg for now due to low blood pressure Fluid management by hemodialysis Continue fluid restriction 1.5 L/day Follow TTE   # Hypotension Started midodrine 10 mg p.o. 3 times daily with holding parameters Monitor BP and titrate medication accordingly     # ESRD on HD MWF schedule Presented with volume overload, missed hemodialysis due to feeling sick, last hemodialysis was on 03/14/2023. Nephrology consulted, hemodialysis wil be done today  03/19/2023 Follow nephrology for further management   # HTN, HLD Blood pressure soft, held Coreg for now Continue statin Held aspirin for now   Goals of care discussion done  on admission: Overall prognosis is poor, patient remained full code. Palliative care consulted for goals of care discussion as long-term prognosis is very poor    Body mass index is 21.76 kg/m.  Interventions:  Pressure Injury 09/03/22  Coccyx Mid Stage 1 -  Intact skin with non-blanchable redness of a localized area usually over a bony prominence. (Active)  09/03/22 1743  Location: Coccyx  Location Orientation: Mid  Staging: Stage 1 -  Intact skin with non-blanchable redness of a localized area usually over a bony prominence.  Wound Description (Comments):   Present on Admission: Yes     Diet: Carb modified diet DVT Prophylaxis: Subcutaneous Heparin    Advance goals of care discussion: Full code  Family Communication: family was not present at bedside, at the time of interview.  The pt provided permission to discuss medical plan with the family. Opportunity was given to ask question and all questions were answered satisfactorily.   Disposition:  Pt is from home, admitted with acute gastroenteritis, weakness, Volume overload, P.effusion and ascites, Needs HD and IV diuresis, Thoracentesis, which precludes a safe discharge. Discharge to Home vs SNF TB aftr PT/OT eval, when stable.  Subjective: No significant events overnight, patient was hypothermic in the morning, a bare hugger was given, Temp improved. Pt remained lethargic and sleepy, not very active, denied any complaints   Physical Exam: General: NAD, lying comfortably Appear in no distress, affect appropriate Eyes: PERRLA ENT: Oral Mucosa Clear, moist  Neck: no JVD,  Cardiovascular: S1 and S2 Present, no Murmur,  Respiratory: equal air entry b/l, decreased breath sounds b/l basal area, no wheezes good respiratory effort, Bilateral Air entry equal and Decreased, no Crackles, no wheezes Abdomen: Bowel Sound present, Soft and no tenderness,  Skin: no rashes Extremities: 3-4+ Pedal edema, no calf tenderness Neurologic: without any new focal findings Gait not checked due to patient safety concerns  Vitals:   03/19/23 0804 03/19/23 1000 03/19/23 1153 03/19/23 1323  BP:  119/74  108/65  Pulse:  (!) 55  62  Resp:  10  12  Temp: (!) 93.5 F (34.2 C)  97.6 F  (36.4 C)   TempSrc: Rectal  Oral   SpO2:  99%  95%  Weight:      Height:       No intake or output data in the 24 hours ending 03/19/23 1401 Filed Weights   03/18/23 0944  Weight: 70.8 kg    Data Reviewed: I have personally reviewed and interpreted daily labs, tele strips, imagings as discussed above. I reviewed all nursing notes, pharmacy notes, vitals, pertinent old records I have discussed plan of care as described above with RN and patient/family.  CBC: Recent Labs  Lab 03/18/23 0949 03/18/23 1515 03/19/23 0821  WBC 3.3* 4.2 5.7  NEUTROABS  --  3.4  --   HGB 13.6 13.3 14.1  HCT 41.9 40.8 43.5  MCV 92.1 93.2 93.3  PLT 159 154 156   Basic Metabolic Panel: Recent Labs  Lab 03/18/23 0949 03/19/23 0821  NA 138 132*  K 4.5 5.2*  CL 100 100  CO2 24 22  GLUCOSE 172* 166*  BUN 81* 83*  CREATININE 5.04* 5.29*  CALCIUM 8.8* 8.1*  MG 1.9 1.9  PHOS 5.1* 5.5*    Studies: CT CHEST WO CONTRAST Result Date: 03/18/2023 CLINICAL DATA:  Chronic dyspnea vomiting diarrhea EXAM: CT CHEST WITHOUT CONTRAST TECHNIQUE: Multidetector CT imaging of the chest was performed following the standard protocol without IV contrast. RADIATION DOSE  REDUCTION: This exam was performed according to the departmental dose-optimization program which includes automated exposure control, adjustment of the mA and/or kV according to patient size and/or use of iterative reconstruction technique. COMPARISON:  Chest x-ray 03/18/2023 FINDINGS: Cardiovascular: Limited evaluation without intravenous contrast. Moderate aortic atherosclerosis. No aneurysm. Coronary vascular calcification. Cardiomegaly. Small pericardial effusion. Diffuse fluid in the mediastinum. Right-sided vascular catheter tip in the right atrium. Mediastinum/Nodes: Patent trachea. No thyroid mass. Fluid-filled esophagus. No suspicious lymph nodes Lungs/Pleura: Moderate to large bilateral pleural effusions. Emphysema. Probable scarring at the right  apex. Partial lower lobe consolidations. Upper Abdomen: Large volume ascites Musculoskeletal: No acute or suspicious osseous abnormality. Extensive subcutaneous and soft tissue edema IMPRESSION: 1. Moderate to large bilateral pleural effusions with partial lower lobe consolidations, probable passive atelectasis. 2. Cardiomegaly with small pericardial effusion and mild diffuse fluid in the mediastinum. 3. Large volume ascites. Extensive subcutaneous and soft tissue edema. 4. Aortic atherosclerosis. Aortic Atherosclerosis (ICD10-I70.0). Electronically Signed   By: Jasmine Pang M.D.   On: 03/18/2023 20:41   US Venous Img Lower Bilateral (DVT) Result Date: 03/18/2023 CLINICAL DATA:  Lower extremity edema EXAM: Bilateral LOWER EXTREMITY VENOUS DOPPLER ULTRASOUND TECHNIQUE: Gray-scale sonography with compression, as well as color and duplex ultrasound, were performed to evaluate the deep venous system(s) from the level of the common femoral vein through the popliteal and proximal calf veins. COMPARISON:  02/22/2023 FINDINGS: VENOUS Normal compressibility of the common femoral, superficial femoral, and popliteal veins, as well as the visualized calf veins. Visualized portions of profunda femoral vein and great saphenous vein unremarkable. No filling defects to suggest DVT on grayscale or color Doppler imaging. Doppler waveforms show normal direction of venous flow, normal respiratory plasticity and response to augmentation. OTHER Peripheral vascular disease with extensive atherosclerosis of visualized lower extremity arteries. Limitations: none IMPRESSION: Negative for acute lower extremity DVT. Peripheral vascular disease. Electronically Signed   By: Jasmine Pang M.D.   On: 03/18/2023 20:31   DG Chest Port 1 View Result Date: 03/18/2023 CLINICAL DATA:  Pleural effusion EXAM: PORTABLE CHEST 1 VIEW COMPARISON:  02/19/2023 FINDINGS: Single frontal view of the chest demonstrates stable right internal jugular dialysis  catheter. Cardiac silhouette is enlarged but stable. There are persistent bilateral pleural effusions and bibasilar consolidation, left greater than right. No pneumothorax. No acute bony abnormalities. IMPRESSION: 1. Persistent bibasilar consolidation and bilateral effusions, left greater than right. Electronically Signed   By: Sharlet Salina M.D.   On: 03/18/2023 19:30    Scheduled Meds:  atorvastatin  20 mg Oral QHS   Chlorhexidine Gluconate Cloth  6 each Topical Q0600   [START ON 03/20/2023] furosemide  40 mg Intravenous BID   heparin  5,000 Units Subcutaneous Q8H   insulin aspart  0-9 Units Subcutaneous TID WC   midodrine  10 mg Oral TID   pantoprazole  40 mg Oral BID   sodium chloride flush  3 mL Intravenous Q12H   sodium chloride flush  3 mL Intravenous Q12H   Continuous Infusions:  sodium chloride     albumin human Stopped (03/19/23 0230)   dextrose 5 % and 0.9 % NaCl 50 mL/hr at 03/19/23 0827   PRN Meds: sodium chloride, acetaminophen **OR** acetaminophen, ipratropium-albuterol, ondansetron **OR** ondansetron (ZOFRAN) IV, sodium chloride flush  Time spent: 35 minutes  Author: Gillis Santa. MD [facility] 03/19/2023 2:01 PM  To reach On-call, see care teams to locate the attending and reach out to them via [email or web address]. If 7PM-7AM, please contact night-coverage If you still  have difficulty reaching the attending provider, please page the [facility] (Director on Call) for [facility] on amion for assistance.

## 2023-03-19 NOTE — Progress Notes (Signed)
*  PRELIMINARY RESULTS* Echocardiogram 2D Echocardiogram has been performed.  Shawn [name] Ankur Snowdon 03/19/2023, 10:49 AM

## 2023-03-20 ENCOUNTER — Inpatient Hospital Stay

## 2023-03-20 DIAGNOSIS — E8770 Fluid overload, unspecified: Secondary | ICD-10-CM | POA: Diagnosis not present

## 2023-03-20 LAB — GLUCOSE, CAPILLARY
Glucose-Capillary: 218 mg/dL — ABNORMAL HIGH (ref 70–99)
Glucose-Capillary: 161 mg/dL — ABNORMAL HIGH (ref 70–99)
Glucose-Capillary: 131 mg/dL — ABNORMAL HIGH (ref 70–99)
Glucose-Capillary: 93 mg/dL (ref 70–99)

## 2023-03-20 LAB — PHOSPHORUS: Phosphorus: 2.6 mg/dL (ref 2.5–4.6)

## 2023-03-20 LAB — LACTATE DEHYDROGENASE: LDH: 150 U/L (ref 98–192)

## 2023-03-20 LAB — MAGNESIUM: Magnesium: 1.9 mg/dL (ref 1.7–2.4)

## 2023-03-20 LAB — CBC
HCT: 38.8 % — ABNORMAL LOW (ref 39.0–52.0)
Hemoglobin: 13 g/dL (ref 13.0–17.0)
MCH: 30.3 pg (ref 26.0–34.0)
MCHC: 33.5 g/dL (ref 30.0–36.0)
MCV: 90.4 fL (ref 80.0–100.0)
Platelets: 150 10*3/uL (ref 150–400)
RBC: 4.29 MIL/uL (ref 4.22–5.81)
RDW: 15.6 % — ABNORMAL HIGH (ref 11.5–15.5)
WBC: 4.4 10*3/uL (ref 4.0–10.5)
nRBC: 0 % (ref 0.0–0.2)

## 2023-03-20 LAB — BASIC METABOLIC PANEL
Anion gap: 13 (ref 5–15)
BUN: 32 mg/dL — ABNORMAL HIGH (ref 8–23)
CO2: 24 mmol/L (ref 22–32)
Calcium: 8.5 mg/dL — ABNORMAL LOW (ref 8.9–10.3)
Chloride: 95 mmol/L — ABNORMAL LOW (ref 98–111)
Creatinine, Ser: 2.14 mg/dL — ABNORMAL HIGH (ref 0.61–1.24)
GFR, Estimated: 33 mL/min — ABNORMAL LOW (ref >60)
Glucose, Bld: 134 mg/dL — ABNORMAL HIGH (ref 70–99)
Potassium: 3.2 mmol/L — ABNORMAL LOW (ref 3.5–5.1)
Sodium: 132 mmol/L — ABNORMAL LOW (ref 135–145)

## 2023-03-20 LAB — MRSA NEXT GEN BY PCR, NASAL: MRSA by PCR Next Gen: NOT DETECTED

## 2023-03-20 MED ORDER — FUROSEMIDE 10 MG/ML IJ SOLN
40.0000 mg | Freq: Two times a day (BID) | INTRAMUSCULAR | Status: AC
  Administered 2023-03-24 (×2): 40 mg via INTRAVENOUS
  Filled 2023-03-20 (×2): qty 4

## 2023-03-20 MED ORDER — FUROSEMIDE 10 MG/ML IJ SOLN
40.0000 mg | Freq: Two times a day (BID) | INTRAMUSCULAR | Status: AC
  Administered 2023-03-22 (×2): 40 mg via INTRAVENOUS
  Filled 2023-03-20 (×2): qty 4

## 2023-03-20 MED ORDER — POTASSIUM CHLORIDE CRYS ER 20 MEQ PO TBCR
40.0000 meq | EXTENDED_RELEASE_TABLET | Freq: Once | ORAL | Status: AC
  Administered 2023-03-20: 40 meq via ORAL
  Filled 2023-03-20: qty 2

## 2023-03-20 MED ORDER — CHLORHEXIDINE GLUCONATE CLOTH 2 % EX PADS
6.0000 | MEDICATED_PAD | Freq: Every day | CUTANEOUS | Status: DC
  Administered 2023-03-21 – 2023-03-24 (×3): 6 via TOPICAL

## 2023-03-20 MED ORDER — FUROSEMIDE 10 MG/ML IJ SOLN
40.0000 mg | Freq: Two times a day (BID) | INTRAMUSCULAR | Status: AC
  Administered 2023-03-20 (×2): 40 mg via INTRAVENOUS
  Filled 2023-03-20 (×2): qty 4

## 2023-03-20 NOTE — Evaluation (Signed)
 Physical Therapy Evaluation Patient Details Name: Shawn [name] MRN: [id number] DOB: [birth year] Today's Date: 03/20/2023  History of Present Illness  Shawn [name] is a 68 y.o. male with PMH of chronic combined systolic and diastolic congestive heart failure, HTN, CVA, IDDM T2, ESRD on HD MWF schedule, as reviewed from EMR, presented at [facility] ED with complaining of nausea vomiting and diarrhea for 1 day.  As per patient he had diarrhea yesterday but no diarrhea today.  Persistently having nausea and vomiting, could not eat today and did not take his home medications.  Patient was feeling very tired and not well so he did not go for hemodialysis, last hemodialysis was on Monday, 03/14/2023.   Clinical Impression  Patient received in bed, required mod A to sit up on side of bed. Patient able to sit edge of bed and eat banana. He did become quite fatigued sitting and rested upper body on elevated bed. Attempted to stand with +2 assist max A, patient has very weak legs and is unable to support self at all in standing. He will continue to benefit from skilled PT to improve strength and independence for decreased caregiver burden.         If plan is discharge home, recommend the following: Two people to help with walking and/or transfers;A lot of help with bathing/dressing/bathroom   Can travel by private vehicle   No    Equipment Recommendations Other (comment) (TBD)  Recommendations for Other Services       Functional Status Assessment Patient has had a recent decline in their functional status and demonstrates the ability to make significant improvements in function in a reasonable and predictable amount of time.     Precautions / Restrictions Precautions Precautions: Fall Recall of Precautions/Restrictions: Impaired Restrictions Weight Bearing Restrictions Per Provider Order: No      Mobility  Bed Mobility Overal bed mobility: Needs Assistance Bed Mobility: Supine to Sit,  Sit to Supine     Supine to sit: Mod assist Sit to supine: Mod assist   General bed mobility comments: Mod A to elevate trunk, min A to return legs up to bed.    Transfers Overall transfer level: Needs assistance Equipment used: 2 person hand held assist Transfers: Sit to/from Stand Sit to Stand: +2 physical assistance           General transfer comment: Patient unable to stand at all. Legs too weak despite +2 assist and knee blocking    Ambulation/Gait               General Gait Details: unable  Stairs            Wheelchair Mobility     Tilt Bed    Modified Rankin (Stroke Patients Only)       Balance Overall balance assessment: Needs assistance Sitting-balance support: Feet supported, Single extremity supported Sitting balance-Leahy Scale: Poor Sitting balance - Comments: patient is able to sit unsupported for brief time, but becomes fatigued quickly.   Standing balance support: Bilateral upper extremity supported Standing balance-Leahy Scale: Zero                               Pertinent Vitals/Pain Pain Assessment Pain Assessment: No/denies pain    Home Living   Living Arrangements: Other relatives                 Additional Comments: Lives with sister who provides 24/7 supervision/assist  Prior Function Prior Level of Function : Needs assist             Mobility Comments: Pt primarily uses manual w/c (propels on own); has assist from his sister for w/c level transfers and to walk short distances (no AD use). Reports he has not walked in several weeks ADLs Comments: Sister assist with ADLs (including sponge baths).     Extremity/Trunk Assessment   Upper Extremity Assessment Upper Extremity Assessment: Defer to OT evaluation    Lower Extremity Assessment Lower Extremity Assessment: Generalized weakness    Cervical / Trunk Assessment Cervical / Trunk Assessment: Normal  Communication    Communication Communication: No apparent difficulties    Cognition Arousal: Alert Behavior During Therapy: WFL for tasks assessed/performed   PT - Cognitive impairments: No apparent impairments                         Following commands: Intact       Cueing Cueing Techniques: Verbal cues     General Comments      Exercises     Assessment/Plan    PT Assessment Patient needs continued PT services  PT Problem List Decreased strength;Decreased activity tolerance;Decreased balance;Decreased mobility;Decreased skin integrity       PT Treatment Interventions DME instruction;Functional mobility training;Therapeutic activities;Therapeutic exercise;Balance training;Neuromuscular re-education;Patient/family education;Gait training    PT Goals (Current goals can be found in the Care Plan section)  Acute Rehab PT Goals Patient Stated Goal: none stated PT Goal Formulation: With patient Time For Goal Achievement: 04/03/23    Frequency Min 2X/week     Co-evaluation PT/OT/SLP Co-Evaluation/Treatment: Yes Reason for Co-Treatment: For patient/therapist safety;To address functional/ADL transfers PT goals addressed during session: Mobility/safety with mobility;Balance         AM-PAC PT "6 Clicks" Mobility  Outcome Measure Help needed turning from your back to your side while in a flat bed without using bedrails?: A Little Help needed moving from lying on your back to sitting on the side of a flat bed without using bedrails?: A Lot Help needed moving to and from a bed to a chair (including a wheelchair)?: Total Help needed standing up from a chair using your arms (e.g., wheelchair or bedside chair)?: Total Help needed to walk in hospital room?: Total Help needed climbing 3-5 steps with a railing? : Total 6 Click Score: 9    End of Session   Activity Tolerance: Patient limited by fatigue Patient left: in bed;with call bell/phone within reach;with nursing/sitter in  room Nurse Communication: Mobility status PT Visit Diagnosis: Other abnormalities of gait and mobility (R26.89);Muscle weakness (generalized) (M62.81)    Time: 6578-4696 PT Time Calculation (min) (ACUTE ONLY): 22 min   Charges:   PT Evaluation $PT Eval Moderate Complexity: 1 Mod   PT General Charges $$ ACUTE PT VISIT: 1 Visit         Kahmya Pinkham, PT, GCS 03/20/23,12:45 PM

## 2023-03-20 NOTE — Progress Notes (Signed)
 Received patient in bed.  Alert and oriented Informed consent signed and in chart. 03/20/23  TX duration:3.5 hours  Patient goal was not met, due to hypotension bolus 200 cc was given during the entire tx. Alert, without acute distress.  Hand-off given to patient's nurse.   Access used: dialysis cath Access issues: line was reversed.   Total UF removed: 2200 Medication(s) given: albumin 25% 25g, heplock 1.8units per port Post HD VS: see table below Post HD weight: 65.3kg    03/20/23 0500  Vitals  Temp (!) 96.4 F (35.8 C)  Temp Source Axillary  BP 118/72  MAP (mmHg) 86  BP Location Right Arm  BP Method Automatic  Patient Position (if appropriate) Lying  Pulse Rate 62  Pulse Rate Source Monitor  ECG Heart Rate 64  Resp 15  Oxygen Therapy  SpO2 98 %  O2 Device Room Air  Patient Activity (if Appropriate) In bed  Pulse Oximetry Type Continuous  During Treatment Monitoring  Blood Flow Rate (mL/min) 0 mL/min  Arterial Pressure (mmHg) 22.82 mmHg  Venous Pressure (mmHg) 29.09 mmHg  TMP (mmHg) 5.25 mmHg  Ultrafiltration Rate (mL/min) 0 mL/min  Dialysate Flow Rate (mL/min) 299 ml/min  Dialysate Potassium Concentration 2  Dialysate Calcium Concentration 2.5  Duration of HD Treatment -hour(s) 3.5 hour(s)  Cumulative Fluid Removed (mL) per Treatment  2199.85  HD Safety Checks Performed Yes  Intra-Hemodialysis Comments See progress note  Post Treatment  Dialyzer Clearance Lightly streaked  Hemodialysis Intake (mL) 200 mL  Liters Processed 83.1  Fluid Removed (mL) 2200 mL  Tolerated HD Treatment No (Comment)  Post-Hemodialysis Comments goal not met  Hemodialysis Catheter Right Internal jugular Double lumen Permanent (Tunneled)  No placement date or time found.   Placed prior to admission: Yes  Orientation: Right  Access Location: Internal jugular  Hemodialysis Catheter Type: Double lumen Permanent (Tunneled)  Site Condition No complications  Blue Lumen Status  Flushed;Heparin locked;Dead end cap in place  Red Lumen Status Flushed;Dead end cap in place;Heparin locked  Purple Lumen Status N/A  Catheter fill solution Heparin 1000 units/ml  Catheter fill volume (Arterial) 1.8 cc  Catheter fill volume (Venous) 1.8  Post treatment catheter status Capped and Clamped      [facility]

## 2023-03-20 NOTE — TOC Initial Note (Signed)
 Transition of Care [facility]) - Initial/Assessment Note    Patient Details  Name: Shawn [name] MRN: [id number] Date of Birth: 06-07-1955  Transition of Care [facility]) CM/SW Contact:    [name], LCSW Phone Number: 03/20/2023, 12:03 PM  Clinical Narrative:                 Patient known to [name] for recent admission. See below note from 02/21/22 RNCM: "Admitted from: Home.  States his sister [name] lives with him, and his sister Lauris Poag lives locally for support  PCP: [facility] Current home health/prior home health/DME: [facility] services 5 days a week, 3-4 hours a day Patient states that [facility]"  Expected Discharge Plan: Home/Self Care Barriers to Discharge: Continued Medical Work up   Patient Goals and CMS Choice            Expected Discharge Plan and Services       Living arrangements for the past 2 months: Single Family Home                                      Prior Living Arrangements/Services Living arrangements for the past 2 months: Single Family Home Lives with:: Siblings              Current home services: DME, Homehealth aide    Activities of Daily Living   ADL Screening (condition at time of admission) Independently performs ADLs?: Yes (appropriate for developmental age) Is the patient deaf or have difficulty hearing?: No Does the patient have difficulty seeing, even when wearing glasses/contacts?: No Does the patient have difficulty concentrating, remembering, or making decisions?: No  Permission Sought/Granted                  Emotional Assessment              Admission diagnosis:  Lower abdominal pain [R10.30] Volume overload [E87.70] Nausea vomiting and diarrhea [R11.2, R19.7] Patient Active Problem List   Diagnosis Date Noted   Volume overload 03/18/2023   Acquired hypothyroidism 02/22/2023   GI bleed 02/19/2023   Palliative care encounter 11/14/2022   Hypervolemia 11/10/2022   Diarrhea  11/10/2022   Diabetic ketoacidosis without coma associated with type 2 diabetes mellitus ([name]) 10/27/2022   Sepsis ([name]) 09/03/2022   HFrEF (heart failure with reduced ejection fraction) ([name]) 09/03/2022   Nausea and vomiting 08/30/2022   ESRD on dialysis ([name]) 08/29/2022   Acute exacerbation of CHF (congestive heart failure) ([name]) 08/13/2021   Iron deficiency anemia 07/01/2021   Hyperosmolar hyperglycemic state (HHS) ([name]) 03/16/2020   Acute metabolic encephalopathy 03/16/2020   Elevated troponin 03/16/2020   HLD (hyperlipidemia) 03/16/2020   Abnormal CXR 03/16/2020   Hypothermia 03/16/2020   Hypotension 04/30/2019   Stroke ([name])    Protein-calorie malnutrition, severe ([name]) 04/25/2019   Hyperkalemia 04/23/2019   Acute kidney injury superimposed on CKD 3b ([name]) 04/23/2019   C. difficile colitis 04/23/2019   Hypoglycemia 09/22/2018   Essential hypertension 05/12/2018   Chronic HFrEF (heart failure with reduced ejection fraction) ([name]) 12/15/2017   NICM (nonischemic cardiomyopathy) ([name]) 05/13/2017   T2DM (type 2 diabetes mellitus) ([name]) 05/13/2017   Smoker 05/13/2017   Alcohol abuse 05/13/2017   PCP:  [facility]:   [facility] - [place], [place] - [address] [place] [address] Phone: [phone] Fax:  [phone]  [facility] [address] ([address]), [place] - [address] [place] ([place]) [place] [address] Phone: [phone] Fax: [phone]  [facility] [address] [place] [address] Phone: [phone] Fax: [phone]  [facility] [place], [place] - [address] [place] [address] Phone: [phone] Fax: [phone]     Social Drivers of Health (SDOH) Social History: SDOH Screenings   Food Insecurity: No Food Insecurity (03/19/2023)  Housing: Low Risk  (03/19/2023)  Recent  Concern: Housing - High Risk (02/20/2023)  Transportation Needs: No Transportation Needs (03/19/2023)  Recent Concern: Transportation Needs - Unmet Transportation Needs (02/20/2023)  Utilities: Not At Risk (03/19/2023)  Recent Concern: Utilities - At Risk (02/20/2023)  Financial Resource Strain: High Risk (08/24/2019)   Received from [facility], [facility]  Social Connections: Patient Declined (03/19/2023)  Recent Concern: Social Connections - Moderately Isolated (02/20/2023)  Stress: No Stress Concern Present (08/24/2019)   [facility] from [facility], [facility]  Tobacco Use: High Risk (03/18/2023)   SDOH Interventions:     Readmission Risk Interventions    03/20/2023   12:02 PM 02/22/2023   10:15 AM 09/04/2022   11:28 AM  Readmission Risk Prevention Plan  Transportation Screening Complete Complete Complete  PCP or Specialist Appt within 3-5 Days   Complete  HRI or Home Care Consult   Complete  Social Work Consult for Recovery Care Planning/Counseling   Complete  Palliative Care Screening   Not Applicable  Medication Review Oceanographer) Complete Complete Complete  PCP or Specialist appointment within 3-5 days of discharge Complete    HRI or Home Care Consult  Patient refused   SW Recovery Care/Counseling Consult Complete Complete   Palliative Care Screening Not Applicable    Skilled Nursing Facility Not Applicable Not Applicable

## 2023-03-20 NOTE — Plan of Care (Signed)
  Problem: Education: Goal: Ability to describe self-care measures that may prevent or decrease complications (Diabetes Survival Skills Education) will improve Outcome: Progressing Goal: Individualized Educational Video(s) Outcome: Progressing   Problem: Fluid Volume: Goal: Ability to maintain a balanced intake and output will improve Outcome: Progressing   Problem: Skin Integrity: Goal: Risk for impaired skin integrity will decrease Outcome: Progressing   Problem: Activity: Goal: Risk for activity intolerance will decrease Outcome: Progressing   Problem: Nutrition: Goal: Adequate nutrition will be maintained Outcome: Progressing   Problem: Pain Managment: Goal: General experience of comfort will improve and/or be controlled Outcome: Progressing

## 2023-03-20 NOTE — Progress Notes (Signed)
 [facility] Progress Note  Patient: Shawn [name]    NFA:[id number]  DOA: 03/18/2023     Date of Service: the patient was seen and examined on 03/20/2023  Chief Complaint  Patient presents with   Abdominal Pain   Emesis   Brief hospital course: Shawn [name] is a 68 y.o. male with PMH of chronic combined systolic and diastolic congestive heart failure, HTN, CVA, IDDM T2, ESRD on HD MWF schedule, as reviewed from EMR, presented at [facility] ED with complaining of nausea vomiting and diarrhea for 1 day.  As per patient he had diarrhea yesterday but no diarrhea today.  Persistently having nausea and vomiting, could not eat today and did not take his home medications.  Patient was feeling very tired and not well so he did not go for hemodialysis, last hemodialysis was on Monday, 03/14/2023.  Patient reported dark stools and vomiting but no bright red blood noticed. Patient was feeling feeling cold, temperature was 94 in the ED, patient was given warm blankets, temperature improved.  Patient is feeling little bit improvement.  Denied any chest pain or palpitations, no headache or dizziness.   ED Course: VS temp 94 improved to 97.5 after warm blankets HR 67, RR 18, BP 141/85--- 110/74, 98% on room air BMP BG 172, BUN 81, creatinine 5.04 calcium 8.8, albumin 2.5, AST 67 CBC WBC 3.3--4.2, Hb 13.6 stable, PLT is 154 CT A/P: Large bilateral pleural effusion, lower lobe compressive atelectasis, large volume ascites.  Bilateral inguinal hernia, no obstruction or ileus. CXR: Persistent bibasilar consolidation and bilateral effusions, left greater than right.   Assessment and Plan:  # Acute gastroenteritis, Resolved  Diarrhea and vomiting resolved, still having some nausea Presented with weakness and decreased p.o. intake Continue oral hydration and soft diet Monitor electrolytes and replete Continue symptomatic treatment. Started pantoprazole 40 mg p.o. twice daily, prior history of GI  bleed possible Mallory-Weiss tear.  Currently H&H is stable. Follow urine drug screen Patient was feeling weak and tired, check iron, folate, B12, vitamin D level   # Volume overload due to CHF and ESRD Presented with bilateral pleural effusion, large volume ascites and pericardial effusion S/p IV Lasix 40 mg BID, use lasix on non HD days Volume management with hemodialysis as per nephrology     # Bilateral pleural effusion due to CHF and ESRD Continue IV Lasix on hemodialysis days CT chest:  Moderate to large bilateral pleural effusions with partial lower lobe consolidations, probable passive atelectasis. Cardiomegaly with small pericardial effusion and mild diffuse fluid in the mediastinum. Large volume ascites. Extensive subcutaneous and soft tissue edema.  Aortic atherosclerosis. 3/15 s/p Left Thora 500 ml fluid was tapped  F/u Thoracentesis by IR on right side    # Liver cirrhosis with large volume ascites, abdominal soft, no need of paracentesis at this time Continue IV Lasix on nondialysis days Fluid management by hemodialysis   # Combined chronic systolic and diastolic CHF exacerbation due to ESRD Lower extremity edema bilaterally, venous duplex negative for DVT Continue IV Lasix on nondialysis days Hold Coreg for now due to low blood pressure Fluid management by hemodialysis Continue fluid restriction 1.5 L/day TTE LVEF 20 to 25%, LV demonstrates regional wall motion abnormalities, severely enlarged LA, no significant valvular abnormality.     # Hypotension Started midodrine 10 mg p.o. 3 times daily with holding parameters Monitor BP and titrate medication accordingly     # ESRD on HD MWF schedule Presented with volume overload, missed hemodialysis  due to feeling sick, last hemodialysis was on 03/14/2023. Nephrology consulted, hemodialysis wil be done today  03/19/2023 Follow nephrology for further management   # Hypokalemia, due to diuresis Potassium repleted  cautiously due to ESRD Monitor electrolytes and replete as needed  # HTN, HLD Blood pressure soft, held Coreg for now Continue statin Held aspirin for now   Goals of care discussion done on admission: 3/16 d/w POA pts sister over the phone and she stated that he is DNR/DNI     Body mass index is 20.08 kg/m.  Interventions:  Pressure Injury 09/03/22 Coccyx Mid Stage 1 -  Intact skin with non-blanchable redness of a localized area usually over a bony prominence. (Active)  09/03/22 1743  Location: Coccyx  Location Orientation: Mid  Staging: Stage 1 -  Intact skin with non-blanchable redness of a localized area usually over a bony prominence.  Wound Description (Comments):   Present on Admission: Yes     Diet: Carb modified diet DVT Prophylaxis: Subcutaneous Heparin    Advance goals of care discussion: DNR/DNI  Family Communication: family was not present at bedside, at the time of interview.  The pt provided permission to discuss medical plan with the family. Opportunity was given to ask question and all questions were answered satisfactorily.  3/16 d/w pt's sister over the phone  Disposition:  Pt is from home, admitted with acute gastroenteritis, weakness, Volume overload, P.effusion and ascites, Needs HD and IV diuresis, Thoracentesis, which precludes a safe discharge. Discharge to Home vs SNF TB aftr PT/OT eval, when stable.  Subjective: No significant events overnight, pt was resting comfortably in the bed, remained somnolent and sleepy, denies any complaints    Physical Exam: General: NAD, lying comfortably Appear in no distress, affect appropriate Eyes: PERRLA ENT: Oral Mucosa Clear, moist  Neck: no JVD,  Cardiovascular: S1 and S2 Present, no Murmur,  Respiratory: equal air entry b/l, decreased breath sounds b/l basal area, no wheezes good respiratory effort, Bilateral Air entry equal and Decreased, no Crackles, no wheezes Abdomen: Bowel Sound present, Soft and  no tenderness,  Skin: no rashes Extremities: 3-4+ Pedal edema, no calf tenderness Neurologic: without any new focal findings Gait not checked due to patient safety concerns  Vitals:   03/20/23 0456 03/20/23 0500 03/20/23 0502 03/20/23 0826  BP: 111/67 118/72  121/76  Pulse: 62 62  73  Resp: 14 15    Temp:  (!) 96.4 F (35.8 C)  97.7 F (36.5 C)  TempSrc:  Axillary  Oral  SpO2: 99% 98%  98%  Weight:   65.3 kg   Height:        Intake/Output Summary (Last 24 hours) at 03/20/2023 1319 Last data filed at 03/20/2023 0500 Gross per 24 hour  Intake 449.47 ml  Output 2200 ml  Net -1750.53 ml   Filed Weights   03/18/23 0944 03/20/23 0021 03/20/23 0502  Weight: 70.8 kg 67.6 kg 65.3 kg    Data Reviewed: I have personally reviewed and interpreted daily labs, tele strips, imagings as discussed above. I reviewed all nursing notes, pharmacy notes, vitals, pertinent old records I have discussed plan of care as described above with RN and patient/family.  CBC: Recent Labs  Lab 03/18/23 0949 03/18/23 1515 03/19/23 0821 03/20/23 0443  WBC 3.3* 4.2 5.7 4.4  NEUTROABS  --  3.4  --   --   HGB 13.6 13.3 14.1 13.0  HCT 41.9 40.8 43.5 38.8*  MCV 92.1 93.2 93.3 90.4  PLT 159 154  156 150   Basic Metabolic Panel: Recent Labs  Lab 03/18/23 0949 03/19/23 0821 03/20/23 0443  NA 138 132* 132*  K 4.5 5.2* 3.2*  CL 100 100 95*  CO2 24 22 24   GLUCOSE 172* 166* 134*  BUN 81* 83* 32*  CREATININE 5.04* 5.29* 2.14*  CALCIUM 8.8* 8.1* 8.5*  MG 1.9 1.9 1.9  PHOS 5.1* 5.5* 2.6    Studies: DG Chest Port 1 View Result Date: 03/19/2023 CLINICAL DATA:  142230 Pleural effusion 142230 161096 S/P thoracentesis [id number]. EXAM: PORTABLE CHEST 1 VIEW COMPARISON:  03/18/2023. FINDINGS: There is significant interval decrease in the left pleural effusion, status post thoracentesis. No pneumothorax. There are residual atelectatic changes at the left lung base. Bilateral lung fields are otherwise clear.  Right lateral costophrenic angle is clear. Stable cardio-mediastinal silhouette. No acute osseous abnormalities. The soft tissues are within normal limits. Right IJ hemodialysis catheter noted with its tip overlying the cavoatrial junction region. IMPRESSION: Significant interval decrease in the left pleural effusion, status post thoracentesis. No pneumothorax. Electronically Signed   By: Jules Schick M.D.   On: 03/19/2023 14:59   US THORACENTESIS ASP PLEURAL SPACE W/IMG GUIDE Result Date: 03/19/2023 INDICATION: 72 you male with bilateral pleural effusions on recent imaging. IR was requested for diagnostic and therapeutic thoracentesis. EXAM: ULTRASOUND GUIDED DIAGNOSTIC AND THERAPEUTIC LEFT-SIDED THORACENTESIS MEDICATIONS: 6 cc OF 1% lidocaine COMPLICATIONS: None immediate. PROCEDURE: An ultrasound guided thoracentesis was thoroughly discussed with the patient and questions answered. The benefits, risks, alternatives and complications were also discussed. The patient understands and wishes to proceed with the procedure. Written consent was obtained. Ultrasound was performed to localize and mark an adequate pocket of fluid in the left chest. The area was then prepped and draped in the normal sterile fashion. 1% Lidocaine was used for local anesthesia. Under ultrasound guidance a 6 Fr Safe-T-Centesis catheter was introduced. Thoracentesis was performed. The catheter was removed and a dressing applied. FINDINGS: A total of approximately 550 cc of clear, straw-colored pleural fluid was removed. Samples were sent to the laboratory as requested by the clinical team. Right thorax not amenable to thoracentesis at this time due to low volume on ultrasound. IMPRESSION: Successful ultrasound guided left thoracentesis yielding 500 cc of pleural fluid. Preformed by Buzzy Han, PA-C. Electronically Signed   By: Irish Lack M.D.   On: 03/19/2023 14:44    Scheduled Meds:  atorvastatin  20 mg Oral QHS   calcium  acetate  667 mg Oral TID WC   Chlorhexidine Gluconate Cloth  6 each Topical Q0600   furosemide  40 mg Intravenous BID   [START ON 03/22/2023] furosemide  40 mg Intravenous BID   [START ON 03/24/2023] furosemide  40 mg Intravenous BID   heparin  5,000 Units Subcutaneous Q8H   insulin aspart  0-9 Units Subcutaneous TID WC   midodrine  10 mg Oral TID   pantoprazole  40 mg Oral BID   sodium chloride flush  3 mL Intravenous Q12H   sodium chloride flush  3 mL Intravenous Q12H   Continuous Infusions:   PRN Meds: acetaminophen **OR** acetaminophen, ipratropium-albuterol, lidocaine (PF), ondansetron **OR** ondansetron (ZOFRAN) IV, sodium chloride flush  Time spent: 55 minutes  Author: Gillis Santa. MD [facility] 03/20/2023 1:19 PM  To reach On-call, see care teams to locate the attending and reach out to them via [email or web address]. If 7PM-7AM, please contact night-coverage If you still have difficulty reaching the attending provider, please page the [facility] (Director on Call) for [facility] on amion for assistance.

## 2023-03-20 NOTE — Evaluation (Signed)
 Occupational Therapy Evaluation Patient Details Name: Shawn [name] MRN: [id number] DOB: [birth year] Today's Date: 03/20/2023   History of Present Illness   DIANE MOCHIZUKI is a 68 y.o. male with PMH of chronic combined systolic and diastolic congestive heart failure, HTN, CVA, IDDM T2, ESRD on HD MWF schedule, as reviewed from EMR, presented at [facility] ED with complaining of nausea vomiting and diarrhea for 1 day. Persistently having nausea and vomiting, could not eat today and did not take his home medications.  Patient did not go for hemodialysis, last hemodialysis was on Monday, 03/14/2023.   Clinical Impressions Mr Daughety was seen for OT evaluation this date. Prior to hospital admission, pt requires assist for w/c t/fs. Pt lives with sister who provides assist. On arrival pt seated EOB with PT. Pt currently requires SETUP seated self-feeding with encouragement. Attempted standing, TOTAL A x2 sit<>stand, B knee buckling and returned to bed. MOD A rolling bed level for brief change. Pt would benefit from skilled OT to address noted impairments and functional limitations (see below for any additional details). Upon hospital discharge, recommend OT follow up.    If plan is discharge home, recommend the following:   Two people to help with walking and/or transfers;Two people to help with bathing/dressing/bathroom     Functional Status Assessment   Patient has had a recent decline in their functional status and demonstrates the ability to make significant improvements in function in a reasonable and predictable amount of time.     Equipment Recommendations   Wheelchair (measurements OT);Hospital bed     Recommendations for Other Services         Precautions/Restrictions   Precautions Precautions: Fall Recall of Precautions/Restrictions: Impaired Restrictions Weight Bearing Restrictions Per Provider Order: No     Mobility Bed Mobility Overal bed mobility: Needs  Assistance Bed Mobility: Sit to Supine, Rolling Rolling: Mod assist     Sit to supine: Mod assist        Transfers Overall transfer level: Needs assistance Equipment used: 2 person hand held assist Transfers: Sit to/from Stand Sit to Stand: Total assist, +2 physical assistance           General transfer comment: B knee buckling      Balance Overall balance assessment: Needs assistance Sitting-balance support: Feet supported, Single extremity supported Sitting balance-Leahy Scale: Poor     Standing balance support: Bilateral upper extremity supported Standing balance-Leahy Scale: Zero                             ADL either performed or assessed with clinical judgement   ADL Overall ADL's : Needs assistance/impaired                                       General ADL Comments: SETUP seated self-feeding with encouragement. MOD A rolling bed level for brief change.        Pertinent Vitals/Pain Pain Assessment Pain Assessment: No/denies pain     Extremity/Trunk Assessment Upper Extremity Assessment Upper Extremity Assessment: Generalized weakness   Lower Extremity Assessment Lower Extremity Assessment: Generalized weakness   Cervical / Trunk Assessment Cervical / Trunk Assessment: Normal   Communication Communication Communication: Impaired Factors Affecting Communication: Reduced clarity of speech   Cognition Arousal: Alert Behavior During Therapy: WFL for tasks assessed/performed Cognition: No family/caregiver present to determine baseline  OT - Cognition Comments: ? cognition vs garbled speech. follows all commands                 Following commands: Intact       Cueing  General Comments   Cueing Techniques: Verbal cues      Exercises     Shoulder Instructions      Home Living Family/patient expects to be discharged to:: Private residence Living Arrangements: Other relatives  (sister) Available Help at Discharge: Family;Available 24 hours/day Type of Home: House Home Access: Ramped entrance     Home Layout: One level     Bathroom Shower/Tub: Chief Strategy Officer: Handicapped height Bathroom Accessibility: No   Home Equipment: Scientist, research (medical) (4 wheels);Cane - single Information systems manager (2 wheels)   Additional Comments: Lives with sister who provides 24/7 supervision/assist      Prior Functioning/Environment Prior Level of Function : Needs assist             Mobility Comments: Pt primarily uses manual w/c (propels on own); has assist from his sister for w/c level transfers and to walk short distances (no AD use). Reports he has not walked in several weeks ADLs Comments: Sister assist with ADLs (including sponge baths).    OT Problem List: Decreased strength;Decreased range of motion;Decreased activity tolerance;Impaired balance (sitting and/or standing)   OT Treatment/Interventions: Self-care/ADL training;Therapeutic exercise;Energy conservation;DME and/or AE instruction;Therapeutic activities      OT Goals(Current goals can be found in the care plan section)   Acute Rehab OT Goals Patient Stated Goal: to go home OT Goal Formulation: With patient Time For Goal Achievement: 04/03/23 Potential to Achieve Goals: Fair ADL Goals Pt Will Perform Grooming: with set-up;with supervision;sitting Pt Will Perform Lower Body Dressing: with min assist;with caregiver independent in assisting;sitting/lateral leans Pt Will Transfer to Toilet: with mod assist;squat pivot transfer;anterior/posterior transfer;bedside commode   OT Frequency:  Min 1X/week    Co-evaluation PT/OT/SLP Co-Evaluation/Treatment: Yes Reason for Co-Treatment: For patient/therapist safety;To address functional/ADL transfers PT goals addressed during session: Mobility/safety with mobility;Balance OT goals addressed during session: ADL's and  self-care      AM-PAC OT "6 Clicks" Daily Activity     Outcome Measure Help from another person eating meals?: None Help from another person taking care of personal grooming?: A Little Help from another person toileting, which includes using toliet, bedpan, or urinal?: A Lot Help from another person bathing (including washing, rinsing, drying)?: A Lot Help from another person to put on and taking off regular upper body clothing?: A Little Help from another person to put on and taking off regular lower body clothing?: A Lot 6 Click Score: 16   End of Session Nurse Communication: Mobility status  Activity Tolerance: Patient tolerated treatment well Patient left: in bed;with call bell/phone within reach;with nursing/sitter in room  OT Visit Diagnosis: Other abnormalities of gait and mobility (R26.89);Muscle weakness (generalized) (M62.81)                Time: 8119-1478 OT Time Calculation (min): 15 min Charges:  OT General Charges $OT Visit: 1 Visit OT Evaluation $OT Eval Low Complexity: 1 Low  Kathie Dike, M.S. OTR/L  03/20/23, 1:07 PM  ascom [phone]

## 2023-03-20 NOTE — Progress Notes (Signed)
 [facility]  ROUNDING NOTE   Subjective:  Patient came in with complaints of nausea, vomiting, and diarrhea for 1 day prior to admission.  Also found to have volume overload and shortness of breath.  He was found to have bilateral pleural effusion and underwent left-sided thoracentesis yesterday and dialysis afterwards.    Patient is known to our practice and receives outpatient dialysis treatments at [facility] on a MWF schedule, supervised by Dr Thedore Mins . Patient is seen resting in his room. Reports feeling tired after a busy day yesterday. Noted meal tray untouched and encouraged PO intake.   Objective:  Vital signs in last 24 hours:  Temp:  [96.4 F (35.8 C)-97.7 F (36.5 C)] 97.5 F (36.4 C) (03/16 1555) Pulse Rate:  [60-77] 74 (03/16 1555) Resp:  [11-18] 17 (03/16 1555) BP: (78-139)/(54-92) 118/78 (03/16 1555) SpO2:  [90 %-100 %] 96 % (03/16 1555) Weight:  [65.3 kg-67.6 kg] 65.3 kg (03/16 0502)  Weight change: -3.161 kg Filed Weights   03/18/23 0944 03/20/23 0021 03/20/23 0502  Weight: 70.8 kg 67.6 kg 65.3 kg    Intake/Output: I/O last 3 completed shifts: In: 449.5 [I.V.:411.5; IV Piggyback:38] Out: 2200 [Other:2200]   Intake/Output this shift:  No intake/output data recorded.  Physical Exam: General: NAD,   Head: Normocephalic, atraumatic. Moist oral mucosal membranes  Eyes: Anicteric, PERRL  Neck: Supple, trachea midline  Lungs:  Clear to auscultation  Heart: Regular rate and rhythm  Abdomen:  Soft, nontender,   Extremities:  +2/+3 BLE peripheral edema.  Neurologic: Nonfocal, moving all four extremities  Skin: No lesions  Access: Rt chest permcath    Basic Metabolic Panel: Recent Labs  Lab 03/18/23 0949 03/19/23 0821 03/20/23 0443  NA 138 132* 132*  K 4.5 5.2* 3.2*  CL 100 100 95*  CO2 24 22 24   GLUCOSE 172* 166* 134*  BUN 81* 83* 32*  CREATININE 5.04* 5.29* 2.14*  CALCIUM 8.8* 8.1* 8.5*  MG 1.9 1.9 1.9  PHOS 5.1* 5.5* 2.6     Liver Function Tests: Recent Labs  Lab 03/18/23 0949  AST 67*  ALT 37  ALKPHOS 118  BILITOT 0.7  PROT 6.4*  ALBUMIN 2.5*   Recent Labs  Lab 03/18/23 0949  LIPASE 21   No results for input(s): "AMMONIA" in the last 168 hours.  CBC: Recent Labs  Lab 03/18/23 0949 03/18/23 1515 03/19/23 0821 03/20/23 0443  WBC 3.3* 4.2 5.7 4.4  NEUTROABS  --  3.4  --   --   HGB 13.6 13.3 14.1 13.0  HCT 41.9 40.8 43.5 38.8*  MCV 92.1 93.2 93.3 90.4  PLT 159 154 156 150    Cardiac Enzymes: No results for input(s): "CKTOTAL", "CKMB", "CKMBINDEX", "TROPONINI" in the last 168 hours.  BNP: Invalid input(s): "POCBNP"  CBG: Recent Labs  Lab 03/19/23 1135 03/19/23 1622 03/20/23 0815 03/20/23 1230 03/20/23 1556  GLUCAP 147* 216* 218* 161* 131*    Microbiology: Results for orders placed or performed during the hospital encounter of 03/18/23  Body fluid culture w Gram Stain     Status: None (Preliminary result)   Collection Time: 03/19/23  2:46 PM   Specimen: PATH Cytology Pleural fluid  Result Value Ref Range Status   Specimen Description   Final    PLEURAL FLUID Performed at [facility], [address] [place], [place] [address]    Special Requests   Final    NONE Performed at [facility], [address]  [place], [place] [address]    Gram Stain   Final    RARE WBC PRESENT, PREDOMINANTLY MONONUCLEAR NO ORGANISMS SEEN    Culture   Final    NO GROWTH < 24 HOURS Performed at [facility], [address], [place], [place] [address]    Report Status PENDING  Incomplete    Coagulation Studies: No results for input(s): "LABPROT", "INR" in the last 72 hours.  Urinalysis: No results for input(s): "COLORURINE", "LABSPEC", "PHURINE", "GLUCOSEU", "HGBUR", "BILIRUBINUR", "KETONESUR", "PROTEINUR", "UROBILINOGEN", "NITRITE", "LEUKOCYTESUR" in the last 72 hours.  Invalid input(s): "APPERANCEUR"    Imaging: US ARTERIAL ABI (SCREENING LOWER  EXTREMITY) Result Date: 03/20/2023 CLINICAL DATA:  Lower extremity pain EXAM: NONINVASIVE PHYSIOLOGIC VASCULAR STUDY OF BILATERAL LOWER EXTREMITIES TECHNIQUE: Evaluation of both lower extremities were performed at rest, including calculation of ankle-brachial indices with single level Doppler, pressure and pulse volume recording. COMPARISON:  None Available. FINDINGS: Right ABI:  0.7 Left ABI:  0.7 Right Lower Extremity:  Abnormal monophasic arterial waveforms. Left Lower Extremity:  Abnormal monophasic arterial waveforms. 0.5-0.79 Moderate PAD IMPRESSION: Abnormal resting bilateral ankle-brachial indices of 0.7 consistent with at least moderate underlying peripheral arterial disease. Electronically Signed   By: Malachy Moan M.D.   On: 03/20/2023 15:19   DG Chest Port 1 View Result Date: 03/19/2023 CLINICAL DATA:  142230 Pleural effusion 142230 [id number] S/P thoracentesis [id number]. EXAM: PORTABLE CHEST 1 VIEW COMPARISON:  03/18/2023. FINDINGS: There is significant interval decrease in the left pleural effusion, status post thoracentesis. No pneumothorax. There are residual atelectatic changes at the left lung base. Bilateral lung fields are otherwise clear. Right lateral costophrenic angle is clear. Stable cardio-mediastinal silhouette. No acute osseous abnormalities. The soft tissues are within normal limits. Right IJ hemodialysis catheter noted with its tip overlying the cavoatrial junction region. IMPRESSION: Significant interval decrease in the left pleural effusion, status post thoracentesis. No pneumothorax. Electronically Signed   By: Jules Schick M.D.   On: 03/19/2023 14:59   US THORACENTESIS ASP PLEURAL SPACE W/IMG GUIDE Result Date: 03/19/2023 INDICATION: 53 you male with bilateral pleural effusions on recent imaging. IR was requested for diagnostic and therapeutic thoracentesis. EXAM: ULTRASOUND GUIDED DIAGNOSTIC AND THERAPEUTIC LEFT-SIDED THORACENTESIS MEDICATIONS: 6 cc OF 1% lidocaine  COMPLICATIONS: None immediate. PROCEDURE: An ultrasound guided thoracentesis was thoroughly discussed with the patient and questions answered. The benefits, risks, alternatives and complications were also discussed. The patient understands and wishes to proceed with the procedure. Written consent was obtained. Ultrasound was performed to localize and mark an adequate pocket of fluid in the left chest. The area was then prepped and draped in the normal sterile fashion. 1% Lidocaine was used for local anesthesia. Under ultrasound guidance a 6 Fr Safe-T-Centesis catheter was introduced. Thoracentesis was performed. The catheter was removed and a dressing applied. FINDINGS: A total of approximately 550 cc of clear, straw-colored pleural fluid was removed. Samples were sent to the laboratory as requested by the clinical team. Right thorax not amenable to thoracentesis at this time due to low volume on ultrasound. IMPRESSION: Successful ultrasound guided left thoracentesis yielding 500 cc of pleural fluid. Preformed by Buzzy Han, PA-C. Electronically Signed   By: Irish Lack M.D.   On: 03/19/2023 14:44   ECHOCARDIOGRAM COMPLETE Result Date: 03/19/2023    ECHOCARDIOGRAM REPORT   Patient Name:   Shawn [name] Date of Exam: 03/19/2023 Medical Rec #:  [id number]           Height:       71.0  in Accession #:    [id number]          Weight:       156.0 lb Date of Birth:  Aug 06, 1955            BSA:          1.897 m Patient Age:    67 years            BP:           121/82 mmHg Patient Gender: M                   HR:           57 bpm. Exam Location:  [facility] Procedure: 2D Echo, Cardiac Doppler, Color Doppler and Strain Analysis (Both            Spectral and Color Flow Doppler were utilized during procedure). Indications:     CHF-Acute Systolic I50.21  History:         Patient has prior history of Echocardiogram examinations.                  Stroke; Risk Factors:Hypertension.  Sonographer:     Neysa Bonito Roar Referring Phys:   [id number] Gillis Santa Diagnosing Phys: Chilton Si MD  Sonographer Comments: Global longitudinal strain was attempted. IMPRESSIONS  1. Left ventricular ejection fraction, by estimation, is 20 to 25%. The left ventricle has severely decreased function. The left ventricle demonstrates regional wall motion abnormalities (see scoring diagram/findings for description). There is mild concentric left ventricular hypertrophy. The average left ventricular global longitudinal strain is -5.7 %. The global longitudinal strain is abnormal.  2. Right ventricular systolic function is normal. The right ventricular size is normal. There is normal pulmonary artery systolic pressure.  3. Left atrial size was severely dilated.  4. A small pericardial effusion is present. The pericardial effusion is localized near the right atrium. Large pleural effusion in the left lateral region.  5. The mitral valve is normal in structure. Mild mitral valve regurgitation. No evidence of mitral stenosis.  6. The aortic valve is tricuspid. Aortic valve regurgitation is not visualized. No aortic stenosis is present.  7. The inferior vena cava is normal in size with greater than 50% respiratory variability, suggesting right atrial pressure of 3 mmHg. FINDINGS  Left Ventricle: Left ventricular ejection fraction, by estimation, is 20 to 25%. The left ventricle has severely decreased function. The left ventricle demonstrates regional wall motion abnormalities. The average left ventricular global longitudinal strain is -5.7 %. Strain was performed and the global longitudinal strain is abnormal. The left ventricular internal cavity size was normal in size. There is mild concentric left ventricular hypertrophy.  LV Wall Scoring: The entire inferior wall, mid inferoseptal segment, and basal inferoseptal segment are akinetic. The entire anterior wall, entire lateral wall, entire anterior septum, and apex are hypokinetic. Right Ventricle: The right  ventricular size is normal. No increase in right ventricular wall thickness. Right ventricular systolic function is normal. There is normal pulmonary artery systolic pressure. The tricuspid regurgitant velocity is 2.23 m/s, and  with an assumed right atrial pressure of 3 mmHg, the estimated right ventricular systolic pressure is 22.9 mmHg. Left Atrium: Left atrial size was severely dilated. Right Atrium: Right atrial size was normal in size. Pericardium: A small pericardial effusion is present. The pericardial effusion is localized near the right atrium. Mitral Valve: The mitral valve is normal in structure. Mild mitral valve regurgitation. No evidence of mitral valve stenosis.  MV peak gradient, 1.8 mmHg. The mean mitral valve gradient is 1.0 mmHg. Tricuspid Valve: The tricuspid valve is normal in structure. Tricuspid valve regurgitation is trivial. No evidence of tricuspid stenosis. Aortic Valve: The aortic valve is tricuspid. Aortic valve regurgitation is not visualized. No aortic stenosis is present. Aortic valve mean gradient measures 2.0 mmHg. Aortic valve peak gradient measures 3.2 mmHg. Aortic valve area, by VTI measures 1.76 cm. Pulmonic Valve: The pulmonic valve was normal in structure. Pulmonic valve regurgitation is not visualized. No evidence of pulmonic stenosis. Aorta: The aortic root is normal in size and structure. Venous: The inferior vena cava is normal in size with greater than 50% respiratory variability, suggesting right atrial pressure of 3 mmHg. IAS/Shunts: No atrial level shunt detected by color flow Doppler. Additional Comments: There is a large pleural effusion in the left lateral region.  LEFT VENTRICLE PLAX 2D LVIDd:         5.30 cm      Diastology LVIDs:         4.80 cm      LV e' medial:    4.57 cm/s LV PW:         1.20 cm      LV E/e' medial:  16.8 LV IVS:        1.10 cm      LV e' lateral:   3.68 cm/s LVOT diam:     1.70 cm      LV E/e' lateral: 20.9 LV SV:         27 LV SV Index:    14           2D Longitudinal Strain LVOT Area:     2.27 cm     2D Strain GLS (A4C):   -5.0 %                             2D Strain GLS (A3C):   -4.6 %                             2D Strain GLS (A2C):   -7.4 % LV Volumes (MOD)            2D Strain GLS Avg:     -5.7 % LV vol d, MOD A2C: 240.0 ml LV vol d, MOD A4C: 161.0 ml LV vol s, MOD A2C: 193.0 ml LV vol s, MOD A4C: 131.0 ml LV SV MOD A2C:     47.0 ml LV SV MOD A4C:     161.0 ml LV SV MOD BP:      51.0 ml RIGHT VENTRICLE RV Basal diam:  4.00 cm RV Mid diam:    3.60 cm RV S prime:     8.78 cm/s TAPSE (M-mode): 1.8 cm LEFT ATRIUM              Index        RIGHT ATRIUM           Index LA diam:        4.50 cm  2.37 cm/m   RA Area:     17.00 cm LA Vol (A2C):   105.0 ml 55.34 ml/m  RA Volume:   48.30 ml  25.46 ml/m LA Vol (A4C):   81.2 ml  42.80 ml/m LA Biplane Vol: 91.1 ml  48.02 ml/m  AORTIC VALVE  PULMONIC VALVE AV Area (Vmax):    1.69 cm     PV Vmax:          0.85 m/s AV Area (Vmean):   1.36 cm     PV Peak grad:     2.9 mmHg AV Area (VTI):     1.76 cm     PR End Diast Vel: 2.27 msec AV Vmax:           89.90 cm/s   RVOT Peak grad:   1 mmHg AV Vmean:          60.900 cm/s AV VTI:            0.156 m AV Peak Grad:      3.2 mmHg AV Mean Grad:      2.0 mmHg LVOT Vmax:         67.10 cm/s LVOT Vmean:        36.500 cm/s LVOT VTI:          0.121 m LVOT/AV VTI ratio: 0.78  AORTA Ao Root diam: 2.80 cm Ao Asc diam:  3.50 cm MITRAL VALVE               TRICUSPID VALVE MV Area (PHT): 4.99 cm    TR Peak grad:   19.9 mmHg MV Area VTI:   1.23 cm    TR Vmax:        223.00 cm/s MV Peak grad:  1.8 mmHg MV Mean grad:  1.0 mmHg    SHUNTS MV Vmax:       0.68 m/s    Systemic VTI:  0.12 m MV Vmean:      48.3 cm/s   Systemic Diam: 1.70 cm MV Decel Time: 152 msec MV E velocity: 77.00 cm/s MV A velocity: 47.20 cm/s MV E/A ratio:  1.63 MV A Prime:    3.4 cm/s Chilton Si MD Electronically signed by Chilton Si MD Signature Date/Time: 03/19/2023/2:19:15 PM     Final    CT CHEST WO CONTRAST Result Date: 03/18/2023 CLINICAL DATA:  Chronic dyspnea vomiting diarrhea EXAM: CT CHEST WITHOUT CONTRAST TECHNIQUE: Multidetector CT imaging of the chest was performed following the standard protocol without IV contrast. RADIATION DOSE REDUCTION: This exam was performed according to the departmental dose-optimization program which includes automated exposure control, adjustment of the mA and/or kV according to patient size and/or use of iterative reconstruction technique. COMPARISON:  Chest x-ray 03/18/2023 FINDINGS: Cardiovascular: Limited evaluation without intravenous contrast. Moderate aortic atherosclerosis. No aneurysm. Coronary vascular calcification. Cardiomegaly. Small pericardial effusion. Diffuse fluid in the mediastinum. Right-sided vascular catheter tip in the right atrium. Mediastinum/Nodes: Patent trachea. No thyroid mass. Fluid-filled esophagus. No suspicious lymph nodes Lungs/Pleura: Moderate to large bilateral pleural effusions. Emphysema. Probable scarring at the right apex. Partial lower lobe consolidations. Upper Abdomen: Large volume ascites Musculoskeletal: No acute or suspicious osseous abnormality. Extensive subcutaneous and soft tissue edema IMPRESSION: 1. Moderate to large bilateral pleural effusions with partial lower lobe consolidations, probable passive atelectasis. 2. Cardiomegaly with small pericardial effusion and mild diffuse fluid in the mediastinum. 3. Large volume ascites. Extensive subcutaneous and soft tissue edema. 4. Aortic atherosclerosis. Aortic Atherosclerosis (ICD10-I70.0). Electronically Signed   By: Jasmine Pang M.D.   On: 03/18/2023 20:41   US Venous Img Lower Bilateral (DVT) Result Date: 03/18/2023 CLINICAL DATA:  Lower extremity edema EXAM: Bilateral LOWER EXTREMITY VENOUS DOPPLER ULTRASOUND TECHNIQUE: Gray-scale sonography with compression, as well as color and duplex ultrasound, were performed to evaluate the deep venous  system(s)  from the level of the common femoral vein through the popliteal and proximal calf veins. COMPARISON:  02/22/2023 FINDINGS: VENOUS Normal compressibility of the common femoral, superficial femoral, and popliteal veins, as well as the visualized calf veins. Visualized portions of profunda femoral vein and great saphenous vein unremarkable. No filling defects to suggest DVT on grayscale or color Doppler imaging. Doppler waveforms show normal direction of venous flow, normal respiratory plasticity and response to augmentation. OTHER Peripheral vascular disease with extensive atherosclerosis of visualized lower extremity arteries. Limitations: none IMPRESSION: Negative for acute lower extremity DVT. Peripheral vascular disease. Electronically Signed   By: Jasmine Pang M.D.   On: 03/18/2023 20:31   DG Chest Port 1 View Result Date: 03/18/2023 CLINICAL DATA:  Pleural effusion EXAM: PORTABLE CHEST 1 VIEW COMPARISON:  02/19/2023 FINDINGS: Single frontal view of the chest demonstrates stable right internal jugular dialysis catheter. Cardiac silhouette is enlarged but stable. There are persistent bilateral pleural effusions and bibasilar consolidation, left greater than right. No pneumothorax. No acute bony abnormalities. IMPRESSION: 1. Persistent bibasilar consolidation and bilateral effusions, left greater than right. Electronically Signed   By: Sharlet Salina M.D.   On: 03/18/2023 19:30     Medications:     atorvastatin  20 mg Oral QHS   calcium acetate  667 mg Oral TID WC   Chlorhexidine Gluconate Cloth  6 each Topical Q0600   furosemide  40 mg Intravenous BID   [START ON 03/22/2023] furosemide  40 mg Intravenous BID   [START ON 03/24/2023] furosemide  40 mg Intravenous BID   heparin  5,000 Units Subcutaneous Q8H   insulin aspart  0-9 Units Subcutaneous TID WC   midodrine  10 mg Oral TID   pantoprazole  40 mg Oral BID   sodium chloride flush  3 mL Intravenous Q12H   sodium chloride flush  3 mL  Intravenous Q12H   acetaminophen **OR** acetaminophen, ipratropium-albuterol, lidocaine (PF), ondansetron **OR** ondansetron (ZOFRAN) IV, sodium chloride flush  Assessment/ Plan:  68 y.o. male with end-stage renal disease on hemodialysis, hypertension, type 2 diabetes, congestive heart failure, coronary disease, history of stroke, history of alcohol abuse, tobacco abuse, hypothyroidism, ascites was admitted on 03/18/2023 for emesis and abdominal pain.      #Volume overload, ascites #. ESRD-[employer], MWF, 61.5 kg, 195 minutes, CVC #Hypoalbuminemia Paracentesis 3/15 0.5L removed. Dialysis received overnight, 2.2L UF removed. Will resume dialysis schedule and next treatment planned for tomorrow.  #. Anemia of CKD   Recent Labs           Lab Results  Component Value Date    HGB 13 03/20/2023      Low dose EPO with HD for hemoglobin less than 10   #. Secondary hyperparathyroidism of renal origin N 25.81    Labs (Brief)  No results found for: "PTH"    Recent Labs           Lab Results  Component Value Date    PHOS 7.4 (H) 02/21/2023      Phos 5.5 03/19/2023 Continue Ca acetate 1tab PO with meals   #C. difficile colitis Patient tested positive for C. difficile on 03/01/2023. He states that he received his antibiotics (oral vancomycin) from the [place] yesterday evening but has not started taking them. He will need reassessment for ongoing/untreated C. difficile.  Will defer to primary team.     #. Hypokalemia K 3.2, encouraged PO intake. Will improve with dialysis tomorrow on 3K dialysate bath.  LOS: 2 Cherysh Epperly Tonny Bollman 3/16/20253:58 PM

## 2023-03-21 ENCOUNTER — Inpatient Hospital Stay

## 2023-03-21 DIAGNOSIS — Z7189 Other specified counseling: Secondary | ICD-10-CM | POA: Diagnosis not present

## 2023-03-21 DIAGNOSIS — I5023 Acute on chronic systolic (congestive) heart failure: Secondary | ICD-10-CM

## 2023-03-21 DIAGNOSIS — E8770 Fluid overload, unspecified: Secondary | ICD-10-CM | POA: Diagnosis not present

## 2023-03-21 LAB — BASIC METABOLIC PANEL
Anion gap: 13 (ref 5–15)
BUN: 59 mg/dL — ABNORMAL HIGH (ref 8–23)
CO2: 21 mmol/L — ABNORMAL LOW (ref 22–32)
Calcium: 8.5 mg/dL — ABNORMAL LOW (ref 8.9–10.3)
Chloride: 99 mmol/L (ref 98–111)
Creatinine, Ser: 4.03 mg/dL — ABNORMAL HIGH (ref 0.61–1.24)
GFR, Estimated: 16 mL/min — ABNORMAL LOW (ref >60)
Glucose, Bld: 111 mg/dL — ABNORMAL HIGH (ref 70–99)
Potassium: 5.7 mmol/L — ABNORMAL HIGH (ref 3.5–5.1)
Sodium: 133 mmol/L — ABNORMAL LOW (ref 135–145)

## 2023-03-21 LAB — PHOSPHORUS: Phosphorus: 5.2 mg/dL — ABNORMAL HIGH (ref 2.5–4.6)

## 2023-03-21 LAB — PATHOLOGIST SMEAR REVIEW

## 2023-03-21 LAB — CBC
HCT: 34.8 % — ABNORMAL LOW (ref 39.0–52.0)
HCT: 36.8 % — ABNORMAL LOW (ref 39.0–52.0)
Hemoglobin: 11.5 g/dL — ABNORMAL LOW (ref 13.0–17.0)
Hemoglobin: 11.8 g/dL — ABNORMAL LOW (ref 13.0–17.0)
MCH: 30 pg (ref 26.0–34.0)
MCH: 29.5 pg (ref 26.0–34.0)
MCHC: 33 g/dL (ref 30.0–36.0)
MCHC: 32.1 g/dL (ref 30.0–36.0)
MCV: 90.9 fL (ref 80.0–100.0)
MCV: 92 fL (ref 80.0–100.0)
Platelets: 162 10*3/uL (ref 150–400)
Platelets: 149 10*3/uL — ABNORMAL LOW (ref 150–400)
RBC: 3.83 MIL/uL — ABNORMAL LOW (ref 4.22–5.81)
RBC: 4 MIL/uL — ABNORMAL LOW (ref 4.22–5.81)
RDW: 15.9 % — ABNORMAL HIGH (ref 11.5–15.5)
RDW: 15.5 % (ref 11.5–15.5)
WBC: 4.3 10*3/uL (ref 4.0–10.5)
WBC: 3.8 10*3/uL — ABNORMAL LOW (ref 4.0–10.5)
nRBC: 0 % (ref 0.0–0.2)
nRBC: 0 % (ref 0.0–0.2)

## 2023-03-21 LAB — GLUCOSE, CAPILLARY
Glucose-Capillary: 208 mg/dL — ABNORMAL HIGH (ref 70–99)
Glucose-Capillary: 116 mg/dL — ABNORMAL HIGH (ref 70–99)
Glucose-Capillary: 87 mg/dL (ref 70–99)
Glucose-Capillary: 104 mg/dL — ABNORMAL HIGH (ref 70–99)
Glucose-Capillary: 168 mg/dL — ABNORMAL HIGH (ref 70–99)

## 2023-03-21 LAB — MAGNESIUM: Magnesium: 1.9 mg/dL (ref 1.7–2.4)

## 2023-03-21 MED ORDER — ANTICOAGULANT SODIUM CITRATE 4% (200MG/5ML) IV SOLN: 5.0000 mL | Status: DC | PRN

## 2023-03-21 MED ORDER — ALBUMIN HUMAN 25 % IV SOLN
25.0000 g | Freq: Once | INTRAVENOUS | Status: AC
  Administered 2023-03-21: 25 g via INTRAVENOUS
  Filled 2023-03-21: qty 100

## 2023-03-21 MED ORDER — ALBUMIN HUMAN 25 % IV SOLN
INTRAVENOUS | Status: AC
  Filled 2023-03-21: qty 100

## 2023-03-21 MED ORDER — NEPRO/CARBSTEADY PO LIQD: 237.0000 mL | ORAL | Status: DC | PRN

## 2023-03-21 MED ORDER — HEPARIN SODIUM (PORCINE) 1000 UNIT/ML DIALYSIS: 1000.0000 [IU] | INTRAMUSCULAR | Status: DC | PRN

## 2023-03-21 MED ORDER — HEPARIN SODIUM (PORCINE) 1000 UNIT/ML DIALYSIS: 20.0000 [IU]/kg | INTRAMUSCULAR | Status: DC | PRN

## 2023-03-21 MED ORDER — LIDOCAINE-PRILOCAINE 2.5-2.5 % EX CREA: 1.0000 | TOPICAL_CREAM | CUTANEOUS | Status: DC | PRN

## 2023-03-21 MED ORDER — PENTAFLUOROPROP-TETRAFLUOROETH EX AERO: 1.0000 | INHALATION_SPRAY | CUTANEOUS | Status: DC | PRN

## 2023-03-21 MED ORDER — ALTEPLASE 2 MG IJ SOLR: 2.0000 mg | Freq: Once | INTRAMUSCULAR | Status: DC | PRN

## 2023-03-21 MED ORDER — HEPARIN SODIUM (PORCINE) 1000 UNIT/ML DIALYSIS
1000.0000 [IU] | INTRAMUSCULAR | Status: DC | PRN
  Administered 2023-03-21: 1000 [IU]

## 2023-03-21 NOTE — Progress Notes (Signed)
 Consult to HF Navigation Team Placed. Unfortunately, this patient does not meet criteria given ESRD on HD and GDMT is limited. Please feel free to reach out with any questions or medication assistance needs.   Thank you for involving the HF Navigation Team in this patient's care.  Enos Fling, PharmD, BCPS Clinical Pharmacist 08/12/2022 12:50 PM

## 2023-03-21 NOTE — Progress Notes (Signed)
 [facility]  ROUNDING NOTE   Subjective:  Patient came in with complaints of nausea, vomiting, and diarrhea for 1 day prior to admission.  Also found to have volume overload and shortness of breath.  He was found to have bilateral pleural effusion and underwent left-sided thoracentesis yesterday and dialysis afterwards.    Patient is known to our practice and receives outpatient dialysis treatments at [facility] on a MWF schedule, supervised by Dr Thedore Mins.  Update:  Patient seen and evaluated during dialysis treatment today. UF target 2 kg. Resting comfortably in bed.   Objective:  Vital signs in last 24 hours:  Temp:  [97.5 F (36.4 C)-97.9 F (36.6 C)] 97.7 F (36.5 C) (03/17 0810) Pulse Rate:  [63-74] 63 (03/17 1130) Resp:  [13-20] 18 (03/17 1130) BP: (107-128)/(69-93) 123/85 (03/17 1130) SpO2:  [95 %-100 %] 100 % (03/17 1130) Weight:  [67.2 kg] 67.2 kg (03/17 0810)  Weight change:  Filed Weights   03/20/23 0021 03/20/23 0502 03/21/23 0810  Weight: 67.6 kg 65.3 kg 67.2 kg    Intake/Output: I/O last 3 completed shifts: In: 449.5 [I.V.:411.5; IV Piggyback:38] Out: 2200 [Other:2200]   Intake/Output this shift:  No intake/output data recorded.  Physical Exam: General: NAD  Head: Normocephalic, atraumatic. Moist oral mucosal membranes  Eyes: Anicteric  Neck: Supple, trachea midline  Lungs:  Clear to auscultation  Heart: Regular rate and rhythm  Abdomen:  Soft, nontender,   Extremities: 2+ bilateral lower extremity edema  Neurologic: Nonfocal, moving all four extremities  Skin: No lesions  Access: Rt chest permcath    Basic Metabolic Panel: Recent Labs  Lab 03/18/23 0949 03/19/23 0821 03/20/23 0443 03/21/23 0544  NA 138 132* 132* 133*  K 4.5 5.2* 3.2* 5.7*  CL 100 100 95* 99  CO2 24 22 24  21*  GLUCOSE 172* 166* 134* 111*  BUN 81* 83* 32* 59*  CREATININE 5.04* 5.29* 2.14* 4.03*  CALCIUM 8.8* 8.1* 8.5* 8.5*  MG 1.9 1.9 1.9 1.9   PHOS 5.1* 5.5* 2.6 5.2*    Liver Function Tests: Recent Labs  Lab 03/18/23 0949  AST 67*  ALT 37  ALKPHOS 118  BILITOT 0.7  PROT 6.4*  ALBUMIN 2.5*   Recent Labs  Lab 03/18/23 0949  LIPASE 21   No results for input(s): "AMMONIA" in the last 168 hours.  CBC: Recent Labs  Lab 03/18/23 0949 03/18/23 1515 03/19/23 0821 03/20/23 0443 03/21/23 0544  WBC 3.3* 4.2 5.7 4.4 4.3  NEUTROABS  --  3.4  --   --   --   HGB 13.6 13.3 14.1 13.0 11.5*  HCT 41.9 40.8 43.5 38.8* 34.8*  MCV 92.1 93.2 93.3 90.4 90.9  PLT 159 154 156 150 162    Cardiac Enzymes: No results for input(s): "CKTOTAL", "CKMB", "CKMBINDEX", "TROPONINI" in the last 168 hours.  BNP: Invalid input(s): "POCBNP"  CBG: Recent Labs  Lab 03/20/23 0815 03/20/23 1230 03/20/23 1556 03/20/23 2117 03/21/23 0751  GLUCAP 218* 161* 131* 93 116*    Microbiology: Results for orders placed or performed during the hospital encounter of 03/18/23  Body fluid culture w Gram Stain     Status: None (Preliminary result)   Collection Time: 03/19/23  2:46 PM   Specimen: PATH Cytology Pleural fluid  Result Value Ref Range Status   Specimen Description   Final    PLEURAL FLUID Performed at [facility], [address] [place], [place] [address]    Special Requests   Final  NONE Performed at [facility], [address] [place], [place] [address]    Gram Stain   Final    RARE WBC PRESENT, PREDOMINANTLY MONONUCLEAR NO ORGANISMS SEEN    Culture   Final    NO GROWTH 2 DAYS Performed at [facility], [address], [place], [place] [address]    Report Status PENDING  Incomplete  MRSA Next Gen by PCR, Nasal     Status: None   Collection Time: 03/20/23  7:55 PM   Specimen: Nasal Mucosa; Nasal Swab  Result Value Ref Range Status   MRSA by PCR Next Gen NOT DETECTED NOT DETECTED Final    Comment: (NOTE) The GeneXpert MRSA Assay (FDA approved for NASAL specimens only), is one component of a  comprehensive MRSA colonization surveillance program. It is not intended to diagnose MRSA infection nor to guide or monitor treatment for MRSA infections. Test performance is not FDA approved in patients less than 63 years old. Performed at [facility], [address] [place], [place] [address]     Coagulation Studies: No results for input(s): "LABPROT", "INR" in the last 72 hours.  Urinalysis: No results for input(s): "COLORURINE", "LABSPEC", "PHURINE", "GLUCOSEU", "HGBUR", "BILIRUBINUR", "KETONESUR", "PROTEINUR", "UROBILINOGEN", "NITRITE", "LEUKOCYTESUR" in the last 72 hours.  Invalid input(s): "APPERANCEUR"    Imaging: US ARTERIAL ABI (SCREENING LOWER EXTREMITY) Result Date: 03/20/2023 CLINICAL DATA:  Lower extremity pain EXAM: NONINVASIVE PHYSIOLOGIC VASCULAR STUDY OF BILATERAL LOWER EXTREMITIES TECHNIQUE: Evaluation of both lower extremities were performed at rest, including calculation of ankle-brachial indices with single level Doppler, pressure and pulse volume recording. COMPARISON:  None Available. FINDINGS: Right ABI:  0.7 Left ABI:  0.7 Right Lower Extremity:  Abnormal monophasic arterial waveforms. Left Lower Extremity:  Abnormal monophasic arterial waveforms. 0.5-0.79 Moderate PAD IMPRESSION: Abnormal resting bilateral ankle-brachial indices of 0.7 consistent with at least moderate underlying peripheral arterial disease. Electronically Signed   By: Malachy Moan M.D.   On: 03/20/2023 15:19   DG Chest Port 1 View Result Date: 03/19/2023 CLINICAL DATA:  142230 Pleural effusion 142230 [id number] S/P thoracentesis [id number]. EXAM: PORTABLE CHEST 1 VIEW COMPARISON:  03/18/2023. FINDINGS: There is significant interval decrease in the left pleural effusion, status post thoracentesis. No pneumothorax. There are residual atelectatic changes at the left lung base. Bilateral lung fields are otherwise clear. Right lateral costophrenic angle is clear. Stable cardio-mediastinal  silhouette. No acute osseous abnormalities. The soft tissues are within normal limits. Right IJ hemodialysis catheter noted with its tip overlying the cavoatrial junction region. IMPRESSION: Significant interval decrease in the left pleural effusion, status post thoracentesis. No pneumothorax. Electronically Signed   By: Jules Schick M.D.   On: 03/19/2023 14:59   US THORACENTESIS ASP PLEURAL SPACE W/IMG GUIDE Result Date: 03/19/2023 INDICATION: 36 you male with bilateral pleural effusions on recent imaging. IR was requested for diagnostic and therapeutic thoracentesis. EXAM: ULTRASOUND GUIDED DIAGNOSTIC AND THERAPEUTIC LEFT-SIDED THORACENTESIS MEDICATIONS: 6 cc OF 1% lidocaine COMPLICATIONS: None immediate. PROCEDURE: An ultrasound guided thoracentesis was thoroughly discussed with the patient and questions answered. The benefits, risks, alternatives and complications were also discussed. The patient understands and wishes to proceed with the procedure. Written consent was obtained. Ultrasound was performed to localize and mark an adequate pocket of fluid in the left chest. The area was then prepped and draped in the normal sterile fashion. 1% Lidocaine was used for local anesthesia. Under ultrasound guidance a 6 Fr Safe-T-Centesis catheter was introduced. Thoracentesis was performed. The catheter was removed and  a dressing applied. FINDINGS: A total of approximately 550 cc of clear, straw-colored pleural fluid was removed. Samples were sent to the laboratory as requested by the clinical team. Right thorax not amenable to thoracentesis at this time due to low volume on ultrasound. IMPRESSION: Successful ultrasound guided left thoracentesis yielding 500 cc of pleural fluid. Preformed by Buzzy Han, PA-C. Electronically Signed   By: Irish Lack M.D.   On: 03/19/2023 14:44     Medications:    anticoagulant sodium citrate      atorvastatin  20 mg Oral QHS   calcium acetate  667 mg Oral TID WC    Chlorhexidine Gluconate Cloth  6 each Topical Q0600   [START ON 03/22/2023] furosemide  40 mg Intravenous BID   [START ON 03/24/2023] furosemide  40 mg Intravenous BID   heparin  5,000 Units Subcutaneous Q8H   insulin aspart  0-9 Units Subcutaneous TID WC   midodrine  10 mg Oral TID   pantoprazole  40 mg Oral BID   sodium chloride flush  3 mL Intravenous Q12H   sodium chloride flush  3 mL Intravenous Q12H   acetaminophen **OR** acetaminophen, anticoagulant sodium citrate, heparin, ipratropium-albuterol, lidocaine (PF), ondansetron **OR** ondansetron (ZOFRAN) IV, sodium chloride flush  Assessment/ Plan:  68 y.o. male with end-stage renal disease on hemodialysis, hypertension, type 2 diabetes, congestive heart failure, coronary disease, history of stroke, history of alcohol abuse, tobacco abuse, hypothyroidism, ascites was admitted on 03/18/2023 for emesis and abdominal pain.      #Volume overload, ascites #. ESRD-[employer], MWF, 61.5 kg, 195 minutes, CVC #Hypoalbuminemia Patient seen and evaluated during hemodialysis treatment.  Tolerating well.  UF target 2 kg.  Maintain the patient on MWF dialysis treatment schedule.  #. Anemia of CKD   Recent Labs           Lab Results  Component Value Date    HGB 13 03/20/2023      Hemoglobin 13 at last check.  No immediate need for Epogen.   #. Secondary hyperparathyroidism of renal origin N 25.81  Lab Results  Component Value Date   CALCIUM 8.5 (L) 03/21/2023   PHOS 5.2 (H) 03/21/2023  Phosphorus 5.2 and acceptable.  Maintain the patient on calcium acetate.    #C. difficile colitis Patient tested positive for C. difficile on 03/01/2023. He states that he received his antibiotics (oral vancomycin) from the [place] yesterday evening but has not started taking them. He will need reassessment for ongoing/untreated C. difficile.  Will defer to primary team.     #. Hyperkalemia Lab Results  Component Value Date   NA 133 (L)  03/21/2023   K 5.7 (H) 03/21/2023   CO2 21 (L) 03/21/2023   BUN 59 (H) 03/21/2023   CREATININE 4.03 (H) 03/21/2023   CALCIUM 8.5 (L) 03/21/2023   GLUCOSE 111 (H) 03/21/2023  Potassium a bit higher today at 5.7.  Dialyzing patient against a 2K bath.       LOS: 3 Shawn [name] 3/17/202511:39 AM

## 2023-03-21 NOTE — Progress Notes (Signed)
 Pt at risk for pressure injury, he states he hasn't walked in about a month, speciality rotation-[facility] bed order.

## 2023-03-21 NOTE — Progress Notes (Signed)
 Hemodialysis note  Received patient in bed to unit. Alert and oriented x 3.  Informed consent signed and in chart.  Tx Duration: 3.5 hours  Patient tolerated well. Transported back to room, alert without acute distress.  Report given to patient's RN.   Access used: Right Chest HD Catheter Access issues: none  Total UF removed: 2L Medication(s) given:  Albumin 25G IV  Post HD weight: 65 kg   Shawn [name] [facility]

## 2023-03-21 NOTE — Progress Notes (Signed)
 [facility] Progress Note  Patient: Shawn [name]    YQM:[id number]  DOA: 03/18/2023     Date of Service: the patient was seen and examined on 03/21/2023  Chief Complaint  Patient presents with   Abdominal Pain   Emesis   Brief hospital course: DEMETRIES COIA is a 68 y.o. male with PMH of chronic combined systolic and diastolic congestive heart failure, HTN, CVA, IDDM T2, ESRD on HD MWF schedule, as reviewed from EMR, presented at [facility] ED with complaining of nausea vomiting and diarrhea for 1 day.  As per patient he had diarrhea yesterday but no diarrhea today.  Persistently having nausea and vomiting, could not eat today and did not take his home medications.  Patient was feeling very tired and not well so he did not go for hemodialysis, last hemodialysis was on Monday, 03/14/2023.  Patient reported dark stools and vomiting but no bright red blood noticed. Patient was feeling feeling cold, temperature was 94 in the ED, patient was given warm blankets, temperature improved.  Patient is feeling little bit improvement.  Denied any chest pain or palpitations, no headache or dizziness.   ED Course: VS temp 94 improved to 97.5 after warm blankets HR 67, RR 18, BP 141/85--- 110/74, 98% on room air BMP BG 172, BUN 81, creatinine 5.04 calcium 8.8, albumin 2.5, AST 67 CBC WBC 3.3--4.2, Hb 13.6 stable, PLT is 154 CT A/P: Large bilateral pleural effusion, lower lobe compressive atelectasis, large volume ascites.  Bilateral inguinal hernia, no obstruction or ileus. CXR: Persistent bibasilar consolidation and bilateral effusions, left greater than right.   Assessment and Plan:  # Acute gastroenteritis, Resolved  Diarrhea and vomiting resolved, still having some nausea Presented with weakness and decreased p.o. intake Continue oral hydration and soft diet Monitor electrolytes and replete Continue symptomatic treatment. Started pantoprazole 40 mg p.o. twice daily, prior history of GI  bleed possible Mallory-Weiss tear.  Currently H&H is stable. Follow urine drug screen Patient was feeling weak and tired, check iron, folate, B12, vitamin D level   # Volume overload due to CHF and ESRD Presented with bilateral pleural effusion, large volume ascites and pericardial effusion S/p IV Lasix 40 mg BID, use lasix on non HD days Volume management with hemodialysis as per nephrology     # Bilateral pleural effusion due to CHF and ESRD Continue IV Lasix on hemodialysis days CT chest:  Moderate to large bilateral pleural effusions with partial lower lobe consolidations, probable passive atelectasis. Cardiomegaly with small pericardial effusion and mild diffuse fluid in the mediastinum. Large volume ascites. Extensive subcutaneous and soft tissue edema.  Aortic atherosclerosis. 3/15 s/p Left Thora 500 ml fluid was tapped  F/u Thoracentesis by IR on right side    # Liver cirrhosis with large volume ascites, abdominal soft, no need of paracentesis at this time Continue IV Lasix on nondialysis days Fluid management by hemodialysis   # Combined chronic systolic and diastolic CHF exacerbation due to ESRD Lower extremity edema bilaterally, venous duplex negative for DVT Continue IV Lasix on nondialysis days Hold Coreg for now due to low blood pressure Fluid management by hemodialysis Continue fluid restriction 1.5 L/day TTE LVEF 20 to 25%, LV demonstrates regional wall motion abnormalities, severely enlarged LA, no significant valvular abnormality.  3/17 Cardio consulted, rec poor prognosis, agreed with palliative care   # PVD, moderate  ABI 0.7 of lower Ex Vascular Sx rec so surgical intervention, as blood is not compromised and he is not in pain.  As per pts sister he has not walked for more than year now     # Hypotension Started midodrine 10 mg p.o. 3 times daily with holding parameters Monitor BP and titrate medication accordingly     # ESRD on HD MWF  schedule Presented with volume overload, missed hemodialysis due to feeling sick, last hemodialysis was on 03/14/2023. Nephrology consulted, hemodialysis wil be done today  03/19/2023 Follow nephrology for further management   # Hypokalemia, due to diuresis Potassium repleted cautiously due to ESRD Monitor electrolytes and replete as needed  # HTN, HLD Blood pressure soft, held Coreg for now Continue statin Held aspirin for now   Goals of care discussion done on admission: 3/16 d/w POA pts sister over the phone and she stated that he is DNR/DNI 3/17 d/w pt sister, agreed with palliative care and may qualify for hospice care Palliative care consulted  Body mass index is 19.99 kg/m.  Interventions:  Pressure Injury 09/03/22 Coccyx Mid Stage 1 -  Intact skin with non-blanchable redness of a localized area usually over a bony prominence. (Active)  09/03/22 1743  Location: Coccyx  Location Orientation: Mid  Staging: Stage 1 -  Intact skin with non-blanchable redness of a localized area usually over a bony prominence.  Wound Description (Comments):   Present on Admission: Yes     Diet: Carb modified diet DVT Prophylaxis: Subcutaneous Heparin    Advance goals of care discussion: DNR/DNI  Family Communication: family was not present at bedside, at the time of interview.  The pt provided permission to discuss medical plan with the family. Opportunity was given to ask question and all questions were answered satisfactorily.  3/17 d/w pt's sister over the phone  Disposition:  Pt is from home, admitted with acute gastroenteritis, weakness, Volume overload, P.effusion and ascites, Needs HD and IV diuresis, Thoracentesis, which precludes a safe discharge. Discharge to SNF, poor prognosis may need hospice care F/u Palliative care and TOC  Subjective: No significant events overnight, pt was resting comfortably in the bed, denies any complaints    Physical Exam: General: NAD, lying  comfortably Appear in no distress, affect appropriate Eyes: PERRLA ENT: Oral Mucosa Clear, moist  Neck: no JVD,  Cardiovascular: S1 and S2 Present, no Murmur,  Respiratory: equal air entry b/l, decreased breath sounds b/l basal area, no wheezes Abdomen: Bowel Sound present, Soft and no tenderness,  Skin: no rashes Extremities: 3-4+ Pedal edema, no calf tenderness Neurologic: without any new focal findings Gait not checked due to patient safety concerns  Vitals:   03/21/23 1100 03/21/23 1130 03/21/23 1200 03/21/23 1543  BP: 126/86 123/85 121/87 118/77  Pulse: 63 63 64 66  Resp: 14 18 16 16   Temp:   98.4 F (36.9 C)   TempSrc:   Oral   SpO2: 100% 100% 100% 100%  Weight:   65 kg   Height:        Intake/Output Summary (Last 24 hours) at 03/21/2023 1551 Last data filed at 03/21/2023 1200 Gross per 24 hour  Intake --  Output 2000 ml  Net -2000 ml   Filed Weights   03/20/23 0502 03/21/23 0810 03/21/23 1200  Weight: 65.3 kg 67.2 kg 65 kg    Data Reviewed: I have personally reviewed and interpreted daily labs, tele strips, imagings as discussed above. I reviewed all nursing notes, pharmacy notes, vitals, pertinent old records I have discussed plan of care as described above with RN and patient/family.  CBC: Recent Labs  Lab 03/18/23  7829 03/18/23 1515 03/19/23 0821 03/20/23 0443 03/21/23 0544  WBC 3.3* 4.2 5.7 4.4 4.3  NEUTROABS  --  3.4  --   --   --   HGB 13.6 13.3 14.1 13.0 11.5*  HCT 41.9 40.8 43.5 38.8* 34.8*  MCV 92.1 93.2 93.3 90.4 90.9  PLT 159 154 156 150 162   Basic Metabolic Panel: Recent Labs  Lab 03/18/23 0949 03/19/23 0821 03/20/23 0443 03/21/23 0544  NA 138 132* 132* 133*  K 4.5 5.2* 3.2* 5.7*  CL 100 100 95* 99  CO2 24 22 24  21*  GLUCOSE 172* 166* 134* 111*  BUN 81* 83* 32* 59*  CREATININE 5.04* 5.29* 2.14* 4.03*  CALCIUM 8.8* 8.1* 8.5* 8.5*  MG 1.9 1.9 1.9 1.9  PHOS 5.1* 5.5* 2.6 5.2*    Studies: No results found.   Scheduled  Meds:  atorvastatin  20 mg Oral QHS   calcium acetate  667 mg Oral TID WC   Chlorhexidine Gluconate Cloth  6 each Topical Q0600   [START ON 03/22/2023] furosemide  40 mg Intravenous BID   [START ON 03/24/2023] furosemide  40 mg Intravenous BID   heparin  5,000 Units Subcutaneous Q8H   insulin aspart  0-9 Units Subcutaneous TID WC   midodrine  10 mg Oral TID   pantoprazole  40 mg Oral BID   sodium chloride flush  3 mL Intravenous Q12H   sodium chloride flush  3 mL Intravenous Q12H   Continuous Infusions:  anticoagulant sodium citrate      PRN Meds: acetaminophen **OR** acetaminophen, alteplase, anticoagulant sodium citrate, feeding supplement (NEPRO CARB STEADY), heparin, heparin, ipratropium-albuterol, lidocaine-prilocaine, ondansetron **OR** ondansetron (ZOFRAN) IV, pentafluoroprop-tetrafluoroeth, sodium chloride flush  Time spent: 55 minutes  Author: Gillis Santa. MD [facility] 03/21/2023 3:51 PM  To reach On-call, see care teams to locate the attending and reach out to them via [email or web address]. If 7PM-7AM, please contact night-coverage If you still have difficulty reaching the attending provider, please page the [facility] (Director on Call) for [facility] on amion for assistance.

## 2023-03-21 NOTE — Consult Note (Cosign Needed Addendum)
 Consultation Note Date: 03/21/2023   Patient Name: Shawn [name]  DOB: [birth year]  MRN: [id number]  Age / Sex: 68 y.o., male  PCP: [facility] Referring Physician: Gillis Santa, MD  Reason for Consultation: Establishing goals of care  HPI/Patient Profile: Shawn [name] is a 68 y.o. male with PMH of chronic combined systolic and diastolic congestive heart failure, HTN, CVA, IDDM T2, ESRD on HD MWF schedule, as reviewed from EMR, presented at [facility] ED with complaining of nausea vomiting and diarrhea for 1 day.  As per patient he had diarrhea yesterday but no diarrhea today.  Persistently having nausea and vomiting, could not eat today and did not take his home medications.  Patient was feeling very tired and not well so he did not go for hemodialysis, last hemodialysis was on Monday, 03/14/2023.  Patient reported dark stools and vomiting but no bright red blood noticed. Patient was feeling feeling cold, temperature was 94 in the ED, patient was given warm blankets, temperature improved.  Patient is feeling little bit improvement.  Denied any chest pain or palpitations, no headache or dizziness.    Clinical Assessment and Goals of Care: Notes and labs reviewed in detail, including both current notes, as well as notes from previous hospitalizations for missed dialysis with fluid overload, C. difficile, and concern for insulin noncompliance with the need for glucose control.  In to see patient.  He is currently receiving dialysis.  He states his sister lives with him.  He states he no longer drives, and uses a walker for mobility at baseline.  He states at baseline he feels that things have been going well for him.  Patient became sleepy during visit.  PMT will follow-up again tomorrow.     SUMMARY OF RECOMMENDATIONS   PMT will follow-up tomorrow.  Prognosis:  Poor overall       Primary  Diagnoses: Present on Admission:  Volume overload   I have reviewed the medical record, interviewed the patient and family, and examined the patient. The following aspects are pertinent.  Past Medical History:  Diagnosis Date   Chronic combined systolic (congestive) and diastolic (congestive) heart failure ([name])    a. TTE 6/15: EF < 20%, mildly dilated LV, DD, mildly dilated LA, mod dilated RA, mild MR, mild to mod TR, mod increased posterior wall thickness, elevated LA and LVEDP; b. 4/12019 Echo: EF 20-25%, diff HK. Gr1 DD, nl RV fxn.   CKD (chronic kidney disease), stage II    Diabetes mellitus with complication ([name])    a. Prior admissions w/ DKA (last 12/2017).   Essential hypertension    NICM (nonischemic cardiomyopathy) ([name])    a. 06/2013 Echo: EF<20%; b. 06/2013 Cath: no signif dzs; c. 04/2017 Echo: 20-25%, gr1 DD.; d.05/2018 Echo: 25-30%   Polysubstance abuse ([name])    a. etoh and tobacco   Stroke [facility])    Social History   Socioeconomic History   Marital status: Widowed    Spouse name: Not on file   Number of  children: Not on file   Years of education: Not on file   Highest education level: Not on file  Occupational History   Not on file  Tobacco Use   Smoking status: Every Day    Current packs/day: 0.50    Types: Cigarettes   Smokeless tobacco: Never  Vaping Use   Vaping status: Never Used  Substance and Sexual Activity   Alcohol use: Not Currently   Drug use: Never   Sexual activity: Not Currently  Other Topics Concern   Not on file  Social History Narrative   Not on file   Social Drivers of Health   Financial Resource Strain: High Risk (08/24/2019)   Received from [facility], [facility]   Overall Financial Resource Strain (CARDIA)    Difficulty of Paying Living Expenses: Very hard  Food Insecurity: No Food Insecurity (03/19/2023)   Hunger Vital Sign    Worried About Running Out of Food in the Last Year: Never true     Ran Out of Food in the Last Year: Never true  Transportation Needs: No Transportation Needs (03/19/2023)   PRAPARE - Administrator, Civil Service (Medical): No    Lack of Transportation (Non-Medical): No  Recent Concern: Transportation Needs - Unmet Transportation Needs (02/20/2023)   PRAPARE - Transportation    Lack of Transportation (Medical): Yes    Lack of Transportation (Non-Medical): Yes  Physical Activity: Not on file  Stress: No Stress Concern Present (08/24/2019)   Received from [facility], [facility]   [employer] of Occupational Health - Occupational Stress Questionnaire    Feeling of Stress : Not at all  Social Connections: Patient Declined (03/19/2023)   Social Connection and Isolation Panel [NHANES]    Frequency of Communication with Friends and Family: Patient declined    Frequency of Social Gatherings with Friends and Family: Patient declined    Attends Religious Services: Patient declined    Database administrator or Organizations: Patient declined    Attends Banker Meetings: Patient declined    Marital Status: Patient declined  Recent Concern: Social Connections - Moderately Isolated (02/20/2023)   Social Connection and Isolation Panel [NHANES]    Frequency of Communication with Friends and Family: Three times a week    Frequency of Social Gatherings with Friends and Family: Once a week    Attends Religious Services: More than 4 times per year    Active Member of [employer] or Organizations: No    Attends Banker Meetings: Never    Marital Status: Widowed   Family History  Problem Relation Age of Onset   Congestive Heart Failure Mother    Diabetes Mother    Congestive Heart Failure Brother    Scheduled Meds:  atorvastatin  20 mg Oral QHS   calcium acetate  667 mg Oral TID WC   Chlorhexidine Gluconate Cloth  6 each Topical Q0600   [START ON 03/22/2023] furosemide  40 mg Intravenous BID    [START ON 03/24/2023] furosemide  40 mg Intravenous BID   heparin  5,000 Units Subcutaneous Q8H   insulin aspart  0-9 Units Subcutaneous TID WC   midodrine  10 mg Oral TID   pantoprazole  40 mg Oral BID   sodium chloride flush  3 mL Intravenous Q12H   sodium chloride flush  3 mL Intravenous Q12H   Continuous Infusions:  anticoagulant sodium citrate     PRN Meds:.acetaminophen **OR**  acetaminophen, anticoagulant sodium citrate, heparin, ipratropium-albuterol, lidocaine (PF), ondansetron **OR** ondansetron (ZOFRAN) IV, sodium chloride flush Medications Prior to Admission:  Prior to Admission medications   Medication Sig Start Date End Date Taking? Authorizing Provider  acetaminophen (TYLENOL) 325 MG tablet Take 2 tablets (650 mg total) by mouth every 6 (six) hours as needed for mild pain (or Fever >/= 101). 01/09/21  Yes Ghimire, Lyndel Safe, MD  ammonium lactate (LAC-HYDRIN) 12 % lotion Apply 1 Application topically as needed for dry skin.   Yes [provider]  aspirin EC 81 MG tablet Take 81 mg by mouth daily.   Yes [provider]  atorvastatin (LIPITOR) 20 MG tablet Take 1 tablet (20 mg total) by mouth at bedtime. 02/22/23  Yes Delfino Lovett, MD  atropine 1 % ophthalmic solution Place 1 drop into the right eye 2 (two) times daily. 10/19/22  Yes [provider]  cholecalciferol (VITAMIN D) 25 MCG (1000 UNIT) tablet Take 2,000 Units by mouth daily.   Yes [provider]  dextrose (GLUTOSE) 40 % GEL Take 1 Tube by mouth once as needed for low blood sugar.   Yes [provider]  insulin glargine-yfgn (SEMGLEE) 100 UNIT/ML injection Inject 0.08 mLs (8 Units total) into the skin daily. 11/16/22  Yes Darlin Priestly, MD  midodrine (PROAMATINE) 10 MG tablet TAKE ONE TABLET BY MOUTH MONDAY,WEDNESDAY,FRIDAY BEFORE DIALYSIS 02/10/23  Yes [provider]  Multiple Vitamin (MULTIVITAMIN WITH MINERALS) TABS tablet Take 1 tablet by mouth daily. 01/10/21  Yes Dorcas Carrow, MD  omeprazole (PRILOSEC) 40 MG capsule Take 40 mg by mouth 2 (two) times daily before a meal. 01/03/23  Yes [provider]  prednisoLONE acetate (PRED FORTE) 1 % ophthalmic suspension Place 1 drop into the right eye as directed. 10/19/22  Yes [provider]  torsemide (DEMADEX) 20 MG tablet Take 60 mg by mouth as directed. Take 3 tablets (60mg ) by mouth every Sunday, Tuesday, Thursday and Saturday   Yes [provider]  vancomycin (VANCOCIN) 125 MG capsule Take 125 mg by mouth 4 (four) times daily. 03/11/23  Yes [provider]  carvedilol (COREG) 6.25 MG tablet Take 0.5 tablets (3.125 mg total) by mouth 2 (two) times daily. Reduced from 6.25 mg. Patient not taking: Reported on 03/18/2023 11/16/22 11/16/23  Darlin Priestly, MD  insulin aspart (NOVOLOG) 100 UNIT/ML FlexPen Inject 3 Units into the skin 3 (three) times daily with meals. Patient not taking: Reported on 03/18/2023 11/01/22   Lucile Shutters, MD  levothyroxine (SYNTHROID) 75 MCG tablet Take 75 mcg by mouth daily before breakfast. Patient not taking: Reported on 03/18/2023    [provider]  Nutritional Supplements (FEEDING SUPPLEMENT, NEPRO CARB STEADY,) LIQD Take 237 mLs by mouth 3 (three) times daily between meals. 11/16/22   Darlin Priestly, MD  tamsulosin (FLOMAX) 0.4 MG CAPS capsule Take 1 capsule (0.4 mg total) by mouth daily after supper. Patient not taking: Reported on 03/18/2023 09/09/22   Sunnie Nielsen, DO   Allergies  Allergen Reactions   Empagliflozin     Other Reaction(s): Acidosis   Pravastatin     Other reaction(s): Muscle pain   Simvastatin     Other reaction(s): Muscle pain   Review of Systems  All other systems reviewed and are negative.   Physical Exam HENT:     Head: Atraumatic.  Pulmonary:     Effort: Pulmonary effort is normal.  Skin:    General: Skin is warm.  Neurological:     Mental Status:  He is alert.     Vital Signs: BP 123/85 (BP Location: Right  Arm)   Pulse 63   Temp 97.7 F (36.5 C) (Oral)   Resp 18   Ht 5\' 11"  (1.803 m)   Wt 67.2 kg   SpO2 100%   BMI 20.66 kg/m  Pain Scale: 0-10   Pain Score: 0-No pain   SpO2: SpO2: 100 % O2 Device:SpO2: 100 % O2 Flow Rate: .   IO: Intake/output summary: No intake or output data in the 24 hours ending 03/21/23 1149  LBM: Last BM Date : 03/19/23 Baseline Weight: Weight: 70.8 kg Most recent weight: Weight: 67.2 kg       Signed by: Morton Stall, NP   Please contact Palliative Medicine Team phone at [phone] for questions and concerns.  For individual provider: See Loretha Stapler

## 2023-03-21 NOTE — Progress Notes (Signed)
 Heart Failure Navigator Progress Note  Assessed for Heart & Vascular TOC clinic readiness.  Patient does not meet criteria due to ESRD on dialysis.   Navigator will sign off at this time.  Roxy Horseman, RN, BSN [facility] Heart Failure Navigator Secure Chat Only

## 2023-03-21 NOTE — Plan of Care (Deleted)
 Consult noted for GOC. Patient is currently in dialysis. PMT will follow up at a later time.

## 2023-03-21 NOTE — Consult Note (Signed)
 Hospital Consult    Reason for Consult:  Abnormal ABI's Bilaterally and unable to ambulate Requesting Physician:  Dr Gillis Santa MD  MRN #:  [id number]  History of Present Illness: This is a 68 y.o. male with a hx of chronic HFrEF, NICM, HTN, HLD, CVA, DM2, hypothyroidism, ESRD on MWF, former alcohol use, tobacco use who is being seen 03/21/2023 for the evaluation of bilateral lower extremity weakness with marginal ABI's at the request of Dr. Lucianne Muss.   Patient is resting comfortably in hemodialysis this morning.  Patient does endorse generalized weakness to both of his lower extremities.  Patient says that he has not been able to walk like he normally walks in the last 3 months.  Patient endorses bilateral swelling to his feet.  Overall he endorses generalized weakness.  He denies any pain to his bilateral lower extremities.  Vascular surgery consulted to evaluate.  Past Medical History:  Diagnosis Date   Chronic combined systolic (congestive) and diastolic (congestive) heart failure ([name])    a. TTE 6/15: EF < 20%, mildly dilated LV, DD, mildly dilated LA, mod dilated RA, mild MR, mild to mod TR, mod increased posterior wall thickness, elevated LA and LVEDP; b. 4/12019 Echo: EF 20-25%, diff HK. Gr1 DD, nl RV fxn.   CKD (chronic kidney disease), stage II    Diabetes mellitus with complication ([name])    a. Prior admissions w/ DKA (last 12/2017).   Essential hypertension    NICM (nonischemic cardiomyopathy) ([name])    a. 06/2013 Echo: EF<20%; b. 06/2013 Cath: no signif dzs; c. 04/2017 Echo: 20-25%, gr1 DD.; d.05/2018 Echo: 25-30%   Polysubstance abuse ([name])    a. etoh and tobacco   Stroke [facility])     Past Surgical History:  Procedure Laterality Date   CENTRAL LINE INSERTION N/A 06/29/2021   Procedure: CENTRAL LINE INSERTION;  Surgeon: Yvonne Kendall, MD;  Location: [facility] INVASIVE CV LAB;  Service: Cardiovascular;  Laterality: N/A;   IR PARACENTESIS  09/09/2022   NO PAST SURGERIES     RIGHT HEART  CATH N/A 06/29/2021   Procedure: RIGHT HEART CATH;  Surgeon: Yvonne Kendall, MD;  Location: [facility] INVASIVE CV LAB;  Service: Cardiovascular;  Laterality: N/A;    Allergies  Allergen Reactions   Empagliflozin     Other Reaction(s): Acidosis   Pravastatin     Other reaction(s): Muscle pain   Simvastatin     Other reaction(s): Muscle pain    Prior to Admission medications   Medication Sig Start Date End Date Taking? Authorizing Provider  acetaminophen (TYLENOL) 325 MG tablet Take 2 tablets (650 mg total) by mouth every 6 (six) hours as needed for mild pain (or Fever >/= 101). 01/09/21  Yes Ghimire, Lyndel Safe, MD  ammonium lactate (LAC-HYDRIN) 12 % lotion Apply 1 Application topically as needed for dry skin.   Yes [provider]  aspirin EC 81 MG tablet Take 81 mg by mouth daily.   Yes [provider]  atorvastatin (LIPITOR) 20 MG tablet Take 1 tablet (20 mg total) by mouth at bedtime. 02/22/23  Yes Delfino Lovett, MD  atropine 1 % ophthalmic solution Place 1 drop into the right eye 2 (two) times daily. 10/19/22  Yes [provider]  cholecalciferol (VITAMIN D) 25 MCG (1000 UNIT) tablet Take 2,000 Units by mouth daily.   Yes [provider]  dextrose (GLUTOSE) 40 % GEL Take 1 Tube by mouth once as needed for low blood sugar.   Yes [provider]  insulin  glargine-yfgn (SEMGLEE) 100 UNIT/ML injection Inject 0.08 mLs (8 Units total) into the skin daily. 11/16/22  Yes Darlin Priestly, MD  midodrine (PROAMATINE) 10 MG tablet TAKE ONE TABLET BY MOUTH MONDAY,WEDNESDAY,FRIDAY BEFORE DIALYSIS 02/10/23  Yes [provider]  Multiple Vitamin (MULTIVITAMIN WITH MINERALS) TABS tablet Take 1 tablet by mouth daily. 01/10/21  Yes Dorcas Carrow, MD  omeprazole (PRILOSEC) 40 MG capsule Take 40 mg by mouth 2 (two) times daily before a meal. 01/03/23  Yes [provider]  prednisoLONE acetate (PRED FORTE) 1 % ophthalmic suspension Place 1 drop into the right eye as  directed. 10/19/22  Yes [provider]  torsemide (DEMADEX) 20 MG tablet Take 60 mg by mouth as directed. Take 3 tablets (60mg ) by mouth every Sunday, Tuesday, Thursday and Saturday   Yes [provider]  vancomycin (VANCOCIN) 125 MG capsule Take 125 mg by mouth 4 (four) times daily. 03/11/23  Yes [provider]  carvedilol (COREG) 6.25 MG tablet Take 0.5 tablets (3.125 mg total) by mouth 2 (two) times daily. Reduced from 6.25 mg. Patient not taking: Reported on 03/18/2023 11/16/22 11/16/23  Darlin Priestly, MD  insulin aspart (NOVOLOG) 100 UNIT/ML FlexPen Inject 3 Units into the skin 3 (three) times daily with meals. Patient not taking: Reported on 03/18/2023 11/01/22   Lucile Shutters, MD  levothyroxine (SYNTHROID) 75 MCG tablet Take 75 mcg by mouth daily before breakfast. Patient not taking: Reported on 03/18/2023    [provider]  Nutritional Supplements (FEEDING SUPPLEMENT, NEPRO CARB STEADY,) LIQD Take 237 mLs by mouth 3 (three) times daily between meals. 11/16/22   Darlin Priestly, MD  tamsulosin (FLOMAX) 0.4 MG CAPS capsule Take 1 capsule (0.4 mg total) by mouth daily after supper. Patient not taking: Reported on 03/18/2023 09/09/22   Sunnie Nielsen, DO    Social History   Socioeconomic History   Marital status: Widowed    Spouse name: Not on file   Number of children: Not on file   Years of education: Not on file   Highest education level: Not on file  Occupational History   Not on file  Tobacco Use   Smoking status: Every Day    Current packs/day: 0.50    Types: Cigarettes   Smokeless tobacco: Never  Vaping Use   Vaping status: Never Used  Substance and Sexual Activity   Alcohol use: Not Currently   Drug use: Never   Sexual activity: Not Currently  Other Topics Concern   Not on file  Social History Narrative   Not on file   Social Drivers of Health   Financial Resource Strain: High Risk (08/24/2019)   Received from [facility], [facility]   Overall Financial Resource Strain (CARDIA)    Difficulty of Paying Living Expenses: Very hard  Food Insecurity: No Food Insecurity (03/19/2023)   Hunger Vital Sign    Worried About Running Out of Food in the Last Year: Never true    Ran Out of Food in the Last Year: Never true  Transportation Needs: No Transportation Needs (03/19/2023)   PRAPARE - Administrator, Civil Service (Medical): No    Lack of Transportation (Non-Medical): No  Recent Concern: Transportation Needs - Unmet Transportation Needs (02/20/2023)   PRAPARE - Transportation    Lack of Transportation (Medical): Yes    Lack of Transportation (Non-Medical): Yes  Physical Activity: Not on file  Stress: No Stress Concern Present (08/24/2019)   Received from [facility], [facility]   [employer] of Occupational Health - Occupational Stress Questionnaire    Feeling of Stress : Not at all  Social Connections: Patient Declined (03/19/2023)   Social Connection and Isolation Panel [NHANES]    Frequency of Communication with Friends and Family: Patient declined    Frequency of Social Gatherings with Friends and Family: Patient declined    Attends Religious Services: Patient declined    Database administrator or Organizations: Patient declined    Attends Banker Meetings: Patient declined    Marital Status: Patient declined  Recent Concern: Social Connections - Moderately Isolated (02/20/2023)   Social Connection and Isolation Panel [NHANES]    Frequency of Communication with Friends and Family: Three times a week    Frequency of Social Gatherings with Friends and Family: Once a week    Attends Religious Services: More than 4 times per year    Active Member of [employer] or Organizations: No    Attends Banker Meetings: Never    Marital Status: Widowed  Intimate Partner Violence: Not At Risk (03/19/2023)   Humiliation,  Afraid, Rape, and Kick questionnaire    Fear of Current or Ex-Partner: No    Emotionally Abused: No    Physically Abused: No    Sexually Abused: No     Family History  Problem Relation Age of Onset   Congestive Heart Failure Mother    Diabetes Mother    Congestive Heart Failure Brother     ROS: Otherwise negative unless mentioned in HPI  Physical Examination  Vitals:   03/21/23 0900 03/21/23 0930  BP: 117/84 121/86  Pulse: 69 69  Resp: 19 13  Temp:    SpO2: 99% 95%   Body mass index is 20.66 kg/m.  General:  WDWN in NAD Gait: Not observed HENT: WNL, normocephalic Pulmonary: normal non-labored breathing, without Rales, rhonchi,  wheezing Cardiac: regular, without  Murmurs, rubs or gallops; without carotid bruits Abdomen: Positive bowel sounds throughout, soft, NT/ND, no masses Skin: without rashes Vascular Exam/Pulses: Unable to palpate bilateral lower DP and PT due to +2/+3 edema.  Positive bilateral palpable popliteal pulses and femoral pulses. Extremities: without ischemic changes, without Gangrene , without cellulitis; without open wounds;  Musculoskeletal: no muscle wasting or atrophy  Neurologic: A&O X 3;  No focal weakness or paresthesias are detected; speech is fluent/normal Psychiatric:  The pt has Normal affect. Lymph:  Unremarkable  CBC    Component Value Date/Time   WBC 4.3 03/21/2023 0544   RBC 3.83 (L) 03/21/2023 0544   HGB 11.5 (L) 03/21/2023 0544   HGB 15.7 06/18/2013 0427   HCT 34.8 (L) 03/21/2023 0544   HCT 46.5 06/18/2013 0427   PLT 162 03/21/2023 0544   PLT 345 06/18/2013 0427   MCV 90.9 03/21/2023 0544   MCV 99 06/18/2013 0427   MCH 30.0 03/21/2023 0544   MCHC 33.0 03/21/2023 0544   RDW 15.9 (H) 03/21/2023 0544   RDW 12.9 06/18/2013 0427   LYMPHSABS 0.5 (L) 03/18/2023 1515   LYMPHSABS 1.5 06/18/2013 0427   MONOABS 0.3 03/18/2023 1515   MONOABS 0.9 06/18/2013 0427   EOSABS 0.0 03/18/2023 1515   EOSABS 0.1 06/18/2013 0427   BASOSABS  0.0 03/18/2023 1515   BASOSABS 0.1 06/18/2013 0427    BMET    Component Value Date/Time   NA 133 (L) 03/21/2023 0544   NA 136 07/14/2017 1053   NA 135 (L) 06/20/2013 0405   K 5.7 (H)  03/21/2023 0544   K 4.3 06/20/2013 0405   CL 99 03/21/2023 0544   CL 99 06/20/2013 0405   CO2 21 (L) 03/21/2023 0544   CO2 30 06/20/2013 0405   GLUCOSE 111 (H) 03/21/2023 0544   GLUCOSE 145 (H) 06/20/2013 0405   BUN 59 (H) 03/21/2023 0544   BUN 8 07/14/2017 1053   BUN 15 06/20/2013 0405   CREATININE 4.03 (H) 03/21/2023 0544   CREATININE 0.86 06/20/2013 0405   CALCIUM 8.5 (L) 03/21/2023 0544   CALCIUM 9.0 06/20/2013 0405   GFRNONAA 16 (L) 03/21/2023 0544   GFRNONAA >60 06/20/2013 0405   GFRAA >60 06/20/2019 1214   GFRAA >60 06/20/2013 0405    COAGS: Lab Results  Component Value Date   INR 1.2 02/19/2023   INR 1.8 (H) 08/28/2022   INR 1.0 01/21/2021     Non-Invasive Vascular Imaging:   EXAM:03/20/23 NONINVASIVE PHYSIOLOGIC VASCULAR STUDY OF BILATERAL LOWER EXTREMITIES   TECHNIQUE: Evaluation of both lower extremities were performed at rest, including calculation of ankle-brachial indices with single level Doppler, pressure and pulse volume recording.   COMPARISON:  None Available.   FINDINGS: Right ABI:  0.7   Left ABI:  0.7   Right Lower Extremity:  Abnormal monophasic arterial waveforms.   Left Lower Extremity:  Abnormal monophasic arterial waveforms.   0.5-0.79 Moderate PAD   IMPRESSION: Abnormal resting bilateral ankle-brachial indices of 0.7 consistent with at least moderate underlying peripheral arterial disease.    Statin:  Yes.   Beta Blocker:  No. Aspirin:  Yes.   ACEI:  No. ARB:  No. CCB use:  No Other antiplatelets/anticoagulants:  No.  Heparin with dialysis.   ASSESSMENT/PLAN: This is a 68 y.o. male who presents to [facility] emergency department with multiorgan failure complicated by nausea and vomiting and diarrhea for 24 hours.  On exam patient is noted  to be emaciated and very underweight.  Also noted to have bilateral lower extremity +2 to +3 edema in his feet.  He underwent bilateral lower extremity ultrasounds with ABIs.  They were marginal at 0.7 with monophasic pulses.  However he is not complaining of lower extremity pain upon ambulation or rest, bilateral lower extremities are warm to touch and he does have palpable popliteal pulses.  Vascular surgery does not recommend a procedure at this time to evaluate his bilateral lower extremities.  His recent ultrasound testing is marginal and with not having any consistent pain or ischemia we can see the patient as outpatient for further evaluation once being discharged from the hospital.   -I discussed the case in detail with Dr. Festus Barren MD and he agrees with the plan.   Marcie Bal [facility] 03/21/2023 9:55 AM

## 2023-03-21 NOTE — Inpatient Diabetes Management (Signed)
 Inpatient Diabetes Program Recommendations  AACE/ADA: New Consensus Statement on Inpatient Glycemic Control   Target Ranges:  Prepandial:   less than 140 mg/dL      Peak postprandial:   less than 180 mg/dL (1-2 hours)      Critically ill patients:  140 - 180 mg/dL    Latest Reference Range & Units 03/20/23 08:15 03/20/23 12:30 03/20/23 15:56 03/20/23 21:17 03/21/23 07:51 03/21/23 12:38  Glucose-Capillary 70 - 99 mg/dL 962 (H) 952 (H) 841 (H) 93 116 (H) 87    Latest Reference Range & Units 03/18/23 15:15  Hemoglobin A1C 4.8 - 5.6 % 9.9 (H)   Review of Glycemic Control  Diabetes history: DM2 Outpatient Diabetes medications: Semglee 3-8 units daily, Novolog 3 units with breakfast and sometimes takes with supper as well Current orders for Inpatient glycemic control: Novolog 0-9 units TID with meals  Inpatient Diabetes Program Recommendations:    HbgA1C:  A1C 9.9% on 03/18/23 indicating an average glucose of 237 mg/dl over the past 2-3 months.  NOTE: Spoke with patient at bedside about diabetes and home regimen for diabetes control. Patient reports he goes to [place] for DM management and currently taking Semglee 3-8 units QAM and Novolog 3 units with breakfast and sometimes takes with supper as well as an outpatient for diabetes control. Patient reports he eats a big breakfast and eats again in the evening but usually a smaller meal. Patient reports checking glucose 2-3 times per day and that it is usually in the 100's mg/dl. Patient notes that he has hypoglycemia but not very often. Patient states his sister lives with him and she helps him sometimes with DM management.  Discussed A1C results (9.9% on 03/18/23) and explained that current A1C indicates an average glucose of 237 mg/dl over the past 2-3 months. Discussed glucose and A1C goals. Discussed importance of checking CBGs and maintaining good CBG control to prevent long-term and short-term complications. Encouraged patient to continue to work  with providers at [facility] regarding improving DM control. Patient notes he has all needed DM medications and supplies at home. Patient verbalized understanding of information discussed and reports no further questions at this time related to diabetes.   Thanks, Orlando Penner, RN, MSN, CDE Diabetes Coordinator Inpatient Diabetes Program [phone] (Team Pager)

## 2023-03-21 NOTE — Consult Note (Signed)
 Cardiology Consultation   Patient ID: Shawn [name] MRN: [id number]; DOB: 14-Oct-1955  Admit date: 03/18/2023 Date of Consult: 03/21/2023  PCP:  [facility]   [facility] Providers Cardiologist:  Julien Nordmann, MD   {  Patient Profile:   Shawn [name] is a 68 y.o. male with a hx of chronic HFrEF, NICM, HTN, HLD, CVA, DM2, hypothyroidism, ESRD on MWF, former alcohol use, tobacco use who is being seen 03/21/2023 for the evaluation of volume overload at the request of Dr. Lucianne Muss.  History of Present Illness:   Shawn [name] has a h/o NIAM with diagnostic cath at an outside setting in 2015 with no Cad. Echo at that time showed LVEF<20%. He established care with Dr. Mariah Milling in April 2019 during hospital admission. Echo showed LVEF 20-25%. Repeat echo May 2020 showed EF 25-30%. He was lost to follow-up after June 2021. Echo at that time showed LVEF 40-45%, global HK, severe LVH, G1DD, normal RV size/function, speckled appearing myocardium with severe LVH, suggestive of cardiac amyloidosis. Upon review of echo it was felt findings it was felt findings were inconsistent with cardiac amyloidosis and outpatient PYP scan was recommended. He was started on GDMT. Patient was not seen for OP follow-up. He was hospitalized June 2023 for volume overload and underwent diuresis. Echo showed LVEF 25-30%. RHC showed elevated filling pressures. Again, he was not seen for outpatient follow-up.   He presented to the ER 02/19/23 with N/V. He missed HM the day before and admitted. GI felt presentation was consistent with Mallory-Weiss tear and ongoing GI bleeding. Hgb remained stable throughout admission. He was seen in the office 03/01/23 and was overall doin well. He takes torsemide on non dialysis. He takes midodrine on HD days. He was referred to [facility] team.  The patient presented to the ER 03/18/23 with weakness, nasuea, vomiting and diarrhea. He was unable to take his medications  and did not go to dialysis. Last HD 3/10. Reports intermittent chest pain.  In the ER temp 94 F, improved after warm blankets, HR 67, RR 18, BP 141/85, 98% on RA. Scr 5.04, BUN 81, BG 172, albumin 2.5, AST 67, WBC 3.3, Hgb 13.6, plt 54. CTA chest showed large b/l pleural effusion, lower lobe compressive atelectasis, large volume ascites. CXR bibasilar consolidation and b/l effusions, L>R. Started on IV lasix and admitted.    Past Medical History:  Diagnosis Date   Chronic combined systolic (congestive) and diastolic (congestive) heart failure ([name])    a. TTE 6/15: EF < 20%, mildly dilated LV, DD, mildly dilated LA, mod dilated RA, mild MR, mild to mod TR, mod increased posterior wall thickness, elevated LA and LVEDP; b. 4/12019 Echo: EF 20-25%, diff HK. Gr1 DD, nl RV fxn.   CKD (chronic kidney disease), stage II    Diabetes mellitus with complication ([name])    a. Prior admissions w/ DKA (last 12/2017).   Essential hypertension    NICM (nonischemic cardiomyopathy) ([name])    a. 06/2013 Echo: EF<20%; b. 06/2013 Cath: no signif dzs; c. 04/2017 Echo: 20-25%, gr1 DD.; d.05/2018 Echo: 25-30%   Polysubstance abuse ([name])    a. etoh and tobacco   Stroke [facility])     Past Surgical History:  Procedure Laterality Date   CENTRAL LINE INSERTION N/A 06/29/2021   Procedure: CENTRAL LINE INSERTION;  Surgeon: Yvonne Kendall, MD;  Location: [facility] INVASIVE CV LAB;  Service: Cardiovascular;  Laterality: N/A;   IR PARACENTESIS  09/09/2022   NO PAST SURGERIES  RIGHT HEART CATH N/A 06/29/2021   Procedure: RIGHT HEART CATH;  Surgeon: Yvonne Kendall, MD;  Location: [facility] INVASIVE CV LAB;  Service: Cardiovascular;  Laterality: N/A;     Home Medications:  Prior to Admission medications   Medication Sig Start Date End Date Taking? Authorizing Provider  acetaminophen (TYLENOL) 325 MG tablet Take 2 tablets (650 mg total) by mouth every 6 (six) hours as needed for mild pain (or Fever >/= 101). 01/09/21  Yes Ghimire, Lyndel Safe, MD   ammonium lactate (LAC-HYDRIN) 12 % lotion Apply 1 Application topically as needed for dry skin.   Yes [provider]  aspirin EC 81 MG tablet Take 81 mg by mouth daily.   Yes [provider]  atorvastatin (LIPITOR) 20 MG tablet Take 1 tablet (20 mg total) by mouth at bedtime. 02/22/23  Yes Delfino Lovett, MD  atropine 1 % ophthalmic solution Place 1 drop into the right eye 2 (two) times daily. 10/19/22  Yes [provider]  cholecalciferol (VITAMIN D) 25 MCG (1000 UNIT) tablet Take 2,000 Units by mouth daily.   Yes [provider]  dextrose (GLUTOSE) 40 % GEL Take 1 Tube by mouth once as needed for low blood sugar.   Yes [provider]  insulin glargine-yfgn (SEMGLEE) 100 UNIT/ML injection Inject 0.08 mLs (8 Units total) into the skin daily. 11/16/22  Yes Darlin Priestly, MD  midodrine (PROAMATINE) 10 MG tablet TAKE ONE TABLET BY MOUTH MONDAY,WEDNESDAY,FRIDAY BEFORE DIALYSIS 02/10/23  Yes [provider]  Multiple Vitamin (MULTIVITAMIN WITH MINERALS) TABS tablet Take 1 tablet by mouth daily. 01/10/21  Yes Dorcas Carrow, MD  omeprazole (PRILOSEC) 40 MG capsule Take 40 mg by mouth 2 (two) times daily before a meal. 01/03/23  Yes [provider]  prednisoLONE acetate (PRED FORTE) 1 % ophthalmic suspension Place 1 drop into the right eye as directed. 10/19/22  Yes [provider]  torsemide (DEMADEX) 20 MG tablet Take 60 mg by mouth as directed. Take 3 tablets (60mg ) by mouth every Sunday, Tuesday, Thursday and Saturday   Yes [provider]  vancomycin (VANCOCIN) 125 MG capsule Take 125 mg by mouth 4 (four) times daily. 03/11/23  Yes [provider]  carvedilol (COREG) 6.25 MG tablet Take 0.5 tablets (3.125 mg total) by mouth 2 (two) times daily. Reduced from 6.25 mg. Patient not taking: Reported on 03/18/2023 11/16/22 11/16/23  Darlin Priestly, MD  insulin aspart (NOVOLOG) 100 UNIT/ML FlexPen Inject 3 Units into the skin 3 (three)  times daily with meals. Patient not taking: Reported on 03/18/2023 11/01/22   Lucile Shutters, MD  levothyroxine (SYNTHROID) 75 MCG tablet Take 75 mcg by mouth daily before breakfast. Patient not taking: Reported on 03/18/2023    [provider]  Nutritional Supplements (FEEDING SUPPLEMENT, NEPRO CARB STEADY,) LIQD Take 237 mLs by mouth 3 (three) times daily between meals. 11/16/22   Darlin Priestly, MD  tamsulosin (FLOMAX) 0.4 MG CAPS capsule Take 1 capsule (0.4 mg total) by mouth daily after supper. Patient not taking: Reported on 03/18/2023 09/09/22   Sunnie Nielsen, DO    Inpatient Medications: Scheduled Meds:  atorvastatin  20 mg Oral QHS   calcium acetate  667 mg Oral TID WC   Chlorhexidine Gluconate Cloth  6 each Topical Q0600   [START ON 03/22/2023] furosemide  40 mg Intravenous BID   [START ON 03/24/2023] furosemide  40 mg Intravenous BID   heparin  5,000 Units Subcutaneous Q8H   insulin aspart  0-9 Units Subcutaneous TID WC  midodrine  10 mg Oral TID   pantoprazole  40 mg Oral BID   sodium chloride flush  3 mL Intravenous Q12H   sodium chloride flush  3 mL Intravenous Q12H   Continuous Infusions:  anticoagulant sodium citrate     PRN Meds: acetaminophen **OR** acetaminophen, anticoagulant sodium citrate, heparin, ipratropium-albuterol, lidocaine (PF), ondansetron **OR** ondansetron (ZOFRAN) IV, sodium chloride flush  Allergies:    Allergies  Allergen Reactions   Empagliflozin     Other Reaction(s): Acidosis   Pravastatin     Other reaction(s): Muscle pain   Simvastatin     Other reaction(s): Muscle pain    Social History:   Social History   Socioeconomic History   Marital status: Widowed    Spouse name: Not on file   Number of children: Not on file   Years of education: Not on file   Highest education level: Not on file  Occupational History   Not on file  Tobacco Use   Smoking status: Every Day    Current packs/day: 0.50    Types: Cigarettes    Smokeless tobacco: Never  Vaping Use   Vaping status: Never Used  Substance and Sexual Activity   Alcohol use: Not Currently   Drug use: Never   Sexual activity: Not Currently  Other Topics Concern   Not on file  Social History Narrative   Not on file   Social Drivers of Health   Financial Resource Strain: High Risk (08/24/2019)   Received from [facility], [facility]   Overall Financial Resource Strain (CARDIA)    Difficulty of Paying Living Expenses: Very hard  Food Insecurity: No Food Insecurity (03/19/2023)   Hunger Vital Sign    Worried About Running Out of Food in the Last Year: Never true    Ran Out of Food in the Last Year: Never true  Transportation Needs: No Transportation Needs (03/19/2023)   PRAPARE - Administrator, Civil Service (Medical): No    Lack of Transportation (Non-Medical): No  Recent Concern: Transportation Needs - Unmet Transportation Needs (02/20/2023)   PRAPARE - Transportation    Lack of Transportation (Medical): Yes    Lack of Transportation (Non-Medical): Yes  Physical Activity: Not on file  Stress: No Stress Concern Present (08/24/2019)   Received from [facility], [facility]   [employer] of Occupational Health - Occupational Stress Questionnaire    Feeling of Stress : Not at all  Social Connections: Patient Declined (03/19/2023)   Social Connection and Isolation Panel [NHANES]    Frequency of Communication with Friends and Family: Patient declined    Frequency of Social Gatherings with Friends and Family: Patient declined    Attends Religious Services: Patient declined    Database administrator or Organizations: Patient declined    Attends Banker Meetings: Patient declined    Marital Status: Patient declined  Recent Concern: Social Connections - Moderately Isolated (02/20/2023)   Social Connection and Isolation Panel [NHANES]    Frequency of  Communication with Friends and Family: Three times a week    Frequency of Social Gatherings with Friends and Family: Once a week    Attends Religious Services: More than 4 times per year    Active Member of [employer] or Organizations: No    Attends Banker Meetings: Never    Marital Status: Widowed  Intimate Partner Violence: Not At Risk (03/19/2023)   Humiliation, Afraid, Rape, and  Kick questionnaire    Fear of Current or Ex-Partner: No    Emotionally Abused: No    Physically Abused: No    Sexually Abused: No    Family History:    Family History  Problem Relation Age of Onset   Congestive Heart Failure Mother    Diabetes Mother    Congestive Heart Failure Brother      ROS:  Please see the history of present illness.   All other ROS reviewed and negative.     Physical Exam/Data:   Vitals:   03/21/23 0900 03/21/23 0930 03/21/23 1000 03/21/23 1030  BP: 117/84 121/86 (!) 123/93 122/86  Pulse: 69 69 69 67  Resp: 19 13 19 20   Temp:      TempSrc:      SpO2: 99% 95% 100% 98%  Weight:      Height:       No intake or output data in the 24 hours ending 03/21/23 1050    03/21/2023    8:10 AM 03/20/2023    5:02 AM 03/20/2023   12:21 AM  Last 3 Weights  Weight (lbs) 148 lb 2.4 oz 143 lb 15.4 oz 149 lb 0.5 oz  Weight (kg) 67.2 kg 65.3 kg 67.6 kg     Body mass index is 20.66 kg/m.  General:  chronically ill-appearing, in no acute distress HEENT: normal Neck: + JVD Vascular: No carotid bruits; Distal pulses 2+ bilaterally Cardiac:  normal S1, S2; RRR; no murmur  Lungs:  blunted at bases Abd: soft, nontender, no hepatomegaly  Ext: 2+ lower leg edema Musculoskeletal:  No deformities, BUE and BLE strength normal and equal Skin: warm and dry  Neuro:  CNs 2-12 intact, no focal abnormalities noted Psych:  Normal affect   EKG:  The EKG was personally reviewed and demonstrates:  NSR 64bpm, LAD, TWI lat leads Telemetry:  Telemetry was personally reviewed and  demonstrates:  Nsr HR 60s, PVCs, Vent. bigeminy  Relevant CV Studies:  Echo 03/2023 1. Left ventricular ejection fraction, by estimation, is 20 to 25%. The  left ventricle has severely decreased function. The left ventricle  demonstrates regional wall motion abnormalities (see scoring  diagram/findings for description). There is mild  concentric left ventricular hypertrophy. The average left ventricular  global longitudinal strain is -5.7 %. The global longitudinal strain is  abnormal.   2. Right ventricular systolic function is normal. The right ventricular  size is normal. There is normal pulmonary artery systolic pressure.   3. Left atrial size was severely dilated.   4. A small pericardial effusion is present. The pericardial effusion is  localized near the right atrium. Large pleural effusion in the left  lateral region.   5. The mitral valve is normal in structure. Mild mitral valve  regurgitation. No evidence of mitral stenosis.   6. The aortic valve is tricuspid. Aortic valve regurgitation is not  visualized. No aortic stenosis is present.   7. The inferior vena cava is normal in size with greater than 50%  respiratory variability, suggesting right atrial pressure of 3 mmHg.   RHC 2023 Conclusions: Moderately elevated left heart filling pressure (PCWP 28 mmHg). Moderately to severely elevated right heart and pulmonary artery pressures (mean RA 13 mmHg, RVEDP 15 mmHg, mean PAP 41 mmHg). Normal Fick cardiac output (Fick CO 4.5 L/min, CI 2.7 L/min/m^2) with low central venous oxygen saturation (SvO2 51%). Successful placement of 24F triple lumen catheter via the right internal jugular vein.   Recommendations: Transfer to stepdown  for initiation of milrinone. Continue furosemide infusion; dose escalation will likely be needed based on response to milrinone. Monitor CVP every 4 hours and daily central venous oxygen saturations.   Yvonne Kendall, MD [facility]   Echo  06/2021 1. Left ventricular ejection fraction, by estimation, is 25 to 30%. The  left ventricle has severely decreased function. The left ventricle  demonstrates global hypokinesis. The left ventricular internal cavity size  was mildly dilated. Left ventricular  diastolic parameters are consistent with Grade II diastolic dysfunction  (pseudonormalization). The average left ventricular global longitudinal  strain is 9.0 %. The global longitudinal strain is abnormal.   2. Right ventricular systolic function is normal. The right ventricular  size is normal. Tricuspid regurgitation signal is inadequate for assessing  PA pressure.   3. Left atrial size was mildly dilated.   4. A small pericardial effusion is present. The pericardial effusion is  circumferential. There is no evidence of cardiac tamponade. Moderate  pleural effusion in the left lateral region.   5. The mitral valve is normal in structure. Moderate mitral valve  regurgitation. No evidence of mitral stenosis.   6. The aortic valve is tricuspid. Aortic valve regurgitation is not  visualized. No aortic stenosis is present.   7. The inferior vena cava is dilated in size with >50% respiratory  variability, suggesting right atrial pressure of 8 mmHg.    Echo 01/2021  1. Left ventricular ejection fraction, by estimation, is 40 to 45%. The  left ventricle has mildly decreased function. The left ventricle  demonstrates global hypokinesis. There is severe left ventricular  hypertrophy. Left ventricular diastolic parameters   are consistent with Grade I diastolic dysfunction (impaired relaxation)  Shiny speckled appearing myocardium with severe LVH, mild valve  thickening, small pericardial effusion. Findings could suggest cardiac  amyolidosis.   2. Right ventricular systolic function is normal. The right ventricular  size is normal. Tricuspid regurgitation signal is inadequate for assessing  PA pressure.   3. A small pericardial  effusion is present. The pericardial effusion is  circumferential.   4. The mitral valve is normal in structure. No evidence of mitral valve  regurgitation. No evidence of mitral stenosis.   5. The aortic valve is tricuspid. There is mild calcification of the  aortic valve. There is mild thickening of the aortic valve. Aortic valve  regurgitation is not visualized. No aortic stenosis is present.   6. The inferior vena cava is normal in size with greater than 50%  respiratory variability, suggesting right atrial pressure of 3 mmHg.    Laboratory Data:  High Sensitivity Troponin:  No results for input(s): "TROPONINIHS" in the last 720 hours.   Chemistry Recent Labs  Lab 03/19/23 0821 03/20/23 0443 03/21/23 0544  NA 132* 132* 133*  K 5.2* 3.2* 5.7*  CL 100 95* 99  CO2 22 24 21*  GLUCOSE 166* 134* 111*  BUN 83* 32* 59*  CREATININE 5.29* 2.14* 4.03*  CALCIUM 8.1* 8.5* 8.5*  MG 1.9 1.9 1.9  GFRNONAA 11* 33* 16*  ANIONGAP 10 13 13     Recent Labs  Lab 03/18/23 0949  PROT 6.4*  ALBUMIN 2.5*  AST 67*  ALT 37  ALKPHOS 118  BILITOT 0.7   Lipids No results for input(s): "CHOL", "TRIG", "HDL", "LABVLDL", "LDLCALC", "CHOLHDL" in the last 168 hours.  Hematology Recent Labs  Lab 03/19/23 0821 03/20/23 0443 03/21/23 0544  WBC 5.7 4.4 4.3  RBC 4.66 4.29 3.83*  HGB 14.1 13.0 11.5*  HCT  43.5 38.8* 34.8*  MCV 93.3 90.4 90.9  MCH 30.3 30.3 30.0  MCHC 32.4 33.5 33.0  RDW 15.7* 15.6* 15.9*  PLT 156 150 162   Thyroid No results for input(s): "TSH", "FREET4" in the last 168 hours.  BNPNo results for input(s): "BNP", "PROBNP" in the last 168 hours.  DDimer No results for input(s): "DDIMER" in the last 168 hours.   Radiology/Studies:  US ARTERIAL ABI (SCREENING LOWER EXTREMITY) Result Date: 03/20/2023 CLINICAL DATA:  Lower extremity pain EXAM: NONINVASIVE PHYSIOLOGIC VASCULAR STUDY OF BILATERAL LOWER EXTREMITIES TECHNIQUE: Evaluation of both lower extremities were performed at  rest, including calculation of ankle-brachial indices with single level Doppler, pressure and pulse volume recording. COMPARISON:  None Available. FINDINGS: Right ABI:  0.7 Left ABI:  0.7 Right Lower Extremity:  Abnormal monophasic arterial waveforms. Left Lower Extremity:  Abnormal monophasic arterial waveforms. 0.5-0.79 Moderate PAD IMPRESSION: Abnormal resting bilateral ankle-brachial indices of 0.7 consistent with at least moderate underlying peripheral arterial disease. Electronically Signed   By: Malachy Moan M.D.   On: 03/20/2023 15:19   DG Chest Port 1 View Result Date: 03/19/2023 CLINICAL DATA:  142230 Pleural effusion 142230 [id number] S/P thoracentesis [id number]. EXAM: PORTABLE CHEST 1 VIEW COMPARISON:  03/18/2023. FINDINGS: There is significant interval decrease in the left pleural effusion, status post thoracentesis. No pneumothorax. There are residual atelectatic changes at the left lung base. Bilateral lung fields are otherwise clear. Right lateral costophrenic angle is clear. Stable cardio-mediastinal silhouette. No acute osseous abnormalities. The soft tissues are within normal limits. Right IJ hemodialysis catheter noted with its tip overlying the cavoatrial junction region. IMPRESSION: Significant interval decrease in the left pleural effusion, status post thoracentesis. No pneumothorax. Electronically Signed   By: Jules Schick M.D.   On: 03/19/2023 14:59   US THORACENTESIS ASP PLEURAL SPACE W/IMG GUIDE Result Date: 03/19/2023 INDICATION: 57 you male with bilateral pleural effusions on recent imaging. IR was requested for diagnostic and therapeutic thoracentesis. EXAM: ULTRASOUND GUIDED DIAGNOSTIC AND THERAPEUTIC LEFT-SIDED THORACENTESIS MEDICATIONS: 6 cc OF 1% lidocaine COMPLICATIONS: None immediate. PROCEDURE: An ultrasound guided thoracentesis was thoroughly discussed with the patient and questions answered. The benefits, risks, alternatives and complications were also discussed. The  patient understands and wishes to proceed with the procedure. Written consent was obtained. Ultrasound was performed to localize and mark an adequate pocket of fluid in the left chest. The area was then prepped and draped in the normal sterile fashion. 1% Lidocaine was used for local anesthesia. Under ultrasound guidance a 6 Fr Safe-T-Centesis catheter was introduced. Thoracentesis was performed. The catheter was removed and a dressing applied. FINDINGS: A total of approximately 550 cc of clear, straw-colored pleural fluid was removed. Samples were sent to the laboratory as requested by the clinical team. Right thorax not amenable to thoracentesis at this time due to low volume on ultrasound. IMPRESSION: Successful ultrasound guided left thoracentesis yielding 500 cc of pleural fluid. Preformed by Buzzy Han, PA-C. Electronically Signed   By: Irish Lack M.D.   On: 03/19/2023 14:44   ECHOCARDIOGRAM COMPLETE Result Date: 03/19/2023    ECHOCARDIOGRAM REPORT   Patient Name:   Shawn [name] Date of Exam: 03/19/2023 Medical Rec #:  [id number]           Height:       71.0 in Accession #:    [id number]          Weight:       156.0 lb Date of Birth:  Feb 12, 1955  BSA:          1.897 m Patient Age:    67 years            BP:           121/82 mmHg Patient Gender: M                   HR:           57 bpm. Exam Location:  [facility] Procedure: 2D Echo, Cardiac Doppler, Color Doppler and Strain Analysis (Both            Spectral and Color Flow Doppler were utilized during procedure). Indications:     CHF-Acute Systolic I50.21  History:         Patient has prior history of Echocardiogram examinations.                  Stroke; Risk Factors:Hypertension.  Sonographer:     Neysa Bonito Roar Referring Phys:  [id number] Gillis Santa Diagnosing Phys: Chilton Si MD  Sonographer Comments: Global longitudinal strain was attempted. IMPRESSIONS  1. Left ventricular ejection fraction, by estimation, is 20 to 25%. The left  ventricle has severely decreased function. The left ventricle demonstrates regional wall motion abnormalities (see scoring diagram/findings for description). There is mild concentric left ventricular hypertrophy. The average left ventricular global longitudinal strain is -5.7 %. The global longitudinal strain is abnormal.  2. Right ventricular systolic function is normal. The right ventricular size is normal. There is normal pulmonary artery systolic pressure.  3. Left atrial size was severely dilated.  4. A small pericardial effusion is present. The pericardial effusion is localized near the right atrium. Large pleural effusion in the left lateral region.  5. The mitral valve is normal in structure. Mild mitral valve regurgitation. No evidence of mitral stenosis.  6. The aortic valve is tricuspid. Aortic valve regurgitation is not visualized. No aortic stenosis is present.  7. The inferior vena cava is normal in size with greater than 50% respiratory variability, suggesting right atrial pressure of 3 mmHg. FINDINGS  Left Ventricle: Left ventricular ejection fraction, by estimation, is 20 to 25%. The left ventricle has severely decreased function. The left ventricle demonstrates regional wall motion abnormalities. The average left ventricular global longitudinal strain is -5.7 %. Strain was performed and the global longitudinal strain is abnormal. The left ventricular internal cavity size was normal in size. There is mild concentric left ventricular hypertrophy.  LV Wall Scoring: The entire inferior wall, mid inferoseptal segment, and basal inferoseptal segment are akinetic. The entire anterior wall, entire lateral wall, entire anterior septum, and apex are hypokinetic. Right Ventricle: The right ventricular size is normal. No increase in right ventricular wall thickness. Right ventricular systolic function is normal. There is normal pulmonary artery systolic pressure. The tricuspid regurgitant velocity is 2.23 m/s,  and  with an assumed right atrial pressure of 3 mmHg, the estimated right ventricular systolic pressure is 22.9 mmHg. Left Atrium: Left atrial size was severely dilated. Right Atrium: Right atrial size was normal in size. Pericardium: A small pericardial effusion is present. The pericardial effusion is localized near the right atrium. Mitral Valve: The mitral valve is normal in structure. Mild mitral valve regurgitation. No evidence of mitral valve stenosis. MV peak gradient, 1.8 mmHg. The mean mitral valve gradient is 1.0 mmHg. Tricuspid Valve: The tricuspid valve is normal in structure. Tricuspid valve regurgitation is trivial. No evidence of tricuspid stenosis. Aortic Valve: The aortic valve is tricuspid. Aortic valve  regurgitation is not visualized. No aortic stenosis is present. Aortic valve mean gradient measures 2.0 mmHg. Aortic valve peak gradient measures 3.2 mmHg. Aortic valve area, by VTI measures 1.76 cm. Pulmonic Valve: The pulmonic valve was normal in structure. Pulmonic valve regurgitation is not visualized. No evidence of pulmonic stenosis. Aorta: The aortic root is normal in size and structure. Venous: The inferior vena cava is normal in size with greater than 50% respiratory variability, suggesting right atrial pressure of 3 mmHg. IAS/Shunts: No atrial level shunt detected by color flow Doppler. Additional Comments: There is a large pleural effusion in the left lateral region.  LEFT VENTRICLE PLAX 2D LVIDd:         5.30 cm      Diastology LVIDs:         4.80 cm      LV e' medial:    4.57 cm/s LV PW:         1.20 cm      LV E/e' medial:  16.8 LV IVS:        1.10 cm      LV e' lateral:   3.68 cm/s LVOT diam:     1.70 cm      LV E/e' lateral: 20.9 LV SV:         27 LV SV Index:   14           2D Longitudinal Strain LVOT Area:     2.27 cm     2D Strain GLS (A4C):   -5.0 %                             2D Strain GLS (A3C):   -4.6 %                             2D Strain GLS (A2C):   -7.4 % LV Volumes  (MOD)            2D Strain GLS Avg:     -5.7 % LV vol d, MOD A2C: 240.0 ml LV vol d, MOD A4C: 161.0 ml LV vol s, MOD A2C: 193.0 ml LV vol s, MOD A4C: 131.0 ml LV SV MOD A2C:     47.0 ml LV SV MOD A4C:     161.0 ml LV SV MOD BP:      51.0 ml RIGHT VENTRICLE RV Basal diam:  4.00 cm RV Mid diam:    3.60 cm RV S prime:     8.78 cm/s TAPSE (M-mode): 1.8 cm LEFT ATRIUM              Index        RIGHT ATRIUM           Index LA diam:        4.50 cm  2.37 cm/m   RA Area:     17.00 cm LA Vol (A2C):   105.0 ml 55.34 ml/m  RA Volume:   48.30 ml  25.46 ml/m LA Vol (A4C):   81.2 ml  42.80 ml/m LA Biplane Vol: 91.1 ml  48.02 ml/m  AORTIC VALVE                    PULMONIC VALVE AV Area (Vmax):    1.69 cm     PV Vmax:          0.85 m/s AV Area (Vmean):  1.36 cm     PV Peak grad:     2.9 mmHg AV Area (VTI):     1.76 cm     PR End Diast Vel: 2.27 msec AV Vmax:           89.90 cm/s   RVOT Peak grad:   1 mmHg AV Vmean:          60.900 cm/s AV VTI:            0.156 m AV Peak Grad:      3.2 mmHg AV Mean Grad:      2.0 mmHg LVOT Vmax:         67.10 cm/s LVOT Vmean:        36.500 cm/s LVOT VTI:          0.121 m LVOT/AV VTI ratio: 0.78  AORTA Ao Root diam: 2.80 cm Ao Asc diam:  3.50 cm MITRAL VALVE               TRICUSPID VALVE MV Area (PHT): 4.99 cm    TR Peak grad:   19.9 mmHg MV Area VTI:   1.23 cm    TR Vmax:        223.00 cm/s MV Peak grad:  1.8 mmHg MV Mean grad:  1.0 mmHg    SHUNTS MV Vmax:       0.68 m/s    Systemic VTI:  0.12 m MV Vmean:      48.3 cm/s   Systemic Diam: 1.70 cm MV Decel Time: 152 msec MV E velocity: 77.00 cm/s MV A velocity: 47.20 cm/s MV E/A ratio:  1.63 MV A Prime:    3.4 cm/s Chilton Si MD Electronically signed by Chilton Si MD Signature Date/Time: 03/19/2023/2:19:15 PM    Final    CT CHEST WO CONTRAST Result Date: 03/18/2023 CLINICAL DATA:  Chronic dyspnea vomiting diarrhea EXAM: CT CHEST WITHOUT CONTRAST TECHNIQUE: Multidetector CT imaging of the chest was performed following the  standard protocol without IV contrast. RADIATION DOSE REDUCTION: This exam was performed according to the departmental dose-optimization program which includes automated exposure control, adjustment of the mA and/or kV according to patient size and/or use of iterative reconstruction technique. COMPARISON:  Chest x-ray 03/18/2023 FINDINGS: Cardiovascular: Limited evaluation without intravenous contrast. Moderate aortic atherosclerosis. No aneurysm. Coronary vascular calcification. Cardiomegaly. Small pericardial effusion. Diffuse fluid in the mediastinum. Right-sided vascular catheter tip in the right atrium. Mediastinum/Nodes: Patent trachea. No thyroid mass. Fluid-filled esophagus. No suspicious lymph nodes Lungs/Pleura: Moderate to large bilateral pleural effusions. Emphysema. Probable scarring at the right apex. Partial lower lobe consolidations. Upper Abdomen: Large volume ascites Musculoskeletal: No acute or suspicious osseous abnormality. Extensive subcutaneous and soft tissue edema IMPRESSION: 1. Moderate to large bilateral pleural effusions with partial lower lobe consolidations, probable passive atelectasis. 2. Cardiomegaly with small pericardial effusion and mild diffuse fluid in the mediastinum. 3. Large volume ascites. Extensive subcutaneous and soft tissue edema. 4. Aortic atherosclerosis. Aortic Atherosclerosis (ICD10-I70.0). Electronically Signed   By: Jasmine Pang M.D.   On: 03/18/2023 20:41   US Venous Img Lower Bilateral (DVT) Result Date: 03/18/2023 CLINICAL DATA:  Lower extremity edema EXAM: Bilateral LOWER EXTREMITY VENOUS DOPPLER ULTRASOUND TECHNIQUE: Gray-scale sonography with compression, as well as color and duplex ultrasound, were performed to evaluate the deep venous system(s) from the level of the common femoral vein through the popliteal and proximal calf veins. COMPARISON:  02/22/2023 FINDINGS: VENOUS Normal compressibility of the common femoral, superficial femoral, and popliteal  veins, as  well as the visualized calf veins. Visualized portions of profunda femoral vein and great saphenous vein unremarkable. No filling defects to suggest DVT on grayscale or color Doppler imaging. Doppler waveforms show normal direction of venous flow, normal respiratory plasticity and response to augmentation. OTHER Peripheral vascular disease with extensive atherosclerosis of visualized lower extremity arteries. Limitations: none IMPRESSION: Negative for acute lower extremity DVT. Peripheral vascular disease. Electronically Signed   By: Jasmine Pang M.D.   On: 03/18/2023 20:31   DG Chest Port 1 View Result Date: 03/18/2023 CLINICAL DATA:  Pleural effusion EXAM: PORTABLE CHEST 1 VIEW COMPARISON:  02/19/2023 FINDINGS: Single frontal view of the chest demonstrates stable right internal jugular dialysis catheter. Cardiac silhouette is enlarged but stable. There are persistent bilateral pleural effusions and bibasilar consolidation, left greater than right. No pneumothorax. No acute bony abnormalities. IMPRESSION: 1. Persistent bibasilar consolidation and bilateral effusions, left greater than right. Electronically Signed   By: Sharlet Salina M.D.   On: 03/18/2023 19:30   CT ABDOMEN PELVIS W CONTRAST Result Date: 03/18/2023 CLINICAL DATA:  Vomiting and diarrhea for 2 days, lower abdominal pain EXAM: CT ABDOMEN AND PELVIS WITH CONTRAST TECHNIQUE: Multidetector CT imaging of the abdomen and pelvis was performed using the standard protocol following bolus administration of intravenous contrast. RADIATION DOSE REDUCTION: This exam was performed according to the departmental dose-optimization program which includes automated exposure control, adjustment of the mA and/or kV according to patient size and/or use of iterative reconstruction technique. CONTRAST:  75mL OMNIPAQUE IOHEXOL 300 MG/ML  SOLN COMPARISON:  11/10/2022 FINDINGS: Lower chest: There are large bilateral pleural effusions with dependent compressive  atelectasis within the lower lobes. The heart is enlarged without pericardial effusion. Hepatobiliary: Gallbladder is unremarkable without cholelithiasis or cholecystitis. Relative hypertrophy of the left lobe liver and atrophy of the right lobe liver, which may signify underlying cirrhosis though no nodularity of the liver capsule is noted. No focal parenchymal liver abnormalities are identified. Pancreas: Stable pancreatic parenchymal atrophy most pronounced within the body and tail. No pancreatic duct dilation or acute inflammatory change. Spleen: Normal in size without focal abnormality. Adrenals/Urinary Tract: There is focal cortical scarring within the upper pole right kidney. No other focal renal abnormalities. No urinary tract calculi or obstructive uropathy. The adrenals are unremarkable. Bladder is decompressed, limiting its evaluation. Stomach/Bowel: Continued distended fluid-filled stomach. No bowel obstruction or ileus. Normal appendix right mid abdomen. No bowel wall thickening or inflammatory change. Vascular/Lymphatic: Extensive atherosclerosis of the abdominal aorta and its branches. No pathologic adenopathy. Reproductive: Prostate is unremarkable. Other: Large volume ascites throughout the abdomen and pelvis. There are bilateral inguinal hernias again noted, with ascites extending into the patent inguinal canals. No bowel herniation. No free intraperitoneal gas. Musculoskeletal: There are no acute or destructive bony abnormalities. Reconstructed images demonstrate no additional findings. IMPRESSION: 1. Large bilateral pleural effusions with dependent lower lobe compressive atelectasis. 2. Large volume ascites. 3. Bilateral inguinal hernias containing ascites. No bowel herniation. 4. Distended fluid-filled stomach again noted, which could reflect sequela of gastro paresis given history of diabetes. No evidence of bowel obstruction or ileus. 5.  Aortic Atherosclerosis (ICD10-I70.0). Electronically  Signed   By: Sharlet Salina M.D.   On: 03/18/2023 15:26     Assessment and Plan:   Acute on chronic systolic and diastolic CHF/NICM Bilateral pleural effusions ESRD on HD, M/W/F Large volume ascitics - presented with weakness, N/V and diarrhea after missing HD - last HD session was 3/10. He was not taking prescribed medications - CT  abd/pelvis showed large b/l pleural effusions with dependent lower lobe compressive atelectasis. CXR showed biateral effusions and bibasilar consolidation, L>R. Chest CT showed mod to larte b/l pleural effusions, small pericardial effusion, large volume ascites - Echo showed LVEF 20-25%, WMA, mild LVH, normal RVSF, severely dilated LA, small pericardial effusion, mild MR - HD today - PTA torsemide on non HD days - started on IV lasix 40mg  BID - PTA Coreg held - volume up on exam, low albumin likely contributing - continue with IV lasix and HD. Nephrology is following  Chest pain - patient reports intermittent chest pain, unable to provide accurate description - echo showed known reduced LVEF with WMA of entire inf wall, mid inferoseptal segment, and basal inferoseptal segment akinetic. Entire anterior wall, entire lateral wall, anterior septum and apex hypokinetic. - remote cath with no CAD - may be in the setting of volume overload - if chest pain continues can consider MPI  Acute gastroenteritis C Dif diagnosed 2/25 - diarrhea and vomiting resolved - still with some nausea - started on PPI  Hypotension - midodrine 10mg  TID - BPS good   For questions or updates, please contact [facility] Please consult [email or web address] for contact info under    Signed, Shelley Cocke David Stall, PA-C  03/21/2023 10:50 AM

## 2023-03-21 NOTE — Progress Notes (Signed)
 PT Cancellation Note  Patient Details Name: KHAM ZUCKERMAN MRN: [id number] DOB: 06/19/1955   Cancelled Treatment:    Reason Eval/Treat Not Completed: Patient at procedure or test/unavailable (Patient off the floor for hemodialysis this morning and now off the floor for head CT.)  Donna Bernard, PT, MPT  Ina Homes 03/21/2023, 2:52 PM

## 2023-03-22 ENCOUNTER — Encounter: Payer: 59 | Admitting: Cardiology

## 2023-03-22 DIAGNOSIS — Z789 Other specified health status: Secondary | ICD-10-CM

## 2023-03-22 DIAGNOSIS — Z7189 Other specified counseling: Secondary | ICD-10-CM | POA: Diagnosis not present

## 2023-03-22 DIAGNOSIS — N186 End stage renal disease: Secondary | ICD-10-CM | POA: Diagnosis not present

## 2023-03-22 DIAGNOSIS — I5023 Acute on chronic systolic (congestive) heart failure: Secondary | ICD-10-CM | POA: Diagnosis not present

## 2023-03-22 DIAGNOSIS — E8770 Fluid overload, unspecified: Secondary | ICD-10-CM | POA: Diagnosis not present

## 2023-03-22 LAB — BASIC METABOLIC PANEL
Anion gap: 11 (ref 5–15)
BUN: 49 mg/dL — ABNORMAL HIGH (ref 8–23)
CO2: 20 mmol/L — ABNORMAL LOW (ref 22–32)
Calcium: 8.7 mg/dL — ABNORMAL LOW (ref 8.9–10.3)
Chloride: 97 mmol/L — ABNORMAL LOW (ref 98–111)
Creatinine, Ser: 3.43 mg/dL — ABNORMAL HIGH (ref 0.61–1.24)
GFR, Estimated: 19 mL/min — ABNORMAL LOW (ref >60)
Glucose, Bld: 189 mg/dL — ABNORMAL HIGH (ref 70–99)
Potassium: 4.9 mmol/L (ref 3.5–5.1)
Sodium: 128 mmol/L — ABNORMAL LOW (ref 135–145)

## 2023-03-22 LAB — CBC
HCT: 34 % — ABNORMAL LOW (ref 39.0–52.0)
Hemoglobin: 11.2 g/dL — ABNORMAL LOW (ref 13.0–17.0)
MCH: 30.6 pg (ref 26.0–34.0)
MCHC: 32.9 g/dL (ref 30.0–36.0)
MCV: 92.9 fL (ref 80.0–100.0)
Platelets: 161 10*3/uL (ref 150–400)
RBC: 3.66 MIL/uL — ABNORMAL LOW (ref 4.22–5.81)
RDW: 15.5 % (ref 11.5–15.5)
WBC: 3.8 10*3/uL — ABNORMAL LOW (ref 4.0–10.5)
nRBC: 0 % (ref 0.0–0.2)

## 2023-03-22 LAB — GLUCOSE, CAPILLARY
Glucose-Capillary: 196 mg/dL — ABNORMAL HIGH (ref 70–99)
Glucose-Capillary: 173 mg/dL — ABNORMAL HIGH (ref 70–99)

## 2023-03-22 LAB — HEPATITIS B SURFACE ANTIBODY, QUANTITATIVE: Hep B S AB Quant (Post): 29.6 m[IU]/mL

## 2023-03-22 MED ORDER — GLYCOPYRROLATE 0.2 MG/ML IJ SOLN: 0.2000 mg | INTRAMUSCULAR | Status: DC | PRN

## 2023-03-22 MED ORDER — HALOPERIDOL LACTATE 2 MG/ML PO CONC: 0.5000 mg | ORAL | Status: DC | PRN

## 2023-03-22 MED ORDER — GLYCOPYRROLATE 1 MG PO TABS: 1.0000 mg | ORAL_TABLET | ORAL | Status: DC | PRN

## 2023-03-22 MED ORDER — BIOTENE DRY MOUTH MT LIQD: 15.0000 mL | OROMUCOSAL | Status: DC | PRN

## 2023-03-22 MED ORDER — HALOPERIDOL 0.5 MG PO TABS: 0.5000 mg | ORAL_TABLET | ORAL | Status: DC | PRN

## 2023-03-22 MED ORDER — LORAZEPAM 1 MG PO TABS: 1.0000 mg | ORAL_TABLET | ORAL | Status: DC | PRN

## 2023-03-22 MED ORDER — HALOPERIDOL LACTATE 5 MG/ML IJ SOLN: 0.5000 mg | INTRAMUSCULAR | Status: DC | PRN

## 2023-03-22 MED ORDER — POLYVINYL ALCOHOL 1.4 % OP SOLN: 1.0000 [drp] | Freq: Four times a day (QID) | OPHTHALMIC | Status: DC | PRN

## 2023-03-22 MED ORDER — HYDROMORPHONE HCL 1 MG/ML IJ SOLN
0.5000 mg | INTRAMUSCULAR | Status: DC | PRN
  Administered 2023-03-26 – 2023-03-27 (×2): 0.5 mg via INTRAVENOUS
  Filled 2023-03-22 (×2): qty 0.5

## 2023-03-22 MED ORDER — LORAZEPAM 2 MG/ML IJ SOLN: 1.0000 mg | INTRAMUSCULAR | Status: DC | PRN

## 2023-03-22 MED ORDER — LORAZEPAM 2 MG/ML PO CONC: 1.0000 mg | ORAL | Status: DC | PRN

## 2023-03-22 NOTE — Progress Notes (Signed)
 Daily Progress Note   Patient Name: Shawn [name]       Date: 03/22/2023 DOB: 01/01/1956  Age: 68 y.o. MRN#: [id number] Attending Physician: Gillis Santa, MD Primary Care Physician: [facility] Admit Date: 03/18/2023  Reason for Consultation/Follow-up: Establishing goals of care  Subjective: Notes and labs reviewed.  In to see patient.  He states he is tired and would like to stop dialysis and just be kept comfortable.  Returned to bedside when patient's sisters [name] and Verlon Au were present.   Sisters advised they have been kept updated by the primary team and understand his status.  Questions answered.    We discussed his diagnosis, prognosis, GOC, EOL wishes disposition and options.  Created space and opportunity for patient  to explore thoughts and feelings regarding current medical information.   A detailed discussion was had today regarding advanced directives.  Concepts specific to code status, artifical feeding and hydration, IV antibiotics and rehospitalization were discussed.  The difference between an aggressive medical intervention path and a comfort care path was discussed.  Values and goals of care important to patient and family were attempted to be elicited.  Discussed limitations of medical interventions to prolong quality of life in some situations and discussed the concept of human mortality.  Patient confirms that he would like to stop dialysis and shift to comfort care.  We discussed his diagnoses.  Discussed that with missing single dialysis treatments, he becomes extremely short of breath and fluid overloaded, requiring admissions to the hospital.  I completed a MOST form today with the patient while 2 sisters were present and the signed original was  placed in the chart. Each section of options on the form were reviewed in full detail and any questions were answered as needed. The form was scanned and sent to medical records for it to be uploaded under ACP tab in Epic. A photocopy was also placed in the chart to be scanned into EMR. The patient outlined their wishes for the following treatment decisions:  Cardiopulmonary Resuscitation: Do Not Attempt Resuscitation (DNR/No CPR)  Medical Interventions: Comfort Measures: Keep clean, warm, and dry. Use medication by any route, positioning, wound care, and other measures to relieve pain and suffering. Use oxygen, suction and manual treatment of airway obstruction as needed for comfort. Do not transfer  to the hospital unless comfort needs cannot be met in current location.  Antibiotics: No antibiotics (use other measures to relieve symptoms)  IV Fluids: No IV fluids (provide other measures to ensure comfort)  Feeding Tube: No feeding tube     Length of Stay: 4  Current Medications: Scheduled Meds:   atorvastatin  20 mg Oral QHS   calcium acetate  667 mg Oral TID WC   Chlorhexidine Gluconate Cloth  6 each Topical Q0600   furosemide  40 mg Intravenous BID   [START ON 03/24/2023] furosemide  40 mg Intravenous BID   heparin  5,000 Units Subcutaneous Q8H   insulin aspart  0-9 Units Subcutaneous TID WC   midodrine  10 mg Oral TID   pantoprazole  40 mg Oral BID   sodium chloride flush  3 mL Intravenous Q12H   sodium chloride flush  3 mL Intravenous Q12H    Continuous Infusions:  anticoagulant sodium citrate      PRN Meds: acetaminophen **OR** acetaminophen, alteplase, anticoagulant sodium citrate, feeding supplement (NEPRO CARB STEADY), heparin, heparin, ipratropium-albuterol, lidocaine-prilocaine, ondansetron **OR** ondansetron (ZOFRAN) IV, pentafluoroprop-tetrafluoroeth, sodium chloride flush  Physical Exam Pulmonary:     Effort: Pulmonary effort is normal.     Comments: Moist cough  noted Neurological:     Mental Status: He is alert.             Vital Signs: BP 114/83 (BP Location: Left Arm)   Pulse 65   Temp 98 F (36.7 C)   Resp 18   Ht 5\' 11"  (1.803 m)   Wt 65 kg   SpO2 100%   BMI 19.99 kg/m  SpO2: SpO2: 100 % O2 Device: O2 Device: Room Air O2 Flow Rate:    Intake/output summary:  Intake/Output Summary (Last 24 hours) at 03/22/2023 1411 Last data filed at 03/22/2023 1131 Gross per 24 hour  Intake 120 ml  Output --  Net 120 ml   LBM: Last BM Date : 03/19/23 Baseline Weight: Weight: 70.8 kg Most recent weight: Weight: 65 kg     Patient Active Problem List   Diagnosis Date Noted   Volume overload 03/18/2023   Acquired hypothyroidism 02/22/2023   GI bleed 02/19/2023   Palliative care encounter 11/14/2022   Hypervolemia 11/10/2022   Diarrhea 11/10/2022   Diabetic ketoacidosis without coma associated with type 2 diabetes mellitus ([name]) 10/27/2022   Sepsis ([name]) 09/03/2022   HFrEF (heart failure with reduced ejection fraction) ([name]) 09/03/2022   Nausea and vomiting 08/30/2022   ESRD on dialysis ([name]) 08/29/2022   Acute exacerbation of CHF (congestive heart failure) ([name]) 08/13/2021   Iron deficiency anemia 07/01/2021   Hyperosmolar hyperglycemic state (HHS) ([name]) 03/16/2020   Acute metabolic encephalopathy 03/16/2020   Elevated troponin 03/16/2020   HLD (hyperlipidemia) 03/16/2020   Abnormal CXR 03/16/2020   Hypothermia 03/16/2020   Hypotension 04/30/2019   Stroke ([name])    Protein-calorie malnutrition, severe ([name]) 04/25/2019   Hyperkalemia 04/23/2019   Acute kidney injury superimposed on CKD 3b ([name]) 04/23/2019   C. difficile colitis 04/23/2019   Hypoglycemia 09/22/2018   Essential hypertension 05/12/2018   Chronic HFrEF (heart failure with reduced ejection fraction) ([name]) 12/15/2017   NICM (nonischemic cardiomyopathy) ([name]) 05/13/2017   T2DM (type 2 diabetes mellitus) ([name]) 05/13/2017   Smoker 05/13/2017   Alcohol abuse 05/13/2017     Palliative Care Assessment & Plan    Recommendations/Plan: Patient would like hospice facility placement, and will require hospice facility placement for symptom management at end-of-life.  Team updated.  Code Status:    Code Status Orders  (From admission, onward)           Start     Ordered   03/20/23 1320  Do not attempt resuscitation (DNR)- Limited -Do Not Intubate (DNI)  (Code Status)  Continuous       Question Answer Comment  If pulseless and not breathing No CPR or chest compressions.   In Pre-Arrest Conditions (Patient Is Breathing and Has A Pulse) Do not intubate. Provide all appropriate non-invasive medical interventions. Avoid ICU transfer unless indicated or required.   Consent: Discussion documented in EHR or advanced directives reviewed      03/20/23 1319           Code Status History     Date Active Date Inactive Code Status Order ID Comments User Context   03/18/2023 1759 03/20/2023 1319 Full Code [id number]  Gillis Santa, MD ED   02/19/2023 1128 02/22/2023 1936 Full Code [id number]  Delfino Lovett, MD ED   11/10/2022 0831 11/16/2022 2317 Full Code [id number]  Floydene Flock, MD ED   10/27/2022 1450 11/01/2022 2228 Full Code [id number]  Floydene Flock, MD ED   09/03/2022 0853 09/09/2022 1920 Full Code [id number]  Floydene Flock, MD ED   08/28/2022 1840 09/01/2022 0124 Full Code [id number]  Erin Fulling, MD ED   08/13/2021 1721 08/15/2021 1848 Full Code [id number]  Cox, Amy N, DO ED   06/23/2021 1735 07/07/2021 1820 Full Code [id number]  Darlin Priestly, MD ED   05/11/2021 1426 05/14/2021 1728 Full Code [id number]  Kathrynn Running, MD ED   01/21/2021 1233 01/25/2021 2312 Full Code [id number]  Cipriano Bunker, MD ED   01/05/2021 1648 01/09/2021 2031 Full Code [id number]  Venora Maples, MD ED   03/16/2020 1219 03/18/2020 2021 Full Code [id number]  Lorretta Harp, MD ED   04/30/2019 1320 05/01/2019 2022 Full Code [id number]  Lorretta Harp, MD ED   04/23/2019 1721 04/26/2019 1751 Full Code  [id number]  Eddie North, MD ED   01/18/2019 1715 01/20/2019 2033 Full Code [id number]  Enedina Finner, MD Inpatient   09/22/2018 1946 09/23/2018 1917 Full Code [id number]  Altamese Dilling, MD Inpatient   08/08/2018 0047 08/09/2018 1841 Full Code [id number]  Enedina Finner, MD Inpatient   03/21/2018 0602 03/24/2018 1625 Full Code [id number]  Mansy, Vernetta Honey, MD ED   12/15/2017 2358 12/17/2017 1933 Full Code [id number]  Oralia Manis, MD Inpatient   04/27/2017 0531 04/29/2017 2118 Full Code [id number]  Arnaldo Natal Inpatient      Advance Directive Documentation    Flowsheet Row Most Recent Value  Type of Advance Directive Healthcare Power of Attorney, Living will  Pre-existing out of facility DNR order (yellow form or pink MOST form) --  "MOST" Form in Place? --       Prognosis:  < 2 weeks  Care plan was discussed with team via epic chat  Thank you for allowing the Palliative Medicine Team to assist in the care of this patient.   Morton Stall, NP  Please contact Palliative Medicine Team phone at [phone] for questions and concerns.

## 2023-03-22 NOTE — Progress Notes (Signed)
 [facility] Liaison Note  Will follow up with patient tomorrow to re-evaluate for the Hospice Home.     Please don't hesitate to call with any Hospice related questions or concerns.    Thank you for the opportunity to participate in this patient's care.  [facility] Liaison [phone]

## 2023-03-22 NOTE — Progress Notes (Signed)
 [facility]  ROUNDING NOTE   Subjective:  Patient came in with complaints of nausea, vomiting, and diarrhea for 1 day prior to admission.  Also found to have volume overload and shortness of breath.  He was found to have bilateral pleural effusion and underwent left-sided thoracentesis yesterday and dialysis afterwards.    Patient is known to our practice and receives outpatient dialysis treatments at [facility] on a MWF schedule, supervised by Dr Thedore Mins.  Update:  Patient seen resting in bed Covers over head Denies pain or discomfort Small amount of breakfast consumed.   Objective:  Vital signs in last 24 hours:  Temp:  [98 F (36.7 C)] 98 F (36.7 C) (03/18 1451) Pulse Rate:  [60-66] 60 (03/18 1451) Resp:  [16-20] 18 (03/18 1451) BP: (113-124)/(77-91) 124/91 (03/18 1451) SpO2:  [100 %] 100 % (03/18 1451)  Weight change:  Filed Weights   03/20/23 0502 03/21/23 0810 03/21/23 1200  Weight: 65.3 kg 67.2 kg 65 kg    Intake/Output: I/O last 3 completed shifts: In: -  Out: 2000 [Other:2000]   Intake/Output this shift:  Total I/O In: 120 [P.O.:120] Out: -   Physical Exam: General: NAD  Head: Normocephalic, atraumatic. Moist oral mucosal membranes  Eyes: Anicteric  Neck: Supple, trachea midline  Lungs:  Clear to auscultation  Heart: Regular rate and rhythm  Abdomen:  Soft, nontender,   Extremities: 2+ bilateral lower extremity edema  Neurologic: Alert, moving all four extremities  Skin: No lesions  Access: Rt chest permcath    Basic Metabolic Panel: Recent Labs  Lab 03/18/23 0949 03/19/23 0821 03/20/23 0443 03/21/23 0544 03/22/23 0557  NA 138 132* 132* 133* 128*  K 4.5 5.2* 3.2* 5.7* 4.9  CL 100 100 95* 99 97*  CO2 24 22 24  21* 20*  GLUCOSE 172* 166* 134* 111* 189*  BUN 81* 83* 32* 59* 49*  CREATININE 5.04* 5.29* 2.14* 4.03* 3.43*  CALCIUM 8.8* 8.1* 8.5* 8.5* 8.7*  MG 1.9 1.9 1.9 1.9  --   PHOS 5.1* 5.5* 2.6 5.2*  --     Liver  Function Tests: Recent Labs  Lab 03/18/23 0949  AST 67*  ALT 37  ALKPHOS 118  BILITOT 0.7  PROT 6.4*  ALBUMIN 2.5*   Recent Labs  Lab 03/18/23 0949  LIPASE 21   No results for input(s): "AMMONIA" in the last 168 hours.  CBC: Recent Labs  Lab 03/18/23 1515 03/19/23 0821 03/20/23 0443 03/21/23 0544 03/21/23 1549 03/22/23 0557  WBC 4.2 5.7 4.4 4.3 3.8* 3.8*  NEUTROABS 3.4  --   --   --   --   --   HGB 13.3 14.1 13.0 11.5* 11.8* 11.2*  HCT 40.8 43.5 38.8* 34.8* 36.8* 34.0*  MCV 93.2 93.3 90.4 90.9 92.0 92.9  PLT 154 156 150 162 149* 161    Cardiac Enzymes: No results for input(s): "CKTOTAL", "CKMB", "CKMBINDEX", "TROPONINI" in the last 168 hours.  BNP: Invalid input(s): "POCBNP"  CBG: Recent Labs  Lab 03/21/23 1238 03/21/23 1726 03/21/23 2111 03/22/23 0840 03/22/23 1159  GLUCAP 87 104* 168* 196* 173*    Microbiology: Results for orders placed or performed during the hospital encounter of 03/18/23  Body fluid culture w Gram Stain     Status: None (Preliminary result)   Collection Time: 03/19/23  2:46 PM   Specimen: PATH Cytology Pleural fluid  Result Value Ref Range Status   Specimen Description   Final    PLEURAL FLUID Performed at [facility], [address] [place], [place] [address]    Special Requests   Final    NONE Performed at [facility], [address] [place], [place] [address]    Gram Stain   Final    RARE WBC PRESENT, PREDOMINANTLY MONONUCLEAR NO ORGANISMS SEEN    Culture   Final    NO GROWTH 3 DAYS Performed at [facility], [address] [place], [place] [address]    Report Status PENDING  Incomplete  MRSA Next Gen by PCR, Nasal     Status: None   Collection Time: 03/20/23  7:55 PM   Specimen: Nasal Mucosa; Nasal Swab  Result Value Ref Range Status   MRSA by PCR Next Gen NOT DETECTED NOT DETECTED Final    Comment: (NOTE) The GeneXpert MRSA Assay (FDA approved for NASAL specimens only), is one  component of a comprehensive MRSA colonization surveillance program. It is not intended to diagnose MRSA infection nor to guide or monitor treatment for MRSA infections. Test performance is not FDA approved in patients less than 47 years old. Performed at [facility], [address] [place], [place] [address]     Coagulation Studies: No results for input(s): "LABPROT", "INR" in the last 72 hours.  Urinalysis: No results for input(s): "COLORURINE", "LABSPEC", "PHURINE", "GLUCOSEU", "HGBUR", "BILIRUBINUR", "KETONESUR", "PROTEINUR", "UROBILINOGEN", "NITRITE", "LEUKOCYTESUR" in the last 72 hours.  Invalid input(s): "APPERANCEUR"    Imaging: CT HEAD WO CONTRAST ( ) Result Date: 03/21/2023 CLINICAL DATA:  Initial evaluation for acute neuro deficit, stroke suspected. EXAM: CT HEAD WITHOUT CONTRAST TECHNIQUE: Contiguous axial images were obtained from the base of the skull through the vertex without intravenous contrast. RADIATION DOSE REDUCTION: This exam was performed according to the departmental dose-optimization program which includes automated exposure control, adjustment of the mA and/or kV according to patient size and/or use of iterative reconstruction technique. COMPARISON:  CT from 01/21/2021. FINDINGS: Brain: Age-related cerebral atrophy with moderate chronic microvascular ischemic disease. No acute intracranial hemorrhage. No acute large vessel territory infarct. No mass lesion or midline shift. No hydrocephalus or extra-axial fluid collection. Vascular: No abnormal hyperdense vessel. Scattered vascular calcifications noted within the carotid siphons. Skull: Scalp soft tissues demonstrate no acute finding. Calvarium intact. Sinuses/Orbits: Globes and orbital soft tissues within normal limits. Visualized paranasal sinuses are largely clear. No mastoid effusion. Other: None. IMPRESSION: 1. No acute intracranial abnormality. 2. Age-related cerebral atrophy with moderate chronic  microvascular ischemic disease. Electronically Signed   By: Rise Mu M.D.   On: 03/21/2023 19:04     Medications:    anticoagulant sodium citrate      Chlorhexidine Gluconate Cloth  6 each Topical Q0600   furosemide  40 mg Intravenous BID   [START ON 03/24/2023] furosemide  40 mg Intravenous BID   midodrine  10 mg Oral TID   pantoprazole  40 mg Oral BID   sodium chloride flush  3 mL Intravenous Q12H   sodium chloride flush  3 mL Intravenous Q12H   acetaminophen **OR** acetaminophen, alteplase, anticoagulant sodium citrate, antiseptic oral rinse, glycopyrrolate **OR** glycopyrrolate **OR** glycopyrrolate, haloperidol **OR** haloperidol **OR** haloperidol lactate, HYDROmorphone (DILAUDID) injection, ipratropium-albuterol, lidocaine-prilocaine, LORazepam **OR** LORazepam **OR** LORazepam, ondansetron **OR** ondansetron (ZOFRAN) IV, pentafluoroprop-tetrafluoroeth, polyvinyl alcohol, sodium chloride flush  Assessment/ Plan:  68 y.o. male with end-stage renal disease on hemodialysis, hypertension, type 2 diabetes, congestive heart failure, coronary disease, history of stroke, history of alcohol abuse, tobacco abuse, hypothyroidism, ascites was admitted on 03/18/2023 for emesis and abdominal  pain.      #Volume overload, ascites #. ESRD-[employer], MWF, 61.5 kg, 195 minutes, CVC #Hypoalbuminemia Patient and family have decided to transition to comfort care. Will stop further dialysis at this time.   #. Anemia of CKD   Was receiving Mircera at outpatient clinic.    #. Secondary hyperparathyroidism of renal origin N 25.81  Lab Results  Component Value Date   CALCIUM 8.7 (L) 03/22/2023   PHOS 5.2 (H) 03/21/2023   Bone minerals acceptable    #C. difficile colitis Patient tested positive for C. difficile on 03/01/2023. He states that he received his antibiotics (oral vancomycin) from the [place] yesterday evening but has not started taking them. He will need reassessment  for ongoing/untreated C. difficile.    Due to transition to comfort care, we will sign off at this time.      #. Hyperkalemia Lab Results  Component Value Date   NA 128 (L) 03/22/2023   K 4.9 03/22/2023   CO2 20 (L) 03/22/2023   BUN 49 (H) 03/22/2023   CREATININE 3.43 (H) 03/22/2023   CALCIUM 8.7 (L) 03/22/2023   GLUCOSE 189 (H) 03/22/2023  Potassium a bit higher today at 5.7.  Dialyzing patient against a 2K bath.       LOS: 4 Shawn [name] 3/18/20253:01 PM

## 2023-03-22 NOTE — TOC Progression Note (Signed)
 Transition of Care [facility]) - Progression Note    Patient Details  Name: WARNELL RASNIC MRN: [id number] Date of Birth: 1955/05/02  Transition of Care [facility]) CM/SW Contact  Chapman Fitch, RN Phone Number: 03/22/2023, 3:24 PM  Clinical Narrative:     Crystal with Palliative has met with patient and sisters and confirmed they are interested in [facility] [place]. She made referral to Ree Kida with Civil engineer, contracting'  Expected Discharge Plan: Home/Self Care Barriers to Discharge: Continued Medical Work up  Expected Discharge Plan and Services       Living arrangements for the past 2 months: Single Family Home                                       Social Determinants of Health (SDOH) Interventions SDOH Screenings   Food Insecurity: No Food Insecurity (03/19/2023)  Housing: Low Risk  (03/19/2023)  Recent Concern: Housing - High Risk (02/20/2023)  Transportation Needs: No Transportation Needs (03/19/2023)  Recent Concern: Transportation Needs - Unmet Transportation Needs (02/20/2023)  Utilities: Not At Risk (03/19/2023)  Recent Concern: Utilities - At Risk (02/20/2023)  Financial Resource Strain: High Risk (08/24/2019)   Received from [facility], [facility]  Social Connections: Patient Declined (03/19/2023)  Recent Concern: Social Connections - Moderately Isolated (02/20/2023)  Stress: No Stress Concern Present (08/24/2019)   Received from [facility], [facility]  Tobacco Use: High Risk (03/18/2023)    Readmission Risk Interventions    03/20/2023   12:02 PM 02/22/2023   10:15 AM 09/04/2022   11:28 AM  Readmission Risk Prevention Plan  Transportation Screening Complete Complete Complete  PCP or Specialist Appt within 3-5 Days   Complete  HRI or Home Care Consult   Complete  Social Work Consult for Recovery Care Planning/Counseling   Complete  Palliative Care Screening   Not Applicable   Medication Review Oceanographer) Complete Complete Complete  PCP or Specialist appointment within 3-5 days of discharge Complete    HRI or Home Care Consult  Patient refused   SW Recovery Care/Counseling Consult Complete Complete   Palliative Care Screening Not Applicable    Skilled Nursing Facility Not Applicable Not Applicable

## 2023-03-22 NOTE — Progress Notes (Signed)
 [facility] Progress Note  Patient: Shawn [name]    BMW:[id number]  DOA: 03/18/2023     Date of Service: the patient was seen and examined on 03/22/2023  Chief Complaint  Patient presents with   Abdominal Pain   Emesis   Brief hospital course: EUGEAN ARNOTT is a 68 y.o. male with PMH of chronic combined systolic and diastolic congestive heart failure, HTN, CVA, IDDM T2, ESRD on HD MWF schedule, as reviewed from EMR, presented at [facility] ED with complaining of nausea vomiting and diarrhea for 1 day.  As per patient he had diarrhea yesterday but no diarrhea today.  Persistently having nausea and vomiting, could not eat today and did not take his home medications.  Patient was feeling very tired and not well so he did not go for hemodialysis, last hemodialysis was on Monday, 03/14/2023.  Patient reported dark stools and vomiting but no bright red blood noticed. Patient was feeling feeling cold, temperature was 94 in the ED, patient was given warm blankets, temperature improved.  Patient is feeling little bit improvement.  Denied any chest pain or palpitations, no headache or dizziness.   ED Course: VS temp 94 improved to 97.5 after warm blankets HR 67, RR 18, BP 141/85--- 110/74, 98% on room air BMP BG 172, BUN 81, creatinine 5.04 calcium 8.8, albumin 2.5, AST 67 CBC WBC 3.3--4.2, Hb 13.6 stable, PLT is 154 CT A/P: Large bilateral pleural effusion, lower lobe compressive atelectasis, large volume ascites.  Bilateral inguinal hernia, no obstruction or ileus. CXR: Persistent bibasilar consolidation and bilateral effusions, left greater than right.   Transition to comfort measures only on 03/22/2023  Continue comfort care medications.  DC'd active treatment, no labs. Follow hospice team for inpatient hospice placement when patient qualifies TOC following   During hospital stay patient was managed as below. Assessment and Plan: # Acute gastroenteritis, Resolved  Diarrhea and  vomiting resolved, still having some nausea Presented with weakness and decreased p.o. intake Continue oral hydration and soft diet Monitor electrolytes and replete Continue symptomatic treatment. S/p pantoprazole 40 mg p.o. twice daily, prior history of GI bleed possible Mallory-Weiss tear.  Currently H&H is stable. Patient was feeling weak and tired, so checked iron, folate, B12, vitamin D level   # Volume overload due to CHF and ESRD Presented with bilateral pleural effusion, large volume ascites and pericardial effusion S/p IV Lasix 40 mg BID, use lasix on non HD days Volume management with hemodialysis as per nephrology     # Bilateral pleural effusion due to CHF and ESRD Continue IV Lasix on hemodialysis days CT chest:  Moderate to large bilateral pleural effusions with partial lower lobe consolidations, probable passive atelectasis. Cardiomegaly with small pericardial effusion and mild diffuse fluid in the mediastinum. Large volume ascites. Extensive subcutaneous and soft tissue edema.  Aortic atherosclerosis. 3/15 s/p Left Thora 500 ml fluid was tapped    # Liver cirrhosis with large volume ascites, abdominal soft, no need of paracentesis at this time Continue IV Lasix on nondialysis days Fluid management by hemodialysis   # Combined chronic systolic and diastolic CHF exacerbation due to ESRD Lower extremity edema bilaterally, venous duplex negative for DVT Continue IV Lasix on nondialysis days Hold Coreg for now due to low blood pressure Fluid management by hemodialysis Continue fluid restriction 1.5 L/day TTE LVEF 20 to 25%, LV demonstrates regional wall motion abnormalities, severely enlarged LA, no significant valvular abnormality.  3/17 Cardio consulted, rec poor prognosis, agreed with palliative care  #  PVD, moderate  ABI 0.7 of lower Ex Vascular Sx rec so surgical intervention, as blood is not compromised and he is not in pain. As per pts sister he has not walked  for more than year now     # Hypotension Started midodrine 10 mg p.o. 3 times daily with holding parameters Monitor BP and titrate medication accordingly     # ESRD on HD MWF schedule Presented with volume overload, missed hemodialysis due to feeling sick, last hemodialysis was on 03/14/2023. Nephrology consulted, hemodialysis wil be done today  03/19/2023 Follow nephrology for further management   # Hypokalemia, due to diuresis Potassium repleted cautiously due to ESRD Monitor electrolytes and replete as needed  # HTN, HLD Blood pressure soft, held Coreg for now Continue statin Held aspirin for now   Goals of care discussion done on admission: 3/16 d/w POA pts sister over the phone and she stated that he is DNR/DNI 3/17 d/w pt sister, agreed with palliative care and may qualify for hospice care Palliative care consulted  Body mass index is 19.99 kg/m.  Interventions:  Pressure Injury 09/03/22 Coccyx Mid Stage 1 -  Intact skin with non-blanchable redness of a localized area usually over a bony prominence. (Active)  09/03/22 1743  Location: Coccyx  Location Orientation: Mid  Staging: Stage 1 -  Intact skin with non-blanchable redness of a localized area usually over a bony prominence.  Wound Description (Comments):   Present on Admission: Yes     Diet: Carb modified diet DVT Prophylaxis: Subcutaneous Heparin    Advance goals of care discussion: DNR/DNI  Family Communication: family was not present at bedside, at the time of interview.  The pt provided permission to discuss medical plan with the family. Opportunity was given to ask question and all questions were answered satisfactorily.  3/17 d/w pt's sister over the phone  Disposition:  Pt is from home, admitted with acute gastroenteritis, weakness, Volume overload, P.effusion and ascites, HD and IV diuresis, s/p Thoracentesis.  Overall prognosis is poor, palliative care consulted and discussed with the family as  well. Patient's sister who is POA agreed for palliative care, comfort measures only Hospice team was consulted, awaiting for inpatient hospice placement, patient will be reevaluated again tomorrow TOC following.   Subjective: No significant events overnight, pt was resting comfortably in the bed, denies any complaints, stated that he is feeling a lot better now.   Physical Exam: General: NAD, lying comfortably Appear in no distress, affect appropriate Eyes: PERRLA ENT: Oral Mucosa Clear, moist  Neck: no JVD,  Cardiovascular: S1 and S2 Present, no Murmur,  Respiratory: equal air entry b/l, decreased breath sounds b/l basal area, no wheezes Abdomen: Bowel Sound present, Soft and no tenderness,  Skin: no rashes Extremities: 2+ Pedal edema, no calf tenderness Neurologic: without any new focal findings Gait not checked due to patient safety concerns  Vitals:   03/21/23 1543 03/21/23 2000 03/22/23 0837 03/22/23 1451  BP: 118/77 113/79 114/83 (!) 124/91  Pulse: 66 65 65 60  Resp: 16 20 18 18   Temp:   98 F (36.7 C) 98 F (36.7 C)  TempSrc:      SpO2: 100% 100% 100% 100%  Weight:      Height:        Intake/Output Summary (Last 24 hours) at 03/22/2023 1656 Last data filed at 03/22/2023 1500 Gross per 24 hour  Intake 240 ml  Output --  Net 240 ml   Filed Weights   03/20/23 0502  03/21/23 0810 03/21/23 1200  Weight: 65.3 kg 67.2 kg 65 kg    Data Reviewed: I have personally reviewed and interpreted daily labs, tele strips, imagings as discussed above. I reviewed all nursing notes, pharmacy notes, vitals, pertinent old records I have discussed plan of care as described above with RN and patient/family.  CBC: Recent Labs  Lab 03/18/23 1515 03/19/23 0821 03/20/23 0443 03/21/23 0544 03/21/23 1549 03/22/23 0557  WBC 4.2 5.7 4.4 4.3 3.8* 3.8*  NEUTROABS 3.4  --   --   --   --   --   HGB 13.3 14.1 13.0 11.5* 11.8* 11.2*  HCT 40.8 43.5 38.8* 34.8* 36.8* 34.0*  MCV 93.2  93.3 90.4 90.9 92.0 92.9  PLT 154 156 150 162 149* 161   Basic Metabolic Panel: Recent Labs  Lab 03/18/23 0949 03/19/23 0821 03/20/23 0443 03/21/23 0544 03/22/23 0557  NA 138 132* 132* 133* 128*  K 4.5 5.2* 3.2* 5.7* 4.9  CL 100 100 95* 99 97*  CO2 24 22 24  21* 20*  GLUCOSE 172* 166* 134* 111* 189*  BUN 81* 83* 32* 59* 49*  CREATININE 5.04* 5.29* 2.14* 4.03* 3.43*  CALCIUM 8.8* 8.1* 8.5* 8.5* 8.7*  MG 1.9 1.9 1.9 1.9  --   PHOS 5.1* 5.5* 2.6 5.2*  --     Studies: No results found.   Scheduled Meds:  Chlorhexidine Gluconate Cloth  6 each Topical Q0600   furosemide  40 mg Intravenous BID   [START ON 03/24/2023] furosemide  40 mg Intravenous BID   midodrine  10 mg Oral TID   pantoprazole  40 mg Oral BID   sodium chloride flush  3 mL Intravenous Q12H   sodium chloride flush  3 mL Intravenous Q12H   Continuous Infusions:    PRN Meds: acetaminophen **OR** acetaminophen, alteplase, antiseptic oral rinse, glycopyrrolate **OR** glycopyrrolate **OR** glycopyrrolate, haloperidol **OR** haloperidol **OR** haloperidol lactate, HYDROmorphone (DILAUDID) injection, ipratropium-albuterol, lidocaine-prilocaine, LORazepam **OR** LORazepam **OR** LORazepam, ondansetron **OR** ondansetron (ZOFRAN) IV, pentafluoroprop-tetrafluoroeth, polyvinyl alcohol, sodium chloride flush  Time spent: 55 minutes  Author: Gillis Santa. MD [facility] 03/22/2023 4:56 PM  To reach On-call, see care teams to locate the attending and reach out to them via [email or web address]. If 7PM-7AM, please contact night-coverage If you still have difficulty reaching the attending provider, please page the [facility] (Director on Call) for [facility] on amion for assistance.

## 2023-03-22 NOTE — Plan of Care (Signed)

## 2023-03-22 NOTE — Progress Notes (Signed)
   Patient Name: Shawn [name] Date of Encounter: 03/22/2023 [facility] Cardiologist: Julien Nordmann, MD   Interval Summary  .    Patient reports he is feeling better. Volume status improving. Reports good UOP. He denies chest pain or SOB.  Vital Signs .    Vitals:   03/21/23 1200 03/21/23 1543 03/21/23 2000 03/22/23 0837  BP: 121/87 118/77 113/79 114/83  Pulse: 64 66 65 65  Resp: 16 16 20 18   Temp: 98.4 F (36.9 C)   98 F (36.7 C)  TempSrc: Oral     SpO2: 100% 100% 100% 100%  Weight: 65 kg     Height:        Intake/Output Summary (Last 24 hours) at 03/22/2023 0948 Last data filed at 03/21/2023 1200 Gross per 24 hour  Intake --  Output 2000 ml  Net -2000 ml      03/21/2023   12:00 PM 03/21/2023    8:10 AM 03/20/2023    5:02 AM  Last 3 Weights  Weight (lbs) 143 lb 4.8 oz 148 lb 2.4 oz 143 lb 15.4 oz  Weight (kg) 65 kg 67.2 kg 65.3 kg      Telemetry/ECG    NSR, HR 60s, PAC/PVCs - Personally Reviewed  Physical Exam .   GEN: No acute distress.   Neck: No JVD Cardiac: RRR, no murmurs, rubs, or gallops.  Respiratory: diminished breath sounds. GI: Soft, nontender, non-distended  MS: 1+ lower leg edema  Assessment & Plan .    Acute on chronic systolic and diastolic CHF/NICM Bilateral pleural effusions s/p L thoracentesis ESRD on HD, M/W/F Large volume ascitics - presented with weakness, N/V and diarrhea after missing HD - last HD session was 3/10. He was not taking prescribed medications - CT abd/pelvis showed large b/l pleural effusions with dependent lower lobe compressive atelectasis. CXR showed biateral effusions and bibasilar consolidation, L>R. Chest CT showed mod to larte b/l pleural effusions, small pericardial effusion, large volume ascites - Echo showed LVEF 20-25%, WMA, mild LVH, normal RVSF, severely dilated LA, small pericardial effusion, mild MR - HD on 3/15 and 3/17. He will have dialysis tomorrow - s/p Left thoracentesis 3/15  yielding - PTA torsemide on non HD days - started on IV lasix 40mg  BID - Net -3.7L - PTA Coreg held - volume up on exam, low albumin likely contributing - continue with IV lasix and HD. Nephrology is following   Acute gastroenteritis C Dif diagnosed 2/25 - diarrhea and vomiting resolved - still with some nausea - started on PPI   Hypotension - midodrine 10mg  TID - BPS good  GOC - overall poor prognosis - palliative care is following     For questions or updates, please contact [facility] Please consult [email or web address] for contact info under        Signed, Woodard Perrell David Stall, PA-C

## 2023-03-22 NOTE — Progress Notes (Signed)
 Physical Therapy Treatment Patient Details Name: Shawn [name] MRN: [id number] DOB: 1955/12/03 Today's Date: 03/22/2023   History of Present Illness Shawn [name] is a 68 y.o. male with PMH of chronic combined systolic and diastolic congestive heart failure, HTN, CVA, IDDM T2, ESRD on HD MWF schedule, as reviewed from EMR, presented at [facility] ED with complaining of nausea vomiting and diarrhea for 1 day. Persistently having nausea and vomiting, could not eat today and did not take his home medications.  Patient did not go for hemodialysis, last hemodialysis was on Monday, 03/14/2023.    PT Comments  Patient is agreeable to PT session with encouragement. Patient continues to require assistance for bed mobility. Max A for posterior scooting in bed. Standing tolerance limited for progression of standing. Recommend to continue PT to maximize independence and facilitate return to prior level of function. Anticipate patient will need rehabilitation < 3 hours/day after this hospital stay.    If plan is discharge home, recommend the following: Two people to help with walking and/or transfers;A lot of help with bathing/dressing/bathroom   Can travel by private vehicle     No  Equipment Recommendations  None recommended by PT    Recommendations for Other Services       Precautions / Restrictions Precautions Precautions: Fall Restrictions Weight Bearing Restrictions Per Provider Order: No     Mobility  Bed Mobility Overal bed mobility: Needs Assistance Bed Mobility: Supine to Sit, Sit to Supine     Supine to sit: Mod assist Sit to supine: Mod assist   General bed mobility comments: assistance for BLE and trunk support. cues for task initiation and sequencing    Transfers Overall transfer level: Needs assistance   Transfers: Bed to chair/wheelchair/BSC             General transfer comment: incremental posterior scooting performed with Max A with cues for technique.  limited activity tolerance for standing attempts and patient requesting to return to bed. anticipate patient may need +2 person assistance for standing    Ambulation/Gait                   Stairs             Wheelchair Mobility     Tilt Bed    Modified Rankin (Stroke Patients Only)       Balance Overall balance assessment: Needs assistance Sitting-balance support: Feet unsupported, No upper extremity supported Sitting balance-Leahy Scale: Fair Sitting balance - Comments: sitting tolerance of around 5 minutes with no loss of balance                                    Communication Communication Communication: Impaired Factors Affecting Communication: Reduced clarity of speech  Cognition Arousal: Lethargic Behavior During Therapy: WFL for tasks assessed/performed   PT - Cognitive impairments: No family/caregiver present to determine baseline                         Following commands: Intact      Cueing Cueing Techniques: Verbal cues  Exercises      General Comments        Pertinent Vitals/Pain Pain Assessment Pain Assessment: No/denies pain    Home Living                          Prior Function  PT Goals (current goals can now be found in the care plan section) Acute Rehab PT Goals Patient Stated Goal: none stated PT Goal Formulation: With patient Time For Goal Achievement: 04/03/23 Progress towards PT goals: Progressing toward goals    Frequency    Min 2X/week      PT Plan      Co-evaluation              AM-PAC PT "6 Clicks" Mobility   Outcome Measure  Help needed turning from your back to your side while in a flat bed without using bedrails?: A Little Help needed moving from lying on your back to sitting on the side of a flat bed without using bedrails?: A Lot Help needed moving to and from a bed to a chair (including a wheelchair)?: Total Help needed standing up from a  chair using your arms (e.g., wheelchair or bedside chair)?: Total Help needed to walk in hospital room?: Total Help needed climbing 3-5 steps with a railing? : Total 6 Click Score: 9    End of Session   Activity Tolerance: Patient limited by fatigue Patient left: in bed;with call bell/phone within reach Nurse Communication: Mobility status PT Visit Diagnosis: Other abnormalities of gait and mobility (R26.89);Muscle weakness (generalized) (M62.81)     Time: 8119-1478 PT Time Calculation (min) (ACUTE ONLY): 18 min  Charges:    $Therapeutic Activity: 8-22 mins PT General Charges $$ ACUTE PT VISIT: 1 Visit                     Donna Bernard, PT, MPT    Ina Homes 03/22/2023, 10:42 AM

## 2023-03-23 DIAGNOSIS — N186 End stage renal disease: Secondary | ICD-10-CM

## 2023-03-23 DIAGNOSIS — I5043 Acute on chronic combined systolic (congestive) and diastolic (congestive) heart failure: Secondary | ICD-10-CM

## 2023-03-23 DIAGNOSIS — Z992 Dependence on renal dialysis: Secondary | ICD-10-CM

## 2023-03-23 DIAGNOSIS — Z7189 Other specified counseling: Secondary | ICD-10-CM | POA: Diagnosis not present

## 2023-03-23 LAB — BODY FLUID CULTURE W GRAM STAIN

## 2023-03-23 NOTE — Hospital Course (Signed)
 Shawn [name] is a 68 y.o. male with PMH of chronic combined systolic and diastolic congestive heart failure, HTN, CVA, IDDM T2, ESRD on HD MWF schedule, as reviewed from EMR, presented at [facility] ED with complaining of nausea vomiting and diarrhea for 1 day.  Seen by nephrology, was placed on routine dialysis.  Symptoms have improved. Patient also was seen by palliative care, apparently patient and the family had decided not to continue hemodialysis, instead transition to comfort care.

## 2023-03-23 NOTE — Care Management Important Message (Signed)
 Important Message  Patient Details  Name: Shawn [name] MRN: [id number] Date of Birth: 21-Nov-1955   Important Message Given:  Yes - Medicare IM     Cristela Blue, CMA 03/23/2023, 1:18 PM

## 2023-03-23 NOTE — Progress Notes (Addendum)
  Progress Note   Patient: Shawn [name] [id number]:[id number] DOB: August 20, 1955 DOA: 03/18/2023     5 DOS: the patient was seen and examined on 03/23/2023   Brief hospital course: MIKLO AKEN is a 67 y.o. male with PMH of chronic combined systolic and diastolic congestive heart failure, HTN, CVA, IDDM T2, ESRD on HD MWF schedule, as reviewed from EMR, presented at [facility] ED with complaining of nausea vomiting and diarrhea for 1 day.  Seen by nephrology, was placed on routine dialysis.  Symptoms have improved. Patient also was seen by palliative care, apparently patient and the family had decided not to continue hemodialysis, instead transition to comfort care.   Principal Problem:   Volume overload Active Problems:   Medical orders for scope of treatment form in chart   Assessment and Plan: # Acute gastroenteritis, Resolved  Condition has improved, currently tolerating diet without nausea vomiting.  Diarrhea has resolved   # Volume overload due to CHF and ESRD # Bilateral pleural effusion due to CHF and ESRD  # Combined chronic systolic and diastolic CHF exacerbation due to ESRD Condition had improved after dialysis.  Now patient has requested to quit dialysis. Disability deterioration in the next few days, continue comfort care.  Hospice of the following, may transfer to inpatient hospice if condition further deteriorates.  # Liver cirrhosis with large volume ascites,  No treatment.   # PVD, moderate  Additional treatment.   # Hypotension  # ESRD on HD MWF schedule hypokalemia. He will no longer be dialyzed per request.   # HTN, HLD Medication on hold.  Pressure ulcer POA Location: Coccyx  Location Orientation: Mid  Staging: Stage 1 -  Intact skin with non-blanchable redness of a localized area usually over a bony prominence.  Wound Description (Comments):   Present on Admission: Yes    Hypoglycemia level 3.   Secondary to poor p.o. intake.   Subjective:  Patient  has better appetite today, no nausea vomiting or diarrhea.  Physical Exam: Vitals:   03/22/23 0837 03/22/23 1451 03/22/23 1953 03/23/23 0819  BP: 114/83 (!) 124/91 120/86 107/77  Pulse: 65 60 60 (!) 54  Resp: 18 18 12 16   Temp: 98 F (36.7 C) 98 F (36.7 C) 98.7 F (37.1 C) (!) 97 F (36.1 C)  TempSrc:      SpO2: 100% 100% 96% 99%  Weight:      Height:       General exam: Appears calm and comfortable  Respiratory system: Clear to auscultation. Respiratory effort normal. Cardiovascular system: S1 & S2 heard, RRR. No JVD, murmurs, rubs, gallops or clicks. No pedal edema. Gastrointestinal system: Abdomen is nondistended, soft and nontender. No organomegaly or masses felt. Normal bowel sounds heard. Central nervous system: Alert and oriented. No focal neurological deficits. Extremities: Symmetric 5 x 5 power. Skin: No rashes, lesions or ulcers Psychiatry: Judgement and insight appear normal. Mood & affect appropriate.    Data Reviewed:  Lab results reviewed.  Family Communication: None  Disposition: Status is: Inpatient Remains inpatient appropriate because: Severity of disease, comfort care.     Time spent: 35 minutes  Author: Marrion Coy, MD 03/23/2023 1:53 PM  For on call review [email or web address].

## 2023-03-23 NOTE — Progress Notes (Signed)
 Report called to 1C Bjorn Pippin, RN at [phone], awaiting transport to take patient. Transport here at 1426 to take patient to 129.

## 2023-03-23 NOTE — Progress Notes (Signed)
 Daily Progress Note   Patient Name: Shawn [name]       Date: 03/23/2023 DOB: May 20, 1955  Age: 68 y.o. MRN#: [id number] Attending Physician: Marrion Coy, MD Primary Care Physician: [facility]mit Date: 03/18/2023  Reason for Consultation/Follow-up: Establishing goals of care  Subjective: Notes reviewed.  In to see patient.  He is currently resting in bed at this time.  No family at bedside.  He denies complaint at this time.  He discusses that per his dialysis schedule, he would typically be planned for dialysis tomorrow.  Will monitor for symptom management needs.  Length of Stay: 5  Current Medications: Scheduled Meds:   Chlorhexidine Gluconate Cloth  6 each Topical Q0600   [START ON 03/24/2023] furosemide  40 mg Intravenous BID   midodrine  10 mg Oral TID   pantoprazole  40 mg Oral BID   sodium chloride flush  3 mL Intravenous Q12H   sodium chloride flush  3 mL Intravenous Q12H    Continuous Infusions:   PRN Meds: acetaminophen **OR** acetaminophen, alteplase, antiseptic oral rinse, glycopyrrolate **OR** glycopyrrolate **OR** glycopyrrolate, haloperidol **OR** haloperidol **OR** haloperidol lactate, HYDROmorphone (DILAUDID) injection, ipratropium-albuterol, lidocaine-prilocaine, LORazepam **OR** LORazepam **OR** LORazepam, ondansetron **OR** ondansetron (ZOFRAN) IV, pentafluoroprop-tetrafluoroeth, polyvinyl alcohol, sodium chloride flush  Physical Exam Pulmonary:     Effort: Pulmonary effort is normal.  Skin:    General: Skin is warm and dry.  Neurological:     Mental Status: He is alert.             Vital Signs: BP 107/77   Pulse (!) 54   Temp (!) 97 F (36.1 C)   Resp 16   Ht 5\' 11"  (1.803 m)   Wt 65 kg   SpO2 99%   BMI 19.99 kg/m  SpO2:  SpO2: 99 % O2 Device: O2 Device: Room Air O2 Flow Rate:    Intake/output summary:  Intake/Output Summary (Last 24 hours) at 03/23/2023 1204 Last data filed at 03/23/2023 1032 Gross per 24 hour  Intake 360 ml  Output --  Net 360 ml   LBM: Last BM Date : 03/23/23 Baseline Weight: Weight: 70.8 kg Most recent weight: Weight: 65 kg        Patient Active Problem List   Diagnosis Date Noted   Medical orders for scope of  treatment form in chart 03/22/2023   Volume overload 03/18/2023   Acquired hypothyroidism 02/22/2023   GI bleed 02/19/2023   Palliative care encounter 11/14/2022   Hypervolemia 11/10/2022   Diarrhea 11/10/2022   Diabetic ketoacidosis without coma associated with type 2 diabetes mellitus ([name]) 10/27/2022   Sepsis ([name]) 09/03/2022   HFrEF (heart failure with reduced ejection fraction) ([name]) 09/03/2022   Nausea and vomiting 08/30/2022   ESRD on dialysis ([name]) 08/29/2022   Acute exacerbation of CHF (congestive heart failure) ([name]) 08/13/2021   Iron deficiency anemia 07/01/2021   Hyperosmolar hyperglycemic state (HHS) ([name]) 03/16/2020   Acute metabolic encephalopathy 03/16/2020   Elevated troponin 03/16/2020   HLD (hyperlipidemia) 03/16/2020   Abnormal CXR 03/16/2020   Hypothermia 03/16/2020   Hypotension 04/30/2019   Stroke ([name])    Protein-calorie malnutrition, severe ([name]) 04/25/2019   Hyperkalemia 04/23/2019   Acute kidney injury superimposed on CKD 3b ([name]) 04/23/2019   C. difficile colitis 04/23/2019   Hypoglycemia 09/22/2018   Essential hypertension 05/12/2018   Chronic HFrEF (heart failure with reduced ejection fraction) ([name]) 12/15/2017   NICM (nonischemic cardiomyopathy) ([name]) 05/13/2017   T2DM (type 2 diabetes mellitus) ([name]) 05/13/2017   Smoker 05/13/2017   Alcohol abuse 05/13/2017    Palliative Care Assessment & Plan    Recommendations/Plan: Patient on comfort care.  Looking to hospice facility placement.  Code Status:    Code Status  Orders  (From admission, onward)           Start     Ordered   03/22/23 1419  Do not attempt resuscitation (DNR) - Comfort care  Continuous       Question Answer Comment  If patient has no pulse and is not breathing Do Not Attempt Resuscitation   In Pre-Arrest Conditions (Patient Is Breathing and Has a Pulse) Provide comfort measures. Relieve any mechanical airway obstruction. Avoid transfer unless required for comfort.   Consent: Discussion documented in EHR or advanced directives reviewed      03/22/23 1420           Code Status History     Date Active Date Inactive Code Status Order ID Comments User Context   03/20/2023 1319 03/22/2023 1418 Limited: Do not attempt resuscitation (DNR) -DNR-LIMITED -Do Not Intubate/DNI  [id number]  Gillis Santa, MD Inpatient   03/18/2023 1759 03/20/2023 1319 Full Code [id number]  Gillis Santa, MD ED   02/19/2023 1128 02/22/2023 1936 Full Code [id number]  Delfino Lovett, MD ED   11/10/2022 0831 11/16/2022 2317 Full Code [id number]  Floydene Flock, MD ED   10/27/2022 1450 11/01/2022 2228 Full Code [id number]  Floydene Flock, MD ED   09/03/2022 0853 09/09/2022 1920 Full Code [id number]  Floydene Flock, MD ED   08/28/2022 1840 09/01/2022 0124 Full Code [id number]  Erin Fulling, MD ED   08/13/2021 1721 08/15/2021 1848 Full Code [id number]  Cox, Amy N, DO ED   06/23/2021 1735 07/07/2021 1820 Full Code [id number]  Darlin Priestly, MD ED   05/11/2021 1426 05/14/2021 1728 Full Code [id number]  Kathrynn Running, MD ED   01/21/2021 1233 01/25/2021 2312 Full Code [id number]  Cipriano Bunker, MD ED   01/05/2021 1648 01/09/2021 2031 Full Code [id number]  Venora Maples, MD ED   03/16/2020 1219 03/18/2020 2021 Full Code [id number]  Lorretta Harp, MD ED   04/30/2019 1320 05/01/2019 2022 Full Code [id number]  Lorretta Harp, MD ED   04/23/2019 1721 04/26/2019 1751 Full Code [id number]  Eddie North, MD ED   01/18/2019  1715 01/20/2019 2033 Full Code [id number]  Enedina Finner, MD Inpatient   09/22/2018 1946  09/23/2018 1917 Full Code [id number]  Altamese Dilling, MD Inpatient   08/08/2018 0047 08/09/2018 1841 Full Code [id number]  Enedina Finner, MD Inpatient   03/21/2018 0602 03/24/2018 1625 Full Code [id number]  Arville Care, Vernetta Honey, MD ED   12/15/2017 2358 12/17/2017 1933 Full Code [id number]  Oralia Manis, MD Inpatient   04/27/2017 0531 04/29/2017 2118 Full Code [id number]  Arnaldo Natal Inpatient      Advance Directive Documentation    Flowsheet Row Most Recent Value  Type of Advance Directive Healthcare Power of Attorney, Living will  Pre-existing out of facility DNR order (yellow form or pink MOST form) --  "MOST" Form in Place? --     Thank you for allowing the Palliative Medicine Team to assist in the care of this patient.     Morton Stall, NP  Please contact Palliative Medicine Team phone at [phone] for questions and concerns.

## 2023-03-23 NOTE — Progress Notes (Signed)
  [facility] Liaison Note      Will continue to monitor for IPU appropriateness.  Patient remains inappropriate at this time.  Please call with any Hospice related questions or concerns.  Thank you for the opportunity to participate in this patient's care  [facility] Liaison [phone]

## 2023-03-24 ENCOUNTER — Encounter: Payer: 59 | Admitting: Cardiology

## 2023-03-24 DIAGNOSIS — I5043 Acute on chronic combined systolic (congestive) and diastolic (congestive) heart failure: Secondary | ICD-10-CM | POA: Diagnosis not present

## 2023-03-24 DIAGNOSIS — N186 End stage renal disease: Secondary | ICD-10-CM | POA: Diagnosis not present

## 2023-03-24 DIAGNOSIS — Z992 Dependence on renal dialysis: Secondary | ICD-10-CM | POA: Diagnosis not present

## 2023-03-24 DIAGNOSIS — Z7189 Other specified counseling: Secondary | ICD-10-CM | POA: Diagnosis not present

## 2023-03-24 NOTE — Progress Notes (Signed)
 Progress Note   Patient: Shawn [name] VWU:[id number] DOB: 02/19/1955 DOA: 03/18/2023     6 DOS: the patient was seen and examined on 03/24/2023   Brief hospital course: KWESI SANGHA is a 68 y.o. male with PMH of chronic combined systolic and diastolic congestive heart failure, HTN, CVA, IDDM T2, ESRD on HD MWF schedule, as reviewed from EMR, presented at [facility] ED with complaining of nausea vomiting and diarrhea for 1 day.  Seen by nephrology, was placed on routine dialysis.  Symptoms have improved. Patient also was seen by palliative care, apparently patient and the family had decided not to continue hemodialysis, instead transition to comfort care.   Principal Problem:   Volume overload Active Problems:   ESRD on dialysis ([name])   T2DM (type 2 diabetes mellitus) ([name])   Acute on chronic combined systolic and diastolic CHF (congestive heart failure) ([name])   Medical orders for scope of treatment form in chart   Assessment and Plan: # Acute gastroenteritis, Resolved  Condition has improved, currently tolerating diet without nausea vomiting.  Diarrhea has resolved   # Volume overload due to CHF and ESRD # Bilateral pleural effusion due to CHF and ESRD  # Combined chronic systolic and diastolic CHF exacerbation due to ESRD Condition had improved after dialysis.  Now patient has requested to quit dialysis. Disability deterioration in the next few days, continue comfort care.  Hospice of the following, may transfer to inpatient hospice if condition further deteriorates.   # Liver cirrhosis with large volume ascites,  No treatment.   # PVD, moderate  Additional treatment.   # Hypotension  # ESRD on HD MWF schedule hypokalemia. He will no longer be dialyzed per request.   # HTN, HLD Medication on hold.   Pressure ulcer POA Location: Coccyx  Location Orientation: Mid  Staging: Stage 1 -  Intact skin with non-blanchable redness of a localized area usually over a bony  prominence.  Wound Description (Comments):   Present on Admission: Yes    Hypoglycemia level 3.   Secondary to poor p.o. intake.    Comfortable today, discussed with patient sister, would prefer patient stay in the hospital until condition deteriorates, then transferred to inpatient hospice.  Discussed with inpatient hospice, they cannot take him today as he has not had much discomfort this time.  Keep him in the hospital for comfort care.    Subjective:  Has some baseline confusion, otherwise no discomfort.  Physical Exam: Vitals:   03/23/23 0819 03/23/23 1617 03/23/23 1954 03/24/23 0934  BP: 107/77 121/82 120/85 124/87  Pulse: (!) 54 (!) 58 61 64  Resp: 16 20  20   Temp: (!) 97 F (36.1 C) 97.8 F (36.6 C)  97.8 F (36.6 C)  TempSrc:    Oral  SpO2: 99% 97% 96% 100%  Weight:      Height:       General exam: Appears calm and comfortable  Respiratory system: Clear to auscultation. Respiratory effort normal. Cardiovascular system: S1 & S2 heard, RRR. No JVD, murmurs, rubs, gallops or clicks. No pedal edema. Gastrointestinal system: Abdomen is nondistended, soft and nontender. No organomegaly or masses felt. Normal bowel sounds heard. Central nervous system: Alert and oriented x1. No focal neurological deficits. Extremities: Symmetric 5 x 5 power. Skin: No rashes, lesions or ulcers Psychiatry:Mood & affect appropriate.    Data Reviewed:  There are no new results to review at this time.  Family Communication: Sister updated over the phone.  Disposition: Status  is: Inpatient Remains inpatient appropriate because: Comfort care.     Time spent: 35 minutes  Author: Marrion Coy, MD 03/24/2023 12:05 PM  For on call review [email or web address].

## 2023-03-24 NOTE — Plan of Care (Signed)
  Problem: Education: Goal: Ability to describe self-care measures that may prevent or decrease complications (Diabetes Survival Skills Education) will improve Outcome: Progressing   Problem: Coping: Goal: Ability to adjust to condition or change in health will improve Outcome: Progressing   Problem: Fluid Volume: Goal: Ability to maintain a balanced intake and output will improve Outcome: Progressing   Problem: Health Behavior/Discharge Planning: Goal: Ability to identify and utilize available resources and services will improve Outcome: Progressing Goal: Ability to manage health-related needs will improve Outcome: Progressing   Problem: Metabolic: Goal: Ability to maintain appropriate glucose levels will improve Outcome: Progressing   Problem: Nutritional: Goal: Maintenance of adequate nutrition will improve Outcome: Progressing Goal: Progress toward achieving an optimal weight will improve Outcome: Progressing   Problem: Skin Integrity: Goal: Risk for impaired skin integrity will decrease Outcome: Progressing   Problem: Education: Goal: Knowledge of General Education information will improve Description: Including pain rating scale, medication(s)/side effects and non-pharmacologic comfort measures Outcome: Progressing   Problem: Clinical Measurements: Goal: Ability to maintain clinical measurements within normal limits will improve Outcome: Progressing Goal: Will remain free from infection Outcome: Progressing Goal: Diagnostic test results will improve Outcome: Progressing Goal: Respiratory complications will improve Outcome: Progressing Goal: Cardiovascular complication will be avoided Outcome: Progressing   Problem: Activity: Goal: Risk for activity intolerance will decrease Outcome: Progressing   Problem: Coping: Goal: Level of anxiety will decrease Outcome: Progressing   Problem: Pain Managment: Goal: General experience of comfort will improve and/or  be controlled Outcome: Progressing   Problem: Safety: Goal: Ability to remain free from injury will improve Outcome: Progressing

## 2023-03-24 NOTE — Progress Notes (Signed)
 [facility] Liaison Note  Patient remains inappropriate for the Hospice Home.  HL will be happy to follow patient and re-evaluate if there are changes in the patient's condition.  Spoke with patient's sister today to update her.   Thank you for the opportunity to participate in this patient's care.  Please call with any Hospice related questions or concerns.     [facility] Liaison [phone]

## 2023-03-24 NOTE — Progress Notes (Signed)
 Daily Progress Note   Patient Name: Shawn [name]       Date: 03/24/2023 DOB: [birth year]  Age: 68 y.o. MRN#: [id number] Attending Physician: Marrion Coy, MD Primary Care Physician: [facility] Admit Date: 03/18/2023  Reason for Consultation/Follow-up: Establishing goals of care  Subjective: Notes and labs reviewed.  In to see patient.  He is currently resting in bed with sister at bedside.  He denies complaint at this time.  Family would like hospice facility placement, will monitor for symptoms.  Length of Stay: 6  Current Medications: Scheduled Meds:   furosemide  40 mg Intravenous BID   midodrine  10 mg Oral TID   pantoprazole  40 mg Oral BID   sodium chloride flush  3 mL Intravenous Q12H   sodium chloride flush  3 mL Intravenous Q12H    Continuous Infusions:   PRN Meds: acetaminophen **OR** acetaminophen, alteplase, antiseptic oral rinse, glycopyrrolate **OR** glycopyrrolate **OR** glycopyrrolate, haloperidol **OR** haloperidol **OR** haloperidol lactate, HYDROmorphone (DILAUDID) injection, ipratropium-albuterol, lidocaine-prilocaine, LORazepam **OR** LORazepam **OR** LORazepam, ondansetron **OR** ondansetron (ZOFRAN) IV, pentafluoroprop-tetrafluoroeth, polyvinyl alcohol, sodium chloride flush  Physical Exam Pulmonary:     Effort: Pulmonary effort is normal.  Neurological:     Mental Status: He is alert.             Vital Signs: BP 124/87   Pulse 64   Temp 97.8 F (36.6 C) (Oral)   Resp 20   Ht 5\' 11"  (1.803 m)   Wt 65 kg   SpO2 100%   BMI 19.99 kg/m  SpO2: SpO2: 100 % O2 Device: O2 Device: Nasal Cannula O2 Flow Rate:    Intake/output summary: No intake or output data in the 24 hours ending 03/24/23 1555 LBM: Last BM Date : 03/23/23 Baseline  Weight: Weight: 70.8 kg Most recent weight: Weight: 65 kg   Patient Active Problem List   Diagnosis Date Noted   Medical orders for scope of treatment form in chart 03/22/2023   Volume overload 03/18/2023   Acquired hypothyroidism 02/22/2023   GI bleed 02/19/2023   Palliative care encounter 11/14/2022   Hypervolemia 11/10/2022   Diarrhea 11/10/2022   Diabetic ketoacidosis without coma associated with type 2 diabetes mellitus ([name]) 10/27/2022   Sepsis ([name]) 09/03/2022   HFrEF (heart failure with reduced ejection fraction) ([name]) 09/03/2022  Nausea and vomiting 08/30/2022   ESRD on dialysis ([name]) 08/29/2022   Acute exacerbation of CHF (congestive heart failure) ([name]) 08/13/2021   Iron deficiency anemia 07/01/2021   Acute on chronic combined systolic and diastolic CHF (congestive heart failure) ([name]) 06/27/2021   Hyperosmolar hyperglycemic state (HHS) ([name]) 03/16/2020   Acute metabolic encephalopathy 03/16/2020   Elevated troponin 03/16/2020   HLD (hyperlipidemia) 03/16/2020   Abnormal CXR 03/16/2020   Hypothermia 03/16/2020   Hypotension 04/30/2019   Stroke ([name])    Protein-calorie malnutrition, severe ([name]) 04/25/2019   Hyperkalemia 04/23/2019   Acute kidney injury superimposed on CKD 3b ([name]) 04/23/2019   C. difficile colitis 04/23/2019   Hypoglycemia 09/22/2018   Essential hypertension 05/12/2018   Chronic HFrEF (heart failure with reduced ejection fraction) ([name]) 12/15/2017   NICM (nonischemic cardiomyopathy) ([name]) 05/13/2017   T2DM (type 2 diabetes mellitus) ([name]) 05/13/2017   Smoker 05/13/2017   Alcohol abuse 05/13/2017    Palliative Care Assessment & Plan     Recommendations/Plan: Patient on comfort care at this time.  Monitor for changes in symptom management needs. Family would like hospice facility placement.  Code Status:    Code Status Orders  (From admission, onward)           Start     Ordered   03/22/23 1419  Do not attempt resuscitation (DNR)  - Comfort care  Continuous       Question Answer Comment  If patient has no pulse and is not breathing Do Not Attempt Resuscitation   In Pre-Arrest Conditions (Patient Is Breathing and Has a Pulse) Provide comfort measures. Relieve any mechanical airway obstruction. Avoid transfer unless required for comfort.   Consent: Discussion documented in EHR or advanced directives reviewed      03/22/23 1420           Code Status History     Date Active Date Inactive Code Status Order ID Comments User Context   03/20/2023 1319 03/22/2023 1418 Limited: Do not attempt resuscitation (DNR) -DNR-LIMITED -Do Not Intubate/DNI  [id number]  Gillis Santa, MD Inpatient   03/18/2023 1759 03/20/2023 1319 Full Code [id number]  Gillis Santa, MD ED   02/19/2023 1128 02/22/2023 1936 Full Code [id number]  Delfino Lovett, MD ED   11/10/2022 0831 11/16/2022 2317 Full Code [id number]  Floydene Flock, MD ED   10/27/2022 1450 11/01/2022 2228 Full Code [id number]  Floydene Flock, MD ED   09/03/2022 0853 09/09/2022 1920 Full Code [id number]  Floydene Flock, MD ED   08/28/2022 1840 09/01/2022 0124 Full Code [id number]  Erin Fulling, MD ED   08/13/2021 1721 08/15/2021 1848 Full Code [id number]  Cox, Amy N, DO ED   06/23/2021 1735 07/07/2021 1820 Full Code [id number]  Darlin Priestly, MD ED   05/11/2021 1426 05/14/2021 1728 Full Code [id number]  Kathrynn Running, MD ED   01/21/2021 1233 01/25/2021 2312 Full Code [id number]  Cipriano Bunker, MD ED   01/05/2021 1648 01/09/2021 2031 Full Code [id number]  Venora Maples, MD ED   03/16/2020 1219 03/18/2020 2021 Full Code [id number]  Lorretta Harp, MD ED   04/30/2019 1320 05/01/2019 2022 Full Code [id number]  Lorretta Harp, MD ED   04/23/2019 1721 04/26/2019 1751 Full Code [id number]  Eddie North, MD ED   01/18/2019 1715 01/20/2019 2033 Full Code [id number]  Enedina Finner, MD Inpatient   09/22/2018 1946 09/23/2018 1917 Full Code [id number]  Altamese Dilling, MD Inpatient   08/08/2018 0047 08/09/2018 1841 Full Code [id number]   Enedina Finner, MD  Inpatient   03/21/2018 0602 03/24/2018 1625 Full Code [id number]  Arville Care Vernetta Honey, MD ED   12/15/2017 2358 12/17/2017 1933 Full Code [id number]  Oralia Manis, MD Inpatient   04/27/2017 0531 04/29/2017 2118 Full Code [id number]  Arnaldo Natal Inpatient      Advance Directive Documentation    Flowsheet Row Most Recent Value  Type of Advance Directive Healthcare Power of Attorney, Living will  Pre-existing out of facility DNR order (yellow form or pink MOST form) --  "MOST" Form in Place? --       Prognosis: Poor    Thank you for allowing the Palliative Medicine Team to assist in the care of this patient.   Morton Stall, NP  Please contact Palliative Medicine Team phone at [phone] for questions and concerns.

## 2023-03-25 DIAGNOSIS — R112 Nausea with vomiting, unspecified: Secondary | ICD-10-CM | POA: Diagnosis not present

## 2023-03-25 DIAGNOSIS — N186 End stage renal disease: Secondary | ICD-10-CM | POA: Diagnosis not present

## 2023-03-25 DIAGNOSIS — Z515 Encounter for palliative care: Secondary | ICD-10-CM | POA: Diagnosis not present

## 2023-03-25 DIAGNOSIS — Z992 Dependence on renal dialysis: Secondary | ICD-10-CM | POA: Diagnosis not present

## 2023-03-25 DIAGNOSIS — R197 Diarrhea, unspecified: Secondary | ICD-10-CM

## 2023-03-25 DIAGNOSIS — E8770 Fluid overload, unspecified: Secondary | ICD-10-CM | POA: Diagnosis not present

## 2023-03-25 DIAGNOSIS — I5043 Acute on chronic combined systolic (congestive) and diastolic (congestive) heart failure: Secondary | ICD-10-CM | POA: Diagnosis not present

## 2023-03-25 NOTE — Progress Notes (Signed)
  Progress Note   Patient: Shawn [name] [id number]:[id number] DOB: May 05, 1955 DOA: 03/18/2023     7 DOS: the patient was seen and examined on 03/25/2023   Brief hospital course: Shawn [name] is a 68 y.o. male with PMH of chronic combined systolic and diastolic congestive heart failure, HTN, CVA, IDDM T2, ESRD on HD MWF schedule, as reviewed from EMR, presented at [facility] ED with complaining of nausea vomiting and diarrhea for 1 day.  Seen by nephrology, was placed on routine dialysis.  Symptoms have improved. Patient also was seen by palliative care, apparently patient and the family had decided not to continue hemodialysis, instead transition to comfort care.   Principal Problem:   Volume overload Active Problems:   ESRD on dialysis ([name])   T2DM (type 2 diabetes mellitus) ([name])   Acute on chronic combined systolic and diastolic CHF (congestive heart failure) ([name])   Medical orders for scope of treatment form in chart   Assessment and Plan: # Acute gastroenteritis, Resolved  Condition has improved, currently tolerating diet without nausea vomiting.  Diarrhea has resolved   # Volume overload due to CHF and ESRD # Bilateral pleural effusion due to CHF and ESRD  # Combined chronic systolic and diastolic CHF exacerbation due to ESRD Condition had improved after dialysis.  Now patient has requested to quit dialysis. Disability deterioration in the next few days, continue comfort care.  Hospice of the following, may transfer to inpatient hospice if condition further deteriorates.   # Liver cirrhosis with large volume ascites,  No treatment.   # PVD, moderate  Additional treatment.   # Hypotension  # ESRD on HD MWF schedule hypokalemia. He will no longer be dialyzed per request.   # HTN, HLD Medication on hold.   Pressure ulcer POA Location: Coccyx  Location Orientation: Mid  Staging: Stage 1 -  Intact skin with non-blanchable redness of a localized area usually over a bony  prominence.  Wound Description (Comments):   Present on Admission: Yes    Hypoglycemia level 3.   Secondary to poor p.o. intake.   Patient is still comfortable, continue comfort care.    Subjective:  She has no complaint today.  Physical Exam: Vitals:   03/24/23 0934 03/24/23 1630 03/24/23 2028 03/25/23 0929  BP: 124/87 121/87 106/72 117/81  Pulse: 64 68 62 (!) 57  Resp: 20 20 18 16   Temp: 97.8 F (36.6 C)   98 F (36.7 C)  TempSrc: Oral     SpO2: 100% 98% 96% 98%  Weight:      Height:       General exam: Appears calm and comfortable  Respiratory system: Clear to auscultation. Respiratory effort normal. Cardiovascular system: S1 & S2 heard, RRR. No JVD, murmurs, rubs, gallops or clicks. No pedal edema. Gastrointestinal system: Abdomen is nondistended, soft and nontender. No organomegaly or masses felt. Normal bowel sounds heard. Central nervous system: Alert and oriented x2. No focal neurological deficits. Extremities: Symmetric 5 x 5 power. Skin: No rashes, lesions or ulcers Psychiatry: Mood & affect appropriate.    Data Reviewed:  There are no new results to review at this time.  Family Communication: None  Disposition: Status is: Inpatient Remains inpatient appropriate because: Comfort care.     Time spent: 25 minutes  Author: Marrion Coy, MD 03/25/2023 12:51 PM  For on call review [email or web address].

## 2023-03-25 NOTE — Progress Notes (Signed)
 Daily Progress Note   Patient Name: Shawn [name]       Date: 03/25/2023 DOB: [birth year]  Age: 68 y.o. MRN#: [id number] Attending Physician: Shawn Coy, MD [name] Date: 03/18/2023  Reason for Consultation/Follow-up: Establishing goals of care  HPI/Brief [name] Review:  Shawn [name] is a 68 y.o. male with PMH of chronic combined systolic and diastolic congestive heart failure, HTN, CVA, IDDM T2, ESRD on HD MWF schedule, as reviewed from EMR, presented at [facility] ED with complaining of nausea vomiting and diarrhea for 1 day.  As per patient he had diarrhea yesterday but no diarrhea today.  Persistently having nausea and vomiting, could not eat today and did not take his home medications.  Patient was feeling very tired and not well so he did not go for hemodialysis, last hemodialysis was on Monday, 03/14/2023.  Patient reported dark stools and vomiting but no bright red blood noticed. Patient was feeling feeling cold, temperature was 94 in the ED, patient was given warm blankets, temperature improved.  Patient is feeling little bit improvement.  Denied any chest pain or palpitations, no headache or dizziness.   Transitioned to Shawn [name] 3/18  Palliative Medicine consulted for assisting with goals of care conversations.  Subjective: Extensive chart review has been completed prior to meeting patient including labs, vital signs, imaging, progress notes, orders, and available advanced directive documents from current and previous encounters.    Visited with Shawn [name] at his bedside. He is awake, alert and able to engage in conversations. He is feeding himself breakfast. He reports a restful night, denies pain or any acute symptoms. No family at  bedside during time of visit.  Symptom burden low, review of MAR no recommendations to change or adjust regimen at this time.  [facility] hosp[facility] liaison continues to follow for disposition, not appropriate for IPU at this time.  PMT to follow peripherally, will follow up for any change in symptom management needs. [facility] hosp[facility] liaison to continue to follow for IPU eligibility.  Thank you for allowing the Palliative Medicine Team to assist in the care of this patient.  Total time:  25 minutes  Time spent includes: Detailed review of medical records (labs, imaging, vital signs), medically appropriate exam (mental status, respiratory, cardiac, skin), discussed with treatment team, counseling  and educating patient, family and staff, documenting clinical information, medication management and coordination of care.  Shawn Deed, DNP, AGNP-C Palliative Medicine   Please contact Palliative Medicine Team phone at [phone] for questions and concerns.

## 2023-03-25 NOTE — Progress Notes (Signed)
 [facility] Liaison Note  Follow up on referral for InPatient Hospice Unit [facility]) from earlier in the week.  Patient did not meet criteria for IPU.  Patient resting with eyes closed.  Arouses to verbal stimuli.  No family present.  Patient with no unmanaged symptoms.  Patient does not meet criteria for IPU today.  Thank you for allowing participation in this patient's care.  Norris Cross, RN Nurse Liaison [phone]

## 2023-03-26 DIAGNOSIS — E43 Unspecified severe protein-calorie malnutrition: Secondary | ICD-10-CM | POA: Diagnosis not present

## 2023-03-26 DIAGNOSIS — I5043 Acute on chronic combined systolic (congestive) and diastolic (congestive) heart failure: Secondary | ICD-10-CM | POA: Diagnosis not present

## 2023-03-26 DIAGNOSIS — Z992 Dependence on renal dialysis: Secondary | ICD-10-CM | POA: Diagnosis not present

## 2023-03-26 DIAGNOSIS — N186 End stage renal disease: Secondary | ICD-10-CM | POA: Diagnosis not present

## 2023-03-26 NOTE — Progress Notes (Signed)
 Contacted [facility] and left message for nurse liaison to discuss Pt. Current condition and IPU criteria.

## 2023-03-26 NOTE — Progress Notes (Signed)
 [facility] Liaison Note   Follow up on referral for InPatient Hospice Unit [facility]) from earlier in the week.  Patient did not meet criteria for IPU.   No family present.  Patient with no nmanaged symptoms.  Patient's po intake is diminishing.   Patient does not meet criteria for IPU today. No routine beds available.   Thank you for allowing participation in this patient's care.   Norris Cross, RN Nurse Liaison [phone]

## 2023-03-26 NOTE — Progress Notes (Signed)
 Progress Note   Patient: Shawn [name] ZOX:[id number] DOB: July 01, 1955 DOA: 03/18/2023     8 DOS: the patient was seen and examined on 03/26/2023   Brief hospital course: Shawn [name] is a 68 y.o. male with PMH of chronic combined systolic and diastolic congestive heart failure, HTN, CVA, IDDM T2, ESRD on HD MWF schedule, as reviewed from EMR, presented at [facility] ED with complaining of nausea vomiting and diarrhea for 1 day.  Seen by nephrology, was placed on routine dialysis.  Symptoms have improved. Patient also was seen by palliative care, apparently patient and the family had decided not to continue hemodialysis, instead transition to comfort care.   Principal Problem:   Volume overload Active Problems:   ESRD on dialysis ([name])   T2DM (type 2 diabetes mellitus) ([name])   Acute on chronic combined systolic and diastolic CHF (congestive heart failure) ([name])   Medical orders for scope of treatment form in chart   Assessment and Plan: # Acute gastroenteritis, Resolved  Condition has improved, currently tolerating diet without nausea vomiting.  Diarrhea has resolved   # Volume overload due to CHF and ESRD # Bilateral pleural effusion due to CHF and ESRD  # Combined chronic systolic and diastolic CHF exacerbation due to ESRD Condition had improved after dialysis.  Now patient has requested to quit dialysis. Disability deterioration in the next few days, continue comfort care.  Hospice of the following, may inpatient hospice inpatient hospice if condition further deteriorates.   # Liver cirrhosis with large volume ascites,  No treatment.   # PVD, moderate  Additional treatment.   # Hypotension  # ESRD on HD MWF schedule hypokalemia. He will no longer be dialyzed per request.   # HTN, HLD Medication on hold.   Pressure ulcer POA Location: Coccyx  Location Orientation: Mid  Staging: Stage 1 -  Intact skin with non-blanchable redness of a localized area usually over a  bony prominence.  Wound Description (Comments):   Present on Admission: Yes    Hypoglycemia level 3.   Severe protein calorie malnutrition. Secondary to poor p.o. intake.    Patient has very poor p.o. intake, but comfortable.  Waiting to transfer to inpatient hospice if condition deteriorates.  Will Foley catheter as patient still making some urine.     Subjective:  Has some confusion, no discomfort.  Physical Exam: Vitals:   03/24/23 2028 03/25/23 0929 03/25/23 1941 03/26/23 0856  BP: 106/72 117/81 100/73 120/83  Pulse: 62 (!) 57 64 62  Resp: 18 16 16 17   Temp:  98 F (36.7 C) 98.3 F (36.8 C) 98.1 F (36.7 C)  TempSrc:      SpO2: 96% 98% 90% 97%  Weight:      Height:       General exam: Appears calm and comfortable, cachectic  Respiratory system: Clear to auscultation. Respiratory effort normal. Cardiovascular system: S1 & S2 heard, RRR. No JVD, murmurs, rubs, gallops or clicks. No pedal edema. Gastrointestinal system: Abdomen is nondistended, soft and nontender. No organomegaly or masses felt. Normal bowel sounds heard. Central nervous system: Alert and oriented x2. No focal neurological deficits. Extremities: Severe muscle atrophy. Skin: No rashes, lesions or ulcers Psychiatry: Mood & affect appropriate.    Data Reviewed:  There are no new results to review at this time.  Family Communication: None  Disposition: Status is: Inpatient Remains inpatient appropriate because: Comfort care.     Time spent: 35 minutes  Author: Marrion Coy, MD 03/26/2023 9:49 AM  For on call review [email or web address].

## 2023-03-29 ENCOUNTER — Ambulatory Visit: Payer: 59

## 2023-04-05 NOTE — Death Summary Note (Signed)
 DEATH SUMMARY   Patient Details  Name: Shawn [name] MRN: [id number] DOB: 08-09-1955 WUJ:[id number], Ria Clock Medical Admission/Discharge Information   Admit Date:  04-10-2023  Date of Death: Date of Death: 04-19-23  Time of Death: Time of Death: 0642  Length of Stay: 9   Principle Cause of death: ESRD  Hospital Diagnoses: Principal Problem:   Volume overload Active Problems:   ESRD on dialysis ([name])   T2DM (type 2 diabetes mellitus) ([name])   Protein-calorie malnutrition, severe ([name])   Acute on chronic combined systolic and diastolic CHF (congestive heart failure) ([name])   Medical orders for scope of treatment form in chart   Hospital Course: Shawn [name] is a 68 y.o. male with PMH of chronic combined systolic and diastolic congestive heart failure, HTN, CVA, IDDM T2, ESRD on HD MWF schedule, as reviewed from EMR, presented at [facility] ED with complaining of nausea vomiting and diarrhea for 1 day.  Seen by nephrology, was placed on routine dialysis.  Symptoms have improved. Patient also was seen by palliative care, apparently patient and the family had decided not to continue hemodialysis, instead transition to comfort care. Patient passed in peace today.  Assessment and Plan:      # Acute gastroenteritis, Resolved  Condition has improved, currently tolerating diet without nausea vomiting.  Diarrhea has resolved   # Volume overload due to CHF and ESRD # Bilateral pleural effusion due to CHF and ESRD  # Combined chronic systolic and diastolic CHF exacerbation due to ESRD Condition had improved after dialysis.  Now patient has requested to quit dialysis. Disability deterioration in the next few days, continue comfort care.  Hospice of the following, was planning to transfer to inpatient hospice if becoming more symptomatic. Patient died in peace before able to transfer.    # Liver cirrhosis with large volume ascites,  No treatment.   # PVD, moderate  Additional  treatment.   # Hypotension  # ESRD on HD MWF schedule hypokalemia. He will no longer be dialyzed per request.   # HTN, HLD Medication on hold.   Pressure ulcer POA Location: Coccyx  Location Orientation: Mid  Staging: Stage 1 -  Intact skin with non-blanchable redness of a localized area usually over a bony prominence.  Wound Description (Comments):   Present on Admission: Yes    Hypoglycemia level 3.   Severe protein calorie malnutrition. Secondary to poor p.o. intake.    Procedures: HD  Consultations: Nephrology  The results of significant diagnostics from this hospitalization (including imaging, microbiology, ancillary and laboratory) are listed below for reference.   Significant Diagnostic Studies: CT HEAD WO CONTRAST ( ) Result Date: 03/21/2023 CLINICAL DATA:  Initial evaluation for acute neuro deficit, stroke suspected. EXAM: CT HEAD WITHOUT CONTRAST TECHNIQUE: Contiguous axial images were obtained from the base of the skull through the vertex without intravenous contrast. RADIATION DOSE REDUCTION: This exam was performed according to the departmental dose-optimization program which includes automated exposure control, adjustment of the mA and/or kV according to patient size and/or use of iterative reconstruction technique. COMPARISON:  CT from 01/21/2021. FINDINGS: Brain: Age-related cerebral atrophy with moderate chronic microvascular ischemic disease. No acute intracranial hemorrhage. No acute large vessel territory infarct. No mass lesion or midline shift. No hydrocephalus or extra-axial fluid collection. Vascular: No abnormal hyperdense vessel. Scattered vascular calcifications noted within the carotid siphons. Skull: Scalp soft tissues demonstrate no acute finding. Calvarium intact. Sinuses/Orbits: Globes and orbital soft tissues within normal limits. Visualized paranasal sinuses are largely  clear. No mastoid effusion. Other: None. IMPRESSION: 1. No acute intracranial  abnormality. 2. Age-related cerebral atrophy with moderate chronic microvascular ischemic disease. Electronically Signed   By: Rise Mu M.D.   On: 03/21/2023 19:04   US ARTERIAL ABI (SCREENING LOWER EXTREMITY) Result Date: 03/20/2023 CLINICAL DATA:  Lower extremity pain EXAM: NONINVASIVE PHYSIOLOGIC VASCULAR STUDY OF BILATERAL LOWER EXTREMITIES TECHNIQUE: Evaluation of both lower extremities were performed at rest, including calculation of ankle-brachial indices with single level Doppler, pressure and pulse volume recording. COMPARISON:  None Available. FINDINGS: Right ABI:  0.7 Left ABI:  0.7 Right Lower Extremity:  Abnormal monophasic arterial waveforms. Left Lower Extremity:  Abnormal monophasic arterial waveforms. 0.5-0.79 Moderate PAD IMPRESSION: Abnormal resting bilateral ankle-brachial indices of 0.7 consistent with at least moderate underlying peripheral arterial disease. Electronically Signed   By: Malachy Moan M.D.   On: 03/20/2023 15:19   DG Chest Port 1 View Result Date: 03/19/2023 CLINICAL DATA:  142230 Pleural effusion 142230 [id number] S/P thoracentesis [id number]. EXAM: PORTABLE CHEST 1 VIEW COMPARISON:  03/18/2023. FINDINGS: There is significant interval decrease in the left pleural effusion, status post thoracentesis. No pneumothorax. There are residual atelectatic changes at the left lung base. Bilateral lung fields are otherwise clear. Right lateral costophrenic angle is clear. Stable cardio-mediastinal silhouette. No acute osseous abnormalities. The soft tissues are within normal limits. Right IJ hemodialysis catheter noted with its tip overlying the cavoatrial junction region. IMPRESSION: Significant interval decrease in the left pleural effusion, status post thoracentesis. No pneumothorax. Electronically Signed   By: Jules Schick M.D.   On: 03/19/2023 14:59   US THORACENTESIS ASP PLEURAL SPACE W/IMG GUIDE Result Date: 03/19/2023 INDICATION: 45 you male with bilateral  pleural effusions on recent imaging. IR was requested for diagnostic and therapeutic thoracentesis. EXAM: ULTRASOUND GUIDED DIAGNOSTIC AND THERAPEUTIC LEFT-SIDED THORACENTESIS MEDICATIONS: 6 cc OF 1% lidocaine COMPLICATIONS: None immediate. PROCEDURE: An ultrasound guided thoracentesis was thoroughly discussed with the patient and questions answered. The benefits, risks, alternatives and complications were also discussed. The patient understands and wishes to proceed with the procedure. Written consent was obtained. Ultrasound was performed to localize and mark an adequate pocket of fluid in the left chest. The area was then prepped and draped in the normal sterile fashion. 1% Lidocaine was used for local anesthesia. Under ultrasound guidance a 6 Fr Safe-T-Centesis catheter was introduced. Thoracentesis was performed. The catheter was removed and a dressing applied. FINDINGS: A total of approximately 550 cc of clear, straw-colored pleural fluid was removed. Samples were sent to the laboratory as requested by the clinical team. Right thorax not amenable to thoracentesis at this time due to low volume on ultrasound. IMPRESSION: Successful ultrasound guided left thoracentesis yielding 500 cc of pleural fluid. Preformed by Buzzy Han, PA-C. Electronically Signed   By: Irish Lack M.D.   On: 03/19/2023 14:44   ECHOCARDIOGRAM COMPLETE Result Date: 03/19/2023    ECHOCARDIOGRAM REPORT   Patient Name:   TIARA BARTOLI Date of Exam: 03/19/2023 Medical Rec #:  [id number]           Height:       71.0 in Accession #:    [id number]          Weight:       156.0 lb Date of Birth:  1955-08-09            BSA:          1.897 m Patient Age:    18 years  BP:           121/82 mmHg Patient Gender: M                   HR:           57 bpm. Exam Location:  [facility] Procedure: 2D Echo, Cardiac Doppler, Color Doppler and Strain Analysis (Both            Spectral and Color Flow Doppler were utilized during procedure).  Indications:     CHF-Acute Systolic I50.21  History:         Patient has prior history of Echocardiogram examinations.                  Stroke; Risk Factors:Hypertension.  Sonographer:     Neysa Bonito Roar Referring Phys:  [id number] Gillis Santa Diagnosing Phys: Chilton Si MD  Sonographer Comments: Global longitudinal strain was attempted. IMPRESSIONS  1. Left ventricular ejection fraction, by estimation, is 20 to 25%. The left ventricle has severely decreased function. The left ventricle demonstrates regional wall motion abnormalities (see scoring diagram/findings for description). There is mild concentric left ventricular hypertrophy. The average left ventricular global longitudinal strain is -5.7 %. The global longitudinal strain is abnormal.  2. Right ventricular systolic function is normal. The right ventricular size is normal. There is normal pulmonary artery systolic pressure.  3. Left atrial size was severely dilated.  4. A small pericardial effusion is present. The pericardial effusion is localized near the right atrium. Large pleural effusion in the left lateral region.  5. The mitral valve is normal in structure. Mild mitral valve regurgitation. No evidence of mitral stenosis.  6. The aortic valve is tricuspid. Aortic valve regurgitation is not visualized. No aortic stenosis is present.  7. The inferior vena cava is normal in size with greater than 50% respiratory variability, suggesting right atrial pressure of 3 mmHg. FINDINGS  Left Ventricle: Left ventricular ejection fraction, by estimation, is 20 to 25%. The left ventricle has severely decreased function. The left ventricle demonstrates regional wall motion abnormalities. The average left ventricular global longitudinal strain is -5.7 %. Strain was performed and the global longitudinal strain is abnormal. The left ventricular internal cavity size was normal in size. There is mild concentric left ventricular hypertrophy.  LV Wall Scoring: The entire  inferior wall, mid inferoseptal segment, and basal inferoseptal segment are akinetic. The entire anterior wall, entire lateral wall, entire anterior septum, and apex are hypokinetic. Right Ventricle: The right ventricular size is normal. No increase in right ventricular wall thickness. Right ventricular systolic function is normal. There is normal pulmonary artery systolic pressure. The tricuspid regurgitant velocity is 2.23 m/s, and  with an assumed right atrial pressure of 3 mmHg, the estimated right ventricular systolic pressure is 22.9 mmHg. Left Atrium: Left atrial size was severely dilated. Right Atrium: Right atrial size was normal in size. Pericardium: A small pericardial effusion is present. The pericardial effusion is localized near the right atrium. Mitral Valve: The mitral valve is normal in structure. Mild mitral valve regurgitation. No evidence of mitral valve stenosis. MV peak gradient, 1.8 mmHg. The mean mitral valve gradient is 1.0 mmHg. Tricuspid Valve: The tricuspid valve is normal in structure. Tricuspid valve regurgitation is trivial. No evidence of tricuspid stenosis. Aortic Valve: The aortic valve is tricuspid. Aortic valve regurgitation is not visualized. No aortic stenosis is present. Aortic valve mean gradient measures 2.0 mmHg. Aortic valve peak gradient measures 3.2 mmHg. Aortic valve area, by VTI measures 1.76  cm. Pulmonic Valve: The pulmonic valve was normal in structure. Pulmonic valve regurgitation is not visualized. No evidence of pulmonic stenosis. Aorta: The aortic root is normal in size and structure. Venous: The inferior vena cava is normal in size with greater than 50% respiratory variability, suggesting right atrial pressure of 3 mmHg. IAS/Shunts: No atrial level shunt detected by color flow Doppler. Additional Comments: There is a large pleural effusion in the left lateral region.  LEFT VENTRICLE PLAX 2D LVIDd:         5.30 cm      Diastology LVIDs:         4.80 cm      LV e'  medial:    4.57 cm/s LV PW:         1.20 cm      LV E/e' medial:  16.8 LV IVS:        1.10 cm      LV e' lateral:   3.68 cm/s LVOT diam:     1.70 cm      LV E/e' lateral: 20.9 LV SV:         27 LV SV Index:   14           2D Longitudinal Strain LVOT Area:     2.27 cm     2D Strain GLS (A4C):   -5.0 %                             2D Strain GLS (A3C):   -4.6 %                             2D Strain GLS (A2C):   -7.4 % LV Volumes (MOD)            2D Strain GLS Avg:     -5.7 % LV vol d, MOD A2C: 240.0 ml LV vol d, MOD A4C: 161.0 ml LV vol s, MOD A2C: 193.0 ml LV vol s, MOD A4C: 131.0 ml LV SV MOD A2C:     47.0 ml LV SV MOD A4C:     161.0 ml LV SV MOD BP:      51.0 ml RIGHT VENTRICLE RV Basal diam:  4.00 cm RV Mid diam:    3.60 cm RV S prime:     8.78 cm/s TAPSE (M-mode): 1.8 cm LEFT ATRIUM              Index        RIGHT ATRIUM           Index LA diam:        4.50 cm  2.37 cm/m   RA Area:     17.00 cm LA Vol (A2C):   105.0 ml 55.34 ml/m  RA Volume:   48.30 ml  25.46 ml/m LA Vol (A4C):   81.2 ml  42.80 ml/m LA Biplane Vol: 91.1 ml  48.02 ml/m  AORTIC VALVE                    PULMONIC VALVE AV Area (Vmax):    1.69 cm     PV Vmax:          0.85 m/s AV Area (Vmean):   1.36 cm     PV Peak grad:     2.9 mmHg AV Area (VTI):     1.76 cm     PR End  Diast Vel: 2.27 msec AV Vmax:           89.90 cm/s   RVOT Peak grad:   1 mmHg AV Vmean:          60.900 cm/s AV VTI:            0.156 m AV Peak Grad:      3.2 mmHg AV Mean Grad:      2.0 mmHg LVOT Vmax:         67.10 cm/s LVOT Vmean:        36.500 cm/s LVOT VTI:          0.121 m LVOT/AV VTI ratio: 0.78  AORTA Ao Root diam: 2.80 cm Ao Asc diam:  3.50 cm MITRAL VALVE               TRICUSPID VALVE MV Area (PHT): 4.99 cm    TR Peak grad:   19.9 mmHg MV Area VTI:   1.23 cm    TR Vmax:        223.00 cm/s MV Peak grad:  1.8 mmHg MV Mean grad:  1.0 mmHg    SHUNTS MV Vmax:       0.68 m/s    Systemic VTI:  0.12 m MV Vmean:      48.3 cm/s   Systemic Diam: 1.70 cm MV Decel Time: 152  msec MV E velocity: 77.00 cm/s MV A velocity: 47.20 cm/s MV E/A ratio:  1.63 MV A Prime:    3.4 cm/s Chilton Si MD Electronically signed by Chilton Si MD Signature Date/Time: 03/19/2023/2:19:15 PM    Final    CT CHEST WO CONTRAST Result Date: 03/18/2023 CLINICAL DATA:  Chronic dyspnea vomiting diarrhea EXAM: CT CHEST WITHOUT CONTRAST TECHNIQUE: Multidetector CT imaging of the chest was performed following the standard protocol without IV contrast. RADIATION DOSE REDUCTION: This exam was performed according to the departmental dose-optimization program which includes automated exposure control, adjustment of the mA and/or kV according to patient size and/or use of iterative reconstruction technique. COMPARISON:  Chest x-ray 03/18/2023 FINDINGS: Cardiovascular: Limited evaluation without intravenous contrast. Moderate aortic atherosclerosis. No aneurysm. Coronary vascular calcification. Cardiomegaly. Small pericardial effusion. Diffuse fluid in the mediastinum. Right-sided vascular catheter tip in the right atrium. Mediastinum/Nodes: Patent trachea. No thyroid mass. Fluid-filled esophagus. No suspicious lymph nodes Lungs/Pleura: Moderate to large bilateral pleural effusions. Emphysema. Probable scarring at the right apex. Partial lower lobe consolidations. Upper Abdomen: Large volume ascites Musculoskeletal: No acute or suspicious osseous abnormality. Extensive subcutaneous and soft tissue edema IMPRESSION: 1. Moderate to large bilateral pleural effusions with partial lower lobe consolidations, probable passive atelectasis. 2. Cardiomegaly with small pericardial effusion and mild diffuse fluid in the mediastinum. 3. Large volume ascites. Extensive subcutaneous and soft tissue edema. 4. Aortic atherosclerosis. Aortic Atherosclerosis (ICD10-I70.0). Electronically Signed   By: Jasmine Pang M.D.   On: 03/18/2023 20:41   US Venous Img Lower Bilateral (DVT) Result Date: 03/18/2023 CLINICAL DATA:  Lower  extremity edema EXAM: Bilateral LOWER EXTREMITY VENOUS DOPPLER ULTRASOUND TECHNIQUE: Gray-scale sonography with compression, as well as color and duplex ultrasound, were performed to evaluate the deep venous system(s) from the level of the common femoral vein through the popliteal and proximal calf veins. COMPARISON:  02/22/2023 FINDINGS: VENOUS Normal compressibility of the common femoral, superficial femoral, and popliteal veins, as well as the visualized calf veins. Visualized portions of profunda femoral vein and great saphenous vein unremarkable. No filling defects to suggest DVT on grayscale or color Doppler imaging. Doppler  waveforms show normal direction of venous flow, normal respiratory plasticity and response to augmentation. OTHER Peripheral vascular disease with extensive atherosclerosis of visualized lower extremity arteries. Limitations: none IMPRESSION: Negative for acute lower extremity DVT. Peripheral vascular disease. Electronically Signed   By: Jasmine Pang M.D.   On: 03/18/2023 20:31   DG Chest Port 1 View Result Date: 03/18/2023 CLINICAL DATA:  Pleural effusion EXAM: PORTABLE CHEST 1 VIEW COMPARISON:  02/19/2023 FINDINGS: Single frontal view of the chest demonstrates stable right internal jugular dialysis catheter. Cardiac silhouette is enlarged but stable. There are persistent bilateral pleural effusions and bibasilar consolidation, left greater than right. No pneumothorax. No acute bony abnormalities. IMPRESSION: 1. Persistent bibasilar consolidation and bilateral effusions, left greater than right. Electronically Signed   By: Sharlet Salina M.D.   On: 03/18/2023 19:30   CT ABDOMEN PELVIS W CONTRAST Result Date: 03/18/2023 CLINICAL DATA:  Vomiting and diarrhea for 2 days, lower abdominal pain EXAM: CT ABDOMEN AND PELVIS WITH CONTRAST TECHNIQUE: Multidetector CT imaging of the abdomen and pelvis was performed using the standard protocol following bolus administration of intravenous  contrast. RADIATION DOSE REDUCTION: This exam was performed according to the departmental dose-optimization program which includes automated exposure control, adjustment of the mA and/or kV according to patient size and/or use of iterative reconstruction technique. CONTRAST:  75mL OMNIPAQUE IOHEXOL 300 MG/ML  SOLN COMPARISON:  11/10/2022 FINDINGS: Lower chest: There are large bilateral pleural effusions with dependent compressive atelectasis within the lower lobes. The heart is enlarged without pericardial effusion. Hepatobiliary: Gallbladder is unremarkable without cholelithiasis or cholecystitis. Relative hypertrophy of the left lobe liver and atrophy of the right lobe liver, which may signify underlying cirrhosis though no nodularity of the liver capsule is noted. No focal parenchymal liver abnormalities are identified. Pancreas: Stable pancreatic parenchymal atrophy most pronounced within the body and tail. No pancreatic duct dilation or acute inflammatory change. Spleen: Normal in size without focal abnormality. Adrenals/Urinary Tract: There is focal cortical scarring within the upper pole right kidney. No other focal renal abnormalities. No urinary tract calculi or obstructive uropathy. The adrenals are unremarkable. Bladder is decompressed, limiting its evaluation. Stomach/Bowel: Continued distended fluid-filled stomach. No bowel obstruction or ileus. Normal appendix right mid abdomen. No bowel wall thickening or inflammatory change. Vascular/Lymphatic: Extensive atherosclerosis of the abdominal aorta and its branches. No pathologic adenopathy. Reproductive: Prostate is unremarkable. Other: Large volume ascites throughout the abdomen and pelvis. There are bilateral inguinal hernias again noted, with ascites extending into the patent inguinal canals. No bowel herniation. No free intraperitoneal gas. Musculoskeletal: There are no acute or destructive bony abnormalities. Reconstructed images demonstrate no  additional findings. IMPRESSION: 1. Large bilateral pleural effusions with dependent lower lobe compressive atelectasis. 2. Large volume ascites. 3. Bilateral inguinal hernias containing ascites. No bowel herniation. 4. Distended fluid-filled stomach again noted, which could reflect sequela of gastro paresis given history of diabetes. No evidence of bowel obstruction or ileus. 5.  Aortic Atherosclerosis (ICD10-I70.0). Electronically Signed   By: Sharlet Salina M.D.   On: 03/18/2023 15:26    Microbiology: Recent Results (from the past 240 hours)  Body fluid culture w Gram Stain     Status: None   Collection Time: 03/19/23  2:46 PM   Specimen: PATH Cytology Pleural fluid  Result Value Ref Range Status   Specimen Description   Final    PLEURAL FLUID Performed at [facility], [address], [place], [place] [address]    Special Requests   Final  NONE Performed at [facility], [address] [place], [place] [address]    Gram Stain   Final    RARE WBC PRESENT, PREDOMINANTLY MONONUCLEAR NO ORGANISMS SEEN    Culture   Final    NO GROWTH 3 DAYS Performed at [facility], [address], [place], [place] [address]    Report Status 03/23/2023 FINAL  Final  MRSA Next Gen by PCR, Nasal     Status: None   Collection Time: 03/20/23  7:55 PM   Specimen: Nasal Mucosa; Nasal Swab  Result Value Ref Range Status   MRSA by PCR Next Gen NOT DETECTED NOT DETECTED Final    Comment: (NOTE) The GeneXpert MRSA Assay (FDA approved for NASAL specimens only), is one component of a comprehensive MRSA colonization surveillance program. It is not intended to diagnose MRSA infection nor to guide or monitor treatment for MRSA infections. Test performance is not FDA approved in patients less than 60 years old. Performed at [facility], [address], [place], [place] [address]     Time spent: no charge minutes  Signed: Marrion Coy, MD 03/12/2023

## 2023-04-05 NOTE — Progress Notes (Signed)
 Entered patients room to round. Patient deceased. Second RN, Molly Maduro, into room as second verification. Charge Nurse, Lupita Leash, notified.  Provider, Manuela Schwartz, notified.   Modena Morrow Nurse, notified Neurosurgeon, Maisie Fus.

## 2023-04-05 NOTE — Progress Notes (Addendum)
 Called [employer], [employer], spoke with Alverda Skeans., [phone]. All questions answered. Provided patients sister's information [name] Eoff-Franks [phone]. Provided funeral home name- [facility], [place], [place].   Reference # [id number]

## 2023-04-05 NOTE — Plan of Care (Signed)

## 2023-04-05 NOTE — Progress Notes (Signed)
 Entered patients room. Patient gagging. Patient nauseous. Medication administered see MAR.

## 2023-04-05 NOTE — Progress Notes (Signed)
 Reviewed chart for patients emergency contact. Contacted sister, Damaso Laday, notified of patients death. Sister gave the name of [facility] in [place] Sister   [phone]

## 2023-04-05 DEATH — deceased
